# Patient Record
Sex: Female | Born: 1950 | Race: White | Hispanic: No | Marital: Married
Health system: Midwestern US, Academic
[De-identification: ages and names within clinical notes are randomized; demographics above are authoritative.]

## PROBLEM LIST (undated history)

## (undated) DIAGNOSIS — G4733 Obstructive sleep apnea (adult) (pediatric): Secondary | ICD-10-CM

## (undated) DIAGNOSIS — J302 Other seasonal allergic rhinitis: Secondary | ICD-10-CM

## (undated) DIAGNOSIS — Z923 Personal history of irradiation: Secondary | ICD-10-CM

## (undated) DIAGNOSIS — Z531 Procedure and treatment not carried out because of patient's decision for reasons of belief and group pressure: Secondary | ICD-10-CM

## (undated) DIAGNOSIS — M545 Low back pain, unspecified: Secondary | ICD-10-CM

## (undated) DIAGNOSIS — Z9221 Personal history of antineoplastic chemotherapy: Secondary | ICD-10-CM

## (undated) DIAGNOSIS — Z9989 Dependence on other enabling machines and devices: Secondary | ICD-10-CM

## (undated) DIAGNOSIS — E669 Obesity, unspecified: Secondary | ICD-10-CM

## (undated) DIAGNOSIS — I10 Essential (primary) hypertension: Secondary | ICD-10-CM

## (undated) DIAGNOSIS — G8929 Other chronic pain: Secondary | ICD-10-CM

## (undated) DIAGNOSIS — I5032 Chronic diastolic (congestive) heart failure: Secondary | ICD-10-CM

## (undated) DIAGNOSIS — I2510 Atherosclerotic heart disease of native coronary artery without angina pectoris: Secondary | ICD-10-CM

## (undated) DIAGNOSIS — E119 Type 2 diabetes mellitus without complications: Secondary | ICD-10-CM

## (undated) DIAGNOSIS — I872 Venous insufficiency (chronic) (peripheral): Secondary | ICD-10-CM

## (undated) DIAGNOSIS — IMO0001 Reserved for inherently not codable concepts without codable children: Secondary | ICD-10-CM

## (undated) DIAGNOSIS — R011 Cardiac murmur, unspecified: Secondary | ICD-10-CM

## (undated) DIAGNOSIS — M1990 Unspecified osteoarthritis, unspecified site: Secondary | ICD-10-CM

## (undated) DIAGNOSIS — H269 Unspecified cataract: Secondary | ICD-10-CM

## (undated) DIAGNOSIS — D509 Iron deficiency anemia, unspecified: Secondary | ICD-10-CM

## (undated) DIAGNOSIS — K219 Gastro-esophageal reflux disease without esophagitis: Secondary | ICD-10-CM

## (undated) DIAGNOSIS — C50919 Malignant neoplasm of unspecified site of unspecified female breast: Secondary | ICD-10-CM

## (undated) DIAGNOSIS — K635 Polyp of colon: Secondary | ICD-10-CM

## (undated) DIAGNOSIS — E785 Hyperlipidemia, unspecified: Secondary | ICD-10-CM

## (undated) HISTORY — DX: Gastro-esophageal reflux disease without esophagitis: K21.9

## (undated) HISTORY — DX: Unspecified cataract: H26.9

## (undated) HISTORY — DX: Chronic diastolic (congestive) heart failure: I50.32

## (undated) HISTORY — DX: Other seasonal allergic rhinitis: J30.2

## (undated) HISTORY — DX: Obesity, unspecified: E66.9

## (undated) HISTORY — DX: Iron deficiency anemia, unspecified: D50.9

## (undated) HISTORY — DX: Hyperlipidemia, unspecified: E78.5

## (undated) HISTORY — DX: Malignant neoplasm of unspecified site of unspecified female breast: C50.919

## (undated) HISTORY — PX: COLONOSCOPY W/ BIOPSIES AND POLYPECTOMY: SHX1376

## (undated) HISTORY — DX: Type 2 diabetes mellitus without complications: E11.9

## (undated) HISTORY — DX: Other chronic pain: G89.29

## (undated) HISTORY — DX: Obstructive sleep apnea (adult) (pediatric): G47.33

## (undated) HISTORY — DX: Venous insufficiency (chronic) (peripheral): I87.2

## (undated) HISTORY — DX: Polyp of colon: K63.5

## (undated) HISTORY — DX: Unspecified osteoarthritis, unspecified site: M19.90

---
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
## 1983-10-05 HISTORY — PX: TOTAL ABDOMINAL HYSTERECTOMY: SHX209

## 1993-10-04 DIAGNOSIS — K635 Polyp of colon: Secondary | ICD-10-CM

## 1993-10-04 HISTORY — DX: Polyp of colon: K63.5

## 1999-03-17 ENCOUNTER — Ambulatory Visit (HOSPITAL_COMMUNITY): Admission: RE | Admit: 1999-03-17 | Discharge: 1999-03-17 | Payer: Self-pay | Admitting: *Deleted

## 1999-08-25 ENCOUNTER — Ambulatory Visit (HOSPITAL_COMMUNITY): Admission: RE | Admit: 1999-08-25 | Discharge: 1999-08-25 | Payer: Self-pay | Admitting: Cardiology

## 1999-08-26 ENCOUNTER — Encounter: Payer: Self-pay | Admitting: Cardiology

## 2002-06-07 ENCOUNTER — Encounter: Admission: RE | Admit: 2002-06-07 | Discharge: 2002-07-17 | Payer: Self-pay | Admitting: Cardiology

## 2002-09-28 ENCOUNTER — Encounter: Admission: RE | Admit: 2002-09-28 | Discharge: 2002-09-28 | Payer: Self-pay | Admitting: Internal Medicine

## 2002-09-28 ENCOUNTER — Encounter: Payer: Self-pay | Admitting: Internal Medicine

## 2002-10-14 ENCOUNTER — Emergency Department (HOSPITAL_COMMUNITY): Admission: EM | Admit: 2002-10-14 | Discharge: 2002-10-14 | Payer: Self-pay | Admitting: Emergency Medicine

## 2003-06-29 ENCOUNTER — Emergency Department (HOSPITAL_COMMUNITY): Admission: EM | Admit: 2003-06-29 | Discharge: 2003-06-29 | Payer: Self-pay

## 2003-09-10 ENCOUNTER — Ambulatory Visit (HOSPITAL_COMMUNITY): Admission: RE | Admit: 2003-09-10 | Discharge: 2003-09-10 | Payer: Self-pay | Admitting: Gastroenterology

## 2004-01-22 ENCOUNTER — Encounter: Admission: RE | Admit: 2004-01-22 | Discharge: 2004-01-22 | Payer: Self-pay | Admitting: Family Medicine

## 2004-10-04 DIAGNOSIS — C50919 Malignant neoplasm of unspecified site of unspecified female breast: Secondary | ICD-10-CM

## 2004-10-04 DIAGNOSIS — E1165 Type 2 diabetes mellitus with hyperglycemia: Secondary | ICD-10-CM

## 2004-10-04 DIAGNOSIS — E11319 Type 2 diabetes mellitus with unspecified diabetic retinopathy without macular edema: Secondary | ICD-10-CM

## 2004-10-04 HISTORY — PX: BREAST LUMPECTOMY: SHX2

## 2004-10-04 HISTORY — DX: Malignant neoplasm of unspecified site of unspecified female breast: C50.919

## 2004-10-04 HISTORY — PX: BREAST LUMPECTOMY W/ NEEDLE LOCALIZATION: SHX1266

## 2005-01-29 ENCOUNTER — Encounter: Admission: RE | Admit: 2005-01-29 | Discharge: 2005-01-29 | Payer: Self-pay | Admitting: Family Medicine

## 2005-02-16 ENCOUNTER — Encounter (INDEPENDENT_AMBULATORY_CARE_PROVIDER_SITE_OTHER): Payer: Self-pay | Admitting: Radiology

## 2005-02-16 ENCOUNTER — Encounter (INDEPENDENT_AMBULATORY_CARE_PROVIDER_SITE_OTHER): Payer: Self-pay | Admitting: *Deleted

## 2005-02-16 ENCOUNTER — Encounter: Admission: RE | Admit: 2005-02-16 | Discharge: 2005-02-16 | Payer: Self-pay | Admitting: Family Medicine

## 2005-03-05 ENCOUNTER — Encounter (HOSPITAL_COMMUNITY): Admission: RE | Admit: 2005-03-05 | Discharge: 2005-03-05 | Payer: Self-pay

## 2005-03-19 ENCOUNTER — Ambulatory Visit (HOSPITAL_BASED_OUTPATIENT_CLINIC_OR_DEPARTMENT_OTHER): Admission: RE | Admit: 2005-03-19 | Discharge: 2005-03-19 | Payer: Self-pay

## 2005-03-19 ENCOUNTER — Encounter: Admission: RE | Admit: 2005-03-19 | Discharge: 2005-03-19 | Payer: Self-pay

## 2005-03-19 ENCOUNTER — Encounter (INDEPENDENT_AMBULATORY_CARE_PROVIDER_SITE_OTHER): Payer: Self-pay | Admitting: *Deleted

## 2005-03-19 ENCOUNTER — Ambulatory Visit (HOSPITAL_COMMUNITY): Admission: RE | Admit: 2005-03-19 | Discharge: 2005-03-19 | Payer: Self-pay

## 2005-03-19 ENCOUNTER — Encounter (INDEPENDENT_AMBULATORY_CARE_PROVIDER_SITE_OTHER): Payer: Self-pay

## 2005-04-12 ENCOUNTER — Ambulatory Visit: Payer: Self-pay | Admitting: Oncology

## 2005-05-12 ENCOUNTER — Ambulatory Visit: Admission: RE | Admit: 2005-05-12 | Discharge: 2005-05-12 | Payer: Self-pay

## 2005-06-01 ENCOUNTER — Ambulatory Visit: Admission: RE | Admit: 2005-06-01 | Discharge: 2005-06-22 | Payer: Self-pay | Admitting: Radiation Oncology

## 2005-06-25 ENCOUNTER — Ambulatory Visit: Admission: RE | Admit: 2005-06-25 | Discharge: 2005-07-01 | Payer: Self-pay | Admitting: Radiation Oncology

## 2005-07-06 ENCOUNTER — Ambulatory Visit: Payer: Self-pay | Admitting: Oncology

## 2005-07-08 ENCOUNTER — Ambulatory Visit: Admission: RE | Admit: 2005-07-08 | Discharge: 2005-09-08 | Payer: Self-pay | Admitting: Radiation Oncology

## 2005-09-30 ENCOUNTER — Emergency Department (HOSPITAL_COMMUNITY): Admission: EM | Admit: 2005-09-30 | Discharge: 2005-09-30 | Payer: Self-pay | Admitting: Emergency Medicine

## 2005-10-11 ENCOUNTER — Ambulatory Visit: Payer: Self-pay | Admitting: Internal Medicine

## 2005-10-25 ENCOUNTER — Ambulatory Visit: Payer: Self-pay | Admitting: Family Medicine

## 2005-11-08 ENCOUNTER — Ambulatory Visit: Payer: Self-pay | Admitting: Family Medicine

## 2005-11-08 ENCOUNTER — Ambulatory Visit: Payer: Self-pay | Admitting: *Deleted

## 2005-12-01 ENCOUNTER — Ambulatory Visit: Payer: Self-pay | Admitting: Family Medicine

## 2005-12-06 ENCOUNTER — Ambulatory Visit: Payer: Self-pay | Admitting: Family Medicine

## 2005-12-07 ENCOUNTER — Ambulatory Visit: Payer: Self-pay | Admitting: Family Medicine

## 2005-12-09 ENCOUNTER — Ambulatory Visit: Payer: Self-pay | Admitting: Family Medicine

## 2005-12-15 ENCOUNTER — Ambulatory Visit: Payer: Self-pay | Admitting: Family Medicine

## 2005-12-28 ENCOUNTER — Ambulatory Visit: Payer: Self-pay | Admitting: Family Medicine

## 2006-01-03 ENCOUNTER — Ambulatory Visit: Payer: Self-pay | Admitting: Family Medicine

## 2006-01-20 ENCOUNTER — Encounter: Admission: RE | Admit: 2006-01-20 | Discharge: 2006-01-20 | Payer: Self-pay

## 2006-01-24 ENCOUNTER — Ambulatory Visit: Payer: Self-pay | Admitting: Internal Medicine

## 2006-03-02 ENCOUNTER — Encounter: Admission: RE | Admit: 2006-03-02 | Discharge: 2006-03-30 | Payer: Self-pay | Admitting: Internal Medicine

## 2006-03-04 ENCOUNTER — Ambulatory Visit: Payer: Self-pay | Admitting: Family Medicine

## 2006-03-28 ENCOUNTER — Ambulatory Visit: Payer: Self-pay | Admitting: Family Medicine

## 2006-03-30 ENCOUNTER — Ambulatory Visit: Admission: RE | Admit: 2006-03-30 | Discharge: 2006-05-25 | Payer: Self-pay | Admitting: Radiation Oncology

## 2006-04-25 ENCOUNTER — Ambulatory Visit: Payer: Self-pay | Admitting: Family Medicine

## 2006-08-04 DIAGNOSIS — I2510 Atherosclerotic heart disease of native coronary artery without angina pectoris: Secondary | ICD-10-CM

## 2006-08-04 HISTORY — DX: Atherosclerotic heart disease of native coronary artery without angina pectoris: I25.10

## 2006-08-30 ENCOUNTER — Inpatient Hospital Stay (HOSPITAL_COMMUNITY): Admission: EM | Admit: 2006-08-30 | Discharge: 2006-09-02 | Payer: Self-pay | Admitting: Emergency Medicine

## 2006-08-31 ENCOUNTER — Ambulatory Visit: Payer: Self-pay | Admitting: Cardiovascular Disease

## 2006-08-31 ENCOUNTER — Encounter: Payer: Self-pay | Admitting: Cardiovascular Disease

## 2006-09-07 ENCOUNTER — Ambulatory Visit (HOSPITAL_COMMUNITY): Admission: RE | Admit: 2006-09-07 | Discharge: 2006-09-07 | Payer: Self-pay | Admitting: Cardiology

## 2006-09-07 ENCOUNTER — Encounter: Payer: Self-pay | Admitting: Vascular Surgery

## 2006-09-15 ENCOUNTER — Ambulatory Visit: Payer: Self-pay | Admitting: Cardiology

## 2006-09-20 ENCOUNTER — Encounter (INDEPENDENT_AMBULATORY_CARE_PROVIDER_SITE_OTHER): Payer: Self-pay | Admitting: *Deleted

## 2006-09-20 ENCOUNTER — Ambulatory Visit: Payer: Self-pay | Admitting: Internal Medicine

## 2006-09-20 ENCOUNTER — Ambulatory Visit (HOSPITAL_COMMUNITY): Admission: RE | Admit: 2006-09-20 | Discharge: 2006-09-20 | Payer: Self-pay | Admitting: Cardiology

## 2006-09-20 LAB — CONVERTED CEMR LAB
AST: 15 units/L (ref 0–37)
Albumin: 4.5 g/dL (ref 3.5–5.2)
Alkaline Phosphatase: 78 units/L (ref 39–117)
Bilirubin Urine: NEGATIVE
Glucose, Bld: 159 mg/dL — ABNORMAL HIGH (ref 70–99)
Hemoglobin, Urine: NEGATIVE
Ketones, ur: NEGATIVE mg/dL
Microalb, Ur: 0.94 mg/dL (ref 0.00–1.89)
Potassium: 3.6 meq/L (ref 3.5–5.3)
Protein, ur: NEGATIVE mg/dL
Sodium: 142 meq/L (ref 135–145)
Total Protein: 7.9 g/dL (ref 6.0–8.3)
Urobilinogen, UA: 0.2 (ref 0.0–1.0)

## 2006-10-06 ENCOUNTER — Ambulatory Visit: Payer: Self-pay | Admitting: Hospitalist

## 2006-10-26 ENCOUNTER — Telehealth (INDEPENDENT_AMBULATORY_CARE_PROVIDER_SITE_OTHER): Payer: Self-pay | Admitting: *Deleted

## 2006-10-31 ENCOUNTER — Telehealth (INDEPENDENT_AMBULATORY_CARE_PROVIDER_SITE_OTHER): Payer: Self-pay | Admitting: *Deleted

## 2006-11-01 ENCOUNTER — Ambulatory Visit: Payer: Self-pay | Admitting: Cardiology

## 2006-11-07 ENCOUNTER — Ambulatory Visit: Payer: Self-pay | Admitting: Internal Medicine

## 2006-11-11 ENCOUNTER — Ambulatory Visit: Payer: Self-pay | Admitting: Internal Medicine

## 2006-11-11 ENCOUNTER — Encounter (INDEPENDENT_AMBULATORY_CARE_PROVIDER_SITE_OTHER): Payer: Self-pay | Admitting: *Deleted

## 2006-11-11 ENCOUNTER — Ambulatory Visit (HOSPITAL_COMMUNITY): Admission: RE | Admit: 2006-11-11 | Discharge: 2006-11-11 | Payer: Self-pay | Admitting: Internal Medicine

## 2006-11-11 LAB — CONVERTED CEMR LAB: Blood Glucose, Fingerstick: 203

## 2006-11-16 ENCOUNTER — Encounter (INDEPENDENT_AMBULATORY_CARE_PROVIDER_SITE_OTHER): Payer: Self-pay | Admitting: *Deleted

## 2006-11-25 ENCOUNTER — Encounter: Admission: RE | Admit: 2006-11-25 | Discharge: 2006-11-25 | Payer: Self-pay | Admitting: *Deleted

## 2006-12-06 ENCOUNTER — Telehealth (INDEPENDENT_AMBULATORY_CARE_PROVIDER_SITE_OTHER): Payer: Self-pay | Admitting: *Deleted

## 2006-12-20 ENCOUNTER — Telehealth: Payer: Self-pay | Admitting: *Deleted

## 2006-12-22 ENCOUNTER — Encounter (INDEPENDENT_AMBULATORY_CARE_PROVIDER_SITE_OTHER): Payer: Self-pay | Admitting: Internal Medicine

## 2006-12-22 ENCOUNTER — Ambulatory Visit: Payer: Self-pay | Admitting: Internal Medicine

## 2006-12-22 LAB — CONVERTED CEMR LAB
INR: 7.1
TSH: 2.783 microintl units/mL (ref 0.350–5.50)

## 2007-01-13 ENCOUNTER — Ambulatory Visit: Payer: Self-pay | Admitting: Internal Medicine

## 2007-01-13 ENCOUNTER — Encounter (INDEPENDENT_AMBULATORY_CARE_PROVIDER_SITE_OTHER): Payer: Self-pay | Admitting: *Deleted

## 2007-01-13 LAB — CONVERTED CEMR LAB
AST: 21 units/L (ref 0–37)
Alkaline Phosphatase: 68 units/L (ref 39–117)
BUN: 18 mg/dL (ref 6–23)
Calcium: 9.7 mg/dL (ref 8.4–10.5)
Creatinine, Ser: 1.02 mg/dL (ref 0.40–1.20)

## 2007-01-20 ENCOUNTER — Encounter (INDEPENDENT_AMBULATORY_CARE_PROVIDER_SITE_OTHER): Payer: Self-pay | Admitting: *Deleted

## 2007-01-20 ENCOUNTER — Ambulatory Visit: Payer: Self-pay | Admitting: Internal Medicine

## 2007-01-20 LAB — CONVERTED CEMR LAB
BUN: 13 mg/dL (ref 6–23)
CO2: 25 meq/L (ref 19–32)
Chloride: 107 meq/L (ref 96–112)
Creatinine, Ser: 0.77 mg/dL (ref 0.40–1.20)
HDL: 57 mg/dL (ref >39)
LDL Cholesterol: 119 mg/dL — ABNORMAL HIGH (ref 0–99)
Potassium: 4.2 meq/L (ref 3.5–5.3)
VLDL: 15 mg/dL (ref 0–40)

## 2007-01-24 ENCOUNTER — Telehealth (INDEPENDENT_AMBULATORY_CARE_PROVIDER_SITE_OTHER): Payer: Self-pay | Admitting: *Deleted

## 2007-01-27 ENCOUNTER — Ambulatory Visit: Payer: Self-pay | Admitting: Cardiology

## 2007-01-27 ENCOUNTER — Encounter: Payer: Self-pay | Admitting: Cardiology

## 2007-01-27 ENCOUNTER — Ambulatory Visit (HOSPITAL_COMMUNITY): Admission: RE | Admit: 2007-01-27 | Discharge: 2007-01-27 | Payer: Self-pay | Admitting: Internal Medicine

## 2007-02-03 ENCOUNTER — Encounter: Payer: Self-pay | Admitting: Vascular Surgery

## 2007-02-03 ENCOUNTER — Encounter (INDEPENDENT_AMBULATORY_CARE_PROVIDER_SITE_OTHER): Payer: Self-pay | Admitting: Pulmonary Disease

## 2007-02-03 ENCOUNTER — Ambulatory Visit: Payer: Self-pay | Admitting: Vascular Surgery

## 2007-02-03 ENCOUNTER — Ambulatory Visit (HOSPITAL_COMMUNITY): Admission: RE | Admit: 2007-02-03 | Discharge: 2007-02-03 | Payer: Self-pay | Admitting: *Deleted

## 2007-02-03 ENCOUNTER — Ambulatory Visit: Payer: Self-pay | Admitting: *Deleted

## 2007-02-03 LAB — CONVERTED CEMR LAB
BUN: 16 mg/dL (ref 6–23)
Basophils Absolute: 0 10*3/uL (ref 0.0–0.1)
Basophils Relative: 0 % (ref 0–1)
Chloride: 109 meq/L (ref 96–112)
Eosinophils Absolute: 0.2 10*3/uL (ref 0.0–0.7)
Hgb A1c MFr Bld: 7.1 %
MCHC: 31.9 g/dL (ref 30.0–36.0)
Monocytes Relative: 8 % (ref 3–11)
Neutro Abs: 1.9 10*3/uL (ref 1.7–7.7)
Neutrophils Relative %: 38 % — ABNORMAL LOW (ref 43–77)
Platelets: 244 10*3/uL (ref 150–400)
Potassium: 4.1 meq/L (ref 3.5–5.3)
RBC: 4.09 M/uL (ref 3.87–5.11)
Sodium: 142 meq/L (ref 135–145)

## 2007-02-08 ENCOUNTER — Encounter: Admission: RE | Admit: 2007-02-08 | Discharge: 2007-02-08 | Payer: Self-pay | Admitting: Internal Medicine

## 2007-02-10 ENCOUNTER — Telehealth (INDEPENDENT_AMBULATORY_CARE_PROVIDER_SITE_OTHER): Payer: Self-pay | Admitting: *Deleted

## 2007-02-16 ENCOUNTER — Encounter (INDEPENDENT_AMBULATORY_CARE_PROVIDER_SITE_OTHER): Payer: Self-pay | Admitting: *Deleted

## 2007-02-16 ENCOUNTER — Ambulatory Visit (HOSPITAL_BASED_OUTPATIENT_CLINIC_OR_DEPARTMENT_OTHER): Admission: RE | Admit: 2007-02-16 | Discharge: 2007-02-16 | Payer: Self-pay | Admitting: *Deleted

## 2007-02-16 DIAGNOSIS — G4733 Obstructive sleep apnea (adult) (pediatric): Secondary | ICD-10-CM

## 2007-02-16 HISTORY — DX: Obstructive sleep apnea (adult) (pediatric): G47.33

## 2007-02-19 ENCOUNTER — Ambulatory Visit: Payer: Self-pay | Admitting: Internal Medicine

## 2007-02-24 ENCOUNTER — Encounter (INDEPENDENT_AMBULATORY_CARE_PROVIDER_SITE_OTHER): Payer: Self-pay | Admitting: Pulmonary Disease

## 2007-02-24 ENCOUNTER — Ambulatory Visit: Payer: Self-pay | Admitting: Internal Medicine

## 2007-02-24 LAB — CONVERTED CEMR LAB
AST: 19 units/L (ref 0–37)
Albumin: 4.2 g/dL (ref 3.5–5.2)
Alkaline Phosphatase: 66 units/L (ref 39–117)
BUN: 13 mg/dL (ref 6–23)
Basophils Relative: 0 % (ref 0–1)
Calcium: 9.2 mg/dL (ref 8.4–10.5)
Creatinine, Ser: 0.8 mg/dL (ref 0.40–1.20)
Eosinophils Absolute: 0.1 10*3/uL (ref 0.0–0.7)
Glucose, Bld: 78 mg/dL (ref 70–99)
HCT: 35.8 % — ABNORMAL LOW (ref 36.0–46.0)
Hemoglobin: 11.4 g/dL — ABNORMAL LOW (ref 12.0–15.0)
Lymphs Abs: 2.5 10*3/uL (ref 0.7–3.3)
MCHC: 31.8 g/dL (ref 30.0–36.0)
MCV: 86.3 fL (ref 78.0–100.0)
Monocytes Absolute: 0.5 10*3/uL (ref 0.2–0.7)
Monocytes Relative: 12 % — ABNORMAL HIGH (ref 3–11)
Potassium: 3.8 meq/L (ref 3.5–5.3)
RBC: 4.15 M/uL (ref 3.87–5.11)
WBC: 4.6 10*3/uL (ref 4.0–10.5)

## 2007-03-10 ENCOUNTER — Ambulatory Visit: Payer: Self-pay | Admitting: Internal Medicine

## 2007-03-15 ENCOUNTER — Ambulatory Visit: Payer: Self-pay | Admitting: Internal Medicine

## 2007-05-25 ENCOUNTER — Encounter (INDEPENDENT_AMBULATORY_CARE_PROVIDER_SITE_OTHER): Payer: Self-pay | Admitting: *Deleted

## 2007-05-29 ENCOUNTER — Telehealth: Payer: Self-pay | Admitting: *Deleted

## 2007-06-15 ENCOUNTER — Ambulatory Visit: Payer: Self-pay | Admitting: Internal Medicine

## 2007-06-15 ENCOUNTER — Encounter (INDEPENDENT_AMBULATORY_CARE_PROVIDER_SITE_OTHER): Payer: Self-pay | Admitting: *Deleted

## 2007-06-15 LAB — CONVERTED CEMR LAB: Blood Glucose, Fingerstick: 129

## 2007-06-22 ENCOUNTER — Ambulatory Visit: Payer: Self-pay | Admitting: Internal Medicine

## 2007-06-22 ENCOUNTER — Encounter (INDEPENDENT_AMBULATORY_CARE_PROVIDER_SITE_OTHER): Payer: Self-pay | Admitting: *Deleted

## 2007-06-27 LAB — CONVERTED CEMR LAB
CO2: 26 meq/L (ref 19–32)
Calcium: 9 mg/dL (ref 8.4–10.5)
Cholesterol: 135 mg/dL (ref 0–200)
Glucose, Bld: 112 mg/dL — ABNORMAL HIGH (ref 70–99)
HDL: 56 mg/dL (ref >39)
Potassium: 3.9 meq/L (ref 3.5–5.3)
Sodium: 142 meq/L (ref 135–145)
Total CHOL/HDL Ratio: 2.4
VLDL: 11 mg/dL (ref 0–40)

## 2007-07-13 ENCOUNTER — Telehealth: Payer: Self-pay | Admitting: *Deleted

## 2007-07-20 ENCOUNTER — Ambulatory Visit: Payer: Self-pay | Admitting: Hospitalist

## 2007-08-03 ENCOUNTER — Ambulatory Visit: Payer: Self-pay | Admitting: Internal Medicine

## 2007-08-03 ENCOUNTER — Encounter (INDEPENDENT_AMBULATORY_CARE_PROVIDER_SITE_OTHER): Payer: Self-pay | Admitting: *Deleted

## 2007-08-03 LAB — CONVERTED CEMR LAB
AST: 17 units/L (ref 0–37)
Alkaline Phosphatase: 64 units/L (ref 39–117)
BUN: 18 mg/dL (ref 6–23)
Glucose, Bld: 141 mg/dL — ABNORMAL HIGH (ref 70–99)
Potassium: 3.9 meq/L (ref 3.5–5.3)
Total Bilirubin: 0.2 mg/dL — ABNORMAL LOW (ref 0.3–1.2)

## 2007-08-14 ENCOUNTER — Encounter (INDEPENDENT_AMBULATORY_CARE_PROVIDER_SITE_OTHER): Payer: Self-pay | Admitting: *Deleted

## 2007-08-17 ENCOUNTER — Encounter (INDEPENDENT_AMBULATORY_CARE_PROVIDER_SITE_OTHER): Payer: Self-pay | Admitting: *Deleted

## 2007-08-17 ENCOUNTER — Ambulatory Visit: Payer: Self-pay | Admitting: Internal Medicine

## 2007-08-17 ENCOUNTER — Telehealth: Payer: Self-pay | Admitting: *Deleted

## 2007-08-17 LAB — CONVERTED CEMR LAB: Blood Glucose, Fingerstick: 152

## 2007-08-24 ENCOUNTER — Ambulatory Visit: Payer: Self-pay | Admitting: Vascular Surgery

## 2007-08-24 ENCOUNTER — Ambulatory Visit (HOSPITAL_COMMUNITY): Admission: RE | Admit: 2007-08-24 | Discharge: 2007-08-24 | Payer: Self-pay | Admitting: *Deleted

## 2007-08-24 ENCOUNTER — Encounter: Payer: Self-pay | Admitting: Internal Medicine

## 2007-09-06 ENCOUNTER — Telehealth: Payer: Self-pay | Admitting: *Deleted

## 2007-09-07 ENCOUNTER — Encounter (INDEPENDENT_AMBULATORY_CARE_PROVIDER_SITE_OTHER): Payer: Self-pay | Admitting: *Deleted

## 2007-09-07 ENCOUNTER — Inpatient Hospital Stay (HOSPITAL_COMMUNITY): Admission: AD | Admit: 2007-09-07 | Discharge: 2007-09-08 | Payer: Self-pay | Admitting: *Deleted

## 2007-09-07 ENCOUNTER — Ambulatory Visit: Payer: Self-pay | Admitting: *Deleted

## 2007-09-07 ENCOUNTER — Ambulatory Visit: Payer: Self-pay | Admitting: Internal Medicine

## 2007-09-11 ENCOUNTER — Telehealth (INDEPENDENT_AMBULATORY_CARE_PROVIDER_SITE_OTHER): Payer: Self-pay | Admitting: *Deleted

## 2007-09-14 ENCOUNTER — Encounter (INDEPENDENT_AMBULATORY_CARE_PROVIDER_SITE_OTHER): Payer: Self-pay | Admitting: Internal Medicine

## 2007-09-14 ENCOUNTER — Ambulatory Visit: Payer: Self-pay | Admitting: Infectious Diseases

## 2007-09-14 LAB — CONVERTED CEMR LAB
CO2: 26 meq/L (ref 19–32)
Chloride: 106 meq/L (ref 96–112)
Hgb A1c MFr Bld: 8 %
Potassium: 3.9 meq/L (ref 3.5–5.3)
Sodium: 144 meq/L (ref 135–145)

## 2007-09-21 ENCOUNTER — Ambulatory Visit: Payer: Self-pay | Admitting: Cardiology

## 2007-11-02 ENCOUNTER — Encounter (INDEPENDENT_AMBULATORY_CARE_PROVIDER_SITE_OTHER): Payer: Self-pay | Admitting: Internal Medicine

## 2007-11-02 ENCOUNTER — Ambulatory Visit: Payer: Self-pay | Admitting: Internal Medicine

## 2007-11-02 LAB — CONVERTED CEMR LAB
BUN: 16 mg/dL (ref 6–23)
CO2: 29 meq/L (ref 19–32)
Chloride: 100 meq/L (ref 96–112)
Glucose, Bld: 108 mg/dL — ABNORMAL HIGH (ref 70–99)
Potassium: 3.5 meq/L (ref 3.5–5.3)
Sodium: 141 meq/L (ref 135–145)

## 2007-11-10 ENCOUNTER — Telehealth: Payer: Self-pay | Admitting: *Deleted

## 2007-11-16 ENCOUNTER — Ambulatory Visit: Payer: Self-pay | Admitting: Internal Medicine

## 2007-11-16 ENCOUNTER — Encounter (INDEPENDENT_AMBULATORY_CARE_PROVIDER_SITE_OTHER): Payer: Self-pay | Admitting: *Deleted

## 2007-11-16 LAB — CONVERTED CEMR LAB
BUN: 16 mg/dL (ref 6–23)
CO2: 27 meq/L (ref 19–32)
Calcium: 9.7 mg/dL (ref 8.4–10.5)
Creatinine, Ser: 0.95 mg/dL (ref 0.40–1.20)
Glucose, Bld: 114 mg/dL — ABNORMAL HIGH (ref 70–99)
Sodium: 145 meq/L (ref 135–145)

## 2007-12-12 ENCOUNTER — Ambulatory Visit: Payer: Self-pay | Admitting: Hospitalist

## 2007-12-12 ENCOUNTER — Encounter (INDEPENDENT_AMBULATORY_CARE_PROVIDER_SITE_OTHER): Payer: Self-pay | Admitting: *Deleted

## 2007-12-12 ENCOUNTER — Telehealth (INDEPENDENT_AMBULATORY_CARE_PROVIDER_SITE_OTHER): Payer: Self-pay | Admitting: *Deleted

## 2007-12-12 LAB — CONVERTED CEMR LAB
AST: 29 units/L (ref 0–37)
BUN: 15 mg/dL (ref 6–23)
Blood in Urine, dipstick: NEGATIVE
Calcium: 10 mg/dL (ref 8.4–10.5)
Chloride: 98 meq/L (ref 96–112)
Creatinine, Ser: 1.01 mg/dL (ref 0.40–1.20)
Ketones, urine, test strip: NEGATIVE
Protein, U semiquant: NEGATIVE
Total Bilirubin: 0.6 mg/dL (ref 0.3–1.2)
Urobilinogen, UA: 0.2
WBC Urine, dipstick: NEGATIVE

## 2007-12-14 ENCOUNTER — Ambulatory Visit: Payer: Self-pay | Admitting: *Deleted

## 2007-12-18 ENCOUNTER — Ambulatory Visit: Payer: Self-pay | Admitting: Internal Medicine

## 2007-12-18 ENCOUNTER — Encounter (INDEPENDENT_AMBULATORY_CARE_PROVIDER_SITE_OTHER): Payer: Self-pay | Admitting: *Deleted

## 2007-12-18 LAB — CONVERTED CEMR LAB
Blood Glucose, Fingerstick: 172
Calcium: 9.7 mg/dL (ref 8.4–10.5)
Creatinine, Ser: 0.83 mg/dL (ref 0.40–1.20)
Hgb A1c MFr Bld: 9 %

## 2007-12-22 ENCOUNTER — Encounter (INDEPENDENT_AMBULATORY_CARE_PROVIDER_SITE_OTHER): Payer: Self-pay | Admitting: *Deleted

## 2007-12-28 ENCOUNTER — Ambulatory Visit: Payer: Self-pay | Admitting: *Deleted

## 2008-01-25 ENCOUNTER — Encounter: Payer: Self-pay | Admitting: Internal Medicine

## 2008-02-16 ENCOUNTER — Telehealth: Payer: Self-pay | Admitting: Infectious Disease

## 2008-02-19 ENCOUNTER — Encounter: Payer: Self-pay | Admitting: Internal Medicine

## 2008-02-19 ENCOUNTER — Ambulatory Visit: Payer: Self-pay | Admitting: *Deleted

## 2008-02-19 LAB — CONVERTED CEMR LAB
Blood Glucose, Fingerstick: 267
Streptococcus, Group A Screen (Direct): NEGATIVE

## 2008-02-23 ENCOUNTER — Telehealth: Payer: Self-pay | Admitting: *Deleted

## 2008-03-05 ENCOUNTER — Ambulatory Visit (HOSPITAL_COMMUNITY): Admission: RE | Admit: 2008-03-05 | Discharge: 2008-03-05 | Payer: Self-pay | Admitting: *Deleted

## 2008-03-05 ENCOUNTER — Ambulatory Visit: Payer: Self-pay | Admitting: *Deleted

## 2008-03-05 ENCOUNTER — Encounter (INDEPENDENT_AMBULATORY_CARE_PROVIDER_SITE_OTHER): Payer: Self-pay | Admitting: *Deleted

## 2008-03-12 ENCOUNTER — Ambulatory Visit: Payer: Self-pay | Admitting: Cardiovascular Disease

## 2008-03-13 ENCOUNTER — Telehealth (INDEPENDENT_AMBULATORY_CARE_PROVIDER_SITE_OTHER): Payer: Self-pay | Admitting: *Deleted

## 2008-03-13 ENCOUNTER — Observation Stay (HOSPITAL_COMMUNITY): Admission: EM | Admit: 2008-03-13 | Discharge: 2008-03-13 | Payer: Self-pay | Admitting: Emergency Medicine

## 2008-03-14 ENCOUNTER — Telehealth (INDEPENDENT_AMBULATORY_CARE_PROVIDER_SITE_OTHER): Payer: Self-pay | Admitting: *Deleted

## 2008-03-18 ENCOUNTER — Ambulatory Visit: Payer: Self-pay

## 2008-03-18 ENCOUNTER — Encounter: Payer: Self-pay | Admitting: Internal Medicine

## 2008-04-11 ENCOUNTER — Telehealth: Payer: Self-pay | Admitting: *Deleted

## 2008-04-12 ENCOUNTER — Telehealth (INDEPENDENT_AMBULATORY_CARE_PROVIDER_SITE_OTHER): Payer: Self-pay | Admitting: *Deleted

## 2008-04-18 ENCOUNTER — Ambulatory Visit: Payer: Self-pay | Admitting: Infectious Diseases

## 2008-04-18 ENCOUNTER — Encounter: Payer: Self-pay | Admitting: Internal Medicine

## 2008-04-18 LAB — CONVERTED CEMR LAB
ALT: 16 units/L (ref 0–35)
AST: 14 units/L (ref 0–37)
Blood Glucose, Fingerstick: 157
Chloride: 107 meq/L (ref 96–112)
Creatinine, Ser: 0.96 mg/dL (ref 0.40–1.20)
Sodium: 143 meq/L (ref 135–145)
Total Bilirubin: 0.3 mg/dL (ref 0.3–1.2)
Total Protein: 7.7 g/dL (ref 6.0–8.3)

## 2008-05-03 ENCOUNTER — Ambulatory Visit: Payer: Self-pay | Admitting: Internal Medicine

## 2008-05-03 ENCOUNTER — Encounter: Payer: Self-pay | Admitting: Internal Medicine

## 2008-06-04 ENCOUNTER — Telehealth: Payer: Self-pay | Admitting: Internal Medicine

## 2008-07-15 ENCOUNTER — Telehealth: Payer: Self-pay | Admitting: *Deleted

## 2008-08-01 ENCOUNTER — Telehealth: Payer: Self-pay | Admitting: *Deleted

## 2008-08-09 ENCOUNTER — Encounter: Payer: Self-pay | Admitting: Internal Medicine

## 2008-08-09 ENCOUNTER — Ambulatory Visit: Payer: Self-pay | Admitting: Internal Medicine

## 2008-08-09 LAB — CONVERTED CEMR LAB
Blood Glucose, Fingerstick: 115
Hgb A1c MFr Bld: 8.2 %

## 2008-08-15 ENCOUNTER — Encounter: Payer: Self-pay | Admitting: Internal Medicine

## 2008-08-15 ENCOUNTER — Ambulatory Visit: Payer: Self-pay | Admitting: *Deleted

## 2008-08-15 ENCOUNTER — Ambulatory Visit (HOSPITAL_COMMUNITY): Admission: RE | Admit: 2008-08-15 | Discharge: 2008-08-15 | Payer: Self-pay | Admitting: Infectious Diseases

## 2008-08-15 LAB — CONVERTED CEMR LAB
CO2: 23 meq/L (ref 19–32)
Chloride: 108 meq/L (ref 96–112)
Potassium: 3.9 meq/L (ref 3.5–5.3)
Vitamin B-12: 1067 pg/mL — ABNORMAL HIGH (ref 211–911)

## 2008-08-20 ENCOUNTER — Telehealth: Payer: Self-pay | Admitting: *Deleted

## 2008-09-03 ENCOUNTER — Telehealth (INDEPENDENT_AMBULATORY_CARE_PROVIDER_SITE_OTHER): Payer: Self-pay | Admitting: *Deleted

## 2008-09-12 ENCOUNTER — Telehealth: Payer: Self-pay | Admitting: *Deleted

## 2008-09-18 ENCOUNTER — Telehealth (INDEPENDENT_AMBULATORY_CARE_PROVIDER_SITE_OTHER): Payer: Self-pay | Admitting: *Deleted

## 2008-09-30 ENCOUNTER — Telehealth (INDEPENDENT_AMBULATORY_CARE_PROVIDER_SITE_OTHER): Payer: Self-pay | Admitting: *Deleted

## 2008-10-28 ENCOUNTER — Encounter: Payer: Self-pay | Admitting: Internal Medicine

## 2008-11-01 ENCOUNTER — Encounter: Payer: Self-pay | Admitting: Internal Medicine

## 2008-11-01 ENCOUNTER — Ambulatory Visit: Payer: Self-pay | Admitting: Internal Medicine

## 2008-11-01 LAB — CONVERTED CEMR LAB
ALT: 20 units/L (ref 0–35)
Alkaline Phosphatase: 77 units/L (ref 39–117)
Bilirubin Urine: NEGATIVE
Blood in Urine, dipstick: NEGATIVE
CO2: 24 meq/L (ref 19–32)
Cholesterol: 123 mg/dL (ref 0–200)
Creatinine, Ser: 0.72 mg/dL (ref 0.40–1.20)
Creatinine, Urine: 126.2 mg/dL
Glucose, Urine, Semiquant: NEGATIVE
Ketones, urine, test strip: NEGATIVE
Microalb Creat Ratio: 6.8 mg/g (ref 0.0–30.0)
Microalb, Ur: 0.86 mg/dL (ref 0.00–1.89)
Total Bilirubin: 0.2 mg/dL — ABNORMAL LOW (ref 0.3–1.2)
Total CHOL/HDL Ratio: 2.1
Urobilinogen, UA: 0.2
VLDL: 18 mg/dL (ref 0–40)
WBC Urine, dipstick: NEGATIVE

## 2008-11-08 ENCOUNTER — Telehealth (INDEPENDENT_AMBULATORY_CARE_PROVIDER_SITE_OTHER): Payer: Self-pay | Admitting: *Deleted

## 2008-11-18 ENCOUNTER — Telehealth: Payer: Self-pay | Admitting: Internal Medicine

## 2008-11-19 ENCOUNTER — Encounter: Payer: Self-pay | Admitting: Internal Medicine

## 2008-12-10 ENCOUNTER — Encounter: Payer: Self-pay | Admitting: Internal Medicine

## 2008-12-10 ENCOUNTER — Ambulatory Visit: Payer: Self-pay | Admitting: Internal Medicine

## 2008-12-17 ENCOUNTER — Telehealth: Payer: Self-pay | Admitting: Internal Medicine

## 2008-12-24 ENCOUNTER — Ambulatory Visit: Payer: Self-pay | Admitting: Internal Medicine

## 2008-12-25 ENCOUNTER — Telehealth (INDEPENDENT_AMBULATORY_CARE_PROVIDER_SITE_OTHER): Payer: Self-pay | Admitting: *Deleted

## 2008-12-26 ENCOUNTER — Encounter: Payer: Self-pay | Admitting: Internal Medicine

## 2009-01-02 ENCOUNTER — Telehealth: Payer: Self-pay | Admitting: *Deleted

## 2009-01-07 ENCOUNTER — Encounter: Payer: Self-pay | Admitting: Internal Medicine

## 2009-01-07 ENCOUNTER — Ambulatory Visit: Payer: Self-pay | Admitting: Internal Medicine

## 2009-01-31 ENCOUNTER — Encounter: Payer: Self-pay | Admitting: Internal Medicine

## 2009-02-13 ENCOUNTER — Telehealth: Payer: Self-pay | Admitting: *Deleted

## 2009-02-26 ENCOUNTER — Encounter: Payer: Self-pay | Admitting: Internal Medicine

## 2009-02-26 LAB — HM DIABETES EYE EXAM

## 2009-03-11 ENCOUNTER — Telehealth: Payer: Self-pay | Admitting: Internal Medicine

## 2009-03-25 ENCOUNTER — Ambulatory Visit (HOSPITAL_COMMUNITY): Admission: RE | Admit: 2009-03-25 | Discharge: 2009-03-25 | Payer: Self-pay | Admitting: Internal Medicine

## 2009-03-25 ENCOUNTER — Encounter: Payer: Self-pay | Admitting: Internal Medicine

## 2009-03-25 ENCOUNTER — Ambulatory Visit: Payer: Self-pay | Admitting: Internal Medicine

## 2009-03-25 LAB — CONVERTED CEMR LAB
BUN: 17 mg/dL (ref 6–23)
Basophils Absolute: 0 10*3/uL (ref 0.0–0.1)
Basophils Relative: 0 % (ref 0–1)
CO2: 24 meq/L (ref 19–32)
Chloride: 109 meq/L (ref 96–112)
Creatinine, Ser: 0.93 mg/dL (ref 0.40–1.20)
Eosinophils Absolute: 0.1 10*3/uL (ref 0.0–0.7)
Eosinophils Relative: 2 % (ref 0–5)
GFR calc non Af Amer: >60 mL/min (ref >60)
HCT: 35.7 % — ABNORMAL LOW (ref 36.0–46.0)
Hgb A1c MFr Bld: 8.4 %
MCV: 84.1 fL (ref 78.0–100.0)
Platelets: 268 10*3/uL (ref 150–400)
RDW: 17.3 % — ABNORMAL HIGH (ref 11.5–15.5)

## 2009-04-01 ENCOUNTER — Encounter: Admission: RE | Admit: 2009-04-01 | Discharge: 2009-04-01 | Payer: Self-pay | Admitting: Internal Medicine

## 2009-04-01 ENCOUNTER — Ambulatory Visit: Payer: Self-pay | Admitting: Internal Medicine

## 2009-04-01 LAB — FECAL OCCULT BLOOD, GUAIAC: Fecal Occult Blood: POSITIVE

## 2009-04-02 ENCOUNTER — Telehealth: Payer: Self-pay

## 2009-04-08 ENCOUNTER — Encounter: Payer: Self-pay | Admitting: Internal Medicine

## 2009-04-10 ENCOUNTER — Ambulatory Visit: Payer: Self-pay | Admitting: Internal Medicine

## 2009-04-10 ENCOUNTER — Encounter: Payer: Self-pay | Admitting: Internal Medicine

## 2009-04-10 LAB — CONVERTED CEMR LAB
Microalb Creat Ratio: 21.6 mg/g (ref 0.0–30.0)
Sed Rate: 36 mm/hr — ABNORMAL HIGH (ref 0–22)

## 2009-04-15 ENCOUNTER — Encounter: Payer: Self-pay | Admitting: Internal Medicine

## 2009-04-15 ENCOUNTER — Ambulatory Visit (HOSPITAL_COMMUNITY): Admission: RE | Admit: 2009-04-15 | Discharge: 2009-04-15 | Payer: Self-pay | Admitting: Internal Medicine

## 2009-04-22 ENCOUNTER — Telehealth (INDEPENDENT_AMBULATORY_CARE_PROVIDER_SITE_OTHER): Payer: Self-pay | Admitting: *Deleted

## 2009-04-29 ENCOUNTER — Ambulatory Visit: Payer: Self-pay | Admitting: Internal Medicine

## 2009-04-29 LAB — CONVERTED CEMR LAB: Blood Glucose, Fingerstick: 199

## 2009-05-06 ENCOUNTER — Telehealth: Payer: Self-pay | Admitting: Internal Medicine

## 2009-07-15 ENCOUNTER — Telehealth: Payer: Self-pay | Admitting: Internal Medicine

## 2009-07-21 ENCOUNTER — Ambulatory Visit: Payer: Self-pay | Admitting: Internal Medicine

## 2009-07-21 ENCOUNTER — Encounter: Payer: Self-pay | Admitting: Internal Medicine

## 2009-07-21 LAB — CONVERTED CEMR LAB
Blood Glucose, Fingerstick: 299
Hgb A1c MFr Bld: 9 %

## 2009-07-22 ENCOUNTER — Ambulatory Visit: Payer: Self-pay | Admitting: Internal Medicine

## 2009-07-22 ENCOUNTER — Encounter: Payer: Self-pay | Admitting: Internal Medicine

## 2009-07-22 LAB — CONVERTED CEMR LAB
AST: 18 units/L (ref 0–37)
Albumin: 4.2 g/dL (ref 3.5–5.2)
Alkaline Phosphatase: 69 units/L (ref 39–117)
BUN: 22 mg/dL (ref 6–23)
Calcium: 10 mg/dL (ref 8.4–10.5)
Cholesterol: 133 mg/dL (ref 0–200)
Creatinine, Ser: 1.03 mg/dL (ref 0.40–1.20)
HDL: 53 mg/dL (ref >39)
Indirect Bilirubin: 0.2 mg/dL (ref 0.0–0.9)
Total Protein: 7.8 g/dL (ref 6.0–8.3)
Triglycerides: 78 mg/dL (ref <150)

## 2009-09-02 ENCOUNTER — Telehealth: Payer: Self-pay | Admitting: *Deleted

## 2009-09-05 ENCOUNTER — Telehealth: Payer: Self-pay | Admitting: Internal Medicine

## 2009-09-08 ENCOUNTER — Emergency Department (HOSPITAL_COMMUNITY): Admission: EM | Admit: 2009-09-08 | Discharge: 2009-09-09 | Payer: Self-pay | Admitting: Emergency Medicine

## 2009-09-12 ENCOUNTER — Ambulatory Visit: Payer: Self-pay | Admitting: Infectious Diseases

## 2009-09-12 ENCOUNTER — Encounter: Payer: Self-pay | Admitting: Internal Medicine

## 2009-09-22 ENCOUNTER — Telehealth: Payer: Self-pay | Admitting: Internal Medicine

## 2009-09-24 ENCOUNTER — Telehealth: Payer: Self-pay | Admitting: *Deleted

## 2009-09-25 ENCOUNTER — Telehealth (INDEPENDENT_AMBULATORY_CARE_PROVIDER_SITE_OTHER): Payer: Self-pay | Admitting: *Deleted

## 2009-09-30 ENCOUNTER — Telehealth: Payer: Self-pay | Admitting: *Deleted

## 2009-10-01 ENCOUNTER — Telehealth: Payer: Self-pay | Admitting: *Deleted

## 2009-10-07 ENCOUNTER — Telehealth: Payer: Self-pay | Admitting: *Deleted

## 2009-10-08 ENCOUNTER — Telehealth: Payer: Self-pay | Admitting: *Deleted

## 2009-10-24 ENCOUNTER — Telehealth: Payer: Self-pay | Admitting: Internal Medicine

## 2009-10-28 ENCOUNTER — Ambulatory Visit: Payer: Self-pay | Admitting: Internal Medicine

## 2009-10-31 ENCOUNTER — Telehealth: Payer: Self-pay | Admitting: Internal Medicine

## 2009-11-03 ENCOUNTER — Telehealth: Payer: Self-pay | Admitting: Internal Medicine

## 2009-11-05 ENCOUNTER — Telehealth: Payer: Self-pay | Admitting: *Deleted

## 2009-11-25 ENCOUNTER — Encounter: Payer: Self-pay | Admitting: Internal Medicine

## 2009-12-04 ENCOUNTER — Ambulatory Visit: Payer: Self-pay | Admitting: Internal Medicine

## 2009-12-04 LAB — CONVERTED CEMR LAB
CO2: 22 meq/L (ref 19–32)
Calcium: 9.3 mg/dL (ref 8.4–10.5)
Chloride: 105 meq/L (ref 96–112)
Creatinine, Ser: 0.8 mg/dL (ref 0.40–1.20)
Glucose, Bld: 157 mg/dL — ABNORMAL HIGH (ref 70–99)

## 2009-12-12 ENCOUNTER — Telehealth: Payer: Self-pay | Admitting: *Deleted

## 2009-12-12 ENCOUNTER — Encounter: Payer: Self-pay | Admitting: Internal Medicine

## 2009-12-12 ENCOUNTER — Encounter (INDEPENDENT_AMBULATORY_CARE_PROVIDER_SITE_OTHER): Payer: Self-pay | Admitting: Internal Medicine

## 2009-12-12 ENCOUNTER — Encounter (INDEPENDENT_AMBULATORY_CARE_PROVIDER_SITE_OTHER): Payer: Self-pay | Admitting: *Deleted

## 2009-12-12 ENCOUNTER — Ambulatory Visit: Payer: Self-pay | Admitting: Internal Medicine

## 2009-12-18 ENCOUNTER — Ambulatory Visit: Payer: Self-pay | Admitting: Internal Medicine

## 2009-12-18 ENCOUNTER — Encounter: Payer: Self-pay | Admitting: Internal Medicine

## 2010-01-15 ENCOUNTER — Telehealth: Payer: Self-pay | Admitting: Internal Medicine

## 2010-01-30 ENCOUNTER — Ambulatory Visit: Payer: Self-pay | Admitting: Internal Medicine

## 2010-02-17 ENCOUNTER — Ambulatory Visit: Payer: Self-pay | Admitting: Infectious Disease

## 2010-04-29 ENCOUNTER — Ambulatory Visit: Payer: Self-pay | Admitting: Internal Medicine

## 2010-04-29 LAB — CONVERTED CEMR LAB: Blood Glucose, AC Bkfst: 114 mg/dL

## 2010-05-07 ENCOUNTER — Telehealth: Payer: Self-pay | Admitting: Internal Medicine

## 2010-05-14 ENCOUNTER — Encounter: Payer: Self-pay | Admitting: Internal Medicine

## 2010-06-23 ENCOUNTER — Telehealth (INDEPENDENT_AMBULATORY_CARE_PROVIDER_SITE_OTHER): Payer: Self-pay | Admitting: *Deleted

## 2010-06-23 ENCOUNTER — Ambulatory Visit: Payer: Self-pay | Admitting: Internal Medicine

## 2010-06-23 LAB — CONVERTED CEMR LAB
Blood in Urine, dipstick: NEGATIVE
Glucose, Urine, Semiquant: NEGATIVE
Protein, U semiquant: NEGATIVE
Specific Gravity, Urine: >=1.03
WBC Urine, dipstick: NEGATIVE
pH: 5

## 2010-06-24 ENCOUNTER — Encounter: Payer: Self-pay | Admitting: Internal Medicine

## 2010-06-25 ENCOUNTER — Encounter: Payer: Self-pay | Admitting: Internal Medicine

## 2010-07-01 ENCOUNTER — Ambulatory Visit (HOSPITAL_COMMUNITY): Admission: RE | Admit: 2010-07-01 | Discharge: 2010-07-01 | Payer: Self-pay | Admitting: Internal Medicine

## 2010-07-02 ENCOUNTER — Ambulatory Visit: Payer: Self-pay | Admitting: Internal Medicine

## 2010-07-02 LAB — CONVERTED CEMR LAB: Blood Glucose, Fingerstick: 184

## 2010-07-03 ENCOUNTER — Ambulatory Visit: Payer: Self-pay | Admitting: Internal Medicine

## 2010-07-03 ENCOUNTER — Encounter: Payer: Self-pay | Admitting: Internal Medicine

## 2010-07-03 ENCOUNTER — Emergency Department (HOSPITAL_COMMUNITY): Admission: EM | Admit: 2010-07-03 | Discharge: 2010-07-03 | Payer: Self-pay | Admitting: Emergency Medicine

## 2010-07-08 ENCOUNTER — Ambulatory Visit: Payer: Self-pay | Admitting: Internal Medicine

## 2010-07-08 LAB — CONVERTED CEMR LAB
ALT: 12 units/L (ref 0–35)
AST: 16 units/L (ref 0–37)
Albumin: 4.1 g/dL (ref 3.5–5.2)
Basophils Absolute: 0 10*3/uL (ref 0.0–0.1)
CO2: 26 meq/L (ref 19–32)
Calcium: 9.3 mg/dL (ref 8.4–10.5)
Chloride: 105 meq/L (ref 96–112)
Creatinine, Ser: 0.81 mg/dL (ref 0.40–1.20)
Eosinophils Relative: 2 % (ref 0–5)
Ketones, ur: NEGATIVE mg/dL
Leukocytes, UA: NEGATIVE
Lymphocytes Relative: 49 % — ABNORMAL HIGH (ref 12–46)
Lymphs Abs: 4.3 10*3/uL — ABNORMAL HIGH (ref 0.7–4.0)
Neutro Abs: 3.6 10*3/uL (ref 1.7–7.7)
Neutrophils Relative %: 41 % — ABNORMAL LOW (ref 43–77)
Nitrite: NEGATIVE
Platelets: 281 10*3/uL (ref 150–400)
Potassium: 4 meq/L (ref 3.5–5.3)
RDW: 18.2 % — ABNORMAL HIGH (ref 11.5–15.5)
Specific Gravity, Urine: 1.026 (ref 1.005–1.0)
TSH: 7.747 microintl units/mL — ABNORMAL HIGH (ref 0.350–4.5)
Total Protein: 7.5 g/dL (ref 6.0–8.3)
Urobilinogen, UA: 0.2 (ref 0.0–1.0)
WBC: 8.7 10*3/uL (ref 4.0–10.5)
pH: 5 (ref 5.0–8.0)

## 2010-07-27 ENCOUNTER — Telehealth: Payer: Self-pay | Admitting: Internal Medicine

## 2010-07-29 ENCOUNTER — Telehealth: Payer: Self-pay | Admitting: *Deleted

## 2010-07-29 ENCOUNTER — Ambulatory Visit: Payer: Self-pay | Admitting: Internal Medicine

## 2010-07-29 LAB — CONVERTED CEMR LAB
Glucose, Urine, Semiquant: 250
Nitrite: NEGATIVE
Protein, U semiquant: NEGATIVE
Specific Gravity, Urine: >=1.03

## 2010-08-05 ENCOUNTER — Ambulatory Visit: Payer: Self-pay | Admitting: Internal Medicine

## 2010-08-05 LAB — CONVERTED CEMR LAB
BUN: 14 mg/dL (ref 6–23)
CO2: 26 meq/L (ref 19–32)
Calcium: 9 mg/dL (ref 8.4–10.5)
Glucose, Bld: 124 mg/dL — ABNORMAL HIGH (ref 70–99)
Potassium: 3.9 meq/L (ref 3.5–5.3)
Sodium: 142 meq/L (ref 135–145)

## 2010-08-06 ENCOUNTER — Encounter
Admission: RE | Admit: 2010-08-06 | Discharge: 2010-09-22 | Payer: Self-pay | Source: Home / Self Care | Attending: Internal Medicine | Admitting: Internal Medicine

## 2010-08-07 ENCOUNTER — Ambulatory Visit (HOSPITAL_COMMUNITY): Admission: RE | Admit: 2010-08-07 | Discharge: 2010-08-07 | Payer: Self-pay | Admitting: Internal Medicine

## 2010-08-10 ENCOUNTER — Encounter: Payer: Self-pay | Admitting: Internal Medicine

## 2010-08-13 ENCOUNTER — Encounter: Payer: Self-pay | Admitting: Internal Medicine

## 2010-08-18 ENCOUNTER — Encounter: Admission: RE | Admit: 2010-08-18 | Discharge: 2010-08-18 | Payer: Self-pay | Admitting: Internal Medicine

## 2010-08-18 LAB — HM MAMMOGRAPHY

## 2010-08-20 ENCOUNTER — Telehealth: Payer: Self-pay | Admitting: *Deleted

## 2010-08-21 ENCOUNTER — Telehealth (INDEPENDENT_AMBULATORY_CARE_PROVIDER_SITE_OTHER): Payer: Self-pay | Admitting: *Deleted

## 2010-08-26 ENCOUNTER — Ambulatory Visit: Payer: Self-pay | Admitting: Internal Medicine

## 2010-08-26 ENCOUNTER — Telehealth (INDEPENDENT_AMBULATORY_CARE_PROVIDER_SITE_OTHER): Payer: Self-pay | Admitting: *Deleted

## 2010-08-26 LAB — HM DIABETES FOOT EXAM

## 2010-09-11 ENCOUNTER — Encounter (INDEPENDENT_AMBULATORY_CARE_PROVIDER_SITE_OTHER): Payer: Self-pay | Admitting: *Deleted

## 2010-10-04 DIAGNOSIS — I872 Venous insufficiency (chronic) (peripheral): Secondary | ICD-10-CM

## 2010-10-04 HISTORY — DX: Venous insufficiency (chronic) (peripheral): I87.2

## 2010-10-24 ENCOUNTER — Encounter: Payer: Self-pay | Admitting: Internal Medicine

## 2010-10-25 ENCOUNTER — Encounter: Payer: Self-pay | Admitting: Internal Medicine

## 2010-10-26 ENCOUNTER — Encounter: Payer: Self-pay | Admitting: Internal Medicine

## 2010-10-27 ENCOUNTER — Telehealth: Payer: Self-pay | Admitting: Internal Medicine

## 2010-10-28 ENCOUNTER — Encounter: Payer: Self-pay | Admitting: Internal Medicine

## 2010-10-28 ENCOUNTER — Emergency Department (HOSPITAL_COMMUNITY)
Admission: EM | Admit: 2010-10-28 | Discharge: 2010-10-29 | Disposition: A | Discharge status: Other Acute Inpatient Hospital | Payer: Self-pay | Source: Home / Self Care | Admitting: Emergency Medicine

## 2010-10-28 ENCOUNTER — Telehealth: Payer: Self-pay | Admitting: Internal Medicine

## 2010-10-28 LAB — CBC
HCT: 32.2 % — ABNORMAL LOW (ref 36.0–46.0)
MCV: 82.6 fL (ref 78.0–100.0)
RBC: 3.9 MIL/uL (ref 3.87–5.11)
RDW: 18.3 % — ABNORMAL HIGH (ref 11.5–15.5)
WBC: 8 10*3/uL (ref 4.0–10.5)

## 2010-10-28 LAB — DIFFERENTIAL
Basophils Absolute: 0 10*3/uL (ref 0.0–0.1)
Eosinophils Relative: 2 % (ref 0–5)
Lymphocytes Relative: 50 % — ABNORMAL HIGH (ref 12–46)
Lymphs Abs: 4 10*3/uL (ref 0.7–4.0)
Neutro Abs: 3.1 10*3/uL (ref 1.7–7.7)
Neutrophils Relative %: 39 % — ABNORMAL LOW (ref 43–77)

## 2010-10-28 LAB — POCT CARDIAC MARKERS
CKMB, poc: 1.3 ng/mL (ref 1.0–8.0)
Troponin i, poc: 0.1 ng/mL — ABNORMAL HIGH (ref 0.00–0.09)

## 2010-10-28 LAB — POCT I-STAT, CHEM 8
BUN: 16 mg/dL (ref 6–23)
Calcium, Ion: 1.15 mmol/L (ref 1.12–1.32)
Chloride: 107 mEq/L (ref 96–112)
Glucose, Bld: 87 mg/dL (ref 70–99)
HCT: 34 % — ABNORMAL LOW (ref 36.0–46.0)
Potassium: 3.4 mEq/L — ABNORMAL LOW (ref 3.5–5.1)

## 2010-10-28 LAB — CK TOTAL AND CKMB (NOT AT ARMC): Total CK: 187 U/L — ABNORMAL HIGH (ref 7–177)

## 2010-10-28 LAB — BRAIN NATRIURETIC PEPTIDE: Pro B Natriuretic peptide (BNP): 174 pg/mL — ABNORMAL HIGH (ref 0.0–100.0)

## 2010-10-29 ENCOUNTER — Observation Stay (HOSPITAL_COMMUNITY)
Admission: EM | Admit: 2010-10-29 | Discharge: 2010-10-31 | Payer: Self-pay | Attending: Internal Medicine | Admitting: Internal Medicine

## 2010-10-29 DIAGNOSIS — I10 Essential (primary) hypertension: Secondary | ICD-10-CM

## 2010-10-29 LAB — HEMOGLOBIN A1C
Hgb A1c MFr Bld: 7.2 % — ABNORMAL HIGH (ref <5.7)
Mean Plasma Glucose: 160 mg/dL — ABNORMAL HIGH (ref <117)

## 2010-10-29 LAB — URINALYSIS, MICROSCOPIC ONLY
Hgb urine dipstick: NEGATIVE
Leukocytes, UA: NEGATIVE
Nitrite: NEGATIVE
Protein, ur: NEGATIVE mg/dL
Urobilinogen, UA: 0.2 mg/dL (ref 0.0–1.0)

## 2010-10-29 LAB — LIPID PANEL
Cholesterol: 207 mg/dL — ABNORMAL HIGH (ref 0–200)
HDL: 58 mg/dL (ref >39)
Total CHOL/HDL Ratio: 3.6 RATIO
Triglycerides: 70 mg/dL (ref <150)

## 2010-10-29 LAB — HEPATIC FUNCTION PANEL
ALT: 15 U/L (ref 0–35)
Alkaline Phosphatase: 60 U/L (ref 39–117)
Bilirubin, Direct: <0.1 mg/dL (ref 0.0–0.3)

## 2010-10-29 LAB — GLUCOSE, CAPILLARY
Glucose-Capillary: 190 mg/dL — ABNORMAL HIGH (ref 70–99)
Glucose-Capillary: 166 mg/dL — ABNORMAL HIGH (ref 70–99)

## 2010-10-29 LAB — MRSA PCR SCREENING: MRSA by PCR: NEGATIVE

## 2010-10-29 LAB — TSH: TSH: 2.48 u[IU]/mL (ref 0.350–4.500)

## 2010-10-29 LAB — BASIC METABOLIC PANEL
BUN: 12 mg/dL (ref 6–23)
Calcium: 8.8 mg/dL (ref 8.4–10.5)
Creatinine, Ser: 0.77 mg/dL (ref 0.4–1.2)
GFR calc Af Amer: >60 mL/min (ref >60)
GFR calc non Af Amer: >60 mL/min (ref >60)

## 2010-10-29 LAB — CARDIAC PANEL(CRET KIN+CKTOT+MB+TROPI)
Relative Index: 1.2 (ref 0.0–2.5)
Troponin I: 0.12 ng/mL — ABNORMAL HIGH (ref 0.00–0.06)

## 2010-10-29 LAB — FOLATE: Folate: 15 ng/mL

## 2010-10-29 LAB — FERRITIN: Ferritin: 12 ng/mL (ref 10–291)

## 2010-10-30 ENCOUNTER — Encounter: Payer: Self-pay | Admitting: Internal Medicine

## 2010-10-30 LAB — BASIC METABOLIC PANEL
Calcium: 9.5 mg/dL (ref 8.4–10.5)
GFR calc Af Amer: >60 mL/min (ref >60)
GFR calc non Af Amer: >60 mL/min (ref >60)
Glucose, Bld: 126 mg/dL — ABNORMAL HIGH (ref 70–99)
Sodium: 140 mEq/L (ref 135–145)

## 2010-10-30 LAB — CBC
HCT: 32 % — ABNORMAL LOW (ref 36.0–46.0)
MCHC: 30.9 g/dL (ref 30.0–36.0)
Platelets: 224 10*3/uL (ref 150–400)
RDW: 18.6 % — ABNORMAL HIGH (ref 11.5–15.5)

## 2010-10-30 LAB — GLUCOSE, CAPILLARY

## 2010-11-01 LAB — CONVERTED CEMR LAB
BUN: 15 mg/dL (ref 6–23)
Calcium: 9.2 mg/dL (ref 8.4–10.5)
Creatinine, Ser: 0.86 mg/dL (ref 0.40–1.20)
Glucose, Bld: 131 mg/dL — ABNORMAL HIGH (ref 70–99)

## 2010-11-05 NOTE — Assessment & Plan Note (Signed)
Summary: EST-3 WEEK RECHECK FOR BP PER MAGICK/CH   Vital Signs:  Patient profile:   60 year old female Height:      61 inches Weight:      215.2 pounds BMI:     40.81 Temp:     98.0 degrees F oral Pulse rate:   79 / minute BP sitting:   187 / 98  (right arm)  Vitals Entered By: Filomena Jungling NT II (Feb 17, 2010 3:59 PM) CC: blood pressure check Is Patient Diabetic? No Pain Assessment Patient in pain? no      Nutritional Status BMI of > 30 = obese  Does patient need assistance? Functional Status Self care Ambulation Normal   Primary Care Provider:  Blondell Reveal MD  CC:  blood pressure check.  History of Present Illness: 60 yo female with PMH outlined below presents to [facility] for regular follow up appointment on her BP. Patients blood pressure last month was in 160's and she was in pain hence no changes were made to her medications. She is here to follow up on her BP.  She reports that she was seen in [place] and was admitted to the hospital for high blood pressures (230's/110's). She reports that she had an MRI brain but I do not see the report in the echart. She denies any CP/SOB/N/V/D/AP. She does complain of occasional headaches.  She denies any other problems.  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 Take 1 Tablet By Mouth Two Times A Day 2)  Lantus 100 Unit/ml Soln (Insulin Glargine) .... Inject 70 Units Once Daily. 3)  Bd Insulin Syringe Microfine 28g X 1/2" 0.5 Ml  Misc (Insulin Syringe-Needle U-100) .... As Directed 4)  Freestyle Test  Strp (Glucose Blood) .... Use To Check Blood Sugar Twice Daily 5)  Lancets 30g  Misc (Lancets) .... Use To Check Blood Sugar Twice A Day 6)  Toprol Xl 50 Mg Xr24h-Tab (Metoprolol Succinate) .... Take 1 Tablet By Mouth Two Times A Day 7)  Hydrochlorothiazide 25 Mg Tabs (Hydrochlorothiazide) .... Take 1 Tablet By Mouth Once A Day 8)  Kay Ciel 20 Meq Pack (Potassium Chloride) .... Take 1 Tablet By Mouth Once A Day 9)  Lipitor 40 Mg Tabs (Atorvastatin Calcium) .... Take 1 Tablet By Mouth Once A Day 10)  Aspirin 81 Mg Tbec (Aspirin) .... Take 1 Tablet By Mouth Once A Day 11)  Avapro  300 Mg  Tabs (Irbesartan) .... Take 1 Tablet By Mouth Once A Day 12)  Neurontin 300 Mg Caps (Gabapentin) .... Take 1 Tablet Day 1, Then 1 Tablet Two Times A Day At Day 2, Then Start To Take 1 Tablet By Mouth Three Times A Day After Day 3. 13)  Clonidine Hcl 0.1 Mg Tabs (Clonidine Hcl) .... Take 1 Tablet By Mouth Two Times A Day 14)  Centrum  Tabs (Multiple Vitamins-Minerals) .... Take 1 Tablet By Mouth Once A Day 15)  Ibuprofen 800 Mg Tabs (Ibuprofen) .... Take 1 Tablet Every 6 Hours As Needed For Pain  Current Medications (verified): 1)  Metformin Hcl 1000 Mg Tabs (Metformin Hcl) .... Take 1 Tablet By Mouth Two Times A Day 2)  Lantus 100 Unit/ml Soln (Insulin Glargine) .... Inject 70 Units Once Daily. 3)  Bd Insulin  Syringe Microfine 28g X 1/2" 0.5 Ml  Misc (Insulin Syringe-Needle U-100) .... As Directed 4)  Freestyle Test  Strp (Glucose Blood) .... Use To Check Blood Sugar Twice Daily 5)  Lancets 30g  Misc (Lancets) .... Use To Check Blood Sugar Twice A Day 6)  Toprol Xl 50 Mg Xr24h-Tab (Metoprolol Succinate) .... Take 1 Tablet By Mouth Two Times A Day 7)  Hydrochlorothiazide 25 Mg Tabs (Hydrochlorothiazide) .... Take 1 Tablet By Mouth Once A Day 8)  Kay Ciel 20 Meq Pack (Potassium Chloride) .... Take 1 Tablet By Mouth Once A Day 9)  Lipitor 40 Mg Tabs (Atorvastatin Calcium) .... Take 1 Tablet By Mouth Once A Day 10)  Aspirin 81 Mg Tbec (Aspirin) .... Take 1 Tablet By Mouth Once A Day 11)  Avapro 300 Mg  Tabs (Irbesartan) .... Take 1 Tablet By Mouth Once A Day 12)  Neurontin 300 Mg Caps (Gabapentin) .... Take 1 Tablet Day 1, Then 1 Tablet Two Times A Day At Day 2, Then Start To Take 1 Tablet By Mouth Three Times A Day After Day 3. 13)  Clonidine Hcl 0.1 Mg Tabs (Clonidine Hcl) .... Take 1 Tablet By Mouth Two Times A Day 14)  Centrum  Tabs (Multiple Vitamins-Minerals) .... Take 1 Tablet By Mouth Once A Day 15)  Ibuprofen 800 Mg Tabs (Ibuprofen) .... Take 1 Tablet Every 6 Hours As Needed For Pain  Allergies (verified): 1)  ! Benadryl 2)  ! 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 EF 55% by ECHO 11/07 Diabetes mellitus, type II Hypertension GERD Hyperlipidemia Obesity H/o colon polyp seen on Colonoscopy by Dr. Madilyn Fireman 1995(per pt) Repeat Colonoscopy by Dr. ZOXW(96/04)                                   Isolated diverticulum in mid transverse colon.  Small non bleeding internal hemorrhoids                                   Repeat Colonoscopy every 5 years. Moderate OSA on sleep study(02/16/07) HTN crisis admitted to [facility] 3/11 (see record)  Past Surgical History: Reviewed history from 04/18/2008 and no changes required. S/P surgery and chemotherapy for lt breast cancer. colon polypectomy -2004. hysterectomy 1985.  Family History: Reviewed history from 04/18/2008 and no changes required. Family History Diabetes 1st degree relative: father passed of its complications at 43 Family History Lung cancer: mother died at 34 Brother died of a PE Sister died of breast CA  Social History: Reviewed history from 01/07/2009 and no changes required. 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  alert and overweight-appearing.   Head:  normocephalic and atraumatic.   Lungs:  normal respiratory effort, no intercostal retractions, no accessory muscle use, and normal breath sounds.   Heart:  normal rate, regular rhythm, and no murmur.   Abdomen:  soft and non-tender.   Msk:  normal ROM and no joint tenderness.   Extremities:  trace left pedal edema and trace right pedal edema.   Neurologic:  alert & oriented X3, strength normal in all extremities, and gait normal.     Impression & Recommendations:  Problem # 1:  HYPERTENSION  (ICD-401.9) Uncontrolled. Not sure why she was took off on norvasc. Given that BP was well controlled while she was on CCB combination. Repeat manual BP is 168/64. Will start her on norvasc 10 mg. and increase her toprol xl to 100mg . Will follow up in a month.  The following medications were removed from the medication list:    Hydrochlorothiazide 25 Mg Tabs (Hydrochlorothiazide) .Marland Kitchen... Take 1 tablet by mouth once a day    Avapro 300 Mg Tabs (Irbesartan) .Marland Kitchen... Take 1 tablet by mouth once a day Her updated medication list for this problem includes:    Toprol Xl 50 Mg Xr24h-tab (Metoprolol succinate) .Marland Kitchen... Take 2 tablets by mouth two times a day    Clonidine Hcl 0.1 Mg Tabs (Clonidine hcl) .Marland Kitchen... Take 1 tablet by mouth two times a day    Amlodipine Besylate 10 Mg Tabs (Amlodipine besylate) .Marland Kitchen... Take 1 tablet by mouth once a day    Lisinopril-hydrochlorothiazide 20-25 Mg Tabs (Lisinopril-hydrochlorothiazide) .Marland Kitchen... Take 1 tablet by mouth once a day  Problem # 2:  DIABETES MELLITUS, TYPE II (ICD-250.00) Fasting CBG still elevated. Will increase lantus to 72 units.  The following medications were removed from the medication list:    Avapro 300 Mg Tabs (Irbesartan) .Marland Kitchen... Take 1 tablet by mouth once a day Her updated medication list for this problem includes:    Metformin Hcl 1000 Mg Tabs (Metformin hcl) .Marland Kitchen... Take 1 tablet by mouth two times a day    Lantus 100 Unit/ml Soln (Insulin glargine) ..... Inject 72 units once daily.    Aspirin 81 Mg Tbec (Aspirin) .Marland Kitchen... Take 1 tablet by mouth once a day    Lisinopril-hydrochlorothiazide 20-25 Mg Tabs (Lisinopril-hydrochlorothiazide) .Marland Kitchen... Take 1 tablet by mouth once a day  Complete Medication List: 1)  Metformin Hcl 1000 Mg Tabs (Metformin hcl) .... Take 1 tablet by mouth two times a day 2)  Lantus 100 Unit/ml  Soln (Insulin glargine) .... Inject 72 units once daily. 3)  Bd Insulin Syringe Microfine 28g X 1/2" 0.5 Ml Misc (Insulin syringe-needle u-100) ....  As directed 4)  Freestyle Test Strp (Glucose blood) .... Use to check blood sugar twice daily 5)  Lancets 30g Misc (Lancets) .... Use to check blood sugar twice a day 6)  Toprol Xl 50 Mg Xr24h-tab (Metoprolol succinate) .... Take 2 tablets by mouth two times a day 7)  Kay Ciel 20 Meq Pack (Potassium chloride) .... Take 1 tablet by mouth once a day 8)  Lipitor 40 Mg Tabs (Atorvastatin calcium) .... Take 1 tablet by mouth once a day 9)  Aspirin 81 Mg Tbec (Aspirin) .... Take 1 tablet by mouth once a day 10)  Neurontin 300 Mg Caps (Gabapentin) .... Take 1 tablet day 1, then 1 tablet two times a day at day 2, then start to take 1 tablet by mouth three times a day after day 3. 11)  Clonidine Hcl 0.1 Mg Tabs (Clonidine hcl) .... Take 1 tablet by mouth two times a day 12)  Centrum Tabs (Multiple vitamins-minerals) .... Take 1 tablet by mouth once a day 13)  Ibuprofen 800 Mg Tabs (Ibuprofen) .... Take 1 tablet every 6 hours as needed for pain 14)  Amlodipine Besylate 10 Mg Tabs (Amlodipine besylate) .... Take 1 tablet by mouth once a day 15)  Lisinopril-hydrochlorothiazide 20-25 Mg Tabs (Lisinopril-hydrochlorothiazide) .... Take 1 tablet by mouth once a day  Patient Instructions: 1)  Please schedule a follow-up appointment in 1 month. 2)  Take all the medications as advised below. Prescriptions: LISINOPRIL-HYDROCHLOROTHIAZIDE 20-25 MG TABS (LISINOPRIL-HYDROCHLOROTHIAZIDE) Take 1 tablet by mouth once a day  #30 x 11   Entered and Authorized by:   Blondell Reveal MD   Signed by:   Blondell Reveal MD on 02/17/2010   Method used:   Electronically to        [facility] [phone]* (retail)       [address]       [place], [place]  [address]       Ph: [phone]       Fax: [phone]   RxID:   [id number] AMLODIPINE BESYLATE 10 MG TABS (AMLODIPINE BESYLATE) Take 1 tablet by mouth once a day  #30 x 11   Entered and Authorized by:   Blondell Reveal MD   Signed by:   Blondell Reveal MD on 02/17/2010   Method used:   Electronically to        [facility] [phone]* (retail)       [address]       [place], [place]  [address]       Ph: [phone]       Fax: [phone]   RxID:   [id number]

## 2010-11-05 NOTE — Progress Notes (Addendum)
Summary: refill page 3/ hla  Phone Note Refill Request Message from:  Patient on October 27, 2010 8:51 AM  Refills Requested: Medication #1:  CENTRUM  TABS Take 1 tablet by mouth once a day   Dosage confirmed as above?Dosage Confirmed  Medication #2:  IBUPROFEN 800 MG TABS take 1 tablet every 6 hours as needed for pain   Dosage confirmed as above?Dosage Confirmed  Medication #3:  AMLODIPINE BESYLATE 10 MG TABS Take 1 tablet by mouth once a day   Dosage confirmed as above?Dosage Confirmed  Medication #4:  HYDROCHLOROTHIAZIDE 25 MG TABS Take 1 tablet by mouth once a day   Dosage confirmed as above?Dosage Confirmed page 3 changing all to [facility]  Initial call taken by: Marin Roberts RN,  October 27, 2010 8:52 AM  Follow-up for Phone Call       Follow-up by: Blondell Reveal MD,  October 28, 2010 12:32 PM    Prescriptions: CENTRUM  TABS (MULTIPLE VITAMINS-MINERALS) Take 1 tablet by mouth once a day  #30 x 3   Entered and Authorized by:   Blondell Reveal MD   Signed by:   Blondell Reveal MD on 10/28/2010   Method used:   Faxed to ...       [facility] Department (retail)       [address] [place], [place]  [address]       Ph: [phone]       Fax: [phone]   RxID:   [id number] IBUPROFEN 800 MG TABS (IBUPROFEN) take 1 tablet every 6 hours as needed for pain  #60 x 3   Entered and Authorized by:   Blondell Reveal MD   Signed by:   Blondell Reveal MD on 10/28/2010   Method used:   Faxed to ...       [facility] Department (retail)       [address] [place], [place]  [address]       Ph: [phone]       Fax: [phone]   RxID:   [id number] AMLODIPINE BESYLATE 10 MG TABS (AMLODIPINE BESYLATE) Take 1 tablet by mouth once a day  #30 x 3   Entered and Authorized by:   Blondell Reveal MD   Signed by:   Blondell Reveal MD on 10/28/2010   Method used:   Faxed to ...       [facility] Department (retail)       [address]  [place], [place]  [address]       Ph: [phone]       Fax: [phone]   RxID:   [id number] HYDROCHLOROTHIAZIDE 25 MG TABS (HYDROCHLOROTHIAZIDE) Take 1 tablet by mouth once a day  #31 x 3   Entered and Authorized by:   Blondell Reveal MD   Signed by:   Blondell Reveal MD on 10/28/2010   Method used:   Faxed to ...       [facility] Department (retail)       [address] [place], [place]  [address]       Ph: [phone]       Fax: [phone]   RxID:   [id number]

## 2010-11-05 NOTE — Progress Notes (Addendum)
Summary: changing pharm page 4/ hla  Phone Note Refill Request Message from:  Patient on October 27, 2010 8:53 AM  Refills Requested: Medication #1:  CERON-DM 12.02-04-14 MG/5ML SYRP 1-2 teaspoons every 6-8 hours for cough.   Dosage confirmed as above?Dosage Confirmed  Medication #2:  LORATADINE 10 MG TABS Take 1 tablet by mouth two times a day as needed for allergies   Dosage confirmed as above?Dosage Confirmed  Medication #3:  CYCLOBENZAPRINE HCL 5 MG TABS three times a day.   Dosage confirmed as above?Dosage Confirmed Initial call taken by: Marin Roberts RN,  October 27, 2010 8:53 AM  Follow-up for Phone Call       Follow-up by: Blondell Reveal MD,  October 28, 2010 12:35 PM    Prescriptions: CYCLOBENZAPRINE HCL 5 MG TABS (CYCLOBENZAPRINE HCL) three times a day  #90 x 0   Entered and Authorized by:   Blondell Reveal MD   Signed by:   Blondell Reveal MD on 10/28/2010   Method used:   Faxed to ...       [facility] Department (retail)       [address] [place], [place]  [address]       Ph: [phone]       Fax: [phone]   RxID:   [id number] LORATADINE 10 MG TABS (LORATADINE) Take 1 tablet by mouth two times a day as needed for allergies  #60 x 3   Entered and Authorized by:   Blondell Reveal MD   Signed by:   Blondell Reveal MD on 10/28/2010   Method used:   Faxed to ...       [facility] Department (retail)       [address] [place], [place]  [address]       Ph: [phone]       Fax: [phone]   RxID:   [id number] CERON-DM 12.02-04-14 MG/5ML SYRP (PHENYLEPHRINE-CHLORPHEN-DM) 1-2 teaspoons every 6-8 hours for cough.  #90 day supp x 3   Entered and Authorized by:   Blondell Reveal MD   Signed by:   Blondell Reveal MD on 10/28/2010   Method used:   Faxed to ...       [facility] Department (retail)       [address] [place], [place]  [address]       Ph: [phone]       Fax: [phone]   RxID:    [id number]

## 2010-11-05 NOTE — Progress Notes (Addendum)
Summary: refills page 2/ hla  Phone Note Refill Request Message from:  Patient on October 27, 2010 8:50 AM  Refills Requested: Medication #1:  LIPITOR 40 MG TABS Take 1 tablet by mouth once a day   Dosage confirmed as above?Dosage Confirmed  Medication #2:  ASPIRIN 81 MG TBEC Take 1 tablet by mouth once a day   Dosage confirmed as above?Dosage Confirmed  Medication #3:  NEURONTIN 300 MG CAPS Take 1 tablet by mouth three times a day after day 3.   Dosage confirmed as above?Dosage Confirmed  Medication #4:  CLONIDINE HCL 0.2 MG TABS Take 1 tablet by mouth two times a day   Dosage confirmed as above?Dosage Confirmed page 2 changing all to [facility]  Initial call taken by: Marin Roberts RN,  October 27, 2010 8:51 AM  Follow-up for Phone Call       Follow-up by: Blondell Reveal MD,  October 28, 2010 12:31 PM    Prescriptions: CLONIDINE HCL 0.2 MG TABS (CLONIDINE HCL) Take 1 tablet by mouth two times a day  #62 x 3   Entered and Authorized by:   Blondell Reveal MD   Signed by:   Blondell Reveal MD on 10/28/2010   Method used:   Faxed to ...       [facility] Department (retail)       [address] [place], [place]  [address]       Ph: [phone]       Fax: [phone]   RxID:   [id number] NEURONTIN 300 MG CAPS (GABAPENTIN) Take 1 tablet by mouth three times a day after day 3.  #90 x 3   Entered and Authorized by:   Blondell Reveal MD   Signed by:   Blondell Reveal MD on 10/28/2010   Method used:   Faxed to ...       [facility] Department (retail)       [address] [place], [place]  [address]       Ph: [phone]       Fax: [phone]   RxID:   [id number] ASPIRIN 81 MG TBEC (ASPIRIN) Take 1 tablet by mouth once a day  #93 x 3   Entered and Authorized by:   Blondell Reveal MD   Signed by:   Blondell Reveal MD on 10/28/2010   Method used:   Faxed to ...       [facility] Department (retail)       [address] [place], [place]  [address]       Ph: [phone]       Fax: [phone]   RxID:   [id number] LIPITOR 40 MG TABS (ATORVASTATIN CALCIUM) Take 1 tablet by mouth once a day  #93 x 3   Entered and Authorized by:   Blondell Reveal MD   Signed by:   Blondell Reveal MD on 10/28/2010   Method used:   Faxed to ...       [facility] Department (retail)       [address] [place], [place]  [address]       Ph: [phone]       Fax: [phone]   RxID:   [id number]

## 2010-11-05 NOTE — Assessment & Plan Note (Signed)
Summary: sob/hypertension/gg   Vital Signs:  Patient profile:   60 year old female Height:      61 inches (154.94 cm) Weight:      214.8 pounds (97.64 kg) BMI:     40.73 O2 Sat:      100 % on Room air Temp:     97.4 degrees F (36.33 degrees C) oral Pulse rate:   76 / minute BP sitting:   120 / 66  (right arm)  Vitals Entered By: Stanton Kidney Ditzler RN (August 26, 2010 1:31 PM)  O2 Flow:  Room air Is Patient Diabetic? Yes Did you bring your meter with you today? Yes Pain Assessment Patient in pain? yes     Location: back Intensity: 5 Type: aches Onset of pain  long time Nutritional Status BMI of > 30 = obese Nutritional Status Detail appetite good CBG Result 149  Have you ever been in a relationship where you felt threatened, hurt or afraid?denies   Does patient need assistance? Functional Status Self care Ambulation Normal Comments Past year - off and on problems feeling tired, SOB and fuild in lungs. BP reck right arm 2:35PM 132/72 pulse 74.   Diabetic Foot Exam Pulse Check          Right Foot          Left Foot Posterior Tibial:        normal            normal Dorsalis Pedis:        normal            normal  High Risk Feet? Yes   10-g (5.07) Semmes-Weinstein Monofilament Test Performed by: Stanton Kidney Ditzler RN          Right Foot          Left Foot Visual Inspection     normal         normal   Primary Care Provider:  Blondell Reveal MD   History of Present Illness: 60 y/o woman comes to the clinic for dizziness for past few months.  She reports that her BP is fluctuating a lot now a days, sometimes it is as high as systolic's in 160-170's and sometimes it's low in 120's. She is concerned that these fluctuations in her BP are causing her to feel dizzy. She describes her dizziness as sensation of room spinning around her, can occur anytime  but is not sure for the exact duration as to how long do they last.  She has not noticed if they get worse with changes in  position of her head. Denies hearing loss, tinnitus, N/V, dysphagia, speech disturbances.  Depression History:      The patient denies a depressed mood most of the day and a diminished interest in her usual daily activities.         Preventive Screening-Counseling & Management  Alcohol-Tobacco     Alcohol drinks/day: <1     Smoking Status: never     Passive Smoke Exposure: yes  Caffeine-Diet-Exercise     Does Patient Exercise: yes     Type of exercise: walk     Times/week: 5  Current Medications (verified): 1)  Metformin Hcl 1000 Mg Tabs (Metformin Hcl) .... Take 1 Tablet By Mouth Two Times A Day 2)  Lantus 100 Unit/ml Soln (Insulin Glargine) .... Inject 72 Units Once Daily. 3)  Bd Insulin Syringe Microfine 28g X 1/2" 0.5 Ml  Misc (Insulin Syringe-Needle U-100) .... As Directed 4)  Freestyle Test  Strp (Glucose Blood) .... Use To Check Blood Sugar Twice Daily 5)  Lancets 30g  Misc (Lancets) .... Use To Check Blood Sugar Twice A Day 6)  Metoprolol Succinate 100 Mg Xr24h-Tab (Metoprolol Succinate) .... Take 1 Tablet By Mouth Once A Day 7)  Kay Ciel 20 Meq Pack (Potassium Chloride) .... Take 1 Tablet By Mouth Once A Day 8)  Lipitor 40 Mg Tabs (Atorvastatin Calcium) .... Take 1 Tablet By Mouth Once A Day 9)  Aspirin 81 Mg Tbec (Aspirin) .... Take 1 Tablet By Mouth Once A Day 10)  Neurontin 300 Mg Caps (Gabapentin) .... Take 1 Tablet By Mouth Three Times A Day After Day 3. 11)  Clonidine Hcl 0.2 Mg Tabs (Clonidine Hcl) .... Take 1 Tablet By Mouth Two Times A Day 12)  Centrum  Tabs (Multiple Vitamins-Minerals) .... Take 1 Tablet By Mouth Once A Day 13)  Ibuprofen 800 Mg Tabs (Ibuprofen) .... Take 1 Tablet Every 6 Hours As Needed For Pain 14)  Amlodipine Besylate 10 Mg Tabs (Amlodipine Besylate) .... Take 1 Tablet By Mouth Once A Day 15)  Hydrochlorothiazide 25 Mg Tabs (Hydrochlorothiazide) .... Take 1 Tablet By Mouth Once A Day 16)  Ceron-Dm 12.02-04-14 Mg/49ml Syrp  (Phenylephrine-Chlorphen-Dm) .Marland Kitchen.. 1-2 Teaspoons Every 6-8 Hours For Cough. 17)  Loratadine 10 Mg Tabs (Loratadine) .... Take 1 Tablet By Mouth Two Times A Day As Needed For Allergies 18)  Cyclobenzaprine Hcl 5 Mg Tabs (Cyclobenzaprine Hcl) .... Three Times A Day 19)  Ciprofloxacin Hcl 250 Mg Tabs (Ciprofloxacin Hcl) .... Take 1 Tablet By Mouth Two Times A Day For 3 Days  Allergies: 1)  ! Benadryl 2)  ! * Flu Vaccination  Review of Systems      See HPI  The patient denies anorexia, fever, chest pain, syncope, dyspnea on exertion, prolonged cough, and headaches.    Physical Exam  General:  alert, morbidly obese,well-developed, and well-nourished.   Head:  normocephalic and atraumatic.   Eyes:  vision grossly intact, pupils equal, pupils round, and pupils reactive to light.   Mouth:  pharynx pink and moist.   Neck:  supple and full ROM.   Lungs:  normal respiratory effort, no intercostal retractions, no accessory muscle use, normal breath sounds, no dullness, no fremitus, and no crackles.   Heart:  normal rate, regular rhythm, no murmur, no gallop, no rub, no JVD, and no HJR.   Abdomen:  soft, non-tender, normal bowel sounds, no distention, no masses, no guarding, no rigidity, and no rebound tenderness.   Msk:  normal ROM, no joint tenderness, no joint swelling, no joint warmth, and no redness over joints.   Pulses:  2+pulses b/l. Neurologic:  alert & oriented X3, cranial nerves II-XII intact, strength normal in all extremities, sensation intact to light touch, sensation intact to pinprick, gait normal, and DTRs symmetrical and normal.   Dix hallpike test is negative.  Diabetes Management Exam:    Foot Exam (with socks and/or shoes not present):       Sensory-Monofilament:          Left foot: normal          Right foot: normal   Impression & Recommendations:  Problem # 1:  DIZZINESS, CHRONIC (ICD-780.4) Assessment Comment Only She complains of dizziness which is ongoing forpast  few months. She describes it as a sensation of room spinning around her, can not tell the exact duration for how long does it last. She has not noticed if it  gets worse with changes in head position. Denies hearing loss/ tinnitus/ ear pain/ dysphagia/ dysarthria. On exam, dix hallspike is negative.The etiology for her dizziness is unclear. Differentials include BPPV vs Menier's disease vs vestibular neuritis. Will not treat with any medication at this time. She was adviced to call the clinic if her symptoms persist or get worse. Her updated medication list for this problem includes:    Loratadine 10 Mg Tabs (Loratadine) .Marland Kitchen... Take 1 tablet by mouth two times a day as needed for allergies  Problem # 2:  HYPERTENSION (ICD-401.9) Assessment: Improved Her BP today is 120/66 . Well controlled. Continue current regimen for now. Her updated medication list for this problem includes:    Metoprolol Succinate 100 Mg Xr24h-tab (Metoprolol succinate) .Marland Kitchen... Take 1 tablet by mouth once a day    Clonidine Hcl 0.2 Mg Tabs (Clonidine hcl) .Marland Kitchen... Take 1 tablet by mouth two times a day    Amlodipine Besylate 10 Mg Tabs (Amlodipine besylate) .Marland Kitchen... Take 1 tablet by mouth once a day    Hydrochlorothiazide 25 Mg Tabs (Hydrochlorothiazide) .Marland Kitchen... Take 1 tablet by mouth once a day  Problem # 3:  DIABETES MELLITUS, TYPE II (ICD-250.00) Well controlled. Her last AIC was 7.1 in September 2011. Continue current regimen. Her updated medication list for this problem includes:    Metformin Hcl 1000 Mg Tabs (Metformin hcl) .Marland Kitchen... Take 1 tablet by mouth two times a day    Lantus 100 Unit/ml Soln (Insulin glargine) ..... Inject 72 units once daily.    Aspirin 81 Mg Tbec (Aspirin) .Marland Kitchen... Take 1 tablet by mouth once a day  Orders: Capillary Blood Glucose/CBG (40981)  Complete Medication List: 1)  Metformin Hcl 1000 Mg Tabs (Metformin hcl) .... Take 1 tablet by mouth two times a day 2)  Lantus 100 Unit/ml Soln (Insulin glargine)  .... Inject 72 units once daily. 3)  Bd Insulin Syringe Microfine 28g X 1/2" 0.5 Ml Misc (Insulin syringe-needle u-100) .... As directed 4)  Freestyle Test Strp (Glucose blood) .... Use to check blood sugar twice daily 5)  Lancets 30g Misc (Lancets) .... Use to check blood sugar twice a day 6)  Metoprolol Succinate 100 Mg Xr24h-tab (Metoprolol succinate) .... Take 1 tablet by mouth once a day 7)  Kay Ciel 20 Meq Pack (Potassium chloride) .... Take 1 tablet by mouth once a day 8)  Lipitor 40 Mg Tabs (Atorvastatin calcium) .... Take 1 tablet by mouth once a day 9)  Aspirin 81 Mg Tbec (Aspirin) .... Take 1 tablet by mouth once a day 10)  Neurontin 300 Mg Caps (Gabapentin) .... Take 1 tablet by mouth three times a day after day 3. 11)  Clonidine Hcl 0.2 Mg Tabs (Clonidine hcl) .... Take 1 tablet by mouth two times a day 12)  Centrum Tabs (Multiple vitamins-minerals) .... Take 1 tablet by mouth once a day 13)  Ibuprofen 800 Mg Tabs (Ibuprofen) .... Take 1 tablet every 6 hours as needed for pain 14)  Amlodipine Besylate 10 Mg Tabs (Amlodipine besylate) .... Take 1 tablet by mouth once a day 15)  Hydrochlorothiazide 25 Mg Tabs (Hydrochlorothiazide) .... Take 1 tablet by mouth once a day 16)  Ceron-dm 12.02-04-14 Mg/12ml Syrp (Phenylephrine-chlorphen-dm) .Marland Kitchen.. 1-2 teaspoons every 6-8 hours for cough. 17)  Loratadine 10 Mg Tabs (Loratadine) .... Take 1 tablet by mouth two times a day as needed for allergies 18)  Cyclobenzaprine Hcl 5 Mg Tabs (Cyclobenzaprine hcl) .... Three times a day  Patient Instructions: 1)  Please  schedule a follow up appointment with your primary care physician in 2 months. 2)  Please call the clinic if your dizziness get worse. 3)  You need to lose weight. Consider a lower calorie diet and regular exercise.    Orders Added: 1)  Capillary Blood Glucose/CBG [82948]     Prevention & Chronic Care Immunizations   Influenza vaccine: Not documented   Influenza vaccine deferral:  Not indicated  (01/30/2010)    Tetanus booster: 04/10/2009: Td    Pneumococcal vaccine: Not documented   Pneumococcal vaccine deferral: Refused  (03/25/2009)  Colorectal Screening   Hemoccult: Not documented   Hemoccult action/deferral: Not indicated  (01/30/2010)    Colonoscopy: Not documented   Colonoscopy action/deferral: Not indicated  (01/30/2010)   Colonoscopy due: 02/11/2010  Other Screening   Pap smear: Not documented   Pap smear action/deferral: Not indicated S/P hysterectomy  (03/25/2009)    Mammogram: BI-RADS CATEGORY 3:  Probably benign finding(s) - short interval^MM DIGITAL DIAGNOSTIC BILAT  (08/18/2010)   Mammogram due: 01/2009   Smoking status: never  (08/26/2010)  Diabetes Mellitus   HgbA1C: 7.1  (07/02/2010)   HgbA1C action/deferral: Ordered  (04/29/2010)   Hemoglobin A1C due: 10/13/2009    Eye exam: No diabetic retinopathy. OU   Visual acuity OD : 20/50     Visual acuity OS :  20/25    Intraocular pressure OD:  18    Intraocular pressure OS:   18     (02/26/2009)   Eye exam due: 02/26/2010    Foot exam: yes  (08/26/2010)   Foot exam action/deferral: Do today   High risk foot: Yes  (08/26/2010)   Foot care education: Done  (04/29/2010)   Foot exam due: 03/25/2010    Urine microalbumin/creatinine ratio: 21.6  (04/10/2009)   Urine microalbumin action/deferral: Refused   Urine microalbumin/cr due: 11/01/2009  Lipids   Total Cholesterol: 133  (07/22/2009)   LDL: 64  (07/22/2009)   LDL Direct: Not documented   HDL: 53  (07/22/2009)   Triglycerides: 78  (07/22/2009)   Lipid panel due: 03/25/2010    SGOT (AST): 16  (06/24/2010)   SGPT (ALT): 12  (06/24/2010)   Alkaline phosphatase: 67  (06/24/2010)   Total bilirubin: 0.2  (06/24/2010)  Hypertension   Last Blood Pressure: 120 / 66  (08/26/2010)   Serum creatinine: 0.84  (08/05/2010)   BMP action: Ordered   Serum potassium 3.9  (08/05/2010)  Self-Management Support :   Personal Goals (by the  next clinic visit) :     Personal A1C goal: 7  (07/21/2009)     Personal blood pressure goal: 140/90  (07/21/2009)     Personal LDL goal: 70  (07/21/2009)    Patient will work on the following items until the next clinic visit to reach self-care goals:     Medications and monitoring: take my medicines every day, check my blood sugar, check my blood pressure, bring all of my medications to every visit, weigh myself weekly, examine my feet every day  (08/26/2010)     Eating: drink diet soda or water instead of juice or soda, eat more vegetables, use fresh or frozen vegetables, eat baked foods instead of fried foods, eat fruit for snacks and desserts, limit or avoid alcohol  (08/26/2010)     Activity: take a 30 minute walk every day, park at the far end of the parking lot  (08/26/2010)     Other: not able to walk like does when cooler weather - coughing  makes walking hard at times  (04/29/2010)    Diabetes self-management support: Written self-care plan, Education handout, Resources for patients handout  (08/26/2010)   Diabetes care plan printed   Diabetes education handout printed   Last diabetes self-management training by diabetes educator: 12/18/2009   Last medical nutrition therapy: 12/24/2008    Hypertension self-management support: Written self-care plan, Education handout, Resources for patients handout  (08/26/2010)   Hypertension self-care plan printed.   Hypertension education handout printed    Lipid self-management support: Written self-care plan, Education handout, Resources for patients handout  (08/26/2010)   Lipid self-care plan printed.   Lipid education handout printed      Resource handout printed.   Nursing Instructions: Diabetic foot exam today   Last LDL:                                                 64 (07/22/2009 9:17:00 PM)        Diabetic Foot Exam Foot Inspection Is there a history of a foot ulcer?              No Is there a foot ulcer now?               No Can the patient see the bottom of their feet?          Yes Are the shoes appropriate in style and fit?          Yes Is there swelling or an abnormal foot shape?          No Are the toenails long?                No Are the toenails thick?                No Are the toenails ingrown?              No Is there heavy callous build-up?              No Is there a claw toe deformity?                          No Is there elevated skin temperature?            No Is there limited ankle dorsiflexion?            No Is there foot or ankle muscle weakness?            No Do you have pain in calf while walking?           No      Pulse Check          Right Foot          Left Foot Posterior Tibial:        normal            normal Dorsalis Pedis:        normal            normal  High Risk Feet? Yes   10-g (5.07) Semmes-Weinstein Monofilament Test Performed by: Stanton Kidney Ditzler RN          Right Foot          Left Foot Visual Inspection     normal  normal Test Control      normal         normal Site 1         normal         normal Site 2         normal         normal Site 3         normal         normal Site 4         normal         normal Site 5         normal         normal Site 6         normal         normal Site 7         normal         normal Site 8         normal         normal Site 9         normal         normal Site 10         normal         normal  Impression      normal         normal

## 2010-11-05 NOTE — Miscellaneous (Signed)
Summary: Hospital Admission  INTERNAL MEDICINE ADMISSION HISTORY AND PHYSICAL  PCP: Dr. Comer Locket  CC: SOB  HPI:  Patient is a 60 year old female who is an erratic, difficult historian with PMH non-compliance, breast cancer, CAD, systolic and diastolic heart failure, CPAP, type II DM, and HTN who presents with a chief complaint of shortness of breath for the last few days.  During this time she endorses wheezes, fatigue, and a dry cough.  Associated with the cough is pleuritic bilateral lower lateral chest pain. Also some burning substernal pain.  Denies fever, chills, rhinorrhea, sore throat.  Endorses nausea without vomiting that has precluded her being able to eat for the past 2 days. Endorses headache since yesterday over the top center of her head, pressure-like and some right eye blurry vision.  Denies any change in hearing.  States she has some dizziness, but this is chronic.  Chronic constipation, last BM yesterday and normal.  Chronic hemoccult positive, dark stools.  Chronic leg swelling, better with elevation.    No new weakness or numbness or tingling.    Patient states she has been out of her metformin and norvasc for 3 days and her CPAP has been broken for a week.  She also states that she thought she was told to stop taking her clonidine in November, although in fact she should have continued it.  She was also taking her gabapentin as needed instead of scheduled.  In ED:  Labetolol 20 mg IVP, then 2 mg/min ggt, Morphine 4 mg IVP x 2, ASA 324 mg by mouth x 1, Zofran 4 mg IVPx1  ALLERGIES: ! BENADRYL ->tachycardia ! * FLU VACCINATION->rash percocet ARB - chronic dry cough  PAST MEDICAL HISTORY: Non-compliance Breast cancer, hx of, left 2006 (invasive papillary CA, ductal CA in situ). Status post partial mastectomy and lymph node disection and radiation x 34 cycles (at [facility]). Refused neoadjuvant chemotherapy. Last Mammogram (08/2010) with findings of  Scattered  fibroglandular densities.  No suspicious findings on the right.  On the left, just anterior to the scar, in addition to previously existing scattered dystrophic calcifications, there are new punctate calcifications. These appear in a linear distribution.  Multiple magnification views were performed of these and suggest that they are vascular or potentially fat necrosis. IMPRESSION: Probably benign, likely vascular calcifications. BI-RADS CATEGORY 3:  Probably benign finding(s) - short interval follow-up suggested in 6 months. CAD Cardiac cath 11/07 showed no ischemia, non-obstructing lesion. CHF (combined: systolic/diastolic) EF 40-45% by ECHO 04/08. patient of  Dr. Antoine Poche (Cardiology). Diabetes mellitus, type II-> last HgbA1C 7.1 (06/2010) Diabetic peripheral neuropathy->hx of noncompliance with Gabapentin Hypertension: Hx of HTN crisis: Last admitted to [facility] 3/11 (see record) GERD->diet controlled Hyperlipidemia ->no recent FLP (from 10/10: T. Chol 133, TG 78, HDL 53, LDL 64) Obesity -> BMI of 16.1 H/o colon polyp seen on Colonoscopy by Dr. Madilyn Fireman 1995(per pt) Repeat Colonoscopy by Dr. WRUE(45/40)                                   Isolated diverticulum in mid transverse colon.                                    Small non bleeding internal hemorrhoids  Repeat Colonoscopy every 5 years. Moderate OSA per [facility] study(02/16/07) ->not on C-PAP  Hx of hypothyroidism. No recent TSH. Last THS 3.9 (10/10) Anemia:  Baseline hgb in 2010  ~11.5, last 10.8 in Sept 2011; hemoccult positive but pt refuses GI w/u 2/2 finances  MEDICATIONS: METFORMIN HCL 1000 MG TABS (METFORMIN HCL) take 1 tablet by mouth two times a day - Patient was out of this for 3 days LANTUS 100 UNIT/ML SOLN (INSULIN GLARGINE) Inject 72 units once daily. METOPROLOL SUCCINATE 100 MG XR24H-TAB (METOPROLOL SUCCINATE) Take 1 tablet by mouth once a day KAY CIEL 20 MEQ PACK (POTASSIUM CHLORIDE) Take 1  tablet by mouth once a day LIPITOR 40 MG TABS (ATORVASTATIN CALCIUM) Take 1 tablet by mouth once a day ASPIRIN 81 MG TBEC (ASPIRIN) Take 1 tablet by mouth once a day NEURONTIN 300 MG CAPS (GABAPENTIN) Take 1 tablet by mouth three times a day after day 3. - patient was taking this PRN CLONIDINE HCL 0.2 MG TABS (CLONIDINE HCL) Take 1 tablet by mouth two times a day - Patient had self-d/c'd this CENTRUM  TABS (MULTIPLE VITAMINS-MINERALS) Take 1 tablet by mouth once a day IBUPROFEN 800 MG TABS (IBUPROFEN) take 1 tablet every 6 hours as needed for pain AMLODIPINE BESYLATE 10 MG TABS (AMLODIPINE BESYLATE) Take 1 tablet by mouth once a day - Patient was out of this for 3 days HYDROCHLOROTHIAZIDE 25 MG TABS (HYDROCHLOROTHIAZIDE) Take 1 tablet by mouth once a day CERON-DM 12.02-04-14 MG/5ML SYRP (PHENYLEPHRINE-CHLORPHEN-DM) 1-2 teaspoons every 6-8 hours for cough. LORATADINE 10 MG TABS (LORATADINE) Take 1 tablet by mouth two times a day as needed for allergies CYCLOBENZAPRINE HCL 5 MG TABS (CYCLOBENZAPRINE HCL) three times a day  SOCIAL HISTORY: Short-term disability. Divorced, lives alone. Denies tobacco, alcohol, drug use.  FAMILY HISTORY: Diabetes: father passed of its complications at 15 Lung cancer: mother died at 91 PE - Brother died of PE Breast cancer - sister died of breast cancer  ROS:per HPI   VITALS: T:220/123 -> 198/207  P:98>75  BP: 220/120 R: 20 O2SAT: 100% ON:2 L/min of O2 via [place] When on SDU:  P69 BP 164/94 RR25  100%onRA PHYSICAL EXAM: General:  obese, cooperative   Head:  normocephalic and atraumatic.   Eyes:  vision grossly intact, pupils equal, pupils round, pupils reactive to light, no injection and anicteric.   Mouth:  pharynx pink and moist, no erythema, and no exudates.   Neck:  supple, full ROM, bilateral tender submandibular lymphadenopathy   Lungs:  CTAB with normal work of breathing without wheeze or crackles CV: RRR, no Murmur. left breast is chronically moderately  tender.  Mild tenderness bilateral lower anterior chest wall; trace, non-tender pitting edema to bilateral mid-calves Abdomen: obese +bs, nt/nd MSK: no joint erythema, effusion or increased warmth to touch bilaterally. Neurologic:  alert & oriented X3, cranial nerves II-XII intact, strength normal in all extremities, sensation intact to light touch   Skin:  turgor normal and no rashes.   Psych:  Oriented X3, memory intact for recent and remote, flat affect, perseveration and depressed mood, patient saying "I just don't know how much I can take of this" and appearing to have poor judgement regarding her health decisions and rationale. No SI/HI  LABS:  POC: CKMB, POC                           1.3  1.0-8.0          ng/mL  Troponin I, POC                          0.10       h      0.00-0.09        ng/mL  Myoglobin, POC                           75.5              12-200           ng/mL   Troponin I                               0.19       h      0.00-0.06        ng/mL  Creatine Kinase, Total                   187        h      7-177            U/L  CK, MB                                   2.0               0.3-4.0          ng/mL  Relative Index                           1.1               0.0-2.5  Beta Natriuretic Peptide                 174.0      h      0.0-100.0        pg/mL  D-Dimer, Fibrin Derivatives              0.51       h      0.00-0.48      TCO2                                     24                0-100            mmol/L  Ionized Calcium                          1.15              1.12-1.32        mmol/L  Hemoglobin (HGB)                         11.6       l      12.0-15.0        g/dL  Hematocrit (HCT)  34.0       l      36.0-46.0        %  Sodium (NA)                              142               135-145          mEq/L  Potassium (K)                            3.4        l      3.5-5.1          mEq/L  Chloride                                 107                96-112           mEq/L  Glucose                                  87                70-99            mg/dL  BUN                                      16                6-23             mg/dL  Creatinine                               0.8               0.4-1.2          mg/dL    WBC                                      8.0               4.0-10.5         K/uL  RBC                                      3.90              3.87-5.11        MIL/uL  Hemoglobin (HGB)                         10.4       l      12.0-15.0        g/dL  Hematocrit (HCT)                         32.2       l  36.0-46.0        %  MCV                                      82.6              78.0-100.0       fL  MCH -                                    26.7              26.0-34.0        pg  MCHC                                     32.3              30.0-36.0        g/dL  RDW                                      18.3       h      11.5-15.5        %  Platelet Count (PLT)                     232               150-400          K/uL  Neutrophils, %                           39         l      43-77            %  Lymphocytes, %                           50         h      12-46            %  Monocytes, %                             9                 3-12             %  Eosinophils, %                           2                 0-5              %  Basophils, %                             0                 0-1              %  Neutrophils, Absolute                    3.1               1.7-7.7          K/uL  Lymphocytes, Absolute                    4.0               0.7-4.0          K/uL  Monocytes, Absolute                      0.7               0.1-1.0          K/uL  Eosinophils, Absolute                    0.2               0.0-0.7          K/uL  Basophils, Absolute                      0.0               0.0-0.1          K/uL  IMAGING:   CTA chest: IMPRESSION:   No evidence of pulmonary embolus. Extensive coronary artery  calcifications.  Borderline heart size.  Areas    of atelectasis in the lower lobes bilaterally.  CHEST - 2 VIEW:   Findings: Mild cardiomegaly stable.  Both lungs are clear.  No   evidence of pleural effusion.  No mass or adenopathy identified.  EKG: no acute findings.  ASSESSMENT AND PLAN: (1)Patient presents with an acute onset of dyspnea and BP of 220/123.  Patient with recent non-compliance with medications and also broken home CPAP machine.  List of differential diagnoses include:  a) HTN urgency: no signs of hypertensive encephalopathy, renal crises or other end-organ disease per physical exam and labs. Will check urine microalbumine (no recent testing since 2010 per chart review). b)CHF ->No acute pulmonary edema per exam and CXR with a BNP at her baseline; pt appears compensated. c) Aortic dissection ->no mediastinal widening on CXR;  d) Angina penctoris/MI->Mildly elevated troponin of 0.19 in a setting of CAD and CHF. ECG wtihout acute findings. This is likely due to a demnad ischemia secondary to hypertensive urgency.  However, will cycle CE x 2 more times to observe a trend ,in order to rule out an acute coronary insufficiency. Pt treated in the ED with Labetolol for HTN.  Will continue home lipitor, aspirin, and antihypertensives (except will change amlodipine to verapamil given patient's problems with LE swelling) e) WD of anti-HTN therapy. Abrupt discontinuation of a short-acting sympathetc blocker (clinidine) can lead to severe HTN and coronary ischemia. e) Acute increase in sympathetic activity due to autonomic dysfunction given a Dx of DM. check orthostatics. f) Pheochromocytoma: unlikely.  However, if patient able to become compliant and she continues to have these crises, then may consider to check cathecholamines g) PE: CTA negative - metoprolol XR by mouth daily - clonidine 0.2mg  by mouth two times a day - verapamil 120mg  by mouth daily - HCTZ 25mg  by mouth daily  ()  Anemia:  Baseline hgb in 2010  ~11.5, last 10.8 in Sept 2011, today 10.4.  Patient h/o colonic polyps, hemorrhoids. Has seen blood in her stool but has refused GI referral in past 2/2 finances.  Concern for malignancy given h/o breast cancer.   -  FOBT x3 - peripheral smear - consider anemia panel  () DIABETES MELLITUS, TYPE II :  Well controlled. Her last AIC was 7.1% in September 2011. Check HgbA1cContinue current regimen of metformin, lantus (half of home dose given poor by mouth yesterday and today), and aspirin.  Also with peripheral neuropathy - will continue home gabapentin.    () Hx of hypothyroidism per chart review. No recent TSH results. Will check TSH ->particularly in a setting of current HTN urgency.  () Hx of OSA. Currently not on C-PAP because home machine broken.  Will continue CPAP per RT.  () HLP: last FLP in 2010, will check FLP  () GERD: pt not on anything as outpatient. Will give protonix.  () symptoms of depression:  Patient appeared to have flat affect, perseveration about her health problems with poor judgement. Also has many health complaints with poor coping. Suspect that patient may have depression; this may very well be contributing greatly to her primary problem and non-compliance issues. - fluoxetine 20mg  by mouth daily - check TSH  ()VTE PROPH: Heparin Subcutaneously q 8 hours  ATTENDING PHYSICIAN: I discussed the case with the resident(s) as noted ad reviewed the resident's notes. I agree with the finding and plan - please refer to the attending physician note for more details.  Signature__________________________________________  Printed Name_______________________________________

## 2010-11-05 NOTE — Miscellaneous (Signed)
Summary: [facility]  [facility]   Imported By: Margie Billet 08/21/2010 14:21:45  _____________________________________________________________________  External Attachment:    Type:   Image     Comment:   External Document

## 2010-11-05 NOTE — Assessment & Plan Note (Signed)
Summary: ACUTE-BAD COLD-(BOGGALA)/CFB   Vital Signs:  Patient profile:   60 year old female Height:      61 inches (154.94 cm) Weight:      213.6 pounds (97.09 kg) BMI:     40.51 O2 Sat:      100 % on Room air Temp:     97.3 degrees F Pulse rate:   66 / minute BP sitting:   160 / 80  (right arm) Cuff size:   large  Vitals Entered By: Dorie Rank RN (April 29, 2010 9:02 AM)  O2 Flow:  Room air CC: cough for 3 months, continued "popping sensation" in right thigh -cough is dry and hacky - sneezing at times - chest starting to get sore- taking Zyrtec in case cough coming from allergies - no fevers or exudate noted Is Patient Diabetic? Yes Did you bring your meter with you today? Yes Pain Assessment Patient in pain? yes     Location: chest sore from cough Intensity: 4 Type: sore,burning Onset of pain  3 months - thigh Popping pain chronic but "getting worse" Nutritional Status BMI of > 30 = obese  Have you ever been in a relationship where you felt threatened, hurt or afraid?No   Does patient need assistance? Ambulation Normal Comments cough syrup helps some to sleep at night- states feels like sand in throat   Diabetic Foot Exam Foot Inspection Is there a history of a foot ulcer?              No Is there a foot ulcer now?              No Can the patient see the bottom of their feet?          Yes Are the shoes appropriate in style and fit?          Yes Is there swelling or an abnormal foot shape?          No Are the toenails long?                No Are the toenails thick?                No Are the toenails ingrown?              No Is there heavy callous build-up?              No Is there pain in the calf muscle (Intermittent claudication) when walking?    NoIs there a claw toe deformity?              No Is there elevated skin temperature?            No Is there limited ankle dorsiflexion?            No Is there foot or ankle muscle weakness?            No  Diabetic  Foot Care Education Patient educated on appropriate care of diabetic feet.  Comments: wearing sandals that are supportive and protective as possible without socks and closed toe shoes - soaks own feet   states her heels hurt when she walks at times -    10-g (5.07) Semmes-Weinstein Monofilament Test Performed by: Dorie Rank RN          Right Foot          Left Foot Visual Inspection     normal  normal Site 1         normal         normal Site 2         normal         normal Site 3         normal         normal Site 4         normal         normal Site 5         normal         normal Site 6         normal         normal Site 9         normal         normal  Impression      normal         normal  Legend:  Site 1 = Plantar aspect of first toe (center of pad) Site 2 = Plantar aspect of third toe (center of pad) Site 3 = Plantar aspect of fifth toe (center of pad) Site 4 = Plantar aspect of first metatarsal head Site 5 = Plantar aspect of third metatarsal head Site 6 = Plantar aspect of fifth metatarsal head Site 7 = Plantar aspect of medial midfoot Site 8 = Plantar aspect of lateral midfoot Site 9 = Plantar aspect of heel Site 10 = dorsal aspect of foot between the base of the first and second toes   Result is Abnormal if patient was unable to perceive the monofilament at site indicated.    Primary Care Provider:  Blondell Reveal MD  CC:  cough for 3 months and continued "popping sensation" in right thigh -cough is dry and hacky - sneezing at times - chest starting to get sore- taking Zyrtec in case cough coming from allergies - no fevers or exudate noted.  History of Present Illness: Patient is a 60 yo women with PMH as described in EMR most notable for recently for increased BP is here today for BP check, leg cramps, heel pain and acute cough.  Cough: Started 3 months ago, dry hacking cough with no sputum production, no agg. or relieving factors, cough is present  throughout the day, progressively getting worse, no fever, chills, nausea, vomiting. she was started on Lisinopril HCTZ combo in May. She is taking zyrtec and tussinex is helping abit. She says that she also does steam inhalations and saline gargles occasionally and that helps infrequently.  Leg cramps: Patient is having cramps in her thigh, atleast once a day, started 2-3 months ago and getting worse progressively, she describes it as contraction in her thigh muscle which is relieved by streching.  Heel pains: She said she has had heel spurs in the past and used to see an orthopedecian but stopped following as it improved. No sugical intervention has been done for it. It seems the pain has come back.  DM: hba1c is 7.5 and foot exam was done today.  BP: is improved since last time but still above goal, she is taking all her meds regularly. She says that her BP fluctuates all the time.   She denies any new sicknesses or hospitalizations, no chest pain episodes, no fevers, no chills, no abdominal or urinary concerns. No recent changes in appetite, weight.   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 Take 1 Tablet By Mouth Two Times A Day 2)  Lantus 100 Unit/ml Soln (Insulin Glargine) .... Inject 72 Units Once Daily. 3)  Bd Insulin Syringe Microfine 28g X 1/2" 0.5 Ml  Misc (Insulin Syringe-Needle U-100) .... As Directed 4)  Freestyle Test  Strp (Glucose Blood) .... Use To Check Blood Sugar Twice Daily 5)  Lancets 30g  Misc (Lancets) .... Use To Check Blood Sugar Twice A Day 6)  Toprol Xl 50 Mg Xr24h-Tab (Metoprolol Succinate) .... Take 2 Tablets By Mouth Two Times A Day 7)  Kay Ciel 20 Meq Pack (Potassium Chloride) .... Take 1 Tablet By Mouth Once A Day 8)  Lipitor 40 Mg Tabs (Atorvastatin Calcium) .... Take 1 Tablet By Mouth Once A Day 9)  Aspirin 81 Mg Tbec (Aspirin) .... Take 1 Tablet By Mouth Once A Day 10)  Neurontin 300 Mg Caps (Gabapentin) .... Take 1 Tablet Day 1, Then 1 Tablet Two Times A Day At Day 2, Then Start To Take 1 Tablet By Mouth Three Times A Day After Day 3. 11)  Clonidine Hcl 0.1 Mg Tabs (Clonidine Hcl) .... Take 1 Tablet By Mouth Two Times A Day 12)  Centrum  Tabs (Multiple Vitamins-Minerals) .... Take 1 Tablet By Mouth Once A Day 13)  Ibuprofen 800 Mg Tabs (Ibuprofen) .... Take 1 Tablet Every 6 Hours As Needed For Pain 14)  Amlodipine Besylate 10 Mg Tabs (Amlodipine Besylate) .... Take 1 Tablet By Mouth Once A Day 15)  Lisinopril-Hydrochlorothiazide 20-25 Mg Tabs (Lisinopril-Hydrochlorothiazide) .... Take 1 Tablet By Mouth Once A Day  Current Medications (verified): 1)  Metformin Hcl 1000 Mg Tabs (Metformin Hcl) .... Take 1 Tablet By Mouth Two Times A Day 2)  Lantus 100 Unit/ml Soln (Insulin Glargine) .... Inject 72 Units Once Daily. 3)  Bd Insulin Syringe Microfine 28g X 1/2" 0.5 Ml  Misc (Insulin  Syringe-Needle U-100) .... As Directed 4)  Freestyle Test  Strp (Glucose Blood) .... Use To Check Blood Sugar Twice Daily 5)  Lancets 30g  Misc (Lancets) .... Use To Check Blood Sugar Twice A Day 6)  Metoprolol Succinate 100 Mg Xr24h-Tab (Metoprolol Succinate) .... Take 1 Tablet By Mouth Once A Day 7)  Kay Ciel 20 Meq Pack (Potassium Chloride) .... Take 1 Tablet By Mouth Once A Day 8)  Lipitor 40 Mg Tabs (Atorvastatin Calcium) .... Take 1 Tablet By Mouth Once A Day 9)  Aspirin 81 Mg Tbec (Aspirin) .... Take 1 Tablet  By Mouth Once A Day 10)  Neurontin 300 Mg Caps (Gabapentin) .... Take 1 Tablet Day 1, Then 1 Tablet Two Times A Day At Day 2, Then Start To Take 1 Tablet By Mouth Three Times A Day After Day 3. 11)  Clonidine Hcl 0.2 Mg Tabs (Clonidine Hcl) .... Take 1 Tablet By Mouth Two Times A Day 12)  Centrum  Tabs (Multiple Vitamins-Minerals) .... Take 1 Tablet By Mouth Once A Day 13)  Ibuprofen 800 Mg Tabs (Ibuprofen) .... Take 1 Tablet Every 6 Hours As Needed For Pain 14)  Amlodipine Besylate 10 Mg Tabs (Amlodipine Besylate) .... Take 1 Tablet By Mouth Once A Day 15)  Hydrochlorothiazide 25 Mg Tabs (Hydrochlorothiazide) .... Take 1 Tablet By Mouth Once A Day 16)  Ceron-Dm 12.02-04-14 Mg/9ml Syrp (Phenylephrine-Chlorphen-Dm) .Marland Kitchen.. 1-2 Teaspoons Every 6-8 Hours For Cough. 17)  Loratadine 10 Mg Tabs (Loratadine) .... Take 1 Tablet By Mouth Two Times A Day As Needed For Allergies  Allergies (verified): 1)  ! Benadryl 2)  ! * Flu Vaccination  Past History:  Past Medical History: Last updated: 12/18/2009 Breast cancer, hx of, left 2006 (invasive papillary CA, ductal CA in situ) Cardiac cath 11/07 showed no ischemia, no significant disease. EF 55% by ECHO 11/07 Diabetes mellitus, type II Hypertension GERD Hyperlipidemia Obesity H/o colon polyp seen on Colonoscopy by Dr. Madilyn Fireman 1995(per pt) Repeat Colonoscopy by Dr. ZOXW(96/04)                                   Isolated diverticulum in mid  transverse colon.                                    Small non bleeding internal hemorrhoids                                   Repeat Colonoscopy every 5 years. Moderate OSA on sleep study(02/16/07) HTN crisis admitted to [facility] 3/11 (see record)  Past Surgical History: Last updated: 04/18/2008 S/P surgery and chemotherapy for lt breast cancer. colon polypectomy -2004. hysterectomy 1985.  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 Family History Diabetes 1st degree relative: father passed of its complications at 27 Family History Lung cancer: mother died at 40 Brother died of a PE Sister died of breast CA  Social History: Reviewed history from 01/07/2009 and no changes required. 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   Diabetes Management Exam:    Foot Exam (with socks and/or shoes not present):        Sensory-Monofilament:          Left foot: normal          Right foot: normal   Impression & Recommendations:  Problem # 1:  PAIN IN JOINT, ANKLE AND FOOT (ICD-719.47) Assessment Deteriorated Patient is having pain in her ankles and heel. She had been diagnosed with bone spurs and was seeing an orthopedcian untill 3 years ago for the same. She does not remeber the name of the orthopedician and we do not have any records for the same. I will refer her to sports medicine clinic for evaluation and managment of her bone spurs.  Orders: Sports Medicine (Sports Med)  Problem # 2:  DIABETES MELLITUS, TYPE II (ICD-250.00) Assessment: Unchanged HBa1c is 7.5 today and slightly above goal. Her meter was not working properly and we were not able to download any readings at this time. This is something which we can work upon the next visit. [place]reday on ACEi's. Eye exam uptodate. Foot exam done today.  Patient also complains of characteristic glove and stockin neuropathy and I will start her on Gabapentin for that.  Her updated medication list for this problem includes:    Metformin Hcl 1000 Mg Tabs (Metformin hcl) .Marland Kitchen... Take 1 tablet by mouth two times a day    Lantus 100 Unit/ml Soln (Insulin glargine) ..... Inject 72 units once daily.    Aspirin 81 Mg Tbec (Aspirin) .Marland Kitchen... Take 1 tablet by mouth once a day  Labs Reviewed: Creat: 0.80 (12/04/2009)     Last Eye Exam: No diabetic retinopathy. OU   Visual acuity OD : 20/50     Visual acuity OS :  20/25    Intraocular pressure OD:  18    Intraocular pressure OS:   18    (02/26/2009) Reviewed HgBA1c results: 7.5 (04/29/2010)  7.4 (01/30/2010)  Problem # 3:  HYPERLIPIDEMIA (ICD-272.4) Assessment: Unchanged At goal cont current meds. Her updated medication list for this problem includes:    Lipitor 40 Mg Tabs (Atorvastatin calcium) .Marland Kitchen... Take 1 tablet by mouth once a day  Labs Reviewed: SGOT: 18 (07/22/2009)   SGPT: 17 (07/22/2009)    HDL:53 (07/22/2009), 58 (11/01/2008)  LDL:64 (07/22/2009), 47 (11/01/2008)  Chol:133 (07/22/2009), 123 (11/01/2008)  Trig:78 (07/22/2009), 89 (11/01/2008)  Problem # 4:  HYPERTENSION (ICD-401.9) Assessment: Deteriorated Her BP is still high today and even after rechecking it was 160/84. I will increase her Clonidine to 0.2 two times a day. I will also increase her dose of Metoprolol to 100. Follow up in 4 weeks for recheck. I discontinued Lisinopril and HCTZ combo and started her on just HCTZ as she is complaing of cough ever since she was started on this combo. I will increase her Clonidine to 0.2 two times a day and also increase her dose of toprol to 100. Will follow up in 3-4 weeks for recheck of her BP.  Her updated medication list for this problem includes:    Metoprolol Succinate 100 Mg Xr24h-tab (Metoprolol succinate) .Marland Kitchen... Take 1 tablet by mouth once a day    Clonidine Hcl 0.2 Mg Tabs (Clonidine hcl) .Marland Kitchen... Take 1 tablet by mouth two times a day    Amlodipine Besylate 10 Mg Tabs (Amlodipine besylate) .Marland Kitchen... Take 1  tablet by mouth once a day    Hydrochlorothiazide 25 Mg Tabs (Hydrochlorothiazide) .Marland Kitchen... Take 1 tablet by mouth once a day  BP today: 160/80 Prior BP: 187/98 (02/17/2010)  Labs Reviewed: K+: 4.1 (12/04/2009) Creat: : 0.80 (12/04/2009)   Chol: 133 (07/22/2009)   HDL: 53 (07/22/2009)   LDL: 64 (07/22/2009)   TG: 78 (07/22/2009)  Problem # 5:  COUGH (ICD-786.2) Assessment: New I made a change in her HTN meds by d/c lisinopril. I discussed conservative measures like warm saline gargles and steam inhalation.  Patient was adamant that she she does everything that we discussed already and want to start with a medication for it instead. I will start her on loratidine for now and wills ee her back in 3-4 weeks if this problem persists. I will also start her on mucinex dm for now.  Complete Medication List: 1)  Metformin Hcl 1000 Mg Tabs (Metformin hcl) .... Take 1 tablet by  mouth two times a day 2)  Lantus 100 Unit/ml Soln (Insulin glargine) .... Inject 72 units once daily. 3)  Bd Insulin Syringe Microfine 28g X 1/2" 0.5 Ml Misc (Insulin syringe-needle u-100) .... As directed 4)  Freestyle Test Strp (Glucose blood) .... Use to check blood sugar twice daily 5)  Lancets 30g Misc (Lancets) .... Use to check blood sugar twice a day 6)  Metoprolol Succinate 100 Mg Xr24h-tab (Metoprolol succinate) .... Take 1 tablet by mouth once a day 7)  Kay Ciel 20 Meq Pack (Potassium chloride) .... Take 1 tablet by mouth once a day 8)  Lipitor 40 Mg Tabs (Atorvastatin calcium) .... Take 1 tablet by mouth once a day 9)  Aspirin 81 Mg Tbec (Aspirin) .... Take 1 tablet by mouth once a day 10)  Neurontin 300 Mg Caps (Gabapentin) .... Take 1 tablet day 1, then 1 tablet two times a day at day 2, then start to take 1 tablet by mouth three times a day after day 3. 11)  Clonidine Hcl 0.2 Mg Tabs (Clonidine hcl) .... Take 1 tablet by mouth two times a day 12)  Centrum Tabs (Multiple vitamins-minerals) .... Take 1 tablet by mouth once a day 13)  Ibuprofen 800 Mg Tabs (Ibuprofen) .... Take 1 tablet every 6 hours as needed for pain 14)  Amlodipine Besylate 10 Mg Tabs (Amlodipine besylate) .... Take 1 tablet by mouth once a day 15)  Hydrochlorothiazide 25 Mg Tabs (Hydrochlorothiazide) .... Take 1 tablet by mouth once a day 16)  Ceron-dm 12.02-04-14 Mg/52ml Syrp (Phenylephrine-chlorphen-dm) .Marland Kitchen.. 1-2 teaspoons every 6-8 hours for cough. 17)  Loratadine 10 Mg Tabs (Loratadine) .... Take 1 tablet by mouth two times a day as needed for allergies  Other Orders: T-Hgb A1C (in-house) ([id number]) T- Capillary Blood Glucose ([id number])  Patient Instructions: 1)  Please schedule a follow-up appointment in 1 month with your PCP. 2)  Please use staem inhalation and saline gargles 2-3 times day for relief of your allergy symptoms. 3)  Try to do streching exercises and keep yourself hydrated for relief from lef  cramps. 4)  Low salt intake. 5)  It is important that you exercise regularly at least 20 minutes 5 times a week. If you develop chest pain, have severe difficulty breathing, or feel very tired , stop exercising immediately and seek medical attention. 6)  You need to lose weight. Consider a lower calorie diet and regular exercise.  7)  Take an Aspirin every day. Prescriptions: NEURONTIN 300 MG CAPS (GABAPENTIN) Take 1 tablet day 1,  then 1 tablet two times a day at day 2, then start to Take 1 tablet by mouth three times a day after day 3.  #100 x 2   Entered and Authorized by:   Lars Mage MD   Signed by:   Lars Mage MD on 04/29/2010   Method used:   Print then Give to Patient   RxID:   [id number] CERON-DM 12.02-04-14 MG/5ML SYRP (PHENYLEPHRINE-CHLORPHEN-DM) 1-2 teaspoons every 6-8 hours for cough.  #90 day supp x 1   Entered and Authorized by:   Lars Mage MD   Signed by:   Lars Mage MD on 04/29/2010   Method used:   Print then Give to Patient   RxID:   [id number] LORATADINE 10 MG TABS (LORATADINE) Take 1 tablet by mouth two times a day as needed for allergies  #60 x 1   Entered and Authorized by:   Lars Mage MD   Signed by:   Lars Mage MD on 04/29/2010   Method used:   Print then Give to Patient   RxID:   [id number] HYDROCHLOROTHIAZIDE 25 MG TABS (HYDROCHLOROTHIAZIDE) Take 1 tablet by mouth once a day  #31 x 11   Entered and Authorized by:   Lars Mage MD   Signed by:   Lars Mage MD on 04/29/2010   Method used:   Electronically to        [id number] (retail)       [address]       [place], [place]  [address]       Ph: [phone]       Fax: [phone]   RxID:   [id number] CLONIDINE HCL 0.2 MG TABS (CLONIDINE HCL) Take 1 tablet by mouth two times a day  #62 x 11   Entered and Authorized by:   Lars Mage MD   Signed by:   Lars Mage MD on 04/29/2010   Method used:   Electronically to        [id number] (retail)       [address]       [place], [place]  [address]       Ph: [phone]       Fax: [phone]   RxID:   [id number] METOPROLOL SUCCINATE 100 MG XR24H-TAB (METOPROLOL SUCCINATE) Take 1 tablet by mouth once a day  #31 x 11   Entered and Authorized by:   Lars Mage MD   Signed by:   Lars Mage MD on 04/29/2010   Method used:   Electronically to        [id number] (retail)       [address]       [place], [place]  [address]       Ph: [phone]       Fax: [phone]   RxID:   [id number]    Prevention & Chronic Care Immunizations   Influenza vaccine: Not documented   Influenza vaccine deferral: Not indicated  (01/30/2010)    Tetanus booster: 04/10/2009: Td    Pneumococcal vaccine: Not documented   Pneumococcal vaccine deferral: Refused  (03/25/2009)  Colorectal Screening   Hemoccult: Not documented   Hemoccult action/deferral: Not indicated  (01/30/2010)    Colonoscopy: Not documented   Colonoscopy action/deferral: Not indicated  (01/30/2010)   Colonoscopy due: 02/11/2010  Other Screening   Pap smear: Not documented   Pap smear action/deferral: Not indicated S/P hysterectomy  (03/25/2009)    Mammogram: BI-RADS CATEGORY 2:  Benign finding(s).^MM DIGITAL DIAGNOSTIC BILAT  (04/01/2009)   Mammogram due: 01/2009   Smoking status: never  (04/29/2010)  Diabetes Mellitus   HgbA1C: 7.5  (04/29/2010)   HgbA1C action/deferral: Ordered  (04/29/2010)   Hemoglobin A1C due: 10/13/2009    Eye exam: No diabetic retinopathy. OU   Visual acuity OD : 20/50     Visual acuity OS :  20/25    Intraocular pressure OD:  18    Intraocular pressure OS:   18     (02/26/2009)   Eye exam due: 02/26/2010    Foot exam: yes  (04/29/2010)   Foot exam action/deferral: Do today   High risk foot: No  (11/01/2008)   Foot care education: Done  (04/29/2010)   Foot exam due: 03/25/2010    Urine  microalbumin/creatinine ratio: 21.6  (04/10/2009)   Urine microalbumin action/deferral: Refused   Urine microalbumin/cr due: 11/01/2009  Lipids   Total Cholesterol: 133  (07/22/2009)   LDL: 64  (07/22/2009)   LDL Direct: Not documented   HDL: 53  (07/22/2009)   Triglycerides: 78  (07/22/2009)   Lipid panel due: 03/25/2010    SGOT (AST): 18  (07/22/2009)   SGPT (ALT): 17  (07/22/2009)   Alkaline phosphatase: 69  (07/22/2009)   Total bilirubin: 0.3  (07/22/2009)  Hypertension   Last Blood Pressure: 160 / 80  (04/29/2010)   Serum creatinine: 0.80  (12/04/2009)   BMP action: Ordered   Serum potassium 4.1  (12/04/2009)  Self-Management Support :   Personal Goals (by the next clinic visit) :     Personal A1C goal: 7  (07/21/2009)     Personal blood pressure goal: 140/90  (07/21/2009)     Personal LDL goal: 70  (07/21/2009)    Patient will work on the following items until the next clinic visit to reach self-care goals:     Medications and monitoring: take my medicines every day, check my blood sugar, check my blood pressure, bring all of my medications to every visit, examine my feet every day  (04/29/2010)     Eating: drink diet soda or water instead of juice or soda, eat more vegetables, use fresh or frozen vegetables, eat foods that are low in salt, eat baked foods instead of fried foods, eat fruit for snacks and desserts  (04/29/2010)     Activity: take a 30 minute walk every day  (04/29/2010)     Other: not able to walk like does when cooler weather - coughing makes walking hard at times  (04/29/2010)    Diabetes self-management support: Pre-printed educational material, Resources for patients handout, Written self-care plan  (04/29/2010)   Diabetes care plan printed   Last diabetes self-management training by diabetes educator: 12/18/2009   Last medical nutrition therapy: 12/24/2008    Hypertension self-management support: Pre-printed educational material, Resources for  patients handout, Written self-care plan  (04/29/2010)   Hypertension self-care plan printed.    Lipid self-management support: Pre-printed educational material, Resources for patients handout, Written self-care plan  (04/29/2010)   Lipid self-care plan printed.      Resource handout printed.   Nursing Instructions: HgbA1C today (see order) CBG today (see order) Diabetic foot exam today    Laboratory Results   Blood Tests   Date/Time Received: April 29, 2010 9:24 AM Date/Time Reported: Alric Quan  April 29, 2010 9:24 AM   HGBA1C: 7.5%   (Normal Range: Non-Diabetic - 3-6%   Control Diabetic - 6-8%) CBG Fasting:: 114mg /dL

## 2010-11-05 NOTE — Progress Notes (Signed)
Summary: refill/ hla  Phone Note Refill Request Message from:  Fax from Pharmacy on January 15, 2010 3:03 PM  Refills Requested: Medication #1:  TOPROL XL 50 MG XR24H-TAB Take 1 tablet by mouth two times a day   Last Refilled: 1/7 Initial call taken by: Marin Roberts RN,  January 15, 2010 3:03 PM  Follow-up for Phone Call       Follow-up by: Blondell Reveal MD,  January 16, 2010 7:06 AM    Prescriptions: TOPROL XL 50 MG XR24H-TAB (METOPROLOL SUCCINATE) Take 1 tablet by mouth two times a day  #60 x 6   Entered and Authorized by:   Blondell Reveal MD   Signed by:   Blondell Reveal MD on 01/16/2010   Method used:   Faxed to ...       [facility] (retail)       [address] [place], [place]  [address]       Ph: [phone]       Fax: [phone]   RxID:   [id number]

## 2010-11-05 NOTE — Progress Notes (Signed)
Summary: phone/gg  Phone Note Call from Patient   Summary of Call: Pt admitted to [facility] for hypertention.  She was d/c yesterday and needs f/u. Will see today Initial call taken by: Merrie Roof RN,  December 12, 2009 11:07 AM

## 2010-11-05 NOTE — Assessment & Plan Note (Signed)
Summary: ACUTE-ER/FU-WEAK(BOGGALA)/CFB   Vital Signs:  Patient profile:   60 year old female Height:      61 inches Weight:      215.0 pounds BMI:     40.77 Temp:     98.3 degrees F Pulse rate:   72 / minute BP sitting:   128 / 87  (right arm)  Vitals Entered By: Filomena Jungling NT II (July 08, 2010 2:09 PM) CC: ER FOLLOW-UP /  Is Patient Diabetic? Yes Did you bring your meter with you today? Yes Nutritional Status BMI of > 30 = obese  Have you ever been in a relationship where you felt threatened, hurt or afraid?No   Does patient need assistance? Functional Status Self care Ambulation Normal   Primary Care Angelyna Henderson:  Blondell Reveal MD  CC:  ER FOLLOW-UP / .  History of Present Illness: 60 y/o woman with DM , HTN, HLD, OSA coems to the clinic complaining of high BP, pain in both legs  high BP- she says her BP runs as high as 230-240 usually at home. she takes her meds regulalrly. 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 never seen by orthopedic doctir.   Current Medications (verified): 1)  Metformin Hcl 1000 Mg Tabs (Metformin Hcl) .... Take 1 Tablet By Mouth Two Times A Day 2)  Lantus 100 Unit/ml Soln (Insulin Glargine) .... Inject 72 Units Once Daily. 3)  Bd Insulin Syringe Microfine 28g X 1/2" 0.5 Ml  Misc (Insulin Syringe-Needle U-100) .... As Directed 4)  Freestyle Test  Strp (Glucose Blood) .... Use To Check Blood Sugar Twice Daily 5)  Lancets 30g  Misc (Lancets) .... Use To Check Blood Sugar Twice A Day 6)  Metoprolol Succinate 100 Mg Xr24h-Tab (Metoprolol Succinate) .... Take 1 Tablet By Mouth Once A Day 7)  Kay Ciel 20 Meq Pack (Potassium Chloride) .... Take 1 Tablet By Mouth Once A Day 8)  Lipitor 40 Mg Tabs (Atorvastatin Calcium)  .... Take 1 Tablet By Mouth Once A Day 9)  Aspirin 81 Mg Tbec (Aspirin) .... Take 1 Tablet By Mouth Once A Day 10)  Neurontin 300 Mg Caps (Gabapentin) .... Take 1 Tablet By Mouth Three Times A Day After Day 3. 11)  Clonidine Hcl 0.2 Mg Tabs (Clonidine Hcl) .... Take 1 Tablet By Mouth Two Times A Day 12)  Centrum  Tabs (Multiple Vitamins-Minerals) .... Take 1 Tablet By Mouth Once A Day 13)  Ibuprofen 800 Mg Tabs (Ibuprofen) .... Take 1 Tablet Every 6 Hours As Needed For Pain 14)  Amlodipine Besylate 10 Mg Tabs (Amlodipine Besylate) .... Take 1 Tablet By Mouth Once A Day 15)  Hydrochlorothiazide 25 Mg Tabs (Hydrochlorothiazide) .... Take 1 Tablet By Mouth Once A Day 16)  Ceron-Dm 12.02-04-14 Mg/38ml Syrp (Phenylephrine-Chlorphen-Dm) .Marland Kitchen.. 1-2 Teaspoons Every 6-8 Hours For Cough. 17)  Loratadine 10 Mg Tabs (Loratadine) .... Take 1 Tablet By Mouth Two Times A Day As Needed For Allergies  Allergies (verified): 1)  ! Benadryl 2)  ! * Flu Vaccination   Impression & Recommendations:  Problem # 1:  ANEMIA (ICD-285.9) continues to see blood in stool, refuses GI referral due to financial prob. had polyps removed in the past, has hemorrhoids too but concern for  malignancy given h/o breast cancer. . monitor CBC periodically. Fe tablets   Hgb: 10.8 (06/24/2010)   Hct: 35.1 (06/24/2010)   Platelets: 281 (06/24/2010) RBC: 4.16 (06/24/2010)   RDW: 18.2 (06/24/2010)   WBC: 8.7 (06/24/2010) MCV: 84.4 (06/24/2010)   MCHC: 30.8 (06/24/2010) B12: 1067 (08/15/2008)   TSH: 3.055 (07/03/2010)  Problem # 2:  LOW BACK PAIN SYNDROME (ICD-724.2) orthopedic referral  Her updated medication list for this problem includes:    Aspirin 81 Mg Tbec (Aspirin) .Marland Kitchen... Take 1 tablet by mouth once a day    Ibuprofen 800 Mg Tabs (Ibuprofen) .Marland Kitchen... Take 1 tablet every 6 hours as needed for pain  Orders: Orthopedic Referral (Ortho)  Problem # 3:  HYPERTENSION (ICD-401.9) well controlled  Her updated medication list for this  problem includes:    Metoprolol Succinate 100 Mg Xr24h-tab (Metoprolol succinate) .Marland Kitchen... Take 1 tablet by mouth once a day    Clonidine Hcl 0.2 Mg Tabs (Clonidine hcl) .Marland Kitchen... Take 1 tablet by mouth two times a day    Amlodipine Besylate 10 Mg Tabs (Amlodipine besylate) .Marland Kitchen... Take 1 tablet by mouth once a day    Hydrochlorothiazide 25 Mg Tabs (Hydrochlorothiazide) .Marland Kitchen... Take 1 tablet by mouth once a day  BP today: 128/87 Prior BP: 187/84 (07/02/2010)  Labs Reviewed: K+: 4.0 (06/24/2010) Creat: : 0.81 (06/24/2010)   Chol: 133 (07/22/2009)   HDL: 53 (07/22/2009)   LDL: 64 (07/22/2009)   TG: 78 (07/22/2009)  Problem # 4:  DIABETIC PERIPHERAL NEUROPATHY (ICD-250.60) patient was not taking neuronitn at prescribed dose. instruction give,   Her updated medication list for this problem includes:    Metformin Hcl 1000 Mg Tabs (Metformin hcl) .Marland Kitchen... Take 1 tablet by mouth two times a day    Lantus 100 Unit/ml Soln (Insulin glargine) ..... Inject 72 units once daily.    Aspirin 81 Mg Tbec (Aspirin) .Marland Kitchen... Take 1 tablet by mouth once a day  Complete Medication List: 1)  Metformin Hcl 1000 Mg Tabs (Metformin hcl) .... Take 1 tablet by mouth two times a day 2)  Lantus 100 Unit/ml Soln (Insulin glargine) .... Inject 72 units once daily. 3)  Bd Insulin Syringe Microfine 28g X 1/2" 0.5 Ml Misc (Insulin syringe-needle u-100) .... As directed 4)  Freestyle Test Strp (Glucose blood) .... Use to check blood sugar twice daily 5)  Lancets 30g Misc (Lancets) .... Use to check blood sugar twice a day 6)  Metoprolol Succinate 100 Mg Xr24h-tab (Metoprolol succinate) .... Take 1 tablet by mouth once a day 7)  Kay Ciel 20 Meq Pack (Potassium chloride) .... Take 1 tablet by mouth once a day 8)  Lipitor 40 Mg Tabs (Atorvastatin calcium) .... Take 1 tablet by mouth once a day 9)  Aspirin 81 Mg Tbec (Aspirin) .... Take 1 tablet by mouth once a day 10)  Neurontin 300 Mg Caps (Gabapentin) .... Take 1 tablet by mouth three  times a day after day 3. 11)  Clonidine Hcl 0.2 Mg Tabs (Clonidine hcl) .... Take 1 tablet by mouth two times a day 12)  Centrum Tabs (Multiple vitamins-minerals) .... Take 1 tablet by mouth once a day 13)  Ibuprofen 800 Mg Tabs (Ibuprofen) .... Take 1 tablet every 6 hours as needed for pain 14)  Amlodipine Besylate 10 Mg Tabs (Amlodipine besylate) .... Take 1 tablet by mouth once a day 15)  Hydrochlorothiazide 25 Mg Tabs (Hydrochlorothiazide) .... Take 1 tablet by mouth once a day 16)  Ceron-dm 12.02-04-14 Mg/54ml Syrp (Phenylephrine-chlorphen-dm) .Marland Kitchen.. 1-2  teaspoons every 6-8 hours for cough. 17)  Loratadine 10 Mg Tabs (Loratadine) .... Take 1 tablet by mouth two times a day as needed for allergies  Patient Instructions: 1)  Please schedule a follow-up appointment in 2 months. 2)  Schedule a colonoscopy/sigmoidoscopy to help detect colon cancer.   Prevention & Chronic Care Immunizations   Influenza vaccine: Not documented   Influenza vaccine deferral: Not indicated  (01/30/2010)    Tetanus booster: 04/10/2009: Td    Pneumococcal vaccine: Not documented   Pneumococcal vaccine deferral: Refused  (03/25/2009)  Colorectal Screening   Hemoccult: Not documented   Hemoccult action/deferral: Not indicated  (01/30/2010)    Colonoscopy: Not documented   Colonoscopy action/deferral: Not indicated  (01/30/2010)   Colonoscopy due: 02/11/2010  Other Screening   Pap smear: Not documented   Pap smear action/deferral: Not indicated S/P hysterectomy  (03/25/2009)    Mammogram: BI-RADS CATEGORY 2:  Benign finding(s).^MM DIGITAL DIAGNOSTIC BILAT  (04/01/2009)   Mammogram due: 01/2009   Smoking status: never  (07/02/2010)  Diabetes Mellitus   HgbA1C: 7.1  (07/02/2010)   HgbA1C action/deferral: Ordered  (04/29/2010)   Hemoglobin A1C due: 10/13/2009    Eye exam: No diabetic retinopathy. OU   Visual acuity OD : 20/50     Visual acuity OS :  20/25    Intraocular pressure OD:  18      Intraocular pressure OS:   18     (02/26/2009)   Eye exam due: 02/26/2010    Foot exam: yes  (04/29/2010)   Foot exam action/deferral: Do today   High risk foot: No  (11/01/2008)   Foot care education: Done  (04/29/2010)   Foot exam due: 03/25/2010    Urine microalbumin/creatinine ratio: 21.6  (04/10/2009)   Urine microalbumin action/deferral: Refused   Urine microalbumin/cr due: 11/01/2009  Lipids   Total Cholesterol: 133  (07/22/2009)   LDL: 64  (07/22/2009)   LDL Direct: Not documented   HDL: 53  (07/22/2009)   Triglycerides: 78  (07/22/2009)   Lipid panel due: 03/25/2010    SGOT (AST): 16  (06/24/2010)   SGPT (ALT): 12  (06/24/2010)   Alkaline phosphatase: 67  (06/24/2010)   Total bilirubin: 0.2  (06/24/2010)  Hypertension   Last Blood Pressure: 128 / 87  (07/08/2010)   Serum creatinine: 0.81  (06/24/2010)   BMP action: Ordered   Serum potassium 4.0  (06/24/2010)  Self-Management Support :   Personal Goals (by the next clinic visit) :     Personal A1C goal: 7  (07/21/2009)     Personal blood pressure goal: 140/90  (07/21/2009)     Personal LDL goal: 70  (07/21/2009)    Patient will work on the following items until the next clinic visit to reach self-care goals:     Medications and monitoring: take my medicines every day, check my blood sugar  (07/08/2010)     Eating: eat more vegetables, use fresh or frozen vegetables, eat foods that are low in salt, eat baked foods instead of fried foods, eat fruit for snacks and desserts  (07/08/2010)     Activity: take a 30 minute walk every day  (07/08/2010)     Other: not able to walk like does when cooler weather - coughing makes walking hard at times  (04/29/2010)    Diabetes self-management support: Education handout, Resources for patients handout  (07/08/2010)   Diabetes education handout printed   Last diabetes self-management training by diabetes educator: 12/18/2009   Last medical nutrition  therapy: 12/24/2008     Hypertension self-management support: Education handout, Resources for patients handout  (07/08/2010)   Hypertension education handout printed    Lipid self-management support: Education handout, Resources for patients handout  (07/08/2010)     Lipid education handout printed      Resource handout printed.

## 2010-11-05 NOTE — Progress Notes (Signed)
Summary: PREVENTIVE COLONOSCOPY  Phone Note Outgoing Call   Summary of Call: Patient was to be sch/in November for a colonoscopy and patient has put this on hold because of other medical issues per Nurse Mamie's notes in the EMR. Initial call taken by: Shon Hough,  August 21, 2010 3:45 PM

## 2010-11-05 NOTE — Letter (Signed)
Summary: Meter[employer]  MeterDownLoad   Imported By: Florinda Marker 12/24/2009 15:30:24  _____________________________________________________________________  External Attachment:    Type:   Image     Comment:   External Document

## 2010-11-05 NOTE — Progress Notes (Signed)
Summary: walk-in/gp  Phone Note Call from Patient   Summary of Call: Pt. here at the clinic requesting antibiotic. States she has a chronic cough; started in August.  States it's worse at night, productive - clear; makes her ribs/chest hurt. Also she has "post nasal drip". Last seen by Dr. Comer Locket July 27.   Informed pt. she needs to be seen for antibiotic. I will talk to the Attending. Initial call taken by: Chinita Pester RN,  June 23, 2010 2:52 PM  Follow-up for Phone Call        I talked to Dr. Coralee Pesa; pt. will be seen by Dr. Anselm Jungling at 4:00PM. Follow-up by: Chinita Pester RN,  June 23, 2010 3:07 PM

## 2010-11-05 NOTE — Assessment & Plan Note (Signed)
Summary: ACUTE/HAVING PROBLEMS W/HANDS/CH   Vital Signs:  Patient profile:   60 year old female Height:      61 inches (154.94 cm) Weight:      216.7 pounds (98.50 kg) BMI:     41.09 Temp:     97.5 degrees F (36.39 degrees C) oral Pulse rate:   69 / minute BP sitting:   166 / 83  (right arm)  Vitals Entered By: Stanton Kidney Ditzler RN (January 30, 2010 11:46 AM) Is Patient Diabetic? Yes Did you bring your meter with you today? No Pain Assessment Patient in pain? yes     Location: left ring finger and back Intensity: 7 Type: tingles Onset of pain  since being in hospital 12/2009 Nutritional Status BMI of > 30 = obese Nutritional Status Detail appetite good  Have you ever been in a relationship where you felt threatened, hurt or afraid?denies   Does patient need assistance? Functional Status Self care Ambulation Normal Comments CBG at home 70 - 144. Back pain getting worse and left hand ring finger feels like frostbite.   Primary Care Provider:  Blondell Reveal MD   History of Present Illness: 60 yo female with PMH outlined below presents to [facility] for regular follow up appointment. She has no concerns at the time. No recent sicknesses or hospitalizaitons. No episodes of chest pain, SOB, no fever, no chills or palpitations. No specific abdominal or urinary concerns. No recent changes in appetite, weight, sleep patterns, mood.    Depression History:      The patient denies a depressed mood most of the day and a diminished interest in her usual daily activities.  The patient denies significant weight loss, significant weight gain, insomnia, hypersomnia, psychomotor agitation, psychomotor retardation, fatigue (loss of energy), feelings of worthlessness (guilt), impaired concentration (indecisiveness), and recurrent thoughts of death or suicide.        The patient denies that she feels like life is not worth living, denies that she wishes that she were dead, and denies that she has thought  about ending her life.         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9) 23)  Chest Pain  (ICD-786.50) 24)  Preventive Health Care  (ICD-V70.0) 25)  Screening Mammogram Nec  (ICD-V76.12) 26)  Symptom, Cough  (ICD-786.2) 27)  Low Back  Pain Syndrome  (ICD-724.2) 28)  Dyspnea On Exertion  (ICD-786.09) 29)  Dizziness, Chronic  (ICD-780.4) 30)  Accidental Fall Nos  (ICD-E888.9) 31)  Family History Diabetes 1st Degree Relative  (ICD-V18.0) 32)  Allergic Rhinitis  (ICD-477.9) 33)  Wrist Injury  (ICD-959.3) 34)  Obesity  (ICD-278.00)  Medications Prior to Update: 1)  Metformin Hcl 1000 Mg Tabs (Metformin Hcl) .... Take 1 Tablet By Mouth Two Times A Day 2)  Lantus 100 Unit/ml Soln (Insulin Glargine) .... Inject 70 Units Once Daily. 3)  Bd Insulin Syringe Microfine 28g X 1/2" 0.5 Ml  Misc (Insulin  Syringe-Needle U-100) .... As Directed 4)  Freestyle Test  Strp (Glucose Blood) .... Use To Check Blood Sugar Twice Daily 5)  Lancets 30g  Misc (Lancets) .... Use To Check Blood Sugar Twice A Day 6)  Toprol Xl 50 Mg Xr24h-Tab (Metoprolol Succinate) .... Take 1 Tablet By Mouth Two Times A Day 7)  Hydrochlorothiazide 25 Mg Tabs (Hydrochlorothiazide) .... Take 1 Tablet By Mouth Once A Day 8)  Kay Ciel 20 Meq Pack (Potassium Chloride) .... Take 1 Tablet By Mouth Once A Day 9)  Lipitor 40 Mg Tabs (Atorvastatin Calcium) .... Take 1 Tablet By Mouth Once A Day 10)  Aspirin 81 Mg Tbec (Aspirin) .... Take 1 Tablet By Mouth Once A Day 11)  Avapro 300 Mg  Tabs (Irbesartan) .... Take 1 Tablet By Mouth Once A Day 12)  Neurontin 300 Mg Caps (Gabapentin) .... Take 1 Tablet Day 1, Then 1 Tablet Two Times A Day At Day 2, Then Start To Take 1 Tablet By Mouth Three Times A Day After Day 3. 13)  Tussionex Pennkinetic Er 8-10 Mg/49ml Lqcr (Chlorpheniramine-Hydrocodone) .... 5 Ml Twice Daily As Needed For Cough. 14)  Clonidine Hcl 0.1 Mg Tabs (Clonidine Hcl) .... Take 1 Tablet By Mouth Two Times A Day 15)  Centrum  Tabs (Multiple Vitamins-Minerals) .... Take 1 Tablet By Mouth Once A Day  Current Medications (verified): 1)  Metformin Hcl 1000 Mg Tabs (Metformin Hcl) .... Take 1 Tablet By Mouth Two Times A Day 2)  Lantus 100 Unit/ml Soln (Insulin Glargine) .... Inject 70 Units Once Daily. 3)  Bd Insulin Syringe Microfine 28g X 1/2" 0.5 Ml  Misc (Insulin Syringe-Needle U-100) .... As Directed 4)  Freestyle Test  Strp (Glucose Blood) .... Use To Check Blood Sugar Twice Daily 5)  Lancets 30g  Misc (Lancets) .... Use To Check Blood Sugar Twice A Day 6)  Toprol Xl 50 Mg Xr24h-Tab (Metoprolol Succinate) .... Take 1 Tablet By Mouth Two Times A Day 7)  Hydrochlorothiazide 25 Mg Tabs (Hydrochlorothiazide) .... Take 1 Tablet By Mouth Once A Day 8)  Kay Ciel 20 Meq Pack (Potassium Chloride) .... Take 1 Tablet By Mouth Once A  Day 9)  Lipitor 40 Mg Tabs (Atorvastatin Calcium) .... Take 1 Tablet By Mouth Once A Day 10)  Aspirin 81 Mg Tbec (Aspirin) .... Take 1 Tablet By Mouth Once A Day 11)  Avapro 300 Mg  Tabs (Irbesartan) .... Take 1 Tablet By Mouth Once A Day 12)  Neurontin 300 Mg Caps (Gabapentin) .... Take 1 Tablet Day 1, Then 1 Tablet Two Times A Day At Day 2, Then Start To Take 1 Tablet By Mouth Three Times A Day After Day 3. 13)  Clonidine Hcl 0.1 Mg Tabs (Clonidine Hcl) .... Take 1 Tablet By Mouth Two Times A Day 14)  Centrum  Tabs (Multiple Vitamins-Minerals) .... Take 1 Tablet By Mouth Once A Day  Allergies (verified): 1)  ! Benadryl 2)  ! * Flu Vaccination  Past History:  Past Medical History: Last updated: 12/18/2009 Breast cancer, hx of, left 2006 (invasive papillary CA, ductal CA in situ) Cardiac cath 11/07 showed no ischemia, no significant disease. EF 55% by ECHO 11/07 Diabetes mellitus, type II Hypertension GERD Hyperlipidemia Obesity H/o colon polyp seen on Colonoscopy by Dr. Madilyn Fireman 1995(per pt) Repeat Colonoscopy by Dr. WJXB(14/78)                                   Isolated diverticulum in mid transverse colon.                                    Small non bleeding internal hemorrhoids                                   Repeat Colonoscopy every 5 years. Moderate OSA on sleep study(02/16/07) HTN crisis admitted to [facility] 3/11 (see record)  Past Surgical History: Last updated: 04/18/2008 S/P surgery and chemotherapy for lt breast cancer. colon polypectomy -2004. hysterectomy 1985.  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 Family History Diabetes 1st degree relative: father passed of its complications at 47 Family History Lung cancer: mother died at 77 Brother died of a PE Sister died of breast CA  Social History: Reviewed history from 01/07/2009 and no changes required. 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 Lungs are clear to auscultation, no crackles or wheezes. Heart:  Normal rate and regular rhythm. S1 and S2 normal without gallop, murmur, click, rub or other extra sounds.   Detailed Back/Spine Exam  Lumbosacral Exam:  Inspection-deformity:    Normal Palpation-spinal tenderness:     paraspinal tenderness bilatrel Range of Motion:    Forward Flexion:   10 degrees    Hyperextension:   10 degrees    Right Lateral Bend:   10 degrees    Left Lateral Bend:   10 degrees Squatting:  abnormal    can not perfor due to weight issue Lying Straight Leg Raise:    Right:  negative    Left:  negative Sitting Straight Leg Raise:    Right:  negative    Left:  negative Reverse Straight Leg Raise:    Right:  negative    Left:  negative Contralateral Straight Leg Raise:    Right:  negative    Left:  negative Sciatic Notch:    There is no sciatic notch tenderness. Toe Walking:    Right:  normal  Left:  normal Heel Walking:    Right:  normal    Left:  normal   Impression & Recommendations:  Problem # 1:  DIABETES MELLITUS, TYPE II (ICD-250.00) Will  check A1C today and will readjust the regimen as indicated.  Her updated medication list for this problem includes:    Metformin Hcl 1000 Mg Tabs (Metformin hcl) .Marland Kitchen... Take 1 tablet by mouth two times a day    Lantus 100 Unit/ml Soln (Insulin glargine) ..... Inject 70 units once daily.   Orders: T-Hgb A1C (in-house) (16109UE) T- Capillary Blood Glucose (45[address])  Problem # 2:  HYPERTENSION (ICD-401.9) Above the goal and I suspect this is secondary to her pain wnd she tells me that her BP is usually high when she is in pain. Will give perscription to refill  ibuprofen. Pt checks her Bp regularly and I have encouraged her to cont the same. Will see her in 2 weeks and will reassess at that time and readjust the regimen as indicated.  Her updated medication list for this problem includes:    Toprol Xl 50 Mg Xr24h-tab (Metoprolol succinate) .Marland Kitchen... Take 1 tablet by mouth two times a day    Hydrochlorothiazide 25 Mg Tabs (Hydrochlorothiazide) .Marland Kitchen... Take 1 tablet by mouth once a day    Avapro 300 Mg Tabs (Irbesartan) .Marland Kitchen... Take 1 tablet by mouth once a day    Clonidine Hcl 0.1 Mg Tabs (Clonidine hcl) .Marland Kitchen... Take 1 tablet by mouth two times a day Labs Reviewed: K+: 4.1 (12/04/2009) Creat: : 0.80 (12/04/2009)   Chol: 133 (07/22/2009)   HDL: 53 (07/22/2009)   LDL: 64 (07/22/2009)   TG: 78 (07/22/2009)  BP today: 166/83 Prior BP: 137/69 (12/18/2009)  Problem # 3:  HYPERLIPIDEMIA (ICD-272.4) Good control on current regimen, will continue the same and will check FLP on her next visit at which point the regimen can be readjusted if indicated.  Her updated medication list for this problem includes:    Lipitor 40 Mg Tabs (Atorvastatin calcium) .Marland Kitchen... 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  Her updated medication list for this problem includes:    Toprol Xl 50 Mg Xr24h-tab (Metoprolol succinate) .Marland Kitchen... Take 1 tablet by mouth two times a day    Hydrochlorothiazide 25 Mg Tabs (Hydrochlorothiazide) .Marland Kitchen... Take 1 tablet by mouth once a day    Aspirin 81 Mg Tbec (Aspirin) .Marland Kitchen... Take 1 tablet by mouth once a day    Avapro 300 Mg Tabs (Irbesartan) .Marland Kitchen... Take 1 tablet by mouth once a day  Problem # 5:  UNSPECIFIED HYPOTHYROIDISM (ICD-244.9) Recommend periodic assessment, no need to check today since pt asymptomatic.  Labs Reviewed: TSH: 3.944 (07/22/2009)    HgBA1c: 9.3 (10/28/2009) Chol: 133 (07/22/2009)   HDL: 53 (07/22/2009)   LDL: 64 (07/22/2009)   TG: 78 (07/22/2009)  Problem # 6:  LOW BACK PAIN SYNDROME (ICD-724.2) Will give her Ibuprofen and will reassess on next visit. I have made a referral to PT/OT. Her updated medication list for this problem includes:    Aspirin 81 Mg Tbec (Aspirin) .Marland Kitchen... Take 1 tablet by mouth once a day    Ibuprofen 800 Mg Tabs (Ibuprofen) .Marland Kitchen... Take 1 tablet  every 6 hours as needed for pain  Orders: Occupational Therapy (OT) Physical Therapy Referral (PT)  Complete Medication List: 1)  Metformin Hcl 1000 Mg Tabs (Metformin hcl) .... Take 1 tablet by mouth two times a day 2)  Lantus 100 Unit/ml Soln (Insulin glargine) .... Inject 70 units once daily. 3)  Bd Insulin Syringe Microfine 28g X 1/2" 0.5 Ml Misc (Insulin syringe-needle u-100) .... As directed 4)  Freestyle Test Strp (Glucose blood) .... Use to check blood sugar twice daily 5)  Lancets 30g Misc (Lancets) .... Use to check blood sugar twice a day 6)  Toprol Xl 50 Mg Xr24h-tab (Metoprolol succinate) .... Take 1 tablet by mouth two times a day 7)  Hydrochlorothiazide 25 Mg Tabs (Hydrochlorothiazide) .... Take 1 tablet by mouth once a day 8)   Kay Ciel 20 Meq Pack (Potassium chloride) .... Take 1 tablet by mouth once a day 9)  Lipitor 40 Mg Tabs (Atorvastatin calcium) .... Take 1 tablet by mouth once a day 10)  Aspirin 81 Mg Tbec (Aspirin) .... Take 1 tablet by mouth once a day 11)  Avapro 300 Mg Tabs (Irbesartan) .... Take 1 tablet by mouth once a day 12)  Neurontin 300 Mg Caps (Gabapentin) .... Take 1 tablet day 1, then 1 tablet two times a day at day 2, then start to take 1 tablet by mouth three times a day after day 3. 13)  Clonidine Hcl 0.1 Mg Tabs (Clonidine hcl) .... Take 1 tablet by mouth two times a day 14)  Centrum Tabs (Multiple vitamins-minerals) .... Take 1 tablet by mouth once a day 15)  Ibuprofen 800 Mg Tabs (Ibuprofen) .... Take 1 tablet every 6 hours as needed for pain  Patient Instructions: 1)  Please schedule a follow-up appointment in 3 months. 2)  Please check your blood pressure regularly, if it is >170 please call clinic at [phone] 3)  Please check your sugar levels regularly and remember to bring the meter with you to the next clinic appointment, if the sugars are > 350 or < 60 please call us at [phone] 4)  It is important that you exercise regularly at least 20 minutes 5 times a week. If you develop chest pain, have severe difficulty breathing, or feel very tired , stop exercising immediately and seek medical attention. Prescriptions: IBUPROFEN 800 MG TABS (IBUPROFEN) take 1 tablet every 6 hours as needed for pain  #60 x 0   Entered and Authorized by:   Mliss Sax MD   Signed by:   Mliss Sax MD on 01/30/2010   Method used:   Electronically to        [facility] [phone]* (retail)       [address]       [place], [place]  [address]       Ph: [phone]       Fax: [phone]   RxID:   [id number] TOPROL XL 50 MG XR24H-TAB (METOPROLOL SUCCINATE) Take 1 tablet by mouth two times a day  #60 x 6   Entered and Authorized by:   Mliss Sax MD   Signed by:   Mliss Sax MD on 01/30/2010   Method used:   Electronically to        [facility] [phone]* (retail)       [address]       [place], [place]  [address]  Ph: [phone]       Fax: [phone]   RxID:   [id number] LANCETS 30G  MISC (LANCETS) use to check blood sugar twice a day  #1 box x 11   Entered and Authorized by:   Mliss Sax MD   Signed by:   Mliss Sax MD on 01/30/2010   Method used:   Electronically to        [facility] [phone]* (retail)       [address]       [place], [place]  [address]       Ph: [phone]       Fax: [phone]   RxID:   [id number] FREESTYLE TEST  STRP (GLUCOSE BLOOD) use to check blood sugar twice daily  #4 x 5   Entered and Authorized by:   Mliss Sax MD   Signed by:   Mliss Sax MD on 01/30/2010   Method used:   Electronically to        [facility] [phone]* (retail)       [address]       [place], [place]  [address]       Ph: [phone]       Fax: [phone]   RxID:   [id number] LANTUS 100 UNIT/ML SOLN (INSULIN GLARGINE) Inject 70 units once daily.  #1 x 5   Entered and Authorized by:   Mliss Sax MD   Signed by:   Mliss Sax MD on 01/30/2010   Method used:   Electronically to        [facility] [phone]* (retail)       [address]       [place], [place]  [address]       Ph: [phone]       Fax: [phone]   RxID:   [id number] CENTRUM  TABS (MULTIPLE VITAMINS-MINERALS) Take 1 tablet by mouth once a day  #30 x 3   Entered and Authorized by:   Mliss Sax MD   Signed by:   Mliss Sax MD on 01/30/2010   Method used:   Electronically to        [facility] [phone]* (retail)       [address]       [place], [place]  [address]       Ph: [phone]       Fax: [phone]   RxID:   [id number] CLONIDINE HCL 0.1 MG TABS (CLONIDINE HCL) Take 1  tablet by mouth two times a day  #62 x 1   Entered and Authorized by:   Mliss Sax MD   Signed by:   Mliss Sax MD on 01/30/2010   Method used:   Electronically to        [facility] [phone]* (retail)       [address]       [place], [place]  [address]       Ph: [phone]       Fax: [phone]   RxID:   [id number] NEURONTIN 300 MG CAPS (GABAPENTIN) Take 1 tablet day 1, then 1 tablet two times a day at day 2, then start to Take 1 tablet by mouth three times a day after day 3.  #  100 x 2   Entered and Authorized by:   Mliss Sax MD   Signed by:   Mliss Sax MD on 01/30/2010   Method used:   Electronically to        [facility] [phone]* (retail)       [address]       [place], [place]  [address]       Ph: [phone]       Fax: [phone]   RxID:   [id number] AVAPRO 300 MG  TABS (IRBESARTAN) Take 1 tablet by mouth once a day  #93 x 4   Entered and Authorized by:   Mliss Sax MD   Signed by:   Mliss Sax MD on 01/30/2010   Method used:   Electronically to        [facility] [phone]* (retail)       [address]       [place], [place]  [address]       Ph: [phone]       Fax: [phone]   RxID:   [id number] ASPIRIN 81 MG TBEC (ASPIRIN) Take 1 tablet by mouth once a day  #93 x 3   Entered and Authorized by:   Mliss Sax MD   Signed by:   Mliss Sax MD on 01/30/2010   Method used:   Electronically to        [facility] [phone]* (retail)       [address]       [place], [place]  [address]       Ph: [phone]       Fax: [phone]   RxID:   [id number] LIPITOR 40 MG TABS (ATORVASTATIN CALCIUM) Take 1 tablet by mouth once a day  #93 x 4   Entered and Authorized by:   Mliss Sax MD   Signed by:   Mliss Sax MD on 01/30/2010   Method used:   Electronically to        [facility] [phone]* (retail)       [address]       [place], [place]  [address]       Ph: [phone]       Fax: [phone]   RxID:   [id number] KAY CIEL 20 MEQ PACK (POTASSIUM CHLORIDE) Take 1 tablet by mouth once a day  #93 x 3   Entered and Authorized by:   Mliss Sax MD   Signed by:   Mliss Sax MD on 01/30/2010   Method used:   Electronically to        [facility] [phone]* (retail)       [address]       [place], [place]  [address]       Ph: [phone]       Fax: [phone]   RxID:   [id number] HYDROCHLOROTHIAZIDE 25 MG TABS (HYDROCHLOROTHIAZIDE) Take 1 tablet by mouth once a day  #93 x 4   Entered and Authorized by:   Mliss Sax MD   Signed by:   Mliss Sax MD on 01/30/2010   Method used:   Electronically to        [facility] [phone]* (retail)  [address]       [place], [place]  [address]       Ph: [phone]       Fax: [phone]   RxID:   [id number] BD INSULIN SYRINGE MICROFINE 28G X 1/2" 0.5 ML  MISC (INSULIN SYRINGE-NEEDLE U-100) as directed  #200 x 6   Entered and Authorized by:   Mliss Sax MD   Signed by:   Mliss Sax MD on 01/30/2010   Method used:   Electronically to        [facility] [phone]* (retail)       [address]       [place], [place]  [address]       Ph: [phone]       Fax: [phone]   RxID:   [id number] METFORMIN HCL 1000 MG TABS (METFORMIN HCL) take 1 tablet by mouth two times a day  #200 x 2   Entered and Authorized by:   Mliss Sax MD   Signed by:   Mliss Sax MD on 01/30/2010   Method used:   Electronically to        [facility] [phone]* (retail)       [address]       [place], [place]  [address]       Ph: [phone]       Fax: [phone]   RxID:   [id number]   Prevention & Chronic Care Immunizations   Influenza vaccine: Not documented   Influenza vaccine deferral: Not  indicated  (01/30/2010)    Tetanus booster: 04/10/2009: Td    Pneumococcal vaccine: Not documented   Pneumococcal vaccine deferral: Refused  (03/25/2009)  Colorectal Screening   Hemoccult: Not documented   Hemoccult action/deferral: Not indicated  (01/30/2010)    Colonoscopy: Not documented   Colonoscopy action/deferral: Not indicated  (01/30/2010)   Colonoscopy due: 02/11/2010  Other Screening   Pap smear: Not documented   Pap smear action/deferral: Not indicated S/P hysterectomy  (03/25/2009)    Mammogram: BI-RADS CATEGORY 2:  Benign finding(s).^MM DIGITAL DIAGNOSTIC BILAT  (04/01/2009)   Mammogram due: 01/2009   Smoking status: never  (01/30/2010)  Diabetes Mellitus   HgbA1C: 9.3  (10/28/2009)   Hemoglobin A1C due: 10/13/2009    Eye exam: No diabetic retinopathy. OU   Visual acuity OD : 20/50     Visual acuity OS :  20/25    Intraocular pressure OD:  18    Intraocular pressure OS:   18     (02/26/2009)   Eye exam due: 02/26/2010    Foot exam: yes  (04/10/2009)   High risk foot: No  (11/01/2008)   Foot care education: Not documented   Foot exam due: 03/25/2010    Urine microalbumin/creatinine ratio: 21.6  (04/10/2009)   Urine microalbumin action/deferral: Refused   Urine microalbumin/cr due: 11/01/2009    Diabetes flowsheet reviewed?: Yes   Progress toward A1C goal: Unchanged  Lipids   Total Cholesterol: 133  (07/22/2009)   LDL: 64  (07/22/2009)   LDL Direct: Not documented   HDL: 53  (07/22/2009)   Triglycerides: 78  (07/22/2009)   Lipid panel due: 03/25/2010    SGOT (AST): 18  (07/22/2009)   SGPT (ALT): 17  (07/22/2009)   Alkaline phosphatase: 69  (07/22/2009)   Total bilirubin: 0.3  (07/22/2009)    Lipid flowsheet  reviewed?: Yes   Progress toward LDL goal: At goal  Hypertension   Last Blood Pressure: 166 / 83  (01/30/2010)   Serum creatinine: 0.80  (12/04/2009)   BMP action: Ordered   Serum potassium 4.1  (12/04/2009)    Hypertension  flowsheet reviewed?: Yes   Progress toward BP goal: Deteriorated  Self-Management Support :   Personal Goals (by the next clinic visit) :     Personal A1C goal: 7  (07/21/2009)     Personal blood pressure goal: 140/90  (07/21/2009)     Personal LDL goal: 70  (07/21/2009)    Patient will work on the following items until the next clinic visit to reach self-care goals:     Medications and monitoring: take my medicines every day, check my blood sugar, check my blood pressure, bring all of my medications to every visit, weigh myself weekly, examine my feet every day  (01/30/2010)     Eating: drink diet soda or water instead of juice or soda, eat more vegetables, use fresh or frozen vegetables, eat foods that are low in salt, eat baked foods instead of fried foods, eat fruit for snacks and desserts, limit or avoid alcohol  (01/30/2010)     Activity: take a 30 minute walk every day  (01/30/2010)    Diabetes self-management support: Written self-care plan, Education handout  (01/30/2010)   Diabetes care plan printed   Diabetes education handout printed   Last diabetes self-management training by diabetes educator: 12/18/2009   Last medical nutrition therapy: 12/24/2008    Hypertension self-management support: Written self-care plan, Education handout  (01/30/2010)   Hypertension self-care plan printed.   Hypertension education handout printed    Lipid self-management support: Written self-care plan, Education handout  (01/30/2010)   Lipid self-care plan printed.   Lipid education handout printed   Appended Document: Lab Order    Lab Visit  Laboratory Results   Blood Tests   Date/Time Received: January 30, 2010 12:31 PM Date/Time Reported: Alric Quan  January 30, 2010 12:31 PM   HGBA1C: 7.4%   (Normal Range: Non-Diabetic - 3-6%   Control Diabetic - 6-8%) CBG Random:: 96mg /dL    Orders Today:

## 2010-11-05 NOTE — Progress Notes (Signed)
Summary: mammogram/ hla  Phone Note Call from Patient   Summary of Call: pt called to ask if her mammogram findings were on the chart and that she would need to repeat in 6 mon, confirmed and encouraged her to speak to md at next appt, she is agreeable Initial call taken by: Marin Roberts RN,  August 20, 2010 2:04 PM

## 2010-11-05 NOTE — Progress Notes (Signed)
  Phone Note Outgoing Call   Call placed by: Theotis Barrio NT II,  October 08, 2009 2:07 PM Call placed to: Patient Details for Reason: NEEDING INSURANCE INFO FOR GI REFERRAL Summary of Call: CALLED THE PATIENT AND LEFT A VOICE MESSAGE FOR THE PATIENT TO CALL ME BACK WITH THE INSURANCE INFO/ NEEDING TO MAKE GI REFERRAL.Jachin Coury NT II  October 08, 2009 2:08 PM

## 2010-11-05 NOTE — Letter (Signed)
Summary: Generic Letter  [facility]  [address]   [place], [place] [address]   Phone: [phone]  Fax: [phone]    12/12/2009  RE: [name] DOB: 1951/01/24  TO WHOM IT MAY CONCERN  I was asked to give the estimated number of flare up on her medical condition. Based on the hospital records she has been in the hospital for her problems on the following dates in the past few months: 12/04/2009: Clinic visit 10/28/2009: ER visit 09/12/2009: Clinic visit 09/08/2009: ER visit 07/22/2009: Clinic visit 07/21/2009: Clinic visit 04/29/2009: Clinic visit 04/10/2009: Clinic visit 04/01/2009: Clinic visit 04/24/2009: Clinic visit  Please feel free to contact this clinic if further information is desired.     Sincerely,   Zara Council MD

## 2010-11-05 NOTE — Progress Notes (Signed)
Summary: Refill/gh  Phone Note Refill Request Message from:  Fax from Pharmacy on November 03, 2009 12:33 PM  Refills Requested: Medication #1:  LANTUS 100 UNIT/ML SOLN Inject 28 units subcutaneously once daily at bedtime.   Last Refilled: 09/16/2009  Method Requested: Fax to Local Pharmacy Initial call taken by: Angelina Ok RN,  November 03, 2009 12:34 PM  Follow-up for Phone Call       Follow-up by: Blondell Reveal MD,  November 12, 2009 1:52 PM    Prescriptions: LANTUS 100 UNIT/ML SOLN (INSULIN GLARGINE) Inject 28 units subcutaneously once daily at bedtime.  #2 x 6   Entered and Authorized by:   Blondell Reveal MD   Signed by:   Blondell Reveal MD on 11/12/2009   Method used:   Electronically to        [facility] [phone]* (retail)       [address]       [place], [place]  [address]       Ph: [phone]       Fax: [phone]   RxID:   [id number]

## 2010-11-05 NOTE — Progress Notes (Signed)
  Phone Note Outgoing Call   Call placed by: Theotis Barrio NT II,  November 05, 2009 2:37 PM Call placed to: Patient Details for Reason: GI REFERRAL Summary of Call: CALLED PATIENT LEFT VOICE MESSAGE FOR PATIENT TO CALL [facility] , NEED FOR PATIENT TO LET ME KNOW IF SHE NOW HAS INSURANCE OR NOT / SPOKE WITH Madeline HILL, PATIENT STILL HAS NOT COME IN TO UP DATE HER CARD.  CARD EXPIRED 12/29/010. UNABLE TO REFERR THIS PATIENT UNTIL THIS IS TAKEN CARE OF. Deny Chevez NT II  November 05, 2009 2:42 PM

## 2010-11-05 NOTE — Progress Notes (Signed)
Summary: phone/gg  Phone Note Call from Patient   Summary of Call: Pt walked into clinic today and wants to be seen for back pain.  She wants short term disability.  Please see last note per dr Comer Locket. Will see today at 1330 for the backpain. Initial call taken by: Merrie Roof RN,  July 29, 2010 11:13 AM

## 2010-11-05 NOTE — Progress Notes (Signed)
  Phone Note Call from Patient   Details for Reason: FMLA FORM Summary of Call: Patient came in on yesterday with her FMLA forms from her job.  The patient stated that she has to have her FMLA forms updated every 6 months.  She had her job to [employer] them to her home and they have given her until 05/13/2010 to have the forms completed.  The last time she had them completed by Dr. Polly Cobia back in Feb.  She states that you are aware of her condition and hope that you can get this completed as soon as possible.  Patient also states she will be back on Tuesday to pick them up.  Told patient that we will get the papers placed in your box.  Patient was also recently in the office and was seen by Dr. Eben Burow and he was aware that she would be bringin the forms for you. Initial call taken by: Shon Hough,  May 07, 2010 8:42 AM  Follow-up for Phone Call        FMLA papers finished/signed and placed in [facility] (Behind Karen's desk)  in Internal Medicine department in first floor. Please scan then to EMR before handing them to the patient. Follow-up by: Blondell Reveal MD,  May 14, 2010 2:59 AM

## 2010-11-05 NOTE — Letter (Signed)
Summary: New Patient letter  [facility]  [address] [place], [place] [address]   Phone: [phone]  Fax: [phone]       12/12/2009 MRN: [id number]  [name] [address] [place], [place]  [address]  Dear Ms. Kazanjian,  Welcome to the Gastroenterology Division at [facility].    You are scheduled to see Dr. Jarold Motto on 01/13/2010 at 9:00AM on the 3rd floor at [facility], [address]  We ask that you try to arrive at our office 15 minutes prior to your appointment time to allow for check-in.  We would like you to complete the enclosed self-administered evaluation form prior to your visit and bring it with you on the day of your appointment.  We will review it with you.  Also, please bring a complete list of all your medications or, if you prefer, bring the medication bottles and we will list them.  Please bring your insurance card so that we may make a copy of it.  If your insurance requires a referral to see a specialist, please bring your referral form from your primary care physician.  Co-payments are due at the time of your visit and may be paid by cash, check or credit card.     Your office visit will consist of a consult with your physician (includes a physical exam), any laboratory testing he/she may order, scheduling of any necessary diagnostic testing (e.g. x-ray, ultrasound, CT-scan), and scheduling of a procedure (e.g. Endoscopy, Colonoscopy) if required.  Please allow enough time on your schedule to allow for any/all of these possibilities.    If you cannot keep your appointment, please call [phone] to cancel or reschedule prior to your appointment date.  This allows [place] the opportunity to schedule an appointment for another patient in need of care.  If you do not cancel or reschedule by 5 p.m. the business day prior to your appointment date, you will be charged a $50.00 late cancellation/no-show fee.    Thank you for  choosing  Gastroenterology for your medical needs.  We appreciate the opportunity to care for you.  Please visit [place] at our website  to learn more about our practice.                     Sincerely,                                                             The Gastroenterology Division

## 2010-11-05 NOTE — Assessment & Plan Note (Signed)
Summary: HFU/gg   Vital Signs:  Patient profile:   60 year old female Height:      61 inches (154.94 cm) Weight:      217.1 pounds (98.68 kg) BMI:     41.17 Temp:     98.6 degrees F oral Pulse rate:   81 / minute BP sitting:   121 / 69  (right arm)  Vitals Entered By: Chinita Pester RN (December 12, 2009 2:59 PM) CC: Hospital f/u at [facility]  for HTN. Is Patient Diabetic? Yes Did you bring your meter with you today? Yes Pain Assessment Patient in pain? yes     Location: lower back Intensity: 5 Type: aching Onset of pain  Chronic Nutritional Status BMI of > 30 = obese CBG Result 172  Have you ever been in a relationship where you felt threatened, hurt or afraid?No   Does patient need assistance? Functional Status Self care Ambulation Normal   Primary Care Provider:  Blondell Reveal MD  CC:  Hospital f/u at [facility]  for HTN.Marland Kitchen  History of Present Illness: 60 yo women wih PMH as described in EMR is here today for a hospial follow up for her admission 2 days ago in [facility]. She was admitted for high blod pressure and light headedness. The BP on admission was high as 192/100. MRI was done which was not s/o any stroke ans she was discharged after 24 hour stabilization. She says that her BP is always labile despite being on 4 BP medicines. Her BP today is 121/69 and last few visits her BP has been anywhere between 150's to 180's systolic. She also complains that her weight has been labile, it was 210 last night and 220 this morning. Looking back at emr her weight has been consistent around 215 pounds in atleast last 2 years. She also complains about her Diabetes being impossibe to control despite doing everything said and prescribed by the doctor. She is asking for FMLA form to be filled. Patient's daughter is with her today.  Depression History:      The patient denies a depressed mood most of the day and a diminished interest in her usual daily  activities.         Preventive Screening-Counseling & Management  Alcohol-Tobacco     Alcohol drinks/day: <1     Smoking Status: never     Passive Smoke Exposure: yes  Caffeine-Diet-Exercise     Does Patient Exercise: yes     Type of exercise: walk     Times/week: 5  Problems Prior to Update: 1)  Pain in Joint, Lower Leg  (ICD-719.46) 2)  Diabetic Peripheral Neuropathy  (ICD-250.60) 3)  Breast Cancer, Hx of  (ICD-V10.3) 4)  Follow-up, Chemotherapy  (ICD-V67.2) 5)  Enlargement of Lymph Nodes  (ICD-785.6) 6)  Blood in Stool, Occult  (ICD-792.1) 7)  Hypertension  (ICD-401.9) 8)  Diabetes Mellitus, Type II  (ICD-250.00) 9)  Hyperlipidemia  (ICD-272.4) 10)  Coronary Artery Disease  (ICD-414.00) 11)  Morbid Obesity  (ICD-278.01) 12)  Unspecified Hypothyroidism  (ICD-244.9) 13)  Low Back Pain Syndrome  (ICD-724.2) 14)  Heel Pain, Right  (ICD-729.5) 15)  Sleep Apnea, Obstructive, Moderate  (ICD-327.23) 16)  Hip Pain, Right  (ICD-719.45) 17)  Shoulder Pain, Left  (ICD-719.41) 18)  Uri  (ICD-465.9) 19)  Dysuria  (ICD-788.1) 20)  Unspecified Sinusitis  (ICD-473.9) 21)  Chest Pain  (ICD-786.50) 22)  Preventive Health Care  (ICD-V70.0) 23)  Screening Mammogram Nec  (  ICD-V76.12) 24)  Symptom, Cough  (ICD-786.2) 25)  Failure, 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 Take 1 Tablet By Mouth Once A Day 2)  Aspirin 81 Mg Tbec (Aspirin) .... Take 1 Tablet By Mouth Once A Day 3)  Zyrtec 10 Mg Tabs (Cetirizine Hcl) .... Take 1 Tablet By Mouth Once A Day As Needed 4)  Lipitor 40  Mg Tabs (Atorvastatin Calcium) .... Take 1 Tablet By Mouth Once A Day 5)  Centrum  Tabs (Multiple Vitamins-Minerals) .... Take 1 Tablet By Mouth Once A Day 6)  Kay Ciel 20 Meq Pack (Potassium Chloride) .... Take 1 Tablet By Mouth Once A Day 7)  Metformin Hcl 1000 Mg Tabs (Metformin Hcl) .... Take 1 Tablet By Mouth Two Times A Day 8)  Hydrochlorothiazide 25 Mg Tabs (Hydrochlorothiazide) .... Take 1 Tablet By Mouth Once A Day 9)  Toprol Xl 100 Mg  Tb24 (Metoprolol Succinate) .... Take 1 Tablet By Mouth Once A Day 10)  Promethazine Hcl 12.5 Mg  Tabs (Promethazine Hcl) .... Take 1 Tablet By Mouth Every 8 Hours As Needed For Nausea 11)  Bd Insulin Syringe Microfine 28g X 1/2" 0.5 Ml  Misc (Insulin Syringe-Needle U-100) .... As Directed 12)  Alprazolam 0.5 Mg  Tabs (Alprazolam) .... Take 1 Tab By Mouth At Bedtime 13)  Norvasc 10 Mg Tabs (Amlodipine Besylate) .... Take 1 Tablet By Mouth Once A Day 14)  Tylenol Extra Strength 500 Mg  Tabs (Acetaminophen) .... Take 2 Tablet Three Times Daily As Needed For Pain. 15)  Lantus 100 Unit/ml Soln (Insulin Glargine) .... Inject 36 Units Subcutaneously Once Daily At Bedtime. 16)  Neurontin 300 Mg Caps (Gabapentin) .... Take 1 Tablet Day 1, Then 1 Tablet Two Times A Day At Day 2, Then Start To Take 1 Tablet By Mouth Three Times A Day After Day 3. 17)  Nasonex 50 Mcg/act Susp (Mometasone Furoate) .... 2 Sprays Per Nostril Daily 18)  Tussionex Pennkinetic Er 8-10 Mg/83ml Lqcr (Chlorpheniramine-Hydrocodone) .... 5 Ml Twice Daily As Needed For Cough.  Current Medications (verified): 1)  Avapro 300 Mg  Tabs (Irbesartan) .... Take 1 Tablet By Mouth Once A Day 2)  Aspirin 81 Mg Tbec (Aspirin) .... Take 1 Tablet By Mouth Once A Day 3)  Zyrtec 10 Mg Tabs (Cetirizine Hcl) .... Take 1 Tablet By Mouth Once A Day As Needed 4)  Lipitor 40 Mg Tabs (Atorvastatin Calcium) .... Take 1 Tablet By Mouth Once A Day 5)  Centrum  Tabs (Multiple Vitamins-Minerals) .... Take 1 Tablet By  Mouth Once A Day 6)  Kay Ciel 20 Meq Pack (Potassium Chloride) .... Take 1 Tablet By Mouth Once A Day 7)  Metformin Hcl 1000 Mg Tabs (Metformin Hcl) .... Take 1 Tablet By Mouth Two Times A Day 8)  Hydrochlorothiazide 25 Mg Tabs (Hydrochlorothiazide) .... Take 1 Tablet By Mouth Once A Day 9)  Toprol Xl 100 Mg  Tb24 (Metoprolol Succinate) .... Take 1 Tablet By Mouth Once A Day 10)  Promethazine Hcl 12.5 Mg  Tabs (Promethazine Hcl) .... Take 1 Tablet By Mouth Every  8 Hours As Needed For Nausea 11)  Bd Insulin Syringe Microfine 28g X 1/2" 0.5 Ml  Misc (Insulin Syringe-Needle U-100) .... As Directed 12)  Alprazolam 0.5 Mg  Tabs (Alprazolam) .... Take 1 Tab By Mouth At Bedtime 13)  Tylenol Extra Strength 500 Mg  Tabs (Acetaminophen) .... Take 2 Tablet Three Times Daily As Needed For Pain. 14)  Lantus 100 Unit/ml Soln (Insulin Glargine) .... Inject 36 Units Subcutaneously Once Daily At Bedtime. 15)  Neurontin 300 Mg Caps (Gabapentin) .... Take 1 Tablet Day 1, Then 1 Tablet Two Times A Day At Day 2, Then Start To Take 1 Tablet By Mouth Three Times A Day After Day 3. 16)  Nasonex 50 Mcg/act Susp (Mometasone Furoate) .... 2 Sprays Per Nostril Daily 17)  Tussionex Pennkinetic Er 8-10 Mg/17ml Lqcr (Chlorpheniramine-Hydrocodone) .... 5 Ml Twice Daily As Needed For Cough. 18)  Clonidine Hcl 0.1 Mg Tabs (Clonidine Hcl) .... Take 1 Tablet By Mouth Two Times A Day  Allergies (verified): 1)  ! Benadryl 2)  ! * Flu Vaccination  Past History:  Past Medical History: Last updated: 09/14/2007 Breast cancer, hx of, left 2006 (invasive papillary CA, ductal CA in situ) Cardiac cath 11/07 showed no ischemia, no significant disease. EF 55% by ECHO 11/07 Diabetes mellitus, type II Hypertension GERD Hyperlipidemia Obesity H/o colon polyp seen on Colonoscopy by Dr. Madilyn Fireman 1995(per pt) Repeat Colonoscopy by Dr. WJXB(14/78)                                   Isolated diverticulum in mid transverse colon.                                     Small non bleeding internal hemorrhoids                                   Repeat Colonoscopy every 5 years. Moderate OSA on sleep study(02/16/07)  Past Surgical History: Last updated: 04/18/2008 S/P surgery and chemotherapy for lt breast cancer. colon polypectomy -2004. hysterectomy 1985.  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 Never Smoked Alcohol use-no Drug use-no  Risk Factors: Alcohol Use: <1 (12/12/2009) Exercise: yes (12/12/2009)  Risk Factors: Smoking Status: never (12/12/2009) Passive Smoke Exposure: yes (12/12/2009)  Review of Systems      See HPI  Physical Exam  Additional Exam:  Gen: AOx3, in no acute distress, moridly obese, well groomed, daughter with her Eyes: PERRL, EOMI ENT:MMM, No erythema noted in posterior pharynx Neck: No JVD, No LAP Chest: CTAB with  good respiratory effort CVS: regular rhythmic rate, NO M/R/G, S1 S2 normal Abdo: soft,ND, BS+x4, Non tender and No hepatosplenomegaly EXT: No odema noted Neuro: Non focal, gait is normal Skin: no rashes noted.    Impression & Recommendations:  Problem # 1:  HYPERTENSION (ICD-401.9) Assessment Unchanged  Her BP s weel controlled today and she is insisting that the regimen that was started at [place] is better then the one she was on so I started her Clonidine  0.1 mg two times a day and stop norvasc. I will review her next week. K and Crt is WNL. The following medications were removed from the medication list:    Norvasc 10 Mg Tabs (Amlodipine besylate) .Marland Kitchen... Take 1 tablet by mouth once a day Her updated medication list for this problem includes:    Avapro 300 Mg Tabs (Irbesartan)  .Marland Kitchen... Take 1 tablet by mouth once a day    Hydrochlorothiazide 25 Mg Tabs (Hydrochlorothiazide) .Marland Kitchen... Take 1 tablet by mouth once a day    Toprol Xl 100 Mg Tb24 (Metoprolol succinate) .Marland Kitchen... Take 1 tablet by mouth once a day    Clonidine Hcl 0.1 Mg Tabs (Clonidine hcl) .Marland Kitchen... Take 1 tablet by mouth two times a day  BP today: 121/69 Prior BP: 185/92 (12/04/2009)  Labs Reviewed: K+: 4.1 (12/04/2009) Creat: : 0.80 (12/04/2009)   Chol: 133 (07/22/2009)   HDL: 53 (07/22/2009)   LDL: 64 (07/22/2009)   TG: 78 (07/22/2009)  Problem # 2:  DIABETES MELLITUS, TYPE II (ICD-250.00) Assessment: Unchanged Her most recent Hba1c is 9.6 from the hospital admission notes. We told her that the most important thing to focus at chronic problems like diabetes and HTN and taking 1 day at a time. We agreed upon seeing Jamison Neighbor for disbetes management. Her lantus is nat 36 units and we already have a plna to increse it acc to dr [employer] plan. Her updated medication list for this problem includes:    Avapro 300 Mg Tabs (Irbesartan) .Marland Kitchen... Take 1 tablet by mouth once a day    Aspirin 81 Mg Tbec (Aspirin) .Marland Kitchen... Take 1 tablet by mouth once a day    Metformin Hcl 1000 Mg Tabs (Metformin hcl) .Marland Kitchen... Take 1 tablet by mouth two times a day    Lantus 100 Unit/ml Soln (Insulin glargine) ..... Inject 36 units subcutaneously once daily at bedtime.  Orders: Capillary Blood Glucose/CBG (98119) Diabetic Clinic Referral (Diabetic)  Labs Reviewed: Creat: 0.80 (12/04/2009)     Last Eye Exam: No diabetic retinopathy. OU   Visual acuity OD : 20/50     Visual acuity OS :  20/25    Intraocular pressure OD:  18    Intraocular pressure OS:   18    (02/26/2009) Reviewed HgBA1c results: 9.3 (10/28/2009)  9.0 (07/21/2009)  Problem # 3:  HYPERLIPIDEMIA (ICD-272.4) Assessment: Unchanged Well controlled at this point. Her updated medication list for this problem includes:    Lipitor 40 Mg Tabs (Atorvastatin calcium) .Marland Kitchen... Take 1  tablet by mouth once a day  Labs Reviewed: SGOT: 18 (07/22/2009)   SGPT: 17 (07/22/2009)   HDL:53 (07/22/2009), 58 (11/01/2008)  LDL:64 (07/22/2009), 47 (11/01/2008)  Chol:133 (07/22/2009), 123 (11/01/2008)  Trig:78 (07/22/2009), 89 (11/01/2008)  Problem # 4:  MORBID OBESITY (ICD-278.01) She is clearly very anxious today and despitein her own words she has been trying to lose weight for last 2 years with water aerobics and everything else but nothing is working for her. i would discuss this some other time cause its really difficult to make her understand about any plan. She is convinced that she is very special and her weight fluctuates everyday from day to night. We will discuss this concern some other time. Ht: 61 (12/12/2009)   Wt: 217.1 (12/12/2009)   BMI: 41.17 (12/12/2009)  Problem # 5:  PREVENTIVE HEALTH CARE (ICD-V70.0) Uptodate at this time.  Complete Medication List: 1)  Avapro 300 Mg Tabs (Irbesartan) .... Take 1 tablet by mouth once a day  2)  Aspirin 81 Mg Tbec (Aspirin) .... Take 1 tablet by mouth once a day 3)  Zyrtec 10 Mg Tabs (Cetirizine hcl) .... Take 1 tablet by mouth once a day as needed 4)  Lipitor 40 Mg Tabs (Atorvastatin calcium) .... Take 1 tablet by mouth once a day 5)  Centrum Tabs (Multiple vitamins-minerals) .... Take 1 tablet by mouth once a day 6)  Kay Ciel 20 Meq Pack (Potassium chloride) .... Take 1 tablet by mouth once a day 7)  Metformin Hcl 1000 Mg Tabs (Metformin hcl) .... Take 1 tablet by mouth two times a day 8)  Hydrochlorothiazide 25 Mg Tabs (Hydrochlorothiazide) .... Take 1 tablet by mouth once a day 9)  Toprol Xl 100 Mg Tb24 (Metoprolol succinate) .... Take 1 tablet by mouth once a day 10)  Promethazine Hcl 12.5 Mg Tabs (Promethazine hcl) .... Take 1 tablet by mouth every 8 hours as needed for nausea 11)  Bd Insulin Syringe Microfine 28g X 1/2" 0.5 Ml Misc (Insulin syringe-needle u-100) .... As directed 12)  Alprazolam 0.5 Mg Tabs (Alprazolam) ....  Take 1 tab by mouth at bedtime 13)  Tylenol Extra Strength 500 Mg Tabs (Acetaminophen) .... Take 2 tablet three times daily as needed for pain. 14)  Lantus 100 Unit/ml Soln (Insulin glargine) .... Inject 36 units subcutaneously once daily at bedtime. 15)  Neurontin 300 Mg Caps (Gabapentin) .... Take 1 tablet day 1, then 1 tablet two times a day at day 2, then start to take 1 tablet by mouth three times a day after day 3. 16)  Nasonex 50 Mcg/act Susp (Mometasone furoate) .... 2 sprays per nostril daily 17)  Tussionex Pennkinetic Er 8-10 Mg/9ml Lqcr (Chlorpheniramine-hydrocodone) .... 5 ml twice daily as needed for cough. 18)  Clonidine Hcl 0.1 Mg Tabs (Clonidine hcl) .... Take 1 tablet by mouth two times a day  Patient Instructions: 1)  Please schedule a follow-up appointment in 1 weeks. 2)  It is important that you exercise regularly at least 20 minutes 5 times a week. If you develop chest pain, have severe difficulty breathing, or feel very tired , stop exercising immediately and seek medical attention. 3)  You need to lose weight. Consider a lower calorie diet and regular exercise.  4)  Check your blood sugars regularly.  5)  Check your blood pressure regularly and keep a log of your blood pressure. Prescriptions: CLONIDINE HCL 0.1 MG TABS (CLONIDINE HCL) Take 1 tablet by mouth two times a day  #62 x 1   Entered and Authorized by:   Lars Mage MD   Signed by:   Lars Mage MD on 12/12/2009   Method used:   Print then Give to Patient   RxID:   [id number]   Prevention & Chronic Care Immunizations   Influenza vaccine: Not documented   Influenza vaccine deferral: Deferred  (12/12/2009)    Tetanus booster: 04/10/2009: Td    Pneumococcal vaccine: Not documented   Pneumococcal vaccine deferral: Refused  (03/25/2009)  Colorectal Screening   Hemoccult: Not documented   Hemoccult action/deferral: Deferred  (12/12/2009)    Colonoscopy: Not documented   Colonoscopy action/deferral:  Deferred  (12/12/2009)  Other Screening   Pap smear: Not documented   Pap smear action/deferral: Not indicated S/P hysterectomy  (03/25/2009)    Mammogram: BI-RADS CATEGORY 2:  Benign finding(s).^MM DIGITAL DIAGNOSTIC BILAT  (04/01/2009)   Mammogram due: 01/2009   Smoking status: never  (12/12/2009)  Diabetes Mellitus   HgbA1C: 9.3  (10/28/2009)  Hemoglobin A1C due: 10/13/2009    Eye exam: No diabetic retinopathy. OU   Visual acuity OD : 20/50     Visual acuity OS :  20/25    Intraocular pressure OD:  18    Intraocular pressure OS:   18     (02/26/2009)   Eye exam due: 02/26/2010    Foot exam: yes  (04/10/2009)   High risk foot: No  (11/01/2008)   Foot care education: Not documented   Foot exam due: 03/25/2010    Urine microalbumin/creatinine ratio: 21.6  (04/10/2009)   Urine microalbumin/cr due: 11/01/2009    Diabetes flowsheet reviewed?: Yes   Progress toward A1C goal: Deteriorated  Lipids   Total Cholesterol: 133  (07/22/2009)   LDL: 64  (07/22/2009)   LDL Direct: Not documented   HDL: 53  (07/22/2009)   Triglycerides: 78  (07/22/2009)   Lipid panel due: 03/25/2010    SGOT (AST): 18  (07/22/2009)   SGPT (ALT): 17  (07/22/2009)   Alkaline phosphatase: 69  (07/22/2009)   Total bilirubin: 0.3  (07/22/2009)    Lipid flowsheet reviewed?: Yes   Progress toward LDL goal: At goal  Hypertension   Last Blood Pressure: 121 / 69  (12/12/2009)   Serum creatinine: 0.80  (12/04/2009)   BMP action: Ordered   Serum potassium 4.1  (12/04/2009)    Hypertension flowsheet reviewed?: Yes   Progress toward BP goal: At goal  Self-Management Support :   Personal Goals (by the next clinic visit) :     Personal A1C goal: 7  (07/21/2009)     Personal blood pressure goal: 140/90  (07/21/2009)     Personal LDL goal: 70  (07/21/2009)    Patient will work on the following items until the next clinic visit to reach self-care goals:     Medications and monitoring: take my  medicines every day, check my blood sugar, check my blood pressure, bring all of my medications to every visit  (12/12/2009)     Eating: eat more vegetables, use fresh or frozen vegetables, eat foods that are low in salt, eat baked foods instead of fried foods  (12/12/2009)     Activity: take a 30 minute walk every day  (12/04/2009)    Diabetes self-management support: Copy of home glucose meter record, Education handout, Written self-care plan  (12/12/2009)   Diabetes care plan printed   Diabetes education handout printed   Last diabetes self-management training by diabetes educator: 12/24/2008   Referred for diabetes self-mgmt training.   Last medical nutrition therapy: 12/24/2008    Hypertension self-management support: Copy of home glucose meter record, Education handout, Written self-care plan  (12/12/2009)   Hypertension self-care plan printed.   Hypertension education handout printed    Lipid self-management support: Copy of home glucose meter record, Education handout, Written self-care plan  (12/12/2009)   Lipid self-care plan printed.   Lipid education handout printed

## 2010-11-05 NOTE — Assessment & Plan Note (Signed)
Summary: back pain/gg   Vital Signs:  Patient profile:   60 year old female Height:      61 inches Weight:      216.3 pounds BMI:     41.02 Temp:     97.0 degrees F oral BP sitting:   140 / 78  (right arm)  Vitals Entered By: Filomena Jungling NT II (July 29, 2010 1:49 PM) CC: back pain Is Patient Diabetic? Yes Did you bring your meter with you today? No Pain Assessment Patient in pain? yes     Location: back Intensity: 10 Type: aching Onset of pain  Constant Nutritional Status BMI of > 30 = obese  Have you ever been in a relationship where you felt threatened, hurt or afraid?No   Does patient need assistance? Functional Status Self care Ambulation Normal   Primary Care Provider:  Blondell Reveal MD  CC:  back pain.  History of Present Illness: [name] - she was asked to take ibuprofen and tylenol for pain, increase neuronitn three time a day and was referred to orthopedic doctor - she  went to see Dr. Montez Morita ( orthopedic doctor) who as per the patient, said cannot do anything for the back - her pain is not controlled with this regime Dr.Carten  gave her oxycodone tablets which are not helping very much - she says she has difficulty passing urine this week. she feels like she wants to go void but when she goes she is not able to void completely.  - she has tried ibuprofen, alleve, tylenol, naproxen, - have not tried PT and would like to give it a try   Preventive Screening-Counseling & Management  Alcohol-Tobacco     Alcohol drinks/day: <1     Smoking Status: never     Passive Smoke Exposure: yes  Caffeine-Diet-Exercise     Does Patient Exercise: yes     Type of exercise: walk     Times/week: 5  Allergies: 1)  ! Benadryl 2)  ! * Flu Vaccination   Impression & Recommendations:  Problem # 1:  LOW BACK PAIN SYNDROME (ICD-724.2)  she has chronic back pain with DJD seen on xray now she has  developed some urinary retention since 1 week and tingling and numbness will get an MRI to r/o stenosis/nerve impingement will try PT, and flexeril along with conitnuing neurontin three times a day  she has been to ortho and was told they could not do anything more- will get records will give trmadol oince she finsihes oxycodone given by Dr. Montez Morita  She has difficulty working becaue of the back pain and she qualifies for short term disability. The plan in this short term is to work up the cause of her back pain and if it is degenerative joint disease or something untreatable, get her on a stable pain medicine regimen before she gets back to work.   Her updated medication list for this problem includes:    Aspirin 81 Mg Tbec (Aspirin) .Marland Kitchen... Take 1 tablet by mouth once a day    Ibuprofen 800 Mg Tabs (Ibuprofen) .Marland Kitchen... Take 1 tablet every 6 hours as needed for pain    Cyclobenzaprine Hcl 5 Mg Tabs (Cyclobenzaprine hcl) .Marland Kitchen... Three times a day  Orders: MRI with & without Contrast (MRI w&w/o Contrast) Physical Therapy Referral (PT)  Problem # 2:  DYSURIA (ICD-788.1) dipstick negative but s/s suggestive of uti- will treat  anyway  Her updated medication list for this problem includes:    Ciprofloxacin Hcl 250 Mg Tabs (Ciprofloxacin hcl) .Marland Kitchen... Take 1 tablet by mouth two times a day for 3 days  Orders: T-Urinalysis Dipstick only ([address]UE)  Complete Medication List: 1)  Metformin Hcl 1000 Mg Tabs (Metformin hcl) .... Take 1 tablet by mouth two times a day 2)  Lantus 100 Unit/ml Soln (Insulin glargine) .... Inject 72 units once daily. 3)  Bd Insulin Syringe Microfine 28g X 1/2" 0.5 Ml Misc (Insulin syringe-needle u-100) .... As directed 4)  Freestyle Test Strp (Glucose blood) .... Use to check blood sugar twice daily 5)  Lancets 30g Misc (Lancets) .... Use to check blood sugar twice a day 6)  Metoprolol Succinate 100 Mg Xr24h-tab (Metoprolol succinate) .... Take 1 tablet by mouth once a day 7)   Kay Ciel 20 Meq Pack (Potassium chloride) .... Take 1 tablet by mouth once a day 8)  Lipitor 40 Mg Tabs (Atorvastatin calcium) .... Take 1 tablet by mouth once a day 9)  Aspirin 81 Mg Tbec (Aspirin) .... Take 1 tablet by mouth once a day 10)  Neurontin 300 Mg Caps (Gabapentin) .... Take 1 tablet by mouth three times a day after day 3. 11)  Clonidine Hcl 0.2 Mg Tabs (Clonidine hcl) .... Take 1 tablet by mouth two times a day 12)  Centrum Tabs (Multiple vitamins-minerals) .... Take 1 tablet by mouth once a day 13)  Ibuprofen 800 Mg Tabs (Ibuprofen) .... Take 1 tablet every 6 hours as needed for pain 14)  Amlodipine Besylate 10 Mg Tabs (Amlodipine besylate) .... Take 1 tablet by mouth once a day 15)  Hydrochlorothiazide 25 Mg Tabs (Hydrochlorothiazide) .... Take 1 tablet by mouth once a day 16)  Ceron-dm 12.02-04-14 Mg/60ml Syrp (Phenylephrine-chlorphen-dm) .Marland Kitchen.. 1-2 teaspoons every 6-8 hours for cough. 17)  Loratadine 10 Mg Tabs (Loratadine) .... Take 1 tablet by mouth two times a day as needed for allergies 18)  Cyclobenzaprine Hcl 5 Mg Tabs (Cyclobenzaprine hcl) .... Three times a day 19)  Ciprofloxacin Hcl 250 Mg Tabs (Ciprofloxacin hcl) .... Take 1 tablet by mouth two times a day for 3 days  Patient Instructions: 1)  Please schedule a follow-up appointment in 1 month. Prescriptions: CIPROFLOXACIN HCL 250 MG TABS (CIPROFLOXACIN HCL) Take 1 tablet by mouth two times a day for 3 days  #6 x 0   Entered and Authorized by:   Bethel Born MD   Signed by:   Bethel Born MD on 07/29/2010   Method used:   Electronically to        [facility] [phone]* (retail)       [address]       [place], [place]  [address]       Ph: [phone]       Fax: [phone]   RxID:   [id number] CYCLOBENZAPRINE HCL 5 MG TABS (CYCLOBENZAPRINE HCL) three times a day  #90 x 0   Entered and Authorized by:   Bethel Born MD   Signed by:   Bethel Born MD on 07/29/2010   Method used:    Electronically to        [facility] [phone]* (retail)       [address]       [place], [place]  [address]       Ph: [phone]  Fax: [phone]   RxID:   [id number]    Orders Added: 1)  T-Urinalysis Dipstick only [81003QW] 2)  MRI with & without Contrast [MRI w&w/o Contrast] 3)  Physical Therapy Referral [PT] 4)  Est. Patient Level IV [30865]    Prevention & Chronic Care Immunizations   Influenza vaccine: Not documented   Influenza vaccine deferral: Not indicated  (01/30/2010)    Tetanus booster: 04/10/2009: Td    Pneumococcal vaccine: Not documented   Pneumococcal vaccine deferral: Refused  (03/25/2009)  Colorectal Screening   Hemoccult: Not documented   Hemoccult action/deferral: Not indicated  (01/30/2010)    Colonoscopy: Not documented   Colonoscopy action/deferral: Not indicated  (01/30/2010)   Colonoscopy due: 02/11/2010  Other Screening   Pap smear: Not documented   Pap smear action/deferral: Not indicated S/P hysterectomy  (03/25/2009)    Mammogram: BI-RADS CATEGORY 2:  Benign finding(s).^MM DIGITAL DIAGNOSTIC BILAT  (04/01/2009)   Mammogram due: 01/2009   Smoking status: never  (07/29/2010)  Diabetes Mellitus   HgbA1C: 7.1  (07/02/2010)   HgbA1C action/deferral: Ordered  (04/29/2010)   Hemoglobin A1C due: 10/13/2009    Eye exam: No diabetic retinopathy. OU   Visual acuity OD : 20/50     Visual acuity OS :  20/25    Intraocular pressure OD:  18    Intraocular pressure OS:   18     (02/26/2009)   Eye exam due: 02/26/2010    Foot exam: yes  (04/29/2010)   Foot exam action/deferral: Do today   High risk foot: No  (11/01/2008)   Foot care education: Done  (04/29/2010)   Foot exam due: 03/25/2010    Urine microalbumin/creatinine ratio: 21.6  (04/10/2009)   Urine microalbumin action/deferral: Refused   Urine microalbumin/cr due: 11/01/2009  Lipids   Total Cholesterol: 133  (07/22/2009)   LDL: 64   (07/22/2009)   LDL Direct: Not documented   HDL: 53  (07/22/2009)   Triglycerides: 78  (07/22/2009)   Lipid panel due: 03/25/2010    SGOT (AST): 16  (06/24/2010)   SGPT (ALT): 12  (06/24/2010)   Alkaline phosphatase: Holly  (06/24/2010)   Total bilirubin: 0.2  (06/24/2010)  Hypertension   Last Blood Pressure: 140 / 78  (07/29/2010)   Serum creatinine: 0.81  (06/24/2010)   BMP action: Ordered   Serum potassium 4.0  (06/24/2010)  Self-Management Support :   Personal Goals (by the next clinic visit) :     Personal A1C goal: 7  (07/21/2009)     Personal blood pressure goal: 140/90  (07/21/2009)     Personal LDL goal: 70  (07/21/2009)    Diabetes self-management support: Education handout, Resources for patients handout  (07/08/2010)   Last diabetes self-management training by diabetes educator: 12/18/2009   Last medical nutrition therapy: 12/24/2008    Hypertension self-management support: Education handout, Resources for patients handout  (07/08/2010)    Lipid self-management support: Education handout, Resources for patients handout  (07/08/2010)   Laboratory Results   Urine Tests  Date/Time Received: 07-29-10  Routine Urinalysis   Color: yellow Appearance: Clear Glucose: 250   (Normal Range: Negative) Bilirubin: negative   (Normal Range: Negative) Ketone: negative   (Normal Range: Negative) Spec. Gravity: >=1.030   (Normal Range: 1.003-1.035) Blood: negative   (Normal Range: Negative) pH: 5.0   (Normal Range: 5.0-8.0) Protein: negative   (Normal Range: Negative) Urobilinogen: 0.2   (Normal Range: 0-1) Nitrite: negative   (Normal Range: Negative) Leukocyte Esterace: negative   (Normal Range: Negative)  Appended Document: Orders Update    Clinical Lists Changes  Orders: Added new Test order of T-Culture, Urine [id number]) - Signed      Process Orders Check Orders Results:     [facility]: ABN not required for this insurance Order  queued for requisitioning for [facility]: July 29, 2010 4:16 PM Tests Sent for requisitioning (July 29, 2010 5:09 PM):     07/29/2010: [facility] -- T-Culture, Urine [[id number]] (signed)     Appended Document: Orders Update    Clinical Lists Changes  Orders: Added new Test order of T-Basic Metabolic Panel [id number]) - Signed      Process Orders Check Orders Results:     [facility]: ABN not required for this insurance Order queued for requisitioning for [facility]: August 05, 2010 9:11 AM Tests Sent for requisitioning (August 05, 2010 8:55 PM):     08/05/2010: [facility] -- T-Basic Metabolic Panel [id number] (signed)

## 2010-11-05 NOTE — Letter (Signed)
Summary: TWO WEEK GLUCOSE 06-25-2010-07-08-2010  TWO WEEK GLUCOSE 06-25-2010-07-08-2010   Imported By: Margie Billet 07/16/2010 14:24:58  _____________________________________________________________________  External Attachment:    Type:   Image     Comment:   External Document

## 2010-11-05 NOTE — Progress Notes (Signed)
Summary: call for appt/ hla  Phone Note Call from Patient   Summary of Call: pt calls and states she thinks she has pneumonia and wants to be seen, she states she coughs and hurts, she is referred to urg care and refuses, she is ak again to go to urg care she states no she will go to ED. Initial call taken by: Marin Roberts RN,  October 28, 2010 2:47 PM

## 2010-11-05 NOTE — Progress Notes (Signed)
  Phone Note Outgoing Call   Call placed by: Theotis Barrio NT II,  October 07, 2009 3:14 PM Call placed to: Patient Details for Reason: GI REFERRAL Summary of Call: CALLED PATIENT, NO ANSWER / PER Adena HILL CARD EXPIRED 12-29-010. UNABLE TO MAKE REFRRAL.  Marland Kitchen PATIENT WAS TO CALL ME IN THE PAST , SAYS SHE WAS GETTING HER OWN INSURANCE.  HAVE NOT HEARD ANYTHING FROM HER WITH THIS INFOMATION. LELA STURDIVANT NTII

## 2010-11-05 NOTE — Progress Notes (Signed)
Summary: phone/gg  Phone Note From Other Clinic   Summary of Call: Call from Lupita Leash a physical therapist from out pt rehab. stating pt came in for PT today and stated she was full of fluid.  SOB with ambulation.   BP 136/77 sats 98% pulse 77.  Pt feels tired and SOB.   She has appointment with Dr Comer Locket on Monday.  Will see today. Initial call taken by: Merrie Roof RN,  August 26, 2010 10:38 AM  Follow-up for Phone Call        OK Follow-up by: Ulyess Mort MD,  August 26, 2010 10:54 AM

## 2010-11-05 NOTE — Progress Notes (Addendum)
Summary: refill page 1 pt changing pharmacy/ hla  Phone Note Refill Request Message from:  Patient on October 27, 2010 8:49 AM  Refills Requested: Medication #1:  METFORMIN HCL 1000 MG TABS take 1 tablet by mouth two times a day   Dosage confirmed as above?Dosage Confirmed  Medication #2:  LANTUS 100 UNIT/ML SOLN Inject 72 units once daily.   Dosage confirmed as above?Dosage Confirmed  Medication #3:  METOPROLOL SUCCINATE 100 MG XR24H-TAB Take 1 tablet by mouth once a day   Dosage confirmed as above?Dosage Confirmed  Medication #4:  KAY CIEL 20 MEQ PACK Take 1 tablet by mouth once a day   Dosage confirmed as above?Dosage Confirmed page 1 needs new scripts changing to [facility]  Initial call taken by: Marin Roberts RN,  October 27, 2010 8:50 AM  Follow-up for Phone Call       Follow-up by: Blondell Reveal MD,  October 28, 2010 12:30 PM    Prescriptions: KAY CIEL 20 MEQ PACK (POTASSIUM CHLORIDE) Take 1 tablet by mouth once a day  #93 x 3   Entered and Authorized by:   Blondell Reveal MD   Signed by:   Blondell Reveal MD on 10/28/2010   Method used:   Faxed to ...       [facility] (retail)       [address] [place], [place]  [address]       Ph: [phone]       Fax: [phone]   RxID:   [id number] METOPROLOL SUCCINATE 100 MG XR24H-TAB (METOPROLOL SUCCINATE) Take 1 tablet by mouth once a day  #31 x 3   Entered and Authorized by:   Blondell Reveal MD   Signed by:   Blondell Reveal MD on 10/28/2010   Method used:   Faxed to ...       [facility] Department (retail)       [address] [place], [place]  [address]       Ph: [phone]       Fax: [phone]   RxID:   [id number] LANTUS 100 UNIT/ML SOLN (INSULIN GLARGINE) Inject 72 units once daily.  #1 month supp x 3   Entered and Authorized by:   Blondell Reveal MD   Signed by:   Blondell Reveal MD on 10/28/2010   Method used:   Faxed to ...       [facility] Department (retail)       [address] [place], [place]  [address]       Ph: [phone]       Fax: [phone]   RxID:   [id number] METFORMIN HCL 1000 MG TABS (METFORMIN HCL) take 1 tablet by mouth two times a day  #200 x 3   Entered and Authorized by:   Blondell Reveal MD   Signed by:   Blondell Reveal MD on 10/28/2010   Method used:   Faxed to ...       [facility] Department (retail)       [address] [place], [place]  [address]       Ph: [phone]       Fax: [phone]   RxID:   [id number]

## 2010-11-05 NOTE — Progress Notes (Signed)
Summary: change insulin back to lantus and refill/ hla  Phone Note Refill Request Message from:  Pharmacy on October 24, 2009 11:47 AM  Refills Requested: Medication #1:  METFORMIN HCL 1000 MG TABS take 1 tablet by mouth two times a day  Medication #2:  lantus pt now has insurance, would like a 90 day supply of metformin and lantus...wants her insulin changed back to lantus now that she has insurance  Initial call taken by: Marin Roberts RN,  October 24, 2009 11:49 AM  Follow-up for Phone Call        will change her back to lantus 28 units and needs to be adjusted from there on. Follow-up by: Blondell Reveal MD,  October 27, 2009 11:58 AM    New/Updated Medications: LANTUS 100 UNIT/ML SOLN (INSULIN GLARGINE) Inject 28 units subcutaneously once daily at bedtime. Prescriptions: METFORMIN HCL 1000 MG TABS (METFORMIN HCL) take 1 tablet by mouth two times a day  #62 x 6   Entered and Authorized by:   Blondell Reveal MD   Signed by:   Blondell Reveal MD on 10/27/2009   Method used:   Electronically to        [facility] [phone]* (retail)       [address]       [place], [place]  [address]       Ph: [phone]       Fax: [phone]   RxID:   [id number] LANTUS 100 UNIT/ML SOLN (INSULIN GLARGINE) Inject 28 units subcutaneously once daily at bedtime.  #1 x 6   Entered and Authorized by:   Blondell Reveal MD   Signed by:   Blondell Reveal MD on 10/27/2009   Method used:   Electronically to        [facility] [phone]* (retail)       [address]       [place], [place]  [address]       Ph: [phone]       Fax: [phone]   RxID:   [id number]

## 2010-11-05 NOTE — Letter (Signed)
Summary: New Patient letter  [facility]  [address] [place], [place] [address]   Phone: [phone]  Fax: [phone]       09/11/2010 MRN: [id number]  [facility] [address] [place], [place]  [address]  Dear [name],  Welcome to the Gastroenterology Division at [facility].    You are scheduled to see Dr.  Christella Hartigan on 10-23-10 at 11:00a.m. on the 3rd floor at [facility], [address]  We ask that you try to arrive at our office 15 minutes prior to your appointment time to allow for check-in.  We would like you to complete the enclosed self-administered evaluation form prior to your visit and bring it with you on the day of your appointment.  We will review it with you.  Also, please bring a complete list of all your medications or, if you prefer, bring the medication bottles and we will list them.  Please bring your insurance card so that we may make a copy of it.  If your insurance requires a referral to see a specialist, please bring your referral form from your primary care physician.  Co-payments are due at the time of your visit and may be paid by cash, check or credit card.     Your office visit will consist of a consult with your physician (includes a physical exam), any laboratory testing he/she may order, scheduling of any necessary diagnostic testing (e.g. x-ray, ultrasound, CT-scan), and scheduling of a procedure (e.g. Endoscopy, Colonoscopy) if required.  Please allow enough time on your schedule to allow for any/all of these possibilities.    If you cannot keep your appointment, please call [phone] to cancel or reschedule prior to your appointment date.  This allows [place] the opportunity to schedule an appointment for another patient in need of care.  If you do not cancel or reschedule by 5 p.m. the business day prior to your appointment date, you will be charged a $50.00 late cancellation/no-show fee.    Thank you for  choosing  Gastroenterology for your medical needs.  We appreciate the opportunity to care for you.  Please visit [place] at our website  to learn more about our practice.                     Sincerely,                                                             The Gastroenterology Division

## 2010-11-05 NOTE — Assessment & Plan Note (Signed)
Summary: 2WK F/U/BOGGALA/VS   Vital Signs:  Patient profile:   60 year old female Height:      61 inches (154.94 cm) Weight:      210.0 pounds (95.45 kg) BMI:     39.82 Temp:     97.7 degrees F (36.50 degrees C) oral Pulse rate:   65 / minute BP sitting:   137 / 69  (right arm) Cuff size:   large  Vitals Entered By: Theotis Barrio NT II (December 18, 2009 8:45 AM) CC: FOLLOEW UP VISIT /  WAS IN HOSPITAL LAST WEEK ([place]) Is Patient Diabetic? Yes Did you bring your meter with you today? Yes Nutritional Status BMI of > 30 = obese  Have you ever been in a relationship where you felt threatened, hurt or afraid?No   Does patient need assistance? Functional Status Self care Ambulation Normal Comments FOLLOW UP APPT /  PATIENT WAS IN THE HOSPITAL IN [place])   Primary Care Provider:  Blondell Reveal MD  CC:  FOLLOEW UP VISIT /  WAS IN HOSPITAL LAST WEEK ([place]).  History of Present Illness: [name] is a 60 year old Female with PMH/problems as outlined in the EMR, who presents to the [facility] for a short term follow up on her blood pressure and DM. Had a stressful one week, because of HTN crisis, went to [facility], they did an MRI to rule out stroke and made changes to her meds (pls see attached record). Now feeling fine. No new complaints today.   Preventive Screening-Counseling & Management  Alcohol-Tobacco     Alcohol drinks/day: <1     Smoking Status: never     Passive Smoke Exposure: yes  Caffeine-Diet-Exercise     Does Patient Exercise: yes     Type of exercise: walk     Times/week: 5  Current Medications (verified): 1)  Avapro 300 Mg  Tabs (Irbesartan) .... Take 1 Tablet By Mouth Once A Day 2)  Aspirin 81 Mg Tbec (Aspirin) .... Take 1 Tablet By Mouth Once A Day 3)  Lipitor 40 Mg Tabs (Atorvastatin Calcium) .... Take 1 Tablet By Mouth Once A Day 4)  Centrum  Tabs (Multiple Vitamins-Minerals) .... Take 1 Tablet By Mouth Once A Day 5)  Kay Ciel 20 Meq Pack  (Potassium Chloride) .... Take 1 Tablet By Mouth Once A Day 6)  Metformin Hcl 1000 Mg Tabs (Metformin Hcl) .... Take 1 Tablet By Mouth Two Times A Day 7)  Hydrochlorothiazide 25 Mg Tabs (Hydrochlorothiazide) .... Take 1 Tablet By Mouth Once A Day 8)  Toprol Xl 50 Mg Xr24h-Tab (Metoprolol Succinate) .... Take 1 Tablet By Mouth Two Times A Day 9)  Bd Insulin Syringe Microfine 28g X 1/2" 0.5 Ml  Misc (Insulin Syringe-Needle U-100) .... As Directed 10)  Lantus 100 Unit/ml Soln (Insulin Glargine) .... Inject 70 Units Once Daily. 11)  Neurontin 300 Mg Caps (Gabapentin) .... Take 1 Tablet Day 1, Then 1 Tablet Two Times A Day At Day 2, Then Start To Take 1 Tablet By Mouth Three Times A Day After Day 3. 12)  Tussionex Pennkinetic Er 8-10 Mg/40ml Lqcr (Chlorpheniramine-Hydrocodone) .... 5 Ml Twice Daily As Needed For Cough. 13)  Clonidine Hcl 0.1 Mg Tabs (Clonidine Hcl) .... Take 1 Tablet By Mouth Two Times A Day  Allergies (verified): 1)  ! Benadryl 2)  ! * Flu Vaccination  Past History:  Past Surgical History: Last updated: 04/18/2008 S/P surgery and chemotherapy for lt breast cancer. colon  polypectomy -2004. hysterectomy 1985.  Family History: Last updated: 04/18/2008 Family History Diabetes 1st degree relative: father passed of its complications at 24 Family History Lung cancer: mother died at 67 Brother died of a PE Sister died of breast CA  Social History: Last updated: 01/07/2009 Occupation: works Curator part time Single, lives by herself Never Smoked Alcohol use-no Drug use-no  Risk Factors: Alcohol Use: <1 (12/18/2009) Exercise: yes (12/18/2009)  Risk Factors: Smoking Status: never (12/18/2009) Passive Smoke Exposure: yes (12/18/2009)  Past Medical History: Breast cancer, hx of, left 2006 (invasive papillary CA, ductal CA in situ) Cardiac cath 11/07 showed no ischemia, no significant disease. EF 55% by ECHO 11/07 Diabetes mellitus, type  II Hypertension GERD Hyperlipidemia Obesity H/o colon polyp seen on Colonoscopy by Dr. Madilyn Fireman 1995(per pt) Repeat Colonoscopy by Dr. AOZH(08/65)                                   Isolated diverticulum in mid transverse colon.                                    Small non bleeding internal hemorrhoids                                   Repeat Colonoscopy every 5 years. Moderate OSA on sleep study(02/16/07) HTN crisis admitted to [facility] 3/11 (see record)  Review of Systems       as per HPI.   Physical Exam  General:  alert and overweight-appearing.  alert and overweight-appearing.   Lungs:  normal respiratory effort and normal breath sounds.  normal respiratory effort and normal breath sounds.   Heart:  normal rate and regular rhythm.  normal rate and regular rhythm.   Abdomen:  soft and non-tender.  soft and non-tender.   Pulses:  normal peripheral pulses  Extremities:  no cyanosis, clubbing or edema  Neurologic:  non focal Psych:  normally interactive.  normally interactive.     Impression & Recommendations:  Problem # 1:  DIABETES MELLITUS, TYPE II (ICD-250.00) Data reviewed: A1c: 9.3 (10/28/2009 8:41:45 AM)  MICROALB/CR: 21.6 (04/10/2009 9:18:00 PM) BP: 172 (12/22/2006 9:53:59 AM) /  97 (12/22/2006 9:53:59 AM) LDL: 64 (07/22/2009 9:17:00 PM) EYE: No diabetic retinopathy. OU   (02/26/2009 10:08:59 AM) FOOT: yes (04/10/2009 1:21:38 PM) Patient to be reviewed by Ms. Riley today. Of note, Lantus increased to 70 in [facility].  Her updated medication list for this problem includes:    Avapro 300 Mg Tabs (Irbesartan) .Marland Kitchen... Take 1 tablet by mouth once a day    Aspirin 81 Mg Tbec (Aspirin) .Marland Kitchen... Take 1 tablet by mouth once a day    Metformin Hcl 1000 Mg Tabs (Metformin hcl) .Marland Kitchen... Take 1 tablet by mouth two times a day    Lantus 100 Unit/ml Soln (Insulin glargine) ..... Inject 70 units once daily.  Problem # 2:  HYPERTENSION (ICD-401.9) Well-controlled. Continue the current  regimen.   Her updated medication list for this problem includes:    Avapro 300 Mg Tabs (Irbesartan) .Marland Kitchen... Take 1 tablet by mouth once a day    Hydrochlorothiazide 25 Mg Tabs (Hydrochlorothiazide) .Marland Kitchen... Take 1 tablet by mouth once a day    Toprol Xl 50 Mg Xr24h-tab (Metoprolol succinate) .Marland Kitchen... Take 1 tablet  by mouth two times a day    Clonidine Hcl 0.1 Mg Tabs (Clonidine hcl) .Marland Kitchen... Take 1 tablet by mouth two times a day  Her updated medication list for this problem includes:    Avapro 300 Mg Tabs (Irbesartan) .Marland Kitchen... Take 1 tablet by mouth once a day    Hydrochlorothiazide 25 Mg Tabs (Hydrochlorothiazide) .Marland Kitchen... Take 1 tablet by mouth once a day    Toprol Xl 100 Mg Tb24 (Metoprolol succinate) .Marland Kitchen... Take 1 tablet by mouth once a day    Clonidine Hcl 0.1 Mg Tabs (Clonidine hcl) .Marland Kitchen... Take 1 tablet by mouth two times a day  Problem # 3:  HYPERLIPIDEMIA (ICD-272.4) Data reviewed: Chol: 133 (07/22/2009 9:17:00 PM)HDL:  53 (07/22/2009 9:17:00 PM)LDL:  64 (07/22/2009 9:17:00 PM)Tri:   AST:  18 (07/22/2009 9:17:00 PM) ALT:  17 (07/22/2009 9:17:00 PM)T. Bili:  0.3 (07/22/2009 9:17:00 PM) AP:  69 (07/22/2009 9:17:00 PM)  Continue the current regimen.  Her updated medication list for this problem includes:    Lipitor 40 Mg Tabs (Atorvastatin calcium) .Marland Kitchen... Take 1 tablet by mouth once a day  Complete Medication List: 1)  Avapro 300 Mg Tabs (Irbesartan) .... Take 1 tablet by mouth once a day 2)  Aspirin 81 Mg Tbec (Aspirin) .... Take 1 tablet by mouth once a day 3)  Lipitor 40 Mg Tabs (Atorvastatin calcium) .... Take 1 tablet by mouth once a day 4)  Centrum Tabs (Multiple vitamins-minerals) .... Take 1 tablet by mouth once a day 5)  Kay Ciel 20 Meq Pack (Potassium chloride) .... Take 1 tablet by mouth once a day 6)  Metformin Hcl 1000 Mg Tabs (Metformin hcl) .... Take 1 tablet by mouth two times a day 7)  Hydrochlorothiazide 25 Mg Tabs (Hydrochlorothiazide) .... Take 1 tablet by mouth once a day 8)   Toprol Xl 50 Mg Xr24h-tab (Metoprolol succinate) .... Take 1 tablet by mouth two times a day 9)  Bd Insulin Syringe Microfine 28g X 1/2" 0.5 Ml Misc (Insulin syringe-needle u-100) .... As directed 10)  Lantus 100 Unit/ml Soln (Insulin glargine) .... Inject 70 units once daily. 11)  Neurontin 300 Mg Caps (Gabapentin) .... Take 1 tablet day 1, then 1 tablet two times a day at day 2, then start to take 1 tablet by mouth three times a day after day 3. 12)  Tussionex Pennkinetic Er 8-10 Mg/24ml Lqcr (Chlorpheniramine-hydrocodone) .... 5 ml twice daily as needed for cough. 13)  Clonidine Hcl 0.1 Mg Tabs (Clonidine hcl) .... Take 1 tablet by mouth two times a day  Patient Instructions: 1)  Please schedule a follow-up appointment in 3 months. 2)  Please keep up the good work with your blood pressure and diabetes.   Prevention & Chronic Care Immunizations   Influenza vaccine: Not documented   Influenza vaccine deferral: Deferred  (12/12/2009)    Tetanus booster: 04/10/2009: Td    Pneumococcal vaccine: Not documented   Pneumococcal vaccine deferral: Refused  (03/25/2009)  Colorectal Screening   Hemoccult: Not documented   Hemoccult action/deferral: Deferred  (12/12/2009)    Colonoscopy: Not documented   Colonoscopy action/deferral: Deferred  (12/12/2009)  Other Screening   Pap smear: Not documented   Pap smear action/deferral: Not indicated S/P hysterectomy  (03/25/2009)    Mammogram: BI-RADS CATEGORY 2:  Benign finding(s).^MM DIGITAL DIAGNOSTIC BILAT  (04/01/2009)   Mammogram due: 01/2009   Smoking status: never  (12/18/2009)  Diabetes Mellitus   HgbA1C: 9.3  (10/28/2009)   Hemoglobin A1C due: 10/13/2009  Eye exam: No diabetic retinopathy. OU   Visual acuity OD : 20/50     Visual acuity OS :  20/25    Intraocular pressure OD:  18    Intraocular pressure OS:   18     (02/26/2009)   Eye exam due: 02/26/2010    Foot exam: yes  (04/10/2009)   High risk foot: No  (11/01/2008)    Foot care education: Not documented   Foot exam due: 03/25/2010    Urine microalbumin/creatinine ratio: 21.6  (04/10/2009)   Urine microalbumin/cr due: 11/01/2009    Diabetes flowsheet reviewed?: Yes   Progress toward A1C goal: Unchanged  Lipids   Total Cholesterol: 133  (07/22/2009)   LDL: 64  (07/22/2009)   LDL Direct: Not documented   HDL: 53  (07/22/2009)   Triglycerides: 78  (07/22/2009)   Lipid panel due: 03/25/2010    SGOT (AST): 18  (07/22/2009)   SGPT (ALT): 17  (07/22/2009)   Alkaline phosphatase: 69  (07/22/2009)   Total bilirubin: 0.3  (07/22/2009)    Lipid flowsheet reviewed?: Yes   Progress toward LDL goal: At goal  Hypertension   Last Blood Pressure: 137 / 69  (12/18/2009)   Serum creatinine: 0.80  (12/04/2009)   BMP action: Ordered   Serum potassium 4.1  (12/04/2009)    Hypertension flowsheet reviewed?: Yes   Progress toward BP goal: Improved  Self-Management Support :   Personal Goals (by the next clinic visit) :     Personal A1C goal: 7  (07/21/2009)     Personal blood pressure goal: 140/90  (07/21/2009)     Personal LDL goal: 70  (07/21/2009)    Patient will work on the following items until the next clinic visit to reach self-care goals:     Medications and monitoring: take my medicines every day, check my blood pressure  (12/18/2009)     Eating: drink diet soda or water instead of juice or soda, eat more vegetables, use fresh or frozen vegetables, eat foods that are low in salt, eat baked foods instead of fried foods, eat fruit for snacks and desserts, limit or avoid alcohol  (12/18/2009)     Activity: take a 30 minute walk every day  (12/18/2009)    Diabetes self-management support: Resources for patients handout  (12/18/2009)   Last diabetes self-management training by diabetes educator: 12/24/2008   Last medical nutrition therapy: 12/24/2008    Hypertension self-management support: Resources for patients handout  (12/18/2009)    Lipid  self-management support: Resources for patients handout  (12/18/2009)        Resource handout printed.

## 2010-11-05 NOTE — Assessment & Plan Note (Signed)
Summary: [name]  Is Patient Diabetic? Yes Did you bring your meter with you today? Yes   Allergies: 1)  ! Benadryl 2)  ! * Flu Vaccination   Complete Medication List: 1)  Avapro 300 Mg Tabs (Irbesartan) .... Take 1 tablet by mouth once a day 2)  Aspirin 81 Mg Tbec (Aspirin) .... Take 1 tablet by mouth once a day 3)  Lipitor 40 Mg Tabs (Atorvastatin calcium) .... Take 1 tablet by mouth once a day 4)  Centrum Tabs (Multiple vitamins-minerals) .... Take 1 tablet by mouth once a day 5)  Kay Ciel 20 Meq Pack (Potassium chloride) .... Take 1 tablet by mouth once a day 6)  Metformin Hcl 1000 Mg Tabs (Metformin hcl) .... Take 1 tablet by mouth two times a day 7)  Hydrochlorothiazide 25 Mg Tabs (Hydrochlorothiazide) .... Take 1 tablet by mouth once a day 8)  Toprol Xl 50 Mg Xr24h-tab (Metoprolol succinate) .... Take 1 tablet by mouth two times a day 9)  Bd Insulin Syringe Microfine 28g X 1/2" 0.5 Ml Misc (Insulin syringe-needle u-100) .... As directed 10)  Lantus 100 Unit/ml Soln (Insulin glargine) .... Inject 70 units once daily. 11)  Neurontin 300 Mg Caps (Gabapentin) .... Take 1 tablet day 1, then 1 tablet two times a day at day 2, then start to take 1 tablet by mouth three times a day after day 3. 12)  Tussionex Pennkinetic Er 8-10 Mg/35ml Lqcr (Chlorpheniramine-hydrocodone) .... 5 ml twice daily as needed for cough. 13)  Clonidine Hcl 0.1 Mg Tabs (Clonidine hcl) .... Take 1 tablet by mouth two times a day 14)  Freestyle Test Strp (Glucose blood) .... Use to check blood sugar twice daily 15)  Lancets 30g Misc (Lancets) .... 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 Yes Anti-platelet Meds? Yes Comments: thought CBG yesterday of 134 in am was "low" so self adjusted 70 units lantus to 50 units last night. Reports aware of symptoms of hypoglycemia and has had none. Reveiwed with her to be sure and prper treatemtne and insulin adjustment guidelines. Discussed self adjusting by 1-2 units at a time and that we considered 80-130 fasting to be target. encouraged her to choose a target that she is comfortable with- she asid it used to be an A1C of 6 but now ashe'd be happy wiht a "7". Encouraged am sugars < 130 and not sugar to be > 180 to acheive this. She agreed to stay with 70 units of lantus. also discussed BCBS benefits for self monitoring for when she runs out of strips from health department. Discussed emter options and suggested she call for a prescription if she changes meters. Meter download showed improved blood sugar over past week on 70 units lantus with 83% in target over past month  Long Acting  Insulin Type:Lantus  Bedtime Dose: 70    Monitoring Self  monitoring blood glucose 2 times a day Name of Meter  freestyle freedom  Recent Episodes of: Requiring Help from another person  Hyperglycemia : Yes Hypoglycemia: No Severe Hypoglycemia : No   Carrys Food for Low Blood sugar Yes Can you tell if your blood sugar is low? Yes   Salt: says she leaves the salt box alone  Nutrition assessment Weight change: up and down - concerned for fluid What beverages do you drink?  soda and juice too sweet now- drinks water mostly Biggest challenge to eating healthy: Eating too much Diabetes  Disease Process  Discussed today  Medications State insulin adjustment guidelines: Needs review/assistance    Nutritional Management Identify what foods most often affect blood glucose: Needs review/assistance    State changes planned for home meals/snacks: Needs review/assistance    Monitoring State purpose and frequency of monitoring BG-ketones-HgbA1C  : Needs review/assistance   Perform glucose monitoring/ketone testing and record results correctly: Demonstrates competencyState target blood glucose and HgbA1C goals: Needs review/assistance    Complications State the causes-signs and symptoms and prevention of Hyperglycemia: Needs review/assistanceExplain proper treatment of hyperglycemia: Needs review/assistanceState the causes- signs and symptoms and prevention of hypoglycemia: Needs review/assistance   Explain proper treatment of hypoglycemia: Needs review/assistance    Exercise  Psychosocial Adjustment Identify two methods to cope with these feelings: Coping SkillsDiabetes Management Education Done: 12/18/2009    BEHAVIORAL GOALS INITIAL Monitoring blood glucose levels daily: call BCBS or read book for benefits        Diabetes Self Management Support: daughter and clinic staff Follow-up: advised she find out BCBS benefits before scheduling a follow-up

## 2010-11-05 NOTE — Assessment & Plan Note (Signed)
Summary: PAINFUL FEET AND HAND/FMLA PAPERWORK/BOGGALA/DS   Vital Signs:  Patient profile:   60 year old female Height:      61 inches (154.94 cm) Weight:      218.2 pounds (99.18 kg) BMI:     41.38 Temp:     97.1 degrees F (36.17 degrees C) oral Pulse rate:   78 / minute BP sitting:   185 / 92  (right arm) Cuff size:   regular  Vitals Entered By: Theotis Barrio NT II (December 04, 2009 8:34 AM) CC: PAINFUL FEET AND HAND / FMLA PAPER WORK  Is Patient Diabetic? Yes Did you bring your meter with you today? Yes Pain Assessment Patient in pain? yes     Location: FEET/BACK/HAND Intensity: 10 / 10 /  10 Type: BURNS/STING Onset of pain  ABOUT 2 WEEKS AGO  /  CHRONIC BACK PAIN Nutritional Status BMI of > 30 = obese  Have you ever been in a relationship where you felt threatened, hurt or afraid?No   Does patient need assistance? Functional Status Self care Ambulation Normal Comments CHRONIC BACK PAIN /  BACK AND FEET PAIN   Primary Care Provider:  Blondell Reveal MD  CC:  PAINFUL FEET AND HAND / FMLA PAPER WORK .  History of Present Illness: [name] is a 60 year old Female with PMH/problems as outlined in the EMR, who presents to the [facility] to get her FMLA form filled up.  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   Ms. Bagsby continues to be bothered by joint pains. Her CBG are mostly in 150 and her blood pressure is always high. But she says she is taking her meds as prescribed. She continues to be bothered by occasional cough, mostly seasonal. She does not have any new complaints today.   Depression History:      The patient denies a depressed mood most of the day and a diminished interest in her usual daily activities.         Preventive Screening-Counseling & Management  Alcohol-Tobacco     Alcohol drinks/day: <1     Smoking Status: never     Passive Smoke Exposure:  yes  Caffeine-Diet-Exercise     Does Patient Exercise: yes     Type of exercise: walk     Times/week: 5  Current Medications (verified): 1)  Avapro 300 Mg  Tabs (Irbesartan) .... Take 1 Tablet By Mouth Once A Day 2)  Aspirin 81 Mg Tbec (Aspirin) .... Take 1 Tablet By Mouth Once A Day 3)  Zyrtec 10 Mg Tabs (Cetirizine Hcl) .... Take 1 Tablet By Mouth Once A Day As Needed 4)  Lipitor 40 Mg Tabs (Atorvastatin Calcium) .... Take 1 Tablet By Mouth Once A Day 5)  Centrum  Tabs (Multiple Vitamins-Minerals) .... Take 1 Tablet By Mouth Once A Day 6)  Kay Ciel 20 Meq Pack (Potassium Chloride) .... Take 1 Tablet By Mouth Once A Day 7)  Metformin Hcl 1000 Mg Tabs (Metformin Hcl) .... Take 1 Tablet By Mouth Two Times A Day 8)  Hydrochlorothiazide 25 Mg Tabs (Hydrochlorothiazide) .... Take 1 Tablet By Mouth Once A Day 9)  Toprol Xl 100 Mg  Tb24 (Metoprolol Succinate) .... Take 1 Tablet By Mouth Once A Day 10)  Promethazine Hcl 12.5 Mg  Tabs (Promethazine Hcl) .... Take 1 Tablet By Mouth Every 8 Hours As Needed  For Nausea 11)  Bd Insulin Syringe Microfine 28g X 1/2" 0.5 Ml  Misc (Insulin Syringe-Needle U-100) .... As Directed 12)  Alprazolam 0.5 Mg  Tabs (Alprazolam) .... Take 1 Tab By Mouth At Bedtime 13)  Norvasc 10 Mg Tabs (Amlodipine Besylate) .... Take 1 Tablet By Mouth Once A Day 14)  Tylenol Extra Strength 500 Mg  Tabs (Acetaminophen) .... Take 2 Tablet Three Times Daily As Needed For Pain. 15)  Lantus 100 Unit/ml Soln (Insulin Glargine) .... Inject 36 Units Subcutaneously Once Daily At Bedtime. 16)  Neurontin 300 Mg Caps (Gabapentin) .... Take 1 Tablet Day 1, Then 1 Tablet Two Times A Day At Day 2, Then Start To Take 1 Tablet By Mouth Three Times A Day After Day 3. 17)  Nasonex 50 Mcg/act Susp (Mometasone Furoate) .... 2 Sprays Per Nostril Daily  Allergies (verified): 1)  ! Benadryl 2)  ! * Flu Vaccination  Past History:  Past Medical History: Last updated: 09/14/2007 Breast cancer, hx of,  left 2006 (invasive papillary CA, ductal CA in situ) Cardiac cath 11/07 showed no ischemia, no significant disease. EF 55% by ECHO 11/07 Diabetes mellitus, type II Hypertension GERD Hyperlipidemia Obesity H/o colon polyp seen on Colonoscopy by Dr. Madilyn Fireman 1995(per pt) Repeat Colonoscopy by Dr. ZOXW(96/04)                                   Isolated diverticulum in mid transverse colon.                                    Small non bleeding internal hemorrhoids                                   Repeat Colonoscopy every 5 years. Moderate OSA on sleep study(02/16/07)  Past Surgical History: Last updated: 04/18/2008 S/P surgery and chemotherapy for lt breast cancer. colon polypectomy -2004. hysterectomy 1985.  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   Neck:  supple.  no jvd.  Lungs:  normal respiratory effort and normal breath sounds.   Heart:  normal rate and regular rhythm.   Abdomen:  soft and non-tender.   Msk:  mild tenderness over back, right ankle and left wrist.  Pulses:  normal peripheral pulses  Extremities:  mild pitting edema bilataral.  Neurologic:  non focal.    Impression & Recommendations:  Problem # 1:  HYPERTENSION (ICD-401.9) Repeat BP 160/90, still high. Patient is in pain due to joint problems. I would like to do a short term review on this problem. She needs to  be reviewed when she is pain free.   Her updated medication list for this problem includes:    Avapro 300 Mg Tabs (Irbesartan) .Marland Kitchen... Take  1 tablet by mouth once a day    Hydrochlorothiazide 25 Mg Tabs (Hydrochlorothiazide) .Marland Kitchen... Take 1 tablet by mouth once a day    Toprol Xl 100 Mg Tb24 (Metoprolol succinate) .Marland Kitchen... Take 1 tablet by mouth once a day    Norvasc 10 Mg Tabs (Amlodipine besylate) .Marland Kitchen... Take 1 tablet by mouth once a day  Orders: T-Basic Metabolic Panel [phone])  Problem # 2:  DIABETES MELLITUS, TYPE II (ICD-250.00) Data reviewed: A1c: 9.3 (10/28/2009 8:41:45 AM)  MICROALB/CR: 21.6 (04/10/2009 9:18:00 PM) LDL: 64 (07/22/2009 9:17:00 PM) EYE: No diabetic retinopathy. OU   Visual acuity OD : 20/50     Visual acuity OS :  20/25    Intraocular pressure OD:  18    Intraocular pressure OS:   18    (02/26/2009 10:08:59 AM) FOOT: yes (04/10/2009 1:21:38 PM) WEIGHT: 41.38 (12/04/2009 8:16:48 AM)  I reviewed CBG readings, mostly in 150 range, will advise her to go up on lantus by 1 - 2 units up to 40 a day, until fasting is less than 130 and post meal less than 180. No other changes today.  Her updated medication list for this problem includes:    Avapro 300 Mg Tabs (Irbesartan) .Marland Kitchen... Take 1 tablet by mouth once a day    Aspirin 81 Mg Tbec (Aspirin) .Marland Kitchen... Take 1 tablet by mouth once a day    Metformin Hcl 1000 Mg Tabs (Metformin hcl) .Marland Kitchen... Take 1 tablet by mouth two times a day    Lantus 100 Unit/ml Soln (Insulin glargine) ..... Inject 36 units subcutaneously once daily at bedtime.  Problem # 3:  HYPERLIPIDEMIA (ICD-272.4) Chol: 133 (07/22/2009 9:17:00 PM)HDL:  53 (07/22/2009 9:17:00 PM)LDL:  64 (07/22/2009 9:17:00 PM)Tri:   AST:  18 (07/22/2009 9:17:00 PM) ALT:  17 (07/22/2009 9:17:00 PM)T. Bili:  0.3 (07/22/2009 9:17:00 PM) AP:  69 (07/22/2009 9:17:00 PM)  Data reviewed. Continue current regimen.   Her updated medication list for this problem includes:    Lipitor 40 Mg  Tabs (Atorvastatin calcium) .Marland Kitchen... Take 1 tablet by mouth once a day  Problem # 4:  PAIN IN JOINT, LOWER LEG (ICD-719.46) Continue with pain meds for now. Will consider sports med referral if continuous pain. Will review next time for possible joint injections with sports medicine.  Her updated medication list for this problem includes:    Aspirin 81 Mg Tbec (Aspirin) .Marland Kitchen... Take 1 tablet by mouth once a day    Tylenol Extra Strength 500 Mg Tabs (Acetaminophen) .Marland Kitchen... Take 2 tablet three times daily as needed for pain.  Problem # 5:  SYMPTOM, COUGH (ICD-786.2) This is likely allergic, seasonal. Will treat her symptomatically for now.   Complete Medication List: 1)  Avapro 300 Mg Tabs (Irbesartan) .... Take 1 tablet by mouth once a day 2)  Aspirin 81 Mg Tbec (Aspirin) .... Take 1 tablet by mouth once a day 3)  Zyrtec 10 Mg Tabs (Cetirizine hcl) .... Take 1 tablet by mouth once a day as needed 4)  Lipitor 40 Mg Tabs (Atorvastatin calcium) .... Take 1 tablet by mouth once a day 5)  Centrum Tabs (Multiple vitamins-minerals) .... Take 1 tablet by mouth once a day 6)  Kay Ciel 20 Meq Pack (Potassium chloride) .... Take 1 tablet by mouth once a day 7)  Metformin Hcl 1000 Mg Tabs (Metformin hcl) .... Take 1 tablet by mouth two times a day 8)  Hydrochlorothiazide 25 Mg Tabs (Hydrochlorothiazide) .... Take 1 tablet by mouth once a day  9)  Toprol Xl 100 Mg Tb24 (Metoprolol succinate) .... Take 1 tablet by mouth once a day 10)  Promethazine Hcl 12.5 Mg Tabs (Promethazine hcl) .... Take 1 tablet by mouth every 8 hours as needed for nausea 11)  Bd Insulin Syringe Microfine 28g X 1/2" 0.5 Ml Misc (Insulin syringe-needle u-100) .... As directed 12)  Alprazolam 0.5 Mg Tabs (Alprazolam) .... Take 1 tab by mouth at bedtime 13)  Norvasc 10 Mg Tabs (Amlodipine besylate) .... Take 1 tablet by mouth once a day 14)  Tylenol Extra Strength 500 Mg Tabs (Acetaminophen) .... Take 2 tablet three times daily as needed for  pain. 15)  Lantus 100 Unit/ml Soln (Insulin glargine) .... Inject 36 units subcutaneously once daily at bedtime. 16)  Neurontin 300 Mg Caps (Gabapentin) .... Take 1 tablet day 1, then 1 tablet two times a day at day 2, then start to take 1 tablet by mouth three times a day after day 3. 17)  Nasonex 50 Mcg/act Susp (Mometasone furoate) .... 2 sprays per nostril daily 18)  Tussionex Pennkinetic Er 8-10 Mg/24ml Lqcr (Chlorpheniramine-hydrocodone) .... 5 ml twice daily as needed for cough.  Patient Instructions: 1)  Please schedule a follow-up appointment in 2 weeks. 2)  Aim to have your blood sugar level as follows: 3)  fasting: less than 130 4)  after meal less than 180 Prescriptions: TUSSIONEX PENNKINETIC ER 8-10 MG/5ML LQCR (CHLORPHENIRAMINE-HYDROCODONE) 5 mL twice daily as needed for cough.  #1 x 1   Entered and Authorized by:   Zara Council MD   Signed by:   Zara Council MD on 12/04/2009   Method used:   Reprint   RxID:   [id number] TUSSIONEX PENNKINETIC ER 8-10 MG/5ML LQCR (CHLORPHENIRAMINE-HYDROCODONE) 5 mL twice daily as needed for cough.  #1 x 1   Entered and Authorized by:   Zara Council MD   Signed by:   Zara Council MD on 12/04/2009   Method used:   Print then Give to Patient   RxID:   [id number]   Prevention & Chronic Care Immunizations   Influenza vaccine: Not documented   Influenza vaccine deferral: Refused  (10/28/2009)    Tetanus booster: 04/10/2009: Td    Pneumococcal vaccine: Not documented   Pneumococcal vaccine deferral: Refused  (03/25/2009)  Colorectal Screening   Hemoccult: Not documented   Hemoccult action/deferral: Ordered  (03/25/2009)    Colonoscopy: Not documented   Colonoscopy action/deferral: GI referral  (09/12/2009)  Other Screening   Pap smear: Not documented   Pap smear action/deferral: Not indicated S/P hysterectomy  (03/25/2009)    Mammogram: BI-RADS CATEGORY 2:  Benign finding(s).^MM DIGITAL DIAGNOSTIC BILAT   (04/01/2009)   Mammogram due: 01/2009   Smoking status: never  (12/04/2009)  Diabetes Mellitus   HgbA1C: 9.3  (10/28/2009)   Hemoglobin A1C due: 10/13/2009    Eye exam: No diabetic retinopathy. OU   Visual acuity OD : 20/50     Visual acuity OS :  20/25    Intraocular pressure OD:  18    Intraocular pressure OS:   18     (02/26/2009)   Eye exam due: 02/26/2010    Foot exam: yes  (04/10/2009)   High risk foot: No  (11/01/2008)   Foot care education: Not documented   Foot exam due: 03/25/2010    Urine microalbumin/creatinine ratio: 21.6  (04/10/2009)   Urine microalbumin/cr due: 11/01/2009    Diabetes flowsheet reviewed?: Yes   Progress toward A1C goal: Unchanged  Lipids   Total  Cholesterol: 133  (07/22/2009)   LDL: 64  (07/22/2009)   LDL Direct: Not documented   HDL: 53  (07/22/2009)   Triglycerides: 78  (07/22/2009)   Lipid panel due: 03/25/2010    SGOT (AST): 18  (07/22/2009)   SGPT (ALT): 17  (07/22/2009)   Alkaline phosphatase: 69  (07/22/2009)   Total bilirubin: 0.3  (07/22/2009)    Lipid flowsheet reviewed?: Yes   Progress toward LDL goal: At goal  Hypertension   Last Blood Pressure: 185 / 92  (12/04/2009)   Serum creatinine: 1.03  (07/22/2009)   BMP action: Ordered   Serum potassium 4.1  (07/22/2009)    Hypertension flowsheet reviewed?: Yes   Progress toward BP goal: Unchanged  Self-Management Support :   Personal Goals (by the next clinic visit) :     Personal A1C goal: 7  (07/21/2009)     Personal blood pressure goal: 140/90  (07/21/2009)     Personal LDL goal: 70  (07/21/2009)    Patient will work on the following items until the next clinic visit to reach self-care goals:     Medications and monitoring: take my medicines every day, check my blood sugar, bring all of my medications to every visit, examine my feet every day  (12/04/2009)     Eating: drink diet soda or water instead of juice or soda, eat more vegetables, use fresh or frozen  vegetables, eat foods that are low in salt, eat baked foods instead of fried foods, eat fruit for snacks and desserts, limit or avoid alcohol  (12/04/2009)     Activity: take a 30 minute walk every day  (12/04/2009)    Diabetes self-management support: Resources for patients handout  (12/04/2009)   Last diabetes self-management training by diabetes educator: 12/24/2008   Last medical nutrition therapy: 12/24/2008    Hypertension self-management support: Resources for patients handout  (12/04/2009)    Lipid self-management support: Resources for patients handout  (12/04/2009)        Resource handout printed.  Process Orders Check Orders Results:     [facility]: ABN not required for this insurance Tests Sent for requisitioning (December 04, 2009 9:42 AM):     12/04/2009: [facility] -- T-Basic Metabolic Panel [phone] (signed)    Appended Document: PAINFUL FEET AND HAND/FMLA PAPERWORK/BOGGALA/DS dose of tussinex was changed from #1 to 120 cc per Dr Polly Cobia.  Pharmacy informed.

## 2010-11-05 NOTE — Letter (Signed)
Summary: [employer]  MeterDownLoad   Imported By: Florinda Marker 12/16/2009 09:46:37  _____________________________________________________________________  External Attachment:    Type:   Image     Comment:   External Document

## 2010-11-05 NOTE — Letter (Signed)
Summary: [facility] Improvement:FLMA  [facility] Improvement:FLMA   Imported By: Florinda Marker 12/05/2009 16:08:18  _____________________________________________________________________  External Attachment:    Type:   Image     Comment:   External Document

## 2010-11-05 NOTE — Letter (Signed)
Summary: TWO WEEK GLUCOSE   TWO WEEK GLUCOSE   Imported By: Margie Billet 08/31/2010 11:04:29  _____________________________________________________________________  External Attachment:    Type:   Image     Comment:   External Document

## 2010-11-05 NOTE — Progress Notes (Signed)
Summary: short term disability  Phone Note Call from Patient Call back at [phone]   Caller: Patient Call For: Blondell Reveal MD Reason for Call: Talk to Doctor Details for Reason: short term disability Summary of Call: Mrs. asher is having a hard time at work with her back pain and her feet,also she wants to be sent to an Allergist to see about herself because she had mold in the apartment that she lived in which has benn reported.The job has asked her to go on short term disability so that can get the medical care she needs and and they can continue to pay her.The job does not have sick days so everytime she is out she does not get paid.A letter from her doctor would help with this process.The Letter needs to go to [employer] fax number is [phone]. Initial call taken by: Filomena Jungling NT II,  July 27, 2010 12:23 PM  Follow-up for Phone Call        If her low back pain, feet pain are bothering her, she needs to be evaluated in the clinic before considering short term disability or a letter to the insurance company. Please make an appointment in 2- 4weeks for evaluation re: need for short term disability. Follow-up by: Blondell Reveal MD,  July 27, 2010 3:45 PM

## 2010-11-05 NOTE — Miscellaneous (Signed)
Summary: ED Visit Note  ED VISIT NOTE  07/03/2010  Reason for visit: "Feeling Drunk"  Brief History: Patient states that she she felt some chest tightness yesterday afternoon and evening. She noticed some SOB associated with the tightness. However, these symptoms resolved after she took 2 metoprolol pills that she is prescribed for her malignant hypertension/ This morning, upon awakening, she no longer had any chest tightness or SOB. However, she states that she felt "drunk" and was lightheaded. After getting some blood work done, she was still feeling lightheaded, so went to the ED. At the current time, Ms. Kesling denies any chest tightness or SOB, but still feels a bit dizzy.  PE: Gen: NAD, eating dinner [facility]: RRR, no m/g/r Lungs: Clear to auscultation Abdomen: soft, NT, ND, BS + Extr: No edema noted on exam Pulses: 2+ DP bilaterally Neuro: A&O X3  Labs:    Sodium (NA)                              139               135-145          mEq/L  Potassium (K)                            4.0               3.5-5.1          mEq/L  Chloride                                 108               96-112           mEq/L  CO2                                      24                19-32            mEq/L  Glucose                                  142        h      70-99            mg/dL  BUN                                      16                6-23             mg/dL  Creatinine                               0.78              0.4-1.2          mg/dL  GFR, Est Non African American            >  60               >60              mL/min  GFR, Est African American                >60               >60              mL/min    Oversized comment, see footnote  1  Bilirubin, Total                         0.4               0.3-1.2          mg/dL  Alkaline Phosphatase                     66                39-117           U/L  SGOT (AST)                               21                0-37             U/L  SGPT (ALT)                                15                0-35             U/L  Total  Protein                           7.6               6.0-8.3          g/dL  Albumin-Blood                            3.7               3.5-5.2          g/dL  Calcium                                  9.1               8.4-10.5         mg/dL   CKMB, POC                                <1.0       l      1.0-8.0          ng/mL  Troponin I, POC                          <0.05             0.00-0.09  ng/mL  Myoglobin, POC                           64.3              12-200           ng/mL  WBC                                      6.5               4.0-10.5         K/uL  RBC                                      3.96              3.87-5.11        MIL/uL  Hemoglobin (HGB)                         10.5       l      12.0-15.0        g/dL  Hematocrit (HCT)                         33.5       l      36.0-46.0        %  MCV                                      84.6              78.0-100.0       fL  MCH -                                    26.5              26.0-34.0        pg  MCHC                                     31.3              30.0-36.0        g/dL  RDW                                      17.5       h      11.5-15.5        %  Platelet Count (PLT)                     243               150-400          K/uL  Neutrophils, %  44                43-77            %  Lymphocytes, %                           46                12-46            %  Monocytes, %                             7                 3-12             %  Eosinophils, %                           3                 0-5              %  Basophils, %                             0                 0-1              %  Neutrophils, Absolute                    2.9               1.7-7.7          K/uL  Lymphocytes, Absolute                    3.0               0.7-4.0          K/uL  Monocytes, Absolute                      0.5               0.1-1.0           K/uL  Eosinophils, Absolute                    0.2               0.0-0.7          K/uL  Basophils, Absolute                      0.0               0.0-0.1          K/uL  D-Dimer, Fibrin Derivatives              0.61       h      0.00-0.48        ug/mL-FEU  Assessment: 1. Episode of atypical chest pain- Differential of ACS vs Anxiety vs MSK. Ms. Horton had a cardiac cath in 2007 which showed a 50% occlusion. She had a follow-up visit with Dr. Jens Som in 2009 for a Myoview stress test,  which was read as normal with mild ischemia. Patient's chest pain was completely resolved since this morning. During her ED stay, she had cardiac enzymes x2 which were stable and WNL and a CTA chest which was negative for PE. Patient also had an EKG done which did not show any acute ST changes. Will have patient scheduled for outpatient visit next week to review recent blood work and imaging studies. Of note, patient recently had a CT of her chest 9/28 which was performed for further work-up of hemoptysis in patient with history of breast cancer that has not yet been read.   2. Muscle cramps - Patient states they are quite bothersome. Intermittent, radiate upwards.   Follow-up needed: Outpatient [facility]. Appointment scheduled.     Complete Medication List: 1)  Metformin Hcl 1000 Mg Tabs (Metformin hcl) .... Take 1 tablet by mouth two times a day 2)  Lantus 100 Unit/ml Soln (Insulin glargine) .... Inject 72 units once daily. 3)  Bd Insulin Syringe Microfine 28g X 1/2" 0.5 Ml Misc (Insulin syringe-needle u-100) .... As directed 4)  Freestyle Test Strp (Glucose blood) .... Use to check blood sugar twice daily 5)  Lancets 30g Misc (Lancets) .... Use to check blood sugar twice a day 6)  Metoprolol Succinate 100 Mg Xr24h-tab (Metoprolol succinate) .... Take 1 tablet by mouth once a day 7)  Kay Ciel 20 Meq Pack (Potassium chloride) .... Take 1 tablet by mouth once a day 8)  Lipitor 40 Mg Tabs  (Atorvastatin calcium) .... Take 1 tablet by mouth once a day 9)  Aspirin 81 Mg Tbec (Aspirin) .... Take 1 tablet by mouth once a day 10)  Neurontin 300 Mg Caps (Gabapentin) .... Take 1 tablet day 1, then 1 tablet two times a day at day 2, then start to take 1 tablet by mouth three times a day after day 3. 11)  Clonidine Hcl 0.2 Mg Tabs (Clonidine hcl) .... Take 1 tablet by mouth two times a day 12)  Centrum Tabs (Multiple vitamins-minerals) .... Take 1 tablet by mouth once a day 13)  Ibuprofen 800 Mg Tabs (Ibuprofen) .... Take 1 tablet every 6 hours as needed for pain 14)  Amlodipine Besylate 10 Mg Tabs (Amlodipine besylate) .... Take 1 tablet by mouth once a day 15)  Hydrochlorothiazide 25 Mg Tabs (Hydrochlorothiazide) .... Take 1 tablet by mouth once a day 16)  Ceron-dm 12.02-04-14 Mg/54ml Syrp (Phenylephrine-chlorphen-dm) .Marland Kitchen.. 1-2 teaspoons every 6-8 hours for cough. 17)  Loratadine 10 Mg Tabs (Loratadine) .... Take 1 tablet by mouth two times a day as needed for allergies   Patient Instructions: 1)  Please contact clinic on Monday 10/2 for appointment. 2)  Please contact [facility] at [phone] for appointment. 3)  Please take all medications as prescribed. 4)  Please go to emergency department if symptoms return/worsen.

## 2010-11-05 NOTE — Progress Notes (Signed)
Summary: prior authorization/gp  Phone Note From Pharmacy   Caller: [facility]  [address] [phone]* Call For: Dr. Threasa Beards  Summary of Call: Prior authorization is needed for Beconase, which is on the non-preferred list.  Unless you want to change it to Fluticasone, Flunisolide which is on the preferred list or to Nasonex per pharmacy..  Thanks Initial call taken by: Chinita Pester RN,  October 31, 2009 12:01 PM  Follow-up for Phone Call        Will change to nasonex. Have sent prescription electronically to her pharmacy. Follow-up by: Jackson Latino MD,  November 01, 2009 7:08 PM    New/Updated Medications: NASONEX 50 MCG/ACT SUSP (MOMETASONE FUROATE) 2 sprays per nostril daily Prescriptions: NASONEX 50 MCG/ACT SUSP (MOMETASONE FUROATE) 2 sprays per nostril daily  #1 x 3   Entered and Authorized by:   Jackson Latino MD   Signed by:   Jackson Latino MD on 11/01/2009   Method used:   Electronically to        [facility] [phone]* (retail)       [address]       [place], [place]  [address]       Ph: [phone]       Fax: [phone]   RxID:   [id number]

## 2010-11-05 NOTE — Letter (Signed)
Summary: FMLA/   FMLA/   Imported By: Margie Billet 06/03/2010 15:11:54  _____________________________________________________________________  External Attachment:    Type:   Image     Comment:   External Document

## 2010-11-05 NOTE — Assessment & Plan Note (Signed)
Summary: FU VISIT/DS   Vital Signs:  Patient profile:   60 year old female Height:      61 inches Weight:      214.9 pounds BMI:     40.75 Temp:     98.3 degrees F oral Pulse rate:   77 / minute BP sitting:   187 / 84  (right arm)  Vitals Entered By: Filomena Jungling NT II (July 02, 2010 4:30 PM) CC: CHECKUP Is Patient Diabetic? Yes Did you bring your meter with you today? No Pain Assessment Patient in pain? yes     Location: HIPS Intensity: 8 Type: aching Onset of pain  Chronic Nutritional Status BMI of > 30 = obese CBG Result 184  Have you ever been in a relationship where you felt threatened, hurt or afraid?No   Does patient need assistance? Functional Status Self care Ambulation Normal   Primary Care Provider:  Blondell Reveal MD  CC:  CHECKUP.  History of Present Illness: 60 yr old lady with PMH as mentioned in the EMR comes to the for follow up of her previous visit..  1. Patient had symptoms of dry cough, hemoptysis and given her history of breast cancer, CT W/CM  to rule out any metastatic lesions in lungs. CT scan was reviewed and was negative for pathology.   2. Patient also underwent blood work which revealed an abnormally elevated TSH. Patient currently denies any symptoms of hypothyroidism.   3. Blood work also revealed anemia. Further history reveals that patient states that she has had hemorrhoids for quite some time and she does occasionally notice some blood in the stool. She states that her last colonoscopy was about 7 yrs ago, when they found 2 polyps and was removed and found to be not malignant.   Denies any other complaints.    Preventive Screening-Counseling & Management  Alcohol-Tobacco     Alcohol drinks/day: <1     Smoking Status: never     Passive Smoke Exposure: yes  Caffeine-Diet-Exercise     Does Patient Exercise: yes     Type of exercise: walk     Times/week: 5  Problems Prior to Update: 1)  Cough  (ICD-786.2) 2)  Pain in  Joint, Ankle and Foot  (ICD-719.47) 3)  Low Back Pain Syndrome  (ICD-724.2) 4)  Diabetes Mellitus, Type II  (ICD-250.00) 5)  Diabetic Peripheral Neuropathy  (ICD-250.60) 6)  Hypertension  (ICD-401.9) 7)  Hyperlipidemia  (ICD-272.4) 8)  Coronary Artery Disease  (ICD-414.00) 9)  Failure, 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 Take 1 Tablet By Mouth Two Times A Day 2)  Lantus 100 Unit/ml Soln (Insulin Glargine) .... Inject 72 Units Once Daily. 3)  Bd Insulin Syringe Microfine 28g X 1/2" 0.5 Ml  Misc (Insulin Syringe-Needle U-100) .... As Directed 4)  Freestyle Test  Strp (Glucose Blood) .... Use To Check Blood Sugar Twice Daily 5)  Lancets 30g  Misc (Lancets) .... Use To Check Blood Sugar Twice A Day 6)  Metoprolol Succinate 100  Mg Xr24h-Tab (Metoprolol Succinate) .... Take 1 Tablet By Mouth Once A Day 7)  Kay Ciel 20 Meq Pack (Potassium Chloride) .... Take 1 Tablet By Mouth Once A Day 8)  Lipitor 40 Mg Tabs (Atorvastatin Calcium) .... Take 1 Tablet By Mouth Once A Day 9)  Aspirin 81 Mg Tbec (Aspirin) .... Take 1 Tablet By Mouth Once A Day 10)  Neurontin 300 Mg Caps (Gabapentin) .... Take 1 Tablet Day 1, Then 1 Tablet Two Times A Day At Day 2, Then Start To Take 1 Tablet By Mouth Three Times A Day After Day 3. 11)  Clonidine Hcl 0.2 Mg Tabs (Clonidine Hcl) .... Take 1 Tablet By Mouth Two Times A Day 12)  Centrum  Tabs (Multiple Vitamins-Minerals) .... Take 1 Tablet By Mouth Once A Day 13)  Ibuprofen 800  Mg Tabs (Ibuprofen) .... Take 1 Tablet Every 6 Hours As Needed For Pain 14)  Amlodipine Besylate 10 Mg Tabs (Amlodipine Besylate) .... Take 1 Tablet By Mouth Once A Day 15)  Hydrochlorothiazide 25 Mg Tabs (Hydrochlorothiazide) .... Take 1 Tablet By Mouth Once A Day 16)  Ceron-Dm 12.02-04-14 Mg/110ml Syrp (Phenylephrine-Chlorphen-Dm) .Marland Kitchen.. 1-2 Teaspoons Every 6-8 Hours For Cough. 17)  Loratadine 10 Mg Tabs (Loratadine) .... Take 1 Tablet By Mouth Two Times A Day As Needed For Allergies 18)  Zithromax Z-Pak 250 Mg  Tabs (Azithromycin) .... As Directed  Current Medications (verified): 1)  Metformin Hcl 1000 Mg Tabs (Metformin Hcl) .... Take 1 Tablet By Mouth Two Times A Day 2)  Lantus 100 Unit/ml Soln (Insulin Glargine) .... Inject 72 Units Once Daily. 3)  Bd Insulin Syringe Microfine 28g X 1/2" 0.5 Ml  Misc (Insulin Syringe-Needle U-100) .... As Directed 4)  Freestyle Test  Strp (Glucose Blood) .... Use To Check Blood Sugar Twice Daily 5)  Lancets 30g  Misc (Lancets) .... Use To Check Blood Sugar Twice A Day 6)  Metoprolol Succinate 100 Mg Xr24h-Tab (Metoprolol Succinate) .... Take 1 Tablet By Mouth Once A Day 7)  Kay Ciel 20 Meq Pack (Potassium Chloride) .... Take 1 Tablet By Mouth Once A Day 8)  Lipitor 40 Mg Tabs (Atorvastatin Calcium) .... Take 1 Tablet By Mouth Once A Day 9)  Aspirin 81 Mg Tbec (Aspirin) .... Take 1 Tablet By Mouth Once A Day 10)  Neurontin 300 Mg Caps (Gabapentin) .... Take 1 Tablet Day 1, Then 1 Tablet Two Times A Day At Day 2, Then Start To Take 1 Tablet By Mouth Three Times A Day After Day 3. 11)  Clonidine Hcl 0.2 Mg Tabs (Clonidine Hcl) .... Take 1 Tablet By Mouth Two Times A Day 12)  Centrum  Tabs (Multiple Vitamins-Minerals) .... Take 1 Tablet By Mouth Once A Day 13)  Ibuprofen 800 Mg Tabs (Ibuprofen) .... Take 1 Tablet Every 6 Hours As Needed For Pain 14)  Amlodipine Besylate 10 Mg Tabs (Amlodipine Besylate) .... Take 1 Tablet By Mouth Once A Day 15)  Hydrochlorothiazide  25 Mg Tabs (Hydrochlorothiazide) .... Take 1 Tablet By Mouth Once A Day 16)  Ceron-Dm 12.02-04-14 Mg/55ml Syrp (Phenylephrine-Chlorphen-Dm) .Marland Kitchen.. 1-2 Teaspoons Every 6-8 Hours For Cough. 17)  Loratadine 10 Mg Tabs (Loratadine) .... Take 1 Tablet By Mouth Two Times A Day As Needed For Allergies  Allergies: 1)  ! Benadryl 2)  ! * Flu Vaccination  Family History: Reviewed history from 04/18/2008 and no changes required. Family History Diabetes 1st degree relative: father passed of its complications at 3 Family History Lung cancer: mother died at  37 Brother died of a PE Sister died of breast CA  Social History: Reviewed history from 01/07/2009 and no changes required. 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   Head:  normocephalic and atraumatic.   Lungs:  normal respiratory effort, no intercostal retractions, no accessory muscle use, and normal breath sounds.   Heart:  normal rate, regular rhythm, no murmur, and no gallop.   Abdomen:  soft, non-tender, normal bowel sounds, and no distention.   Pulses:  R radial normal.   Neurologic:  alert & oriented X3, cranial nerves II-XII intact, and strength normal in all extremities.     Impression & Recommendations:  Problem # 1:  THYROID STIMULATING HORMONE, ABNORMAL (ICD-246.9) Abnormally elevated TSH. Patient currently denies any symptoms of hypothyroidism. Will repeat TSH and free T4. Recommended patient to come to the clinic tomorrow for blood draws.  Orders: T-T4, Free [phone])  Problem # 2:  HYPERTENSION (ICD-401.9) Repeat manual BP is 146/84. No changes made during this visit. Continue current management.  Her updated medication list for this problem includes:    Metoprolol Succinate 100 Mg Xr24h-tab (Metoprolol succinate) .Marland Kitchen... Take 1 tablet by mouth  once a day    Clonidine Hcl 0.2 Mg Tabs (Clonidine hcl) .Marland Kitchen... Take 1 tablet by mouth two times a day    Amlodipine Besylate 10 Mg Tabs (Amlodipine besylate) .Marland Kitchen... Take 1 tablet by mouth once a day    Hydrochlorothiazide 25 Mg Tabs (Hydrochlorothiazide) .Marland Kitchen... Take 1 tablet by mouth once a day  BP today: 187/84 Prior BP: 170/92 (06/23/2010)  Labs Reviewed: K+: 4.1 (12/04/2009) Creat: : 0.80 (12/04/2009)   Chol: 133 (07/22/2009)   HDL: 53 (07/22/2009)   LDL: 64 (07/22/2009)   TG: 78 (07/22/2009)  Problem # 3:  ANEMIA (ICD-285.9)  Patient reports that she has problems with Hemorrhoids and she does notice blood occasionally in her stools. Patient states that she had colonoscopy about 7 years ago, when they found 2 polyps and were removed, found to be non-malignanct. Will schedule for a colonoscopy as an outpatient.  Orders: Gastroenterology Referral (GI)  Complete Medication List: 1)  Metformin Hcl 1000 Mg Tabs (Metformin hcl) .... Take 1 tablet by mouth two times a day 2)  Lantus 100 Unit/ml Soln (Insulin glargine) .... Inject 72 units once daily. 3)  Bd Insulin Syringe Microfine 28g X 1/2" 0.5 Ml Misc (Insulin syringe-needle u-100) .... As directed 4)  Freestyle Test Strp (Glucose blood) .... Use to check blood sugar twice daily 5)  Lancets 30g Misc (Lancets) .... Use to check blood sugar twice a day 6)  Metoprolol Succinate 100 Mg Xr24h-tab (Metoprolol succinate) .... Take 1 tablet by mouth once a day 7)  Kay Ciel 20 Meq Pack (Potassium chloride) .... Take 1 tablet by mouth once a day 8)  Lipitor 40 Mg Tabs (Atorvastatin calcium) .... Take 1 tablet by mouth once a day 9)  Aspirin 81 Mg Tbec (Aspirin) .... Take 1 tablet by mouth once a day 10)  Neurontin 300 Mg Caps (Gabapentin) .... Take 1 tablet day 1, then 1 tablet two times a day at day 2, then start to take 1 tablet by mouth three times a day after day 3. 11)  Clonidine Hcl 0.2 Mg Tabs (Clonidine hcl) .... Take 1 tablet by mouth two  times a day 12)  Centrum Tabs (Multiple vitamins-minerals) .... Take 1 tablet by mouth once a day 13)  Ibuprofen 800 Mg Tabs (Ibuprofen) .... Take 1 tablet every 6 hours as needed for pain 14)  Amlodipine Besylate 10 Mg Tabs (Amlodipine besylate) .... Take 1 tablet by mouth once a day 15)  Hydrochlorothiazide 25 Mg Tabs (Hydrochlorothiazide) .... Take 1 tablet by mouth once a day 16)  Ceron-dm 12.02-04-14 Mg/81ml Syrp (Phenylephrine-chlorphen-dm) .Marland Kitchen.. 1-2 teaspoons every 6-8 hours for cough. 17)  Loratadine 10 Mg Tabs (Loratadine) .... Take 1 tablet by mouth two times a day as needed for allergies  Other Orders: T- Capillary Blood Glucose (16109) T-Hgb A1C (in-house) ([id number]) T-TSH ([id number])  Patient Instructions: 1)  Please schedule a follow-up appointment in 3 months. 2)  Take all the medications as advised below.  Laboratory Results   Blood Tests   Date/Time Received: July 02, 2010 4:52 PM Date/Time Reported: Alric Quan  July 02, 2010 4:52 PM   HGBA1C: 7.1%   (Normal Range: Non-Diabetic - 3-6%   Control Diabetic - 6-8%) CBG Random:: 184mg /dL      Prevention & Chronic Care Immunizations   Influenza vaccine: Not documented   Influenza vaccine deferral: Not indicated  (01/30/2010)    Tetanus booster: 04/10/2009: Td    Pneumococcal vaccine: Not documented   Pneumococcal vaccine deferral: Refused  (03/25/2009)  Colorectal Screening   Hemoccult: Not documented   Hemoccult action/deferral: Not indicated  (01/30/2010)    Colonoscopy: Not documented   Colonoscopy action/deferral: Not indicated  (01/30/2010)   Colonoscopy due: 02/11/2010  Other Screening   Pap smear: Not documented   Pap smear action/deferral: Not indicated S/P hysterectomy  (03/25/2009)    Mammogram: BI-RADS CATEGORY 2:  Benign finding(s).^MM DIGITAL DIAGNOSTIC BILAT  (04/01/2009)   Mammogram due: 01/2009   Smoking status: never  (07/02/2010)  Diabetes Mellitus   HgbA1C: 7.1   (07/02/2010)   HgbA1C action/deferral: Ordered  (04/29/2010)   Hemoglobin A1C due: 10/13/2009    Eye exam: No diabetic retinopathy. OU   Visual acuity OD : 20/50     Visual acuity OS :  20/25    Intraocular pressure OD:  18    Intraocular pressure OS:   18     (02/26/2009)   Eye exam due: 02/26/2010    Foot exam: yes  (04/29/2010)   Foot exam action/deferral: Do today   High risk foot: No  (11/01/2008)   Foot care education: Done  (04/29/2010)   Foot exam due: 03/25/2010    Urine microalbumin/creatinine ratio: 21.6  (04/10/2009)   Urine microalbumin action/deferral: Refused   Urine microalbumin/cr due: 11/01/2009  Lipids   Total Cholesterol: 133  (07/22/2009)   LDL: 64  (07/22/2009)   LDL Direct: Not documented   HDL: 53  (07/22/2009)   Triglycerides: 78  (07/22/2009)   Lipid panel due: 03/25/2010    SGOT (AST): 18  (07/22/2009)   SGPT (ALT): 17  (07/22/2009)   Alkaline phosphatase: 69  (07/22/2009)   Total bilirubin: 0.3  (07/22/2009)  Hypertension   Last Blood Pressure: 187 / 84  (07/02/2010)   Serum creatinine: 0.80  (12/04/2009)   BMP action: Ordered   Serum potassium 4.1  (12/04/2009)  Self-Management Support :   Personal Goals (by the next clinic visit) :     Personal A1C goal: 7  (07/21/2009)     Personal blood pressure goal: 140/90  (07/21/2009)     Personal LDL goal: 70  (07/21/2009)    Patient will work on the following  items until the next clinic visit to reach self-care goals:     Medications and monitoring: take my medicines every day, check my blood sugar  (06/23/2010)     Eating: eat foods that are low in salt, eat baked foods instead of fried foods  (06/23/2010)     Activity: take a 30 minute walk every day  (07/02/2010)     Other: not able to walk like does when cooler weather - coughing makes walking hard at times  (04/29/2010)    Diabetes self-management support: Education handout  (07/02/2010)   Diabetes education handout printed   Last  diabetes self-management training by diabetes educator: 12/18/2009   Last medical nutrition therapy: 12/24/2008    Hypertension self-management support: Education handout  (07/02/2010)   Hypertension education handout printed    Lipid self-management support: Education handout  (07/02/2010)     Lipid education handout printed

## 2010-11-05 NOTE — Assessment & Plan Note (Signed)
Summary: ACUTE-DIABETIC-SEVERE PAIN IN BOTH FEET BURNING/CFB(BOGGALA)   Vital Signs:  Patient profile:   60 year old female Height:      61 inches Weight:      219.6 pounds BMI:     41.64 Temp:     97.5 degrees F oral Pulse rate:   79 / minute BP sitting:   150 / 85  (right arm)  Vitals Entered By: Filomena Jungling NT II (October 28, 2009 8:43 AM) CC: NEED REFILLS  Is Patient Diabetic? Yes Did you bring your meter with you today? Yes Pain Assessment Patient in pain? no      Nutritional Status BMI of > 30 = obese  Have you ever been in a relationship where you felt threatened, hurt or afraid?No   Does patient need assistance? Functional Status Self care Ambulation Normal   Primary Care Provider:  Blondell Reveal MD  CC:  NEED REFILLS .  History of Present Illness: Patient is a 60 yo AAF with PMH of DM, HTN, HLD and obesity who came here for both foot pain and meds refil. She has constant burning pain in both foot for 3 months, nothing makes it better, including tylenol and tramadol. No injury or infection. Her CBG has been poorly controlled and usually higher than 200. Try to lose weight and exercise, but not quite. Denies ETOH or drug abuse. Want to refill all her meds and send to [facility] pharmacy.   Preventive Screening-Counseling & Management  Alcohol-Tobacco     Alcohol drinks/day: <1     Smoking Status: never     Passive Smoke Exposure: yes  Caffeine-Diet-Exercise     Does Patient Exercise: yes     Type of exercise: walk     Times/week: 5  Problems Prior to Update: 1)  Breast Cancer, Hx of  (ICD-V10.3) 2)  Follow-up, Chemotherapy  (ICD-V67.2) 3)  Enlargement of Lymph Nodes  (ICD-785.6) 4)  Blood in Stool, Occult  (ICD-792.1) 5)  Hypertension  (ICD-401.9) 6)  Diabetes Mellitus, Type II  (ICD-250.00) 7)  Hyperlipidemia  (ICD-272.4) 8)  Coronary Artery Disease  (ICD-414.00) 9)  Morbid Obesity  (ICD-278.01) 10)  Unspecified Hypothyroidism  (ICD-244.9) 11)  Low Back  Pain Syndrome  (ICD-724.2) 12)  Heel Pain, Right  (ICD-729.5) 13)  Sleep Apnea, Obstructive, Moderate  (ICD-327.23) 14)  Hip Pain, Right  (ICD-719.45) 15)  Shoulder Pain, Left  (ICD-719.41) 16)  Uri  (ICD-465.9) 17)  Dysuria  (ICD-788.1) 18)  Unspecified Sinusitis  (ICD-473.9) 19)  Chest Pain  (ICD-786.50) 20)  Preventive Health Care  (ICD-V70.0) 21)  Screening Mammogram Nec  (ICD-V76.12) 22)  Symptom, Cough  (ICD-786.2) 23)  Failure, 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 Take 1 Tablet By Mouth Once A Day 2)  Aspirin 81 Mg Tbec (Aspirin) .... Take 1 Tablet By Mouth Once A Day 3)  Zyrtec 10 Mg Tabs (Cetirizine Hcl) .... Take 1 Tablet By Mouth Once A Day As Needed 4)  Lipitor 40 Mg Tabs (Atorvastatin Calcium) .... Take 1 Tablet By Mouth Once A Day 5)  Centrum  Tabs (  Multiple Vitamins-Minerals) .... Take 1 Tablet By Mouth Once A Day 6)  Kay Ciel 20 Meq Pack (Potassium Chloride) .... Take 1 Tablet By Mouth Once A Day 7)  Metformin Hcl 1000 Mg Tabs (Metformin Hcl) .... Take 1 Tablet By Mouth Two Times A Day 8)  Hydrochlorothiazide 25 Mg Tabs (Hydrochlorothiazide) .... Take 1 Tablet By Mouth Once A Day 9)  Toprol Xl 100 Mg  Tb24 (Metoprolol Succinate) .... Take 1 Tablet By Mouth Once A Day 10)  Promethazine Hcl 12.5 Mg  Tabs (Promethazine Hcl) .... Take 1 Tablet By Mouth Every 8 Hours As Needed For Nausea 11)  Bd Insulin Syringe Microfine 28g X 1/2" 0.5 Ml  Misc (Insulin Syringe-Needle U-100) .... As Directed 12)  Alprazolam 0.5 Mg  Tabs (Alprazolam) .... Take 1 Tab By Mouth At  Bedtime 13)  Norvasc 10 Mg Tabs (Amlodipine Besylate) .... Take 1 Tablet By Mouth Once A Day 14)  Tylenol Extra Strength 500 Mg  Tabs (Acetaminophen) .... Take 2 Tablet Three Times Daily As Needed For Pain. 15)  Beconase Aq 42 Mcg/spray Susp (Beclomethasone Diprop Monohyd) .... One To Two Nasal Sprays in Each Nostril Twice A Day 16)  Tramadol Hcl 50 Mg Tabs (Tramadol Hcl) .... Take One Tablet Every Six Hours As Needed 17)  Lantus 100 Unit/ml Soln (Insulin Glargine) .... Inject 28 Units Subcutaneously Once Daily At Bedtime.  Current Medications (verified): 1)  Avapro 300 Mg  Tabs (Irbesartan) .... Take 1 Tablet By Mouth Once A Day 2)  Aspirin 81 Mg Tbec (Aspirin) .... Take 1 Tablet By Mouth Once A Day 3)  Zyrtec 10 Mg Tabs (Cetirizine Hcl) .... Take 1 Tablet By Mouth Once A Day As Needed 4)  Lipitor 40 Mg Tabs (Atorvastatin Calcium) .... Take 1 Tablet By Mouth Once A Day 5)  Centrum  Tabs (Multiple Vitamins-Minerals) .... Take 1 Tablet By Mouth Once A Day 6)  Kay Ciel 20 Meq Pack (Potassium Chloride) .... Take 1 Tablet By Mouth Once A Day 7)  Metformin Hcl 1000 Mg Tabs (Metformin Hcl) .... Take 1 Tablet By Mouth Two Times A Day 8)  Hydrochlorothiazide 25 Mg Tabs (Hydrochlorothiazide) .... Take 1 Tablet By Mouth Once A Day 9)  Toprol Xl 100 Mg  Tb24 (Metoprolol Succinate) .... Take 1 Tablet By Mouth Once A Day 10)  Promethazine Hcl 12.5 Mg  Tabs (Promethazine Hcl) .... Take 1 Tablet By Mouth Every 8 Hours As Needed For Nausea 11)  Bd Insulin Syringe Microfine 28g X 1/2" 0.5 Ml  Misc (Insulin Syringe-Needle U-100) .... As Directed 12)  Alprazolam 0.5 Mg  Tabs (Alprazolam) .... Take 1 Tab By Mouth At Bedtime 13)  Norvasc 10 Mg Tabs (Amlodipine Besylate) .... Take 1 Tablet By Mouth Once A Day 14)  Tylenol Extra Strength 500 Mg  Tabs (Acetaminophen) .... Take 2 Tablet Three Times Daily As Needed For Pain. 15)  Beconase Aq 42 Mcg/spray Susp (Beclomethasone Diprop Monohyd) .... One To Two Nasal Sprays  in Each Nostril Twice A Day 16)  Tramadol Hcl 50 Mg Tabs (Tramadol Hcl) .... Take One Tablet Every Six Hours As Needed 17)  Lantus 100 Unit/ml Soln (Insulin Glargine) .... Inject 28 Units Subcutaneously Once Daily At Bedtime.  Allergies (verified): 1)  ! Benadryl 2)  ! * Flu Vaccination  Past History:  Past Medical History: Last updated: 09/14/2007 Breast cancer, hx of, left 2006 (invasive papillary CA, ductal CA in situ) Cardiac cath 11/07 showed no ischemia, no significant disease. EF 55% by  ECHO 11/07 Diabetes mellitus, type II Hypertension GERD Hyperlipidemia Obesity H/o colon polyp seen on Colonoscopy by Dr. Madilyn Fireman 1995(per pt) Repeat Colonoscopy by Dr. ZOXW(96/04)                                   Isolated diverticulum in mid transverse colon.                                    Small non bleeding internal hemorrhoids                                   Repeat Colonoscopy every 5 years. Moderate OSA on sleep study(02/16/07)  Past Surgical History: Last updated: 04/18/2008 S/P surgery and chemotherapy for lt breast cancer. colon polypectomy -2004. hysterectomy 1985.  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 Family History Diabetes 1st degree relative: father passed of its complications at 34 Family History Lung cancer: mother died at 64 Brother died of a PE Sister died of breast CA  Social  History: Reviewed history from 01/07/2009 and no changes required. 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  The patient denies fever, vision loss, peripheral edema, prolonged cough, headaches, abdominal pain, melena, incontinence, and abnormal bleeding.    Physical Exam  General:  alert, well-developed, well-nourished, well-hydrated, and overweight-appearing.   Head:  normocephalic.   Eyes:  vision grossly intact.   Ears:  no external deformities.   Nose:  no nasal discharge.   Mouth:  pharynx pink and moist.   Neck:  supple.   Chest Wall:  no deformities.   Lungs:  normal respiratory effort, normal breath sounds, no dullness, no crackles, and no wheezes.   Heart:  normal rate, regular rhythm, no murmur, no gallop, and no JVD.   Abdomen:  soft, non-tender, normal bowel sounds, no distention, and no masses.   Msk:  normal ROM, no joint tenderness, no joint swelling, no joint warmth, and no redness over joints.   Pulses:  2+ Extremities:  No edema. Neurologic:  alert & oriented X3, cranial nerves II-XII intact, strength normal in all extremities, sensation intact to light touch, and gait normal.     Impression & Recommendations:  Problem # 1:  DIABETES MELLITUS, TYPE II (ICD-250.00) Assessment Unchanged Still poorly controlled and her CBG usually above 200 and has not started lantus. now she has insurance and will start lantus and recheck in 3 months. Encouraged weight loss, exercise and healthy diet. Her updated medication list for this problem includes:    Avapro 300 Mg Tabs (Irbesartan) .Marland Kitchen... Take 1 tablet by mouth once a day    Aspirin 81 Mg Tbec (Aspirin) .Marland KitchenMarland KitchenMarland KitchenMarland Kitchen  Take 1 tablet by mouth once a day    Metformin Hcl 1000 Mg Tabs (Metformin hcl) .Marland Kitchen... Take 1 tablet by mouth two times a day    Lantus 100 Unit/ml Soln (Insulin glargine) ..... Inject  28 units subcutaneously once daily at bedtime.  Orders: T- Capillary Blood Glucose (82948) T-Hgb A1C (in-house) (82956OZ)  Labs Reviewed: Creat: 1.03 (07/22/2009)     Last Eye Exam: No diabetic retinopathy. OU   Visual acuity OD : 20/50     Visual acuity OS :  20/25    Intraocular pressure OD:  18    Intraocular pressure OS:   18    (02/26/2009) Reviewed HgBA1c results: 9.3 (10/28/2009)  9.0 (07/21/2009)  Problem # 2:  HYPERTENSION (ICD-401.9) Assessment: Improved Her BP improved since adding HCTZ and advised weight loss, exercise and restriction of salt.  Her updated medication list for this problem includes:    Avapro 300 Mg Tabs (Irbesartan) .Marland Kitchen... Take 1 tablet by mouth once a day    Hydrochlorothiazide 25 Mg Tabs (Hydrochlorothiazide) .Marland Kitchen... Take 1 tablet by mouth once a day    Toprol Xl 100 Mg Tb24 (Metoprolol succinate) .Marland Kitchen... Take 1 tablet by mouth once a day    Norvasc 10 Mg Tabs (Amlodipine besylate) .Marland Kitchen... Take 1 tablet by mouth once a day  BP today: 150/85 Prior BP: 178/91 (09/12/2009)  Labs Reviewed: K+: 4.1 (07/22/2009) Creat: : 1.03 (07/22/2009)   Chol: 133 (07/22/2009)   HDL: 53 (07/22/2009)   LDL: 64 (07/22/2009)   TG: 78 (07/22/2009)  Problem # 3:  DIABETIC PERIPHERAL NEUROPATHY (ICD-250.60) Assessment: New  Her foot pain is likely dibetic neuropathy and will start neurontin and follow up response.  Her updated medication list for this problem includes:    Avapro 300 Mg Tabs (Irbesartan) .Marland Kitchen... Take 1 tablet by mouth once a day    Aspirin 81 Mg Tbec (Aspirin) .Marland Kitchen... Take 1 tablet by mouth once a day    Metformin Hcl 1000 Mg Tabs (Metformin hcl) .Marland Kitchen... Take 1 tablet by mouth two times a day    Lantus 100 Unit/ml Soln (Insulin glargine) ..... Inject 28 units subcutaneously once daily at bedtime.  Labs Reviewed: Creat: 1.03 (07/22/2009)     Last Eye Exam: No diabetic retinopathy. OU   Visual acuity OD : 20/50     Visual acuity OS :  20/25    Intraocular  pressure OD:  18    Intraocular pressure OS:   18    (02/26/2009) Reviewed HgBA1c results: 9.3 (10/28/2009)  9.0 (07/21/2009)  Problem # 4:  MORBID OBESITY (ICD-278.01) Assessment: Unchanged Advised exercise and weight loss as wellas healthy diet, she would like to try.  Ht: 61 (10/28/2009)   Wt: 219.6 (10/28/2009)   BMI: 41.64 (10/28/2009)  Complete Medication List: 1)  Avapro 300 Mg Tabs (Irbesartan) .... Take 1 tablet by mouth once a day 2)  Aspirin 81 Mg Tbec (Aspirin) .... Take 1 tablet by mouth once a day 3)  Zyrtec 10 Mg Tabs (Cetirizine hcl) .... Take 1 tablet by mouth once a day as needed 4)  Lipitor 40 Mg Tabs (Atorvastatin calcium) .... Take 1 tablet by mouth once a day 5)  Centrum Tabs (Multiple vitamins-minerals) .... Take 1 tablet by mouth once a day 6)  Kay Ciel 20 Meq Pack (Potassium chloride) .... Take 1 tablet by mouth once a day 7)  Metformin Hcl 1000 Mg Tabs (Metformin hcl) .... Take 1 tablet by mouth two times a day 8)  Hydrochlorothiazide  25 Mg Tabs (Hydrochlorothiazide) .... Take 1 tablet by mouth once a day 9)  Toprol Xl 100 Mg Tb24 (Metoprolol succinate) .... Take 1 tablet by mouth once a day 10)  Promethazine Hcl 12.5 Mg Tabs (Promethazine hcl) .... Take 1 tablet by mouth every 8 hours as needed for nausea 11)  Bd Insulin Syringe Microfine 28g X 1/2" 0.5 Ml Misc (Insulin syringe-needle u-100) .... As directed 12)  Alprazolam 0.5 Mg Tabs (Alprazolam) .... Take 1 tab by mouth at bedtime 13)  Norvasc 10 Mg Tabs (Amlodipine besylate) .... Take 1 tablet by mouth once a day 14)  Tylenol Extra Strength 500 Mg Tabs (Acetaminophen) .... Take 2 tablet three times daily as needed for pain. 15)  Beconase Aq 42 Mcg/spray Susp (Beclomethasone diprop monohyd) .... One to two nasal sprays in each nostril twice a day 16)  Lantus 100 Unit/ml Soln (Insulin glargine) .... Inject 28 units subcutaneously once daily at bedtime. 17)  Neurontin 300 Mg Caps (Gabapentin) .... Take 1 tablet  day 1, then 1 tablet two times a day at day 2, then start to take 1 tablet by mouth three times a day after day 3.  Patient Instructions: 1)  Please schedule a follow-up appointment in 3 months. 2)  It is important that you exercise regularly at least 20 minutes 5 times a week. If you develop chest pain, have severe difficulty breathing, or feel very tired , stop exercising immediately and seek medical attention. 3)  You need to lose weight. Consider a lower calorie diet and regular exercise.  4)  Check your blood sugars regularly. If your readings are usually above : or below 70 you should contact our office. 5)  Check your feet each night for sore areas, calluses or signs of infection. Prescriptions: NEURONTIN 300 MG CAPS (GABAPENTIN) Take 1 tablet day 1, then 1 tablet two times a day at day 2, then start to Take 1 tablet by mouth three times a day after day 3.  #100 x 2   Entered and Authorized by:   Jackson Latino MD   Signed by:   Jackson Latino MD on 10/28/2009   Method used:   Electronically to        [facility] [phone]* (retail)       [address]       [place], [place]  [address]       Ph: [phone]       Fax: [phone]   RxID:   [id number] LANTUS 100 UNIT/ML SOLN (INSULIN GLARGINE) Inject 28 units subcutaneously once daily at bedtime.  #4 x 3   Entered and Authorized by:   Jackson Latino MD   Signed by:   Jackson Latino MD on 10/28/2009   Method used:   Electronically to        [facility] [phone]* (retail)       [address]       [place], [place]  [address]       Ph: [phone]       Fax: [phone]   RxID:   [id number] BECONASE AQ 42 MCG/SPRAY SUSP (BECLOMETHASONE DIPROP MONOHYD) one to two nasal sprays in each nostril twice a day  #1 x 6   Entered and Authorized by:   Jackson Latino MD   Signed by:   Jackson Latino MD on 10/28/2009   Method used:   Electronically to  [facility]   [address] [phone]* (retail)       [address]       [place], [place]  [address]       Ph: [phone]       Fax: [phone]   RxID:   [id number] NORVASC 10 MG TABS (AMLODIPINE BESYLATE) Take 1 tablet by mouth once a day  #93 x 3   Entered and Authorized by:   Jackson Latino MD   Signed by:   Jackson Latino MD on 10/28/2009   Method used:   Electronically to        [facility] [phone]* (retail)       [address]       [place], [place]  [address]       Ph: [phone]       Fax: [phone]   RxID:   [id number] BD INSULIN SYRINGE MICROFINE 28G X 1/2" 0.5 ML  MISC (INSULIN SYRINGE-NEEDLE U-100) as directed  #200 x 6   Entered and Authorized by:   Jackson Latino MD   Signed by:   Jackson Latino MD on 10/28/2009   Method used:   Electronically to        [facility] [phone]* (retail)       [address]       [place], [place]  [address]       Ph: [phone]       Fax: [phone]   RxID:   [id number] PROMETHAZINE HCL 12.5 MG  TABS (PROMETHAZINE HCL) Take 1 tablet by mouth every 8 hours as needed for nausea  #60 x 0   Entered and Authorized by:   Jackson Latino MD   Signed by:   Jackson Latino MD on 10/28/2009   Method used:   Electronically to        [facility] [phone]* (retail)       [address]       [place], [place]  [address]       Ph: [phone]       Fax: [phone]   RxID:   [id number] TOPROL XL 100 MG  TB24 (METOPROLOL SUCCINATE) Take 1 tablet by mouth once a day  #93 x 3   Entered and Authorized by:   Jackson Latino MD   Signed by:   Jackson Latino MD on 10/28/2009   Method used:   Electronically to        [facility] [phone]* (retail)       [address]       [place], [place]  [address]       Ph: [phone]       Fax: [phone]   RxID:   [id number] HYDROCHLOROTHIAZIDE 25 MG TABS  (HYDROCHLOROTHIAZIDE) Take 1 tablet by mouth once a day  #93 x 4   Entered and Authorized by:   Jackson Latino MD   Signed by:   Jackson Latino MD on 10/28/2009   Method used:   Electronically to        [facility] [phone]* (retail)       [address]       [place], [place]  [address]  Ph: [phone]       Fax: [phone]   RxID:   [id number] METFORMIN HCL 1000 MG TABS (METFORMIN HCL) take 1 tablet by mouth two times a day  #200 x 2   Entered and Authorized by:   Jackson Latino MD   Signed by:   Jackson Latino MD on 10/28/2009   Method used:   Electronically to        [facility] [phone]* (retail)       [address]       [place], [place]  [address]       Ph: [phone]       Fax: [phone]   RxID:   [id number] KAY CIEL 20 MEQ PACK (POTASSIUM CHLORIDE) Take 1 tablet by mouth once a day  #93 x 3   Entered and Authorized by:   Jackson Latino MD   Signed by:   Jackson Latino MD on 10/28/2009   Method used:   Electronically to        [facility] [phone]* (retail)       [address]       [place], [place]  [address]       Ph: [phone]       Fax: [phone]   RxID:   [id number] LIPITOR 40 MG TABS (ATORVASTATIN CALCIUM) Take 1 tablet by mouth once a day  #93 x 4   Entered and Authorized by:   Jackson Latino MD   Signed by:   Jackson Latino MD on 10/28/2009   Method used:   Electronically to        [facility] [phone]* (retail)       [address]       [place], [place]  [address]       Ph: [phone]       Fax: [phone]   RxID:   [id number] ASPIRIN 81 MG TBEC (ASPIRIN) Take 1 tablet by mouth once a day  #93 x 3   Entered and Authorized by:   Jackson Latino MD   Signed by:   Jackson Latino MD on 10/28/2009   Method used:   Electronically to        [facility] [phone]* (retail)       [address]        [place], [place]  [address]       Ph: [phone]       Fax: [phone]   RxID:   [id number] AVAPRO 300 MG  TABS (IRBESARTAN) Take 1 tablet by mouth once a day  #93 x 4   Entered and Authorized by:   Jackson Latino MD   Signed by:   Jackson Latino MD on 10/28/2009   Method used:   Electronically to        [facility] [phone]* (retail)       [address]       [place], [place]  [address]       Ph: [phone]       Fax: [phone]   RxID:   [id number]     Prevention & Chronic Care Immunizations   Influenza vaccine: Not documented   Influenza vaccine deferral: Refused  (10/28/2009)    Tetanus booster: 04/10/2009: Td  Pneumococcal vaccine: Not documented   Pneumococcal vaccine deferral: Refused  (03/25/2009)  Colorectal Screening   Hemoccult: Not documented   Hemoccult action/deferral: Ordered  (03/25/2009)    Colonoscopy: Not documented   Colonoscopy action/deferral: GI referral  (09/12/2009)  Other Screening   Pap smear: Not documented   Pap smear action/deferral: Not indicated S/P hysterectomy  (03/25/2009)    Mammogram: BI-RADS CATEGORY 2:  Benign finding(s).^MM DIGITAL DIAGNOSTIC BILAT  (04/01/2009)   Mammogram due: 01/2009   Smoking status: never  (10/28/2009)  Diabetes Mellitus   HgbA1C: 9.3  (10/28/2009)   Hemoglobin A1C due: 10/13/2009    Eye exam: No diabetic retinopathy. OU   Visual acuity OD : 20/50     Visual acuity OS :  20/25    Intraocular pressure OD:  18    Intraocular pressure OS:   18     (02/26/2009)   Eye exam due: 02/26/2010    Foot exam: yes  (04/10/2009)   High risk foot: No  (11/01/2008)   Foot care education: Not documented   Foot exam due: 03/25/2010    Urine microalbumin/creatinine ratio: 21.6  (04/10/2009)   Urine microalbumin/cr due: 11/01/2009    Diabetes flowsheet reviewed?: Yes   Progress toward A1C goal: Unchanged  Lipids   Total Cholesterol: 133   (07/22/2009)   LDL: 64  (07/22/2009)   LDL Direct: Not documented   HDL: 53  (07/22/2009)   Triglycerides: 78  (07/22/2009)   Lipid panel due: 03/25/2010    SGOT (AST): 18  (07/22/2009)   SGPT (ALT): 17  (07/22/2009)   Alkaline phosphatase: 69  (07/22/2009)   Total bilirubin: 0.3  (07/22/2009)    Lipid flowsheet reviewed?: Yes   Progress toward LDL goal: At goal  Hypertension   Last Blood Pressure: 150 / 85  (10/28/2009)   Serum creatinine: 1.03  (07/22/2009)   BMP action: Ordered   Serum potassium 4.1  (07/22/2009)    Hypertension flowsheet reviewed?: Yes   Progress toward BP goal: Improved  Self-Management Support :   Personal Goals (by the next clinic visit) :     Personal A1C goal: 7  (07/21/2009)     Personal blood pressure goal: 140/90  (07/21/2009)     Personal LDL goal: 70  (07/21/2009)    Patient will work on the following items until the next clinic visit to reach self-care goals:     Medications and monitoring: take my medicines every day, check my blood sugar, bring all of my medications to every visit, examine my feet every day  (10/28/2009)     Eating: drink diet soda or water instead of juice or soda, eat more vegetables, use fresh or frozen vegetables, eat foods that are low in salt, eat baked foods instead of fried foods, eat fruit for snacks and desserts, limit or avoid alcohol  (10/28/2009)     Activity: take a 30 minute walk every day  (10/28/2009)    Diabetes self-management support: Copy of home glucose meter record, Written self-care plan  (10/28/2009)   Diabetes care plan printed   Last diabetes self-management training by diabetes educator: 12/24/2008   Last medical nutrition therapy: 12/24/2008    Hypertension self-management support: Written self-care plan  (10/28/2009)   Hypertension self-care plan printed.    Lipid self-management support: Written self-care plan  (10/28/2009)   Lipid self-care plan printed.  Laboratory Results   Blood  Tests   Date/Time Received: October 28, 2009 9:04 AM Date/Time Reported: Alric Quan  October 28, 2009 9:04 AM   HGBA1C: 9.3%   (Normal Range: Non-Diabetic - 3-6%   Control Diabetic - 6-8%) CBG Fasting:: 192mg /dL

## 2010-11-05 NOTE — Assessment & Plan Note (Signed)
Summary: [name]   Vital Signs:  Patient profile:   60 year old female Height:      61 inches (154.94 cm) Weight:      213.4 pounds (97 kg) BMI:     40.47 Temp:     97.1 degrees F (36.17 degrees C) oral Pulse rate:   83 / minute BP sitting:   170 / 92  (right arm) Cuff size:   large  Vitals Entered By: Cynda Familia Duncan Dull) (June 23, 2010 3:46 PM) CC: Pt states she has been coughing since last visit 04/29/10-worse at night, thinks she has bronchitis, foul smelling urine Nutritional Status BMI of > 30 = obese  Have you ever been in a relationship where you felt threatened, hurt or afraid?No   Does patient need assistance? Functional Status Self care Ambulation Normal   Primary Care Mieshia Pepitone:  Blondell Reveal MD  CC:  Pt states she has been coughing since last visit 04/29/10-worse at night, thinks she has bronchitis, and foul smelling urine.  History of Present Illness: 31 allergy has turn into bronchitis, dry hacking cough , sometimes clear slimy thick yellow or green mucous that chokes her.  cough worse at night and hurts when she coughs or takes a deep breath.  Denies any blood in sputum or fever.  This has been going on since end of July, moved out of her apartment however it had mildew and mold and she is allergic to it. She is wheezing, feel like something in the back of her throat, loose her voice.   +nausea and vomiting daily without blood.  Had watery diarrhea 4x on Sunday.   SHe had chest pain last night and SOB.  She goes to the window to get fresh air.   Been sweating at night, uses fan on at night as well as during the day She is on Lasix 40mg  two times a day for her heart failure.   She checks her BP at home: 240/118     Allergies: 1)  ! Benadryl 2)  ! * Flu Vaccination  Review of Systems       +nausea , vomiting, abdominal pain  Physical Exam  General:  alert, well-developed, and well-nourished.   Head:  Thyroid  glands- boggy Ears:  R ear normal and L ear normal.  normal TM, no erythema or discharge, no tenderness Nose:  no nasal discharge or erythema Mouth:  pharynx pink and moist Lungs:  normal respiratory effort, no intercostal retractions, no accessory muscle use, normal breath sounds, no dullness, no fremitus, no crackles, and no wheezes.  normal respiratory effort, no intercostal retractions, no accessory muscle use, normal breath sounds, no dullness, no fremitus, no crackles, and no wheezes.   Heart:  normal rate, regular rhythm, no murmur, no gallop, no rub, and no JVD.   Abdomen:  soft, normal bowel sounds, no distention, no masses, no guarding, and no rigidity.   Pulses:  +2DP Extremities:  +2 nonpitting edema Neurologic:  alert & oriented X3 and cranial nerves II-XII intact.     Impression & Recommendations:  Problem # 1:  COUGH (ICD-786.2) Chronic cough x 2 months.  Could be bacterial infection given her long course of cough, with green phlegm, but it could also be other underlying problems such as mets given that she had radiation for her left breast cancer and now sweating at night.  She also has some hoarseness which could be due to Chlamydial infection.  She is  at risk for atypical organisms so we will cover with macrolide antibiotics.  Will do work-up for chronic cough.  Patient had a chest Xray in 03/2008 showed no active disease.    WIll get CT chest with contrast and start Z-pack, check CBC, CMET, TSH.   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  She is having bilateral lower extremities edema and is currently taking Lasix 40mg  two times a day. Will continue current regimen right now until CT chest results.  Her updated medication list for this problem includes:    Metoprolol  Succinate 100 Mg Xr24h-tab (Metoprolol succinate) .Marland Kitchen... Take 1 tablet by mouth once a day    Aspirin 81 Mg Tbec (Aspirin) .Marland Kitchen... Take 1 tablet by mouth once a day    Hydrochlorothiazide 25 Mg Tabs (Hydrochlorothiazide) .Marland Kitchen... Take 1 tablet by mouth once a day  Problem # 3:  HYPERTENSION (ICD-401.9) Patient reports extremely elevated BP whenever she coughs, she is currently on 4 different HTN meds at home and still elevated.  I will not adjust her BP today and take care of her cough first because she has an appointment to see Dr. Brooke Pace on 07/02/10.  He can readjust her BP meds at next visit.  However, I advised her to come to clinic or ER if she has any visual disturbance, neurological symptoms, or decrease in urine output with elevated BP (patient reports of checking her BP at home).   Her updated medication list for this problem includes:    Metoprolol Succinate 100 Mg Xr24h-tab (Metoprolol succinate) .Marland Kitchen... Take 1 tablet by mouth once a day    Clonidine Hcl 0.2 Mg Tabs (Clonidine hcl) .Marland Kitchen... Take 1 tablet by mouth two times a day    Amlodipine Besylate 10 Mg Tabs (Amlodipine besylate) .Marland Kitchen... Take 1 tablet by mouth once a day    Hydrochlorothiazide 25 Mg Tabs (Hydrochlorothiazide) .Marland Kitchen... Take 1 tablet by mouth once a day  Orders: T-TSH ([id number]) T-Urinalysis ([id number])  Complete Medication List: 1)  Metformin Hcl 1000 Mg Tabs (Metformin hcl) .... Take 1 tablet by mouth two times a day 2)  Lantus 100 Unit/ml Soln (Insulin glargine) .... Inject 72 units once daily. 3)  Bd Insulin Syringe Microfine 28g X 1/2" 0.5 Ml Misc (Insulin syringe-needle u-100) .... As directed 4)  Freestyle Test Strp (Glucose blood) .... Use to check blood sugar twice daily 5)  Lancets 30g Misc (Lancets) .... Use to check blood sugar twice a day 6)  Metoprolol Succinate 100 Mg Xr24h-tab (Metoprolol succinate) .... Take 1 tablet by mouth once a day 7)  Kay Ciel 20 Meq Pack (Potassium chloride) .... Take 1 tablet by  mouth once a day 8)  Lipitor 40 Mg Tabs (Atorvastatin calcium) .... Take 1 tablet by mouth once a day 9)  Aspirin 81 Mg Tbec (Aspirin) .... Take 1 tablet by mouth once a day 10)  Neurontin 300 Mg Caps (Gabapentin) .... Take 1 tablet day 1, then 1 tablet two times a day at day 2, then start to take 1 tablet by mouth three times a day after day 3. 11)  Clonidine Hcl 0.2 Mg Tabs (Clonidine hcl) .... Take 1 tablet by mouth two times a day 12)  Centrum Tabs (Multiple vitamins-minerals) .... Take 1 tablet by mouth once a day 13)  Ibuprofen 800 Mg  Tabs (Ibuprofen) .... Take 1 tablet every 6 hours as needed for pain 14)  Amlodipine Besylate 10 Mg Tabs (Amlodipine besylate) .... Take 1 tablet by mouth once a day 15)  Hydrochlorothiazide 25 Mg Tabs (Hydrochlorothiazide) .... Take 1 tablet by mouth once a day 16)  Ceron-dm 12.02-04-14 Mg/86ml Syrp (Phenylephrine-chlorphen-dm) .Marland Kitchen.. 1-2 teaspoons every 6-8 hours for cough. 17)  Loratadine 10 Mg Tabs (Loratadine) .... Take 1 tablet by mouth two times a day as needed for allergies 18)  Zithromax Z-pak 250 Mg Tabs (Azithromycin) .... As directed  Other Orders: T-Urinalysis Dipstick only ([id number])  Patient Instructions: 1)  Get Chest CT scan 2)  Bloodwork today 3)  Take Z-pack antibiotics as directed 4)  Follow up with Dr. Brooke Pace in 2 weeks 5)  Take Zyrtec or allergy medication over the counter Prescriptions: ZITHROMAX Z-PAK 250 MG  TABS (AZITHROMYCIN) As directed  #1 x 0   Entered and Authorized by:   Rosana Berger MD   Signed by:   Rosana Berger MD on 06/23/2010   Method used:   Electronically to        [facility] [id number]* (retail)       [address]       [place], [place]  [address]       Ph: [phone]       Fax: [phone]   RxID:   [id number]  Process Orders Check Orders Results:     [facility]: ABN not required for this insurance Tests Sent for requisitioning (June 23, 2010 5:24 PM):      06/23/2010: [facility] -- T-CMP with Estimated GFR [[id number]] (signed)     06/23/2010: [facility] -- T-CBC w/Diff [[id number]] (signed)     06/23/2010: [facility] -- T-TSH [phone] (signed)     06/23/2010: [facility] -- T-Urinalysis [[id number]] (signed)     Prevention & Chronic Care Immunizations   Influenza vaccine: Not documented   Influenza vaccine deferral: Not indicated  (01/30/2010)    Tetanus booster: 04/10/2009: Td    Pneumococcal vaccine: Not documented   Pneumococcal vaccine deferral: Refused  (03/25/2009)  Colorectal Screening   Hemoccult: Not documented   Hemoccult action/deferral: Not indicated  (01/30/2010)    Colonoscopy: Not documented   Colonoscopy action/deferral: Not indicated  (01/30/2010)   Colonoscopy due: 02/11/2010  Other Screening   Pap smear: Not documented   Pap smear action/deferral: Not indicated S/P hysterectomy  (03/25/2009)    Mammogram: BI-RADS CATEGORY 2:  Benign finding(s).^MM DIGITAL DIAGNOSTIC BILAT  (04/01/2009)   Mammogram due: 01/2009   Smoking status: never  (04/29/2010)  Diabetes Mellitus   HgbA1C: 7.5  (04/29/2010)   HgbA1C action/deferral: Ordered  (04/29/2010)   Hemoglobin A1C due: 10/13/2009    Eye exam: No diabetic retinopathy. OU   Visual acuity OD : 20/50     Visual acuity OS :  20/25    Intraocular pressure OD:  18    Intraocular pressure OS:   18     (02/26/2009)   Eye exam due: 02/26/2010    Foot exam: yes  (04/29/2010)   Foot exam action/deferral: Do today   High risk foot: No  (11/01/2008)   Foot care education: Done  (04/29/2010)   Foot exam due: 03/25/2010    Urine microalbumin/creatinine ratio: 21.6  (04/10/2009)   Urine microalbumin action/deferral: Refused   Urine microalbumin/cr due: 11/01/2009  Lipids   Total Cholesterol: 133  (07/22/2009)  LDL: 64  (07/22/2009)   LDL Direct: Not documented   HDL: 53   (07/22/2009)   Triglycerides: 78  (07/22/2009)   Lipid panel due: 03/25/2010    SGOT (AST): 18  (07/22/2009)   SGPT (ALT): 17  (07/22/2009)   Alkaline phosphatase: 69  (07/22/2009)   Total bilirubin: 0.3  (07/22/2009)  Hypertension   Last Blood Pressure: 170 / 92  (06/23/2010)   Serum creatinine: 0.80  (12/04/2009)   BMP action: Ordered   Serum potassium 4.1  (12/04/2009)  Self-Management Support :   Personal Goals (by the next clinic visit) :     Personal A1C goal: 7  (07/21/2009)     Personal blood pressure goal: 140/90  (07/21/2009)     Personal LDL goal: 70  (07/21/2009)    Patient will work on the following items until the next clinic visit to reach self-care goals:     Medications and monitoring: take my medicines every day, check my blood sugar  (06/23/2010)     Eating: eat foods that are low in salt, eat baked foods instead of fried foods  (06/23/2010)     Activity: take a 30 minute walk every day  (04/29/2010)     Other: not able to walk like does when cooler weather - coughing makes walking hard at times  (04/29/2010)    Diabetes self-management support: Written self-care plan  (06/23/2010)   Diabetes care plan printed   Last diabetes self-management training by diabetes educator: 12/18/2009   Last medical nutrition therapy: 12/24/2008    Hypertension self-management support: Written self-care plan  (06/23/2010)   Hypertension self-care plan printed.    Lipid self-management support: Written self-care plan  (06/23/2010)   Lipid self-care plan printed.     Laboratory Results   Urine Tests  Date/Time Received: .Cynda Familia [facility])  June 23, 2010 4:16 PM  Date/Time Reported: .Cynda Familia [facility])  June 23, 2010 4:17 PM   Routine Urinalysis   Color: yellow Appearance: Hazy Glucose: negative   (Normal Range: Negative) Bilirubin: small   (Normal Range: Negative) Ketone: trace (5)   (Normal Range: Negative) Spec. Gravity: >=1.030   (Normal  Range: 1.003-1.035) Blood: negative   (Normal Range: Negative) pH: 5.0   (Normal Range: 5.0-8.0) Protein: negative   (Normal Range: Negative) Urobilinogen: 0.2   (Normal Range: 0-1) Nitrite: negative   (Normal Range: Negative) Leukocyte Esterace: negative   (Normal Range: Negative)

## 2010-11-09 ENCOUNTER — Other Ambulatory Visit: Payer: Self-pay | Admitting: *Deleted

## 2010-11-09 NOTE — Telephone Encounter (Deleted)
Refill approved - nurse to complete. 

## 2010-11-09 NOTE — Telephone Encounter (Signed)
Health dept would like to change to simvastatin or pravastatin instead of lipitor Also do you want Kcl tablets or the pack that was ordered?

## 2010-11-09 NOTE — Telephone Encounter (Signed)
Patient has a follow-up appointment in 2 days; I will defer decision on the change in statin medication, and the dosage form of the potassium chloride, to the provider who sees her on that visit.

## 2010-11-11 ENCOUNTER — Encounter: Payer: Self-pay | Admitting: Internal Medicine

## 2010-11-11 ENCOUNTER — Ambulatory Visit (INDEPENDENT_AMBULATORY_CARE_PROVIDER_SITE_OTHER): Payer: Self-pay | Admitting: Internal Medicine

## 2010-11-11 ENCOUNTER — Other Ambulatory Visit: Payer: Self-pay | Admitting: Internal Medicine

## 2010-11-11 DIAGNOSIS — D509 Iron deficiency anemia, unspecified: Secondary | ICD-10-CM

## 2010-11-11 DIAGNOSIS — I10 Essential (primary) hypertension: Secondary | ICD-10-CM

## 2010-11-11 DIAGNOSIS — D649 Anemia, unspecified: Secondary | ICD-10-CM

## 2010-11-11 DIAGNOSIS — E039 Hypothyroidism, unspecified: Secondary | ICD-10-CM

## 2010-11-11 DIAGNOSIS — K219 Gastro-esophageal reflux disease without esophagitis: Secondary | ICD-10-CM

## 2010-11-11 DIAGNOSIS — E785 Hyperlipidemia, unspecified: Secondary | ICD-10-CM

## 2010-11-11 DIAGNOSIS — I5040 Unspecified combined systolic (congestive) and diastolic (congestive) heart failure: Secondary | ICD-10-CM

## 2010-11-11 DIAGNOSIS — E119 Type 2 diabetes mellitus without complications: Secondary | ICD-10-CM

## 2010-11-11 LAB — GLUCOSE, POCT (MANUAL RESULT ENTRY): POC Glucose: 107

## 2010-11-11 LAB — GLUCOSE, CAPILLARY: Glucose-Capillary: 107 mg/dL — ABNORMAL HIGH (ref 70–99)

## 2010-11-11 MED ORDER — HYDROCHLOROTHIAZIDE 25 MG PO TABS: 25.0000 mg | ORAL_TABLET | Freq: Every day | ORAL | Status: DC

## 2010-11-11 MED ORDER — LOSARTAN POTASSIUM 50 MG PO TABS: 50.0000 mg | ORAL_TABLET | Freq: Every day | ORAL | Status: DC

## 2010-11-11 MED ORDER — CLONIDINE HCL 0.2 MG PO TABS: 0.2000 mg | ORAL_TABLET | Freq: Two times a day (BID) | ORAL | Status: DC

## 2010-11-11 MED ORDER — GLUCOSE BLOOD VI STRP: ORAL_STRIP | Status: DC

## 2010-11-11 MED ORDER — AMLODIPINE BESYLATE 10 MG PO TABS: 10.0000 mg | ORAL_TABLET | Freq: Every day | ORAL | Status: DC

## 2010-11-11 MED ORDER — METOPROLOL SUCCINATE ER 100 MG PO TB24: 100.0000 mg | ORAL_TABLET | Freq: Every day | ORAL | Status: DC

## 2010-11-11 MED ORDER — POTASSIUM CHLORIDE CRYS ER 20 MEQ PO TBCR: 20.0000 meq | EXTENDED_RELEASE_TABLET | Freq: Every day | ORAL | Status: DC

## 2010-11-11 MED ORDER — METFORMIN HCL 1000 MG PO TABS: 1000.0000 mg | ORAL_TABLET | Freq: Two times a day (BID) | ORAL | Status: DC

## 2010-11-11 MED ORDER — INSULIN GLARGINE 100 UNIT/ML ~~LOC~~ SOLN: 72.0000 [IU] | Freq: Every day | SUBCUTANEOUS | Status: DC

## 2010-11-11 MED ORDER — GABAPENTIN 300 MG PO CAPS: 300.0000 mg | ORAL_CAPSULE | Freq: Three times a day (TID) | ORAL | Status: DC

## 2010-11-11 NOTE — Assessment & Plan Note (Addendum)
Previously scheduled with [place] for colonoscopy, Jan 2012 had to cancel that because of cost issues.  Has Iron Def anemia-while in hospital Ferritin 12, Hgb 10.4, MCV 84  Will arrange colonoscopy-unfortunately this not done while she was in in the hospital. Pt advised to stop ASA (no known CAD dx)

## 2010-11-11 NOTE — Assessment & Plan Note (Signed)
Was just hospitalized for Chest Pain-BNP low-elevated, no overt CHF,   Primary goal BP control

## 2010-11-11 NOTE — Assessment & Plan Note (Signed)
Last exercised in November. Limited by back pain, foot pain, and deconditioning. Previously did water aerobics-really did like this but cost a barrier Try to do her best with diet and activity, given her situational stressors-dtr who lives with her severely depressed and was recently hosp at behavioral health

## 2010-11-11 NOTE — Assessment & Plan Note (Signed)
Stay off Ibuprofen

## 2010-11-11 NOTE — Progress Notes (Signed)
  Subjective:    Patient ID: [name], female    DOB: 1951/02/15, 60 y.o.   MRN: [id number]  HPI    Review of Systems     Objective:   Physical Exam  Constitutional: She appears well-developed and well-nourished.  HENT:  Head: Normocephalic and atraumatic.  Eyes: Conjunctivae and EOM are normal. Pupils are equal, round, and reactive to light.  Cardiovascular: Normal rate and regular rhythm.   Pulmonary/Chest: Effort normal and breath sounds normal.  Abdominal: Soft. Bowel sounds are normal.  Musculoskeletal: She exhibits no edema.  Skin: Skin is warm and dry.          Assessment & Plan:

## 2010-11-11 NOTE — Patient Instructions (Signed)
Stop Asprin, Ibuprofen, take only tylenol for pain  Follow up for colon evaluation to see where anemia is coming from  Add Cozaar to blood Pressure medications  Change lipitor (atorvastatin) to Pravastatin.

## 2010-11-11 NOTE — Assessment & Plan Note (Signed)
Ace intolerant from cough   Add cozaar 50 mg a day

## 2010-11-11 NOTE — Assessment & Plan Note (Signed)
Most recent TSH while hospitalized normal  Recheck once a year

## 2010-11-11 NOTE — Discharge Summary (Signed)
Summary: Hospital Discharge Update    Hospital Discharge Update:  Date of Admission: 10/28/2010 Date of Discharge: 10/31/2010  Brief Summary:  Pt is a 60 y/o woman witn acute dyspnea and  BP 220/123 no end organ damage, HTN urgency was 2/2 non-compliance.  Restarted home meds and BP came down to 130s/60s. Mildly orthostatic on 2nd hospital day therefore we held amlodpine and Neurotin. she was having some NV, we gave her phenergan, on d/c she was able to tolerate diet advance well, and is d/c'd home in good condition.   at op if bp and pain elevated restart amlodpine and Neurotin if needed. may need GES as outpatient.  Lab or other results pending at discharge:  none  Labs needed at follow-up: CBC with differential, Basic metabolic panel  Other follow-up issues:  follow, DM, HTN and other routine care.  Medication list changes:  Changed medication from LANTUS 100 UNIT/ML SOLN (INSULIN GLARGINE) Inject 72 units once daily. to LANTUS 100 UNIT/ML SOLN (INSULIN GLARGINE) Inject 36 units once daily at bedtime - Signed Removed medication of AMLODIPINE BESYLATE 10 MG TABS (AMLODIPINE BESYLATE) Take 1 tablet by mouth once a day Removed medication of NEURONTIN 300 MG CAPS (GABAPENTIN) Take 1 tablet by mouth three times a day after day 3. Rx of METFORMIN HCL 1000 MG TABS (METFORMIN HCL) take 1 tablet by mouth two times a day;  #200 x 0;  Signed;  Entered by: Almyra Deforest MD;  Authorized by: Almyra Deforest MD;  Method used: Print then Give to Patient Rx of BD INSULIN SYRINGE MICROFINE 28G X 1/2" 0.5 ML  MISC (INSULIN SYRINGE-NEEDLE U-100) as directed;  #200 x 0;  Signed;  Entered by: Almyra Deforest MD;  Authorized by: Almyra Deforest MD;  Method used: Print then Give to Patient Rx of FREESTYLE TEST  STRP (GLUCOSE BLOOD) use to check blood sugar twice daily;  #4 x 0;  Signed;  Entered by: Almyra Deforest MD;  Authorized by: Almyra Deforest MD;  Method used: Print then Give to Patient Rx of LANTUS 100  UNIT/ML SOLN (INSULIN GLARGINE) Inject 36 units once daily at bedtime;  #2 vial x 0;  Signed;  Entered by: Almyra Deforest MD;  Authorized by: Almyra Deforest MD;  Method used: Print then Give to Patient Rx of LANCETS 30G  MISC (LANCETS) use to check blood sugar twice a day;  #1 box x 0;  Signed;  Entered by: Almyra Deforest MD;  Authorized by: Almyra Deforest MD;  Method used: Print then Give to Patient Rx of METOPROLOL SUCCINATE 100 MG XR24H-TAB (METOPROLOL SUCCINATE) Take 1 tablet by mouth once a day;  #31 x 0;  Signed;  Entered by: Almyra Deforest MD;  Authorized by: Almyra Deforest MD;  Method used: Print then Give to Patient Rx of KAY CIEL 20 MEQ PACK (POTASSIUM CHLORIDE) Take 1 tablet by mouth once a day;  #93 x 0;  Signed;  Entered by: Almyra Deforest MD;  Authorized by: Almyra Deforest MD;  Method used: Print then Give to Patient Rx of LIPITOR 40 MG TABS (ATORVASTATIN CALCIUM) Take 1 tablet by mouth once a day;  #93 x 0;  Signed;  Entered by: Almyra Deforest MD;  Authorized by: Almyra Deforest MD;  Method used: Print then Give to Patient Rx of ASPIRIN 81 MG TBEC (ASPIRIN) Take 1 tablet by mouth once a day;  #93 x 0;  Signed;  Entered by: Almyra Deforest MD;  Authorized by: Almyra Deforest MD;  Method used: Print then Give  to Patient Rx of CLONIDINE HCL 0.2 MG TABS (CLONIDINE HCL) Take 1 tablet by mouth two times a day;  #62 x 0;  Signed;  Entered by: Almyra Deforest MD;  Authorized by: Almyra Deforest MD;  Method used: Print then Give to Patient Rx of CENTRUM  TABS (MULTIPLE VITAMINS-MINERALS) Take 1 tablet by mouth once a day;  #30 x 0;  Signed;  Entered by: Almyra Deforest MD;  Authorized by: Almyra Deforest MD;  Method used: Print then Give to Patient Rx of IBUPROFEN 800 MG TABS (IBUPROFEN) take 1 tablet every 6 hours as needed for pain;  #60 x 0;  Signed;  Entered by: Almyra Deforest MD;  Authorized by: Almyra Deforest MD;  Method used: Print then Give to Patient Rx of HYDROCHLOROTHIAZIDE 25 MG TABS  (HYDROCHLOROTHIAZIDE) Take 1 tablet by mouth once a day;  #31 x 0;  Signed;  Entered by: Almyra Deforest MD;  Authorized by: Almyra Deforest MD;  Method used: Print then Give to Patient Rx of CERON-DM 12.02-04-14 MG/5ML SYRP (PHENYLEPHRINE-CHLORPHEN-DM) 1-2 teaspoons every 6-8 hours for cough.;  #90 day supp x 0;  Signed;  Entered by: Almyra Deforest MD;  Authorized by: Almyra Deforest MD;  Method used: Print then Give to Patient Rx of LORATADINE 10 MG TABS (LORATADINE) Take 1 tablet by mouth two times a day as needed for allergies;  #60 x 0;  Signed;  Entered by: Almyra Deforest MD;  Authorized by: Almyra Deforest MD;  Method used: Print then Give to Patient Rx of CYCLOBENZAPRINE HCL 5 MG TABS (CYCLOBENZAPRINE HCL) three times a day;  #90 x 0;  Signed;  Entered by: Almyra Deforest MD;  Authorized by: Almyra Deforest MD;  Method used: Print then Give to Patient Rx of METFORMIN HCL 1000 MG TABS (METFORMIN HCL) take 1 tablet by mouth two times a day;  #6 x 0;  Signed;  Entered by: Almyra Deforest MD;  Authorized by: Almyra Deforest MD;  Method used: Print then Give to Patient Rx of LANTUS 100 UNIT/ML SOLN (INSULIN GLARGINE) Inject 36 units once daily at bedtime;  #1 vial x 0;  Signed;  Entered by: Almyra Deforest MD;  Authorized by: Almyra Deforest MD;  Method used: Print then Give to Patient Rx of BD INSULIN SYRINGE MICROFINE 28G X 1/2" 0.5 ML  MISC (INSULIN SYRINGE-NEEDLE U-100) as directed;  #4 x 0;  Signed;  Entered by: Almyra Deforest MD;  Authorized by: Almyra Deforest MD;  Method used: Print then Give to Patient Rx of FREESTYLE TEST  STRP (GLUCOSE BLOOD) use to check blood sugar twice daily;  #6 x 0;  Signed;  Entered by: Almyra Deforest MD;  Authorized by: Almyra Deforest MD;  Method used: Print then Give to Patient Rx of LANCETS 30G  MISC (LANCETS) use to check blood sugar twice a day;  #6 x 0;  Signed;  Entered by: Almyra Deforest MD;  Authorized by: Almyra Deforest MD;  Method used: Print then Give to Patient Rx of  METOPROLOL SUCCINATE 100 MG XR24H-TAB (METOPROLOL SUCCINATE) Take 1 tablet by mouth once a day;  #3 x 0;  Signed;  Entered by: Almyra Deforest MD;  Authorized by: Almyra Deforest MD;  Method used: Print then Give to Patient Rx of KAY CIEL 20 MEQ PACK (POTASSIUM CHLORIDE) Take 1 tablet by mouth once a day;  #3 x 0;  Signed;  Entered by: Almyra Deforest MD;  Authorized by: Almyra Deforest MD;  Method used: Print then Give to Patient Rx of  LIPITOR 40 MG TABS (ATORVASTATIN CALCIUM) Take 1 tablet by mouth once a day;  #3 x 0;  Signed;  Entered by: Almyra Deforest MD;  Authorized by: Almyra Deforest MD;  Method used: Print then Give to Patient Rx of ASPIRIN 81 MG TBEC (ASPIRIN) Take 1 tablet by mouth once a day;  #3 x 0;  Signed;  Entered by: Almyra Deforest MD;  Authorized by: Almyra Deforest MD;  Method used: Print then Give to Patient Rx of CLONIDINE HCL 0.2 MG TABS (CLONIDINE HCL) Take 1 tablet by mouth two times a day;  #6 x 0;  Signed;  Entered by: Almyra Deforest MD;  Authorized by: Almyra Deforest MD;  Method used: Print then Give to Patient Rx of CENTRUM  TABS (MULTIPLE VITAMINS-MINERALS) Take 1 tablet by mouth once a day;  #3 x 0;  Signed;  Entered by: Almyra Deforest MD;  Authorized by: Almyra Deforest MD;  Method used: Print then Give to Patient Rx of IBUPROFEN 800 MG TABS (IBUPROFEN) take 1 tablet every 6 hours as needed for pain;  #12 x 0;  Signed;  Entered by: Almyra Deforest MD;  Authorized by: Almyra Deforest MD;  Method used: Print then Give to Patient Rx of HYDROCHLOROTHIAZIDE 25 MG TABS (HYDROCHLOROTHIAZIDE) Take 1 tablet by mouth once a day;  #3 x 0;  Signed;  Entered by: Almyra Deforest MD;  Authorized by: Almyra Deforest MD;  Method used: Print then Give to Patient Rx of CERON-DM 12.02-04-14 MG/5ML SYRP (PHENYLEPHRINE-CHLORPHEN-DM) 1-2 teaspoons every 6-8 hours for cough.;  #3 day supp x 0;  Signed;  Entered by: Almyra Deforest MD;  Authorized by: Almyra Deforest MD;  Method used: Print then Give to Patient Rx  of LORATADINE 10 MG TABS (LORATADINE) Take 1 tablet by mouth two times a day as needed for allergies;  #6 x 0;  Signed;  Entered by: Almyra Deforest MD;  Authorized by: Almyra Deforest MD;  Method used: Print then Give to Patient Rx of CYCLOBENZAPRINE HCL 5 MG TABS (CYCLOBENZAPRINE HCL) three times a day;  #9 x 0;  Signed;  Entered by: Almyra Deforest MD;  Authorized by: Almyra Deforest MD;  Method used: Print then Give to Patient  Discharge medications:  METFORMIN HCL 1000 MG TABS (METFORMIN HCL) take 1 tablet by mouth two times a day LANTUS 100 UNIT/ML SOLN (INSULIN GLARGINE) Inject 36 units once daily at bedtime     BD INSULIN SYRINGE MICROFINE 28G X 1/2" 0.5 ML  MISC (INSULIN SYRINGE-NEEDLE U-100) as directed     FREESTYLE TEST  STRP (GLUCOSE BLOOD) use to check blood sugar twice daily     LANCETS 30G  MISC (LANCETS) use to check blood sugar twice a day METOPROLOL SUCCINATE 100 MG XR24H-TAB (METOPROLOL SUCCINATE) Take 1 tablet by mouth once a day     KAY CIEL 20 MEQ PACK (POTASSIUM CHLORIDE) Take 1 tablet by mouth once a day LIPITOR 40 MG TABS (ATORVASTATIN CALCIUM) Take 1 tablet by mouth once a day ASPIRIN 81 MG TBEC (ASPIRIN) Take 1 tablet by mouth once a day CLONIDINE HCL 0.2 MG TABS (CLONIDINE HCL) Take 1 tablet by mouth two times a day CENTRUM  TABS (MULTIPLE VITAMINS-MINERALS) Take 1 tablet by mouth once a day IBUPROFEN 800 MG TABS (IBUPROFEN) take 1 tablet every 6 hours as needed for pain HYDROCHLOROTHIAZIDE 25 MG TABS (HYDROCHLOROTHIAZIDE) Take 1 tablet by mouth once a day CERON-DM 12.02-04-14 MG/5ML SYRP (PHENYLEPHRINE-CHLORPHEN-DM) 1-2 teaspoons every 6-8 hours for cough. LORATADINE 10 MG TABS (LORATADINE)  Take 1 tablet by mouth two times a day as needed for allergies CYCLOBENZAPRINE HCL 5 MG TABS (CYCLOBENZAPRINE HCL) three times a day  Other patient instructions:  You have a follow up with Dr. Comer Locket on 11/11/10 at 8:15 am [facility].  Please take your medication as  prescribed below.  If you have any problem, Please call the clinic.   In case of an emergency  dial [phone] or go to the emergency department.    Note: Hospital Discharge Medications & Other Instructions handout was printed, one copy for patient and a second copy to be placed in hospital chart.  Prescriptions: CYCLOBENZAPRINE HCL 5 MG TABS (CYCLOBENZAPRINE HCL) three times a day  #9 x 0   Entered and Authorized by:   Almyra Deforest MD   Signed by:   Almyra Deforest MD on 10/31/2010   Method used:   Print then Give to Patient   RxID:   [id number] LORATADINE 10 MG TABS (LORATADINE) Take 1 tablet by mouth two times a day as needed for allergies  #6 x 0   Entered and Authorized by:   Almyra Deforest MD   Signed by:   Almyra Deforest MD on 10/31/2010   Method used:   Print then Give to Patient   RxID:   [id number] CERON-DM 12.02-04-14 MG/5ML SYRP (PHENYLEPHRINE-CHLORPHEN-DM) 1-2 teaspoons every 6-8 hours for cough.  #3 day supp x 0   Entered and Authorized by:   Almyra Deforest MD   Signed by:   Almyra Deforest MD on 10/31/2010   Method used:   Print then Give to Patient   RxID:   [id number] HYDROCHLOROTHIAZIDE 25 MG TABS (HYDROCHLOROTHIAZIDE) Take 1 tablet by mouth once a day  #3 x 0   Entered and Authorized by:   Almyra Deforest MD   Signed by:   Almyra Deforest MD on 10/31/2010   Method used:   Print then Give to Patient   RxID:   [id number] IBUPROFEN 800 MG TABS (IBUPROFEN) take 1 tablet every 6 hours as needed for pain  #12 x 0   Entered and Authorized by:   Almyra Deforest MD   Signed by:   Almyra Deforest MD on 10/31/2010   Method used:   Print then Give to Patient   RxID:   [id number] CENTRUM  TABS (MULTIPLE VITAMINS-MINERALS) Take 1 tablet by mouth once a day  #3 x 0   Entered and Authorized by:   Almyra Deforest MD   Signed by:   Almyra Deforest MD on 10/31/2010   Method used:   Print then Give to Patient   RxID:   [id number] CLONIDINE HCL 0.2 MG TABS  (CLONIDINE HCL) Take 1 tablet by mouth two times a day  #6 x 0   Entered and Authorized by:   Almyra Deforest MD   Signed by:   Almyra Deforest MD on 10/31/2010   Method used:   Print then Give to Patient   RxID:   [id number] ASPIRIN 81 MG TBEC (ASPIRIN) Take 1 tablet by mouth once a day  #3 x 0   Entered and Authorized by:   Almyra Deforest MD   Signed by:   Almyra Deforest MD on 10/31/2010   Method used:   Print then Give to Patient   RxID:   [id number] LIPITOR 40 MG TABS (ATORVASTATIN CALCIUM) Take 1 tablet by mouth once a day  #3 x 0   Entered and Authorized by:   Almyra Deforest MD   Signed  by:   Almyra Deforest MD on 10/31/2010   Method used:   Print then Give to Patient   RxID:   [id number] KAY CIEL 20 MEQ PACK (POTASSIUM CHLORIDE) Take 1 tablet by mouth once a day  #3 x 0   Entered and Authorized by:   Almyra Deforest MD   Signed by:   Almyra Deforest MD on 10/31/2010   Method used:   Print then Give to Patient   RxID:   [id number] METOPROLOL SUCCINATE 100 MG XR24H-TAB (METOPROLOL SUCCINATE) Take 1 tablet by mouth once a day  #3 x 0   Entered and Authorized by:   Almyra Deforest MD   Signed by:   Almyra Deforest MD on 10/31/2010   Method used:   Print then Give to Patient   RxID:   [id number] LANCETS 30G  MISC (LANCETS) use to check blood sugar twice a day  #6 x 0   Entered and Authorized by:   Almyra Deforest MD   Signed by:   Almyra Deforest MD on 10/31/2010   Method used:   Print then Give to Patient   RxID:   [id number] FREESTYLE TEST  STRP (GLUCOSE BLOOD) use to check blood sugar twice daily  #6 x 0   Entered and Authorized by:   Almyra Deforest MD   Signed by:   Almyra Deforest MD on 10/31/2010   Method used:   Print then Give to Patient   RxID:   [id number] BD INSULIN SYRINGE MICROFINE 28G X 1/2" 0.5 ML  MISC (INSULIN SYRINGE-NEEDLE U-100) as directed  #4 x 0   Entered and Authorized by:   Almyra Deforest MD   Signed by:   Almyra Deforest  MD on 10/31/2010   Method used:   Print then Give to Patient   RxID:   [id number] LANTUS 100 UNIT/ML SOLN (INSULIN GLARGINE) Inject 36 units once daily at bedtime  #1 vial x 0   Entered and Authorized by:   Almyra Deforest MD   Signed by:   Almyra Deforest MD on 10/31/2010   Method used:   Print then Give to Patient   RxID:   [id number] METFORMIN HCL 1000 MG TABS (METFORMIN HCL) take 1 tablet by mouth two times a day  #6 x 0   Entered and Authorized by:   Almyra Deforest MD   Signed by:   Almyra Deforest MD on 10/31/2010   Method used:   Print then Give to Patient   RxID:   [id number] CYCLOBENZAPRINE HCL 5 MG TABS (CYCLOBENZAPRINE HCL) three times a day  #90 x 0   Entered and Authorized by:   Almyra Deforest MD   Signed by:   Almyra Deforest MD on 10/31/2010   Method used:   Print then Give to Patient   RxID:   [id number] LORATADINE 10 MG TABS (LORATADINE) Take 1 tablet by mouth two times a day as needed for allergies  #60 x 0   Entered and Authorized by:   Almyra Deforest MD   Signed by:   Almyra Deforest MD on 10/31/2010   Method used:   Print then Give to Patient   RxID:   [id number] CERON-DM 12.02-04-14 MG/5ML SYRP (PHENYLEPHRINE-CHLORPHEN-DM) 1-2 teaspoons every 6-8 hours for cough.  #90 day supp x 0   Entered and Authorized by:   Almyra Deforest MD   Signed by:   Almyra Deforest MD on 10/31/2010   Method used:   Print then Give to Patient   RxID:  [id number] HYDROCHLOROTHIAZIDE 25 MG TABS (HYDROCHLOROTHIAZIDE) Take 1 tablet by mouth once a day  #31 x 0   Entered and Authorized by:   Almyra Deforest MD   Signed by:   Almyra Deforest MD on 10/31/2010   Method used:   Print then Give to Patient   RxID:   [id number] IBUPROFEN 800 MG TABS (IBUPROFEN) take 1 tablet every 6 hours as needed for pain  #60 x 0   Entered and Authorized by:   Almyra Deforest MD   Signed by:   Almyra Deforest MD on 10/31/2010   Method used:   Print then Give to Patient   RxID:    [id number] CENTRUM  TABS (MULTIPLE VITAMINS-MINERALS) Take 1 tablet by mouth once a day  #30 x 0   Entered and Authorized by:   Almyra Deforest MD   Signed by:   Almyra Deforest MD on 10/31/2010   Method used:   Print then Give to Patient   RxID:   [id number] CLONIDINE HCL 0.2 MG TABS (CLONIDINE HCL) Take 1 tablet by mouth two times a day  #62 x 0   Entered and Authorized by:   Almyra Deforest MD   Signed by:   Almyra Deforest MD on 10/31/2010   Method used:   Print then Give to Patient   RxID:   [id number] ASPIRIN 81 MG TBEC (ASPIRIN) Take 1 tablet by mouth once a day  #93 x 0   Entered and Authorized by:   Almyra Deforest MD   Signed by:   Almyra Deforest MD on 10/31/2010   Method used:   Print then Give to Patient   RxID:   [id number] LIPITOR 40 MG TABS (ATORVASTATIN CALCIUM) Take 1 tablet by mouth once a day  #93 x 0   Entered and Authorized by:   Almyra Deforest MD   Signed by:   Almyra Deforest MD on 10/31/2010   Method used:   Print then Give to Patient   RxID:   [id number] KAY CIEL 20 MEQ PACK (POTASSIUM CHLORIDE) Take 1 tablet by mouth once a day  #93 x 0   Entered and Authorized by:   Almyra Deforest MD   Signed by:   Almyra Deforest MD on 10/31/2010   Method used:   Print then Give to Patient   RxID:   [id number] METOPROLOL SUCCINATE 100 MG XR24H-TAB (METOPROLOL SUCCINATE) Take 1 tablet by mouth once a day  #31 x 0   Entered and Authorized by:   Almyra Deforest MD   Signed by:   Almyra Deforest MD on 10/31/2010   Method used:   Print then Give to Patient   RxID:   [id number] LANCETS 30G  MISC (LANCETS) use to check blood sugar twice a day  #1 box x 0   Entered and Authorized by:   Almyra Deforest MD   Signed by:   Almyra Deforest MD on 10/31/2010   Method used:   Print then Give to Patient   RxID:   [id number] LANTUS 100 UNIT/ML SOLN (INSULIN GLARGINE) Inject 36 units once daily at bedtime  #2 vial x 0   Entered and Authorized by:    Almyra Deforest MD   Signed by:   Almyra Deforest MD on 10/31/2010   Method used:   Print then Give to Patient   RxID:   [id number] FREESTYLE TEST  STRP (GLUCOSE BLOOD) use to check blood sugar twice daily  #4 x 0   Entered and Authorized by:  Almyra Deforest MD   Signed by:   Almyra Deforest MD on 10/31/2010   Method used:   Print then Give to Patient   RxID:   [id number] BD INSULIN SYRINGE MICROFINE 28G X 1/2" 0.5 ML  MISC (INSULIN SYRINGE-NEEDLE U-100) as directed  #200 x 0   Entered and Authorized by:   Almyra Deforest MD   Signed by:   Almyra Deforest MD on 10/31/2010   Method used:   Print then Give to Patient   RxID:   [id number] METFORMIN HCL 1000 MG TABS (METFORMIN HCL) take 1 tablet by mouth two times a day  #200 x 0   Entered and Authorized by:   Almyra Deforest MD   Signed by:   Almyra Deforest MD on 10/31/2010   Method used:   Print then Give to Patient   RxID:   [id number]

## 2010-11-11 NOTE — Assessment & Plan Note (Addendum)
Has meter with her. Reports indicate moderate control; with range 95 to 200. Her readings are a bit thin to make too  Much of an interpretation, await A1c readings Foot exam ok No change in insulin doses

## 2010-11-11 NOTE — Assessment & Plan Note (Signed)
Change Atorvastatin to Pravastatin 40 mg

## 2010-11-12 ENCOUNTER — Encounter: Payer: Self-pay | Admitting: *Deleted

## 2010-11-12 ENCOUNTER — Encounter (INDEPENDENT_AMBULATORY_CARE_PROVIDER_SITE_OTHER): Payer: Self-pay | Admitting: *Deleted

## 2010-11-12 ENCOUNTER — Telehealth: Payer: Self-pay | Admitting: *Deleted

## 2010-11-12 NOTE — Telephone Encounter (Signed)
Patient had missed her 10/23/10 appt. With Dr. Gerilyn Pilgrim at [facility].  Dr. Reche Dixon requested pt. To be re-scheduled.  Appt. Made; but unable to get in contact w/pt.  I will mail appt. To pt. Which is March 16,2012 @ 3:00PM w/ Dr. Christella Hartigan.

## 2010-11-16 NOTE — Telephone Encounter (Signed)
Note patient has iron deficiency anemia of unexplained origin, so it is important that she get this done. Please put a reminder in the schedule to call her back in 2 weeks-phone may be back on by then  DT

## 2010-11-19 NOTE — Letter (Signed)
Summary: New Patient letter  [facility]  [address] [place], [place] [address]   Phone: [phone]  Fax: [phone]       11/12/2010 MRN: [id number]  [facility] [address] [place], [place]  [address]  [place]  Dear [name],  Welcome to the Gastroenterology Division at [facility].    You are scheduled to see Dr.  Christella Hartigan on 12-18-10 at 3:00P.M. on the 3rd floor at [facility], [address].  We ask that you try to arrive at our office 15 minutes prior to your appointment time to allow for check-in.  We would like you to complete the enclosed self-administered evaluation form prior to your visit and bring it with you on the day of your appointment.  We will review it with you.  Also, please bring a complete list of all your medications or, if you prefer, bring the medication bottles and we will list them.  Please bring your insurance card so that we may make a copy of it.  If your insurance requires a referral to see a specialist, please bring your referral form from your primary care physician.  Co-payments are due at the time of your visit and may be paid by cash, check or credit card.     Your office visit will consist of a consult with your physician (includes a physical exam), any laboratory testing he/she may order, scheduling of any necessary diagnostic testing (e.g. x-ray, ultrasound, CT-scan), and scheduling of a procedure (e.g. Endoscopy, Colonoscopy) if required.  Please allow enough time on your schedule to allow for any/all of these possibilities.    If you cannot keep your appointment, please call [phone] to cancel or reschedule prior to your appointment date.  This allows [place] the opportunity to schedule an appointment for another patient in need of care.  If you do not cancel or reschedule by 5 p.m. the business day prior to your appointment date, you will be charged a $50.00 late cancellation/no-show fee.    Thank you for  choosing [facility] for your medical needs.  We appreciate the opportunity to care for you.  Please visit [place] at our website  to learn more about our practice.                     Sincerely,                                                             The Gastroenterology Division

## 2010-11-27 ENCOUNTER — Telehealth: Payer: Self-pay | Admitting: *Deleted

## 2010-11-27 NOTE — Telephone Encounter (Signed)
Pt here said that the pain medication that she is on is not working.  York Spaniel that this is probably why her blood pressure is elevated.  Would like something different.

## 2010-11-27 NOTE — Telephone Encounter (Signed)
[facility] does not carry this medication.  Pt went to an outside pharmacy cost is $60.00.  [facility] does carry Diltiazem  if you would like to change the medication to this.

## 2010-11-27 NOTE — Telephone Encounter (Signed)
I have never seen the patient as I was just reassigned as PCP.   What medicine [facility] not [facility]?  What pain med is she taking that is not working?  Dr Reche Dixon saw pt earlier this month, would you be willing to check with him to see if he can help.  Thanks

## 2010-12-03 ENCOUNTER — Ambulatory Visit (INDEPENDENT_AMBULATORY_CARE_PROVIDER_SITE_OTHER): Payer: Self-pay | Admitting: Internal Medicine

## 2010-12-03 ENCOUNTER — Other Ambulatory Visit: Payer: Self-pay | Admitting: *Deleted

## 2010-12-03 VITALS — BP 133/74 | HR 65 | Temp 97.0°F | Wt 218.9 lb

## 2010-12-03 DIAGNOSIS — M7060 Trochanteric bursitis, unspecified hip: Secondary | ICD-10-CM

## 2010-12-03 DIAGNOSIS — M76899 Other specified enthesopathies of unspecified lower limb, excluding foot: Secondary | ICD-10-CM

## 2010-12-03 DIAGNOSIS — I10 Essential (primary) hypertension: Secondary | ICD-10-CM

## 2010-12-03 NOTE — Patient Instructions (Signed)
APPLY ICE PACK TODAY WHEN YOU GET HOME AND TONIGHT, FOR ABOUT 5-10 MINUTES, OVER THE SPOT WHERE YOU GOT THE INJECTION  THE PAIN WILL COME BACK AFTER THE NUMBING MEDICINE WEARS OFF THEN SHOULD EASE UP OVER THE NEXT TWO-THREE DAYS  YOU WILL NEED TO GET INTO A WEIGHT WATCHER'S PROGRAM TO GET YOUR WEIGHT DOWN TO KEEP THE SYMPTOMS FROM COMING BACK

## 2010-12-03 NOTE — Telephone Encounter (Signed)
Pt D/C'd 1/29 and was not on ACE or ARB. Was asked to F/U in [facility] to adjust meds. Was seen Dr Reche Dixon for unrelated issue and BP 133/74. No ARB needed now. If does need for BP control or DM nephropathy, would prefer ACE as no indication of allergy in [facility].

## 2010-12-03 NOTE — Telephone Encounter (Signed)
Cozaar is no longer available at [facility].  But can provide Avapro, Diovan, Micardis, or Benicar as an alternative at no cost. Thanks

## 2010-12-03 NOTE — Progress Notes (Signed)
  Subjective:    Patient ID: [name], female    DOB: [birth year], 60 y.o.   MRN: [id number]  HPI Was here scheduled as a BP check--that checked well, as above.  New issue-leg pain-diretly over GTB on right with radiation down right lateral thigh. Very painful to walk, and she wakes at night if she sleeps on that side    Review of Systems  No joint pains elsewhere or recent injuries     Objective:   Physical Exam    Walks with some difficulty. Very deconditioned-lots of huffing, puffing and pulling to get her up on the exam table. Pain directly over GTB on right with good I/E rotation on passive ROM Right knee is nomal     Assessment & Plan:   Greater Trochanteric Bursitis, Right  PROCEDURE: RIGHT GTB INJECTION   The right hip prepped with betadine x 3 Mix of 3 cc 1% lidocaine without epinephrine and 80 mg kenalog (2 cc) injected into the region of the right GTB No complications, and her pain abated promptly after the injection

## 2010-12-04 ENCOUNTER — Other Ambulatory Visit: Payer: Self-pay | Admitting: Internal Medicine

## 2010-12-04 NOTE — Telephone Encounter (Signed)
Called answer to dianne at [facility]

## 2010-12-15 ENCOUNTER — Ambulatory Visit: Payer: Self-pay | Admitting: Dietician

## 2010-12-15 ENCOUNTER — Other Ambulatory Visit: Payer: Self-pay | Admitting: *Deleted

## 2010-12-15 DIAGNOSIS — I10 Essential (primary) hypertension: Secondary | ICD-10-CM

## 2010-12-15 LAB — GLUCOSE, CAPILLARY: Glucose-Capillary: 149 mg/dL — ABNORMAL HIGH (ref 70–99)

## 2010-12-15 NOTE — Telephone Encounter (Signed)
Pharmacy no longer carries Cozaar.  Would you consider Avapro, Diovan, Micardis or Benicar as an alternative.  Can be given to pt at no charge.

## 2010-12-15 NOTE — Telephone Encounter (Signed)
I already addressed this 12/03/10. See that note. Thanks

## 2010-12-17 LAB — COMPREHENSIVE METABOLIC PANEL
AST: 21 U/L (ref 0–37)
Albumin: 3.7 g/dL (ref 3.5–5.2)
Alkaline Phosphatase: 66 U/L (ref 39–117)
BUN: 16 mg/dL (ref 6–23)
CO2: 24 mEq/L (ref 19–32)
Chloride: 108 mEq/L (ref 96–112)
GFR calc Af Amer: >60 mL/min (ref >60)
GFR calc non Af Amer: >60 mL/min (ref >60)
Potassium: 4 mEq/L (ref 3.5–5.1)
Total Bilirubin: 0.4 mg/dL (ref 0.3–1.2)

## 2010-12-17 LAB — POCT I-STAT, CHEM 8
Creatinine, Ser: 0.8 mg/dL (ref 0.4–1.2)
Glucose, Bld: 145 mg/dL — ABNORMAL HIGH (ref 70–99)
HCT: 35 % — ABNORMAL LOW (ref 36.0–46.0)
Hemoglobin: 11.9 g/dL — ABNORMAL LOW (ref 12.0–15.0)
Sodium: 142 mEq/L (ref 135–145)
TCO2: 25 mmol/L (ref 0–100)

## 2010-12-17 LAB — POCT CARDIAC MARKERS
CKMB, poc: <1 ng/mL — ABNORMAL LOW (ref 1.0–8.0)
CKMB, poc: <1 ng/mL — ABNORMAL LOW (ref 1.0–8.0)
Myoglobin, poc: 64.3 ng/mL (ref 12–200)
Myoglobin, poc: 69.9 ng/mL (ref 12–200)
Troponin i, poc: <0.05 ng/mL (ref 0.00–0.09)
Troponin i, poc: <0.05 ng/mL (ref 0.00–0.09)

## 2010-12-17 LAB — CBC
Hemoglobin: 10.5 g/dL — ABNORMAL LOW (ref 12.0–15.0)
MCV: 84.6 fL (ref 78.0–100.0)
Platelets: 243 10*3/uL (ref 150–400)
RBC: 3.96 MIL/uL (ref 3.87–5.11)
WBC: 6.5 10*3/uL (ref 4.0–10.5)

## 2010-12-17 LAB — DIFFERENTIAL
Basophils Absolute: 0 10*3/uL (ref 0.0–0.1)
Basophils Relative: 0 % (ref 0–1)
Eosinophils Relative: 3 % (ref 0–5)
Monocytes Absolute: 0.5 10*3/uL (ref 0.1–1.0)

## 2010-12-18 ENCOUNTER — Ambulatory Visit (INDEPENDENT_AMBULATORY_CARE_PROVIDER_SITE_OTHER): Payer: Self-pay | Admitting: Gastroenterology

## 2010-12-18 ENCOUNTER — Ambulatory Visit: Payer: Self-pay | Admitting: Gastroenterology

## 2010-12-18 ENCOUNTER — Encounter: Payer: Self-pay | Admitting: Gastroenterology

## 2010-12-18 DIAGNOSIS — D649 Anemia, unspecified: Secondary | ICD-10-CM

## 2010-12-18 DIAGNOSIS — R195 Other fecal abnormalities: Secondary | ICD-10-CM

## 2010-12-19 ENCOUNTER — Encounter: Payer: Self-pay | Admitting: Ophthalmology

## 2010-12-19 LAB — GLUCOSE, CAPILLARY: Glucose-Capillary: 114 mg/dL — ABNORMAL HIGH (ref 70–99)

## 2010-12-20 LAB — GLUCOSE, CAPILLARY: Glucose-Capillary: 192 mg/dL — ABNORMAL HIGH (ref 70–99)

## 2010-12-22 LAB — GLUCOSE, CAPILLARY: Glucose-Capillary: 96 mg/dL (ref 70–99)

## 2010-12-22 NOTE — Assessment & Plan Note (Signed)
History of Present Illness Visit Type: Initial Consult Primary GI MD: Rob Bunting MD Primary Provider: Donia Guiles, MD Requesting Provider: Donia Guiles, MD Chief Complaint: Anemia and rectal bleeding  History of Present Illness:      pleasant 60 year old woman who has seen to other GI MDs in town (Dr. Sanford Bing and Dr. Loreta Ave).   she underwent colonoscopy in 1994 and also around 2000. She believes she has had polyps removed from her colon at least at one point.  She had tarry stools even before iron was started.  She occasionally see red blood in her stool.  has had FOB positive stool in the past 1 to 2 years.  recommended to have colonoscopy but declined referral.  overall weigth is stable over past year or so.  feels bloated.   recent blood tests show that she is slightly anemic, slightly microcytic.           Current Medications (verified): 1)  Metformin Hcl 1000 Mg Tabs (Metformin Hcl) .... Take 1 Tablet By Mouth Two Times A Day 2)  Lantus 100 Unit/ml Soln (Insulin Glargine) .... Inject 72 Units Once Daily At Bedtime 3)  Bd Insulin Syringe Microfine 28g X 1/2" 0.5 Ml  Misc (Insulin Syringe-Needle U-100) .... As Directed 4)  Freestyle Test  Strp (Glucose Blood) .... Use To Check Blood Sugar Twice Daily 5)  Lancets 30g  Misc (Lancets) .... Use To Check Blood Sugar Twice A Day 6)  Metoprolol Succinate 100 Mg Xr24h-Tab (Metoprolol Succinate) .... Take 1 Tablet By Mouth Once A Day 7)  Kay Ciel 20 Meq Pack (Potassium Chloride) .... Take 1 Tablet By Mouth Once A Day 8)  Lipitor 40 Mg Tabs (Atorvastatin Calcium) .... Take 1 Tablet By Mouth Once A Day 9)  Aspirin 81 Mg Tbec (Aspirin) .... Take 1 Tablet By Mouth Once A Day 10)  Clonidine Hcl 0.2 Mg Tabs (Clonidine Hcl) .... Take 1 Tablet By Mouth Two Times A Day 11)  Centrum  Tabs (Multiple Vitamins-Minerals) .... Take 1 Tablet By Mouth Once A Day 12)  Ibuprofen 800 Mg Tabs (Ibuprofen) .... Take 1 Tablet Every 6 Hours As Needed For  Pain 13)  Hydrochlorothiazide 25 Mg Tabs (Hydrochlorothiazide) .... Take 1 Tablet By Mouth Once A Day 14)  Ceron-Dm 12.02-04-14 Mg/19ml Syrp (Phenylephrine-Chlorphen-Dm) .Marland Kitchen.. 1-2 Teaspoons Every 6-8 Hours For Cough. 15)  Loratadine 10 Mg Tabs (Loratadine) .... Take 1 Tablet By Mouth Two Times A Day As Needed For Allergies 16)  Cyclobenzaprine Hcl 5 Mg Tabs (Cyclobenzaprine Hcl) .... Three Times A Day 17)  Vitamin B-12 1000 Mcg Tabs (Cyanocobalamin) .... Take One By Mouth Once Daily 18)  Black Cohosh 200 Mg Caps (Black Cohosh) .... Take One By Mouth Once Daily 19)  Ferrous Sulfate 325 (65 Fe) Mg Tabs (Ferrous Sulfate) .... Take One By Mouth Two Times A Day  Allergies: 1)  ! Benadryl 2)  ! * Flu Vaccination 3)  ! Percocet (Oxycodone-Acetaminophen)  Past History:  Past Medical History: Breast cancer, hx of, left 2006 (invasive papillary CA, ductal CA in situ) Cardiac cath 11/07 showed no ischemia, no significant disease. EF 55% by ECHO 11/07 Diabetes mellitus, type II Hypertension GERD Hyperlipidemia Obesity H/o colon polyp seen on Colonoscopy by Dr. Madilyn Fireman 1995(per pt) Repeat Colonoscopy by Dr. Loreta Ave (12/Isolated diverticulum in mid transverse colon.Small non bleeding internal hemorrhoi Repeat Colonoscopy every 5 years. Moderate OSA on sleep study(02/16/07) HTN crisis admitted to [facility] 3/11 (see record)  Past Surgical History: S/P surgery and  chemotherapy for lt breast cancer. colon polypectomy -2004. hysterectomy 1985.   Family History: Family History Diabetes 1st degree relative: father passed of its complications at 45 Family History Lung cancer: mother died at 29 Brother died of a PE Sister died of breast CA    Social History: Occupation: works Curator part time Single, lives by herself Never Smoked Alcohol use-no Drug use-no    Review of Systems       Pertinent positive and negative review of systems were noted in the above HPI and GI specific review of  systems.  All other review of systems was otherwise negative.   Vital Signs:  Patient profile:   60 year old female Height:      61 inches Weight:      214.0 pounds BMI:     40.58 Pulse rate:   80 / minute Pulse rhythm:   regular BP sitting:   126 / 72  (left arm) Cuff size:   large  Vitals Entered By: Harlow Mares CMA Duncan Dull) (December 18, 2010 3:05 PM)  Physical Exam  Additional Exam:  Constitutional:  Obese, otherwise generally well appearing Psychiatric: alert and oriented times 3 Eyes: extraocular movements intact Mouth: oropharynx moist, no lesions Neck: supple, no lymphadenopathy Cardiovascular: heart regular rate and rythm Lungs: CTA bilaterally Abdomen: soft, non-tender, non-distended, no obvious ascites, no peritoneal signs, normal bowel sounds Extremities: no lower extremity edema bilaterally Skin: no lesions on visible extremities    Impression & Recommendations:  Problem # 1:   Hemoccult-positive anemia  she has seen tarry colored stools and is also overdue for polyp surveillance colonoscopy. We will schedule her for colonoscopy and upper endoscopy at her soonest convenience.  Patient Instructions: 1)  You will be scheduled to have a colonoscopy. 2)  You will be scheduled to have an upper endoscopy. 3)  The medication list was reviewed and reconciled.  All changed / newly prescribed medications were explained.  A complete medication list was provided to the patient / caregiver.  Appended Document:  Pt will call next week to schedule the schedule is not out for May

## 2010-12-25 ENCOUNTER — Encounter (INDEPENDENT_AMBULATORY_CARE_PROVIDER_SITE_OTHER): Payer: Self-pay | Admitting: *Deleted

## 2010-12-31 ENCOUNTER — Telehealth: Payer: Self-pay | Admitting: *Deleted

## 2010-12-31 ENCOUNTER — Emergency Department (HOSPITAL_COMMUNITY)
Admission: EM | Admit: 2010-12-31 | Discharge: 2010-12-31 | Discharge status: Against Medical Advice | Payer: Self-pay | Attending: Emergency Medicine | Admitting: Emergency Medicine

## 2010-12-31 ENCOUNTER — Emergency Department (HOSPITAL_COMMUNITY): Payer: Self-pay

## 2010-12-31 DIAGNOSIS — R0602 Shortness of breath: Secondary | ICD-10-CM | POA: Insufficient documentation

## 2010-12-31 DIAGNOSIS — E785 Hyperlipidemia, unspecified: Secondary | ICD-10-CM | POA: Insufficient documentation

## 2010-12-31 DIAGNOSIS — Z853 Personal history of malignant neoplasm of breast: Secondary | ICD-10-CM | POA: Insufficient documentation

## 2010-12-31 DIAGNOSIS — I10 Essential (primary) hypertension: Secondary | ICD-10-CM | POA: Insufficient documentation

## 2010-12-31 DIAGNOSIS — R0989 Other specified symptoms and signs involving the circulatory and respiratory systems: Secondary | ICD-10-CM | POA: Insufficient documentation

## 2010-12-31 DIAGNOSIS — I509 Heart failure, unspecified: Secondary | ICD-10-CM | POA: Insufficient documentation

## 2010-12-31 DIAGNOSIS — K219 Gastro-esophageal reflux disease without esophagitis: Secondary | ICD-10-CM | POA: Insufficient documentation

## 2010-12-31 DIAGNOSIS — R0609 Other forms of dyspnea: Secondary | ICD-10-CM | POA: Insufficient documentation

## 2010-12-31 DIAGNOSIS — Z79899 Other long term (current) drug therapy: Secondary | ICD-10-CM | POA: Insufficient documentation

## 2010-12-31 DIAGNOSIS — R071 Chest pain on breathing: Secondary | ICD-10-CM | POA: Insufficient documentation

## 2010-12-31 DIAGNOSIS — E119 Type 2 diabetes mellitus without complications: Secondary | ICD-10-CM | POA: Insufficient documentation

## 2010-12-31 LAB — COMPREHENSIVE METABOLIC PANEL
Albumin: 3.6 g/dL (ref 3.5–5.2)
Alkaline Phosphatase: 70 U/L (ref 39–117)
BUN: 16 mg/dL (ref 6–23)
CO2: 30 mEq/L (ref 19–32)
Chloride: 100 mEq/L (ref 96–112)
Potassium: 4 mEq/L (ref 3.5–5.1)
Total Bilirubin: 0.6 mg/dL (ref 0.3–1.2)

## 2010-12-31 LAB — URINE MICROSCOPIC-ADD ON

## 2010-12-31 LAB — CBC
MCH: 27.5 pg (ref 26.0–34.0)
MCHC: 32.6 g/dL (ref 30.0–36.0)
MCV: 84.4 fL (ref 78.0–100.0)
Platelets: 299 10*3/uL (ref 150–400)
RBC: 4.43 MIL/uL (ref 3.87–5.11)
RDW: 17.6 % — ABNORMAL HIGH (ref 11.5–15.5)

## 2010-12-31 LAB — URINALYSIS, ROUTINE W REFLEX MICROSCOPIC
Glucose, UA: NEGATIVE mg/dL
Hgb urine dipstick: NEGATIVE
Ketones, ur: NEGATIVE mg/dL
Protein, ur: NEGATIVE mg/dL

## 2010-12-31 LAB — POCT CARDIAC MARKERS
CKMB, poc: 1.2 ng/mL (ref 1.0–8.0)
Myoglobin, poc: 164 ng/mL (ref 12–200)
Troponin i, poc: <0.05 ng/mL (ref 0.00–0.09)

## 2010-12-31 LAB — DIFFERENTIAL
Eosinophils Absolute: 0.1 10*3/uL (ref 0.0–0.7)
Eosinophils Relative: 1 % (ref 0–5)
Lymphs Abs: 3.3 10*3/uL (ref 0.7–4.0)
Monocytes Absolute: 0.7 10*3/uL (ref 0.1–1.0)
Monocytes Relative: 7 % (ref 3–12)

## 2010-12-31 LAB — GLUCOSE, CAPILLARY

## 2010-12-31 NOTE — Telephone Encounter (Signed)
As previously started, pt calls c/o chest pain like she had before when it was her heart, she states she was admitted in feb for this and has been having chest pain all night, has not tried anything to alleviate, nothing seems to aggravate. She states she is afraid, she is directed to the ED asap and ask to call for an ED f/u

## 2010-12-31 NOTE — Letter (Signed)
Summary: Pre Visit Letter Revised  [facility]  [address] [place], [place] [address]   Phone: [phone]  Fax: [phone]        12/25/2010 MRN: [id number] [facility] [address] [place], [place]  [address]  [place]             Procedure Date:  02-02-11           Direct Colon---Dr. Christella Hartigan   Welcome to the Gastroenterology Division at [facility]    You are scheduled to see a nurse for your pre-procedure visit on 01-19-11 at 1:00p.m. on the 3rd floor at [facility], [address]  We ask that you try to arrive at our office 15 minutes prior to your appointment time to allow for check-in.  Please take a minute to review the attached form.  If you answer "Yes" to one or more of the questions on the first page, we ask that you call the person listed at your earliest opportunity.  If you answer "No" to all of the questions, please complete the rest of the form and bring it to your appointment.    Your nurse visit will consist of discussing your medical and surgical history, your immediate family medical history, and your medications.   If you are unable to list all of your medications on the form, please bring the medication bottles to your appointment and we will list them.  We will need to be aware of both prescribed and over the counter drugs.  We will need to know exact dosage information as well.    Please be prepared to read and sign documents such as consent forms, a financial agreement, and acknowledgement forms.  If necessary, and with your consent, a friend or relative is welcome to sit-in on the nurse visit with you.  Please bring your insurance card so that we may make a copy of it.  If your insurance requires a referral to see a specialist, please bring your referral form from your primary care physician.  No co-pay is required for this nurse visit.     If you cannot keep your appointment, please call [phone] to cancel or  reschedule prior to your appointment date.  This allows [place] the opportunity to schedule an appointment for another patient in need of care.    Thank you for choosing [facility] for your medical needs.  We appreciate the opportunity to care for you.  Please visit [place] at our website  to learn more about our practice.  Sincerely, The Gastroenterology Division

## 2010-12-31 NOTE — Telephone Encounter (Signed)
Pt calls c/o of chest

## 2011-01-02 LAB — URINE CULTURE: Culture  Setup Time: 201203291707

## 2011-01-05 LAB — POCT I-STAT, CHEM 8
BUN: 20 mg/dL (ref 6–23)
Calcium, Ion: 1.21 mmol/L (ref 1.12–1.32)
Chloride: 105 mEq/L (ref 96–112)
Creatinine, Ser: 0.7 mg/dL (ref 0.4–1.2)
Glucose, Bld: 178 mg/dL — ABNORMAL HIGH (ref 70–99)
HCT: 37 % (ref 36.0–46.0)
Potassium: 3.8 mEq/L (ref 3.5–5.1)

## 2011-01-05 NOTE — Discharge Summary (Signed)
[name], [name]               ACCOUNT NO.:  [id number]  MEDICAL RECORD NO.:  [id number]          PATIENT TYPE:  INP  LOCATION:  2505                         FACILITY:  [facility]  PHYSICIAN:  Tilford Pillar, MD     DATE OF BIRTH:  12-May-1951  DATE OF ADMISSION:  10/29/2010 DATE OF DISCHARGE:  10/31/2010                              DISCHARGE SUMMARY   DISCHARGE DIAGNOSES: 1. Hypertensive urgency. 2. History of congestive heart failure. 3. Anemia. 4. Type 2 diabetes with A1c 7.1 in September 2011. 5. Hypothyroidism. 6. History of obstructive sleep apnea. 7. History of breast cancer. 8. History of hypertension. 9. History of noncardiac chest pain.  DISCHARGE MEDICATIONS: 1. Metformin 1000 mg 1 tablet by mouth two times a day. 2. Lantus 36 units once daily subcutaneous at bedtime. 3. Insulin syringes, strips and lancets. 4. Metoprolol 100 mg XR one tablet by mouth once a day. 5. KCl 20 mEq pack one tablet by mouth once a day. 6. Lipitor 40 mg tablets, 1 tablet by mouth once a day. 7. Aspirin 81 mg tablets, 1 tablet by mouth once a day. 8. Clonidine 0.2 mg tablets, 1 tablet by mouth two times a day. 9. Centrum tablets, 1 tablet by mouth once a day. 10.Ibuprofen 800 mg tablets, take 1 tablet every 6 hours as needed for     pain. 11.Hydrochlorothiazide 25 mg tablets, take 1 tablet by mouth once a     day. 12.Ceron DM 12.02/04/14 mg per 5 mL syrup, take 1-2 teaspoons every 6-8     hours for coughing. 13.Loratadine 10 mg tablets, take 1 tablet by mouth as needed for     allergy. 14.Cyclobenzaprine 5 mg tablets, take 1 tablet by mouth 3 times a day     as needed for back spasms.  DISPOSITION AND FOLLOWUP:  At discharge, the patient's blood pressure was stable and she was tolerating p.o. intake and had no other complaints or pains.  The patient will follow up at the Outpatient Clinic at [facility] on November 11, 2010, at 8:15 a.m. with Dr. Comer Locket.  There, Dr. Comer Locket  can evaluate the patient's diabetes, blood pressure, and cholesterol status and make any adjustments that he sees fit.  PROCEDURES PERFORMED:  None.  CONSULTATIONS:  None.  BRIEF ADMITTING HISTORY AND PHYSICAL:  The patient is a 60 year old female with a past medical history of breast cancer, coronary artery disease, systolic and diastolic heart failure, type 2 diabetes, hypertension, who presented to [facility] with a chief complaint of shortness of breath lasting several days prior to admission.  During those times, she endorsed wheezing, leg swelling, and dry cough.  With the cough, some lower left chest pain was associated; however, the patient denied any fever or chills.  No mucus from her cough was produced.  She denied any exertional chest pain.  In the emergency department, the patient's blood pressure was found to be severely elevated.  She was given IV labetalol and she was started on a drip.  She was also given morphine x2 and aspirin and antiemetic Zofran.  PHYSICAL EXAMINATION:  VITALS:  Blood pressure  220/123 with drop to 198/207, pulse 98-75, respiratory rate 20, O2 saturation 100% on 2 L. GENERAL:  Alert, well developed, and cooperative to exam. HEAD:  Normocephalic, atraumatic EYES:  Vision grossly intact.  Pupils round, equal, and reactive to light. MOUTH:  Pharynx pink and moist.  No erythema.  No exudate. NECK:  Supple.  Full range of motion.  No thyromegaly.  No JVD.  No bruits. LUNGS:  Normal respiratory effort.  No accessory muscle use.  Normal breath sounds.  No crackles.  No wheezing. CARDIOVASCULAR:  Regular rate and rhythm.  No murmurs, rubs, or gallops. Moderate tenderness over the left lower chest wall, likely secondary to coughing or muscle strain. ABDOMEN:  Nontender, positive bowel sounds.  No hepatomegaly.  No splenomegaly detected. MUSCULOSKELETAL:  Extremities strength 5/5 and equal bilaterally and no joint erythema, effusion, or  warmth.  No edema. NEUROLOGIC:  Alert and oriented x3.  Cranial nerves II through XII intact in all extremities.  Sensation intact to light touch and gait was normal.  HOSPITAL COURSE BY PROBLEM: 1. Hypertensive urgency.  There were no signs of end-organ damage from     the patient's elevated blood pressure.  No signs of renal crisis or     any other findings.  The patient's blood pressure elevation was due     to noncompliance.  The patient's home blood pressure medications     were restarted, and the patient's blood pressure rapidly dropped by     the first hospitalization day.  Several hours after receiving her     medication, her blood pressure dropped to 124/51.  Other causes for     her hypertensive urgency were evaluated.  The patient did not have     any signs of CHF.  She had no signs of fluid overload per x-ray and     per physical exam and with a BMP which was at her baseline.  There     was no sign of aortic dissection.  No signs of angina or acute     coronary syndrome.  Other diagnoses such as pheochromocytoma were     also considered, but they were unlikely given presentation and     given the rapid response to the patient's blood pressure     medication.  The patient's blood pressure was extremely controlled     on clonidine and metoprolol only.  The patient's amlodipine was     held, this may be restarted as an outpatient if the patient's blood     pressure begins to increase again.  At discharge, the patient's     blood pressure was 112/47 with a pulse of 65.  The patient     received a social work consult to help her with her medication for     the first 3 days.  As the patient is not able to pay for her own     medications at the outpatient clinic, we would urge to work with     this patient and with our social worker down the clinic to help     this patient attain her medications to avoid further complications     in the future. 2. Anemia.  Her hemoglobin was  stable.  There was no sign of bleeding.     FOBT was negative.  She did have a history of blood in her stool;     however, she has refused a GI referral secondary to financial     problems.  We will work with this as an outpatient. 3. Diabetes type 2.  Hemoglobin A1c of 7.2.  Not on any medications at     home.  She was supposed to be on Lantus, metformin, and aspirin;     however, at discharged we only provided the patient with metformin     b.i.d. and Lantus 36 units which was half of her dose before.  As     an outpatient, one may consider adding an ACE for this patient for     renal protection, adjust her diabetic regimen as fit. 4. History of hypothyroidism.  This was reported per chart.  A TSH was     obtained and was within normal limits.  The patient was not     restarted on any medication for that.  DISCHARGE VITALS AND LABORATORY DATA:  Blood pressure 112/47, pulse rate was 65, respiratory 18, O2 saturation 95% on room air.  Sodium was 140, potassium was 3.7, chloride 103, bicarb 28, glucose 126, BUN 10, creatinine is 0.9, calcium 9.5.  White blood cell count 6.5, hemoglobin 9.9, hematocrit 32, platelet count is 224,000.     Darnelle Maffucci, MD   ______________________________ Tilford Pillar, MD   PT/MEDQ  D:  11/01/2010  T:  11/01/2010  Job:  [id number]  cc:   Outpatient Clinic at [facility]  Electronically Signed by Darnelle Maffucci  on 11/20/2010 03:18:43 PM Electronically Signed by Tilford Pillar  on 11/29/2010 09:09:24 PM

## 2011-01-06 ENCOUNTER — Other Ambulatory Visit: Payer: Self-pay | Admitting: Internal Medicine

## 2011-01-06 ENCOUNTER — Other Ambulatory Visit: Payer: Self-pay | Admitting: *Deleted

## 2011-01-06 DIAGNOSIS — E119 Type 2 diabetes mellitus without complications: Secondary | ICD-10-CM

## 2011-01-06 MED ORDER — INSULIN NPH ISOPHANE & REGULAR (70-30) 100 UNIT/ML ~~LOC~~ SUSP: SUBCUTANEOUS | Status: DC

## 2011-01-06 NOTE — Telephone Encounter (Signed)
Talked with pt and she has new insulin.  She will check cbg frequently for next week. She will make return appointment for next week. She will call if any problems.

## 2011-01-06 NOTE — Telephone Encounter (Signed)
Talked to pharmacist at [facility] and they do have relion humulin 70/30  ( also have humulin N and R )

## 2011-01-06 NOTE — Telephone Encounter (Signed)
I can do, but this is not going to be exact.  Is on 72 units of Lantus-  Will need to change to 70/30 35 units q am, before breakfast 30 units sq before supper, and follow up here with CBG reports (she should check tid) in one week--please schedule her a visit

## 2011-01-06 NOTE — Telephone Encounter (Signed)
Pt called and asked to change her lantus insulin to "Humulin" so she can get at [facility]  She can't afford the lantus and [facility] told her this was affordable. Will you make this change?

## 2011-01-07 LAB — GLUCOSE, CAPILLARY: Glucose-Capillary: 299 mg/dL — ABNORMAL HIGH (ref 70–99)

## 2011-01-10 LAB — GLUCOSE, CAPILLARY: Glucose-Capillary: 199 mg/dL — ABNORMAL HIGH (ref 70–99)

## 2011-01-11 LAB — GLUCOSE, CAPILLARY: Glucose-Capillary: 165 mg/dL — ABNORMAL HIGH (ref 70–99)

## 2011-01-14 ENCOUNTER — Other Ambulatory Visit: Payer: Self-pay | Admitting: *Deleted

## 2011-01-14 ENCOUNTER — Ambulatory Visit: Payer: Self-pay | Admitting: Internal Medicine

## 2011-01-14 DIAGNOSIS — I10 Essential (primary) hypertension: Secondary | ICD-10-CM

## 2011-01-14 NOTE — Telephone Encounter (Signed)
Pharmacy can no longer obtain Cozaar.  Will you consider Avapro, Diovan, Micardis or Benicar as an alternative.  All can be provided at no charge.

## 2011-01-15 MED ORDER — LOSARTAN POTASSIUM 50 MG PO TABS: 50.0000 mg | ORAL_TABLET | Freq: Every day | ORAL | Status: DC

## 2011-01-15 NOTE — Telephone Encounter (Signed)
OK to call in Avapro 150 mg #30 one po a day (1 po qam) 6 rf

## 2011-01-19 ENCOUNTER — Ambulatory Visit (AMBULATORY_SURGERY_CENTER): Payer: Self-pay | Admitting: *Deleted

## 2011-01-19 VITALS — Ht 61.0 in | Wt 217.0 lb

## 2011-01-19 DIAGNOSIS — Z86010 Personal history of colonic polyps: Secondary | ICD-10-CM

## 2011-01-19 DIAGNOSIS — K921 Melena: Secondary | ICD-10-CM

## 2011-01-19 MED ORDER — PEG-KCL-NACL-NASULF-NA ASC-C 100 G PO SOLR: ORAL | Status: DC

## 2011-01-27 ENCOUNTER — Ambulatory Visit (INDEPENDENT_AMBULATORY_CARE_PROVIDER_SITE_OTHER): Payer: Self-pay | Admitting: Ophthalmology

## 2011-01-27 DIAGNOSIS — I209 Angina pectoris, unspecified: Secondary | ICD-10-CM

## 2011-01-27 DIAGNOSIS — R609 Edema, unspecified: Secondary | ICD-10-CM

## 2011-01-27 DIAGNOSIS — R55 Syncope and collapse: Secondary | ICD-10-CM

## 2011-01-27 DIAGNOSIS — E119 Type 2 diabetes mellitus without complications: Secondary | ICD-10-CM

## 2011-01-27 DIAGNOSIS — I10 Essential (primary) hypertension: Secondary | ICD-10-CM

## 2011-01-27 LAB — POCT GLYCOSYLATED HEMOGLOBIN (HGB A1C): Hemoglobin A1C: 7.3

## 2011-01-27 LAB — GLUCOSE, CAPILLARY: Glucose-Capillary: 84 mg/dL (ref 70–99)

## 2011-01-27 MED ORDER — INSULIN GLARGINE 100 UNIT/ML ~~LOC~~ SOLN: 56.0000 [IU] | Freq: Every day | SUBCUTANEOUS | Status: DC

## 2011-01-27 MED ORDER — INSULIN NPH ISOPHANE & REGULAR (70-30) 100 UNIT/ML ~~LOC~~ SUSP: SUBCUTANEOUS | Status: DC

## 2011-01-27 NOTE — Assessment & Plan Note (Signed)
I did recommend to the patient, that the cause of the frequency of her spells, that she should be hospitalized for further workup, and a stabilization of her insulin regimen. The patient refused this and stated that she would go home today. I also informed patient that she should not drive any equipment until we determine the cause of her symptoms however she did drive here today and refused to allow clinic staff to call her a taxi or arrange other transportation. With regards to the differential diagnosis, this is most likely related to her blood sugar, I do not think it is likely that this is related to an arrhythmia, TIAs, or seizure. At this point time, I will decrease the patient's insulin to try to reduce the risk of further syncopal spells. I do think it is quite unusual that the patient is able to be awakened from her spells as this is not really consistent with syncope due to hypoglycemia or other serious etiologies. If the patient continues to have symptoms even with the adjustment in her insulin, she will require hospitalization for further workup.

## 2011-01-27 NOTE — Patient Instructions (Signed)
You should not drive a car or an other machinery until we determine the cause of your symptoms.  I am referring you to a cardiologist for your angina.  I am decreasing your dose of insulin as this it the most likely cause of your spells.  Please go to the ED or call the clinic if you have symptoms again.

## 2011-01-27 NOTE — Assessment & Plan Note (Addendum)
This is previously been under good control, and the patient is currently taking 7030 insulin. She is not taking Lantus because it was too expensive, and does all her records state that the patient should be taking 35 units in the a.m., and 30 units at night, the patient has been taking 42 units in the a.m. and 30 units at night. Although it will result in worsening of her diabetes control, given her syncope, I am reducing the patient's insulin dose to 35 units in the a.m., and 25 units at night. I will check an A1c today. The patient will need followup in the next month to followup on her CBGs.  Lab Results  Component Value Date   HGBA1C  Value: 7.2 (NOTE)                                                                       According to the ADA Clinical Practice Recommendations for 2011, when HbA1c is used as a screening test:   >=6.5%   Diagnostic of Diabetes Mellitus           (if abnormal result  is confirmed)  5.7-6.4%   Increased risk of developing Diabetes Mellitus  References:Diagnosis and Classification of Diabetes Mellitus,Diabetes Care,2011,34(Suppl 1):S62-S69 and Standards of Medical Care in         Diabetes - 2011,Diabetes Care,2011,34  (Suppl 1):S11-S61.* 10/29/2010   HGBA1C 7.1 07/02/2010   HGBA1C 7.5 04/29/2010   Lab Results  Component Value Date   MICROALBUR 1.16 10/29/2010   LDLCALC  Value: 135        Total Cholesterol/HDL:CHD Risk Coronary Heart Disease Risk Table                     Men   Women  1/2 Average Risk   3.4   3.3  Average Risk       5.0   4.4  2 X Average Risk   9.6   7.1  3 X Average Risk  23.4   11.0        Use the calculated Patient Ratio above and the CHD Risk Table to determine the patient's CHD Risk.        ATP III CLASSIFICATION (LDL):  <100     mg/dL   Optimal  829-562  mg/dL   Near or Above                    Optimal  130-159  mg/dL   Borderline  130-865  mg/dL   High  >784     mg/dL   Very High* 6/96/2952   CREATININE 0.89 12/31/2010

## 2011-01-27 NOTE — Progress Notes (Signed)
Subjective:    Patient ID: [name], female    DOB: 1951/05/04, 60 y.o.   MRN: [id number]  HPI  This is a 60 year old female with a past medical history significant for diabetes, hypertension and morbid obesity, who presents because of syncopal symptoms. The patient was last seen in our clinic on March 1 by Dr. Reche Dixon who performed a Kenalog injection for trochanteric bursitis. The patient also went to the ED on March 29 for chest pain, with care enzymes were negative, as was chest x-ray. The patient chose to leave AMA. The patient states that since her last visit, she has had multiple episodes of passing out, the first of which occurred on the day that she left clinic. The patient states that she was in the drive through at [facility], when she passed out. The patient was awakened by staff, given a drink, and her symptoms improved. The patient has had several of these episodes since that time and states that she's been hypoglycemic during each of them, though her blood sugar has only been as low as 60. The patient states that she had a CBG of 20 but this was not recorded by her meter. The patient also states that she is always able to be awakened during these spells. The patient continues to drive at this time. The patient's last episode occurred on Saturday, and she was awakened in the morning by her daughter. The patient's symptoms are associated with visual changes, but no shaking, biting her tongue, or post ictal state. The patient denies having any lasting neurologic deficits or any unilateral weakness or numbness during the spells.  The patient also complains of daily angina that occurs with any mild exertion, and has a history of nonobstructive coronary artery disease, but has not been seen by cardiologist recently.  Review of Systems  Constitutional: Negative for fever and chills.  Respiratory: Negative for cough and shortness of breath.   Cardiovascular: Positive for chest pain. Negative  for palpitations.  Gastrointestinal: Negative for vomiting, diarrhea and constipation.       Objective:   Physical Exam  Constitutional: She appears well-developed and well-nourished.  HENT:  Head: Normocephalic and atraumatic.  Eyes: Pupils are equal, round, and reactive to light.  Cardiovascular: Normal rate, regular rhythm and intact distal pulses.  Exam reveals no gallop and no friction rub.   No murmur heard. Pulmonary/Chest: Effort normal and breath sounds normal. She has no wheezes. She has no rales.  Abdominal: Soft. Bowel sounds are normal. She exhibits no distension. There is no tenderness.  Musculoskeletal: Normal range of motion.  Neurological: She is alert. No cranial nerve deficit.  Skin: No rash noted.        Current Outpatient Prescriptions on File Prior to Visit  Medication Sig Dispense Refill  . amLODipine (NORVASC) 10 MG tablet Take 1 tablet (10 mg total) by mouth daily.  30 tablet  30  . aspirin 81 MG EC tablet Take 81 mg by mouth daily.        Marland Kitchen atorvastatin (LIPITOR) 10 MG tablet Take 40 mg by mouth daily.        . chlorpheniramine-phenylephrine-dextromethorphan (RONDEC DM) 12.02-04-14 MG/5ML SYRP Take by mouth. Take 1-2 teaspoons every 6-8 hours as needed for cough       . cloNIDine (CATAPRES) 0.2 MG tablet Take 1 tablet (0.2 mg total) by mouth 2 (two) times daily.  60 tablet  11  . cyclobenzaprine (FLEXERIL) 5 MG tablet Take 5 mg by mouth 3 (  three) times daily as needed.        . docusate sodium (COLACE) 100 MG capsule Take 100 mg by mouth daily.        Marland Kitchen gabapentin (NEURONTIN) 300 MG capsule Take 1 capsule (300 mg total) by mouth 3 (three) times daily.  90 capsule  11  . glucose blood (BLOOD GLUCOSE TEST STRIPS) test strip Freestyle test strips- use as instructed to check blood sugar  100 each  12  . hydrochlorothiazide 25 MG tablet Take 1 tablet (25 mg total) by mouth daily.  90 tablet  12  . ibuprofen (ADVIL,MOTRIN) 800 MG tablet Take 800 mg by mouth every 6  (six) hours as needed. pain       . insulin glargine (LANTUS) 100 UNIT/ML injection Inject 72 Units into the skin at bedtime.  10 mL  12  . insulin NPH-insulin regular (NOVOLIN 70/30) (70-30) 100 UNIT/ML injection 35 units SQ in morning before breakfast 30 units SQ in evening before supper  30 mL  3  . Insulin Syringe-Needle U-100 (INSULIN SYRINGE .5CC/28G) 28G X 1/2" 0.5 ML MISC as directed.        . Lancets 30G MISC Use to check blood sugar twice a day       . loratadine (CLARITIN) 10 MG tablet Take 10 mg by mouth 2 (two) times daily as needed. For allergies       . losartan (COZAAR) 50 MG tablet Take 1 tablet (50 mg total) by mouth daily.  30 tablet  11  . metFORMIN (GLUCOPHAGE) 1000 MG tablet Take 1 tablet (1,000 mg total) by mouth 2 (two) times daily with meals.  60 tablet  11  . metoprolol (TOPROL-XL) 100 MG 24 hr tablet Take 1 tablet (100 mg total) by mouth daily.  30 tablet  11  . Multiple Vitamins-Minerals (CENTRUM PO) Take 1 tablet by mouth daily.        . peg 3350 powder (MOVIPREP) 100 G SOLR MOVI PREP take as directed  1 kit  0  . polyethylene glycol (MIRALAX / GLYCOLAX) packet Take 34 g by mouth daily.        . potassium chloride SA (K-DUR,KLOR-CON) 20 MEQ tablet Take 1 tablet (20 mEq total) by mouth daily.  30 tablet  11    Past Medical History  Diagnosis Date  . Chest pain 11/07    Caridac cath showed no ishecmia, no significant disease.  EF 55% by echo .  . Diabetes mellitus type II   . Hypertension   . GERD (gastroesophageal reflux disease)   . Hyperlipidemia   . Obesity     Morbid  . Colonic polyp 1995    Per pt, colonoscopy in 1995, Repeat colonoscopy in 12/04 by Dr. Loreta Ave showed isolated diverticulum in mid transverese colon, small non bleeding internal hemorrhoids, repeat colonoscopy Q5 years.   . OSA (obstructive sleep apnea) 5.15.08    Moderate per sleep study  . Hypertensive crisis 3/11    Admitted to [facility]  . Bilateral leg edema   . Chronic low back pain  08/07/10    Per MRI: Spondylosis most notable at L4-5 where advanced facet,  Moderate to advanced facet degenerative disease L5-S1.  Marland Kitchen Allergy     seasonal  . CHF (congestive heart failure)     since radiation therapy  . Breast cancer 2006    Left breast, invasive papillary CA, Ductal CA in Situ.   . Constipation      Assessment & Plan:

## 2011-01-27 NOTE — Assessment & Plan Note (Signed)
The patient states that she has angina daily, only with exertion such as walking up stairs. Her angina radiates to the left arm, and is improved by rest. The patient has had a previous catheter which showed nonobstructive coronary artery disease, but I will refer her to cardiology today for further workup and most likely a stress test. The patient angina sounds stable at this time, and has not changed in frequency or severity.

## 2011-01-27 NOTE — Assessment & Plan Note (Signed)
This is well controlled on the patient's current blood pressure regimen, as well as with TED hose.

## 2011-02-02 ENCOUNTER — Ambulatory Visit (AMBULATORY_SURGERY_CENTER): Payer: Self-pay | Admitting: Gastroenterology

## 2011-02-02 ENCOUNTER — Encounter: Payer: Self-pay | Admitting: Gastroenterology

## 2011-02-02 VITALS — BP 165/119 | HR 108 | Temp 97.8°F | Resp 16 | Ht 61.0 in | Wt 214.0 lb

## 2011-02-02 DIAGNOSIS — K921 Melena: Secondary | ICD-10-CM

## 2011-02-02 DIAGNOSIS — K2970 Gastritis, unspecified, without bleeding: Secondary | ICD-10-CM

## 2011-02-02 DIAGNOSIS — D126 Benign neoplasm of colon, unspecified: Secondary | ICD-10-CM

## 2011-02-02 DIAGNOSIS — R195 Other fecal abnormalities: Secondary | ICD-10-CM

## 2011-02-02 DIAGNOSIS — K625 Hemorrhage of anus and rectum: Secondary | ICD-10-CM

## 2011-02-02 DIAGNOSIS — D649 Anemia, unspecified: Secondary | ICD-10-CM

## 2011-02-02 DIAGNOSIS — Z86010 Personal history of colonic polyps: Secondary | ICD-10-CM

## 2011-02-02 LAB — GLUCOSE, CAPILLARY
Glucose-Capillary: 125 mg/dL — ABNORMAL HIGH (ref 70–99)
Glucose-Capillary: 95 mg/dL (ref 70–99)

## 2011-02-02 MED ORDER — SODIUM CHLORIDE 0.9 % IV SOLN: 500.0000 mL | INTRAVENOUS | Status: DC

## 2011-02-02 NOTE — Patient Instructions (Signed)
Please see blue and green instruction sheets.

## 2011-02-03 ENCOUNTER — Telehealth: Payer: Self-pay | Admitting: *Deleted

## 2011-02-03 NOTE — Telephone Encounter (Signed)
Phone busy.

## 2011-02-05 ENCOUNTER — Encounter: Payer: Self-pay | Admitting: Internal Medicine

## 2011-02-05 DIAGNOSIS — C50312 Malignant neoplasm of lower-inner quadrant of left female breast: Secondary | ICD-10-CM | POA: Insufficient documentation

## 2011-02-05 DIAGNOSIS — I10 Essential (primary) hypertension: Secondary | ICD-10-CM | POA: Insufficient documentation

## 2011-02-05 DIAGNOSIS — J302 Other seasonal allergic rhinitis: Secondary | ICD-10-CM | POA: Insufficient documentation

## 2011-02-05 DIAGNOSIS — K635 Polyp of colon: Secondary | ICD-10-CM | POA: Insufficient documentation

## 2011-02-05 DIAGNOSIS — D509 Iron deficiency anemia, unspecified: Secondary | ICD-10-CM | POA: Insufficient documentation

## 2011-02-05 DIAGNOSIS — E785 Hyperlipidemia, unspecified: Secondary | ICD-10-CM | POA: Insufficient documentation

## 2011-02-05 DIAGNOSIS — Z170 Estrogen receptor positive status [ER+]: Secondary | ICD-10-CM

## 2011-02-05 DIAGNOSIS — G8929 Other chronic pain: Secondary | ICD-10-CM | POA: Insufficient documentation

## 2011-02-05 DIAGNOSIS — I2510 Atherosclerotic heart disease of native coronary artery without angina pectoris: Secondary | ICD-10-CM | POA: Insufficient documentation

## 2011-02-10 ENCOUNTER — Other Ambulatory Visit: Payer: Self-pay | Admitting: *Deleted

## 2011-02-10 MED ORDER — VALSARTAN 160 MG PO TABS: 160.0000 mg | ORAL_TABLET | Freq: Every day | ORAL | Status: DC

## 2011-02-10 NOTE — Telephone Encounter (Signed)
Please ask the pt to sch appt about two weeks after start the new med for BP check. Can be with me if I have opening or with [facility] resident. Thanks

## 2011-02-10 NOTE — Telephone Encounter (Signed)
Pharmacy does not carry Cozaar 50 mg.  Would like to change to Avapro, Diovan, Micardis or Benicar as an alternative.  Can be changed at no charge to the patient.

## 2011-02-16 NOTE — Assessment & Plan Note (Signed)
[facility]                            CARDIOLOGY OFFICE NOTE   CHASITIE, PASSEY                      MRN:          [id number]  DATE:09/21/2007                            DOB:          [birth year]    PRIMARY CARE PHYSICIAN:  Yetta Barre, M.D.   REASON FOR PRESENTATION:  Evaluate patient with recent hospitalization  for chest pain.   HISTORY OF PRESENT ILLNESS:  The patient is a 60 years old.  She was  hospitalized on December 4 with chest discomfort.  This was felt to be  nonanginal and she ruled out for myocardial infarction.  She was also  managed for hypertension and diabetes not well-controlled on that  admission.  She had her dose of Avapro increased, metformin was  increased and Toprol was increased to 50 mg daily.  She did have an  echocardiogram, which demonstrated mildly increased left ventricular  wall thickness with an EF of 55%.  A BNP was 34.  She was sent here for  further evaluation.   She says she feels better.  She says she is now breathing relatively  well.  She might get chest discomfort doing such activities such as  carrying in the groceries or walking a moderate distance through the  mall.  However, this has not changed from the time of her  catheterization in 2007.  At that time, she had disease as described  below.  This has been an unchanged pattern.   PAST MEDICAL HISTORY:  Nonobstructive coronary disease (LAD luminal  irregularities, ramus intermedius superior branch 70% stenosis,  circumflex 50% stenosis, marginal 50% stenosis, catheterization November  2007), diabetes, hypertension, hyperlipidemia, Bell's palsy, obesity,  breast cancer, status post lumpectomy, lymph node resection, radiation  therapy.   MEDICATIONS:  1. Amaryl 4 mg b.i.d.  2. Avapro 300 mg daily.  3. Aspirin 81 mg daily.  4. Lipitor 40 mg daily.  5. K-Dur 20 mEq daily.  6. Metformin 1000 mg b.i.d.  7. Lasix 80 mg daily.  8. Toprol 50 mg  daily.  9. Zyrtec 10 mg daily.  10.Protonix 40 mg b.i.d.   REVIEW OF SYSTEMS:  As stated in the HPI, and otherwise negative for  other systems.   PHYSICAL EXAMINATION:  The patient is in no acute distress.  Blood  pressure 140/79, heart rate 86 and regular.  Weight 217 pounds, body  mass index 40.  HEENT:  Eyelids unremarkable.  Pupils equal, round and reactive to  light.  Fundi not visualized.  Oral mucosa unremarkable.  NECK:  No jugular venous distention at 45 degrees.  Carotid upstroke  brisk and symmetric.  No bruits, no thyromegaly.  LYMPHATICS:  No cervical, axillary or inguinal adenopathy.  LUNGS:  Clear to auscultation bilaterally.  BACK:  No costovertebral angle tenderness.  CHEST:  Unremarkable.  HEART:  PMI not displaced or sustained.  S1 and S2 within normal limits.  No S3, no S4, no clicks, no rubs, no murmurs.  ABDOMEN:  Morbidly obese, positive bowel sounds, normal in frequency and  pitch.  No bruits, no rebound, no guarding, no midline  pulsatile mass,  no hepatomegaly, no splenomegaly.  SKIN:  No rashes, no nodules.  EXTREMITIES:  Two-plus pulses, no edema.   EKG:  Sinus rhythm, rate 87, axis within normal limits.  Intervals:  Prolonged QT, no acute STT-wave changes.   ASSESSMENT AND PLAN:  1. Chest discomfort:  The patient's chest discomfort is atypical.  She      has had no objective evidence of ischemia.  Her pain is unchanged      from the time of her catheterization in November 2007, when she had      nonobstructive disease in her major vessels.  At this point, I do      not think further cardiovascular testing is suggested, especially      since her symptoms are less than they were two weeks ago.  2. Hypertension:  Her blood pressure is better controlled on the      increased regimen.  This may be managed per her primary care      physician.  3. Diabetes:  Again, per her primary care physician.  She is on a      higher regimen, understands diet  control.  4. Prolonged QT:  The patient does have a prolonged QT on this EKG.      She has had no evidence of syncope.  This was not present      previously.  I do not see any drugs that would be implicated.  This      needs to be repeated and she needs to avoid any QT-prolonging      drugs.  5. Followup:  I will see the patient back in about six months or      sooner if needed.     Rollene Rotunda, MD, [facility]  Electronically Signed    JH/MedQ  DD: 09/21/2007  DT: 09/22/2007  Job #: [id number]   cc:   Yetta Barre, M.D.

## 2011-02-16 NOTE — Discharge Summary (Signed)
[name], [name]               ACCOUNT NO.:  [id number]   MEDICAL RECORD NO.:  [id number]          PATIENT TYPE:  INP   LOCATION:  4731                         FACILITY:  [facility]   PHYSICIAN:  Manning Charity, MD     DATE OF BIRTH:  05-17-1951   DATE OF ADMISSION:  09/07/2007  DATE OF DISCHARGE:  09/08/2007                               DISCHARGE SUMMARY   DISCHARGE DIAGNOSES:  1. Chest pain.  2. Left arm pain.  3. Hypertension.  4. Diabetes.  5. Hyperlipidemia.  6. Cardiomyopathy.  7. Gastroesophageal reflux disease.  8. History of left breast cancer with partial mastectomy,      chemoradiation in 2006.   Patient is discharged on the following medications:  1. Amaryl 4 mg 1 tablet twice a day.  2. Avapro 300 mg 1 tablet daily.  3. Aspirin 81 mg 1 tablet daily.  4. Lipitor 40 mg 1 tablet daily.  5. K-Dur 20 mEq 1 tablet daily.  6. Metformin 1000 mg 1 tablet twice a day.  7. Lasix 80 mg 1 tablet daily.  8. Toprol XL 25 mg 1 tablet daily.  9. Zyrtec 10 mg daily.  10.Protonix 40 mg 1 tablet twice a day.   Patient discharged in stable condition.  She will be following up with  Dr. Clent Ridges on December 11th at 3:15 p.m. in the [facility] and with Dr. Antoine Poche on December 18th at 10:45.   PROCEDURES PERFORMED DURING THIS HOSPITALIZATION:  1. On September 08, 2007:  Portable chest x-ray, no acute findings.  2. On September 07, 2007:  Upper extremity Doppler of the left arm shows      no evidence of DVT or superficial thrombosis of left upper      extremity.  There were no complications during this      hospitalization.   ADMITTING H&P:  [name] is a 60 year old African American female with  history of nonobstructive coronary artery disease shown by cath,  November 2007, who has had 3 to 4 days of nagging left chest pain she  describes as burning, sharp, radiating around to the left back and side  and down the left arm.  She has tried aspirin, Tums, and other  antacids  with no relief.  Pain is sometimes  increased with activity, but occurs  also at rest.  Is often accompanied by shortness of breath. She endorses  nausea a day prior to admission with no emesis.  Positive for lower  extremity edema and shortness of air which she attributes to fluid  retention.  She has been dizzy and presyncopal without blacking out  several times in the past few days.  Ms. Midkiff checks her blood pressure  at home and it has been in the high 170s to 200s for the past 3 months.  She also checks her CBGs frequently and reports measurements ranging  from 140s to 280s.  She reports adhering to a diet which is low carbs  and low sodium, taking her medications as prescribed, and walking every  day.  She feels that her exercise tolerance  has been decreasing for the  past 3 months.   EXAM:  Temperature 98.2.  Blood pressure 172/94.  Pulse 81.  Respirations 16.  Patient satting 99% on room air.  GERNERAL: patient not in acute distress.  Mucous membranes moist.  NECK:  Supple with good range of motion.  No bruits.  No  lymphadenopathy.  Some tenderness in the submandibular area.  PULMONARY EXAM:  Clear to auscultation bilaterally.  CARDIOVASCULAR EXAM:  Regular rate and rhythm.  No murmurs, rubs, or  gallops.  ABDOMINAL EXAM:  Bowel sounds positive.  Abdomen soft, nontender.  It is  distended.  There is no edema of either lower extremity with 2+ pulses bilaterally.  SKIN:  Warm and dry.  Strength is 5/5 throughout and symmetric.  Cranial nerves intact.   LABS ON ADMISSION:  White blood cells 6.0, hemoglobin 11.3, hematocrit  33.7, platelets 241 with an MCV of 84.7, PT 14.7, INR 1.1, sodium 136,  potassium 3.4, chloride 104, bicarb 28, BUN 10, creatinine 1.0, glucose  218, total bili 0.7, alk phos 58, AST 18, ALT 22, total protein 6.9,  albumin 3.4, calcium 8.9.  Cardiac panel negative x3.  BNP 39.   HOSPITAL COURSE BY PROBLEM:  1. Chest pain.  Initial concern for  acute coronary syndrome.  Cardiac      enzymes were negative x3.  EKG showed question of new A-V block,      first degree, but no signs of ischemia.  Chest pain was      intermittent in nature and reduced by day of discharge to 1/10 to      3/10.  I do not feel this is acute coronary syndrome.  However,      given the patient's decreased exercise tolerance, she is being      scheduled with her cardiologist later on this month.  2. Left arm pain.  Initially worrysome with complaint of chest pain.      Given her history of breast cancer and surgery, chemoradiation on      the left side also suspicion for lymphedema.  Patient reports      subjective swelling of the left arm which was not noted on exam.      Upper extremity Doppler was negative for DVT.  Pulses 2+ in the      left extremity.  No neurological defects.  No etiology determined      for the left arm pain.  3. Hypertension.  By the 2nd day of this admission, blood pressure was      132/66 on patient's home medications.  4. Diabetes.  A1c in September of 2008 is 7.3.  This issue can be      managed in the outpatient setting.  A1c could be repeated at her      next appointment.  Patient will go home on her previous regimen of      metformin.  5. Hyperlipidemia, continue Lipitor.  6. Cardiomyopathy shown by 2D echo, August 30, 2006.  Patient has an      ejection fraction of 55%.  Left ventricular wall thickness mildly      increased.  This is not constituted cardiomyopathy, only shows      signs of chronic hypertension.  BNP at admission 34.  There is no      sign of CHF.  Patient will continue on her Lasix.  7. GERD.  Patient was on Protonix in the hospital, takes multiple over-  the-counter antacids at home, as well as Protonix.  She should      continue on her home regimen.  She should increase her home regimen      to twice a day, Protonix.   DAY OF DISCHARGE:  Temperature 97.1.  Pulse 62.  Respirations 18.  Patient  satting 97% on room air.  Blood pressure 167/85 at time of  discharge.  Patient reports minimal chest pain at time of discharge.   LABS MORNING OF DISCHARGE:  White blood cell count 5.8, hemoglobin 11.6,  hematocrit 35.3, platelets 253, sodium 139, potassium 3.6, chloride 101,  bicarb 28, BUN 12, creatinine 0.9, glucose 250.      Elby Showers, MD  Electronically Signed      Manning Charity, MD  Electronically Signed    CW/MEDQ  D:  09/13/2007  T:  09/14/2007  Job:  [id number]   cc:   Rollene Rotunda, MD, [facility]  Elby Showers, MD

## 2011-02-16 NOTE — Procedures (Signed)
[name], [name]               ACCOUNT NO.:  [id number]   MEDICAL RECORD NO.:  [id number]          PATIENT TYPE:  OUT   LOCATION:  SLEEP CENTER                 FACILITY[facility]   PHYSICIAN:  Clinton D. Maple Hudson, MD, FCCP, FACPDATE OF BIRTH:  [birth year]1950   DATE OF STUDY:  02/16/2007                            NOCTURNAL POLYSOMNOGRAM   REFERRING PHYSICIAN:   REFERRING PHYSICIAN:  Yetta Barre, M.D.   INDICATION FOR STUDY:  Hypersomnia with sleep apnea.  Epworth's score  was not submitted.  Height 5 feet 1 inch, weight 275 pounds.   HOME MEDICATIONS:  Listed and reviewed.   SLEEP ARCHITECTURE:  Total sleep time 304 minutes with sleep efficiency  79%.  Stage I was 5%, stage II 53%, stage III and IV 8%, REM 33% of  total sleep time.  Sleep latency 16 minutes, REM latency 64 minutes,  awake after sleep onset 65 minutes, arousal index 4.7, melatonin was  taken at 9:50 p.m.   RESPIRATORY DATA:  Split study protocol.  Apnea/hypopnea index (AHI,  RDI) 35.6 obstructive events per hour indicating moderate obstructive  sleep apnea/hypopnea syndrome before CPAP.  There were 55 obstructive  apneas and 18 hypopneas before CPAP.  Events were not positional.  REM  AHI 40.6.  CPAP was titrated to 16 CWP, AHI 3.6 per hour.  A medium full  face comfort gel mask was used with heated humidifier.   OXYGEN DATA:  Moderate to loud snoring with oxygen desaturation to a  nadir of 65% before CPAP.  With CPAP control saturation held 94-95% on  room air.   CARDIAC DATA:  Normal sinus rhythm.   MOVEMENT/PARASOMNIA:  Occasional limb jerk, insignificant.   IMPRESSION/RECOMMENDATIONS:  1. Moderate obstructive sleep apnea/hypopnea syndrome, AHI 35.6 per      hour with nonpositional events.  Moderate to loud snoring and      oxygen desaturation to a nadir of 65%.  2. Successful CPAP titration to 16 CWP, AHI 3.6 per hour.  A medium      full face comfort gel mask was chosen, heated humidifier.      Clinton D. Maple Hudson, MD, [facility], FACP  Diplomate, Biomedical engineer of Sleep Medicine  Electronically Signed     CDY/MEDQ  D:  02/19/2007 12:28:35  T:  02/20/2007 00:08:16  Job:  [id number]

## 2011-02-16 NOTE — H&P (Signed)
[name], [name]               ACCOUNT NO.:  [id number]   MEDICAL RECORD NO.:  [id number]           PATIENT TYPE:   LOCATION:                                 FACILITY:   PHYSICIAN:  Christell Faith, MD   DATE OF BIRTH:  [birth year]   DATE OF ADMISSION:  03/13/2008  DATE OF DISCHARGE:                              HISTORY & PHYSICAL   PRIMARY CARE PHYSICIAN:  Dr. Liliane Channel.   CHIEF COMPLAINT:  Chest pain.   HISTORY OF PRESENT ILLNESS:  This is a 60 year old African American  female with a history of nonobstructive coronary artery disease, who  developed atypical chest pain tonight after work at approximately 6 p.m.  while at rest.  The pain is similar to her prior documented chest pain.  It is described as sharp and fleeting.  She thought it was indigestion  at first, but it recurred several times throughout the evening, so she  came to be evaluated in the emergency department.  The pain did resolve  with nitroglycerin.   PAST MEDICAL HISTORY:  1. Hypertension.  2. Diabetes mellitus type 2.  3. Catheterization in November 2007, revealed 30% ostial left main,      70% branch ramus, minor irregularities in the LAD, and 50% mid      circumflex.  4. Echocardiogram in November 2007, showed LV ejection fraction of      55%.  5. Hyperlipidemia.  6. Breast cancer, status post lumpectomy and radiation therapy in      2006.  7. Bell's palsy.   SOCIAL HISTORY:  Lives in [place] with her daughter.  She works in  Social research officer, government for [employer].  Nonsmoker and nondrinker.   FAMILY HISTORY:  Mother died of lung cancer at age 28.  Father died of  diabetes.  Sister died of breast cancer in her 30s.   ALLERGIES:  BENADRYL AND PERCOCET.   MEDICINES:  1. Lasix 40 mg p.o. b.i.d.  2. Aspirin 81 mg p.o. daily.  3. Avapro 300 mg p.o. daily.  4. Toprol-XL 50 mg p.o. q.a.m. and 25 mg p.o. q.p.m.  5. Lipitor 40 mg p.o. daily.  6. KCL 20 mEq p.o. daily.  7. Amaryl 2 mg p.o. b.i.d.  8.  Metformin 1 g p.o. b.i.d.  9. Protonix 40 mg p.o. b.i.d.  10.Zyrtec daily.  11.Multivitamin daily.  12.B12 daily.  13.B6 daily.   REVIEW OF SYSTEMS:  Positive for headaches, chest pain, dyspnea on  exertion, frequent falling spells, weakness, numbness, and nausea.  Otherwise, the balance of 14 systems is reviewed and is negative.   PHYSICAL EXAMINATION:  VITAL SIGNS:  Temperature 98, pulse ranging from  69-82, respiratory rate 14, blood pressure initially 169/93, two hours  later 121/70, saturation 99% on room air.  GENERAL:  This is a pleasant obese African American female, in no acute  distress.  HEENT:  Head is normocephalic and atraumatic.  Pupils are equal, round,  and react to light.  Extraocular movements are intact.  Sclerae are  clear.  Mucous membranes are moist.  Dentition is fair.  NECK:  Supple.  No lymphadenopathy.  Neck veins are flat.  No carotid  bruits.  CARDIAC:  Normal rate.  Regular rhythm.  No murmurs or gallops.  Heart  tones are distant.  LUNGS:  Clear to auscultation bilaterally without wheezing or rales.  ABDOMEN:  Obese, soft, nontender, and nondistended.  EXTREMITIES:  No clubbing, cyanosis, or edema.  No rash.  MUSCULOSKELETAL:  No acute joint deformities or pain.  NEUROLOGIC:  5/5 strength all four extremities.  No overt deficits  noted.   DIAGNOSTIC TESTS:  Chest x-ray shows no active cardiopulmonary disease.  A 12-lead EKG shows sinus rhythm with a rate of 88 beats per minute with  nonspecific T-wave flattening.  PR intervals 204 and corrected QT  interval 463 msec.   LABORATORY DATA:  White blood cell 7.5 and hemoglobin 11.1.  Sodium 141,  potassium 3.7, BUN 18, creatinine 1, and glucose 286.  Point-of-care  troponin negative.   IMPRESSION:  A 60 year old African American female with atypical chest  pain with a history of moderate nonobstructive coronary artery disease.   PLAN:  1. Admit to observation, Dr. Antoine Poche.  Monitor on telemetry  and rule      out for myocardial infarction by cycling serial EKGs and cardiac      enzymes.  2. Diabetes control with her home medicines with the exception that we      will hold metformin while she is in the hospital.  3. Blood pressure is under better control with the addition of      nitroglycerin.  Continue her other current medicines and consider      adding Imdur for blood pressure and chest pain control.  4. She will probably need an outpatient 2-day Myoview test.  5. Breast cancer surveillance is per her primary care physician and      oncologist.  6. To rule out venous thromboembolism, we will check a D-dimer.  7. Continue her aspirin, Avapro, Toprol-XL, Lipitor, and Amaryl as      described above.  8. DVT prophylaxis with Lovenox.      Christell Faith, MD  Electronically Signed    NDL/MEDQ  D:  03/13/2008  T:  03/13/2008  Job:  [id number]

## 2011-02-16 NOTE — Discharge Summary (Signed)
[name], WEICHERT               ACCOUNT NO.:  [id number]   MEDICAL RECORD NO.:  [id number]          PATIENT TYPE:  OBV   LOCATION:  6527                         FACILITY:  [facility]   PHYSICIAN:  Bettey Mare. Lawrence, NPDATE OF BIRTH:  02-14-1951   DATE OF ADMISSION:  03/12/2008  DATE OF DISCHARGE:  03/13/2008                               DISCHARGE SUMMARY   PRIMARY CARDIOLOGIST:  Rollene Rotunda, MD, [facility].   PRIMARY CARE PHYSICIAN:  Silvano Rusk, MD   PROCEDURES PERFORMED DURING HOSPITALIZATION:  None.   FINAL DISCHARGE DIAGNOSES:  1. Noncardiac chest pain and a history of the patient with known      nonobstructive coronary artery disease.  2. Diabetes.  3. Hypertension.  4. History of breast cancer.  5. Bells palsy.   HOSPITAL COURSE:  This is a 60 year old female with a history of  nonobstructive CAD who developed chest pain after work on day of  admission around 6 p.m.  She notes pain prior documentation which she  described as sharp and fleeting.  She thought it was indigestion but it  reoccurred several times so she came to be evaluated in the emergency  room.  The patient was seen and examined by Dr. Lalla Brothers, fellow for Dr.  Rollene Rotunda and admitted to rule out myocardial infarction in the  setting of a known CAD.  She was started on Lovenox and prior  medication.  Prior to admission, the patient's cardiac enzymes were  found to be negative.  The patient had no further complaints of chest  discomfort.  The patient was seen and examined by Dr. Arvilla Meres  later that day and found to be stable.  Her troponins were found to be  negative x2.  BNP was 63.  D-dimer negative.  Hemoglobin A1c was 8.6.  It was advised to the patient to follow up with her primary care  physician for better diabetic management and also for an outpatient  stress Myoview on discharge.   DISCHARGE MEDICATIONS:  1. Lasix 40 daily.  2. Aspirin 81 mg daily.  3. Avapro 300 mg daily.  4.  Toprol-XL 75 mg daily.  5. Lipitor 40 mg at bedtime.  6. Potassium 20 mEq daily.  7. Amaryl 2 mg daily.  8. Metformin 1000 mg daily.  9. Protonix 40 mg twice a day.  10.Zyrtec 10 mg daily.  11.Vitamin B6 and B12.  12.Multivitamin.  13.Vitamin E daily.  14.Lantus insulin 12 units daily.   ALLERGIES:  To BENADRYL and PERCOCET.   FOLLOWUP APPOINTMENTS:  1. The patient is to follow up with her primary care physician for a      better control of diabetes management.  2. The patient is to follow up with an outpatient stress test for March 18, 2008 at 2:30 p.m.  3. The patient is to follow up with Dr. Rollene Rotunda on March 29, 2008 at 4:15 p.m. for evaluation of stress Myoview and further      discussion of symptoms.   TIME SPENT WITH THE PATIENT TO INCLUDE PHYSICIAN  TIME:  30 minutes.      Bettey Mare. Lyman Bishop, NP     KML/MEDQ  D:  03/13/2008  T:  03/14/2008  Job:  [id number]   cc:   Silvano Rusk, MD

## 2011-02-19 NOTE — Discharge Summary (Signed)
NAMEWILHELMENA, [name]               ACCOUNT NO.:  [id number]   MEDICAL RECORD NO.:  [id number]            PATIENT TYPE:   LOCATION:                                 FACILITY:   PHYSICIAN:  Corinna L. Lendell Caprice, MD     DATE OF BIRTH:   DATE OF ADMISSION:  08/30/2006  DATE OF DISCHARGE:  09/02/2006                               DISCHARGE SUMMARY   DISCHARGE DIAGNOSES:  1. Chest pain, myocardial infarction ruled out.  2. Gastroesophageal reflux disease.  3. Uncontrolled hypertension.  4. Type 2 diabetes.   DISCHARGE MEDICATIONS:  1. She is to increase her Avalide dose to 300 mg/25 mg a day.  2. Continue Amaryl 4 mg p.o. b.i.d.  3. Avandia as previous.  4. Zyrtec 10 mg a day.  5. Multivitamin a day.   At her follow-up appointment with Dr. Liliane Channel, I recommend starting  proton pump inhibitor and a statin, if samples are able to be obtained  or arrangement with a drug company.   CONDITION:  Stable.   CONSULTATIONS:  Cardiology:  [facility] cardiology.   PROCEDURES:  Cardiac catheterization showing moderate nonobstructive  coronary artery disease which was not felt to be related to her chest  pain.  Please see Dr. Arnaldo Natal dictation for full details.   DIET:  Should be low-salt diabetic.   ACTIVITY:  Ad lib.   CONDITION:  Stable.   FOLLOWUP:  With Dr. Liliane Channel December 18 at 9:45 a.m.  She is to have a  repeat BMET done next week to be called to Dr. Joycelyn Rua, per his  recommendations.   PERTINENT LABORATORIES:  D-dimer less than 0.22, hemoglobin A1c 8.3,  total CPK 230, MB fraction 2.1, troponin 0.1.  CBC unremarkable.  PT/PTT  unremarkable.  Complete metabolic panel significant for a potassium of  3.2 serial troponins negative.  BNP 50, cholesterol 218, LDL 148, HDL  52, triglycerides 91.  TSH 3.40.  UA negative.   SPECIAL STUDIES AND RADIOLOGY:  EKG showed normal sinus rhythm with  first-degree AV block.  Echocardiogram showed mildly dilated left  ventricle, ejection fraction  55%, trivial aortic valvular regurgitation.  Chest x-ray showed mild cardiomegaly with vascular congestion and  interstitial prominence, question early interstitial edema.  Stress  Cardiolite showed apical ischemia and apical hypokinesis with ejection  fraction 57%.   HISTORY AND HOSPITAL COURSE:  [name] is a 60 year old black female  who has a history of hypertension, diabetes, obesity who presented with  chest pain somewhat atypical in nature.  Her blood pressure was found to  be 184/97, otherwise normal vital signs.  She had a fairly unremarkable  examination. A D-dimer less than 0.22, a slightly elevated total CPK,  but otherwise negative cardiac enzymes.  EKG showed nothing concerning,  and her chest x-ray as above.   She was admitted to the hospitalist service and ruled out for a MI.  Cardiology performed a stress test which showed apical ischemia;  therefore, she underwent cardiac catheterization by Dr. Joycelyn Rua.  There  was moderate nonobstructive coronary artery disease.  He felt that this  was not  a cause of her chest pain and that the Cardiolite was a false  positive and probably breast attenuation.  He felt that her chest pain  may be due to her uncontrolled hypertension.  She was on Avalide  hydrochlorothiazide 150/12.5 mg as an outpatient, but had noticed that  her blood pressure was increasing.  Her LDL was noted to be high and the  patient would benefit from a statin.  Unfortunately, she is unable to  afford it at this time; and Dr. Rito Ehrlich has made arrangements for  follow up with Dr. Liliane Channel hopefully a statin can be started if there are  samples or with pharmaceutical company assistance.  Also she was started  on Protonix here.  She takes Pepcid, as needed as an outpatient, but it  also may be worthwhile to start her on a proton pump inhibitor, again,  the cost is prohibitive; and hopefully that can be set up as an  outpatient.  Dr. Samule Ohm recommends checking a BMET  next week; and I will  arrange this.   The patient's blood glucoses have remained about 120-180.  There is some  question as to whether she is on Amaryl alone, or whether she also takes  Avandia as dictated in the H&P.  This can be monitored as an outpatient  as her hemoglobin A1c was elevated; and she will likely need adjustment  of her medications.   Her blood pressure was elevated during her hospitalization.  She was on,  initially beta blocker, and subsequently ACE inhibitor.  I have elected  to double her Avalide/hydrochlorothiazide and reassess as an outpatient;  as she has no insurance and cannot afford her medications.      Corinna L. Lendell Caprice, MD  Electronically Signed     CLS/MEDQ  D:  09/02/2006  T:  09/03/2006  Job:  [id number]   cc:   Yetta Barre, M.D.

## 2011-02-19 NOTE — H&P (Signed)
NAMEBONETA, STANDRE               ACCOUNT NO.:  [id number]   MEDICAL RECORD NO.:  [id number]          PATIENT TYPE:  EMS   LOCATION:  MAJO                         FACILITY:  [facility]   PHYSICIAN:  Lucita Ferrara, MD         DATE OF BIRTH:  November 08, 1950   DATE OF ADMISSION:  08/30/2006  DATE OF DISCHARGE:                                HISTORY & PHYSICAL   The patient is a 60 year old female who arrived to [facility] Emergency Room  with chest pain.  Pain is located in the retrosternal area.  It radiates to  the back and left arm.  The patient says that it woke her up yesterday.  It  has been constant in nature.  It is described as pressure like, located in  the left chest, left lateral precordial area, and left axilla and radiating  down the left arm.  She denies any nausea or fever or cough.  It is not  related to p.o. intake.  She does have a history of gastroesophageal reflux  and heartburn, but it is not the same type of pain.  Chest pain is  accompanied by shortness of breath.  Pain is worse upon deep inspiration.  She says that she sleeps on one big pillow which is equivalent to 2 small  pillows.  She has no paroxysmal nocturnal dyspnea.  She does not have any  relieving factors.  The pain has been there for the last 2 days on and off.  She has never had this pain before.  Risk factors include: diabetic and  hypertensive, both uncontrolled, and she also is obese.  As far as her  diabetes goes, she does not routinely get followup medical care regarding  this issue.  She does not remember her last hemoglobin A1c.  She is on  Amaryl and, interestingly so, on Avandia.  She says she gets her medications  from the health department at [facility].  She says she gets eye exams;  the last one was one year ago.   PAST MEDICAL HISTORY:  Significant or diabetes and hypertension as above.  They are both uncontrolled.  The patient states that she had what she calls  a stroke in 2004 located in  the facial area where she had a facial droop  which has now resolved.  She states that in the emergency room, somebody  told her that she had Bell's palsy and an acute stroke.  She says that this  problem has resolved.   PAST SURGICAL HISTORY:  She has had a lumpectomy one year ago.   SOCIAL HISTORY:  She is a nonsmoker.  She does not drink and no drug abuse.   ALLERGIES:  No known drug allergies.   MEDICATIONS:  1. Amaryl 4 mg 1 tablet p.o. daily.  2. Avandia.  She does not know the dosage, but I believe she was saying      that it might be 4 mg 1 tablet p.o. daily.   PHYSICAL EXAMINATION:  GENERAL:  The patient is in no acute distress.  VITAL SIGNS: Blood pressure  184/97 when she came in.  On reevaluation, it  was 126/68.  Pulse oximetry is 96% on room air.  Pulse 81.  HEENT: Normocephalic and atraumatic.  Sclerae anicteric.  Mucous membranes  moist.  Pupils equal, round, and reactive to light and accommodation.  Extraocular muscles intact.  NECK: Supple.  No JVD,  no carotid bruits, no thyromegaly.  CARDIOVASCULAR: S1, S2.  Regular rate and rhythm.  No murmurs, rubs, clicks.  LUNGS:  Clear to auscultation bilaterally.  No rhonchi, no wheezes, no  rales.  ABDOMEN: Soft, nontender, nondistended.  Positive bowel sounds.  EXTREMITIES:  No cyanosis, no clubbing, no edema. Pulses are 2+ bilaterally  upper and lower extremities.  NEUROLOGIC: The patient is alert and oriented x3.  Cranial nerves II-XII  grossly intact. Strength 5/5 upper and lower extremities.  Sensation intact  upper and lower extremities.   LABORATORY DATA:  Sodium 141, potassium 4.1, chloride 107, BUN 16, glucose  75.  ABGs essentially normal.  First set of cardiac enzymes are negative.  D-  dimer less than 0.22.  Lipase 22.  Urinalysis essentially negative.   Chest x-ray showed mild cardiomegaly with vascular congestion and  interstitial prominence and questionable early interstitial edema.   EKG showed a  normal sinus rhythm and nonspecific ST-T wave changes.  No  acute ST elevation, no Q waves but poor R wave progression, first degree AV  block.   The patient was given some clonidine to decrease her blood pressure here.  She also was given some nitroglycerin and aspirin for pain control.   ASSESSMENT AND PLAN:  1. This is a 60 year old female with chest pain.  Given her multiple      cardiac risk factors, I am going to go ahead and admit her to the      telemetry unit.  I am going to go ahead and continue aspirin and get      standard laboratory data, fasting lipid profile.  Will continue the      cardiac enzymes x3 q 8 h.  I am going to put her on a monitor for      arrhythmias.  Tomorrow morning, I may get a cardiac stress test.  I am      going to go ahead and consult with cardiology tonight to see what they      expect to be done.  2. As far as diabetes goes, I am going to go ahead and stop the Avandia at      this time given her cardiomegaly and cardiac risk factors.  I am going      to also hold off on Amaryl and put her on sliding scale insulin.  I am      going to go ahead and start a diabetic control carbohydrate calorie      diet.  She will need diabetic teaching and weight loss teaching as      well.  3. As far as her hypertension goes,  I am going to go ahead and start her      on a beta blocker, decrease cardiac risk factors. She currently is not      on any antihypertensive medications.  I believe that she really needs      this, so I will go ahead and start her on Toprol XL 25 mg 1 tablet p.o.      daily.   I have explained all of the care plans to the patient, and the patient  understands well.      Lucita Ferrara, MD  Electronically Signed     RR/MEDQ  D:  08/30/2006  T:  08/30/2006  Job:  [id number]

## 2011-02-19 NOTE — Op Note (Signed)
NAMESHIZUKO, WOJDYLA               ACCOUNT NO.:  [id number]   MEDICAL RECORD NO.:  [id number]          PATIENT TYPE:  AMB   LOCATION:  DSC                          FACILITY:  [facility]   PHYSICIAN:  Lorre Munroe., M.D.DATE OF BIRTH:  13-Dec-1950   DATE OF PROCEDURE:  03/19/2005  DATE OF DISCHARGE:                                 OPERATIVE REPORT   PREOPERATIVE DIAGNOSIS:  Carcinoma of left breast.   POSTOPERATIVE DIAGNOSIS:  Carcinoma of left breast.   OPERATION:  Left partial mastectomy.   SURGEON:  Lebron Conners, M.D.   ANESTHESIA:  General.   CLINICAL NOTE:  The patient is a 60 year old black female who was found have  a new mass in the left breast on screening mammogram.  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  The patient  saw me and was advised to have a partial mastectomy and a sentinel lymph  node biopsy.  She agreed to have the partial mastectomy, but declined to  have the sentinel lymph node biopsy.   PROCEDURE:  After the patient was monitored and had good general anesthesia  and routine preparation and draping of the left breast, I made a  circumareolar incision at the inferior aspect of the left areola.  I  dissected the localizing wire into the incision by subcutaneous dissection.  I then excised a generous cylinder of fat and breast tissue, allowing, as  best I could, 2-3 cm of tissue in all directions from the localizing wire  and a generous margin deep to the wire.  I packed the cavity with gauze and  palpated the specimen, and oriented it with sutures for histologic  examination.  I could feel the tumor and it felt as if it was well-within  the biopsy specimen and it did not  appear that I had to come close to it at any spot.  I sent it for  confirmation that the radiographic abnormality had been removed and this was  received.  I got good hemostasis with cautery.  I closed the skin with  intracuticular 4-0 Vicryl  and Steri-Strips.  Sponge, needle and instrument  counts were correct.       WB/MEDQ  D:  03/19/2005  T:  03/20/2005  Job:  [id number]   cc:   [facility]   Sigmund Hazel, M.D.  [address]  [address] [place], [place] [address]  Fax: [phone]

## 2011-02-19 NOTE — Op Note (Signed)
NAMEFRANCESCA, [name]                           ACCOUNT NO.:  [id number]   MEDICAL RECORD NO.:  [id number]                   PATIENT TYPE:  AMB   LOCATION:  ENDO                                 FACILITY:  [facility]   PHYSICIAN:  Anselmo Rod, M.D.               DATE OF BIRTH:  08-Jan-1951   DATE OF PROCEDURE:  09/10/2003  DATE OF DISCHARGE:                                 OPERATIVE REPORT   PROCEDURE:  Screening colonoscopy.   ENDOSCOPIST:  Anselmo Rod, M.D.   INSTRUMENT USED:  Olympus video colonoscope.   INDICATIONS FOR PROCEDURE:  A 60 year old African-American female with a  history of guaiac positive stools and a personal history of polyps,  undergoing screening colonoscopy to rule out colonic polyps, masses, etc.   PREPROCEDURE PREPARATION:  Informed consent was procured from the patient.  The patient was fasted for eight hours prior to the procedure and prepped  with a bottle of magnesium citrate and a gallon of GoLYTELY the night prior  to the procedure.   PREPROCEDURE PHYSICAL EXAMINATION:  VITAL SIGNS:  Stable.  NECK:  Supple.  CHEST:  Clear to auscultation.  S1 and S2 regular.  ABDOMEN:  Soft with normal bowel sounds.   DESCRIPTION OF PROCEDURE:  The patient was placed in the left lateral  decubitus position and sedated with 80 mg of Demerol and 8 mg of Versed in  slow incremental doses.  Once the patient was adequately sedated and  maintained on low flow oxygen and continuous cardiac monitoring, the Olympus  video colonoscope was advanced from the rectum to the cecum.  The  appendiceal orifice and the ileocecal valve were clearly visualized and  photographed.  Small internal hemorrhoids were seen on retroflexion and  isolated diverticulum was seen in the mid transverse colon.  No masses or  polyps were identified.  The patient tolerated the procedure well without  complications.   IMPRESSION:  Essentially normal colonoscopy up to the cecum except for an  isolated diverticulum in the mid transverse colon.  Small nonbleeding  internal hemorrhoids seen on retroflexion.   RECOMMENDATIONS:  Repeat CRC screening is recommended in the next five years  unless the patient develops any abnormal symptoms in the interim.  Continue  a high fiber diet and liberal fluid intake.  Upper GI with small bowel  follow through to rule out a source of GI blood loss consistent with the  patient's questionable history of melena.  Outpatient follow-up within the  next four weeks or earlier if needed.                                               Anselmo Rod, M.D.    JNM/MEDQ  D:  09/10/2003  T:  09/10/2003  Job:  [id number]  cc:   Sigmund Hazel, M.D.  [address]  [address] [place], [place] [address]  Fax: [phone]

## 2011-02-19 NOTE — Consult Note (Signed)
[name], [name]               ACCOUNT NO.:  [id number]   MEDICAL RECORD NO.:  [id number]          PATIENT TYPE:  INP   LOCATION:  3711                         FACILITY:  [facility]   PHYSICIAN:  Holly Rotunda, MD, FACCDATE OF BIRTH:  [birth year]   DATE OF CONSULTATION:  DATE OF DISCHARGE:                                 CONSULTATION   PRIMARY CARDIOLOGY:  The patient is neutral about our cardiology, being  seen by Dr. Antoine [name].   PROFILE:  A 60 year old [place] American female with prior history of  hypertension and diabetes who presents with:  1. Chest pain.      a.     On August 31, 2006, adenosine Myoview, EF 57% with       anterior apical and apical lateral ischemia as well as hypokinesis       in those areas.  2. Type 2 diabetes mellitus, diagnosed in 2004.  3. Hypertension, diagnosed in 1994.  4. History of left breast cancer, diagnosed in May of 2006.      a.     June 2006, status post lumpectomy.      b.     September of 2006, status post lymph node dissection.      c.     Status post radiation therapy finishing in December of 2006.  5. History of Bell palsy.  6. History of hyperlipidemia.  7. Obesity.   HISTORY OF PRESENT ILLNESS:  This 59 year old African American female  without prior history of CAD who does have a history of hypertension and  diabetes.  She was in the usual state of health, in the afternoon,  November 26, when she began to experience 8 to 10 out of 10 throbbing,  sharp (like a lightning bolt) chest pain with radiation to the jaw and  left arm associated with nausea, diaphoresis, and shortness of breath.  Symptoms were worse with deep breathing and with exertion.  Symptoms  persisted all through the night on Monday, and she was fairly restless,  not sleeping much, as well as Tuesday.  At work on Tuesday, she did also  note some worsening dyspnea with exertion.  After work on Tuesday, the  27th, she went to the health department to see a Dr.  Lovell [name] and was  quickly referred to [facility] ED.  In the ED, ECG was without any acute  changes and cardiac markers negative.  She was admitted and chest pain  persisted for the remainder of the day, Tuesday and all day yesterday.  She underwent an adenosine Myoview yesterday, which showed normal EF  with anterior apical and apical lateral ischemia with associated  hypokinesis in those areas.  We have now been asked to consult.  She is  currently pain free.   ALLERGIES:  She has seasonal allergies.  She is also allergic to  Benadryl causing tachy palpitations.  She previously took Crestor, and  shortly after being started on this, she developed Bell's palsy, and  thus links statins to the Bell's palsy which she had developed.   CURRENT MEDICATIONS:  1. Aspirin 325 mg daily.  2. Lopressor 25 mg b.i.d.  3. Protonix 40 mg daily.  4. Lantus 5 units q.h.s. and sliding scale insulin.  5. At home, the patient takes Amaryl, Avandia, Avalide, green tea,      potassium, magnesium, zinc, selenium, and Bilberry.   FAMILY HISTORY:  Mother died of lung cancer age 69, father died of  complications of diabetes at age 60.  Also, she has a brother who died  of a pulmonary embolism and a sister who died of breast cancer.   SOCIAL HISTORY:  She lives in [place] with her daughter.  She works  for a daycare site.  She denies any tobacco, alcohol, or drug use and  tries to remain active at home and walks in the mall, 3 to 4 days a week  with her daughter.   REVIEW OF SYSTEMS:  Positive for chest pain, shortness of breath,  dyspnea, exertion, diaphoresis, diabetes, and nausea.  All other systems  reviewed are negative.   PHYSICAL EXAMINATION:  VITAL SIGNS:  Temperature 97.2, heart rate 68,  respirations 18, blood pressure 158/79.  GENERAL:  Pleasant African American female in no acute distress, awake,  alert, and oriented x3.  HEENT:  Atraumatic, normocephalic.  NECK:  Obese without bruits or  JVD.  NEURO:  Grossly intact and nonfocal.  SKIN:  Warm and dry without lesions or masses.  LUNGS:  Respirations regular, unlabored.  CARDIAC:  Regular S1, S2, no S3 or S4 murmurs.  ABDOMEN:  Round, soft, nontender, nondistended.  Bowel sounds present  x4.  EXTREMITIES:  Warm and dry, no clubbing, cyanosis, or edema.  Dorsal  pedis and tibial pulses 2+ bilaterally.  No femoral bruits are noted.   Chest x-ray shows mild cardiomegaly with vascular congestion with  interstitial prominence and early interstitial edema.  EKG shows sinus  rhythm at a rate of 61, no acute ST-T changes.   Hemoglobin 12.6, hematocrit 37.8, WBC 6.4, platelets 243.  Sodium 138,  potassium 3.1, chloride 104, CO2 of 25, BUN 16, creatinine 0.7, glucose  60.  CK 230, MB 2.1, troponin I 0.01, PTT 32, PT 13.4, INR 1.0,  hemoglobin A1c 8.3, D-dimer less than 0.22.  Total cholesterol 213,  triglycerides 91, HDL 52, LDL 148.   ASSESSMENT AND PLAN:  1. Chest pain.  The patient presents with fairly atypical features      (greater than 40-hour duration with negative cardiac enzymes and      nonacute ECG, as well as symptoms that worsen with deep breathing).      However, she does have multiple risk factors with an abnormal      functional study.  Question incidental finding of ischemia in the      setting of an unrelated atypical chest pain.  Given her risk      factors and abnormal Myoview, we will plan for cardiac      catheterization tomorrow.  The patient wishes to proceed.  2. Hypertension.  Blood pressure is elevated.  Continue beta blocker,      add ACE inhibitor.  3. Type 2 diabetes mellitus, per primary team.  She is on oral      medications at home with a hemoglobin A1c of 8.3.  Plan to add an      ACE inhibitor for renal protection.  4. Hyperlipidemia.  LDL 148.  The patient is unwilling to take a      statin, secondary to history of Bell palsy that     occurred after  Crestor was prescribed in the past.  5.  Obesity.  She will likely benefit from a nutritional consult and      diabetes education.  Question cardiac rehab if appropriate.      Nicolasa Ducking, ANP    ______________________________  Holly Rotunda, MD, [facility]    CB/MEDQ  D:  09/01/2006  T:  09/02/2006  Job:  [id number]

## 2011-02-19 NOTE — Assessment & Plan Note (Signed)
[facility]                            CARDIOLOGY OFFICE NOTE   EVIAN, DERRINGER                      MRN:          [id number]  DATE:09/15/2006                            DOB:          1951-09-28    CARDIOLOGIST:  Dr. Rollene Rotunda.   PRIMARY CARE PHYSICIAN:  She will be followed at [facility]   HISTORY OF PRESENT ILLNESS:  [name] is a 60 year old female patient  who was seen in consultation for chest pain when she was admitted to  [facility] August 30, 2006.  She underwent stress testing to  rule out for myocardial infarction and this showed apical ischemia and  she was set up for cardiac catheterization.  This showed moderate non-  obstructive disease as outlined below.  It was felt that she had a false  positive Cardiolite study and medical therapy was recommended.  The  patient follows up today for post hospitalization.  She did call the  office a few days ago with some complaints of swelling and discharge  from her cath site.  She was set up for an out patient ultrasound and  this showed no evidence of pseudoaneurysm or AV fistula.  She has been  monitoring her blood pressures and they continue to remain elevated.  Her Avalide was increased in the hospital.  She occasionally has some  atypical left sided chest pains that are sharp and last only seconds.  There are no exertional symptoms.  She does have some dyspnea on  exertion when she goes up steps, but this has been chronic without  changes.  Denies any orthopnea, denies any syncope.  She does  occasionally note her heartbeat in her stomach.   CURRENT MEDICATIONS:  1. Avalide 300/25 mg daily.  2. Amaryl 4 mg b.i.d.  3. Zyrtec 10 mg daily.  4. Multivitamin.  5. Acid controller.  6. Aspirin 81 mg daily.   ALLERGIES:  CRESTOR and BENADRYL.   PHYSICAL EXAMINATION:  She is a well-nourished, well-developed white  female in no distress.  Blood  pressure is 160/98.  Pulse 76.  Weight 217  pounds.  HEENT:  Unremarkable.  NECK:  Without JVD.  CARDIAC:  S1, S2.  Regular rate and rhythm.  LUNGS:  Are clear to auscultation bilaterally.  ABDOMEN:  Is soft and obese.  No pulsatile masses can be palpated but  there is some pain with palpation.  EXTREMITIES:  With trace edema.  Calves are soft, nontender.  Right  groin without hematomas or bruise.   Electrocardiogram reveals sinus rhythm with 1st degree AV block.  Ventricular rate of 79.  No acute changes.   IMPRESSION:  1. Moderate nonobstructive coronary disease with false positive      Myoview study.      a.     Left main 30% ostial stenosis.  Left anterior descending no       irregularities.  Ramus intermedius with a small branch with 100%       stenosis.  AV groove circumflex 50% and a marginal with 50%  proximally and a right coronary artery with minor luminal       irregularities.  2. Good left ventricular function.  3. Diabetes mellitus.  4. Hypertension.  5. Hyperlipidemia.  6. History of Bells Palsy.  7. Obesity.  8. Atypical chest pain.  9. Abdominal pain with palpation.   PLAN:  The patient presents today for post hospitalization followup.  Her blood pressure is still elevated.  I did consider starting her on  generic Coreg.  However, she does have a 1st degree AV block and I am a  little bit hesitant to do that.  We will start her on generic Norvasc 5  mg daily.  I talked to her at length about risk factor modification  including medications for lipids. She says that she developed Bells  Palsy in the past in relation to this.  I looked this up and I do not  see this indicated.  She is agreeable to try a statin again and we will  try her back on simvastatin 40 mg a day, which she can get at [facility]  for 4 dollars.  She is due to see one of the residents at [facility].  I have stressed the importance of following up on  her diabetes  mellitus.  We will check a B-MET today as her Avalide was  increased in the hospital.  We will also get her to come back in 6 to 8  weeks for lipids and liver function tests.  I will have her follow up  with Dr.  Antoine Poche or myself in 4 weeks.  We will get an abdominal  ultrasound to evaluate her abdominal pain and pulsatile sensations in  her abdomen.      Tereso Newcomer, PA-C  Electronically Signed      Rollene Rotunda, MD, [facility]  Electronically Signed   SW/MedQ  DD: 09/15/2006  DT: 09/15/2006  Job #: [id number]

## 2011-02-19 NOTE — Assessment & Plan Note (Signed)
[facility]                            CARDIOLOGY OFFICE NOTE   [name], [name]                      MRN:          [id number]  DATE:11/01/2006                            DOB:          [birth year]    REASON FOR PRESENTATION:  Evaluate patient with nonobstructive coronary  disease.   HISTORY OF PRESENT ILLNESS:  The patient returns for follow up. She had  stress perfusion study suggesting myocardial infarction and apical  ischemia. However, follow up catheterization is described below with  nonobstructive disease. She was seen back here in December by our PA and  had some groin pain. A groin ultrasound demonstrated no evidence of  pseudoaneurysm or AV fistula. She said she has been doing well since we  last saw her. She has not been having any chest discomfort, neck  discomfort or arm discomfort. She is having no new palpitations and  denies any presyncope or syncope. She is a little bit confused on her  medicines but I think we have sorted this out as described below.   PAST MEDICAL HISTORY:  1. Coronary artery disease (LAD luminal irregularities, ramus      intermediate with superior branch 70% stenosis, circumflex 50%      stenosis, marginal 50% stenosis, well preserved ejection fraction).  2. Diabetes.  3. Hypertension.  4. Hyperlipidemia.  5. Bell's palsy.  6. Obesity.  7. Breast cancer status post lumpectomy, lymph node resection and      radiation therapy.   ALLERGIES:  CRESTOR AND BENADRYL.   MEDICATIONS:  Zyrtec, multivitamin, acid controller, aspirin, Metformin  500 mg b.i.d., Lipitor (patient does not know the dose),  hydrochlorothiazide 25 mg, amlodipine 5 mg daily.   REVIEW OF SYSTEMS:  As stated in the HPI and otherwise negative for  other systems.   PHYSICAL EXAMINATION:  GENERAL: The patient is in no distress.  VITAL SIGNS: Blood pressure 147/83, heart rate 92 and regular, weight  214 pounds.  NECK: No jugular venous  distension at 45 degrees, carotid upstrokes  brisk and symmetric, no bruits, no thyromegaly.  LYMPHATICS: No adenopathy.  LUNGS: Clear to auscultation bilaterally.  BACK: No costovertebral angle tenderness.  CHEST: Mild tenderness to palpation of the left breast at the previous  surgery site.  HEART: The PMI is not displaced or sustained, S1 and S2 within normal  limits, no S3, no S4, no murmurs.  ABDOMEN: Obese, positive bowel sounds, normal in frequency and pitch, no  bruits, rebound, guarding, midline pulsatile mass, hepatomegaly,  hepatosplenomegaly.  SKIN: No rashes, no nodules.  EXTREMITIES: Two + pulses, no edema.   ASSESSMENT AND PLAN:  1. Coronary disease. The patient has nonobstructive coronary disease      and does not have any ongoing symptoms. No further cardiovascular      testing is suggested.  2. Hypertension. Her blood pressure is still slightly elevated. I      think 5 to 10 pounds of weight loss would be the next therapeutic      step to try to bring this down to a target of 120/70. I have  prescribed for her the [facility] Diet.  3. Dyslipidemia. I will let this be followed by her primary care      physicians. They have recently changed her medications.   FOLLOW UP:  I will see her back in 1 year or sooner if she has any  problems.     Rollene Rotunda, MD, [facility]  Electronically Signed    JH/MedQ  DD: 11/01/2006  DT: 11/01/2006  Job #: [id number]   cc:   Adonis Housekeeper, M.D.

## 2011-02-19 NOTE — Cardiovascular Report (Signed)
NAMEJANEICE, STEGALL               ACCOUNT NO.:  [id number]   MEDICAL RECORD NO.:  [id number]          PATIENT TYPE:  INP   LOCATION:  3711                         FACILITY:  [facility]   PHYSICIAN:  Salvadore Farber, MD  DATE OF BIRTH:  [birth year]   DATE OF PROCEDURE:  08/30/2006  DATE OF DISCHARGE:  09/02/2006                            CARDIAC CATHETERIZATION   PROCEDURE:  Left heart catheterization, left ventriculography, coronary  angiography, AngioSeal closure of the right common femoral arteriotomy  site.   INDICATIONS:  Ms. Vilar is a 60 year old lady with uncontrolled  hypertension and diabetes mellitus who presents with chest discomfort.  It had some typical features but also persisted for two days with  negative enzymes and electrocardiogram, despite near constant pain.  She  underwent adenosine Cardiolite which suggested reversible anteroapical  defect.  She was then referred for cardiac catheterization.   PROCEDURE TECHNIQUE:  Informed consent was obtained.  Under 1% lidocaine  local anesthesia, a 5-French sheath was placed in the right common  femoral artery using the modified Seldinger technique.  Diagnostic  angiography and ventriculography were performed using JL-4, JR-4, and  pigtail catheters.  The arteriotomy was then closed using a Starclose  device.  Complete hemostasis was obtained.  The patient was then  transferred to the holding room in stable condition having tolerated the  procedure well.   COMPLICATIONS:  None.   FINDINGS:  1. LV:  197/8/19.  EF 60% without regional wall motion abnormality.  2. No aortic stenosis or mitral regurgitation.  3. Left main:  Calcified plaque proximally with approximately 30%      stenosis at the ostium.  4. LAD:  Moderate sized vessel with only minor luminal irregularities.  5. Ramus intermedius:  Moderate sized vessel.  One small branch of      this has approximately 70% stenosis.  6. Circumflex:  Very large dominant  vessel giving rise to a very large      branching obtuse marginal and a small PDA.  The AV groove      circumflex has a 50% stenosis just after the origin of the large      marginal.  The marginal has a 50% stenosis proximally.  7. RCA:  Small, nondominant vessel.  There are only minor luminal      irregularities.   IMPRESSION/PLAN:  Ms. Crilly has moderate nonobstructive atherosclerotic  coronary disease with preserved left ventricular size and systolic  function.  Her adenosine Cardiolite is falsely positive, likely due to  her habitus.  Her chest pain may be due to her hypertension or another  cause.  With her diabetes and hypertension, will add an ACE inhibitor.      Salvadore Farber, MD  Electronically Signed     WED/MEDQ  D:  09/02/2006  T:  09/02/2006  Job:  [id number]   cc:   Rollene Rotunda, MD, [facility]  Dr. Modena Jansky, MD

## 2011-03-09 ENCOUNTER — Ambulatory Visit (HOSPITAL_COMMUNITY)
Admission: RE | Admit: 2011-03-09 | Discharge: 2011-03-09 | Disposition: A | Discharge status: Home / Self Care | Payer: Medicaid Other | Source: Ambulatory Visit | Attending: Internal Medicine | Admitting: Internal Medicine

## 2011-03-09 ENCOUNTER — Telehealth: Payer: Self-pay | Admitting: *Deleted

## 2011-03-09 ENCOUNTER — Encounter: Payer: Self-pay | Admitting: Internal Medicine

## 2011-03-09 ENCOUNTER — Ambulatory Visit (INDEPENDENT_AMBULATORY_CARE_PROVIDER_SITE_OTHER): Payer: Self-pay | Admitting: Internal Medicine

## 2011-03-09 DIAGNOSIS — E119 Type 2 diabetes mellitus without complications: Secondary | ICD-10-CM

## 2011-03-09 DIAGNOSIS — G4733 Obstructive sleep apnea (adult) (pediatric): Secondary | ICD-10-CM

## 2011-03-09 DIAGNOSIS — I10 Essential (primary) hypertension: Secondary | ICD-10-CM

## 2011-03-09 DIAGNOSIS — R079 Chest pain, unspecified: Secondary | ICD-10-CM | POA: Insufficient documentation

## 2011-03-09 DIAGNOSIS — D509 Iron deficiency anemia, unspecified: Secondary | ICD-10-CM

## 2011-03-09 DIAGNOSIS — R072 Precordial pain: Secondary | ICD-10-CM

## 2011-03-09 DIAGNOSIS — Z0000 Encounter for general adult medical examination without abnormal findings: Secondary | ICD-10-CM

## 2011-03-09 DIAGNOSIS — G8929 Other chronic pain: Secondary | ICD-10-CM

## 2011-03-09 DIAGNOSIS — E785 Hyperlipidemia, unspecified: Secondary | ICD-10-CM

## 2011-03-09 LAB — COMPLETE METABOLIC PANEL WITH GFR
ALT: 18 U/L (ref 0–35)
Alkaline Phosphatase: 73 U/L (ref 39–117)
BUN: 17 mg/dL (ref 6–23)
CO2: 28 mEq/L (ref 19–32)
Calcium: 9.3 mg/dL (ref 8.4–10.5)
Creat: 0.76 mg/dL (ref 0.50–1.10)
GFR, Est African American: >60 mL/min (ref >60)
GFR, Est Non African American: >60 mL/min (ref >60)
Sodium: 139 mEq/L (ref 135–145)
Total Bilirubin: 0.2 mg/dL — ABNORMAL LOW (ref 0.3–1.2)

## 2011-03-09 LAB — CBC WITH DIFFERENTIAL/PLATELET
Basophils Absolute: 0 10*3/uL (ref 0.0–0.1)
Eosinophils Absolute: 0.2 10*3/uL (ref 0.0–0.7)
Hemoglobin: 10.9 g/dL — ABNORMAL LOW (ref 12.0–15.0)
Lymphocytes Relative: 41 % (ref 12–46)
Lymphs Abs: 2.6 10*3/uL (ref 0.7–4.0)
MCV: 83.5 fL (ref 78.0–100.0)
Monocytes Relative: 7 % (ref 3–12)
Neutrophils Relative %: 49 % (ref 43–77)
Platelets: 255 10*3/uL (ref 150–400)
RBC: 4.05 MIL/uL (ref 3.87–5.11)
WBC: 6.3 10*3/uL (ref 4.0–10.5)

## 2011-03-09 LAB — CK TOTAL AND CKMB (NOT AT ARMC)
CK, MB: 2.3 ng/mL (ref 0.3–4.0)
Relative Index: 1 (ref 0.0–2.5)
Total CK: 221 U/L — ABNORMAL HIGH (ref 7–177)

## 2011-03-09 LAB — PRO B NATRIURETIC PEPTIDE: Pro B Natriuretic peptide (BNP): 68.8 pg/mL (ref 0–125)

## 2011-03-09 LAB — GLUCOSE, CAPILLARY: Glucose-Capillary: 163 mg/dL — ABNORMAL HIGH (ref 70–99)

## 2011-03-09 MED ORDER — NITROGLYCERIN 0.4 MG SL SUBL: 0.4000 mg | SUBLINGUAL_TABLET | SUBLINGUAL | Status: DC | PRN

## 2011-03-09 MED ORDER — ROSUVASTATIN CALCIUM 20 MG PO TABS: 20.0000 mg | ORAL_TABLET | Freq: Every day | ORAL | Status: DC

## 2011-03-09 MED ORDER — FUROSEMIDE 40 MG PO TABS: 40.0000 mg | ORAL_TABLET | Freq: Every day | ORAL | Status: DC

## 2011-03-09 MED ORDER — NITROGLYCERIN 0.6 MG SL SUBL: 0.6000 mg | SUBLINGUAL_TABLET | SUBLINGUAL | Status: DC | PRN

## 2011-03-09 NOTE — Assessment & Plan Note (Signed)
There was not time to sufficiently address her pain today so I will get her in next week or the week after to address this.

## 2011-03-09 NOTE — Assessment & Plan Note (Signed)
Her echocardiogram in 2010 did not show any right-sided heart damage but I will repeat that today since her symptoms have increased to see her obstructive sleep apnea has caused any right-sided heart failure.

## 2011-03-09 NOTE — Assessment & Plan Note (Signed)
She was switched from Lantus to Novolin 70/30 at her last visit because of financial difficulties. Today she tells me that she might be able to get the Lantus. We did not have time to address this today so I am going to get her back in one to 2 weeks to discuss this. She is up-to-date on her A1c, fasting lipid panel, and a foot exam. She does not have insurance for an ophthalmology exam so we are doing a retinal photograph today. I do not have time to discuss vaccinations with her although echart stated she refused Pneumovax at her hospitalization in 2012.

## 2011-03-09 NOTE — Assessment & Plan Note (Signed)
BP Readings from Last 3 Encounters:  03/09/11 121/75  02/02/11 165/119  12/18/10 126/72   Her blood pressure is excellent today. Because of this significant lower extremity edema I am going to stop her hydrochlorothiazide and try 40 mg of Lasix daily. I told her she could take her first dose today. I also instructed her that she may not want to take it tomorrow morning as she is going to be in cardiology and may not have easy access to a toilet frequently. She will come back and see me in one to 2 weeks and I can check a metabolic panel at that time and check her blood pressures doing. She is to continue to take her potassium daily.

## 2011-03-09 NOTE — Patient Instructions (Signed)
Stop the HCTZ Start Lasix today. Try the NTG under your tongue if you get chest pain. If no better after 5 min, try a second. If not better after 5 min, try a third. If no better. Call 911. See me in 1-2 weeks. See the heart doctor.

## 2011-03-09 NOTE — Telephone Encounter (Signed)
Pleas make the change. I changed the med list. Thanks

## 2011-03-09 NOTE — Assessment & Plan Note (Signed)
Her most recent hemoglobin was 12.2 in March 2012. That is low but not sufficiently low to cause her dyspnea. I will repeat her CBC today and ferritin to see if he is making any improvement in her iron deficiency anemia.

## 2011-03-09 NOTE — Progress Notes (Signed)
  Subjective:    Patient ID: [name], female    DOB: 1951/06/16, 60 y.o.   MRN: [id number]  HPI  Please see the A&P for the status of the pt's chronic medical problems.   Review of Systems  Constitutional: Positive for activity change and fatigue.  HENT: Positive for sore throat and voice change. Negative for drooling.   Eyes: Positive for visual disturbance. Negative for photophobia.  Respiratory: Positive for apnea and shortness of breath. Negative for cough and chest tightness.   Cardiovascular: Positive for chest pain and leg swelling.  Gastrointestinal: Positive for abdominal distention.  Genitourinary: Positive for decreased urine volume and difficulty urinating. Negative for dysuria.  Musculoskeletal: Positive for back pain and gait problem.  Neurological: Positive for syncope and weakness.  Psychiatric/Behavioral: Positive for sleep disturbance. The patient is not hyperactive.        Objective:   Physical Exam  Constitutional: She is oriented to person, place, and time. She appears well-developed and well-nourished. She is cooperative.       Morbidly obese. Has cane in the right hand.  HENT:  Head: Normocephalic and atraumatic.  Right Ear: External ear normal.  Left Ear: External ear normal.  Nose: Nose normal.  Eyes: Conjunctivae are normal.  Neck: No tracheal deviation present. No thyromegaly present.  Cardiovascular: Normal rate, regular rhythm and normal heart sounds.  Exam reveals no friction rub.   No murmur heard. Pulmonary/Chest: Effort normal and breath sounds normal. No stridor. No respiratory distress. She has no wheezes.       Able to speak in full sentences without respiratory distress  Abdominal: Soft. She exhibits no abdominal bruit and no pulsatile midline mass. Bowel sounds are decreased. There is no tenderness.       Obese abdomen but with no pitting edema of the abdominal pannus  Musculoskeletal: She exhibits edema and tenderness.       Edema  goes up to knees but I could not appreciate pitting edema of the thighs. +2 edema from toes to knee  Neurological: She is alert and oriented to person, place, and time.  Skin: Skin is warm and dry. No rash noted. No erythema. No pallor.  Psychiatric: She has a normal mood and affect. Her behavior is normal. Thought content normal.          Assessment & Plan:

## 2011-03-09 NOTE — Telephone Encounter (Signed)
[facility] states they do not carry 0.6mg  sl tablets, they only carry 0.4mg , may we change? If so please change med list and i will call in.  Thanks, h.

## 2011-03-09 NOTE — Assessment & Plan Note (Addendum)
She is on Lipitor and her last fasting lipid panel 4 months ago showed an LDL of 135. She needs aggressive risk factor management. I am going to stop her atorvastatin and start her on Crestor 20 mg a day. She is to call the health Department prior to picking this up as they may not carry Crestor. If they do not she is to continue her atorvastatin.

## 2011-03-09 NOTE — Telephone Encounter (Signed)
Called to pharm 

## 2011-03-09 NOTE — Assessment & Plan Note (Addendum)
Ms. Foley complains of about one year of increasing angina. She gets the pain whenever she lifts up a grocery bag containing a jug of milk and a laundry detergent, whenever she drinks too much water and the fluid accunulates in her chest, whenever she pushes herself to walk further, or when she takes a bath. She describes the pain as involving her entire left arm and her anterior left sided chest. When the pains occur she sits down and takes an aspirin crushes it up and puts her under her tongue and the pain relieves. Additionally she has had about one year of increasing dyspnea at rest and dyspnea with exertion. She gets dyspneic anytime she tries to walk even around her house or when she tries to take a bath.  She complained of this at her last visit and was set up to see cardiology and has an appointment tomorrow at 11 AM with Dr. Antoine Poche.  She has a known coronary artery disease. She is on a beta blocker, and aspirin, and ARB, and a statin. Her last echocardiogram was in 2010 and her EF was slightly reduced at 45-50% with mild LVH and her right side was normal. Her catheterization was in 2007 and medical management was recommended.  If she only had pain when lifting a heavy grocery sack I might have been less inclined to think that this is cardiac in origin. However since she gets chest pain and dyspnea while simply taking a bath I am more inclined to think that this is cardiac especially since she has known coronary artery disease. The next question is whether she needs admission or not. Her oxygen saturation is 100% and her lung fields are completely clear. Her EKG is unchanged. She does not want to be admitted and I feel that she does not need to be admitted. She will keep her appointment tomorrow with cardiology at 11 AM. I am going to get a CMP, pro BNP, cardiac enzymes, and CBC. She had a thyroid test that was normal in January of this year. I see no need to repeat that. I will order a repeat  echocardiogram to see her systolic or diastolic function has declined to see if she has developed right-sided heart failure from her obstructive sleep apnea. Will also followup on cardiology recommendations. I also gave her a prescription for nitroglycerin sublingual but she can try instead of frequent aspirin as she is anemic and I do not want her to develop any additional gastritis.

## 2011-03-10 ENCOUNTER — Encounter: Payer: Self-pay | Admitting: Cardiology

## 2011-03-10 ENCOUNTER — Ambulatory Visit (INDEPENDENT_AMBULATORY_CARE_PROVIDER_SITE_OTHER): Payer: Self-pay | Admitting: Cardiology

## 2011-03-10 ENCOUNTER — Telehealth: Payer: Self-pay | Admitting: *Deleted

## 2011-03-10 DIAGNOSIS — I2510 Atherosclerotic heart disease of native coronary artery without angina pectoris: Secondary | ICD-10-CM

## 2011-03-10 DIAGNOSIS — R55 Syncope and collapse: Secondary | ICD-10-CM

## 2011-03-10 DIAGNOSIS — I10 Essential (primary) hypertension: Secondary | ICD-10-CM

## 2011-03-10 DIAGNOSIS — R609 Edema, unspecified: Secondary | ICD-10-CM

## 2011-03-10 DIAGNOSIS — E785 Hyperlipidemia, unspecified: Secondary | ICD-10-CM

## 2011-03-10 NOTE — Assessment & Plan Note (Signed)
I do not strongly suspect a cardiac etiology but I will start with a 21 day event monitor.

## 2011-03-10 NOTE — Assessment & Plan Note (Signed)
We had a long discussion about volume management. She likes to drink a lot of water so she doesn't get dehydrated. However, she has edema and takes a diuretic. I reviewed very strictly salt and fluid restriction with her and hopefully she can comply.

## 2011-03-10 NOTE — Assessment & Plan Note (Signed)
She had nonobstructive coronary disease on catheter in 2007. She does have some worrisome features but he has been a chronic pattern for 2 years. At this point I think screening stress perfusion imaging would be indicated. She could not walk the treadmill so we'll have pharmacologic testing. She needs continued aggressive risk reduction.

## 2011-03-10 NOTE — Progress Notes (Signed)
HPI The patient has a very complicated past history which includes coronary artery disease and nonobstructive heart disease. She has also had or syncope. I reviewed a hospitalization earlier this year. This was for hypertensive urgency. Her last catheterization was 2007. Most recently she has had recurrent syncope. Her primary provider is suggested that this might be related to hypoglycemia though it hasn't seemed to improve with recent downward adjustment in her diabetes regimen. She is referred for evaluation of this chest discomfort.  Her syncope happened about 3 times per month. It happens randomly. She has not had any injury. She doesn't necessarily feel tachycardia palpitations related to this though she does at times feel her heart tracing. She describes some orthostatic symptoms but not situations with the syncope could be blamed on orthostasis. She does have chest discomfort as described below but her syncope is unrelated to this.  The patient describes chest discomfort. She reports that this has actually been a stable pattern over 2 years. It is different than her previous angina that she described in 2007 though she uses the term angina when recording period she says it happens with activity such as carrying groceries or walking. It is severe lasting several minutes. She will have some arm discomfort. It goes away after she takes sublingual and aspirin. She is awaiting a prescription for sublingual nitroglycerin. She doesn't describe associated nausea vomiting or diaphoresis. She does get dyspnea with exertion. She has had apparent diastolic dysfunction in the past.  Of note I did review an echocardiogram done yesterday. She had well-preserved ejection fraction. There were no significant abnormalities. There was some suggestion of diastolic dysfunction.  Allergies  Allergen Reactions  . Diphenhydramine Hcl     REACTION: tachycardia  . Lisinopril Other (See Comments)    Cough secondary to ACE   . Oxycodone-Acetaminophen     REACTION: heart races    Current Outpatient Prescriptions  Medication Sig Dispense Refill  . amLODipine (NORVASC) 10 MG tablet Take 1 tablet (10 mg total) by mouth daily.  30 tablet  30  . aspirin 325 MG tablet Take 325 mg by mouth as needed.        Marland Kitchen aspirin 81 MG EC tablet Take 81 mg by mouth daily.        Marland Kitchen atorvastatin (LIPITOR) 40 MG tablet Take 40 mg by mouth daily.        . chlorpheniramine-phenylephrine-dextromethorphan (RONDEC DM) 12.02-04-14 MG/5ML SYRP Take by mouth. Take 1-2 teaspoons every 6-8 hours as needed for cough       . cloNIDine (CATAPRES) 0.2 MG tablet Take 1 tablet (0.2 mg total) by mouth 2 (two) times daily.  60 tablet  11  . COD LIVER OIL PO Take 1 capsule by mouth daily.        . cyclobenzaprine (FLEXERIL) 5 MG tablet Take 5 mg by mouth 3 (three) times daily as needed.        . furosemide (LASIX) 40 MG tablet Take 1 tablet (40 mg total) by mouth daily.  30 tablet  2  . gabapentin (NEURONTIN) 300 MG capsule Take 300 mg by mouth 2 (two) times daily.        Marland Kitchen glucose blood (BLOOD GLUCOSE TEST STRIPS) test strip Freestyle test strips- use as instructed to check blood sugar  100 each  12  . hydrochlorothiazide 25 MG tablet Take 25 mg by mouth daily.        Marland Kitchen ibuprofen (ADVIL,MOTRIN) 800 MG tablet Take 800 mg by mouth every  6 (six) hours as needed. pain       . insulin NPH-insulin regular (NOVOLIN 70/30) (70-30) 100 UNIT/ML injection 35 units SQ in morning before breakfast 25 units SQ in evening before supper  10 mL  11  . Insulin Syringe-Needle U-100 (INSULIN SYRINGE .5CC/28G) 28G X 1/2" 0.5 ML MISC as directed.        . irbesartan (AVAPRO) 150 MG tablet Take 150 mg by mouth daily.        . Lancets 30G MISC Use to check blood sugar twice a day       . loratadine (CLARITIN) 10 MG tablet Take 10 mg by mouth 2 (two) times daily as needed. For allergies       . metFORMIN (GLUCOPHAGE) 1000 MG tablet Take 1 tablet (1,000 mg total) by mouth 2 (two)  times daily with meals.  60 tablet  11  . metoprolol (TOPROL-XL) 100 MG 24 hr tablet Take 1 tablet (100 mg total) by mouth daily.  30 tablet  11  . Multiple Vitamins-Minerals (CENTRUM PO) Take 1 tablet by mouth daily.        . nitroGLYCERIN (NITROSTAT) 0.4 MG SL tablet Place 1 tablet (0.4 mg total) under the tongue every 5 (five) minutes as needed for chest pain.  30 tablet  2  . polyethylene glycol (MIRALAX / GLYCOLAX) packet Take 34 g by mouth daily.        . potassium chloride SA (K-DUR,KLOR-CON) 20 MEQ tablet Take 1 tablet (20 mEq total) by mouth daily.  30 tablet  11  . DISCONTD: gabapentin (NEURONTIN) 300 MG capsule Take 1 capsule (300 mg total) by mouth 3 (three) times daily.  90 capsule  11  . DISCONTD: insulin NPH-insulin regular (NOVOLIN 70/30) (70-30) 100 UNIT/ML injection 35 units SQ in morning before breakfast 30 units SQ in evening before supper  30 mL  3  . DISCONTD: rosuvastatin (CRESTOR) 20 MG tablet Take 1 tablet (20 mg total) by mouth at bedtime.  30 tablet  11   Current Facility-Administered Medications  Medication Dose Route Frequency Provider Last Rate Last Dose  . DISCONTD: 0.9 %  sodium chloride infusion  500 mL Intravenous Continuous Rob Bunting, MD        Past Medical History  Diagnosis Date  . Chest pain 11/07    Dr Jens Som cath : LAD luminal irregularities, ramus intermediate with superior branch 70% stenosis, circumflex 50% stenosis, marginal 50% stenosis, well preserved ejection fraction.  . Diabetes mellitus type II     Insulin dependent. Has worked with Lupita Leash R previously.  Marland Kitchen Hypertension     Requires 5 drug therapy and still difficult to control.  Marland Kitchen GERD (gastroesophageal reflux disease)   . Hyperlipidemia     On daily statin  . Obesity     Morbid. HAs worked with Lupita Leash T previously.  . Colonic polyp 1995    Colonoscopies 1994, 2000, and 02/02/2011. Last had 9 polyps - path pending  . OSA (obstructive sleep apnea) 02/16/2007    Moderate AHI 35.6, O2  sat decreased to 65%, CPAP titration to 16 with AHI of 3.6.  Marland Kitchen Chronic pain     MR 08/07/10 :  Spondylosis L4-5 with B foraminal narrowing and L4 nerve root enchroachment B.  . Seasonal allergies     seasonal  . 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 S/p masctemoy and chemo.  . Iron deficiency anemia     Ferritin  was 12 in 2012. on FeSo4. Has had colon and EGD 02/02/11 that showed mild gastritis and colon polyps.    Past Surgical History  Procedure Date  . Mastectomy 2006    For breast CA  . Colonoscopy w/ polypectomy 1994, 2000, 2012  . Total abdominal hysterectomy 1985  . Carotid dopplers 08/24/2007    Normal. Done 2/2 dizziness.  . Cardiac catheterization 2007    Non obstructing CAD - [place]    Family History  Problem Relation Age of Onset  . Depression Daughter 37  . Osteoarthritis Maternal Grandmother   . Lung cancer Mother   . Diabetes Father   . Breast cancer Sister   . Pulmonary embolism Brother     History   Social History  . Marital Status: Divorced    Spouse Name: N/A    Number of Children: N/A  . Years of Education: N/A   Occupational History  . Not on file.   Social History Main Topics  . Smoking status: Never Smoker   . Smokeless tobacco: Not on file  . Alcohol Use: No  . Drug Use: No  . Sexually Active: Not on file   Other Topics Concern  . Not on file   Social History Narrative   Works at [employer] par time. Single, lives by self.    ZOX:WRUE positive for wheezing, cough, reflux, or edema. Otherwise as stated in the history of present illness negative for all other systems.  PHYSICAL EXAM BP 135/69  Pulse 80  Resp 18  Ht 5\' 1"  (1.549 m)  Wt 222 lb (100.699 kg)  BMI 41.95 kg/m2 GENERAL:  Well appearing HEENT:  Pupils equal round and reactive, fundi not visualized, oral mucosa unremarkable NECK:  No jugular venous distention, waveform within normal limits, carotid upstroke brisk  and symmetric, no bruits, no thyromegaly LYMPHATICS:  No cervical, inguinal adenopathy LUNGS:  Clear to auscultation bilaterally BACK:  No CVA tenderness CHEST:  Unremarkable HEART:  PMI not displaced or sustained,S1 and S2 within normal limits, no S3, no S4, no clicks, no rubs, no murmurs ABD:  Flat, positive bowel sounds normal in frequency in pitch, no bruits, no rebound, no guarding, no midline pulsatile mass, no hepatomegaly, no splenomegaly, obese EXT:  2 plus pulses throughout, diffuse upper and lower mild extremity edema, no cyanosis no clubbing SKIN:  No rashes no nodules NEURO:  Cranial nerves II through XII grossly intact, motor grossly intact throughout [facility]:  Cognitively intact, oriented to person place and time   EKG: Sinus rhythm, rate 81, axis within normal limits, intervals within normal limits, no acute ST-T wave changes.  (12/31/10)  ASSESSMENT AND PLAN

## 2011-03-10 NOTE — Telephone Encounter (Signed)
Thank you. I think Myriam Jacobson had already called in though.

## 2011-03-10 NOTE — Telephone Encounter (Signed)
Pt stated she did not receive rx for Lasix and NTG tabs yesterday at her appt.  Rxs. Called in to [facility].

## 2011-03-10 NOTE — Assessment & Plan Note (Signed)
The blood pressure is at target. No change in medications is indicated. We will continue with therapeutic lifestyle changes (TLC).  

## 2011-03-10 NOTE — Patient Instructions (Signed)
You are being scheduled for a Lexiscan stress test and a 21 day event monitor. You will be called with your results. Please continue your current medications. Please restrict your fluid intake to 1,500 cc of fluid a day Please limit your Sodium intake to no more than 2 grams a day. (Handout provided)

## 2011-03-12 ENCOUNTER — Other Ambulatory Visit: Payer: Self-pay | Admitting: *Deleted

## 2011-03-15 ENCOUNTER — Encounter: Payer: Self-pay | Admitting: Internal Medicine

## 2011-03-16 ENCOUNTER — Other Ambulatory Visit (HOSPITAL_COMMUNITY): Payer: Self-pay | Admitting: Radiology

## 2011-03-17 ENCOUNTER — Encounter: Payer: Self-pay | Admitting: Internal Medicine

## 2011-03-17 ENCOUNTER — Ambulatory Visit (INDEPENDENT_AMBULATORY_CARE_PROVIDER_SITE_OTHER): Payer: Self-pay | Admitting: Internal Medicine

## 2011-03-17 DIAGNOSIS — Z0000 Encounter for general adult medical examination without abnormal findings: Secondary | ICD-10-CM

## 2011-03-17 DIAGNOSIS — I10 Essential (primary) hypertension: Secondary | ICD-10-CM

## 2011-03-17 DIAGNOSIS — R609 Edema, unspecified: Secondary | ICD-10-CM

## 2011-03-17 DIAGNOSIS — E119 Type 2 diabetes mellitus without complications: Secondary | ICD-10-CM

## 2011-03-17 DIAGNOSIS — Z23 Encounter for immunization: Secondary | ICD-10-CM

## 2011-03-17 LAB — GLUCOSE, CAPILLARY: Glucose-Capillary: 190 mg/dL — ABNORMAL HIGH (ref 70–99)

## 2011-03-17 MED ORDER — POTASSIUM CHLORIDE CRYS ER 20 MEQ PO TBCR: 20.0000 meq | EXTENDED_RELEASE_TABLET | Freq: Every day | ORAL | Status: DC

## 2011-03-17 NOTE — Patient Instructions (Addendum)
Schedule a follow up appointment with Dr. Rogelia Boga at her next available spot in the next 2-4 weeks. Please review information about the pneumonia vaccine. Continue to take all your medications as directed. I will contact you if any of her lab work is abnormal. If you develop any chest pain, shortness of breath,  worsening edema, or other concerning symptom, please call the clinic at [phone] or go immediately to the emergency room.  Pneumonia Vaccine IMPORTANT: HOW TO USE THIS INFORMATION:  This is a summary and does NOT have all possible information about this product. This information does not assure that this product is safe, effective, or appropriate for you. This information is not individual medical advice and does not substitute for the advice of your health care professional. Always ask your health care professional for complete information about this product and your specific health needs.    PNEUMOCOCCAL VACCINE - INJECTION (NEU-mo-KOK-al)    COMMON BRAND NAME(S): Pneumovax 23, Pnu-Imune 23    USES:  This vaccine helps protect against serious infection (e.g., meningitis, bacteria in the blood) due to certain bacteria (Streptococcus pneumoniae). This vaccine is important for preventing infection in individuals at risk (e.g., those with heart disease, lung disease, liver disease, diabetes, alcoholism, spleen problems, sickle cell anemia, or HIV, or those living in a nursing home).    HOW TO USE:  Read the Vaccine Information Statement available from your health care provider before receiving the vaccine. If you have any questions, consult your health care provider. This vaccine is injected into a muscle or under the skin by a health care professional. When this vaccine is injected into a muscle, it is given in the upper arm or thigh. You may need to have another dose of vaccine if you are still at high risk for infection. This is especially true if you are younger than 60 years old when you  receive the first dose of this vaccine. A second dose should not be given until 5 years after your first dose. Ask your doctor for more details. If you are receiving this vaccination before spleen surgery or before receiving cancer chemotherapy or other drugs that decrease your immune system function, it should be given at least 2 weeks before these procedures to be effective. Talk to your doctor or pharmacist for more information.    SIDE EFFECTS:  Injection site reactions (e.g., pain, redness, swelling, hard lump), muscle/joint aches, or fever may occur. Ask your doctor whether you should take a fever/pain reducer (e.g., acetaminophen) to help treat these symptoms. Nausea and vomiting may also occur. If any of these effects persist or worsen, tell your doctor or pharmacist promptly. Remember that your doctor has prescribed this medication because he or she has judged that the benefit to you is greater than the risk of side effects. Many people using this medication do not have serious side effects. Tell your doctor immediately if any of these unlikely but serious side effects occur: unusual weakness, tingling/numbness of the hands/feet, easy bleeding/bruising, swollen glands. A very serious allergic reaction to this drug is rare. However, seek immediate medical attention if you notice any symptoms of a serious allergic reaction, including: rash, itching/swelling (especially of the face/tongue/throat), severe dizziness, trouble breathing. This is not a complete list of possible side effects. If you notice any other effects not listed, contact your doctor or pharmacist. Contact your doctor for medical advice about side effects. The following numbers do not provide medical advice, but in the [place], you may  report side effects to the Vaccine Adverse Event Reporting System (VAERS) at [phone]. In [place], you may report side effects to [facility] [place] at [phone].    PRECAUTIONS:  Before receiving this  vaccine, tell your doctor or pharmacist if you are allergic to it; or if you have any other allergies. This product may contain inactive ingredients (such as the preservative phenol, latex or dry natural rubber in the vial stopper), which can cause allergic reactions or other problems. Talk to your pharmacist for more details. Before using this medication, tell your doctor or pharmacist your medical history, especially of: your vaccination/immunization history, recent illness/fever. This vaccine is not recommended for use in children younger than 2 years. During pregnancy, this medication should be used only when clearly needed. Discuss the risks and benefits with your doctor. It is not known whether this drug passes into breast milk. Consult your doctor before breast-feeding.    DRUG INTERACTIONS:  Your doctor or pharmacist may already be aware of any possible drug interactions and may be monitoring you for them. Do not start, stop, or change the dosage of any medicine before checking with your doctor or pharmacist first. Before using this medication, tell your doctor or pharmacist of all prescription and nonprescription/herbal products you may use, especially of: "blood thinners" (e.g., warfarin or heparins), cancer chemotherapy drugs, corticosteroids (e.g., dexamethasone, prednisone), drugs that weaken your immune system (e.g., cyclosporine, efalizumab, tacrolimus). This vaccine should not be given at the same time as the shingles (zoster) vaccine because the shingles vaccine may not work as well. Ask your doctor when it is best to receive these vaccinations. This document does not contain all possible interactions. Therefore, before using this product, tell your doctor or pharmacist of all the products you use. Keep a list of all your medications with you, and share the list with your doctor and pharmacist.    OVERDOSE:  If overdose is suspected, contact your local poison control center or emergency room  immediately. [place] residents can call the [place] National Poison Hotline at (425) 788-0922. [place] residents can call a Technical sales engineer center.    NOTES:  It is important to understand the risks and benefits of vaccinations. Discuss this with your doctor. Talk to your doctor or pharmacist about the need for other vaccines to prevent possibly severe illness (e.g., flu shots). Make sure all of your doctors know you have received this vaccine. Make sure a note is placed in your medical record of having received this vaccine.    MISSED DOSE:  Not applicable.    STORAGE:  Not applicable. This vaccine is given in a doctor's office or clinic and will not be stored at home.    Information last revised August 2010 Copyright(c) 2010 [employer].

## 2011-03-17 NOTE — Progress Notes (Signed)
  Subjective:    Patient ID: [name], female    DOB: 02-Jul-1951, 60 y.o.   MRN: [id number]  HPI  is [name] is a 60 year old female who presents today for followup of her lower extremity edema.   Pt feels her lower extremity edema is improved from her last visit.  She states she is breathing much better as well.  She reports home weights of 224.5lb; this is unchanged from her weight obtained at her last OV on 03/09/11.  She has a stress test scheduled on the 18th.  She denies chest pain, worsening SOB/DOE.  She has not used any of her NTG; she was unable to afford this medication.  She is currently using 35uof insulin 70/30 in the am and 25u at night.  REview of her glucometer reading reveal CBGs ranging from 120-220 with an average of 160.  She has had no hypoglycemic events.  She is currently working with Quay Burow, a disability advocate, to re-apply for social security disability; she was previously denied.  Her application has been resubmitted.  She has no other concerns or complaints Review of Systems  Constitutional: Negative for fever, chills, diaphoresis, activity change, appetite change, fatigue and unexpected weight change.  HENT: Negative for congestion and neck pain.   Respiratory: Negative for cough, chest tightness, wheezing and stridor.   Cardiovascular: Negative for chest pain and palpitations.  Gastrointestinal: Negative for abdominal pain and blood in stool.  Genitourinary: Negative for dysuria.  Musculoskeletal: Negative for back pain, joint swelling and gait problem.  Neurological: Negative for dizziness, syncope, numbness and headaches.   [phone] (daughter, Foye Spurling), [phone] (cousin Estella)     Objective:   Physical Exam VItal signs reviewed and stable. GEN: No apparent distress.  Alert and oriented x 3.  Pleasant, conversant, and cooperative to exam. HEENT: head is autraumatic and normocephalic.  Neck is supple without palpable masses or lymphadenopathy.   No JVD or carotid bruits.  Vision intact.  EOMI.  PERRLA.  Sclerae anicteric.  Conjunctivae without pallor or injection. Mucous membranes are moist.  Oropharynx is without erythema, exudates, or other abnormal lesions.  Dentition is poor with numerous teeth missing. RESP:  Lungs are clear to ascultation bilaterally with good air movement.  No wheezes, ronchi, or rubs. CARDIOVASCULAR: regular rate, normal rhythm.  Clear S1, S2, no gallops, or rubs. ABDOMEN: soft, non-tender, non-distended.  Bowels sounds present in all quadrants and normoactive.  No palpable masses. EXT: warm and dry.  Peripheral pulses equal, intact, with +1  posterior tibial and dorsalis pedis pulses.  No clubbing or cyanosis.  +1 pitting edema in bilateral lower extremities. SKIN: warm and dry with normal turgor.  No rashes or abnormal lesions observed. NEURO: CN II-XII grossly intact.   No focal deficit.        Assessment & Plan:

## 2011-03-18 LAB — BASIC METABOLIC PANEL
Chloride: 100 mEq/L (ref 96–112)
Potassium: 3.4 mEq/L — ABNORMAL LOW (ref 3.5–5.3)
Sodium: 138 mEq/L (ref 135–145)

## 2011-03-18 NOTE — Assessment & Plan Note (Signed)
Reviewed the risks and benefits of pneumococcal vaccination. Patient agreed to vaccination; Pneumovax administered today.

## 2011-03-18 NOTE — Assessment & Plan Note (Signed)
Per patient her lower extremity edema is significantly improved although she has +2 pitting edema on exam. She does not have JVD, crackles on respiratory exam, or other findings on exam or by history to suggest acute failure.  She is tolerating Lasix well. We'll plan to check a basic metabolic panel today and continue her Lasix at 40 mg by mouth daily. She is advised to return to clinic or to go to the emergency room should she develop any shortness of breath, dyspnea on exertion, chest pain, syncope or other concerning complaint. We reviewed the appropriate use of nitroglycerin; patient states she will be able to fill this medication in the next few days. I encouraged her to do so and to be sure she follows up with her cardiac stress test.

## 2011-03-18 NOTE — Assessment & Plan Note (Addendum)
Patient appears to be doing well with her insulin 70/30 and metformin. As noted in the history of present illness she is not having any hypoglycemic events or significant hyperglycemia.  At this time there are no indications to change her insulin dosing.  Given her diabetes she needs recommended criteria for pneumococcal vaccination. We discussed the risks and benefits of the pneumococcal vaccine and patient agreed to vaccination; we will administer this today.

## 2011-03-22 ENCOUNTER — Ambulatory Visit (HOSPITAL_COMMUNITY): Payer: Self-pay | Attending: Cardiology | Admitting: Radiology

## 2011-03-22 ENCOUNTER — Encounter (INDEPENDENT_AMBULATORY_CARE_PROVIDER_SITE_OTHER): Payer: Self-pay

## 2011-03-22 VITALS — Ht 61.0 in | Wt 223.0 lb

## 2011-03-22 DIAGNOSIS — R0602 Shortness of breath: Secondary | ICD-10-CM

## 2011-03-22 DIAGNOSIS — R0989 Other specified symptoms and signs involving the circulatory and respiratory systems: Secondary | ICD-10-CM

## 2011-03-22 DIAGNOSIS — R55 Syncope and collapse: Secondary | ICD-10-CM

## 2011-03-22 DIAGNOSIS — E119 Type 2 diabetes mellitus without complications: Secondary | ICD-10-CM

## 2011-03-22 DIAGNOSIS — I2510 Atherosclerotic heart disease of native coronary artery without angina pectoris: Secondary | ICD-10-CM | POA: Insufficient documentation

## 2011-03-22 DIAGNOSIS — R0789 Other chest pain: Secondary | ICD-10-CM

## 2011-03-22 MED ORDER — TECHNETIUM TC 99M TETROFOSMIN IV KIT
33.0000 | PACK | Freq: Once | INTRAVENOUS | Status: AC | PRN
  Administered 2011-03-22: 33 via INTRAVENOUS

## 2011-03-22 MED ORDER — TECHNETIUM TC 99M TETROFOSMIN IV KIT
11.0000 | PACK | Freq: Once | INTRAVENOUS | Status: AC | PRN
  Administered 2011-03-22: 11 via INTRAVENOUS

## 2011-03-22 MED ORDER — REGADENOSON 0.4 MG/5ML IV SOLN
0.4000 mg | Freq: Once | INTRAVENOUS | Status: AC
  Administered 2011-03-22: 0.4 mg via INTRAVENOUS

## 2011-03-22 NOTE — Progress Notes (Addendum)
[facility] SITE 3 NUCLEAR MED [address] [place] [address] [phone]  Cardiology Nuclear Med Study  [name] is a 60 y.o. female [id number] 1950-10-20   Nuclear Med Background Indication for Stress Test:  Evaluation for Ischemia History: 10/2010 CT/MRI, 03/09/11 Echo: EF 55-60%, '07 Heart Catheterization: N/O CAD EF 60%, 06/09 Myocardial Perfusion Study: mild distal anterior ischemia low risk EF 56% and Eextensive coronary calcifications seen on CT Cardiac Risk Factors: Hypertension, IDDM Type 2 and Lipids  Symptoms:  Chest Pain, Dizziness, DOE, Fatigue with Exertion, Light-Headedness and Syncope   Nuclear Pre-Procedure Caffeine/Decaff Intake:  None NPO After: 2 small bites of oatmeal @ 6am due to low BS 84 @home . No Metformin this am. BS of 96 @ 0830.   Lungs:  clear IV 0.9% NS with Angio Cath:  22g  IV Site: R Wrist  IV Started by:  Cathlyn Parsons, RN  Chest Size (in):  44 Cup Size: B  Height: 5\' 1"  (1.549 m)  Weight:  223 lb (101.152 kg)  BMI:  Body mass index is 42.14 kg/(m^2). Tech Comments:  Metoprolol held x  24 hrs    Nuclear Med Study 1 or 2 day study: 1 day  Stress Test Type:  Eugenie Birks  Reading MD: Marca Ancona, MD  Order Authorizing Provider:  J.Hochrein  Resting Radionuclide: Technetium 23m Tetrofosmin  Resting Radionuclide Dose: 11.0 mCi   Stress Radionuclide:  Technetium 80m Tetrofosmin  Stress Radionuclide Dose: 33.0 mCi           Stress Protocol Rest HR: 62 Stress HR: 98  Rest BP: 110/61 Stress BP: 119/56  Exercise Time (min): n/a METS: n/a   Predicted Max HR: 161 bpm % Max HR: 60.87 bpm Rate Pressure Product: 21308   Dose of Adenosine (mg):  n/a Dose of Lexiscan: 0.4 mg  Dose of Atropine (mg): n/a Dose of Dobutamine: n/a mcg/kg/min (at max HR)  Stress Test Technologist: Frederick Peers, EMT-P  Nuclear Technologist:  Domenic Polite, CNMT     Rest Procedure:  Myocardial perfusion imaging was performed at rest  45 minutes following the intravenous administration of Technetium 27m Tetrofosmin. Rest ECG: NSR  Stress Procedure:  The patient received IV Lexiscan 0.4 mg over 15-seconds.  Technetium 41m Tetrofosmin injected at 30-seconds.  There were no significant changes with Lexiscan.  Quantitative spect images were obtained after a 45 minute delay. Stress ECG: No significant change from baseline ECG  QPS Raw Data Images:  Normal; no motion artifact; normal heart/lung ratio. Stress Images:  Small apical perfusion defect.  Rest Images:  Small apical perfusion defect.  Subtraction (SDS):  Small fixed apical perfusion defect.  Transient Ischemic Dilatation (Normal <1.22):  1.08 Lung/Heart Ratio (Normal <0.45):  0.30  Quantitative Gated Spect Images QGS EDV:  86 ml QGS ESV:  30 ml QGS cine images:  Normal Wall Motion QGS EF: 65%  Impression Exercise Capacity:  Lexiscan with no exercise. BP Response:  Normal blood pressure response. Clinical Symptoms:  Chest tightness.  ECG Impression:  No significant ST segment change suggestive of ischemia. Comparison with Prior Nuclear Study: No significant change from previous study  Overall Impression:  Small fixed apical perfusion defect with normal wall motion.  Suspect this is artifact (apical thinning versus breast attenuation).  No change from prior study.  No evidence for ischemia or infarction.   [name] Shirlee Latch   No evidence of ischemia.  No further testing indicated. Rollene Rotunda

## 2011-03-23 NOTE — Progress Notes (Signed)
Copy routed to Dr. Blount Lions.Scarlette Ar

## 2011-03-23 NOTE — Progress Notes (Signed)
Pt does not work at phone number listed as work number.  Other phone number is not accepting calls at this time.

## 2011-03-24 ENCOUNTER — Other Ambulatory Visit: Payer: Self-pay | Admitting: *Deleted

## 2011-03-24 DIAGNOSIS — I10 Essential (primary) hypertension: Secondary | ICD-10-CM

## 2011-03-24 MED ORDER — AMLODIPINE BESYLATE 10 MG PO TABS: 10.0000 mg | ORAL_TABLET | Freq: Every day | ORAL | Status: DC

## 2011-03-24 MED ORDER — GABAPENTIN 300 MG PO CAPS: 300.0000 mg | ORAL_CAPSULE | Freq: Two times a day (BID) | ORAL | Status: DC

## 2011-03-24 NOTE — Progress Notes (Signed)
"  Person at this number is not accepting phone calls right now"

## 2011-03-24 NOTE — Telephone Encounter (Signed)
Faxed to the [facility]. 

## 2011-04-01 ENCOUNTER — Encounter: Payer: Self-pay | Admitting: *Deleted

## 2011-04-01 NOTE — Progress Notes (Signed)
Unable to reach pt by telephone again today.  Letter mailed of results to pts home address

## 2011-04-28 ENCOUNTER — Other Ambulatory Visit: Payer: Self-pay | Admitting: Dietician

## 2011-04-28 ENCOUNTER — Ambulatory Visit (INDEPENDENT_AMBULATORY_CARE_PROVIDER_SITE_OTHER): Payer: Self-pay | Admitting: Dietician

## 2011-04-28 ENCOUNTER — Ambulatory Visit (INDEPENDENT_AMBULATORY_CARE_PROVIDER_SITE_OTHER): Payer: Self-pay | Admitting: Internal Medicine

## 2011-04-28 ENCOUNTER — Encounter: Payer: Self-pay | Admitting: Internal Medicine

## 2011-04-28 VITALS — BP 157/64 | HR 81 | Temp 98.6°F | Resp 20 | Ht 61.0 in | Wt 222.8 lb

## 2011-04-28 DIAGNOSIS — I872 Venous insufficiency (chronic) (peripheral): Secondary | ICD-10-CM | POA: Insufficient documentation

## 2011-04-28 DIAGNOSIS — E119 Type 2 diabetes mellitus without complications: Secondary | ICD-10-CM

## 2011-04-28 DIAGNOSIS — R300 Dysuria: Secondary | ICD-10-CM

## 2011-04-28 LAB — GLUCOSE, CAPILLARY: Glucose-Capillary: 111 mg/dL — ABNORMAL HIGH (ref 70–99)

## 2011-04-28 MED ORDER — WAVESENSE PRESTO W/DEVICE KIT: 1.0000 | PACK | Freq: Three times a day (TID) | Status: DC

## 2011-04-28 MED ORDER — GABAPENTIN 300 MG PO CAPS: 300.0000 mg | ORAL_CAPSULE | Freq: Three times a day (TID) | ORAL | Status: DC

## 2011-04-28 MED ORDER — GLUCOSE BLOOD VI STRP: ORAL_STRIP | Status: DC

## 2011-04-28 MED ORDER — CIPROFLOXACIN HCL 250 MG PO TABS: 250.0000 mg | ORAL_TABLET | Freq: Two times a day (BID) | ORAL | Status: DC

## 2011-04-28 NOTE — Progress Notes (Signed)
  Subjective:    Patient ID: [name], female    DOB: 31-Aug-1951, 60 y.o.   MRN: [id number]  HPI  Please see the A&P for the status of the pt's chronic medical problems.   Review of Systems  Constitutional: Positive for activity change, fatigue and unexpected weight change. Negative for appetite change.  HENT: Positive for facial swelling.   Eyes: Positive for visual disturbance.  Respiratory: Positive for chest tightness and shortness of breath.   Cardiovascular: Positive for chest pain and leg swelling.  Gastrointestinal: Positive for abdominal distention.  Genitourinary: Positive for difficulty urinating.  Musculoskeletal: Positive for back pain and gait problem.  Skin: Positive for color change.  Neurological: Positive for dizziness.       Objective:   Physical Exam  Constitutional: She is oriented to person, place, and time. She appears well-developed and well-nourished. No distress.  HENT:  Head: Normocephalic and atraumatic.  Right Ear: External ear normal.  Left Ear: External ear normal.  Nose: Nose normal.  Eyes: Conjunctivae are normal.  Cardiovascular: Normal rate and regular rhythm.   Pulmonary/Chest: Effort normal and breath sounds normal.  Abdominal: Soft.       No pitting edema of abd wall  Musculoskeletal: Normal range of motion.       +2 pitting edema of the lower extremeties B up to knees  Neurological: She is alert and oriented to person, place, and time.  Skin: Skin is warm and dry. No rash noted. She is not diaphoretic. No erythema. No pallor.  Psychiatric: She has a normal mood and affect. Her behavior is normal. Judgment and thought content normal.          Assessment & Plan:

## 2011-04-28 NOTE — Telephone Encounter (Signed)
CDE will call in Meter and strips to [facility]

## 2011-04-28 NOTE — Telephone Encounter (Signed)
Could not be sent electronically. Would you pls phone in? Thanks

## 2011-04-28 NOTE — Patient Instructions (Signed)
Check expiration date on test strips and Please discard strips that are out of date for Freestyle meter.  Can get meter and strips at health department- [facility]- we have called a prescription there for you.   Please take insulin 30 minutes before meals UNLESS your blood sugar is less than 100, If blood sugar is less than 100 do not wait, eat  after taking insulin.  You will not be as likely to have low blood sugars when CARBS are evenly spread out over day. Look forward to seeing you and your daughter in CARB Class!  Lupita Leash [phone]

## 2011-04-28 NOTE — Patient Instructions (Signed)
Please see me in 1-3 months.  Stay as active as you can. Elevate your feet as often as you can. Use stockings as often as you can - put them on first thing in the AM.

## 2011-04-28 NOTE — Progress Notes (Signed)
Diabetes Self-Management Training (DSMT)  Follow-Up 4 Visit  04/28/2011 Ms. [name], identified by name and date of birth, is a 60 y.o. female with Type 2 Diabetes. Year of diabetes diagnosis: 2006 Other persons present: no  ASSESSMENT Patient concerns are hypoglycemia.  There were no vitals taken for this visit. There is no height or weight on file to calculate BMI. Lab Results  Component Value Date   LDLCALC  Value: 135        Total Cholesterol/HDL:CHD Risk Coronary Heart Disease Risk Table                     Men   Women  1/2 Average Risk   3.4   3.3  Average Risk       5.0   4.4  2 X Average Risk   9.6   7.1  3 X Average Risk  23.4   11.0        Use the calculated Patient Ratio above and the CHD Risk Table to determine the patient's CHD Risk.        ATP III CLASSIFICATION (LDL):  <100     mg/dL   Optimal  409-811  mg/dL   Near or Above                    Optimal  130-159  mg/dL   Borderline  914-782  mg/dL   High  >956     mg/dL   Very High* 11/17/863   Lab Results  Component Value Date   HGBA1C 7.4 04/28/2011   Medication Nutrition Monitor: old freestyle meter with strips with expiration date 2011, advised patient to get new meter and strips at health department  Labs reviewed.  DIABETES BUNDLE: A1C in past 6 months? Yes.  Less than 7%? No LDL in past year? Yes.  Less than 100 mg/dL? No Microalbumin ratio in past year? No. Patient taking ACE or ARB? Yes. Blood pressure less than 130/80? No.   Foot exam in last year? Yes. Eye exam in past year? No- doctor ordered referral today. Tobacco use? No. Pneumovax? Yes Flu vaccine? No Asprin? Yes  Family history of diabetes: Yes  Support systems: daughter and grandson with whom she resides  Special needs: Simplified materials  Prior DM Education: Yes   Medications See Medications list.  Is interested in learning more. taking insulin ~ 1 hour prior to eating- suggested she not wait to eat if blood sugar less than 100  mg/dl  Patients belief/attitude about diabetes: Diabetes can be controlled.  Self foot exams daily: Yes  Diabetes Complications: Neuropathy Heart disease   Exercise Plan Doing ADLs  and sedentary acitivites reported today.   Self-Monitoring Frequency of testing: 2-3 times/day Would not rely on home blood sugar values with current meter and strips being out of date. A1c rising with most recent 7.4% indicating most likely a post prandial problem.  Hyperglycemia: No Hypoglycemia: Yes Rarely- ~ 1x/month blacks out  Meal Planning Some knowledge and Interested in improving- she a daughter to sign up for carb class next month   Assessment comments: discussed glucagon injections with patient and physician and prevention and proper treatment for low blood sugars    INDIVIDUAL DIABETES EDUCATION PLAN:  Medication Acute Complications _______________________________________________________________________  Intervention TOPICS COVERED TODAY:  Medication- discussed timing of medication and food to decrease chance of low blood sugar.   PATIENTS GOALS/PLAN (copy and paste in patient instructions so patient receives a copy): 1.  Learning Objective:       Know proper treatment of hypoglycemia 2.  Behavioral Objective:         Medications: To improve blood glucose levels, I will take my medication as prescribed Half of the time 50% Reducing Risk: To decrease the risk for complications, I will treat hypoglycemia with 15 grams of carbs if blood glucose less than 70 mg/dl  Half of the time 16%  Personalized Follow-Up Plan for Ongoing Self Management Support:  Doctor's Office, family and CDE visits ______________________________________________________________________   Outcomes Expected outcomes: Demonstrated interest in learning.Expect positive changes in lifestyle.  Self-care Barriers: Lack of material resources  Education material provided: class information  Patient to contact team  via Phone if problems or questions.  Time in: 1530     Time out: 1630  Future DSMT - 4-6 wks   RILEY,DONNA

## 2011-04-29 LAB — MICROALBUMIN / CREATININE URINE RATIO: Creatinine, Urine: 315.6 mg/dL

## 2011-04-29 MED ORDER — CIPROFLOXACIN HCL 500 MG PO TABS: 500.0000 mg | ORAL_TABLET | Freq: Two times a day (BID) | ORAL | Status: AC

## 2011-04-29 NOTE — Progress Notes (Signed)
Cipro 500 mg , ! PO BID , #6, 0 refills called into [place] [facility] and pt notified. Trixie Dredge, 04/29/2011, 8: 49A

## 2011-04-29 NOTE — Telephone Encounter (Signed)
Rxs called to [facility] pharmacy.

## 2011-04-30 ENCOUNTER — Encounter: Payer: Self-pay | Admitting: Internal Medicine

## 2011-04-30 DIAGNOSIS — R300 Dysuria: Secondary | ICD-10-CM | POA: Insufficient documentation

## 2011-04-30 NOTE — Assessment & Plan Note (Signed)
She mentioned this after my visit was completed. She had already given her urine sample which was not a clean catch since I was only getting a urine microalbumin. Therefore I was unable to check a UA. She states that she has dysuria with burning sensation and discomfort. She has no other symptoms including pelvic pain or vaginal discharge. She had a urinary tract infection earlier this year. I wanted to treat empirically with Cipro 250 by mouth twice a day. However the health department only had 500 mg Cipro and they could not be divided in half so I gave her 500 mg by mouth twice a day for 3 days.

## 2011-04-30 NOTE — Assessment & Plan Note (Addendum)
Ms. Culmer complained of increasing weight about 10 pounds over the past 3 weeks, trouble talking due to dyspnea, struggling to breathe, and increased fluid in her legs. She also complains of chest pain when walking. There is a hill near her house and she can go down the hill without any difficulty but walking up the hill she becomes dyspneic and lightheaded and gets chest pain. Her weight gain was substantiated by her clinic record. She had a 10 pound weight gain from early May to mid June.   She states she is on a 3 g sodium diet and is compliant. She also states that she has stockings and uses them only occasionally because they are difficult to get on and are uncomfortable.  She has been worked up rather extensively both by Dr. Arvilla Market and by Dr. Antoine Poche. She has had an echocardiogram that showed an ejection fraction of 55-60% and a normal right ventricle. Her BNP was only 69. Her creatinine, microalbumin, TSH, and LFTs are normal ruling out liver failure, kidney failure, and severe hypothyroidism as a cause of her sxs. Her diagnosis chronic venous insufficiency and I explained that although this is not life-threatening it is certainly unknown name. I explained that we cannot cure it that we can only slightly control the symptoms with diuresis and stockings.  Currently she has no skin breakdown of her lower extremities. She had to decrease her Lasix from 80-40 because she got cramps on the 40 mg. I encouraged her to continue the Lasix, elevate her legs whenever possible, continue the low-salt sodium diet, and to use her stockings whenever possible.  As for her chest pain she had a pharmacological stress test. I cannot find the original copy of the results that I did see a letter from cardiology that was mailed to Mrs. Men stating that it was completely normal.  She was also set up for a 30 day event monitor because she reported some syncopal episodes. However she did not get that done because she did  not have insurance and would have to pay one third of the cost upfront which she does not have.

## 2011-05-05 ENCOUNTER — Other Ambulatory Visit: Payer: Self-pay | Admitting: *Deleted

## 2011-05-05 MED ORDER — CYCLOBENZAPRINE HCL 5 MG PO TABS: 5.0000 mg | ORAL_TABLET | Freq: Three times a day (TID) | ORAL | Status: DC | PRN

## 2011-05-05 NOTE — Telephone Encounter (Signed)
Pt c/o muscle spasms and wants to restart the flexeril, has taken in past.  Also she has edema in feet and legs.  ( this is not new  ) She is wondering is she can cut her norvasc tab in half to see if it will help with the fluid retention.

## 2011-05-05 NOTE — Telephone Encounter (Signed)
Has been on flexeril as recently as 10/2010. Will refill.  I doubt the norvasc is causing the edema but if she wants to try for 1 week she can. If she stays off longer than one week, must have appt with [facility] res to check BP and adjust meds.

## 2011-05-05 NOTE — Telephone Encounter (Signed)
Med called into [facility]. Pt informed and also told about norvasc.  Pt will keep [place] informed.

## 2011-05-18 ENCOUNTER — Ambulatory Visit: Payer: Self-pay | Admitting: Dietician

## 2011-05-25 ENCOUNTER — Ambulatory Visit: Payer: Self-pay | Admitting: Dietician

## 2011-06-09 ENCOUNTER — Encounter: Payer: Self-pay | Admitting: Internal Medicine

## 2011-06-09 ENCOUNTER — Ambulatory Visit (INDEPENDENT_AMBULATORY_CARE_PROVIDER_SITE_OTHER): Payer: Self-pay | Admitting: Internal Medicine

## 2011-06-09 DIAGNOSIS — G5603 Carpal tunnel syndrome, bilateral upper limbs: Secondary | ICD-10-CM

## 2011-06-09 DIAGNOSIS — G609 Hereditary and idiopathic neuropathy, unspecified: Secondary | ICD-10-CM

## 2011-06-09 DIAGNOSIS — I872 Venous insufficiency (chronic) (peripheral): Secondary | ICD-10-CM

## 2011-06-09 DIAGNOSIS — G5600 Carpal tunnel syndrome, unspecified upper limb: Secondary | ICD-10-CM

## 2011-06-09 DIAGNOSIS — I10 Essential (primary) hypertension: Secondary | ICD-10-CM

## 2011-06-09 DIAGNOSIS — G629 Polyneuropathy, unspecified: Secondary | ICD-10-CM

## 2011-06-09 DIAGNOSIS — R609 Edema, unspecified: Secondary | ICD-10-CM

## 2011-06-09 DIAGNOSIS — E119 Type 2 diabetes mellitus without complications: Secondary | ICD-10-CM

## 2011-06-09 LAB — GLUCOSE, CAPILLARY: Glucose-Capillary: 114 mg/dL — ABNORMAL HIGH (ref 70–99)

## 2011-06-09 MED ORDER — GABAPENTIN 300 MG PO CAPS: ORAL_CAPSULE | ORAL | Status: DC

## 2011-06-09 MED ORDER — FUROSEMIDE 40 MG PO TABS: 60.0000 mg | ORAL_TABLET | Freq: Every day | ORAL | Status: DC

## 2011-06-09 NOTE — Progress Notes (Signed)
Subjective:   Patient ID: [name] female   DOB: 10-May-1951 60 y.o.   MRN: [id number]  HPI: [name] is a 60 y.o. woman who presents to clinic today complaining of pain in both of her feet for the last 2-3 weeks.  She states that for the last 2-3 weeks when she walks or sits that she gets swelling in her legs.  They are better in the morning and she has been wearing TED hose but if she is on them for a long time they swell.  She also states that she has pain in both feet that is a burning pain.  "Feels like someone is rubbing my feet with small sharp rocks."  She sleeps at night with her feet uncovered and nothing seems to make the pain better.  The pain is mostly on the bottom of her feet when she walks and also on the top.  When her feet swell the pain gets more intense.  She denies any open wounds, pain in her calf, one leg swelling more then the other, or trauma.   She also states that she is having trouble with numbness and tingling in both hands.  She states that it happens at random intervals and does sometimes wake her up at night.  She states that the numbness and tingling are down both hands and the distal wrists.  She denies any weakness in her hands or wrists or pain in her neck.   Past Medical History  Diagnosis Date  . Chest pain 11/07    Dr Jens Som cath : LAD luminal irregularities, ramus intermediate with superior branch 70% stenosis, circumflex 50% stenosis, marginal 50% stenosis, well preserved ejection fraction.  . Diabetes mellitus type II     Insulin dependent. Has worked with Lupita Leash R previously.  Marland Kitchen Hypertension     Requires 5 drug therapy and still difficult to control.  Marland Kitchen GERD (gastroesophageal reflux disease)   . Hyperlipidemia     On daily statin  . Obesity     Morbid. HAs worked with Lupita Leash T previously.  . Colonic polyp 1995    Colonoscopies 1994, 2000, and 02/02/2011. Last had 9 polyps - path pending  . OSA (obstructive sleep apnea) 02/16/2007   Moderate AHI 35.6, O2 sat decreased to 65%, CPAP titration to 16 with AHI of 3.6.  Marland Kitchen Chronic pain     MR 08/07/10 :  Spondylosis L4-5 with B foraminal narrowing and L4 nerve root enchroachment B.  . Seasonal allergies     seasonal  . 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 S/p masctemoy and chemo.  . Iron deficiency anemia     Ferritin was 12 in 2012. on FeSo4. Has had colon and EGD 02/02/11 that showed mild gastritis and colon polyps.  . Chronic venous insufficiency 2012    Has had full W/U incl ECHO, LFT's, Cr, Umicro, TSH, BNP. Symptomatic treatment.   Current Outpatient Prescriptions  Medication Sig Dispense Refill  . amLODipine (NORVASC) 10 MG tablet Take 1 tablet (10 mg total) by mouth daily.  90 tablet  3  . aspirin 81 MG EC tablet Take 81 mg by mouth daily.        Marland Kitchen atorvastatin (LIPITOR) 40 MG tablet Take 40 mg by mouth daily.        Tyrone Schimke, Vaccinium myrtillus, (BILBERRY PO) Take 1,000 mg by mouth daily.        . Blood Glucose  Monitoring Suppl (WAVESENSE PRESTO) W/DEVICE KIT 1 each by Does not apply route 4 (four) times daily -  before meals and at bedtime.  100 each  11  . chlorpheniramine-phenylephrine-dextromethorphan (RONDEC DM) 12.02-04-14 MG/5ML SYRP Take by mouth. Take 1-2 teaspoons every 6-8 hours as needed for cough       . Cholecalciferol (VITAMIN D3) 400 UNITS CAPS Take 400 Int'l Units by mouth at bedtime.        . cloNIDine (CATAPRES) 0.2 MG tablet Take 1 tablet (0.2 mg total) by mouth 2 (two) times daily.  60 tablet  11  . COD LIVER OIL PO Take 1 capsule by mouth daily.        . cyclobenzaprine (FLEXERIL) 5 MG tablet Take 1 tablet (5 mg total) by mouth every 8 (eight) hours as needed for muscle spasms.  60 tablet  1  . furosemide (LASIX) 40 MG tablet Take 1 tablet (40 mg total) by mouth daily.  30 tablet  2  . gabapentin (NEURONTIN) 300 MG capsule Take 1 capsule (300 mg total) by mouth 3 (three) times daily.  90 capsule  3    . glucose blood test strip wavesense presto- use as instructed to check blood sugar 3x daily before meals & bedtime  100 each  11  . ibuprofen (ADVIL,MOTRIN) 800 MG tablet Take 800 mg by mouth every 6 (six) hours as needed. pain       . insulin NPH-insulin regular (NOVOLIN 70/30) (70-30) 100 UNIT/ML injection 35 units SQ in morning before breakfast 25 units SQ in evening before supper  10 mL  11  . Insulin Syringe-Needle U-100 (INSULIN SYRINGE .5CC/28G) 28G X 1/2" 0.5 ML MISC as directed.        . irbesartan (AVAPRO) 150 MG tablet Take 150 mg by mouth daily.        . Lancets 30G MISC Use to check blood sugar twice a day       . loratadine (CLARITIN) 10 MG tablet Take 10 mg by mouth 2 (two) times daily as needed. For allergies       . metFORMIN (GLUCOPHAGE) 1000 MG tablet Take 1 tablet (1,000 mg total) by mouth 2 (two) times daily with meals.  60 tablet  11  . metoprolol (TOPROL-XL) 100 MG 24 hr tablet Take 1 tablet (100 mg total) by mouth daily.  30 tablet  11  . nitroGLYCERIN (NITROSTAT) 0.4 MG SL tablet Place 1 tablet (0.4 mg total) under the tongue every 5 (five) minutes as needed for chest pain.  30 tablet  2  . polyethylene glycol (MIRALAX / GLYCOLAX) packet Take 34 g by mouth daily.        . potassium chloride SA (K-DUR,KLOR-CON) 20 MEQ tablet Take 1 tablet (20 mEq total) by mouth daily.  30 tablet  3  . vitamin B-12 (CYANOCOBALAMIN) 500 MCG tablet Take 500 mcg by mouth daily.        Marland Kitchen DISCONTD: gabapentin (NEURONTIN) 300 MG capsule Take 1 capsule (300 mg total) by mouth 3 (three) times daily.  90 capsule  11  . DISCONTD: gabapentin (NEURONTIN) 300 MG capsule Take 1 capsule (300 mg total) by mouth 2 (two) times daily.  90 capsule  3  . DISCONTD: insulin NPH-insulin regular (NOVOLIN 70/30) (70-30) 100 UNIT/ML injection 35 units SQ in morning before breakfast 30 units SQ in evening before supper  30 mL  3   Family History  Problem Relation Age of Onset  . Depression Daughter 44  .  Osteoarthritis Maternal Grandmother   . Lung cancer Mother   . Diabetes Father   . Breast cancer Sister   . Pulmonary embolism Brother    History   Social History  . Marital Status: Divorced    Spouse Name: N/A    Number of Children: N/A  . Years of Education: N/A   Social History Main Topics  . Smoking status: Never Smoker   . Smokeless tobacco: None  . Alcohol Use: No  . Drug Use: No  . Sexually Active: None   Other Topics Concern  . None   Social History Narrative   Works at [employer] par time. Single, lives by self.   Review of Systems: Negative except for as noted in the HPI.   Objective:  Physical Exam: Filed Vitals:   06/09/11 1548  BP: 119/72  Pulse: 75  Temp: 98 F (36.7 C)  TempSrc: Oral  Height: 5\' 1"  (1.549 m)  Weight: 222 lb 1.6 oz (100.744 kg)  SpO2: 98%   Constitutional: Vital signs reviewed.  Patient is an obese, well-developed, and well-nourished woman in no acute distress and cooperative with exam. Alert and oriented x3.  Head: Normocephalic and atraumatic Ear: TM normal bilaterally Mouth: no erythema or exudates, MMM Eyes: PERRL, EOMI, conjunctivae normal, No scleral icterus.  Neck: Supple, Trachea midline normal ROM, No JVD, mass, thyromegaly, or carotid bruit present.  Cardiovascular: RRR, S1 normal, S2 normal, no MRG, pulses symmetric and intact bilaterally Pulmonary/Chest: CTAB, no wheezes, rales, or rhonchi Abdominal: Soft. Non-tender, non-distended, bowel sounds are normal, no masses, organomegaly, or guarding present.  GU: no CVA tenderness Musculoskeletal: There is 1+ pitting edema to the knees bilaterally.  There is no joint deformities, erythema, or stiffness, ROM full and pain free in the hips, knees, or ankles.  There is mild burning to light touch over the top and bottom of the feet bilaterally.  ROM in the elbow, wrist, and fingers is normal.  Median nerve compression test is positive in the left hand only.  Tinel's and  Phalens test are negative bilaterally. There is a mild decreased sensation to light touch over the thumb and thenar eminence on the right.   Hematology: no cervical, inginal, or axillary adenopathy.  Neurological: A&O x3, Strength is normal and symmetric bilaterally, cranial nerve II-XII are grossly intact, no focal motor deficit,  Skin: Warm, dry and intact. No rash, cyanosis, or clubbing.  Psychiatric: Normal mood and affect. speech and behavior is normal. Judgment and thought content normal. Cognition and memory are normal.   Assessment & Plan:

## 2011-06-09 NOTE — Patient Instructions (Signed)
Increase the Lasix to 60 mg (1 1/2 tablets daily) for the fluid  Use the compression stockings as soon as you wake up before you sit up to keep the swelling out of your legs.  Increase the Gabapentin.  Start by taking 300 mg tablets 2 tablets in the morning 1 at noon, and 1 at bedtime for 3 days then 2 tabs in the morning, 2 at noon, and then 1 at bedtime for 3 days then 2 tablets 3 times daily.  Use the wrist splints to help protect your wrists from going numb.    Keep trying to stay as active as possible.  Follow up in 2 weeks to recheck your blood work after the increased dose of Lasix.

## 2011-06-11 ENCOUNTER — Other Ambulatory Visit: Payer: Self-pay | Admitting: Internal Medicine

## 2011-06-11 DIAGNOSIS — R609 Edema, unspecified: Secondary | ICD-10-CM

## 2011-06-11 DIAGNOSIS — E1140 Type 2 diabetes mellitus with diabetic neuropathy, unspecified: Secondary | ICD-10-CM | POA: Insufficient documentation

## 2011-06-11 DIAGNOSIS — I10 Essential (primary) hypertension: Secondary | ICD-10-CM

## 2011-06-11 MED ORDER — POTASSIUM CHLORIDE CRYS ER 20 MEQ PO TBCR: 40.0000 meq | EXTENDED_RELEASE_TABLET | Freq: Every day | ORAL | Status: DC

## 2011-06-11 NOTE — Assessment & Plan Note (Signed)
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  We discussed possible treatments including braces, injections, and surgery.  We will start with the wrist braces and see how she does.  If she continues to progress she will likely need referral for EMG testing and evaluation by a hand specialist.

## 2011-06-11 NOTE — Assessment & Plan Note (Signed)
Her symptoms are consistent with chronic venous insufficiency and neuropathy associated with that and her diabetes.  We will increase her Lasix to 60 mg daily and continue to use her TED hose to try to manage this symptomatically.  We will have her follow up to recheck her labs following the increase of the lasix.

## 2011-06-11 NOTE — Assessment & Plan Note (Signed)
Lab Results  Component Value Date   HGBA1C 7.4 04/28/2011   HGBA1C 7.3 01/27/2011   HGBA1C  Value: 7.2 (NOTE)                                                                       According to the ADA Clinical Practice Recommendations for 2011, when HbA1c is used as a screening test:   >=6.5%   Diagnostic of Diabetes Mellitus           (if abnormal result  is confirmed)  5.7-6.4%   Increased risk of developing Diabetes Mellitus  References:Diagnosis and Classification of Diabetes Mellitus,Diabetes Care,2011,34(Suppl 1):S62-S69 and Standards of Medical Care in         Diabetes - 2011,Diabetes Care,2011,34  (Suppl 1):S11-S61.* 10/29/2010   Lab Results  Component Value Date   MICROALBUR 2.89* 04/28/2011   LDLCALC  Value: 135        Total Cholesterol/HDL:CHD Risk Coronary Heart Disease Risk Table                     Men   Women  1/2 Average Risk   3.4   3.3  Average Risk       5.0   4.4  2 X Average Risk   9.6   7.1  3 X Average Risk  23.4   11.0        Use the calculated Patient Ratio above and the CHD Risk Table to determine the patient's CHD Risk.        ATP III CLASSIFICATION (LDL):  <100     mg/dL   Optimal  161-096  mg/dL   Near or Above                    Optimal  130-159  mg/dL   Borderline  045-409  mg/dL   High  >811     mg/dL   Very High* 06/17/7828   CREATININE 0.95 03/17/2011   Her last A1C is well controlled but she is above her goal for cholesterol.  I will not make adjustments to her insulin at this time and defer her management of her hyperlipidemia to Dr. Rogelia Boga.

## 2011-06-11 NOTE — Assessment & Plan Note (Signed)
BP Readings from Last 3 Encounters:  06/09/11 119/72  04/28/11 157/64  03/17/11 107/67   Blood pressure is good today.  She is on an extensive drug regimen and is still difficult to control.  She is having some side effects to the beta blocker including fatigue and decreased exercise tolerance as well as some ankle swelling that could be going with the CCB.  She states she has tried self titrating her norvasc and her beta blocker but notices her blood pressure rises when she does.  I encouraged her to continue her medications as prescribed and we will work to deal with the side effects as best we can.

## 2011-06-11 NOTE — Assessment & Plan Note (Addendum)
Her symptoms are consistent with a peripheral neuropathy that is likely secondary to her diabetes as well as her chronic venous insufficiency.  She is already managed on gabapentin and we will work on increasing her dose to 600 mg three times daily over the next week.

## 2011-06-16 NOTE — Progress Notes (Signed)
Called to [employer], called pt twice, left message

## 2011-06-25 ENCOUNTER — Ambulatory Visit (INDEPENDENT_AMBULATORY_CARE_PROVIDER_SITE_OTHER): Payer: Self-pay | Admitting: Internal Medicine

## 2011-06-25 ENCOUNTER — Encounter: Payer: Self-pay | Admitting: Internal Medicine

## 2011-06-25 DIAGNOSIS — G4700 Insomnia, unspecified: Secondary | ICD-10-CM

## 2011-06-25 DIAGNOSIS — I10 Essential (primary) hypertension: Secondary | ICD-10-CM

## 2011-06-25 DIAGNOSIS — R300 Dysuria: Secondary | ICD-10-CM

## 2011-06-25 DIAGNOSIS — I872 Venous insufficiency (chronic) (peripheral): Secondary | ICD-10-CM

## 2011-06-25 DIAGNOSIS — E119 Type 2 diabetes mellitus without complications: Secondary | ICD-10-CM

## 2011-06-25 LAB — URINALYSIS, ROUTINE W REFLEX MICROSCOPIC
Bilirubin Urine: NEGATIVE
Glucose, UA: NEGATIVE mg/dL
Hgb urine dipstick: NEGATIVE
Leukocytes, UA: NEGATIVE
Specific Gravity, Urine: 1.012 (ref 1.005–1.030)
pH: 6 (ref 5.0–8.0)

## 2011-06-25 MED ORDER — ALPRAZOLAM 0.5 MG PO TABS: 0.5000 mg | ORAL_TABLET | Freq: Every evening | ORAL | Status: DC | PRN

## 2011-06-25 MED ORDER — SPIRONOLACTONE 25 MG PO TABS: 25.0000 mg | ORAL_TABLET | Freq: Every day | ORAL | Status: DC

## 2011-06-25 NOTE — Patient Instructions (Addendum)
Start Spironolactone 25 mg tablets.  Take 1 tablet daily for the swelling.  Start Xanax 0.5 mg at bedtime as needed to help you sleep.  Increase your water intake and I will call you if anything grows from the urine culture.    Follow up in 2 weeks for a check of your labs with the start of the new fluid pill.

## 2011-06-25 NOTE — Progress Notes (Signed)
Subjective:   Patient ID: [name] female   DOB: September 23, 1951 60 y.o.   MRN: [id number]  HPI: [name] is a 60 y.o. woman who presents to clinic today for follow up from her last appointment.  She states that she got leg cramps with the increased lasix so she went back to her previous dose of 40 mg daily.  Her weight has been up some which she attributes to fluid weight.  The swelling is mildly better because she has been wearing her TED hose daily.  She denies SOB, orthopnea, DOE, or abdominal distension.    She states that she has been having some dysuria that starts when she begins urination and then as she finishes.  She feels like she can't empty her bladder completely and only is able to go a little bit at a time.  She denies any fevers, chills, abdominal pain, back pain, or urgency in her urination.    She also states that she is still having problems sleeping.  She states that she has a hard time falling asleep and that it sometimes takes several hours for her to sleep.  She states she has used a medication to help her in the past and that she would like to try it again.  She denies any hypersexuality, overbuying, being awake for several days without being tired as well as panic attacks.    Past Medical History  Diagnosis Date  . Chest pain 11/07    Dr Jens Som cath : LAD luminal irregularities, ramus intermediate with superior branch 70% stenosis, circumflex 50% stenosis, marginal 50% stenosis, well preserved ejection fraction.  . Diabetes mellitus type II     Insulin dependent. Has worked with Lupita Leash R previously.  Marland Kitchen Hypertension     Requires 5 drug therapy and still difficult to control.  Marland Kitchen GERD (gastroesophageal reflux disease)   . Hyperlipidemia     On daily statin  . Obesity     Morbid. HAs worked with Lupita Leash T previously.  . Colonic polyp 1995    Colonoscopies 1994, 2000, and 02/02/2011. Last had 9 polyps - path pending  . OSA (obstructive sleep apnea) 02/16/2007    Moderate AHI 35.6, O2 sat decreased to 65%, CPAP titration to 16 with AHI of 3.6.  Marland Kitchen Chronic pain     MR 08/07/10 :  Spondylosis L4-5 with B foraminal narrowing and L4 nerve root enchroachment B.  . Seasonal allergies     seasonal  . 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 S/p masctemoy and chemo.  . Iron deficiency anemia     Ferritin was 12 in 2012. on FeSo4. Has had colon and EGD 02/02/11 that showed mild gastritis and colon polyps.  . Chronic venous insufficiency 2012    Has had full W/U incl ECHO, LFT's, Cr, Umicro, TSH, BNP. Symptomatic treatment.   Current Outpatient Prescriptions  Medication Sig Dispense Refill  . amLODipine (NORVASC) 10 MG tablet Take 1 tablet (10 mg total) by mouth daily.  90 tablet  3  . aspirin 81 MG EC tablet Take 81 mg by mouth daily.        Marland Kitchen atorvastatin (LIPITOR) 40 MG tablet Take 40 mg by mouth daily.        Tyrone Schimke, Vaccinium myrtillus, (BILBERRY PO) Take 1,000 mg by mouth daily.        . Blood Glucose Monitoring Suppl (WAVESENSE PRESTO) W/DEVICE KIT 1 each by Does not  apply route 4 (four) times daily -  before meals and at bedtime.  100 each  11  . chlorpheniramine-phenylephrine-dextromethorphan (RONDEC DM) 12.02-04-14 MG/5ML SYRP Take by mouth. Take 1-2 teaspoons every 6-8 hours as needed for cough       . Cholecalciferol (VITAMIN D3) 400 UNITS CAPS Take 400 Int'l Units by mouth at bedtime.        . cloNIDine (CATAPRES) 0.2 MG tablet Take 1 tablet (0.2 mg total) by mouth 2 (two) times daily.  60 tablet  11  . COD LIVER OIL PO Take 1 capsule by mouth daily.        . cyclobenzaprine (FLEXERIL) 5 MG tablet Take 1 tablet (5 mg total) by mouth every 8 (eight) hours as needed for muscle spasms.  60 tablet  1  . furosemide (LASIX) 40 MG tablet Take 1.5 tablets (60 mg total) by mouth daily.  45 tablet  2  . gabapentin (NEURONTIN) 300 MG capsule Take 2 tablets in the morning then 1 at noon and 1 at bed for 3 days then  2 tabs in the morning and 2 at noon and 1 at bed for 3 days then 2 tablets three times daily.  180 capsule  3  . glucose blood test strip wavesense presto- use as instructed to check blood sugar 3x daily before meals & bedtime  100 each  11  . ibuprofen (ADVIL,MOTRIN) 800 MG tablet Take 800 mg by mouth every 6 (six) hours as needed. pain       . insulin NPH-insulin regular (NOVOLIN 70/30) (70-30) 100 UNIT/ML injection 35 units SQ in morning before breakfast 25 units SQ in evening before supper  10 mL  11  . Insulin Syringe-Needle U-100 (INSULIN SYRINGE .5CC/28G) 28G X 1/2" 0.5 ML MISC as directed.        . irbesartan (AVAPRO) 150 MG tablet Take 150 mg by mouth daily.        . Lancets 30G MISC Use to check blood sugar twice a day       . loratadine (CLARITIN) 10 MG tablet Take 10 mg by mouth 2 (two) times daily as needed. For allergies       . metFORMIN (GLUCOPHAGE) 1000 MG tablet Take 1 tablet (1,000 mg total) by mouth 2 (two) times daily with meals.  60 tablet  11  . metoprolol (TOPROL-XL) 100 MG 24 hr tablet Take 1 tablet (100 mg total) by mouth daily.  30 tablet  11  . nitroGLYCERIN (NITROSTAT) 0.4 MG SL tablet Place 1 tablet (0.4 mg total) under the tongue every 5 (five) minutes as needed for chest pain.  30 tablet  2  . polyethylene glycol (MIRALAX / GLYCOLAX) packet Take 34 g by mouth daily.        . potassium chloride SA (K-DUR,KLOR-CON) 20 MEQ tablet Take 2 tablets (40 mEq total) by mouth daily.  60 tablet  3  . vitamin B-12 (CYANOCOBALAMIN) 500 MCG tablet Take 500 mcg by mouth daily.        Marland Kitchen DISCONTD: gabapentin (NEURONTIN) 300 MG capsule Take 1 capsule (300 mg total) by mouth 3 (three) times daily.  90 capsule  11  . DISCONTD: gabapentin (NEURONTIN) 300 MG capsule Take 1 capsule (300 mg total) by mouth 2 (two) times daily.  90 capsule  3  . DISCONTD: insulin NPH-insulin regular (NOVOLIN 70/30) (70-30) 100 UNIT/ML injection 35 units SQ in morning before breakfast 30 units SQ in evening  before supper  30 mL  3  Family History  Problem Relation Age of Onset  . Depression Daughter 61  . Osteoarthritis Maternal Grandmother   . Lung cancer Mother   . Diabetes Father   . Breast cancer Sister   . Pulmonary embolism Brother    History   Social History  . Marital Status: Divorced    Spouse Name: N/A    Number of Children: N/A  . Years of Education: N/A   Social History Main Topics  . Smoking status: Never Smoker   . Smokeless tobacco: None  . Alcohol Use: No  . Drug Use: No  . Sexually Active: None   Other Topics Concern  . None   Social History Narrative   Works at [employer] par time. Single, lives by self.   Review of Systems: Constitutional: Denies fever, chills, diaphoresis, appetite change and fatigue.  HEENT: Denies photophobia, eye pain, redness, hearing loss, ear pain, congestion, sore throat, rhinorrhea, sneezing, mouth sores, trouble swallowing, neck pain, neck stiffness and tinnitus.   Respiratory: Denies SOB, DOE, cough, chest tightness,  and wheezing.   Cardiovascular: Denies chest pain, palpitations and leg swelling.  Gastrointestinal: Denies nausea, vomiting, abdominal pain, diarrhea, constipation, blood in stool and abdominal distention.  Genitourinary: Positive for frequency, dysuria Denies urgency, hematuria, flank pain and difficulty urinating.  Musculoskeletal: Denies myalgias, back pain, joint swelling, arthralgias and gait problem.  Skin: Denies pallor, rash and wound.  Neurological: Denies dizziness, seizures, syncope, weakness, light-headedness, numbness and headaches.  Hematological: Denies adenopathy. Easy bruising, personal or family bleeding history  Psychiatric/Behavioral: Positive for nervousness sleep disturbance Denies suicidal ideation, mood changes, confusion, and agitation  Objective:  Physical Exam: Filed Vitals:   06/25/11 0945  BP: 158/106  Pulse: 62  Temp: 97 F (36.1 C)  TempSrc: Oral  Height: 5\' 1"   (1.549 m)  Weight: 224 lb (101.606 kg)   Constitutional: Vital signs reviewed.  Patient is an anxious, well-developed, and well-nourished woman in no acute distress and cooperative with exam. Alert and oriented x3.  Head: Normocephalic and atraumatic Ear: TM normal bilaterally Mouth: no erythema or exudates, MMM Eyes: PERRL, EOMI, conjunctivae normal, No scleral icterus.  Neck: Supple, Trachea midline normal ROM, No JVD, mass, thyromegaly, or carotid bruit present.  Cardiovascular: RRR, S1 normal, S2 normal, no MRG, pulses symmetric and intact bilaterally Pulmonary/Chest: CTAB, no wheezes, rales, or rhonchi Abdominal: Soft. Non-tender, non-distended, bowel sounds are normal, no masses, organomegaly, or guarding present.  GU: no CVA tenderness Musculoskeletal: 1+ edema to the bilateral lower legs with TED hose in place.  No joint deformities, erythema, or stiffness, ROM full and no nontender Hematology: no cervical, inginal, or axillary adenopathy.  Neurological: A&O x3, Strength is normal and symmetric bilaterally, cranial nerve II-XII are grossly intact, no focal motor deficit, sensory intact to light touch bilaterally.  Skin: Warm, dry and intact. No rash, cyanosis, or clubbing.  Psychiatric: Normal mood and affect. speech and behavior is normal. Judgment and thought content normal. Cognition and memory are normal.   Assessment & Plan:

## 2011-06-27 LAB — URINE CULTURE
Colony Count: NO GROWTH
Organism ID, Bacteria: NO GROWTH

## 2011-06-29 NOTE — Assessment & Plan Note (Addendum)
Lab Results  Component Value Date   HGBA1C 7.4 04/28/2011   HGBA1C 7.3 01/27/2011   HGBA1C  Value: 7.2  10/29/2010   Lab Results  Component Value Date   MICROALBUR 2.89* 04/28/2011   LDLCALC  Value: 135       10/29/2010   CREATININE 0.95 03/17/2011   Her A1c is well controlled at this point.  We will continue her medications at this point.  She is due for a follow up FLP to recheck her LDL after starting lipitor.

## 2011-06-29 NOTE — Assessment & Plan Note (Signed)
She was unable to tolerate the increased Lasix so we will add Spironolactone 25 mg daily and recheck her blood pressure and potassium in 2 weeks to see how she is doing.

## 2011-06-29 NOTE — Assessment & Plan Note (Signed)
Office Visit on 06/25/2011  Component Date Value Range Status  . Glucose-Capillary (mg/dL) 16/07/9603 540* 98-11 Final  . Color, Urine  06/25/2011 YELLOW  YELLOW Final  . Appearance  06/25/2011 CLEAR  CLEAR Final  . Specific Gravity, Urine  06/25/2011 1.012  1.005-1.030 Final  . pH  06/25/2011 6.0  5.0-8.0 Final  . Glucose, UA (mg/dL) 91/47/8295 NEG  NEG Final  . Bilirubin Urine  06/25/2011 NEG  NEG Final  . Ketones, ur (mg/dL) 62/13/0865 NEG  NEG Final  . Hgb urine dipstick  06/25/2011 NEG  NEG Final  . Protein, ur (mg/dL) 78/46/9629 NEG  NEG Final  . Urobilinogen, UA (mg/dL) 52/84/1324 0.2  4.0-1.0 Final  . Nitrite  06/25/2011 NEG  NEG Final  . Leukocytes, UA  06/25/2011 NEG  NEG Final  . Colony Count  06/25/2011 NO GROWTH   Final  . Organism ID, Bacteria  06/25/2011 NO GROWTH   Final   Her urinalysis is negative and clear and there was no growth in the culture so we will not treat her dysuria.  We will follow up and see if she continues to have the symptoms.  She is on a restricted fluid diet due to her CHF so lack of oral fluids and urine concentration may be the cause of her dysuria.

## 2011-06-29 NOTE — Assessment & Plan Note (Signed)
Lab Results  Component Value Date   NA 138 03/17/2011   K 3.4* 03/17/2011   CL 100 03/17/2011   CO2 28 03/17/2011   BUN 21 03/17/2011   CREATININE 0.95 03/17/2011   CREATININE 0.89 12/31/2010    BP Readings from Last 3 Encounters:  06/25/11 158/106  06/09/11 119/72  04/28/11 157/64    Assessment: Hypertension control:  moderately elevated  Progress toward goals:  deteriorated Barriers to meeting goals:  nonadherence to medications, adverse effects of medications and lack of understanding of disease management  Plan: Hypertension treatment:  continue current medications with the addition of spironolactone and increase diuresis we will hold off adjusting her medications further at this time.  if she continues to be elevated we will increase her medication at her follow up.

## 2011-06-29 NOTE — Assessment & Plan Note (Signed)
Her anxiety is likely the cause of her sleep disturbance.  I reviewed her previous records and the only medication she has been on for sleep would have been Xanax.  We will start 0.5 mg at bedtime to help her sleep and see her back.

## 2011-07-01 LAB — CBC
HCT: 33.6 — ABNORMAL LOW
Platelets: 259
WBC: 7.5

## 2011-07-01 LAB — POCT I-STAT, CHEM 8
BUN: 18
Creatinine, Ser: 1
Glucose, Bld: 286 — ABNORMAL HIGH
Hemoglobin: 12.2
Potassium: 3.7
TCO2: 25

## 2011-07-01 LAB — HEMOGLOBIN A1C: Mean Plasma Glucose: 229

## 2011-07-01 LAB — DIFFERENTIAL
Eosinophils Absolute: 0.2
Eosinophils Relative: 2
Lymphocytes Relative: 44
Lymphs Abs: 3.3
Monocytes Relative: 6

## 2011-07-01 LAB — CARDIAC PANEL(CRET KIN+CKTOT+MB+TROPI): CK, MB: 1.4

## 2011-07-01 LAB — POCT CARDIAC MARKERS
CKMB, poc: 1.8
Myoglobin, poc: 54.7

## 2011-07-01 LAB — LIPID PANEL
Cholesterol: 172
HDL: 48
LDL Cholesterol: 105 — ABNORMAL HIGH
Total CHOL/HDL Ratio: 3.6
VLDL: 19

## 2011-07-01 LAB — CK TOTAL AND CKMB (NOT AT ARMC)
CK, MB: 1.5
Relative Index: 1
Total CK: 149

## 2011-07-01 LAB — B-NATRIURETIC PEPTIDE (CONVERTED LAB): Pro B Natriuretic peptide (BNP): 63

## 2011-07-12 LAB — BASIC METABOLIC PANEL
BUN: 12
Chloride: 101
Creatinine, Ser: 0.96
GFR calc non Af Amer: >60
Glucose, Bld: 250 — ABNORMAL HIGH
Potassium: 3.6

## 2011-07-12 LAB — CBC
Hemoglobin: 11.6 — ABNORMAL LOW
MCHC: 32.7
MCV: 84.7
Platelets: 241
RBC: 3.98
WBC: 6

## 2011-07-12 LAB — COMPREHENSIVE METABOLIC PANEL
ALT: 22
AST: 18
Albumin: 3.4 — ABNORMAL LOW
CO2: 28
Chloride: 104
Creatinine, Ser: 1.07
GFR calc Af Amer: >60
GFR calc non Af Amer: 53 — ABNORMAL LOW
Potassium: 3.4 — ABNORMAL LOW
Sodium: 136
Total Bilirubin: 0.7

## 2011-07-12 LAB — CARDIAC PANEL(CRET KIN+CKTOT+MB+TROPI)
CK, MB: 1.2
CK, MB: 1.4
CK, MB: 1.4
Relative Index: 0.8
Relative Index: 0.9
Relative Index: 1
Relative Index: 1
Total CK: 143
Total CK: 151
Troponin I: 0.01
Troponin I: 0.01
Troponin I: 0.02

## 2011-07-12 LAB — B-NATRIURETIC PEPTIDE (CONVERTED LAB): Pro B Natriuretic peptide (BNP): 39

## 2011-07-13 ENCOUNTER — Ambulatory Visit (INDEPENDENT_AMBULATORY_CARE_PROVIDER_SITE_OTHER): Payer: Self-pay | Admitting: Internal Medicine

## 2011-07-13 ENCOUNTER — Encounter: Payer: Self-pay | Admitting: Internal Medicine

## 2011-07-13 DIAGNOSIS — D509 Iron deficiency anemia, unspecified: Secondary | ICD-10-CM

## 2011-07-13 DIAGNOSIS — I2510 Atherosclerotic heart disease of native coronary artery without angina pectoris: Secondary | ICD-10-CM

## 2011-07-13 DIAGNOSIS — R350 Frequency of micturition: Secondary | ICD-10-CM

## 2011-07-13 DIAGNOSIS — K635 Polyp of colon: Secondary | ICD-10-CM

## 2011-07-13 DIAGNOSIS — I10 Essential (primary) hypertension: Secondary | ICD-10-CM

## 2011-07-13 DIAGNOSIS — C50919 Malignant neoplasm of unspecified site of unspecified female breast: Secondary | ICD-10-CM

## 2011-07-13 DIAGNOSIS — Z0000 Encounter for general adult medical examination without abnormal findings: Secondary | ICD-10-CM

## 2011-07-13 DIAGNOSIS — D126 Benign neoplasm of colon, unspecified: Secondary | ICD-10-CM

## 2011-07-13 DIAGNOSIS — E119 Type 2 diabetes mellitus without complications: Secondary | ICD-10-CM

## 2011-07-13 DIAGNOSIS — E785 Hyperlipidemia, unspecified: Secondary | ICD-10-CM

## 2011-07-13 LAB — GLUCOSE, CAPILLARY
Glucose-Capillary: 220 mg/dL — ABNORMAL HIGH (ref 70–99)
Glucose-Capillary: 178 mg/dL — ABNORMAL HIGH (ref 70–99)

## 2011-07-13 LAB — FERRITIN: Ferritin: 29 ng/mL (ref 10–291)

## 2011-07-13 LAB — POCT GLYCOSYLATED HEMOGLOBIN (HGB A1C): Hemoglobin A1C: 7.4

## 2011-07-13 LAB — LIPID PANEL: Cholesterol: 224 mg/dL — ABNORMAL HIGH (ref 0–200)

## 2011-07-13 NOTE — Patient Instructions (Addendum)
I will mail you your test results The cold virus will run its course We will schedule you a mammogram

## 2011-07-14 ENCOUNTER — Encounter: Payer: Self-pay | Admitting: Internal Medicine

## 2011-07-14 DIAGNOSIS — R350 Frequency of micturition: Secondary | ICD-10-CM | POA: Insufficient documentation

## 2011-07-14 LAB — BASIC METABOLIC PANEL WITH GFR
Calcium: 9.4 mg/dL (ref 8.4–10.5)
Chloride: 105 mEq/L (ref 96–112)
Creat: 1.02 mg/dL (ref 0.50–1.10)
GFR, Est African American: >60 mL/min (ref >60)
GFR, Est Non African American: 55 mL/min — ABNORMAL LOW (ref >60)
Sodium: 138 mEq/L (ref 135–145)

## 2011-07-14 MED ORDER — ATORVASTATIN CALCIUM 80 MG PO TABS: 80.0000 mg | ORAL_TABLET | Freq: Every day | ORAL | Status: DC

## 2011-07-14 NOTE — Progress Notes (Signed)
  Subjective:    Patient ID: [name], female    DOB: [birth year], 60 y.o.   MRN: [id number]  HPI  Please see the A&P for the status of the pt's chronic medical problems.   Review of Systems  Constitutional: Positive for chills. Negative for fever.       Subjective feeling of hot and cold  HENT: Positive for congestion, sore throat, rhinorrhea, sneezing and postnasal drip.        Head pressure  Respiratory: Positive for cough.   Cardiovascular:       Chest pressure / pleuritic pain  Gastrointestinal: Negative for nausea, vomiting and diarrhea.  Musculoskeletal: Positive for myalgias.  Skin: Negative for wound.  Neurological: Positive for headaches.  Psychiatric/Behavioral: Positive for sleep disturbance.       Objective:   Physical Exam  Constitutional: She is oriented to person, place, and time. She appears well-developed and well-nourished. No distress.  HENT:  Head: Normocephalic and atraumatic.  Right Ear: External ear normal.  Left Ear: External ear normal.  Nose: Nose normal.  Eyes: Conjunctivae and EOM are normal. Pupils are equal, round, and reactive to light. Right eye exhibits no discharge. Left eye exhibits no discharge.  Neck: Neck supple. No tracheal deviation present. No thyromegaly present.  Cardiovascular: Normal rate and regular rhythm.   Murmur heard. Pulmonary/Chest: Effort normal and breath sounds normal. No respiratory distress. She has no wheezes. She has no rales. She exhibits no tenderness.  Musculoskeletal: Normal range of motion. She exhibits no edema.  Lymphadenopathy:    She has no cervical adenopathy.  Neurological: She is alert and oriented to person, place, and time.  Skin: Skin is warm and dry. She is not diaphoretic.  Psychiatric: She has a normal mood and affect. Her behavior is normal. Judgment and thought content normal.          Assessment & Plan:

## 2011-07-14 NOTE — Assessment & Plan Note (Signed)
BP Readings from Last 3 Encounters:  07/13/11 132/77  06/25/11 158/106  06/09/11 119/72   Her BP is well controlled today. Continue 5 drug therapy - norvasc, clonidine, lasix, avapro, toprol. Will check BMP today as last BMP was 4 months ago and she is interested in what her K level is.

## 2011-07-14 NOTE — Assessment & Plan Note (Addendum)
She has had the following symptoms for "quite some time". I tried to get her to specify the time frame better but she is unable to. She cannot empty her bladder fully, she has urinary frequency, nocturia, and dribbling. She has no dysuria.  She states that 30 years ago she went on a diet that was high with water intake and ended up "shrinking" her bladder but was able to loose 100 lbs. A urologist had to go in and stretch her bladder and her symptoms currently feels the same as she felt 30 years ago. She does have some urinary urgency but it does not seem like that is her primary issue. I am referring her to urology and hopefully even though she only has the [facility] card they will be able to reevaluate her and assist in her symptoms.

## 2011-07-14 NOTE — Assessment & Plan Note (Addendum)
She checks her sugar three to four times daily and does not miss any insulin doses. She is alos on metformin is her Creatinine is well below the cut off level. Her A1C is well controlled.   Lab Results  Component Value Date   HGBA1C 7.4 07/13/2011   She is UTD on all DM measures except influenza which she refuses.

## 2011-07-14 NOTE — Assessment & Plan Note (Signed)
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

## 2011-07-14 NOTE — Assessment & Plan Note (Signed)
She stated that when she had her colonoscopy, the MD commented that the polyps were bleeding. Her ferritin was 13 and she is taking iron daily. I repeated her ferritin and it is better at 29. She will need to cont to take her iron.  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

## 2011-07-14 NOTE — Assessment & Plan Note (Addendum)
Her last cholesterol panel was 8 months ago and was not controlled. Her LDL at this visit is 147 although she was not fasting when this was obtained. I am going to send off a direct LDL to see if that changes her value. Somehow I think it still going to be elevated as her LDL was not controlled 8 months ago. If it remains elevated I will need to increase her Lipitor to 80 mg. I need about a 50% reduction in her LDL and if the elevated Lipitor doses not achieve this I will need to change to Crestor. She has no insurance and gets her medications from the [facility].

## 2011-07-14 NOTE — Assessment & Plan Note (Signed)
We reviewed her MMG and her last one in Nov showed some liekly benign calcifications and rec 6 month F/U but pt was never contacted to return. Will order today.

## 2011-07-14 NOTE — Assessment & Plan Note (Signed)
She is asymptomatic. She is on an aspirin and a beta blocker and a statin. She no longer sees cardiology.

## 2011-07-15 LAB — LDL CHOLESTEROL, DIRECT: Direct LDL: 153 mg/dL — ABNORMAL HIGH

## 2011-07-23 ENCOUNTER — Other Ambulatory Visit: Payer: Self-pay | Admitting: Internal Medicine

## 2011-07-23 ENCOUNTER — Ambulatory Visit
Admission: RE | Admit: 2011-07-23 | Discharge: 2011-07-23 | Disposition: A | Discharge status: Home / Self Care | Payer: Self-pay | Source: Ambulatory Visit | Attending: Internal Medicine | Admitting: Internal Medicine

## 2011-07-23 DIAGNOSIS — C50919 Malignant neoplasm of unspecified site of unspecified female breast: Secondary | ICD-10-CM

## 2011-08-18 ENCOUNTER — Telehealth: Payer: Self-pay | Admitting: *Deleted

## 2011-08-18 ENCOUNTER — Encounter: Payer: Self-pay | Admitting: Internal Medicine

## 2011-08-18 NOTE — Telephone Encounter (Signed)
Have letter. Will bring down tomorrow

## 2011-08-18 NOTE — Telephone Encounter (Signed)
Call from pt's Medicaid worker; stated pt had a hearing this morning (pt applying for Medicaid and Disability). Pt told her Dr. Rogelia Boga had written a letter towards her getting Medicaid. I talked w/Dr.Butcher; she does not know anything about request for a letter but will write one. Mrs. Julien Girt stated if Dr. Rogelia Boga can write one about pt's condition/limitations to Medicaid. Fax # [phone](directly to Mrs. Garner Nash)  Thanks

## 2011-08-19 NOTE — Telephone Encounter (Signed)
Letter faxed to [facility] worker, Mrs. Garner Nash [phone].

## 2011-09-07 ENCOUNTER — Other Ambulatory Visit: Payer: Self-pay | Admitting: *Deleted

## 2011-09-07 DIAGNOSIS — I10 Essential (primary) hypertension: Secondary | ICD-10-CM

## 2011-09-07 MED ORDER — IRBESARTAN 150 MG PO TABS: 150.0000 mg | ORAL_TABLET | Freq: Every day | ORAL | Status: DC

## 2011-09-07 NOTE — Telephone Encounter (Signed)
Rx called in 

## 2011-09-10 ENCOUNTER — Other Ambulatory Visit: Payer: Self-pay | Admitting: Internal Medicine

## 2011-09-10 ENCOUNTER — Encounter: Payer: Self-pay | Admitting: Internal Medicine

## 2011-09-10 ENCOUNTER — Ambulatory Visit (INDEPENDENT_AMBULATORY_CARE_PROVIDER_SITE_OTHER): Payer: Self-pay | Admitting: Internal Medicine

## 2011-09-10 VITALS — BP 132/69 | HR 85 | Temp 98.1°F | Ht 61.0 in | Wt 225.0 lb

## 2011-09-10 DIAGNOSIS — G8929 Other chronic pain: Secondary | ICD-10-CM

## 2011-09-10 DIAGNOSIS — I10 Essential (primary) hypertension: Secondary | ICD-10-CM

## 2011-09-10 LAB — GLUCOSE, CAPILLARY: Glucose-Capillary: 120 mg/dL — ABNORMAL HIGH (ref 70–99)

## 2011-09-10 MED ORDER — ZOLPIDEM TARTRATE 5 MG PO TABS: 5.0000 mg | ORAL_TABLET | Freq: Every evening | ORAL | Status: DC | PRN

## 2011-09-10 MED ORDER — LIDOCAINE 5 % EX PTCH: 1.0000 | MEDICATED_PATCH | CUTANEOUS | Status: DC

## 2011-09-10 NOTE — Patient Instructions (Signed)
Make followup appointment with Dr. Rogelia Boga as soon as possible. Apply lidocaine patch on her back and on her arm and keep it for 12 hours as needed for pain. Also take Ambien 5 mg one tablet at night before bedtime to help you sleep. Take all your medications regularly. If you don't feel better give [place] a call and make an early appointment.

## 2011-09-14 ENCOUNTER — Telehealth: Payer: Self-pay | Admitting: *Deleted

## 2011-09-14 NOTE — Assessment & Plan Note (Signed)
Lab Results  Component Value Date   NA 138 07/13/2011   K 3.8 07/13/2011   CL 105 07/13/2011   CO2 25 07/13/2011   BUN 15 07/13/2011   CREATININE 1.02 07/13/2011   CREATININE 0.89 12/31/2010    BP Readings from Last 3 Encounters:  09/10/11 132/69  07/13/11 132/77  06/25/11 158/106    Assessment: Hypertension control:  controlled  Progress toward goals:  unchanged Barriers to meeting goals:  no barriers identified  Plan: Hypertension treatment:  continue current medications

## 2011-09-14 NOTE — Assessment & Plan Note (Signed)
Patient complaining of left arm and thigh pain getting worse past 3-4 weeks- as described in history of present illness. The diagnosis doesn't seem to be clear at this point of time. She is already on Neurontin and ibuprofen for pain. So this likely seems as acute worsening of neuropathy pain. I won't prescribe any narcotics- which she asked for, but instead as per the flow chart of neuropathy pain management- I offered her to try lidocaine patches. She was agreeable to the plan- and says that she will make an appointment with Dr. Rogelia Boga for further followup of her pain as needed.

## 2011-09-14 NOTE — Telephone Encounter (Signed)
If she just got a 90 day supply then she will be OK until mid March. I will get a refill request then right and can sub what they have available at that time. I see no reason to substitute now as what I choose now may not be avaialbe in March

## 2011-09-14 NOTE — Telephone Encounter (Signed)
Fax from the [facility] will no longer be available  to the pt.  Pt has a 90 day supply now and an authorization for the final supply remains.  Pt can obtain Benicar, Diovan or Micardis that will be available in the coming weeks.  Angelina Ok, RN 09/14/2011 10:48 AM.

## 2011-09-14 NOTE — Progress Notes (Signed)
  Subjective:    Patient ID: [name], female    DOB: 1951/01/28, 60 y.o.   MRN: [id number]  HPI [name] is a pleasant 60 year old woman with past history of CAD, HTN, hyperlipidemia, DM 2 who comes to the clinic with chief complaint of left arm and left leg pain for past 3-4 weeks. She was seen by Dr. Rogelia Boga on 07/13/2011 but did not have the symptoms then.  She does not remember any trauma to her arms or leg. She says the pain is sharp shooting and dull at times- mostly in left arm- not radiating up in the chest  and left thigh. She does complain of associated low back pain- which is chronic per patient and not changed much. Nothing makes the pain better or worse. It doesn't get worse with activity. The pain is described as intolerable- which gets better with some narcotic pain medication- which she takes from someone else- and not prescribed from our clinic at present.  Otherwise she denies any fever, chills, nausea vomiting, abdominal pain, chest pain, shortness of breath.   Review of Systems    as per history of present illness, all other systems reviewed and negative. Objective:   Physical Exam  Vitals: Reviewed General: NAD HEENT: PERRL, EOMI, no scleral icterus Cardiac: S1, S2, RRR, no rubs, murmurs or gallops Pulm: clear to auscultation bilaterally, moving normal volumes of air Abd: soft, nontender, nondistended, BS present Ext: No tenderness to palpation to left arm or thigh. Full range of motion of elbow and knee. Neuro: alert and oriented X3, cranial nerves II-XII grossly intact, strength and sensation to light touch equal in bilateral upper and lower extremities       Assessment & Plan:

## 2011-10-09 ENCOUNTER — Encounter (HOSPITAL_COMMUNITY): Payer: Self-pay | Admitting: Emergency Medicine

## 2011-10-09 ENCOUNTER — Emergency Department (HOSPITAL_COMMUNITY): Payer: Medicaid Other

## 2011-10-09 ENCOUNTER — Inpatient Hospital Stay (HOSPITAL_COMMUNITY)
Admission: EM | Admit: 2011-10-09 | Discharge: 2011-10-10 | DRG: 313 | Disposition: A | Discharge status: Home / Self Care | Payer: Medicaid Other | Attending: Cardiology | Admitting: Cardiology

## 2011-10-09 ENCOUNTER — Other Ambulatory Visit: Payer: Self-pay

## 2011-10-09 DIAGNOSIS — Z6841 Body Mass Index (BMI) 40.0 and over, adult: Secondary | ICD-10-CM

## 2011-10-09 DIAGNOSIS — Z853 Personal history of malignant neoplasm of breast: Secondary | ICD-10-CM

## 2011-10-09 DIAGNOSIS — I2510 Atherosclerotic heart disease of native coronary artery without angina pectoris: Secondary | ICD-10-CM

## 2011-10-09 DIAGNOSIS — R0789 Other chest pain: Principal | ICD-10-CM | POA: Diagnosis present

## 2011-10-09 DIAGNOSIS — Z860100 Personal history of colon polyps, unspecified: Secondary | ICD-10-CM

## 2011-10-09 DIAGNOSIS — R609 Edema, unspecified: Secondary | ICD-10-CM

## 2011-10-09 DIAGNOSIS — M25519 Pain in unspecified shoulder: Secondary | ICD-10-CM | POA: Diagnosis present

## 2011-10-09 DIAGNOSIS — K219 Gastro-esophageal reflux disease without esophagitis: Secondary | ICD-10-CM | POA: Diagnosis present

## 2011-10-09 DIAGNOSIS — R079 Chest pain, unspecified: Secondary | ICD-10-CM

## 2011-10-09 DIAGNOSIS — G8929 Other chronic pain: Secondary | ICD-10-CM | POA: Diagnosis present

## 2011-10-09 DIAGNOSIS — G4733 Obstructive sleep apnea (adult) (pediatric): Secondary | ICD-10-CM | POA: Diagnosis present

## 2011-10-09 DIAGNOSIS — R55 Syncope and collapse: Secondary | ICD-10-CM | POA: Diagnosis present

## 2011-10-09 DIAGNOSIS — I872 Venous insufficiency (chronic) (peripheral): Secondary | ICD-10-CM | POA: Diagnosis present

## 2011-10-09 DIAGNOSIS — Z794 Long term (current) use of insulin: Secondary | ICD-10-CM

## 2011-10-09 DIAGNOSIS — E119 Type 2 diabetes mellitus without complications: Secondary | ICD-10-CM

## 2011-10-09 DIAGNOSIS — E785 Hyperlipidemia, unspecified: Secondary | ICD-10-CM

## 2011-10-09 DIAGNOSIS — I10 Essential (primary) hypertension: Secondary | ICD-10-CM | POA: Diagnosis present

## 2011-10-09 LAB — DIFFERENTIAL
Lymphocytes Relative: 37 % (ref 12–46)
Lymphs Abs: 2.6 10*3/uL (ref 0.7–4.0)
Monocytes Absolute: 0.5 10*3/uL (ref 0.1–1.0)
Monocytes Relative: 7 % (ref 3–12)
Neutro Abs: 3.7 10*3/uL (ref 1.7–7.7)
Neutrophils Relative %: 53 % (ref 43–77)

## 2011-10-09 LAB — BASIC METABOLIC PANEL
BUN: 14 mg/dL (ref 6–23)
CO2: 25 mEq/L (ref 19–32)
Chloride: 103 mEq/L (ref 96–112)
Creatinine, Ser: 0.79 mg/dL (ref 0.50–1.10)
GFR calc Af Amer: >90 mL/min (ref >90)
Glucose, Bld: 170 mg/dL — ABNORMAL HIGH (ref 70–99)
Potassium: 3.9 mEq/L (ref 3.5–5.1)

## 2011-10-09 LAB — CBC
HCT: 35.2 % — ABNORMAL LOW (ref 36.0–46.0)
Hemoglobin: 11.4 g/dL — ABNORMAL LOW (ref 12.0–15.0)
RBC: 4.06 MIL/uL (ref 3.87–5.11)

## 2011-10-09 LAB — GLUCOSE, CAPILLARY
Glucose-Capillary: 114 mg/dL — ABNORMAL HIGH (ref 70–99)
Glucose-Capillary: 130 mg/dL — ABNORMAL HIGH (ref 70–99)

## 2011-10-09 LAB — POCT I-STAT TROPONIN I: Troponin i, poc: 0 ng/mL (ref 0.00–0.08)

## 2011-10-09 LAB — HEMOGLOBIN A1C: Hgb A1c MFr Bld: 8.6 % — ABNORMAL HIGH (ref <5.7)

## 2011-10-09 LAB — PRO B NATRIURETIC PEPTIDE: Pro B Natriuretic peptide (BNP): 76 pg/mL (ref 0–125)

## 2011-10-09 LAB — PROTIME-INR: Prothrombin Time: 13.2 seconds (ref 11.6–15.2)

## 2011-10-09 MED ORDER — NITROGLYCERIN 2 % TD OINT
1.0000 [in_us] | TOPICAL_OINTMENT | Freq: Once | TRANSDERMAL | Status: AC
  Administered 2011-10-09: 1 [in_us] via TOPICAL
  Filled 2011-10-09: qty 1

## 2011-10-09 MED ORDER — NITROGLYCERIN 2 % TD OINT
1.0000 [in_us] | TOPICAL_OINTMENT | Freq: Four times a day (QID) | TRANSDERMAL | Status: DC
  Administered 2011-10-09 – 2011-10-10 (×3): 1 [in_us] via TOPICAL
  Filled 2011-10-09: qty 30

## 2011-10-09 MED ORDER — HEPARIN SOD (PORCINE) IN D5W 100 UNIT/ML IV SOLN
INTRAVENOUS | Status: AC
  Administered 2011-10-09: 25000 [IU] via INTRAVENOUS
  Filled 2011-10-09: qty 250

## 2011-10-09 MED ORDER — ASPIRIN 81 MG PO CHEW
CHEWABLE_TABLET | ORAL | Status: AC
  Filled 2011-10-09: qty 1

## 2011-10-09 MED ORDER — SODIUM CHLORIDE 0.9 % IJ SOLN: 3.0000 mL | Freq: Two times a day (BID) | INTRAMUSCULAR | Status: DC

## 2011-10-09 MED ORDER — INSULIN ASPART 100 UNIT/ML ~~LOC~~ SOLN
0.0000 [IU] | Freq: Three times a day (TID) | SUBCUTANEOUS | Status: DC
  Administered 2011-10-09: 2 [IU] via SUBCUTANEOUS
  Administered 2011-10-10: 3 [IU] via SUBCUTANEOUS
  Filled 2011-10-09: qty 3
  Filled 2011-10-09: qty 1

## 2011-10-09 MED ORDER — INSULIN ASPART 100 UNIT/ML ~~LOC~~ SOLN
0.0000 [IU] | Freq: Every day | SUBCUTANEOUS | Status: DC
  Administered 2011-10-09: 2 [IU] via SUBCUTANEOUS

## 2011-10-09 MED ORDER — NITROGLYCERIN 0.4 MG SL SUBL: 0.4000 mg | SUBLINGUAL_TABLET | SUBLINGUAL | Status: DC | PRN

## 2011-10-09 MED ORDER — TAMSULOSIN HCL 0.4 MG PO CAPS
0.4000 mg | ORAL_CAPSULE | Freq: Every day | ORAL | Status: DC
  Administered 2011-10-09: 0.4 mg via ORAL
  Filled 2011-10-09 (×2): qty 1

## 2011-10-09 MED ORDER — OLMESARTAN 10 MG HALF TABLET
10.0000 mg | ORAL_TABLET | Freq: Every day | ORAL | Status: DC
  Administered 2011-10-09: 10 mg via ORAL
  Filled 2011-10-09 (×2): qty 1

## 2011-10-09 MED ORDER — CYCLOBENZAPRINE HCL 10 MG PO TABS: 5.0000 mg | ORAL_TABLET | Freq: Three times a day (TID) | ORAL | Status: DC | PRN

## 2011-10-09 MED ORDER — POTASSIUM CHLORIDE CRYS ER 20 MEQ PO TBCR
40.0000 meq | EXTENDED_RELEASE_TABLET | Freq: Every day | ORAL | Status: DC
  Administered 2011-10-09: 40 meq via ORAL
  Filled 2011-10-09 (×2): qty 2

## 2011-10-09 MED ORDER — ASPIRIN 81 MG PO CHEW
81.0000 mg | CHEWABLE_TABLET | Freq: Once | ORAL | Status: AC
  Administered 2011-10-09: 81 mg via ORAL
  Filled 2011-10-09: qty 1

## 2011-10-09 MED ORDER — AMLODIPINE BESYLATE 10 MG PO TABS
10.0000 mg | ORAL_TABLET | Freq: Every day | ORAL | Status: DC
  Administered 2011-10-09: 10 mg via ORAL
  Filled 2011-10-09 (×2): qty 1

## 2011-10-09 MED ORDER — MORPHINE SULFATE 4 MG/ML IJ SOLN
4.0000 mg | Freq: Once | INTRAMUSCULAR | Status: AC
  Administered 2011-10-09: 4 mg via INTRAVENOUS
  Filled 2011-10-09: qty 1

## 2011-10-09 MED ORDER — ASPIRIN EC 81 MG PO TBEC
81.0000 mg | DELAYED_RELEASE_TABLET | Freq: Every day | ORAL | Status: DC
  Filled 2011-10-09: qty 1

## 2011-10-09 MED ORDER — ACETAMINOPHEN 325 MG PO TABS
650.0000 mg | ORAL_TABLET | ORAL | Status: DC | PRN
  Administered 2011-10-09: 650 mg via ORAL
  Filled 2011-10-09: qty 2

## 2011-10-09 MED ORDER — SIMVASTATIN 20 MG PO TABS
20.0000 mg | ORAL_TABLET | Freq: Every day | ORAL | Status: DC
  Filled 2011-10-09 (×2): qty 1

## 2011-10-09 MED ORDER — METOPROLOL SUCCINATE ER 100 MG PO TB24
100.0000 mg | ORAL_TABLET | Freq: Every day | ORAL | Status: DC
  Administered 2011-10-09: 100 mg via ORAL
  Filled 2011-10-09 (×2): qty 1

## 2011-10-09 MED ORDER — SODIUM CHLORIDE 0.9 % IV SOLN
250.0000 mL | INTRAVENOUS | Status: DC | PRN
  Administered 2011-10-09: 250 mL via INTRAVENOUS

## 2011-10-09 MED ORDER — SODIUM CHLORIDE 0.9 % IJ SOLN: 3.0000 mL | INTRAMUSCULAR | Status: DC | PRN

## 2011-10-09 MED ORDER — GABAPENTIN 300 MG PO CAPS
300.0000 mg | ORAL_CAPSULE | Freq: Two times a day (BID) | ORAL | Status: DC
  Administered 2011-10-09: 300 mg via ORAL
  Filled 2011-10-09 (×3): qty 1

## 2011-10-09 MED ORDER — ONDANSETRON HCL 4 MG/2ML IJ SOLN: 4.0000 mg | Freq: Four times a day (QID) | INTRAMUSCULAR | Status: DC | PRN

## 2011-10-09 MED ORDER — ONDANSETRON HCL 4 MG/2ML IJ SOLN
4.0000 mg | Freq: Once | INTRAMUSCULAR | Status: AC
  Administered 2011-10-09: 4 mg via INTRAVENOUS
  Filled 2011-10-09: qty 2

## 2011-10-09 MED ORDER — GABAPENTIN 300 MG PO CAPS
300.0000 mg | ORAL_CAPSULE | Freq: Two times a day (BID) | ORAL | Status: DC
  Filled 2011-10-09: qty 1

## 2011-10-09 MED ORDER — HEPARIN SOD (PORCINE) IN D5W 100 UNIT/ML IV SOLN
1150.0000 [IU]/h | INTRAVENOUS | Status: DC
  Administered 2011-10-09: 25000 [IU] via INTRAVENOUS
  Administered 2011-10-10: 1150 [IU]/h via INTRAVENOUS
  Filled 2011-10-09 (×2): qty 250

## 2011-10-09 MED ORDER — INSULIN NPH (HUMAN) (ISOPHANE) 100 UNIT/ML ~~LOC~~ SUSP
35.0000 [IU] | Freq: Every day | SUBCUTANEOUS | Status: DC
  Administered 2011-10-10: 35 [IU] via SUBCUTANEOUS
  Filled 2011-10-09: qty 10

## 2011-10-09 MED ORDER — INSULIN NPH (HUMAN) (ISOPHANE) 100 UNIT/ML ~~LOC~~ SUSP
25.0000 [IU] | Freq: Every day | SUBCUTANEOUS | Status: DC
  Administered 2011-10-09: 25 [IU] via SUBCUTANEOUS

## 2011-10-09 MED ORDER — ASPIRIN 81 MG PO CHEW
324.0000 mg | CHEWABLE_TABLET | ORAL | Status: AC
  Administered 2011-10-09: 324 mg via ORAL
  Filled 2011-10-09: qty 3

## 2011-10-09 MED ORDER — CLONIDINE HCL 0.2 MG PO TABS
0.2000 mg | ORAL_TABLET | Freq: Two times a day (BID) | ORAL | Status: DC
  Administered 2011-10-09: 0.2 mg via ORAL
  Filled 2011-10-09 (×3): qty 1

## 2011-10-09 MED ORDER — HEPARIN (PORCINE) IN NACL 100-0.45 UNIT/ML-% IJ SOLN
1000.0000 [IU]/h | Freq: Once | INTRAMUSCULAR | Status: AC
  Administered 2011-10-09: 1000 [IU]/h via INTRAVENOUS

## 2011-10-09 MED ORDER — ASPIRIN 300 MG RE SUPP
300.0000 mg | RECTAL | Status: AC
  Filled 2011-10-09: qty 1

## 2011-10-09 NOTE — Progress Notes (Signed)
ANTICOAGULATION CONSULT NOTE - Initial Consult  Pharmacy Consult for Heparin Indication: chest pain/ACS  Assessment: 61 yo female started on heparin IV bolus 5000 units (~70 units/kg) and infusion rate 1000 units/hr (~14 units/kg/hr) by Dr. Myrtis Ser for chest pain.  Patient reports having heparin in the past without any issues.  No bleeding reported currently. Will continue with current rate and check 6-hour heparin level, which may be on the higher end of the range, although patient reports that her weight fluctuates due to fluid retention.  Goal of Therapy:  Heparin level 0.3-0.7 units/ml   Plan:  1. Continue heparin IV infusion at 1000 units/hr. 2. Check 6-hour heparin level 3. Daily CBC/Heparin Level  Ival Bible 10/09/2011,2:16 PM  Allergies  Allergen Reactions  . Diphenhydramine Hcl     REACTION: tachycardia  . Lisinopril Other (See Comments)    Cough secondary to ACE  . Oxycodone-Acetaminophen     REACTION: heart races    Patient Measurements:   Patient-reported dry weight: 214 lb (97.3kg) Patient-reported height: 5'1" Heparin Dosing Weight: 71kg  Vital Signs: Temp: 97.9 F (36.6 C) (01/05 0653) Temp src: Oral (01/05 0653) BP: 120/63 mmHg (01/05 1407) Pulse Rate: 80  (01/05 1407)  Labs:  Basename 10/09/11 0652  HGB 11.4*  HCT 35.2*  PLT 238  APTT --  LABPROT --  INR --  HEPARINUNFRC --  CREATININE 0.79  CKTOTAL --  CKMB --  TROPONINI --   The CrCl is unknown because both a height and weight (above a minimum accepted value) are required for this calculation.  Medical History: Past Medical History  Diagnosis Date  . Chest pain 11/07    Dr Jens Som cath : LAD luminal irregularities, ramus intermediate with superior branch 70% stenosis, circumflex 50% stenosis, marginal 50% stenosis, well preserved ejection fraction.  . Diabetes mellitus type II     Insulin dependent. Has worked with Lupita Leash R previously.  Marland Kitchen Hypertension     Requires 5 drug therapy  and still difficult to control.  Marland Kitchen GERD (gastroesophageal reflux disease)   . Hyperlipidemia     On daily statin  . Obesity     Morbid. HAs worked with Lupita Leash T previously.  . Colonic polyp 1995    Colonoscopies 1994, 2000, and 02/02/2011. Last had 9 polyps - Tubular adenoma and tubulovillous was the pathology results without high grade dysplasia  . OSA (obstructive sleep apnea) 02/16/2007    Moderate AHI 35.6, O2 sat decreased to 65%, CPAP titration to 16 with AHI of 3.6.  Marland Kitchen Chronic pain     MR 08/07/10 :  Spondylosis L4-5 with B foraminal narrowing and L4 nerve root enchroachment B.  . Seasonal allergies     seasonal  . 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 S/p lumpectomy and radiation.  . Iron deficiency anemia     Ferritin was 12 in 2012. on FeSo4. Has had colon and EGD 02/02/11 that showed mild gastritis and colon polyps.  . Chronic venous insufficiency 2012    Has had full W/U incl ECHO, LFT's, Cr, Umicro, TSH, BNP. Symptomatic treatment.    Medications:   (Not in a hospital admission) Scheduled:    . aspirin  81 mg Oral Once  . heparin      . heparin  1,000 Units/hr Intravenous Once  .  morphine injection  4 mg Intravenous Once  . nitroGLYCERIN  1 inch Topical Once  . ondansetron (ZOFRAN) IV  4  mg Intravenous Once   Infusions:

## 2011-10-09 NOTE — Progress Notes (Signed)
ANTICOAGULATION CONSULT NOTE - Follow Up Consult  Pharmacy Consult for Heparin Indication: chest pain/ACS  Assessment: 61 y.o F on heparin for ACS sx with a slightly SUBtherapeutic heparin level this evening. Pt received bolus of 5000 units and started at rate of 1000 units/hr around 1330 today per Dr. Myrtis Ser for chest pain. No s/sx of bleeding noted.   Goal of Therapy:  Heparin level 0.3-0.7 units/ml   Plan:  1. Will increase heparin drip rate to 1150 units/hr (11.5 ml/hr) 2. Will continue to monitor for any signs/symptoms of bleeding and will follow up with heparin level in the a.m  Georgina Pillion, PharmD, BCPS 10/09/2011 9:07 PM      Allergies  Allergen Reactions  . Diphenhydramine Hcl     REACTION: tachycardia  . Lisinopril Other (See Comments)    Cough secondary to ACE  . Oxycodone-Acetaminophen     REACTION: heart races    Patient Measurements: Height: 5\' 1"  (154.9 cm) Weight: 224 lb 3.3 oz (101.7 kg) IBW/kg (Calculated) : 47.8  Heparin Dosing Weight: 72.3 kg   Vital Signs: Temp: 98.1 F (36.7 C) (01/05 2029) Temp src: Oral (01/05 2029) BP: 140/77 mmHg (01/05 2029) Pulse Rate: 78  (01/05 2029)  Labs:  Basename 10/09/11 2015 10/09/11 0652  HGB -- 11.4*  HCT -- 35.2*  PLT -- 238  APTT 65* --  LABPROT 13.2 --  INR 0.98 --  HEPARINUNFRC 0.29* --  CREATININE -- 0.79  CKTOTAL -- --  CKMB -- --  TROPONINI -- --   Estimated Creatinine Clearance: 81.9 ml/min (by C-G formula based on Cr of 0.79).   Medications:  Prescriptions prior to admission  Medication Sig Dispense Refill  . amLODipine (NORVASC) 10 MG tablet Take 1 tablet (10 mg total) by mouth daily.  90 tablet  3  . atorvastatin (LIPITOR) 80 MG tablet Take 1 tablet (80 mg total) by mouth daily.  30 tablet  11  . Bilberry, Vaccinium myrtillus, (BILBERRY PO) Take 1,000 mg by mouth daily.        . Blood Glucose Monitoring Suppl (WAVESENSE PRESTO) W/DEVICE KIT 1 each by Does not apply route 4 (four)  times daily -  before meals and at bedtime.  100 each  11  . Cholecalciferol (VITAMIN D3) 400 UNITS CAPS Take 400 Int'l Units by mouth at bedtime.        . cloNIDine (CATAPRES) 0.2 MG tablet Take 1 tablet (0.2 mg total) by mouth 2 (two) times daily.  60 tablet  11  . cyclobenzaprine (FLEXERIL) 5 MG tablet Take 1 tablet (5 mg total) by mouth every 8 (eight) hours as needed for muscle spasms.  60 tablet  1  . gabapentin (NEURONTIN) 300 MG capsule Take 300 mg by mouth 2 (two) times daily.        Marland Kitchen glucose blood test strip wavesense presto- use as instructed to check blood sugar 3x daily before meals & bedtime  100 each  11  . ibuprofen (ADVIL,MOTRIN) 800 MG tablet Take 800 mg by mouth every 6 (six) hours as needed. pain       . Insulin Syringe-Needle U-100 (INSULIN SYRINGE .5CC/28G) 28G X 1/2" 0.5 ML MISC as directed.        . irbesartan (AVAPRO) 150 MG tablet Take 1 tablet (150 mg total) by mouth daily.  90 tablet  3  . Lancets 30G MISC Use to check blood sugar twice a day       . loratadine (CLARITIN) 10 MG tablet Take  10 mg by mouth 2 (two) times daily as needed. For allergies       . metoprolol (TOPROL-XL) 100 MG 24 hr tablet Take 1 tablet (100 mg total) by mouth daily.  30 tablet  11  . nitroGLYCERIN (NITROSTAT) 0.4 MG SL tablet Place 1 tablet (0.4 mg total) under the tongue every 5 (five) minutes as needed for chest pain.  30 tablet  2  . potassium chloride SA (K-DUR,KLOR-CON) 20 MEQ tablet Take 2 tablets (40 mEq total) by mouth daily.  60 tablet  3  . Tamsulosin HCl (FLOMAX) 0.4 MG CAPS Take 0.4 mg by mouth daily.        . vitamin B-12 (CYANOCOBALAMIN) 500 MCG tablet Take 500 mcg by mouth daily.         Scheduled:    . amLODipine  10 mg Oral Daily  . aspirin  324 mg Oral NOW   Or  . aspirin  300 mg Rectal NOW  . aspirin  81 mg Oral Once  . aspirin EC  81 mg Oral Daily  . cloNIDine  0.2 mg Oral BID  . gabapentin  300 mg Oral BID  . gabapentin  300 mg Oral BID  . heparin  1,000 Units/hr  Intravenous Once  . insulin aspart  0-15 Units Subcutaneous TID WC  . insulin aspart  0-5 Units Subcutaneous QHS  . insulin NPH  25 Units Subcutaneous QHS  . insulin NPH  35 Units Subcutaneous QAC breakfast  . metoprolol  100 mg Oral Daily  .  morphine injection  4 mg Intravenous Once  . nitroGLYCERIN  1 inch Topical Once  . nitroGLYCERIN  1 inch Topical Q6H  . olmesartan  10 mg Oral Daily  . ondansetron (ZOFRAN) IV  4 mg Intravenous Once  . potassium chloride SA  40 mEq Oral Daily  . simvastatin  20 mg Oral Daily  . sodium chloride  3 mL Intravenous Q12H  . Tamsulosin HCl  0.4 mg Oral Daily

## 2011-10-09 NOTE — ED Notes (Signed)
Pt assisted to bedside commode.  Stand-by assist.

## 2011-10-09 NOTE — H&P (Signed)
History and Physical  Patient ID: [name] MRN: [id number], SOB: 1951/07/09 61 y.o. Date of Encounter: 10/09/2011, 2:12 PM  Primary Physician: Blanch Media, MD Primary Cardiologist: Rollene Rotunda, MD  Reason for Admission: 61 year old female, history of nonobstructive CAD by previous catheterization in 2007, normal LVF, and multiple cardiac risk factors, including IDDM, presents to the ED complaining of recurrent left shoulder and arm pain.  She states that her symptoms developed yesterday while walking at the mall, with quite significant, but nevertheless elected to not seek medical attention at that time. Her discomfort has been intermittent since then, and currently associated with significant left arm pain. She has had an x-ray of the left shoulder, revealing no evidence of fracture. Prior to arrival, patient took one NTG at home, with no significant effect. Initial set of cardiac markers is negative, and EKG indicates no significant changes.  Patient presents with no antecedent history of exertional chest pain. She was seen by our group on one previous occasion, in June of 2012, by Dr. Antoine Poche, who recommended further evaluation with a Lexiscan stress test and a 21 day of monitor. The patient was unable to have the cardiac monitor placed, citing financial constraints, but indicated today that she did have the pharmacologic nuclear imaging study done, although no records are currently available for review.    Past Medical History  Diagnosis Date  . Chest pain 11/07    Dr Jens Som cath : LAD luminal irregularities, ramus intermediate with superior branch 70% stenosis, circumflex 50% stenosis, marginal 50% stenosis, well preserved ejection fraction.  . Diabetes mellitus type II     Insulin dependent. Has worked with Lupita Leash R previously.  Marland Kitchen Hypertension     Requires 5 drug therapy and still difficult to control.  Marland Kitchen GERD (gastroesophageal reflux disease)   . Hyperlipidemia    On daily statin  . Obesity     Morbid. HAs worked with Lupita Leash T previously.  . Colonic polyp 1995    Colonoscopies 1994, 2000, and 02/02/2011. Last had 9 polyps - Tubular adenoma and tubulovillous was the pathology results without high grade dysplasia  . OSA (obstructive sleep apnea) 02/16/2007    Moderate AHI 35.6, O2 sat decreased to 65%, CPAP titration to 16 with AHI of 3.6.  Marland Kitchen Chronic pain     MR 08/07/10 :  Spondylosis L4-5 with B foraminal narrowing and L4 nerve root enchroachment B.  . Seasonal allergies     seasonal  . 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 S/p lumpectomy and radiation.  . Iron deficiency anemia     Ferritin was 12 in 2012. on FeSo4. Has had colon and EGD 02/02/11 that showed mild gastritis and colon polyps.  . Chronic venous insufficiency 2012    Has had full W/U incl ECHO, LFT's, Cr, Umicro, TSH, BNP. Symptomatic treatment.     Surgical History:  Past Surgical History  Procedure Date  . Mastectomy 2006    For breast CA  . Colonoscopy w/ polypectomy 1994, 2000, 2012  . Total abdominal hysterectomy 1985  . Carotid dopplers 08/24/2007    Normal. Done 2/2 dizziness.  . Cardiac catheterization 2007    Non obstructing CAD - [facility]     Home Meds: Prior to Admission medications   Medication Sig Start Date End Date Taking? Authorizing Provider  amLODipine (NORVASC) 10 MG tablet Take 1 tablet (10 mg total) by mouth daily. 03/24/11  Yes Blanch Media  aspirin 81  MG EC tablet Take 81 mg by mouth daily.     Yes Historical Provider, MD  atorvastatin (LIPITOR) 80 MG tablet Take 1 tablet (80 mg total) by mouth daily. 07/14/11  Yes Elizabeth Butcher  Bilberry, Vaccinium myrtillus, (BILBERRY PO) Take 1,000 mg by mouth daily.     Yes Historical Provider, MD  Blood Glucose Monitoring Suppl (WAVESENSE PRESTO) W/DEVICE KIT 1 each by Does not apply route 4 (four) times daily -  before meals and at bedtime. 04/28/11  Yes Blanch Media  Cholecalciferol (VITAMIN D3) 400 UNITS CAPS Take 400 Int'l Units by mouth at bedtime.     Yes Historical Provider, MD  cloNIDine (CATAPRES) 0.2 MG tablet Take 1 tablet (0.2 mg total) by mouth 2 (two) times daily. 11/11/10 11/12/11 Yes Donia Guiles, MD  cyclobenzaprine (FLEXERIL) 5 MG tablet Take 1 tablet (5 mg total) by mouth every 8 (eight) hours as needed for muscle spasms. 05/05/11 05/04/12 Yes Blanch Media  gabapentin (NEURONTIN) 300 MG capsule Take 300 mg by mouth 2 (two) times daily.   06/09/11  Yes Christopher Pribula  glucose blood test strip wavesense presto- use as instructed to check blood sugar 3x daily before meals & bedtime 04/28/11  Yes Blanch Media  ibuprofen (ADVIL,MOTRIN) 800 MG tablet Take 800 mg by mouth every 6 (six) hours as needed. pain    Yes Historical Provider, MD  insulin NPH-insulin regular (NOVOLIN 70/30) (70-30) 100 UNIT/ML injection 35 units SQ in morning before breakfast 25 units SQ in evening before supper 01/27/11  Yes Bradley Bowen  Insulin Syringe-Needle U-100 (INSULIN SYRINGE .5CC/28G) 28G X 1/2" 0.5 ML MISC as directed.     Yes Historical Provider, MD  irbesartan (AVAPRO) 150 MG tablet Take 1 tablet (150 mg total) by mouth daily. 09/07/11  Yes Blanch Media  Lancets 30G MISC Use to check blood sugar twice a day    Yes Historical Provider, MD  loratadine (CLARITIN) 10 MG tablet Take 10 mg by mouth 2 (two) times daily as needed. For allergies    Yes Historical Provider, MD  metFORMIN (GLUCOPHAGE) 1000 MG tablet Take 1 tablet (1,000 mg total) by mouth 2 (two) times daily with meals. 11/11/10 11/12/11 Yes Donia Guiles, MD  metoprolol (TOPROL-XL) 100 MG 24 hr tablet Take 1 tablet (100 mg total) by mouth daily. 11/11/10 11/12/11 Yes Donia Guiles, MD  nitroGLYCERIN (NITROSTAT) 0.4 MG SL tablet Place 1 tablet (0.4 mg total) under the tongue every 5 (five) minutes as needed for chest pain. 03/09/11 03/08/12 Yes Elizabeth Butcher  potassium chloride SA (K-DUR,KLOR-CON) 20  MEQ tablet Take 2 tablets (40 mEq total) by mouth daily. 06/11/11 06/11/12 Yes Christopher Pribula  Tamsulosin HCl (FLOMAX) 0.4 MG CAPS Take 0.4 mg by mouth daily.     Yes Historical Provider, MD  vitamin B-12 (CYANOCOBALAMIN) 500 MCG tablet Take 500 mcg by mouth daily.     Yes Historical Provider, MD    Allergies:  Allergies  Allergen Reactions  . Diphenhydramine Hcl     REACTION: tachycardia  . Lisinopril Other (See Comments)    Cough secondary to ACE  . Oxycodone-Acetaminophen     REACTION: heart races    History   Social History  . Marital Status: Divorced    Spouse Name: N/A    Number of Children: N/A  . Years of Education: N/A   Occupational History  . Not on file.   Social History Main Topics  . Smoking status: Never Smoker   . Smokeless tobacco: Not on  file  . Alcohol Use: No  . Drug Use: No  . Sexually Active: Not on file   Other Topics Concern  . Not on file   Social History Narrative   Works at [employer] par time. Single, lives by self.     Family History  Problem Relation Age of Onset  . Depression Daughter 59  . Osteoarthritis Maternal Grandmother   . Lung cancer Mother   . Diabetes Father   . Breast cancer Sister   . Pulmonary embolism Brother     Review of Systems: General: negative for chills, fever, night sweats or weight changes.  Cardiovascular: negative for chest pain, dyspnea on exertion, edema, orthopnea, palpitations, paroxysmal nocturnal dyspnea or shortness of breath Dermatological: negative for rash Respiratory: negative for cough or wheezing Urologic: negative for hematuria Abdominal: negative for nausea, vomiting, diarrhea, bright red blood per rectum, melena, or hematemesis Neurologic: negative for visual changes, syncope, or dizziness All other systems reviewed and are otherwise negative except as noted above.  Labs:   Lab Results  Component Value Date   WBC 7.0 10/09/2011   HGB 11.4* 10/09/2011   HCT 35.2* 10/09/2011    MCV 86.7 10/09/2011   PLT 238 10/09/2011    Lab 10/09/11 0652  NA 137  K 3.9  CL 103  CO2 25  BUN 14  CREATININE 0.79  CALCIUM 9.7  PROT --  BILITOT --  ALKPHOS --  ALT --  AST --  GLUCOSE 170*   No results found for this basename: CKTOTAL:4,CKMB:4,TROPONINI:4 in the last 72 hours Lab Results  Component Value Date   CHOL 224* 07/13/2011   HDL 57 07/13/2011   LDLCALC 147* 07/13/2011   TRIG 98 07/13/2011   Lab Results  Component Value Date   DDIMER  Value: 0.51        AT THE INHOUSE ESTABLISHED CUTOFF VALUE OF 0.48 ug/mL FEU, THIS ASSAY HAS BEEN DOCUMENTED IN THE LITERATURE TO HAVE A SENSITIVITY AND NEGATIVE PREDICTIVE VALUE OF AT LEAST 98 TO 99%.  THE TEST RESULT SHOULD BE CORRELATED WITH AN ASSESSMENT OF THE CLINICAL PROBABILITY OF DVT / VTE.* 10/28/2010    Radiology/Studies:  Dg Chest 2 View  10/09/2011  *RADIOLOGY REPORT*  Clinical Data: Shortness of breath and chest pressure/pain  CHEST - 2 VIEW  Comparison: December 31, 2010  Findings: The cardiac silhouette, mediastinum, pulmonary vasculature are within normal limits.  Both lungs are clear. There is no acute bony abnormality.  IMPRESSION: There is no evidence of acute cardiac or pulmonary process.  Original Report Authenticated By: Brandon Melnick, M.D.   Dg Shoulder Left  10/09/2011  *RADIOLOGY REPORT*  Clinical Data: Injury/pain  LEFT SHOULDER - 2+ VIEW  Comparison: None.  Findings: Degenerative changes of the left shoulder.  No fracture or dislocation is seen.  Visualized left lung is clear.  IMPRESSION: No fracture or dislocation is seen.  Degenerative changes of the left shoulder.  Original Report Authenticated By: Charline Bills, M.D.     EKG: Normal sinus rhythm with first-degree AV block at 73 bpm; normal axis; nonspecific ST changes  Physical Exam: Blood pressure 120/63, pulse 80, temperature 97.9 F (36.6 C), temperature source Oral, resp. rate 15, last menstrual period 07/12/1984, SpO2 100.00%. General: 61 year old  female, morbidly obese, lying supine, in no acute distress. Head: Normocephalic, atraumatic, sclera non-icteric, no xanthomas, nares are without discharge.  Neck: Negative for carotid bruits. JVD not elevated. Lungs: Clear bilaterally to auscultation without wheezes, rales, or rhonchi. Breathing is  unlabored. Heart: RRR with S1 S2. No murmurs, rubs, or gallops appreciated. Abdomen: Protuberant, intact bowel sounds. Msk:  Strength and tone appear normal for age. Extremities: No significant peripheral edema.   Neuro: Alert and oriented X 3. Moves all extremities spontaneously. Psych:  Responds to questions appropriately with a normal affect.     ASSESSMENT AND PLAN:  1  Atypical chest pain  A  noncritical CAD with 70% ramus intermedius stenosis, by cardiac catheterization, 2007  B  normal LVF 2  Left shoulder discomfort  A  no evidence of fracture by x-ray 3  IDDM 4  HTN 5  Hyperlipidemia 6  Morbid obesity 7  Syncope, recurrent   PLAN:  Patient will be admitted for overnight observation and continued close monitoring. Symptoms are atypical for active ischemia, with persistent symptoms for approximately 24 hours. Initial cardiac markers are negative, and no obvious ischemic changes noted on EKG. Will continue cycling cardiac markers, with further recommendations to follow, pending her clinical course.  Signed, Gene Serpe PA-C 10/09/2011, 2:12 PM  Patient seen and examined. I agree with the assessment and plan as detailed above. See also my additional thoughts below.   The patient does have known mild coronary disease. Her symptoms today do not sound classic for cardiac disease. However, there seemed to be a response to nitroglycerin. Therefore she will be admitted to watch her enzymes and to assess her further.  Willa Rough, MD, [facility] 10/09/2011 3:43 PM

## 2011-10-09 NOTE — ED Notes (Signed)
Pt c/o headache. Katheryn,pa notified.

## 2011-10-09 NOTE — ED Notes (Signed)
PT. REPORTS LEFT ARM PAIN ONSET YESTERDAY AFTERNOON RADIATING TO LEFT SHOULDER ,  SOB WHEN LYING DOWN , CHEST PRESSURE YESTERDAY .  TOOK 1 NTG SL PTA AND BUFFERIN PTA WITH NO RELIEF.

## 2011-10-09 NOTE — ED Provider Notes (Signed)
History     CSN: [id number]  Arrival date & time 10/09/11  0865   First MD Initiated Contact with Patient 10/09/11 [phone]      Chief Complaint  Patient presents with  . Arm Pain    (Consider location/radiation/quality/duration/timing/severity/associated sxs/prior treatment) Patient is a 61 y.o. female presenting with chest pain. The history is provided by the patient.  Chest Pain Primary symptoms include shortness of breath. Pertinent negatives for primary symptoms include no fever, no fatigue, no cough, no palpitations, no abdominal pain, no nausea, no vomiting and no dizziness.  Pertinent negatives for associated symptoms include no weakness.   Pt presents with L arm pain and chest pain which began yesterday afternoon. She states that she was out shopping when she began to experience a sensation of intermittent chest pressure. She denies associated dyspnea, diaphoresis, nausea, vomiting. She awoke this morning from sleep around 0200 with left shoulder/upper arm pain which is sharp and throbbing in nature. Radiates to the L jaw. States "it feels like my arm is broken in 3 places." She has had similar sx with the arm for approx the last 3 months intermittently. She was seen by her PCP; this was felt to be likely neuropathic in nature. The episode this morning was not grossly different from previous. Took 1 NTG this AM along with 3 81mg  ASA with no relief.  Additionally states she has hx CHF; feels short of breath with lying flat. Has some chronic peripheral swelling but "feels like I have some fluid on me."  She has hx of CHF and CAD; cardiac cath performed by Dr. Jens Som in 2007 showed: FINDINGS:  1. LV: 197/8/19. EF 60% without regional wall motion abnormality.  2. No aortic stenosis or mitral regurgitation.  3. Left main: Calcified plaque proximally with approximately 30%  stenosis at the ostium.  4. LAD: Moderate sized vessel with only minor luminal irregularities.  5. Ramus  intermedius: Moderate sized vessel. One small branch of  this has approximately 70% stenosis.  6. Circumflex: Very large dominant vessel giving rise to a very large  branching obtuse marginal and a small PDA. The AV groove  circumflex has a 50% stenosis just after the origin of the large  marginal. The marginal has a 50% stenosis proximally.  7. RCA: Small, nondominant vessel. There are only minor luminal  Irregularities.  Additionally has hx of hyperlipidemia; total chol 200+ in 07/2011 and LDL 140.  Past Medical History  Diagnosis Date  . Chest pain 11/07    Dr Jens Som cath : LAD luminal irregularities, ramus intermediate with superior branch 70% stenosis, circumflex 50% stenosis, marginal 50% stenosis, well preserved ejection fraction.  . Diabetes mellitus type II     Insulin dependent. Has worked with Lupita Leash R previously.  Marland Kitchen Hypertension     Requires 5 drug therapy and still difficult to control.  Marland Kitchen GERD (gastroesophageal reflux disease)   . Hyperlipidemia     On daily statin  . Obesity     Morbid. HAs worked with Lupita Leash T previously.  . Colonic polyp 1995    Colonoscopies 1994, 2000, and 02/02/2011. Last had 9 polyps - Tubular adenoma and tubulovillous was the pathology results without high grade dysplasia  . OSA (obstructive sleep apnea) 02/16/2007    Moderate AHI 35.6, O2 sat decreased to 65%, CPAP titration to 16 with AHI of 3.6.  Marland Kitchen Chronic pain     MR 08/07/10 :  Spondylosis L4-5 with B foraminal narrowing and L4 nerve root enchroachment  B.  . Seasonal allergies     seasonal  . 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 S/p lumpectomy and radiation.  . Iron deficiency anemia     Ferritin was 12 in 2012. on FeSo4. Has had colon and EGD 02/02/11 that showed mild gastritis and colon polyps.  . Chronic venous insufficiency 2012    Has had full W/U incl ECHO, LFT's, Cr, Umicro, TSH, BNP. Symptomatic treatment.    Past Surgical History    Procedure Date  . Mastectomy 2006    For breast CA  . Colonoscopy w/ polypectomy 1994, 2000, 2012  . Total abdominal hysterectomy 1985  . Carotid dopplers 08/24/2007    Normal. Done 2/2 dizziness.  . Cardiac catheterization 2007    Non obstructing CAD - [place]    Family History  Problem Relation Age of Onset  . Depression Daughter 9  . Osteoarthritis Maternal Grandmother   . Lung cancer Mother   . Diabetes Father   . Breast cancer Sister   . Pulmonary embolism Brother     History  Substance Use Topics  . Smoking status: Never Smoker   . Smokeless tobacco: Not on file  . Alcohol Use: No    OB History    Grav Para Term Preterm Abortions TAB SAB Ect Mult Living                  Review of Systems  Constitutional: Negative for fever, chills, activity change, appetite change and fatigue.  HENT: Positive for neck pain.   Eyes: Negative.   Respiratory: Positive for shortness of breath. Negative for cough.   Cardiovascular: Positive for chest pain. Negative for palpitations and leg swelling.  Gastrointestinal: Negative for nausea, vomiting and abdominal pain.  Musculoskeletal: Positive for myalgias. Negative for back pain.  Skin: Negative for pallor and rash.  Neurological: Negative for dizziness, syncope, weakness and light-headedness.    Allergies  Diphenhydramine hcl; Lisinopril; and Oxycodone-acetaminophen  Home Medications   Current Outpatient Rx  Name Route Sig Dispense Refill  . AMLODIPINE BESYLATE 10 MG PO TABS Oral Take 1 tablet (10 mg total) by mouth daily. 90 tablet 3  . ASPIRIN 81 MG PO TBEC Oral Take 81 mg by mouth daily.      . ATORVASTATIN CALCIUM 80 MG PO TABS Oral Take 1 tablet (80 mg total) by mouth daily. 30 tablet 11  . BILBERRY PO Oral Take 1,000 mg by mouth daily.      Katheren Puller PRESTO W/DEVICE KIT Does not apply 1 each by Does not apply route 4 (four) times daily -  before meals and at bedtime. 100 each 11  . VITAMIN D3 400 UNITS PO CAPS  Oral Take 400 Int'l Units by mouth at bedtime.      Marland Kitchen CLONIDINE HCL 0.2 MG PO TABS Oral Take 1 tablet (0.2 mg total) by mouth 2 (two) times daily. 60 tablet 11  . CYCLOBENZAPRINE HCL 5 MG PO TABS Oral Take 1 tablet (5 mg total) by mouth every 8 (eight) hours as needed for muscle spasms. 60 tablet 1  . GABAPENTIN 300 MG PO CAPS Oral Take 300 mg by mouth 2 (two) times daily.      Marland Kitchen GLUCOSE BLOOD VI STRP  wavesense presto- use as instructed to check blood sugar 3x daily before meals & bedtime 100 each 11  . IBUPROFEN 800 MG PO TABS Oral Take 800 mg by mouth every 6 (six) hours  as needed. pain     . INSULIN ISOPHANE & REGULAR (70-30) 100 UNIT/ML [place] SUSP  35 units SQ in morning before breakfast 25 units SQ in evening before supper 10 mL 11  . INSULIN SYRINGE 28G X 1/2" 0.5 ML MISC  as directed.      . IRBESARTAN 150 MG PO TABS Oral Take 1 tablet (150 mg total) by mouth daily. 90 tablet 3  . LANCETS 30G MISC  Use to check blood sugar twice a day     . LORATADINE 10 MG PO TABS Oral Take 10 mg by mouth 2 (two) times daily as needed. For allergies     . METFORMIN HCL 1000 MG PO TABS Oral Take 1 tablet (1,000 mg total) by mouth 2 (two) times daily with meals. 60 tablet 11  . METOPROLOL SUCCINATE ER 100 MG PO TB24 Oral Take 1 tablet (100 mg total) by mouth daily. 30 tablet 11  . NITROGLYCERIN 0.4 MG SL SUBL Sublingual Place 1 tablet (0.4 mg total) under the tongue every 5 (five) minutes as needed for chest pain. 30 tablet 2  . POTASSIUM CHLORIDE CRYS ER 20 MEQ PO TBCR Oral Take 2 tablets (40 mEq total) by mouth daily. 60 tablet 3  . TAMSULOSIN HCL 0.4 MG PO CAPS Oral Take 0.4 mg by mouth daily.      Marland Kitchen VITAMIN B-12 500 MCG PO TABS Oral Take 500 mcg by mouth daily.        BP 116/53  Pulse 76  Temp(Src) 97.9 F (36.6 C) (Oral)  Resp 18  SpO2 99%  LMP 07/12/1984  Physical Exam  Nursing note and vitals reviewed. Constitutional: She is oriented to person, place, and time. She appears well-developed and  well-nourished. No distress.  HENT:  Head: Normocephalic and atraumatic.  Eyes: Conjunctivae and EOM are normal.  Neck: Normal range of motion. Neck supple.       No carotid bruits appreciated  Cardiovascular: Normal rate, regular rhythm, normal heart sounds and intact distal pulses.   Pulmonary/Chest: Effort normal and breath sounds normal. No respiratory distress. She has no wheezes. She has no rales. She exhibits no tenderness.       No crackles  Abdominal: Soft. Bowel sounds are normal. There is no tenderness. There is no rebound and no guarding.  Musculoskeletal: Normal range of motion.       L arm/shoulder nontender to palpation. 5/5 strength in all planes at shoulder, elbow. Ext neurovasc intact with sensory intact to lt touch in radial, median, ulnar distributions. Grip strength equal with left.  Neurological: She is alert and oriented to person, place, and time.  Skin: Skin is warm and dry. No rash noted. She is not diaphoretic.  Psychiatric: She has a normal mood and affect.    ED Course  Procedures (including critical care time)   Date: 10/09/2011  Rate: 73  Rhythm: normal sinus rhythm  QRS Axis: normal  Intervals: normal  ST/T Wave abnormalities: normal  Conduction Disutrbances:first-degree A-V block   Narrative Interpretation:   Old EKG Reviewed: as compared with 03/09/11, no 1st deg AVB seen on old; otherwise unchanged  Labs Reviewed  CBC - Abnormal; Notable for the following:    Hemoglobin 11.4 (*)    HCT 35.2 (*)    RDW 16.1 (*)    All other components within normal limits  BASIC METABOLIC PANEL - Abnormal; Notable for the following:    Glucose, Bld 170 (*)    GFR calc non Af Denyse Dago  89 (*)    All other components within normal limits  GLUCOSE, CAPILLARY - Abnormal; Notable for the following:    Glucose-Capillary 114 (*)    All other components within normal limits  DIFFERENTIAL  POCT I-STAT TROPONIN I  PRO B NATRIURETIC PEPTIDE  POCT I-STAT TROPONIN I    I-STAT TROPONIN I  I-STAT TROPONIN I  HEPARIN LEVEL (UNFRACTIONATED)   Dg Chest 2 View  10/09/2011  *RADIOLOGY REPORT*  Clinical Data: Shortness of breath and chest pressure/pain  CHEST - 2 VIEW  Comparison: December 31, 2010  Findings: The cardiac silhouette, mediastinum, pulmonary vasculature are within normal limits.  Both lungs are clear. There is no acute bony abnormality.  IMPRESSION: There is no evidence of acute cardiac or pulmonary process.  Original Report Authenticated By: Brandon Melnick, M.D.     1. Hypertension   2. Hyperlipidemia   3. HTN (hypertension)   4. Edema   5. Diabetes mellitus type II   6. Diabetes mellitus   7. HYPERTENSION   8. DIABETES MELLITUS, TYPE II       MDM  12:45 PM Pt's pain was relieved with nitropaste in the ED. Although the pt's troponin is negative, this sounds somewhat suspicious for possible cardiac etiology. Review of the old chart indicates that in 2007 she had negative enzymes with chest pain but had positive stress test. Discussed with Dr. Myrtis Ser with [facility]. He will consult and make recs.  Pt to be admitted to cardiology service for further chest pain eval.        Grant Fontana, PA 10/09/11 1858

## 2011-10-10 ENCOUNTER — Other Ambulatory Visit: Payer: Self-pay

## 2011-10-10 DIAGNOSIS — R079 Chest pain, unspecified: Secondary | ICD-10-CM

## 2011-10-10 LAB — CARDIAC PANEL(CRET KIN+CKTOT+MB+TROPI)
CK, MB: 1.9 ng/mL (ref 0.3–4.0)
CK, MB: 2.3 ng/mL (ref 0.3–4.0)
Relative Index: 1 (ref 0.0–2.5)
Relative Index: 1.1 (ref 0.0–2.5)
Troponin I: <0.3 ng/mL (ref <0.30)
Troponin I: <0.3 ng/mL (ref <0.30)

## 2011-10-10 LAB — LIPID PANEL
Cholesterol: 147 mg/dL (ref 0–200)
LDL Cholesterol: 79 mg/dL (ref 0–99)
VLDL: 20 mg/dL (ref 0–40)

## 2011-10-10 LAB — CBC
HCT: 33.2 % — ABNORMAL LOW (ref 36.0–46.0)
MCHC: 31.9 g/dL (ref 30.0–36.0)
MCV: 86.9 fL (ref 78.0–100.0)
Platelets: 213 10*3/uL (ref 150–400)
RDW: 16.2 % — ABNORMAL HIGH (ref 11.5–15.5)
WBC: 7 10*3/uL (ref 4.0–10.5)

## 2011-10-10 LAB — HEPARIN LEVEL (UNFRACTIONATED): Heparin Unfractionated: 0.36 IU/mL (ref 0.30–0.70)

## 2011-10-10 MED ORDER — ISOSORBIDE MONONITRATE ER 30 MG PO TB24: 30.0000 mg | ORAL_TABLET | Freq: Every day | ORAL | Status: DC

## 2011-10-10 MED ORDER — METFORMIN HCL 1000 MG PO TABS: 1000.0000 mg | ORAL_TABLET | Freq: Two times a day (BID) | ORAL | Status: DC

## 2011-10-10 MED ORDER — NITROGLYCERIN 0.4 MG SL SUBL: 0.4000 mg | SUBLINGUAL_TABLET | SUBLINGUAL | Status: DC | PRN

## 2011-10-10 MED ORDER — INSULIN NPH ISOPHANE & REGULAR (70-30) 100 UNIT/ML ~~LOC~~ SUSP: SUBCUTANEOUS | Status: DC

## 2011-10-10 MED ORDER — ASPIRIN 81 MG PO TBEC: 81.0000 mg | DELAYED_RELEASE_TABLET | Freq: Every day | ORAL | Status: DC

## 2011-10-10 NOTE — Progress Notes (Signed)
SUBJECTIVE: pt w/o further c/o left shoulder/arm pain. She continues to report no anterior CP. Serial cardiac markers were negative.   Filed Vitals:   10/09/11 1530 10/09/11 1728 10/09/11 2029 10/10/11 0404  BP: 120/70 143/68 140/77 122/72  Pulse: 74 80 78 69  Temp:  98.4 F (36.9 C) 98.1 F (36.7 C) 97.6 F (36.4 C)  TempSrc:   Oral Oral  Resp:  18 18 18   Height:  5\' 1"  (1.549 m)    Weight:  224 lb 3.3 oz (101.7 kg)    SpO2: 99% 97% 100% 97%   No intake or output data in the 24 hours ending 10/10/11 0818  LABS: Basic Metabolic Panel:  Basename 10/09/11 0652  NA 137  K 3.9  CL 103  CO2 25  GLUCOSE 170*  BUN 14  CREATININE 0.79  CALCIUM 9.7  MG --  PHOS --   Liver Function Tests: No results found for this basename: AST:2,ALT:2,ALKPHOS:2,BILITOT:2,PROT:2,ALBUMIN:2 in the last 72 hours No results found for this basename: LIPASE:2,AMYLASE:2 in the last 72 hours CBC:  Basename 10/10/11 0505 10/09/11 0652  WBC 7.0 7.0  NEUTROABS -- 3.7  HGB 10.6* 11.4*  HCT 33.2* 35.2*  MCV 86.9 86.7  PLT 213 238   Cardiac Enzymes:  Basename 10/10/11 0530 10/09/11 2339  CKTOTAL 186* 214*  CKMB 1.9 2.3  CKMBINDEX -- --  TROPONINI <0.30 <0.30   BNP: No components found with this basename: POCBNP:3 D-Dimer: No results found for this basename: DDIMER:2 in the last 72 hours Hemoglobin A1C:  Basename 10/09/11 1654  HGBA1C 8.6*   Fasting Lipid Panel:  Basename 10/10/11 0530  CHOL 147  HDL 48  LDLCALC 79  TRIG 100  CHOLHDL 3.1  LDLDIRECT --   Thyroid Function Tests:  Basename 10/09/11 2015  TSH 4.738*  T4TOTAL --  T3FREE --  THYROIDAB --   Anemia Panel: No results found for this basename: VITAMINB12,FOLATE,FERRITIN,TIBC,IRON,RETICCTPCT in the last 72 hours  RADIOLOGY: Dg Chest 2 View  10/09/2011  *RADIOLOGY REPORT*  Clinical Data: Shortness of breath and chest pressure/pain  CHEST - 2 VIEW  Comparison: December 31, 2010  Findings: The cardiac silhouette,  mediastinum, pulmonary vasculature are within normal limits.  Both lungs are clear. There is no acute bony abnormality.  IMPRESSION: There is no evidence of acute cardiac or pulmonary process.  Original Report Authenticated By: Brandon Melnick, M.D.   Dg Shoulder Left  10/09/2011  *RADIOLOGY REPORT*  Clinical Data: Injury/pain  LEFT SHOULDER - 2+ VIEW  Comparison: None.  Findings: Degenerative changes of the left shoulder.  No fracture or dislocation is seen.  Visualized left lung is clear.  IMPRESSION: No fracture or dislocation is seen.  Degenerative changes of the left shoulder.  Original Report Authenticated By: Charline Bills, M.D.    PHYSICAL EXAM: GENERAL: 61 year old female, obese, lying supine; NAD HEENT: NCAT, PERRLA, EOMI; sclera clear; no xanthelasma NECK: palpable bilateral carotid pulses, no bruits; no JVD; no TM LUNGS: CTA bilaterally CARDIAC: RRR (S1, S2); no significant murmurs; no rubs or gallops ABDOMEN: soft, non-tender; intact BS EXTREMETIES: intact distal pulses; no significant peripheral edema SKIN: warm/dry; no obvious rash/lesions MUSCULOSKELETAL: no joint deformity NEURO: no focal deficit; NL affect  ASSESSMENT AND PLAN: 1 Left shoulder/arm discomfort  A no evidence of fracture by x-ray  2 CAD   A  negative cardiac markers  B h/o noncritical CAD with 70% ramus intermedius stenosis; NL LVF, 2007  3 IDDM  4 HTN  5 Hyperlipidemia  6 Morbid  obesity  7 h/o Syncope, recurrent   PLAN: No further cardiac workup recommended. Plan is to discharge and to reestablish her with Dr. Rollene Rotunda, here in our [place] office. Will add Imdur, and otherwise continue home medications. We'll defer to him for further recommendations, regarding possible stress testing for risk stratification. Patient will also would need continued close followup with her primary team, here at [facility].  Gene Serpe   Attending note:  Patient seen and examined. Reviewed history and findings with Mr.  Shara Blazing She has ruled out for myocardial infarction, chest x-ray and shoulder x-ray nonacute. She ambulated in the hall this morning without shoulder or neck discomfort.   On examination blood pressure 122/72, heart rate 69, no acute distress, lungs clear, cor with regular rate and rhythm, no rub or gallop.  Plan is to discharge home, arrange close followup with Dr. Antoine Poche. Also adding Imdur to her as antianginal. She does have history of a ramus stenosis that has been managed medically. May need a followup Myoview for reassessment.  Jonelle Sidle, M.D., F.A.C.C.

## 2011-10-10 NOTE — Discharge Summary (Signed)
Physician Discharge Summary  Patient ID: [name] MRN: [id number] DOB/AGE: [birth year] 61 y.o.  Admit date: 10/09/2011 Discharge date: 10/10/2011  Primary Cardiologist: Rollene Rotunda, MD  Primary Discharge Diagnosis: Atypical chest pain  Secondary Discharge Diagnoses: Chest pain  11/07     Dr Jens Som cath : LAD luminal irregularities, ramus intermediate with superior branch 70% stenosis, circumflex 50% stenosis, marginal 50% stenosis, well preserved ejection fraction.   .  Diabetes mellitus type II      Insulin dependent. Has worked with Lupita Leash R previously.   Marland Kitchen  Hypertension      Requires 5 drug therapy and still difficult to control.   Marland Kitchen  GERD (gastroesophageal reflux disease)    .  Hyperlipidemia      On daily statin   .  Obesity      Morbid. HAs worked with Lupita Leash T previously.   .  Colonic polyp  1995     Colonoscopies 1994, 2000, and 02/02/2011. Last had 9 polyps - Tubular adenoma and tubulovillous was the pathology results without high grade dysplasia   .  OSA (obstructive sleep apnea)  02/16/2007     Moderate AHI 35.6, O2 sat decreased to 65%, CPAP titration to 16 with AHI of 3.6.   Marland Kitchen  Chronic pain      MR 08/07/10 : Spondylosis L4-5 with B foraminal narrowing and L4 nerve root enchroachment B.   .  Seasonal allergies      seasonal   .  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 S/p lumpectomy and radiation.   .  Iron deficiency anemia      Ferritin was 12 in 2012. on FeSo4. Has had colon and EGD 02/02/11 that showed mild gastritis and colon polyps.   .  Chronic venous insufficiency  2012     Has had full W/U incl ECHO, LFT's, Cr, Umicro, TSH, BNP. Symptomatic treatment.     Reason for Admission: 61 year old female, history of nonobstructive CAD by previous catheterization in 2007, normal LVF, and multiple cardiac risk factors, including IDDM, presented to the ED complaining of recurrent left shoulder and arm pain.  Procedures:  none  Hospital Course: Patient was admitted for overnight observation, and ruled out for MI with negative cardiac markers. She was maintained on IV heparin and NTP in the interim, and reported no further left shoulder/arm discomfort, the following morning. An x-ray ithe ED of left shoulder was negative for fracture.  Plan is to add Imdur 30 mg daily, to her previous home medication regimen, and defer to Rollene Rotunda, MD at followup, regarding possible stress testing for stratification.  Discharge Vitals: Blood pressure 122/72, pulse 69, temperature 97.6 F (36.4 C), temperature source Oral, resp. rate 18, height 5\' 1"  (1.549 m), weight 224 lb 3.3 oz (101.7 kg), last menstrual period 07/12/1984, SpO2 97.00%.  Labs:   Lab Results  Component Value Date   WBC 7.0 10/10/2011   HGB 10.6* 10/10/2011   HCT 33.2* 10/10/2011   MCV 86.9 10/10/2011   PLT 213 10/10/2011    Lab 10/09/11 0652  NA 137  K 3.9  CL 103  CO2 25  BUN 14  CREATININE 0.79  CALCIUM 9.7  PROT --  BILITOT --  ALKPHOS --  ALT --  AST --  GLUCOSE 170*   Lab Results  Component Value Date   CKTOTAL 186* 10/10/2011   CKMB 1.9 10/10/2011   TROPONINI <0.30 10/10/2011  Lab Results  Component Value Date   CHOL 147 10/10/2011   CHOL 224* 07/13/2011   CHOL  Value: 207        ATP III CLASSIFICATION:  <200     mg/dL   Desirable  811-914  mg/dL   Borderline High  >=782    mg/dL   High       * 9/56/2130   Lab Results  Component Value Date   HDL 48 10/10/2011   HDL 57 07/13/2011   HDL 58 8/65/7846   Lab Results  Component Value Date   LDLCALC 79 10/10/2011   LDLCALC 147* 07/13/2011   LDLCALC  Value: 135        Total Cholesterol/HDL:CHD Risk Coronary Heart Disease Risk Table                     Men   Women  1/2 Average Risk   3.4   3.3  Average Risk       5.0   4.4  2 X Average Risk   9.6   7.1  3 X Average Risk  23.4   11.0        Use the calculated Patient Ratio above and the CHD Risk Table to determine the patient's CHD Risk.        ATP  III CLASSIFICATION (LDL):  <100     mg/dL   Optimal  962-952  mg/dL   Near or Above                    Optimal  130-159  mg/dL   Borderline  841-324  mg/dL   High  >401     mg/dL   Very High* 0/27/2536   Lab Results  Component Value Date   TRIG 100 10/10/2011   TRIG 98 07/13/2011   TRIG 70 10/29/2010   Lab Results  Component Value Date   CHOLHDL 3.1 10/10/2011   CHOLHDL 3.9 07/13/2011   CHOLHDL 3.6 10/29/2010   Lab Results  Component Value Date   LDLDIRECT 153* 07/13/2011     DISPOSITION: Stable condition  FOLLOW UP PLANS AND APPOINTMENTS: Discharge Orders    Future Orders Please Complete By Expires   Diet - low sodium heart healthy      Increase activity slowly        Follow-up Information    Follow up with [facility] .         DISCHARGE MEDICATIONS: Current Discharge Medication List    START taking these medications   Details  isosorbide mononitrate (IMDUR) 30 MG 24 hr tablet Take 1 tablet (30 mg total) by mouth daily. Qty: 30 tablet, Refills: 6      CONTINUE these medications which have CHANGED   Details  nitroGLYCERIN (NITROSTAT) 0.4 MG SL tablet Place 1 tablet (0.4 mg total) under the tongue every 5 (five) minutes as needed for chest pain. Qty: 25 tablet, Refills: 1      CONTINUE these medications which have NOT CHANGED   Details  amLODipine (NORVASC) 10 MG tablet Take 1 tablet (10 mg total) by mouth daily. Qty: 90 tablet, Refills: 3   Associated Diagnoses: Diabetes mellitus; Unspecified essential hypertension    atorvastatin (LIPITOR) 80 MG tablet Take 1 tablet (80 mg total) by mouth daily. Qty: 30 tablet, Refills: 11   Associated Diagnoses: Hyperlipidemia    Bil[place], Vaccinium myrtillus, (BILBERRY PO) Take 1,000 mg by mouth daily.     Associated Diagnoses: Diabetes mellitus type II  Blood Glucose Monitoring Suppl (WAVESENSE PRESTO) W/DEVICE KIT 1 each by Does not apply route 4 (four) times daily -  before meals and at bedtime. Qty: 100 each,  Refills: 11    Cholecalciferol (VITAMIN D3) 400 UNITS CAPS Take 400 Int'l Units by mouth at bedtime.     Associated Diagnoses: Diabetes mellitus type II    cloNIDine (CATAPRES) 0.2 MG tablet Take 1 tablet (0.2 mg total) by mouth 2 (two) times daily. Qty: 60 tablet, Refills: 11   Associated Diagnoses: Diabetes mellitus; Unspecified essential hypertension    cyclobenzaprine (FLEXERIL) 5 MG tablet Take 1 tablet (5 mg total) by mouth every 8 (eight) hours as needed for muscle spasms. Qty: 60 tablet, Refills: 1    gabapentin (NEURONTIN) 300 MG capsule Take 300 mg by mouth 2 (two) times daily.      glucose blood test strip wavesense presto- use as instructed to check blood sugar 3x daily before meals & bedtime Qty: 100 each, Refills: 11    ibuprofen (ADVIL,MOTRIN) 800 MG tablet Take 800 mg by mouth every 6 (six) hours as needed. pain     Insulin Syringe-Needle U-100 (INSULIN SYRINGE .5CC/28G) 28G X 1/2" 0.5 ML MISC as directed.      irbesartan (AVAPRO) 150 MG tablet Take 1 tablet (150 mg total) by mouth daily. Qty: 90 tablet, Refills: 3   Associated Diagnoses: Hypertension    Lancets 30G MISC Use to check blood sugar twice a day     loratadine (CLARITIN) 10 MG tablet Take 10 mg by mouth 2 (two) times daily as needed. For allergies     metoprolol (TOPROL-XL) 100 MG 24 hr tablet Take 1 tablet (100 mg total) by mouth daily. Qty: 30 tablet, Refills: 11    potassium chloride SA (K-DUR,KLOR-CON) 20 MEQ tablet Take 2 tablets (40 mEq total) by mouth daily. Qty: 60 tablet, Refills: 3   Associated Diagnoses: HTN (hypertension); Edema    Tamsulosin HCl (FLOMAX) 0.4 MG CAPS Take 0.4 mg by mouth daily.      vitamin B-12 (CYANOCOBALAMIN) 500 MCG tablet Take 500 mcg by mouth daily.     Associated Diagnoses: Diabetes mellitus type II      STOP taking these medications     aspirin 81 MG EC tablet      insulin NPH-insulin regular (NOVOLIN 70/30) (70-30) 100 UNIT/ML injection      metFORMIN  (GLUCOPHAGE) 1000 MG tablet         BRING ALL MEDICATIONS WITH YOU TO FOLLOW UP APPOINTMENTS  Time spent with patient to include physician time: Greater than 30 minutes, including physician time.  Signed: Gene Bertil Brickey 10/10/2011, 8:40 AM Co-Sign MD

## 2011-10-10 NOTE — Progress Notes (Signed)
Pt given discharge instruction, medication administration, follow up appointments and when to call the doctor.  Pt verbalizes understanding. Thomas Hoff

## 2011-10-10 NOTE — ED Provider Notes (Signed)
Medical screening examination/treatment/procedure(s) were performed by non-physician practitioner and as supervising physician I was immediately available for consultation/collaboration.   Gwyneth Sprout, MD 10/10/11 [phone]

## 2011-10-10 NOTE — Discharge Summary (Signed)
See rounding note. 

## 2011-10-18 ENCOUNTER — Ambulatory Visit (INDEPENDENT_AMBULATORY_CARE_PROVIDER_SITE_OTHER): Payer: Medicaid Other | Admitting: Internal Medicine

## 2011-10-18 ENCOUNTER — Encounter: Payer: Self-pay | Admitting: Internal Medicine

## 2011-10-18 DIAGNOSIS — Z0000 Encounter for general adult medical examination without abnormal findings: Secondary | ICD-10-CM

## 2011-10-18 DIAGNOSIS — R079 Chest pain, unspecified: Secondary | ICD-10-CM

## 2011-10-18 DIAGNOSIS — G4733 Obstructive sleep apnea (adult) (pediatric): Secondary | ICD-10-CM

## 2011-10-18 DIAGNOSIS — G8929 Other chronic pain: Secondary | ICD-10-CM

## 2011-10-18 DIAGNOSIS — R609 Edema, unspecified: Secondary | ICD-10-CM

## 2011-10-18 DIAGNOSIS — E119 Type 2 diabetes mellitus without complications: Secondary | ICD-10-CM

## 2011-10-18 DIAGNOSIS — I10 Essential (primary) hypertension: Secondary | ICD-10-CM

## 2011-10-18 DIAGNOSIS — E785 Hyperlipidemia, unspecified: Secondary | ICD-10-CM

## 2011-10-18 MED ORDER — ATORVASTATIN CALCIUM 80 MG PO TABS
80.0000 mg | ORAL_TABLET | Freq: Every day | ORAL | Status: DC
Start: 2011-10-18 — End: 2012-05-16

## 2011-10-18 MED ORDER — TAMSULOSIN HCL 0.4 MG PO CAPS: 0.4000 mg | ORAL_CAPSULE | Freq: Every day | ORAL | Status: DC

## 2011-10-18 MED ORDER — METOPROLOL SUCCINATE ER 100 MG PO TB24
100.0000 mg | ORAL_TABLET | Freq: Every day | ORAL | Status: DC
Start: 2011-10-18 — End: 2012-05-16

## 2011-10-18 MED ORDER — CYCLOBENZAPRINE HCL 5 MG PO TABS: 5.0000 mg | ORAL_TABLET | Freq: Three times a day (TID) | ORAL | Status: DC | PRN

## 2011-10-18 MED ORDER — CLONIDINE HCL 0.2 MG PO TABS: 0.2000 mg | ORAL_TABLET | Freq: Two times a day (BID) | ORAL | Status: DC

## 2011-10-18 MED ORDER — GABAPENTIN 300 MG PO CAPS: 300.0000 mg | ORAL_CAPSULE | Freq: Two times a day (BID) | ORAL | Status: DC

## 2011-10-18 MED ORDER — AMLODIPINE BESYLATE 10 MG PO TABS: 10.0000 mg | ORAL_TABLET | Freq: Every day | ORAL | Status: DC

## 2011-10-18 MED ORDER — POTASSIUM CHLORIDE CRYS ER 20 MEQ PO TBCR: 40.0000 meq | EXTENDED_RELEASE_TABLET | Freq: Every day | ORAL | Status: DC

## 2011-10-18 MED ORDER — IRBESARTAN 150 MG PO TABS: 150.0000 mg | ORAL_TABLET | Freq: Every day | ORAL | Status: DC

## 2011-10-18 NOTE — Progress Notes (Signed)
I agree with Dr. Wildman-Tobriner's assessment and plan. 

## 2011-10-18 NOTE — Assessment & Plan Note (Signed)
Lipid panel in the hospital last week was excellent, with LDL of 79. We'll continue current anti-lipid therapy.

## 2011-10-18 NOTE — Assessment & Plan Note (Signed)
Patient was admitted for chest pain rule out on 10/09/11, which turned out to be negative. Given her many risk factors, [facility] would like to followup with her. Unfortunately they have not contacted the patient, so she says she will call them. She may need a stress test for risk stratification, but that'll be at the discretion of Dr. Antoine Poche.

## 2011-10-18 NOTE — Assessment & Plan Note (Addendum)
A1c in the hospital was 8.3, up from 7.4 3 months ago. The patient states she takes her insulin regularly without missing doses, and also takes her metformin as prescribed. She was quite surprised that had gone up. She was frustrated with this result. This was discussed at the very end of the visit, and we do not have time to thoroughly evaluate this issue. I recommended that the patient set up a meeting with Lupita Leash in the next week or 2 to evaluate her diabetes. She was amenable to this plan.

## 2011-10-18 NOTE — Progress Notes (Signed)
Subjective:     Patient ID: [name], female   DOB: 06-01-1951, 61 y.o.   MRN: [id number]  HPI Patient is a very pleasant 61-year-old woman with history of CAD, hypertension, hyperlipidemia, OSA, chronic pain, diabetes who was recently in the hospital for chest pain workup, which turned out to be negative. She is here for followup. The patient reports no further episodes of chest pain, and the excruciating arm pain that was one of the things that brought her in has also eased off. She says she is doing her shoulder exercises, walking around more, and trying to stay moving. She is supposed to followup with [facility], but they have not contacted her. She is going to followup with them.  Review of Systems Mild lateral left arm pain.    Objective:   Physical Exam Physical Exam GEN: NAD.  Alert and oriented x 3.  Pleasant, conversant, and cooperative to exam. RESP:  CTAB, no w/r/r CARDIOVASCULAR: RRR, S1, S2, 2/6 HSM @ [facility] EXT: warm and dry. No edema in b/l LE L shoulder: good ROM, no TTP, no crepitus.  Mildly reduced grip strength in L vs R.  Good strength proximally.  No pain with rotator cuff maneuvers.     Assessment:         Plan:

## 2011-10-18 NOTE — Assessment & Plan Note (Signed)
Patient recently switched pharmacies to [facility], so I transferred all of her prescriptions to [facility].

## 2011-10-18 NOTE — Assessment & Plan Note (Addendum)
The patient complained of excruciating left lateral arm pain that had been one of the instigating factors that brought her to the hospital last week for evaluation (along with jaw pain). Her cardiac workup was negative. A left shoulder plain film was obtained that showed only mild degenerative disease. On exam the patient has good range of motion, good strength, but mildly decreased grip strength. She says she's been doing shoulder exercises with good success. I encouraged her to continue these exercises. I'm not exactly sure if her decreased grip strength is secondary to a radiculopathy from cervical disease or if her lateral shoulder pain would fit that scenario. On the other hand, lateral shoulder pain is a common manifestation of rotator cuff pathology, which is another possibility. However, her rotator cuff testing on physical exam was unremarkable. For now, encouraged patient to continue being as mobile as possible, and we will reevaluate at her next visit.

## 2011-10-18 NOTE — Assessment & Plan Note (Signed)
Patient complained of some dry mouth with CPAP use. The patient uses a full mask, which should help keep her mouth closed to force nasal breathing. She does use humidified air. Beyond that, I am not sure why she is having such dry mouth. I suspect she is in fact breathing through her mouth despite the mask usage. I recommended she to call the supplier, advance, for advice. She was amenable to this plan.

## 2011-10-21 ENCOUNTER — Encounter: Payer: Self-pay | Admitting: Cardiology

## 2011-10-21 ENCOUNTER — Ambulatory Visit (INDEPENDENT_AMBULATORY_CARE_PROVIDER_SITE_OTHER): Payer: Medicaid Other | Admitting: Cardiology

## 2011-10-21 VITALS — BP 99/60 | HR 70 | Ht 61.0 in | Wt 223.0 lb

## 2011-10-21 DIAGNOSIS — E669 Obesity, unspecified: Secondary | ICD-10-CM

## 2011-10-21 DIAGNOSIS — R079 Chest pain, unspecified: Secondary | ICD-10-CM

## 2011-10-21 DIAGNOSIS — I2510 Atherosclerotic heart disease of native coronary artery without angina pectoris: Secondary | ICD-10-CM

## 2011-10-21 DIAGNOSIS — I10 Essential (primary) hypertension: Secondary | ICD-10-CM

## 2011-10-21 DIAGNOSIS — E785 Hyperlipidemia, unspecified: Secondary | ICD-10-CM

## 2011-10-21 NOTE — Assessment & Plan Note (Signed)
The blood pressure is at target. No change in medications is indicated. We will continue with therapeutic lifestyle changes (TLC).  

## 2011-10-21 NOTE — Patient Instructions (Signed)
The current medical regimen is effective;  continue present plan and medications.  Your physician has requested that you have a lexiscan myoview. For further information please visit [email or web address]. Please follow instruction sheet, as given.   

## 2011-10-21 NOTE — Assessment & Plan Note (Signed)
The patient understands the need to lose weight with diet and exercise. We have discussed specific strategies for this.  

## 2011-10-21 NOTE — Assessment & Plan Note (Signed)
Given her symptoms and her previous nonobstructive coronary disease stress perfusion imaging is indicated. She wants to try to walk upon not convinced she will be able to. She can can be converted to pharmacologic imaging if she's not able to complete the treadmill.

## 2011-10-21 NOTE — Assessment & Plan Note (Signed)
I reviewed her cholesterol with an LDL calculated of 79. HDL was 48. Total was 147. She will continue the meds as listed.

## 2011-10-21 NOTE — Progress Notes (Signed)
HPI The patient was admitted recently for evaluation of chest discomfort. She ruled out for myocardial infarction. No further testing was thought to be indicated at that time but she was referred back here to followup and consider stress perfusion imaging given her symptoms and previous nonobstructive coronary disease. She says that since discharge and progressively over weeks she's been getting more dyspnea walking up stairs than she used to. She has to climb stairs to her apartment every day. She feels like she's getting fluid in her lungs or some fullness in her upper chest. She's more fatigued and dyspneic when she gets to the top of the stairs. She's not describing any PND or orthopnea. She's not describing new complications, presyncope or syncope. She does have some chronic and perhaps slightly worse lower extremity edema. She says she watches her salt and fluid. Her weights have been stable. I did note that her BNP in the hospital was below 100. Her hemoglobin A1c was 8.6.  Allergies  Allergen Reactions  . Diphenhydramine Hcl     REACTION: tachycardia  . Lisinopril Other (See Comments)    Cough secondary to ACE  . Oxycodone-Acetaminophen     REACTION: heart races    Current Outpatient Prescriptions  Medication Sig Dispense Refill  . amLODipine (NORVASC) 10 MG tablet Take 1 tablet (10 mg total) by mouth daily.  90 tablet  3  . aspirin EC 81 MG EC tablet Take 1 tablet (81 mg total) by mouth daily.      Marland Kitchen atorvastatin (LIPITOR) 80 MG tablet Take 1 tablet (80 mg total) by mouth daily.  30 tablet  11  . Bilberry, Vaccinium myrtillus, (BILBERRY PO) Take 1,000 mg by mouth daily.        . Blood Glucose Monitoring Suppl (WAVESENSE PRESTO) W/DEVICE KIT 1 each by Does not apply route 4 (four) times daily -  before meals and at bedtime.  100 each  11  . Cholecalciferol (VITAMIN D3) 400 UNITS CAPS Take 400 Int'l Units by mouth at bedtime.        . cloNIDine (CATAPRES) 0.2 MG tablet Take 1 tablet  (0.2 mg total) by mouth 2 (two) times daily.  60 tablet  11  . co-enzyme Q-10 50 MG capsule Take 50 mg by mouth daily.      . cyclobenzaprine (FLEXERIL) 5 MG tablet Take 1 tablet (5 mg total) by mouth every 8 (eight) hours as needed for muscle spasms.  60 tablet  1  . gabapentin (NEURONTIN) 300 MG capsule Take 1 capsule (300 mg total) by mouth 2 (two) times daily.  60 capsule  3  . glucose blood test strip wavesense presto- use as instructed to check blood sugar 3x daily before meals & bedtime  100 each  11  . ibuprofen (ADVIL,MOTRIN) 800 MG tablet Take 800 mg by mouth every 6 (six) hours as needed. pain       . insulin NPH-insulin regular (NOVOLIN 70/30) (70-30) 100 UNIT/ML injection 35 units SQ in morning before breakfast 25 units SQ in evening before supper  10 mL  11  . Insulin Syringe-Needle U-100 (INSULIN SYRINGE .5CC/28G) 28G X 1/2" 0.5 ML MISC as directed.        . irbesartan (AVAPRO) 150 MG tablet Take 1 tablet (150 mg total) by mouth daily.  90 tablet  3  . isosorbide mononitrate (IMDUR) 30 MG 24 hr tablet Take 1 tablet (30 mg total) by mouth daily.  30 tablet  6  . Lancets 30G  MISC Use to check blood sugar twice a day       . loratadine (CLARITIN) 10 MG tablet Take 10 mg by mouth 2 (two) times daily as needed. For allergies       . metFORMIN (GLUCOPHAGE) 1000 MG tablet Take 1 tablet (1,000 mg total) by mouth 2 (two) times daily with a meal.  60 tablet  11  . metoprolol succinate (TOPROL-XL) 100 MG 24 hr tablet Take 1 tablet (100 mg total) by mouth daily.  30 tablet  11  . nitroGLYCERIN (NITROSTAT) 0.4 MG SL tablet Place 1 tablet (0.4 mg total) under the tongue every 5 (five) minutes as needed for chest pain.  25 tablet  1  . potassium chloride SA (K-DUR,KLOR-CON) 20 MEQ tablet Take 2 tablets (40 mEq total) by mouth daily.  60 tablet  3  . Tamsulosin HCl (FLOMAX) 0.4 MG CAPS Take 1 capsule (0.4 mg total) by mouth daily.  30 capsule  1  . vitamin B-12 (CYANOCOBALAMIN) 500 MCG tablet Take  500 mcg by mouth daily.        Marland Kitchen DISCONTD: gabapentin (NEURONTIN) 300 MG capsule Take 1 capsule (300 mg total) by mouth 3 (three) times daily.  90 capsule  11  . DISCONTD: gabapentin (NEURONTIN) 300 MG capsule Take 1 capsule (300 mg total) by mouth 2 (two) times daily.  90 capsule  3    Past Medical History  Diagnosis Date  . Chest pain 11/07    Dr Jens Som cath : LAD luminal irregularities, ramus intermediate with superior branch 70% stenosis, circumflex 50% stenosis, marginal 50% stenosis, well preserved ejection fraction.  . Diabetes mellitus type II     Insulin dependent. Has worked with Lupita Leash R previously.  Marland Kitchen Hypertension     Requires 5 drug therapy and still difficult to control.  Marland Kitchen GERD (gastroesophageal reflux disease)   . Hyperlipidemia     On daily statin  . Obesity     Morbid. HAs worked with Lupita Leash T previously.  . Colonic polyp 1995    Colonoscopies 1994, 2000, and 02/02/2011. Last had 9 polyps - Tubular adenoma and tubulovillous was the pathology results without high grade dysplasia  . OSA (obstructive sleep apnea) 02/16/2007    Moderate AHI 35.6, O2 sat decreased to 65%, CPAP titration to 16 with AHI of 3.6.  Marland Kitchen Chronic pain     MR 08/07/10 :  Spondylosis L4-5 with B foraminal narrowing and L4 nerve root enchroachment B.  . Seasonal allergies     seasonal  . 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 S/p lumpectomy and radiation.  . Iron deficiency anemia     Ferritin was 12 in 2012. on FeSo4. Has had colon and EGD 02/02/11 that showed mild gastritis and colon polyps.  . Chronic venous insufficiency 2012    Has had full W/U incl ECHO, LFT's, Cr, Umicro, TSH, BNP. Symptomatic treatment.    Past Surgical History  Procedure Date  . Mastectomy 2006    For breast CA  . Colonoscopy w/ polypectomy 1994, 2000, 2012  . Total abdominal hysterectomy 1985  . Carotid dopplers 08/24/2007    Normal. Done 2/2 dizziness.  . Cardiac  catheterization 2007    Non obstructing CAD - [place]    Family History  Problem Relation Age of Onset  . Depression Daughter 67  . Osteoarthritis Maternal Grandmother   . Lung cancer Mother   . Diabetes Father   . Breast  cancer Sister   . Pulmonary embolism Brother     History   Social History  . Marital Status: Divorced    Spouse Name: N/A    Number of Children: N/A  . Years of Education: N/A   Occupational History  . Not on file.   Social History Main Topics  . Smoking status: Never Smoker   . Smokeless tobacco: Not on file  . Alcohol Use: No  . Drug Use: No  . Sexually Active: Not on file   Other Topics Concern  . Not on file   Social History Narrative   Works at [employer] par time. Single, lives by self.    ROS: As stated in the HPI and negative for all other systems.   PHYSICAL EXAM BP 99/60  Pulse 70  Ht 5\' 1"  (1.549 m)  Wt 223 lb (101.152 kg)  BMI 42.14 kg/m2  LMP 07/12/1984 GENERAL:  Well appearing HEENT:  Pupils equal round and reactive, fundi not visualized, oral mucosa unremarkable NECK:  No jugular venous distention, waveform within normal limits, carotid upstroke brisk and symmetric, no bruits, no thyromegaly LYMPHATICS:  No cervical, inguinal adenopathy LUNGS:  Clear to auscultation bilaterally BACK:  No CVA tenderness CHEST:  Unremarkable HEART:  PMI not displaced or sustained,S1 and S2 within normal limits, no S3, no S4, no clicks, no rubs, no murmurs ABD:  Flat, positive bowel sounds normal in frequency in pitch, no bruits, no rebound, no guarding, no midline pulsatile mass, no hepatomegaly, no splenomegaly, obese EXT:  2 plus pulses throughout, diffuse upper and lower mild extremity edema, no cyanosis no clubbing SKIN:  No rashes no nodules NEURO:  Cranial nerves II through XII grossly intact, motor grossly intact throughout PSYCH:  Cognitively intact, oriented to person place and time  ASSESSMENT AND PLAN

## 2011-10-28 ENCOUNTER — Encounter (HOSPITAL_COMMUNITY): Payer: Medicaid Other | Admitting: Radiology

## 2011-10-28 ENCOUNTER — Ambulatory Visit (HOSPITAL_COMMUNITY): Payer: Medicaid Other | Attending: Cardiology | Admitting: Radiology

## 2011-10-28 VITALS — BP 142/72 | Ht 61.0 in | Wt 219.0 lb

## 2011-10-28 DIAGNOSIS — E785 Hyperlipidemia, unspecified: Secondary | ICD-10-CM

## 2011-10-28 DIAGNOSIS — R5383 Other fatigue: Secondary | ICD-10-CM | POA: Insufficient documentation

## 2011-10-28 DIAGNOSIS — R0989 Other specified symptoms and signs involving the circulatory and respiratory systems: Secondary | ICD-10-CM | POA: Insufficient documentation

## 2011-10-28 DIAGNOSIS — R42 Dizziness and giddiness: Secondary | ICD-10-CM | POA: Insufficient documentation

## 2011-10-28 DIAGNOSIS — R0602 Shortness of breath: Secondary | ICD-10-CM

## 2011-10-28 DIAGNOSIS — I2510 Atherosclerotic heart disease of native coronary artery without angina pectoris: Secondary | ICD-10-CM | POA: Insufficient documentation

## 2011-10-28 DIAGNOSIS — E119 Type 2 diabetes mellitus without complications: Secondary | ICD-10-CM | POA: Insufficient documentation

## 2011-10-28 DIAGNOSIS — I10 Essential (primary) hypertension: Secondary | ICD-10-CM | POA: Insufficient documentation

## 2011-10-28 DIAGNOSIS — R079 Chest pain, unspecified: Secondary | ICD-10-CM | POA: Insufficient documentation

## 2011-10-28 DIAGNOSIS — R5381 Other malaise: Secondary | ICD-10-CM | POA: Insufficient documentation

## 2011-10-28 DIAGNOSIS — R0609 Other forms of dyspnea: Secondary | ICD-10-CM | POA: Insufficient documentation

## 2011-10-28 DIAGNOSIS — Z794 Long term (current) use of insulin: Secondary | ICD-10-CM | POA: Insufficient documentation

## 2011-10-28 MED ORDER — TECHNETIUM TC 99M TETROFOSMIN IV KIT
33.0000 | PACK | Freq: Once | INTRAVENOUS | Status: AC | PRN
  Administered 2011-10-28: 33 via INTRAVENOUS

## 2011-10-28 MED ORDER — REGADENOSON 0.4 MG/5ML IV SOLN
0.4000 mg | Freq: Once | INTRAVENOUS | Status: AC
  Administered 2011-10-28: 0.4 mg via INTRAVENOUS

## 2011-10-28 MED ORDER — TECHNETIUM TC 99M TETROFOSMIN IV KIT
11.0000 | PACK | Freq: Once | INTRAVENOUS | Status: AC | PRN
  Administered 2011-10-28: 11 via INTRAVENOUS

## 2011-10-28 NOTE — Progress Notes (Signed)
[facility] SITE 3 NUCLEAR MED [address] [place] [address] [phone]  Cardiology Nuclear Med Study  [name] is a 61 y.o. female [id number] 1951-07-31   Nuclear Med Background Indication for Stress Test:  Evaluation for Ischemia and 10/09/11 Post Hospital: Chest Pain, (-) enzymes, no EKG changes History: 2012 CT/MR-Extensive calcificationsI,2012 Echo-EF-55-60%,2007 Heart Catheterization EF-60% N/O CAD and 2012 Myocardial Perfusion Study- EF-65% Apical thinning Cardiac Risk Factors: Hypertension, IDDM Type 2 and Lipids  Symptoms:  Chest Pain, DOE, Fatigue, Fatigue with Exertion, Light-Headedness and SOB   Nuclear Pre-Procedure Caffeine/Decaff Intake:  None NPO After: 9:00pm   Lungs:  Clear IV 0.9% NS with Angio Cath:  20g  IV Site: R Antecubital  IV Started by:  Stanton Kidney, EMT-P  Chest Size (in):  44 Cup Size: B  Height: 5\' 1"  (1.549 m)  Weight:  219 lb (99.338 kg)  BMI:  Body mass index is 41.38 kg/(m^2). Tech Comments:  CBG= 145 @ 9pm last night, per patient.    Nuclear Med Study 1 or 2 day study: 1 day  Stress Test Type:  Treadmill/Lexiscan  Reading MD: Willa Rough, MD  Order Authorizing Provider:  Rollene Rotunda, MD  Resting Radionuclide: Technetium 13m Tetrofosmin  Resting Radionuclide Dose: 11.0 mCi   Stress Radionuclide:  Technetium 70m Tetrofosmin  Stress Radionuclide Dose: 33.0 mCi           Stress Protocol Rest HR: 76 Stress HR: 127  Rest BP: 142/72 Stress BP: 161/72  Exercise Time (min): 3:47 METS: 4.6   Predicted Max HR: 160 bpm % Max HR: 79.38 bpm Rate Pressure Product: 21308   Dose of Adenosine (mg):  n/a Dose of Lexiscan: 0.4 mg  Dose of Atropine (mg): n/a Dose of Dobutamine: n/a mcg/kg/min (at max HR)  Stress Test Technologist: Bonnita Levan, RN  Nuclear Technologist:  Domenic Polite, CNMT     Rest Procedure:  Myocardial perfusion imaging was performed at rest 45 minutes following the intravenous administration of  Technetium 45m Tetrofosmin. Rest ECG: NSR  Stress Procedure:The patient attempted to walk the treadmill utilizing the Bruce Protocol for 3:47, but was unable to reach target heart rate due to extreme fatigue and dyspnea.  The patient then received IV Lexiscan 0.4 mg over 15-seconds with concurrent low level exercise and then Technetium 38m Tetrofosmin was injected at 30-seconds while the patient continued walking one more minute. The patient c/o CP(3/10) which was relieved with one NTG SL. There were no significant changes with Lexiscan.  Quantitative spect images were obtained after a 45-minute delay. Stress ECG: No significant change from baseline ECG  QPS Raw Data Images:  Patient motion noted; appropriate software correction applied. Stress Images:  Visually the images give the impression that there may be some decreased activity in the lateral wall with stress. This is not seen on the quantitative analysis. Rest Images:  Normal homogeneous uptake in all areas of the myocardium. Subtraction (SDS):  Visually there is question of some reversibility in the lateral wall but this is not supported by the quantitative analysis. Transient Ischemic Dilatation (Normal <1.22):  1.09 Lung/Heart Ratio (Normal <0.45):  0.28  Quantitative Gated Spect Images QGS EDV:  70 ml QGS ESV:  27 ml QGS cine images:  Normal Wall Motion QGS EF: 61%  Impression Exercise Capacity:  The patient started to walk on the treadmill but had dyspnea. Heart rate could not be achieved. The study was changed to Lexa scan with low level exercise. BP Response:  Normal blood pressure response. Clinical Symptoms:  Shortness of breath when walking ECG Impression:  No significant ST segment change suggestive of ischemia. Comparison with Prior Nuclear Study: See Below. Overall Impression:  The tomographic images suggest some decreased activity in the lateral wall with stress. This is not present at rest. This raises the question of  some ischemia in the lateral wall. However this is not supported by the quantitative analysis. Otherwise the images seem similar to the prior study of June, 2012. The variability in lateral wall uptake with stress was not present on the prior study. This raises the question of mild ischemia. However I cannot be sure of this is real.  Willa Rough, MD

## 2011-11-02 ENCOUNTER — Telehealth: Payer: Self-pay | Admitting: *Deleted

## 2011-11-02 NOTE — Telephone Encounter (Addendum)
Call from pt requesting a prescription for Cough Syrup.  Coughing up thick yellow phlegm.  Has occassional fevers at times.  Has been coughing so much that her chest hurts.Has been coughing for about 2 weeks worse this week.  Has this with her allergies.  Pt states that she had Sinusitis recently with a postnasal drip.   Would like to get the prescription sent the [facility] on [address].  Angelina Ok, RN 11/02/2011 9:13 AM.

## 2011-11-02 NOTE — Telephone Encounter (Signed)
Medicaid will not pay for a prescription cough med with codeine. I would rec any OTC med like Robitussin, etc.

## 2011-11-03 NOTE — Telephone Encounter (Signed)
Call to pt said that she has tried the Robitussin with no relief.  Went to pharmacy got the Alka-Seltzer Plus with no relief. Said that she just feels bad.  Spoke with Dr. Rogelia Boga pt will need to come in for an appointment as soon as possible.

## 2011-11-04 NOTE — Telephone Encounter (Signed)
Call to pt said that she does not feel any better.  Pt was given to scheduler to schedule an appointment to come in for assessment of her symptoms.  Angelina Ok, RN 11/04/2011 2:03 PM.

## 2011-11-05 ENCOUNTER — Ambulatory Visit (INDEPENDENT_AMBULATORY_CARE_PROVIDER_SITE_OTHER): Payer: Medicaid Other | Admitting: Internal Medicine

## 2011-11-05 ENCOUNTER — Encounter: Payer: Self-pay | Admitting: Internal Medicine

## 2011-11-05 VITALS — BP 126/73 | HR 72 | Temp 97.5°F | Ht 61.0 in | Wt 221.0 lb

## 2011-11-05 DIAGNOSIS — E119 Type 2 diabetes mellitus without complications: Secondary | ICD-10-CM

## 2011-11-05 DIAGNOSIS — J329 Chronic sinusitis, unspecified: Secondary | ICD-10-CM

## 2011-11-05 MED ORDER — AMOXICILLIN-POT CLAVULANATE 875-125 MG PO TABS: 1.0000 | ORAL_TABLET | Freq: Two times a day (BID) | ORAL | Status: AC

## 2011-11-05 MED ORDER — PHENYLEPHRINE-CHLORPHEN-DM 12.5-4-15 MG/5ML PO SYRP: 1.2500 mL | ORAL_SOLUTION | Freq: Four times a day (QID) | ORAL | Status: DC | PRN

## 2011-11-05 NOTE — Patient Instructions (Signed)
1. Please take all medications as prescribed.  2. If you have worsening of your symptoms or new symptoms arise, please call the clinic ([phone]), or go to the ER immediately if symptoms are severe. 

## 2011-11-06 DIAGNOSIS — E119 Type 2 diabetes mellitus without complications: Secondary | ICD-10-CM | POA: Insufficient documentation

## 2011-11-06 NOTE — Progress Notes (Signed)
Subjective:   Patient ID: [name] female   DOB: Sep 06, 1951 61 y.o.   MRN: [id number]  HPI: [name] is a 61 y.o. lady with history of CAD, hypertension, hyperlipidemia, OSA, chronic pain, diabetes who presents for an acute visit due to nasal congestion and sore throat. Patient reports having sneezing, sore throat, post nasal drip, scratching-like pain in her throat for about 2 weeks. It is associated with cough. She coughs up a little thick and yellow colored sputum without blood in it. She thinks that her sputum is coming from her nasal cavity rather than from lower respiratory tract. She tried Tussionex without help at home. Some times she feels cold and chills, but did not measure temperature. She also has mild chest pain which is aggravated by coughing, not but exertion.  Regarding her DM-II, she is taking her 70/30 insulin regularly (35 U in the morning and 25 in the evening). She brings in her meter record which showed 100% CBG in targeting range, though she only measured CBG a few times.  Denies SOB, palpitation, abdominal pain,diarrhea, constipation, dysuria, urgency, frequency, hematuria, joint pain or leg swelling.  Past Medical History  Diagnosis Date  . Chest pain 11/07    Dr Jens Som cath : LAD luminal irregularities, ramus intermediate with superior branch 70% stenosis, circumflex 50% stenosis, marginal 50% stenosis, well preserved ejection fraction.  . Diabetes mellitus type II     Insulin dependent. Has worked with Lupita Leash R previously.  Marland Kitchen Hypertension     Requires 5 drug therapy and still difficult to control.  Marland Kitchen GERD (gastroesophageal reflux disease)   . Hyperlipidemia     On daily statin  . Obesity     Morbid. HAs worked with Lupita Leash T previously.  . Colonic polyp 1995    Colonoscopies 1994, 2000, and 02/02/2011. Last had 9 polyps - Tubular adenoma and tubulovillous was the pathology results without high grade dysplasia  . OSA (obstructive sleep apnea)  02/16/2007    Moderate AHI 35.6, O2 sat decreased to 65%, CPAP titration to 16 with AHI of 3.6.  Marland Kitchen Chronic pain     MR 08/07/10 :  Spondylosis L4-5 with B foraminal narrowing and L4 nerve root enchroachment B.  . Seasonal allergies     seasonal  . 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 S/p lumpectomy and radiation.  . Iron deficiency anemia     Ferritin was 12 in 2012. on FeSo4. Has had colon and EGD 02/02/11 that showed mild gastritis and colon polyps.  . Chronic venous insufficiency 2012    Has had full W/U incl ECHO, LFT's, Cr, Umicro, TSH, BNP. Symptomatic treatment.   Current Outpatient Prescriptions  Medication Sig Dispense Refill  . amLODipine (NORVASC) 10 MG tablet Take 1 tablet (10 mg total) by mouth daily.  90 tablet  3  . aspirin EC 81 MG EC tablet Take 1 tablet (81 mg total) by mouth daily.      Marland Kitchen atorvastatin (LIPITOR) 80 MG tablet Take 1 tablet (80 mg total) by mouth daily.  30 tablet  11  . Bilberry, Vaccinium myrtillus, (BILBERRY PO) Take 1,000 mg by mouth daily.        . Blood Glucose Monitoring Suppl (WAVESENSE PRESTO) W/DEVICE KIT 1 each by Does not apply route 4 (four) times daily -  before meals and at bedtime.  100 each  11  . Cholecalciferol (VITAMIN D3) 400 UNITS CAPS Take 400 Int'l  Units by mouth at bedtime.        . cloNIDine (CATAPRES) 0.2 MG tablet Take 1 tablet (0.2 mg total) by mouth 2 (two) times daily.  60 tablet  11  . co-enzyme Q-10 50 MG capsule Take 50 mg by mouth daily.      . cyclobenzaprine (FLEXERIL) 5 MG tablet Take 1 tablet (5 mg total) by mouth every 8 (eight) hours as needed for muscle spasms.  60 tablet  1  . gabapentin (NEURONTIN) 300 MG capsule Take 1 capsule (300 mg total) by mouth 2 (two) times daily.  60 capsule  3  . glucose blood test strip wavesense presto- use as instructed to check blood sugar 3x daily before meals & bedtime  100 each  11  . ibuprofen (ADVIL,MOTRIN) 800 MG tablet Take 800 mg by  mouth every 6 (six) hours as needed. pain       . insulin NPH-insulin regular (NOVOLIN 70/30) (70-30) 100 UNIT/ML injection 35 units SQ in morning before breakfast 25 units SQ in evening before supper  10 mL  11  . Insulin Syringe-Needle U-100 (INSULIN SYRINGE .5CC/28G) 28G X 1/2" 0.5 ML MISC as directed.        . irbesartan (AVAPRO) 150 MG tablet Take 1 tablet (150 mg total) by mouth daily.  90 tablet  3  . isosorbide mononitrate (IMDUR) 30 MG 24 hr tablet Take 1 tablet (30 mg total) by mouth daily.  30 tablet  6  . Lancets 30G MISC Use to check blood sugar twice a day       . metFORMIN (GLUCOPHAGE) 1000 MG tablet Take 1 tablet (1,000 mg total) by mouth 2 (two) times daily with a meal.  60 tablet  11  . metoprolol succinate (TOPROL-XL) 100 MG 24 hr tablet Take 1 tablet (100 mg total) by mouth daily.  30 tablet  11  . nitroGLYCERIN (NITROSTAT) 0.4 MG SL tablet Place 1 tablet (0.4 mg total) under the tongue every 5 (five) minutes as needed for chest pain.  25 tablet  1  . potassium chloride SA (K-DUR,KLOR-CON) 20 MEQ tablet Take 2 tablets (40 mEq total) by mouth daily.  60 tablet  3  . Tamsulosin HCl (FLOMAX) 0.4 MG CAPS Take 1 capsule (0.4 mg total) by mouth daily.  30 capsule  1  . vitamin B-12 (CYANOCOBALAMIN) 500 MCG tablet Take 500 mcg by mouth daily.        Marland Kitchen amoxicillin-clavulanate (AUGMENTIN) 875-125 MG per tablet Take 1 tablet by mouth 2 (two) times daily.  14 tablet  0  . chlorpheniramine-phenylephrine-dextromethorphan (RONDEC DM) 12.02-04-14 MG/5ML SYRP Take 1.25 mLs by mouth every 6 (six) hours as needed. Take 1-2 teaspoons every 6-8 hours as needed for cough  280 mL  1  . loratadine (CLARITIN) 10 MG tablet Take 10 mg by mouth 2 (two) times daily as needed. For allergies       . DISCONTD: gabapentin (NEURONTIN) 300 MG capsule Take 1 capsule (300 mg total) by mouth 3 (three) times daily.  90 capsule  11  . DISCONTD: gabapentin (NEURONTIN) 300 MG capsule Take 1 capsule (300 mg total) by mouth  2 (two) times daily.  90 capsule  3   Family History  Problem Relation Age of Onset  . Depression Daughter 69  . Osteoarthritis Maternal Grandmother   . Lung cancer Mother   . Diabetes Father   . Breast cancer Sister   . Pulmonary embolism Brother    History   Social  History  . Marital Status: Divorced    Spouse Name: N/A    Number of Children: N/A  . Years of Education: N/A   Social History Main Topics  . Smoking status: Never Smoker   . Smokeless tobacco: None  . Alcohol Use: No  . Drug Use: No  . Sexually Active: None   Other Topics Concern  . None   Social History Narrative   Works at [employer] par time. Single, lives by self.   Review of Systems:   General: has chills. Skin: no rash HEENT: no blurry vision, hearing changes or sore throat Pulm: no dyspnea, has coughing.  CV: has chest  Wall pain. No palpitations and shortness of breath Abd: no nausea/vomiting, abdominal pain, diarrhea/constipation GU: no dysuria, hematuria, polyuria Ext: no arthralgias, myalgias Neuro: no weakness, numbness, or tingling  Objective:  Physical Exam: Filed Vitals:   11/05/11 1340  BP: 126/73  Pulse: 72  Temp: 97.5 F (36.4 C)  TempSrc: Oral  Height: 5\' 1"  (1.549 m)  Weight: 221 lb (100.245 kg)  SpO2: 100%    General: resting in bed, not in acute distress HEENT: PERRL, EOMI, no scleral icterus. Ear has no discharge. Tender over left maxillary sinus area.  Cardiac: S1/S2, RRR, No murmurs, gallops or rubs Pulm: Good air movement bilaterally, Clear to auscultation bilaterally, No rales, wheezing, rhonchi or rubs. Abd: Soft,  nondistended, nontender, no rebound pain, no organomegaly, BS present Ext: No rashes or edema, 2+DP/PT pulse bilaterally Neuro: alert and oriented X3, cranial nerves II-XII grossly intact, muscle strength 5/5 in all extremeties,  sensation to light touch intact.    Assessment & Plan:   # Sinusitis: patient's symptoms are most likely  caused by sinusitis. Since it has been going on for about 2 weeks. She has tenderness over left maxillary sinus area. She also has thick yellow colored sputum. It is likely caused by bacterial infection. Will treat her with Augmentin for 7 days and also Rondec-DM for her cough.   #. DM-II: patient's A1C 8.6. Her CBG is 100% in targeting range. Will continue the current regimen and follow up. Patient is advised to measure her CBG more frequently and regularly from now, so we can adjust her insulin dose next time.  #. HLD: her LDL was 79 on 10/10/11. Will continue her current regimen.   #. HTN: her bp is well controlled. Her Bp is 126/73 in this visit. Will continue her current regimen.   Lorretta Harp

## 2011-11-10 ENCOUNTER — Other Ambulatory Visit: Payer: Self-pay | Admitting: Internal Medicine

## 2011-11-10 DIAGNOSIS — J329 Chronic sinusitis, unspecified: Secondary | ICD-10-CM

## 2011-11-10 DIAGNOSIS — R05 Cough: Secondary | ICD-10-CM

## 2011-11-10 MED ORDER — PSEUDOEPH-BROMPHEN-DM 30-2-10 MG/5ML PO SYRP: 2.5000 mL | ORAL_SOLUTION | Freq: Four times a day (QID) | ORAL | Status: AC | PRN

## 2011-11-15 ENCOUNTER — Encounter: Payer: Self-pay | Admitting: Internal Medicine

## 2011-11-19 ENCOUNTER — Ambulatory Visit (INDEPENDENT_AMBULATORY_CARE_PROVIDER_SITE_OTHER): Payer: Medicaid Other | Admitting: Cardiology

## 2011-11-19 ENCOUNTER — Encounter: Payer: Self-pay | Admitting: Cardiology

## 2011-11-19 DIAGNOSIS — R079 Chest pain, unspecified: Secondary | ICD-10-CM

## 2011-11-19 DIAGNOSIS — E669 Obesity, unspecified: Secondary | ICD-10-CM

## 2011-11-19 DIAGNOSIS — I10 Essential (primary) hypertension: Secondary | ICD-10-CM

## 2011-11-19 NOTE — Assessment & Plan Note (Signed)
Her blood pressure is upper limits of normal. I have advised weight loss and salt restriction with increased activity. She will remain on the other meds as listed.

## 2011-11-19 NOTE — Assessment & Plan Note (Signed)
This is clearly her most significant problem. She understands the need for calorie restriction.

## 2011-11-19 NOTE — Patient Instructions (Signed)
The current medical regimen is effective;  continue present plan and medications.  Follow up as needed 

## 2011-11-19 NOTE — Assessment & Plan Note (Signed)
I reviewed with her the stress profusion results. This is a low risk scan. She has some stable exertional chest pain. At this point she needs risk reduction but no further cardiovascular testing.

## 2011-11-19 NOTE — Progress Notes (Signed)
HPI The patient was admitted recently for evaluation of chest discomfort. She ruled out for myocardial infarction. Since I last saw her she did have a stress perfusion study. There was some vague suggestion of lateral wall ischemia though it was felt that this was likely artifact. I brought her back to discuss this.  Since last saw her she will occasionally get chest pain walking up an incline. She does occasionally take a nitroglycerin for this. She is doing and the pain goes away. She denies any resting discomfort. This has been a very stable pattern of discomfort. She does have some palpitations but no presyncope or syncope. She has no new shortness of breath, PND or orthopnea. He does have occasional weight gain and swelling when she eats out. She thinks she gets extra salt and  Allergies  Allergen Reactions  . Diphenhydramine Hcl     REACTION: tachycardia  . Lisinopril Other (See Comments)    Cough secondary to ACE  . Oxycodone-Acetaminophen     REACTION: heart races    Current Outpatient Prescriptions  Medication Sig Dispense Refill  . amLODipine (NORVASC) 10 MG tablet Take 1 tablet (10 mg total) by mouth daily.  90 tablet  3  . aspirin EC 81 MG EC tablet Take 1 tablet (81 mg total) by mouth daily.      Marland Kitchen atorvastatin (LIPITOR) 80 MG tablet Take 1 tablet (80 mg total) by mouth daily.  30 tablet  11  . Bilberry, Vaccinium myrtillus, (BILBERRY PO) Take 1,000 mg by mouth daily.        Marland Kitchen BLACK COHOSH PO Take by mouth daily.      . Blood Glucose Monitoring Suppl (WAVESENSE PRESTO) W/DEVICE KIT 1 each by Does not apply route 4 (four) times daily -  before meals and at bedtime.  100 each  11  . brompheniramine-pseudoephedrine-DM 30-2-10 MG/5ML syrup Take 2.5 mLs by mouth 4 (four) times daily as needed.  120 mL  2  . calcium & magnesium carbonates (MYLANTA) 311-232 MG per tablet Take 1 tablet by mouth daily.      . Cholecalciferol (VITAMIN D3) 400 UNITS CAPS Take 400 Int'l Units by mouth  at bedtime.        . cloNIDine (CATAPRES) 0.2 MG tablet Take 1 tablet (0.2 mg total) by mouth 2 (two) times daily.  60 tablet  11  . co-enzyme Q-10 50 MG capsule Take 50 mg by mouth daily.      . cyclobenzaprine (FLEXERIL) 5 MG tablet Take 1 tablet (5 mg total) by mouth every 8 (eight) hours as needed for muscle spasms.  60 tablet  1  . gabapentin (NEURONTIN) 300 MG capsule Take 1 capsule (300 mg total) by mouth 2 (two) times daily.  60 capsule  3  . glucose blood test strip wavesense presto- use as instructed to check blood sugar 3x daily before meals & bedtime  100 each  11  . ibuprofen (ADVIL,MOTRIN) 800 MG tablet Take 800 mg by mouth every 6 (six) hours as needed. pain       . insulin NPH-insulin regular (NOVOLIN 70/30) (70-30) 100 UNIT/ML injection 35 units SQ in morning before breakfast 25 units SQ in evening before supper  10 mL  11  . Insulin Syringe-Needle U-100 (INSULIN SYRINGE .5CC/28G) 28G X 1/2" 0.5 ML MISC as directed.        . irbesartan (AVAPRO) 150 MG tablet Take 1 tablet (150 mg total) by mouth daily.  90 tablet  3  .  IRON PO Take 27 mg by mouth daily.      . isosorbide mononitrate (IMDUR) 30 MG 24 hr tablet Take 1 tablet (30 mg total) by mouth daily.  30 tablet  6  . Lancets 30G MISC Use to check blood sugar twice a day       . loratadine (CLARITIN) 10 MG tablet Take 10 mg by mouth 2 (two) times daily as needed. For allergies       . metFORMIN (GLUCOPHAGE) 1000 MG tablet Take 1 tablet (1,000 mg total) by mouth 2 (two) times daily with a meal.  60 tablet  11  . metoprolol succinate (TOPROL-XL) 100 MG 24 hr tablet Take 1 tablet (100 mg total) by mouth daily.  30 tablet  11  . nitroGLYCERIN (NITROSTAT) 0.4 MG SL tablet Place 1 tablet (0.4 mg total) under the tongue every 5 (five) minutes as needed for chest pain.  25 tablet  1  . potassium chloride SA (K-DUR,KLOR-CON) 20 MEQ tablet Take 2 tablets (40 mEq total) by mouth daily.  60 tablet  3  . Red Yeast Rice 600 MG TABS Take by  mouth daily.      . Thiamine HCl (VITAMIN B-1) 250 MG tablet Take 250 mg by mouth daily.      . vitamin B-12 (CYANOCOBALAMIN) 500 MCG tablet Take 500 mcg by mouth daily.        Marland Kitchen DISCONTD: gabapentin (NEURONTIN) 300 MG capsule Take 1 capsule (300 mg total) by mouth 2 (two) times daily.  90 capsule  3    Past Medical History  Diagnosis Date  . Chest pain 11/07    Dr Jens Som cath : LAD luminal irregularities, ramus intermediate with superior branch 70% stenosis, circumflex 50% stenosis, marginal 50% stenosis, well preserved ejection fraction.  . Diabetes mellitus type II     Insulin dependent. Has worked with Lupita Leash R previously.  Marland Kitchen Hypertension     Requires 5 drug therapy and still difficult to control.  Marland Kitchen GERD (gastroesophageal reflux disease)   . Hyperlipidemia     On daily statin  . Obesity     Morbid. HAs worked with Lupita Leash T previously.  . Colonic polyp 1995    Colonoscopies 1994, 2000, and 02/02/2011. Last had 9 polyps - Tubular adenoma and tubulovillous was the pathology results without high grade dysplasia  . OSA (obstructive sleep apnea) 02/16/2007    Moderate AHI 35.6, O2 sat decreased to 65%, CPAP titration to 16 with AHI of 3.6.  Marland Kitchen Chronic pain     MR 08/07/10 :  Spondylosis L4-5 with B foraminal narrowing and L4 nerve root enchroachment B.  . Seasonal allergies     seasonal  . 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 S/p lumpectomy and radiation.  . Iron deficiency anemia     Ferritin was 12 in 2012. on FeSo4. Has had colon and EGD 02/02/11 that showed mild gastritis and colon polyps.  . Chronic venous insufficiency 2012    Has had full W/U incl ECHO, LFT's, Cr, Umicro, TSH, BNP. Symptomatic treatment.    Past Surgical History  Procedure Date  . Mastectomy 2006    For breast CA  . Colonoscopy w/ polypectomy 1994, 2000, 2012  . Total abdominal hysterectomy 1985  . Carotid dopplers 08/24/2007    Normal. Done 2/2 dizziness.  .  Cardiac catheterization 2007    Non obstructing CAD - [place]    ROS: As stated in the  HPI and negative for all other systems.   PHYSICAL EXAM BP 140/80  Pulse 72  Ht 5\' 1"  (1.549 m)  Wt 224 lb (101.606 kg)  BMI 42.32 kg/m2  LMP 07/12/1984 GENERAL:  Well appearing NECK:  No jugular venous distention, waveform within normal limits, carotid upstroke brisk and symmetric, no bruits, no thyromegaly LYMPHATICS:  No cervical, inguinal adenopathy LUNGS:  Clear to auscultation bilaterally CHEST:  Unremarkable HEART:  PMI not displaced or sustained,S1 and S2 within normal limits, no S3, no S4, no clicks, no rubs, no murmurs ABD:  Flat, positive bowel sounds normal in frequency in pitch, no bruits, no rebound, no guarding, no midline pulsatile mass, no hepatomegaly, no splenomegaly, obese EXT:  2 plus pulses throughout, diffuse upper and lower mild extremity edema, no cyanosis no clubbing  ASSESSMENT AND PLAN

## 2011-11-23 ENCOUNTER — Other Ambulatory Visit: Payer: Self-pay | Admitting: Internal Medicine

## 2011-11-29 ENCOUNTER — Encounter: Payer: Self-pay | Admitting: Internal Medicine

## 2011-11-30 ENCOUNTER — Encounter: Payer: Self-pay | Admitting: Internal Medicine

## 2011-11-30 ENCOUNTER — Ambulatory Visit: Payer: Medicaid Other | Admitting: Dietician

## 2011-11-30 ENCOUNTER — Ambulatory Visit: Payer: Medicaid Other | Admitting: Internal Medicine

## 2011-12-07 ENCOUNTER — Encounter: Payer: Self-pay | Admitting: Internal Medicine

## 2011-12-07 ENCOUNTER — Ambulatory Visit (INDEPENDENT_AMBULATORY_CARE_PROVIDER_SITE_OTHER): Payer: Medicaid Other | Admitting: Internal Medicine

## 2011-12-07 VITALS — BP 119/70 | HR 69 | Temp 97.7°F | Ht 61.0 in | Wt 223.1 lb

## 2011-12-07 DIAGNOSIS — E119 Type 2 diabetes mellitus without complications: Secondary | ICD-10-CM

## 2011-12-07 DIAGNOSIS — E669 Obesity, unspecified: Secondary | ICD-10-CM

## 2011-12-07 DIAGNOSIS — I2510 Atherosclerotic heart disease of native coronary artery without angina pectoris: Secondary | ICD-10-CM

## 2011-12-07 DIAGNOSIS — G4700 Insomnia, unspecified: Secondary | ICD-10-CM

## 2011-12-07 DIAGNOSIS — Z0000 Encounter for general adult medical examination without abnormal findings: Secondary | ICD-10-CM

## 2011-12-07 DIAGNOSIS — E785 Hyperlipidemia, unspecified: Secondary | ICD-10-CM

## 2011-12-07 DIAGNOSIS — I872 Venous insufficiency (chronic) (peripheral): Secondary | ICD-10-CM

## 2011-12-07 DIAGNOSIS — G609 Hereditary and idiopathic neuropathy, unspecified: Secondary | ICD-10-CM

## 2011-12-07 DIAGNOSIS — I10 Essential (primary) hypertension: Secondary | ICD-10-CM

## 2011-12-07 DIAGNOSIS — G629 Polyneuropathy, unspecified: Secondary | ICD-10-CM

## 2011-12-07 DIAGNOSIS — R0789 Other chest pain: Secondary | ICD-10-CM

## 2011-12-07 LAB — GLUCOSE, CAPILLARY

## 2011-12-07 MED ORDER — LORAZEPAM 1 MG PO TABS: 1.0000 mg | ORAL_TABLET | Freq: Every evening | ORAL | Status: DC | PRN

## 2011-12-07 MED ORDER — OMEPRAZOLE 40 MG PO CPDR: 40.0000 mg | DELAYED_RELEASE_CAPSULE | Freq: Every day | ORAL | Status: DC

## 2011-12-07 NOTE — Progress Notes (Signed)
Lorazepam 1mg  rx called to [facility] on [address].

## 2011-12-07 NOTE — Assessment & Plan Note (Signed)
Continues to have pitting edema +1 B. Symptomatic treatment.

## 2011-12-07 NOTE — Assessment & Plan Note (Signed)
She tells me she is exercising daily with walking although it is more challenging with the cold weather to do this. Weight really isn't budging though. She is really motivated to change her lifestyle.

## 2011-12-07 NOTE — Assessment & Plan Note (Signed)
LDL is great! Cont Atorvastatin.

## 2011-12-07 NOTE — Assessment & Plan Note (Signed)
On gabapentin. Not causing severe pain.

## 2011-12-07 NOTE — Assessment & Plan Note (Signed)
Great control. Cont meds.

## 2011-12-07 NOTE — Progress Notes (Signed)
  Subjective:    Patient ID: [name], female    DOB: 1951-03-15, 61 y.o.   MRN: [id number]  HPI  Please see the A&P for the status of the pt's chronic medical problems.   Review of Systems  Constitutional: Negative for activity change, appetite change and unexpected weight change.  HENT: Positive for congestion.   Respiratory: Positive for shortness of breath.   Cardiovascular: Positive for chest pain and leg swelling.  Gastrointestinal: Negative for vomiting.  Neurological: Negative for dizziness.  Psychiatric/Behavioral: Positive for sleep disturbance. The patient is not nervous/anxious.        Objective:   Physical Exam  Constitutional: She is oriented to person, place, and time. She appears well-developed and well-nourished. No distress.  HENT:  Head: Normocephalic and atraumatic.  Right Ear: External ear normal.  Left Ear: External ear normal.  Nose: Nose normal.  Eyes: Conjunctivae are normal.  Musculoskeletal: Normal range of motion. She exhibits edema. She exhibits no tenderness.  Neurological: She is alert and oriented to person, place, and time.  Skin: Skin is warm and dry. She is not diaphoretic. No erythema.  Psychiatric: She has a normal mood and affect. Her behavior is normal. Judgment and thought content normal.          Assessment & Plan:

## 2011-12-07 NOTE — Patient Instructions (Signed)
Try the Ativan for sleep. Be careful getting out of bed as it can cause you to be unsteady. Try the omeprazole one pill every day to see if it decreases your chest pain. Try for 4-6 weeks.   Take 38 units insulin in the AM and 28 units in the PM.  If you are still high, please call me in one week.

## 2011-12-07 NOTE — Assessment & Plan Note (Addendum)
Lab Results  Component Value Date   HGBA1C 8.6* 10/09/2011  She is quite upset that her DM is not better controlled. She is walking everyday. She is eating well but lists a lot of starches (potatoes). She has worked with Lupita Leash P previously. She is on max metformin and on 70/30. She likes her insulin - used to be on Lantus but had lows. She has no lows on 70/30. All CBG's seem to be high. I tried to explain natural course of DM and may not be her but just natural course of DM. Eats regularly and takes insulin at right time. Will increase 70/30 from 35 AM and 25 PM to 38 and 28. She will call me in one week if CBG's still high. She may get better control with basal and prandial insulin but since she likes her 70/30 will try it first.   She refuses flu vaccine. Had pneumovax. Had dilated eye exam. To see retinal specialist. On ACE. I filled out form for DM shoes - meet neuropathy and foot deformity (high arches). Had the insoles, got old, now without insoles she can feel the difference.

## 2011-12-07 NOTE — Assessment & Plan Note (Addendum)
Refuses flu. Had PNA. Refuses shingles vaccine bc already had it when was 6 and she did describe unilateral band rash on ABD wall. Grandmother put blue ink on it. Took to doctor who said got her there in time bc if encircled ABD it would have killed her.  She C/O insomnia today. Goes to bed 9 PM and tosses and turns. Gets OOB and drinks tea or hot milk. Falls asleep 4 or 5 AM. Wakes up 6-8 AM. Is tired. No naps. Tried something in past for two weeks and got her sleep straightened out. Not interested in Ambien. Will try Ativan 1 mg QHS PRN. Can increase to 2 mg if needed.

## 2011-12-07 NOTE — Assessment & Plan Note (Signed)
She has non cardiac CP. She sees Dr Rexene Edison regularly and just saw him in Feb. She is on great CAD meds. Still gets "angina" substernal chest pressure, R shoulder & arm pain, sweaty & clammy feeling. NTG only partially helps. Has to stop walking and rest but also occurs while sitting and at night. Has tried PRN H2B and malox. She has not tried a daily preventative PPI and I have convinced her to at least try for 4-6 weeks. She denies sxs of GERD but PPI is first line tx for non cardiac CP. If no better in 4-6 weeks she can D/C the PPI.

## 2011-12-22 ENCOUNTER — Encounter (INDEPENDENT_AMBULATORY_CARE_PROVIDER_SITE_OTHER): Payer: Medicaid Other | Admitting: Ophthalmology

## 2011-12-22 DIAGNOSIS — H35039 Hypertensive retinopathy, unspecified eye: Secondary | ICD-10-CM

## 2011-12-22 DIAGNOSIS — E1165 Type 2 diabetes mellitus with hyperglycemia: Secondary | ICD-10-CM

## 2011-12-22 DIAGNOSIS — E11319 Type 2 diabetes mellitus with unspecified diabetic retinopathy without macular edema: Secondary | ICD-10-CM

## 2011-12-22 DIAGNOSIS — H2510 Age-related nuclear cataract, unspecified eye: Secondary | ICD-10-CM

## 2011-12-22 DIAGNOSIS — H43819 Vitreous degeneration, unspecified eye: Secondary | ICD-10-CM

## 2011-12-22 DIAGNOSIS — I10 Essential (primary) hypertension: Secondary | ICD-10-CM

## 2011-12-28 ENCOUNTER — Encounter: Payer: Self-pay | Admitting: Gastroenterology

## 2012-01-11 ENCOUNTER — Ambulatory Visit (INDEPENDENT_AMBULATORY_CARE_PROVIDER_SITE_OTHER): Payer: Medicaid Other | Admitting: Internal Medicine

## 2012-01-11 ENCOUNTER — Encounter: Payer: Self-pay | Admitting: Internal Medicine

## 2012-01-11 VITALS — BP 113/65 | HR 71 | Temp 97.0°F | Wt 221.6 lb

## 2012-01-11 DIAGNOSIS — I10 Essential (primary) hypertension: Secondary | ICD-10-CM

## 2012-01-11 DIAGNOSIS — Z0000 Encounter for general adult medical examination without abnormal findings: Secondary | ICD-10-CM

## 2012-01-11 DIAGNOSIS — Z79899 Other long term (current) drug therapy: Secondary | ICD-10-CM

## 2012-01-11 DIAGNOSIS — C50919 Malignant neoplasm of unspecified site of unspecified female breast: Secondary | ICD-10-CM

## 2012-01-11 DIAGNOSIS — E785 Hyperlipidemia, unspecified: Secondary | ICD-10-CM

## 2012-01-11 DIAGNOSIS — J302 Other seasonal allergic rhinitis: Secondary | ICD-10-CM

## 2012-01-11 DIAGNOSIS — E669 Obesity, unspecified: Secondary | ICD-10-CM

## 2012-01-11 DIAGNOSIS — K635 Polyp of colon: Secondary | ICD-10-CM

## 2012-01-11 DIAGNOSIS — I2510 Atherosclerotic heart disease of native coronary artery without angina pectoris: Secondary | ICD-10-CM

## 2012-01-11 DIAGNOSIS — J309 Allergic rhinitis, unspecified: Secondary | ICD-10-CM

## 2012-01-11 DIAGNOSIS — I872 Venous insufficiency (chronic) (peripheral): Secondary | ICD-10-CM

## 2012-01-11 DIAGNOSIS — D126 Benign neoplasm of colon, unspecified: Secondary | ICD-10-CM

## 2012-01-11 DIAGNOSIS — E119 Type 2 diabetes mellitus without complications: Secondary | ICD-10-CM

## 2012-01-11 DIAGNOSIS — G8929 Other chronic pain: Secondary | ICD-10-CM

## 2012-01-11 LAB — POCT GLYCOSYLATED HEMOGLOBIN (HGB A1C): Hemoglobin A1C: 7.7

## 2012-01-11 LAB — GLUCOSE, CAPILLARY: Glucose-Capillary: 153 mg/dL — ABNORMAL HIGH (ref 70–99)

## 2012-01-11 MED ORDER — INSULIN NPH ISOPHANE & REGULAR (70-30) 100 UNIT/ML ~~LOC~~ SUSP: SUBCUTANEOUS | Status: DC

## 2012-01-11 MED ORDER — METFORMIN HCL 1000 MG PO TABS: 1000.0000 mg | ORAL_TABLET | Freq: Two times a day (BID) | ORAL | Status: DC

## 2012-01-11 NOTE — Assessment & Plan Note (Signed)
BP Readings from Last 3 Encounters:  01/11/12 113/65  12/07/11 119/70  11/19/11 140/80   She has been out of Avapro for one month. She has increased her BB to compensate. BP and HR OK. I will work on avapro pre-auth. She cannot tolerate a ACEI 2/2 cough.

## 2012-01-11 NOTE — Assessment & Plan Note (Signed)
I reviewed her ECHO - mild LV dysfxn - could cause bit but most likely 2/2 CVI.

## 2012-01-11 NOTE — Assessment & Plan Note (Signed)
She got a notice that her colon was due. I encouraged her to get her heart straightened out first then procede with the colon.

## 2012-01-11 NOTE — Assessment & Plan Note (Signed)
An eye MD had given her patanol but it hurts her eyes. I told her she could stop it.

## 2012-01-11 NOTE — Assessment & Plan Note (Signed)
I entered varicella refusal as I had documented that visit.

## 2012-01-11 NOTE — Patient Instructions (Signed)
See Dr Antoine Poche on [address] I gave you prescriptions for metformin and insulin  If your chest pain does not get better with thee NTG, please call 911 Your A1C has come down one full point. Go back to normal dose of metoprolol I will work with medicaid to get you avapro

## 2012-01-11 NOTE — Assessment & Plan Note (Addendum)
This occupied most of our time. She has daily "angina" attacks. These come on at rest, with exertion, during sleep. They are substernal pressure, go to L shoulder & arm, assoc with sweaty & clammy feeling, weakness, shaking, smothering feeling, nausea. She has been on a PPI since our last visit with no relief. NTG only partially helps. It is hard to pin her down on how long they last and whether NTG does anything. It sound like NTG does help but only partially bc the pain comes back. She does get a HA with the NTG. She requested SQ NTG patch but I did not Rx as it has not been established that these are anginal in nature. Dr Rexene Edison felt that they were not anginal at at his last visit. And she is already on a BB, CCB, and imdur all with antianginal properties. I have set her up with Dr [facility] office for an additional visit to ensure that these are not cardiac in origin, before I start other W/U.   Since she had XRT to her L breast, I am repeating her ECHO since it was last done in 2010. XRT seems to increase cardiac morbidity and mortality.   Dr Rexene Edison encouraged RF mgmt last visit - her DM is under better control, her BP is controlled, she does not smoke, and her cholesterol is controlled. Her weight however is not controlled and her BMI is 42.

## 2012-01-11 NOTE — Assessment & Plan Note (Signed)
She increased her insulin last visit and has noticed that her CBG's have decreased, There are only a few readings but the AM decreased from 168 to 102. Post breakfast decreased from 161 to 73. Post lunch remained elevated at 187. Her A1C decreased from 8.6 to 7.7. She has been out of her metformin for one month bc of refill hassles so that A1C is higher than it would have been had she been on her metformin. I gave her a refill on metformin and insulin. Cont doses as is. UTD on all DM HM.

## 2012-01-11 NOTE — Assessment & Plan Note (Signed)
BMI is 42. She is afraid to walk bc of the CP and causing more damage.

## 2012-01-11 NOTE — Assessment & Plan Note (Signed)
Well controlled on her statin.

## 2012-01-11 NOTE — Progress Notes (Signed)
  Subjective:    Patient ID: [name], female    DOB: 21-May-1951, 61 y.o.   MRN: [id number]  HPI  Please see the A&P for the status of the pt's chronic medical problems.   Review of Systems  Constitutional: Positive for fatigue. Negative for activity change, appetite change and unexpected weight change.  Respiratory: Positive for chest tightness and shortness of breath.   Cardiovascular: Positive for chest pain and leg swelling.  Musculoskeletal: Positive for myalgias and arthralgias.       L shoulder and upper arm hurts when CP occurs  Neurological: Positive for dizziness, weakness, light-headedness and headaches.  Psychiatric/Behavioral: Positive for sleep disturbance. The patient is not nervous/anxious.        Objective:   Physical Exam  Constitutional: She is oriented to person, place, and time. She appears well-developed and well-nourished. No distress.  HENT:  Head: Normocephalic and atraumatic.  Right Ear: External ear normal.  Left Ear: External ear normal.  Nose: Nose normal.  Eyes: Conjunctivae are normal.  Neck: Neck supple. No tracheal deviation present. No thyromegaly present.  Cardiovascular: Normal rate, regular rhythm and normal heart sounds.   No murmur heard. Pulmonary/Chest: Effort normal and breath sounds normal.  Musculoskeletal: Normal range of motion. She exhibits edema and tenderness.  Lymphadenopathy:    She has no cervical adenopathy.  Neurological: She is alert and oriented to person, place, and time.  Skin: Skin is warm and dry. She is not diaphoretic.       Acanthosis nigricans neck  Psychiatric: She has a normal mood and affect. Her behavior is normal. Judgment and thought content normal.          Assessment & Plan:

## 2012-01-11 NOTE — Assessment & Plan Note (Signed)
UTD on MMG.

## 2012-01-11 NOTE — Assessment & Plan Note (Signed)
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

## 2012-01-12 ENCOUNTER — Ambulatory Visit (HOSPITAL_COMMUNITY)
Admission: RE | Admit: 2012-01-12 | Discharge: 2012-01-12 | Disposition: A | Discharge status: Home / Self Care | Payer: Medicaid Other | Source: Ambulatory Visit | Attending: Internal Medicine | Admitting: Internal Medicine

## 2012-01-12 DIAGNOSIS — Z853 Personal history of malignant neoplasm of breast: Secondary | ICD-10-CM | POA: Insufficient documentation

## 2012-01-12 DIAGNOSIS — R079 Chest pain, unspecified: Secondary | ICD-10-CM

## 2012-01-12 DIAGNOSIS — I10 Essential (primary) hypertension: Secondary | ICD-10-CM | POA: Insufficient documentation

## 2012-01-12 DIAGNOSIS — I2510 Atherosclerotic heart disease of native coronary artery without angina pectoris: Secondary | ICD-10-CM

## 2012-01-12 DIAGNOSIS — I872 Venous insufficiency (chronic) (peripheral): Secondary | ICD-10-CM

## 2012-01-12 DIAGNOSIS — D649 Anemia, unspecified: Secondary | ICD-10-CM | POA: Insufficient documentation

## 2012-01-12 DIAGNOSIS — E785 Hyperlipidemia, unspecified: Secondary | ICD-10-CM | POA: Insufficient documentation

## 2012-01-12 DIAGNOSIS — E119 Type 2 diabetes mellitus without complications: Secondary | ICD-10-CM

## 2012-01-12 NOTE — Progress Notes (Signed)
*  PRELIMINARY RESULTS* Echocardiogram 2D Echocardiogram has been performed.  [name] 01/12/2012, 11:56 AM

## 2012-01-13 ENCOUNTER — Ambulatory Visit (INDEPENDENT_AMBULATORY_CARE_PROVIDER_SITE_OTHER): Payer: Medicaid Other | Admitting: Nurse Practitioner

## 2012-01-13 ENCOUNTER — Encounter: Payer: Self-pay | Admitting: Nurse Practitioner

## 2012-01-13 VITALS — BP 118/88 | HR 70 | Ht 61.0 in | Wt 220.0 lb

## 2012-01-13 DIAGNOSIS — E785 Hyperlipidemia, unspecified: Secondary | ICD-10-CM

## 2012-01-13 DIAGNOSIS — I209 Angina pectoris, unspecified: Secondary | ICD-10-CM

## 2012-01-13 DIAGNOSIS — I509 Heart failure, unspecified: Secondary | ICD-10-CM

## 2012-01-13 DIAGNOSIS — I5032 Chronic diastolic (congestive) heart failure: Secondary | ICD-10-CM

## 2012-01-13 DIAGNOSIS — I10 Essential (primary) hypertension: Secondary | ICD-10-CM

## 2012-01-13 DIAGNOSIS — I2510 Atherosclerotic heart disease of native coronary artery without angina pectoris: Secondary | ICD-10-CM

## 2012-01-13 MED ORDER — NITROGLYCERIN 0.4 MG SL SUBL: 0.4000 mg | SUBLINGUAL_TABLET | SUBLINGUAL | Status: DC | PRN

## 2012-01-13 MED ORDER — NITROGLYCERIN 0.4 MG/HR TD PT24: 1.0000 | MEDICATED_PATCH | Freq: Every day | TRANSDERMAL | Status: DC

## 2012-01-13 NOTE — Patient Instructions (Signed)
Your physician recommends that you schedule a follow-up appointment in: 1 month with Dr Antoine Poche Your physician has recommended you make the following change in your medication: STOP Isosorbide and START Nitro patch 0.4 mg change patch daily

## 2012-01-13 NOTE — Progress Notes (Signed)
Patient Name: [name] Date of Encounter: 01/13/2012  Primary Care Provider:  Blanch Media, MD, MD Primary Cardiologist:  Shela Commons. Hochrein, MD  Patient Profile  61 year old female who with history of chest pain and nonobstructive CAD who presents with ongoing chest pain.  Problem List   Past Medical History  Diagnosis Date  . Chest pain 11/07    cath 11/07 : LAD luminal irregularities, ramus intermediate with superior branch 70% stenosis, circumflex 50% stenosis, marginal 50% stenosis, well preserved ejection fraction. Stress test Jan 2013 : suggestion of lateral wall ischemia, likely artifact  . Diabetes mellitus type II     Insulin dependent. Has worked with Lupita Leash R previously.  Marland Kitchen Hypertension     Requires 5 drug therapy and still difficult to control.  Marland Kitchen GERD (gastroesophageal reflux disease)   . Hyperlipidemia     On daily statin  . Obesity     Morbid. HAs worked with Lupita Leash T previously.  . Colonic polyp 1995    Colonoscopies 1994, 2000, and 02/02/2011. Last had 9 polyps - Tubular adenoma and tubulovillous was the pathology results without high grade dysplasia  . OSA (obstructive sleep apnea) 02/16/2007    Moderate AHI 35.6, O2 sat decreased to 65%, CPAP titration to 16 with AHI of 3.6.  Marland Kitchen Chronic pain     MR 08/07/10 :  Spondylosis L4-5 with B foraminal narrowing and L4 nerve root enchroachment B.  . Seasonal allergies     seasonal  . 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 S/p lumpectomy and radiation.  . Iron deficiency anemia     Ferritin was 12 in 2012. on FeSo4. Has had colon and EGD 02/02/11 that showed mild gastritis and colon polyps.  . Chronic venous insufficiency 2012    Has had full W/U incl ECHO, LFT's, Cr, Umicro, TSH, BNP. Symptomatic treatment.  . Diastolic CHF, chronic     NL EF by echo 01/2012, Gr I dd   Past Surgical History  Procedure Date  . Mastectomy 2006    For breast CA  . Colonoscopy w/  polypectomy 1994, 2000, 2012  . Total abdominal hysterectomy 1985  . Carotid dopplers 08/24/2007    Normal. Done 2/2 dizziness.  . Cardiac catheterization 2007    Non obstructing CAD - [facility]    Allergies  Allergies  Allergen Reactions  . Diphenhydramine Hcl     REACTION: tachycardia  . Lisinopril Other (See Comments)    Cough secondary to ACE  . Oxycodone-Acetaminophen     REACTION: heart races    HPI  67-year-old female with above problem list.  She has been experiencing intermittent chest pressure with associated dyspnea and sometimes left arm pain ever since 2007.  This occurs several times per week.  She had a catheterization 2007 which showed nonobstructive CAD and more recently underwent Myoview testing which showed no significant evidence of ischemia.  She is on Imdur therapy along with beta blocker and p.r.n. Nitroglycerin.  She continues to have chest pain multiple days of the week occurring with rest, exertion and also nocturnally.  Symptoms are often accompanied by dyspnea, diaphoresis, weakness, and occasionally left shoulder pain.  She will often take one to 2 nitroglycerin tablet under tongue and symptoms eased off over about a 30 minute period.  She is convinced that this is cardiac angina.  She feels that the Imdur may not be working and is also causing headaches and has requested a switch  to nitroglycerin patch.  Home Medications  Prior to Admission medications   Medication Sig Start Date End Date Taking? Authorizing Provider  amLODipine (NORVASC) 10 MG tablet Take 1 tablet (10 mg total) by mouth daily. 10/18/11  Yes Daryel Gerald, MD  aspirin EC 81 MG EC tablet Take 1 tablet (81 mg total) by mouth daily. 10/10/11 10/09/12 Yes Rande Brunt, PA  atorvastatin (LIPITOR) 80 MG tablet Take 1 tablet (80 mg total) by mouth daily. 10/18/11  Yes Daryel Gerald, MD  Bilberry, Vaccinium myrtillus, (BILBERRY PO) Take 1,000 mg by mouth daily.     Yes  Historical Provider, MD  BLACK COHOSH PO Take by mouth daily.   Yes Historical Provider, MD  Blood Glucose Monitoring Suppl (WAVESENSE PRESTO) W/DEVICE KIT 1 each by Does not apply route 4 (four) times daily -  before meals and at bedtime. 04/28/11  Yes Burns Spain, MD  calcium & magnesium carbonates (MYLANTA) 161-096 MG per tablet Take 1 tablet by mouth daily.   Yes Historical Provider, MD  Cholecalciferol (VITAMIN D3) 400 UNITS CAPS Take 400 Int'l Units by mouth at bedtime.     Yes Historical Provider, MD  cloNIDine (CATAPRES) 0.2 MG tablet Take 1 tablet (0.2 mg total) by mouth 2 (two) times daily. 10/18/11 10/18/12 Yes Daryel Gerald, MD  co-enzyme Q-10 50 MG capsule Take 50 mg by mouth daily.   Yes Historical Provider, MD  cyclobenzaprine (FLEXERIL) 5 MG tablet Take 1 tablet (5 mg total) by mouth every 8 (eight) hours as needed for muscle spasms. 10/18/11 10/17/12 Yes Daryel Gerald, MD  gabapentin (NEURONTIN) 300 MG capsule Take 1 capsule (300 mg total) by mouth 2 (two) times daily. 10/18/11  Yes Daryel Gerald, MD  glucose blood test strip wavesense presto- use as instructed to check blood sugar 3x daily before meals & bedtime 04/28/11  Yes Burns Spain, MD  ibuprofen (ADVIL,MOTRIN) 800 MG tablet Take 800 mg by mouth every 6 (six) hours as needed. pain    Yes Historical Provider, MD  insulin NPH-insulin regular (NOVOLIN 70/30) (70-30) 100 UNIT/ML injection 38 units SQ in morning before breakfast 28 units SQ in evening before supper 01/11/12  Yes Burns Spain, MD  Insulin Syringe-Needle U-100 (INSULIN SYRINGE .5CC/28G) 28G X 1/2" 0.5 ML MISC as directed.     Yes Historical Provider, MD  IRON PO Take 27 mg by mouth daily.   Yes Historical Provider, MD  Lancets 30G MISC Use to check blood sugar twice a day    Yes Historical Provider, MD  loratadine (CLARITIN) 10 MG tablet Take 10 mg by mouth 2 (two) times daily as needed. For allergies    Yes  Historical Provider, MD  metFORMIN (GLUCOPHAGE) 1000 MG tablet Take 1 tablet (1,000 mg total) by mouth 2 (two) times daily with a meal. 01/11/12 01/11/13 Yes Burns Spain, MD  metoprolol succinate (TOPROL-XL) 100 MG 24 hr tablet Take 1 tablet (100 mg total) by mouth daily. 10/18/11 10/18/12 Yes Daryel Gerald, MD  nitroGLYCERIN (NITROSTAT) 0.4 MG SL tablet Place 1 tablet (0.4 mg total) under the tongue every 5 (five) minutes as needed for chest pain. 01/13/12 01/12/13 Yes Ok Anis, NP  potassium chloride SA (K-DUR,KLOR-CON) 20 MEQ tablet Take 2 tablets (40 mEq total) by mouth daily. 10/18/11 10/18/12 Yes Daryel Gerald, MD  Red Yeast Rice 600 MG TABS Take by mouth daily.   Yes Historical Provider, MD  Tamsulosin HCl (FLOMAX) 0.4 MG  CAPS Take 1 capsule (0.4 mg total) by mouth at bedtime. 11/23/11  Yes Burns Spain, MD  Thiamine HCl (VITAMIN B-1) 250 MG tablet Take 250 mg by mouth daily.   Yes Historical Provider, MD  vitamin B-12 (CYANOCOBALAMIN) 500 MCG tablet Take 500 mcg by mouth daily.     Yes Historical Provider, MD  LORazepam (ATIVAN) 1 MG tablet Take 1 tablet (1 mg total) by mouth at bedtime as needed for anxiety. 12/07/11 12/17/11  Burns Spain, MD  nitroGLYCERIN (NITRO-DUR) 0.4 mg/hr Place 1 patch (0.4 mg total) onto the skin daily. 01/13/12 01/12/13  Ok Anis, NP   Review of Systems  Chest pain as outlined in the HPI.  No chest pain, sob, n, v, dizziness, syncope, edema, early satiety, dysuria, dark stools, blood in stools, diarrhea, rash/skin changes, fevers, chills, wt loss/gain.  Otherwise all systems reviewed and negative.  Physical Exam  Blood pressure 118/88, pulse 70, height 5\' 1"  (1.549 m), weight 220 lb (99.791 kg), last menstrual period 07/12/1984.  General: Pleasant, NAD Psych: Normal affect. Neuro: Alert and oriented X 3. Moves all extremities spontaneously. HEENT: Normal  Neck: Obese, supple without bruits or  JVD. Lungs:  Resp regular and unlabored, CTA. Heart: RRR no s3, s4, or murmurs. Abdomen: Soft, non-tender, non-distended, BS + x 4.  Extremities: No clubbing, cyanosis.  Trace bilat LEE. DP/PT/Radials 1+ and equal bilaterally.  Accessory Clinical Findings  ECG - regular sinus rhythm, first-degree AV block, nonspecific T wave abnormality.  No acute changes.  Assessment & Plan  1.  Chest pain at rest/nonobstructive CAD:  The patient continues to have intermittent chest discomfort.  Symptoms sound fairly concerning though they've been present since her last catheterization in 2007.  She recently had a low risk Myoview.  In discussing it with her, she would prefer to switch her Imdur to nitroglycerin patch to see if this helps any rather than pursue repeat diagnostic catheterization.  I gone ahead and prescribed nitroglycerin patch at 0.4 mg daily.  Will have her followup with Dr. Antoine Poche in about a month and at that time she continues to have the symptoms we will need to reconsider diagnostic catheterization.  2.  Hypertension:  Stable.  Continue multiple current medications.  3.  Obesity:  Likely contributing to patient's dyspnea as she is fairly deconditioned.  4.  Diabetes mellitus:  Managed by primary care.  5.  Disposition:  Followup in one month.    Nicolasa Ducking, NP 01/13/2012, 4:59 PM

## 2012-01-14 ENCOUNTER — Encounter: Payer: Self-pay | Admitting: Gastroenterology

## 2012-01-26 ENCOUNTER — Telehealth: Payer: Self-pay | Admitting: *Deleted

## 2012-01-26 NOTE — Telephone Encounter (Signed)
Thank you! She really needed it.

## 2012-01-26 NOTE — Telephone Encounter (Signed)
Call to [facility] for Prior Authorization for Irbesartan 150 mg tablets.  Approved 01/26/2012 thru 01/25/2013.  Angelina Ok, RN 01/26/2012 10:56 AM.

## 2012-02-14 ENCOUNTER — Encounter: Payer: Self-pay | Admitting: Cardiology

## 2012-02-14 ENCOUNTER — Ambulatory Visit (INDEPENDENT_AMBULATORY_CARE_PROVIDER_SITE_OTHER): Payer: Medicaid Other | Admitting: Cardiology

## 2012-02-14 VITALS — BP 100/60 | HR 72 | Ht 61.0 in | Wt 223.0 lb

## 2012-02-14 DIAGNOSIS — I10 Essential (primary) hypertension: Secondary | ICD-10-CM

## 2012-02-14 DIAGNOSIS — I5032 Chronic diastolic (congestive) heart failure: Secondary | ICD-10-CM

## 2012-02-14 DIAGNOSIS — I509 Heart failure, unspecified: Secondary | ICD-10-CM

## 2012-02-14 DIAGNOSIS — I2510 Atherosclerotic heart disease of native coronary artery without angina pectoris: Secondary | ICD-10-CM

## 2012-02-14 NOTE — Assessment & Plan Note (Addendum)
She seems to be euvolemic.  At this point, no change in therapy is indicated.  We have reviewed salt and fluid restrictions.  No further cardiovascular testing is indicated.   

## 2012-02-14 NOTE — Progress Notes (Signed)
H The patient was admitted recently for evaluation of chest discomfort.  At the last visit she was switched from Imdur to NTG patch.  She thinks that she's had less discomfort with this. She's only taking nitroglycerin rarely now. She's able to do her activities including walking when her back allows. With this she is not bringing on any new symptoms. She will have occasional chest pain with activities but this is not reproducible. She has no new shortness of breath, PND or orthopnea. She has continued mild lower extremity edema which is unchanged.   Allergies  Allergen Reactions  . Diphenhydramine Hcl     REACTION: tachycardia  . Lisinopril Other (See Comments)    Cough secondary to ACE  . Oxycodone-Acetaminophen     REACTION: heart races    Current Outpatient Prescriptions  Medication Sig Dispense Refill  . amLODipine (NORVASC) 10 MG tablet Take 1 tablet (10 mg total) by mouth daily.  90 tablet  3  . aspirin EC 81 MG EC tablet Take 1 tablet (81 mg total) by mouth daily.      Marland Kitchen atorvastatin (LIPITOR) 80 MG tablet Take 1 tablet (80 mg total) by mouth daily.  30 tablet  11  . Bilberry, Vaccinium myrtillus, (BILBERRY PO) Take 1,000 mg by mouth daily.        Marland Kitchen BLACK COHOSH PO Take by mouth daily.      . Blood Glucose Monitoring Suppl (WAVESENSE PRESTO) W/DEVICE KIT 1 each by Does not apply route 4 (four) times daily -  before meals and at bedtime.  100 each  11  . calcium & magnesium carbonates (MYLANTA) 311-232 MG per tablet Take 1 tablet by mouth daily.      . Cholecalciferol (VITAMIN D3) 400 UNITS CAPS Take 400 Int'l Units by mouth at bedtime.        . cloNIDine (CATAPRES) 0.2 MG tablet Take 1 tablet (0.2 mg total) by mouth 2 (two) times daily.  60 tablet  11  . co-enzyme Q-10 50 MG capsule Take 50 mg by mouth daily.      . cyclobenzaprine (FLEXERIL) 5 MG tablet Take 1 tablet (5 mg total) by mouth every 8 (eight) hours as needed for muscle spasms.  60 tablet  1  . gabapentin  (NEURONTIN) 300 MG capsule Take 1 capsule (300 mg total) by mouth 2 (two) times daily.  60 capsule  3  . glucose blood test strip wavesense presto- use as instructed to check blood sugar 3x daily before meals & bedtime  100 each  11  . ibuprofen (ADVIL,MOTRIN) 800 MG tablet Take 800 mg by mouth every 6 (six) hours as needed. pain       . insulin NPH-insulin regular (NOVOLIN 70/30) (70-30) 100 UNIT/ML injection 38 units SQ in morning before breakfast 28 units SQ in evening before supper  10 mL  11  . Insulin Syringe-Needle U-100 (INSULIN SYRINGE .5CC/28G) 28G X 1/2" 0.5 ML MISC as directed.        . IRON PO Take 27 mg by mouth daily.      . Lancets 30G MISC Use to check blood sugar twice a day       . loratadine (CLARITIN) 10 MG tablet Take 10 mg by mouth 2 (two) times daily as needed. For allergies       . metFORMIN (GLUCOPHAGE) 1000 MG tablet Take 1 tablet (1,000 mg total) by mouth 2 (two) times daily with a meal.  60 tablet  11  .  metoprolol succinate (TOPROL-XL) 100 MG 24 hr tablet Take 1 tablet (100 mg total) by mouth daily.  30 tablet  11  . nitroGLYCERIN (NITRO-DUR) 0.4 mg/hr Place 1 patch (0.4 mg total) onto the skin daily.  30 patch  2  . nitroGLYCERIN (NITROSTAT) 0.4 MG SL tablet Place 1 tablet (0.4 mg total) under the tongue every 5 (five) minutes as needed for chest pain.  25 tablet  3  . Olopatadine HCl (PATADAY) 0.2 % SOLN Apply 1 drop to eye daily.      . potassium chloride SA (K-DUR,KLOR-CON) 20 MEQ tablet Take 2 tablets (40 mEq total) by mouth daily.  60 tablet  3  . Red Yeast Rice 600 MG TABS Take by mouth daily.      . Tamsulosin HCl (FLOMAX) 0.4 MG CAPS Take 1 capsule (0.4 mg total) by mouth at bedtime.  30 capsule  11  . Thiamine HCl (VITAMIN B-1) 250 MG tablet Take 250 mg by mouth daily.      . vitamin B-12 (CYANOCOBALAMIN) 500 MCG tablet Take 500 mcg by mouth daily.        Marland Kitchen LORazepam (ATIVAN) 1 MG tablet Take 1 tablet (1 mg total) by mouth at bedtime as needed for anxiety.   30 tablet  3  . DISCONTD: gabapentin (NEURONTIN) 300 MG capsule Take 1 capsule (300 mg total) by mouth 2 (two) times daily.  90 capsule  3    Past Medical History  Diagnosis Date  . Chest pain 11/07    cath 11/07 : LAD luminal irregularities, ramus intermediate with superior branch 70% stenosis, circumflex 50% stenosis, marginal 50% stenosis, well preserved ejection fraction. Stress test Jan 2013 : suggestion of lateral wall ischemia, likely artifact  . Diabetes mellitus type II     Insulin dependent. Has worked with Lupita Leash R previously.  Marland Kitchen Hypertension     Requires 5 drug therapy and still difficult to control.  Marland Kitchen GERD (gastroesophageal reflux disease)   . Hyperlipidemia     On daily statin  . Obesity     Morbid. HAs worked with Lupita Leash T previously.  . Colonic polyp 1995    Colonoscopies 1994, 2000, and 02/02/2011. Last had 9 polyps - Tubular adenoma and tubulovillous was the pathology results without high grade dysplasia  . OSA (obstructive sleep apnea) 02/16/2007    Moderate AHI 35.6, O2 sat decreased to 65%, CPAP titration to 16 with AHI of 3.6.  Marland Kitchen Chronic pain     MR 08/07/10 :  Spondylosis L4-5 with B foraminal narrowing and L4 nerve root enchroachment B.  . Seasonal allergies     seasonal  . 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 S/p lumpectomy and radiation.  . Iron deficiency anemia     Ferritin was 12 in 2012. on FeSo4. Has had colon and EGD 02/02/11 that showed mild gastritis and colon polyps.  . Chronic venous insufficiency 2012    Has had full W/U incl ECHO, LFT's, Cr, Umicro, TSH, BNP. Symptomatic treatment.  . Diastolic CHF, chronic     NL EF by echo 01/2012, Gr I dd    Past Surgical History  Procedure Date  . Mastectomy 2006    For breast CA  . Colonoscopy w/ polypectomy 1994, 2000, 2012  . Total abdominal hysterectomy 1985  . Carotid dopplers 08/24/2007    Normal. Done 2/2 dizziness.  . Cardiac catheterization 2007     Non obstructing CAD - [facility]  ROS: As stated in the HPI and negative for all other systems.   PHYSICAL EXAM BP 100/60  Pulse 72  Ht 5\' 1"  (1.549 m)  Wt 223 lb (101.152 kg)  BMI 42.14 kg/m2  LMP 07/12/1984 GENERAL:  Well appearing HEENT:  Pupils equal round and reactive, fundi not visualized, oral mucosa unremarkable NECK:  No jugular venous distention, waveform within normal limits, carotid upstroke brisk and symmetric, no bruits, no thyromegaly LYMPHATICS:  No cervical, inguinal adenopathy LUNGS:  Clear to auscultation bilaterally BACK:  No CVA tenderness HEART:  PMI not displaced or sustained,S1 and S2 within normal limits, no S3, no S4, no clicks, no rubs, no murmurs ABD:  Flat, positive bowel sounds normal in frequency in pitch, no bruits, no rebound, no guarding, no midline pulsatile mass, no hepatomegaly, no splenomegaly EXT:  2 plus pulses throughout, no edema, no cyanosis no clubbing  ASSESSMENT AND PLAN

## 2012-02-14 NOTE — Assessment & Plan Note (Signed)
The blood pressure is at target. No change in medications is indicated. We will continue with therapeutic lifestyle changes (TLC).  

## 2012-02-14 NOTE — Patient Instructions (Addendum)
The current medical regimen is effective;  continue present plan and medications.  Follow up in 6 months with Dr Hochrein.  You will receive a letter in the mail 2 months before you are due.  Please call us when you receive this letter to schedule your follow up appointment.  

## 2012-02-14 NOTE — Assessment & Plan Note (Signed)
The patient has no new sypmtoms.  No further cardiovascular testing is indicated.  We will continue with aggressive risk reduction and meds as listed.  She is happy with the outcome with the switch to nitroglycerin patch.

## 2012-02-15 ENCOUNTER — Ambulatory Visit (AMBULATORY_SURGERY_CENTER): Payer: Medicaid Other | Admitting: *Deleted

## 2012-02-15 VITALS — Ht 61.0 in | Wt 220.0 lb

## 2012-02-15 DIAGNOSIS — Z86010 Personal history of colonic polyps: Secondary | ICD-10-CM

## 2012-02-15 DIAGNOSIS — Z1211 Encounter for screening for malignant neoplasm of colon: Secondary | ICD-10-CM

## 2012-02-15 MED ORDER — PEG-KCL-NACL-NASULF-NA ASC-C 100 G PO SOLR: ORAL | Status: DC

## 2012-02-29 ENCOUNTER — Encounter: Payer: Self-pay | Admitting: Gastroenterology

## 2012-02-29 ENCOUNTER — Ambulatory Visit (AMBULATORY_SURGERY_CENTER): Payer: Medicaid Other | Admitting: Gastroenterology

## 2012-02-29 VITALS — BP 154/77 | HR 82 | Temp 97.8°F | Resp 15 | Ht 61.0 in | Wt 220.0 lb

## 2012-02-29 DIAGNOSIS — D126 Benign neoplasm of colon, unspecified: Secondary | ICD-10-CM

## 2012-02-29 DIAGNOSIS — K620 Anal polyp: Secondary | ICD-10-CM

## 2012-02-29 DIAGNOSIS — Z860100 Personal history of colon polyps, unspecified: Secondary | ICD-10-CM

## 2012-02-29 DIAGNOSIS — K621 Rectal polyp: Secondary | ICD-10-CM

## 2012-02-29 LAB — GLUCOSE, CAPILLARY: Glucose-Capillary: 101 mg/dL — ABNORMAL HIGH (ref 70–99)

## 2012-02-29 MED ORDER — SODIUM CHLORIDE 0.9 % IV SOLN: 500.0000 mL | INTRAVENOUS | Status: DC

## 2012-02-29 NOTE — Op Note (Signed)
[facility] [address] [place], [place]  [address]  COLONOSCOPY PROCEDURE REPORT  PATIENT:  [name], [name]  MR#:  [id number] BIRTHDATE:  08/23/1951, 60 yrs. old  GENDER:  female ENDOSCOPIST:  Rachael Fee, MD REF. BY:  Blanch Media, M.D. PROCEDURE DATE:  02/29/2012 PROCEDURE:  Colonoscopy with biopsy and snare polypectomy ASA CLASS:  Class II INDICATIONS:  multiple adenomas removed one year ago (8-9 polyps)  MEDICATIONS:   Fentanyl 75 mcg IV, These medications were titrated to patient response per physician's verbal order, Versed 8 mg IV  DESCRIPTION OF PROCEDURE:   After the risks benefits and alternatives of the procedure were thoroughly explained, informed consent was obtained.  Digital rectal exam was performed and revealed no rectal masses.   The LB [id number] endoscope was introduced through the anus and advanced to the cecum, which was identified by both the appendix and ileocecal valve, without limitations.  The quality of the prep was good..  The instrument was then slowly withdrawn as the colon was fully examined. <<PROCEDUREIMAGES>> FINDINGS:   Two small sessile polyps were found. One was 2mm across, located in descending colon, removed with forceps, sent to pathology (jar 1). One was 4mm across, located in rectum, removed with cold snare, sent to pathology (jar 1) (see image4 and image7).  This was otherwise a normal examination of the colon (see image1, image2, and image8).   Retroflexed views in the rectum revealed no abnormalities. COMPLICATIONS:  None  ENDOSCOPIC IMPRESSION: 1) Two small polyps, both were removed and both were sent to pathology 2) Otherwise normal examination  RECOMMENDATIONS: 1) Given your personal history of adenomatous (pre-cancerous) polyps, you will need a repeat colonoscopy in 5 years even if the polyps removed today are NOT precancerous.  Await pathology results for final  recommendations.  ______________________________ Rachael Fee, MD  n. eSIGNED:   Rachael Fee at 02/29/2012 10:22 AM  [name], [phone]

## 2012-02-29 NOTE — Patient Instructions (Signed)
Discharge instructions given with verbal understanding. Handout on polyps given. Resume previous medications. YOU HAD AN ENDOSCOPIC PROCEDURE TODAY AT THE [facility]: Refer to the procedure report that was given to you for any specific questions about what was found during the examination.  If the procedure report does not answer your questions, please call your gastroenterologist to clarify.  If you requested that your care partner not be given the details of your procedure findings, then the procedure report has been included in a sealed envelope for you to review at your convenience later.  YOU SHOULD EXPECT: Some feelings of bloating in the abdomen. Passage of more gas than usual.  Walking can help get rid of the air that was put into your GI tract during the procedure and reduce the bloating. If you had a lower endoscopy (such as a colonoscopy or flexible sigmoidoscopy) you may notice spotting of blood in your stool or on the toilet paper. If you underwent a bowel prep for your procedure, then you may not have a normal bowel movement for a few days.  DIET: Your first meal following the procedure should be a light meal and then it is ok to progress to your normal diet.  A half-sandwich or bowl of soup is an example of a good first meal.  Heavy or fried foods are harder to digest and may make you feel nauseous or bloated.  Likewise meals heavy in dairy and vegetables can cause extra gas to form and this can also increase the bloating.  Drink plenty of fluids but you should avoid alcoholic beverages for 24 hours.  ACTIVITY: Your care partner should take you home directly after the procedure.  You should plan to take it easy, moving slowly for the rest of the day.  You can resume normal activity the day after the procedure however you should NOT DRIVE or use heavy machinery for 24 hours (because of the sedation medicines used during the test).    SYMPTOMS TO REPORT IMMEDIATELY: A  gastroenterologist can be reached at any hour.  During normal business hours, 8:30 AM to 5:00 PM Monday through Friday, call [phone].  After hours and on weekends, please call the GI answering service at [phone] who will take a message and have the physician on call contact you.   Following lower endoscopy (colonoscopy or flexible sigmoidoscopy):  Excessive amounts of blood in the stool  Significant tenderness or worsening of abdominal pains  Swelling of the abdomen that is new, acute  Fever of 100F or higher  FOLLOW UP: If any biopsies were taken you will be contacted by phone or by letter within the next 1-3 weeks.  Call your gastroenterologist if you have not heard about the biopsies in 3 weeks.  Our staff will call the home number listed on your records the next business day following your procedure to check on you and address any questions or concerns that you may have at that time regarding the information given to you following your procedure. This is a courtesy call and so if there is no answer at the home number and we have not heard from you through the emergency physician on call, we will assume that you have returned to your regular daily activities without incident.  SIGNATURES/CONFIDENTIALITY: You and/or your care partner have signed paperwork which will be entered into your electronic medical record.  These signatures attest to the fact that that the information above on your After Visit Summary has   been reviewed and is understood.  Full responsibility of the confidentiality of this discharge information lies with you and/or your care-partner. 

## 2012-02-29 NOTE — Progress Notes (Signed)
Patient did not experience any of the following events: a burn prior to discharge; a fall within the facility; wrong site/side/patient/procedure/implant event; or a hospital transfer or hospital admission upon discharge from the facility. (G8907) Patient did not have preoperative order for IV antibiotic SSI prophylaxis. (G8918)  

## 2012-02-29 NOTE — Progress Notes (Signed)
Pressure applied to the abdomen to reach the cecum 

## 2012-03-01 ENCOUNTER — Telehealth: Payer: Self-pay | Admitting: *Deleted

## 2012-03-01 LAB — GLUCOSE, CAPILLARY
Glucose-Capillary: 77 mg/dL (ref 70–99)
Glucose-Capillary: 76 mg/dL (ref 70–99)

## 2012-03-01 NOTE — Telephone Encounter (Signed)
No answer, message left for the patient. 

## 2012-03-03 ENCOUNTER — Encounter: Payer: Self-pay | Admitting: Gastroenterology

## 2012-03-03 ENCOUNTER — Encounter: Payer: Self-pay | Admitting: Internal Medicine

## 2012-03-09 ENCOUNTER — Ambulatory Visit (INDEPENDENT_AMBULATORY_CARE_PROVIDER_SITE_OTHER): Payer: Medicaid Other | Admitting: Internal Medicine

## 2012-03-09 ENCOUNTER — Encounter: Payer: Self-pay | Admitting: Internal Medicine

## 2012-03-09 VITALS — BP 135/77 | HR 68 | Temp 97.4°F | Ht 61.0 in | Wt 220.4 lb

## 2012-03-09 DIAGNOSIS — M25559 Pain in unspecified hip: Secondary | ICD-10-CM

## 2012-03-09 DIAGNOSIS — M7060 Trochanteric bursitis, unspecified hip: Secondary | ICD-10-CM

## 2012-03-09 DIAGNOSIS — G5600 Carpal tunnel syndrome, unspecified upper limb: Secondary | ICD-10-CM | POA: Insufficient documentation

## 2012-03-09 DIAGNOSIS — M25551 Pain in right hip: Secondary | ICD-10-CM

## 2012-03-09 DIAGNOSIS — M76899 Other specified enthesopathies of unspecified lower limb, excluding foot: Secondary | ICD-10-CM

## 2012-03-09 DIAGNOSIS — I2510 Atherosclerotic heart disease of native coronary artery without angina pectoris: Secondary | ICD-10-CM

## 2012-03-09 DIAGNOSIS — I872 Venous insufficiency (chronic) (peripheral): Secondary | ICD-10-CM

## 2012-03-09 DIAGNOSIS — E119 Type 2 diabetes mellitus without complications: Secondary | ICD-10-CM

## 2012-03-09 LAB — GLUCOSE, CAPILLARY: Glucose-Capillary: 136 mg/dL — ABNORMAL HIGH (ref 70–99)

## 2012-03-09 MED ORDER — INSULIN NPH ISOPHANE & REGULAR (70-30) 100 UNIT/ML ~~LOC~~ SUSP: SUBCUTANEOUS | Status: DC

## 2012-03-09 NOTE — Assessment & Plan Note (Signed)
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 She says that she had nerve studies many years ago, but she is unsure of the exact result. She has been wearing a brace on the restless progressively increasing induration for about the past year. She currently wears it at night. She did get a corticosteroid injection in 1999 and the left wrist. She reports numbness of the entire left hand as well as pain moving proximally into the right forearm. It is difficult to tell what component of this pain is straight the carpal tunnel versus diabetic neuropathy, which she also suffers from. She has such long-standing CTS that it is hard to say what benefits she might get from surgical intervention. I recommended that since she is going to sports medicine anyway for her hip, that they look at her wrist and potentially give her injection in the wrist as well. She should also discuss long-term management with them, given that she has now progressed to wearing a brace for a significant portion of the day and seems to be failing this conservative management. - sport medicine tomorrow

## 2012-03-09 NOTE — Assessment & Plan Note (Signed)
Patient presents with worsening right hip pain with radiation down the lateral thigh. Has a history of steroid injection for possible trochanteric bursitis and March 2012. Hip X-rays from 2010 were unremarkable. Based on exam today, my differential includes trochanteric bursitis versus lateral femoral cutaneous nerve syndrome. She does have significant point tenderness over the trochanter, and did get relief from prior injection, which would favor this diagnosis. However, she does electrical type have pain over the lateral and anterior thigh, in a distribution consistent with LFCN syndrome. She is obese, and has diabetes, both of which are risk factors for this. She is not, however, have subjective numbness over those areas, which goes against that diagnosis. I think she would benefit from a steroid injection, but unfortunately we do not have that K. bili today in clinic. I've referred her to sports medicine for an appointment tomorrow. - Sports medicine tomorrow

## 2012-03-09 NOTE — Progress Notes (Signed)
Subjective:     Patient ID: [name], female   DOB: 08-04-1951, 61 y.o.   MRN: [id number]  HPI Patient is a very pleasant 61-year-old woman with history of diabetes, hypertension, hyperlipidemia, coronary artery disease, chronic pain, who presents with right hip pain.  Patient says she has chronic lower back, right hip, and leg pain, but that her right hip has been acutely worsening over the past few weeks. The pain is over the outside of the right hip and radiates down the lateral leg. It is sharp, burning, and "like lightening". She has tried rolling on it with her roller, ice,  heat, and ibuprofen, only with minimal relief. She did receive an injection here in the internal medicine clinic in March of 2012 for possible trochanteric bursitis, which brought her some relief.  Review of Systems Chronic pain in back, hands, and feet.    Objective:   Physical Exam Gen: NAD, obese, cooperative R Leg: TTP over R trochanteric bursa with radiation into leg.  Minimal ROM given obesity and pain.  No subjective numbness of lateral or anterior thigh.    Assessment:         Plan:

## 2012-03-10 ENCOUNTER — Ambulatory Visit (INDEPENDENT_AMBULATORY_CARE_PROVIDER_SITE_OTHER): Payer: Medicaid Other | Admitting: Family Medicine

## 2012-03-10 ENCOUNTER — Encounter: Payer: Self-pay | Admitting: Family Medicine

## 2012-03-10 VITALS — BP 123/74 | HR 74 | Ht 61.0 in | Wt 220.0 lb

## 2012-03-10 DIAGNOSIS — G5600 Carpal tunnel syndrome, unspecified upper limb: Secondary | ICD-10-CM

## 2012-03-10 DIAGNOSIS — M25559 Pain in unspecified hip: Secondary | ICD-10-CM

## 2012-03-10 DIAGNOSIS — M25551 Pain in right hip: Secondary | ICD-10-CM

## 2012-03-10 NOTE — Progress Notes (Signed)
  Subjective:    Patient ID: [name], female    DOB: [birth year], 61 y.o.   MRN: [id number]  HPI  #1. Right hip pain. This has been getting worse over the last year. Had this about 3 years ago with similar symptoms and had an injection that helped her for greater than a year. Pain is worse with trying to stand, after walking for 1-2 minutes. Also bothers her at night when she tries to lie on that side. Radiates down to right below the right knee on the lateral portion of her leg. No leg weakness. No numbness in her leg or foot. Pain is sharp 4-8/10. Really limiting her activities.  #2. History of left carpal tunnel syndrome. Say she had some nerve conduction studies done about 20 years ago. Has continued to have pain and numbness since then. Wears cockup splints during the day and occasionally at night. Initially was experiencing numbness only in her thumb and first 2 fingers but now she's noticing numbness across all fingers. She is right-hand dominant.  PERTINENT  PMH / PSH: No prior history of surgery on her hip or left wrist. Diabetes.  Review of Systems Denies unusual weight change recently. Denies fever, sweats, chills. Please see history of present illness above for additional partner review of systems.    Objective:   Physical Exam  Vital signs reviewed. GENERAL: Well developed, well nourished, no acute distress. Obese. HIPS: Tender to palpation over the right greater trochanter area and this reproduces her pain. She has full range of motion in internal and external rotation of both hips. Her hip flexors are 4/5 on the right I think this is secondary to pain and 5 out of 5 on the left. She is able to get up and down from the exam table but with some pain and difficulty. WRISTS: Left. Positive Phalen, negative Tinel. There is no thenar atrophy. Grip strength is normal. Soft touch sensation is intact.  ULTRASOUND:  Left carpal tunnel is evaluated. The median nerve is compressed  and flattened. The total area squared is 0.16 cm square. The flexor tendons are intact and without observable abnormality.   INJECTION: Patient was given informed consent, signed copy in the chart. Appropriate time out was taken. Area prepped and draped in usual sterile fashion. One cc of methylprednisolone 40 mg/ml plus  4 cc of 1% lidocaine without epinephrine was injected into the right greater trochanteric bursa using a(n) perpendicular approach. The patient tolerated the procedure well. A spinal needle was used for the injection. There were no complications. Post procedure instructions were given.     Assessment & Plan:  #1. Greater trochanteric bursitis on the right. We did a corticosteroid injection today. She would benefit from restarting her home exercise program for hip flexibility and strengthening. She has previously been in physical therapy for that and says she remembers the exercises #2. Likely carpal tunnel syndrome in the left hand. Given her diabetes I don't want to do 2 corticosteroid injections today so I will see her back in 3-4 weeks to followup her hip and further evaluate her left wrist.  #3. At last visit she also showed me a an apparent ganglion cyst on her right wrist. We'll look at that next office visit as well

## 2012-03-31 ENCOUNTER — Ambulatory Visit (HOSPITAL_COMMUNITY)
Admission: RE | Admit: 2012-03-31 | Discharge: 2012-03-31 | Disposition: A | Discharge status: Home / Self Care | Payer: Medicaid Other | Source: Ambulatory Visit | Attending: Family Medicine | Admitting: Family Medicine

## 2012-03-31 ENCOUNTER — Ambulatory Visit (INDEPENDENT_AMBULATORY_CARE_PROVIDER_SITE_OTHER): Payer: Medicaid Other | Admitting: Family Medicine

## 2012-03-31 VITALS — BP 121/73

## 2012-03-31 DIAGNOSIS — M76899 Other specified enthesopathies of unspecified lower limb, excluding foot: Secondary | ICD-10-CM | POA: Insufficient documentation

## 2012-03-31 DIAGNOSIS — M25559 Pain in unspecified hip: Secondary | ICD-10-CM

## 2012-03-31 DIAGNOSIS — M6740 Ganglion, unspecified site: Secondary | ICD-10-CM

## 2012-03-31 DIAGNOSIS — C50919 Malignant neoplasm of unspecified site of unspecified female breast: Secondary | ICD-10-CM

## 2012-03-31 DIAGNOSIS — M25551 Pain in right hip: Secondary | ICD-10-CM

## 2012-03-31 DIAGNOSIS — M67439 Ganglion, unspecified wrist: Secondary | ICD-10-CM

## 2012-03-31 MED ORDER — IBUPROFEN 800 MG PO TABS: 800.0000 mg | ORAL_TABLET | Freq: Three times a day (TID) | ORAL | Status: DC | PRN

## 2012-03-31 NOTE — Progress Notes (Signed)
  Subjective:    Patient ID: [name], female    DOB: 1951-01-05, 61 y.o.   MRN: [id number]  HPI  #1. Followup hip pain. I had given her a steroid injection in the greater trochanteric bursa at last office visit and it did not seem to help at all. She is very puzzled as MI. The symptoms seem very consistent with what she had in the past which did resolve with a corticosteroid injection. She is using a lot of ibuprofen on a daily basis now to even do her walking necessary for her ADLs. #2. Has a cyst on her wrist of the right hand. It sometimes gets larger and is smaller. Does not hurt but it bothered her some. She wants to know what it is, what her options are for resolution.  PERTINENT  PMH / PSH: Hx breast cancer Review of Systems    denies unusual weight change, fever, sweats, chills. She's had no numbness or tingling in her fingers. She's had no falls. Objective:   Physical Exam  Vital signs reviewed. GENERAL: Well developed, well nourished, no acute distress. Obese. HIPS: Right hip internal and external rotation is a little bit painful. She is limited about 5 on external rotation of the hip compared with the left side. Full flexion and extension at the hip and lower stream he strength is 5 out of 5. WRIST right: Small mobile 1 cm in diameter mass consistent with ganglion cysts. There are no skin changes over this area. Distally she is neurovascularly intact. She has normal grip strength. Her wrist movement flexion extension ulnar deviation radial deviation are all normal. ; Ultrasound: the mass shows echoes consistent with a septated cyst. He is quite near one of her veins.      Assessment & Plan:

## 2012-03-31 NOTE — Patient Instructions (Addendum)
I have ordered hip x rays  Please get these done at your convenience I have also given you a prescription for ibuprofen. I would recommend you add tylenol (2 ES tylenol three times a day) I will lsee  You back at your conveneience

## 2012-03-31 NOTE — Assessment & Plan Note (Signed)
What I thought was greater trochanteric bursitis has not resolved with corticosteroid injection. I reviewed her films from 2009 where she had some early degenerative changes. We'll get dedicated hip film of the right side today. I will put her on scheduled ibuprofen 800 mg Q8. We discussed not using any additional over-the-counter NSAIDs and that this was the max dose. She can use,. I will see her back next week to followup her films. I suspect this is mostly arthritis. She is a breast cancer survivor so I think getting films is imperative.

## 2012-04-04 ENCOUNTER — Encounter: Payer: Self-pay | Admitting: Family Medicine

## 2012-04-04 ENCOUNTER — Telehealth: Payer: Self-pay | Admitting: Family Medicine

## 2012-04-04 NOTE — Telephone Encounter (Signed)
[name] / [name] I sent her a letter about her hip but with the holiday I doubt she gets it befor next week. I want to send her to ortho--he hip films show right sided arthritis. I want irtho to evaluate for options---including surgery. Does not mean she HAS to have surgery, but their opinion would be helpful. See if she is OK with this--I would send to [facility] ortho---either Dr Luiz Blare or  Turner Daniels. THANKS! Denny Levy

## 2012-04-10 NOTE — Telephone Encounter (Signed)
[name] / [name] This came back to me also--have you done this? Id so, document and close THANKS! Denny Levy

## 2012-04-11 NOTE — Telephone Encounter (Signed)
Called pt last week and left message for her to contact us with which ortho doc she would like to see.  No response as of today.

## 2012-04-12 ENCOUNTER — Encounter: Payer: Self-pay | Admitting: *Deleted

## 2012-04-12 NOTE — Patient Instructions (Signed)
APPT WITH DR ROWAN THURS 7.18.13 AT 1145AM ARRIVE AT 1130AM FOR REGISTRATION [address] PT INFORMED

## 2012-04-13 ENCOUNTER — Ambulatory Visit (INDEPENDENT_AMBULATORY_CARE_PROVIDER_SITE_OTHER): Payer: Medicaid Other | Admitting: Dietician

## 2012-04-13 VITALS — Ht 61.0 in | Wt 220.6 lb

## 2012-04-13 DIAGNOSIS — E119 Type 2 diabetes mellitus without complications: Secondary | ICD-10-CM

## 2012-04-13 DIAGNOSIS — Z79899 Other long term (current) drug therapy: Secondary | ICD-10-CM

## 2012-04-13 LAB — GLUCOSE, CAPILLARY: Glucose-Capillary: 114 mg/dL — ABNORMAL HIGH (ref 70–99)

## 2012-04-13 LAB — POCT GLYCOSYLATED HEMOGLOBIN (HGB A1C): Hemoglobin A1C: 7.2

## 2012-04-13 MED ORDER — ACCU-CHEK NANO SMARTVIEW W/DEVICE KIT: 1.0000 | PACK | Freq: Three times a day (TID) | Status: DC

## 2012-04-13 MED ORDER — ACCU-CHEK FASTCLIX LANCETS MISC: 1.0000 | Freq: Three times a day (TID) | Status: DC

## 2012-04-13 MED ORDER — GLUCOSE BLOOD VI STRP: ORAL_STRIP | Status: DC

## 2012-04-13 NOTE — Patient Instructions (Addendum)
Please write down all the foods and beverages you eat and drink in a day before our next visit and bring this with you.  Please do not skip lunch- eat a starch, a protein and vegetable.  Check your blood a sugar between 2 and 3 am before our next visit and bring meter with you.   Always carry some candy or glucose tablets with you at all times in case you have symptoms of low blood sugar- dizzy, sweaty, cold, clammy, disoriented, speech off, cannot walk straight. TREAT to prevent it from getting worse.   Check blood sugar more anytime your insulin dose or food intake or activity changes.   Please make a follow up in 3-6 weeks, bring meter and food journal.

## 2012-04-13 NOTE — Progress Notes (Signed)
Cut back on water because she is up all night going to the bathroom, but still gets upeats fruit and vegetables Blood pressure 123/73 last night around 8 PM,  Diabetes Self-Management Training (DSMT)  Initial Visit  04/13/2012 [name], identified by name and date of birth, is a 61 y.o. female with Type 2 Diabetes. Year of diabetes diagnosis: She presented a bit wobbly and out of sorts for her visit, days she woke up feeling dizzy and confused. id not take insulin or check cbg prior to visit  ASSESSMENT Patient concerns are Nutrition/meal planning, Glycemic control and Weight control.  Height 5\' 1"  (1.549 m), weight 220 lb 9.6 oz (100.064 kg), last menstrual period 07/12/1984. Body mass index is 41.68 kg/(m^2). Lab Results  Component Value Date   LDLCALC 79 10/10/2011   Lab Results  Component Value Date   HGBA1C 7.2 04/13/2012   Labs and health maintenance. Patients belief/attitude about diabetes: Diabetes can be controlled. Self foot exams daily: Yes Diabetes Complications: Neuropathy Support systems: daughter and grandhcildren Special needs: written information to supplement discussion Prior DM Education: Yes   Medications See Medications list.  Needs skills/knowledge review   Exercise Plan Doing ADLs     not very active because hip pain  Self-Monitoring  Monitor:recently purchased [employer] meter- was told insurance did not cover, gave patient a Accu chek nano meter today and result was 106. She forgot her meter and logbook today Frequency of testing: 1-2 times/day Breakfast: 253 Supper:  Bedtime: 143  Hyperglycemia: Yes Weekly Hypoglycemia: Yes Weekly   Meal Planning Some knowledge and Interested in improving Eating 4 fruit servings each day- small orange, small apple, handful Cut out sweets, cut back on starches BMs normal  Blood sugar dropping in evening 3-4 PM Insulin 8-10 am Plain cheerios with almond milk 30 minutes later 10:30 water Noon-3 PM cup  of okra, stir fried in olive oil and lemon pepper- often skips, just eats fruit- stomach feels full, cannot get food down, fried corn 5-530 insulin 630- stir fried fish and rice, lemonade with more water 630 pm crackers and cheese Water Fruit throughout the day as described above. Food and meat not taking good, only fruit tastes good.    2 glasses water  Assessment comments: patient is concerned about weight loss and blood sugar control. Suspect she is having nocturnal hypoglycemia and she reports afternoon hypoglycemia. This is likely caused by skipping lunch and fluctuating intake    INDIVIDUAL DIABETES EDUCATION PLAN:  Nutrition management Medication Monitoring Acute complication: _______________________________________________________________________  Intervention TOPICS COVERED TODAY:  Nutrition management  Role of diet in the treatment of diabetes and the relationship between the three main macronutritents and blood glucose control. Meal timing in regards to the patients' current diabetes medication. Monitoring  Taught/evaluated SMBG with accu chek nano meter. Purpose and frequency of SMBG. Identified appropriate SMBG and A1C goals. Acute complication  Taught treatment of hypoglycemia - the 15 rule.  PATIENTS GOALS/PLAN (copy and paste in patient instructions so patient receives a copy): 1.  Learning Objective:       State importance of matching insulin to food intake and activity 2.  Behavioral Objective:         Monitoring: To identify blood glucose trends, I will test blood glucose before bed, 3 a.m. and fasting and bring meter and food record to next visit Never 0%  Personalized Follow-Up Plan for Ongoing Self Management Support:  Doctor's Office, friends, family, internet communities and CDE visits ______________________________________________________________________  Outcomes Expected outcomes: Demonstrated interest in learning.Expect positive changes in  lifestyle. Self-care Barriers: Lack of transportation, Lack of material resources Education material provided: yes- "calories in = Calories out" Patient to contact team via Phone if problems or questions. Time in: 1200     Time out: 1300 Future DSMT - 4-6 wks   Colm Lyford, [name]

## 2012-04-14 ENCOUNTER — Ambulatory Visit: Payer: Medicaid Other | Admitting: Family Medicine

## 2012-05-16 ENCOUNTER — Encounter (HOSPITAL_COMMUNITY): Payer: Self-pay | Admitting: Emergency Medicine

## 2012-05-16 ENCOUNTER — Emergency Department (HOSPITAL_COMMUNITY): Payer: Medicaid Other

## 2012-05-16 ENCOUNTER — Emergency Department (HOSPITAL_COMMUNITY)
Admission: EM | Admit: 2012-05-16 | Discharge: 2012-05-16 | Disposition: A | Discharge status: Home / Self Care | Payer: Medicaid Other | Attending: Emergency Medicine | Admitting: Emergency Medicine

## 2012-05-16 DIAGNOSIS — Z79899 Other long term (current) drug therapy: Secondary | ICD-10-CM | POA: Insufficient documentation

## 2012-05-16 DIAGNOSIS — R0789 Other chest pain: Secondary | ICD-10-CM | POA: Insufficient documentation

## 2012-05-16 DIAGNOSIS — R141 Gas pain: Secondary | ICD-10-CM | POA: Insufficient documentation

## 2012-05-16 DIAGNOSIS — Z794 Long term (current) use of insulin: Secondary | ICD-10-CM | POA: Insufficient documentation

## 2012-05-16 DIAGNOSIS — R142 Eructation: Secondary | ICD-10-CM | POA: Insufficient documentation

## 2012-05-16 DIAGNOSIS — I10 Essential (primary) hypertension: Secondary | ICD-10-CM | POA: Insufficient documentation

## 2012-05-16 DIAGNOSIS — E119 Type 2 diabetes mellitus without complications: Secondary | ICD-10-CM | POA: Insufficient documentation

## 2012-05-16 DIAGNOSIS — R609 Edema, unspecified: Secondary | ICD-10-CM | POA: Insufficient documentation

## 2012-05-16 DIAGNOSIS — R079 Chest pain, unspecified: Secondary | ICD-10-CM | POA: Insufficient documentation

## 2012-05-16 DIAGNOSIS — M7989 Other specified soft tissue disorders: Secondary | ICD-10-CM | POA: Insufficient documentation

## 2012-05-16 LAB — CBC
HCT: 41 % (ref 36.0–46.0)
Hemoglobin: 13.4 g/dL (ref 12.0–15.0)
WBC: 7.2 10*3/uL (ref 4.0–10.5)

## 2012-05-16 LAB — POCT I-STAT TROPONIN I: Troponin i, poc: 0 ng/mL (ref 0.00–0.08)

## 2012-05-16 LAB — BASIC METABOLIC PANEL
BUN: 12 mg/dL (ref 6–23)
Chloride: 103 mEq/L (ref 96–112)
GFR calc Af Amer: >90 mL/min (ref >90)
GFR calc non Af Amer: 89 mL/min — ABNORMAL LOW (ref >90)
Glucose, Bld: 90 mg/dL (ref 70–99)
Potassium: 4 mEq/L (ref 3.5–5.1)
Sodium: 141 mEq/L (ref 135–145)

## 2012-05-16 MED ORDER — MORPHINE SULFATE 4 MG/ML IJ SOLN
4.0000 mg | Freq: Once | INTRAMUSCULAR | Status: AC
  Administered 2012-05-16: 4 mg via INTRAVENOUS
  Filled 2012-05-16: qty 1

## 2012-05-16 MED ORDER — ONDANSETRON HCL 4 MG/2ML IJ SOLN
4.0000 mg | Freq: Once | INTRAMUSCULAR | Status: AC
  Administered 2012-05-16: 4 mg via INTRAVENOUS
  Filled 2012-05-16: qty 2

## 2012-05-16 MED ORDER — GI COCKTAIL ~~LOC~~
30.0000 mL | Freq: Once | ORAL | Status: AC
  Administered 2012-05-16: 30 mL via ORAL
  Filled 2012-05-16: qty 30

## 2012-05-16 MED ORDER — KETOROLAC TROMETHAMINE 30 MG/ML IJ SOLN
30.0000 mg | Freq: Once | INTRAMUSCULAR | Status: AC
  Administered 2012-05-16: 30 mg via INTRAVENOUS
  Filled 2012-05-16: qty 1

## 2012-05-16 MED ORDER — ASPIRIN EC 81 MG PO TBEC
162.0000 mg | DELAYED_RELEASE_TABLET | Freq: Once | ORAL | Status: AC
  Administered 2012-05-16: 162 mg via ORAL
  Filled 2012-05-16 (×2): qty 2

## 2012-05-16 MED ORDER — SODIUM CHLORIDE 0.9 % IV SOLN
Freq: Once | INTRAVENOUS | Status: AC
  Administered 2012-05-16: 1000 mL via INTRAVENOUS

## 2012-05-16 NOTE — ED Notes (Signed)
Pt c/o left sided CP x 2 days that was worse last night; pt sts took 8 SL nitro during the night; pt denies SOB or nausea with pain

## 2012-05-16 NOTE — ED Notes (Signed)
MD at bedside. 

## 2012-05-16 NOTE — ED Provider Notes (Signed)
History     CSN: [id number]  Arrival date & time 05/16/12  1120   First MD Initiated Contact with Patient 05/16/12 1400      Chief Complaint  Patient presents with  . Chest Pain    (Consider location/radiation/quality/duration/timing/severity/associated sxs/prior treatment) HPI This is a 61 year old woman, with a past medical history of hypertension, diabetes, congestive heart failure, who presents with chest pain for the last 24 hours. The patient was well until yesterday morning when she started experiencing chest pains with difficulty in breathing, and general body weakness. The pain is mainly on the left side of her chest and it is recurrent with a severity of 10 /10 when is worst and 6/10 when it comes down. She took about 10 tablets of nitroglycerin with recurrent chest pain, but she did not get any improvement. She describes the pain as sharp and nonradiating. She has no palpitations or diaphoresis. Her chest pain does not change with activity. However, she complains of nausea; no vomiting. She denies a history of a cough, cold or fever. The patient has had similar episodes of chest pain with the last one being in February 2013 and they are all usually troponin and EKG are negative.   She is being followed up by [facility] (Dr. Mauri Brooklyn). Cardia cath 11/07 : LAD luminal irregularities, ramus intermediate with superior branch 70% stenosis, circumflex 50% stenosis, marginal 50% stenosis, well preserved ejection fraction. A stress Myoview in 10/2011 raised the question of some ischemia in the lateral wall with exercise. She was recently prescribed a nitroglycerin patch. She had a cardiac in 2007 ( no records on file) and she was told that she has some blockades but not amenable to stents. She was advised to continue with medical therapy.  She does not smoke cigarettes or use alcohol. The father had hypertension and diabetes, well as her brother has congestive heart failure and a  history of a heart attack.   Past Medical History  Diagnosis Date  . Chest pain 11/07    cath 11/07 : LAD luminal irregularities, ramus intermediate with superior branch 70% stenosis, circumflex 50% stenosis, marginal 50% stenosis, well preserved ejection fraction. Stress test Jan 2013 : suggestion of lateral wall ischemia, likely artifact  . Hypertension     Requires 5 drug therapy and still difficult to control.  Marland Kitchen GERD (gastroesophageal reflux disease)   . Hyperlipidemia     On daily statin  . Obesity     Morbid. HAs worked with Lupita Leash T previously.  . Colonic polyp 1995    Colonoscopies 1994, 2000, and 02/02/2011. Last had 9 polyps - Tubular adenoma and tubulovillous was the pathology results without high grade dysplasia  . OSA (obstructive sleep apnea) 02/16/2007    Moderate AHI 35.6, O2 sat decreased to 65%, CPAP titration to 16 with AHI of 3.6.  Marland Kitchen Chronic pain     MR 08/07/10 :  Spondylosis L4-5 with B foraminal narrowing and L4 nerve root enchroachment B.  . Seasonal allergies     seasonal  . Iron deficiency anemia     Ferritin was 12 in 2012. on FeSo4. Has had colon and EGD 02/02/11 that showed mild gastritis and colon polyps.  . Chronic venous insufficiency 2012    Has had full W/U incl ECHO, LFT's, Cr, Umicro, TSH, BNP. Symptomatic treatment.  . Diastolic CHF, chronic     NL EF by echo 01/2012, Gr I dd  . Arthritis     knees  . 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 S/p lumpectomy and radiation.  . Diabetes mellitus type II     Insulin dependent. Has worked with Lupita Leash R previously.    Past Surgical History  Procedure Date  . Colonoscopy w/ polypectomy 1994, 2000, 2012  . Total abdominal hysterectomy 1985  . Carotid dopplers 08/24/2007    Normal. Done 2/2 dizziness.  . Cardiac catheterization 2007    Non obstructing CAD - [place]  . Breast lumpectomy w/ needle localization 2006    34 Chemo RX    Family History  Problem  Relation Age of Onset  . Depression Daughter 4  . Osteoarthritis Maternal Grandmother   . Lung cancer Mother   . Diabetes Father   . Breast cancer Sister   . Pulmonary embolism Brother   . Colon cancer Neg Hx     History  Substance Use Topics  . Smoking status: Never Smoker   . Smokeless tobacco: Never Used  . Alcohol Use: No    OB History    Grav Para Term Preterm Abortions TAB SAB Ect Mult Living                  Review of Systems  Constitutional: Negative.   HENT: Negative.   Eyes: Negative.   Respiratory: Positive for chest tightness. Negative for apnea, wheezing and stridor.   Cardiovascular: Positive for leg swelling.  Genitourinary: Negative.   Musculoskeletal: Negative.   Skin: Negative.   Neurological: Negative for tremors, syncope, speech difficulty and light-headedness.  Hematological: Negative.   Psychiatric/Behavioral: Negative.     Allergies  Diphenhydramine hcl; Lisinopril; and Oxycodone-acetaminophen  Home Medications   Current Outpatient Rx  Name Route Sig Dispense Refill  . ACCU-CHEK FASTCLIX LANCETS MISC Does not apply 1 each by Does not apply route 3 (three) times daily before meals. Dx code- 250.00    . AMLODIPINE BESYLATE 10 MG PO TABS Oral Take 1 tablet (10 mg total) by mouth daily. 90 tablet 3  . ASPIRIN 81 MG PO TBEC Oral Take 1 tablet (81 mg total) by mouth daily.    . ATORVASTATIN CALCIUM 80 MG PO TABS Oral Take 1 tablet (80 mg total) by mouth daily. 30 tablet 11  . BILBERRY PO Oral Take 1,000 mg by mouth daily.      Marland Kitchen BLACK COHOSH PO Oral Take by mouth daily.    Marland Kitchen ACCU-CHEK NANO SMARTVIEW W/DEVICE KIT Does not apply 1 each by Does not apply route 3 (three) times daily with meals. 1 kit 0  . CALCIUM & MAGNESIUM CARBONATES 311-232 MG PO TABS Oral Take 1 tablet by mouth daily.    Marland Kitchen VITAMIN D3 400 UNITS PO CAPS Oral Take 400 Int'l Units by mouth at bedtime.      Marland Kitchen CLONIDINE HCL 0.2 MG PO TABS Oral Take 1 tablet (0.2 mg total) by mouth 2  (two) times daily. 60 tablet 11  . CO-ENZYME Q-10 50 MG PO CAPS Oral Take 50 mg by mouth daily.    . CYCLOBENZAPRINE HCL 5 MG PO TABS Oral Take 1 tablet (5 mg total) by mouth every 8 (eight) hours as needed for muscle spasms. 60 tablet 1  . GABAPENTIN 300 MG PO CAPS Oral Take 1 capsule (300 mg total) by mouth 2 (two) times daily. 60 capsule 3  . GLUCOSE BLOOD VI STRP  Check blood sugar 3 times each day before meals. Dx code 250.00 100 each 12  .  IBUPROFEN 800 MG PO TABS Oral Take 1 tablet (800 mg total) by mouth every 8 (eight) hours as needed for pain. 90 tablet 1  . INSULIN ISOPHANE & REGULAR (70-30) 100 UNIT/ML [place] SUSP  38 units SQ in morning before breakfast, 28 units SQ in evening before supper 30 mL 11  . INSULIN SYRINGE 28G X 1/2" 0.5 ML MISC  as directed.      . IRBESARTAN 150 MG PO TABS Oral Take 75 mg by mouth daily.    . IRON PO Oral Take 27 mg by mouth daily.    Marland Kitchen LORATADINE 10 MG PO TABS Oral Take 10 mg by mouth 2 (two) times daily as needed. For allergies     . LORAZEPAM 1 MG PO TABS Oral Take 1 tablet (1 mg total) by mouth at bedtime as needed for anxiety. 30 tablet 3  . METFORMIN HCL 1000 MG PO TABS Oral Take 1 tablet (1,000 mg total) by mouth 2 (two) times daily with a meal. 60 tablet 11  . METOPROLOL SUCCINATE ER 100 MG PO TB24 Oral Take 1 tablet (100 mg total) by mouth daily. 30 tablet 11  . NITROGLYCERIN 0.4 MG/HR TD PT24 Transdermal Place 1 patch (0.4 mg total) onto the skin daily. 30 patch 2  . NITROGLYCERIN 0.4 MG SL SUBL Sublingual Place 1 tablet (0.4 mg total) under the tongue every 5 (five) minutes as needed for chest pain. 25 tablet 3  . OLOPATADINE HCL 0.2 % OP SOLN Ophthalmic Apply 1 drop to eye daily.    Marland Kitchen POTASSIUM CHLORIDE CRYS ER 20 MEQ PO TBCR Oral Take 2 tablets (40 mEq total) by mouth daily. 60 tablet 3  . RED YEAST RICE 600 MG PO TABS Oral Take by mouth daily.    Marland Kitchen TAMSULOSIN HCL 0.4 MG PO CAPS Oral Take 0.4 mg by mouth at bedtime as needed.  30 capsule 11     Rx by urology  . VITAMIN B-1 250 MG PO TABS Oral Take 250 mg by mouth daily.    Marland Kitchen VITAMIN B-12 500 MCG PO TABS Oral Take 500 mcg by mouth daily.        BP 172/106  Pulse 80  Temp 98.3 F (36.8 C) (Oral)  Resp 18  SpO2 99%  LMP 07/12/1984  Physical Exam  Constitutional: She is oriented to person, place, and time. She appears well-developed and well-nourished. She appears distressed.       Obese  HENT:  Head: Normocephalic and atraumatic.  Eyes: Conjunctivae and EOM are normal. Pupils are equal, round, and reactive to light.  Neck: Normal range of motion. Neck supple.  Cardiovascular: Normal rate, regular rhythm and normal heart sounds.  Exam reveals no gallop and no friction rub.   No murmur heard. Pulmonary/Chest: Effort normal and breath sounds normal. She has no wheezes. She has no rales. She exhibits no tenderness.  Abdominal: Soft. She exhibits distension. She exhibits no mass. There is no tenderness. There is no rebound and no guarding.  Musculoskeletal: She exhibits edema. She exhibits no tenderness.  Neurological: She is alert and oriented to person, place, and time. No cranial nerve deficit. Coordination normal.  Skin: Skin is warm and dry. She is not diaphoretic.    ED Course  Procedures (including critical care time)  Labs Reviewed  CBC - Abnormal; Notable for the following:    RDW 16.9 (*)     All other components within normal limits  BASIC METABOLIC PANEL - Abnormal; Notable for the  following:    GFR calc non Af Amer 89 (*)     All other components within normal limits  POCT I-STAT TROPONIN I   Dg Chest 2 View  05/16/2012  *RADIOLOGY REPORT*  Clinical Data: Left-sided chest pain.  CHEST - 2 VIEW  Comparison: Chest x-ray 10/09/2011.  Findings: Linear opacity in the left mid lung likely represents subsegmental atelectasis.  No consolidative airspace disease.  No pleural effusions.  No definite suspicious appearing pulmonary nodules or masses.  No pneumothorax.   Pulmonary vasculature and the cardiomediastinal silhouette are within normal limits.  IMPRESSION: 1.  Minimal subsegmental atelectasis in the left mid lung.  No other evidence to suggest acute cardiopulmonary disease.  Original Report Authenticated By: Florencia Reasons, M.D.      Date: 05/16/2012  Rate:75 regular  Rhythm: normal sinus rhythm  QRS Axis: normal  Intervals: normal  ST/T Wave abnormalities: normal  Conduction Disutrbances:none  Narrative Interpretation:   Old EKG Reviewed: unchanged    MDM  This is a 61 year old woman with past medical history significant for diabetes, hypertension, and congestive heart failure, who presents with left chest pain for 2 days. The patient has been to the ED with multiple episodes of similar pains and currently using a nitroglycerin patch and medical therapy with artovastatin, Aspirin,and Irbestartan. She has has been positive for by cardiology and a recent Myoview stress test showed some lateral wall ischemic changes on exercise. EKG, troponins, and chest x-ray at this point, are all negative. This left chest pain is most likely due to chronic unstable angina. She'll be treated with IV morphine, and aspirin. Consult with cardiology will be initiated.

## 2012-05-16 NOTE — ED Provider Notes (Signed)
  Physical Exam  BP 141/77  Pulse 68  Temp 98.3 F (36.8 C) (Oral)  Resp 10  SpO2 98%  LMP 07/12/1984  Physical Exam  Cardiovascular: Normal rate and regular rhythm.   Pulmonary/Chest: Breath sounds normal.       Palpation of right, anterior chest wall did cause  the patient to wince, but she denied worsening of the pain with palpation.  Abdominal: Soft. There is no tenderness.    ED Course  Procedures  6:00 PM - Pt reassessed.  Patient still complaining of right sided pain.  It seems pain may be reproducible with examination, but patient denies worsening with palpation.  She says she was on omeprazole once, but I see no record of this.  GI cocktail ordered.  7:26 PM - Pt reassessed.  Pain is still present but not as strong as when she presented.  She says the GI cocktail made her throat numb but did not address the pain. Hemodynamically stable.  Troponin negative x3.   MDM 1.   Chest pain:  Cardiac ischemia is ruled out.  Troponin has been negative thrice and EKG was negative for acute changes.  The patient has a history of inducible ischemia on myoview, but the pain she is having now is non-exertional.  Unstable angina is a possibility but less likely given the findings here.  She says she has had reflux in the past and has been on omeprazole before, but there is no record of this now.  GI cocktail here did not address the pain per the patient.  CXR is negative.  I think the most likely etiologies right now are costochondritis or GERD.  I have messaged the IM clinic to call the patient tomorrow and schedule follow up.  Lollie Sails, MD 05/16/12 2211

## 2012-05-16 NOTE — ED Notes (Signed)
Pt ambulated with a  Steady gait; VSS; A&Ox3; no signs of distress; respirations even and unlabored; skin warm and dry; no questions at this time.  

## 2012-05-16 NOTE — ED Provider Notes (Signed)
Patient seen and evaluated with resident. Pertinent history and physical examination performed. Agree with initial assessment, evaluation and treatment initiated by resident. Face-to-face examination and review of evaluation findings were performed.  History includes-She has had ongoing chest pain, for 5 days and is using nitroglycerin frequently for it. She has had frequent episodes like this for 10 years. She sees cardiology, actively and is treated without interventions recently.   Physical Includes- she is alert, cooperative, anxious. She is not in respiratory distress and does not appear to be in pain  EKG-   Date: 05/16/2012  Rate: 74  Rhythm: normal sinus rhythm  QRS Axis: normal  Intervals: normal  ST/T Wave abnormalities: normal  Conduction Disutrbances:none  Narrative Interpretation:   Old EKG Reviewed: changes noted- No TWA today   Disposition- Home with usual medication    Flint Melter, MD 05/16/12 [phone]

## 2012-05-17 ENCOUNTER — Ambulatory Visit (INDEPENDENT_AMBULATORY_CARE_PROVIDER_SITE_OTHER): Payer: Medicaid Other | Admitting: Internal Medicine

## 2012-05-17 ENCOUNTER — Encounter: Payer: Self-pay | Admitting: Internal Medicine

## 2012-05-17 VITALS — BP 120/70 | HR 68 | Temp 97.2°F | Ht 61.0 in | Wt 216.0 lb

## 2012-05-17 DIAGNOSIS — R079 Chest pain, unspecified: Secondary | ICD-10-CM

## 2012-05-17 DIAGNOSIS — E119 Type 2 diabetes mellitus without complications: Secondary | ICD-10-CM

## 2012-05-17 DIAGNOSIS — I10 Essential (primary) hypertension: Secondary | ICD-10-CM

## 2012-05-17 LAB — GLUCOSE, CAPILLARY: Glucose-Capillary: 161 mg/dL — ABNORMAL HIGH (ref 70–99)

## 2012-05-17 NOTE — Progress Notes (Signed)
Subjective:   Patient ID: [name] female   DOB: July 09, 1951 61 y.o.   MRN: [id number]  HPI: [name] is a 61 y.o. woman with PMH of CAD with Apical ischemia on Myoview and nonobstructive  Diseases from cath in 2007, HTN, DM type II, HLD, OSA with CPAP, and obesity, who presents to the clinic for ED follow up.  Patient presented to the ED with chest pain. The pain is mainly on the left side of her chest and it is recurrent with a severity of 10 /10 when is worst and 6/10 when it comes down. She took about 8 tablets of nitroglycerin with recurrent chest pain, but she did not get any improvement. She describes the pain as sharp and nonradiating. She has no palpitations or diaphoresis. Her chest pain does not change with activity. However, she complains of nausea; no vomiting. She denies a history of a cough, cold or fever.  She has had negative cardiac workup with 3 negative troponin and unremarkable EKG and physical exam in ED.   She reports that she has had chronic similar chest pain for past few years, which was evaluated by her Cardiologist Dr. Antoine Poche in the past.  She is noted to have multiple admissions for chest pain evaluation almost yearly with negative workups.  Her last hospitalization is noted in Jan 2013 with negative workups.  She is very anxious during the interview and frustrated over her chronic chest pain. She reports that she would have chest pain when she is emotional and anxious.   Past Medical History  Diagnosis Date  . Chest pain 11/07    cath 11/07 : LAD luminal irregularities, ramus intermediate with superior branch 70% stenosis, circumflex 50% stenosis, marginal 50% stenosis, well preserved ejection fraction. Stress test Jan 2013 : suggestion of lateral wall ischemia, likely artifact  . Hypertension     Requires 5 drug therapy and still difficult to control.  Marland Kitchen GERD (gastroesophageal reflux disease)   . Hyperlipidemia     On daily statin  . Obesity       Morbid. HAs worked with Lupita Leash T previously.  . Colonic polyp 1995    Colonoscopies 1994, 2000, and 02/02/2011. Last had 9 polyps - Tubular adenoma and tubulovillous was the pathology results without high grade dysplasia  . OSA (obstructive sleep apnea) 02/16/2007    Moderate AHI 35.6, O2 sat decreased to 65%, CPAP titration to 16 with AHI of 3.6.  Marland Kitchen Chronic pain     MR 08/07/10 :  Spondylosis L4-5 with B foraminal narrowing and L4 nerve root enchroachment B.  . Seasonal allergies     seasonal  . Iron deficiency anemia     Ferritin was 12 in 2012. on FeSo4. Has had colon and EGD 02/02/11 that showed mild gastritis and colon polyps.  . Chronic venous insufficiency 2012    Has had full W/U incl ECHO, LFT's, Cr, Umicro, TSH, BNP. Symptomatic treatment.  . Diastolic CHF, chronic     NL EF by echo 01/2012, Gr I dd  . Arthritis     knees  . 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 S/p lumpectomy and radiation.  . Diabetes mellitus type II     Insulin dependent. Has worked with Lupita Leash R previously.   Current Outpatient Prescriptions  Medication Sig Dispense Refill  . amLODipine (NORVASC) 10 MG tablet Take 10 mg by mouth daily.      Marland Kitchen aspirin  EC 81 MG tablet Take 81 mg by mouth daily.      Marland Kitchen atorvastatin (LIPITOR) 80 MG tablet Take 80 mg by mouth daily.      Tyrone Schimke, Vaccinium myrtillus, (BILBERRY PO) Take 1,000 mg by mouth daily.        Marland Kitchen BLACK COHOSH PO Take 1 capsule by mouth daily.       . Cholecalciferol (VITAMIN D3) 400 UNITS CAPS Take 400 Int'l Units by mouth at bedtime.        . cloNIDine (CATAPRES) 0.2 MG tablet Take 0.2 mg by mouth 2 (two) times daily.      Marland Kitchen co-enzyme Q-10 50 MG capsule Take 50 mg by mouth daily.      Marland Kitchen ibuprofen (ADVIL,MOTRIN) 800 MG tablet Take 800 mg by mouth every 8 (eight) hours as needed. For pain      . insulin NPH-insulin regular (NOVOLIN 70/30) (70-30) 100 UNIT/ML injection Inject 25-38 Units into the skin 2  (two) times daily with a meal. 38 units SQ in morning before breakfast, 25 units SQ in evening before supper      . irbesartan (AVAPRO) 150 MG tablet Take 75 mg by mouth daily.      . IRON PO Take 27 mg by mouth daily.      Marland Kitchen loratadine (CLARITIN) 10 MG tablet Take 10 mg by mouth 2 (two) times daily as needed. For allergies       . LORazepam (ATIVAN) 1 MG tablet Take 1 mg by mouth at bedtime as needed. For anxiety or sleep      . metFORMIN (GLUCOPHAGE) 1000 MG tablet Take 1,000 mg by mouth 2 (two) times daily with a meal.      . metoprolol succinate (TOPROL-XL) 100 MG 24 hr tablet Take 100 mg by mouth daily. Take with or immediately following a meal.      . nitroGLYCERIN (NITRO-DUR) 0.4 mg/hr Place 1 patch (0.4 mg total) onto the skin daily.  30 patch  2  . nitroGLYCERIN (NITROSTAT) 0.4 MG SL tablet Place 1 tablet (0.4 mg total) under the tongue every 5 (five) minutes as needed for chest pain.  25 tablet  3  . Olopatadine HCl (PATADAY) 0.2 % SOLN Place 1 drop into both eyes daily as needed. For allergies      . polyethylene glycol powder (GLYCOLAX/MIRALAX) powder Take 17 g by mouth daily as needed. For constipation      . potassium chloride SA (K-DUR,KLOR-CON) 20 MEQ tablet Take 20 mEq by mouth 2 (two) times daily.      . Red Yeast Rice 600 MG TABS Take by mouth daily.      . Tamsulosin HCl (FLOMAX) 0.4 MG CAPS Take 0.4 mg by mouth at bedtime.   30 capsule  11  . Thiamine HCl (VITAMIN B-1) 250 MG tablet Take 250 mg by mouth daily.      . vitamin B-12 (CYANOCOBALAMIN) 500 MCG tablet Take 500 mcg by mouth daily.        Marland Kitchen gabapentin (NEURONTIN) 300 MG capsule Take 300 mg by mouth 2 (two) times daily.       No current facility-administered medications for this visit.   Facility-Administered Medications Ordered in Other Visits  Medication Dose Route Frequency Provider Last Rate Last Dose  . 0.9 %  sodium chloride infusion   Intravenous Once Dow Adolph, MD 15 mL/hr at 05/16/12 1707 1,000 mL at  05/16/12 1707  . aspirin EC tablet 162 mg  162 mg Oral  Once Dow Adolph, MD   162 mg at 05/16/12 1703  . gi cocktail (Maalox,Lidocaine,Donnatal)  30 mL Oral Once Lollie Sails, MD   30 mL at 05/16/12 1819  . ketorolac (TORADOL) 30 MG/ML injection 30 mg  30 mg Intravenous Once Derwood Kaplan, MD   30 mg at 05/16/12 1724  . morphine 4 MG/ML injection 4 mg  4 mg Intravenous Once Dow Adolph, MD   4 mg at 05/16/12 1706  . ondansetron (ZOFRAN) injection 4 mg  4 mg Intravenous Once Dow Adolph, MD   4 mg at 05/16/12 1706   Family History  Problem Relation Age of Onset  . Depression Daughter 35  . Osteoarthritis Maternal Grandmother   . Lung cancer Mother   . Diabetes Father   . Breast cancer Sister   . Pulmonary embolism Brother   . Colon cancer Neg Hx    History   Social History  . Marital Status: Divorced    Spouse Name: N/A    Number of Children: N/A  . Years of Education: N/A   Social History Main Topics  . Smoking status: Never Smoker   . Smokeless tobacco: Never Used  . Alcohol Use: No  . Drug Use: No  . Sexually Active: None   Other Topics Concern  . None   Social History Narrative   Works at [employer] par time. Single, lives by self.   Review of Systems: Review of Systems:  Constitutional:  Denies fever, chills, diaphoresis, appetite change and fatigue.   HEENT:  Denies congestion, sore throat, rhinorrhea, sneezing, mouth sores, trouble swallowing, neck pain   Respiratory:  Denies SOB, DOE, cough, and wheezing.   Cardiovascular:  Denies palpitations and leg swelling. Positive for chest pain  Gastrointestinal:  Denies nausea, vomiting, abdominal pain, diarrhea, constipation, blood in stool and abdominal distention.   Genitourinary:  Denies dysuria, urgency, frequency, hematuria, flank pain and difficulty urinating.   Musculoskeletal:  Denies myalgias, back pain, joint swelling, arthralgias and gait problem.   Skin:  Denies pallor, rash and  wound.   Neurological:  Denies dizziness, seizures, syncope, weakness, light-headedness, numbness and headaches.    .   Objective:  Physical Exam: Filed Vitals:   05/17/12 1346  BP: 120/70  Pulse: 68  Temp: 97.2 F (36.2 C)  TempSrc: Oral  Height: 5\' 1"  (1.549 m)  Weight: 216 lb (97.977 kg)  SpO2: 99%   General: morbid obesity  Head: normocephalic and atraumatic.  Eyes: vision grossly intact, pupils equal, pupils round, pupils reactive to light, no injection and anicteric.  Mouth: pharynx pink and moist, no erythema, and no exudates.  Neck: supple, full ROM, no thyromegaly, no JVD, and no carotid bruits.  Lungs: normal respiratory effort, no accessory muscle use, normal breath sounds, no crackles, and no wheezes. Heart: normal rate, regular rhythm, no murmur, no gallop, and no rub.  Abdomen: soft, non-tender, normal bowel sounds, no distention, no guarding, no rebound tenderness, no hepatomegaly, and no splenomegaly.  Msk: no joint swelling, no joint warmth, and no redness over joints.  Pulses: 2+ DP/PT pulses bilaterally Extremities: No cyanosis, clubbing, edema Neurologic: alert & oriented X3, cranial nerves II-XII intact, strength normal in all extremities, sensation intact to light touch, and gait normal.  Skin: turgor normal and no rashes.  Psych: Oriented X3, memory intact for recent and remote, normally interactive, good eye contact, not anxious appearing, and not depressed appearing.   Assessment & Plan:

## 2012-05-17 NOTE — Assessment & Plan Note (Addendum)
Patient presents to the clinic for chest pain follow up. History of multiple admissions for similar pain in the past. She has been followed by Dr. Antoine Poche for past few years. Last office visit is in May 2013.  Nonobstructive CAD in cath 2007. Patient does have multiple risk factors for CAD including HTN, DM, HLD and mild family history with her brother diagnosed with MI at age of 75. I think that she should be reevaluated by her cardiologist Dr. Antoine Poche for any further testing including ? Cath.   Patient is asymptomatic during the office visit today.  - will instruct her to see Dr. Antoine Poche in one week. - continue current regimen.

## 2012-05-17 NOTE — Patient Instructions (Addendum)
1. Continue your current medications 2. Follow up in one month 3. See Dr. Antoine Poche in one week.

## 2012-05-18 NOTE — Assessment & Plan Note (Signed)
Well controlled. Last A1C 7.2 in July,2013.  - continue current regimen

## 2012-05-18 NOTE — ED Provider Notes (Signed)
I saw and evaluated the patient, reviewed the resident's note and I agree with the findings and plan.  Derwood Kaplan, MD 05/18/12 1705

## 2012-05-18 NOTE — Assessment & Plan Note (Signed)
Well controlled BP  - continue current regimen

## 2012-06-06 ENCOUNTER — Encounter: Payer: Self-pay | Admitting: Internal Medicine

## 2012-06-06 ENCOUNTER — Ambulatory Visit (INDEPENDENT_AMBULATORY_CARE_PROVIDER_SITE_OTHER): Payer: Medicaid Other | Admitting: Internal Medicine

## 2012-06-06 VITALS — BP 120/63 | HR 74 | Temp 97.7°F | Ht 61.0 in | Wt 233.6 lb

## 2012-06-06 DIAGNOSIS — G8929 Other chronic pain: Secondary | ICD-10-CM

## 2012-06-06 DIAGNOSIS — E669 Obesity, unspecified: Secondary | ICD-10-CM

## 2012-06-06 DIAGNOSIS — I2510 Atherosclerotic heart disease of native coronary artery without angina pectoris: Secondary | ICD-10-CM

## 2012-06-06 DIAGNOSIS — E785 Hyperlipidemia, unspecified: Secondary | ICD-10-CM

## 2012-06-06 DIAGNOSIS — I10 Essential (primary) hypertension: Secondary | ICD-10-CM

## 2012-06-06 DIAGNOSIS — E119 Type 2 diabetes mellitus without complications: Secondary | ICD-10-CM

## 2012-06-06 LAB — GLUCOSE, CAPILLARY: Glucose-Capillary: 144 mg/dL — ABNORMAL HIGH (ref 70–99)

## 2012-06-06 NOTE — Progress Notes (Signed)
  Subjective:    Patient ID: [name], female    DOB: May 30, 1951, 61 y.o.   MRN: [id number]  HPI  Please see the A&P for the status of the pt's chronic medical problems.  Review of Systems  Constitutional: Negative for activity change, appetite change and unexpected weight change.  HENT: Negative for congestion, sneezing and sinus pressure.   Eyes: Negative for itching.  Respiratory: Positive for chest tightness.   Cardiovascular: Positive for chest pain and leg swelling.  Gastrointestinal: Positive for abdominal pain.  Musculoskeletal: Positive for back pain, arthralgias and gait problem.       Objective:   Physical Exam  Constitutional: She is oriented to person, place, and time. Vital signs are normal. She appears well-developed and well-nourished. She does not have a sickly appearance. She does not appear ill. No distress.  HENT:  Head: Normocephalic and atraumatic.  Right Ear: External ear normal.  Left Ear: External ear normal.  Nose: Nose normal.  Eyes: Conjunctivae are normal.  Neck: Normal range of motion. Neck supple. No tracheal deviation present. No thyromegaly present.  Cardiovascular: Normal rate, regular rhythm and normal heart sounds.   Pulmonary/Chest: Effort normal.  Musculoskeletal: Normal range of motion. She exhibits edema and tenderness.  Lymphadenopathy:    She has no cervical adenopathy.  Neurological: She is alert and oriented to person, place, and time.  Skin: Skin is warm and dry. No rash noted. No erythema. No pallor.  Psychiatric: She has a normal mood and affect. Her behavior is normal. Judgment and thought content normal.          Assessment & Plan:

## 2012-06-06 NOTE — Patient Instructions (Signed)
I filled out the form to get diabetic inserts. Please see me in November.

## 2012-06-07 LAB — MICROALBUMIN / CREATININE URINE RATIO
Creatinine, Urine: 148.9 mg/dL
Microalb Creat Ratio: 5.6 mg/g (ref 0.0–30.0)

## 2012-06-07 NOTE — Assessment & Plan Note (Signed)
BP Readings from Last 3 Encounters:  06/06/12 120/63  05/17/12 120/70  05/16/12 157/67   Her BP is excellent today. Cont meds

## 2012-06-07 NOTE — Assessment & Plan Note (Addendum)
Her last 3 A1C have trended down from 8.6. Her last was 7.2 in July 2013. Her AM CBG's are between 121 and 169. Her dinner are btw 121 and 145. Her bedtime are between 142 and 170. These values are over 2 weeks. She is on 38 units in AM and 28 units in PM. I did not increase her insulin today as her A1C is on a downward trend. Will check A1C and log next visit and if still not at goal, will escalate insulin, likely her PM dose to get her AM sugars better.   UTD on all outcome measures.  I filled out form for her to get diabetic inserts. She has had these before and they helped but wore out. She has diabetic neuropathy - tingling in her feet but has good monofilament sensation.

## 2012-06-07 NOTE — Assessment & Plan Note (Addendum)
[name] was seen by sports med who felt that her R leg pain was c/w trochanteric bursitis and performed a steroid injection that unfortunately didn't help her pain. X Rays were then performed that showed mild degenerative changes of the R hip and she was sent to ortho. I do not have the records from Ortho but pt said more Xrays were done and she was told the the problem was her lower back and that ortho would do a steroid injection (pt believes was told injection into hip) but that ortho could not guarantee that it would help. Pt decided not to undergo further tx.  She uses Motrin and IBU. The IBU upsets her stomach which is why she alternates the meds. She does take these with food. She also uses topical tx - mustard rubs and menthol rubs. All of these ease her pain a bit but she is still in sig pain. Able to cont to cook and clean. Has to skip church occasionally. Has seen PT in 2011 and still doing the exercises but they hurt more. A heating pad eases the pain a bit and walking makes it worse.   She requested I fill out the forms for a back brace from [employer] - a company that [employer] and medicare pts. I explained that I was not comfortable filling out this form as these companies are only out to make money off the pts and taxpayers. I asked that she see PT once for an eval for back brace and if they feel that she would benefit, then I would make certain that she received a brace.  I will get ortho records so that I can then assist her in her treatment decisions.

## 2012-06-07 NOTE — Assessment & Plan Note (Signed)
Asked if she could take an OTC supplement (Chromium maybe) for weight loss and wanted my approval. I explained that these supplements are not regulated by the FDA and therefore I have no idea what exactly is in the pill and what it might do or interact with. I offered to refer her to [facility] as they also have a nutritionist but she declined.

## 2012-06-07 NOTE — Assessment & Plan Note (Signed)
She was changed to NTG patch but still cont to have freq angina that is distressing to her. Has seen Cards who doesn't think this is ischemic related. I tried her on a PPI for at least 6 weeks without improvement. TCA's have helped with non-cardiac CP per MKSAP 15 but she is on such a long list of meds, I would hate to add even more meds. I think behavior modification might be useful here but will need to proceed lightly so as not to disregard her sxs. I had wanted to get [facility] involved as they have mental health resources for medicaid pts but she wasn't too interested in that idea and I asked her to think about it and to let me know at her next appt.

## 2012-06-07 NOTE — Assessment & Plan Note (Signed)
LDL 79 in Jan 2013. Well controlled on statin.

## 2012-06-12 ENCOUNTER — Encounter: Payer: Self-pay | Admitting: *Deleted

## 2012-06-26 ENCOUNTER — Ambulatory Visit: Payer: Medicaid Other | Attending: Internal Medicine | Admitting: Physical Therapy

## 2012-06-26 DIAGNOSIS — M2560 Stiffness of unspecified joint, not elsewhere classified: Secondary | ICD-10-CM | POA: Insufficient documentation

## 2012-06-26 DIAGNOSIS — IMO0001 Reserved for inherently not codable concepts without codable children: Secondary | ICD-10-CM | POA: Insufficient documentation

## 2012-06-26 DIAGNOSIS — R293 Abnormal posture: Secondary | ICD-10-CM | POA: Insufficient documentation

## 2012-06-26 DIAGNOSIS — M2550 Pain in unspecified joint: Secondary | ICD-10-CM | POA: Insufficient documentation

## 2012-06-26 DIAGNOSIS — M6281 Muscle weakness (generalized): Secondary | ICD-10-CM | POA: Insufficient documentation

## 2012-07-04 ENCOUNTER — Encounter: Payer: Medicaid Other | Admitting: Physical Therapy

## 2012-07-07 ENCOUNTER — Other Ambulatory Visit: Payer: Self-pay | Admitting: Internal Medicine

## 2012-07-11 ENCOUNTER — Ambulatory Visit: Payer: Medicaid Other | Attending: Internal Medicine | Admitting: Physical Therapy

## 2012-07-11 DIAGNOSIS — IMO0001 Reserved for inherently not codable concepts without codable children: Secondary | ICD-10-CM | POA: Insufficient documentation

## 2012-07-11 DIAGNOSIS — R293 Abnormal posture: Secondary | ICD-10-CM | POA: Insufficient documentation

## 2012-07-11 DIAGNOSIS — M2560 Stiffness of unspecified joint, not elsewhere classified: Secondary | ICD-10-CM | POA: Insufficient documentation

## 2012-07-11 DIAGNOSIS — M2550 Pain in unspecified joint: Secondary | ICD-10-CM | POA: Insufficient documentation

## 2012-07-11 DIAGNOSIS — M6281 Muscle weakness (generalized): Secondary | ICD-10-CM | POA: Insufficient documentation

## 2012-07-18 ENCOUNTER — Ambulatory Visit: Payer: Medicaid Other | Admitting: Physical Therapy

## 2012-07-26 ENCOUNTER — Encounter: Payer: Medicaid Other | Admitting: Rehabilitation

## 2012-07-27 ENCOUNTER — Ambulatory Visit: Payer: Medicaid Other | Admitting: Physical Therapy

## 2012-08-01 ENCOUNTER — Ambulatory Visit: Payer: Medicaid Other | Admitting: Internal Medicine

## 2012-08-05 ENCOUNTER — Other Ambulatory Visit: Payer: Self-pay | Admitting: Nurse Practitioner

## 2012-08-08 ENCOUNTER — Ambulatory Visit (INDEPENDENT_AMBULATORY_CARE_PROVIDER_SITE_OTHER): Payer: Medicaid Other | Admitting: Internal Medicine

## 2012-08-08 ENCOUNTER — Encounter: Payer: Self-pay | Admitting: Internal Medicine

## 2012-08-08 VITALS — BP 123/68 | HR 99 | Temp 97.2°F | Wt 220.3 lb

## 2012-08-08 DIAGNOSIS — Z79899 Other long term (current) drug therapy: Secondary | ICD-10-CM

## 2012-08-08 DIAGNOSIS — G629 Polyneuropathy, unspecified: Secondary | ICD-10-CM

## 2012-08-08 DIAGNOSIS — E1142 Type 2 diabetes mellitus with diabetic polyneuropathy: Secondary | ICD-10-CM

## 2012-08-08 DIAGNOSIS — G5600 Carpal tunnel syndrome, unspecified upper limb: Secondary | ICD-10-CM

## 2012-08-08 DIAGNOSIS — G4733 Obstructive sleep apnea (adult) (pediatric): Secondary | ICD-10-CM

## 2012-08-08 DIAGNOSIS — E1149 Type 2 diabetes mellitus with other diabetic neurological complication: Secondary | ICD-10-CM

## 2012-08-08 DIAGNOSIS — I10 Essential (primary) hypertension: Secondary | ICD-10-CM

## 2012-08-08 DIAGNOSIS — I872 Venous insufficiency (chronic) (peripheral): Secondary | ICD-10-CM

## 2012-08-08 DIAGNOSIS — I2510 Atherosclerotic heart disease of native coronary artery without angina pectoris: Secondary | ICD-10-CM

## 2012-08-08 DIAGNOSIS — E785 Hyperlipidemia, unspecified: Secondary | ICD-10-CM

## 2012-08-08 DIAGNOSIS — E1140 Type 2 diabetes mellitus with diabetic neuropathy, unspecified: Secondary | ICD-10-CM

## 2012-08-08 DIAGNOSIS — G8929 Other chronic pain: Secondary | ICD-10-CM

## 2012-08-08 DIAGNOSIS — Z0000 Encounter for general adult medical examination without abnormal findings: Secondary | ICD-10-CM

## 2012-08-08 DIAGNOSIS — E669 Obesity, unspecified: Secondary | ICD-10-CM

## 2012-08-08 LAB — GLUCOSE, CAPILLARY: Glucose-Capillary: 132 mg/dL — ABNORMAL HIGH (ref 70–99)

## 2012-08-08 MED ORDER — TRAMADOL HCL 50 MG PO TABS: 50.0000 mg | ORAL_TABLET | Freq: Four times a day (QID) | ORAL | Status: DC | PRN

## 2012-08-08 MED ORDER — POTASSIUM CHLORIDE CRYS ER 20 MEQ PO TBCR: 20.0000 meq | EXTENDED_RELEASE_TABLET | Freq: Two times a day (BID) | ORAL | Status: DC

## 2012-08-08 NOTE — Patient Instructions (Signed)
Take Ibuprofen 800 mg twice a day. Stop the Motrin Try the tramadol up to twice a day. Let me know if you have any problems with it. Please see me in 3 months.

## 2012-08-08 NOTE — Assessment & Plan Note (Signed)
Off diuretic. Still with LE edema B and symmetric.

## 2012-08-08 NOTE — Assessment & Plan Note (Signed)
Weight has been stable for last 5 years. So even though her BMI is 44, I think a stable weight is something to be proud of and told her this. She does want to decrease her weight which I applaud but will be challenging in her with her pain issues.

## 2012-08-08 NOTE — Assessment & Plan Note (Signed)
Cont to refuse influ and zostavax. UTD on all other HM. Pt states got letter that it is time to do MMG.

## 2012-08-08 NOTE — Assessment & Plan Note (Signed)
Cont to see Dr Jens Som. Gets pain from L post thoracic around and under breast when she moves and it grabs her. This is likely MS pain. I reviewed her lateral CXR and I could see no compression fx. She states she got screened for osteoporosis several yrs ago at a health fair and OK.   For CAD - on ASA, BB, statin, ARB

## 2012-08-08 NOTE — Assessment & Plan Note (Signed)
Uses wrist braces B each night. Gabapentin doesn't help the CTS.

## 2012-08-08 NOTE — Assessment & Plan Note (Addendum)
She was seen by PT for her back and leg pain. They agreed that a back brace would not be beneficial. They did recommend a TENS unit and I will fax Rx over for a TENS unit.  Her L leg goes out on her. It "draws up" on her and lasts 5-8 min and occurs several times a day. Her daughter or grandson has to help straighten it out. She gets a lot of pain putting weight on it when she stands up. Uses cane even on good day as rec by PT. Was told that her R leg was shorter - PT gave her heel inserts that she uses to even the leg length but didn't use them today.   Has seen at least 2 ortho and sports med and has always been told that there is no surgical intervention. Sports Med has tried steroid injections. I do not have ortho notes.   She is taking IBU 800 BID and motrin QID. It upsets her stomach even though takes with food. Also causes diarrhea. Tylenol doesn't help. Steroid injections have not provided help. She is trying to stay active and likes to walk although it is challenging .She has realistic expectations - knows cannot take away pain but can decrease it a bit so that she can do her ADL's, stuff around the house, get out to stores, and have a quality of life. Therefore, will ask that she cont the IBU 800 BID, stop the motrin, and start tramadol. Gave 60 per month and will have to titrate quantity based on need. Will also Rx TENS unit.

## 2012-08-08 NOTE — Assessment & Plan Note (Signed)
BP is great! On norvasc, clonidine, metoprolol, avapro

## 2012-08-08 NOTE — Assessment & Plan Note (Signed)
On gabapentin.  

## 2012-08-08 NOTE — Assessment & Plan Note (Signed)
Continues to use CPAP. °

## 2012-08-08 NOTE — Assessment & Plan Note (Addendum)
A1C is 7.3 today. She checks her CBG once a day either in AM or QHS. AM fastings are 126, 119, 130, 169 (pt did not have meter - readings from her recall). On max metformin and 70/30 38 units in AM and 28 units in PM. Occasional lows for her in 70's and she feels shaky and eats something sweet. I have filled out the form for her DM shoe inserts and she has been molded but apparently, medicaid will not pay for them??  I think an A1C < 8 is OK for her. She has all other RF (except weight) well controlled and I think the risks of tight control outweigh the benefits.

## 2012-08-08 NOTE — Progress Notes (Signed)
  Subjective:    Patient ID: [name], female    DOB: [birth year], 61 y.o.   MRN: [id number]  HPI  Please see the A&P for the status of the pt's chronic medical problems.   Review of Systems  Constitutional: Negative for activity change, appetite change and unexpected weight change.  HENT: Positive for dental problem. Negative for rhinorrhea and sneezing.   Eyes: Negative for discharge and itching.  Respiratory: Positive for shortness of breath and wheezing.   Cardiovascular: Positive for chest pain and leg swelling.  Gastrointestinal: Positive for diarrhea. Negative for abdominal pain.  Genitourinary: Negative for frequency and difficulty urinating.  Musculoskeletal: Positive for myalgias, back pain, arthralgias and gait problem.  Skin: Negative for color change and wound.  Neurological: Positive for weakness.  Psychiatric/Behavioral: Negative for sleep disturbance.       Objective:   Physical Exam  Constitutional: She is oriented to person, place, and time. She appears well-developed and well-nourished. She appears distressed.  HENT:  Head: Normocephalic and atraumatic.  Right Ear: External ear normal.  Left Ear: External ear normal.  Nose: Nose normal.  Mouth/Throat: Oropharynx is clear and moist.  Eyes: Conjunctivae normal and EOM are normal. Right eye exhibits no discharge. Left eye exhibits no discharge. No scleral icterus.  Cardiovascular: Normal rate, regular rhythm and normal heart sounds.  Exam reveals no gallop and no friction rub.   No murmur heard. Pulmonary/Chest: Effort normal and breath sounds normal.  Abdominal: Bowel sounds are normal.  Musculoskeletal: She exhibits edema. She exhibits no tenderness.  Neurological: She is alert and oriented to person, place, and time.  Skin: Skin is warm and dry. No rash noted. She is not diaphoretic. No erythema. No pallor.  Psychiatric: She has a normal mood and affect. Her behavior is normal. Judgment and thought  content normal.          Assessment & Plan:

## 2012-08-08 NOTE — Assessment & Plan Note (Signed)
LDL great. Needs rechecked in Jan 2014.

## 2012-10-09 ENCOUNTER — Other Ambulatory Visit: Payer: Self-pay | Admitting: Obstetrics and Gynecology

## 2012-10-09 ENCOUNTER — Other Ambulatory Visit: Payer: Self-pay | Admitting: Internal Medicine

## 2012-10-09 DIAGNOSIS — Z853 Personal history of malignant neoplasm of breast: Secondary | ICD-10-CM

## 2012-10-09 DIAGNOSIS — Z9889 Other specified postprocedural states: Secondary | ICD-10-CM

## 2012-10-10 ENCOUNTER — Encounter: Payer: Medicaid Other | Admitting: Internal Medicine

## 2012-10-12 ENCOUNTER — Encounter: Payer: Self-pay | Admitting: Internal Medicine

## 2012-10-12 ENCOUNTER — Ambulatory Visit (INDEPENDENT_AMBULATORY_CARE_PROVIDER_SITE_OTHER): Payer: Medicaid Other | Admitting: Internal Medicine

## 2012-10-12 ENCOUNTER — Other Ambulatory Visit: Payer: Self-pay | Admitting: Internal Medicine

## 2012-10-12 ENCOUNTER — Ambulatory Visit
Admission: RE | Admit: 2012-10-12 | Discharge: 2012-10-12 | Disposition: A | Discharge status: Home / Self Care | Payer: Medicaid Other | Source: Ambulatory Visit | Attending: Internal Medicine | Admitting: Internal Medicine

## 2012-10-12 VITALS — BP 123/76 | HR 72 | Temp 98.1°F | Ht 61.0 in | Wt 218.0 lb

## 2012-10-12 DIAGNOSIS — E669 Obesity, unspecified: Secondary | ICD-10-CM

## 2012-10-12 DIAGNOSIS — G8929 Other chronic pain: Secondary | ICD-10-CM

## 2012-10-12 DIAGNOSIS — Z9889 Other specified postprocedural states: Secondary | ICD-10-CM

## 2012-10-12 DIAGNOSIS — I10 Essential (primary) hypertension: Secondary | ICD-10-CM

## 2012-10-12 DIAGNOSIS — E785 Hyperlipidemia, unspecified: Secondary | ICD-10-CM

## 2012-10-12 DIAGNOSIS — E1149 Type 2 diabetes mellitus with other diabetic neurological complication: Secondary | ICD-10-CM

## 2012-10-12 DIAGNOSIS — Z0000 Encounter for general adult medical examination without abnormal findings: Secondary | ICD-10-CM

## 2012-10-12 DIAGNOSIS — E1140 Type 2 diabetes mellitus with diabetic neuropathy, unspecified: Secondary | ICD-10-CM

## 2012-10-12 DIAGNOSIS — I2510 Atherosclerotic heart disease of native coronary artery without angina pectoris: Secondary | ICD-10-CM

## 2012-10-12 DIAGNOSIS — E1142 Type 2 diabetes mellitus with diabetic polyneuropathy: Secondary | ICD-10-CM

## 2012-10-12 DIAGNOSIS — Z853 Personal history of malignant neoplasm of breast: Secondary | ICD-10-CM

## 2012-10-12 DIAGNOSIS — G629 Polyneuropathy, unspecified: Secondary | ICD-10-CM

## 2012-10-12 LAB — LIPID PANEL
Cholesterol: 218 mg/dL — ABNORMAL HIGH (ref 0–200)
HDL: 54 mg/dL (ref >39)
Triglycerides: 127 mg/dL (ref <150)
VLDL: 25 mg/dL (ref 0–40)

## 2012-10-12 MED ORDER — PROMETHAZINE HCL 25 MG PO TABS: 25.0000 mg | ORAL_TABLET | Freq: Four times a day (QID) | ORAL | Status: DC | PRN

## 2012-10-13 ENCOUNTER — Other Ambulatory Visit: Payer: Self-pay | Admitting: Dietician

## 2012-10-13 ENCOUNTER — Encounter: Payer: Self-pay | Admitting: Internal Medicine

## 2012-10-13 DIAGNOSIS — E1140 Type 2 diabetes mellitus with diabetic neuropathy, unspecified: Secondary | ICD-10-CM

## 2012-10-13 MED ORDER — GLUCOSE BLOOD VI STRP: ORAL_STRIP | Status: DC

## 2012-10-13 MED ORDER — ACCU-CHEK FASTCLIX LANCETS MISC: 1.0000 | Freq: Three times a day (TID) | Status: DC

## 2012-10-13 NOTE — Assessment & Plan Note (Signed)
Weight remains stable over past 5 yrs but she is frustrated that she cannot lose weight. She does want to work with Tobey Bride so I will make that referral

## 2012-10-13 NOTE — Assessment & Plan Note (Signed)
Always refuses flu. Refused zostavax Need to discuss PAP next visit.

## 2012-10-13 NOTE — Assessment & Plan Note (Signed)
Lab Results  Component Value Date   HGBA1C 7.3 08/08/2012   HGBA1C 7.2 04/13/2012   HGBA1C 7.7 01/11/2012     Assessment:  Diabetes control: good control (HgbA1C at goal)  Progress toward A1C goal:  at goal  Comments: Has not been able to check CBG as her meter's testing supplies are not covered by medicaid. Would like to test TID so that she can keep her DM under great control.  Plan:  Medications:  continue current medications  Home glucose monitoring:   Frequency: once a day   Timing: before breakfast  Instruction/counseling given: discussed the need for weight loss  Educational resources provided: other (see comments) (None needed)  Self management tools provided: other (see comments) (Need to get glucse testing supplies that medicaid will cover)  Other plans: Will get [employer] assistance to get testing supplies covered by medicaid. Also appt with Lupita Leash in 1-2 months.

## 2012-10-13 NOTE — Progress Notes (Signed)
  Subjective:    Patient ID: [name], female    DOB: [birth year], 62 y.o.   MRN: [id number]  HPI  Please see the A&P for the status of the pt's chronic medical problems.  I had to cancel the appt on Tues the 7th. Pt was late to appt on the 9thth. She got vitals and labs on the 9th and I conducted a phone visit today on the 10th.  Review of Systems  Constitutional: Positive for fever and diaphoresis. Negative for unexpected weight change.  Gastrointestinal: Positive for nausea and vomiting.  Musculoskeletal: Positive for back pain and gait problem.       Objective:   Physical Exam  Nursing note and vitals reviewed.         Assessment & Plan:

## 2012-10-13 NOTE — Assessment & Plan Note (Signed)
Still on gabapentin

## 2012-10-13 NOTE — Patient Instructions (Signed)
This was a telephone visit so AVS not able to be provided to pt.

## 2012-10-13 NOTE — Assessment & Plan Note (Signed)
BP Readings from Last 3 Encounters:  10/12/12 123/76  08/08/12 123/68  06/06/12 120/63    Lab Results  Component Value Date   NA 141 05/16/2012   K 4.0 05/16/2012   CREATININE 0.77 05/16/2012    Assessment:  Blood pressure control: controlled  Progress toward BP goal:  at goal  Comments: BP great. On four drug tx - Norvasc, clonidine, metoprolol, and avapro.   Plan:  Medications:  continue current medications  Educational resources provided:    Self management tools provided: other (see comments) (None needed)  Other plans: Cont to follow BP

## 2012-10-13 NOTE — Telephone Encounter (Signed)
Thanks

## 2012-10-13 NOTE — Assessment & Plan Note (Signed)
She states she needs to set up appt with [facility].  On ASA, BB, statin - need to get better control of LDL.

## 2012-10-13 NOTE — Telephone Encounter (Signed)
Patient needs testing supplies. Does not appear that Previous rxs were not sent. (confirmed with [facility] they did not have them)

## 2012-10-13 NOTE — Assessment & Plan Note (Signed)
The TENS unit helps. She places it on her back. There was an issue with Medicaid thought that I need to get straitened out. She is still using the IBU but it upsets her stomach even if she takes it with food. The Tramadol also helps - usually takes 2 at once and only once daily. The pain is in back and goes down B legs. We reviewed her MRI findings from 2011 - seems consistent. She has seen Sports med & dx with trochanteric bursitis - steroid injection didn't help - and OA - sent to ortho but no records.   Will work out TENS unit Try again to get Ortho records Cont IBU and Tramadol

## 2012-10-13 NOTE — Assessment & Plan Note (Signed)
LDL increased from 79 to 139. Taking Lipitor 80. Missed last week 2/2 N / V. We discussed options - changing to Crestor vs rechecking in 2-4 months. Pt decided to cont Lipitor as is and recheck next visit.

## 2012-11-03 ENCOUNTER — Telehealth: Payer: Self-pay | Admitting: *Deleted
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
## 2012-11-03 NOTE — Telephone Encounter (Signed)
Pt called with c/o pain. Pt states she was having pain at night so used a heating pad (low setting) on rt hip.  She noted 2 blisters when she got up on 1/24. Then tuesday  the blisters burst so she used neosporin to area. The area now looks like a sore, bright red about the size of 25 cent piece. She noted small amount of pus and feels the area is not healing. Pt offered appointment for evaluation but she does not want to come in.  She has a scheduled appointment on 2/18. She wants advise on what else to do. Pt # [id number]

## 2012-11-03 NOTE — Telephone Encounter (Signed)
Pt can't come in today, scheduled for Monday.  Pt will go to [facility] if worse over weekend.

## 2012-11-06 ENCOUNTER — Encounter: Payer: Self-pay | Admitting: Internal Medicine

## 2012-11-06 ENCOUNTER — Ambulatory Visit (INDEPENDENT_AMBULATORY_CARE_PROVIDER_SITE_OTHER): Payer: Medicaid Other | Admitting: Internal Medicine

## 2012-11-06 VITALS — BP 135/71 | HR 70 | Temp 97.4°F | Ht 61.0 in | Wt 215.4 lb

## 2012-11-06 DIAGNOSIS — E1140 Type 2 diabetes mellitus with diabetic neuropathy, unspecified: Secondary | ICD-10-CM

## 2012-11-06 DIAGNOSIS — Z79899 Other long term (current) drug therapy: Secondary | ICD-10-CM

## 2012-11-06 DIAGNOSIS — E119 Type 2 diabetes mellitus without complications: Secondary | ICD-10-CM

## 2012-11-06 DIAGNOSIS — X19XXXA Contact with other heat and hot substances, initial encounter: Secondary | ICD-10-CM

## 2012-11-06 DIAGNOSIS — T300 Burn of unspecified body region, unspecified degree: Secondary | ICD-10-CM

## 2012-11-06 MED ORDER — BACITRACIN ZINC 500 UNIT/GM EX OINT: TOPICAL_OINTMENT | CUTANEOUS | Status: AC

## 2012-11-06 NOTE — Patient Instructions (Addendum)
-  Your Hemoglobin A1C today if 6.4% indicating that your diabetes is well controlled. Well done! -Reduce your intake of saturated fats to decrease your LDL, your "bad" cholesterol.  -You may continue using Aloe Vera for your wound or your may use Bacitracin (similar to neosporin) -If your wound becomes open, with odor, pus, blood, more pain, swelling, or if you develop a fever please call us or go to the ED if the clinic is not open.
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
## 2012-11-07 ENCOUNTER — Other Ambulatory Visit: Payer: Self-pay | Admitting: Internal Medicine

## 2012-11-07 DIAGNOSIS — T300 Burn of unspecified body region, unspecified degree: Secondary | ICD-10-CM | POA: Insufficient documentation

## 2012-11-07 NOTE — Assessment & Plan Note (Signed)
Lab Results  Component Value Date   HGBA1C 6.4 11/06/2012   HGBA1C 8.6* 10/09/2011   POCGLU 107 11/11/2010   CREATININE 0.77 05/16/2012   CREATININE 1.02 07/13/2011   MICROALBUR 0.84 06/06/2012   MICRALBCREAT 5.6 06/06/2012   CHOL 218* 10/12/2012   HDL 54 10/12/2012   TRIG 127 10/12/2012    Last eye exam and foot exam:    Component Value Date/Time   HMDIABEYEEXA no diabetic retinopathy; Visual acuity OD: 20/50;Visual acuity OS: 20/25; intraocular pressure OD:18;intraocular pressure OS: 18 02/26/2009   HMDIABFOOTEX done 08/26/2010    Assessment: Diabetes control: controlled Progress toward goals: at goal Barriers to meeting goals: no barriers identified  Plan: Diabetes treatment: continue current medications Refer to: none Instruction/counseling given: discussed diet
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
## 2012-11-21 ENCOUNTER — Ambulatory Visit: Payer: Medicaid Other | Admitting: Internal Medicine

## 2012-11-21 ENCOUNTER — Ambulatory Visit: Payer: Medicaid Other | Admitting: Dietician

## 2012-11-21 ENCOUNTER — Telehealth: Payer: Self-pay | Admitting: *Deleted

## 2012-11-21 NOTE — Telephone Encounter (Signed)
I called her last PM and she stated she wanted to keep the appt even though she had just been seen earlier this month. Thanks

## 2012-11-21 NOTE — Telephone Encounter (Signed)
Pt calls at [phone] and states she has decided to follow dr butchers advice and cancel her appt. She states she will call for problems

## 2012-12-04 ENCOUNTER — Other Ambulatory Visit: Payer: Self-pay | Admitting: Internal Medicine

## 2012-12-05 ENCOUNTER — Other Ambulatory Visit: Payer: Self-pay | Admitting: *Deleted

## 2012-12-05 MED ORDER — METOPROLOL SUCCINATE ER 100 MG PO TB24: 100.0000 mg | ORAL_TABLET | Freq: Every day | ORAL | Status: DC

## 2012-12-12 ENCOUNTER — Ambulatory Visit (INDEPENDENT_AMBULATORY_CARE_PROVIDER_SITE_OTHER): Payer: Medicaid Other | Admitting: Cardiology

## 2012-12-12 ENCOUNTER — Encounter: Payer: Self-pay | Admitting: Cardiology

## 2012-12-12 VITALS — BP 122/66 | HR 68 | Ht 61.0 in | Wt 216.2 lb

## 2012-12-12 DIAGNOSIS — I10 Essential (primary) hypertension: Secondary | ICD-10-CM | POA: Diagnosis not present

## 2012-12-12 DIAGNOSIS — I2510 Atherosclerotic heart disease of native coronary artery without angina pectoris: Secondary | ICD-10-CM | POA: Diagnosis not present

## 2012-12-12 NOTE — Progress Notes (Signed)
The patient was admitted recently for evaluation of CAD. Since I last saw her she did have one emergency room visit for chest pain. I reviewed this. Was thought to be nonanginal. He has otherwise done well. She does get chest pain periodically and takes nitroglycerin as needed. However, this has been a stable pattern. She is limited in activities but does do some walking. With this she denies any chest pressure, neck or arm discomfort. Her discomfort is not reproducible with activity. She's not describing palpitations, presyncope or syncope. She's had no weight gain or edema. Unfortunately she's had no weight loss.   Allergies  Allergen Reactions  . Lisinopril Other (See Comments)    Chronic cough secondary to ACE  . Oxycodone-Acetaminophen Hives and Itching    flushing    Current Outpatient Prescriptions  Medication Sig Dispense Refill  . ACCU-CHEK FASTCLIX LANCETS MISC 1 each by Does not apply route 3 (three) times daily before meals. Dx code- 250.00  100 each  11  . amLODipine (NORVASC) 10 MG tablet TAKE ONE TABLET BY MOUTH EVERY DAY  90 tablet  3  . aspirin EC 81 MG tablet Take 81 mg by mouth daily.      Marland Kitchen atorvastatin (LIPITOR) 80 MG tablet Take 80 mg by mouth daily.      . bacitracin ointment Apply to affected area daily  30 g  0  . Bilberry, Vaccinium myrtillus, (BILBERRY PO) Take 1,000 mg by mouth daily.        Marland Kitchen BLACK COHOSH PO Take 1 capsule by mouth daily.       . Cholecalciferol (VITAMIN D3) 400 UNITS CAPS Take 400 Int'l Units by mouth at bedtime.       . cloNIDine (CATAPRES) 0.2 MG tablet TAKE ONE TABLET BY MOUTH TWICE DAILY  60 tablet  10  . co-enzyme Q-10 50 MG capsule Take 50 mg by mouth daily.      Marland Kitchen gabapentin (NEURONTIN) 300 MG capsule TAKE ONE CAPSULE BY MOUTH TWICE DAILY  60 capsule  2  . glucose blood (ACCU-CHEK SMARTVIEW) test strip Check blood sugar up to 3 times daily  Dx code 250.00  100 each  11  . ibuprofen (ADVIL,MOTRIN) 800 MG tablet Take 800 mg by mouth  every 8 (eight) hours as needed. For pain      . insulin NPH-insulin regular (NOVOLIN 70/30) (70-30) 100 UNIT/ML injection Inject 25-38 Units into the skin 2 (two) times daily with a meal. 38 units SQ in morning before breakfast, 28 units SQ in evening before supper      . irbesartan (AVAPRO) 150 MG tablet Take 75 mg by mouth daily.      . irbesartan (AVAPRO) 150 MG tablet TAKE ONE TABLET BY MOUTH EVERY DAY  90 tablet  2  . IRON PO Take 27 mg by mouth daily.      Marland Kitchen loratadine (CLARITIN) 10 MG tablet Take 10 mg by mouth 2 (two) times daily as needed. For allergies       . LORazepam (ATIVAN) 1 MG tablet Take 1 mg by mouth at bedtime as needed. For anxiety or sleep      . metFORMIN (GLUCOPHAGE) 1000 MG tablet Take 1,000 mg by mouth 2 (two) times daily with a meal.      . metoprolol succinate (TOPROL-XL) 100 MG 24 hr tablet Take 1 tablet (100 mg total) by mouth daily. Take with or immediately following a meal.  90 tablet  3  . MINITRAN 0.4 MG/HR  APPLY ONE PATCH TOPICALLY EVERY DAY  30 each  1  . nitroGLYCERIN (NITROSTAT) 0.4 MG SL tablet Place 1 tablet (0.4 mg total) under the tongue every 5 (five) minutes as needed for chest pain.  25 tablet  3  . Olopatadine HCl (PATADAY) 0.2 % SOLN Place 1 drop into both eyes daily as needed. For allergies      . polyethylene glycol powder (GLYCOLAX/MIRALAX) powder Take 17 g by mouth daily as needed. For constipation      . potassium chloride SA (K-DUR,KLOR-CON) 20 MEQ tablet Take 1 tablet (20 mEq total) by mouth 2 (two) times daily.  30 tablet  11  . promethazine (PHENERGAN) 25 MG tablet Take 1 tablet (25 mg total) by mouth every 6 (six) hours as needed for nausea.  30 tablet  0  . Red Yeast Rice 600 MG TABS Take by mouth daily.      . Thiamine HCl (VITAMIN B-1) 250 MG tablet Take 250 mg by mouth daily.      . traMADol (ULTRAM) 50 MG tablet Take 1 tablet (50 mg total) by mouth every 6 (six) hours as needed for pain.  60 tablet  2   No current facility-administered  medications for this visit.    Past Medical History  Diagnosis Date  . Chest pain 11/07    cath 11/07 : LAD luminal irregularities, ramus intermediate with superior branch 70% stenosis, circumflex 50% stenosis, marginal 50% stenosis, well preserved ejection fraction. Stress test Jan 2013 : suggestion of lateral wall ischemia, likely artifact  . Hypertension     Requires 5 drug therapy and still difficult to control.  Marland Kitchen GERD (gastroesophageal reflux disease)   . Hyperlipidemia     On daily statin  . Obesity     Morbid. HAs worked with Lupita Leash T previously.  . Colonic polyp 1995    Colonoscopies 1994, 2000, and 02/02/2011. Last had 9 polyps - Tubular adenoma and tubulovillous was the pathology results without high grade dysplasia  . OSA (obstructive sleep apnea) 02/16/2007    Moderate AHI 35.6, O2 sat decreased to 65%, CPAP titration to 16 with AHI of 3.6.  Marland Kitchen Chronic pain     MR 08/07/10 :  Spondylosis L4-5 with B foraminal narrowing and L4 nerve root enchroachment B.  . Seasonal allergies     seasonal  . Iron deficiency anemia     Ferritin was 12 in 2012. on FeSo4. Has had colon and EGD 02/02/11 that showed mild gastritis and colon polyps.  . Chronic venous insufficiency 2012    Has had full W/U incl ECHO, LFT's, Cr, Umicro, TSH, BNP. Symptomatic treatment.  . Diastolic CHF, chronic     NL EF by echo 01/2012, Gr I dd  . Arthritis     knees  . 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 S/p lumpectomy and radiation.  . Diabetes mellitus type II     Insulin dependent. Has worked with Lupita Leash R previously.    Past Surgical History  Procedure Laterality Date  . Colonoscopy w/ polypectomy  1994, 2000, 2012  . Total abdominal hysterectomy  1995    2/2 uterine cancer  . Carotid dopplers  08/24/2007    Normal. Done 2/2 dizziness.  . Cardiac catheterization  2007    Non obstructing CAD - [facility]  . Breast lumpectomy w/ needle localization  2006     34 Chemo RX    ROS: As stated in the  HPI and negative for all other systems.   PHYSICAL EXAM BP 122/66  Pulse 68  Ht 5\' 1"  (1.549 m)  Wt 216 lb 3.2 oz (98.068 kg)  BMI 40.87 kg/m2  LMP 07/12/1984 GENERAL:  Well appearing HEENT:  Pupils equal round and reactive, fundi not visualized, oral mucosa unremarkable NECK:  No jugular venous distention, waveform within normal limits, carotid upstroke brisk and symmetric, no bruits, no thyromegaly LYMPHATICS:  No cervical, inguinal adenopathy LUNGS:  Clear to auscultation bilaterally BACK:  No CVA tenderness HEART:  PMI not displaced or sustained,S1 and S2 within normal limits, no S3, no S4, no clicks, no rubs, no murmurs ABD:  Flat, positive bowel sounds normal in frequency in pitch, no bruits, no rebound, no guarding, no midline pulsatile mass, no hepatomegaly, no splenomegaly EXT:  2 plus pulses throughout, no edema, no cyanosis no clubbing  EKG:  Sinus rhythm, rate 68, left axis deviation, no acute ST-T wave changes. 12/12/2012  ASSESSMENT AND PLAN   CAD:  She has had no new symptoms since a stress test last January. No further cardiovascular testing is suggested.  HTN:  Her blood pressure is well controlled. She will continue on the meds as listed.  HYPERLIPIDEMIA:  I reviewed her last lipids in her LDL was 139. However, she's on target dose statin. Her lipids have been controlled with or with diet and this regimen of medications. I will not make any change.  DIABETES:  Her hemoglobin A1c was 6.4. She will remain on the meds as listed.  OBESITY:  The patient understands the need to lose weight with diet and exercise.

## 2012-12-12 NOTE — Patient Instructions (Addendum)
The current medical regimen is effective;  continue present plan and medications.  Follow up in 1 year with Dr Hochrein.  You will receive a letter in the mail 2 months before you are due.  Please call us when you receive this letter to schedule your follow up appointment.  

## 2012-12-23 ENCOUNTER — Other Ambulatory Visit: Payer: Self-pay | Admitting: Cardiology

## 2012-12-26 NOTE — Telephone Encounter (Signed)
..   Requested Prescriptions   Pending Prescriptions Disp Refills  . NITRO-DUR 0.4 MG/HR [Pharmacy Med Name: NITRO-DUR 0.4MG /HR  DIS] 30 patch 3    Sig: APPLY ONE PATCH TOPICALLY EVERYDAY

## 2013-01-01 ENCOUNTER — Encounter: Payer: Self-pay | Admitting: Internal Medicine

## 2013-01-04 ENCOUNTER — Other Ambulatory Visit: Payer: Self-pay | Admitting: Internal Medicine

## 2013-01-06 ENCOUNTER — Encounter: Payer: Self-pay | Admitting: Internal Medicine

## 2013-01-06 DIAGNOSIS — R269 Unspecified abnormalities of gait and mobility: Secondary | ICD-10-CM | POA: Insufficient documentation

## 2013-01-08 ENCOUNTER — Ambulatory Visit (INDEPENDENT_AMBULATORY_CARE_PROVIDER_SITE_OTHER): Payer: Medicaid Other | Admitting: Internal Medicine

## 2013-01-08 ENCOUNTER — Encounter: Payer: Self-pay | Admitting: Dietician

## 2013-01-08 ENCOUNTER — Ambulatory Visit (INDEPENDENT_AMBULATORY_CARE_PROVIDER_SITE_OTHER): Payer: Medicaid Other | Admitting: Dietician

## 2013-01-08 VITALS — BP 127/63 | HR 66 | Temp 97.2°F | Ht 61.0 in | Wt 216.6 lb

## 2013-01-08 DIAGNOSIS — E1142 Type 2 diabetes mellitus with diabetic polyneuropathy: Secondary | ICD-10-CM

## 2013-01-08 DIAGNOSIS — E1140 Type 2 diabetes mellitus with diabetic neuropathy, unspecified: Secondary | ICD-10-CM

## 2013-01-08 DIAGNOSIS — E1149 Type 2 diabetes mellitus with other diabetic neurological complication: Secondary | ICD-10-CM

## 2013-01-08 DIAGNOSIS — G8929 Other chronic pain: Secondary | ICD-10-CM

## 2013-01-08 LAB — GLUCOSE, CAPILLARY: Glucose-Capillary: 101 mg/dL — ABNORMAL HIGH (ref 70–99)

## 2013-01-08 MED ORDER — INSULIN NPH ISOPHANE & REGULAR (70-30) 100 UNIT/ML ~~LOC~~ SUSP: SUBCUTANEOUS | Status: DC

## 2013-01-08 NOTE — Progress Notes (Signed)
Patient: [name]   MRN: [id number]  DOB: Apr 13, 1951  PCP: Burns Spain, MD   Subjective:    HPI: [name] is a 62 y.o. female with a PMHx as outlined below, who presented to clinic today for the following:  1) Right thigh pain - Patient describes a 1 year history of gradually worsening, constant, aching, sharp and shooting pain that is occurs from her right low back and radiates down lateral aspect of leg to foot.  She feels the pain especially over her right thigh, although it is occurring bilaterally She has already physical therapy last year, was recommended the TENS unit, which she has been using regularly. Was previously effective, but less so at this time. Currently, the pain is rated a 5/10 in severity. At its worst, the pain is rated a 10/10 in severity. Aggravated by painful to walk, sit, lie down, alleviated by nothing. States that she's previously been evaluated at [facility] orthopedics for consideration of steroid injection to her low back, however she previously declined. Now, she does prefer to proceed with this procedure given significant of her pain. Denies fevers, chills, perineal numbness, urinary or stool incontinence.   2) DM2, controlled -  Lab Results  Component Value Date   HGBA1C 6.4 11/06/2012   Patient checking blood sugars 1-2 times daily, before breakfast and if feeling poorly. Reports fasting blood sugars of 97-110s mg/dL. Currently taking Novolin 70/30 with typically 28 units BID (although prescribed 38 in AM and 28 in PM). She has been using it somewhat as a sliding scale dependent on how her blood sugars are running. 0 hypoglycemic episodes (CBG < 70 mg/dL) since last visit. Although she does have subjective hypoglycemic episodes of BG 80-90s mg/dL. Denies assisted hypoglycemia or recently hospitalizations for either hyper or hypoglycemia. denies polyuria, polydipsia, nausea, vomiting, diarrhea.  does not request refills today.    Review of  Systems: Per HPI.   Current Outpatient Medications: Medication Sig  . ACCU-CHEK FASTCLIX LANCETS MISC 1 each by Does not apply route 3 (three) times daily before meals. Dx code- 250.00  . amLODipine (NORVASC) 10 MG tablet TAKE ONE TABLET BY MOUTH EVERY DAY  . aspirin EC 81 MG tablet Take 81 mg by mouth daily.  Marland Kitchen atorvastatin (LIPITOR) 80 MG tablet Take 80 mg by mouth daily.  . bacitracin ointment Apply to affected area daily  . Bilberry, Vaccinium myrtillus, (BILBERRY PO) Take 1,000 mg by mouth daily.    Marland Kitchen BLACK COHOSH PO Take 1 capsule by mouth daily.   . Cholecalciferol (VITAMIN D3) 400 UNITS CAPS Take 400 Int'l Units by mouth at bedtime.   . cloNIDine (CATAPRES) 0.2 MG tablet TAKE ONE TABLET BY MOUTH TWICE DAILY  . co-enzyme Q-10 50 MG capsule Take 50 mg by mouth daily.  Marland Kitchen gabapentin (NEURONTIN) 300 MG capsule TAKE ONE CAPSULE BY MOUTH TWICE DAILY  . glucose blood (ACCU-CHEK SMARTVIEW) test strip Check blood sugar up to 3 times daily  Dx code 250.00  . ibuprofen (ADVIL,MOTRIN) 800 MG tablet Take 800 mg by mouth every 8 (eight) hours as needed. For pain  . insulin NPH-insulin regular (NOVOLIN 70/30) (70-30) 100 UNIT/ML injection Inject 25-38 Units into the skin 2 (two) times daily with a meal. 38 units SQ in morning before breakfast, 28 units SQ in evening before supper  . irbesartan (AVAPRO) 150 MG tablet TAKE ONE TABLET BY MOUTH EVERY DAY  . IRON PO Take 27 mg by mouth daily.  Marland Kitchen  loratadine (CLARITIN) 10 MG tablet Take 10 mg by mouth 2 (two) times daily as needed. For allergies   . LORazepam (ATIVAN) 1 MG tablet Take 1 mg by mouth at bedtime as needed. For anxiety or sleep  . metFORMIN (GLUCOPHAGE) 1000 MG tablet Take 1,000 mg by mouth 2 (two) times daily with a meal.  . metoprolol succinate (TOPROL-XL) 100 MG 24 hr tablet Take 1 tablet (100 mg total) by mouth daily. Take with or immediately following a meal.  . NITRO-DUR 0.4 MG/HR APPLY ONE PATCH TOPICALLY EVERYDAY  . nitroGLYCERIN  (NITROSTAT) 0.4 MG SL tablet Place 1 tablet (0.4 mg total) under the tongue every 5 (five) minutes as needed for chest pain.  Marland Kitchen Olopatadine HCl (PATADAY) 0.2 % SOLN Place 1 drop into both eyes daily as needed. For allergies  . polyethylene glycol powder (GLYCOLAX/MIRALAX) powder Take 17 g by mouth daily as needed. For constipation  . potassium chloride SA (K-DUR,KLOR-CON) 20 MEQ tablet Take 1 tablet (20 mEq total) by mouth 2 (two) times daily.  . promethazine (PHENERGAN) 25 MG tablet Take 1 tablet (25 mg total) by mouth every 6 (six) hours as needed for nausea.  . Red Yeast Rice 600 MG TABS Take by mouth daily.  . Thiamine HCl (VITAMIN B-1) 250 MG tablet Take 250 mg by mouth daily.  . traMADol (ULTRAM) 50 MG tablet Take 1 tablet (50 mg total) by mouth every 6 (six) hours as needed for pain.    Allergies  Allergen Reactions  . Lisinopril Other (See Comments)    Chronic cough secondary to ACE  . Oxycodone-Acetaminophen Hives and Itching    flushing    Past Medical History  Diagnosis Date  . Chest pain 11/07    cath 11/07 : LAD luminal irregularities, ramus intermediate with superior branch 70% stenosis, circumflex 50% stenosis, marginal 50% stenosis, well preserved ejection fraction. Stress test Jan 2013 : suggestion of lateral wall ischemia, likely artifact  . Hypertension     Requires 5 drug therapy and still difficult to control.  Marland Kitchen GERD (gastroesophageal reflux disease)   . Hyperlipidemia     On daily statin  . Obesity     Morbid. HAs worked with Lupita Leash T previously.  . Colonic polyp 1995    Colonoscopies 1994, 2000, and 02/02/2011. Last had 9 polyps - Tubular adenoma and tubulovillous was the pathology results without high grade dysplasia  . OSA (obstructive sleep apnea) 02/16/2007    Moderate AHI 35.6, O2 sat decreased to 65%, CPAP titration to 16 with AHI of 3.6.  Marland Kitchen Chronic pain     MR 08/07/10 :  Spondylosis L4-5 with B foraminal narrowing and L4 nerve root enchroachment B.  .  Seasonal allergies     seasonal  . Iron deficiency anemia     Ferritin was 12 in 2012. on FeSo4. Has had colon and EGD 02/02/11 that showed mild gastritis and colon polyps.  . Chronic venous insufficiency 2012    Has had full W/U incl ECHO, LFT's, Cr, Umicro, TSH, BNP. Symptomatic treatment.  . Diastolic CHF, chronic     NL EF by echo 01/2012, Gr I dd  . Arthritis     knees  . 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 S/p lumpectomy and radiation.  . Diabetes mellitus type II     Insulin dependent. Has worked with Lupita Leash R previously.    Past Surgical History  Procedure Laterality Date  . Colonoscopy  w/ polypectomy  1994, 2000, 2012  . Total abdominal hysterectomy  1995    2/2 uterine cancer  . Carotid dopplers  08/24/2007    Normal. Done 2/2 dizziness.  . Cardiac catheterization  2007    Non obstructing CAD - [facility]  . Breast lumpectomy w/ needle localization  2006    34 Chemo RX     Objective:    Physical Exam: Filed Vitals:   01/08/13 1316  BP: 127/63  Pulse: 66  Temp: 97.2 F (36.2 C)     General: Vital signs reviewed and noted. Well-developed, well-nourished, in no acute distress; alert, appropriate and cooperative throughout examination.  Head: Normocephalic, atraumatic.  Lungs:  Normal respiratory effort. Clear to auscultation BL without crackles or wheezes.  Heart: RRR. S1 and S2 normal without gallop, rubs. No murmur.  Abdomen:  BS normoactive. Soft, Nondistended, non-tender.  No masses or organomegaly.  Extremities: Trace pretibial edema. Tenderness to palpation over lumbar paraspinal musculature.  (+) peripheral neuropathy bilaterally. (+) straight leg raise.    Assessment/ Plan:   The patient's case and plan of care was discussed with attending physician, Dr. Margarito Liner.

## 2013-01-08 NOTE — Patient Instructions (Signed)
Good job always carrying sugar with you and bringing your meter to your visit today!   Please talk to your doctor about feeling low when your blood sugar is in the 90s, your goal of weight loss.  Please make a follow up in  About 3 months.

## 2013-01-08 NOTE — Progress Notes (Signed)
Diabetes Self-Management Education  Visit Number: Follow-up 1  01/08/2013 [name], identified by name and date of birth, is a 62 y.o. female with  Type 2 diabetes.  Other people present during visit:    her daughter  ASSESSMENT  Patient Concerns:   weight loss   Lab Results: LDL Cholesterol  Date Value Range Status  10/12/2012 139* 0 - 99 mg/dL Final    Hemoglobin Z3Y  Date Value Range Status  11/06/2012 6.4   Final  10/09/2011 8.6* <5.7 % Final     (NOTE)                                                                               Family History  Problem Relation Age of Onset  . Depression Daughter 55  . Osteoarthritis Maternal Grandmother   . Lung cancer Mother   . Diabetes Father   . Breast cancer Sister   . Pulmonary embolism Brother   . Colon cancer Neg Hx   . Heart attack Brother 52   History  Substance Use Topics  . Smoking status: Never Smoker   . Smokeless tobacco: Never Used  . Alcohol Use: No    Support Systems:    daughter, doctor visits and CDE Special Needs:    says she does not want to keep food records Prior DM Education:  yes Daily Foot Exams:  yes Patient Belief / Attitude about Diabetes:   can be controlled  Assessment comments: Does not seem to comprehend how los blood sugars can lead to weight gain or not being able to lose weight. Frustrated about lack of progress with weight loss.      Diet Recall: eating mostly on time, but does forget lunch sometimes. Eating small portions, less meat, and if she does eat meat it is baked or broiled, eating more fruits, vegetables, legumes.   Individualized Plan for Diabetes Self-Management Training:  Patient individualized diabetes plan discussed last visit includes:  Nutrition, medications, monitoring, how to handle highs and lows, and will add today Dealing daily with diabetes  Education Topics Reviewed with Patient Today:  Topic Points Discussed  Disease State    Nutrition Management     Physical Activity and Exercise    Medications    Monitoring   Reporting lows when meter reading is within target  Acute Complications  How to prevent and treat low blood sugar and what causes them   Chronic Complications    Psychosocial Adjustment    Goal Setting  Bets to set small reachable goals  Preconception Care (if applicable)      PATIENTS GOALS   Learning Objective(s):     Goal The patient agrees to:  Nutrition    Physical Activity    Medications    Monitoring    Problem Solving  (talk to doctor about lows and desire for weight loss )  Reducing Risk    Health Coping     Patient Self-Evaluation of Goals (Subsequent Visits)  Goal The patient rates self as meeting goals (% of time)  Nutrition  Is eating 6 small meals smaller portion  Physical Activity    Medications    Monitoring Not Applicable- she did  not check a 3 am blood sugar, but does check occassionlly before bed. Does not want to keep a food record, neither of these goals are desirable to patient  Problem Solving    Reducing Risk    Health Coping       PERSONALIZED PLAN / SUPPORT  Self-Management Support:  Doctor's office;Family;CDE visits ______________________________________________________________________  Outcomes  Expected Outcomes:  Demonstrated interest in learning. Expect positive outcomes Self-Care Barriers:  Low literacy;Lack of material resources Education material provided: yes If problems or questions, patient to contact team via:  Phone Time in: 1130     Time out: 1220 Future DSME appointment: - 3-4 months   Plyler, [name] 01/08/2013 1:30 PM

## 2013-01-08 NOTE — Progress Notes (Signed)
I discussed patient with resident Dr. Saralyn Pilar at the time of the visit, and I reviewed the history, findings, diagnosis, and treatment plan as outlined in the resident's note. I agree with the assessment and plans as outlined in her note.

## 2013-01-08 NOTE — Assessment & Plan Note (Addendum)
Pertinent Data:  MRI L Spine (08/07/2010) - 1. Spondylosis most notable at L4-5 where advanced facet  degenerative disease and disc bulging cause bilateral foraminal narrowing with encroachment on both exiting L4 roots. Central canal is mildly narrowed at this level. 2. Moderate to advanced facet degenerative disease L5-S1.   History of diabetic peripheral neuropathy and chronic venous stasis contributing towards chronic pain.  Has followed with PT and finding TENS unit to be somewhat effective.  Previously offered steroid injections to lumbar spine, but refused, now willing to proceed.  Assessment:  Patient describes significant pain of bilateral legs, most in the right thigh. Seems to stem from her low back radicular pain. She was previously evaluated at [facility] orthopedics with consideration for steroid injections to her low back. She previously refused, however now is agreeable to proceed.  Plan:      Will refer to [facility] orthopedics for continued evaluation and treatment with consideration for surgical versus injection therapy as indicated.

## 2013-01-08 NOTE — Assessment & Plan Note (Signed)
Pertinent Labs: Lab Results  Component Value Date   HGBA1C 6.4 11/06/2012   HGBA1C 8.6* 10/09/2011   POCGLU 107 11/11/2010   CREATININE 0.77 05/16/2012   CREATININE 1.02 07/13/2011   MICROALBUR 0.84 06/06/2012   MICRALBCREAT 5.6 06/06/2012   CHOL 218* 10/12/2012   HDL 54 10/12/2012   TRIG 127 10/12/2012    Assessment: Disease Control:   controlled   Progress toward goals:   at goal   Barriers to meeting goals: No specific barriers identified    The patient is adjusting her 70/30 insulin on a daily basis dependent upon how her blood sugars are running. This therefore, makes it difficult to interpret her glucometer readings. She states that typically she is taking 28 units twice a day. She is supposed to take 38 units in the morning and 28 units at night. However, she is adjusting her insulin by herself dependent upon how her blood sugars are running.  She feels symptomatic with blood sugars in the 90s, and which she will correct.  The patient was advised that adjusting her 70/30 insulin on a daily basis (somewhat as a sliding scale) will make it very difficult to have consistent blood sugar control. Additionally, will make it very difficult to provide recommendations as to how to adjust her insulin in response to glucometer readings. Lastly, will put at increased risk for hyper and hypoglycemic events.  Therefore, it is advised that the patient start with scheduled 70/30 insulin dosing, start increased BG monitoring, and allow [place] to adjust as appropriate. She is agreeable to this plan.  Plan: Glucometer log was reviewed today, as pt did have glucometer available for review.   Decrease insulin to 28 units BID.  reminded to bring blood glucose meter & log to each visit  Educational resources provided:    Self management tools provided:    Home glucose monitoring recommendation:    3 times daily before meals

## 2013-01-08 NOTE — Patient Instructions (Signed)
General Instructions:  Please follow-up at the clinic in 2 weeks, at which time we will reevaluate your diabetes - OR, please follow-up in the clinic sooner if needed.  There have been changes in your medications:  CHANGE your insulin dosage - Start taking 28 units in the morning and 28 units in the evenings.   Please see the orthopedic doctor for your back with consideration for steroid injections.  If you have been started on new medication(s), and you develop symptoms concerning for allergic reaction, including, but not limited to, throat closing, tongue swelling, rash, please stop the medication immediately and call the clinic at [phone], and go to the ER.  If you are diabetic, please bring your meter to your next visit.  If symptoms worsen, or new symptoms arise, please call the clinic or go to the ER.  PLEASE BRING ALL OF YOUR MEDICATIONS  IN A BAG TO YOUR NEXT APPOINTMENT   Treatment Goals:  Goals (1 Years of Data) as of 01/08/13         As of Today 12/12/12 11/06/12 10/12/12 08/08/12     Blood Pressure    . Blood Pressure < 140/90  127/63 122/66 135/71 123/76 123/68     Result Component    . HEMOGLOBIN A1C < 7.0    6.4  7.3    . LDL CALC < 100     139       Progress Toward Treatment Goals:  Treatment Goal 11/06/2012  Hemoglobin A1C improved  Blood pressure at goal    Self Care Goals & Plans:  Self Care Goal 11/06/2012  Manage my medications take my medicines as prescribed; bring my medications to every visit; refill my medications on time; follow the sick day instructions if I am sick  Monitor my health keep track of my blood glucose; bring my glucose meter and log to each visit; keep track of my blood pressure; keep track of my weight; check my feet daily  Eat healthy foods eat fruit for snacks and desserts; eat more vegetables; eat foods that are low in salt; eat baked foods instead of fried foods; eat smaller portions; drink diet soda or water instead of juice or soda   Be physically active take a walk every day; find an activity I enjoy    Home Blood Glucose Monitoring 11/06/2012  Check my blood sugar 2 times a day  When to check my blood sugar -     Care Management & Community Referrals:  Referral 11/06/2012  Referrals made for care management support none needed  Referrals made to community resources -

## 2013-01-09 ENCOUNTER — Encounter: Payer: Self-pay | Admitting: Gastroenterology

## 2013-01-22 ENCOUNTER — Ambulatory Visit (INDEPENDENT_AMBULATORY_CARE_PROVIDER_SITE_OTHER): Payer: Medicaid Other | Admitting: Internal Medicine

## 2013-01-22 ENCOUNTER — Encounter: Payer: Self-pay | Admitting: Internal Medicine

## 2013-01-22 VITALS — BP 126/76 | HR 75 | Temp 97.3°F | Ht 61.0 in | Wt 217.8 lb

## 2013-01-22 DIAGNOSIS — J309 Allergic rhinitis, unspecified: Secondary | ICD-10-CM

## 2013-01-22 DIAGNOSIS — E1142 Type 2 diabetes mellitus with diabetic polyneuropathy: Secondary | ICD-10-CM

## 2013-01-22 DIAGNOSIS — J302 Other seasonal allergic rhinitis: Secondary | ICD-10-CM

## 2013-01-22 DIAGNOSIS — I10 Essential (primary) hypertension: Secondary | ICD-10-CM

## 2013-01-22 DIAGNOSIS — E1149 Type 2 diabetes mellitus with other diabetic neurological complication: Secondary | ICD-10-CM

## 2013-01-22 DIAGNOSIS — E1140 Type 2 diabetes mellitus with diabetic neuropathy, unspecified: Secondary | ICD-10-CM

## 2013-01-22 MED ORDER — LORATADINE 10 MG PO TABS: 10.0000 mg | ORAL_TABLET | Freq: Every day | ORAL | Status: DC

## 2013-01-22 MED ORDER — GLUCOSE 4 G PO CHEW: 16.0000 g | CHEWABLE_TABLET | ORAL | Status: DC | PRN

## 2013-01-22 MED ORDER — METFORMIN HCL 1000 MG PO TABS: 500.0000 mg | ORAL_TABLET | Freq: Two times a day (BID) | ORAL | Status: DC

## 2013-01-22 NOTE — Assessment & Plan Note (Signed)
Pertinent Data: BP Readings from Last 3 Encounters:  01/22/13 126/76  01/08/13 127/63  12/12/12 122/66    Basic Metabolic Panel:    Component Value Date/Time   NA 141 05/16/2012 1218   K 4.0 05/16/2012 1218   CL 103 05/16/2012 1218   CO2 27 05/16/2012 1218   BUN 12 05/16/2012 1218   CREATININE 0.77 05/16/2012 1218   CREATININE 1.02 07/13/2011 1055   GLUCOSE 90 05/16/2012 1218   CALCIUM 10.3 05/16/2012 1218    Assessment: Disease Control: mildly elevated  Progress toward goals: unchanged  Barriers to meeting goals: no barriers identified    Having pain which is likely contributing towards her levator blood pressure..    Patient is compliant most of the time with prescribed medications.   Plan:  continue current medications  Educational resources provided: handout  Self management tools provided:

## 2013-01-22 NOTE — Progress Notes (Signed)
Patient: [name]   MRN: [id number]  DOB: 1951/01/05  PCP: Burns Spain, MD   Subjective:    CC: Follow-up, Diabetes and Knee Pain   HPI: [name] is a 62 y.o. female with a PMHx as outlined below, who presented to clinic today for the following:  1) DM2, controlled -  Lab Results  Component Value Date   HGBA1C 6.4 11/06/2012    Patient checking blood sugars 1-2 times daily, before breakfast and if feeling poorly. Reports fasting blood sugars of 97-110s mg/dL. Currently taking Metformin 1000mg  BID, Novolin 70/30 with 28 units BID - during our last visit together, she was taking her insulin as a sliding scale somewhat - therefore, it was changed to fixed dosage. She has been using it somewhat as a sliding scale dependent on how her blood sugars are running. 1 hypoglycemic episodes (CBG < 70 mg/dL) since last visit to 65 mg/dL on 4/09. Although she does have subjective hypoglycemic episodes of BG 80-90s mg/dL - gets shakiness, blurriness of vision. She also describes decreased and altered taste, particularly to meats. Denies assisted hypoglycemia or recently hospitalizations for either hyper or hypoglycemia. denies polyuria, polydipsia, nausea, vomiting, diarrhea.  does not request refills today.  2) Allergic rhinitis - the patient indicates seasonal history of allergic rhinitis. She is currently not on any oral antihistamines, however has had recent increased nasal congestion, postnasal drip, facial fullness, mild sore throat. She has had temperature up to 100 Fahrenheit without any distances greater than 100.4 Fahrenheit. She denies eye pain or discharge, ear pain or discharge.   Review of Systems: Per HPI.   Current Outpatient Medications: Medication Sig  . ACCU-CHEK FASTCLIX LANCETS MISC 1 each by Does not apply route 3 (three) times daily before meals. Dx code- 250.00  . amLODipine (NORVASC) 10 MG tablet TAKE ONE TABLET BY MOUTH EVERY DAY  . aspirin EC 81 MG  tablet Take 81 mg by mouth daily.  Marland Kitchen atorvastatin (LIPITOR) 80 MG tablet Take 80 mg by mouth daily.  . bacitracin ointment Apply to affected area daily  . Bilberry, Vaccinium myrtillus, (BILBERRY PO) Take 1,000 mg by mouth daily.    Marland Kitchen BLACK COHOSH PO Take 1 capsule by mouth daily.   . Cholecalciferol (VITAMIN D3) 400 UNITS CAPS Take 400 Int'l Units by mouth at bedtime.   . cloNIDine (CATAPRES) 0.2 MG tablet TAKE ONE TABLET BY MOUTH TWICE DAILY  . co-enzyme Q-10 50 MG capsule Take 50 mg by mouth daily.  Marland Kitchen gabapentin (NEURONTIN) 300 MG capsule TAKE ONE CAPSULE BY MOUTH TWICE DAILY  . glucose blood (ACCU-CHEK SMARTVIEW) test strip Check blood sugar up to 3 times daily  Dx code 250.00  . ibuprofen (ADVIL,MOTRIN) 800 MG tablet Take 800 mg by mouth every 8 (eight) hours as needed. For pain  . insulin NPH-regular (NOVOLIN 70/30) (70-30) 100 UNIT/ML injection 28 units SQ in morning before breakfast, 28 units SQ in evening before supper  . irbesartan (AVAPRO) 150 MG tablet TAKE ONE TABLET BY MOUTH EVERY DAY  . IRON PO Take 27 mg by mouth daily.  Marland Kitchen LORazepam (ATIVAN) 1 MG tablet Take 1 mg by mouth at bedtime as needed. For anxiety or sleep  . metFORMIN (GLUCOPHAGE) 1000 MG tablet Take 1,000 mg by mouth 2 (two) times daily with a meal.  . metoprolol succinate (TOPROL-XL) 100 MG 24 hr tablet Take 1 tablet (100 mg total) by mouth daily. Take with or immediately following a meal.  .  NITRO-DUR 0.4 MG/HR APPLY ONE PATCH TOPICALLY EVERYDAY  . Olopatadine HCl (PATADAY) 0.2 % SOLN Place 1 drop into both eyes daily as needed. For allergies  . polyethylene glycol powder (GLYCOLAX/MIRALAX) powder Take 17 g by mouth daily as needed. For constipation  . potassium chloride SA (K-DUR,KLOR-CON) 20 MEQ tablet Take 1 tablet (20 mEq total) by mouth 2 (two) times daily.  . promethazine (PHENERGAN) 25 MG tablet Take 1 tablet (25 mg total) by mouth every 6 (six) hours as needed for nausea.  . Red Yeast Rice 600 MG TABS Take by  mouth daily.  . Thiamine HCl (VITAMIN B-1) 250 MG tablet Take 250 mg by mouth daily.  . traMADol (ULTRAM) 50 MG tablet Take 1 tablet (50 mg total) by mouth every 6 (six) hours as needed for pain.  . nitroGLYCERIN (NITROSTAT) 0.4 MG SL tablet Place 1 tablet (0.4 mg total) under the tongue every 5 (five) minutes as needed for chest pain.    Allergies  Allergen Reactions  . Lisinopril Other (See Comments)    Chronic cough secondary to ACE  . Oxycodone-Acetaminophen Hives and Itching    flushing    Past Medical History  Diagnosis Date  . Chest pain 11/07    cath 11/07 : LAD luminal irregularities, ramus intermediate with superior branch 70% stenosis, circumflex 50% stenosis, marginal 50% stenosis, well preserved ejection fraction. Stress test Jan 2013 : suggestion of lateral wall ischemia, likely artifact  . Hypertension     Requires 5 drug therapy and still difficult to control.  Marland Kitchen GERD (gastroesophageal reflux disease)   . Hyperlipidemia     On daily statin  . Obesity     Morbid. HAs worked with Lupita Leash T previously.  . Colonic polyp 1995    Colonoscopies 1994, 2000, and 02/02/2011. Last had 9 polyps - Tubular adenoma and tubulovillous was the pathology results without high grade dysplasia  . OSA (obstructive sleep apnea) 02/16/2007    Moderate AHI 35.6, O2 sat decreased to 65%, CPAP titration to 16 with AHI of 3.6.  Marland Kitchen Chronic pain     MR 08/07/10 :  Spondylosis L4-5 with B foraminal narrowing and L4 nerve root enchroachment B.  . Seasonal allergies     seasonal  . Iron deficiency anemia     Ferritin was 12 in 2012. on FeSo4. Has had colon and EGD 02/02/11 that showed mild gastritis and colon polyps.  . Chronic venous insufficiency 2012    Has had full W/U incl ECHO, LFT's, Cr, Umicro, TSH, BNP. Symptomatic treatment.  . Diastolic CHF, chronic     NL EF by echo 01/2012, Gr I dd  . Arthritis     knees  . 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 S/p lumpectomy and radiation.  . Diabetes mellitus type II     Insulin dependent. Has worked with Lupita Leash R previously.    Objective:    Physical Exam: Filed Vitals:   01/22/13 1011  BP: 163/95  Pulse: 76  Temp: 97.3 F (36.3 C)    General: Vital signs reviewed and noted. Well-developed, well-nourished, in no acute distress; alert, appropriate and cooperative throughout examination.  Head: Normocephalic, atraumatic.  Eyes: conjunctivae/corneas clear. PERRL.  Ears: TM nonerythematous, not bulging, good light reflex bilaterally.  Nose: Mucous membranes moist, inflammed, erythematous.  Throat: Oropharynx nonerythematous, no exudate appreciated.   Neck: No deformities, masses, or tenderness noted.  Lungs:  Normal respiratory effort. Clear to auscultation  BL without crackles or wheezes.  Heart: RRR. S1 and S2 normal without gallop, rubs. No murmur.  Abdomen:  BS normoactive. Soft, Nondistended, non-tender.  No masses or organomegaly.  Extremities: No pretibial edema.    Assessment/ Plan:   The patient's case and plan of care was discussed with attending physician, Dr. Ulyess Mort.

## 2013-01-22 NOTE — Patient Instructions (Addendum)
General Instructions:  Please follow-up at the clinic in 1 month, at which time we will reevaluate your diabetes - OR, please follow-up in the clinic sooner if needed.  There have been changes in your medications:  DECREASE your Metformin to 500 mg (1/2 tablet) twice a day.  Try Nasonex if the flonase is not working  Start the claritin for your allergies   If you are diabetic, please bring your meter to your next visit.  If symptoms worsen, or new symptoms arise, please call the clinic or go to the ER.  PLEASE BRING ALL OF YOUR MEDICATIONS  IN A BAG TO YOUR NEXT APPOINTMENT   Treatment Goals:  Goals (1 Years of Data) as of 01/22/13         As of Today 01/08/13 12/12/12 11/06/12 10/12/12     Blood Pressure    . Blood Pressure < 140/90  163/95 127/63 122/66 135/71 123/76     Lifestyle    . Prevent Falls           Result Component    . HEMOGLOBIN A1C < 7.0     6.4     . LDL CALC < 100      139      Progress Toward Treatment Goals:  Treatment Goal 11/06/2012  Hemoglobin A1C improved  Blood pressure at goal    Self Care Goals & Plans:  Self Care Goal 11/06/2012  Manage my medications take my medicines as prescribed; bring my medications to every visit; refill my medications on time; follow the sick day instructions if I am sick  Monitor my health keep track of my blood glucose; bring my glucose meter and log to each visit; keep track of my blood pressure; keep track of my weight; check my feet daily  Eat healthy foods eat fruit for snacks and desserts; eat more vegetables; eat foods that are low in salt; eat baked foods instead of fried foods; eat smaller portions; drink diet soda or water instead of juice or soda  Be physically active take a walk every day; find an activity I enjoy    Home Blood Glucose Monitoring 11/06/2012  Check my blood sugar 2 times a day  When to check my blood sugar -     Care Management & Community Referrals:  Referral 11/06/2012  Referrals made for  care management support none needed  Referrals made to community resources -      Hypoglycemia (Low Blood Sugar) Hypoglycemia is when the glucose (sugar) in your blood is too low. Hypoglycemia can happen for many reasons. It can happen to people with or without diabetes. Hypoglycemia can develop quickly and can be a medical emergency.    CAUSES   Having hypoglycemia does not mean that you will develop diabetes. Different causes include: Missed or delayed meals or not enough carbohydrates eaten.  Medication overdose. This could be by accident or deliberate. If by accident, your medication may need to be adjusted or changed.  Exercise or increased activity without adjustments in carbohydrates or medications.  A nerve disorder that affects body functions like your heart rate, blood pressure and digestion (autonomic neuropathy).  A condition where the stomach muscles do not function properly (gastroparesis). Therefore, medications may not absorb properly.  The inability to recognize the signs of hypoglycemia (hypoglycemic unawareness).  Absorption of insulin - may be altered.  Alcohol consumption.  Pregnancy/menstrual cycles/postpartum. This may be due to hormones.  Certain kinds of tumors. This is very rare.  SYMPTOMS   Sweating.  Hunger.  Dizziness.  Blurred vision.  Drowsiness.  Weakness.  Headache.  Rapid heart beat.  Shakiness.  Nervousness.   DIAGNOSIS   Diagnosis is made by monitoring blood glucose in one or all of the following ways: Fingerstick blood glucose monitoring.  Laboratory results.   TREATMENT   If you think your blood glucose is low: Check your blood glucose, if possible. If it is less than 70 mg/dl, take one of the following:  3-4 glucose tablets.   cup juice (prefer clear like apple).   cup "regular" soda pop.  1 cup milk.  -1 tube of glucose gel.  5-6 hard candies.  Do not over treat because your blood glucose (sugar) will only go too high.  Wait  15 minutes and recheck your blood glucose. If it is still less than 70 mg/dl (or below your target range), repeat treatment.  Eat a snack if it is more than one hour until your next meal.  Sometimes, your blood glucose may go so low that you are unable to treat yourself. You may need someone to help you. You may even pass out or be unable to swallow. This may require you to get an injection of glucagon, which raises the blood glucose.  HOME CARE INSTRUCTIONS Check blood glucose as recommended by your caregiver.  Take medication as prescribed by your caregiver.  Follow your meal plan. Do not skip meals. Eat on time.  If you are going to drink alcohol, drink it only with meals.  Check your blood glucose before driving.  Check your blood glucose before and after exercise. If you exercise longer or different than usual, be sure to check blood glucose more frequently.  Always carry treatment with you. Glucose tablets are the easiest to carry.  Always wear medical alert jewelry or carry some form of identification that states that you have diabetes. This will alert people that you have diabetes. If you have hypoglycemia, they will have a better idea on what to do.   SEEK MEDICAL CARE IF:   You are having problems keeping your blood sugar at target range.  You are having frequent episodes of hypoglycemia.  You feel you might be having side effects from your medicines.  You have symptoms of an illness that is not improving after 3-4 days.  You notice a change in vision or a new problem with your vision.   SEEK IMMEDIATE MEDICAL CARE IF:   You are a family member or friend of a person whose blood glucose goes below 70 mg/dl and is accompanied by:  Confusion.  A change in mental status.  The inability to swallow.  Passing out.  Document Released: 09/20/2005 Document Revised: 09/09/2011 Document Reviewed: 05/15/2009 [facility] Patient Information 2012 [place], [place].

## 2013-01-22 NOTE — Assessment & Plan Note (Signed)
Assessment: Patient has history of seasonal allergies. No indication on exam or history of acute sinusitis - regardless as majority are viral, would plan to treat the same way initially. She is not currently on an antihistamine, therefore, will restart. I also think she could benefit from use of nasal steroid. She states she is using Flonase at home (no record on file), but it is not helping. I offered RX for nasonex, she refused.  Plan:      Start Loratadine once daily.  Continue nasal steroid - consider change to Nasonex.

## 2013-01-22 NOTE — Assessment & Plan Note (Signed)
Pertinent Labs: Lab Results  Component Value Date   HGBA1C 6.4 11/06/2012   HGBA1C 8.6* 10/09/2011   POCGLU 107 11/11/2010   CREATININE 0.77 05/16/2012   CREATININE 1.02 07/13/2011   MICROALBUR 0.84 06/06/2012   MICRALBCREAT 5.6 06/06/2012   CHOL 218* 10/12/2012   HDL 54 10/12/2012   TRIG 127 10/12/2012    Assessment: Disease Control: good control (HgbA1C at goal)  Progress toward goals: at goal  Barriers to meeting goals: no barriers identified    01/10/13 Office visit - During her last visit together in early April 2014, the patient indicated that she is typically using her 70/30 insulin, as a sliding scale. I was concerned about this, particularly given increased risk of hypo-glycemia. I therefore, advised her to start taking 28 units twice a day consistently.  01/22/2013 Office visit - The patient is now taking 70/30 insulin, 28 units twice a day without making self adjustments. Glucometer log indicates that she has had one early morning hypoglycemic event with a level of 65. Otherwise, preprandial to breakfast she is ranging between 92-142. Preprandial to dinner she is ranging 126-187. She is describing altered taste sensation, which I think could possibly be related to Metformin usage, therefore, will decrease dose of this medication.  Plan: Glucometer log was reviewed today, as pt did have glucometer available for review.   Continue 70/30 insulin at 20 units twice a day.  Decrease metformin to 500 mg twice a day (from prior 1000 mg twice a day)  Advise about hypoglycemia, checking blood sugars immediately if symptoms arise, and how to treat hypoglycemia.  Refills of glucose tablets were given.  Handout regarding hypoglycemia was given.  During next visit, if she is still having occasional episodes of early morning hypoglycemia, may consider to decrease evening time insulin.  During next visit, if she is still having Preprandial to dinner hyperglycemia, may consider to escalate a.m.  Insulin.   reminded to bring blood glucose meter & log to each visit  Educational resources provided:    Self management tools provided: copy of home glucose meter download;instructions for home glucose monitoring  Home glucose monitoring recommendation: 2 times a day before breakfast;at bedtime

## 2013-01-25 NOTE — Progress Notes (Signed)
agree with notes and plans. 

## 2013-02-02 ENCOUNTER — Other Ambulatory Visit: Payer: Self-pay | Admitting: Internal Medicine

## 2013-02-02 DIAGNOSIS — E1140 Type 2 diabetes mellitus with diabetic neuropathy, unspecified: Secondary | ICD-10-CM

## 2013-02-05 MED ORDER — METFORMIN HCL 500 MG PO TABS: 500.0000 mg | ORAL_TABLET | Freq: Two times a day (BID) | ORAL | Status: DC

## 2013-02-05 NOTE — Telephone Encounter (Signed)
Pls let her know that these are now 500 mg pills and she can go back to 1 full pill BID per Dr Sue Lush instructions. Had been 1000 mg one half pill BID.

## 2013-03-05 ENCOUNTER — Other Ambulatory Visit: Payer: Self-pay | Admitting: Internal Medicine

## 2013-03-05 ENCOUNTER — Encounter: Payer: Self-pay | Admitting: Internal Medicine

## 2013-03-06 ENCOUNTER — Ambulatory Visit (INDEPENDENT_AMBULATORY_CARE_PROVIDER_SITE_OTHER): Payer: Medicare Other | Admitting: Internal Medicine

## 2013-03-06 VITALS — BP 126/66 | HR 62 | Temp 97.7°F | Ht 61.0 in | Wt 213.2 lb

## 2013-03-06 DIAGNOSIS — E1149 Type 2 diabetes mellitus with other diabetic neurological complication: Secondary | ICD-10-CM | POA: Diagnosis not present

## 2013-03-06 DIAGNOSIS — E1140 Type 2 diabetes mellitus with diabetic neuropathy, unspecified: Secondary | ICD-10-CM

## 2013-03-06 DIAGNOSIS — I2510 Atherosclerotic heart disease of native coronary artery without angina pectoris: Secondary | ICD-10-CM

## 2013-03-06 DIAGNOSIS — I872 Venous insufficiency (chronic) (peripheral): Secondary | ICD-10-CM | POA: Diagnosis not present

## 2013-03-06 DIAGNOSIS — R269 Unspecified abnormalities of gait and mobility: Secondary | ICD-10-CM

## 2013-03-06 DIAGNOSIS — E669 Obesity, unspecified: Secondary | ICD-10-CM

## 2013-03-06 DIAGNOSIS — M6740 Ganglion, unspecified site: Secondary | ICD-10-CM | POA: Diagnosis not present

## 2013-03-06 DIAGNOSIS — I10 Essential (primary) hypertension: Secondary | ICD-10-CM | POA: Diagnosis not present

## 2013-03-06 DIAGNOSIS — G894 Chronic pain syndrome: Secondary | ICD-10-CM | POA: Diagnosis not present

## 2013-03-06 DIAGNOSIS — K219 Gastro-esophageal reflux disease without esophagitis: Secondary | ICD-10-CM

## 2013-03-06 DIAGNOSIS — E1142 Type 2 diabetes mellitus with diabetic polyneuropathy: Secondary | ICD-10-CM

## 2013-03-06 DIAGNOSIS — G8929 Other chronic pain: Secondary | ICD-10-CM

## 2013-03-06 DIAGNOSIS — E785 Hyperlipidemia, unspecified: Secondary | ICD-10-CM

## 2013-03-06 LAB — POCT GLYCOSYLATED HEMOGLOBIN (HGB A1C): Hemoglobin A1C: 6.5

## 2013-03-06 LAB — GLUCOSE, CAPILLARY: Glucose-Capillary: 125 mg/dL — ABNORMAL HIGH (ref 70–99)

## 2013-03-06 LAB — SEDIMENTATION RATE: Sed Rate: 38 mm/hr — ABNORMAL HIGH (ref 0–22)

## 2013-03-06 MED ORDER — OMEPRAZOLE 40 MG PO CPDR: 40.0000 mg | DELAYED_RELEASE_CAPSULE | Freq: Every day | ORAL | Status: DC

## 2013-03-06 NOTE — Patient Instructions (Addendum)
General Instructions: 1. I have referred you to neuro rehab to help with your walking 2. Continue your exercises 3. I will let you know of your blood test results 4. See me in 3 months. 5. Let me know if your sugar drops too low!! 6. You are making great efforts with your weight!!   Treatment Goals:  Goals (1 Years of Data) as of 03/06/13         As of Today 01/22/13 01/22/13 01/08/13 12/12/12     Blood Pressure    . Blood Pressure < 140/90  126/66 126/76 163/95 127/63 122/66     Lifestyle    . Prevent Falls           Result Component    . HEMOGLOBIN A1C < 7.0  6.5        . LDL CALC < 100            Progress Toward Treatment Goals:  Treatment Goal 03/06/2013  Hemoglobin A1C at goal  Blood pressure at goal  Prevent falls unchanged    Self Care Goals & Plans:  Self Care Goal 03/06/2013  Manage my medications take my medicines as prescribed  Monitor my health keep track of my blood glucose  Eat healthy foods eat more vegetables  Be physically active find an activity I enjoy  Prevent falls have my vision checked    Home Blood Glucose Monitoring 03/06/2013  Check my blood sugar 2 times a day  When to check my blood sugar before breakfast; before dinner     Care Management & Community Referrals:  Referral 03/06/2013  Referrals made for care management support none needed  Referrals made to community resources -

## 2013-03-06 NOTE — Assessment & Plan Note (Signed)
We did not have time to address today due to all the pain issues.

## 2013-03-06 NOTE — Assessment & Plan Note (Addendum)
[name] has lots of pain today.   1. R wrist ganglion cyst - had been wrapping it and had gone down but now up and painful. [name] called sports med an they do treat these so referred to them.  2. R > L leg drawing up and painful like rubber bands snapping.   3. Back pain - had seen ortho (no notes) and she states was told all of her leg pain and back pain from the back and had sch steroid injection but backed out when heard she wouldn't ne put to sleep for procedure.  Will try to get ortho notes. Uses Tramadol 2 AM and 2 PM. Sometimes an additional 2 during night. Bc she c/o stiffness lasting almost all day and was in hands (in addition to other areas) and she had never had rheum W/U, I ordered a sed rate, ANA, and RF to start with. She uses a TENS unit and has done PT that she thinks didn;t help too much.

## 2013-03-06 NOTE — Assessment & Plan Note (Signed)
Had been asked to take 70/30 20 units BID 2/2 relative hypoglycemia but still taking 28. Metformin had been decreased to 500 BID 2/2 bad taste in mouth. Her "lows" are in 41's and 71's but she feels bad when they occur. I had discussed decreasing insulin but she wanted to stay on 28 BID and see if she can get her body to tolerate the 80's. So will leave insulin and metformin as is.   Lab Results  Component Value Date   HGBA1C 6.5 03/06/2013   HGBA1C 6.4 11/06/2012   HGBA1C 7.3 08/08/2012     Assessment: Diabetes control: good control (HgbA1C at goal) Progress toward A1C goal:  at goal Comments: See above  Plan: Medications:  continue current medications Home glucose monitoring: Frequency: 2 times a day Timing: before breakfast;before dinner Instruction/counseling given: reminded to get eye exam Educational resources provided:   Self management tools provided: copy of home glucose meter download Other plans: See above

## 2013-03-06 NOTE — Assessment & Plan Note (Signed)
Weight is down a few pounds but she is concerned about the slowness of this

## 2013-03-06 NOTE — Assessment & Plan Note (Signed)
I referred her to neuro rehab. She has had falls. Feels that she leans to the L when walks and requires cane and her daughter. Does try to walk QD. I filled out paperwork for [facility].   Fall Screening 10/12/2012 11/06/2012 01/22/2013 03/06/2013  Falls in the past year? Yes No Yes Yes  Number of falls in past year 2 or more - - 2 or more  Risk Factor Category  High Fall Risk - - High Fall Risk      Assessment: Progress toward fall prevention goal:  unchanged Comments: See above  Plan: Fall prevention plans: CDC handout Educational resources provided: brochure Self management tools provided: home fall prevention checklist

## 2013-03-06 NOTE — Assessment & Plan Note (Signed)
She does have pitting edema lower legs at +1. 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 I explained this dx and that there is little to treat it. She has support hose and knows to keep legs elevated.

## 2013-03-06 NOTE — Assessment & Plan Note (Signed)
BP is well controlled today. Cont current meds.  BP Readings from Last 3 Encounters:  03/06/13 126/66  01/22/13 126/76  01/08/13 127/63    Lab Results  Component Value Date   NA 141 05/16/2012   K 4.0 05/16/2012   CREATININE 0.77 05/16/2012    Assessment: Blood pressure control: controlled Progress toward BP goal:  at goal Comments: see above  Plan: Medications:  continue current medications Educational resources provided:   Self management tools provided:   Other plans: See above

## 2013-03-06 NOTE — Assessment & Plan Note (Signed)
Asymptomatic. On ASA, BB, NTG patch, statin.

## 2013-03-06 NOTE — Progress Notes (Signed)
  Subjective:    Patient ID: [name], female    DOB: 03-13-1951, 62 y.o.   MRN: [id number]  HPI  Please see the A&P for the status of the pt's chronic medical problems.   Review of Systems  Constitutional: Negative for unexpected weight change.  HENT: Negative for congestion and postnasal drip.   Eyes: Positive for visual disturbance. Negative for pain and itching.  Respiratory: Negative for cough and shortness of breath.   Cardiovascular: Positive for leg swelling. Negative for chest pain.  Gastrointestinal: Positive for constipation. Negative for abdominal pain and diarrhea.  Endocrine: Negative for heat intolerance.  Genitourinary: Negative for vaginal bleeding.  Musculoskeletal: Positive for myalgias and arthralgias.  Skin: Positive for color change.  Neurological: Positive for weakness.       Falls  Psychiatric/Behavioral: Negative for sleep disturbance.       Objective:   Physical Exam  Constitutional: She is oriented to person, place, and time. She appears well-developed and well-nourished. She is cooperative. No distress.  HENT:  Head: Normocephalic and atraumatic.  Right Ear: External ear normal.  Left Ear: External ear normal.  Nose: Nose normal.  Eyes: Conjunctivae and EOM are normal.  Neck: Normal range of motion.  Cardiovascular: Normal rate, regular rhythm and normal heart sounds.   No carotid bruits B  Pulmonary/Chest: Effort normal and breath sounds normal.  Abdominal: Bowel sounds are normal.  Musculoskeletal: Normal range of motion. She exhibits edema and tenderness.  Neurological: She is alert and oriented to person, place, and time.  Skin: Skin is warm and dry. She is not diaphoretic.  Hyperpigmented B LE just proximal to ankles  Psychiatric: She has a normal mood and affect. Her behavior is normal. Thought content normal.          Assessment & Plan:

## 2013-03-07 LAB — ANA: Anti Nuclear Antibody(ANA): NEGATIVE

## 2013-03-07 LAB — RHEUMATOID FACTOR: Rhuematoid fact SerPl-aCnc: <10 IU/mL (ref <=14)

## 2013-03-08 ENCOUNTER — Other Ambulatory Visit: Payer: Self-pay | Admitting: *Deleted

## 2013-03-08 MED ORDER — IRBESARTAN 150 MG PO TABS: 150.0000 mg | ORAL_TABLET | Freq: Every day | ORAL | Status: DC

## 2013-03-14 ENCOUNTER — Ambulatory Visit: Payer: Medicaid Other | Admitting: Family Medicine

## 2013-03-19 ENCOUNTER — Telehealth: Payer: Self-pay | Admitting: Dietician

## 2013-03-21 ENCOUNTER — Ambulatory Visit (INDEPENDENT_AMBULATORY_CARE_PROVIDER_SITE_OTHER): Payer: Medicare Other | Admitting: Sports Medicine

## 2013-03-21 VITALS — BP 143/88 | Ht 61.0 in | Wt 210.0 lb

## 2013-03-21 DIAGNOSIS — M6740 Ganglion, unspecified site: Secondary | ICD-10-CM

## 2013-03-21 DIAGNOSIS — M67431 Ganglion, right wrist: Secondary | ICD-10-CM

## 2013-03-21 NOTE — Progress Notes (Signed)
  Subjective:    Patient ID: [name], female    DOB: [birth year], 62 y.o.   MRN: [id number]  HPI chief complaint: Right wrist cyst  Patient is a very pleasant right-hand-dominant 62 year old female that comes in today complaining of a cyst along the volar aspect of her wrist. Cyst has been present for a couple years but has recently increased in size and become more painful. She discussed treatment options with her primary care physician who referred her to our office. Patient is interested in having this surgically excised. No associated numbness or tingling. No trauma.  Past medical history and current medications are reviewed    Review of Systems     Objective:   Physical Exam Well-developed, well-nourished. No acute distress. Awake alert and oriented x3  Right wrist: There is a moderate size ganglion cyst along the radial aspect of the volar wrist. It is tender to palpation. There is no erythema. It is fluctuant and there are no signs of infection. There is limited wrist flexion. Neurovascularly intact distally.       Assessment & Plan:  1. Right wrist ganglion cyst  I discussed aspiration and injection but the patient would like to have this surgically excised. Given the length of her symptoms and size of the cyst I think this is a reasonable option. I recommended referral to Dr. Mina Marble, Merlyn Lot, or Sypher to have this done. She has Medicaid so we will need to get a referral from her primary care physician. Once the referral is in place I would defer definitive treatment to one of those hand specialists. I think she will do well postoperatively. Followup with me when necessary.

## 2013-03-27 ENCOUNTER — Ambulatory Visit: Payer: Medicare Other | Attending: Internal Medicine | Admitting: Rehabilitative and Restorative Service Providers"

## 2013-03-27 DIAGNOSIS — R269 Unspecified abnormalities of gait and mobility: Secondary | ICD-10-CM | POA: Diagnosis not present

## 2013-03-27 DIAGNOSIS — IMO0001 Reserved for inherently not codable concepts without codable children: Secondary | ICD-10-CM | POA: Insufficient documentation

## 2013-03-27 DIAGNOSIS — M6281 Muscle weakness (generalized): Secondary | ICD-10-CM | POA: Insufficient documentation

## 2013-03-27 NOTE — Telephone Encounter (Signed)
Unable to reach patient by phone for nutrition/diabetes follow up.

## 2013-03-28 ENCOUNTER — Other Ambulatory Visit: Payer: Self-pay | Admitting: Internal Medicine

## 2013-03-28 DIAGNOSIS — M6740 Ganglion, unspecified site: Secondary | ICD-10-CM

## 2013-03-30 ENCOUNTER — Other Ambulatory Visit: Payer: Self-pay | Admitting: Internal Medicine

## 2013-03-30 DIAGNOSIS — H35379 Puckering of macula, unspecified eye: Secondary | ICD-10-CM | POA: Diagnosis not present

## 2013-03-30 DIAGNOSIS — H40059 Ocular hypertension, unspecified eye: Secondary | ICD-10-CM | POA: Diagnosis not present

## 2013-03-30 DIAGNOSIS — H40009 Preglaucoma, unspecified, unspecified eye: Secondary | ICD-10-CM | POA: Diagnosis not present

## 2013-03-30 DIAGNOSIS — I10 Essential (primary) hypertension: Secondary | ICD-10-CM

## 2013-03-30 DIAGNOSIS — H35349 Macular cyst, hole, or pseudohole, unspecified eye: Secondary | ICD-10-CM | POA: Diagnosis not present

## 2013-03-30 MED ORDER — LOSARTAN POTASSIUM 50 MG PO TABS: 50.0000 mg | ORAL_TABLET | Freq: Every day | ORAL | Status: DC

## 2013-03-30 NOTE — Progress Notes (Signed)
Called pt to inform her of med substitute. No answer. Left message for her to check with her pharmacy. Would you pls call her Monday? Thanks

## 2013-04-03 MED ORDER — LOSARTAN POTASSIUM 50 MG PO TABS: 50.0000 mg | ORAL_TABLET | Freq: Every day | ORAL | Status: DC

## 2013-04-03 NOTE — Addendum Note (Signed)
Addended by: Blanch [place] A on: 04/03/2013 02:18 PM   Modules accepted: Orders

## 2013-04-03 NOTE — Progress Notes (Signed)
Called again and wasn't there.   The 1st Rx had printed so I resent it this time electronically.

## 2013-04-04 ENCOUNTER — Encounter: Payer: Self-pay | Admitting: Internal Medicine

## 2013-04-10 ENCOUNTER — Other Ambulatory Visit: Payer: Self-pay | Admitting: Internal Medicine

## 2013-04-10 DIAGNOSIS — E1140 Type 2 diabetes mellitus with diabetic neuropathy, unspecified: Secondary | ICD-10-CM

## 2013-04-11 MED ORDER — INSULIN NPH ISOPHANE & REGULAR (70-30) 100 UNIT/ML ~~LOC~~ SUSP: SUBCUTANEOUS | Status: DC

## 2013-04-12 ENCOUNTER — Other Ambulatory Visit: Payer: Self-pay

## 2013-04-12 ENCOUNTER — Encounter (INDEPENDENT_AMBULATORY_CARE_PROVIDER_SITE_OTHER): Payer: Medicaid Other | Admitting: Ophthalmology

## 2013-04-12 DIAGNOSIS — E1139 Type 2 diabetes mellitus with other diabetic ophthalmic complication: Secondary | ICD-10-CM

## 2013-04-12 DIAGNOSIS — H35379 Puckering of macula, unspecified eye: Secondary | ICD-10-CM

## 2013-04-12 DIAGNOSIS — I10 Essential (primary) hypertension: Secondary | ICD-10-CM

## 2013-04-12 DIAGNOSIS — H35039 Hypertensive retinopathy, unspecified eye: Secondary | ICD-10-CM

## 2013-04-12 DIAGNOSIS — H3530 Unspecified macular degeneration: Secondary | ICD-10-CM

## 2013-04-12 DIAGNOSIS — E11319 Type 2 diabetes mellitus with unspecified diabetic retinopathy without macular edema: Secondary | ICD-10-CM

## 2013-05-08 ENCOUNTER — Ambulatory Visit (INDEPENDENT_AMBULATORY_CARE_PROVIDER_SITE_OTHER): Payer: Medicare Other | Admitting: Internal Medicine

## 2013-05-08 ENCOUNTER — Encounter: Payer: Self-pay | Admitting: Internal Medicine

## 2013-05-08 VITALS — BP 117/68 | HR 70 | Temp 97.0°F | Ht 61.0 in | Wt 215.2 lb

## 2013-05-08 DIAGNOSIS — R5383 Other fatigue: Secondary | ICD-10-CM | POA: Diagnosis not present

## 2013-05-08 DIAGNOSIS — J302 Other seasonal allergic rhinitis: Secondary | ICD-10-CM

## 2013-05-08 DIAGNOSIS — I872 Venous insufficiency (chronic) (peripheral): Secondary | ICD-10-CM | POA: Diagnosis not present

## 2013-05-08 DIAGNOSIS — I2510 Atherosclerotic heart disease of native coronary artery without angina pectoris: Secondary | ICD-10-CM

## 2013-05-08 DIAGNOSIS — G4733 Obstructive sleep apnea (adult) (pediatric): Secondary | ICD-10-CM | POA: Diagnosis not present

## 2013-05-08 DIAGNOSIS — E1142 Type 2 diabetes mellitus with diabetic polyneuropathy: Secondary | ICD-10-CM | POA: Diagnosis not present

## 2013-05-08 DIAGNOSIS — I10 Essential (primary) hypertension: Secondary | ICD-10-CM

## 2013-05-08 DIAGNOSIS — R5381 Other malaise: Secondary | ICD-10-CM | POA: Diagnosis not present

## 2013-05-08 DIAGNOSIS — J309 Allergic rhinitis, unspecified: Secondary | ICD-10-CM | POA: Diagnosis not present

## 2013-05-08 DIAGNOSIS — E1140 Type 2 diabetes mellitus with diabetic neuropathy, unspecified: Secondary | ICD-10-CM

## 2013-05-08 DIAGNOSIS — E669 Obesity, unspecified: Secondary | ICD-10-CM

## 2013-05-08 DIAGNOSIS — E785 Hyperlipidemia, unspecified: Secondary | ICD-10-CM

## 2013-05-08 DIAGNOSIS — G8929 Other chronic pain: Secondary | ICD-10-CM | POA: Diagnosis not present

## 2013-05-08 DIAGNOSIS — E1149 Type 2 diabetes mellitus with other diabetic neurological complication: Secondary | ICD-10-CM | POA: Diagnosis not present

## 2013-05-08 LAB — CBC
Hemoglobin: 11.7 g/dL — ABNORMAL LOW (ref 12.0–15.0)
MCH: 28.2 pg (ref 26.0–34.0)
MCHC: 33.3 g/dL (ref 30.0–36.0)
MCV: 84.6 fL (ref 78.0–100.0)
Platelets: 253 10*3/uL (ref 150–400)
RDW: 16.5 % — ABNORMAL HIGH (ref 11.5–15.5)

## 2013-05-08 LAB — GLUCOSE, CAPILLARY: Glucose-Capillary: 130 mg/dL — ABNORMAL HIGH (ref 70–99)

## 2013-05-08 MED ORDER — AMLODIPINE BESYLATE 10 MG PO TABS: ORAL_TABLET | ORAL | Status: DC

## 2013-05-08 MED ORDER — PROMETHAZINE HCL 25 MG PO TABS: 25.0000 mg | ORAL_TABLET | Freq: Four times a day (QID) | ORAL | Status: DC | PRN

## 2013-05-08 MED ORDER — CLONIDINE HCL 0.2 MG PO TABS: ORAL_TABLET | ORAL | Status: DC

## 2013-05-08 MED ORDER — METFORMIN HCL 500 MG PO TABS: 500.0000 mg | ORAL_TABLET | Freq: Two times a day (BID) | ORAL | Status: DC

## 2013-05-08 MED ORDER — METOPROLOL SUCCINATE ER 100 MG PO TB24: 100.0000 mg | ORAL_TABLET | Freq: Every day | ORAL | Status: DC

## 2013-05-08 MED ORDER — NITRO-DUR 0.4 MG/HR TD PT24: MEDICATED_PATCH | TRANSDERMAL | Status: DC

## 2013-05-08 MED ORDER — TRAMADOL HCL 50 MG PO TABS: 50.0000 mg | ORAL_TABLET | Freq: Four times a day (QID) | ORAL | Status: DC | PRN

## 2013-05-08 MED ORDER — LORATADINE 10 MG PO TABS: ORAL_TABLET | ORAL | Status: DC

## 2013-05-08 MED ORDER — POTASSIUM CHLORIDE CRYS ER 20 MEQ PO TBCR: 20.0000 meq | EXTENDED_RELEASE_TABLET | Freq: Two times a day (BID) | ORAL | Status: DC

## 2013-05-08 NOTE — Assessment & Plan Note (Signed)
Had not wanted to increase statin last visit despite increased LDL so will check today.

## 2013-05-08 NOTE — Assessment & Plan Note (Signed)
She complains of a charley horse around her heart - kind of a squeezing. Not exertional, can wake her up at night. Lasts only 1 or 2 sec. This is the same exact pain she has had for years. She has had cards W/U and PPI trial with out relief. The pain is so brief that no need to take NTG. She is concerned bc her brother recently in H in [place] and almost died and EF is 15%. Cont to follow.

## 2013-05-08 NOTE — Assessment & Plan Note (Signed)
Slow, steady weight loss but up 5 lbs June to Aug

## 2013-05-08 NOTE — Progress Notes (Signed)
  Subjective:    Patient ID: [name], female    DOB: 07-Jun-1951, 62 y.o.   MRN: [id number]  Diabetes Hypoglycemia symptoms include confusion, dizziness and headaches. Associated symptoms include chest pain, fatigue and weakness.  Please see the A&P for the status of the pt's chronic medical problems.   Review of Systems  Constitutional: Positive for fatigue. Negative for activity change, appetite change and unexpected weight change.  HENT: Negative for rhinorrhea and sneezing.   Eyes: Positive for itching.  Respiratory: Positive for shortness of breath and wheezing.   Cardiovascular: Positive for chest pain and leg swelling.  Gastrointestinal: Positive for constipation. Negative for abdominal pain and diarrhea.  Genitourinary: Negative for dysuria and difficulty urinating.  Musculoskeletal: Positive for back pain, arthralgias and gait problem.  Neurological: Positive for dizziness, weakness, light-headedness and headaches.  Psychiatric/Behavioral: Positive for confusion. Negative for behavioral problems.       Objective:   Physical Exam  Constitutional: She is oriented to person, place, and time. She appears well-developed and well-nourished. No distress.  HENT:  Head: Normocephalic and atraumatic.  Right Ear: External ear normal.  Left Ear: External ear normal.  Nose: Nose normal.  Eyes: Conjunctivae and EOM are normal. Right eye exhibits no discharge. Left eye exhibits no discharge. No scleral icterus.  Cardiovascular: Normal rate, regular rhythm and normal heart sounds.   + L sided chest wall tenderness  Pulmonary/Chest: No respiratory distress.  Abdominal: Soft. Bowel sounds are normal. There is no tenderness.  Musculoskeletal: She exhibits edema and tenderness.  Neurological: She is alert and oriented to person, place, and time.  Skin: Skin is warm and dry. She is not diaphoretic. No erythema.  Psychiatric: She has a normal mood and affect. Her behavior is normal.  Judgment and thought content normal.          Assessment & Plan:

## 2013-05-08 NOTE — Assessment & Plan Note (Signed)
For past 3 months, falling asleep in front of TV and is then difficult to awaken and confused when first wakes up. Stops driving bc fell asleep driving. Does use CPAP. No sig weight gain to suggest the settings need altered. Start with basic labs and if OK, consider repeat sleep study since doing right things like exercising.

## 2013-05-08 NOTE — Patient Instructions (Signed)
See me in 2-4 months I will send you your test results

## 2013-05-08 NOTE — Assessment & Plan Note (Signed)
Has on her stockings today. Still has LE edema B.

## 2013-05-08 NOTE — Assessment & Plan Note (Signed)
F/U on her pain today  1. R wrist ganglion cyst - had been wrapping it and had gone down but now up and painful. [name] called sports med an they do treat these so referred to them. Saw sports med and they referred to hand surgery. Has appt next week. Stopped crochet and wearing a brace now and it is now less hard and a but smaller.  2. R > L leg drawing up and painful like rubber bands snapping. Still occuring. Check labs today. No longer on Lasix and on K so she is confused that it is still occuring  3. Back pain - had seen ortho (no notes) and she states was told all of her leg pain and back pain from the back and had sch steroid injection but backed out when heard she wouldn't ne put to sleep for procedure. Cont to refuse steroid injection. She is doing water aerobics and is losing a bit of weight. On gaba and ultram PRN- typical day uses 3, bad day uses 4.

## 2013-05-08 NOTE — Assessment & Plan Note (Signed)
Well controlled on norvasc 10, clonidine 0.2, cozaar 50, toprol 100. Cont current meds BP Readings from Last 3 Encounters:  05/08/13 117/68  03/21/13 143/88  03/06/13 126/66    Lab Results  Component Value Date   NA 141 05/16/2012   K 4.0 05/16/2012   CREATININE 0.77 05/16/2012    Assessment: Blood pressure control: controlled Progress toward BP goal:  at goal Comments: see above  Plan: Medications:  continue current medications Educational resources provided:   Self management tools provided:   Other plans: see above

## 2013-05-08 NOTE — Assessment & Plan Note (Signed)
Doing great. Checks CBG BID. 85% at goal and 15% high. Pt reports few lows but not recorded. UTD on all HM for DM.  Lab Results  Component Value Date   HGBA1C 6.5 03/06/2013   HGBA1C 6.4 11/06/2012   HGBA1C 7.3 08/08/2012     Assessment: Diabetes control: good control (HgbA1C at goal) Progress toward A1C goal:  at goal Comments: See above  Plan: Medications:  continue current medications metformin 500 BID and Novolin 70/30 28 BID Home glucose monitoring: Frequency: 2 times a day Timing: before breakfast;before dinner Instruction/counseling given: no instruction/counseling  - except weight loss Educational resources provided:   Self management tools provided:   Other plans: See above

## 2013-05-09 LAB — BASIC METABOLIC PANEL WITH GFR
CO2: 25 mEq/L (ref 19–32)
Chloride: 104 mEq/L (ref 96–112)
GFR, Est Non African American: 73 mL/min
Glucose, Bld: 130 mg/dL — ABNORMAL HIGH (ref 70–99)
Potassium: 4.2 mEq/L (ref 3.5–5.3)
Sodium: 138 mEq/L (ref 135–145)

## 2013-05-09 LAB — LIPID PANEL
Cholesterol: 176 mg/dL (ref 0–200)
HDL: 56 mg/dL (ref >39)
LDL Cholesterol: 103 mg/dL — ABNORMAL HIGH (ref 0–99)
Total CHOL/HDL Ratio: 3.1 Ratio
Triglycerides: 84 mg/dL (ref <150)
VLDL: 17 mg/dL (ref 0–40)

## 2013-05-09 LAB — TSH: TSH: 2.421 u[IU]/mL (ref 0.350–4.500)

## 2013-05-22 DIAGNOSIS — M6740 Ganglion, unspecified site: Secondary | ICD-10-CM | POA: Diagnosis not present

## 2013-05-22 DIAGNOSIS — G5600 Carpal tunnel syndrome, unspecified upper limb: Secondary | ICD-10-CM | POA: Diagnosis not present

## 2013-05-25 ENCOUNTER — Other Ambulatory Visit: Payer: Self-pay | Admitting: Internal Medicine

## 2013-05-25 DIAGNOSIS — E1140 Type 2 diabetes mellitus with diabetic neuropathy, unspecified: Secondary | ICD-10-CM

## 2013-05-25 MED ORDER — INSULIN NPH ISOPHANE & REGULAR (70-30) 100 UNIT/ML ~~LOC~~ SUSP: SUBCUTANEOUS | Status: DC

## 2013-05-30 ENCOUNTER — Other Ambulatory Visit: Payer: Self-pay | Admitting: *Deleted

## 2013-05-30 DIAGNOSIS — E1140 Type 2 diabetes mellitus with diabetic neuropathy, unspecified: Secondary | ICD-10-CM

## 2013-05-30 NOTE — Telephone Encounter (Signed)
A yr's supply was entered 10/2012

## 2013-05-31 ENCOUNTER — Encounter: Payer: Self-pay | Admitting: Internal Medicine

## 2013-06-01 ENCOUNTER — Telehealth: Payer: Self-pay | Admitting: *Deleted

## 2013-06-01 MED ORDER — INSULIN NPH ISOPHANE & REGULAR (70-30) 100 UNIT/ML ~~LOC~~ SUSP: SUBCUTANEOUS | Status: DC

## 2013-06-01 NOTE — Telephone Encounter (Signed)
Received call from pt stating her insurance needs PA for her novolin  70/30.  Pt's insurance was contacted at [phone].  This medication is a step therapy, meaning pt must have tried and failed humulin 70/30 in the past 100 days. Request for novolin 70/30 was denied at this time (Ref code: [id number]).    Will forward information to pcp for review.Criss Alvine, Darlene Cassady8/29/201411:16 AM

## 2013-06-11 ENCOUNTER — Telehealth: Payer: Self-pay | Admitting: *Deleted

## 2013-06-11 NOTE — Telephone Encounter (Signed)
Pt left a message unable to afford  meds for diabetes. Left message on pt ID phone recording - suggest MAP  , Karin Golden for free Metformin and try calling [employer]. States CBG are still high [phone]. Stanton Kidney Lanise Mergen RN 06/11/13 1:45PM

## 2013-06-12 ENCOUNTER — Other Ambulatory Visit: Payer: Self-pay | Admitting: *Deleted

## 2013-06-12 DIAGNOSIS — E1140 Type 2 diabetes mellitus with diabetic neuropathy, unspecified: Secondary | ICD-10-CM

## 2013-06-12 MED ORDER — GLUCOSE BLOOD VI STRP: ORAL_STRIP | Status: DC

## 2013-06-12 NOTE — Telephone Encounter (Signed)
Script expired 

## 2013-06-13 ENCOUNTER — Other Ambulatory Visit: Payer: Self-pay | Admitting: Dietician

## 2013-06-13 DIAGNOSIS — E1140 Type 2 diabetes mellitus with diabetic neuropathy, unspecified: Secondary | ICD-10-CM

## 2013-06-13 MED ORDER — INSULIN NPH ISOPHANE & REGULAR (70-30) 100 UNIT/ML ~~LOC~~ SUSP: SUBCUTANEOUS | Status: DC

## 2013-06-13 NOTE — Telephone Encounter (Signed)
Please see previous chart notes from Kingsley Spittle and Stanton Kidney Ditzler about patient's insulin coverage by her insurance company. Spoke with patient and she desires to change pharmacies. She says she has been without her insulin and her blood sugars are high.   Request new prescription for humulin 70/30 be sent to [facility] on [address] ASAP.

## 2013-06-13 NOTE — Telephone Encounter (Signed)
Dr Criselda Peaches has sent in the Rx.

## 2013-08-09 ENCOUNTER — Other Ambulatory Visit: Payer: Self-pay

## 2013-08-10 ENCOUNTER — Ambulatory Visit: Payer: Medicare Other | Admitting: Dietician

## 2013-08-10 ENCOUNTER — Ambulatory Visit (INDEPENDENT_AMBULATORY_CARE_PROVIDER_SITE_OTHER): Payer: Medicare Other | Admitting: Internal Medicine

## 2013-08-10 ENCOUNTER — Encounter: Payer: Self-pay | Admitting: Internal Medicine

## 2013-08-10 VITALS — BP 147/75 | HR 73 | Temp 98.1°F | Ht 61.0 in | Wt 215.4 lb

## 2013-08-10 DIAGNOSIS — E1140 Type 2 diabetes mellitus with diabetic neuropathy, unspecified: Secondary | ICD-10-CM

## 2013-08-10 DIAGNOSIS — E1142 Type 2 diabetes mellitus with diabetic polyneuropathy: Secondary | ICD-10-CM | POA: Diagnosis not present

## 2013-08-10 DIAGNOSIS — E1149 Type 2 diabetes mellitus with other diabetic neurological complication: Secondary | ICD-10-CM

## 2013-08-10 LAB — GLUCOSE, CAPILLARY: Glucose-Capillary: 243 mg/dL — ABNORMAL HIGH (ref 70–99)

## 2013-08-10 LAB — POCT GLYCOSYLATED HEMOGLOBIN (HGB A1C): Hemoglobin A1C: 7.7

## 2013-08-10 NOTE — Progress Notes (Signed)
Subjective:     Patient ID: [name], female   DOB: 1950/12/21, 62 y.o.   MRN: [id number]  HPI The patient is a 62 YO female coming in with history of breast cancer, gait disturbance, CAD, chronic pain, DM II, hypertension who is coming in today concerned that her BP was elevated today and yesterday at home. She has noted BP check at home of 170s systolic as well as low BPs with BP of 90/60s. She was not symptomatic with these blood pressures. She is out of strips and would like to send her medications to [facility] as [facility] has not been satisfying to her. She was unable to get her insulin for about 6 weeks and has been back on treatment for about 3-4 weeks. She brings in her meter which confirms that her sugars have been running about 120-160 for the last month. While she was off insulin she states they went as high as 300. No other complaints at today's visit.   Review of Systems  Constitutional: Negative for fever, chills, activity change, appetite change, fatigue and unexpected weight change.  Respiratory: Negative for cough, chest tightness, shortness of breath and wheezing.   Cardiovascular: Negative for chest pain, palpitations and leg swelling.  Musculoskeletal: Positive for gait problem.       Uses cane to ambulate  Neurological: Negative for dizziness, tremors, seizures, syncope, facial asymmetry, speech difficulty, weakness, light-headedness, numbness and headaches.       Objective:   Physical Exam  Constitutional: She is oriented to person, place, and time. She appears well-developed and well-nourished. No distress.  HENT:  Head: Normocephalic and atraumatic.  Eyes: EOM are normal. Pupils are equal, round, and reactive to light.  Neck: Normal range of motion. Neck supple. No JVD present.  Cardiovascular: Normal rate and regular rhythm.   No murmur heard. Pulmonary/Chest: Effort normal and breath sounds normal. No respiratory distress. She has no wheezes. She has  no rales.  Abdominal: Soft. Bowel sounds are normal. She exhibits no distension. There is no tenderness. There is no rebound.  Obese  Musculoskeletal: Normal range of motion.  Neurological: She is alert and oriented to person, place, and time. No cranial nerve deficit.       Assessment/Plan:   1. Blood pressure - Fairly normal at today's visit and looking back at the last 6 months they are not elevated consistently. Given her reported low at home would not increase therapy at this time but she will follow up closely with her PCP for recheck.  2. DM II - Did check a HgA1c at today's visit given that she was overdue. It had increased to 7.7 from 6.5 although likely this is from lack of insulin for about 6 weeks and her meter indicates that she is now under better control and just advising steady supply of insulin and recheck closely.   3. Disposition - Will forward message to PCP to switch medications to [facility] pharmacy. Did send in rx for strips and needles (she had been buying these out of pocket as she had no rx for them). She will return in 3-4 months with PCP if possible. No changes to medications at today's visit.

## 2013-08-10 NOTE — Patient Instructions (Addendum)
We will make sure that you can get your strips and test your sugars to keep track of them. We will also switch all of your medicines to [facility].   You sugar levels have gone up a little but I think that since you are back on your insulin that this will help a lot and your meter for the last month is at goal!  If your breathing still bothers you in the future come back and see [place] again.   We will see you back in about 3 months with your regular doctor or as needed. Call us with problems or questions at [phone].

## 2013-08-10 NOTE — Patient Instructions (Signed)
Follow up next visit with doctor.  Check blood sugar when you have a nightmare or fast heartbeat.  Call us if you have a blood sugar under 70 or have problems getting strips, syringes   Lupita Leash [phone]

## 2013-08-10 NOTE — Progress Notes (Signed)
Diabetes Self-Management Education  Visit Number: Follow-up 2  08/10/2013 [name], identified by name and date of birth, is a 62 y.o. female with Type 2 diabetes        ASSESSMENT  Patient Concerns:     Last menstrual period 07/12/1984. Weight and BMI in office visit for today.  Lab Results: LDL Cholesterol  Date Value Range Status  05/08/2013 103* 0 - 99 mg/dL Final           Hemoglobin A1C  Date Value Range Status  08/10/2013 7.7   Final  10/09/2011 8.6* <5.7 % Final     (NOTE)                                                                              Family History  Problem Relation Age of Onset  . Depression Daughter 69  . Osteoarthritis Maternal Grandmother   . Lung cancer Mother   . Diabetes Father   . Breast cancer Sister   . Pulmonary embolism Brother   . Colon cancer Neg Hx   . Heart attack Brother 95   History  Substance Use Topics  . Smoking status: Never Smoker   . Smokeless tobacco: Never Used  . Alcohol Use: No   Assessment comments: skips meals due to feeling full, reports symptoms of hypoglycemia( nightmares and fast heartbeat) that she didn't realize could be low blood sugars. Using honey instead of artificial sweetener not realizing it raises blood sugars the same as sugar.    Diet Recall: cereal and 2% milk or nothing or just fruit, snacks on fruit,peanut butter crackers or nuts, meals at 9am, sometimes 2 Pm and  6 pm meals with l;ean chicken, [place] or fish and 2-3 vegetables.  Individualized Plan for Diabetes Self-Management Training:  Patient individualized diabetes plan includes: Nutrition, monitoring, how to handle highs and lows, and  Dealing daily with diabetes   Education Topics Reviewed with Patient Today:  Topic Points Discussed  Disease State    Nutrition Management Food label reading, portion sizes and measuring food.;Reviewed blood glucose goals for pre and post meals and how to evaluate the patients' food intake on their  blood glucose level.;Meal timing in regards to the patients' current diabetes medication.  Physical Activity and Exercise    Medications    Monitoring Purpose and frequency of SMBG.  Acute Complications Taught treatment of hypoglycemia - the 15 rule.  Chronic Complications    Psychosocial Adjustment    Goal Setting    Preconception Care (if applicable)      PATIENTS GOALS   Learning Objective(s):     Goal The patient agrees to:  Nutrition    Physical Activity    Medications    Monitoring test my blood glucose as discussed when having symptoms of low blood sugar- nightmares or fast heartbeat  Problem Solving    Reducing Risk    Health Coping     Patient Self-Evaluation of Goals (Subsequent Visits)  Goal The patient rates self as meeting goals (% of time)  Nutrition   50% on eating healthy foods in healthy portions  Physical Activity    Medications    Monitoring  100% on bringing meter  Problem Solving    Reducing Risk    Health Coping       PERSONALIZED PLAN / SUPPORT  Self-Management Support:  Doctor's office;Friends;Family;CDE visits ______________________________________________________________________  Outcomes  Expected Outcomes:  Demonstrated interest in learning. Expect positive outcomes Self-Care Barriers:  Lack of transportation;Lack of material resources Education material provided: yes on signs and symptoms of hypoglycemia If problems or questions, patient to contact team via:  Phone Time in: 1010     Time out: 1040 Future DSME appointment: - 2 months   [name], [name] 08/10/2013 11:02 AM

## 2013-08-11 MED ORDER — "INSULIN SYRINGE-NEEDLE U-100 31G X 15/64"" 0.5 ML MISC": 1.0000 | Freq: Two times a day (BID) | Status: DC

## 2013-08-13 NOTE — Progress Notes (Signed)
Case discussed with Dr. Kollar soon after the resident saw the patient.  We reviewed the resident's history and exam and pertinent patient test results.  I agree with the assessment, diagnosis, and plan of care documented in the resident's note. 

## 2013-08-14 ENCOUNTER — Telehealth: Payer: Self-pay | Admitting: Dietician

## 2013-08-14 NOTE — Telephone Encounter (Addendum)
[facility] told her the strips to check her blood sugar will cost her $200.00- 300.00. (She cannot get them at [facility] pharmacy because they do not bill Medicare B.)  Part B says they will not cover per [facility] who also gave me a [facility] [employer] [phone] part D phone number. Cost for strips is $150.76. Called [employer] with whom patient is insured, they told me that no diabetes testing supplies are covered without a PA. This is unusual unless it because we are requesting accu cek rather than their preferred brand. . Noted that [facility] insurance can be a Medicare Advantage( which would be A, B and D in one and could be a reason her Medicare B did not cover her testing supplies)  or Medicare D plan. Patient is not sure which she has with them. asked her to be sure her card is copied at her next office visit.     PA filled out, signed by dr. Rogelia Boga and faxed back to [facility] on 08-15-13.

## 2013-08-16 NOTE — Telephone Encounter (Signed)
Told by [facility] that the PA is under review until 08-18-13. They also said that the reason the strips and lancets are not covered by them is patient has  Medicare part D with them. usually it is Medicare part B that pays for testing supplies.   Called [facility]: they reran the prescription and the  rejection says they require 6 months or 5 refills, they will not accept it with more. She ran it through with 5 refills and it went through with 0$ copay. The pharmacist says Medicare requires a new Rx for testing supplies every 6 months and suggests we write our prescriptions for only 5 refills in the future.

## 2013-10-16 ENCOUNTER — Other Ambulatory Visit: Payer: Self-pay | Admitting: *Deleted

## 2013-10-16 DIAGNOSIS — I10 Essential (primary) hypertension: Secondary | ICD-10-CM

## 2013-10-16 NOTE — Telephone Encounter (Signed)
She got year supply 05/08/13

## 2013-10-16 NOTE — Telephone Encounter (Signed)
Talked with [name] at [facility] - called in to [facility] 05/2013.

## 2013-10-17 ENCOUNTER — Other Ambulatory Visit: Payer: Self-pay

## 2013-10-17 DIAGNOSIS — Z853 Personal history of malignant neoplasm of breast: Secondary | ICD-10-CM

## 2013-10-17 DIAGNOSIS — Z1231 Encounter for screening mammogram for malignant neoplasm of breast: Secondary | ICD-10-CM

## 2013-10-30 ENCOUNTER — Ambulatory Visit (INDEPENDENT_AMBULATORY_CARE_PROVIDER_SITE_OTHER): Payer: Medicare Other | Admitting: Internal Medicine

## 2013-10-30 ENCOUNTER — Encounter: Payer: Self-pay | Admitting: Internal Medicine

## 2013-10-30 VITALS — BP 124/67 | HR 68 | Temp 96.9°F | Ht 61.0 in | Wt 220.9 lb

## 2013-10-30 DIAGNOSIS — E1142 Type 2 diabetes mellitus with diabetic polyneuropathy: Secondary | ICD-10-CM | POA: Diagnosis not present

## 2013-10-30 DIAGNOSIS — E785 Hyperlipidemia, unspecified: Secondary | ICD-10-CM | POA: Diagnosis not present

## 2013-10-30 DIAGNOSIS — R269 Unspecified abnormalities of gait and mobility: Secondary | ICD-10-CM | POA: Diagnosis not present

## 2013-10-30 DIAGNOSIS — G5600 Carpal tunnel syndrome, unspecified upper limb: Secondary | ICD-10-CM | POA: Diagnosis not present

## 2013-10-30 DIAGNOSIS — I10 Essential (primary) hypertension: Secondary | ICD-10-CM | POA: Diagnosis not present

## 2013-10-30 DIAGNOSIS — E1149 Type 2 diabetes mellitus with other diabetic neurological complication: Secondary | ICD-10-CM | POA: Diagnosis not present

## 2013-10-30 DIAGNOSIS — K219 Gastro-esophageal reflux disease without esophagitis: Secondary | ICD-10-CM | POA: Diagnosis not present

## 2013-10-30 DIAGNOSIS — G894 Chronic pain syndrome: Secondary | ICD-10-CM | POA: Diagnosis not present

## 2013-10-30 DIAGNOSIS — I2510 Atherosclerotic heart disease of native coronary artery without angina pectoris: Secondary | ICD-10-CM | POA: Diagnosis not present

## 2013-10-30 DIAGNOSIS — E669 Obesity, unspecified: Secondary | ICD-10-CM | POA: Diagnosis not present

## 2013-10-30 DIAGNOSIS — M6740 Ganglion, unspecified site: Secondary | ICD-10-CM | POA: Diagnosis not present

## 2013-10-30 DIAGNOSIS — I872 Venous insufficiency (chronic) (peripheral): Secondary | ICD-10-CM | POA: Diagnosis not present

## 2013-10-30 DIAGNOSIS — G8929 Other chronic pain: Secondary | ICD-10-CM | POA: Diagnosis not present

## 2013-10-30 DIAGNOSIS — Z0000 Encounter for general adult medical examination without abnormal findings: Secondary | ICD-10-CM

## 2013-10-30 NOTE — Addendum Note (Signed)
Addended by: Larey Dresser A on: 10/30/2013 01:12 PM   Modules accepted: Orders

## 2013-10-30 NOTE — Assessment & Plan Note (Signed)
B wrist splints

## 2013-10-30 NOTE — Assessment & Plan Note (Addendum)
The occupied most of our visit today. She has had progressive B lower back pain that radiates into her B legs. It is a sharp, burning, snapping pain. Worse when getting up from chair but also hurts in bed and sitting. This pain will grab her when she is walking, causing her to fall and it is challenging to get up off the floor. No bowel / bladder incontinence. Required both hands and cane to rise from seated position.   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 She was referred to PT at that time. She insists that she saw ortho and was told that a lumbar injection would not help her back / leg pain. There are two ortho notes in [place] - one for wrist and one for trochanteric bursitis. Glenda called ortho and one new note from 7/13 sent over for intraarticular hip injection. I cannot find any documentation that her MRI findings were discussed with ortho.   I have stressed that seeing PMR / IR / pain therapy is the best option a this time. Will start with PMR referral to see if injection is indicated. Will not repeat MRI at this time as pain is same, just worse.  [place] - paper RX for B wrist splints.   Order for TENS electrodes.

## 2013-10-30 NOTE — Assessment & Plan Note (Signed)
BP is well controlled today. Cont current meds of Norvasc 10, Clonidine 0.2, Losartan 50, toprol 100.

## 2013-10-30 NOTE — Assessment & Plan Note (Signed)
Refused flu

## 2013-10-30 NOTE — Patient Instructions (Signed)
I will get all orthopedic records.  I still think a second opinion to see if a shot in back is worthwhile.  I will review the records and then make the referral.

## 2013-10-30 NOTE — Progress Notes (Signed)
   Subjective:    Patient ID: [name], female    DOB: December 24, 1950, 63 y.o.   MRN: [id number]  HPI  Please see the A&P for the status of the pt's chronic medical problems.  Review of Systems  Gastrointestinal: Positive for constipation.  Genitourinary:       No bowel / bladder incontinence  Musculoskeletal: Positive for back pain, gait problem and myalgias.  Neurological: Positive for dizziness and light-headedness.  Psychiatric/Behavioral: Positive for sleep disturbance.       Objective:   Physical Exam  Constitutional: She is oriented to person, place, and time. She appears well-developed and well-nourished. No distress.  HENT:  Head: Normocephalic and atraumatic.  Right Ear: External ear normal.  Left Ear: External ear normal.  Nose: Nose normal.  Eyes: Conjunctivae are normal. Pupils are equal, round, and reactive to light.  Neurological: She is alert and oriented to person, place, and time.  Skin: She is not diaphoretic.  Psychiatric: She has a normal mood and affect. Her behavior is normal. Judgment and thought content normal.          Assessment & Plan:

## 2013-11-05 ENCOUNTER — Encounter: Payer: Self-pay | Admitting: Physical Medicine & Rehabilitation

## 2013-11-08 ENCOUNTER — Ambulatory Visit: Payer: Medicare Other

## 2013-11-13 ENCOUNTER — Encounter: Payer: Self-pay | Admitting: Internal Medicine

## 2013-11-15 ENCOUNTER — Encounter: Payer: Self-pay | Admitting: Internal Medicine

## 2013-11-20 ENCOUNTER — Encounter: Payer: Self-pay | Admitting: Internal Medicine

## 2013-11-23 ENCOUNTER — Other Ambulatory Visit: Payer: Self-pay | Admitting: *Deleted

## 2013-11-23 DIAGNOSIS — E1140 Type 2 diabetes mellitus with diabetic neuropathy, unspecified: Secondary | ICD-10-CM

## 2013-11-23 NOTE — Telephone Encounter (Signed)
I Rx'd year's supply on 05/08/13. Should have refills.

## 2013-11-23 NOTE — Telephone Encounter (Signed)
[facility] informed.

## 2013-11-29 ENCOUNTER — Ambulatory Visit: Payer: Medicare Other | Admitting: Dietician

## 2013-11-30 ENCOUNTER — Ambulatory Visit: Payer: Medicare Other

## 2013-12-06 ENCOUNTER — Other Ambulatory Visit: Payer: Self-pay | Admitting: Physical Medicine and Rehabilitation

## 2013-12-06 DIAGNOSIS — M545 Low back pain, unspecified: Secondary | ICD-10-CM

## 2013-12-13 ENCOUNTER — Ambulatory Visit
Admission: RE | Admit: 2013-12-13 | Discharge: 2013-12-13 | Disposition: A | Discharge status: Home / Self Care | Payer: Medicare Other | Source: Ambulatory Visit

## 2013-12-13 DIAGNOSIS — Z1231 Encounter for screening mammogram for malignant neoplasm of breast: Secondary | ICD-10-CM

## 2013-12-13 DIAGNOSIS — Z853 Personal history of malignant neoplasm of breast: Secondary | ICD-10-CM

## 2013-12-14 ENCOUNTER — Ambulatory Visit: Payer: Medicare Other | Admitting: Physical Medicine & Rehabilitation

## 2013-12-17 ENCOUNTER — Encounter: Payer: Self-pay | Admitting: Cardiology

## 2013-12-17 ENCOUNTER — Ambulatory Visit (INDEPENDENT_AMBULATORY_CARE_PROVIDER_SITE_OTHER): Payer: Medicare Other | Admitting: Cardiology

## 2013-12-17 VITALS — BP 138/77 | HR 69 | Ht 61.0 in | Wt 222.4 lb

## 2013-12-17 DIAGNOSIS — I872 Venous insufficiency (chronic) (peripheral): Secondary | ICD-10-CM | POA: Diagnosis not present

## 2013-12-17 DIAGNOSIS — I10 Essential (primary) hypertension: Secondary | ICD-10-CM

## 2013-12-17 DIAGNOSIS — I2510 Atherosclerotic heart disease of native coronary artery without angina pectoris: Secondary | ICD-10-CM

## 2013-12-17 NOTE — Patient Instructions (Signed)
The current medical regimen is effective;  continue present plan and medications.  Follow up in 1 year with Dr Hochrein.  You will receive a letter in the mail 2 months before you are due.  Please call us when you receive this letter to schedule your follow up appointment.  

## 2013-12-17 NOTE — Progress Notes (Signed)
The patient presents for follow up of  CAD. She is limited in activities and walks with a cane with back and leg pain.  With her activity she denies any chest pressure, neck or arm discomfort.  She's not describing palpitations, presyncope or syncope. She's had chronic lower extremity edema right greater than left. Unfortunately she's had no weight loss.   Allergies  Allergen Reactions  . Lisinopril Other (See Comments)    Chronic cough secondary to ACE  . Oxycodone-Acetaminophen Hives and Itching    flushing    Current Outpatient Prescriptions  Medication Sig Dispense Refill  . ACCU-CHEK FASTCLIX LANCETS MISC 1 each by Does not apply route 3 (three) times daily before meals. Dx code- 250.00  100 each  11  . amLODipine (NORVASC) 10 MG tablet TAKE ONE TABLET BY MOUTH EVERY DAY  90 tablet  3  . aspirin EC 81 MG tablet Take 81 mg by mouth daily.      Marland Kitchen atorvastatin (LIPITOR) 80 MG tablet TAKE ONE TABLET BY MOUTH EVERY DAY  30 tablet  11  . Bilberry, Vaccinium myrtillus, (BILBERRY PO) Take 1,000 mg by mouth daily.        Marland Kitchen BLACK COHOSH PO Take 1 capsule by mouth daily.       . Cholecalciferol (VITAMIN D3) 400 UNITS CAPS Take 400 Int'l Units by mouth at bedtime.       . cloNIDine (CATAPRES) 0.2 MG tablet TAKE ONE TABLET BY MOUTH TWICE DAILY  180 tablet  3  . co-enzyme Q-10 50 MG capsule Take 50 mg by mouth daily.      Marland Kitchen gabapentin (NEURONTIN) 300 MG capsule TAKE ONE CAPSULE BY MOUTH TWICE DAILY  60 capsule  11  . glucose 4 GM chewable tablet Chew 4 tablets (16 g total) by mouth as needed for low blood sugar.  50 tablet  12  . glucose blood (ACCU-CHEK SMARTVIEW) test strip Check blood sugar up to 3 times daily  Dx code 250.00. Insulin dependent  100 each  11  . ibuprofen (ADVIL,MOTRIN) 800 MG tablet Take 800 mg by mouth every 8 (eight) hours as needed. For pain      . insulin NPH-regular (HUMULIN 70/30) (70-30) 100 UNIT/ML injection Inject 28 units 30 minutes before breakfast and 28 units 30  minutes before dinner.  20 mL  5  . Insulin Syringe-Needle U-100 31G X 15/64" 0.5 ML MISC 1 applicator by Does not apply route 2 (two) times daily. Dx code 250.00  100 each  12  . IRON PO Take 27 mg by mouth daily.      Marland Kitchen loratadine (CLARITIN) 10 MG tablet TAKE ONE TABLET BY MOUTH ONCE DAILY FOR ALLERGIES  90 tablet  3  . LORazepam (ATIVAN) 1 MG tablet Take 1 mg by mouth at bedtime as needed. For anxiety or sleep      . losartan (COZAAR) 50 MG tablet Take 1 tablet (50 mg total) by mouth daily.  30 tablet  11  . metFORMIN (GLUCOPHAGE) 500 MG tablet Take 1 tablet (500 mg total) by mouth 2 (two) times daily with a meal.  180 tablet  3  . metoprolol succinate (TOPROL-XL) 100 MG 24 hr tablet Take 1 tablet (100 mg total) by mouth daily. Take with or immediately following a meal.  90 tablet  3  . NITRO-DUR 0.4 MG/HR patch APPLY ONE PATCH TOPICALLY EVERYDAY  90 patch  3  . nitroGLYCERIN (NITROSTAT) 0.4 MG SL tablet Place 1 tablet (0.4 mg  total) under the tongue every 5 (five) minutes as needed for chest pain.  25 tablet  3  . Olopatadine HCl (PATADAY) 0.2 % SOLN Place 1 drop into both eyes daily as needed. For allergies      . omeprazole (PRILOSEC) 40 MG capsule Take 1 capsule (40 mg total) by mouth daily.  30 capsule  11  . polyethylene glycol powder (GLYCOLAX/MIRALAX) powder Take 17 g by mouth daily as needed. For constipation      . potassium chloride SA (K-DUR,KLOR-CON) 20 MEQ tablet Take 1 tablet (20 mEq total) by mouth 2 (two) times daily.  90 tablet  3  . promethazine (PHENERGAN) 25 MG tablet Take 1 tablet (25 mg total) by mouth every 6 (six) hours as needed for nausea.  30 tablet  2  . Red Yeast Rice 600 MG TABS Take by mouth daily.      . Thiamine HCl (VITAMIN B-1) 250 MG tablet Take 250 mg by mouth daily.      . traMADol (ULTRAM) 50 MG tablet Take 1 tablet (50 mg total) by mouth every 6 (six) hours as needed for pain.  60 tablet  2   No current facility-administered medications for this visit.      Past Medical History  Diagnosis Date  . Chest pain 11/07    cath 11/07 : LAD luminal irregularities, ramus intermediate with superior branch 70% stenosis, circumflex 50% stenosis, marginal 50% stenosis, well preserved ejection fraction. Stress test Jan 2013 : suggestion of lateral wall ischemia, likely artifact  . Hypertension     Requires 5 drug therapy and still difficult to control.  Marland Kitchen. GERD (gastroesophageal reflux disease)   . Hyperlipidemia     On daily statin  . Obesity     Morbid. HAs worked with Lupita Leashonna T previously.  . Colonic polyp 1995    Colonoscopies 1994, 2000, and 02/02/2011. Last had 9 polyps - Tubular adenoma and tubulovillous was the pathology results without high grade dysplasia  . OSA (obstructive sleep apnea) 02/16/2007    Moderate AHI 35.6, O2 sat decreased to 65%, CPAP titration to 16 with AHI of 3.6.  Marland Kitchen. Chronic pain     MR 08/07/10 :  Spondylosis L4-5 with B foraminal narrowing and L4 nerve root enchroachment B.  . Seasonal allergies     seasonal  . Iron deficiency anemia     Ferritin was 12 in 2012. on FeSo4. Has had colon and EGD 02/02/11 that showed mild gastritis and colon polyps.  . Chronic venous insufficiency 2012    Has had full W/U incl ECHO, LFT's, Cr, Umicro, TSH, BNP. Symptomatic treatment.  . Diastolic CHF, chronic     NL EF by echo 01/2012, Gr I dd  . Arthritis     knees  . 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 S/p lumpectomy and radiation.  . Diabetes mellitus type II     Insulin dependent. Has worked with Lupita Leashonna R previously.    Past Surgical History  Procedure Laterality Date  . Colonoscopy w/ polypectomy  1994, 2000, 2012  . Total abdominal hysterectomy  1995    2/2 uterine cancer  . Carotid dopplers  08/24/2007    Normal. Done 2/2 dizziness.  . Cardiac catheterization  2007    Non obstructing CAD - [facility]  . Breast lumpectomy w/ needle localization  2006    34 Chemo RX    ROS: As  stated in the HPI and  negative for all other systems.   PHYSICAL EXAM BP 138/77  Pulse 69  Ht 5\' 1"  (1.549 m)  Wt 222 lb 6.4 oz (100.88 kg)  BMI 42.04 kg/m2  LMP 07/12/1984 GENERAL:  Well appearing HEENT:  Pupils equal round and reactive, fundi not visualized, oral mucosa unremarkable NECK:  No jugular venous distention, waveform within normal limits, carotid upstroke brisk and symmetric, no bruits, no thyromegaly LYMPHATICS:  No cervical, inguinal adenopathy LUNGS:  Clear to auscultation bilaterally BACK:  No CVA tenderness HEART:  PMI not displaced or sustained,S1 and S2 within normal limits, no S3, no S4, no clicks, no rubs, no murmurs ABD:  Flat, positive bowel sounds normal in frequency in pitch, no bruits, no rebound, no guarding, no midline pulsatile mass, no hepatomegaly, no splenomegaly, obese EXT:  2 plus pulses throughout, mild right greater than left leg edema, no cyanosis no clubbing  EKG:  Sinus rhythm, rate 69, left axis deviation, no acute ST-T wave changes. First degree AV bloock  12/17/2013  ASSESSMENT AND PLAN   CAD:  She has had no new symptoms since a stress test last January 2013. No further cardiovascular testing is suggested.  HTN:  Her blood pressure is well controlled. She will continue on the meds as listed.  HYPERLIPIDEMIA:  I reviewed her last lipids in her LDL was 103.  This is improved from previous.  She will continue on the meds as listed.   DIABETES:  Her hemoglobin A1c was 7.7 .  She is following with Dr. Lynnae January.   OBESITY:  The patient understands the need to lose weight with diet and exercise.   We talked about this.  She already reports good eating habits.  I might suggest a food diary to help her count calories.

## 2013-12-18 ENCOUNTER — Ambulatory Visit: Payer: Medicare Other | Admitting: Internal Medicine

## 2013-12-19 ENCOUNTER — Ambulatory Visit
Admission: RE | Admit: 2013-12-19 | Discharge: 2013-12-19 | Disposition: A | Discharge status: Home / Self Care | Payer: Medicare Other | Source: Ambulatory Visit | Attending: Physical Medicine and Rehabilitation | Admitting: Physical Medicine and Rehabilitation

## 2013-12-19 DIAGNOSIS — M545 Low back pain, unspecified: Secondary | ICD-10-CM

## 2013-12-19 DIAGNOSIS — M4310 Spondylolisthesis, site unspecified: Secondary | ICD-10-CM | POA: Diagnosis not present

## 2013-12-19 DIAGNOSIS — M47817 Spondylosis without myelopathy or radiculopathy, lumbosacral region: Secondary | ICD-10-CM | POA: Diagnosis not present

## 2013-12-19 DIAGNOSIS — M5126 Other intervertebral disc displacement, lumbar region: Secondary | ICD-10-CM | POA: Diagnosis not present

## 2013-12-26 DIAGNOSIS — G894 Chronic pain syndrome: Secondary | ICD-10-CM | POA: Diagnosis not present

## 2013-12-26 DIAGNOSIS — M47817 Spondylosis without myelopathy or radiculopathy, lumbosacral region: Secondary | ICD-10-CM | POA: Diagnosis not present

## 2013-12-26 DIAGNOSIS — IMO0002 Reserved for concepts with insufficient information to code with codable children: Secondary | ICD-10-CM | POA: Diagnosis not present

## 2013-12-26 DIAGNOSIS — M545 Low back pain, unspecified: Secondary | ICD-10-CM | POA: Diagnosis not present

## 2013-12-26 DIAGNOSIS — M48062 Spinal stenosis, lumbar region with neurogenic claudication: Secondary | ICD-10-CM | POA: Diagnosis not present

## 2013-12-27 DIAGNOSIS — M48062 Spinal stenosis, lumbar region with neurogenic claudication: Secondary | ICD-10-CM | POA: Diagnosis not present

## 2013-12-27 DIAGNOSIS — M545 Low back pain, unspecified: Secondary | ICD-10-CM | POA: Diagnosis not present

## 2013-12-27 DIAGNOSIS — IMO0002 Reserved for concepts with insufficient information to code with codable children: Secondary | ICD-10-CM | POA: Diagnosis not present

## 2013-12-27 DIAGNOSIS — G894 Chronic pain syndrome: Secondary | ICD-10-CM | POA: Diagnosis not present

## 2013-12-31 ENCOUNTER — Encounter: Payer: Self-pay | Admitting: Internal Medicine

## 2014-01-02 ENCOUNTER — Other Ambulatory Visit: Payer: Self-pay | Admitting: Internal Medicine

## 2014-01-03 ENCOUNTER — Encounter: Payer: Self-pay | Admitting: Internal Medicine

## 2014-01-03 ENCOUNTER — Ambulatory Visit (INDEPENDENT_AMBULATORY_CARE_PROVIDER_SITE_OTHER): Payer: Medicare Other | Admitting: Internal Medicine

## 2014-01-03 ENCOUNTER — Telehealth: Payer: Self-pay | Admitting: *Deleted

## 2014-01-03 VITALS — BP 142/76 | HR 90 | Temp 97.3°F | Ht 61.0 in | Wt 222.8 lb

## 2014-01-03 DIAGNOSIS — E1149 Type 2 diabetes mellitus with other diabetic neurological complication: Secondary | ICD-10-CM | POA: Diagnosis not present

## 2014-01-03 DIAGNOSIS — E1142 Type 2 diabetes mellitus with diabetic polyneuropathy: Secondary | ICD-10-CM | POA: Diagnosis not present

## 2014-01-03 DIAGNOSIS — I2510 Atherosclerotic heart disease of native coronary artery without angina pectoris: Secondary | ICD-10-CM

## 2014-01-03 DIAGNOSIS — G8929 Other chronic pain: Secondary | ICD-10-CM | POA: Diagnosis not present

## 2014-01-03 DIAGNOSIS — E1140 Type 2 diabetes mellitus with diabetic neuropathy, unspecified: Secondary | ICD-10-CM

## 2014-01-03 DIAGNOSIS — I10 Essential (primary) hypertension: Secondary | ICD-10-CM

## 2014-01-03 DIAGNOSIS — J329 Chronic sinusitis, unspecified: Secondary | ICD-10-CM | POA: Diagnosis not present

## 2014-01-03 DIAGNOSIS — R5381 Other malaise: Secondary | ICD-10-CM | POA: Diagnosis not present

## 2014-01-03 DIAGNOSIS — R5383 Other fatigue: Secondary | ICD-10-CM | POA: Diagnosis not present

## 2014-01-03 DIAGNOSIS — E785 Hyperlipidemia, unspecified: Secondary | ICD-10-CM | POA: Diagnosis not present

## 2014-01-03 DIAGNOSIS — I872 Venous insufficiency (chronic) (peripheral): Secondary | ICD-10-CM | POA: Diagnosis not present

## 2014-01-03 DIAGNOSIS — E669 Obesity, unspecified: Secondary | ICD-10-CM | POA: Diagnosis not present

## 2014-01-03 DIAGNOSIS — G4733 Obstructive sleep apnea (adult) (pediatric): Secondary | ICD-10-CM | POA: Diagnosis not present

## 2014-01-03 DIAGNOSIS — J309 Allergic rhinitis, unspecified: Secondary | ICD-10-CM | POA: Diagnosis not present

## 2014-01-03 LAB — POCT GLYCOSYLATED HEMOGLOBIN (HGB A1C): HEMOGLOBIN A1C: 7.6

## 2014-01-03 LAB — GLUCOSE, CAPILLARY: Glucose-Capillary: 101 mg/dL — ABNORMAL HIGH (ref 70–99)

## 2014-01-03 MED ORDER — AMOXICILLIN-POT CLAVULANATE 875-125 MG PO TABS: 1.0000 | ORAL_TABLET | Freq: Two times a day (BID) | ORAL | Status: DC

## 2014-01-03 NOTE — Progress Notes (Signed)
Subjective:    Patient ID: [name], female    DOB: Jan 21, 1951, 63 y.o.   MRN: [id number]  HPI Comments: 63 y.o Past Medical History Chest pain, OSA, CAD, Hypertension (BP 155/86), GERD (gastroesophageal reflux disease), Hyperlipidemia,  Obesity, Colonic polyp,  OSA (obstructive sleep apnea),  Chronic pain, Iron deficiency anemia, Chronic venous insufficiency, diastolic CHF (chronic), arthritis, history of breast cancer s/p lumpectomy and radiation, Diabetes mellitus type II (HA1C 7.6 today with cbg 101), h/o bells palsy.   1) She presents today for elevated BP 155/86> 145/87, 150/86, 142/76. Her blood pressure has been running high at home (220/100 manually) and she is on Norvasc 10, Clonindine 0.2 mg bid, Cozaar 50 mg, Toprol XL 100 mg and taking the medication.  She is checking her BP manually and automatically.    2)She c/o of sinus pressure throughout her face.  She has been having h/a's since last week, blurry vision and dizziness (yesterday and today) or "feeling drunk headed/room spinning/drunk staggering", nasal congestion.  Her h/a were waking her up at night and at the top of her head.  H/a's were not the worse h/a of her life but she did feel like the top of her head was going to "blow off".  She denies radiation of pain.  Max h/a pain was 8/10 currently h/a pain is 4/10 "frontal and nagging".  She tried Ibuprofen for h/a relief (2 pills) with min. Relief.  Orthostatics negative today.   She denies taking Afrin/sudafed.  She has only been taking Zyrtec. This week her headache is somewhat better and she is still dizzy   3) She has lower extremity swelling and has noticed weight gain.  Weight is up from 215 to 222 today.    4) She states she started putting her Nitro patch on again for angina which is helping                            Review of Systems  Constitutional:       Hot/cold chills  HENT: Positive for nosebleeds, rhinorrhea and sinus pressure.        Yellow/green  nasal drainage Nosebleed last week resolved   Cardiovascular: Positive for leg swelling.       Objective:   Physical Exam  Nursing note and vitals reviewed. Constitutional: She is oriented to person, place, and time. She appears well-developed and well-nourished. She is cooperative. No distress.  HENT:  Head: Normocephalic and atraumatic.  Nose: Right sinus exhibits maxillary sinus tenderness and frontal sinus tenderness. Left sinus exhibits maxillary sinus tenderness and frontal sinus tenderness.  Mouth/Throat: Oropharynx is clear and moist. No oropharyngeal exudate.  +ethmoid ttp  Eyes: Conjunctivae are normal. Pupils are equal, round, and reactive to light. Right eye exhibits no discharge. Left eye exhibits no discharge. No scleral icterus.  Cardiovascular: Normal rate, regular rhythm, S1 normal, S2 normal and normal heart sounds.   No murmur heard. 2+ lower extremity edema   Pulmonary/Chest: Effort normal and breath sounds normal. No respiratory distress. She has no wheezes.  Abdominal: Soft. Bowel sounds are normal. There is no tenderness.  Neurological: She is alert and oriented to person, place, and time. She has normal strength. Gait normal.  CN 2-12 intact   Skin: Skin is warm, dry and intact. No rash noted. She is not diaphoretic.  Psychiatric: She has a normal mood and affect. Her speech is normal and behavior is normal. Judgment and thought  content normal. Cognition and memory are normal.          Assessment & Plan:  F/u in 1 week

## 2014-01-03 NOTE — Assessment & Plan Note (Signed)
Sinus ttp throughout likely contributing to h/a, dizziness (orthostatics negative), sinus pain likely causing elevated BP Will try Augmentin bid x 7 days  F/u in 1 week, sooner if needed

## 2014-01-03 NOTE — Assessment & Plan Note (Signed)
BP Readings from Last 3 Encounters:  01/03/14 142/76  12/17/13 138/77  10/30/13 124/67    Lab Results  Component Value Date   NA 138 05/08/2013   K 4.2 05/08/2013   CREATININE 0.86 05/08/2013    Assessment: Blood pressure control: mildly elevated Progress toward BP goal:  deteriorated Comments: BP likely elevated due to pain from sinusitis   Plan: Medications:  continue current medications Educational resources provided: brochure Self management tools provided: other (see comments) Other plans: f/u in 1 week. Continue to take same BP medications and log BP.  Bring in BP cuff at next visit

## 2014-01-03 NOTE — Telephone Encounter (Signed)
Pt calls and states her BP has been recently elevated, today it is 220/90 "i feel wonderful but i am lite-headed and dizzy". Pt is given 1445 appt w/ dr Aundra Dubin

## 2014-01-03 NOTE — Telephone Encounter (Signed)
Agree. Thanks

## 2014-01-03 NOTE — Assessment & Plan Note (Signed)
Lab Results  Component Value Date   HGBA1C 7.6 01/03/2014   HGBA1C 7.7 08/10/2013   HGBA1C 6.5 03/06/2013     Assessment: Diabetes control: fair control Progress toward A1C goal:   (stable) Comments: none  Plan: Medications:  continue current medications Home glucose monitoring: Frequency: 3 times a day Timing: before meals Instruction/counseling given: provided printed educational material Educational resources provided: brochure Self management tools provided: other (see comments) Other plans: f/u with PCP

## 2014-01-03 NOTE — Patient Instructions (Addendum)
General Instructions: Please take Augmentin every 12 hours for 7 days  Follow up in 1 week  Call or go to the ED if not feeling better.  Read info below   Treatment Goals:  Goals (1 Years of Data) as of 01/03/14         As of Today As of Today As of Today As of Today 12/17/13     Blood Pressure    . Blood Pressure < 140/90  142/76 150/86 145/87 155/86 138/77     Lifestyle    . Prevent Falls           Result Component    . HEMOGLOBIN A1C < 7.0  7.6        . LDL CALC < 100            Progress Toward Treatment Goals:  Treatment Goal 01/03/2014  Hemoglobin A1C (No Data)  Blood pressure deteriorated  Prevent falls -    Self Care Goals & Plans:  Self Care Goal 01/03/2014  Manage my medications take my medicines as prescribed; bring my medications to every visit; refill my medications on time  Monitor my health keep track of my blood glucose; keep track of my blood pressure; bring my glucose meter and log to each visit; bring my blood pressure log to each visit; keep track of my weight; check my feet daily  Eat healthy foods drink diet soda or water instead of juice or soda; eat more vegetables; eat foods that are low in salt; eat baked foods instead of fried foods; eat fruit for snacks and desserts  Be physically active find an activity I enjoy  Prevent falls -  Meeting treatment goals maintain the current self-care plan    Home Blood Glucose Monitoring 01/03/2014  Check my blood sugar 3 times a day  When to check my blood sugar before meals     Care Management & Community Referrals:  Referral 01/03/2014  Referrals made for care management support none needed  Referrals made to community resources none      Sinusitis Sinusitis is redness, soreness, and swelling (inflammation) of the paranasal sinuses. Paranasal sinuses are air pockets within the bones of your face (beneath the eyes, the middle of the forehead, or above the eyes). In healthy paranasal sinuses, mucus is able to  drain out, and air is able to circulate through them by way of your nose. However, when your paranasal sinuses are inflamed, mucus and air can become trapped. This can allow bacteria and other germs to grow and cause infection. Sinusitis can develop quickly and last only a short time (acute) or continue over a long period (chronic). Sinusitis that lasts for more than 12 weeks is considered chronic.  CAUSES  Causes of sinusitis include:  Allergies.  Structural abnormalities, such as displacement of the cartilage that separates your nostrils (deviated septum), which can decrease the air flow through your nose and sinuses and affect sinus drainage.  Functional abnormalities, such as when the small hairs (cilia) that line your sinuses and help remove mucus do not work properly or are not present. SYMPTOMS  Symptoms of acute and chronic sinusitis are the same. The primary symptoms are pain and pressure around the affected sinuses. Other symptoms include:  Upper toothache.  Earache.  Headache.  Bad breath.  Decreased sense of smell and taste.  A cough, which worsens when you are lying flat.  Fatigue.  Fever.  Thick drainage from your nose, which often is green  and may contain pus (purulent).  Swelling and warmth over the affected sinuses. DIAGNOSIS  Your caregiver will perform a physical exam. During the exam, your caregiver may:  Look in your nose for signs of abnormal growths in your nostrils (nasal polyps).  Tap over the affected sinus to check for signs of infection.  View the inside of your sinuses (endoscopy) with a special imaging device with a light attached (endoscope), which is inserted into your sinuses. If your caregiver suspects that you have chronic sinusitis, one or more of the following tests may be recommended:  Allergy tests.  Nasal culture A sample of mucus is taken from your nose and sent to a lab and screened for bacteria.  Nasal cytology A sample of mucus  is taken from your nose and examined by your caregiver to determine if your sinusitis is related to an allergy. TREATMENT  Most cases of acute sinusitis are related to a viral infection and will resolve on their own within 10 days. Sometimes medicines are prescribed to help relieve symptoms (pain medicine, decongestants, nasal steroid sprays, or saline sprays).  However, for sinusitis related to a bacterial infection, your caregiver will prescribe antibiotic medicines. These are medicines that will help kill the bacteria causing the infection.  Rarely, sinusitis is caused by a fungal infection. In theses cases, your caregiver will prescribe antifungal medicine. For some cases of chronic sinusitis, surgery is needed. Generally, these are cases in which sinusitis recurs more than 3 times per year, despite other treatments. HOME CARE INSTRUCTIONS   Drink plenty of water. Water helps thin the mucus so your sinuses can drain more easily.  Use a humidifier.  Inhale steam 3 to 4 times a day (for example, sit in the bathroom with the shower running).  Apply a warm, moist washcloth to your face 3 to 4 times a day, or as directed by your caregiver.  Use saline nasal sprays to help moisten and clean your sinuses.  Take over-the-counter or prescription medicines for pain, discomfort, or fever only as directed by your caregiver. SEEK IMMEDIATE MEDICAL CARE IF:  You have increasing pain or severe headaches.  You have nausea, vomiting, or drowsiness.  You have swelling around your face.  You have vision problems.  You have a stiff neck.  You have difficulty breathing. MAKE SURE YOU:   Understand these instructions.  Will watch your condition.  Will get help right away if you are not doing well or get worse. Document Released: 09/20/2005 Document Revised: 12/13/2011 Document Reviewed: 10/05/2011 [facility] Patient Information 2014 [place], [place].  Exercise to Lose Weight Exercise and a  healthy diet may help you lose weight. Your doctor may suggest specific exercises. EXERCISE IDEAS AND TIPS  Choose low-cost things you enjoy doing, such as walking, bicycling, or exercising to workout videos.  Take stairs instead of the elevator.  Walk during your lunch break.  Park your car further away from work or school.  Go to a gym or an exercise class.  Start with 5 to 10 minutes of exercise each day. Build up to 30 minutes of exercise 4 to 6 days a week.  Wear shoes with good support and comfortable clothes.  Stretch before and after working out.  Work out until you breathe harder and your heart beats faster.  Drink extra water when you exercise.  Do not do so much that you hurt yourself, feel dizzy, or get very short of breath. Exercises that burn about 150 calories:  Running 1  miles in 15 minutes.  Playing volleyball for 45 to 60 minutes.  Washing and waxing a car for 45 to 60 minutes.  Playing touch football for 45 minutes.  Walking 1  miles in 35 minutes.  Pushing a stroller 1  miles in 30 minutes.  Playing basketball for 30 minutes.  Raking leaves for 30 minutes.  Bicycling 5 miles in 30 minutes.  Walking 2 miles in 30 minutes.  Dancing for 30 minutes.  Shoveling snow for 15 minutes.  Swimming laps for 20 minutes.  Walking up stairs for 15 minutes.  Bicycling 4 miles in 15 minutes.  Gardening for 30 to 45 minutes.  Jumping rope for 15 minutes.  Washing windows or floors for 45 to 60 minutes. Document Released: 10/23/2010 Document Revised: 12/13/2011 Document Reviewed: 10/23/2010 [facility] Patient Information 2014 [place], [place].  Hypertension As your heart beats, it forces blood through your arteries. This force is your blood pressure. If the pressure is too high, it is called hypertension (HTN) or high blood pressure. HTN is dangerous because you may have it and not know it. High blood pressure may mean that your heart has to work  harder to pump blood. Your arteries may be narrow or stiff. The extra work puts you at risk for heart disease, stroke, and other problems.  Blood pressure consists of two numbers, a higher number over a lower, 110/72, for example. It is stated as "110 over 72." The ideal is below 120 for the top number (systolic) and under 80 for the bottom (diastolic). Write down your blood pressure today. You should pay close attention to your blood pressure if you have certain conditions such as:  Heart failure.  Prior heart attack.  Diabetes  Chronic kidney disease.  Prior stroke.  Multiple risk factors for heart disease. To see if you have HTN, your blood pressure should be measured while you are seated with your arm held at the level of the heart. It should be measured at least twice. A one-time elevated blood pressure reading (especially in the Emergency Department) does not mean that you need treatment. There may be conditions in which the blood pressure is different between your right and left arms. It is important to see your caregiver soon for a recheck. Most people have essential hypertension which means that there is not a specific cause. This type of high blood pressure may be lowered by changing lifestyle factors such as:  Stress.  Smoking.  Lack of exercise.  Excessive weight.  Drug/tobacco/alcohol use.  Eating less salt. Most people do not have symptoms from high blood pressure until it has caused damage to the body. Effective treatment can often prevent, delay or reduce that damage. TREATMENT  When a cause has been identified, treatment for high blood pressure is directed at the cause. There are a large number of medications to treat HTN. These fall into several categories, and your caregiver will help you select the medicines that are best for you. Medications may have side effects. You should review side effects with your caregiver. If your blood pressure stays high after you have  made lifestyle changes or started on medicines,   Your medication(s) may need to be changed.  Other problems may need to be addressed.  Be certain you understand your prescriptions, and know how and when to take your medicine.  Be sure to follow up with your caregiver within the time frame advised (usually within two weeks) to have your blood pressure rechecked and to  review your medications.  If you are taking more than one medicine to lower your blood pressure, make sure you know how and at what times they should be taken. Taking two medicines at the same time can result in blood pressure that is too low. SEEK IMMEDIATE MEDICAL CARE IF:  You develop a severe headache, blurred or changing vision, or confusion.  You have unusual weakness or numbness, or a faint feeling.  You have severe chest or abdominal pain, vomiting, or breathing problems. MAKE SURE YOU:   Understand these instructions.  Will watch your condition.  Will get help right away if you are not doing well or get worse. Document Released: 09/20/2005 Document Revised: 12/13/2011 Document Reviewed: 05/10/2008 [facility] Patient Information 2014 [place].  Type 2 Diabetes Mellitus, Adult Type 2 diabetes mellitus is a long-term (chronic) disease. In type 2 diabetes:  The pancreas does not make enough of a hormone called insulin.  The cells in the body do not respond as well to the insulin that is made.  Both of the above can happen. Normally, insulin moves sugars from food into tissue cells. This gives you energy. If you have type 2 diabetes, sugars cannot be moved into tissue cells. This causes high blood sugar (hyperglycemia).  HOME CARE  Have your hemoglobin A1c level checked twice a year. The level shows if your diabetes is under control or out of control.  Perform daily blood sugar testing as told by your doctor.  Check your ketone levels by testing your pee (urine) when you are sick and as told.  Take  your diabetes or insulin medicine as told by your doctor.  Never run out of insulin.  Adjust how much insulin you give yourself based on how many carbs (carbohydrates) you eat. Carbs are in many foods, such as fruits, vegetables, whole grains, and dairy products.  Have a healthy snack between every healthy meal. Have 3 meals and 3 snacks a day.  Lose weight if you are overweight.  Carry a medical alert card or wear your medical alert jewelry.  Carry a 15 gram carb snack with you at all times. Examples include:  Glucose pills, 3 or 4.  Glucose gel, 15 gram tube.  Raisins, 2 tablespoons (24 grams).  Jelly beans, 6.  Animal crackers, 8.  Sugar pop, 4 ounces (120 milliliters).  Gummy treats, 9.  Notice low blood sugar (hypoglycemia) symptoms, such as:  Shaking (tremors).  Decreased ability to think clearly.  Sweating.  Increased heart rate.  Headache.  Dry mouth.  Hunger.  Crabbiness (irritability).  Being worried or tense (anxiety).  Restless sleep.  A change in speech or coordination.  Confusion.  Treat low blood sugar right away. If you are alert and can swallow, follow the 15:15 rule:  Take 15 20 grams of a rapid-acting glucose or carb. This includes glucose gel, glucose pills, or 4 ounces (120 milliliters) of fruit juice, regular pop, or low-fat milk.  Check your blood sugar level after taking the glucose.  Take 15 20 grams of more glucose if the repeat blood sugar level is still 70 mg/dL (milligrams/deciliter) or below.  Eat a meal or snack within 1 hour of the blood sugar levels going back to normal.  Notice early symptoms of high blood sugar, such as:  Being really thirsty or drinking a lot (polydipsia).  Peeing (urinating) a lot (polyuria).  Do at least 150 minutes of physical activity a week or as told.  Split the 150 minutes of  activity up during the week. Do not do 150 minutes of activity in one day.  Perform exercises, such as weight  lifting, at least 2 times a week or as told.  Adjust your insulin or food intake as needed if you start a new exercise or sport.  Follow your sick day plan when you are not able to eat or drink as usual.  Avoid tobacco use.  Women who are not pregnant should drink no more than 1 drink a day. Men should drink no more than 2 drinks a day.  Only drink alcohol with food.  Ask your doctor if alcohol is safe for you.  Tell your doctor if you drink alcohol several times during the week.  See your doctor regularly.  Schedule an eye exam soon after you are diagnosed with diabetes. Schedule exams once every year.  Check your skin and feet every day. Check for cuts, bruises, redness, nail problems, bleeding, blisters, or sores. A doctor should do a foot exam once a year.  Brush your teeth and gums twice a day. Floss once a day. Visit your dentist regularly.  Share your diabetes plan with your workplace or school.  Stay up-to-date with shots that fight against diseases (immunizations).  Learn how to manage stress.  Get diabetes education and support as needed.  Ask your doctor for special help if:  You need help to maintain or improve how you to do things on your own.  You need help to maintain or improve the quality of your life.  You have foot or hand problems.  You have trouble cleaning yourself, dressing, eating, or doing physical activity. GET HELP RIGHT AWAY IF:  You have trouble breathing.  You have moderate to large ketone levels.  You are unable to eat food or drink fluids for more than 6 hours.  You feel sick to your stomach (nauseous) or throw up (vomit) for more than 6 hours.  Your blood sugar level is over 240 mg/dL.  There is a change in mental status.  You get another serious illness.  You have watery poop (diarrhea) for more than 6 hours.  You have been sick or have had a fever for 2 or more days and are not getting better.  You have pain when you are  physically active. MAKE SURE YOU:  Understand these instructions.  Will watch your condition.  Will get help right away if you are not doing well or get worse. Document Released: 06/29/2008 Document Revised: 07/11/2013 Document Reviewed: 01/19/2013 [facility] Patient Information 2014 [place], [place].  Amoxicillin; Clavulanic Acid tablets What is this medicine? AMOXICILLIN; CLAVULANIC ACID (a mox i SIL in; KLAV yoo lan ic AS id) is a penicillin antibiotic. It is used to treat certain kinds of bacterial infections. It will not work for colds, flu, or other viral infections. This medicine may be used for other purposes; ask your health care provider or pharmacist if you have questions. COMMON BRAND NAME(S): Augmentin What should I tell my health care provider before I take this medicine? They need to know if you have any of these conditions: -bowel disease, like colitis -kidney disease -liver disease -mononucleosis -an unusual or allergic reaction to amoxicillin, penicillin, cephalosporin, other antibiotics, clavulanic acid, other medicines, foods, dyes, or preservatives -pregnant or trying to get pregnant -breast-feeding How should I use this medicine? Take this medicine by mouth with a full glass of water. Follow the directions on the prescription label. Take at the start of a meal. Do  not crush or chew. If the tablet has a score line, you may cut it in half at the score line for easier swallowing. Take your medicine at regular intervals. Do not take your medicine more often than directed. Take all of your medicine as directed even if you think you are better. Do not skip doses or stop your medicine early. Talk to your pediatrician regarding the use of this medicine in children. Special care may be needed. Overdosage: If you think you have taken too much of this medicine contact a poison control center or emergency room at once. NOTE: This medicine is only for you. Do not share this  medicine with others. What if I miss a dose? If you miss a dose, take it as soon as you can. If it is almost time for your next dose, take only that dose. Do not take double or extra doses. What may interact with this medicine? -allopurinol -anticoagulants -birth control pills -methotrexate -probenecid This list may not describe all possible interactions. Give your health care provider a list of all the medicines, herbs, non-prescription drugs, or dietary supplements you use. Also tell them if you smoke, drink alcohol, or use illegal drugs. Some items may interact with your medicine. What should I watch for while using this medicine? Tell your doctor or health care professional if your symptoms do not improve. Do not treat diarrhea with over the counter products. Contact your doctor if you have diarrhea that lasts more than 2 days or if it is severe and watery. If you have diabetes, you may get a false-positive result for sugar in your urine. Check with your doctor or health care professional. Birth control pills may not work properly while you are taking this medicine. Talk to your doctor about using an extra method of birth control. What side effects may I notice from receiving this medicine? Side effects that you should report to your doctor or health care professional as soon as possible: -allergic reactions like skin rash, itching or hives, swelling of the face, lips, or tongue -breathing problems -dark urine -fever or chills, sore throat -redness, blistering, peeling or loosening of the skin, including inside the mouth -seizures -trouble passing urine or change in the amount of urine -unusual bleeding, bruising -unusually weak or tired -white patches or sores in the mouth or throat Side effects that usually do not require medical attention (report to your doctor or health care professional if they continue or are bothersome): -diarrhea -dizziness -headache -nausea,  vomiting -stomach upset -vaginal or anal irritation This list may not describe all possible side effects. Call your doctor for medical advice about side effects. You may report side effects to FDA at 1-800-FDA-1088. Where should I keep my medicine? Keep out of the reach of children. Store at room temperature below 25 degrees C (77 degrees F). Keep container tightly closed. Throw away any unused medicine after the expiration date. NOTE: This sheet is a summary. It may not cover all possible information. If you have questions about this medicine, talk to your doctor, pharmacist, or health care provider.  2014, [employer]. (2007-12-14 12:04:30)

## 2014-01-04 ENCOUNTER — Other Ambulatory Visit: Payer: Self-pay | Admitting: Internal Medicine

## 2014-01-04 NOTE — Progress Notes (Signed)
Case discussed with Dr. McLean at time of visit.  We reviewed the resident's history and exam and pertinent patient test results.  I agree with the assessment, diagnosis, and plan of care documented in the resident's note.  

## 2014-01-07 ENCOUNTER — Other Ambulatory Visit: Payer: Self-pay | Admitting: Internal Medicine

## 2014-01-07 NOTE — Telephone Encounter (Signed)
Has enough norvasc until 05/2014

## 2014-01-08 ENCOUNTER — Other Ambulatory Visit: Payer: Self-pay | Admitting: *Deleted

## 2014-01-08 DIAGNOSIS — E1140 Type 2 diabetes mellitus with diabetic neuropathy, unspecified: Secondary | ICD-10-CM

## 2014-01-08 MED ORDER — INSULIN NPH ISOPHANE & REGULAR (70-30) 100 UNIT/ML ~~LOC~~ SUSP: SUBCUTANEOUS | Status: DC

## 2014-01-08 MED ORDER — GLUCOSE BLOOD VI STRP: ORAL_STRIP | Status: DC

## 2014-01-08 NOTE — Telephone Encounter (Signed)
Insurance will only cover for 6 months.

## 2014-01-09 ENCOUNTER — Other Ambulatory Visit: Payer: Self-pay | Admitting: *Deleted

## 2014-01-09 NOTE — Telephone Encounter (Signed)
The other you sent needed diag code and insulin dependent, cannot call and change must send new one, thanks h.

## 2014-01-10 NOTE — Telephone Encounter (Signed)
Year supply written 01/07/14

## 2014-01-18 ENCOUNTER — Other Ambulatory Visit: Payer: Self-pay | Admitting: *Deleted

## 2014-01-21 NOTE — Telephone Encounter (Signed)
Year supply sent 01/07/14.

## 2014-01-29 ENCOUNTER — Ambulatory Visit (INDEPENDENT_AMBULATORY_CARE_PROVIDER_SITE_OTHER): Payer: Medicare Other | Admitting: Internal Medicine

## 2014-01-29 VITALS — BP 125/72 | HR 74 | Temp 98.2°F | Ht 61.0 in | Wt 224.6 lb

## 2014-01-29 DIAGNOSIS — I872 Venous insufficiency (chronic) (peripheral): Secondary | ICD-10-CM | POA: Diagnosis not present

## 2014-01-29 DIAGNOSIS — D509 Iron deficiency anemia, unspecified: Secondary | ICD-10-CM | POA: Diagnosis not present

## 2014-01-29 DIAGNOSIS — G4733 Obstructive sleep apnea (adult) (pediatric): Secondary | ICD-10-CM | POA: Diagnosis not present

## 2014-01-29 DIAGNOSIS — E1142 Type 2 diabetes mellitus with diabetic polyneuropathy: Secondary | ICD-10-CM

## 2014-01-29 DIAGNOSIS — G5600 Carpal tunnel syndrome, unspecified upper limb: Secondary | ICD-10-CM

## 2014-01-29 DIAGNOSIS — E1149 Type 2 diabetes mellitus with other diabetic neurological complication: Secondary | ICD-10-CM

## 2014-01-29 DIAGNOSIS — E1140 Type 2 diabetes mellitus with diabetic neuropathy, unspecified: Secondary | ICD-10-CM

## 2014-01-29 DIAGNOSIS — E785 Hyperlipidemia, unspecified: Secondary | ICD-10-CM

## 2014-01-29 DIAGNOSIS — I10 Essential (primary) hypertension: Secondary | ICD-10-CM | POA: Diagnosis not present

## 2014-01-29 DIAGNOSIS — G8929 Other chronic pain: Secondary | ICD-10-CM

## 2014-01-29 DIAGNOSIS — J309 Allergic rhinitis, unspecified: Secondary | ICD-10-CM | POA: Diagnosis not present

## 2014-01-29 DIAGNOSIS — R5381 Other malaise: Secondary | ICD-10-CM | POA: Diagnosis not present

## 2014-01-29 DIAGNOSIS — I2510 Atherosclerotic heart disease of native coronary artery without angina pectoris: Secondary | ICD-10-CM

## 2014-01-29 DIAGNOSIS — Z0000 Encounter for general adult medical examination without abnormal findings: Secondary | ICD-10-CM | POA: Diagnosis not present

## 2014-01-29 DIAGNOSIS — J302 Other seasonal allergic rhinitis: Secondary | ICD-10-CM

## 2014-01-29 DIAGNOSIS — E669 Obesity, unspecified: Secondary | ICD-10-CM | POA: Diagnosis not present

## 2014-01-29 MED ORDER — MOMETASONE FUROATE 50 MCG/ACT NA SUSP: 2.0000 | Freq: Every day | NASAL | Status: DC

## 2014-01-29 MED ORDER — SENNA 8.6 MG PO TABS: 2.0000 | ORAL_TABLET | Freq: Every day | ORAL | Status: DC

## 2014-01-29 NOTE — Assessment & Plan Note (Signed)
Asymptomatic. Just saw cards. On ASA, BB, statin.

## 2014-01-29 NOTE — Assessment & Plan Note (Signed)
We reviewed her iron levels. Cont [id number]

## 2014-01-29 NOTE — Assessment & Plan Note (Signed)
Wt Readings from Last 3 Encounters:  01/29/14 224 lb 9.6 oz (101.878 kg)  01/03/14 222 lb 12.8 oz (101.061 kg)  12/17/13 222 lb 6.4 oz (100.88 kg)   She is frustrated bc she cannot lose wt. States eating healthy with lots of Veg and is walking with her cane.  1. Refer to [name] - health coach and nutrition 2. Sleep study - untreated OSA can contribute 3. Cont healthy diet and exercise

## 2014-01-29 NOTE — Assessment & Plan Note (Addendum)
She forgot her meter today. Denies lows. Some highs. 1000 bid metformin and 28 units BID 70/30. We discussed new and conflicting K8M goals. Wants to get it down a bit. I am overall OK with 7.6 and is trending down so no med change. Wants Rx for DM shoes. 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 She got DM shoes previously bc the prior form only required peripheral neuropathy.   Lab Results  Component Value Date   HGBA1C 7.6 01/03/2014   HGBA1C 7.7 08/10/2013   HGBA1C 6.5 03/06/2013     Assessment: Diabetes control: good control (HgbA1C at goal) Progress toward A1C goal:  at goal Comments: See above  Plan: Medications:  continue current medications Home glucose monitoring: Frequency: 3 times a day Timing: before meals Instruction/counseling given: discussed the need for weight loss Educational resources provided:   Self management tools provided:   Other plans: See above

## 2014-01-29 NOTE — Progress Notes (Signed)
   Subjective:    Patient ID: [name], female    DOB: December 22, 1950, 63 y.o.   MRN: [id number]  Please see the A&P for the status of the pt's chronic medical problems.   Constipation Associated symptoms include back pain.  Diabetes Hypoglycemia symptoms include headaches. Associated symptoms include fatigue. Pertinent negatives for diabetes include no chest pain.      Review of Systems  Constitutional: Positive for fatigue. Negative for unexpected weight change.  HENT: Positive for congestion and rhinorrhea.   Eyes: Positive for itching.  Respiratory: Positive for cough.   Cardiovascular: Positive for leg swelling. Negative for chest pain.  Gastrointestinal: Positive for constipation and abdominal distention.  Musculoskeletal: Positive for back pain and gait problem.  Neurological: Positive for headaches.  Psychiatric/Behavioral: Positive for sleep disturbance.       Objective:   Physical Exam  Constitutional: She is oriented to person, place, and time. She appears well-developed and well-nourished. No distress.  HENT:  Head: Normocephalic and atraumatic.  Right Ear: External ear normal.  Left Ear: External ear normal.  Nose: Nose normal.  Neurological: She is alert and oriented to person, place, and time.  Skin: Skin is warm and dry. She is not diaphoretic.  Psychiatric: She has a normal mood and affect. Her behavior is normal. Judgment and thought content normal.          Assessment & Plan:

## 2014-01-29 NOTE — Assessment & Plan Note (Signed)
Cont gabapentin

## 2014-01-29 NOTE — Assessment & Plan Note (Signed)
Only uses CPAP 2-3 times per week. States too drying even with humidity. Can be caused and cause obesity. Sleep study.

## 2014-01-29 NOTE — Assessment & Plan Note (Addendum)
Foot exam done Cont to refused Zostavax despite education.   Constipation and bloating - has had colon recently. Using miralax and metamucil and colace (unknown dose but two pills). Hard stools, infreq, straining. On meds that can cause - FESO4, clonidine - but cannot stop. Plan : 1. Start colace QD - wasn't taking it QD 2. Stop metamucil - may be constipating. 3. Add Senna if no better.

## 2014-01-29 NOTE — Assessment & Plan Note (Signed)
LDL at 103 on max lipitor. Check nest visit.

## 2014-01-29 NOTE — Patient Instructions (Signed)
General Instructions:  1. Referral to [facility] coach 2. Sent in script for nose spray allergies 3. For constipation  Take colace every day  Stop metamucil  Try senna if above doesn't help  Treatment Goals:  Goals (1 Years of Data) as of 01/29/14         As of Today 01/03/14 01/03/14 01/03/14 01/03/14     Blood Pressure    . Blood Pressure < 140/90  125/72 142/76 150/86 145/87 155/86     Lifestyle    . Prevent Falls           Result Component    . HEMOGLOBIN A1C < 7.0   7.6       . LDL CALC < 100            Progress Toward Treatment Goals:  Treatment Goal 01/29/2014  Hemoglobin A1C at goal  Blood pressure at goal  Prevent falls -    Self Care Goals & Plans:  Self Care Goal 01/29/2014  Manage my medications take my medicines as prescribed; bring my medications to every visit; refill my medications on time  Monitor my health keep track of my blood glucose; bring my glucose meter and log to each visit; check my feet daily  Eat healthy foods drink diet soda or water instead of juice or soda; eat foods that are low in salt; eat baked foods instead of fried foods; eat fruit for snacks and desserts  Be physically active find an activity I enjoy  Prevent falls -  Meeting treatment goals -    Home Blood Glucose Monitoring 01/29/2014  Check my blood sugar 3 times a day  When to check my blood sugar before meals     Care Management & Community Referrals:  Referral 01/29/2014  Referrals made for care management support none needed  Referrals made to community resources -

## 2014-01-29 NOTE — Assessment & Plan Note (Signed)
BP great today. Brings log from home - 114 - 164 / 56 - 89. Cont current meds of Toprol XL, Cozaar 50, clonidine 0.2 BID, and Norvasc 10.   BP Readings from Last 3 Encounters:  01/29/14 125/72  01/03/14 142/76  12/17/13 138/77    Lab Results  Component Value Date   NA 138 05/08/2013   K 4.2 05/08/2013   CREATININE 0.86 05/08/2013    Assessment: Blood pressure control: controlled Progress toward BP goal:  at goal Comments: See above   Plan: Medications:  continue current medications Educational resources provided: handout Self management tools provided:   Other plans: See above

## 2014-01-29 NOTE — Assessment & Plan Note (Signed)
Had steroid injection and got great relief for 3 weeks then pain returned. She doesn't think she will return. Uses tramadol PRN along with sch gabapentin.  Carpel Tunnel - Medicaid / medicare doesn't pay for splints so she didn't get. Has one old one and is using it at night. Hands still "go dead". Stopped crochet to help pain.

## 2014-01-29 NOTE — Assessment & Plan Note (Signed)
Not well controlled on her PO claritin. Add steroid nasal spray - instructed weill take 2 weeks for relief. To start two weeks or so from allergy season in future if possible to time right.

## 2014-02-07 ENCOUNTER — Ambulatory Visit (INDEPENDENT_AMBULATORY_CARE_PROVIDER_SITE_OTHER): Payer: Medicare Other | Admitting: Dietician

## 2014-02-07 ENCOUNTER — Encounter: Payer: Self-pay | Admitting: Dietician

## 2014-02-07 VITALS — Wt 225.1 lb

## 2014-02-07 DIAGNOSIS — E669 Obesity, unspecified: Secondary | ICD-10-CM

## 2014-02-07 NOTE — Patient Instructions (Signed)
Your goal for the next two weeks to lower your calories is to add half and avocado to your smoothie and less or no coconut cream.  Watch out for the seed and nuts- they are high calorie- about 1/4 cup total of nuts and seeds each day  Please make an appointment for 2 weeks.   Please call me right away if you have a low blood sugar (less than 80) .  [name]  [phone]

## 2014-02-07 NOTE — Progress Notes (Signed)
Health Coaching  Start time: 950 AM  End time: 1035 AM  Patient Identified Concern:  Patient is frustrated that she has changed her diet and is walking and doing leg exercises daily and weight is not decreasing Stage of Change Patient Is In:  Patient is in the preparation/action stage of change regarding weight loss. Patient Reported Barriers:  She does not mention any barriers, however, realized by our discussion today that maybe she is consuming too large of portions of foods that are "healthy" such as nuts, seeds, avocados, coconut cream Patient Reported Perceived Benefits:  She would like to feel less "bloated", be able to walk up a hill without as much struggle and help her daughter lose weight.  Patient Reports Self-Efficacy:   Not assessed today. Patient likely is moderately self confident due to lack of previous success Behavior Change Supports:  Daughter, her doctors and friends are supportive of her changes.  Goals:  Limit high calorie food portions - half an avocado in her smoothies, 1/4 cup nuts a day,  Patient Education:  Discussed balance of calories in vs calories out needed for desired weight loss  Barriers to weight loss: Lack of knowledge about how to go about weight loss,  patient is on to medications that cause weight gain- gabapentin and insulin. She reports blood sugars between 69 and 190 before meals recently( did not bring meter today) Balancing decreased calories to encourage weight losss with insulin and blood sugars can be challenging.   Coordination of care: Discuss with physician what patient should do if/when she experiences low blood sugar- decreasing her insulin dose would be preferred to assist with patient goal of weight loss.

## 2014-02-08 ENCOUNTER — Ambulatory Visit (HOSPITAL_BASED_OUTPATIENT_CLINIC_OR_DEPARTMENT_OTHER): Payer: Medicare Other | Attending: Internal Medicine

## 2014-02-08 VITALS — Ht 61.0 in | Wt 225.0 lb

## 2014-02-08 DIAGNOSIS — G4733 Obstructive sleep apnea (adult) (pediatric): Secondary | ICD-10-CM

## 2014-02-08 DIAGNOSIS — G4730 Sleep apnea, unspecified: Principal | ICD-10-CM

## 2014-02-08 DIAGNOSIS — G4710 Hypersomnia, unspecified: Secondary | ICD-10-CM | POA: Insufficient documentation

## 2014-02-09 DIAGNOSIS — G4730 Sleep apnea, unspecified: Secondary | ICD-10-CM

## 2014-02-09 DIAGNOSIS — G4733 Obstructive sleep apnea (adult) (pediatric): Secondary | ICD-10-CM | POA: Diagnosis not present

## 2014-02-09 DIAGNOSIS — G4710 Hypersomnia, unspecified: Secondary | ICD-10-CM | POA: Diagnosis not present

## 2014-02-09 NOTE — Sleep Study (Signed)
   NAME: [name] DATE OF BIRTH:  11/17/1950 MEDICAL RECORD NUMBER [id number]  LOCATION: [facility]  PHYSICIAN: Torian Quintero D Carolyna Yerian  DATE OF STUDY: 02/08/2014  SLEEP STUDY TYPE: Nocturnal Polysomnogram               REFERRING PHYSICIAN: Bartholomew Crews, MD  INDICATION FOR STUDY: Hypersomnia with sleep apnea  EPWORTH SLEEPINESS SCORE:   5/24 HEIGHT: 5\' 1"  (154.9 cm)  WEIGHT: 225 lb (102.059 kg)    Body mass index is 42.54 kg/(m^2).  NECK SIZE: 15 in.  MEDICATIONS: Charted for review  SLEEP ARCHITECTURE: Total sleep time 310 minutes with sleep efficiency 79.6%. Stage I was 5%, stage II 94.4%, stage III absent, REM 0.6% of total sleep time. Sleep latency 32.5 minutes, REM latency 271 minutes, awake after sleep onset 48 minutes, arousal index 5.8. Bedtime medication: Clonidine  RESPIRATORY DATA: Apnea hypopnea index (AHI) 0.4 per hour. A total of 2 events scored, including one obstructive apnea and 1 hypopneas. REM AHI 0. There were not enough events to qualify for split protocol CPAP titration.  OXYGEN DATA: Moderately loud snoring with oxygen desaturation to a nadir of 90% and mean oxygen saturation through the study of 93.1% on room air.  CARDIAC DATA: Sinus rhythm with PACs and PVCs  MOVEMENT/PARASOMNIA: A total of 19 limb jerks counted of which only 3 were associated with arousals or awakenings for periodic limb movement with arousal index of 0.6 per hour. No bathroom trips  IMPRESSION/ RECOMMENDATION:   1) Rare respiratory events with sleep disturbance, within normal limits. AHI 0.4 per hour (the normal range for adults as an AHI from 0-5 events per hour). Moderately loud snoring with oxygen desaturation to a nadir of 90% and mean oxygen saturation through the study of 93.1% on room air.  2) A previous polysomnogram on 02/16/2007 had recorded AHI 35.6 per hour and titrated CPAP to 16. She weighed 50 pounds more for that study, at 275 pounds.  Signed  Baird Lyons M.D. [place], [employer] [facility] of Sleep Medicine  ELECTRONICALLY SIGNED ON:  02/09/2014, 2:34 PM [place] PH: [phone]   FX: [phone] [place]

## 2014-02-11 ENCOUNTER — Encounter: Payer: Self-pay | Admitting: Internal Medicine

## 2014-02-11 DIAGNOSIS — G4733 Obstructive sleep apnea (adult) (pediatric): Secondary | ICD-10-CM

## 2014-02-14 ENCOUNTER — Telehealth: Payer: Self-pay | Admitting: Dietician

## 2014-02-14 NOTE — Telephone Encounter (Signed)
Called patient to discuss interest in starting a GLP-1 Mimetic to assist her with her weight loss per Dr. Zenovia Jarred request. discussed pros and cons of new medication.  Patient wants to stay with what she is on, has started walking more and hopes this will help her only two problems- weight and constipation.  Blood sugars have been 92-165, asked he to call us for insulin decrease if walking lowers her blood sugar below 80 mg/dl.

## 2014-02-21 ENCOUNTER — Ambulatory Visit (INDEPENDENT_AMBULATORY_CARE_PROVIDER_SITE_OTHER): Payer: Medicare Other | Admitting: Dietician

## 2014-02-21 ENCOUNTER — Encounter: Payer: Self-pay | Admitting: Dietician

## 2014-02-21 VITALS — Wt 224.4 lb

## 2014-02-21 DIAGNOSIS — E1140 Type 2 diabetes mellitus with diabetic neuropathy, unspecified: Secondary | ICD-10-CM

## 2014-02-21 DIAGNOSIS — E1149 Type 2 diabetes mellitus with other diabetic neurological complication: Secondary | ICD-10-CM

## 2014-02-21 NOTE — Patient Instructions (Signed)
Great Job cutting back on nuts, avacados and fluids! You lost weight an controlled your blood sugars very week!!!.    To help you continue to decrease your weight: 1- Cut back on fluid 2- Mychart dr./ Lynnae January about diabetes medicines- insulin and metformin 3- Your plan is to walk twice a day on flat surfaces like the mall, the circle at your complex to see if that helps your angina.  Please make a follow up in 3-4 weeks.

## 2014-02-21 NOTE — Progress Notes (Signed)
Health Coaching  Start time: 945 AM  End time: 1026 AM Patient complains of swelling today. ( 2-3+pitting edema on shins noted today) . She cut back on nuts, avocados and also smoothies to decrease her fluid intake and is now eating the fruit and veggies without the fluid. Her weight is decreased ~ 1/2 pound over the last 3 weeks. Meter download shows well controlled blood sugar between 90 and 200s.  Patient Identified Concern:  Patient continue to desire weight loss. Goal is ultimately 210# but 220# in the short term.  Stage of Change Patient Is In:  Patient is in the preparation/action stage of change regarding weight loss. Patient Reported Barriers:  She says that her angina is preventing her from walking as much as she'd like. Hills tend to bother her the most.  Patient Reported Perceived Benefits:  She would like to feel less "bloated", be able to walk up a hill without as much struggle and help her daughter lose weight.  Patient Reports Self-Efficacy:   Improving. Patient  is moderately self confident due to lack of previous success Behavior Change Supports:  Daughter, her doctors and friends are supportive of her changes.  Goals:  Walk on flat surfaces only to prevent angina, patient to discuss  a trial of decreased insulin (and maybe maximizing metformin) to see if that helps her weight,  Patient Education:  Diabetes Medications actions.   Barriers to weight loss: Lack of knowledge about weight loss,  patient is on to medications that cause weight gain- insulin. Balancing decreased calories and  medication to encourage weight losss with insulin and blood sugars can be challenging.   Coordination of care: Give copy of meter download to physician.

## 2014-03-10 ENCOUNTER — Encounter (HOSPITAL_BASED_OUTPATIENT_CLINIC_OR_DEPARTMENT_OTHER): Payer: Medicare Other

## 2014-03-11 ENCOUNTER — Other Ambulatory Visit: Payer: Self-pay | Admitting: *Deleted

## 2014-03-11 NOTE — Telephone Encounter (Signed)
Fax from pharmacy - requesting dx code - done.

## 2014-03-12 MED ORDER — ACCU-CHEK FASTCLIX LANCETS MISC: Status: DC

## 2014-03-14 ENCOUNTER — Encounter: Payer: Medicare Other | Admitting: Dietician

## 2014-03-18 ENCOUNTER — Ambulatory Visit (INDEPENDENT_AMBULATORY_CARE_PROVIDER_SITE_OTHER): Payer: Medicare Other | Admitting: Dietician

## 2014-03-18 VITALS — Wt 224.8 lb

## 2014-03-18 DIAGNOSIS — E669 Obesity, unspecified: Secondary | ICD-10-CM

## 2014-03-18 NOTE — Patient Instructions (Addendum)
Your weight is stable and that is great!  Your protein intake is great! Keep it up, it may take 4-6 weeks to see results.  Your fluid is just a bump in the road, nothing you cannot get around!!!  You said you were going to get in touch with Dr. Lynnae January about the fluid build up.  Please make a follow up in 3 weeks.

## 2014-03-18 NOTE — Progress Notes (Signed)
Health Coaching  Start time: 8:35 AM  End time: 9:20AM Patient complains of swelling today.  She continues to eat fruits and vegetables.  Drinks 2 large protein shakes (grape protein powder 1/2 scoop).  Total daily protein intake ~94g/day.Daily fluid intake is roughly 8 cups.  Meter download shows well controlled blood sugar. Weight today was 224.8#. Patient Identified Concern:  Patient continue to desire weight loss. Goal is ultimately 210# but 220# in the short term.  Stage of Change Patient Is In:  Patient is in the preparation/action stage of change regarding weight loss. Patient Reported Barriers:  She states that her exercise has decreased because of pain and swelling Patient Reported Perceived Benefits:  She would like to feel less "bloated" and decrease swelling.  Patient Reports Self-Efficacy:   Improving. Patient  is moderately self confident due to lack of previous success Behavior Change Supports:  Daughter, her doctors and friends are supportive of her changes.  Goals: Discuss swelling issue with doctor and continue with current protein intake Patient Education:  Protein content of foods. Barriers to weight loss: Lack of knowledge about weight loss,  patient is on to medications that cause weight gain- insulin. Balancing decreased calories and  medication to encourage weight loss with insulin and blood sugars can be challenging.  Swelling in feet and legs are preventing exercise. Coordination of care: contact doctor about metformin dose. Discrepancy.

## 2014-03-25 ENCOUNTER — Other Ambulatory Visit: Payer: Self-pay | Admitting: *Deleted

## 2014-03-25 MED ORDER — ATORVASTATIN CALCIUM 80 MG PO TABS: ORAL_TABLET | ORAL | Status: DC

## 2014-03-26 ENCOUNTER — Ambulatory Visit (INDEPENDENT_AMBULATORY_CARE_PROVIDER_SITE_OTHER): Payer: Medicare Other | Admitting: Internal Medicine

## 2014-03-26 ENCOUNTER — Encounter: Payer: Self-pay | Admitting: Internal Medicine

## 2014-03-26 VITALS — BP 136/68 | HR 64 | Temp 97.3°F | Ht 61.0 in | Wt 222.9 lb

## 2014-03-26 DIAGNOSIS — E1149 Type 2 diabetes mellitus with other diabetic neurological complication: Secondary | ICD-10-CM

## 2014-03-26 DIAGNOSIS — E669 Obesity, unspecified: Secondary | ICD-10-CM

## 2014-03-26 DIAGNOSIS — E1142 Type 2 diabetes mellitus with diabetic polyneuropathy: Secondary | ICD-10-CM

## 2014-03-26 DIAGNOSIS — Z794 Long term (current) use of insulin: Secondary | ICD-10-CM

## 2014-03-26 DIAGNOSIS — E785 Hyperlipidemia, unspecified: Secondary | ICD-10-CM

## 2014-03-26 DIAGNOSIS — I2510 Atherosclerotic heart disease of native coronary artery without angina pectoris: Secondary | ICD-10-CM

## 2014-03-26 DIAGNOSIS — I10 Essential (primary) hypertension: Secondary | ICD-10-CM | POA: Diagnosis not present

## 2014-03-26 DIAGNOSIS — I872 Venous insufficiency (chronic) (peripheral): Secondary | ICD-10-CM | POA: Diagnosis not present

## 2014-03-26 DIAGNOSIS — R5383 Other fatigue: Secondary | ICD-10-CM | POA: Diagnosis not present

## 2014-03-26 DIAGNOSIS — J309 Allergic rhinitis, unspecified: Secondary | ICD-10-CM | POA: Diagnosis not present

## 2014-03-26 DIAGNOSIS — I25119 Atherosclerotic heart disease of native coronary artery with unspecified angina pectoris: Secondary | ICD-10-CM

## 2014-03-26 DIAGNOSIS — G4733 Obstructive sleep apnea (adult) (pediatric): Secondary | ICD-10-CM | POA: Diagnosis not present

## 2014-03-26 DIAGNOSIS — Z0000 Encounter for general adult medical examination without abnormal findings: Secondary | ICD-10-CM

## 2014-03-26 DIAGNOSIS — G8929 Other chronic pain: Secondary | ICD-10-CM | POA: Diagnosis not present

## 2014-03-26 DIAGNOSIS — E1140 Type 2 diabetes mellitus with diabetic neuropathy, unspecified: Secondary | ICD-10-CM

## 2014-03-26 DIAGNOSIS — J302 Other seasonal allergic rhinitis: Secondary | ICD-10-CM

## 2014-03-26 DIAGNOSIS — R5381 Other malaise: Secondary | ICD-10-CM | POA: Diagnosis not present

## 2014-03-26 LAB — LIPID PANEL
Cholesterol: 205 mg/dL — ABNORMAL HIGH (ref 0–200)
HDL: 52 mg/dL (ref >39)
LDL Cholesterol: 137 mg/dL — ABNORMAL HIGH (ref 0–99)
TRIGLYCERIDES: 80 mg/dL (ref <150)
Total CHOL/HDL Ratio: 3.9 Ratio
VLDL: 16 mg/dL (ref 0–40)

## 2014-03-26 LAB — GLUCOSE, CAPILLARY: Glucose-Capillary: 122 mg/dL — ABNORMAL HIGH (ref 70–99)

## 2014-03-26 LAB — POCT GLYCOSYLATED HEMOGLOBIN (HGB A1C): Hemoglobin A1C: 7.3

## 2014-03-26 MED ORDER — FUROSEMIDE 20 MG PO TABS: 20.0000 mg | ORAL_TABLET | Freq: Every day | ORAL | Status: DC | PRN

## 2014-03-26 NOTE — Assessment & Plan Note (Signed)
BP is great. Discussed that lasix might decrease her BP but is PRN now. If needs it QD, would sch it BID for BP control. Norvasc might need to be D/C'd in future but for now since so good BP control will leave as is. If swelling no better, would stop it and follow.   BP Readings from Last 3 Encounters:  03/26/14 136/68  01/29/14 125/72  01/03/14 142/76

## 2014-03-26 NOTE — Assessment & Plan Note (Signed)
Pt states she did not sleep at all during the most recent sleep study bc the glue they used made her itch. Her sleep study results reports sleep time of 310 minutes. The sleep study did not show OSA. Pt cont to use her CPAP.

## 2014-03-26 NOTE — Assessment & Plan Note (Signed)
Taking Lipitor 80 QD. Tolerating it well. LDL 103 most recently so will recheck today.

## 2014-03-26 NOTE — Assessment & Plan Note (Signed)
Wt Readings from Last 3 Encounters:  03/26/14 222 lb 14.4 oz (101.107 kg)  03/18/14 224 lb 12.8 oz (101.969 kg)  02/21/14 224 lb 6.4 oz (101.787 kg)    She is understandably frustrated bc her weight remains stuck at 222 - 224 range. Working with [name].

## 2014-03-26 NOTE — Patient Instructions (Signed)
1. Take the lasix once daily as needed when the swelling is really bad. If it bothers your heart stop it. If it helps let me know. If you take it more than 2 or 3 times weekly, let me know as I will need to check your labs. 2. See me in 3 months 3. I will send your labs to you.

## 2014-03-26 NOTE — Assessment & Plan Note (Signed)
This is a chronic issue and has been W/U extensively - see overview and past notes. Has tried lasix in past but caused muscle spasms around heart. Trial of lasix 20 (lower dose than before) PRN once daily. Elevate legs. Tried to get Rx stocking before but medicaid will not cover. If she requires the lasix more than 3 times weekly, she is to call me so we can get BMP.

## 2014-03-26 NOTE — Assessment & Plan Note (Signed)
A1C cont to trend down slowly 7.7 - 7.6 - 7.3 today. Her regimen is metformin 1000 BID and 70/30 28 units BID. No lows. No meter today. UTD on all items.

## 2014-03-26 NOTE — Assessment & Plan Note (Signed)
On ASA, BB, statin, nitro patch. No CP. When on diuretic in past, it caused muscle spasm around heart. Advised if it causes that this time, she would need to stop the lasix.

## 2014-03-26 NOTE — Progress Notes (Signed)
   Subjective:    Patient ID: [name], female    DOB: [birth year], 63 y.o.   MRN: [id number]  Diabetes Pertinent negatives for diabetes include no chest pain.    Please see the A&P for the status of the pt's chronic medical problems.  Review of Systems  Constitutional: Negative for activity change, appetite change and unexpected weight change.  HENT: Negative for rhinorrhea and sinus pressure.   Eyes: Negative for itching.  Cardiovascular: Positive for leg swelling. Negative for chest pain.  Gastrointestinal: Negative for constipation.  Musculoskeletal: Positive for myalgias.       Objective:   Physical Exam  Constitutional: She is oriented to person, place, and time. She appears well-developed and well-nourished. No distress.  HENT:  Head: Normocephalic and atraumatic.  Right Ear: External ear normal.  Left Ear: External ear normal.  Nose: Nose normal.  Eyes: Conjunctivae and EOM are normal.  Musculoskeletal: Normal range of motion. She exhibits edema and tenderness.  +1 edema B R>L to knees  Neurological: She is alert and oriented to person, place, and time.  Skin: Skin is warm and dry. No rash noted. She is not diaphoretic. No erythema. No pallor.  Psychiatric: She has a normal mood and affect. Her behavior is normal. Judgment and thought content normal.          Assessment & Plan:

## 2014-03-26 NOTE — Assessment & Plan Note (Signed)
The nasal steroid spray helped her sxs a lot.

## 2014-03-27 ENCOUNTER — Encounter: Payer: Self-pay | Admitting: Internal Medicine

## 2014-04-08 ENCOUNTER — Encounter: Payer: Self-pay | Admitting: Dietician

## 2014-04-08 ENCOUNTER — Encounter: Payer: Self-pay | Admitting: Internal Medicine

## 2014-04-08 ENCOUNTER — Ambulatory Visit (INDEPENDENT_AMBULATORY_CARE_PROVIDER_SITE_OTHER): Payer: Medicare Other | Admitting: Dietician

## 2014-04-08 ENCOUNTER — Other Ambulatory Visit: Payer: Self-pay | Admitting: Internal Medicine

## 2014-04-08 VITALS — Wt 223.3 lb

## 2014-04-08 DIAGNOSIS — E1142 Type 2 diabetes mellitus with diabetic polyneuropathy: Secondary | ICD-10-CM

## 2014-04-08 DIAGNOSIS — E669 Obesity, unspecified: Secondary | ICD-10-CM

## 2014-04-08 DIAGNOSIS — E1140 Type 2 diabetes mellitus with diabetic neuropathy, unspecified: Secondary | ICD-10-CM

## 2014-04-08 DIAGNOSIS — E1149 Type 2 diabetes mellitus with other diabetic neurological complication: Secondary | ICD-10-CM

## 2014-04-08 NOTE — Progress Notes (Signed)
Health Coaching  Start time: 8:35 AM  End time: 9:00AM Patient reports swelling better after taking lasix twie last week.  She has cut down on fruits and vegetables.  Drinks 2 large protein shakes (grape protein powder 1/2 scoop each for 130 calories and 16 g protein from powder).    Meter download shows well controlled blood sugar with average 148mg /dl. Weight today was 223.3#. [name] is not abel to walk due to back pain. Does chair exercise daily Patient Identified Concern:  Patient continues to desire weight loss. Goal is ultimately 210# but 220# in the short term.  Stage of Change Patient Is In:  Patient is in the preparation/action stage of change regarding weight loss. Patient Reported Barriers:  She states that her exercise has decreased because of pain and swelling Patient Reported Perceived Benefits:  She would like to feel less "bloated" and decrease swelling.  Patient Reports Self-Efficacy:   Improving. Patient  is moderately self confident due to lack of previous success Behavior Change Supports:  Daughter, her doctors and friends are supportive of her changes.  Goals: Record food intake with calorie content, consider requesting temporary trial of decreased insulin. Patient Education:  Caloric content of foods. Barriers to weight loss: Lack of knowledge about caloric content of foods, suspect lack of awareness to calories consumed.  patient is on to medications that cause weight gain. Balancing decreased calories and  medication to encourage weight loss with insulin and blood sugars can be challenging.  Back pain preventing adequate acitiviy. Coordination of care: Meter download to Dr. Zenovia Jarred box.

## 2014-04-08 NOTE — Patient Instructions (Signed)
Please make an appointment for 2 weeks.   Your goal: keep track of calories like it was 18 dollars for each day( 1800 calories) for a few days.  Great job controlling your weight and blood sugars!

## 2014-04-12 ENCOUNTER — Telehealth: Payer: Self-pay | Admitting: Dietician

## 2014-04-12 NOTE — Telephone Encounter (Signed)
Called patient to discuss lowering of insulin: encouraged her to use diet and activity to lower her blood sugars if they increase. She reports her fasting have been 150 and 165 and last night before dinner it was 138. Her sciatica is bothering her so she is not abel to walk-tried yesterday but was in too much pain. Encouraged stretching and bicyles while laying on the ground until pain decreased.  She agreed to have stretches and easy exercises in chair or floor mailed to her and to call if blood sugars >200 mg/dl.   She intends to make an appointment with Dr. Zigmund Daniel this month.

## 2014-04-22 ENCOUNTER — Encounter: Payer: Self-pay | Admitting: Dietician

## 2014-04-22 ENCOUNTER — Ambulatory Visit: Payer: Medicare Other | Admitting: Dietician

## 2014-04-22 VITALS — Wt 222.2 lb

## 2014-04-22 DIAGNOSIS — E669 Obesity, unspecified: Secondary | ICD-10-CM

## 2014-04-22 NOTE — Patient Instructions (Signed)
You have lost 1.2 pounds in 2 weeks  You said when you get to feeling depressed you will do yoga or walk.  See you  In two weeks!  Remember FEELINGS are not FACTS.  feelings come an go, facts stay.Marland KitchenMarland KitchenMarland Kitchen

## 2014-04-22 NOTE — Progress Notes (Signed)
Health Coaching  Start time: 9:30AM  End time: 10:15AM Patient reports swelling better, able to be a little more active.  Drinks 2 8 0z meal replacements/day (grape protein powder 1/2 scoop each for 130 calories and 16 g protein from powder).    Meter download shows well controlled blood sugar with average 151mg /dl, fasting blood sugars 130-127-103-163-114-134-118-126-170 since insulin decrease. Weight today was 222.2#.  Patient Identified Concern:  Patient unhappy with slow weight loss. Goal is ultimately 210# but 220# in the short term.  Stage of Change Patient Is In:  Patient is in the preparation/action stage of change regarding weight loss. Patient Reported Barriers:  She states that her exercise has decreased because of pain and she is back to taking pain pills to walk twice a week. Patient Reported Perceived Benefits:  She would like to feel less "bloated" and decrease swelling.  Patient Reports Self-Efficacy: Less confident today because of slowness of weight loss. Behavior Change Supports:  Daughter, who is here today. Her doctors and friends are supportive of her changes.  Goals: Record food intake with calorie content. Patient Education:  Feelings can be barriers. Barriers to weight loss: Lack of knowledge about caloric content of foods, suspect lack of awareness to calories consumed.  patient is on to medications that cause weight gain. Balancing decreased calories and  medication to encourage weight loss with insulin and blood sugars can be challenging.  Back pain preventing adequate acitiviy. Coordination of care: Meter download to Dr. Zenovia Jarred box.

## 2014-04-23 ENCOUNTER — Encounter: Payer: Self-pay | Admitting: Dietician

## 2014-04-25 ENCOUNTER — Ambulatory Visit (INDEPENDENT_AMBULATORY_CARE_PROVIDER_SITE_OTHER): Payer: Medicare Other | Admitting: Ophthalmology

## 2014-04-25 DIAGNOSIS — E1139 Type 2 diabetes mellitus with other diabetic ophthalmic complication: Secondary | ICD-10-CM | POA: Diagnosis not present

## 2014-04-25 DIAGNOSIS — E11319 Type 2 diabetes mellitus with unspecified diabetic retinopathy without macular edema: Secondary | ICD-10-CM

## 2014-04-25 DIAGNOSIS — I10 Essential (primary) hypertension: Secondary | ICD-10-CM | POA: Diagnosis not present

## 2014-04-25 DIAGNOSIS — H35039 Hypertensive retinopathy, unspecified eye: Secondary | ICD-10-CM | POA: Diagnosis not present

## 2014-04-25 DIAGNOSIS — E1165 Type 2 diabetes mellitus with hyperglycemia: Secondary | ICD-10-CM

## 2014-04-25 DIAGNOSIS — H2510 Age-related nuclear cataract, unspecified eye: Secondary | ICD-10-CM

## 2014-04-25 DIAGNOSIS — H43819 Vitreous degeneration, unspecified eye: Secondary | ICD-10-CM

## 2014-04-25 LAB — HM DIABETES EYE EXAM

## 2014-04-29 ENCOUNTER — Other Ambulatory Visit: Payer: Self-pay | Admitting: Internal Medicine

## 2014-04-29 NOTE — Telephone Encounter (Signed)
Has refills at pharmacy

## 2014-05-06 ENCOUNTER — Other Ambulatory Visit: Payer: Self-pay | Admitting: Internal Medicine

## 2014-05-06 ENCOUNTER — Encounter: Payer: Self-pay | Admitting: Dietician

## 2014-05-06 ENCOUNTER — Ambulatory Visit: Payer: Medicare Other | Admitting: Dietician

## 2014-05-06 VITALS — Wt 220.2 lb

## 2014-05-06 DIAGNOSIS — E669 Obesity, unspecified: Secondary | ICD-10-CM

## 2014-05-06 MED ORDER — ACCU-CHEK FASTCLIX LANCETS MISC: Status: DC

## 2014-05-06 NOTE — Patient Instructions (Signed)
Please make a follow up in 2 weeks.

## 2014-05-06 NOTE — Progress Notes (Signed)
Health Coaching  Session 6/12 Start time: 9:20AM  End time: 9:50AM Patient has lost another 2# and improved blood sugar: average dropped from 151 to 146mg /dl. She has been wlkaing 30 mibnutes 5 days a week and dling streches and leg lifts.  Meter download shows well controlled blood sugar with 88% in target. Weight today is 220.2#.  Patient Identified Concern:  Patient unhappy with slow weight loss. Goal is ultimately 210#.Marland Kitchen  Stage of Change Patient Is In:  Patient is in the preparation/action stage of change regarding weight loss. Patient Reported Barriers:  Feelings that she is not decreasing her weight enough.  Patient Reported Perceived Benefits:  Today acknowledges walking better, edema is better, has more energy and balance.  Patient Reports Self-Efficacy: Less confident today because of slowness of weight loss. Behavior Change Supports:  Daughter, her doctors and friends. Dr. Lynnae January spoke with patient today to encourage her to continue to eat healthy and walk/do exercises.  Goals: talk back to negative thinking. Patient Education:  Talk back to negative thinking.  Barriers to weight loss: Lack of confidence,  unrealistic expectations,  medications that cause weight gain. Balancing decreased calories and  medication to encourage weight loss with insulin and blood sugars can be challenging. Coordination of care: Meter download to Dr. Zenovia Jarred box.

## 2014-05-16 ENCOUNTER — Encounter: Payer: Self-pay | Admitting: *Deleted

## 2014-05-20 ENCOUNTER — Encounter: Payer: Self-pay | Admitting: Dietician

## 2014-05-20 ENCOUNTER — Ambulatory Visit: Payer: Medicare Other | Admitting: Dietician

## 2014-05-20 VITALS — Wt 222.4 lb

## 2014-05-20 DIAGNOSIS — E669 Obesity, unspecified: Secondary | ICD-10-CM

## 2014-05-20 NOTE — Patient Instructions (Addendum)
  To help with weight loss and blood sugar, you have 3 choices to help lower them without increasing your weight:  1-Increase your Activity/Exercise- the back pain is getting in the ways of this- what can you do     about the back pain? 2- Lower your Food intake- you think this is low enough 3- Change medicine-     A. Increase insulin back to what it was may cause weight gain    B. Consider different medication that will lower your blood sugars without causing weight

## 2014-05-20 NOTE — Progress Notes (Signed)
Health Coaching  Session 7/12 Start time: 9:20 AM  End time: 9:50 AM Patient gained 2# and did not bring her meter today. She reports blood sugars are higher/ Has not been walking because of back pain which she reports severity 4/5 today. She has gotten injections for her back pain./sciatica/pinched nerve and they worked to relieve her back pain for 2 months after each injection. However, she did not want to become dependent on the injections.  Patient Identified Concern: Lack of progress with weight loss. Back pain. Eats two meals snacks on fruit and two protein shakes/day. Does not see nay room for change in her diet. Her goal weight is ultimately 210#.  Stage of Change Patient Is In:  Patient is mostly in the preparation stage of change regarding weight loss. Patient Reported Barriers: Back pain per patient and  Feelings that she is not decreasing her weight enough.  Patient Reported Perceived Benefits:  More energy, balance blood sugars, health Patient Reports Self-Efficacy: Less confident today because of slowness of weight loss. Behavior Change Supports:  Daughter, her doctors and friends.   Goals: talk back to negative thinking. Patient Education:  Talk back to negative thinking.  Barriers to weight loss: Back pain/deconditioning. Lack of confidence,  unrealistic expectations,  medications that cause weight gain. Balancing decreased calories and  medication to encourage weight loss with insulin and blood sugars can be challenging. Coordination of care: Assisted patient with thinking through her options to achieve her goals despite the barrier of back pain.

## 2014-06-06 ENCOUNTER — Ambulatory Visit (INDEPENDENT_AMBULATORY_CARE_PROVIDER_SITE_OTHER): Payer: Medicare Other | Admitting: Internal Medicine

## 2014-06-06 ENCOUNTER — Encounter: Payer: Medicare Other | Admitting: Dietician

## 2014-06-06 ENCOUNTER — Encounter: Payer: Self-pay | Admitting: Internal Medicine

## 2014-06-06 VITALS — BP 130/72 | HR 62 | Temp 97.4°F | Wt 219.4 lb

## 2014-06-06 DIAGNOSIS — E1149 Type 2 diabetes mellitus with other diabetic neurological complication: Secondary | ICD-10-CM | POA: Diagnosis not present

## 2014-06-06 DIAGNOSIS — Z0000 Encounter for general adult medical examination without abnormal findings: Secondary | ICD-10-CM | POA: Diagnosis not present

## 2014-06-06 DIAGNOSIS — E1142 Type 2 diabetes mellitus with diabetic polyneuropathy: Secondary | ICD-10-CM

## 2014-06-06 DIAGNOSIS — E669 Obesity, unspecified: Secondary | ICD-10-CM

## 2014-06-06 DIAGNOSIS — E1140 Type 2 diabetes mellitus with diabetic neuropathy, unspecified: Secondary | ICD-10-CM

## 2014-06-06 NOTE — Patient Instructions (Signed)
General Instructions:  1. Use warm compresses on the abscess to encourage draining. If it doesn't get better, please let me know 2. See me in 3-4 months 3. Cont to see [name] to work on [employer] loss  Treatment Goals:  Goals (1 Years of Data) as of 06/06/14         As of Today 03/26/14 01/29/14 01/03/14 01/03/14     Blood Pressure    . Blood Pressure < 140/90  130/72 136/68 125/72 142/76 150/86     Lifestyle    . Prevent Falls           Result Component    . HEMOGLOBIN A1C < 7.0   7.3  7.6     . LDL CALC < 100   137         Progress Toward Treatment Goals:  Treatment Goal 06/06/2014  Hemoglobin A1C improved  Blood pressure at goal  Prevent falls -    Self Care Goals & Plans:  Self Care Goal 03/26/2014  Manage my medications take my medicines as prescribed; bring my medications to every visit; refill my medications on time  Monitor my health keep track of my blood glucose; bring my glucose meter and log to each visit; keep track of my blood pressure; check my feet daily  Eat healthy foods drink diet soda or water instead of juice or soda; eat more vegetables; eat foods that are low in salt; eat baked foods instead of fried foods  Be physically active find an activity I enjoy  Prevent falls -  Meeting treatment goals -    Home Blood Glucose Monitoring 06/06/2014  Check my blood sugar 2 times a day  When to check my blood sugar before breakfast; before dinner     Care Management & Community Referrals:  Referral 06/06/2014  Referrals made for care management support none needed  Referrals made to community resources -

## 2014-06-06 NOTE — Progress Notes (Signed)
   Subjective:    Patient ID: [name], female    DOB: [birth year], 63 y.o.   MRN: [id number]  Diabetes Pertinent negatives for diabetes include no chest pain.    Please see the A&P for the status of the pt's chronic medical problems.  Review of Systems  Constitutional: Positive for fever.  HENT: Positive for dental problem.   Cardiovascular: Negative for chest pain.  Musculoskeletal: Positive for arthralgias, back pain and gait problem.  Skin: Positive for wound.       Objective:   Physical Exam  Constitutional: She is oriented to person, place, and time. She appears well-developed and well-nourished. No distress.  HENT:  Head: Normocephalic and atraumatic.  Right Ear: External ear normal.  Left Ear: External ear normal.  Nose: Nose normal.  Eyes: Conjunctivae and EOM are normal.  Cardiovascular: Normal rate, regular rhythm and normal heart sounds.   Pulmonary/Chest: Effort normal.  Musculoskeletal: Normal range of motion. She exhibits edema.  Neurological: She is alert and oriented to person, place, and time.  Skin: Skin is warm and dry. She is not diaphoretic.  R lateral thoracic chest wall, under skin fold. About 4 cm abscess with erythema and pain. There are 3 pinpoint openings but paucity of drainage.   Psychiatric: She has a normal mood and affect. Her behavior is normal. Judgment and thought content normal.          Assessment & Plan:

## 2014-06-06 NOTE — Assessment & Plan Note (Signed)
Not too successful in wt loss but doing a great job working with [employer].

## 2014-06-06 NOTE — Assessment & Plan Note (Signed)
She has an abscess on R lateral thoracic wall. She states she gets these every time she gets sinus issues. She is on Amoxil 500 TID for a dental issue and it was Rx'd by her dentist. She thinks it opened up this AM and drained but not spontaneous drainage today. I attempted I&D but pt not able to tolerated and it was stopped. No pus was able to be expressed as I was not able to incise where the full abscess.   PROCEDURE NOTE Informed consent obtained. Ulis Rias did time out. Area prepped with betadine. Borders of abscess ID'd. Incision started with intention to incise entire length of abscess but pt stopped procedure. Hemostasis obtained easily.

## 2014-06-06 NOTE — Assessment & Plan Note (Signed)
BP Readings from Last 3 Encounters:  06/06/14 130/72  03/26/14 136/68  01/29/14 125/72   BP is great. Cont med - toprol 100, cozaar 50, lasix 20, clonidine 0.2, norvasc 10.

## 2014-06-06 NOTE — Assessment & Plan Note (Addendum)
She is working with [name] for weight loss and has lost 3 lbs. She takes 28 units in the AM and 15 units in the PM before dinner. She denies lows but has many elevated CBG's. Her A1C is decreasing so will leave doses as is but ave CGB has increased. [name] will call her to F/U. She just saw her optho and we will request her notes. Not interested in flu shot today.

## 2014-06-13 ENCOUNTER — Ambulatory Visit (INDEPENDENT_AMBULATORY_CARE_PROVIDER_SITE_OTHER): Payer: Medicare Other | Admitting: Dietician

## 2014-06-13 ENCOUNTER — Encounter: Payer: Self-pay | Admitting: Dietician

## 2014-06-13 VITALS — Wt 217.9 lb

## 2014-06-13 DIAGNOSIS — E1140 Type 2 diabetes mellitus with diabetic neuropathy, unspecified: Secondary | ICD-10-CM

## 2014-06-13 DIAGNOSIS — E1159 Type 2 diabetes mellitus with other circulatory complications: Secondary | ICD-10-CM | POA: Diagnosis not present

## 2014-06-13 DIAGNOSIS — Z7189 Other specified counseling: Secondary | ICD-10-CM | POA: Diagnosis not present

## 2014-06-13 DIAGNOSIS — E1142 Type 2 diabetes mellitus with diabetic polyneuropathy: Secondary | ICD-10-CM | POA: Diagnosis not present

## 2014-06-13 NOTE — Progress Notes (Signed)
Health Coaching  Session 8/12 Start time: 9:40 AM  End time: 10:15 AM Patient lost 4.5# in past month. Blood sugars are much improved. Back pain better and now walking some.  Patient Identified Concern: Worried about blood sugars going back up. Eats two meals snacks on fruit and two protein shakes/day. Does not perceive room for change in her diet. Her goal weight is ultimately 210#.  Stage of Change Patient Is In:  Patient is oscillating between the preparation and action stages of change regarding weight loss. Patient Reported Barriers:  Feelings that she is not decreasing her weight enough.  Patient Reported Perceived Benefits:  More energy, balance blood sugars, health Patient Reports Self-Efficacy:More confident today because of  weight loss. Behavior Change Supports:  Daughter, her doctors and friends.   Goals: talk back to negative thinking, walk as much as possible Patient Education:  Talk back to negative thinking, food recall and blood sugar download reveiw.  Barriers to weight loss: Back pain/deconditioning. Lack of confidence,  unrealistic expectations,  medications that cause weight gain.  Coordination of care: Meter download in physician box.

## 2014-06-13 NOTE — Patient Instructions (Signed)
Keep TALKING BACK to NEGATIVE Thinking!!!   Your sugars and weight are doing much better!!!  Keep walking as much as you can and eating healthier choices- try back on saturated fats- see list.

## 2014-07-01 ENCOUNTER — Other Ambulatory Visit: Payer: Self-pay | Admitting: Internal Medicine

## 2014-07-01 ENCOUNTER — Ambulatory Visit (INDEPENDENT_AMBULATORY_CARE_PROVIDER_SITE_OTHER): Payer: Medicare Other | Admitting: Dietician

## 2014-07-01 ENCOUNTER — Encounter: Payer: Self-pay | Admitting: Dietician

## 2014-07-01 VITALS — Wt 216.2 lb

## 2014-07-01 DIAGNOSIS — Z7189 Other specified counseling: Secondary | ICD-10-CM | POA: Diagnosis not present

## 2014-07-01 DIAGNOSIS — E1140 Type 2 diabetes mellitus with diabetic neuropathy, unspecified: Secondary | ICD-10-CM

## 2014-07-01 DIAGNOSIS — E1149 Type 2 diabetes mellitus with other diabetic neurological complication: Secondary | ICD-10-CM

## 2014-07-01 DIAGNOSIS — E1142 Type 2 diabetes mellitus with diabetic polyneuropathy: Secondary | ICD-10-CM | POA: Diagnosis not present

## 2014-07-01 NOTE — Progress Notes (Signed)
Health Coaching  Session 9/16 Start time: 9:40 AM  End time: 9:59AM AM Patient lost 1.6# in past weeks. (6.2# from her goal)  Blood sugars are  Improved-average 149, fasting average is 143. Back pain better and now walking some, but riding a stationary bike 2-3 times a day for 5-30 minutes each time.  Patient Identified Concern: Worried about blood sugars spikes. Eats two meals, snacks on fruit and two protein shakes/day.  goal weight is 210#.  Stage of Change Patient Is In:  Patient is in action stages of change regarding weight loss. Patient Reported Barriers:  None reported today. Pain medicine from abscesss in mouth may be helping along with non weight bearing activity.  Patient Reported Perceived Benefits:  More energy, balance blood sugars, feels good. Dress size decreased so she fits into more clothes. Patient Reports Self-Efficacy:More confident today because of  weight loss. Rates this 5/5. Behavior Change Supports:  Daughter, her doctors, church and friends.   Goals: Less problems with negative thinking, ride bike as much as possible Patient Education:  Weight trends and  blood sugar download reveiw.  Barriers to weight loss: Back pain improved, more confidence and realistic expectations, understands that low blood sugars are a sign she no longer needs as much insulin.  Coordination of care: Meter download in physician box.

## 2014-07-01 NOTE — Patient Instructions (Signed)
Please make a follow up appointment for 2 weeks

## 2014-07-10 ENCOUNTER — Other Ambulatory Visit: Payer: Self-pay | Admitting: Internal Medicine

## 2014-07-10 NOTE — Telephone Encounter (Signed)
Pls sch routine appt with me Nov, Dec, or Jan

## 2014-07-10 NOTE — Telephone Encounter (Signed)
Message sent to front desk pool about an appt as outline with Dr Lynnae January.

## 2014-07-15 ENCOUNTER — Encounter: Payer: Self-pay | Admitting: Dietician

## 2014-07-15 ENCOUNTER — Ambulatory Visit (INDEPENDENT_AMBULATORY_CARE_PROVIDER_SITE_OTHER): Payer: Medicare Other | Admitting: Dietician

## 2014-07-15 VITALS — Wt 217.2 lb

## 2014-07-15 DIAGNOSIS — Z7189 Other specified counseling: Secondary | ICD-10-CM

## 2014-07-15 DIAGNOSIS — E1140 Type 2 diabetes mellitus with diabetic neuropathy, unspecified: Secondary | ICD-10-CM | POA: Diagnosis not present

## 2014-07-15 NOTE — Progress Notes (Signed)
Health Coaching  Session 10/16 Start time: 9:25AM  End time: 9:45AM AM Patient's weight ~ the same.  Blood sugars reportedly good- no meter today. In range of 117-153 for most part, with most ~ 130. She reports her  fasting blood sugar today at home was 153, last night 117. Back pain better, on daily pain meds. Riding a stationary bike 3 times a day for 5  minutes each time.  Patient Identified Concern:  Constipation and fluid-She feels her protein and sodium intakes are adequate.  goal weight is 210#.  Stage of Change Patient Is In:  Patient is in action stages of change regarding weight loss. Patient Reported Barriers:  Constipation and fluid- taking lasix per instructions and contemplating stool softeners more regularly.  Patient Reported Perceived Benefits: Feels good. Dress size decreased. Patient Reports Self-Efficacy:More confident, dress size changing Behavior Change Supports:  Daughter, her doctors, church and friends.   Goals: Follow up in 2 weeks, continue diet and exercise, try stool softeners. Patient Education:  Stool softeners, needs to complete antibiotic course prescribed.   Marland Kitchen

## 2014-07-15 NOTE — Patient Instructions (Signed)
Please stop by front desk to make an appointment on 07-29-14 at 9:30  You said you are going to try your stool softner on a daily basis to relieve your constipation- especially when on pain meds  Your weight was 217.2 #  Great job excerising

## 2014-07-25 ENCOUNTER — Other Ambulatory Visit: Payer: Self-pay | Admitting: Internal Medicine

## 2014-07-29 ENCOUNTER — Ambulatory Visit (INDEPENDENT_AMBULATORY_CARE_PROVIDER_SITE_OTHER): Payer: Medicare Other | Admitting: Dietician

## 2014-07-29 ENCOUNTER — Encounter: Payer: Self-pay | Admitting: Dietician

## 2014-07-29 VITALS — Wt 213.5 lb

## 2014-07-29 DIAGNOSIS — Z7189 Other specified counseling: Secondary | ICD-10-CM | POA: Diagnosis not present

## 2014-07-29 DIAGNOSIS — E119 Type 2 diabetes mellitus without complications: Secondary | ICD-10-CM | POA: Diagnosis not present

## 2014-07-29 NOTE — Progress Notes (Signed)
Health Coaching  Session 11/16 Start time: 9:15 AM  End time: 9:30  AM Weight: decreased 3.7# in 2 weeks  .  Blood sugars: average 153. Range of 104-225.  average throughout the day 149-168- very stable Back pain better, on daily pain meds. Riding a stationary bike 3 times a day for 5  minutes each time.  Patient Identified Concern:  Constipation okay- she feels "bloated" says she takes the Senekot and colace as needed and the Miralax every few days. Fluid-She feels her protein and sodium intakes are adequate.  goal weight is 210#.  Stage of Change Patient Is In:  Patient is in action stages of change regarding weight loss. Patient Reported Barriers:  Constipation - contemplating stool softeners more regularly.  Patient Reported Perceived Benefits: Feels good. Dress size decreased. Patient Reports Self-Efficacy:More confident, dress size changing Behavior Change Supports:  Daughter, her doctors, church and friends.   Goals: Follow up in 2 weeks, continue diet and exercise, retry stool softeners. Patient Education:  Stool softeners, pharmacist talked to patient today about how to take them .   Marland Kitchen

## 2014-07-29 NOTE — Patient Instructions (Addendum)
Your weight was 213.5# today- way to go!!!  Your blood sugars are also excellent!!!    A good reminder that FEELINGS are NOT FACTS!   For your constipation-    1- senekot 2 tablets daily   2- colace 2 times daily   3- Miralax as needed   Please stop at the front desk to make an appointment for 2 weeks

## 2014-08-02 ENCOUNTER — Other Ambulatory Visit: Payer: Self-pay | Admitting: *Deleted

## 2014-08-02 ENCOUNTER — Telehealth: Payer: Self-pay | Admitting: *Deleted

## 2014-08-02 MED ORDER — IBUPROFEN 800 MG PO TABS: 800.0000 mg | ORAL_TABLET | Freq: Three times a day (TID) | ORAL | Status: DC | PRN

## 2014-08-12 ENCOUNTER — Encounter: Payer: Self-pay | Admitting: Dietician

## 2014-08-12 ENCOUNTER — Encounter: Payer: Self-pay | Admitting: *Deleted

## 2014-08-12 ENCOUNTER — Ambulatory Visit (INDEPENDENT_AMBULATORY_CARE_PROVIDER_SITE_OTHER): Payer: Medicare Other | Admitting: Dietician

## 2014-08-12 VITALS — Wt 214.0 lb

## 2014-08-12 DIAGNOSIS — E669 Obesity, unspecified: Secondary | ICD-10-CM

## 2014-08-12 DIAGNOSIS — E1140 Type 2 diabetes mellitus with diabetic neuropathy, unspecified: Secondary | ICD-10-CM | POA: Diagnosis not present

## 2014-08-12 NOTE — Progress Notes (Signed)
Health Coaching  Session 12/16 Start time: 9:15 AM  End time: 9:30  AM Weight: about the same.  Blood sugars: 75% in target, average 165-higher than last visit, , range 131-219 Back pain worse, swelling worse. Thinking about going back to pain/back doctor. Not riding a stationary bike sa much but does try to walk as much as possible.   Patient Identified Concern:  Constipation okay- she feels "bloated" says she takes the Senekot and colace as needed and the Miralax every few days. Fluid-She feels her sodium intake may have been too high this weekend due to eating out.  goal weight is 210#.  Stage of Change Patient Is In:  Patient is in action stage of change regarding weight loss. Patient Reported Barriers:  Constipation - contemplating stool softeners more regularly.  Patient Reported Perceived Benefits: improved health, blood sugars.   Patient Reports Self-Efficacy:More confident, dress size changing Behavior Change Supports:  Daughter, her doctors, church and friends.   Goals: Follow up in 4 weeks per patient request, continue diet and exercise, retry stool softeners. Patient Education: None today .

## 2014-08-22 NOTE — Telephone Encounter (Signed)
closed

## 2014-08-23 ENCOUNTER — Other Ambulatory Visit: Payer: Self-pay | Admitting: Internal Medicine

## 2014-09-09 ENCOUNTER — Ambulatory Visit (INDEPENDENT_AMBULATORY_CARE_PROVIDER_SITE_OTHER): Payer: Medicare Other | Admitting: Dietician

## 2014-09-09 ENCOUNTER — Encounter: Payer: Self-pay | Admitting: Dietician

## 2014-09-09 ENCOUNTER — Other Ambulatory Visit: Payer: Self-pay | Admitting: *Deleted

## 2014-09-09 VITALS — Wt 214.4 lb

## 2014-09-09 DIAGNOSIS — E669 Obesity, unspecified: Secondary | ICD-10-CM

## 2014-09-09 DIAGNOSIS — E1140 Type 2 diabetes mellitus with diabetic neuropathy, unspecified: Secondary | ICD-10-CM

## 2014-09-09 MED ORDER — GLUCOSE BLOOD VI STRP: ORAL_STRIP | Status: DC

## 2014-09-09 NOTE — Progress Notes (Signed)
Health Coaching  Session 13/16 Start time: 9:20 AM  End time: 9:40  AM Weight: no change. Blood sugars: 72% in target, average 167-2mg /dl higher than last visit, , range 104-255 Back pain still a main concern. Going to pain/back doctor today. Not much activity- feels best sitting.   Patient Identified Concern:  Constipation much improved.  goal weight is 210#.  Stage of Change Patient Is In:  Patient is in action stage of change regarding weight loss. Patient Reported Barriers: back pain- she verbalized that decreasing her weight may help her back pain.  Patient Reported Perceived Benefits: improved health, blood sugars.   Patient Reports Self-Efficacy:More confident. Behavior Change Supports:  Daughter, her doctors, church and friends. She is interested in group weight loss visits.   Goals: Follow up in January for group visit, agrees to record food intake for 2-3 days a week. Patient Education: Importance of self monitoring intake/food and beverages for weight loss. Marland Kitchen

## 2014-09-17 ENCOUNTER — Other Ambulatory Visit: Payer: Self-pay | Admitting: Internal Medicine

## 2014-10-17 ENCOUNTER — Ambulatory Visit (INDEPENDENT_AMBULATORY_CARE_PROVIDER_SITE_OTHER): Payer: Medicare Other | Admitting: Internal Medicine

## 2014-10-17 VITALS — BP 149/75 | HR 70 | Temp 98.2°F | Wt 212.7 lb

## 2014-10-17 DIAGNOSIS — I10 Essential (primary) hypertension: Secondary | ICD-10-CM | POA: Diagnosis not present

## 2014-10-17 DIAGNOSIS — I2510 Atherosclerotic heart disease of native coronary artery without angina pectoris: Secondary | ICD-10-CM | POA: Diagnosis not present

## 2014-10-17 DIAGNOSIS — Z0000 Encounter for general adult medical examination without abnormal findings: Secondary | ICD-10-CM

## 2014-10-17 DIAGNOSIS — Z7982 Long term (current) use of aspirin: Secondary | ICD-10-CM | POA: Diagnosis not present

## 2014-10-17 DIAGNOSIS — E119 Type 2 diabetes mellitus without complications: Secondary | ICD-10-CM

## 2014-10-17 DIAGNOSIS — D509 Iron deficiency anemia, unspecified: Secondary | ICD-10-CM

## 2014-10-17 DIAGNOSIS — Z794 Long term (current) use of insulin: Secondary | ICD-10-CM | POA: Diagnosis not present

## 2014-10-17 DIAGNOSIS — Z23 Encounter for immunization: Secondary | ICD-10-CM | POA: Diagnosis not present

## 2014-10-17 DIAGNOSIS — E669 Obesity, unspecified: Secondary | ICD-10-CM

## 2014-10-17 DIAGNOSIS — E785 Hyperlipidemia, unspecified: Secondary | ICD-10-CM

## 2014-10-17 DIAGNOSIS — E1140 Type 2 diabetes mellitus with diabetic neuropathy, unspecified: Secondary | ICD-10-CM

## 2014-10-17 LAB — BASIC METABOLIC PANEL WITH GFR
BUN: 15 mg/dL (ref 6–23)
CALCIUM: 9.5 mg/dL (ref 8.4–10.5)
CHLORIDE: 107 meq/L (ref 96–112)
CO2: 24 meq/L (ref 19–32)
Creat: 0.8 mg/dL (ref 0.50–1.10)
GFR, EST NON AFRICAN AMERICAN: 79 mL/min
GFR, Est African American: >89 mL/min
GLUCOSE: 95 mg/dL (ref 70–99)
POTASSIUM: 4.2 meq/L (ref 3.5–5.3)
SODIUM: 140 meq/L (ref 135–145)

## 2014-10-17 LAB — CBC
HCT: 37.4 % (ref 36.0–46.0)
Hemoglobin: 12.1 g/dL (ref 12.0–15.0)
MCH: 28.2 pg (ref 26.0–34.0)
MCHC: 32.4 g/dL (ref 30.0–36.0)
MCV: 87.2 fL (ref 78.0–100.0)
MPV: 10.6 fL (ref 8.6–12.4)
Platelets: 297 10*3/uL (ref 150–400)
RBC: 4.29 MIL/uL (ref 3.87–5.11)
RDW: 16.2 % — ABNORMAL HIGH (ref 11.5–15.5)
WBC: 6.3 10*3/uL (ref 4.0–10.5)

## 2014-10-17 LAB — LIPID PANEL
CHOLESTEROL: 216 mg/dL — AB (ref 0–200)
HDL: 58 mg/dL (ref >39)
LDL CALC: 142 mg/dL — AB (ref 0–99)
Total CHOL/HDL Ratio: 3.7 Ratio
Triglycerides: 80 mg/dL (ref <150)
VLDL: 16 mg/dL (ref 0–40)

## 2014-10-17 LAB — FERRITIN: Ferritin: 52 ng/mL (ref 10–291)

## 2014-10-17 LAB — GLUCOSE, CAPILLARY: GLUCOSE-CAPILLARY: 123 mg/dL — AB (ref 70–99)

## 2014-10-17 LAB — POCT GLYCOSYLATED HEMOGLOBIN (HGB A1C): Hemoglobin A1C: 7.7

## 2014-10-17 NOTE — Assessment & Plan Note (Signed)
No CP. On BB, ASA, statin.

## 2014-10-17 NOTE — Assessment & Plan Note (Signed)
BP Readings from Last 3 Encounters:  10/17/14 149/75  06/06/14 130/72  03/26/14 136/68   Slightly high today but I forgot to recheck it. Recheck in 3 months.

## 2014-10-17 NOTE — Assessment & Plan Note (Signed)
A1C trend 7.7 - 7.6 - 7.3 - 7.7 today CBG trend not too bad except 170's post dinner and early AM. Otherwise 120 - 150. She realized that her dinner fruit and veg smoothie is likely spiking her sugar. I offered three options. 1. Increase metformin back up 2. Increase predinner insulin 3. Stop smoothie   She elected #3. She will check back in 3 months to see success.   Cont metformin 500 BID and 70/30 28 AM and 15 PM. No lows.  She cont to loose wt with Donna's help!

## 2014-10-17 NOTE — Assessment & Plan Note (Signed)
On lipitor 80. Denies missed doses. Takes in AM. Last LDL was 137 in June. Repeating today. On high dose statin already.

## 2014-10-17 NOTE — Patient Instructions (Signed)
1. See me in 3 months. 2. Stop the fruit smoothies to try to decrease your post dinner sugar and early AM sugar 3. I will mail you your test results

## 2014-10-17 NOTE — Assessment & Plan Note (Signed)
prevnar today.

## 2014-10-17 NOTE — Assessment & Plan Note (Signed)
Losing wt!! She is biking on stationary bike 2 or 3 times daily while watching TV. Also will stand up and run in place. Also has trampoline to jump on. Not hurting her knees. In summer she walks. Goal is 180.  Wt Readings from Last 3 Encounters:  10/17/14 212 lb 11.2 oz (96.48 kg)  09/09/14 214 lb 6.4 oz (97.251 kg)  08/12/14 214 lb (97.07 kg)

## 2014-10-17 NOTE — Assessment & Plan Note (Signed)
Has not had CBC nor ferritin for some time so checking today. Unable to tolerate oral iron 2/2 constipation.

## 2014-10-17 NOTE — Progress Notes (Signed)
   Subjective:    Patient ID: [name], female    DOB: 20-Jan-1951, 64 y.o.   MRN: [id number]  HPI  Please see the A&P for the status of the pt's chronic medical problems.  Review of Systems  Constitutional: Negative for unexpected weight change.  HENT: Negative for rhinorrhea.   Eyes: Negative for itching.  Respiratory: Negative for shortness of breath.   Cardiovascular: Negative for chest pain.  Gastrointestinal: Negative for constipation.  Musculoskeletal: Negative for joint swelling, arthralgias and gait problem.  Skin: Positive for wound.       Objective:   Physical Exam  Constitutional: She is oriented to person, place, and time. She appears well-developed and well-nourished. No distress.  HENT:  Head: Normocephalic and atraumatic.  Right Ear: External ear normal.  Left Ear: External ear normal.  Nose: Nose normal.  Eyes: Conjunctivae and EOM are normal. Right eye exhibits no discharge. Left eye exhibits no discharge.  Cardiovascular: Normal rate, regular rhythm and normal heart sounds.   No murmur heard. Pulmonary/Chest: Effort normal and breath sounds normal.  Musculoskeletal: She exhibits edema.  Neurological: She is alert and oriented to person, place, and time.  Skin: Skin is warm and dry. She is not diaphoretic.  Right lateral thorax there is a hyperpigmented area with limited area of subQ scar tissue. No abscess.   Psychiatric: She has a normal mood and affect. Her behavior is normal. Judgment and thought content normal.          Assessment & Plan:

## 2014-10-18 ENCOUNTER — Encounter: Payer: Self-pay | Admitting: Internal Medicine

## 2014-10-31 ENCOUNTER — Other Ambulatory Visit: Payer: Self-pay | Admitting: Dietician

## 2014-10-31 ENCOUNTER — Ambulatory Visit (INDEPENDENT_AMBULATORY_CARE_PROVIDER_SITE_OTHER): Payer: Medicare Other | Admitting: Dietician

## 2014-10-31 DIAGNOSIS — E1140 Type 2 diabetes mellitus with diabetic neuropathy, unspecified: Secondary | ICD-10-CM

## 2014-10-31 DIAGNOSIS — Z7189 Other specified counseling: Secondary | ICD-10-CM | POA: Diagnosis present

## 2014-10-31 NOTE — Progress Notes (Signed)
Health Coaching  Session 14/16 Start time: 10:25 AM  End time: 10:59  AM Weight: increased- patient reports has more edema, has stopped lasix due to cramping. Blood sugars: 91% in target, average 136- a dramatic improvement, still highest in PM Back pain still a main concern.   She has cut back on smoothies to decrease her blood sugars.  Patient Identified Concern:  Constipation improved.  goal weight is 200#. Thinking about monitoring fluid intake to help with fluid.  Stage of Change Patient Is In:  Patient is in action stage of change regarding weight loss. Patient Reported Barriers: back pain- Patient Reported Perceived Benefits: improved health, blood sugars.   Patient Reports Self-Efficacy:More confident- rated this  5/5 today Behavior Change Supports:  Daughter, her doctors, church and friends. She is interested in group weight loss visits.   Goals: Follow up 2 weeks until group visit available, Keep exercising as much as possible, read labels for sodium to help reduce fluid.  Patient Education: Importance of self monitoring intake/food and beverages for weight loss. Marland Kitchen

## 2014-10-31 NOTE — Patient Instructions (Addendum)
Watch my sodium intake by paying more attention to the food label. ( Check out the sausage patty label, consider something different at [facility].... Whopper Junior has 520 mg sodium )  Goal sodium for each meal is 500 mg.   Keep exercising every day as much as you can.   Keep recording food intake.

## 2014-10-31 NOTE — Progress Notes (Signed)
Requesting a referral  for DSMT and MNT for 2016

## 2014-11-14 ENCOUNTER — Encounter: Payer: Self-pay | Admitting: Dietician

## 2014-11-14 ENCOUNTER — Ambulatory Visit (INDEPENDENT_AMBULATORY_CARE_PROVIDER_SITE_OTHER): Payer: Medicare Other | Admitting: Dietician

## 2014-11-14 ENCOUNTER — Other Ambulatory Visit: Payer: Self-pay | Admitting: Internal Medicine

## 2014-11-14 ENCOUNTER — Ambulatory Visit: Payer: Medicare Other | Admitting: Dietician

## 2014-11-14 VITALS — Wt 215.0 lb

## 2014-11-14 DIAGNOSIS — E669 Obesity, unspecified: Secondary | ICD-10-CM | POA: Diagnosis not present

## 2014-11-14 DIAGNOSIS — E1140 Type 2 diabetes mellitus with diabetic neuropathy, unspecified: Secondary | ICD-10-CM | POA: Diagnosis not present

## 2014-11-14 DIAGNOSIS — Z794 Long term (current) use of insulin: Secondary | ICD-10-CM

## 2014-11-14 MED ORDER — INSULIN ISOPHANE & REGULAR (HUMAN 70-30)100 UNIT/ML KWIKPEN: PEN_INJECTOR | SUBCUTANEOUS | Status: DC

## 2014-11-14 NOTE — Patient Instructions (Addendum)
I will call you about your next visit- maybe coming early to make it a group.   Keep exercising every day as much as you can.   Keep recording food intake.   Can call the pharmacy line on the back of your [employer] card to ask them if they cover Humulin 70/30 Claiborne Rigg

## 2014-11-14 NOTE — Progress Notes (Signed)
Health Coaching Notes 15/16 Appt start time: 1030 end time:  0932. Weight 215# increased- patient has increased her insulin in PM to 25 units as she says this was what she was instructed to do. This is likely assisting with weight regain.  Stopped eating sausage biscuits and allowing her family to pick up her meals at fast food. Is trying to cook what she eats. Edema, constipation, blood sugars improved. She thinks her decreased activity is preventing weight loss and hopes when weather is nicer she can be more active and continue her weight loss.  Patient Identified Concern: : Weight management and Blood sugar control.  Stage of Change Patient Is In:  : Ready, Change in progress Patient Reported Barriers: pain in right hip leg that prevents her from being more active, balancing priorities between controlling blood sugar and weight loss.  Patient Reported Perceived Benefits:  Feel better about herself, better health, less stress on joints Patient Reports Self-Efficacy:  Improving Behavior Change Supports:  Daughter, CDE, doctor Goals:  Weight < 200# eventually 160s, a1C < 7% Patient Education:  insulin types, action and their effect on weight. Patient desires to use humulin 70/30 kwik pen. Patient requests CDE to request insulin pens for her.   Monitoring/Evaluation:  Dietary intake, exercise, meter, and body weight in 2 week(s).

## 2014-11-15 ENCOUNTER — Telehealth: Payer: Self-pay | Admitting: Dietician

## 2014-11-15 NOTE — Telephone Encounter (Signed)
Asked her to reduce her pm insulin dose to 18 units. Discussed also reducing snack intake if blood sugar not at goal Before bed and overall food intake generally. Asked patient to check blood sugar before bed. She repeated back her new Pm dose of insulin as 18 units.  She also says that human rejected the insulin pen.

## 2014-11-29 ENCOUNTER — Ambulatory Visit (INDEPENDENT_AMBULATORY_CARE_PROVIDER_SITE_OTHER): Payer: Medicare Other | Admitting: Dietician

## 2014-11-29 ENCOUNTER — Encounter: Payer: Self-pay | Admitting: Dietician

## 2014-11-29 VITALS — Ht 61.0 in | Wt 214.5 lb

## 2014-11-29 DIAGNOSIS — E1140 Type 2 diabetes mellitus with diabetic neuropathy, unspecified: Secondary | ICD-10-CM | POA: Diagnosis not present

## 2014-11-29 NOTE — Progress Notes (Signed)
Health Coaching Notes 16/16 Appt start time: 1000 end time:  1045. Weight 214.5# increased- patient has decreased her insulin in PM to 18 units, made some adjustment in her food to correct her high blood sugars, got the insulin pens. She had hoped for a group session today for support, but the two other participants did not show. She is thinking about doing yoga to see if it might help her leg/hip pain. Needs pen needle rx.  Meter download: average is 135, overall they are much improved, 2 bedtime readings are 129 and 133, fastings mostly < 140, consistent with 18 units at hs being appropriate.  Stopped eating sausage biscuits and allowing her family to pick up her meals at fast food. Is trying to cook what she eats. Edema, constipation, blood sugars improved. She thinks her decreased activity is preventing weight loss and hopes when weather is nicer she can be more active and continue her weight loss.  Patient Identified Concern: : Weight management and Blood sugar control.  Stage of Change Patient Is In:  : Ready, Change in progress Patient Reported Barriers: Has not been exercising for past 2 weeks. pain in right hip/leg that prevents her from being more active, balancing priorities between controlling blood sugar and weight loss.  Patient Reported Perceived Benefits:  Feel better about herself, better health, less stress on joints Patient Reports Self-Efficacy:  Improving Behavior Change Supports:  Daughter, CDE, doctor Goals:  Weight < 200# eventually 160s, a1C < 7% Patient Education: .Was instructed on use of pens today and provided with written instruction sheet. Also provided with healthy recipes, magnet of low carb snack she can eat at bedtime. Free Yoga opportunities in her area   Monitoring/Evaluation:  Dietary intake, exercise, meter, and body weight in 2 week(s).

## 2014-11-29 NOTE — Patient Instructions (Signed)
You are doing great cutting back your food intake to control your blood sugars, fluid and weight!  Your one goal for the next 2 weeks is to call one of the recreation centers to find out what they offer.   Yoga straps-

## 2014-12-03 ENCOUNTER — Other Ambulatory Visit: Payer: Self-pay | Admitting: *Deleted

## 2014-12-03 MED ORDER — ACCU-CHEK FASTCLIX LANCETS MISC: Status: DC

## 2014-12-03 NOTE — Telephone Encounter (Signed)
Need ICD - 10 code

## 2014-12-11 ENCOUNTER — Other Ambulatory Visit: Payer: Self-pay | Admitting: *Deleted

## 2014-12-11 ENCOUNTER — Other Ambulatory Visit: Payer: Self-pay | Admitting: Internal Medicine

## 2014-12-11 DIAGNOSIS — E1140 Type 2 diabetes mellitus with diabetic neuropathy, unspecified: Secondary | ICD-10-CM

## 2014-12-12 MED ORDER — "INSULIN SYRINGE-NEEDLE U-100 31G X 15/64"" 0.5 ML MISC": 1.0000 | Freq: Two times a day (BID) | Status: DC

## 2014-12-16 ENCOUNTER — Ambulatory Visit (INDEPENDENT_AMBULATORY_CARE_PROVIDER_SITE_OTHER): Payer: Medicare Other | Admitting: Cardiology

## 2014-12-16 ENCOUNTER — Encounter: Payer: Self-pay | Admitting: Cardiology

## 2014-12-16 ENCOUNTER — Other Ambulatory Visit: Payer: Self-pay | Admitting: *Deleted

## 2014-12-16 VITALS — BP 140/76 | HR 60 | Ht 61.0 in | Wt 213.0 lb

## 2014-12-16 DIAGNOSIS — I2510 Atherosclerotic heart disease of native coronary artery without angina pectoris: Secondary | ICD-10-CM

## 2014-12-16 DIAGNOSIS — R0789 Other chest pain: Secondary | ICD-10-CM | POA: Diagnosis not present

## 2014-12-16 MED ORDER — NITROGLYCERIN 0.4 MG SL SUBL: 0.4000 mg | SUBLINGUAL_TABLET | SUBLINGUAL | Status: DC | PRN

## 2014-12-16 NOTE — Patient Instructions (Signed)
Your physician recommends that you schedule a follow-up appointment in: one year with Dr. Percival Spanish  We have reordered your NTG SL

## 2014-12-16 NOTE — Progress Notes (Signed)
The patient presents for follow up of  CAD. She does some activities. She's somewhat limited by sciatica and back pain. She does get chest discomfort when she does some walking or stationary bicycle. This is sporadic. She thinks it's a stable pattern however. It goes away quickly when she stops it she is doing. She does not get any resting symptoms. She does not have any new shortness of breath, PND or orthopnea. She has no new palpitations, presyncope or syncope. She's had some chronic lower extremity edema.  Allergies  Allergen Reactions  . Lisinopril Other (See Comments)    Chronic cough secondary to ACE  . Oxycodone-Acetaminophen Hives and Itching    flushing    Current Outpatient Prescriptions  Medication Sig Dispense Refill  . ACCU-CHEK FASTCLIX LANCETS MISC USE ONE LANCET THREE TIMES DAILY BEFORE MEALS. DX CODE: E11.40 102 each 11  . amLODipine (NORVASC) 10 MG tablet TAKE ONE (1) TABLET BY MOUTH EVERY DAY 90 tablet 3  . aspirin EC 81 MG tablet Take 81 mg by mouth daily.    Marland Kitchen atorvastatin (LIPITOR) 80 MG tablet TAKE ONE TABLET BY MOUTH EVERY DAY 30 tablet 11  . Bilberry, Vaccinium myrtillus, (BILBERRY PO) Take 1,000 mg by mouth daily.      . Calcium-Magnesium-Vitamin D (CALCIUM MAGNESIUM PO) Take 1 tablet by mouth daily.    . cimetidine (TAGAMET) 200 MG tablet Take 200 mg by mouth 2 (two) times daily.    . cloNIDine (CATAPRES) 0.2 MG tablet Take 1 tablet (0.2 mg total) by mouth daily. 180 tablet 3  . docusate sodium (COLACE) 100 MG capsule Take 100 mg by mouth 2 (two) times daily.    . furosemide (LASIX) 20 MG tablet Take 1 tablet (20 mg total) by mouth daily as needed. 30 tablet 2  . gabapentin (NEURONTIN) 300 MG capsule TAKE ONE (1) CAPSULE BY MOUTH 2 TIMES DAILY 60 capsule 11  . glucose 4 GM chewable tablet Chew 4 tablets (16 g total) by mouth as needed for low blood sugar. 50 tablet 12  . glucose blood (ACCU-CHEK SMARTVIEW) test strip Check blood sugar up to 3 times daily  Dx  code 250.00. Insulin dependent 100 each 11  . ibuprofen (ADVIL,MOTRIN) 800 MG tablet Take 1 tablet (800 mg total) by mouth every 8 (eight) hours as needed. For pain 30 tablet 1  . Insulin Isophane & Regular Human (HUMULIN 70/30 MIX) (70-30) 100 UNIT/ML PEN Inject 28 units before morning meal and 15 units before evening meal. 15 mL 11  . Insulin Syringe-Needle U-100 31G X 15/64" 0.5 ML MISC 1 applicator by Does not apply route 2 (two) times daily. Dx code E11.40 100 each 12  . IRON PO Take 27 mg by mouth daily.    Marland Kitchen loratadine (CLARITIN) 10 MG tablet TAKE ONE TABLET BY MOUTH ONCE DAILY FOR ALLERGIES 90 tablet 3  . losartan (COZAAR) 50 MG tablet TAKE ONE (1) TABLET BY MOUTH EVERY DAY 30 tablet 11  . metFORMIN (GLUCOPHAGE) 500 MG tablet TAKE ONE (1) TABLET BY MOUTH TWO (2) TIMES DAILY WITH A MEAL 180 tablet 3  . metoprolol succinate (TOPROL-XL) 100 MG 24 hr tablet Take 1 tablet (100 mg total) by mouth daily. 90 tablet 3  . mometasone (NASONEX) 50 MCG/ACT nasal spray Place 2 sprays into the nose daily. 17 g 11  . Multiple Vitamins-Minerals (WOMENS ONE DAILY) TABS Take 1 tablet by mouth daily.    . nitroGLYCERIN (NITRODUR - DOSED IN MG/24 HR) 0.4 mg/hr  patch Place 1 patch (0.4 mg total) onto the skin daily. Leave on for 12 hours then remove for 12 hours before placing new patch. 90 patch 3  . Nutritional Supplements (GRAPESEED EXTRACT PO) Take 650 mg by mouth daily.    . Olopatadine HCl (PATADAY) 0.2 % SOLN Place 1 drop into both eyes daily as needed. For allergies    . polyethylene glycol powder (GLYCOLAX/MIRALAX) powder Take 17 g by mouth daily as needed. For constipation    . Potassium 99 MG TABS Take 99 mg by mouth daily.    Marland Kitchen senna (SENOKOT) 8.6 MG TABS tablet Take 2 tablets (17.2 mg total) by mouth daily. 60 each 2  . Turmeric, Curcuma Longa, POWD 1 scoop by Does not apply route daily.    . nitroGLYCERIN (NITROSTAT) 0.4 MG SL tablet Place 1 tablet (0.4 mg total) under the tongue every 5 (five)  minutes as needed for chest pain. 25 tablet 3   No current facility-administered medications for this visit.    Past Medical History  Diagnosis Date  . Chest pain 11/07    cath 11/07 : LAD luminal irregularities, ramus intermediate with superior branch 70% stenosis, circumflex 50% stenosis, marginal 50% stenosis, well preserved ejection fraction. Stress test Jan 2013 : suggestion of lateral wall ischemia, likely artifact  . Hypertension     Requires 5 drug therapy and still difficult to control.  Marland Kitchen GERD (gastroesophageal reflux disease)   . Hyperlipidemia     On daily statin  . Obesity     Morbid. HAs worked with [name] T previously.  . Colonic polyp 1995    Colonoscopies 1994, 2000, and 02/02/2011. Last had 9 polyps - Tubular adenoma and tubulovillous was the pathology results without high grade dysplasia  . Chronic pain     MR 08/07/10 :  Spondylosis L4-5 with B foraminal narrowing and L4 nerve root enchroachment B.  . Seasonal allergies     seasonal  . Iron deficiency anemia     Ferritin was 12 in 2012. on FeSo4. Has had colon and EGD 02/02/11 that showed mild gastritis and colon polyps.  . Chronic venous insufficiency 2012    Has had full W/U incl ECHO, LFT's, Cr, Umicro, TSH, BNP. Symptomatic treatment.  . Diastolic CHF, chronic     NL EF by echo 01/2012, Gr I dd  . Arthritis     knees  . 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 S/p lumpectomy and radiation.  . Diabetes mellitus type II     Insulin dependent. Has worked with [name] R previously.  . OSA (obstructive sleep apnea) 02/16/2007    02/2007 : Moderate AHI 35.6, O2 sat decreased to 65%, CPAP titration to 16 with AHI of 3.6. 02/2014 : Rare respiratory events with sleep disturbance, within normal limits. AHI 0.4 per hour        Past Surgical History  Procedure Laterality Date  . Colonoscopy w/ polypectomy  1994, 2000, 2012  . Total abdominal hysterectomy  1995    2/2 uterine  cancer  . Carotid dopplers  08/24/2007    Normal. Done 2/2 dizziness.  . Cardiac catheterization  2007    Non obstructing CAD - [place]  . Breast lumpectomy w/ needle localization  2006    34 Chemo RX    ROS: Back pain and sciatic pain.  Otherwise as stated in the HPI and negative for all other systems.   PHYSICAL EXAM BP 140/76  mmHg  Pulse 60  Ht 5\' 1"  (1.549 m)  Wt 213 lb (96.616 kg)  BMI 40.27 kg/m2  LMP 07/12/1984 GENERAL:  Well appearing HEENT:  Pupils equal round and reactive, fundi not visualized, oral mucosa unremarkable NECK:  No jugular venous distention, waveform within normal limits, carotid upstroke brisk and symmetric, no bruits, no thyromegaly LYMPHATICS:  No cervical, inguinal adenopathy LUNGS:  Clear to auscultation bilaterally BACK:  No CVA tenderness HEART:  PMI not displaced or sustained,S1 and S2 within normal limits, no S3, no S4, no clicks, no rubs, no murmurs ABD:  Flat, positive bowel sounds normal in frequency in pitch, no bruits, no rebound, no guarding, no midline pulsatile mass, no hepatomegaly, no splenomegaly, obese EXT:  2 plus pulses throughout, mild leg edema, no cyanosis no clubbing  EKG:  Sinus rhythm, rate 60, no acute ST-T wave changes.   12/16/2014  ASSESSMENT AND PLAN   CAD:  She has had no new symptoms since a stress test last January 2013. She does have some exertional discomfort but this has been a stable pattern.  She would prefer more conservative testing as the symptoms are not increased. She will however left me know if they worsen. She's to continue with aggressive risk reduction.  HTN:  Her blood pressure is elevated only when she is in pain.Marland Kitchen She will continue on the meds as listed.  HYPERLIPIDEMIA:  I reviewed her last lipids in her LDL was 142.  However, she just had her meds adjusted. I will defer follow-up to Dr. Lynnae January.   DIABETES:  Her hemoglobin A1c was 7.7 in Jan.  She is following with Dr. Lynnae January.   OBESITY:   The  patient understands the need to lose weight with diet and exercise. We have discussed specific strategies for this.

## 2014-12-18 ENCOUNTER — Telehealth: Payer: Self-pay | Admitting: Dietician

## 2014-12-18 NOTE — Telephone Encounter (Signed)
Call to patient to confirm appointment for 12/19/14 at 9:30 lmtcb

## 2014-12-19 ENCOUNTER — Ambulatory Visit: Payer: Medicare Other | Admitting: Dietician

## 2014-12-23 ENCOUNTER — Ambulatory Visit (INDEPENDENT_AMBULATORY_CARE_PROVIDER_SITE_OTHER): Payer: Medicare Other | Admitting: Dietician

## 2014-12-23 ENCOUNTER — Encounter: Payer: Self-pay | Admitting: Dietician

## 2014-12-23 VITALS — Wt 214.4 lb

## 2014-12-23 DIAGNOSIS — E669 Obesity, unspecified: Secondary | ICD-10-CM | POA: Diagnosis present

## 2014-12-23 NOTE — Patient Instructions (Signed)
I hope your pain gets better soon!   You are doing great at keeping your weight and blood sugars in check despite pain and fluid.   See in two weeks- if you bring your food records, I am happy to go over them.

## 2014-12-23 NOTE — Progress Notes (Signed)
Health Coaching Notes Visit 17 and weight loss 10# since 02/21/14.  Appt start time: 9:30 end time:  1000. Weight 214.4# stable- pain level is a "6" today despite pain medicine this am. She says the pain is form her cholesterol medicine. She also says her fluid retention is increased today after eating out at fast food several times and indulging in high calorie and high salt foods.  Meter download: average is 143, a few spikes from when she ate out, fasting average ~ 141, taking 28 units in AM and 18  Before dinner in PM. .  Patient Identified Concern: : Weight management and Blood sugar control.  Stage of Change Patient Is In:  Pre-contemplative in regards to  decreased sodium/fast foods, Change in progress in other areas Patient Reported Barriers: Has not been exercising for past 2 weeks. pain in right hip/leg that prevents her from being more active, balancing priorities between controlling blood sugar and weight loss.  Patient Reported Perceived Benefits:  Feel better about herself, better health, less stress on joints Patient Reports Self-Efficacy:  Improving Behavior Change Supports:  Daughter, CDE, doctor Goals:  Weight < 200# eventually 160s, a1C < 7% Patient Education: .Was instructed on lower sodium choices when eating out. Used reflective listening to assist in self manging pain and sweeling  Monitoring/Evaluation:  Dietary intake, exercise, meter, and reevaluation of goals, body weight in 2 week(s).

## 2015-01-01 ENCOUNTER — Telehealth: Payer: Self-pay | Admitting: Dietician

## 2015-01-01 NOTE — Telephone Encounter (Signed)
Call to patient to confirm appointment for 01/02/15 at 9:30 lmtcb

## 2015-01-02 ENCOUNTER — Ambulatory Visit (INDEPENDENT_AMBULATORY_CARE_PROVIDER_SITE_OTHER): Payer: Medicare Other | Admitting: Dietician

## 2015-01-02 ENCOUNTER — Encounter: Payer: Self-pay | Admitting: Dietician

## 2015-01-02 ENCOUNTER — Other Ambulatory Visit: Payer: Self-pay | Admitting: *Deleted

## 2015-01-02 VITALS — Wt 211.8 lb

## 2015-01-02 DIAGNOSIS — E1140 Type 2 diabetes mellitus with diabetic neuropathy, unspecified: Secondary | ICD-10-CM | POA: Diagnosis present

## 2015-01-02 NOTE — Telephone Encounter (Signed)
Pt changing to mail order pharmacy.  90 days please  Also pt wants a refill on Tramadol 50 mg.  Last refill in 2014.  She uses alternately with IBU

## 2015-01-02 NOTE — Progress Notes (Signed)
Health Coaching Notes Visit 18; weight loss 13# since 02/21/14.  Appt start time: 10:00 end time:  1030. Weight 211.3# today- pain level is a "6" today. Complains of leg cramping and is drinking beet juice for it's potassium. She says her fluid retention is increased today and she attributes this to her medicine. Her food logs was reveiwed and shows eating out at fast food, regular soda and indulging in high salt foods. Bikes on stationary 3 x a day for few minutes each time- Meter download: averages: 90 day:138,      30 day:137,      14 day 134       7 day 129  Diabetes medicine: insulin 70/30 taking 28 units in AM and 18  Before dinner in PM. .  Patient Identified Concern: : Weight management and Blood sugar control.  Stage of Change Patient Is In:  Pre- to Contemplative in regards to  decreased sodium/fast foods, Change in progress in other areas Patient Reported Barriers: Pain in right hip/leg that prevents her from being more active, balancing priorities between controlling blood sugar and weight loss.  Patient Reported Perceived Benefits:  Feel better about herself, better health, less stress on joints Patient Reports Self-Efficacy:  Improving Behavior Change Supports:  Daughter, CDE, doctor, was signed up for group visit today, but transportation was late. Try again next visit Goals:  Weight < 200# eventually 160s, a1C < 7% Patient Education: .Was asked to track her sodium intakes for at least one day. Used reflective listening to assist in self manging pain and swelling  Monitoring/Evaluation:  Dietary intake, exercise, meter, and reevaluation of goals, body weight in 2 week(s).

## 2015-01-02 NOTE — Patient Instructions (Addendum)
Your weight is decreasing and your blood a sugars are decreasing!  Please bring one day of food record with you.   Add the sodium up for one day.  The recommendation is for 1500- 2300 mg or less a day.   Keep exercising and eating lot's of vegetables.   Tell transportation your appointment is at 9 AM.

## 2015-01-03 ENCOUNTER — Other Ambulatory Visit: Payer: Self-pay

## 2015-01-03 MED ORDER — NITROGLYCERIN 0.4 MG/HR TD PT24: 0.4000 mg | MEDICATED_PATCH | Freq: Every day | TRANSDERMAL | Status: DC

## 2015-01-03 MED ORDER — NITROGLYCERIN 0.4 MG SL SUBL: 0.4000 mg | SUBLINGUAL_TABLET | SUBLINGUAL | Status: DC | PRN

## 2015-01-06 ENCOUNTER — Other Ambulatory Visit: Payer: Self-pay | Admitting: *Deleted

## 2015-01-06 ENCOUNTER — Other Ambulatory Visit: Payer: Self-pay | Admitting: Internal Medicine

## 2015-01-06 DIAGNOSIS — G8929 Other chronic pain: Secondary | ICD-10-CM

## 2015-01-06 MED ORDER — ATORVASTATIN CALCIUM 80 MG PO TABS: ORAL_TABLET | ORAL | Status: DC

## 2015-01-06 MED ORDER — IBUPROFEN 800 MG PO TABS: 800.0000 mg | ORAL_TABLET | Freq: Three times a day (TID) | ORAL | Status: DC | PRN

## 2015-01-06 MED ORDER — LOSARTAN POTASSIUM 50 MG PO TABS: ORAL_TABLET | ORAL | Status: DC

## 2015-01-06 MED ORDER — CLONIDINE HCL 0.2 MG PO TABS: 0.2000 mg | ORAL_TABLET | Freq: Every day | ORAL | Status: DC

## 2015-01-06 MED ORDER — AMLODIPINE BESYLATE 10 MG PO TABS: ORAL_TABLET | ORAL | Status: DC

## 2015-01-06 MED ORDER — NITROGLYCERIN 0.4 MG SL SUBL: 0.4000 mg | SUBLINGUAL_TABLET | SUBLINGUAL | Status: DC | PRN

## 2015-01-06 MED ORDER — METOPROLOL SUCCINATE ER 100 MG PO TB24: 100.0000 mg | ORAL_TABLET | Freq: Every day | ORAL | Status: DC

## 2015-01-06 MED ORDER — METFORMIN HCL 500 MG PO TABS: ORAL_TABLET | ORAL | Status: DC

## 2015-01-06 MED ORDER — TRAMADOL HCL 50 MG PO TABS: 50.0000 mg | ORAL_TABLET | Freq: Four times a day (QID) | ORAL | Status: DC | PRN

## 2015-01-06 MED ORDER — GABAPENTIN 300 MG PO CAPS: ORAL_CAPSULE | ORAL | Status: DC

## 2015-01-16 ENCOUNTER — Ambulatory Visit (INDEPENDENT_AMBULATORY_CARE_PROVIDER_SITE_OTHER): Payer: Medicare Other | Admitting: Internal Medicine

## 2015-01-16 ENCOUNTER — Encounter: Payer: Self-pay | Admitting: Internal Medicine

## 2015-01-16 ENCOUNTER — Ambulatory Visit (INDEPENDENT_AMBULATORY_CARE_PROVIDER_SITE_OTHER): Payer: Medicare Other | Admitting: Dietician

## 2015-01-16 VITALS — Wt 211.3 lb

## 2015-01-16 VITALS — BP 133/58 | HR 73 | Temp 98.0°F | Wt 213.9 lb

## 2015-01-16 DIAGNOSIS — Z7982 Long term (current) use of aspirin: Secondary | ICD-10-CM | POA: Diagnosis not present

## 2015-01-16 DIAGNOSIS — I872 Venous insufficiency (chronic) (peripheral): Secondary | ICD-10-CM

## 2015-01-16 DIAGNOSIS — M5430 Sciatica, unspecified side: Secondary | ICD-10-CM

## 2015-01-16 DIAGNOSIS — I25119 Atherosclerotic heart disease of native coronary artery with unspecified angina pectoris: Secondary | ICD-10-CM

## 2015-01-16 DIAGNOSIS — Z713 Dietary counseling and surveillance: Secondary | ICD-10-CM | POA: Diagnosis not present

## 2015-01-16 DIAGNOSIS — I2510 Atherosclerotic heart disease of native coronary artery without angina pectoris: Secondary | ICD-10-CM | POA: Diagnosis not present

## 2015-01-16 DIAGNOSIS — Z794 Long term (current) use of insulin: Secondary | ICD-10-CM

## 2015-01-16 DIAGNOSIS — E1140 Type 2 diabetes mellitus with diabetic neuropathy, unspecified: Secondary | ICD-10-CM

## 2015-01-16 DIAGNOSIS — D509 Iron deficiency anemia, unspecified: Secondary | ICD-10-CM | POA: Diagnosis not present

## 2015-01-16 DIAGNOSIS — E669 Obesity, unspecified: Secondary | ICD-10-CM

## 2015-01-16 DIAGNOSIS — E119 Type 2 diabetes mellitus without complications: Secondary | ICD-10-CM

## 2015-01-16 DIAGNOSIS — Z79899 Other long term (current) drug therapy: Secondary | ICD-10-CM

## 2015-01-16 DIAGNOSIS — I10 Essential (primary) hypertension: Secondary | ICD-10-CM

## 2015-01-16 DIAGNOSIS — E785 Hyperlipidemia, unspecified: Secondary | ICD-10-CM

## 2015-01-16 DIAGNOSIS — E1165 Type 2 diabetes mellitus with hyperglycemia: Secondary | ICD-10-CM

## 2015-01-16 DIAGNOSIS — G8929 Other chronic pain: Secondary | ICD-10-CM

## 2015-01-16 LAB — POCT GLYCOSYLATED HEMOGLOBIN (HGB A1C): Hemoglobin A1C: 7.1

## 2015-01-16 LAB — GLUCOSE, CAPILLARY: Glucose-Capillary: 155 mg/dL — ABNORMAL HIGH (ref 70–99)

## 2015-01-16 NOTE — Patient Instructions (Signed)
1. Try warm compresses for 20 min 4 times a day for the spot on your side 2. Call 911 if have to take 2 or 3 nitroglycerin 3. Great job on your diabetes

## 2015-01-17 ENCOUNTER — Encounter: Payer: Self-pay | Admitting: Dietician

## 2015-01-17 MED ORDER — CLONIDINE HCL 0.2 MG PO TABS: 0.2000 mg | ORAL_TABLET | Freq: Two times a day (BID) | ORAL | Status: DC

## 2015-01-17 NOTE — Assessment & Plan Note (Signed)
BP Readings from Last 3 Encounters:  01/16/15 133/58  12/16/14 140/76  10/17/14 149/75   She has good control on her Norvasc 10, clonidine 0.2 BID (discovered med wrong in EPIC and corrected), lasix 20 QD, Losartan 50 QD, toprol 100, and her NTG patch. Cont current meds.

## 2015-01-17 NOTE — Assessment & Plan Note (Signed)
On BB, ASA, statin, and NTG (SL and patch). Just saw Dr Stanford Breed. Gets L upper arm and shoulder stabbing pain when walks that goes away when she rests. Also wakes up at night smothered and has to sit up and it goes away. Takes NTG while walking or at night with these sxs and sxs go away in 1-2 min. Has taken 2 but never 3. Dr C knows (per his note and per pt) and for now, RF mgmt and sxs tx. Per pt, if cont, would need further W/U. Pt to come to ED if requires 2 or 3 NTG SL.

## 2015-01-17 NOTE — Progress Notes (Signed)
Health Coaching Notes Visit 19; weight loss 11# since 02/21/14.  Appt start time: 9:30 end time:  1000. Weight 211.3# today. First group session for [name] today. Complains of leg swelling. Reports she did keep track of sodium and it is okay when she eat sin and higher when she eats out. Adah Perl on stationary 3 x a day for few minutes each time- Meter download: averages: 90 day:136, A1C improved to 7.1%  Diabetes medicine: insulin 70/30 taking 28 units in AM and 18  Before dinner in PM. .  Patient Identified Concern: : Weight management and Blood sugar control.  Stage of Change Patient Is In:  Contemplative to ready in regards to  decreased sodium/fast foods, Change in progress in other areas Patient Reported Barriers: Pain in right hip/leg that prevents her from being more active, balancing priorities between controlling blood sugar and weight loss.  Patient Reported Perceived Benefits:  Feel better about herself, better health, less stress on joints Patient Reports Self-Efficacy:  Improving Behavior Change Supports:  Daughter, CDE, doctor, group visit today Goals:  Weight < 200# eventually 160s, a1C < 7% Patient Education: Keep trying, ideas for lower sodium snacks  Monitoring/Evaluation:  Dietary intake, exercise, meter, and reevaluation of goals, body weight in 2 week(s).

## 2015-01-17 NOTE — Assessment & Plan Note (Signed)
Ferritin is now up to 52. HgB 12.1 Unable to tolerate oral iron 2/2 GI SE. We discussed IV iron and she would like to cont diet and follow.

## 2015-01-17 NOTE — Assessment & Plan Note (Signed)
Cont to have LE edema. No skin breakdown.

## 2015-01-17 NOTE — Assessment & Plan Note (Signed)
She is on atorvastatin 80 and LDL is increasing - 103 - 137 - 142 - despite increasing doses of statin. [name] is now convinced that the statin is the sole reason for her pain and myalgias. I doubt this as I feel her pain is multifactorial/ We agreed that she would stop her statin for 3 weeks to see if her pain resolves. She is to call us in 3 weeks to tell us about her pain. She understands need for statin and risks of stopping it for 3 weeks.

## 2015-01-17 NOTE — Assessment & Plan Note (Signed)
Wt Readings from Last 3 Encounters:  01/16/15 213 lb 14.4 oz (97.024 kg)  01/02/15 211 lb 12.8 oz (96.072 kg)  12/23/14 214 lb 6.4 oz (97.251 kg)   Weight overall is down from max 225 to 213 by working with [name].

## 2015-01-17 NOTE — Progress Notes (Signed)
   Subjective:    Patient ID: [name], female    DOB: Apr 18, 1951, 64 y.o.   MRN: [id number]  HPI  [name] is here for her 3 month F/U for her chronic medical issues inc DM. Please see the A&P for the status of the pt's chronic medical problems.   Review of Systems  Constitutional: Negative for unexpected weight change.  Respiratory: Positive for shortness of breath.   Cardiovascular: Positive for chest pain, palpitations and leg swelling.       Wakes up feeling smothered  Musculoskeletal: Positive for myalgias.  Skin: Positive for wound.  Psychiatric/Behavioral: Positive for sleep disturbance.       Objective:   Physical Exam  Constitutional: She is oriented to person, place, and time. She appears well-developed and well-nourished. No distress.  HENT:  Head: Normocephalic and atraumatic.  Right Ear: External ear normal.  Left Ear: External ear normal.  Nose: Nose normal.  Eyes: Conjunctivae and EOM are normal. Right eye exhibits no discharge. Left eye exhibits no discharge. No scleral icterus.  Musculoskeletal: She exhibits edema.  +1 edema LE B. Has cane otherwise walks independently.  Neurological: She is alert and oriented to person, place, and time.  Skin: Skin is warm and dry. She is not diaphoretic.  Under scar R lateral thoracic there is scar tissue. No abscess. No erythema / warmth. No drainage.   Psychiatric: She has a normal mood and affect. Her behavior is normal. Judgment and thought content normal.          Assessment & Plan:

## 2015-01-17 NOTE — Assessment & Plan Note (Signed)
Encouraged her to return to her pain clinic for steroid shot consideration of her sciatica.

## 2015-01-17 NOTE — Assessment & Plan Note (Signed)
Lab Results  Component Value Date   HGBA1C 7.1 01/16/2015     A1C down from 7.7 to 7.1. Pt is disappointed bc not 6. I explained this is a good result and she should be proud. Using metformin 500 BID and 70/30 28 in AM and 18 in PM. Working with [name] and having good results along with slow weight loss (did not have weight today though). UTD on all parameters.

## 2015-01-23 ENCOUNTER — Other Ambulatory Visit: Payer: Self-pay | Admitting: *Deleted

## 2015-01-23 DIAGNOSIS — I10 Essential (primary) hypertension: Secondary | ICD-10-CM

## 2015-01-23 DIAGNOSIS — E1140 Type 2 diabetes mellitus with diabetic neuropathy, unspecified: Secondary | ICD-10-CM

## 2015-01-23 NOTE — Telephone Encounter (Signed)
Pt insurance changing pharm, needs 90 days

## 2015-01-24 NOTE — Telephone Encounter (Signed)
All of these meds were sent earlier this month to [facility] order for 90 day supply.

## 2015-01-30 ENCOUNTER — Encounter: Payer: Self-pay | Admitting: Dietician

## 2015-01-30 ENCOUNTER — Ambulatory Visit (INDEPENDENT_AMBULATORY_CARE_PROVIDER_SITE_OTHER): Payer: Medicare Other | Admitting: Dietician

## 2015-01-30 VITALS — Wt 213.0 lb

## 2015-01-30 DIAGNOSIS — Z713 Dietary counseling and surveillance: Secondary | ICD-10-CM | POA: Diagnosis not present

## 2015-01-30 DIAGNOSIS — E119 Type 2 diabetes mellitus without complications: Secondary | ICD-10-CM | POA: Diagnosis not present

## 2015-01-30 DIAGNOSIS — E1140 Type 2 diabetes mellitus with diabetic neuropathy, unspecified: Secondary | ICD-10-CM

## 2015-01-30 DIAGNOSIS — Z6841 Body Mass Index (BMI) 40.0 and over, adult: Secondary | ICD-10-CM

## 2015-01-30 DIAGNOSIS — Z794 Long term (current) use of insulin: Secondary | ICD-10-CM | POA: Diagnosis not present

## 2015-01-30 NOTE — Progress Notes (Signed)
Medical Nutrition Therapy:  Appt start time: 0930 end time:  1000.  Assessment:  Primary concerns today: Weight management and Blood sugar control.  Weight 213# today.; weight loss 12# since 02/2014. She states she is fine with her very slow weight loss. Complains of leg swelling.Resigned to swelling because was told her medicine is causing it. Taking lasix ~ 1 x/week. Bikes on stationary 3 x a day for few minutes each time- Meter download: averages: 30 day:133, no lows, range 83-272, 92% of CBGs<180, highest readings between 5 Pm and 9 PM Diabetes medicine: insulin 70/30 taking 28 units in AM and 18  Before dinner in PM.She asked why metformin dose was decreased .  Patient Identified Concern: : Weight management and Blood sugar control.  Stage Patient Is In:  Contemplative to ready in regards to  decreased sodium/fast foods, Change in progress in other areas Patient Reported Barriers: Pain in right hip/leg "draws" that prevents her from being more active. .  Patient Reported Perceived Benefits:  Feel better about herself, better health, less stress on joints Patient Reports Self-Efficacy:  Improving Behavior Change Supports: Family, CDE, doctor, likes group visits  24-hr recall: Protein shakes, eggs,  fruit, vegetables, lean meats  Recent physical activity includes walks daily and rides bike.  Progress Towards Goal(s):  Some progress.   Nutritional Diagnosis:  [place]-3.3 Overweight/obesity As related to long term imbalacne between energy intake and output.  As evidenced by her BMI of 40.    Intervention:  Nutrition Education: Meal planning for diabetes, using low sodium soy sauce to lower sodium intake  Teaching Method Utilized: Visual, Auditory Handouts given during visit include: AVS Demonstrated degree of understanding via:  Teach Back  Goals:  Weight < 200# eventually 160s, a1C < 7%  Monitoring/Evaluation:  Dietary intake, exercise, meter, and reevaluation of goals, body weight in 2  week(s).

## 2015-01-30 NOTE — Patient Instructions (Addendum)
Your plan for the next 2 weeks to see if you can lower your weight again:  Decrease your evening insulin to 16 units  I will ask Dr. Lynnae January about your metformin dose.   (Good catch!)

## 2015-02-13 ENCOUNTER — Ambulatory Visit: Payer: Medicare Other | Admitting: Dietician

## 2015-03-10 ENCOUNTER — Encounter: Payer: Self-pay | Admitting: Internal Medicine

## 2015-03-10 ENCOUNTER — Ambulatory Visit (INDEPENDENT_AMBULATORY_CARE_PROVIDER_SITE_OTHER): Payer: Medicare Other | Admitting: Internal Medicine

## 2015-03-10 VITALS — BP 133/69 | HR 59 | Temp 98.3°F | Ht 61.0 in | Wt 210.3 lb

## 2015-03-10 DIAGNOSIS — Z7982 Long term (current) use of aspirin: Secondary | ICD-10-CM | POA: Diagnosis not present

## 2015-03-10 DIAGNOSIS — R300 Dysuria: Secondary | ICD-10-CM

## 2015-03-10 DIAGNOSIS — W19XXXA Unspecified fall, initial encounter: Secondary | ICD-10-CM

## 2015-03-10 DIAGNOSIS — Z9181 History of falling: Secondary | ICD-10-CM | POA: Diagnosis not present

## 2015-03-10 LAB — POCT URINALYSIS DIPSTICK
BILIRUBIN UA: NEGATIVE
Glucose, UA: NEGATIVE
Ketones, UA: NEGATIVE
Nitrite, UA: NEGATIVE
PH UA: 5
PROTEIN UA: NEGATIVE
RBC UA: NEGATIVE
Spec Grav, UA: <=1.005
UROBILINOGEN UA: 0.2

## 2015-03-10 NOTE — Assessment & Plan Note (Signed)
Patient had a fall while walking without her cane 3 weeks ago. She thinks she may have blacked out at some point during the fall. However, she denies chest pain, palpitations, headache, shaking, blurred vision, diaphoresis or any residual symptoms. This has not happened again. Her finger stick the morning of the fall was 112 (about 3 hours prior). Her last echo was in 01/2012 and showed grade 1 diastolic dysfunction and an EF of 55-60%. It is most likely that her fall was mechanical, though neurologic, orthostatic or cardiac causes cannot be eliminated this far from the event.  - Reassurance - Use cane when walking - Come to ED if this happens again - Will investigate for UTI, infection could have contributed to an unsteadiness - RTC for appointment with Dr. Lynnae January (PCP) within the month

## 2015-03-10 NOTE — Patient Instructions (Addendum)
Please continue to drink plenty of water.   If your urine test comes back showing an infection, I will call you and prescribe antibiotics for you.  Use your cane when you are walking.   If you have another episode of blacking out, please call 911!   General Instructions:   Please bring your medicines with you each time you come to clinic.  Medicines may include prescription medications, over-the-counter medications, herbal remedies, eye drops, vitamins, or other pills.   Progress Toward Treatment Goals:  Treatment Goal 06/06/2014  Hemoglobin A1C improved  Blood pressure at goal  Prevent falls -    Self Care Goals & Plans:  Self Care Goal 06/06/2014  Manage my medications take my medicines as prescribed  Monitor my health check my feet daily; keep track of my blood glucose; bring my glucose meter and log to each visit  Eat healthy foods eat foods that are low in salt; eat baked foods instead of fried foods; drink diet soda or water instead of juice or soda  Be physically active find an activity I enjoy  Prevent falls -  Meeting treatment goals -    Home Blood Glucose Monitoring 06/06/2014  Check my blood sugar 2 times a day  When to check my blood sugar before breakfast; before dinner     Care Management & Community Referrals:  Referral 06/06/2014  Referrals made for care management support none needed  Referrals made to community resources -

## 2015-03-10 NOTE — Assessment & Plan Note (Addendum)
Patient has been experiencing burning with urination and occasional shooting pain in her lower abdomen since her recent fall (3 weeks ago). She has not noticed any hematuria, chills, fever, nausea or vomiting. No other risks for urinary tract infection.  - Urine dip  Addendum: urine negative  Called patient to inform of these results, but no voicemail set up

## 2015-03-10 NOTE — Progress Notes (Signed)
[name] Subjective:   Patient ID: [name] female   DOB: April 01, 1951 64 y.o.   MRN: [id number]  HPI: [name] is a 64 y.o. female with a history of hypertension, hyperlipidemia, DM2, diastolic CHF, breast cancer (s/p lumpectomy and radiation) and OSA who presents for evaluation of some recent unsteadiness.  About 3 weeks ago, she got out her daughter's car without her cane, took several steps, then fell forward, hitting her stomach on the pavement.  She thinks she may have blacked out during the fall. Since that time, she has experienced burning when urinating and some shooting pain in her lower abdomen. She also has had some nausea, felt dizzy, and consistently been cold. She denies any chest pain, headache, diaphoresis, shaking or other new symptoms during the fall or since the fall.   She has had similar "blackouts" in the past when she was hypoglycemic, but she does not believe her sugar was low during this episode.    Past Medical History  Diagnosis Date  . Chest pain 11/07    cath 11/07 : LAD luminal irregularities, ramus intermediate with superior branch 70% stenosis, circumflex 50% stenosis, marginal 50% stenosis, well preserved ejection fraction. Stress test Jan 2013 : suggestion of lateral wall ischemia, likely artifact  . Hypertension     Requires 5 drug therapy and still difficult to control.  Marland Kitchen GERD (gastroesophageal reflux disease)   . Hyperlipidemia     On daily statin  . Obesity     Morbid. HAs worked with [name] T previously.  . Colonic polyp 1995    Colonoscopies 1994, 2000, and 02/02/2011. Last had 9 polyps - Tubular adenoma and tubulovillous was the pathology results without high grade dysplasia  . Chronic pain     MR 08/07/10 :  Spondylosis L4-5 with B foraminal narrowing and L4 nerve root enchroachment B.  . Seasonal allergies     seasonal  . Iron deficiency anemia     Ferritin was 12 in 2012. on FeSo4. Has had colon and EGD  02/02/11 that showed mild gastritis and colon polyps.  . Chronic venous insufficiency 2012    Has had full W/U incl ECHO, LFT's, Cr, Umicro, TSH, BNP. Symptomatic treatment.  . Diastolic CHF, chronic     NL EF by echo 01/2012, Gr I dd  . Arthritis     knees  . 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 S/p lumpectomy and radiation.  . Diabetes mellitus type II     Insulin dependent. Has worked with [name] R previously.  . OSA (obstructive sleep apnea) 02/16/2007    02/2007 : Moderate AHI 35.6, O2 sat decreased to 65%, CPAP titration to 16 with AHI of 3.6. 02/2014 : Rare respiratory events with sleep disturbance, within normal limits. AHI 0.4 per hour       Current Outpatient Prescriptions  Medication Sig Dispense Refill  . ACCU-CHEK FASTCLIX LANCETS MISC USE ONE LANCET THREE TIMES DAILY BEFORE MEALS. DX CODE: E11.40 102 each 11  . amLODipine (NORVASC) 10 MG tablet TAKE ONE (1) TABLET BY MOUTH EVERY DAY 90 tablet 3  . aspirin EC 81 MG tablet Take 81 mg by mouth daily.    Marland Kitchen atorvastatin (LIPITOR) 80 MG tablet TAKE ONE TABLET BY MOUTH EVERY DAY 90 tablet 3  . Bilberry, Vaccinium myrtillus, (BILBERRY PO) Take 1,000 mg by mouth daily.      . Calcium-Magnesium-Vitamin D (CALCIUM MAGNESIUM PO)  Take 1 tablet by mouth daily.    . cimetidine (TAGAMET) 200 MG tablet Take 200 mg by mouth 2 (two) times daily.    . cloNIDine (CATAPRES) 0.2 MG tablet Take 1 tablet (0.2 mg total) by mouth 2 (two) times daily. 180 tablet 3  . docusate sodium (COLACE) 100 MG capsule Take 100 mg by mouth 2 (two) times daily.    . furosemide (LASIX) 20 MG tablet Take 1 tablet (20 mg total) by mouth daily as needed. 30 tablet 2  . gabapentin (NEURONTIN) 300 MG capsule TAKE ONE (1) CAPSULE BY MOUTH 2 TIMES DAILY 180 capsule 3  . glucose 4 GM chewable tablet Chew 4 tablets (16 g total) by mouth as needed for low blood sugar. 50 tablet 12  . glucose blood (ACCU-CHEK SMARTVIEW) test strip Check  blood sugar up to 3 times daily  Dx code 250.00. Insulin dependent 100 each 11  . ibuprofen (ADVIL,MOTRIN) 800 MG tablet Take 1 tablet (800 mg total) by mouth every 8 (eight) hours as needed. For pain 90 tablet 0  . Insulin Isophane & Regular Human (HUMULIN 70/30 MIX) (70-30) 100 UNIT/ML PEN Inject 28 units before morning meal and 15 units before evening meal. 15 mL 11  . Insulin Syringe-Needle U-100 31G X 15/64" 0.5 ML MISC 1 applicator by Does not apply route 2 (two) times daily. Dx code E11.40 100 each 12  . IRON PO Take 27 mg by mouth daily.    Marland Kitchen loratadine (CLARITIN) 10 MG tablet TAKE ONE TABLET BY MOUTH ONCE DAILY FOR ALLERGIES 90 tablet 3  . losartan (COZAAR) 50 MG tablet TAKE ONE (1) TABLET BY MOUTH EVERY DAY 90 tablet 3  . metFORMIN (GLUCOPHAGE) 500 MG tablet TAKE ONE (1) TABLET BY MOUTH TWO (2) TIMES DAILY WITH A MEAL 180 tablet 3  . metoprolol succinate (TOPROL-XL) 100 MG 24 hr tablet Take 1 tablet (100 mg total) by mouth daily. 90 tablet 3  . mometasone (NASONEX) 50 MCG/ACT nasal spray Place 2 sprays into the nose daily. 17 g 11  . Multiple Vitamins-Minerals (WOMENS ONE DAILY) TABS Take 1 tablet by mouth daily.    . nitroGLYCERIN (NITRODUR - DOSED IN MG/24 HR) 0.4 mg/hr patch Place 1 patch (0.4 mg total) onto the skin daily. Leave on for 12 hours then remove for 12 hours before placing new patch. 90 patch 3  . nitroGLYCERIN (NITROSTAT) 0.4 MG SL tablet Place 1 tablet (0.4 mg total) under the tongue every 5 (five) minutes as needed for chest pain. 25 tablet 3  . Nutritional Supplements (GRAPESEED EXTRACT PO) Take 650 mg by mouth daily.    . Olopatadine HCl (PATADAY) 0.2 % SOLN Place 1 drop into both eyes daily as needed. For allergies    . polyethylene glycol powder (GLYCOLAX/MIRALAX) powder Take 17 g by mouth daily as needed. For constipation    . Potassium 99 MG TABS Take 99 mg by mouth daily.    Marland Kitchen senna (SENOKOT) 8.6 MG TABS tablet Take 2 tablets (17.2 mg total) by mouth daily. 60 each  2  . traMADol (ULTRAM) 50 MG tablet Take 1 tablet (50 mg total) by mouth every 6 (six) hours as needed. 60 tablet 2  . Turmeric, Curcuma Longa, POWD 1 scoop by Does not apply route daily.     No current facility-administered medications for this visit.   Family History  Problem Relation Age of Onset  . Depression Daughter 16  . Osteoarthritis Maternal Grandmother   . Lung cancer  Mother   . Diabetes Father   . Breast cancer Sister   . Pulmonary embolism Brother   . Colon cancer Neg Hx   . Heart attack Brother 8   History   Social History  . Marital Status: Divorced    Spouse Name: N/A  . Number of Children: N/A  . Years of Education: 79   Social History Main Topics  . Smoking status: Never Smoker   . Smokeless tobacco: Never Used  . Alcohol Use: No  . Drug Use: No  . Sexual Activity: Not on file   Other Topics Concern  . None   Social History Narrative   Worked at [employer] part-time from [phone], 2009-2011. No longer working 2015.Associates degree in early childhood education. Single, lives by self.   Review of Systems: General: no recent illness Skin: no rashes or lesions HEENT: occasional headaches, no blurred vision Cardiac: no chest pain or palpitations Respiratory: no shortness of breath GI: no abdominal pain or changes in BMs, no nausea or vomiting Urinary: + dysuria, no hematuria Msk: pain in lower abdomen following fall (at point of contact) Psychiatric: no depressed mood  Objective:  Physical Exam: Filed Vitals:   03/10/15 1428  BP: 133/69  Pulse: 59  Temp: 98.3 F (36.8 C)  TempSrc: Oral  Height: 5\' 1"  (1.549 m)  Weight: 210 lb 4.8 oz (95.391 kg)  SpO2: 100%   Appearance: in NAD, in room holding cane HEENT: AT/[place], PERRL, EOMi Heart: RRR, normal S1S2 Lungs: CTAB, no wheezes, normal work of breathing Abdomen: BS+, soft, nontender Musculoskeletal: normal range of motion, no edema Neurologic: A&Ox3, grossly intact Skin: no rashes  or lesions  Assessment & Plan:  Case discussed with Dr. Lynnae January  Dysuria Patient has been experiencing burning with urination and occasional shooting pain in her lower abdomen since her recent fall (3 weeks ago). She has not noticed any hematuria, chills, fever, nausea or vomiting. No other risks for urinary tract infection.  - Urine dip   Fall Patient had a fall while walking without her cane 3 weeks ago. She thinks she may have blacked out at some point during the fall. However, she denies chest pain, palpitations, headache, shaking, blurred vision, diaphoresis or any residual symptoms. This has not happened again. Her finger stick the morning of the fall was 112 (about 3 hours prior). Her last echo was in 01/2012 and showed grade 1 diastolic dysfunction and an EF of 55-60%. It is most likely that her fall was mechanical, though neurologic, orthostatic or cardiac causes cannot be eliminated this far from the event.  - Reassurance - Use cane when walking - Come to ED if this happens again - Will investigate for UTI, infection could have contributed to an unsteadiness - RTC for appointment with Dr. Lynnae January (PCP) within the month      Medications Ordered No orders of the defined types were placed in this encounter.   Other Orders Orders Placed This Encounter  Procedures  . POCT Urinalysis Dipstick (81002)

## 2015-03-11 NOTE — Progress Notes (Signed)
Internal Medicine Clinic Attending  Case discussed with Dr. Mallory at the time of the visit.  We reviewed the resident's history and exam and pertinent patient test results.  I agree with the assessment, diagnosis, and plan of care documented in the resident's note. 

## 2015-03-17 DIAGNOSIS — H1045 Other chronic allergic conjunctivitis: Secondary | ICD-10-CM | POA: Diagnosis not present

## 2015-03-17 DIAGNOSIS — H40053 Ocular hypertension, bilateral: Secondary | ICD-10-CM | POA: Diagnosis not present

## 2015-03-17 DIAGNOSIS — H11153 Pinguecula, bilateral: Secondary | ICD-10-CM | POA: Diagnosis not present

## 2015-03-17 DIAGNOSIS — H3589 Other specified retinal disorders: Secondary | ICD-10-CM | POA: Diagnosis not present

## 2015-03-17 DIAGNOSIS — H40013 Open angle with borderline findings, low risk, bilateral: Secondary | ICD-10-CM | POA: Diagnosis not present

## 2015-03-17 DIAGNOSIS — H18413 Arcus senilis, bilateral: Secondary | ICD-10-CM | POA: Diagnosis not present

## 2015-03-17 DIAGNOSIS — H25013 Cortical age-related cataract, bilateral: Secondary | ICD-10-CM | POA: Diagnosis not present

## 2015-03-17 DIAGNOSIS — H2513 Age-related nuclear cataract, bilateral: Secondary | ICD-10-CM | POA: Diagnosis not present

## 2015-03-17 DIAGNOSIS — H35372 Puckering of macula, left eye: Secondary | ICD-10-CM | POA: Diagnosis not present

## 2015-03-17 LAB — HM DIABETES EYE EXAM

## 2015-03-20 ENCOUNTER — Encounter: Payer: Self-pay | Admitting: *Deleted

## 2015-03-26 ENCOUNTER — Encounter: Payer: Self-pay | Admitting: Internal Medicine

## 2015-03-26 DIAGNOSIS — E11319 Type 2 diabetes mellitus with unspecified diabetic retinopathy without macular edema: Secondary | ICD-10-CM | POA: Insufficient documentation

## 2015-03-27 ENCOUNTER — Ambulatory Visit (INDEPENDENT_AMBULATORY_CARE_PROVIDER_SITE_OTHER): Payer: Medicare Other | Admitting: Internal Medicine

## 2015-03-27 VITALS — BP 124/67 | HR 67 | Temp 97.9°F | Ht 61.0 in | Wt 211.3 lb

## 2015-03-27 DIAGNOSIS — E1140 Type 2 diabetes mellitus with diabetic neuropathy, unspecified: Secondary | ICD-10-CM | POA: Diagnosis present

## 2015-03-27 DIAGNOSIS — I10 Essential (primary) hypertension: Secondary | ICD-10-CM

## 2015-03-27 DIAGNOSIS — I872 Venous insufficiency (chronic) (peripheral): Secondary | ICD-10-CM

## 2015-03-27 DIAGNOSIS — Z794 Long term (current) use of insulin: Secondary | ICD-10-CM | POA: Diagnosis not present

## 2015-03-27 DIAGNOSIS — E669 Obesity, unspecified: Secondary | ICD-10-CM

## 2015-03-27 DIAGNOSIS — Z79899 Other long term (current) drug therapy: Secondary | ICD-10-CM | POA: Diagnosis not present

## 2015-03-27 DIAGNOSIS — E785 Hyperlipidemia, unspecified: Secondary | ICD-10-CM | POA: Diagnosis not present

## 2015-03-27 DIAGNOSIS — Z6839 Body mass index (BMI) 39.0-39.9, adult: Secondary | ICD-10-CM

## 2015-03-27 DIAGNOSIS — M722 Plantar fascial fibromatosis: Secondary | ICD-10-CM

## 2015-03-27 MED ORDER — FUROSEMIDE 20 MG PO TABS: 20.0000 mg | ORAL_TABLET | Freq: Every day | ORAL | Status: DC | PRN

## 2015-03-27 NOTE — Assessment & Plan Note (Signed)
On metformin 500 BID and 70/30 28 units in AM and 15 in PM. CBG per meter download great - only 8% above goal. No low sxs. Cont current meds.  Lab Results  Component Value Date   HGBA1C 7.1 01/16/2015

## 2015-03-27 NOTE — Assessment & Plan Note (Signed)
Very slow but steady downtrend in weight while working with [name]. 225 to 211 over a bit over 1 yr.  Wt Readings from Last 3 Encounters:  03/27/15 211 lb 4.8 oz (95.845 kg)  03/10/15 210 lb 4.8 oz (95.391 kg)  01/30/15 213 lb (96.616 kg)

## 2015-03-27 NOTE — Assessment & Plan Note (Signed)
She thought her arthralgias and myalgias were 2/2 to her statin and we agreed that she would stop it. It took 4 weeks for the sxs to resolve. I thought this was out of time course to be attributable to a statin but after review, it can take some time for sxs to resolve. She is agreeable to a rechallenge - her risk is >20% for CHD event in 10 yrs therefore, she really needs a high potency statin and I really want reassurance that it is the statin prior to adding an intolerance to the whole class.  ASSESSMENT : uncontrolled hyperlipidemia, secondary prevention, > 20% 10 yr risk  PLAN: Rechallenge with statin CK next appt Stop statin if sxs recur.

## 2015-03-27 NOTE — Progress Notes (Signed)
   Subjective:    Patient ID: [name], female    DOB: 08-18-1951, 64 y.o.   MRN: [id number]  HPI  [name] is here for DM F/U. Please see the A&P for the status of the pt's chronic medical problems.   Review of Systems  Constitutional: Negative for unexpected weight change.  HENT: Negative for rhinorrhea and sneezing.   Eyes: Positive for visual disturbance. Negative for itching.  Respiratory: Positive for shortness of breath.   Cardiovascular: Positive for chest pain and leg swelling.  Gastrointestinal: Negative for abdominal pain.  Musculoskeletal: Negative for arthralgias.       L foot pain  Neurological: Positive for dizziness and light-headedness.  Psychiatric/Behavioral: Negative for sleep disturbance.       Objective:   Physical Exam  Constitutional: She is oriented to person, place, and time. She appears well-developed and well-nourished. No distress.  HENT:  Head: Normocephalic and atraumatic.  Right Ear: External ear normal.  Left Ear: External ear normal.  Nose: Nose normal.  Eyes: Conjunctivae and EOM are normal.  Cardiovascular: Normal rate, regular rhythm and normal heart sounds.   Pulmonary/Chest: Effort normal and breath sounds normal.  Musculoskeletal: Normal range of motion. She exhibits edema.  Tenderness of L plantar fascia. No skin breakdown.  Neurological: She is oriented to person, place, and time.  Skin: Skin is warm. She is not diaphoretic.  Psychiatric: She has a normal mood and affect. Her behavior is normal. Judgment and thought content normal.          Assessment & Plan:

## 2015-03-27 NOTE — Assessment & Plan Note (Addendum)
BP Readings from Last 3 Encounters:  03/27/15 124/67  03/10/15 133/69  01/16/15 133/58    BP is at goal on norvasc 10, clonidine 0.2 BID, lasix 20 PRN edema, losartan 50, toprol 100, NTG patch. She was orthostatic today, SBP dropping from 145 to 124 sitting to standing.   She describes sxs which start with room spinning to loosing balance to blackout spots in her vision. She once lost total vision at bank - see Dr Sandi Raveling note. The whole spectrum of sxs last few seconds, less than 10 seconds, and usually then back to nl. Occassionally lasts couple minutes and sits or lays down. On repeated questioning, this only occurs when sitting from laying or standing up from sitting. She is orthostatic. This could be 2/2 meds or from autonomic insuff. Pt states Dr Percival Spanish planned to take her off her norvasc if her LE edema cont although I cannot find this in his note.   ASSESSMENT : well controlled HTN with symptomatic and freq orthostatic hypotension  PLAN 1. She is to check CBG during nexet few events 2. Stop norvasc - as vasodilator could be cause. 3. Return in 2 - 3 weeks for reassessment.

## 2015-03-27 NOTE — Assessment & Plan Note (Signed)
lasix 20 PRN. Med refilled. May need to use caution it orthostatic hypotension continues.

## 2015-03-27 NOTE — Patient Instructions (Signed)
1. I am sending you to sports medicine for your left heel pain. Try the exercises for the pain. Try ibuprofen (take with food). Also ice it for 20 min 4 times a day

## 2015-03-31 ENCOUNTER — Other Ambulatory Visit: Payer: Self-pay | Admitting: *Deleted

## 2015-03-31 DIAGNOSIS — E1140 Type 2 diabetes mellitus with diabetic neuropathy, unspecified: Secondary | ICD-10-CM

## 2015-03-31 MED ORDER — GLUCOSE BLOOD VI STRP: ORAL_STRIP | Status: DC

## 2015-04-01 ENCOUNTER — Ambulatory Visit (INDEPENDENT_AMBULATORY_CARE_PROVIDER_SITE_OTHER): Payer: Medicare Other | Admitting: Dietician

## 2015-04-01 VITALS — Wt 212.5 lb

## 2015-04-01 DIAGNOSIS — Z713 Dietary counseling and surveillance: Secondary | ICD-10-CM

## 2015-04-01 DIAGNOSIS — E119 Type 2 diabetes mellitus without complications: Secondary | ICD-10-CM

## 2015-04-01 DIAGNOSIS — Z794 Long term (current) use of insulin: Secondary | ICD-10-CM | POA: Diagnosis not present

## 2015-04-01 DIAGNOSIS — Z6841 Body Mass Index (BMI) 40.0 and over, adult: Secondary | ICD-10-CM

## 2015-04-01 DIAGNOSIS — E1140 Type 2 diabetes mellitus with diabetic neuropathy, unspecified: Secondary | ICD-10-CM

## 2015-04-01 NOTE — Patient Instructions (Signed)
Measure your fruits to see how much you are eating in one day.   3 servings of fruit a day is enough, four servings a day is the limit.  Try to get more blood sugar readings to see if this is why you have these blackout spells: before lunch, before and after you exercise and before bedtime.    See you in two weeks!

## 2015-04-01 NOTE — Progress Notes (Signed)
Medical Nutrition Therapy:  Appt start time: 1010 end time:  7035.  Assessment:  Primary concerns today: Weight management and Blood sugar control.  Weight 212.5# today.; weight loss 12.5# since 02/2014. She states she is fine with her very slow weight loss.  Activity:  Bikes on stationary 3 x a day for few minutes each time-and walks 30-35 minutes almost every day Meter download: averages: 30 day:140, no lows on meter, but reports blackout spells are common ( >2x/week) for which she eats and then feels better, range 93-214, 92% of CBGs<180, highest readings between 5 Pm and 9 PM, note that fastig blood sugars hjigher in past 2 weeks than earlier in the month and weight increased as well- suspect she is defensively eating.  Diabetes medicine: insulin 70/30 taking 28 units in AM and 16  Before dinner in PM, told that her PM dose is 15 units today. Metformin 500 mg twice daily- decreased in 2014 and patient does not know why, she reports not problems with tolerance and creatinine with normal.  Patient Identified Concern: : Weight management and Blood sugar control.  Stage Patient Is In:  Contemplative to ready in regards to  decreased sodium/fast foods, Change in progress in other areas Patient Reported Barriers: Pain in right hip/leg "draws" that prevents her from being more active. Does not quantify foods- measure or keep detailed food logs despite this being suggested.  Patient Reported Perceived Benefits:  Feel better about herself, better health, less stress on joints Patient Reports Self-Efficacy:  Improving Behavior Change Supports: Family, CDE, doctor, likes group visits  24-hr recall: [employer], in cream sauce, coffee,with cream and sugar, then another cup of coffee, after meal CBG in office was 172, fasting was 159 today.  Progress Towards Goal(s):  Some progress.   Nutritional Diagnosis:  [place]-3.3 Overweight/obesity As related to long term imbalance between energy intake and output is very  slowing improving As evidenced by her BMI of 40.    Intervention:  Nutrition Education: Meal planning for diabetes, portion size awareness, increased self monitoring Coordination of care- Blood sugar very well controlled, weight increased, suspect undetected hypoglycemia and defensive eating:  consider increasing metformin back to 1000 mg twice daily and lowering insulin substantially to prevent hypoglycemia ( ~ 20-30%) or could consider switching back to a basal insulin ( taken off in 2012 due to expense) with januvia to decrease hypoglycemia risk.  Teaching Method Utilized: Visual, Auditory Handouts given during visit include: AVS Demonstrated degree of understanding via:  Teach Back  Goals:  Weight < 200# eventually 160s, a1C < 7%  Monitoring/Evaluation:  Dietary intake, exercise, meter, and reevaluation of goals, body weight in 2 week(s).

## 2015-04-04 ENCOUNTER — Other Ambulatory Visit: Payer: Self-pay | Admitting: Internal Medicine

## 2015-04-04 ENCOUNTER — Telehealth: Payer: Self-pay | Admitting: Dietician

## 2015-04-04 MED ORDER — METFORMIN HCL 1000 MG PO TABS: ORAL_TABLET | ORAL | Status: DC

## 2015-04-08 NOTE — Telephone Encounter (Signed)
Informed patient about new dose of metformin and to carry meter and glucose tablets with her for a few days after she starts it as it can lower her blood sugar more. She verbalized understanding.  She has been checking her blood sugar more because she had dizzy spells this weekend. When she checked her blood sugar, it was high.  Reiterated per patient request that her clonidine was twice a day. Patient will be here to see Dr. Lynnae January on Thursday

## 2015-04-10 ENCOUNTER — Encounter: Payer: Self-pay | Admitting: Internal Medicine

## 2015-04-10 ENCOUNTER — Ambulatory Visit (INDEPENDENT_AMBULATORY_CARE_PROVIDER_SITE_OTHER): Payer: Medicare Other | Admitting: Internal Medicine

## 2015-04-10 ENCOUNTER — Other Ambulatory Visit: Payer: Self-pay

## 2015-04-10 VITALS — BP 145/66 | HR 74 | Temp 98.8°F | Ht 61.0 in | Wt 211.0 lb

## 2015-04-10 DIAGNOSIS — Z794 Long term (current) use of insulin: Secondary | ICD-10-CM | POA: Diagnosis not present

## 2015-04-10 DIAGNOSIS — Z7982 Long term (current) use of aspirin: Secondary | ICD-10-CM | POA: Diagnosis not present

## 2015-04-10 DIAGNOSIS — I872 Venous insufficiency (chronic) (peripheral): Secondary | ICD-10-CM

## 2015-04-10 DIAGNOSIS — I10 Essential (primary) hypertension: Secondary | ICD-10-CM | POA: Diagnosis not present

## 2015-04-10 DIAGNOSIS — E1140 Type 2 diabetes mellitus with diabetic neuropathy, unspecified: Secondary | ICD-10-CM

## 2015-04-10 DIAGNOSIS — I2510 Atherosclerotic heart disease of native coronary artery without angina pectoris: Secondary | ICD-10-CM

## 2015-04-10 DIAGNOSIS — E785 Hyperlipidemia, unspecified: Secondary | ICD-10-CM

## 2015-04-10 DIAGNOSIS — Z79899 Other long term (current) drug therapy: Secondary | ICD-10-CM | POA: Diagnosis not present

## 2015-04-10 DIAGNOSIS — Z1231 Encounter for screening mammogram for malignant neoplasm of breast: Secondary | ICD-10-CM

## 2015-04-10 DIAGNOSIS — I25119 Atherosclerotic heart disease of native coronary artery with unspecified angina pectoris: Secondary | ICD-10-CM

## 2015-04-10 LAB — POCT GLYCOSYLATED HEMOGLOBIN (HGB A1C): Hemoglobin A1C: 7.4

## 2015-04-10 LAB — GLUCOSE, CAPILLARY: GLUCOSE-CAPILLARY: 135 mg/dL — AB (ref 65–99)

## 2015-04-10 NOTE — Assessment & Plan Note (Signed)
Has known CAD. On BB, statin, ASA, NTG patch. Cont to have intermittent chest pressure and needles and LU arm pain. Occurs any time - resting, walking, sitting. Takes NTG 3-4 times week. Encouraged to make cards appts.

## 2015-04-10 NOTE — Progress Notes (Signed)
   Subjective:    Patient ID: [name], female    DOB: 06-23-1951, 64 y.o.   MRN: [id number]  HPI  [name] is here for HTN F/U. Please see the A&P for the status of the pt's chronic medical problems.  Review of Systems  Constitutional: Negative for unexpected weight change.  Respiratory: Positive for shortness of breath.   Cardiovascular: Positive for chest pain and leg swelling.  Musculoskeletal: Positive for gait problem. Negative for myalgias and arthralgias.  Neurological: Positive for weakness.       Objective:   Physical Exam  Constitutional: She is oriented to person, place, and time. She appears well-developed and well-nourished. No distress.  HENT:  Head: Normocephalic and atraumatic.  Right Ear: External ear normal.  Left Ear: External ear normal.  Nose: Nose normal.  Eyes: Conjunctivae and EOM are normal.  Cardiovascular: Normal rate, regular rhythm and normal heart sounds.   Pulmonary/Chest: Effort normal.  Musculoskeletal: Normal range of motion. She exhibits edema.  Strength Knee extension 5/5 B, hip flexion 4+ R and 5/5 on L  Neurological: She is alert and oriented to person, place, and time.  Skin: Skin is warm and dry. She is not diaphoretic.  Psychiatric: She has a normal mood and affect. Her behavior is normal. Judgment and thought content normal.          Assessment & Plan:

## 2015-04-10 NOTE — Assessment & Plan Note (Signed)
Restarted the lipitor and no pain since restarting it. If she develops the pain, she is to call me and we will stop the statin.

## 2015-04-10 NOTE — Patient Instructions (Signed)
1. See me in 3 months 2. Pls make an appointment to see your heart doctor 3. Come to the ER if your chest pain doesn't go away with nitroglycerin pills 4. Please restart the amlodipine / norvasc

## 2015-04-10 NOTE — Assessment & Plan Note (Signed)
LE edema did not improve off the norvasc. Restart norvasc at 10 mg.

## 2015-04-11 NOTE — Assessment & Plan Note (Signed)
She remains on 70/30 - 28 units in the AM and 15 in the PM. She has so far tolerated the increase in her metformin to 1000 BID. She denies lows and checked her CBG during several of the dizzy spells and her CBG was not low.

## 2015-04-11 NOTE — Assessment & Plan Note (Signed)
At the last app, she was orthostatic and c/o dizzy spells. We stopped her norvasc. Since then, her BP has increased - both in office and at home. Additionally, her LE edema did not improve nor did the freq and severity of the dizzy spells. Therefore, she is adding back her norvasc.

## 2015-04-14 ENCOUNTER — Ambulatory Visit (INDEPENDENT_AMBULATORY_CARE_PROVIDER_SITE_OTHER): Payer: Medicare Other | Admitting: Family Medicine

## 2015-04-14 ENCOUNTER — Encounter: Payer: Self-pay | Admitting: Family Medicine

## 2015-04-14 VITALS — BP 137/78 | Ht 61.0 in | Wt 208.0 lb

## 2015-04-14 DIAGNOSIS — M25579 Pain in unspecified ankle and joints of unspecified foot: Secondary | ICD-10-CM | POA: Diagnosis not present

## 2015-04-14 DIAGNOSIS — I25119 Atherosclerotic heart disease of native coronary artery with unspecified angina pectoris: Secondary | ICD-10-CM

## 2015-04-14 NOTE — Assessment & Plan Note (Signed)
Left plantar fasciitis. We'll start her on scaphoid pad and sports insoles. Continue stretching and icing which we discussed with patient. Right ankle pain. Think this has more to do with a recent sprain and her antalgic gait. I put her on some exercises for the ankle and also placed supporting scaphoid pad and issue. See her back in one month.

## 2015-04-14 NOTE — Progress Notes (Signed)
Patient ID: [name], female   DOB: [birth year], 64 y.o.   MRN: [id number]  CHASSIE PENNIX - 64 y.o. female MRN [id number]  Date of birth: 01-May-1951    SUBJECTIVE:     Bilateral foot and ankle pain. Left foot pain is mostly on the plantar portion of her heel. There is a discrete area where it is tender. She will have pain with walking and at the end of the day as she has done or standing and walking. Has pain first step of the morning.  Right ankle pain is constant, worse with standing or walking. Radiates from the lateral malleolus around the front of the foot. She also notices some swelling.  No specific injury but she did have a fall a few weeks ago tripping over a curb. Does not think she injured her ankle at the time. ROS:     No unusual weight change, fever, sweats, chills. She has noted some lower extremity swelling which is typical for her but the right ankle seems to be swelling a little bit more than usual. She's had no other unusual joint swelling or joint pains.  PERTINENT  PMH / PSH FH / / SH:  Past Medical, Surgical, Social, and Family History Reviewed & Updated in the EMR.  Pertinent findings include:  Diabetes mellitus Obesity Obstructive sleep apnea Coronary artery disease Diabetic retinopathy Diabetic neuropathy Chronic pain syndrome   OBJECTIVE: BP 137/78 mmHg  Ht 5\' 1"  (1.549 m)  Wt 208 lb (94.348 kg)  BMI 39.32 kg/m2  LMP 07/12/1984  Physical Exam:  Vital signs are reviewed. GEN.: Well-developed overweight female no acute distress ANKLES: Bilaterally she has full range of motion in anterior drawer is symmetrical with a good intact in point. Left ankle inversion and eversion is painless. She has mild tenderness to palpation over the origin the plantar fascia. Right ankle inversion and eversion causes some reproduction of her lateral ankle pain. By fashion area is nontender to palpation. FEET: Bilateral pes planus. Some medial foot collapse right greater than  left during ambulation. GAIT: Antalgic. Walks with a cane. Wide-based short stride length. SKIN: Thickened callus bilateral plantar surfaces of the feet.  ASSESSMENT & PLAN:  See problem based charting & AVS for pt instructions.

## 2015-04-15 ENCOUNTER — Ambulatory Visit
Admission: RE | Admit: 2015-04-15 | Discharge: 2015-04-15 | Disposition: A | Discharge status: Home / Self Care | Payer: Medicare Other | Source: Ambulatory Visit

## 2015-04-15 DIAGNOSIS — Z1231 Encounter for screening mammogram for malignant neoplasm of breast: Secondary | ICD-10-CM

## 2015-04-17 ENCOUNTER — Other Ambulatory Visit: Payer: Self-pay | Admitting: Internal Medicine

## 2015-04-17 ENCOUNTER — Ambulatory Visit (INDEPENDENT_AMBULATORY_CARE_PROVIDER_SITE_OTHER): Payer: Medicare Other | Admitting: Dietician

## 2015-04-17 ENCOUNTER — Encounter: Payer: Self-pay | Admitting: Dietician

## 2015-04-17 VITALS — Wt 209.3 lb

## 2015-04-17 DIAGNOSIS — E119 Type 2 diabetes mellitus without complications: Secondary | ICD-10-CM

## 2015-04-17 DIAGNOSIS — Z713 Dietary counseling and surveillance: Secondary | ICD-10-CM

## 2015-04-17 DIAGNOSIS — Z6839 Body mass index (BMI) 39.0-39.9, adult: Secondary | ICD-10-CM

## 2015-04-17 DIAGNOSIS — E1140 Type 2 diabetes mellitus with diabetic neuropathy, unspecified: Secondary | ICD-10-CM

## 2015-04-17 MED ORDER — INSULIN ISOPHANE & REGULAR (HUMAN 70-30)100 UNIT/ML KWIKPEN: PEN_INJECTOR | SUBCUTANEOUS | Status: DC

## 2015-04-17 NOTE — Patient Instructions (Signed)
Your weight is 209 today- better!!!  Please decrease your morning insulin to 26 units and continue the 15 units in the evening.   Consider trying higher fiber whole grains- cookies, pasta- smaller portions.

## 2015-04-17 NOTE — Progress Notes (Signed)
Medical Nutrition Therapy:  Appt start time: 0930 end time:  1005.  Assessment:  Primary concerns today: Weight management and Blood sugar control.  Weight 209.3# today.; weight loss    15.7 # since 02/2014. She desires slow weight loss. Weighs daily at home to self monitor.  Activity:  Bikes on stationary 3 x a day for few minutes each time-and walks 30-35 minutes almost every day Meter download: averages: 30 day:147, 2 lows since increase in metformin, she reports dizzy both times, range 69-231 for past 30 days, 69-155 for past week.  87% of CBGs<180 x 30 days, highest readings now fasting.  Diabetes medicine: insulin 70/30 taking 28 units in AM and 15 before dinner in PM. Metformin 1000 mg twice daily- she reports no problems with tolerance.  Patient Identified Concern: : Weight management and Blood sugar control.  Stage Patient Is In:  Contemplative to ready in regards to  decreased sodium/fast foods, higher fiber and more nutrient dense choices. Patient Reported Barriers: Pain from fall in ankles.  Patient Reported Perceived Benefits:  Feel better about herself, better health, less stress on joints Patient Reports Self-Efficacy:  Improving Behavior Change Supports: Family, CDE, doctor, likes group visits  Food Intake: has cut back on fruit, past and rice and trying not to use sugar in her tea, does not drink smoothies or prtoein shakes . Does not quantify foods- measure or keep detailed food log 24-hr recall:  730 am coffee with cream and sugar, then another cup of coffee, 10 am- crackers and cheese 12 Pm- [place] and cheese sandwich on whole grain bread, water 3 Pm- fruit and popcorn, chips or nuts  530 Pm- vegetables and chicken or tuna salad,   8 PM- peanut butter cookies x2, tea  Progress Towards Goal(s):  Some progress.   Nutritional Diagnosis:  [name]-3.3 Overweight/obesity As related to long term imbalance between energy intake and output is very slowing improving As evidenced by her  BMI of 39.    Intervention:  Nutrition Education: Meal planning for diabetes, portion size awareness, increased self monitoring Coordination of care- Low blood sugar x2 since increasing metformin, morning insulin decreased by 2 units.  Teaching Method Utilized: Visual, Auditory Handouts given during visit include: AVS Demonstrated degree of understanding via:  Teach Back  Goals:  Weight < 200# eventually 160s, a1C < 7%  Monitoring/Evaluation:  Dietary intake, exercise, meter, and reevaluation of goals, body weight in 2 week(s).

## 2015-05-01 ENCOUNTER — Encounter: Payer: Self-pay | Admitting: Dietician

## 2015-05-01 ENCOUNTER — Ambulatory Visit (INDEPENDENT_AMBULATORY_CARE_PROVIDER_SITE_OTHER): Payer: Medicare Other | Admitting: Dietician

## 2015-05-01 VITALS — Wt 213.8 lb

## 2015-05-01 DIAGNOSIS — E1140 Type 2 diabetes mellitus with diabetic neuropathy, unspecified: Secondary | ICD-10-CM | POA: Diagnosis not present

## 2015-05-01 DIAGNOSIS — Z794 Long term (current) use of insulin: Secondary | ICD-10-CM

## 2015-05-01 NOTE — Patient Instructions (Signed)
Keep walking. Your blood sugars are good!  Limit your sodium  Your meal plan:  Meals 400 calories Snacks 100 calories

## 2015-05-01 NOTE — Progress Notes (Signed)
Medical Nutrition Therapy:  Appt start time: 0930 end time:  1030.  Assessment:  Primary concerns today: Weight management and Blood sugar control.  Patient attended a weight loss Medical Nutrition Therapy group for diabetes. Eduction included how to incorporate more no meat foods into the meal plan, importance of daily activity and group support. Weight 213.3# today.; weight loss  13.7 # since 02/2014. She desires slow weight loss. Weighs daily at home to self monitor.  Activity:  Bikes on stationary for few minutes each day and walks 30-35 minutes almost every day Meter download: averages: 30 day:152 (147 last month), 1 low since last visit, range 69-234.  81.5% of CBGs<180 x 30 days, highest readings now bedtime and fasting.  Diabetes medicine: insulin 70/30 taking 26 units in AM and 15 before dinner in PM. Metformin 1000 mg twice daily- she reports no problems with tolerance.  Patient Identified Concern: : Weight management and Blood sugar control.  Stage Patient Is In:  Contemplative to ready . Patient Reported Barriers: Pain from fall in ankles.  Patient Reported Perceived Benefits:  Feel better about herself, better health, less stress on joints Patient Reports Self-Efficacy:  Improving Behavior Change Supports: Family, CDE, doctor, likes group visits   Progress Towards Goal(s):  Some progress.   Nutritional Diagnosis:  [place]-3.3 Overweight/obesity As related to long term imbalance between energy intake and output is very slowing improving As evidenced by her BMI of 40.    Intervention:  Nutrition Education: Meal planning for diabetes, portion size awareness, increased self monitoring Coordination of care- Suggest insulin change to 24 units QAM and 17 units QPM..   Handouts given during visit include: AVS Demonstrated degree of understanding via:  Teach Back  Goals:  Weight < 200# eventually 160s, a1C < 7%  Monitoring/Evaluation:  Dietary intake, exercise, meter, and reevaluation of  goals, body weight in 4 week(s).

## 2015-05-05 ENCOUNTER — Telehealth: Payer: Self-pay | Admitting: Dietician

## 2015-05-05 DIAGNOSIS — E1140 Type 2 diabetes mellitus with diabetic neuropathy, unspecified: Secondary | ICD-10-CM

## 2015-05-05 MED ORDER — INSULIN ISOPHANE & REGULAR (HUMAN 70-30)100 UNIT/ML KWIKPEN: PEN_INJECTOR | SUBCUTANEOUS | Status: DC

## 2015-05-05 NOTE — Telephone Encounter (Signed)
Discussed insulin  Change with Dr. Lynnae January to 24 units of Humulin 70/30 before breakfast and 17 units before dinner.  Called patient and she is agreeable to this change

## 2015-05-05 NOTE — Addendum Note (Signed)
Addended by: Resa Miner on: 05/05/2015 01:59 PM   Modules accepted: Orders

## 2015-05-26 ENCOUNTER — Telehealth: Payer: Self-pay

## 2015-05-26 NOTE — Telephone Encounter (Deleted)
Patient desires surgical management with myomectomy.  The risks of surgery were discussed in detail with the patient at her last visit including but not limited to: bleeding which may require transfusion or reoperation; infection which may require prolonged hospitalization or re-hospitalization and antibiotic therapy; injury to bowel, bladder, ureters and major vessels or other surrounding organs; need for additional procedures including laparotomy; thromboembolic phenomenon, incisional problems and other postoperative or anesthesia complications.  Patient was told that the likelihood that her condition and symptoms will be treated effectively with this surgical management was very high; the postoperative expectations were also discussed in detail. The patient also understands the alternative treatment options which were discussed in full. All questions were answered.  She was told that she will be contacted by our surgical scheduler regarding the time and date of her surgery; routine preoperative instructions of having nothing to eat or drink after midnight on the day prior to surgery and also coming to the hospital 1 1/2 hours prior to her time of surgery were also emphasized.  She was told she may be called for a preoperative appointment about a week prior to surgery and will be given further preoperative instructions at that visit. Printed patient education handouts about the procedure were given to the patient to review at home.  Neylan Koroma L. Harraway-Smith, M.D., Cherlynn June

## 2015-05-26 NOTE — Telephone Encounter (Deleted)
Pt called and stated that Dr. Ihor Dow told her to call when she has decided on what surgery she wants; can you please have her call me.

## 2015-05-29 ENCOUNTER — Ambulatory Visit: Payer: Medicare Other | Admitting: Dietician

## 2015-05-29 NOTE — Telephone Encounter (Signed)
Error

## 2015-05-30 NOTE — Telephone Encounter (Signed)
error 

## 2015-05-30 NOTE — Telephone Encounter (Deleted)
Error to existing note

## 2015-06-12 ENCOUNTER — Encounter: Payer: Self-pay | Admitting: Dietician

## 2015-06-12 ENCOUNTER — Ambulatory Visit (INDEPENDENT_AMBULATORY_CARE_PROVIDER_SITE_OTHER): Payer: Medicare Other | Admitting: Dietician

## 2015-06-12 VITALS — Wt 212.9 lb

## 2015-06-12 DIAGNOSIS — Z713 Dietary counseling and surveillance: Secondary | ICD-10-CM

## 2015-06-12 DIAGNOSIS — E119 Type 2 diabetes mellitus without complications: Secondary | ICD-10-CM

## 2015-06-12 DIAGNOSIS — E669 Obesity, unspecified: Secondary | ICD-10-CM

## 2015-06-12 DIAGNOSIS — Z794 Long term (current) use of insulin: Secondary | ICD-10-CM | POA: Diagnosis not present

## 2015-06-12 DIAGNOSIS — E1140 Type 2 diabetes mellitus with diabetic neuropathy, unspecified: Secondary | ICD-10-CM

## 2015-06-12 NOTE — Patient Instructions (Signed)
See you in two weeks!  Keep exercising and eating healthy!  Your weight is staying the same and your blood sugars are excellent!!!  Stay on 25 units with breakfast and 15 units with dinner.

## 2015-06-12 NOTE — Progress Notes (Signed)
Medical Nutrition Therapy:  Appt start time: 0935 end time:  1020. Last visit 05/01/15 Assessment:  Primary concerns today: Weight management and Blood sugar control.  Patient wanted to know if she could decrease the metformin since her blood sugars are doing so well. We discussed that this is a very good medicine for insulin resistance. Eats fried fish, french fires once in a while Weight 212.9# today; weight stable at plateau after loss. She is content with her current weight today. Weighs daily at home to self monitor.  Activity:  Bikes on stationary bike, does leg exercises and push ups daily for 30 minutes Meter download: averages: 30 day:150 (152 last month), no lows, range 92-237.  89.5% of CBGs<180 x 30 days, highest readings at bedtime .  Diabetes medicine: insulin 70/30 taking 25 units in AM and 15 before dinner in PM. Metformin 1000 mg twice daily- she reports no problems with tolerance.  Patient Identified Concern: : Weight management and Blood sugar control.  Stage Patient Is In:  Contemplative to ready . Patient Reported Barriers: Pain from fall in ankles.  Patient Reported Perceived Benefits:  Feel better about herself, better health, less stress on joints Patient Reports Self-Efficacy:  Improving Behavior Change Supports: Family, CDE, doctor, likes group visits  24-hr recall suggests intake of 1300-1400 kcal:  B ( AM)- coffee or tea and vegetable smoothie or cold cereal with 2% milk   L ( PM)- peach and chef salad  Snk ( PM)-greek yogurt  D ( PM)- vegetables, fish 2 rolls  Snk ( PM)-sometimes yogurt   Typical day? Yes.    Progress Towards Goal(s):  No progress.   Nutritional Diagnosis:  [place]-3.3 Overweight/obesity As related to long term imbalance between energy intake and output is stable As evidenced by her BMI of 40.    Intervention:  Nutrition Education: blood sugar control is excellent, weight stable. Reassess goal of < 200# at next visit. Meal planning for healthy  choices when eating out done today.   Handouts given during visit include: AVS Demonstrated degree of understanding via:  Teach Back  Goals:  Weight < 200# eventually 160s, a1C < 7%  Monitoring/Evaluation:  Dietary intake, exercise, meter, and reevaluation of goals, body weight in 2 week(s).

## 2015-06-26 ENCOUNTER — Ambulatory Visit: Payer: Medicare Other | Admitting: Dietician

## 2015-06-27 ENCOUNTER — Encounter: Payer: Self-pay | Admitting: Internal Medicine

## 2015-06-27 ENCOUNTER — Ambulatory Visit (INDEPENDENT_AMBULATORY_CARE_PROVIDER_SITE_OTHER): Payer: Medicare Other | Admitting: Internal Medicine

## 2015-06-27 VITALS — BP 135/51 | HR 82 | Temp 98.3°F | Ht 61.0 in | Wt 215.6 lb

## 2015-06-27 DIAGNOSIS — R222 Localized swelling, mass and lump, trunk: Secondary | ICD-10-CM | POA: Diagnosis present

## 2015-06-27 DIAGNOSIS — Z7982 Long term (current) use of aspirin: Secondary | ICD-10-CM

## 2015-06-27 MED ORDER — HYDROCORTISONE 2.5 % EX LOTN: TOPICAL_LOTION | CUTANEOUS | Status: DC

## 2015-06-27 NOTE — Progress Notes (Signed)
Patient ID: [name], female   DOB: [birth year], 64 y.o.   MRN: [id number]   Subjective:   Patient ID: [name] female   DOB: 1951/01/07 64 y.o.   MRN: [id number]  HPI: [name] is a 63 y.o. with PMH listed below. Presented today with complainst of cyst on right side of her chest- that hurts and burns. She says when she has a sinus infection it flares up. Had the cysts for the first time last year. But the cysts comes and goes, depending on when she has a sinus infection. Pain is wrose when she takes a deep breath ansd when she lys on it. She sayas sometimes it swells to theu size of a grapefruit or an egg sometimes.  [facility] and has been [facility] by other providers. She says it has been lanced in the past here but nothing came out.   The cysts appear anywhere on her body- eye, privates They become warm when she has them but she denies fever. No family member with such cysts .  Past Medical History  Diagnosis Date  . Chest pain 11/07    cath 11/07 : LAD luminal irregularities, ramus intermediate with superior branch 70% stenosis, circumflex 50% stenosis, marginal 50% stenosis, well preserved ejection fraction. Stress test Jan 2013 : suggestion of lateral wall ischemia, likely artifact  . Hypertension     Requires 5 drug therapy and still difficult to control.  Marland Kitchen GERD (gastroesophageal reflux disease)   . Hyperlipidemia     On daily statin  . Obesity     Morbid. HAs worked with [name] T previously.  . Colonic polyp 1995    Colonoscopies 1994, 2000, and 02/02/2011. Last had 9 polyps - Tubular adenoma and tubulovillous was the pathology results without high grade dysplasia  . Chronic pain     MR 08/07/10 :  Spondylosis L4-5 with B foraminal narrowing and L4 nerve root enchroachment B.  . Seasonal allergies     seasonal  . Iron deficiency anemia     Ferritin was 12 in 2012. on FeSo4. Has had colon and EGD 02/02/11 that showed mild gastritis and  colon polyps.  . Chronic venous insufficiency 2012    Has had full W/U incl ECHO, LFT's, Cr, Umicro, TSH, BNP. Symptomatic treatment.  . Diastolic CHF, chronic     NL EF by echo 01/2012, Gr I dd  . Arthritis     knees  . 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 S/p lumpectomy and radiation.  . Diabetes mellitus type II     Insulin dependent. Has worked with [name] R previously.  . OSA (obstructive sleep apnea) 02/16/2007    02/2007 : Moderate AHI 35.6, O2 sat decreased to 65%, CPAP titration to 16 with AHI of 3.6. 02/2014 : Rare respiratory events with sleep disturbance, within normal limits. AHI 0.4 per hour       Current Outpatient Prescriptions  Medication Sig Dispense Refill  . ACCU-CHEK FASTCLIX LANCETS MISC USE ONE LANCET THREE TIMES DAILY BEFORE MEALS. DX CODE: E11.40 102 each 11  . amLODipine (NORVASC) 10 MG tablet TAKE ONE (1) TABLET BY MOUTH EVERY DAY 90 tablet 3  . aspirin EC 81 MG tablet Take 81 mg by mouth daily.    Marland Kitchen atorvastatin (LIPITOR) 80 MG tablet TAKE ONE TABLET BY MOUTH EVERY DAY 90 tablet 3  . Bilberry, Vaccinium myrtillus, (BILBERRY PO)  Take 1,000 mg by mouth daily.      . Calcium-Magnesium-Vitamin D (CALCIUM MAGNESIUM PO) Take 1 tablet by mouth daily.    . cimetidine (TAGAMET) 200 MG tablet Take 200 mg by mouth 2 (two) times daily.    . cloNIDine (CATAPRES) 0.2 MG tablet Take 1 tablet (0.2 mg total) by mouth 2 (two) times daily. 180 tablet 3  . docusate sodium (COLACE) 100 MG capsule Take 100 mg by mouth 2 (two) times daily.    . furosemide (LASIX) 20 MG tablet Take 1 tablet (20 mg total) by mouth daily as needed. 90 tablet 2  . gabapentin (NEURONTIN) 300 MG capsule TAKE ONE (1) CAPSULE BY MOUTH 2 TIMES DAILY 180 capsule 3  . glucose 4 GM chewable tablet Chew 4 tablets (16 g total) by mouth as needed for low blood sugar. 50 tablet 12  . glucose blood (ACCU-CHEK SMARTVIEW) test strip Check blood sugar up to 3 times daily  Dx  code E11.40. Insulin dependent 100 each 11  . ibuprofen (ADVIL,MOTRIN) 800 MG tablet Take 1 tablet (800 mg total) by mouth every 8 (eight) hours as needed. For pain 90 tablet 0  . Insulin Isophane & Regular Human (HUMULIN 70/30 MIX) (70-30) 100 UNIT/ML PEN Inject 24 units before morning meal and 17 units before evening meal. 15 mL 11  . Insulin Syringe-Needle U-100 31G X 15/64" 0.5 ML MISC 1 applicator by Does not apply route 2 (two) times daily. Dx code E11.40 100 each 12  . IRON PO Take 27 mg by mouth daily.    Marland Kitchen loratadine (CLARITIN) 10 MG tablet TAKE ONE TABLET BY MOUTH ONCE DAILY FOR ALLERGIES 90 tablet 3  . losartan (COZAAR) 50 MG tablet TAKE ONE (1) TABLET BY MOUTH EVERY DAY 90 tablet 3  . metFORMIN (GLUCOPHAGE) 1000 MG tablet TAKE ONE (1) TABLET BY MOUTH TWO (2) TIMES DAILY WITH A MEAL 180 tablet 3  . metoprolol succinate (TOPROL-XL) 100 MG 24 hr tablet Take 1 tablet (100 mg total) by mouth daily. 90 tablet 3  . mometasone (NASONEX) 50 MCG/ACT nasal spray Place 2 sprays into the nose daily. 17 g 11  . Multiple Vitamins-Minerals (WOMENS ONE DAILY) TABS Take 1 tablet by mouth daily.    . nitroGLYCERIN (NITRODUR - DOSED IN MG/24 HR) 0.4 mg/hr patch Place 1 patch (0.4 mg total) onto the skin daily. Leave on for 12 hours then remove for 12 hours before placing new patch. 90 patch 3  . nitroGLYCERIN (NITROSTAT) 0.4 MG SL tablet Place 1 tablet (0.4 mg total) under the tongue every 5 (five) minutes as needed for chest pain. 25 tablet 3  . Nutritional Supplements (GRAPESEED EXTRACT PO) Take 650 mg by mouth daily.    . Olopatadine HCl (PATADAY) 0.2 % SOLN Place 1 drop into both eyes daily as needed. For allergies    . polyethylene glycol powder (GLYCOLAX/MIRALAX) powder Take 17 g by mouth daily as needed. For constipation    . Potassium 99 MG TABS Take 99 mg by mouth daily.    Marland Kitchen senna (SENOKOT) 8.6 MG TABS tablet Take 2 tablets (17.2 mg total) by mouth daily. (Patient not taking: Reported on 06/12/2015)  60 each 2  . traMADol (ULTRAM) 50 MG tablet Take 1 tablet (50 mg total) by mouth every 6 (six) hours as needed. 60 tablet 2  . Turmeric, Curcuma Longa, POWD 1 scoop by Does not apply route daily.     No current facility-administered medications for this visit.  Family History  Problem Relation Age of Onset  . Depression Daughter 62  . Osteoarthritis Maternal Grandmother   . Lung cancer Mother   . Diabetes Father   . Breast cancer Sister   . Pulmonary embolism Brother   . Colon cancer Neg Hx   . Heart attack Brother 20   Social History   Social History  . Marital Status: Divorced    Spouse Name: N/A  . Number of Children: N/A  . Years of Education: 44   Social History Main Topics  . Smoking status: Never Smoker   . Smokeless tobacco: Never Used  . Alcohol Use: No  . Drug Use: No  . Sexual Activity: Not on file   Other Topics Concern  . Not on file   Social History Narrative   Worked at [employer] part-time from [phone], 2009-2011. No longer working 2015.Associates degree in early childhood education. Single, lives by self.   Review of Systems: CONSTITUTIONAL- No Fever, weightloss, . SKIN- No Rash, colour changes or itching. HEAD- No Headache or dizziness. EYES- No Vision loss, pain, redness, double or blurred vision. Mouth/throat- No Sorethroat, dentures, or bleeding gums. RESPIRATORY- No Cough or SOB. CARDIAC- No Palpitations, DOE, PND or chest pain. GI- No nausea, vomiting, diarrhoea, constipation, abd pain. URINARY- No Frequency, or dysuria. NEUROLOGIC- No Numbness, syncope, seizures . [facility]- Denies depression or anxiety.  Objective:  Physical Exam: Filed Vitals:   06/27/15 1323  BP: 135/51  Pulse: 82  Temp: 98.3 F (36.8 C)  TempSrc: Oral  Height: 5\' 1"  (1.549 m)  Weight: 215 lb 9.6 oz (97.796 kg)  SpO2: 100%   GENERAL- alert, co-operative, appears as stated age, not in any distress. HEENT- Atraumatic, normocephalic, PERRL, EOMI, oral  mucosa appears moist, good and intact dentition. No carotid bruit, no cervical LN enlargement, thyroid does not appear enlarged, neck supple. CARDIAC- 2/6 murmur heard aortic area, but ECHO - 2013 shows trivial aortic regurg, RRR, no murmurs, rubs or gallops. RESP- Moving equal volumes of air, and clear to auscultation bilaterally, no wheezes or crackles. ABDOMEN- Soft, nontender, no guarding or rebound, no palpable masses or organomegaly, bowel sounds present. BACK- Normal curvature of the spine, No tenderness along the vertebrae, no CVA tenderness. Trunk- ~2 by ~2 cm swelling on right side of chest, just lateral to the right infra- mamary fold, in line with the middle of the axilla, with firm nodule swelling underneath surface swelling. Tender. Hyperpigmented but not erythematous. NEURO- No obvious Cr N abnormality, strenght upper and lower extremities- 5/5, Sensation intact- globally, DTRs- Normal, finger to nose test normal bilat, rapid alternating movement- intact, Gait- Normal. EXTREMITIES- pulse 2+, symmetric, no pedal edema. SKIN- Warm, dry, No rash or lesion. PSYCH- Normal mood and affect, appropriate thought content and speech.  Assessment & Plan:   The patient's case and plan of care was discussed with attending physician, Dr. Lynnae January.  Please see problem based charting for assessment and plan.

## 2015-06-27 NOTE — Patient Instructions (Signed)
We have calledin a prescription for topical steroids. We will give this a try.  Also we will send you to the general surgeons to get the swelling/lump removed.  Please keep your upcoming appointment with your doctor next month.

## 2015-06-27 NOTE — Assessment & Plan Note (Addendum)
Pt with persistent but intermittent right sided swelling on right side of chest. Presently tender, hyperpigmented, not erythematous. Unsure of relationship to patients sinus infections. Lesion does not look acutely inflammed but it is causing discomfort to patient with deep breathing and when she lies on that side when she is sleeping.  Differentials- Epidermal cysts , cutaneous cyst, Sebaceous cyst. Doubt lipoma as it comes and goes.   Plan- Pt will like to try a stronger Hydrocortisone cream, told her this would not work but patient was insistent on trying this, 2.5% cream sent to pharm. - Will refer to general surgery, maybe cyst sac can be excised and sent for biopsy or ultrasound if something drainable present.

## 2015-06-28 NOTE — Progress Notes (Signed)
Internal Medicine Clinic Attending  Case discussed with Dr. Emokpae soon after the resident saw the patient.  We reviewed the resident's history and exam and pertinent patient test results.  I agree with the assessment, diagnosis, and plan of care documented in the resident's note. 

## 2015-07-10 ENCOUNTER — Ambulatory Visit (INDEPENDENT_AMBULATORY_CARE_PROVIDER_SITE_OTHER): Payer: Medicare Other

## 2015-07-10 ENCOUNTER — Encounter: Payer: Self-pay | Admitting: Internal Medicine

## 2015-07-10 ENCOUNTER — Ambulatory Visit (INDEPENDENT_AMBULATORY_CARE_PROVIDER_SITE_OTHER): Payer: Medicare Other | Admitting: Internal Medicine

## 2015-07-10 VITALS — BP 136/66 | HR 67 | Temp 98.0°F | Wt 211.2 lb

## 2015-07-10 DIAGNOSIS — I872 Venous insufficiency (chronic) (peripheral): Secondary | ICD-10-CM | POA: Diagnosis not present

## 2015-07-10 DIAGNOSIS — Z79899 Other long term (current) drug therapy: Secondary | ICD-10-CM | POA: Diagnosis not present

## 2015-07-10 DIAGNOSIS — Z1159 Encounter for screening for other viral diseases: Secondary | ICD-10-CM

## 2015-07-10 DIAGNOSIS — D509 Iron deficiency anemia, unspecified: Secondary | ICD-10-CM

## 2015-07-10 DIAGNOSIS — Z0000 Encounter for general adult medical examination without abnormal findings: Secondary | ICD-10-CM

## 2015-07-10 DIAGNOSIS — R000 Tachycardia, unspecified: Secondary | ICD-10-CM

## 2015-07-10 DIAGNOSIS — I10 Essential (primary) hypertension: Secondary | ICD-10-CM | POA: Diagnosis not present

## 2015-07-10 DIAGNOSIS — Z794 Long term (current) use of insulin: Principal | ICD-10-CM

## 2015-07-10 DIAGNOSIS — E1165 Type 2 diabetes mellitus with hyperglycemia: Secondary | ICD-10-CM | POA: Diagnosis present

## 2015-07-10 DIAGNOSIS — E1140 Type 2 diabetes mellitus with diabetic neuropathy, unspecified: Secondary | ICD-10-CM | POA: Diagnosis not present

## 2015-07-10 DIAGNOSIS — E785 Hyperlipidemia, unspecified: Secondary | ICD-10-CM | POA: Diagnosis not present

## 2015-07-10 DIAGNOSIS — E669 Obesity, unspecified: Secondary | ICD-10-CM | POA: Diagnosis not present

## 2015-07-10 LAB — GLUCOSE, CAPILLARY: Glucose-Capillary: 153 mg/dL — ABNORMAL HIGH (ref 65–99)

## 2015-07-10 LAB — POCT GLYCOSYLATED HEMOGLOBIN (HGB A1C): Hemoglobin A1C: 8

## 2015-07-10 NOTE — Assessment & Plan Note (Signed)
A1C increased from 7.4 to 8.0 today. She checks her CBG about once a day, mostly in AM fasting. Her fasting CBG increased from about 120-140 in Sept to 140-190 in Oct. When asked what caused this, she replies she is eating out with her daughters for dinner. [name] has taught her carb counting but the issue seems to be sampling bites from her daughters' meals. She preferred to work on diet manipulation rather than changing insulin. Has appt [name] next week.

## 2015-07-10 NOTE — Assessment & Plan Note (Signed)
BP Readings from Last 3 Encounters:  07/10/15 136/66  06/27/15 135/51  04/14/15 137/78    Well controlled. Tolerated adding back her norvasc. No dizziness. Cont   Norvasc 10 Clonidine 0.2 BID Lasix 20 PRN Losartan 50   C/O fast HR q 1-3 days. Can occur while sitting or active. Heart beats fast and others can "hear it." Heart "beating out of chest." Can feel pulsation in fingers. Otherwise she feels calm - no anxiety. CP occurs after the fast HR.  Last one min bc takes a NTG and it resolves. Her HR is nl today. Saw cards in March and did not mention this sxs. Has RF for A Fib. Will order a 48 hr holter. Neglected to order TSH - asking lab if can add on.

## 2015-07-10 NOTE — Assessment & Plan Note (Addendum)
We talked about this quite a bit. She cannot afford Rx stockings so uses thinner ones. I educated her on the dx and complications - skin pigmentation (mild already), cellulitis).

## 2015-07-10 NOTE — Assessment & Plan Note (Signed)
HgB was nl 12.1 in Jan. Ferritin then was 52. She cannot tolerate PO iron. Will check HgB and ferritin today.

## 2015-07-10 NOTE — Assessment & Plan Note (Signed)
Refuses flu and zoster  Microalb today  We discussed Hep C testing and agrees.

## 2015-07-10 NOTE — Progress Notes (Signed)
   Subjective:    Patient ID: [name], female    DOB: May 09, 1951, 64 y.o.   MRN: [id number]  HPI  [name] is here for DM F/U. Please see the A&P for the status of the pt's chronic medical problems.   Review of Systems  Constitutional: Positive for unexpected weight change. Negative for activity change and appetite change.  Respiratory: Positive for chest tightness and shortness of breath.   Cardiovascular: Positive for chest pain and leg swelling. Negative for palpitations.       Tachycardia  Musculoskeletal: Positive for gait problem. Negative for myalgias.  Skin:       "Cyst" R chest wall with pain  Neurological: Positive for numbness. Negative for dizziness and light-headedness.   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 She appears well-developed and well-nourished. No distress.  HENT:  Head: Normocephalic and atraumatic.  Right Ear: External ear normal.  Left Ear: External ear normal.  Nose: Nose normal.  Eyes: Conjunctivae and EOM are normal.  Cardiovascular: Normal rate, regular rhythm and normal heart sounds.   Pulmonary/Chest: Effort normal and breath sounds normal.  Musculoskeletal: She exhibits edema.  Neurological: She is alert and oriented to person, place, and time.  Skin: Skin is warm and dry. She is not diaphoretic.  2 cm keloid R upper ABD lateral wall with 2 cm scar tissue. No abscess. No infxn.  Psychiatric: She has a normal mood and affect. Her behavior is normal. Judgment and thought content normal.          Assessment & Plan:

## 2015-07-10 NOTE — Assessment & Plan Note (Signed)
Wt Readings from Last 3 Encounters:  07/10/15 211 lb 3.2 oz (95.8 kg)  06/27/15 215 lb 9.6 oz (97.796 kg)  06/12/15 212 lb 14.4 oz (96.571 kg)   Overall, weight is down. She cont to work with [name] and is making slow, but steady, progress.

## 2015-07-10 NOTE — Assessment & Plan Note (Signed)
She stopped statin on her own 2/2 myalgias. This was a re challenge. Her cardiac risk is high but will not use statins.

## 2015-07-10 NOTE — Patient Instructions (Signed)
1. Work on [employer] when you eat ou with your family 2. I am setting up a heart monitor 3. I will mail you your test results

## 2015-07-10 NOTE — Addendum Note (Signed)
Addended by: Larey Dresser A on: 07/10/2015 01:39 PM   Modules accepted: Orders

## 2015-07-11 ENCOUNTER — Encounter: Payer: Self-pay | Admitting: Internal Medicine

## 2015-07-11 LAB — BMP8+ANION GAP
ANION GAP: 17 mmol/L (ref 10.0–18.0)
BUN / CREAT RATIO: 20 (ref 11–26)
BUN: 16 mg/dL (ref 8–27)
CO2: 24 mmol/L (ref 18–29)
Calcium: 9.7 mg/dL (ref 8.7–10.3)
Chloride: 100 mmol/L (ref 97–108)
Creatinine, Ser: 0.8 mg/dL (ref 0.57–1.00)
GFR, EST AFRICAN AMERICAN: 90 mL/min/{1.73_m2} (ref >59)
GFR, EST NON AFRICAN AMERICAN: 78 mL/min/{1.73_m2} (ref >59)
Glucose: 172 mg/dL — ABNORMAL HIGH (ref 65–99)
POTASSIUM: 3.9 mmol/L (ref 3.5–5.2)
SODIUM: 141 mmol/L (ref 134–144)

## 2015-07-11 LAB — CBC
Hematocrit: 38.4 % (ref 34.0–46.6)
Hemoglobin: 12 g/dL (ref 11.1–15.9)
MCH: 28 pg (ref 26.6–33.0)
MCHC: 31.3 g/dL — AB (ref 31.5–35.7)
MCV: 90 fL (ref 79–97)
PLATELETS: 282 10*3/uL (ref 150–379)
RBC: 4.29 x10E6/uL (ref 3.77–5.28)
RDW: 15.8 % — AB (ref 12.3–15.4)
WBC: 6.1 10*3/uL (ref 3.4–10.8)

## 2015-07-11 LAB — MICROALBUMIN / CREATININE URINE RATIO
CREATININE, UR: 167.1 mg/dL
MICROALB/CREAT RATIO: 16.9 mg/g{creat} (ref 0.0–30.0)
Microalbumin, Urine: 28.2 ug/mL

## 2015-07-11 LAB — HCV COMMENT:

## 2015-07-11 LAB — FERRITIN: FERRITIN: 58 ng/mL (ref 15–150)

## 2015-07-11 LAB — TSH: TSH: 3.81 u[IU]/mL (ref 0.450–4.500)

## 2015-07-11 LAB — HEPATITIS C ANTIBODY (REFLEX): HCV Ab: <0.1 s/co ratio (ref 0.0–0.9)

## 2015-07-21 DIAGNOSIS — L729 Follicular cyst of the skin and subcutaneous tissue, unspecified: Secondary | ICD-10-CM | POA: Diagnosis not present

## 2015-08-04 ENCOUNTER — Other Ambulatory Visit: Payer: Self-pay | Admitting: Internal Medicine

## 2015-08-05 ENCOUNTER — Ambulatory Visit: Payer: Medicare Other | Admitting: Cardiology

## 2015-08-05 MED ORDER — ACCU-CHEK FASTCLIX LANCETS MISC: Status: DC

## 2015-08-05 NOTE — Telephone Encounter (Signed)
One yr's supply sent in [facility]

## 2015-08-05 NOTE — Telephone Encounter (Signed)
Thank you.  Done.

## 2015-08-05 NOTE — Addendum Note (Signed)
Addended by: Larey Dresser A on: 08/05/2015 01:40 PM   Modules accepted: Orders

## 2015-08-14 ENCOUNTER — Emergency Department (HOSPITAL_COMMUNITY): Payer: Medicare Other

## 2015-08-14 ENCOUNTER — Encounter (HOSPITAL_COMMUNITY): Payer: Self-pay | Admitting: *Deleted

## 2015-08-14 ENCOUNTER — Observation Stay (HOSPITAL_COMMUNITY)
Admission: EM | Admit: 2015-08-14 | Discharge: 2015-08-17 | Disposition: A | Discharge status: Home / Self Care | Payer: Medicare Other | Attending: Cardiology | Admitting: Cardiology

## 2015-08-14 DIAGNOSIS — Z853 Personal history of malignant neoplasm of breast: Secondary | ICD-10-CM | POA: Diagnosis not present

## 2015-08-14 DIAGNOSIS — I25110 Atherosclerotic heart disease of native coronary artery with unstable angina pectoris: Secondary | ICD-10-CM | POA: Diagnosis not present

## 2015-08-14 DIAGNOSIS — R079 Chest pain, unspecified: Secondary | ICD-10-CM | POA: Diagnosis not present

## 2015-08-14 DIAGNOSIS — E1140 Type 2 diabetes mellitus with diabetic neuropathy, unspecified: Secondary | ICD-10-CM | POA: Insufficient documentation

## 2015-08-14 DIAGNOSIS — I110 Hypertensive heart disease with heart failure: Secondary | ICD-10-CM | POA: Insufficient documentation

## 2015-08-14 DIAGNOSIS — E669 Obesity, unspecified: Secondary | ICD-10-CM

## 2015-08-14 DIAGNOSIS — I10 Essential (primary) hypertension: Secondary | ICD-10-CM | POA: Diagnosis not present

## 2015-08-14 DIAGNOSIS — I2510 Atherosclerotic heart disease of native coronary artery without angina pectoris: Secondary | ICD-10-CM | POA: Diagnosis present

## 2015-08-14 DIAGNOSIS — E785 Hyperlipidemia, unspecified: Secondary | ICD-10-CM | POA: Insufficient documentation

## 2015-08-14 DIAGNOSIS — I2584 Coronary atherosclerosis due to calcified coronary lesion: Secondary | ICD-10-CM | POA: Insufficient documentation

## 2015-08-14 DIAGNOSIS — R946 Abnormal results of thyroid function studies: Secondary | ICD-10-CM | POA: Diagnosis not present

## 2015-08-14 DIAGNOSIS — G4733 Obstructive sleep apnea (adult) (pediatric): Secondary | ICD-10-CM | POA: Diagnosis not present

## 2015-08-14 DIAGNOSIS — I4581 Long QT syndrome: Secondary | ICD-10-CM | POA: Insufficient documentation

## 2015-08-14 DIAGNOSIS — E876 Hypokalemia: Secondary | ICD-10-CM | POA: Diagnosis not present

## 2015-08-14 DIAGNOSIS — I25119 Atherosclerotic heart disease of native coronary artery with unspecified angina pectoris: Secondary | ICD-10-CM

## 2015-08-14 DIAGNOSIS — E11319 Type 2 diabetes mellitus with unspecified diabetic retinopathy without macular edema: Secondary | ICD-10-CM | POA: Diagnosis not present

## 2015-08-14 DIAGNOSIS — I5032 Chronic diastolic (congestive) heart failure: Secondary | ICD-10-CM | POA: Diagnosis not present

## 2015-08-14 DIAGNOSIS — K219 Gastro-esophageal reflux disease without esophagitis: Secondary | ICD-10-CM | POA: Diagnosis not present

## 2015-08-14 DIAGNOSIS — Z7982 Long term (current) use of aspirin: Secondary | ICD-10-CM | POA: Diagnosis not present

## 2015-08-14 DIAGNOSIS — I200 Unstable angina: Secondary | ICD-10-CM

## 2015-08-14 DIAGNOSIS — R9431 Abnormal electrocardiogram [ECG] [EKG]: Secondary | ICD-10-CM | POA: Diagnosis present

## 2015-08-14 DIAGNOSIS — I509 Heart failure, unspecified: Secondary | ICD-10-CM | POA: Diagnosis not present

## 2015-08-14 DIAGNOSIS — R002 Palpitations: Secondary | ICD-10-CM | POA: Insufficient documentation

## 2015-08-14 DIAGNOSIS — R7989 Other specified abnormal findings of blood chemistry: Secondary | ICD-10-CM | POA: Diagnosis present

## 2015-08-14 DIAGNOSIS — I440 Atrioventricular block, first degree: Secondary | ICD-10-CM | POA: Diagnosis not present

## 2015-08-14 DIAGNOSIS — Z6839 Body mass index (BMI) 39.0-39.9, adult: Secondary | ICD-10-CM | POA: Diagnosis not present

## 2015-08-14 DIAGNOSIS — Z79899 Other long term (current) drug therapy: Secondary | ICD-10-CM | POA: Insufficient documentation

## 2015-08-14 HISTORY — DX: Essential (primary) hypertension: I10

## 2015-08-14 HISTORY — DX: Atherosclerotic heart disease of native coronary artery without angina pectoris: I25.10

## 2015-08-14 LAB — CBC
HCT: 35.9 % — ABNORMAL LOW (ref 36.0–46.0)
Hemoglobin: 11.6 g/dL — ABNORMAL LOW (ref 12.0–15.0)
MCH: 28.4 pg (ref 26.0–34.0)
MCHC: 32.3 g/dL (ref 30.0–36.0)
MCV: 88 fL (ref 78.0–100.0)
Platelets: 243 10*3/uL (ref 150–400)
RBC: 4.08 MIL/uL (ref 3.87–5.11)
RDW: 15.6 % — AB (ref 11.5–15.5)
WBC: 6.4 10*3/uL (ref 4.0–10.5)

## 2015-08-14 LAB — BASIC METABOLIC PANEL
ANION GAP: 11 (ref 5–15)
BUN: 17 mg/dL (ref 6–20)
CALCIUM: 9.7 mg/dL (ref 8.9–10.3)
CO2: 25 mmol/L (ref 22–32)
CREATININE: 0.79 mg/dL (ref 0.44–1.00)
Chloride: 105 mmol/L (ref 101–111)
GFR calc non Af Amer: >60 mL/min (ref >60)
Glucose, Bld: 173 mg/dL — ABNORMAL HIGH (ref 65–99)
Potassium: 3.2 mmol/L — ABNORMAL LOW (ref 3.5–5.1)
Sodium: 141 mmol/L (ref 135–145)

## 2015-08-14 LAB — I-STAT TROPONIN, ED: TROPONIN I, POC: 0 ng/mL (ref 0.00–0.08)

## 2015-08-14 LAB — GLUCOSE, CAPILLARY: Glucose-Capillary: 140 mg/dL — ABNORMAL HIGH (ref 65–99)

## 2015-08-14 LAB — PROTIME-INR
INR: 0.93 (ref 0.00–1.49)
PROTHROMBIN TIME: 12.7 s (ref 11.6–15.2)

## 2015-08-14 LAB — APTT: APTT: 32 s (ref 24–37)

## 2015-08-14 LAB — MAGNESIUM: MAGNESIUM: 1.9 mg/dL (ref 1.7–2.4)

## 2015-08-14 MED ORDER — TRAMADOL HCL 50 MG PO TABS: 50.0000 mg | ORAL_TABLET | Freq: Four times a day (QID) | ORAL | Status: DC | PRN

## 2015-08-14 MED ORDER — POTASSIUM CHLORIDE CRYS ER 20 MEQ PO TBCR
40.0000 meq | EXTENDED_RELEASE_TABLET | Freq: Once | ORAL | Status: AC
  Administered 2015-08-14: 40 meq via ORAL
  Filled 2015-08-14: qty 2

## 2015-08-14 MED ORDER — CLONIDINE HCL 0.2 MG PO TABS
0.2000 mg | ORAL_TABLET | Freq: Two times a day (BID) | ORAL | Status: DC
  Administered 2015-08-15 – 2015-08-16 (×2): 0.2 mg via ORAL
  Filled 2015-08-14 (×4): qty 1

## 2015-08-14 MED ORDER — AMLODIPINE BESYLATE 10 MG PO TABS
10.0000 mg | ORAL_TABLET | Freq: Every day | ORAL | Status: DC
  Administered 2015-08-15 – 2015-08-16 (×2): 10 mg via ORAL
  Filled 2015-08-14 (×4): qty 1

## 2015-08-14 MED ORDER — INSULIN ASPART 100 UNIT/ML ~~LOC~~ SOLN
0.0000 [IU] | Freq: Three times a day (TID) | SUBCUTANEOUS | Status: DC
  Administered 2015-08-16 (×3): 3 [IU] via SUBCUTANEOUS
  Administered 2015-08-17: 4 [IU] via SUBCUTANEOUS

## 2015-08-14 MED ORDER — SODIUM CHLORIDE 0.9 % IJ SOLN: 3.0000 mL | INTRAMUSCULAR | Status: DC | PRN

## 2015-08-14 MED ORDER — SODIUM CHLORIDE 0.9 % IV SOLN
INTRAVENOUS | Status: DC
  Administered 2015-08-14: 22:00:00 via INTRAVENOUS

## 2015-08-14 MED ORDER — ONDANSETRON HCL 4 MG/2ML IJ SOLN
4.0000 mg | Freq: Four times a day (QID) | INTRAMUSCULAR | Status: DC | PRN
  Filled 2015-08-14: qty 2

## 2015-08-14 MED ORDER — ASPIRIN 81 MG PO CHEW
81.0000 mg | CHEWABLE_TABLET | ORAL | Status: AC
  Administered 2015-08-15: 81 mg via ORAL
  Filled 2015-08-14: qty 1

## 2015-08-14 MED ORDER — METOPROLOL SUCCINATE ER 100 MG PO TB24
100.0000 mg | ORAL_TABLET | Freq: Every day | ORAL | Status: DC
  Administered 2015-08-15 – 2015-08-17 (×3): 100 mg via ORAL
  Filled 2015-08-14 (×3): qty 1

## 2015-08-14 MED ORDER — SODIUM CHLORIDE 0.9 % IV SOLN: 250.0000 mL | INTRAVENOUS | Status: DC | PRN

## 2015-08-14 MED ORDER — NITROGLYCERIN 0.4 MG SL SUBL: 0.4000 mg | SUBLINGUAL_TABLET | SUBLINGUAL | Status: DC | PRN

## 2015-08-14 MED ORDER — LOSARTAN POTASSIUM 50 MG PO TABS
50.0000 mg | ORAL_TABLET | Freq: Every day | ORAL | Status: DC
  Administered 2015-08-15 – 2015-08-17 (×3): 50 mg via ORAL
  Filled 2015-08-14 (×3): qty 1

## 2015-08-14 MED ORDER — ASPIRIN EC 81 MG PO TBEC
81.0000 mg | DELAYED_RELEASE_TABLET | Freq: Every day | ORAL | Status: DC
  Administered 2015-08-15 – 2015-08-17 (×3): 81 mg via ORAL
  Filled 2015-08-14 (×4): qty 1

## 2015-08-14 MED ORDER — ACETAMINOPHEN 325 MG PO TABS: 650.0000 mg | ORAL_TABLET | ORAL | Status: DC | PRN

## 2015-08-14 MED ORDER — HEPARIN SODIUM (PORCINE) 5000 UNIT/ML IJ SOLN
5000.0000 [IU] | Freq: Three times a day (TID) | INTRAMUSCULAR | Status: DC
  Administered 2015-08-15 (×2): 5000 [IU] via SUBCUTANEOUS
  Filled 2015-08-14 (×2): qty 1

## 2015-08-14 MED ORDER — ASPIRIN 81 MG PO CHEW: 324.0000 mg | CHEWABLE_TABLET | Freq: Once | ORAL | Status: DC

## 2015-08-14 MED ORDER — SODIUM CHLORIDE 0.9 % IJ SOLN
3.0000 mL | Freq: Two times a day (BID) | INTRAMUSCULAR | Status: DC
  Administered 2015-08-15: 3 mL via INTRAVENOUS

## 2015-08-14 MED ORDER — SODIUM CHLORIDE 0.9 % WEIGHT BASED INFUSION
3.0000 mL/kg/h | INTRAVENOUS | Status: DC
  Administered 2015-08-15: 3 mL/kg/h via INTRAVENOUS

## 2015-08-14 MED ORDER — HEPARIN SODIUM (PORCINE) 5000 UNIT/ML IJ SOLN
4000.0000 [IU] | INTRAMUSCULAR | Status: DC
Start: 2015-08-14 — End: 2015-08-14

## 2015-08-14 MED ORDER — SODIUM CHLORIDE 0.9 % WEIGHT BASED INFUSION: 1.0000 mL/kg/h | INTRAVENOUS | Status: DC

## 2015-08-14 MED ORDER — GABAPENTIN 300 MG PO CAPS
300.0000 mg | ORAL_CAPSULE | Freq: Two times a day (BID) | ORAL | Status: DC
  Filled 2015-08-14 (×3): qty 1

## 2015-08-14 NOTE — ED Notes (Signed)
Pt arrives from home via [facility]. Pt c/o left sided chest pain that radiates down left arm. Pt had some dizziness and nausea. Pt rated pain 10/10 upon EMS arrival. EMS gave Nitro x2 PTA. Pt took 324mg  of ASA PTA. Upon arrival here pt states that she is pain free now.

## 2015-08-14 NOTE — H&P (Signed)
Cardiologist: [name] is an 64 y.o. female.   Chief Complaint: Chest Pain HPI:  The patient is a 64 year old morbidly obese female with a history of hypertension, GERD, hyperlipidemia, chronic diastolic heart failure, diabetes mellitus type 2, obstructive sleep apnea and chest pain. She left heart cath in November 2007 which revealed LAD luminal irregularities, ramus intermediate with superior branch 70% stenosis, circumflex 50% stenosis, marginal 50% stenosis, well preserved ejection fraction. Stress test Jan 2013 : suggestion of lateral wall ischemia, likely artifact  The patient presents with chest pain.  Patient reports developing left arm pain on $Remov"Sunday which radiated to her chest neck and jaw. The chest pain felt like heaviness like someone was sitting on her. There was associated shortness of breath, wheezing, nausea, diaphoresis, dizziness. She's been taking 2 nitroglycerin at a time with a little relief.  When EMS gave her one of their nitroglycerin seemed to ease the pain more from a 7 to a 5 out of 10. Her nitroglycerin apparently were expired.  She chronically has some lower extremity edema.  She occasionally rides an exercise bicycle.  Saturday was the last time she wrote it and didn't seem to be having any problems.  She has a prolonged QT interval and first degree AV block.  Filed Weights   08/14/15 1803             12/17/13  Weight: 208 lb (94.348 kg)       22"IyFHpO$ 2 lb       Medications: Medication Sig  ACCU-CHEK FASTCLIX LANCETS MISC USE ONE LANCET THREE TIMES DAILY BEFORE MEALS AND BEFORE BED. DX CODE: E11.40  amLODipine (NORVASC) 10 MG tablet TAKE ONE (1) TABLET BY MOUTH EVERY DAY  aspirin EC 81 MG tablet Take 81 mg by mouth daily.  Bilberry, Vaccinium myrtillus, (BILBERRY PO) Take 1,000 mg by mouth daily.    Calcium-Magnesium-Vitamin D (CALCIUM MAGNESIUM PO) Take 1 tablet by mouth daily.  cimetidine (TAGAMET) 200 MG tablet Take 200 mg by mouth 2 (two) times  daily.  cloNIDine (CATAPRES) 0.2 MG tablet Take 1 tablet (0.2 mg total) by mouth 2 (two) times daily.  docusate sodium (COLACE) 100 MG capsule Take 100 mg by mouth 2 (two) times daily.  furosemide (LASIX) 20 MG tablet Take 1 tablet (20 mg total) by mouth daily as needed.  gabapentin (NEURONTIN) 300 MG capsule TAKE ONE (1) CAPSULE BY MOUTH 2 TIMES DAILY  glucose 4 GM chewable tablet Chew 4 tablets (16 g total) by mouth as needed for low blood sugar.  glucose blood (ACCU-CHEK SMARTVIEW) test strip Check blood sugar up to 3 times daily  Dx code E11.40. Insulin dependent  hydrocortisone 2.5 % lotion Apply to affected area 2 times daily.  ibuprofen (ADVIL,MOTRIN) 800 MG tablet Take 1 tablet (800 mg total) by mouth every 8 (eight) hours as needed. For pain  Insulin Isophane & Regular Human (HUMULIN 70/30 MIX) (70-30) 100 UNIT/ML PEN Inject 24 units before morning meal and 17 units before evening meal.  Insulin Syringe-Needle U-100 31G X 15/64" 0.5 ML MISC 1 applicator by Does not apply route 2 (two) times daily. Dx code E11.40  IRON PO Take 27 mg by mouth daily.  loratadine (CLARITIN) 10 MG tablet TAKE ONE TABLET BY MOUTH ONCE DAILY FOR ALLERGIES  losartan (COZAAR) 50 MG tablet TAKE ONE (1) TABLET BY MOUTH EVERY DAY  metFORMIN (GLUCOPHAGE) 1000 MG tablet TAKE ONE (1) TABLET BY MOUTH TWO (2) TIMES DAILY WITH A MEAL  metoprolol  succinate (TOPROL-XL) 100 MG 24 hr tablet Take 1 tablet (100 mg total) by mouth daily.  mometasone (NASONEX) 50 MCG/ACT nasal spray Place 2 sprays into the nose daily.  Multiple Vitamins-Minerals (WOMENS ONE DAILY) TABS Take 1 tablet by mouth daily.  nitroGLYCERIN (NITRODUR - DOSED IN MG/24 HR) 0.4 mg/hr patch Place 1 patch (0.4 mg total) onto the skin daily. Leave on for 12 hours then remove for 12 hours before placing new patch.  nitroGLYCERIN (NITROSTAT) 0.4 MG SL tablet Place 1 tablet (0.4 mg total) under the tongue every 5 (five) minutes as needed for chest pain.  Nutritional  Supplements (GRAPESEED EXTRACT PO) Take 650 mg by mouth daily.  Olopatadine HCl (PATADAY) 0.2 % SOLN Place 1 drop into both eyes daily as needed. For allergies  polyethylene glycol powder (GLYCOLAX/MIRALAX) powder Take 17 g by mouth daily as needed. For constipation  Potassium 99 MG TABS Take 99 mg by mouth daily.  senna (SENOKOT) 8.6 MG TABS tablet Take 2 tablets (17.2 mg total) by mouth daily. Patient not taking: Reported on 06/12/2015  traMADol (ULTRAM) 50 MG tablet Take 1 tablet (50 mg total) by mouth every 6 (six) hours as needed.  Turmeric, Curcuma [place], POWD 1 scoop by Does not apply route daily.     Past Medical History  Diagnosis Date  . Chest pain 11/07    cath 11/07 : LAD luminal irregularities, ramus intermediate with superior branch 70% stenosis, circumflex 50% stenosis, marginal 50% stenosis, well preserved ejection fraction. Stress test Jan 2013 : suggestion of lateral wall ischemia, likely artifact  . Hypertension     Requires 5 drug therapy and still difficult to control.  Marland Kitchen GERD (gastroesophageal reflux disease)   . Hyperlipidemia     On daily statin  . Obesity     Morbid. HAs worked with [name] T previously.  . Colonic polyp 1995    Colonoscopies 1994, 2000, and 02/02/2011. Last had 9 polyps - Tubular adenoma and tubulovillous was the pathology results without high grade dysplasia  . Chronic pain     MR 08/07/10 :  Spondylosis L4-5 with B foraminal narrowing and L4 nerve root enchroachment B.  . Seasonal allergies     seasonal  . Iron deficiency anemia     Ferritin was 12 in 2012. on FeSo4. Has had colon and EGD 02/02/11 that showed mild gastritis and colon polyps.  . Chronic venous insufficiency 2012    Has had full W/U incl ECHO, LFT's, Cr, Umicro, TSH, BNP. Symptomatic treatment.  . Diastolic CHF, chronic ([place])     NL EF by echo 01/2012, Gr I dd  . Arthritis     knees  . 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 S/p  lumpectomy and radiation.  . Diabetes mellitus type II     Insulin dependent. Has worked with [name] R previously.  . OSA (obstructive sleep apnea) 02/16/2007    02/2007 : Moderate AHI 35.6, O2 sat decreased to 65%, CPAP titration to 16 with AHI of 3.6. 02/2014 : Rare respiratory events with sleep disturbance, within normal limits. AHI 0.4 per hour        Past Surgical History  Procedure Laterality Date  . Colonoscopy w/ polypectomy  1994, 2000, 2012  . Total abdominal hysterectomy  1995    2/2 uterine cancer  . Carotid dopplers  08/24/2007    Normal. Done 2/2 dizziness.  . Cardiac catheterization  2007    Non  obstructing CAD - [place]  . Breast lumpectomy w/ needle localization  2006    34 Chemo RX    Family History  Problem Relation Age of Onset  . Depression Daughter 67  . Osteoarthritis Maternal Grandmother   . Lung cancer Mother   . Diabetes Father   . Breast cancer Sister   . Pulmonary embolism Brother   . Colon cancer Neg Hx   . Heart attack Brother 55   Social History:  reports that she has never smoked. She has never used smokeless tobacco. She reports that she does not drink alcohol or use illicit drugs.  Allergies:  Allergies  Allergen Reactions  . Lisinopril Other (See Comments)    Chronic cough secondary to ACE  . Oxycodone-Acetaminophen Hives and Itching    flushing  . Atorvastatin Other (See Comments)    myalgias     (Not in a hospital admission)  Results for orders placed or performed during the hospital encounter of 08/14/15 (from the past 48 hour(s))  I-stat troponin, ED     Status: None   Collection Time: 08/14/15  6:21 PM  Result Value Ref Range   Troponin i, poc 0.00 0.00 - 0.08 ng/mL   Comment 3            Comment: Due to the release kinetics of cTnI, a negative result within the first hours of the onset of symptoms does not rule out myocardial infarction with certainty. If myocardial infarction is still suspected, repeat the test at  appropriate intervals.   Basic metabolic panel     Status: Abnormal   Collection Time: 08/14/15  6:26 PM  Result Value Ref Range   Sodium 141 135 - 145 mmol/L   Potassium 3.2 (L) 3.5 - 5.1 mmol/L   Chloride 105 101 - 111 mmol/L   CO2 25 22 - 32 mmol/L   Glucose, Bld 173 (H) 65 - 99 mg/dL   BUN 17 6 - 20 mg/dL   Creatinine, Ser 0.79 0.44 - 1.00 mg/dL   Calcium 9.7 8.9 - 10.3 mg/dL   GFR calc non Af Amer >60 >60 mL/min   GFR calc Af Amer >60 >60 mL/min    Comment: (NOTE) The eGFR has been calculated using the CKD EPI equation. This calculation has not been validated in all clinical situations. eGFR's persistently <60 mL/min signify possible Chronic Kidney Disease.    Anion gap 11 5 - 15  CBC     Status: Abnormal   Collection Time: 08/14/15  6:26 PM  Result Value Ref Range   WBC 6.4 4.0 - 10.5 K/uL   RBC 4.08 3.87 - 5.11 MIL/uL   Hemoglobin 11.6 (L) 12.0 - 15.0 g/dL   HCT 35.9 (L) 36.0 - 46.0 %   MCV 88.0 78.0 - 100.0 fL   MCH 28.4 26.0 - 34.0 pg   MCHC 32.3 30.0 - 36.0 g/dL   RDW 15.6 (H) 11.5 - 15.5 %   Platelets 243 150 - 400 K/uL  Protime-INR     Status: None   Collection Time: 08/14/15  6:59 PM  Result Value Ref Range   Prothrombin Time 12.7 11.6 - 15.2 seconds   INR 0.93 0.00 - 1.49  APTT     Status: None   Collection Time: 08/14/15  6:59 PM  Result Value Ref Range   aPTT 32 24 - 37 seconds   Dg Chest Port 1 View  08/14/2015  CLINICAL DATA:  64 year old with current history of hypertension and diabetes,  chronic diastolic CHF, presenting with a 4 day history of chest pain radiating into the left jaw associated with shortness of breath. EXAM: PORTABLE CHEST 1 VIEW COMPARISON:  05/16/2012 and earlier. FINDINGS: Suboptimal inspiration accounts for crowded bronchovascular markings, especially in the bases, and accentuates the cardiac silhouette. Taking this into account, cardiac silhouette mildly enlarged. Lungs clear. Bronchovascular markings normal. Pulmonary vascularity  normal. No visible pleural effusions. No pneumothorax. IMPRESSION: Suboptimal inspiration.  No acute cardiopulmonary disease. Electronically Signed   By: Evangeline Dakin M.D.   On: 08/14/2015 18:46    Review of Systems  Constitutional: Positive for diaphoresis. Negative for fever.  HENT: Negative for congestion and sore throat.   Respiratory: Positive for shortness of breath and wheezing. Negative for cough.   Cardiovascular: Positive for chest pain, palpitations and leg swelling. Negative for orthopnea and PND.  Gastrointestinal: Positive for nausea. Negative for vomiting, abdominal pain, blood in stool and melena.  Musculoskeletal: Positive for myalgias (Left arm pain) and neck pain.  Neurological: Positive for dizziness, weakness and headaches (from the nitroglycerin).  All other systems reviewed and are negative.   Blood pressure 171/67, pulse 73, temperature 98.4 F (36.9 C), temperature source Oral, resp. rate 15, height $RemoveBe'5\' 1"'KTlCOdpIZ$  (1.549 m), weight 208 lb (94.348 kg), last menstrual period 07/12/1984, SpO2 100 %. Physical Exam  Nursing note and vitals reviewed.   Obese, well developed, in no acute distress HEENT: Pupils are equal round react to light accommodation extraocular movements are intact.  Neck: No cervical lymphadenopathy. Cardiac: Regular rate and rhythm without murmurs rubs or gallops. Lungs:  clear to auscultation bilaterally, no wheezing, rhonchi or rales Abd: soft, nontender, positive bowel sounds all quadrants Ext: Trace lower extremity edema.  2+ radial and dorsalis pedis pulses. Skin: warm and dry Neuro:  Grossly normal   Assessment/Plan Principal Problem:   Unstable angina (HCC) Active Problems:   CAD (coronary artery disease)   Benign essential HTN   Hyperlipidemia   OSA (obstructive sleep apnea)   Obesity   Prolonged Q-T interval on ECG   Hypokalemia   First deg AVB  Patient will be admitted for observation. Her symptoms do sound like they could be  unstable angina.  She had some response to the EMS nitroglycerin.  Initial troponin was negative.  EKG shows no acute ischemic changes however, does indicate a prolonged QTc.  Check Magnesium.  Replace potassium.  Cycle troponin.   Stress vs. Left heart cath.    Tarri Fuller, [place] 08/14/2015, 7:29 PM   Attending note:  Patient seen and examined. Reviewed records and discussed the case with Mr. Lucia Gaskins. [name] is a patient of Dr. Percival Spanish, last seen in March of this year. She has a history of moderate CAD involving the ramus intermedius and circumflex diagnosed back in 2007, with follow-up Myoview in 2013 indicating possible lateral wall ischemia versus artifact. She has been managed medically including as needed use of both sublingual nitroglycerin and nitroglycerin patch at home. She was due to see Dr. Percival Spanish tomorrow for a preoperative evaluation. She has a large subcutaneous cyst on her right lateral thorax, has had intermittent infections requiring drainage, and plans to has this resected by [facility] under general anesthesia. Surgery is not yet scheduled.  She presented to the ER today stating that since Sunday she has been having worsening chest discomfort. Today she had a particularly bad episode of severe left arm discomfort radiating into the left chest and up into her neck. She was diaphoretic and short  of breath with these symptoms. She took both analgesics and nitroglycerin without much relief, although ultimately when she had fresh nitroglycerin per EMS, her symptoms did improve. She states that this pattern has been escalating since Sunday and she has been using nitroglycerin a few times a day.  On examination she appears comfortable now, blood pressure 171/67, heart rate in the 70s. She is of short stature and obese, lungs are clear with decreased breath sounds. There is a hyperpigmented area on her right lateral thorax in the region of the subcutaneous cyst, not draining currently.  Cardiac exam reveals RRR with indistinct PMI distant heart sounds.  Lab work reviewed. Potassium 3.2, magnesium 1.9, initial troponin I 0.00, hemoglobin 11.6, platelets 243, INR 0.93. Chest x-ray shows no acute process with suboptimal inspiration. ECG shows sinus rhythm with prolonged PR interval and QTc, nonspecific anterolateral ST-T wave abnormalities.  Situation discussed with [name]. Recommendation is admission to the hospital to cycle further cardiac markers and an anticipated diagnostic radial approach cardiac catheterization tomorrow for reevaluation of coronary anatomy in light of her worsening symptoms concerning for unstable angina. This will also further assist in her preoperative evaluation. If she does end up requiring a percutaneous intervention with stent placement, this will likely defer her cyst removal however due to need for dual antiplatelet therapy.  Satira Sark, M.D., F.A.C.C.

## 2015-08-14 NOTE — ED Notes (Signed)
Cardiologist at  Bedside. 

## 2015-08-14 NOTE — ED Provider Notes (Signed)
CSN: [id number]     Arrival date & time 08/14/15  1757 History   First MD Initiated Contact with Patient 08/14/15 1802     Chief Complaint  Patient presents with  . Chest Pain     (Consider location/radiation/quality/duration/timing/severity/associated sxs/prior Treatment) HPI   [name] Is a 64 year old female with a past medical history of hypertension, hyperlipidemia,DM, and known coronary artery disease. She has had 4 days of Intermittent chest pain, Left arm pain, chest pressure, left jaw pain and sob. Pain is non exertional. It has been intermittently relieved by her SL nitro and ASA. Today her pain became unbearable and states that she could not wait for her cardiology appointment tomorrow with Dr. Percival Spanish.  She denies nausea, diaphoresis.  Past Medical History  Diagnosis Date  . Chest pain 11/07    cath 11/07 : LAD luminal irregularities, ramus intermediate with superior branch 70% stenosis, circumflex 50% stenosis, marginal 50% stenosis, well preserved ejection fraction. Stress test Jan 2013 : suggestion of lateral wall ischemia, likely artifact  . Hypertension     Requires 5 drug therapy and still difficult to control.  Marland Kitchen GERD (gastroesophageal reflux disease)   . Hyperlipidemia     On daily statin  . Obesity     Morbid. HAs worked with [name] T previously.  . Colonic polyp 1995    Colonoscopies 1994, 2000, and 02/02/2011. Last had 9 polyps - Tubular adenoma and tubulovillous was the pathology results without high grade dysplasia  . Chronic pain     MR 08/07/10 :  Spondylosis L4-5 with B foraminal narrowing and L4 nerve root enchroachment B.  . Seasonal allergies     seasonal  . Iron deficiency anemia     Ferritin was 12 in 2012. on FeSo4. Has had colon and EGD 02/02/11 that showed mild gastritis and colon polyps.  . Chronic venous insufficiency 2012    Has had full W/U incl ECHO, LFT's, Cr, Umicro, TSH, BNP. Symptomatic treatment.  . Diastolic CHF, chronic ([place])      NL EF by echo 01/2012, Gr I dd  . Arthritis     knees  . 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 S/p lumpectomy and radiation.  . Diabetes mellitus type II     Insulin dependent. Has worked with [name] R previously.  . OSA (obstructive sleep apnea) 02/16/2007    02/2007 : Moderate AHI 35.6, O2 sat decreased to 65%, CPAP titration to 16 with AHI of 3.6. 02/2014 : Rare respiratory events with sleep disturbance, within normal limits. AHI 0.4 per hour       Past Surgical History  Procedure Laterality Date  . Colonoscopy w/ polypectomy  1994, 2000, 2012  . Total abdominal hysterectomy  1995    2/2 uterine cancer  . Carotid dopplers  08/24/2007    Normal. Done 2/2 dizziness.  . Cardiac catheterization  2007    Non obstructing CAD - [place]  . Breast lumpectomy w/ needle localization  2006    34 Chemo RX   Family History  Problem Relation Age of Onset  . Depression Daughter 3  . Osteoarthritis Maternal Grandmother   . Lung cancer Mother   . Diabetes Father   . Breast cancer Sister   . Pulmonary embolism Brother   . Colon cancer Neg Hx   . Heart attack Brother 31   Social History  Substance Use Topics  . Smoking status: Never Smoker   .  Smokeless tobacco: Never Used  . Alcohol Use: No   OB History    No data available     Review of Systems  Ten systems reviewed and are negative for acute change, except as noted in the HPI.    Allergies  Lisinopril; Oxycodone-acetaminophen; and Atorvastatin  Home Medications   Prior to Admission medications   Medication Sig Start Date End Date Taking? Authorizing Provider  ACCU-CHEK FASTCLIX LANCETS MISC USE ONE LANCET THREE TIMES DAILY BEFORE MEALS AND BEFORE BED. DX CODE: E11.40 08/05/15   Bartholomew Crews, MD  amLODipine (NORVASC) 10 MG tablet TAKE ONE (1) TABLET BY MOUTH EVERY DAY 01/06/15   Bartholomew Crews, MD  aspirin EC 81 MG tablet Take 81 mg by mouth daily.    Historical  Provider, MD  Bilberry, Vaccinium myrtillus, (BILBERRY PO) Take 1,000 mg by mouth daily.      Historical Provider, MD  Calcium-Magnesium-Vitamin D (CALCIUM MAGNESIUM PO) Take 1 tablet by mouth daily.    Historical Provider, MD  cimetidine (TAGAMET) 200 MG tablet Take 200 mg by mouth 2 (two) times daily.    Historical Provider, MD  cloNIDine (CATAPRES) 0.2 MG tablet Take 1 tablet (0.2 mg total) by mouth 2 (two) times daily. 01/17/15   Bartholomew Crews, MD  docusate sodium (COLACE) 100 MG capsule Take 100 mg by mouth 2 (two) times daily.    Historical Provider, MD  furosemide (LASIX) 20 MG tablet Take 1 tablet (20 mg total) by mouth daily as needed. 03/27/15   Bartholomew Crews, MD  gabapentin (NEURONTIN) 300 MG capsule TAKE ONE (1) CAPSULE BY MOUTH 2 TIMES DAILY 01/06/15   Bartholomew Crews, MD  glucose 4 GM chewable tablet Chew 4 tablets (16 g total) by mouth as needed for low blood sugar. 01/22/13   Maitri S Kalia-Reynolds, DO  glucose blood (ACCU-CHEK SMARTVIEW) test strip Check blood sugar up to 3 times daily  Dx code E11.40. Insulin dependent 03/31/15   Bartholomew Crews, MD  hydrocortisone 2.5 % lotion Apply to affected area 2 times daily. 06/27/15 06/26/16  Ejiroghene Arlyce Dice, MD  ibuprofen (ADVIL,MOTRIN) 800 MG tablet Take 1 tablet (800 mg total) by mouth every 8 (eight) hours as needed. For pain 01/06/15   Bartholomew Crews, MD  Insulin Isophane & Regular Human (HUMULIN 70/30 MIX) (70-30) 100 UNIT/ML PEN Inject 24 units before morning meal and 17 units before evening meal. 05/05/15   Bartholomew Crews, MD  Insulin Syringe-Needle U-100 31G X 15/64" 0.5 ML MISC 1 applicator by Does not apply route 2 (two) times daily. Dx code E11.40 12/12/14   Bartholomew Crews, MD  IRON PO Take 27 mg by mouth daily.    Historical Provider, MD  loratadine (CLARITIN) 10 MG tablet TAKE ONE TABLET BY MOUTH ONCE DAILY FOR ALLERGIES 05/08/13   Bartholomew Crews, MD  losartan (COZAAR) 50 MG tablet TAKE ONE (1)  TABLET BY MOUTH EVERY DAY 01/06/15   Bartholomew Crews, MD  metFORMIN (GLUCOPHAGE) 1000 MG tablet TAKE ONE (1) TABLET BY MOUTH TWO (2) TIMES DAILY WITH A MEAL 04/04/15   Bartholomew Crews, MD  metoprolol succinate (TOPROL-XL) 100 MG 24 hr tablet Take 1 tablet (100 mg total) by mouth daily. 01/06/15   Bartholomew Crews, MD  mometasone (NASONEX) 50 MCG/ACT nasal spray Place 2 sprays into the nose daily. 01/29/14 01/29/15  Bartholomew Crews, MD  Multiple Vitamins-Minerals (WOMENS ONE DAILY) TABS Take 1 tablet by mouth daily.  Historical Provider, MD  nitroGLYCERIN (NITRODUR - DOSED IN MG/24 HR) 0.4 mg/hr patch Place 1 patch (0.4 mg total) onto the skin daily. Leave on for 12 hours then remove for 12 hours before placing new patch. 01/03/15   Minus Breeding, MD  nitroGLYCERIN (NITROSTAT) 0.4 MG SL tablet Place 1 tablet (0.4 mg total) under the tongue every 5 (five) minutes as needed for chest pain. 01/06/15 12/23/16  Bartholomew Crews, MD  Nutritional Supplements (GRAPESEED EXTRACT PO) Take 650 mg by mouth daily.    Historical Provider, MD  Olopatadine HCl (PATADAY) 0.2 % SOLN Place 1 drop into both eyes daily as needed. For allergies    Historical Provider, MD  polyethylene glycol powder (GLYCOLAX/MIRALAX) powder Take 17 g by mouth daily as needed. For constipation    Historical Provider, MD  Potassium 99 MG TABS Take 99 mg by mouth daily.    Historical Provider, MD  senna (SENOKOT) 8.6 MG TABS tablet Take 2 tablets (17.2 mg total) by mouth daily. Patient not taking: Reported on 06/12/2015 01/29/14   Bartholomew Crews, MD  traMADol (ULTRAM) 50 MG tablet Take 1 tablet (50 mg total) by mouth every 6 (six) hours as needed. 01/06/15 01/06/16  Bartholomew Crews, MD  Turmeric, Lear Ng, POWD 1 scoop by Does not apply route daily.    Historical Provider, MD   LMP 07/12/1984 Physical Exam  Constitutional: She is oriented to person, place, and time. She appears well-developed and well-nourished. No distress.   Appears uncomfortable  HENT:  Head: Normocephalic and atraumatic.  Eyes: Conjunctivae are normal. No scleral icterus.  Neck: Normal range of motion.  Cardiovascular: Normal rate, regular rhythm and normal heart sounds.  Exam reveals no gallop and no friction rub.   No murmur heard. Pulmonary/Chest: Effort normal and breath sounds normal. No respiratory distress.  Abdominal: Soft. Bowel sounds are normal. She exhibits no distension and no mass. There is no tenderness. There is no guarding.  Neurological: She is alert and oriented to person, place, and time.  Skin: Skin is warm and dry. She is not diaphoretic.  Nursing note and vitals reviewed.   ED Course  Procedures (including critical care time) Labs Review Labs Reviewed - No data to display  Imaging Review No results found. I have personally reviewed and evaluated these images and lab results as part of my medical decision-making.   EKG Interpretation   Date/Time:  Thursday August 14 2015 17:58:18 EST Ventricular Rate:  73 PR Interval:  224 QRS Duration: 84 QT Interval:  543 QTC Calculation: 598 R Axis:   41 Text Interpretation:  Sinus rhythm Prolonged PR interval Borderline abnrm  T, anterolateral leads Prolonged QT interval Abnormal ekg Confirmed by  BEATON  MD, ROBERT ([id number]) on 08/14/2015 6:05:18 PM      MDM   Final diagnoses:  None    6:47 PM BP 142/63 mmHg  Pulse 64  Temp(Src) 98.4 F (36.9 C) (Oral)  Resp 15  Ht 5\' 1"  (1.549 m)  Wt 208 lb (94.348 kg)  BMI 39.32 kg/m2  SpO2 100%  LMP 07/12/1984 Patient with htn.  Abnormal EKG, no signs of active ischemia. Her troponin is negative.  I have spoken with the cardiologist on call  Who will see the patient here in the ED. Concern for UA.  Patient to be admitted by Cardiology. Plan to cycle enzymes and cath tomorrow. Pt stable in ED with no significant deterioration in condition.   Margarita Mail, PA-C 08/14/15 2030  Herbie Baltimore  Audie Pinto,  MD 08/14/15 2123

## 2015-08-15 ENCOUNTER — Encounter (HOSPITAL_COMMUNITY): Payer: Self-pay | Admitting: Cardiovascular Disease

## 2015-08-15 ENCOUNTER — Encounter: Payer: Self-pay | Admitting: Internal Medicine

## 2015-08-15 ENCOUNTER — Encounter (HOSPITAL_COMMUNITY)
Admission: EM | Disposition: A | Discharge status: Home / Self Care | Payer: Medicare Other | Source: Home / Self Care | Attending: Emergency Medicine

## 2015-08-15 ENCOUNTER — Ambulatory Visit: Payer: Medicare Other | Admitting: Cardiology

## 2015-08-15 DIAGNOSIS — I10 Essential (primary) hypertension: Secondary | ICD-10-CM | POA: Diagnosis not present

## 2015-08-15 DIAGNOSIS — I110 Hypertensive heart disease with heart failure: Secondary | ICD-10-CM | POA: Diagnosis not present

## 2015-08-15 DIAGNOSIS — I2584 Coronary atherosclerosis due to calcified coronary lesion: Secondary | ICD-10-CM | POA: Diagnosis not present

## 2015-08-15 DIAGNOSIS — K219 Gastro-esophageal reflux disease without esophagitis: Secondary | ICD-10-CM | POA: Diagnosis not present

## 2015-08-15 DIAGNOSIS — I25110 Atherosclerotic heart disease of native coronary artery with unstable angina pectoris: Secondary | ICD-10-CM | POA: Diagnosis not present

## 2015-08-15 DIAGNOSIS — R7989 Other specified abnormal findings of blood chemistry: Secondary | ICD-10-CM | POA: Insufficient documentation

## 2015-08-15 DIAGNOSIS — E785 Hyperlipidemia, unspecified: Secondary | ICD-10-CM | POA: Diagnosis not present

## 2015-08-15 DIAGNOSIS — I200 Unstable angina: Secondary | ICD-10-CM | POA: Diagnosis not present

## 2015-08-15 HISTORY — PX: CARDIAC CATHETERIZATION: SHX172

## 2015-08-15 LAB — CBC
HCT: 35 % — ABNORMAL LOW (ref 36.0–46.0)
HEMOGLOBIN: 11.4 g/dL — AB (ref 12.0–15.0)
MCH: 28.3 pg (ref 26.0–34.0)
MCHC: 32.6 g/dL (ref 30.0–36.0)
MCV: 86.8 fL (ref 78.0–100.0)
Platelets: 224 10*3/uL (ref 150–400)
RBC: 4.03 MIL/uL (ref 3.87–5.11)
RDW: 15.5 % (ref 11.5–15.5)
WBC: 5.9 10*3/uL (ref 4.0–10.5)

## 2015-08-15 LAB — BASIC METABOLIC PANEL
ANION GAP: 8 (ref 5–15)
BUN: 12 mg/dL (ref 6–20)
CHLORIDE: 107 mmol/L (ref 101–111)
CO2: 25 mmol/L (ref 22–32)
Calcium: 9.3 mg/dL (ref 8.9–10.3)
Creatinine, Ser: 0.68 mg/dL (ref 0.44–1.00)
GFR calc Af Amer: >60 mL/min (ref >60)
GLUCOSE: 163 mg/dL — AB (ref 65–99)
POTASSIUM: 3.9 mmol/L (ref 3.5–5.1)
SODIUM: 140 mmol/L (ref 135–145)

## 2015-08-15 LAB — TROPONIN I
Troponin I: <0.03 ng/mL (ref <0.031)
Troponin I: <0.03 ng/mL (ref <0.031)

## 2015-08-15 LAB — GLUCOSE, CAPILLARY
GLUCOSE-CAPILLARY: 139 mg/dL — AB (ref 65–99)
GLUCOSE-CAPILLARY: 128 mg/dL — AB (ref 65–99)
GLUCOSE-CAPILLARY: 182 mg/dL — AB (ref 65–99)
Glucose-Capillary: 107 mg/dL — ABNORMAL HIGH (ref 65–99)

## 2015-08-15 LAB — TSH: TSH: 8.577 u[IU]/mL — ABNORMAL HIGH (ref 0.350–4.500)

## 2015-08-15 LAB — CREATININE, SERUM: CREATININE: 0.65 mg/dL (ref 0.44–1.00)

## 2015-08-15 SURGERY — LEFT HEART CATH AND CORONARY ANGIOGRAPHY
Anesthesia: LOCAL

## 2015-08-15 MED ORDER — MIDAZOLAM HCL 2 MG/2ML IJ SOLN
INTRAMUSCULAR | Status: AC
  Filled 2015-08-15: qty 4

## 2015-08-15 MED ORDER — MIDAZOLAM HCL 2 MG/2ML IJ SOLN
INTRAMUSCULAR | Status: DC | PRN
  Administered 2015-08-15: 2 mg via INTRAVENOUS

## 2015-08-15 MED ORDER — ACETAMINOPHEN 325 MG PO TABS
650.0000 mg | ORAL_TABLET | ORAL | Status: DC | PRN
  Administered 2015-08-15: 650 mg via ORAL
  Filled 2015-08-15: qty 2

## 2015-08-15 MED ORDER — FENTANYL CITRATE (PF) 100 MCG/2ML IJ SOLN
INTRAMUSCULAR | Status: DC | PRN
  Administered 2015-08-15: 50 ug via INTRAVENOUS

## 2015-08-15 MED ORDER — ONDANSETRON HCL 4 MG/2ML IJ SOLN
4.0000 mg | Freq: Four times a day (QID) | INTRAMUSCULAR | Status: DC | PRN
  Administered 2015-08-15: 4 mg via INTRAVENOUS

## 2015-08-15 MED ORDER — FENTANYL CITRATE (PF) 100 MCG/2ML IJ SOLN
INTRAMUSCULAR | Status: AC
  Filled 2015-08-15: qty 4

## 2015-08-15 MED ORDER — VERAPAMIL HCL 2.5 MG/ML IV SOLN
INTRA_ARTERIAL | Status: DC | PRN
  Administered 2015-08-15: 15 mL via INTRA_ARTERIAL

## 2015-08-15 MED ORDER — HEPARIN SODIUM (PORCINE) 1000 UNIT/ML IJ SOLN
INTRAMUSCULAR | Status: DC | PRN
  Administered 2015-08-15: 5500 [IU] via INTRAVENOUS

## 2015-08-15 MED ORDER — LIDOCAINE HCL (PF) 1 % IJ SOLN
INTRAMUSCULAR | Status: AC
  Filled 2015-08-15: qty 30

## 2015-08-15 MED ORDER — NITROGLYCERIN 1 MG/10 ML FOR IR/CATH LAB
INTRA_ARTERIAL | Status: DC | PRN
  Administered 2015-08-15: 200 ug via INTRACORONARY

## 2015-08-15 MED ORDER — HEPARIN (PORCINE) IN NACL 2-0.9 UNIT/ML-% IJ SOLN
INTRAMUSCULAR | Status: AC
  Filled 2015-08-15: qty 1000

## 2015-08-15 MED ORDER — EZETIMIBE 10 MG PO TABS
10.0000 mg | ORAL_TABLET | Freq: Every day | ORAL | Status: DC
Start: 2015-08-15 — End: 2015-08-17
  Administered 2015-08-15 – 2015-08-17 (×3): 10 mg via ORAL
  Filled 2015-08-15 (×4): qty 1

## 2015-08-15 MED ORDER — SODIUM CHLORIDE 0.9 % IV SOLN: 250.0000 mL | INTRAVENOUS | Status: DC | PRN

## 2015-08-15 MED ORDER — NITROGLYCERIN 1 MG/10 ML FOR IR/CATH LAB
INTRA_ARTERIAL | Status: AC
  Filled 2015-08-15: qty 10

## 2015-08-15 MED ORDER — SODIUM CHLORIDE 0.9 % IJ SOLN: 3.0000 mL | INTRAMUSCULAR | Status: DC | PRN

## 2015-08-15 MED ORDER — SODIUM CHLORIDE 0.9 % IV SOLN: INTRAVENOUS | Status: AC

## 2015-08-15 MED ORDER — SODIUM CHLORIDE 0.9 % IJ SOLN
3.0000 mL | Freq: Two times a day (BID) | INTRAMUSCULAR | Status: DC
  Administered 2015-08-15 – 2015-08-16 (×2): 3 mL via INTRAVENOUS

## 2015-08-15 MED ORDER — VERAPAMIL HCL 2.5 MG/ML IV SOLN
INTRAVENOUS | Status: AC
  Filled 2015-08-15: qty 2

## 2015-08-15 MED ORDER — HEPARIN SODIUM (PORCINE) 1000 UNIT/ML IJ SOLN
INTRAMUSCULAR | Status: AC
  Filled 2015-08-15: qty 1

## 2015-08-15 MED ORDER — HEPARIN SODIUM (PORCINE) 5000 UNIT/ML IJ SOLN
5000.0000 [IU] | Freq: Three times a day (TID) | INTRAMUSCULAR | Status: DC
  Administered 2015-08-16 (×2): 5000 [IU] via SUBCUTANEOUS
  Filled 2015-08-15 (×2): qty 1

## 2015-08-15 MED ORDER — RANOLAZINE ER 500 MG PO TB12: 500.0000 mg | ORAL_TABLET | Freq: Two times a day (BID) | ORAL | Status: DC

## 2015-08-15 MED ORDER — ISOSORBIDE MONONITRATE ER 60 MG PO TB24
60.0000 mg | ORAL_TABLET | Freq: Every day | ORAL | Status: DC
  Administered 2015-08-15 – 2015-08-17 (×3): 60 mg via ORAL
  Filled 2015-08-15 (×3): qty 1

## 2015-08-15 SURGICAL SUPPLY — 11 items
CATH INFINITI 5FR ANG PIGTAIL (CATHETERS) ×2 IMPLANT
CATH OPTITORQUE TIG 4.0 5F (CATHETERS) ×2 IMPLANT
DEVICE RAD COMP TR BAND LRG (VASCULAR PRODUCTS) ×2 IMPLANT
GLIDESHEATH SLEND A-KIT 6F 22G (SHEATH) ×2 IMPLANT
GLIDESHEATH SLEND SS 6F .021 (SHEATH) ×1 IMPLANT
KIT HEART LEFT (KITS) ×2 IMPLANT
PACK CARDIAC CATHETERIZATION (CUSTOM PROCEDURE TRAY) ×2 IMPLANT
SYR MEDRAD MARK V 150ML (SYRINGE) ×2 IMPLANT
TRANSDUCER W/STOPCOCK (MISCELLANEOUS) ×2 IMPLANT
TUBING CIL FLEX 10 FLL-RA (TUBING) ×2 IMPLANT
WIRE SAFE-T 1.5MM-J .035X260CM (WIRE) ×2 IMPLANT

## 2015-08-15 NOTE — Progress Notes (Signed)
    Subjective:  Mild indigestion; no chest pain similar to admission   Objective:  Filed Vitals:   08/15/15 0834 08/15/15 0839 08/15/15 0844 08/15/15 0849  BP: 169/80     Pulse: 0 0 0 0  Temp:      TempSrc:      Resp: 33 0 0 0  Height:      Weight:      SpO2: 0% 0% 0% 0%    Intake/Output from previous day:  Intake/Output Summary (Last 24 hours) at 08/15/15 0945 Last data filed at 08/15/15 0540  Gross per 24 hour  Intake    240 ml  Output    700 ml  Net   -460 ml    Physical Exam: Physical exam: Well-developed obese in no acute distress.  Skin is warm and dry.  HEENT is normal.  Neck is supple. Chest is clear to auscultation with normal expansion.  Cardiovascular exam is regular rate and rhythm.  Abdominal exam nontender or distended. No masses palpated. Extremities show no edema. Radial cath site with clamp in place neuro grossly intact    Lab Results: Basic Metabolic Panel:  Recent Labs  08/14/15 1826 08/14/15 1930 08/15/15 0448  NA 141  --  140  K 3.2*  --  3.9  CL 105  --  107  CO2 25  --  25  GLUCOSE 173*  --  163*  BUN 17  --  12  CREATININE 0.79  --  0.68  CALCIUM 9.7  --  9.3  MG  --  1.9  --    CBC:  Recent Labs  08/14/15 1826  WBC 6.4  HGB 11.6*  HCT 35.9*  MCV 88.0  PLT 243   Cardiac Enzymes:  Recent Labs  08/15/15 0030 08/15/15 0448  TROPONINI <0.03 <0.03     Assessment/Plan:  1 chest pain-patient ruled out. Cardiac catheterization results noted. Plan medical therapy. Consider CABG for recurrent symptoms despite medications. Continue aspirin, metoprolol and amlodipine. Nitrates added. Will avoid ranexa and prolonged QT interval on admission. Patient intolerant to statins. Zetia added. Plan ambulate today and discharge tomorrow morning if stable. 2 hyperlipidemia-Intolerant to statins. Add Zetia. 3 hypertension-continue present blood pressure medications. 4 elevated TSH-patient will need follow-up with her primary care  following discharge for further management. Kirk Ruths 08/15/2015, 9:45 AM

## 2015-08-15 NOTE — Care Management Obs Status (Signed)
[facility] NOTIFICATION   Patient Details  Name: [name] MRN: [id number] Date of Birth: 25-Nov-1950   Medicare Observation Status Notification Given:  Yes    Bethena Roys, RN 08/15/2015, 3:37 PM

## 2015-08-15 NOTE — Interval H&P Note (Signed)
Cath Lab Visit (complete for each Cath Lab visit)  Clinical Evaluation Leading to the Procedure:   ACS: No.  Non-ACS:    Anginal Classification: CCS III  Anti-ischemic medical therapy: Maximal Therapy (2 or more classes of medications)  Non-Invasive Test Results: No non-invasive testing performed  Prior CABG: No previous CABG      History and Physical Interval Note:  08/15/2015 7:45 AM  [name]  has presented today for surgery, with the diagnosis of unstable angina  The various methods of treatment have been discussed with the patient and family. After consideration of risks, benefits and other options for treatment, the patient has consented to  Procedure(s): Left Heart Cath and Coronary Angiography (N/A) as a surgical intervention .  The patient's history has been reviewed, patient examined, no change in status, stable for surgery.  I have reviewed the patient's chart and labs.  Questions were answered to the patient's satisfaction.     Dalen Hennessee A

## 2015-08-16 ENCOUNTER — Encounter (HOSPITAL_COMMUNITY): Payer: Self-pay | Admitting: Internal Medicine

## 2015-08-16 DIAGNOSIS — I25110 Atherosclerotic heart disease of native coronary artery with unstable angina pectoris: Secondary | ICD-10-CM | POA: Diagnosis not present

## 2015-08-16 DIAGNOSIS — R002 Palpitations: Secondary | ICD-10-CM | POA: Diagnosis not present

## 2015-08-16 DIAGNOSIS — I110 Hypertensive heart disease with heart failure: Secondary | ICD-10-CM | POA: Diagnosis not present

## 2015-08-16 DIAGNOSIS — K219 Gastro-esophageal reflux disease without esophagitis: Secondary | ICD-10-CM | POA: Diagnosis not present

## 2015-08-16 DIAGNOSIS — I2584 Coronary atherosclerosis due to calcified coronary lesion: Secondary | ICD-10-CM | POA: Diagnosis not present

## 2015-08-16 DIAGNOSIS — I200 Unstable angina: Secondary | ICD-10-CM | POA: Diagnosis not present

## 2015-08-16 LAB — GLUCOSE, CAPILLARY
GLUCOSE-CAPILLARY: 135 mg/dL — AB (ref 65–99)
Glucose-Capillary: 144 mg/dL — ABNORMAL HIGH (ref 65–99)
Glucose-Capillary: 133 mg/dL — ABNORMAL HIGH (ref 65–99)
Glucose-Capillary: 197 mg/dL — ABNORMAL HIGH (ref 65–99)

## 2015-08-16 LAB — HEMOGLOBIN A1C
Hgb A1c MFr Bld: 8.6 % — ABNORMAL HIGH (ref 4.8–5.6)
Mean Plasma Glucose: 200 mg/dL

## 2015-08-16 MED ORDER — RANOLAZINE ER 500 MG PO TB12
500.0000 mg | ORAL_TABLET | Freq: Two times a day (BID) | ORAL | Status: DC
  Administered 2015-08-16 – 2015-08-17 (×2): 500 mg via ORAL
  Filled 2015-08-16 (×3): qty 1

## 2015-08-16 NOTE — Progress Notes (Signed)
Cardiology [facility]    Patient Name: [name]      SUBJECTIVE:   Cath >>  Prox RCA lesion, 70% stenosed.  Mid RCA lesion, 30% stenosed.  1st Mrg lesion, 80% stenosed.  Mid LAD lesion, 70% stenosed.  Dist LAD lesion, 70% stenosed.  Dist Cx-1 lesion, 70% stenosed.  Dist Cx-2 lesion, 60% stenosed.  There is mild left ventricular systolic dysfunction.  Low-normal LV functio   Medical therapy recommended  No interval chest pain. She describes this as sharp left shoulder pain which is associated with residual fatigue and is responsive to nitrates. Question anginal equivalent. She also describes "episodes of gallop being" of her heart beat that her family and she can actually hear her rapid racing heart. This is associated also with chest pain and fatigue.  No chest pain overnight.  Past Medical History  Diagnosis Date  . CAD (coronary artery disease) 11/07    cath 11/11 :     Prox RCA lesion, 70% stenosed.  . Essential hypertension     Requires 5 drug therapy and still difficult to control.  Marland Kitchen GERD (gastroesophageal reflux disease)   . Hyperlipidemia     On daily statin  . Obesity     Morbid. HAs worked with [name] T previously.  . Colonic polyp 1995    Colonoscopies 1994, 2000, and 02/02/2011. Last had 9 polyps - Tubular adenoma and tubulovillous was the pathology results without high grade dysplasia  . Chronic pain     MR 08/07/10 :  Spondylosis L4-5 with B foraminal narrowing and L4 nerve root enchroachment B.  . Seasonal allergies     seasonal  . Iron deficiency anemia     Ferritin was 12 in 2012. on FeSo4. Has had colon and EGD 02/02/11 that showed mild gastritis and colon polyps.  . Chronic venous insufficiency 2012    Has had full W/U incl ECHO, LFT's, Cr, Umicro, TSH, BNP. Symptomatic treatment.  . Diastolic CHF, chronic ([place])     NL EF by echo 01/2012, Gr I dd  . Arthritis     Knees  . Breast cancer ([place]) 2006    L ductal carcinoma in situ with  papillary features, high grade. S/p lumpectomy and radiation.  . Diabetes mellitus type II     Insulin dependent. Has worked with [name] R previously.  . OSA (obstructive sleep apnea) 02/16/2007    02/2007 : Moderate AHI 35.6, O2 sat decreased to 65%, CPAP titration to 16 with AHI of 3.6. 02/2014 : Rare respiratory events with sleep disturbance, within normal limits. AHI 0.4 per hour        Scheduled Meds:  Scheduled Meds: . amLODipine  10 mg Oral Daily  . aspirin EC  81 mg Oral Daily  . cloNIDine  0.2 mg Oral BID  . ezetimibe  10 mg Oral Daily  . gabapentin  300 mg Oral BID  . heparin  5,000 Units Subcutaneous 3 times per day  . insulin aspart  0-20 Units Subcutaneous TID WC  . isosorbide mononitrate  60 mg Oral Daily  . losartan  50 mg Oral Daily  . metoprolol succinate  100 mg Oral Daily  . sodium chloride  3 mL Intravenous Q12H   Continuous Infusions: . sodium chloride 10 mL/hr at 08/14/15 2153   sodium chloride, acetaminophen, nitroGLYCERIN, ondansetron (ZOFRAN) IV, sodium chloride, traMADol    PHYSICAL EXAM Filed Vitals:   08/15/15 2108 08/16/15 0500 08/16/15 0505 08/16/15 0626  BP:  94/51 154/45  Pulse:   63   Temp: 98.2 F (36.8 C)  97.9 F (36.6 C)   TempSrc: Oral  Oral   Resp:   20   Height:      Weight:  209 lb 4.8 oz (94.938 kg)    SpO2:   94%    Well developed and nourished in no acute distress HENT normal Neck supple with JVP-8-10  Clear Regular rate and rhythm, no murmurs or gallops Abd-soft with active BS No Clubbing cyanosis edema Skin-warm and dry A & Oriented  Grossly normal sensory and motor function   TELEMETRY: Reviewed telemetry pt in sinus     Intake/Output Summary (Last 24 hours) at 08/16/15 0904 Last data filed at 08/16/15 0513  Gross per 24 hour  Intake   1970 ml  Output   1200 ml  Net    770 ml    LABS: Basic Metabolic Panel:  Recent Labs Lab 08/14/15 1826 08/14/15 1930 08/15/15 0448 08/15/15 1145  NA 141  --  140  --    K 3.2*  --  3.9  --   CL 105  --  107  --   CO2 25  --  25  --   GLUCOSE 173*  --  163*  --   BUN 17  --  12  --   CREATININE 0.79  --  0.68 0.65  CALCIUM 9.7  --  9.3  --   MG  --  1.9  --   --    Cardiac Enzymes:  Recent Labs  08/15/15 0030 08/15/15 0448 08/15/15 1145  TROPONINI <0.03 <0.03 <0.03   CBC:  Recent Labs Lab 08/14/15 1826 08/15/15 1145  WBC 6.4 5.9  HGB 11.6* 11.4*  HCT 35.9* 35.0*  MCV 88.0 86.8  PLT 243 224   PROTIME:  Recent Labs  08/14/15 1859  LABPROT 12.7  INR 0.93   Liver Function Tests: Hemoglobin A1C:  Recent Labs  08/15/15 0030  HGBA1C 8.6*   Fasting Lipid Panel: No results for input(s): CHOL, HDL, LDLCALC, TRIG, CHOLHDL, LDLDIRECT in the last 72 hours. Thyroid Function Tests:  Recent Labs  08/15/15 0030  TSH 8.577*      ASSESSMENT AND PLAN:  Principal Problem:   Unstable angina (HCC) Active Problems:   CAD (coronary artery disease)   Benign essential HTN   Hyperlipidemia   OSA (obstructive sleep apnea)   Obesity   Prolonged Q-T interval on ECG   Hypokalemia  Will add back ranolazine as per Dr. Leda Gauze s suggestion. ECGs had normalized QT intervals. We'll anticipate one more day in the hospital    We have discussed the physiology of ischemia  She also has prolonged tachypalpitations "galloping". Discussed the importance of having her heart rhythm recording at that time. These episodes last 2 days so it shouldn't be a challenge.  Begin synthroid  Signed, Virl Axe MD  08/16/2015

## 2015-08-17 DIAGNOSIS — I25110 Atherosclerotic heart disease of native coronary artery with unstable angina pectoris: Secondary | ICD-10-CM | POA: Diagnosis not present

## 2015-08-17 DIAGNOSIS — I200 Unstable angina: Secondary | ICD-10-CM | POA: Diagnosis not present

## 2015-08-17 LAB — GLUCOSE, CAPILLARY: Glucose-Capillary: 154 mg/dL — ABNORMAL HIGH (ref 65–99)

## 2015-08-17 MED ORDER — EZETIMIBE 10 MG PO TABS: 10.0000 mg | ORAL_TABLET | Freq: Every day | ORAL | Status: DC

## 2015-08-17 MED ORDER — METFORMIN HCL 1000 MG PO TABS: ORAL_TABLET | ORAL | Status: DC

## 2015-08-17 MED ORDER — CLONIDINE HCL 0.1 MG PO TABS
0.1000 mg | ORAL_TABLET | Freq: Two times a day (BID) | ORAL | Status: DC
  Administered 2015-08-17: 0.1 mg via ORAL
  Filled 2015-08-17: qty 1

## 2015-08-17 MED ORDER — ISOSORBIDE MONONITRATE ER 60 MG PO TB24: 60.0000 mg | ORAL_TABLET | Freq: Every day | ORAL | Status: DC

## 2015-08-17 MED ORDER — CLONIDINE HCL 0.2 MG PO TABS: 0.1000 mg | ORAL_TABLET | Freq: Two times a day (BID) | ORAL | Status: DC

## 2015-08-17 MED ORDER — LEVOTHYROXINE SODIUM 25 MCG PO TABS: 25.0000 ug | ORAL_TABLET | Freq: Every day | ORAL | Status: DC

## 2015-08-17 NOTE — Progress Notes (Signed)
Subjective:  No shoulder or chest pain overnight. She says she had palpitations "1:47-1:55 am". She is concerned that her B/P is lower than usual. She declined her Ranexa dose because of this.   Objective:  Vital Signs in the last 24 hours: Temp:  [98.1 F (36.7 C)-98.7 F (37.1 C)] 98.1 F (36.7 C) (11/13 0502) Pulse Rate:  [58-65] 64 (11/13 0502) Resp:  [16] 16 (11/12 1500) BP: (112-135)/(47-68) 135/68 mmHg (11/13 0502) SpO2:  [93 %-100 %] 100 % (11/13 0502)  Intake/Output from previous day:  Intake/Output Summary (Last 24 hours) at 08/17/15 0804 Last data filed at 08/17/15 0500  Gross per 24 hour  Intake    240 ml  Output   1000 ml  Net   -760 ml    Physical Exam: General appearance: alert, cooperative, no distress and morbidly obese Neck: no JVD Lungs: clear to auscultation bilaterally Extremities: extremities normal, atraumatic, no cyanosis or edema Skin: Skin color, texture, turgor normal. No rashes or lesions Neurologic: Grossly normal   Rate: 64  Rhythm: normal sinus rhythm and no arrythmia  Lab Results:  Recent Labs  08/14/15 1826 08/15/15 1145  WBC 6.4 5.9  HGB 11.6* 11.4*  PLT 243 224    Recent Labs  08/14/15 1826 08/15/15 0448 08/15/15 1145  NA 141 140  --   K 3.2* 3.9  --   CL 105 107  --   CO2 25 25  --   GLUCOSE 173* 163*  --   BUN 17 12  --   CREATININE 0.79 0.68 0.65    Recent Labs  08/15/15 0448 08/15/15 1145  TROPONINI <0.03 <0.03    Recent Labs  08/14/15 1859  INR 0.93    Scheduled Meds: . amLODipine  10 mg Oral Daily  . aspirin EC  81 mg Oral Daily  . cloNIDine  0.2 mg Oral BID  . ezetimibe  10 mg Oral Daily  . gabapentin  300 mg Oral BID  . heparin  5,000 Units Subcutaneous 3 times per day  . insulin aspart  0-20 Units Subcutaneous TID WC  . isosorbide mononitrate  60 mg Oral Daily  . losartan  50 mg Oral Daily  . metoprolol succinate  100 mg Oral Daily  . ranolazine  500 mg Oral BID  . sodium chloride  3  mL Intravenous Q12H   Continuous Infusions: . 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 She left heart cath in November 2007 which revealed LAD luminal irregularities, ramus intermediate with superior branch 70% stenosis, circumflex 50% stenosis, marginal 50% stenosis, well preserved ejection fraction. She was treated medically. Stress test Jan 2013 : suggestion of lateral wall ischemia, likely artifact. She presented 08/14/15 with persistent Lt shoulder pain that responded to NTG. Her QTc on admission was 598 with K+ of 3.2. Troponin negative x 3. Cath done 08/15/15 showed moderate (70%) diffuse CAD with an EF of 50-55%. The plan is for medical therapy. Ranexa and Zetia have been added. Her prolonged QTc has resolved with correction of her K+, QTc 439 on 11/12.   Principal Problem:   Unstable angina (HCC) Active  Problems:   CAD (coronary artery disease)   Benign essential HTN   Prolonged Q-T interval on ECG   Type 2 diabetes with complication (HCC)   Hyperlipidemia   Obesity   Elevated TSH   Palpitations-no documented arrythmia   PLAN: Consider decreasing  Clonidine to 0.1 mg BID.              Synthroid suggested-? 0.5 mg daily             Should be OK for discharge today if she can ambulate in the hall.  Kerin Ransom PA-C 08/17/2015, 8:04 AM [phone]  Agree  Will start synthroid Ok to discharge post ambulation  Will not initiate ranolazine at this point as she is concerned re its impact on BP }she has been taking her clonidine 0.2 daily  Will have her split  and take 0.1 bid F/u JH/PA 3-4 weeks

## 2015-08-17 NOTE — Discharge Summary (Signed)
Patient ID: [name],  MRN: [id number], DOB/AGE: 12-20-1950 64 y.o.  Admit date: 08/14/2015 Discharge date: 08/17/2015  Primary Care Provider: Larey Dresser, MD Primary Cardiologist: Dr Percival Spanish  Discharge Diagnoses Principal Problem:   Unstable angina [facility]) Active Problems:   CAD (coronary artery disease)   Benign essential HTN   Prolonged Q-T interval on ECG   Type 2 diabetes with complication (HCC)   Hyperlipidemia   Obesity   Elevated TSH   Palpitations-no documented arrythmia    Procedures: Cardiac catheterization 08/15/15   Hospital Course:  64 year old morbidly obese AA female with a history of hypertension, GERD, hyperlipidemia, 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 She had a left heart cath in November 2007 which revealed LAD luminal irregularities, ramus intermediate with superior branch 70% stenosis, circumflex 50% stenosis, marginal 50% stenosis, and well preserved ejection fraction. She was treated medically. Stress test in Jan 2013 : showed a suggestion of lateral wall ischemia, likely artifact. She presented 08/14/15 with persistent Lt shoulder pain that responded to NTG. Her QTc on admission was 598 with K+ of 3.2. Her Troponin were negative x 3. Cath done 08/15/15 showed moderate (70%) diffuse CAD with an EF of 50-55%. The plan is for medical therapy.  Zetia was added and her Nitro dur patch changed to Imdur. The pt declined Ranexa.  Her prolonged QTc has resolved with correction of her K+, QTc 439 on 11/12. She was seen the morning of the 13 th by Dr Caryl Comes and felt to be stable for discharge. She'll f/u in the office in a few weeks. We did note that her TSH was abnormal-8.577. Low dose Synthroid was added. She should f/u with her PCP about her TSH.   Discharge Vitals:  Blood pressure 135/68, pulse 64, temperature 98.1 F (36.7 C),  temperature source Oral, resp. rate 16, height 5\' 1"  (1.549 m), weight 208 lb 11.2 oz (94.666 kg), last menstrual period 07/12/1984, SpO2 100 %.    Labs: Results for orders placed or performed during the hospital encounter of 08/14/15 (from the past 24 hour(s))  Glucose, capillary     Status: Abnormal   Collection Time: 08/16/15 11:38 AM  Result Value Ref Range   Glucose-Capillary 133 (H) 65 - 99 mg/dL  Glucose, capillary     Status: Abnormal   Collection Time: 08/16/15  4:47 PM  Result Value Ref Range   Glucose-Capillary 135 (H) 65 - 99 mg/dL  Glucose, capillary     Status: Abnormal   Collection Time: 08/16/15  9:07 PM  Result Value Ref Range   Glucose-Capillary 197 (H) 65 - 99 mg/dL  Glucose, capillary     Status: Abnormal   Collection Time: 08/17/15  7:34 AM  Result Value Ref Range   Glucose-Capillary 154 (H) 65 - 99 mg/dL    Disposition:      Follow-up Information    Follow up with Minus Breeding, MD.   Specialty:  Cardiology   Why:  office will call you   Contact information:   [place] [address] [phone]       Discharge Medications:    Medication List    STOP taking these medications        hydrocortisone 2.5 % lotion     mometasone 50 MCG/ACT nasal spray  Commonly known as:  NASONEX      TAKE these  medications        ACCU-CHEK FASTCLIX LANCETS Misc  USE ONE LANCET THREE TIMES DAILY BEFORE MEALS AND BEFORE BED. DX CODE: E11.40     amLODipine 10 MG tablet  Commonly known as:  NORVASC  TAKE ONE (1) TABLET BY MOUTH EVERY DAY     aspirin EC 81 MG tablet  Take 81 mg by mouth daily.     cloNIDine 0.2 MG tablet  Commonly known as:  CATAPRES  Take 0.5 tablets (0.1 mg total) by mouth 2 (two) times daily.     ezetimibe 10 MG tablet  Commonly known as:  ZETIA  Take 1 tablet (10 mg total) by mouth daily.     glucose blood test strip  Commonly known as:  ACCU-CHEK SMARTVIEW  Check blood sugar up to 3 times daily  Dx  code E11.40. Insulin dependent     ibuprofen 800 MG tablet  Commonly known as:  ADVIL,MOTRIN  Take 1 tablet (800 mg total) by mouth every 8 (eight) hours as needed. For pain     Insulin Isophane & Regular Human (70-30) 100 UNIT/ML PEN  Commonly known as:  HUMULIN 70/30 MIX  Inject 24 units before morning meal and 17 units before evening meal.     Insulin Syringe-Needle U-100 31G X 15/64" 0.5 ML Misc  1 applicator by Does not apply route 2 (two) times daily. Dx code E11.40     isosorbide mononitrate 60 MG 24 hr tablet  Commonly known as:  IMDUR  Take 1 tablet (60 mg total) by mouth daily.     levothyroxine 25 MCG tablet  Commonly known as:  SYNTHROID, LEVOTHROID  Take 1 tablet (25 mcg total) by mouth daily before breakfast.  Start taking on:  08/18/2015     loratadine 10 MG tablet  Commonly known as:  CLARITIN  TAKE ONE TABLET BY MOUTH ONCE DAILY FOR ALLERGIES     losartan 50 MG tablet  Commonly known as:  COZAAR  TAKE ONE (1) TABLET BY MOUTH EVERY DAY     metFORMIN 1000 MG tablet  Commonly known as:  GLUCOPHAGE  TAKE ONE (1) TABLET BY MOUTH TWO (2) TIMES DAILY WITH A MEAL  Start taking on:  08/18/2015     metoprolol succinate 100 MG 24 hr tablet  Commonly known as:  TOPROL-XL  Take 1 tablet (100 mg total) by mouth daily.     nitroGLYCERIN 0.4 MG SL tablet  Commonly known as:  NITROSTAT  Place 1 tablet (0.4 mg total) under the tongue every 5 (five) minutes as needed for chest pain.     PATADAY 0.2 % Soln  Generic drug:  Olopatadine HCl  Place 1 drop into both eyes daily as needed. For allergies     Turmeric (Curcuma Longa) Powd  1 scoop by Does not apply route daily.         Duration of Discharge Encounter: Greater than 30 minutes including physician time.  Angelena Form PA-C 08/17/2015 10:21 AM

## 2015-08-18 ENCOUNTER — Telehealth: Payer: Self-pay | Admitting: Cardiology

## 2015-08-18 MED FILL — Heparin Sodium (Porcine) 2 Unit/ML in Sodium Chloride 0.9%: INTRAMUSCULAR | Qty: 500 | Status: AC

## 2015-08-18 NOTE — Telephone Encounter (Signed)
D/C Phone call .Marland Kitchen Appt is on 09/05/15 at 10am w/ Dr. Percival Spanish , Post Cath .Marland Kitchen  Thanks

## 2015-08-18 NOTE — Telephone Encounter (Signed)
Patient contacted regarding discharge from [facility] on 08/17/15.   Patient understands to follow up with provider Dr. Percival Spanish on 12/2 at [place] at [facility] office. Patient understands discharge instructions? Yes Patient understands medications and regiment? Yes Patient understands to bring all medications to this visit? Yes  Pt requested sooner appt. No acute changes, no new problems. Informed her I would add to waitlist, she was amenable to this.

## 2015-08-20 ENCOUNTER — Telehealth: Payer: Self-pay | Admitting: Dietician

## 2015-08-20 NOTE — Telephone Encounter (Signed)
She is seeing cards in Dec. I will not have any openings until Jan. OK with HFU with cards and me in Jan

## 2015-08-20 NOTE — Telephone Encounter (Signed)
Patient informed. 

## 2015-08-20 NOTE — Telephone Encounter (Signed)
Called patient to reinitiate medical nutrition therapy visits. She scheduled for 09/04/15. She has recently been in the hospital.

## 2015-09-04 ENCOUNTER — Encounter: Payer: Self-pay | Admitting: Dietician

## 2015-09-04 ENCOUNTER — Telehealth: Payer: Self-pay | Admitting: Cardiology

## 2015-09-04 ENCOUNTER — Ambulatory Visit (INDEPENDENT_AMBULATORY_CARE_PROVIDER_SITE_OTHER): Payer: Medicare Other | Admitting: Dietician

## 2015-09-04 ENCOUNTER — Telehealth: Payer: Self-pay

## 2015-09-04 VITALS — Wt 212.8 lb

## 2015-09-04 DIAGNOSIS — E1140 Type 2 diabetes mellitus with diabetic neuropathy, unspecified: Secondary | ICD-10-CM | POA: Diagnosis not present

## 2015-09-04 DIAGNOSIS — Z794 Long term (current) use of insulin: Secondary | ICD-10-CM | POA: Diagnosis not present

## 2015-09-04 NOTE — Progress Notes (Signed)
Medical Nutrition Therapy:  Appt start time: 0930 end time:  1010. Last visit 06/2015 Assessment:  Primary concerns today: Weight management and Blood sugar control.  Patient was supposed to be in a group MNT session today, but other in groups did not attend. She reports she has stopped eating fried fish, and out at restaurants and overall she reports adherence to dietary goals. Wants better blood sugars.  Weight 212.8# today; weight stable. She is content with her current weight today. Weighs daily at home to self monitor.  Activity:  Bikes on stationary bike, walks and does leg exercises most days Meter download: averages: 30 day:145 (165 the previous month)  x 30 days, highest readings at bedtime .  Diabetes medicine: insulin 70/30 she reprots taking 18 units in AM and 24 before dinner in PM to try to improve her Pm blood sugars and metformin as directed  Patient Identified Concern: : Weight management and Blood sugar control.  Stage Patient Is In:  Contemplative to ready . Patient Reported Barriers: Pain from fall in ankles.  Patient Reported Perceived Benefits:  Feel better about herself, better health, less stress on joints Patient Reports Self-Efficacy:  Improving Behavior Change Supports: Family, CDE, doctor, likes group visits    Progress Towards Goal(s):  No progress.   Nutritional Diagnosis:  [place]-3.3 Overweight/obesity As related to long term imbalance between energy intake and output is stable As evidenced by her BMI of 40 will be deleted as patient not longer desires to work on this diagnosis.  [place] 2.2 altered nutrition related laboratory value A1C of >7% as related to higher blood sugar and recent hospitalization and stress as evidenced her increased A1C.      Intervention:  Nutrition Education: blood sugar control is improved. Support for continued self monitoring and physical activity provided.   Handouts given during visit include: AVS Demonstrated degree of understanding via:   Teach Back  Goals:   a1C < 7%  Monitoring/Evaluation:  Dietary intake, exercise, meter, and reevaluation of goals, body weight in 2 week(s).

## 2015-09-04 NOTE — Telephone Encounter (Signed)
I cannot answer that bc I did not dx her with hypothyroidism nor start the synthroid. Cardiology started inpt after TSH was 8.6. Her TSH was 3.8 in Oct and she had no signs /sxs of hypothyroidism. I did not see her during the hospital stay and therefore was unable to assess her for sxs of hypothyroidism and free T4 was not checked. Therefore, it is not clear to me whether she had hypothyroidism or subclinical hypothyroidism and just needed a repeat TSH in a few weeks. The D/C dx was elevated TSH and NOT hypothyroidism. She has not had a HFU since the D/C. I would recommend that she sch and appt in [facility] to discuss thyroid and need for medication (I would have just waited and repeated the TSH) or ask cardiology to manage the med they started until she comes for HFU. Thanks

## 2015-09-04 NOTE — Telephone Encounter (Signed)
Pharmacy called to inform of manufacturer change for pt's levothyroxine.  Change is from SANDOZ brand to Lannett.  Sent to Dr. Percival Spanish. Sees him for 2 week f/u post-cath tomorrow.

## 2015-09-04 NOTE — Telephone Encounter (Signed)
Thanks, the pharmacy will follow-up with Cards office.  Pt has appointment with them tomorrow.

## 2015-09-04 NOTE — Telephone Encounter (Signed)
Molly from [facility] calling to get OK to change name brand on pt levothyroxine from Sandoz to Lannett Is VO for this OK?

## 2015-09-05 ENCOUNTER — Encounter: Payer: Self-pay | Admitting: Cardiology

## 2015-09-05 ENCOUNTER — Ambulatory Visit (INDEPENDENT_AMBULATORY_CARE_PROVIDER_SITE_OTHER): Payer: Medicare Other | Admitting: Cardiology

## 2015-09-05 VITALS — BP 124/66 | HR 90 | Ht 61.0 in | Wt 211.2 lb

## 2015-09-05 DIAGNOSIS — I25709 Atherosclerosis of coronary artery bypass graft(s), unspecified, with unspecified angina pectoris: Secondary | ICD-10-CM | POA: Diagnosis not present

## 2015-09-05 NOTE — Progress Notes (Signed)
The patient presents for follow up of  CAD.   He was hospitalized in November with chest discomfort. She was found to have 70% proximal RCA stenosis, first marginal 80% stenosis, mid LAD 70% stenosis, distal LAD 70% stenosis, distal circumflex 70% stenosis in the circumflex 60% stenosis. The EF was 55%.  However, it was decided to manage her medically at that time.  I have reviewed these hospital records.    Since going home the patient has required 1 sublingual nitroglycerin. This happened when she got up quickly and he gets dizzy. She does get little shoulder pain when she rides her stationary bicycle but she thinks this is a stable pattern. She has had none of the severe episode prompted her hospitalization. She's not having any new shortness of breath, PND or orthopnea. He's not having any new palpitations, presyncope or syncope.    Allergies  Allergen Reactions  . Lisinopril Other (See Comments)    Chronic cough secondary to ACE  . Oxycodone-Acetaminophen Hives and Itching    flushing  . Atorvastatin Other (See Comments)    myalgias    Current Outpatient Prescriptions  Medication Sig Dispense Refill  . ACCU-CHEK FASTCLIX LANCETS MISC USE ONE LANCET THREE TIMES DAILY BEFORE MEALS AND BEFORE BED. DX CODE: E11.40 102 each 6  . amLODipine (NORVASC) 10 MG tablet TAKE ONE (1) TABLET BY MOUTH EVERY DAY 90 tablet 3  . aspirin EC 81 MG tablet Take 81 mg by mouth daily.    . cloNIDine (CATAPRES) 0.2 MG tablet Take 0.5 tablets (0.1 mg total) by mouth 2 (two) times daily. 180 tablet 3  . ezetimibe (ZETIA) 10 MG tablet Take 1 tablet (10 mg total) by mouth daily. 30 tablet 11  . glucose blood (ACCU-CHEK SMARTVIEW) test strip Check blood sugar up to 3 times daily  Dx code E11.40. Insulin dependent 100 each 11  . ibuprofen (ADVIL,MOTRIN) 800 MG tablet Take 1 tablet (800 mg total) by mouth every 8 (eight) hours as needed. For pain 90 tablet 0  . Insulin Isophane & Regular Human (HUMULIN 70/30 MIX)  (70-30) 100 UNIT/ML PEN Inject 24 units before morning meal and 17 units before evening meal. 15 mL 11  . Insulin Syringe-Needle U-100 31G X 15/64" 0.5 ML MISC 1 applicator by Does not apply route 2 (two) times daily. Dx code E11.40 100 each 12  . isosorbide mononitrate (IMDUR) 60 MG 24 hr tablet Take 1 tablet (60 mg total) by mouth daily. 30 tablet 11  . levothyroxine (SYNTHROID, LEVOTHROID) 25 MCG tablet Take 1 tablet (25 mcg total) by mouth daily before breakfast. 30 tablet 11  . loratadine (CLARITIN) 10 MG tablet TAKE ONE TABLET BY MOUTH ONCE DAILY FOR ALLERGIES 90 tablet 3  . losartan (COZAAR) 50 MG tablet TAKE ONE (1) TABLET BY MOUTH EVERY DAY 90 tablet 3  . metFORMIN (GLUCOPHAGE) 1000 MG tablet TAKE ONE (1) TABLET BY MOUTH TWO (2) TIMES DAILY WITH A MEAL 180 tablet 3  . metoprolol succinate (TOPROL-XL) 100 MG 24 hr tablet Take 1 tablet (100 mg total) by mouth daily. 90 tablet 3  . nitroGLYCERIN (NITRODUR - DOSED IN MG/24 HR) 0.4 mg/hr patch Place 0.4 mg onto the skin daily.    . nitroGLYCERIN (NITROSTAT) 0.4 MG SL tablet Place 1 tablet (0.4 mg total) under the tongue every 5 (five) minutes as needed for chest pain. 25 tablet 3  . Olopatadine HCl (PATADAY) 0.2 % SOLN Place 1 drop into both eyes daily as needed. For  allergies    . Turmeric, Curcuma Longa, POWD 1 scoop by Does not apply route daily.     No current facility-administered medications for this visit.    Past Medical History  Diagnosis Date  . CAD (coronary artery disease) 11/07    cath 11/11 :  3V CADsee report   . Essential hypertension     Requires 5 drug therapy and still difficult to control.  Marland Kitchen GERD (gastroesophageal reflux disease)   . Hyperlipidemia     On daily statin  . Obesity     Morbid. HAs worked with [name] T previously.  . Colonic polyp 1995    Colonoscopies 1994, 2000, and 02/02/2011. Last had 9 polyps - Tubular adenoma and tubulovillous was the pathology results without high grade dysplasia  . Chronic pain      MR 08/07/10 :  Spondylosis L4-5 with B foraminal narrowing and L4 nerve root enchroachment B.  . Seasonal allergies     seasonal  . Iron deficiency anemia     Ferritin was 12 in 2012. on FeSo4. Has had colon and EGD 02/02/11 that showed mild gastritis and colon polyps.  . Chronic venous insufficiency 2012    Has had full W/U incl ECHO, LFT's, Cr, Umicro, TSH, BNP. Symptomatic treatment.  . Diastolic CHF, chronic ([place])     NL EF by echo 01/2012, Gr I dd  . Arthritis     Knees  . 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 S/p lumpectomy and radiation.  . Diabetes mellitus type II     Insulin dependent. Has worked with [name] R previously.  . OSA (obstructive sleep apnea) 02/16/2007    02/2007 : Moderate AHI 35.6, O2 sat decreased to 65%, CPAP titration to 16 with AHI of 3.6. 02/2014 : Rare respiratory events with sleep disturbance, within normal limits. AHI 0.4 per hour        Past Surgical History  Procedure Laterality Date  . Colonoscopy w/ polypectomy  1994, 2000, 2012  . Total abdominal hysterectomy  1995    2/2 uterine cancer  . Breast lumpectomy w/ needle localization  2006    34 Chemo RX  . Cardiac catheterization N/A 08/15/2015    Procedure: Left Heart Cath and Coronary Angiography;  Surgeon: Troy Sine, MD;  Location: [facility];  Service: Cardiovascular;  Laterality: N/A;    ROS: Back pain and sciatic pain.  Otherwise as stated in the HPI and negative for all other systems.   PHYSICAL EXAM BP 124/66 mmHg  Pulse 90  Ht 5\' 1"  (1.549 m)  Wt 211 lb 3 oz (95.794 kg)  BMI 39.92 kg/m2  LMP 07/12/1984 GENERAL:  Well appearing HEENT:  Pupils equal round and reactive, fundi not visualized, oral mucosa unremarkable NECK:  No jugular venous distention, waveform within normal limits, carotid upstroke brisk and symmetric, no bruits, no thyromegaly LYMPHATICS:  No cervical, inguinal adenopathy LUNGS:  Clear to auscultation  bilaterally BACK:  No CVA tenderness HEART:  PMI not displaced or sustained,S1 and S2 within normal limits, no S3, no S4, no clicks, no rubs, no murmurs ABD:  Flat, positive bowel sounds normal in frequency in pitch, no bruits, no rebound, no guarding, no midline pulsatile mass, no hepatomegaly, no splenomegaly, obese EXT:  2 plus pulses throughout, mild leg edema, no cyanosis no clubbing, right wrist cath site OK.   EKG:   ASSESSMENT AND PLAN   CAD:  I will  continue to manager her medically.  She needs aggressive risk reduction.   HTN:  The blood pressure is at target. No change in medications is indicated. We will continue with therapeutic lifestyle changes (TLC).  HYPERLIPIDEMIA:   She has been intolerant of statins. She was just placed on Zetia and she wants to try this first although I don't suspect her LDL will reach target. Her LDL was 140s calculated. She might be a candidate however for a PCSK9 inhibitor. I will defer follow-up of her lipid level to her primary provider.  DIABETES:  Her hemoglobin A1c was 8.6 in Nov.  She is following with Dr. Lynnae January.   OBESITY:   The patient understands the need to lose weight with diet and exercise. We have discussed specific strategies for this.  PREOP:  The patient be at acceptable risk for the planned procedure to have a skin lesion removed.

## 2015-09-05 NOTE — Patient Instructions (Addendum)
Your physician recommends that you schedule a follow-up appointment in: 4 Months  If you need a refill on your cardiac medications before your next appointment, please call your pharmacy.  Merry Christmas and Happy New Year!!

## 2015-09-24 ENCOUNTER — Telehealth: Payer: Self-pay | Admitting: Dietician

## 2015-09-24 NOTE — Telephone Encounter (Signed)
Confirmed her appointment for tomorrow.

## 2015-09-25 ENCOUNTER — Other Ambulatory Visit: Payer: Self-pay | Admitting: Internal Medicine

## 2015-09-25 ENCOUNTER — Ambulatory Visit (INDEPENDENT_AMBULATORY_CARE_PROVIDER_SITE_OTHER): Payer: Medicare Other | Admitting: Dietician

## 2015-09-25 VITALS — Wt 212.2 lb

## 2015-09-25 DIAGNOSIS — Z794 Long term (current) use of insulin: Secondary | ICD-10-CM | POA: Diagnosis not present

## 2015-09-25 DIAGNOSIS — E119 Type 2 diabetes mellitus without complications: Secondary | ICD-10-CM | POA: Diagnosis not present

## 2015-09-25 DIAGNOSIS — Z713 Dietary counseling and surveillance: Secondary | ICD-10-CM

## 2015-09-25 DIAGNOSIS — E118 Type 2 diabetes mellitus with unspecified complications: Secondary | ICD-10-CM

## 2015-09-25 LAB — GLUCOSE, CAPILLARY: GLUCOSE-CAPILLARY: 125 mg/dL — AB (ref 65–99)

## 2015-09-25 NOTE — Patient Instructions (Signed)
Keep up the great work!    Your weight is stable, your blood sugar was 125 after earing today!! That is great, too!  You can call our pharmacist anytime- her name is Dr. Maudie Mercury

## 2015-09-25 NOTE — Progress Notes (Signed)
Medical Nutrition Therapy:  Appt start time: 0930 end time:  1000. Last visit 06/2015 Assessment:  Primary concerns today: Weight management and Blood sugar control.  Patient was supposed to be in a group MNT session today, but other in group members  did not attend. She did not bring her meter, but says her blood sugar have all been in the 100s  Except for two when she ate sweets. Her family eats sweets in front of her making it difficult to resist.  overall she reports adherence to dietary goals. Wants better blood sugars.  Weight 212.2# today; weight stable and she is content with her current weight. 208# yesterday am fasting at home.  Activity:  Bikes on stationary bike, walks and does leg exercises most days Meter download: meter not brought in, she reports 150 today fasting, it was 125 ~ 2-3 hours after eating breakfast of 1/4 dirty rice from [employer], she says most blood sugar at home <300, two lows in 70s with symptoms when she went a long time without eating Diabetes medicine: insulin 70/30 she reports taking 18 units in AM and 24 before dinner in PM to try to improve her Pm blood sugars and metformin as directed , Zetia , she reports thyroid medicine is making her have a difficult time swallowing Patient Identified Concern:  Weight management and Blood sugar control.  Stage Patient Is In:  Contemplative to ready . Patient Reported Perceived Benefits:  Feel better about herself, better health, less stress on joints Patient Reports Self-Efficacy:  Improving Behavior Change Supports: Family, CDE, doctor, likes group visits    Progress Towards Goal(s):  Some progress.   Nutritional Diagnosis:   [place] 2.2 altered nutrition related laboratory values of A1C of >7% and LDL of >100 as related to higher blood sugar and not being able to tolerate a statin  and stress as evidenced her increased A1C and LDL of 142.      Intervention:  Nutrition Education: blood sugar control, benecol for lipids,  alternative diabetes/insulin regimens to encourage weight loss and decrease risk of hypoglycemia.  Support for continued self monitoring and physical activity provided.   Handouts given during visit include: AVS, [place], have ordered coupons for her Demonstrated degree of understanding via:  Teach Back  Goals:   a1C < 7%  Monitoring/Evaluation:  Dietary intake, exercise, meter, and reevaluation of goals, body weight in 4 week(s).

## 2015-10-17 ENCOUNTER — Other Ambulatory Visit: Payer: Self-pay | Admitting: General Surgery

## 2015-10-17 DIAGNOSIS — L723 Sebaceous cyst: Secondary | ICD-10-CM | POA: Diagnosis not present

## 2015-10-17 DIAGNOSIS — L720 Epidermal cyst: Secondary | ICD-10-CM | POA: Diagnosis not present

## 2015-10-17 DIAGNOSIS — D171 Benign lipomatous neoplasm of skin and subcutaneous tissue of trunk: Secondary | ICD-10-CM | POA: Diagnosis not present

## 2015-10-22 ENCOUNTER — Other Ambulatory Visit: Payer: Self-pay

## 2015-10-22 MED ORDER — GLUCOSE BLOOD VI STRP: ORAL_STRIP | Status: DC

## 2015-10-22 NOTE — Telephone Encounter (Signed)
rec'd fax stating insurance requiring new rx q6 months

## 2015-10-24 ENCOUNTER — Telehealth: Payer: Self-pay | Admitting: Internal Medicine

## 2015-10-24 ENCOUNTER — Other Ambulatory Visit: Payer: Self-pay | Admitting: Internal Medicine

## 2015-10-24 NOTE — Telephone Encounter (Signed)
[facility] SAID MEDICAID WILL NOT COVER INSULIN THE QUICK PEN 70/30 ANYMORE, WHAT CAN SHE DO TO GET THIS MEDICATION.

## 2015-10-29 ENCOUNTER — Encounter: Payer: Self-pay | Admitting: Internal Medicine

## 2015-10-30 ENCOUNTER — Ambulatory Visit (INDEPENDENT_AMBULATORY_CARE_PROVIDER_SITE_OTHER): Payer: Medicare Other | Admitting: Dietician

## 2015-10-30 ENCOUNTER — Encounter: Payer: Self-pay | Admitting: Internal Medicine

## 2015-10-30 ENCOUNTER — Ambulatory Visit (INDEPENDENT_AMBULATORY_CARE_PROVIDER_SITE_OTHER): Payer: Medicare Other | Admitting: Internal Medicine

## 2015-10-30 ENCOUNTER — Encounter: Payer: Self-pay | Admitting: Dietician

## 2015-10-30 VITALS — BP 119/50 | HR 57 | Temp 98.2°F | Wt 213.2 lb

## 2015-10-30 DIAGNOSIS — Z794 Long term (current) use of insulin: Secondary | ICD-10-CM | POA: Diagnosis not present

## 2015-10-30 DIAGNOSIS — Z713 Dietary counseling and surveillance: Secondary | ICD-10-CM | POA: Diagnosis not present

## 2015-10-30 DIAGNOSIS — R7989 Other specified abnormal findings of blood chemistry: Secondary | ICD-10-CM

## 2015-10-30 DIAGNOSIS — E119 Type 2 diabetes mellitus without complications: Secondary | ICD-10-CM

## 2015-10-30 DIAGNOSIS — I10 Essential (primary) hypertension: Secondary | ICD-10-CM | POA: Diagnosis not present

## 2015-10-30 DIAGNOSIS — I2510 Atherosclerotic heart disease of native coronary artery without angina pectoris: Secondary | ICD-10-CM | POA: Diagnosis not present

## 2015-10-30 DIAGNOSIS — E785 Hyperlipidemia, unspecified: Secondary | ICD-10-CM | POA: Diagnosis not present

## 2015-10-30 DIAGNOSIS — I872 Venous insufficiency (chronic) (peripheral): Secondary | ICD-10-CM

## 2015-10-30 DIAGNOSIS — Z0000 Encounter for general adult medical examination without abnormal findings: Secondary | ICD-10-CM

## 2015-10-30 DIAGNOSIS — E1140 Type 2 diabetes mellitus with diabetic neuropathy, unspecified: Secondary | ICD-10-CM

## 2015-10-30 DIAGNOSIS — Z7982 Long term (current) use of aspirin: Secondary | ICD-10-CM

## 2015-10-30 DIAGNOSIS — Z6841 Body Mass Index (BMI) 40.0 and over, adult: Secondary | ICD-10-CM

## 2015-10-30 DIAGNOSIS — R946 Abnormal results of thyroid function studies: Secondary | ICD-10-CM

## 2015-10-30 DIAGNOSIS — I2584 Coronary atherosclerosis due to calcified coronary lesion: Secondary | ICD-10-CM

## 2015-10-30 NOTE — Progress Notes (Signed)
Medical Nutrition Therapy:  Appt start time: 1000 end time:  1030. Last visit 09/25/2015 Assessment:  Primary concerns today: Weight management and Blood sugar control.  Patient attended group MNT session today.. Wants better blood sugars.  Weight 213.2# today; weight stable and she is content with her current weight.  Activity:  Bikes on stationary bike, walks and does leg exercises most days Meter download: Her meter showed average of 154.  Diabetes medicine: Not discussed today Patient Identified Concern:  Weight management and Blood sugar control.  Stage Patient Is In:  Contemplative to ready . Patient Reported Perceived Benefits:  Feel better about herself, better health, less stress on joints Patient Reports Self-Efficacy:  Improving Behavior Change Supports: Family, CDE, doctor, likes group visits    Progress Towards Goal(s):  Some progress.   Nutritional Diagnosis:   [place] 2.2 altered nutrition related laboratory values of A1C of >7% and LDL of >100 as related to higher blood sugar and not being able to tolerate a statin  and stress as evidenced her increased A1C and LDL of 142.      Intervention:  Nutrition Education: blood sugar control, weight management. Provided support for food monitoring and physical activity provided.  Coordination of care- consider medicine selection based on affect on weight .  Handouts given during visit include: AVS Demonstrated degree of understanding via:  Teach Back  Goals:   a1C < 7%  Monitoring/Evaluation:  Dietary intake, exercise, meter, and reevaluation of goals, body weight in 3 week(s).

## 2015-10-30 NOTE — Assessment & Plan Note (Signed)
She states her weight varies based on her fluid and her baseline is 208. She "balloons" up with fluid in legs and ankles. Today is moderate day per pt. Takes lasix 20 BID PRN based on fluid. Taking first dose about 7:30 AM and last dose at 5 PM and has sig nocturia but states that occurs even if doesn't take second dose of lasix. Also takes 8-9 glasses of water a day.  PLAN : Move up second dose of lasix to at least 6 hrs prior to bed Decrease water intake in afternoon and evening.

## 2015-10-30 NOTE — Patient Instructions (Signed)
Thank you for coming to the group Nutrition group meeting today.   This is to confirm that you signed up for the next group meeting on:   November 20, 2015 at 10:30 AM  Please feel free to bring food labels, questions and concerns  

## 2015-10-30 NOTE — Addendum Note (Signed)
Addended by: Larey Dresser A on: 10/30/2015 03:28 PM   Modules accepted: Orders

## 2015-10-30 NOTE — Assessment & Plan Note (Signed)
She had an elevated TSH while hospitalized for unstable angina in Nov 2016. Sxs were not recorded but was started on synthroid. TSH in Oct 2016 had been nl. She has had a few mildly elevated TSH in past (all < 10) but always when repeated, inc T4, would be nl.   As for sxs - no weight gain, no DOE, no constipation, no depression, myalgias, no weakness. She does endorse dry skin, edema. She did state she had fatigue prior to the Nov hospitalization (effort to walk across the room) but seemed to indicate was CP related that resolved after D/C.   I am not convinced she has hypothyroidism or subclinical hypothyroidism and therefore had refused to fill her synthroid bc 1. Her TSH was checked while ill and hospitalized 2. TSH just month prior while stable nl 3. Mildly elevate TSH in past always nl on repeat 4. No signs of hypothyroidism and her sxs are vague and likely multifactorial.  Also the literature on the contribution to CAD and the benefit to CAD with treatment is controversial.  When taking the synthroid she got "closing up of her throat and face tightening" and stopped it in Jan. Angioedema is a reported SE but freq not listed.    PLAN Stay off synthroid TSH in March Will cont to follow TSH at least yrly

## 2015-10-30 NOTE — Assessment & Plan Note (Signed)
Will need lab appt March early for TSH, lipid panel.

## 2015-10-30 NOTE — Assessment & Plan Note (Signed)
Bc intolerant of statin, zetia added Nov 2016. She wants to wait until March to get the FLP.

## 2015-10-30 NOTE — Assessment & Plan Note (Signed)
BP Readings from Last 3 Encounters:  10/30/15 119/50  09/05/15 124/66  08/17/15 135/68   Good today on norvasc 10, clonidine 0.2 BID, lasix 20 BID PRN, losartan 50, imdur 60 QD.  PLAN :  Cont current meds

## 2015-10-30 NOTE — Progress Notes (Signed)
   Subjective:    Patient ID: [name], female    DOB: 1951/02/24, 65 y.o.   MRN: [id number]  HPI  [name] is here for Dm F/U. Please see the A&P for the status of the pt's chronic medical problems.  ROS : per ROS section and in problem oriented charting. All other systems are negative. PMHx, Soc hx, and / or Fam hx : hospital stay in Nov for CP and cath.  Still eating out with daughters. Can't afford LE stockings.  Review of Systems  Constitutional: Negative for fatigue and unexpected weight change.  Cardiovascular: Positive for leg swelling. Negative for chest pain.  Gastrointestinal: Negative for constipation.       + bloating  Genitourinary: Positive for frequency.       +nocturia  Skin:       Endorses dry skin  Psychiatric/Behavioral:       No memory issues       Objective:   Physical Exam  Constitutional: She appears well-developed and well-nourished. No distress.  HENT:  Head: Normocephalic and atraumatic.  Right Ear: External ear normal.  Left Ear: External ear normal.  Nose: Nose normal.  Eyes: Conjunctivae and EOM are normal.  Cardiovascular: Normal rate, regular rhythm and normal heart sounds.   Pulmonary/Chest: Effort normal and breath sounds normal. She has no rales.  Musculoskeletal: Normal range of motion. She exhibits edema. She exhibits no tenderness.  Neurological: She is alert.  Skin: Skin is warm and dry. She is not diaphoretic.  Psychiatric: She has a normal mood and affect. Her behavior is normal. Judgment and thought content normal.          Assessment & Plan:

## 2015-10-30 NOTE — Assessment & Plan Note (Signed)
Insurance will no longer pay for Humulin 70/30. Will discuss substitution with [name] - maybe Novolin 70/30. Taking 18 units in Am and 25 in PM.  Too early for A1 C - last had increased from 7.4 to 8.0 bc eating out with daughters but now states is still doing that but eating less.  Last microalb nl  PLAN : Find insulin insurance will cover To meet with [name] today A1C in March, early

## 2015-10-30 NOTE — Assessment & Plan Note (Signed)
Weight was 224 in 2015, now 212. So overall down. Cont to work with [name]

## 2015-10-30 NOTE — Assessment & Plan Note (Signed)
No CP. Taking all meds. Mgmt Dr Stanford Breed. Can't tolerate the NTG patch bc gives her skin bumps.  PLAN : Cont ASA, BB, zetia, isosorbide. NTG SL PRN

## 2015-10-31 ENCOUNTER — Other Ambulatory Visit: Payer: Self-pay | Admitting: Dietician

## 2015-10-31 MED ORDER — INSULIN ASPART PROT & ASPART (70-30 MIX) 100 UNIT/ML PEN: PEN_INJECTOR | SUBCUTANEOUS | Status: DC

## 2015-10-31 NOTE — Telephone Encounter (Signed)
CDE Randon Goldsmith to find out what insulin that is comparable to Humulin 70/30 is covered by Ms. Svehla's Medicare Part D plan that comes in a pen. ? Novolog Mix 70/30 flexpen?  Novolin 70/30 is covered for ~3$, but does not come in a pen. [facility] says that the Novolog Mix 70/30 Flexpen is covered. This is comparable to Humulin 70/30 except she should take it 15 minutes before meals. I can call and educate her about that.

## 2015-10-31 NOTE — Addendum Note (Signed)
Addended by: Larey Dresser A on: 10/31/2015 10:42 AM   Modules accepted: Orders

## 2015-10-31 NOTE — Telephone Encounter (Addendum)
CDE called patient and educated her on taking her new insulin 15 minutes before her morning and evening meals. She verbalized understanding using teachback says that she had increased her PM dose to 28 units when her blood sugars were higher, but will go back to 18 units before breakfast and 25 units before her evening meals and call if she has any hypoglycemia or hyperglycemia.

## 2015-11-07 ENCOUNTER — Other Ambulatory Visit: Payer: Self-pay | Admitting: Internal Medicine

## 2015-11-11 ENCOUNTER — Other Ambulatory Visit: Payer: Self-pay | Admitting: Internal Medicine

## 2015-11-20 ENCOUNTER — Ambulatory Visit (INDEPENDENT_AMBULATORY_CARE_PROVIDER_SITE_OTHER): Payer: Medicare Other | Admitting: Dietician

## 2015-11-20 VITALS — Wt 215.2 lb

## 2015-11-20 DIAGNOSIS — E1165 Type 2 diabetes mellitus with hyperglycemia: Secondary | ICD-10-CM | POA: Diagnosis not present

## 2015-11-20 DIAGNOSIS — E1140 Type 2 diabetes mellitus with diabetic neuropathy, unspecified: Secondary | ICD-10-CM

## 2015-11-20 DIAGNOSIS — Z713 Dietary counseling and surveillance: Secondary | ICD-10-CM | POA: Diagnosis not present

## 2015-11-20 DIAGNOSIS — Z6841 Body Mass Index (BMI) 40.0 and over, adult: Secondary | ICD-10-CM

## 2015-11-20 DIAGNOSIS — Z794 Long term (current) use of insulin: Principal | ICD-10-CM

## 2015-11-20 NOTE — Patient Instructions (Signed)
Good to see you today!!  Your presence was great support to the others.   Your blood sugars were a bit higher recently. We can talk about that at your next visit or feel free to call me before that if you want help troubleshooting why it might be higher.  See you March 16th. , .  Salmaan Patchin [phone]

## 2015-11-20 NOTE — Progress Notes (Signed)
Medical Nutrition Therapy:  Appt start time: 1030 end time:  1130. Last visit 10/30/2015  Assessment: Primary concerns today: healthy meal plan, glycemic control and weight loss  Patient attended Group Medical Nutrition Therapy visit today. Education included importance of calorie control in weight loss, improtacne of decreasing sodium intake, importance of support and sharing of recipes and feelings about weight loss and diabetes.weight today- 273.3#, little change, BMI- 45.5 She verbalized inadequate sleep and stopping breathing in her sleep, but does not use her CPAP. She also verbalized her frustration with weight loss and wanting to "give up".   SELF MONITORING-weighs 3 times a week at home, does not know her A1C  MEDICINE:  not discssed today DIETARY INTAKE: says she is tracking but thinking about starting to turn it in for feedback Weight- 215.2# increased 2 # since last visit. says she is retaining fluid and does not know why. Is taking her lasix ACTIVITY: is interested in group activity, but has barriers of transportation and lack of material resources   Progress Towards Goal(s):  No progress.   Nutritional Diagnosis:   [place] 2.2 altered nutrition related laboratory values of A1C of >7% and LDL of >100 as related to higher blood sugar and not being able to tolerate a statin  and stress as evidenced her increased A1C and LDL of 142.    Intervention: Group Nutrition counseling and education about maintaining activity, keeping food records to limit calories, importance of good sleep, shared recipes and feeling about diabetes. Support of group.   Handouts given during visit include: habits of successful weight loss, weight food and activity trackers Barriers to learning/adherence to lifestyle change: competing values, lack of support  Demonstrated degree of understanding via: Teach Back   Monitoring/Evaluation: Dietary intake, exercise, meter and body weight in 4 week(s)  per patient request

## 2015-12-18 ENCOUNTER — Ambulatory Visit (INDEPENDENT_AMBULATORY_CARE_PROVIDER_SITE_OTHER): Payer: Medicare Other | Admitting: Dietician

## 2015-12-18 VITALS — Wt 214.4 lb

## 2015-12-18 DIAGNOSIS — E119 Type 2 diabetes mellitus without complications: Secondary | ICD-10-CM

## 2015-12-18 DIAGNOSIS — Z794 Long term (current) use of insulin: Secondary | ICD-10-CM

## 2015-12-18 DIAGNOSIS — Z713 Dietary counseling and surveillance: Secondary | ICD-10-CM

## 2015-12-18 DIAGNOSIS — E1140 Type 2 diabetes mellitus with diabetic neuropathy, unspecified: Secondary | ICD-10-CM

## 2015-12-19 ENCOUNTER — Encounter: Payer: Self-pay | Admitting: Dietician

## 2015-12-19 NOTE — Progress Notes (Signed)
Medical Nutrition Therapy:  Appt start time: 1030 end time:  1130. Last visit 11/20/2015  Assessment: Primary concerns today: healthy meal plan, glycemic control and weight loss  Patient attended Group Medical Nutrition Therapy visit today. Education included how to keep a food record, how to read a label, estimating portion sizes using your hand. .   SELF MONITORING-weighs 3 times a week at home, average blood sugar 166 with 70% in range MEDICINE:  not discssed today DIETARY INTAKE: says she is tracking but thinking about starting to turn it in for feedback Weight- 214.4#  ACTIVITY: is interested in group activity, but has barriers of transportation and lack of material resources   Progress Towards Goal(s):  No progress.   Nutritional Diagnosis:   [place] 2.2 altered nutrition related laboratory values of A1C of >7% as related to higher blood sugar  and stress is stable as evidenced her increased A1C .    Intervention: Group Nutrition counseling and education about maintaining activity, keeping food records to limit calories, importance of good sleep, shared recipes and feeling about diabetes. Support of group.   Handouts given during visit include: weight food and activity trackers Barriers to learning/adherence to lifestyle change: competing values, lack of support  Demonstrated degree of understanding via: Teach Back   Monitoring/Evaluation: Dietary intake, exercise, meter and body weight in 4 week(s) per patient request

## 2015-12-19 NOTE — Patient Instructions (Signed)
See you April 20th at 10:30 AM for group MNT

## 2016-01-07 ENCOUNTER — Other Ambulatory Visit: Payer: Self-pay | Admitting: Internal Medicine

## 2016-01-07 NOTE — Telephone Encounter (Signed)
Needs appt [name] Dm F/U. May or June

## 2016-01-22 ENCOUNTER — Ambulatory Visit: Payer: Medicare Other | Admitting: Dietician

## 2016-02-06 ENCOUNTER — Other Ambulatory Visit: Payer: Self-pay | Admitting: *Deleted

## 2016-02-19 ENCOUNTER — Ambulatory Visit: Payer: Medicare Other | Admitting: Dietician

## 2016-02-26 ENCOUNTER — Encounter: Payer: Self-pay | Admitting: Internal Medicine

## 2016-02-26 ENCOUNTER — Ambulatory Visit (INDEPENDENT_AMBULATORY_CARE_PROVIDER_SITE_OTHER): Payer: Medicare Other | Admitting: Internal Medicine

## 2016-02-26 VITALS — BP 134/62 | HR 73 | Temp 97.7°F | Ht 61.0 in | Wt 213.6 lb

## 2016-02-26 DIAGNOSIS — I10 Essential (primary) hypertension: Secondary | ICD-10-CM | POA: Diagnosis not present

## 2016-02-26 DIAGNOSIS — Z794 Long term (current) use of insulin: Secondary | ICD-10-CM | POA: Diagnosis not present

## 2016-02-26 DIAGNOSIS — R946 Abnormal results of thyroid function studies: Secondary | ICD-10-CM | POA: Diagnosis not present

## 2016-02-26 DIAGNOSIS — L910 Hypertrophic scar: Secondary | ICD-10-CM

## 2016-02-26 DIAGNOSIS — E785 Hyperlipidemia, unspecified: Secondary | ICD-10-CM | POA: Diagnosis not present

## 2016-02-26 DIAGNOSIS — Z7982 Long term (current) use of aspirin: Secondary | ICD-10-CM | POA: Diagnosis not present

## 2016-02-26 DIAGNOSIS — R7989 Other specified abnormal findings of blood chemistry: Secondary | ICD-10-CM

## 2016-02-26 DIAGNOSIS — E1140 Type 2 diabetes mellitus with diabetic neuropathy, unspecified: Secondary | ICD-10-CM | POA: Diagnosis not present

## 2016-02-26 LAB — POCT GLYCOSYLATED HEMOGLOBIN (HGB A1C): HEMOGLOBIN A1C: 7.9

## 2016-02-26 LAB — GLUCOSE, CAPILLARY: Glucose-Capillary: 96 mg/dL (ref 65–99)

## 2016-02-26 MED ORDER — TRIAMCINOLONE ACETONIDE 0.1 % EX OINT: 1.0000 "application " | TOPICAL_OINTMENT | Freq: Two times a day (BID) | CUTANEOUS | Status: DC

## 2016-02-26 MED ORDER — INSULIN ASPART PROT & ASPART (70-30 MIX) 100 UNIT/ML PEN: PEN_INJECTOR | SUBCUTANEOUS | Status: DC

## 2016-02-26 NOTE — Patient Instructions (Signed)
1. You have a follow up appointment as follows:  Dr. Lynnae January 03/18/16 @ 9:45 AM  2. Please take all medications as previously prescribed with the following changes:  Increase evening dose of insulin to 27 units  Start using Kenalog 0.1% cream twice daily on scar.   3. If you have worsening of your symptoms or new symptoms arise, please call the clinic [phone]), or go to the ER immediately if symptoms are severe.  You have done a great job in taking all your medications. Please continue to do this.

## 2016-02-26 NOTE — Progress Notes (Signed)
Subjective:   Patient ID: [name] female   DOB: 1951-06-15 65 y.o.   MRN: [id number]  HPI: [name] is a 64 y.o. female w/ PMHx of CAD, HTN, HLD, GERD, chronic venous insufficiency, iron deficiency anemia, chronic dCHF, DM type II, previous h/o Breast CA s/p treatment, and OSA, presents to the clinic today for an acute visit for pain on her right side. The area that is causing her discomfort is on her right side where she previously had a small surgical incision made which has now resulted in a hypertrophic scar. She says this area gives her a dull pain and some other sharp sensations that shoot from the lesion. She has tried various creams as well as a hydrocortisone, but nothing has helped her discomfort.   She also reports some mildly elevated blood sugars recently, however, her repeat HbA1c is improved to 7.9 from 8.6 in 08/2015. She continues to take Metformin 1000 mg bid but states that she thinks this is causing myalgias. She also takes Novolog 70/30, 18 units in the AM, 25 units in the PM. She says she had one low blood sugar during the day somewhat recently where she states she had a CBG of 90 and was symptomatic. No other significant lows.   Past Medical History  Diagnosis Date  . CAD (coronary artery disease) 11/07    cath 11/11 :  3V CADsee report   . Essential hypertension     Requires 5 drug therapy and still difficult to control.  Marland Kitchen GERD (gastroesophageal reflux disease)   . Hyperlipidemia     On daily statin  . Obesity     Morbid. HAs worked with [name] T previously.  . Colonic polyp 1995    Colonoscopies 1994, 2000, and 02/02/2011. Last had 9 polyps - Tubular adenoma and tubulovillous was the pathology results without high grade dysplasia  . Chronic pain     MR 08/07/10 :  Spondylosis L4-5 with B foraminal narrowing and L4 nerve root enchroachment B.  . Seasonal allergies     seasonal  . Iron deficiency anemia     Ferritin was 12 in 2012. on FeSo4. Has  had colon and EGD 02/02/11 that showed mild gastritis and colon polyps.  . Chronic venous insufficiency 2012    Has had full W/U incl ECHO, LFT's, Cr, Umicro, TSH, BNP. Symptomatic treatment.  . Diastolic CHF, chronic ([place])     NL EF by echo 01/2012, Gr I dd  . Arthritis     Knees  . 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 S/p lumpectomy and radiation.  . Diabetes mellitus type II     Insulin dependent. Has worked with [name] R previously.  . OSA (obstructive sleep apnea) 02/16/2007    02/2007 : Moderate AHI 35.6, O2 sat decreased to 65%, CPAP titration to 16 with AHI of 3.6. 02/2014 : Rare respiratory events with sleep disturbance, within normal limits. AHI 0.4 per hour       Current Outpatient Prescriptions  Medication Sig Dispense Refill  . ACCU-CHEK FASTCLIX LANCETS MISC USE ONE LANCET THREE TIMES DAILY BEFORE MEALS AND BEFORE BED. DX CODE: E11.40 102 each 6  . amLODipine (NORVASC) 10 MG tablet TAKE ONE (1) TABLET BY MOUTH EVERY DAY 90 tablet 3  . aspirin EC 81 MG tablet Take 81 mg by mouth daily.    . cloNIDine (CATAPRES) 0.2 MG tablet Take 1 tablet (0.2  mg total) by mouth 2 (two) times daily. 180 tablet 3  . ezetimibe (ZETIA) 10 MG tablet Take 1 tablet (10 mg total) by mouth daily. 30 tablet 11  . furosemide (LASIX) 20 MG tablet Take 20 mg by mouth 2 (two) times daily as needed.    Marland Kitchen glucose blood (ACCU-CHEK SMARTVIEW) test strip Check blood sugar up to 3 times daily  Dx code E11.40. Insulin dependent 100 each 11  . ibuprofen (ADVIL,MOTRIN) 800 MG tablet TAKE ONE TABLET BY MOUTH EVERY EIGHT HOURS AS NEEDED FOR PAIN 30 tablet 1  . insulin aspart protamine - aspart (NOVOLOG 70/30 MIX) (70-30) 100 UNIT/ML FlexPen Take 18 units in AM and 25 units in PM. Take 15 min prior to meal. 45 mL 3  . Insulin Isophane & Regular Human (HUMULIN 70/30 MIX) (70-30) 100 UNIT/ML PEN Inject 24 units before morning meal and 17 units before evening meal. 15 mL 11    . isosorbide mononitrate (IMDUR) 60 MG 24 hr tablet Take 1 tablet (60 mg total) by mouth daily. 30 tablet 11  . loratadine (CLARITIN) 10 MG tablet TAKE ONE TABLET BY MOUTH ONCE DAILY FOR ALLERGIES 90 tablet 3  . losartan (COZAAR) 50 MG tablet TAKE ONE (1) TABLET BY MOUTH EVERY DAY 90 tablet 3  . metFORMIN (GLUCOPHAGE) 1000 MG tablet TAKE ONE (1) TABLET BY MOUTH TWO (2) TIMES DAILY WITH A MEAL 180 tablet 3  . metoprolol succinate (TOPROL-XL) 100 MG 24 hr tablet TAKE ONE (1) TABLET BY MOUTH EVERY DAY 90 tablet 3  . nitroGLYCERIN (NITRODUR - DOSED IN MG/24 HR) 0.4 mg/hr patch APPLY ONE (1) PATCH ON SKIN EVERY DAY. LEAVE ON FOR 12 HOURS AND OFF FOR 12 HOURS (Patient not taking: Reported on 10/30/2015) 90 patch 3  . nitroGLYCERIN (NITROSTAT) 0.4 MG SL tablet Place 1 tablet (0.4 mg total) under the tongue every 5 (five) minutes as needed for chest pain. 25 tablet 3  . Olopatadine HCl (PATADAY) 0.2 % SOLN Place 1 drop into both eyes daily as needed. For allergies    . Turmeric, Curcuma [place]a, POWD 1 scoop by Does not apply route daily.    Marland Kitchen UNIFINE PENTIPS 31G X 5 MM MISC USE FOR INJECTIONS TWICE DAILY 100 each 3   No current facility-administered medications for this visit.    Review of Systems: General: Denies fever, chills, diaphoresis, appetite change and fatigue.  Respiratory: Denies SOB, DOE, cough, and wheezing.   Cardiovascular: Denies chest pain and palpitations.  Gastrointestinal: Denies nausea, vomiting, abdominal pain, and diarrhea.  Genitourinary: Denies dysuria, increased frequency, and flank pain. Endocrine: Denies hot or cold intolerance, polyuria, and polydipsia. Musculoskeletal: Positive for right side pain. Denies myalgias, back pain, joint swelling, arthralgias and gait problem.  Skin: Denies pallor, rash and wounds.  Neurological: Denies dizziness, seizures, syncope, weakness, lightheadedness, numbness and headaches.  Psychiatric/Behavioral: Denies mood changes, and sleep  disturbances.  Objective:   Physical Exam: Filed Vitals:   02/26/16 1039  BP: 134/62  Pulse: 73  Temp: 97.7 F (36.5 C)  TempSrc: Oral  Height: 5\' 1"  (1.549 m)  Weight: 213 lb 9.6 oz (96.888 kg)  SpO2: 100%    General: Obese AA female, alert, cooperative, NAD. HEENT: PERRL, EOMI. Moist mucus membranes Neck: Full range of motion without pain, supple, no lymphadenopathy or carotid bruits Lungs: Clear to ascultation bilaterally, normal work of respiration, no wheezes, rales, rhonchi Heart: RRR, no murmurs, gallops, or rubs Abdomen: Soft, non-tender, non-distended, BS +. Right flank/axilla with hypertrophic scar (  3.5 cm x 1 cm), non-tender, no surrounding erythema, induration, drainage, or fluctuance.  Extremities: No cyanosis, clubbing, or edema Neurologic: Alert & oriented X3, cranial nerves II-XII intact, strength grossly intact, sensation intact to light touch   Assessment & Plan:   Please see problem based assessment and plan.

## 2016-02-27 ENCOUNTER — Other Ambulatory Visit: Payer: Self-pay | Admitting: Internal Medicine

## 2016-02-27 LAB — LIPID PANEL
CHOLESTEROL TOTAL: 211 mg/dL — AB (ref 100–199)
Chol/HDL Ratio: 3.2 ratio units (ref 0.0–4.4)
HDL: 66 mg/dL (ref >39)
LDL Calculated: 125 mg/dL — ABNORMAL HIGH (ref 0–99)
TRIGLYCERIDES: 99 mg/dL (ref 0–149)
VLDL Cholesterol Cal: 20 mg/dL (ref 5–40)

## 2016-02-27 LAB — TSH: TSH: 3.01 u[IU]/mL (ref 0.450–4.500)

## 2016-02-27 MED ORDER — FUROSEMIDE 20 MG PO TABS: 20.0000 mg | ORAL_TABLET | Freq: Two times a day (BID) | ORAL | Status: DC | PRN

## 2016-02-27 NOTE — Assessment & Plan Note (Signed)
Repeat TSH normal

## 2016-02-27 NOTE — Assessment & Plan Note (Signed)
Checked lipid panel today -Continue Zetia

## 2016-02-27 NOTE — Assessment & Plan Note (Signed)
Patient says this area (right flank/axilla) has been causing her discomfort for some time. Says it causes her a dull pain with some additional sharp shooting pains as well at times. On exam, scar is prominent, non-tender, not indurated or erythematous, no fluctuance or drainage. She says she has tried some hydrocortisone cream but it hasn't seemed to help. Other recommendations suggest intralesional 5-FU, silicon gel pads, tight fitting clothes. -Will try higher potency triamcinolone 0.1% bid for now -Discussed other potential options in the future

## 2016-02-27 NOTE — Assessment & Plan Note (Signed)
BP Readings from Last 3 Encounters:  02/26/16 134/62  10/30/15 119/50  09/05/15 124/66    Lab Results  Component Value Date   NA 140 08/15/2015   K 3.9 08/15/2015   CREATININE 0.65 08/15/2015    Assessment: Blood pressure control:  Well controlled Progress toward BP goal:   At goal Comments: Patient taking Toprol-XL 100 mg daily, Losartan 50 mg daily, Imdur 60 mg daily, Clonidine 0.2 mg bid, and Norvasc 10 mg daily  Plan: Medications:  continue current medications Other plans: RTC in 1-2 weeks to follow up with PCP.

## 2016-02-27 NOTE — Assessment & Plan Note (Signed)
Lab Results  Component Value Date   HGBA1C 7.9 02/26/2016   HGBA1C 8.6* 08/15/2015   HGBA1C 8.0 07/10/2015     Assessment: Diabetes control:  Improved Progress toward A1C goal:   Not at goal Comments: Taking Metformin 1000 mg bid + Novolog 70/30, 18 units in the AM, 25 units in the PM. Patient says she is sure that her Metformin has been causing her myalgias but can't quite explain why she feels that this is causing this. Not having muscle pains currently. Has been taking this for some time without this being an issue previously. 1-5% of patients on Metformin can experience myalgias, therefore this is quite possible, but unlikely. Discussed the significant benefits of Metformin, potentially outweigh the risks of possibly causing muscle pain. She will discuss this further with her PCP.   Plan: Medications:  continue current medications; increase evening 70/30 dose to 27 units. Home glucose monitoring: Frequency:   Timing: before meals Instruction/counseling given: reminded to bring blood glucose meter & log to each visit, reminded to bring medications to each visit, discussed foot care, discussed the need for weight loss and discussed diet Educational resources provided: brochure Self management tools provided: copy of home glucose meter download Other plans: RTC in 1-2 weeks to follow up with PCP

## 2016-02-27 NOTE — Progress Notes (Signed)
Internal Medicine Clinic Attending  Case discussed with Dr. Jones at the time of the visit.  We reviewed the resident's history and exam and pertinent patient test results.  I agree with the assessment, diagnosis, and plan of care documented in the resident's note.  

## 2016-03-18 ENCOUNTER — Ambulatory Visit (INDEPENDENT_AMBULATORY_CARE_PROVIDER_SITE_OTHER): Payer: Medicare Other | Admitting: Dietician

## 2016-03-18 ENCOUNTER — Encounter: Payer: Self-pay | Admitting: Dietician

## 2016-03-18 ENCOUNTER — Ambulatory Visit (INDEPENDENT_AMBULATORY_CARE_PROVIDER_SITE_OTHER): Payer: Medicare Other | Admitting: Internal Medicine

## 2016-03-18 ENCOUNTER — Encounter: Payer: Self-pay | Admitting: Internal Medicine

## 2016-03-18 VITALS — BP 135/55 | HR 71 | Temp 97.9°F | Wt 215.2 lb

## 2016-03-18 DIAGNOSIS — E1140 Type 2 diabetes mellitus with diabetic neuropathy, unspecified: Secondary | ICD-10-CM

## 2016-03-18 DIAGNOSIS — I10 Essential (primary) hypertension: Secondary | ICD-10-CM | POA: Diagnosis not present

## 2016-03-18 DIAGNOSIS — Z794 Long term (current) use of insulin: Secondary | ICD-10-CM

## 2016-03-18 DIAGNOSIS — Z713 Dietary counseling and surveillance: Secondary | ICD-10-CM

## 2016-03-18 DIAGNOSIS — L910 Hypertrophic scar: Secondary | ICD-10-CM

## 2016-03-18 DIAGNOSIS — E119 Type 2 diabetes mellitus without complications: Secondary | ICD-10-CM

## 2016-03-18 MED ORDER — INSULIN ASPART PROT & ASPART (70-30 MIX) 100 UNIT/ML PEN: PEN_INJECTOR | SUBCUTANEOUS | Status: DC

## 2016-03-18 MED ORDER — OCCLUSIVE SILICONE SHEETS SHEE: MEDICATED_PATCH | Status: DC

## 2016-03-18 NOTE — Assessment & Plan Note (Signed)
On metformin 1000 BID. 70/30 29 in AM and 25 in PM. The doses reported to me never match much recent instructions. CBG's 130 after lunch and just before dinner. Consistently high thereafter and into AM.   PLAN : increase PM dose from 25 to 27. If still high, she will then increase further to 29. Leave AM dose at 29.

## 2016-03-18 NOTE — Assessment & Plan Note (Signed)
On norvac 10, toprol 100, losartan 50, imdur 60, clonidine 0.2, lasix 20 PRN. BP her good. States drops at home to 90/50's and she feels bad. This is not related to lasix use. Occurs both in day and at night.   PLAN : will taper the clonidine to OFF. Instructions given. Clonidine has a lot of SE and with reported hypotension, is the one to stop. If BP high after stopping, can increase ARB to max.

## 2016-03-18 NOTE — Assessment & Plan Note (Signed)
She had a cyst / abscess on R alteral inf rib chest wall which I tried to I&D but too painful and had to stop. She saw gen surgery and she states they excised a cyst. Now it is itchy, feels like something is poking her, is tender, and painful.  Did not improve with topical steroid cream. Will try silicone sheet. If no better, send to derm for intralesional tx.

## 2016-03-18 NOTE — Progress Notes (Signed)
Medical Nutrition Therapy:  Appt start time: 1030 end time:  1130. Last visit 12/18/2015  Assessment: Primary concerns today: healthy meal plan, glycemic control and weight loss  Patient attended Group Medical Nutrition Therapy visit today. Education included how to stay focused during trying times done by our Education officer, museum. . Group shared successes, challenges, goals and solutions  SELF MONITORING-weighs at home, her new goal is to not stress over her weight MEDICINE: she had a doctor appointment and this was addressed at that visit DIETARY INTAKE: says she is tracking but thinking about starting to turn it in for feedback Weight- 215.2# - little change overall- she is retaining fluid today ACTIVITY: is interested in group activity, but has barriers of transportation and lack of material resources   Progress Towards Goal(s):  No progress.   Nutritional Diagnosis:   [place] 2.2 altered nutrition related laboratory values of A1C of >7% as related to higher blood sugar and stress is improved as evidenced her A1C decreased to 7.9% .    Intervention: Group Nutrition counseling and education about staying focused during trying times Barriers to learning/adherence to lifestyle change: competing values, lack of support  Demonstrated degree of understanding via: Teach Back   Monitoring/Evaluation: Dietary intake, exercise, meter and body weight in 4 week(s) per patient request

## 2016-03-18 NOTE — Patient Instructions (Signed)
Thank you for coming to the group meeting today.  This note is to confirm that you have been signed up to attend next month's group meeting on July 20th at 10:30 AM.  As always, please do not hesitate to call for any questions or concerns!  Have a good July 4th!  [name]  [phone]

## 2016-03-18 NOTE — Progress Notes (Signed)
   Subjective:    Patient ID: [name], female    DOB: 27-Mar-1951, 65 y.o.   MRN: [id number]  HPI  [name] is here for painful scar. Please see the A&P for the status of the pt's chronic medical problems.  ROS : per ROS section and in problem oriented charting. All other systems are negative.   PMHx, Soc hx, and / or Fam hx : Brother died 01-20-23 2/2 heart dz and kidney failure  Review of Systems  Constitutional:       Meat tastes off. Doesn't feel well when BP lolw  Skin: Positive for wound.       Itching and discolouration  Psychiatric/Behavioral: Positive for sleep disturbance.       Stressed over brother's death       Objective:   Physical Exam  Constitutional: She appears well-developed and well-nourished.  HENT:  Head: Normocephalic and atraumatic.  Right Ear: External ear normal.  Left Ear: External ear normal.  Nose: Nose normal.  Eyes: Conjunctivae and EOM are normal.  Cardiovascular: Normal rate, regular rhythm and normal heart sounds.   Pulmonary/Chest: Effort normal and breath sounds normal.  Musculoskeletal: Normal range of motion. She exhibits edema.  +1 B  Neurological: She is alert.  Skin: Skin is warm and dry. She is not diaphoretic.  Hypertrophic scar R inf rib cage laterally  Psychiatric: She has a normal mood and affect. Her behavior is normal. Judgment and thought content normal.          Assessment & Plan:

## 2016-03-18 NOTE — Patient Instructions (Signed)
LOW BLOOD PRESSURE Take one half clonidine pill twice a day for 3 days then stop  HIGH SUGAR Increase evening insulin dose to 27 units. If sugar at night and in morning still high, increase to 29 units.  FOR SCAR Try the silicone sheets

## 2016-03-29 ENCOUNTER — Other Ambulatory Visit: Payer: Self-pay | Admitting: *Deleted

## 2016-03-29 MED ORDER — ACCU-CHEK FASTCLIX LANCETS MISC: Status: DC

## 2016-03-29 NOTE — Telephone Encounter (Signed)
RX expired. Thanks!

## 2016-03-30 DIAGNOSIS — Z7984 Long term (current) use of oral hypoglycemic drugs: Secondary | ICD-10-CM | POA: Diagnosis not present

## 2016-03-30 DIAGNOSIS — H40023 Open angle with borderline findings, high risk, bilateral: Secondary | ICD-10-CM | POA: Diagnosis not present

## 2016-03-30 DIAGNOSIS — H40053 Ocular hypertension, bilateral: Secondary | ICD-10-CM | POA: Diagnosis not present

## 2016-03-30 DIAGNOSIS — H353121 Nonexudative age-related macular degeneration, left eye, early dry stage: Secondary | ICD-10-CM | POA: Diagnosis not present

## 2016-03-30 DIAGNOSIS — Z794 Long term (current) use of insulin: Secondary | ICD-10-CM | POA: Diagnosis not present

## 2016-03-30 DIAGNOSIS — E109 Type 1 diabetes mellitus without complications: Secondary | ICD-10-CM | POA: Diagnosis not present

## 2016-03-30 DIAGNOSIS — H35033 Hypertensive retinopathy, bilateral: Secondary | ICD-10-CM | POA: Diagnosis not present

## 2016-03-30 DIAGNOSIS — I10 Essential (primary) hypertension: Secondary | ICD-10-CM | POA: Diagnosis not present

## 2016-03-30 LAB — HM DIABETES EYE EXAM

## 2016-03-31 ENCOUNTER — Telehealth: Payer: Self-pay | Admitting: Dietician

## 2016-03-31 NOTE — Telephone Encounter (Signed)
Called to say thank you for book that we mailed. She saw her eye doctor recently.

## 2016-04-01 ENCOUNTER — Encounter: Payer: Self-pay | Admitting: *Deleted

## 2016-04-22 ENCOUNTER — Encounter: Payer: Self-pay | Admitting: Dietician

## 2016-04-22 ENCOUNTER — Ambulatory Visit (INDEPENDENT_AMBULATORY_CARE_PROVIDER_SITE_OTHER): Payer: Medicare Other | Admitting: Dietician

## 2016-04-22 DIAGNOSIS — Z713 Dietary counseling and surveillance: Secondary | ICD-10-CM

## 2016-04-22 DIAGNOSIS — Z794 Long term (current) use of insulin: Secondary | ICD-10-CM | POA: Diagnosis not present

## 2016-04-22 DIAGNOSIS — E1140 Type 2 diabetes mellitus with diabetic neuropathy, unspecified: Secondary | ICD-10-CM | POA: Diagnosis not present

## 2016-04-22 NOTE — Progress Notes (Signed)
Medical Nutrition Therapy:  Appt start time: 1030 end time:  1130. Last visit 03/18/2016  Assessment: Primary concerns today: healthy meal plan, glycemic control and weight loss  Patient attended Group Medical Nutrition Therapy visit today. Education included successes, challenges, participation in a healthy cooking demonstration.   goal is to not stress over her weight- she rated herself as doing well with this, weighs one time a week now  SELF MONITORING-per meter download-107-224 for past week- only brought one of her two meter. Her highest values are between 11Am to 1 PM DIETARY INTAKE: says she is tracking but thinking about starting to turn it in for feedback Weight- 214.# -reported by patient ACTIVITY: is interested in group activity, but has barriers of transportation and lack of material resources   Progress Towards Goal(s):  No progress.   Nutritional Diagnosis:    2.2 altered nutrition related laboratory values of A1C of >7% as related to higher blood sugar and stress is improved as evidenced her A1C decreased to 7.9%  And blood sugars 76.4 in target in past 3 months.    Intervention: Group Nutrition counseling and education about healthy options for easy and healthy coking/food options. Barriers to learning/adherence to lifestyle change: competing values, lack of support  Demonstrated degree of understanding via: Teach Back   Monitoring/Evaluation: Dietary intake, exercise, meter and body weight in 8 week(s)

## 2016-05-03 ENCOUNTER — Other Ambulatory Visit: Payer: Self-pay

## 2016-05-03 MED ORDER — GLUCOSE BLOOD VI STRP: ORAL_STRIP | 11 refills | Status: DC

## 2016-05-03 MED ORDER — ACCU-CHEK FASTCLIX LANCETS MISC: 6 refills | Status: DC

## 2016-05-03 NOTE — Telephone Encounter (Signed)
Requesting test strips and lancets to be filled @ [facility] on [address].

## 2016-05-03 NOTE — Telephone Encounter (Signed)
Needs Sept appt with me CC HTN F/U

## 2016-05-21 ENCOUNTER — Other Ambulatory Visit: Payer: Self-pay

## 2016-05-21 MED ORDER — ACCU-CHEK FASTCLIX LANCETS MISC: 6 refills | Status: DC

## 2016-05-21 MED ORDER — GLUCOSE BLOOD VI STRP: ORAL_STRIP | 11 refills | Status: DC

## 2016-05-21 NOTE — Telephone Encounter (Signed)
Pt is at the pharmacy right now, stating test strips and lancets is not at the drug store. Please call pt back.

## 2016-05-21 NOTE — Telephone Encounter (Signed)
Verified with pharmacy the RX was received. Notified patient.

## 2016-05-21 NOTE — Telephone Encounter (Signed)
Patient states that pharmacy never received anything from our office. Spoke with pharmacy who stated that they did not get any refills of testing supplies. Our system shows that the prescriptions where e-scribed 05/03/2016 and confirmed by pharmacy/ Will not accept phone in. Will attempt to resend.

## 2016-06-03 DIAGNOSIS — H35033 Hypertensive retinopathy, bilateral: Secondary | ICD-10-CM | POA: Diagnosis not present

## 2016-06-03 DIAGNOSIS — H11153 Pinguecula, bilateral: Secondary | ICD-10-CM | POA: Diagnosis not present

## 2016-06-03 DIAGNOSIS — H04123 Dry eye syndrome of bilateral lacrimal glands: Secondary | ICD-10-CM | POA: Diagnosis not present

## 2016-06-03 DIAGNOSIS — H40013 Open angle with borderline findings, low risk, bilateral: Secondary | ICD-10-CM | POA: Diagnosis not present

## 2016-06-03 DIAGNOSIS — H11423 Conjunctival edema, bilateral: Secondary | ICD-10-CM | POA: Diagnosis not present

## 2016-06-03 DIAGNOSIS — H40053 Ocular hypertension, bilateral: Secondary | ICD-10-CM | POA: Diagnosis not present

## 2016-06-03 DIAGNOSIS — H2513 Age-related nuclear cataract, bilateral: Secondary | ICD-10-CM | POA: Diagnosis not present

## 2016-06-03 DIAGNOSIS — H353121 Nonexudative age-related macular degeneration, left eye, early dry stage: Secondary | ICD-10-CM | POA: Diagnosis not present

## 2016-06-03 DIAGNOSIS — H18413 Arcus senilis, bilateral: Secondary | ICD-10-CM | POA: Diagnosis not present

## 2016-06-03 DIAGNOSIS — E119 Type 2 diabetes mellitus without complications: Secondary | ICD-10-CM | POA: Diagnosis not present

## 2016-06-03 DIAGNOSIS — H25013 Cortical age-related cataract, bilateral: Secondary | ICD-10-CM | POA: Diagnosis not present

## 2016-06-03 DIAGNOSIS — H35372 Puckering of macula, left eye: Secondary | ICD-10-CM | POA: Diagnosis not present

## 2016-06-10 ENCOUNTER — Other Ambulatory Visit: Payer: Self-pay | Admitting: Internal Medicine

## 2016-06-10 DIAGNOSIS — Z1231 Encounter for screening mammogram for malignant neoplasm of breast: Secondary | ICD-10-CM

## 2016-06-11 ENCOUNTER — Ambulatory Visit
Admission: RE | Admit: 2016-06-11 | Discharge: 2016-06-11 | Disposition: A | Discharge status: Home / Self Care | Payer: Medicare Other | Source: Ambulatory Visit | Attending: Internal Medicine | Admitting: Internal Medicine

## 2016-06-11 DIAGNOSIS — Z1231 Encounter for screening mammogram for malignant neoplasm of breast: Secondary | ICD-10-CM | POA: Diagnosis not present

## 2016-06-24 ENCOUNTER — Ambulatory Visit (INDEPENDENT_AMBULATORY_CARE_PROVIDER_SITE_OTHER): Payer: Medicare Other | Admitting: Internal Medicine

## 2016-06-24 ENCOUNTER — Ambulatory Visit: Payer: Medicare Other | Admitting: Dietician

## 2016-06-24 ENCOUNTER — Ambulatory Visit (HOSPITAL_COMMUNITY)
Admission: RE | Admit: 2016-06-24 | Discharge: 2016-06-24 | Disposition: A | Discharge status: Home / Self Care | Payer: Medicare Other | Source: Ambulatory Visit | Attending: Internal Medicine | Admitting: Internal Medicine

## 2016-06-24 VITALS — BP 132/66 | HR 69 | Temp 98.3°F | Ht 61.0 in | Wt 215.3 lb

## 2016-06-24 DIAGNOSIS — M25571 Pain in right ankle and joints of right foot: Secondary | ICD-10-CM | POA: Diagnosis not present

## 2016-06-24 DIAGNOSIS — Z79899 Other long term (current) drug therapy: Secondary | ICD-10-CM

## 2016-06-24 DIAGNOSIS — M7989 Other specified soft tissue disorders: Secondary | ICD-10-CM | POA: Diagnosis not present

## 2016-06-24 DIAGNOSIS — Z794 Long term (current) use of insulin: Secondary | ICD-10-CM

## 2016-06-24 DIAGNOSIS — I10 Essential (primary) hypertension: Secondary | ICD-10-CM | POA: Diagnosis not present

## 2016-06-24 DIAGNOSIS — G4733 Obstructive sleep apnea (adult) (pediatric): Secondary | ICD-10-CM | POA: Diagnosis not present

## 2016-06-24 DIAGNOSIS — E1165 Type 2 diabetes mellitus with hyperglycemia: Secondary | ICD-10-CM

## 2016-06-24 DIAGNOSIS — E1140 Type 2 diabetes mellitus with diabetic neuropathy, unspecified: Secondary | ICD-10-CM | POA: Diagnosis not present

## 2016-06-24 DIAGNOSIS — Z0000 Encounter for general adult medical examination without abnormal findings: Secondary | ICD-10-CM

## 2016-06-24 DIAGNOSIS — S99911A Unspecified injury of right ankle, initial encounter: Secondary | ICD-10-CM | POA: Diagnosis not present

## 2016-06-24 DIAGNOSIS — Z23 Encounter for immunization: Secondary | ICD-10-CM

## 2016-06-24 LAB — POCT GLYCOSYLATED HEMOGLOBIN (HGB A1C): Hemoglobin A1C: 8

## 2016-06-24 LAB — GLUCOSE, CAPILLARY: Glucose-Capillary: 94 mg/dL (ref 65–99)

## 2016-06-24 NOTE — Progress Notes (Signed)
   Subjective:    Patient ID: [name], female    DOB: 1951-05-14, 65 y.o.   MRN: [id number]  HPI  [name] is here for diabetes follow-up. Please see the A&P for the status of the pt's chronic medical problems.  ROS : per ROS section and in problem oriented charting. All other systems are negative.  PMHx, Soc hx, and / or Fam hx : her adult daughter has schizophrenia and bipolar. This daughter has 1 son who is 1 years old and is being raised by an aunt. The daughter was asked to leave her apartment due to her behavior. The daughter is now living with my patient. ACT is working with the daughter to get her her own apartment. The daughter is still delusional but my patient states that she is safe in her home.  Review of Systems  Constitutional: Positive for fatigue. Negative for unexpected weight change.       + Diaphoresis  Cardiovascular: Positive for leg swelling.  Gastrointestinal: Positive for nausea.  Musculoskeletal: Positive for arthralgias, gait problem and joint swelling.  Skin: Positive for wound. Negative for color change.  Neurological: Positive for dizziness, weakness, light-headedness and headaches.  Psychiatric/Behavioral: Positive for sleep disturbance. The patient is not nervous/anxious.        Objective:   Physical Exam  Constitutional: She is oriented to person, place, and time. She appears well-developed and well-nourished. No distress.  HENT:  Head: Atraumatic.  Right Ear: External ear normal.  Left Ear: External ear normal.  Nose: Nose normal.  Eyes: Conjunctivae and EOM are normal. Right eye exhibits no discharge. Left eye exhibits no discharge. No scleral icterus.  Cardiovascular: Normal rate, regular rhythm and normal heart sounds.   Musculoskeletal: Normal range of motion. She exhibits edema and tenderness. She exhibits no deformity.  L ankle : medial swelling and point tenderness over medial malleolus. + pain with internal rotation.  Otherwise, FROM  Neurological: She is alert and oriented to person, place, and time.  Skin: Skin is warm and dry. No rash noted. She is not diaphoretic. No erythema. No pallor.  Psychiatric: She has a normal mood and affect. Her behavior is normal. Judgment and thought content normal.          Assessment & Plan:

## 2016-06-24 NOTE — Progress Notes (Signed)
Patient was not seen by diabetes educator today and did not attend group nutrition session.

## 2016-06-24 NOTE — Assessment & Plan Note (Signed)
She refuses the flu vaccine. She is willing to get the pneumococcal 23 vaccine today. I verified that this was. She got the first 79 in June 2012. She got the 53 in January 2016.  Today she complains of right ankle pain. About 2 weeks ago she was exiting a [employer] moving [place] [employer] and got a dizzy spell and ended up on the ground. She hurt her right ankle in the process. It was swollen and she was unable to put weight on it. She had to drag along the ground as she walked. It has gotten better over the 2 weeks and she is able to ambulate with a cane. However she still has pain on the medial malleolus and still some swelling. Due to the point tenderness I decided to get a plain film which was negative for fracture but did show soft tissue swelling.  Plan: Her pain and edema will resolve with time. She has a nonsteroidal back and the use as needed.

## 2016-06-24 NOTE — Assessment & Plan Note (Signed)
BP Readings from Last 3 Encounters:  06/24/16 132/66  03/18/16 (!) 135/55  02/26/16 134/62   Her regimen including Norvasc 10, Toprol 100, losartan 50, Lasix 20 when necessary, and imdur 60. Blood pressure is acceptable today but would prefer to be at the lower. However she continues to state that her blood pressure drops to the 90/50 at home but is not as bad as when she was on clonidine. She feels better off clonidine.  PLAN:  Cont current meds

## 2016-06-24 NOTE — Patient Instructions (Signed)
1. Work on your diet to help lower your sugar 2. I will let you know your xray now 3. I will look into another sleep study 4. Let me know if you want a referral to dermatology for the scar

## 2016-06-24 NOTE — Assessment & Plan Note (Signed)
Her A1c today is 8.0 up from 7.9. She is on 70/30 20 units in the morning and 25 units before dinner. Her units that she takes never match up with what she is prescribed. She brought her meter today and her fasting are in the 150s. Her post lunch goes up to 206. Her dinners are low 170s to 180s. Before bed she is in the 230s. She states that when she woke up today she was in the 170s and ate a good breakfast and by the time she got to the clinic she was in the 90s. She states that this rapid drop from the 170s to 90s causes her hypoglycemic symptoms which she did experience today in the clinic. She has been working with [name] to improve her diet and lose weight although her success has been limited. I would like to increase her insulin to get her sugar under better control however this is going to increase her sugar drop after insulin administration and will worsen her hypoglycemic symptoms.  Years ago she had been on basal bolus that was switched to 70/30 due to financial issues. I would prefer to get her off 70/30 and on to basal bolus. I spoke to [name] who thinks that she has maximized her dietary changes and recommended a basal along with a GLP-1 or SGLT2. I do not know just how much weight gain she would get from a newer class of meds but I might be willing to try. Will need to look into cost and insurance coverage.  PLAN:  Cont current meds F/U 3 months

## 2016-06-24 NOTE — Assessment & Plan Note (Signed)
Her 2008 sleep study was diagnostic of moderate sleep apnea. Her 2015 sleep study showed no sleep apnea. Today she complains of snoring, not sleeping well, waking up un rested, and headache. She is trying over-the-counter medications like Benadryl without significant relief. She is willing to undergo another sleep study if her insurance company will cover that cost.  PLAN: Order a split night sleep study

## 2016-06-28 ENCOUNTER — Telehealth: Payer: Self-pay | Admitting: Internal Medicine

## 2016-06-28 NOTE — Telephone Encounter (Signed)
Pt with cold chills, low grade fever, diarrhea . Please call

## 2016-06-28 NOTE — Telephone Encounter (Signed)
Pt calls and states she had pneumococcal vaccine 9/21 then on 9/23 she awoke w/ temp of 100.2, diarrhea, cough, congestion, aches and general malaise, this continues today, she is keeping liquids down and resting- she wonders if this is a reaction to the vaccination or has she gotten a flu type "bug"?

## 2016-06-28 NOTE — Telephone Encounter (Signed)
Yes, those can be side effects of the vaccine but could have also gotten flu type bug - no way to really know. I support cont liquids and rest. Call if gets worse.

## 2016-06-29 ENCOUNTER — Encounter: Payer: Self-pay | Admitting: Internal Medicine

## 2016-06-29 NOTE — Telephone Encounter (Signed)
Spoke with pt, she is taking fluids and resting, will call her tomorrow and check how she is feeling

## 2016-06-30 NOTE — Telephone Encounter (Signed)
Spoke w/ pt today, she is better except for a "nasty hard cough", she is encouraged to use otc meds for cough and will check with her Friday am on status, she states the diarrhea is better but still low grade fevers 99.5

## 2016-07-01 NOTE — Telephone Encounter (Signed)
Phone has been disconnected 

## 2016-07-02 ENCOUNTER — Telehealth: Payer: Self-pay | Admitting: *Deleted

## 2016-07-02 NOTE — Telephone Encounter (Signed)
Have tried to call both ph#'s, 1 has been disconnected or is out of service, lm on the other for a rtc, last visit note does not discuss cough, will need new appt

## 2016-07-02 NOTE — Telephone Encounter (Signed)
Patient want something called in for her cough. What she has is not working.

## 2016-07-08 NOTE — Telephone Encounter (Signed)
Called once again, someone answered stating they were not pt but was the call about some cough medicine, she was informed it was and pt need an appt, she stated ok, i'll have her call you

## 2016-07-16 ENCOUNTER — Telehealth: Payer: Self-pay | Admitting: Dietician

## 2016-07-16 NOTE — Telephone Encounter (Signed)
Patient called to request appointment for group diabetes meeting this month for her daughter and herself. Appointment mailed to patient.

## 2016-07-22 ENCOUNTER — Ambulatory Visit (INDEPENDENT_AMBULATORY_CARE_PROVIDER_SITE_OTHER): Payer: Medicare Other | Admitting: Dietician

## 2016-07-22 ENCOUNTER — Encounter: Payer: Self-pay | Admitting: Dietician

## 2016-07-22 DIAGNOSIS — Z794 Long term (current) use of insulin: Secondary | ICD-10-CM

## 2016-07-22 DIAGNOSIS — Z713 Dietary counseling and surveillance: Secondary | ICD-10-CM | POA: Diagnosis not present

## 2016-07-22 DIAGNOSIS — E1140 Type 2 diabetes mellitus with diabetic neuropathy, unspecified: Secondary | ICD-10-CM

## 2016-07-22 DIAGNOSIS — E1165 Type 2 diabetes mellitus with hyperglycemia: Secondary | ICD-10-CM

## 2016-07-22 NOTE — Progress Notes (Signed)
Medical Nutrition Therapy:  Appt start time: [id number] end time:  1115. Last visit 04/2016  Assessment: Primary concerns today: healthy meal plan, glycemic control and weight loss  Patient attended Group Medical Nutrition Therapy visit today. Education included successes, challenges, participation in a healthy cooking demonstration.   goal is to Follow up of previous goal to not stress over her weight- she rated herself as doing well with this,   SELF MONITORING-per meter download-107-224 for past month. Her highest values are between 11Am to 1 PM and 8-10 PM, average 174 Labs: most recent A1C is 8% DIETARY INTAKE: Not assessed Weight- 219.# increased 5# since July ACTIVITY: is interested in group activity, but has barriers of transportation and lack of material resources   Progress Towards Goal(s):  No progress.   Nutritional Diagnosis:   [place] 2.2 altered nutrition related laboratory values of A1C of >7% as related to higher blood sugar remains about the same as evidenced her A1C of 8%  Which is above goal..    Intervention: Group Nutrition counseling and education about healthy options for easy and healthy coking/food options. Barriers to learning/adherence to lifestyle change: competing values, lack of support  Demonstrated degree of understanding via: Teach Back   Monitoring/Evaluation: Dietary intake, exercise, meter and body weight in 2 weeks by phone and 4 weeks for group week(s)

## 2016-07-22 NOTE — Patient Instructions (Signed)
Keep up the hard work of eating healhty and moving more.  Reminder of your Goals:  1. walk 15 minutes twice a day  2. eat only healthy snacks  3. Eat my meals slowly and eat only one serving (of staches? ) (do non starchy veggies like green beans count?)   Happy Halloween!   See you on November 16th!

## 2016-08-06 ENCOUNTER — Ambulatory Visit (INDEPENDENT_AMBULATORY_CARE_PROVIDER_SITE_OTHER): Payer: Medicare Other | Admitting: Internal Medicine

## 2016-08-06 DIAGNOSIS — M7989 Other specified soft tissue disorders: Secondary | ICD-10-CM

## 2016-08-06 DIAGNOSIS — Z7982 Long term (current) use of aspirin: Secondary | ICD-10-CM

## 2016-08-06 DIAGNOSIS — Z794 Long term (current) use of insulin: Secondary | ICD-10-CM | POA: Diagnosis not present

## 2016-08-06 DIAGNOSIS — M7731 Calcaneal spur, right foot: Secondary | ICD-10-CM | POA: Diagnosis not present

## 2016-08-06 DIAGNOSIS — E1140 Type 2 diabetes mellitus with diabetic neuropathy, unspecified: Secondary | ICD-10-CM

## 2016-08-06 DIAGNOSIS — M25571 Pain in right ankle and joints of right foot: Secondary | ICD-10-CM

## 2016-08-06 NOTE — Progress Notes (Signed)
Internal Medicine Clinic Attending  I saw and evaluated the patient.  I personally confirmed the key portions of the history and exam documented by Dr. Blum and I reviewed pertinent patient test results.  The assessment, diagnosis, and plan were formulated together and I agree with the documentation in the resident's note. 

## 2016-08-06 NOTE — Patient Instructions (Signed)
It was pleasure to meet you today [name]  For your right ankle-try wrapping this, continue taking the ibuprofen and keeping it elevated as much as possible.  If this pain becomes worse or you develop any new symptoms please call us. Please schedule an appointment to see [place] again in 3 months.

## 2016-08-06 NOTE — Progress Notes (Signed)
   CC: diabetic foot exam, right ankle pain   HPI: [name] is a 65 y.o. with past medical history as outlined below who presents to clinic for follow up of ankle pain and for diabetic foot exam. In August of this year she fell on her right ankle. He had an appointment in the clinic soon after x-rays were negative for acute fracture but showed heel spurs. Since that time she's continued to have constant burning pain in her right foot which radiates from the front of her ankle and around to the medial side of her foot. She has tried soaking it, icey freeze, elevation and ibuprofen these help with the pain but do not relieve it completely. She denies any numbness or tingling in her feet and her pain is not worse at any one point in the day.   Please see problem list for status of the pt's chronic medical problems.  Past Medical History:  Diagnosis Date  . Arthritis    Knees  . 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 S/p lumpectomy and radiation.  Marland Kitchen CAD (coronary artery disease) 11/07   cath 11/11 :  3V CADsee report   . Chronic pain    MR 08/07/10 :  Spondylosis L4-5 with B foraminal narrowing and L4 nerve root enchroachment B.  . Chronic venous insufficiency 2012   Has had full W/U incl ECHO, LFT's, Cr, Umicro, TSH, BNP. Symptomatic treatment.  . Colonic polyp 1995   Colonoscopies 1994, 2000, and 02/02/2011. Last had 9 polyps - Tubular adenoma and tubulovillous was the pathology results without high grade dysplasia  . Diabetes mellitus type II    Insulin dependent. Has worked with [name] R previously.  . Diastolic CHF, chronic ([place])    NL EF by echo 01/2012, Gr I dd  . Essential hypertension    Requires 5 drug therapy and still difficult to control.  Marland Kitchen GERD (gastroesophageal reflux disease)   . Hyperlipidemia    On daily statin  . Iron deficiency anemia    Ferritin was 12 in 2012. on FeSo4. Has had colon and EGD 02/02/11 that  showed mild gastritis and colon polyps.  . Obesity    Morbid. HAs worked with [name] T previously.  . OSA (obstructive sleep apnea) 02/16/2007   02/2007 : Moderate AHI 35.6, O2 sat decreased to 65%, CPAP titration to 16 with AHI of 3.6. 02/2014 : Rare respiratory events with sleep disturbance, within normal limits. AHI 0.4 per hour      . Seasonal allergies    seasonal    Review of Systems:  Review of Systems  Musculoskeletal: Negative for joint pain and myalgias.  Neurological: Negative for dizziness, tingling, weakness and headaches.    Physical Exam:  Vitals:   08/06/16 1039  BP: (!) 131/54  Pulse: 70  Temp: 97.8 F (36.6 C)  TempSrc: Oral  SpO2: 100%  Weight: 218 lb (98.9 kg)  Height: 5\' 1"  (1.549 m)   Physical Exam  Constitutional:  Antalgic gait   Cardiovascular: Normal rate and regular rhythm.   No murmur heard. Musculoskeletal: She exhibits edema and tenderness.  bilateral ankle swelling with 1+ pitting edema over both lower legs and tenderness to palpation over her right anterior and medial angle  No other swollen or tender joints     Assessment & Plan:   See Encounters Tab for problem based charting.   Patient seen with Dr. Dareen Piano

## 2016-08-06 NOTE — Assessment & Plan Note (Addendum)
At times she continues to have low blood sugar checks, the lowest of which was 97 since her last office visit. She has not had episodes of lightheadedness or weakness. Her highest blood sugar is 193 in the morning, she thinks this is because she eats too late in the evening. Given her history of symptomatic hypoglycemia in the past I am hesitant to change her insulin dosing  HbA1c 06/24/16 8.0  LDL 02/2016 125 with goal <100. She has been on a statin in the past but had myalgia.  BP 131/54 is better than goal  Urine Microalbumin/creatinine ratio 07/2015 16.9 Foot exam today - no ulcers, normal monofilament testing today she denies any numbness or tingling in her feet  Opthalmology exam 03/2016 - mild bilateral hypertensive retinopathy   Continue 70/30 20 units in the morning, 25 units before dinner. And meetings with [name].  Recheck A1c in December Completed form for diabetic shoes

## 2016-08-06 NOTE — Assessment & Plan Note (Addendum)
In August of this year she fell on her right ankle. He had an appointment in the clinic soon after x-rays were negative for acute fracture but showed heel spurs. Since that time she's continued to have constant burning pain in her right foot which radiates from the front of her ankle and around to the medial side of her foot. She has tried soaking it, bengay, elevation and ibuprofen these help with the pain but do not relieve it completely. On exam she has bilateral ankle and leg swelling with tenderness to palpation. Some of this pain may be related to the chronic leg swelling that she has. She may have a tendon injury, which can be evaluated with MRI imaging if her pain does not improve. She denies any numbness or tingling in her feet and her pain is not worse at any one point in the day.   Encouraged to try wrapping the right ankle, continue taking ibuprofen and resting the ankle whenever possible  Scheduled for follow up in 3 months and will call if the pain worsens or she develops new symptoms

## 2016-08-18 ENCOUNTER — Telehealth: Payer: Self-pay | Admitting: Dietician

## 2016-08-18 NOTE — Telephone Encounter (Signed)
She called to say she will not be able to make her visit tomorrow

## 2016-08-18 NOTE — Telephone Encounter (Signed)
Left voicemail to remind patient of her appointment tomorrow

## 2016-08-19 ENCOUNTER — Ambulatory Visit: Payer: Medicare Other | Admitting: Dietician

## 2016-09-04 ENCOUNTER — Other Ambulatory Visit: Payer: Self-pay | Admitting: Cardiology

## 2016-09-06 ENCOUNTER — Other Ambulatory Visit: Payer: Self-pay | Admitting: Cardiology

## 2016-09-06 NOTE — Telephone Encounter (Signed)
Rx(s) sent to pharmacy electronically.  

## 2016-09-07 ENCOUNTER — Ambulatory Visit (INDEPENDENT_AMBULATORY_CARE_PROVIDER_SITE_OTHER): Payer: Medicare Other | Admitting: Internal Medicine

## 2016-09-07 ENCOUNTER — Encounter (INDEPENDENT_AMBULATORY_CARE_PROVIDER_SITE_OTHER): Payer: Self-pay

## 2016-09-07 VITALS — BP 102/57 | HR 82 | Temp 98.0°F | Ht 61.0 in | Wt 217.5 lb

## 2016-09-07 DIAGNOSIS — Z7982 Long term (current) use of aspirin: Secondary | ICD-10-CM | POA: Diagnosis not present

## 2016-09-07 DIAGNOSIS — L910 Hypertrophic scar: Secondary | ICD-10-CM

## 2016-09-07 NOTE — Assessment & Plan Note (Signed)
Patient had an epidermal inclusion cyst at her right lateral chest wall beneath her breast which was excised by general surgery in January 2017. She says it has healed fairly well since that time, but she has had intermittent pain, swelling, and itching at the area which has not improved with topical steroid cream or silicone sheet.  Since last week she has had increased swelling at the surgical site with sharp, burning pains, pruritus, and warmth to touch. She feels like there is something inside "poking her." Pain limits her ability to lay on her right side. She has not noticed any drainage or discharge from the area. She has used warm compresses, soaps, and ice packs with reduction in her swelling. She is concerned about underlying infection. She denies any fevers, chills, or open wound at the site.  Surgical Pathology showed that she had an epidermal inclusion cyst, which may be recurring at her surgical site and causing her symptoms. There is no evidence of infection or abscess at the site on exam. Will refer patient to Dermatology for possible intralesional therapy as topical steroids and silicone sheets have not provided relief.

## 2016-09-07 NOTE — Progress Notes (Signed)
Internal Medicine Clinic Attending  I saw and evaluated the patient.  I personally confirmed the key portions of the history and exam documented by Dr. Zada Finders and I reviewed pertinent patient test results.  The assessment, diagnosis, and plan were formulated together and I agree with the documentation in the resident's note.  Right chest wall pain associated with inclusion cyst excision several months ago. The scar looks excellent, well healed, a little hypertrophic but no persistent wound. Pathology confirms this was an inclusion cyst, which have high recurrence rates. I think this sensation of fullness is recurrent inclusion cysts in the area. I don't feel any today on exam. Could confirm in the future with ultrasound or MRI.

## 2016-09-07 NOTE — Patient Instructions (Addendum)
It was a pleasure to meet you [name].  I will refer you to a Dermatologist for further evaluation and management of your scar lesion and pain.  Please continue the warm compresses for swelling.  Please let us know if there is worsening swelling, rash, or you begin to have fevers or chills.

## 2016-09-07 NOTE — Progress Notes (Signed)
CC: Right surgical scar swelling and pain  HPI:  [name] is a 65 y.o. female with PMH as listed below who presents for management of a hypertrophic scar.  Patient had an epidermal inclusion cyst at her right lateral chest wall beneath her breast which was excised by general surgery in January 2017. She says it has healed fairly well since that time, but she has had intermittent pain, swelling, and itching at the area which has not improved with topical steroid cream or silicone sheet.  Since last week she has had increased swelling at the surgical site with sharp, burning pains, pruritus, and warmth to touch. She feels like there is something inside "poking her." Pain limits her ability to lay on her right side. She has not noticed any drainage or discharge from the area. She has used warm compresses, soaps, and ice packs with reduction in her swelling. She is concerned about underlying infection. She denies any fevers, chills, or open wound at the site.   Past Medical History:  Diagnosis Date  . Arthritis    Knees  . 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 S/p lumpectomy and radiation.  Marland Kitchen CAD (coronary artery disease) 11/07   cath 11/11 :  3V CADsee report   . Chronic pain    MR 08/07/10 :  Spondylosis L4-5 with B foraminal narrowing and L4 nerve root enchroachment B.  . Chronic venous insufficiency 2012   Has had full W/U incl ECHO, LFT's, Cr, Umicro, TSH, BNP. Symptomatic treatment.  . Colonic polyp 1995   Colonoscopies 1994, 2000, and 02/02/2011. Last had 9 polyps - Tubular adenoma and tubulovillous was the pathology results without high grade dysplasia  . Diabetes mellitus type II    Insulin dependent. Has worked with [name] previously.  . Diastolic CHF, chronic ([place])    NL EF by echo 01/2012, Gr I dd  . Essential hypertension    Requires 5 drug therapy and still difficult to control.  Marland Kitchen GERD (gastroesophageal reflux  disease)   . Hyperlipidemia    On daily statin  . Iron deficiency anemia    Ferritin was 12 in 2012. on FeSo4. Has had colon and EGD 02/02/11 that showed mild gastritis and colon polyps.  . Obesity    Morbid. HAs worked with [name] previously.  . OSA (obstructive sleep apnea) 02/16/2007   02/2007 : Moderate AHI 35.6, O2 sat decreased to 65%, CPAP titration to 16 with AHI of 3.6. 02/2014 : Rare respiratory events with sleep disturbance, within normal limits. AHI 0.4 per hour      . Seasonal allergies    seasonal    Review of Systems:   Review of Systems  Constitutional: Negative for chills, diaphoresis and fever.  Skin:       Right lateral chest wall surgical scar with pain, swelling, pruritis. No open sore or discharge.  Neurological: Negative for loss of consciousness.     Physical Exam:  Vitals:   09/07/16 0838  BP: (!) 120/59  Pulse: 74  Temp: 98 F (36.7 C)  TempSrc: Oral  SpO2: 99%  Weight: 217 lb 8 oz (98.7 kg)  Height: 5\' 1"  (1.549 m)   Physical Exam  Constitutional: She appears well-developed and well-nourished. No distress.  Skin: She is not diaphoretic.  Hypertrophic scar at right lateral chest wall, firm to touch with mild tenderness, no surrounding erythema, fluctuance, or increased warmth to touch. No skin  break or active discharge.    Assessment & Plan:   See Encounters Tab for problem based charting.  Patient discussed with Dr. Evette Doffing

## 2016-09-08 ENCOUNTER — Other Ambulatory Visit: Payer: Self-pay | Admitting: *Deleted

## 2016-09-08 MED ORDER — METFORMIN HCL 1000 MG PO TABS: ORAL_TABLET | ORAL | 3 refills | Status: DC

## 2016-09-08 NOTE — Telephone Encounter (Signed)
Feb / MArch appt with me DM F/U. thanks

## 2016-09-08 NOTE — Telephone Encounter (Signed)
charsetta pt needs appt 

## 2016-09-14 ENCOUNTER — Other Ambulatory Visit: Payer: Self-pay | Admitting: Internal Medicine

## 2016-09-23 ENCOUNTER — Ambulatory Visit (INDEPENDENT_AMBULATORY_CARE_PROVIDER_SITE_OTHER): Payer: Medicare Other | Admitting: Dietician

## 2016-09-23 DIAGNOSIS — Z713 Dietary counseling and surveillance: Secondary | ICD-10-CM | POA: Diagnosis not present

## 2016-09-23 DIAGNOSIS — Z794 Long term (current) use of insulin: Secondary | ICD-10-CM | POA: Diagnosis not present

## 2016-09-23 DIAGNOSIS — E118 Type 2 diabetes mellitus with unspecified complications: Secondary | ICD-10-CM

## 2016-09-23 DIAGNOSIS — E1140 Type 2 diabetes mellitus with diabetic neuropathy, unspecified: Secondary | ICD-10-CM

## 2016-09-23 NOTE — Progress Notes (Signed)
Medical Nutrition Therapy:  Appt start time: [id number] end time:  1115. Last visit 07/2016  Assessment: Primary concerns today: healthy meal plan, glycemic control and weight loss  Patient attended Group Medical Nutrition Therapy visit today. Education included successes, challenges, goals,  participation in a healthy cooking demonstration about preparing foods with healthy fats. She is encouraged that she has found that her back pain is improved by some stretches.  Her daughter is now on Victoza and she is thinking it might be good for her also.   Her goal is to: drink less water and walk for 30 minutes Follow up of previous goal to not stress over her weight- she rated herself as doing well with this,   SELF MONITORING-per meter download-[id number] for past month. her pattern is rising over the course of the day 136/142 in the am to 161/171 midday to 194/217 in the PM.  She reports a blood sugar in the 30s that required the help of others and about 3 others that caught her by surprise that are not on her meter- the one documented is happened between 5 and 630 PM. . average now 161- decreased from 174 Labs: most recent A1C is 8% DIETARY INTAKE: Not assessed in detail Weight- 220.# increased  ACTIVITY: is interested in group activity, but has barriers of transportation and lack of material resources, she has been walking some and stretching on her own.   Progress Towards Goal(s):  No progress.   Nutritional Diagnosis:   [place] 2.2 altered nutrition related laboratory values of A1C of >7% as related to higher blood sugar remains about the same as evidenced her A1C of 8%  Which is above goal..    Intervention: Group Nutrition counseling and education about healthy options for easy and healthy coking/food options. Coordination of care: consider basal insulin and victoza for safer regimen that may aid in weight loss and improve blood sugar control.   Barriers to learning/adherence to lifestyle change:  competing values, lack of support  Demonstrated degree of understanding via: Teach Back   Monitoring/Evaluation: Dietary intake, exercise, meter and body weight in 4 weeks by phone and 8 weeks for group(s)  Koston Hennes, [name], RD 09/23/2016 1:12 PM.

## 2016-09-29 ENCOUNTER — Other Ambulatory Visit: Payer: Self-pay | Admitting: Cardiology

## 2016-10-14 DIAGNOSIS — L910 Hypertrophic scar: Secondary | ICD-10-CM | POA: Diagnosis not present

## 2016-10-28 ENCOUNTER — Other Ambulatory Visit: Payer: Self-pay | Admitting: Cardiovascular Disease

## 2016-10-28 ENCOUNTER — Other Ambulatory Visit: Payer: Self-pay | Admitting: Internal Medicine

## 2016-10-28 NOTE — Telephone Encounter (Signed)
Rx request sent to pharmacy.  

## 2016-10-28 NOTE — Telephone Encounter (Signed)
Would you please verify with her that she is still using this because I had taken it off of her medicine list at her last appointment. thanks

## 2016-11-04 ENCOUNTER — Ambulatory Visit (INDEPENDENT_AMBULATORY_CARE_PROVIDER_SITE_OTHER): Payer: Medicare Other | Admitting: Internal Medicine

## 2016-11-04 VITALS — BP 127/52 | HR 60 | Temp 98.2°F | Wt 221.1 lb

## 2016-11-04 DIAGNOSIS — I2510 Atherosclerotic heart disease of native coronary artery without angina pectoris: Secondary | ICD-10-CM | POA: Diagnosis not present

## 2016-11-04 DIAGNOSIS — M25571 Pain in right ankle and joints of right foot: Secondary | ICD-10-CM | POA: Diagnosis not present

## 2016-11-04 DIAGNOSIS — Z6841 Body Mass Index (BMI) 40.0 and over, adult: Secondary | ICD-10-CM | POA: Diagnosis not present

## 2016-11-04 DIAGNOSIS — E1140 Type 2 diabetes mellitus with diabetic neuropathy, unspecified: Secondary | ICD-10-CM

## 2016-11-04 DIAGNOSIS — Z794 Long term (current) use of insulin: Secondary | ICD-10-CM | POA: Diagnosis not present

## 2016-11-04 DIAGNOSIS — I10 Essential (primary) hypertension: Secondary | ICD-10-CM

## 2016-11-04 DIAGNOSIS — I872 Venous insufficiency (chronic) (peripheral): Secondary | ICD-10-CM | POA: Diagnosis not present

## 2016-11-04 DIAGNOSIS — G894 Chronic pain syndrome: Secondary | ICD-10-CM

## 2016-11-04 DIAGNOSIS — Z79899 Other long term (current) drug therapy: Secondary | ICD-10-CM

## 2016-11-04 DIAGNOSIS — E7849 Other hyperlipidemia: Secondary | ICD-10-CM

## 2016-11-04 DIAGNOSIS — Z7982 Long term (current) use of aspirin: Secondary | ICD-10-CM

## 2016-11-04 DIAGNOSIS — E11649 Type 2 diabetes mellitus with hypoglycemia without coma: Secondary | ICD-10-CM | POA: Diagnosis not present

## 2016-11-04 DIAGNOSIS — D508 Other iron deficiency anemias: Secondary | ICD-10-CM | POA: Diagnosis not present

## 2016-11-04 DIAGNOSIS — G8929 Other chronic pain: Secondary | ICD-10-CM

## 2016-11-04 DIAGNOSIS — D509 Iron deficiency anemia, unspecified: Secondary | ICD-10-CM

## 2016-11-04 DIAGNOSIS — G4733 Obstructive sleep apnea (adult) (pediatric): Secondary | ICD-10-CM

## 2016-11-04 DIAGNOSIS — E785 Hyperlipidemia, unspecified: Secondary | ICD-10-CM | POA: Diagnosis not present

## 2016-11-04 DIAGNOSIS — I25118 Atherosclerotic heart disease of native coronary artery with other forms of angina pectoris: Secondary | ICD-10-CM

## 2016-11-04 LAB — GLUCOSE, CAPILLARY: GLUCOSE-CAPILLARY: 92 mg/dL (ref 65–99)

## 2016-11-04 LAB — POCT GLYCOSYLATED HEMOGLOBIN (HGB A1C): Hemoglobin A1C: 7.3

## 2016-11-04 MED ORDER — INSULIN GLARGINE 100 UNIT/ML SOLOSTAR PEN: 25.0000 [IU] | PEN_INJECTOR | Freq: Every day | SUBCUTANEOUS | 3 refills | Status: DC

## 2016-11-04 MED ORDER — LIRAGLUTIDE 18 MG/3ML ~~LOC~~ SOPN: 0.6000 mg | PEN_INJECTOR | Freq: Every day | SUBCUTANEOUS | 5 refills | Status: DC

## 2016-11-04 MED ORDER — INSULIN DETEMIR 100 UNIT/ML FLEXPEN: 25.0000 [IU] | PEN_INJECTOR | Freq: Every day | SUBCUTANEOUS | 3 refills | Status: DC

## 2016-11-04 MED ORDER — LOSARTAN POTASSIUM 50 MG PO TABS: ORAL_TABLET | ORAL | 3 refills | Status: DC

## 2016-11-04 NOTE — Assessment & Plan Note (Addendum)
This problem is chronic but unstable. Her A1c trend is 7.9 - 8 - 7.3. She has her meter today and she has 1 sugar in the early morning that is 91 and she states she had hypoglycemic symptoms at that time. Her morning sugars are between 130 and 140. Her sugars increase after lunch to 200 in the afternoon and remains elevated for the rest of the evening and the 170s and 180s. She states that in December she had a sugar that was in the 20s or 30s and was quite symptomatic. She required assistance training and resolving that hypoglycemic episode. Her insulin regimen is 70/30 25 units in the morning and 29 night. She is also on metformin 1000 twice a day.  Assessment  : Poorly controlled diabetes significant hypoglycemia. Due to her comorbidities her A1c is not at goal. Her hypoglycemia is likely due to the 70/30 insulin not being tailored to her oral intake.   PLAN : Stop 70/30 Levemir 25 units once a day Victosa 0.6 once a day Coordinated with outpatient pharmacist for teaching Coordinated with [name], diabetes educator, to follow-up with so in one week Likely will need titration up of both medications Continue metformin at this time   Message from [name] 4/26 : Her blood sugars are ~ 150-180 with average past 30 days 170, lowest 111 one time all others > 130.  Could consider decrease in levemir if needed to prevent lows.  Increased liraglutide to 1.8 mg QD and decreased levemir to 23.

## 2016-11-04 NOTE — Assessment & Plan Note (Signed)
This problem is chronic and stable. She continues to complain of bilateral lower extremity edema. It is unchanged today. She does have fullness around the ankles right greater than left and some pitting edema pretibially. She is on Lasix as needed.  PLAN:  Cont current meds

## 2016-11-04 NOTE — Assessment & Plan Note (Signed)
This problem is chronic and stable. She is on amlodipine 10, Toprol 100, losartan 50, Lasix 20 when necessary, and imdur 60. She states that sometimes she gets dizzy and lightheaded and if she is at home she checks her sugar and it is normal. She then checks her blood pressure and her diastolic is "low". Her prior blood pressure measured in December was low at 102/52. However she is also having documented hypoglycemia. At this point I would like to and see if the spells resolve. I had already previously stopped her clonidine and response to the symptoms.  PLAN:  Cont current meds   BP Readings from Last 3 Encounters:  11/04/16 (!) 127/52  09/07/16 (!) 102/57  08/06/16 (!) 131/54

## 2016-11-04 NOTE — Progress Notes (Signed)
   Subjective:    Patient ID: [name], female    DOB: [birth year], 66 y.o.   MRN: [id number]  HPI  [name] is here for DM F/U. Please see the A&P for the status of the pt's chronic medical problems.  ROS : per ROS section and in problem oriented charting. All other systems are negative.  PMHx, Soc hx, and / or Fam hx : daughter still living with her and came to appt today. Surgery removed inclusion cyst which then became keloid and derm did steroid injection which helped.   Review of Systems  Constitutional: Negative for unexpected weight change.  HENT: Positive for sneezing. Negative for rhinorrhea.   Eyes: Positive for itching and visual disturbance.  Cardiovascular: Positive for chest pain and leg swelling.       Wakes up at night smothering   Musculoskeletal: Positive for arthralgias and gait problem.  Neurological: Positive for dizziness and light-headedness.       Objective:   Physical Exam  Constitutional: She appears well-developed and well-nourished. No distress.  HENT:  Head: Normocephalic and atraumatic.  Right Ear: External ear normal.  Left Ear: External ear normal.  Nose: Nose normal.  Eyes: Conjunctivae and EOM are normal.  Cardiovascular: Normal rate, regular rhythm and normal heart sounds.   Musculoskeletal: She exhibits edema.  R > L, trace pre-tibial edema, ankles non pitting edema  Skin: Skin is warm and dry. She is not diaphoretic.  Psychiatric: She has a normal mood and affect. Her behavior is normal. Judgment normal.          Assessment & Plan:

## 2016-11-04 NOTE — Assessment & Plan Note (Signed)
This problem is chronic and stable. She is on a beta blocker, imdur, and aspirin. She is statin intolerant. She sees cardiology. She continues to use her nitroglycerin patch and also uses nitroglycerin sublingually 2-3 times per week. Only occasionally does she require a second dose of sublingual nitroglycerin.  PLAN:  Cont current meds Follow-up with cardiology

## 2016-11-04 NOTE — Assessment & Plan Note (Signed)
This problem is chronic and stable. She is statin intolerant and has been on since November 2016. Her LDL after starting Zetia was 125. She has no side effects to zetia.  PLAN:  Cont current meds Check lipid panel today

## 2016-11-04 NOTE — Telephone Encounter (Addendum)
Discussed during 11/04/2016 office visit w/pcp.[name]2/1/20184:52 PM

## 2016-11-04 NOTE — Patient Instructions (Addendum)
1. Stop 70/30 insulin once you have the 2 new diabetes meds 2. Start lantus 25 units once in evening 3. Take Victosa 0.6 once daily 4. [name] will call you one week

## 2016-11-04 NOTE — Assessment & Plan Note (Signed)
This problem is chronic and stable. We reviewed her hemoglobin and ferritin results. Hemoglobin was 11.4 and ferritin in the 40s. She does not take iron supplementation.  PLAN : CBC and ferritin today

## 2016-11-04 NOTE — Assessment & Plan Note (Signed)
This problem is chronic stable. She has worked extensively with our nutritionist. She has not been accessed for in losing significant weight. We discussed that insulin will possibly increase weight and therefore we will change her insulin and add liraglutide.  PLAN : continue to follow weight  Wt Readings from Last 3 Encounters:  11/04/16 221 lb 1.6 oz (100.3 kg)  09/23/16 220 lb (99.8 kg)  09/07/16 217 lb 8 oz (98.7 kg)

## 2016-11-04 NOTE — Assessment & Plan Note (Signed)
This problem is chronic and improved. Her right ankle over better although she still occasionally has shooting pain from the lateral malleolus to the medial malleolus. She has used wraps, soaks, and elevation.  PLAN : Follow

## 2016-11-04 NOTE — Assessment & Plan Note (Signed)
This problem is chronic stable. Her 2008 sleep study was diagnostic for moderate sleep apnea. Her 2015 sleep study did not show sleep apnea. She states this was because she was unable to sleep during the 2015 sleep study she does snore but does not have morning headaches. Her body habitus is consistent for high risk of obstructive sleep apnea. She is agreeable to repeating the sleep study.  Plan : Split night sleep study

## 2016-11-05 ENCOUNTER — Encounter: Payer: Self-pay | Admitting: Internal Medicine

## 2016-11-05 LAB — FERRITIN: Ferritin: 73 ng/mL (ref 15–150)

## 2016-11-05 LAB — BMP8+ANION GAP
ANION GAP: 14 mmol/L (ref 10.0–18.0)
BUN/Creatinine Ratio: 14 (ref 12–28)
BUN: 12 mg/dL (ref 8–27)
CO2: 24 mmol/L (ref 18–29)
Calcium: 9.4 mg/dL (ref 8.7–10.3)
Chloride: 102 mmol/L (ref 96–106)
Creatinine, Ser: 0.87 mg/dL (ref 0.57–1.00)
GFR calc Af Amer: 81 mL/min/{1.73_m2} (ref >59)
GFR, EST NON AFRICAN AMERICAN: 70 mL/min/{1.73_m2} (ref >59)
Glucose: 67 mg/dL (ref 65–99)
Potassium: 3.7 mmol/L (ref 3.5–5.2)
SODIUM: 140 mmol/L (ref 134–144)

## 2016-11-05 LAB — CBC
Hematocrit: 36.1 % (ref 34.0–46.6)
Hemoglobin: 11.5 g/dL (ref 11.1–15.9)
MCH: 28.4 pg (ref 26.6–33.0)
MCHC: 31.9 g/dL (ref 31.5–35.7)
MCV: 89 fL (ref 79–97)
Platelets: 256 10*3/uL (ref 150–379)
RBC: 4.05 x10E6/uL (ref 3.77–5.28)
RDW: 15.6 % — ABNORMAL HIGH (ref 12.3–15.4)
WBC: 6.6 10*3/uL (ref 3.4–10.8)

## 2016-11-05 LAB — LIPID PANEL
CHOLESTEROL TOTAL: 239 mg/dL — AB (ref 100–199)
Chol/HDL Ratio: 4 ratio units (ref 0.0–4.4)
HDL: 60 mg/dL (ref >39)
LDL Calculated: 161 mg/dL — ABNORMAL HIGH (ref 0–99)
Triglycerides: 90 mg/dL (ref 0–149)
VLDL CHOLESTEROL CAL: 18 mg/dL (ref 5–40)

## 2016-11-15 ENCOUNTER — Telehealth: Payer: Self-pay | Admitting: Dietician

## 2016-11-15 MED ORDER — LIRAGLUTIDE 18 MG/3ML ~~LOC~~ SOPN: 1.2000 mg | PEN_INJECTOR | Freq: Every day | SUBCUTANEOUS | 5 refills | Status: DC

## 2016-11-15 NOTE — Telephone Encounter (Signed)
Thank you. I changed it on med list to 1.2

## 2016-11-15 NOTE — Telephone Encounter (Signed)
Called patient about her new diabetes medicines. She was out so did not have specific readings but says she is  "Doing good, except when she eats bread, rice or pasta" She insisted her Victoza dose is "0.1" and   levemir 25 units daily. Says he blood sugars have been  120-128-118 except one time when she ate a few slices of bread and it went to the high 200s.   She is willing to increase the Victoza to 1.2 mg/day per Dr. Zenovia Jarred orders.

## 2016-11-16 NOTE — Telephone Encounter (Signed)
Called patient and let her know to increase the Victoza.   She asked why her Levemir directions reads take at 10 Pm? The pharmacist told her she could take it in the am with her Victoza so she takes both in the morning.I told her that it should be fine to take the levemir in the morning.    She and her daughter will not be attending diabetes group meeting this month, but plan to attend next month.

## 2016-11-16 NOTE — Telephone Encounter (Signed)
OK with me to take in AM

## 2016-11-29 ENCOUNTER — Other Ambulatory Visit: Payer: Self-pay | Admitting: Internal Medicine

## 2016-11-29 ENCOUNTER — Other Ambulatory Visit: Payer: Self-pay | Admitting: Cardiology

## 2016-11-30 NOTE — Telephone Encounter (Signed)
We stopped the 70/30 earlier this month

## 2016-12-03 ENCOUNTER — Other Ambulatory Visit: Payer: Self-pay | Admitting: *Deleted

## 2016-12-03 ENCOUNTER — Other Ambulatory Visit: Payer: Self-pay | Admitting: Internal Medicine

## 2016-12-03 MED ORDER — GLUCOSE BLOOD VI STRP: ORAL_STRIP | 6 refills | Status: DC

## 2016-12-03 NOTE — Addendum Note (Signed)
Addended by: Larey Dresser A on: 12/03/2016 01:52 PM   Modules accepted: Orders

## 2016-12-03 NOTE — Telephone Encounter (Signed)
We stopped 70/30 and started levemir

## 2016-12-03 NOTE — Telephone Encounter (Signed)
Pharmacy states need new rx per Medicare every 6 months.

## 2016-12-16 ENCOUNTER — Ambulatory Visit (INDEPENDENT_AMBULATORY_CARE_PROVIDER_SITE_OTHER): Payer: Medicare Other | Admitting: Dietician

## 2016-12-16 DIAGNOSIS — Z6841 Body Mass Index (BMI) 40.0 and over, adult: Secondary | ICD-10-CM

## 2016-12-16 DIAGNOSIS — Z713 Dietary counseling and surveillance: Secondary | ICD-10-CM

## 2016-12-16 DIAGNOSIS — E1140 Type 2 diabetes mellitus with diabetic neuropathy, unspecified: Secondary | ICD-10-CM | POA: Diagnosis not present

## 2016-12-16 DIAGNOSIS — Z794 Long term (current) use of insulin: Secondary | ICD-10-CM

## 2016-12-16 NOTE — Progress Notes (Signed)
Medical Nutrition Therapy:  Appt start time: 7412 end time:  1115. Last visit 09/23/17  Assessment: Primary concerns today: healthy meal plan, glycemic control and tog et off some medicine Patient attended Group Medical Nutrition Therapy visit today. Education included successes, challenges, goals,  participation in a [place] discussion about diabetes myths vs facts.    Her goal is to: move more, drink more water and get off of some meds Follow up of previous goal  Not done today.   SELF MONITORING-did not bring meter today Labs: most recent A1C is 7.3% DIETARY INTAKE: Not assessed in detail Weight- weighs at home, last weigh increased to 221# with BMI~ 42  Progress Towards Goal(s):  No progress.   Nutritional Diagnosis:   [place] 2.2 altered nutrition related laboratory values of A1C of >7% as related to higher blood sugar remains about the same as evidenced her A1C of 7.3%.  Intervention: Group Nutrition counseling and education about healthy options for easy and healthy coking/food options.    Barriers to learning/adherence to lifestyle change: competing values, lack of support  Demonstrated degree of understanding via: Teach Back   Monitoring/Evaluation: Dietary intake, exercise, meter and body weight in 4 weeks for group(s)  Fantasha Daniele, [name], RD 12/16/2016 5:42 PM.

## 2016-12-24 ENCOUNTER — Telehealth: Payer: Self-pay | Admitting: Dietician

## 2016-12-24 NOTE — Telephone Encounter (Signed)
Called Ms. Towe to follow up on new diabetes regimen and provide support in between visits. She says for the most part her blood sugars are well controlled, except when she eats something sweet at night, her blood sugar will be 200 in the morning. She said she has had a low blood sugar in the 70s and didn't feel bad with it.  She doesn't like that  Victoza makes her feel full, and she cannot eat as much as she feels she needs. If she does eat more, she feels bad. I encouraged her to eat small amounts when she is hungry and to have snacks as needed. (She doesn't like snacks). Encouraged her to give Victoza a chance explaining that it is has beneficial side effects and her new regimen is less likely to cause low blood sugars.    She intends to attend group diabetes meeting next month. She asked about group for her oldest daughter who also has diabetes and is not a patient at our practice. Will mail her a support group flyer for [facility] and encouraged her to have her daughter request a referral to them as well.Marland Kitchen

## 2016-12-24 NOTE — Telephone Encounter (Signed)
Thanks!  Front desk - pls sch DM F/U appt with me MAy

## 2016-12-27 ENCOUNTER — Other Ambulatory Visit: Payer: Self-pay | Admitting: Cardiology

## 2016-12-27 ENCOUNTER — Other Ambulatory Visit: Payer: Self-pay | Admitting: Internal Medicine

## 2016-12-27 ENCOUNTER — Encounter (HOSPITAL_BASED_OUTPATIENT_CLINIC_OR_DEPARTMENT_OTHER): Payer: Medicare Other

## 2016-12-27 NOTE — Telephone Encounter (Signed)
Rx(s) sent to pharmacy electronically.  

## 2016-12-28 ENCOUNTER — Telehealth: Payer: Self-pay | Admitting: Cardiology

## 2016-12-28 NOTE — Telephone Encounter (Signed)
Reviewed, cannot locate documentation that patient needs brand only. Patient has been prescribed generic in past. Informed pharmacy [facility] to fill for generic. Asked them to notify pt we have been trying to reach her to schedule overdue appt.

## 2016-12-28 NOTE — Telephone Encounter (Signed)
SUSAN at [facility]  [phone]   Can they use ZETIA generic for this pt.

## 2016-12-29 ENCOUNTER — Other Ambulatory Visit: Payer: Self-pay | Admitting: Internal Medicine

## 2016-12-29 NOTE — Telephone Encounter (Signed)
Called pt, dr hocherin fills the imdur, gave her his ph number and last time she saw him, encouraged her to make appt with his office, she agreed

## 2016-12-29 NOTE — Telephone Encounter (Signed)
Pt has questions about refill please call isosorbide mononitrate (IMDUR) 60 MG 24 hr tablet

## 2016-12-29 NOTE — Progress Notes (Signed)
The patient presents for follow up of  CAD.   He was hospitalized in November 2016 with chest discomfort. She was found to have 70% proximal RCA stenosis, first marginal 80% stenosis, mid LAD 70% stenosis, distal LAD 70% stenosis, distal circumflex 70% stenosis in the circumflex 60% stenosis. The EF was 55%.  However, it was decided to manage her medically at that time. She returns for follow up. She does get chest pain with moderate exertion and occasionally has to take a nitroglycerin. However, this is only when she is doing her exercises or walking long distances and has been very stable pattern since the time of her catheterization. She's not describing any resting chest discomfort, neck or arm discomfort.  She is not having any palpitations, presyncope or syncope.  She walks with a cane.    Allergies  Allergen Reactions  . Lisinopril Other (See Comments)    Chronic cough secondary to ACE  . Oxycodone-Acetaminophen Hives and Itching    flushing  . Atorvastatin Other (See Comments)    myalgias    Current Outpatient Prescriptions  Medication Sig Dispense Refill  . ACCU-CHEK FASTCLIX LANCETS MISC USE ONE LANCET THREE TIMES DAILY BEFORE MEALS AND BEFORE BED. DX CODE: E11.40 102 each 6  . amLODipine (NORVASC) 10 MG tablet TAKE ONE (1) TABLET BY MOUTH EVERY DAY 90 tablet 3  . aspirin EC 81 MG tablet Take 81 mg by mouth daily.    . furosemide (LASIX) 20 MG tablet Take 1 tablet (20 mg total) by mouth 2 (two) times daily as needed. 60 tablet 4  . glucose blood (ACCU-CHEK SMARTVIEW) test strip Check blood sugar up to 3 times daily  Dx code E11.40. Insulin dependent 100 each 6  . ibuprofen (ADVIL,MOTRIN) 800 MG tablet TAKE ONE TABLET BY MOUTH EVERY EIGHT HOURS AS NEEDED FOR PAIN 30 tablet 1  . Insulin Detemir (LEVEMIR FLEXPEN) 100 UNIT/ML Pen Inject 25 Units into the skin daily at 10 pm. 15 mL 3  . isosorbide mononitrate (IMDUR) 60 MG 24 hr tablet Take 1 tablet (60 mg total) by mouth daily.  NEEDS APPOINTMENT FOR FUTURE REFILLS 15 tablet 0  . liraglutide 18 MG/3ML SOPN Inject 0.2 mLs (1.2 mg total) into the skin daily. 15 mL 5  . loratadine (CLARITIN) 10 MG tablet TAKE ONE TABLET BY MOUTH ONCE DAILY FOR ALLERGIES 90 tablet 3  . losartan (COZAAR) 50 MG tablet TAKE ONE (1) TABLET BY MOUTH EVERY DAY 90 tablet 3  . metFORMIN (GLUCOPHAGE) 1000 MG tablet TAKE ONE (1) TABLET BY MOUTH TWO (2) TIMES DAILY WITH A MEAL 180 tablet 3  . metoprolol succinate (TOPROL-XL) 100 MG 24 hr tablet TAKE ONE (1) TABLET BY MOUTH EVERY DAY 90 tablet 3  . nitroGLYCERIN (NITRODUR - DOSED IN MG/24 HR) 0.4 mg/hr patch APPLY ONE PATCH ON SKIN EVERY DAY. LEAVE ON FOR 12 HOURS AND OFF FOR 12 HOURS. 100 patch 3  . Occlusive Silicone Sheets SHEE Cut to scar size. Apply to scar and adhere with tape. Leave on for at least 12 hrs. Use for at least 4 weeks or until scar better 1 each 1  . Olopatadine HCl (PATADAY) 0.2 % SOLN Place 1 drop into both eyes daily as needed. For allergies    . triamcinolone ointment (KENALOG) 0.1 % Apply 1 application topically 2 (two) times daily. Apply to keloid 2 times daily 30 g 0  . Turmeric, Curcuma Longa, POWD 1 scoop by Does not apply route daily.    Marland Kitchen  UNIFINE PENTIPS 31G X 5 MM MISC USE FOR INJECTIONS TWICE DAILY 100 each 11  . ZETIA 10 MG tablet Take 1 tablet (10 mg total) by mouth daily. Please call to schedule appointment for further refills, thanks!  Attempt #4 15 tablet 0  . nitroGLYCERIN (NITROSTAT) 0.4 MG SL tablet Place 1 tablet (0.4 mg total) under the tongue every 5 (five) minutes as needed for chest pain. 25 tablet 3   No current facility-administered medications for this visit.     Past Medical History:  Diagnosis Date  . Arthritis    Knees  . 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 S/p lumpectomy and radiation.  Marland Kitchen CAD (coronary artery disease) 11/07   cath 11/11 :  3V CADsee report   . Chronic pain    MR 08/07/10 :   Spondylosis L4-5 with B foraminal narrowing and L4 nerve root enchroachment B.  . Chronic venous insufficiency 2012   Has had full W/U incl ECHO, LFT's, Cr, Umicro, TSH, BNP. Symptomatic treatment.  . Colonic polyp 1995   Colonoscopies 1994, 2000, and 02/02/2011. Last had 9 polyps - Tubular adenoma and tubulovillous was the pathology results without high grade dysplasia  . Diabetes mellitus type II    Insulin dependent. Has worked with [name] R previously.  . Diastolic CHF, chronic ([place])    NL EF by echo 01/2012, Gr I dd  . Essential hypertension    Requires 5 drug therapy and still difficult to control.  Marland Kitchen GERD (gastroesophageal reflux disease)   . Hyperlipidemia    On daily statin  . [place] deficiency anemia    Ferritin was 12 in 2012. on FeSo4. Has had colon and EGD 02/02/11 that showed mild gastritis and colon polyps.  . Obesity    Morbid. HAs worked with [name] T previously.  . OSA (obstructive sleep apnea) 02/16/2007   02/2007 : Moderate AHI 35.6, O2 sat decreased to 65%, CPAP titration to 16 with AHI of 3.6. 02/2014 : Rare respiratory events with sleep disturbance, within normal limits. AHI 0.4 per hour      . Seasonal allergies    seasonal    Past Surgical History:  Procedure Laterality Date  . BREAST LUMPECTOMY W/ NEEDLE LOCALIZATION  2006   34 Chemo RX  . CARDIAC CATHETERIZATION N/A 08/15/2015   Procedure: Left Heart Cath and Coronary Angiography;  Surgeon: Troy Sine, MD;  Location: [facility];  Service: Cardiovascular;  Laterality: N/A;  . COLONOSCOPY [place], 2000, 2012  . TOTAL ABDOMINAL HYSTERECTOMY  1995   2/2 uterine cancer    ROS: Back pain and sciatic pain.  Otherwise as stated in the HPI and negative for all other systems.   PHYSICAL EXAM BP 127/72   Pulse 70   Ht 5\' 1"  (1.549 m)   Wt 213 lb 9.6 oz (96.9 kg)   LMP 07/12/1984   BMI 40.36 kg/m  GENERAL:  Well appearing HEENT:  Pupils equal round and reactive, fundi not visualized, oral  mucosa unremarkable NECK:  No jugular venous distention, waveform within normal limits, carotid upstroke brisk and symmetric, no bruits, no thyromegaly LYMPHATICS:  No cervical, inguinal adenopathy LUNGS:  Clear to auscultation bilaterally BACK:  No CVA tenderness HEART:  PMI not displaced or sustained,S1 and S2 within normal limits, no S3, no S4, no clicks, no rubs, no murmurs ABD:  Flat, positive bowel sounds normal in frequency in pitch, no bruits, no  rebound, no guarding, no midline pulsatile mass, no hepatomegaly, no splenomegaly, obese EXT:  2 plus pulses throughout, mild leg edema, no cyanosis no clubbing.    EKG:   Sinus rhythm, rate 70, axis within normal limits, intervals within normal limits, no acute ST-T wave  Lab Results  Component Value Date   CHOL 239 (H) 11/04/2016   TRIG 90 11/04/2016   HDL 60 11/04/2016   LDLCALC 161 (H) 11/04/2016   LDLDIRECT 153 (H) 07/13/2011   Lab Results  Component Value Date   HGBA1C 7.3 11/04/2016     ASSESSMENT AND PLAN   CAD:  I will continue to manager her medically.  She needs aggressive risk reduction.  She had unstable angina pattern that has been unchanged since that time she was cath. She will continue on the meds as listed.  HTN:  The blood pressure is  at target. No change in medications is indicated. I advised therapeutic lifestyle changes (TLC).  HYPERLIPIDEMIA:   She has been intolerant of statins.  She is not at all at target but she refused a statin or PCSK9  DIABETES:  Her hemoglobin A1c was 7.3 which is improved from  8.6 in Nov.  She is following with Dr. Lynnae January.   OBESITY:   The patient understands the need to lose weight with diet and exercise. We have discussed specific strategies for this again today.

## 2016-12-30 ENCOUNTER — Encounter: Payer: Self-pay | Admitting: Gastroenterology

## 2016-12-30 ENCOUNTER — Ambulatory Visit (INDEPENDENT_AMBULATORY_CARE_PROVIDER_SITE_OTHER): Payer: Medicare Other | Admitting: Cardiology

## 2016-12-30 ENCOUNTER — Encounter: Payer: Self-pay | Admitting: Cardiology

## 2016-12-30 VITALS — BP 127/72 | HR 70 | Ht 61.0 in | Wt 213.6 lb

## 2016-12-30 DIAGNOSIS — I209 Angina pectoris, unspecified: Secondary | ICD-10-CM | POA: Diagnosis not present

## 2016-12-30 DIAGNOSIS — E118 Type 2 diabetes mellitus with unspecified complications: Secondary | ICD-10-CM

## 2016-12-30 DIAGNOSIS — Z794 Long term (current) use of insulin: Secondary | ICD-10-CM | POA: Diagnosis not present

## 2016-12-30 DIAGNOSIS — I25118 Atherosclerotic heart disease of native coronary artery with other forms of angina pectoris: Secondary | ICD-10-CM

## 2016-12-30 DIAGNOSIS — E785 Hyperlipidemia, unspecified: Secondary | ICD-10-CM | POA: Diagnosis not present

## 2016-12-30 NOTE — Patient Instructions (Signed)

## 2017-01-05 ENCOUNTER — Other Ambulatory Visit: Payer: Self-pay | Admitting: Internal Medicine

## 2017-01-05 ENCOUNTER — Other Ambulatory Visit: Payer: Self-pay | Admitting: Cardiology

## 2017-01-06 ENCOUNTER — Other Ambulatory Visit: Payer: Self-pay | Admitting: *Deleted

## 2017-01-06 MED ORDER — ACCU-CHEK FASTCLIX LANCETS MISC: 6 refills | Status: DC

## 2017-01-20 ENCOUNTER — Ambulatory Visit (INDEPENDENT_AMBULATORY_CARE_PROVIDER_SITE_OTHER): Payer: Medicare Other | Admitting: Pharmacist

## 2017-01-20 ENCOUNTER — Encounter: Payer: Self-pay | Admitting: Pharmacist

## 2017-01-20 VITALS — Wt 216.4 lb

## 2017-01-20 DIAGNOSIS — R739 Hyperglycemia, unspecified: Secondary | ICD-10-CM | POA: Diagnosis not present

## 2017-01-20 DIAGNOSIS — Z719 Counseling, unspecified: Secondary | ICD-10-CM

## 2017-01-20 DIAGNOSIS — Z713 Dietary counseling and surveillance: Secondary | ICD-10-CM | POA: Diagnosis present

## 2017-01-20 NOTE — Patient Instructions (Signed)
Patient educated about medication as defined in this encounter and verbalized understanding by repeating back instructions provided.   

## 2017-01-20 NOTE — Progress Notes (Addendum)
Medical Nutrition Therapy:  Appt start time: 6712 end time:  1115. Last visit 11/04/16  Assessment: Primary concerns today: healthy meal plan, glycemic control and to get off some medicine Patient attended Group Medical Nutrition Therapy visit today. Education included successes, challenges, goals,  participation in a [employer] discussion about monitoring blood glucose and symptoms of high and low blood glucose.    Her goal is to: Eat only healthy snacks, watch no more than 3 hours of TV a day, come off of the metformin Follow up of previous goal: Patient was able to walk more until recently due to pain in her legs.    SELF MONITORING-High was 264, low 111, average 170 Labs: most recent A1C is 7.3% (11/04/16) DIETARY INTAKE: Patient has been better about eating baked instead of fried food Weight- Today: 216.4lbs  Progress Towards Goal(s): Progress made.              Nutritional Diagnosis:    2.2 altered nutrition related laboratory values of A1C of >7% as related to higher blood sugar remains about the same as evidenced her A1C of 7.3%.  Intervention: Group Nutrition counseling and education about healthy options for easy and healthy coking/food options.    Barriers to learning/adherence to lifestyle change: pain in legs that keep her from moving a lot and being independent  Demonstrated degree of understanding via: Teach Back   Monitoring/Evaluation: Dietary intake, exercise, meter and body weight in 4 weeks for group(s)   Florinda Marker PharmD Candidate  Patient was seen in clinic with Florinda Marker, PharmD candidate. I agree with the assessment and plan of care documented.

## 2017-01-21 NOTE — Progress Notes (Signed)
reviewed

## 2017-01-31 ENCOUNTER — Other Ambulatory Visit: Payer: Self-pay | Admitting: Internal Medicine

## 2017-01-31 MED ORDER — LIRAGLUTIDE 18 MG/3ML ~~LOC~~ SOPN: 1.8000 mg | PEN_INJECTOR | Freq: Every day | SUBCUTANEOUS | 5 refills | Status: DC

## 2017-01-31 MED ORDER — INSULIN DETEMIR 100 UNIT/ML FLEXPEN: 23.0000 [IU] | PEN_INJECTOR | Freq: Every day | SUBCUTANEOUS | 3 refills | Status: DC

## 2017-02-02 ENCOUNTER — Telehealth: Payer: Self-pay | Admitting: Dietician

## 2017-02-04 NOTE — Telephone Encounter (Signed)
Called patient per Dr. Lynnae January and left her a message to increase the victoza to 1.8mg  and decrease levemir 23 units.

## 2017-02-07 ENCOUNTER — Other Ambulatory Visit: Payer: Self-pay | Admitting: Cardiology

## 2017-02-09 ENCOUNTER — Encounter: Payer: Self-pay | Admitting: Gastroenterology

## 2017-02-10 ENCOUNTER — Ambulatory Visit (HOSPITAL_BASED_OUTPATIENT_CLINIC_OR_DEPARTMENT_OTHER): Payer: Medicare Other | Attending: Internal Medicine | Admitting: Internal Medicine

## 2017-02-10 ENCOUNTER — Other Ambulatory Visit: Payer: Self-pay | Admitting: Cardiology

## 2017-02-10 VITALS — Ht 61.0 in | Wt 215.0 lb

## 2017-02-10 DIAGNOSIS — G4733 Obstructive sleep apnea (adult) (pediatric): Secondary | ICD-10-CM

## 2017-02-10 NOTE — Telephone Encounter (Signed)
REFILL 

## 2017-02-16 ENCOUNTER — Other Ambulatory Visit: Payer: Self-pay | Admitting: Internal Medicine

## 2017-02-16 DIAGNOSIS — G4733 Obstructive sleep apnea (adult) (pediatric): Secondary | ICD-10-CM

## 2017-02-16 NOTE — Procedures (Signed)
Patient Name: [name], [name] Date: 02/10/2017 Gender: Female D.O.B: 1951/01/16 Age (years): 1 Referring Provider: Bartholomew Crews Height (inches): 77 Interpreting Physician: Baird Lyons MD, ABSM Weight (lbs): 215 RPSGT: Carolin Coy BMI: 41 MRN: [id number] Neck Size: 15.50 CLINICAL INFORMATION Sleep Study Type: Split Night CPAP  Indication for sleep study: Diabetes, Fatigue, Hypertension, Obesity, Snoring  Epworth Sleepiness Score: 17  SLEEP STUDY TECHNIQUE As per the AASM Manual for the Scoring of Sleep and Associated Events v2.3 (April 2016) with a hypopnea requiring 4% desaturations.  The channels recorded and monitored were frontal, central and occipital EEG, electrooculogram (EOG), submentalis EMG (chin), nasal and oral airflow, thoracic and abdominal wall motion, anterior tibialis EMG, snore microphone, electrocardiogram, and pulse oximetry. Continuous positive airway pressure (CPAP) was initiated when the patient met split night criteria and was titrated according to treat sleep-disordered breathing.  MEDICATIONS Medications self-administered by patient taken the night of the study : none reported  RESPIRATORY PARAMETERS Diagnostic  Total AHI (/hr): 12.9 RDI (/hr): 26.2 OA Index (/hr): 1.7 CA Index (/hr): 0.0 REM AHI (/hr): 61.0 NREM AHI (/hr): 2.9 Supine AHI (/hr): N/A Non-supine AHI (/hr): 12.91 Min O2 Sat (%): 77.00 Mean O2 (%): 94.44 Time below 88% (min): 4.8   Titration  Optimal Pressure (cm): 15 AHI at Optimal Pressure (/hr): 0.0 Min O2 at Optimal Pressure (%): 93.0 Supine % at Optimal (%): 100 Sleep % at Optimal (%): 100   SLEEP ARCHITECTURE The recording time for the entire night was 400.5 minutes.  During a baseline period of 222.9 minutes, the patient slept for 172.0 minutes in REM and nonREM, yielding a sleep efficiency of 77.2%. Sleep onset after lights out was 19.3 minutes with a REM latency of 68.5 minutes. The patient spent 12.21% of  the night in stage N1 sleep, 70.64% in stage N2 sleep, 0.00% in stage N3 and 17.15% in REM.  During the titration period of 170.1 minutes, the patient slept for 142.2 minutes in REM and nonREM, yielding a sleep efficiency of 83.6%. Sleep onset after CPAP initiation was 23.4 minutes with a REM latency of 74.0 minutes. The patient spent 8.44% of the night in stage N1 sleep, 71.52% in stage N2 sleep, 0.00% in stage N3 and 20.04% in REM.  CARDIAC DATA The 2 lead EKG demonstrated sinus rhythm. The mean heart rate was 74.93 beats per minute. Other EKG findings include: None.  LEG MOVEMENT DATA The total Periodic Limb Movements of Sleep (PLMS) were 0. The PLMS index was 0.00 .  IMPRESSIONS - Mild obstructive sleep apnea occurred during the diagnostic portion of the study (AHI = 12.9 /hour). An optimal PAP pressure was selected for this patient ( 15 cm of water) - No significant central sleep apnea occurred during the diagnostic portion of the study (CAI = 0.0/hour). - Severe oxygen desaturation was noted during the diagnostic portion of the study (Min O2 = 77.00%). - The patient snored with Moderate snoring volume during the diagnostic portion of the study. - No cardiac abnormalities were noted during this study. - Clinically significant periodic limb movements did not occur during sleep.  DIAGNOSIS - Obstructive Sleep Apnea (327.23 [G47.33 ICD-10])  RECOMMENDATIONS - Trial of CPAP therapy on 15 cm H2O with a Medium size Resmed Full Face Mask AirFit F10 for Her mask and heated humidification. - Avoid alcohol, sedatives and other CNS depressants that may worsen sleep apnea and disrupt normal sleep architecture. - Sleep hygiene should be reviewed to assess factors that may improve sleep quality. -  Weight management and regular exercise should be initiated or continued.  [Electronically signed] 02/16/2017 02:24 PM  Baird Lyons MD, [place], American Board of Sleep Medicine   NPI:  [id number]  [place], [place] [facility]  ELECTRONICALLY SIGNED ON:  02/16/2017, 2:25 PM [place] PH: [phone]   FX: [phone] [place]

## 2017-02-17 ENCOUNTER — Ambulatory Visit: Payer: Medicare Other | Admitting: Dietician

## 2017-02-23 ENCOUNTER — Encounter: Payer: Self-pay | Admitting: Podiatry

## 2017-02-23 ENCOUNTER — Ambulatory Visit (INDEPENDENT_AMBULATORY_CARE_PROVIDER_SITE_OTHER): Payer: Medicare Other

## 2017-02-23 ENCOUNTER — Ambulatory Visit (INDEPENDENT_AMBULATORY_CARE_PROVIDER_SITE_OTHER): Payer: Medicare Other | Admitting: Podiatry

## 2017-02-23 ENCOUNTER — Encounter (HOSPITAL_BASED_OUTPATIENT_CLINIC_OR_DEPARTMENT_OTHER): Payer: Medicare Other

## 2017-02-23 VITALS — BP 140/74 | HR 69 | Resp 16

## 2017-02-23 DIAGNOSIS — M25571 Pain in right ankle and joints of right foot: Secondary | ICD-10-CM

## 2017-02-23 DIAGNOSIS — G8929 Other chronic pain: Secondary | ICD-10-CM | POA: Diagnosis not present

## 2017-02-23 DIAGNOSIS — I25118 Atherosclerotic heart disease of native coronary artery with other forms of angina pectoris: Secondary | ICD-10-CM | POA: Diagnosis not present

## 2017-02-23 NOTE — Progress Notes (Signed)
   Subjective:    Patient ID: [name], female    DOB: [birth year], 66 y.o.   MRN: [id number]  HPI     This patient presents today complaining of chronic right ankle pain after accidental injury occurring in August 2017 when she jumped out of a [employer]. She complains of sharp pain and a persistent swelling in the right ankle aggravated with weightbearing and relieved somewhat with rest and elevation. Patient has been using compression garments and elevating the right extremity. Patient describes visiting physician in September 2017 with x-ray examination. Patient was advised to continue local care including wrapping the foot and ankle. The symptoms did not improve and patient returned again for follow-up visit and patient was encouraged to maintain local therapy and wrapping. She complains of intense pain in the anterior right ankle with weightbearing which limits her standing and walking causes to walk with a limp. The pain in the right ankle has increased since the injury. She denies any other active joint pain at this time  Patient is diabetic and denies history of foot ulceration, claudication or amputation Patient denies smoking history Patient has a history of neuropathy which he says has resolved and she no longer takes medication Patient's daughter present to treatment room today  Review of Systems  HENT: Positive for sinus pain and sinus pressure.   Musculoskeletal: Positive for back pain and joint swelling.  All other systems reviewed and are negative.      Objective:   Physical Exam  Orientated 3  Vascular: Bilateral peripheral nonpitting edema DP pulses 2/4 bilaterally PT pulses 2/4 bilaterally Capillary reflex immediate bilaterally  Neurological: Sensation to 10 g monofilament wire intact 5/5 bilaterally Vibratory sensation reactive bilaterally Ankle reflexes reactive bilaterally  Dermatological: No open skin lesions bilaterally Absent hair growth  bilaterally Texture and turgor within normal limits bilaterally  Musculoskeletal: Dorsi flexion and plantar flexion right ankle without crepitus with pain on range of motion No tenderness or crepitus on range of motion of left ankle Palpable tenderness anterior right ankle without any palpable lesions No palpable tenderness in the extensor tendons right ankle Manual motor testing dorsi flexion, plantar flexion, inversion, eversion 5/5 bilaterally  Review of right ankle x-rays dated 06/24/2016  Soft tissue swelling that is greatest laterally. No acute fracture or malalignment. No degenerative ankle narrowing. Heel spurs  Impression: Soft tissue swelling without fracture  X-ray examination weightbearing right ankle dated 02/23/2017  Intact bony structure without fracture and/or dislocation Ankle mortise within normal limits Increased soft tissue density noted throughout all views Calcaneal spurs noted   Radiographic impression: No acute bony abnormality noted weightbearing x-ray of the right ankle dated 02/23/2017 No significant change from baseline x-ray of 06/24/2016       Assessment & Plan:   Assessment: Diabetic with satisfactory neurovascular status Chronic right ankle pain without definitive diagnosis  Plan: Today I reviewed the results examine x-ray with patient and patient's daughter. I informed patient's daughter that I could not give her definitive diagnosis as to the cause of the right ankle pain. At this time I'm referring patient to either Dr. Amalia Hailey or Dr. Earleen Newport for further evaluation and treatment is patient and patient consulting Dr. agreed-upon  Arrange for consult/evaluation as described above

## 2017-02-23 NOTE — Patient Instructions (Signed)
Today your diabetic foot screen demonstrated adequate circulation and feeling in your feet The ankle x-ray taken today demonstrated no fractures or dislocations. Increased swelling was noted in around the ankle area. I am referring you to either Dr. Amalia Hailey or Dr. Earleen Newport for further evaluation of your ongoing right ankle pain   Diabetes and Foot Care Diabetes may cause you to have problems because of poor blood supply (circulation) to your feet and legs. This may cause the skin on your feet to become thinner, break easier, and heal more slowly. Your skin may become dry, and the skin may peel and crack. You may also have nerve damage in your legs and feet causing decreased feeling in them. You may not notice minor injuries to your feet that could lead to infections or more serious problems. Taking care of your feet is one of the most important things you can do for yourself. Follow these instructions at home:  Wear shoes at all times, even in the house. Do not go barefoot. Bare feet are easily injured.  Check your feet daily for blisters, cuts, and redness. If you cannot see the bottom of your feet, use a mirror or ask someone for help.  Wash your feet with warm water (do not use hot water) and mild soap. Then pat your feet and the areas between your toes until they are completely dry. Do not soak your feet as this can dry your skin.  Apply a moisturizing lotion or petroleum jelly (that does not contain alcohol and is unscented) to the skin on your feet and to dry, brittle toenails. Do not apply lotion between your toes.  Trim your toenails straight across. Do not dig under them or around the cuticle. File the edges of your nails with an emery board or nail file.  Do not cut corns or calluses or try to remove them with medicine.  Wear clean socks or stockings every day. Make sure they are not too tight. Do not wear knee-high stockings since they may decrease blood flow to your legs.  Wear shoes  that fit properly and have enough cushioning. To break in new shoes, wear them for just a few hours a day. This prevents you from injuring your feet. Always look in your shoes before you put them on to be sure there are no objects inside.  Do not cross your legs. This may decrease the blood flow to your feet.  If you find a minor scrape, cut, or break in the skin on your feet, keep it and the skin around it clean and dry. These areas may be cleansed with mild soap and water. Do not cleanse the area with peroxide, alcohol, or iodine.  When you remove an adhesive bandage, be sure not to damage the skin around it.  If you have a wound, look at it several times a day to make sure it is healing.  Do not use heating pads or hot water bottles. They may burn your skin. If you have lost feeling in your feet or legs, you may not know it is happening until it is too late.  Make sure your health care provider performs a complete foot exam at least annually or more often if you have foot problems. Report any cuts, sores, or bruises to your health care provider immediately. Contact a health care provider if:  You have an injury that is not healing.  You have cuts or breaks in the skin.  You have an ingrown  nail.  You notice redness on your legs or feet.  You feel burning or tingling in your legs or feet.  You have pain or cramps in your legs and feet.  Your legs or feet are numb.  Your feet always feel cold. Get help right away if:  There is increasing redness, swelling, or pain in or around a wound.  There is a red line that goes up your leg.  Pus is coming from a wound.  You develop a fever or as directed by your health care provider.  You notice a bad smell coming from an ulcer or wound. This information is not intended to replace advice given to you by your health care provider. Make sure you discuss any questions you have with your health care provider. Document Released: 09/17/2000  Document Revised: 02/26/2016 Document Reviewed: 02/27/2013 Elsevier Interactive Patient Education  2017 [employer].

## 2017-02-24 ENCOUNTER — Ambulatory Visit: Payer: Medicare Other | Admitting: Internal Medicine

## 2017-02-25 ENCOUNTER — Ambulatory Visit (AMBULATORY_SURGERY_CENTER): Payer: Self-pay | Admitting: *Deleted

## 2017-02-25 VITALS — Ht 61.0 in | Wt 213.0 lb

## 2017-02-25 DIAGNOSIS — Z86010 Personal history of colonic polyps: Secondary | ICD-10-CM

## 2017-02-25 MED ORDER — NA SULFATE-K SULFATE-MG SULF 17.5-3.13-1.6 GM/177ML PO SOLN: 1.0000 | Freq: Once | ORAL | 0 refills | Status: AC

## 2017-02-25 NOTE — Progress Notes (Signed)
No egg or soy allergy known to patient  No issues with past sedation with any surgeries  or procedures, no intubation problems  No diet pills per patient No home 02 use per patient  No blood thinners per patient  Pt denies issues with constipation  No A fib or A flutter  EMMI video sent to pt's e mail  

## 2017-03-01 ENCOUNTER — Ambulatory Visit (INDEPENDENT_AMBULATORY_CARE_PROVIDER_SITE_OTHER): Payer: Medicare Other | Admitting: Internal Medicine

## 2017-03-01 VITALS — BP 125/57 | HR 80 | Temp 98.0°F | Ht 61.0 in | Wt 215.5 lb

## 2017-03-01 DIAGNOSIS — E1140 Type 2 diabetes mellitus with diabetic neuropathy, unspecified: Secondary | ICD-10-CM | POA: Diagnosis not present

## 2017-03-01 DIAGNOSIS — Z794 Long term (current) use of insulin: Secondary | ICD-10-CM

## 2017-03-01 LAB — POCT GLYCOSYLATED HEMOGLOBIN (HGB A1C): Hemoglobin A1C: 8.6

## 2017-03-01 LAB — GLUCOSE, CAPILLARY: Glucose-Capillary: 124 mg/dL — ABNORMAL HIGH (ref 65–99)

## 2017-03-01 MED ORDER — INSULIN DETEMIR 100 UNIT/ML FLEXPEN: 30.0000 [IU] | PEN_INJECTOR | Freq: Every day | SUBCUTANEOUS | 3 refills | Status: DC

## 2017-03-01 NOTE — Assessment & Plan Note (Addendum)
Patient is here today for diabetes follow-up. She was last seen on 01/31/2017 at which point her 70/30 insulin was discontinued due to episodes of hypoglycemia. She was changed to Levemir 25 units QHS and Victoza 0.6 once a day. Her metformin 1000 mg BID was continued. She was instructed to follow up in 1 week unfortunately was unable to make the appointment due to transportation issues. She has her glucometer with her today, and CBGs have been elevated in the 200s with a range of 377 - 101. She is complaining of nausea and upset stomach with her new regimen and attributes this to the Victoza. She also reports multiple episode a day where she "blacks out" and temporarily loses vision for a few seconds. She denies LOC during these episodes, but reports she feels "woozy" like she might pass out. The episodes last for just a few seconds at a time before she returns to baseline. She denies that the episodes are worse with positional changes and reports good PO intake despite her nausea. Orthostatic vitals today in clinic were negative. Overall, her DM remains poorly controlled on her new regimen and she is experiencing side effects of nausea and upset stomach as well as these "blackout" episodes on victoza. We will discontinue victoza for now and increase her Levemir to 30 units QHS. Patient is agreeable to this plan. Instructed patient to follow up in 2 weeks with her glucometer.  -- A1c today -- Stop Victoza  -- Increase levemir to 30 units QHS -- Continue metformin 1000 mg BID -- Follow up 2 weeks with glucometer   ADDENDUM: A1c has increased 7.3 -> 8.6. Continue above regimen. Follow up 2 weeks.

## 2017-03-01 NOTE — Progress Notes (Signed)
   CC: Diabetes follow up   HPI:  [name] is a 66 y.o. female with past medical history outlined below here for diabetes follow up. For the details of today's visit, please refer to the assessment and plan.  Past Medical History:  Diagnosis Date  . Allergy   . Arthritis    Knees  . 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 S/p lumpectomy and radiation.  Marland Kitchen CAD (coronary artery disease) 11/07   cath 11/11 :  3V CADsee report   . Cataract    small both eyes  . Chronic pain    MR 08/07/10 :  Spondylosis L4-5 with B foraminal narrowing and L4 nerve root enchroachment B.  . Chronic venous insufficiency 2012   Has had full W/U incl ECHO, LFT's, Cr, Umicro, TSH, BNP. Symptomatic treatment.  . Colonic polyp 1995   Colonoscopies 1994, 2000, and 02/02/2011. Last had 9 polyps - Tubular adenoma and tubulovillous was the pathology results without high grade dysplasia  . Diabetes mellitus type II    Insulin dependent. Has worked with [name] R previously.  . Diastolic CHF, chronic ([place])    NL EF by echo 01/2012, Gr I dd  . Essential hypertension    Requires 5 drug therapy and still difficult to control.  Marland Kitchen GERD (gastroesophageal reflux disease)   . Hyperlipidemia    On daily statin  . Iron deficiency anemia    Ferritin was 12 in 2012. on FeSo4. Has had colon and EGD 02/02/11 that showed mild gastritis and colon polyps.  . Obesity    Morbid. HAs worked with [name] T previously.  . OSA (obstructive sleep apnea) 02/16/2007   02/2007 : Moderate AHI 35.6, O2 sat decreased to 65%, CPAP titration to 16 with AHI of 3.6. 02/2014 : Rare respiratory events with sleep disturbance, within normal limits. AHI 0.4 per hour      . Seasonal allergies    seasonal  . Sleep apnea    no cpap     Review of Systems:  Denies chest pain and SOB. No fevers.   Physical Exam:  Vitals:   03/01/17 1544  BP: (!) 125/57  Pulse: 80  Temp: 98 F (36.7 C)    TempSrc: Oral  SpO2: 100%  Weight: 215 lb 8 oz (97.8 kg)  Height: 5\' 1"  (1.549 m)    Constitutional: NAD, appears comfortable  Cardiovascular: RRR, no murmurs, rubs, or gallops.  Pulmonary/Chest: CTAB Abdominal: Soft, non tender, non distended. +BS.  Extremities: Warm and well perfused. Compression stockings in place, bilateral non pitting edema  Neurological: A&Ox3, CN II - XII grossly intact.  Skin: No rashes or erythema  Psychiatric: Normal mood and affect  Assessment & Plan:   See Encounters Tab for problem based charting.  Patient discussed with Dr. Evette Doffing

## 2017-03-01 NOTE — Patient Instructions (Signed)
Ms. Dobesh,  Please stop taking your Victoza. I have increased your Levemir. Please inject 30 units in the evenings prior to bed. Please continue to check your blood sugar daily. Please follow up in 2 weeks with your glucometer. I will call you with the results of your blood work today. If you have any questions or concerns, call our clinic at [phone] or after hours call [phone] and ask for the internal medicine resident on call. Thank you!  - Dr. Philipp Ovens

## 2017-03-01 NOTE — Progress Notes (Signed)
Note created in error.

## 2017-03-02 ENCOUNTER — Ambulatory Visit: Payer: Medicare Other

## 2017-03-02 NOTE — Progress Notes (Signed)
Internal Medicine Clinic Attending  Case discussed with Dr. Guilloud at the time of the visit.  We reviewed the resident's history and exam and pertinent patient test results.  I agree with the assessment, diagnosis, and plan of care documented in the resident's note.  

## 2017-03-11 ENCOUNTER — Ambulatory Visit (AMBULATORY_SURGERY_CENTER): Payer: Medicare Other | Admitting: Gastroenterology

## 2017-03-11 ENCOUNTER — Encounter: Payer: Self-pay | Admitting: Gastroenterology

## 2017-03-11 VITALS — BP 138/66 | HR 74 | Temp 96.4°F | Resp 13 | Ht 61.0 in | Wt 213.0 lb

## 2017-03-11 DIAGNOSIS — Z86010 Personal history of colonic polyps: Secondary | ICD-10-CM

## 2017-03-11 DIAGNOSIS — D124 Benign neoplasm of descending colon: Secondary | ICD-10-CM

## 2017-03-11 DIAGNOSIS — E119 Type 2 diabetes mellitus without complications: Secondary | ICD-10-CM | POA: Diagnosis not present

## 2017-03-11 DIAGNOSIS — G4733 Obstructive sleep apnea (adult) (pediatric): Secondary | ICD-10-CM | POA: Diagnosis not present

## 2017-03-11 MED ORDER — SODIUM CHLORIDE 0.9 % IV SOLN: 500.0000 mL | INTRAVENOUS | Status: DC

## 2017-03-11 NOTE — Op Note (Signed)
[facility] Patient Name: [name] Procedure Date: 03/11/2017 9:22 AM MRN: [id number] Endoscopist: Milus Banister , MD Age: 66 Referring MD:  Date of Birth: [birth year] Gender: Female Account #: [id number] Procedure:                Colonoscopy Indications:              High risk colon cancer surveillance: Personal                            history of colonic polyps: multiple adenomas                            removed 2012 (8-9 polyps) Dr. Ardis Hughs; repeat                            colonoscopy 2013 two small adenomas removed Medicines:                Monitored Anesthesia Care Procedure:                Pre-Anesthesia Assessment:                           - Prior to the procedure, a History and Physical                            was performed, and patient medications and                            allergies were reviewed. The patient's tolerance of                            previous anesthesia was also reviewed. The risks                            and benefits of the procedure and the sedation                            options and risks were discussed with the patient.                            All questions were answered, and informed consent                            was obtained. Prior Anticoagulants: The patient has                            taken no previous anticoagulant or antiplatelet                            agents. ASA Grade Assessment: III - A patient with                            severe systemic disease. After reviewing the risks  and benefits, the patient was deemed in                            satisfactory condition to undergo the procedure.                           After obtaining informed consent, the colonoscope                            was passed under direct vision. Throughout the                            procedure, the patient's blood pressure, pulse, and                            oxygen saturations were  monitored continuously. The                            Colonoscope was introduced through the anus and                            advanced to the the cecum, identified by                            appendiceal orifice and ileocecal valve. The                            colonoscopy was performed without difficulty. The                            patient tolerated the procedure well. The quality                            of the bowel preparation was excellent. The                            ileocecal valve, appendiceal orifice, and rectum                            were photographed. Scope In: 9:24:19 AM Scope Out: 9:37:02 AM Scope Withdrawal Time: 0 hours 10 minutes 38 seconds  Total Procedure Duration: 0 hours 12 minutes 43 seconds  Findings:                 Three sessile polyps were found in the descending                            colon. The polyps were 3 to 4 mm in size. These                            polyps were removed with a cold snare. Resection                            and retrieval were complete.  The exam was otherwise without abnormality on                            direct and retroflexion views. Complications:            No immediate complications. Estimated blood loss:                            None. Estimated Blood Loss:     Estimated blood loss: none. Impression:               - Three 3 to 4 mm polyps in the descending colon,                            removed with a cold snare. Resected and retrieved.                           - The examination was otherwise normal on direct                            and retroflexion views. Recommendation:           - Patient has a contact number available for                            emergencies. The signs and symptoms of potential                            delayed complications were discussed with the                            patient. Return to normal activities tomorrow.                             Written discharge instructions were provided to the                            patient.                           - Resume previous diet.                           - Continue present medications.                           You will receive a letter within 2-3 weeks with the                            pathology results and my final recommendations.                           If the polyp(s) is proven to be 'pre-cancerous' on                            pathology, you will need repeat colonoscopy in  3-5                            years. Milus Banister, MD 03/11/2017 9:42:53 AM This report has been signed electronically.

## 2017-03-11 NOTE — Patient Instructions (Signed)
YOU HAD AN ENDOSCOPIC PROCEDURE TODAY AT THE [facility]:   Refer to the procedure report that was given to you for any specific questions about what was found during the examination.  If the procedure report does not answer your questions, please call your gastroenterologist to clarify.  If you requested that your care partner not be given the details of your procedure findings, then the procedure report has been included in a sealed envelope for you to review at your convenience later.  YOU SHOULD EXPECT: Some feelings of bloating in the abdomen. Passage of more gas than usual.  Walking can help get rid of the air that was put into your GI tract during the procedure and reduce the bloating. If you had a lower endoscopy (such as a colonoscopy or flexible sigmoidoscopy) you may notice spotting of blood in your stool or on the toilet paper. If you underwent a bowel prep for your procedure, you may not have a normal bowel movement for a few days.  Please Note:  You might notice some irritation and congestion in your nose or some drainage.  This is from the oxygen used during your procedure.  There is no need for concern and it should clear up in a day or so.  SYMPTOMS TO REPORT IMMEDIATELY:   Following lower endoscopy (colonoscopy or flexible sigmoidoscopy):  Excessive amounts of blood in the stool  Significant tenderness or worsening of abdominal pains  Swelling of the abdomen that is new, acute  Fever of 100F or higher  For urgent or emergent issues, a gastroenterologist can be reached at any hour by calling [phone].   DIET:  We do recommend a small meal at first, but then you may proceed to your regular diet.  Drink plenty of fluids but you should avoid alcoholic beverages for 24 hours.  ACTIVITY:  You should plan to take it easy for the rest of today and you should NOT DRIVE or use heavy machinery until tomorrow (because of the sedation medicines used during the test).     FOLLOW UP: Our staff will call the number listed on your records the next business day following your procedure to check on you and address any questions or concerns that you may have regarding the information given to you following your procedure. If we do not reach you, we will leave a message.  However, if you are feeling well and you are not experiencing any problems, there is no need to return our call.  We will assume that you have returned to your regular daily activities without incident.  If any biopsies were taken you will be contacted by phone or by letter within the next 1-3 weeks.  Please call us at [phone] if you have not heard about the biopsies in 3 weeks.   Await for biopsy results to determine next repeat Colonoscopy screening Polyps (handout given)  SIGNATURES/CONFIDENTIALITY: You and/or your care partner have signed paperwork which will be entered into your electronic medical record.  These signatures attest to the fact that that the information above on your After Visit Summary has been reviewed and is understood.  Full responsibility of the confidentiality of this discharge information lies with you and/or your care-partner. 

## 2017-03-11 NOTE — Progress Notes (Signed)
Called to room to assist during endoscopic procedure.  Patient ID and intended procedure confirmed with present staff. Received instructions for my participation in the procedure from the performing physician.  

## 2017-03-11 NOTE — Progress Notes (Signed)
Pt's states no medical or surgical changes since previsit or office visit. 

## 2017-03-11 NOTE — Progress Notes (Signed)
Report to PACU, RN, vss, BBS= Clear.  

## 2017-03-14 ENCOUNTER — Telehealth: Payer: Self-pay

## 2017-03-14 ENCOUNTER — Encounter: Payer: Self-pay | Admitting: Podiatry

## 2017-03-14 ENCOUNTER — Ambulatory Visit (INDEPENDENT_AMBULATORY_CARE_PROVIDER_SITE_OTHER): Payer: Medicare Other | Admitting: Podiatry

## 2017-03-14 DIAGNOSIS — Z0000 Encounter for general adult medical examination without abnormal findings: Secondary | ICD-10-CM

## 2017-03-14 DIAGNOSIS — M7751 Other enthesopathy of right foot: Secondary | ICD-10-CM | POA: Diagnosis not present

## 2017-03-14 DIAGNOSIS — M25571 Pain in right ankle and joints of right foot: Secondary | ICD-10-CM

## 2017-03-14 DIAGNOSIS — M659 Synovitis and tenosynovitis, unspecified: Secondary | ICD-10-CM | POA: Diagnosis not present

## 2017-03-14 NOTE — Telephone Encounter (Signed)
  Follow up Call-  Call back number 03/11/2017  Post procedure Call Back phone  # [phone]  Some recent data might be hidden     Patient questions:  Do you have a fever, pain , or abdominal swelling? No. Pain Score  0 *  Have you tolerated food without any problems? Yes.    Have you been able to return to your normal activities? Yes.    Do you have any questions about your discharge instructions: Diet   No. Medications  No. Follow up visit  No.  Do you have questions or concerns about your Care? No.  Actions: * If pain score is 4 or above: No action needed, pain <4.

## 2017-03-15 ENCOUNTER — Encounter: Payer: Self-pay | Admitting: Gastroenterology

## 2017-03-23 NOTE — Progress Notes (Signed)
   Subjective:  Patient presents today for follow up evaluation of pain and tenderness to the right ankle. She reports associated swelling to the medial side of the right ankle. She states her symptoms are unchanged since her last evaluation one month ago. Patient relates significant pain and tenderness when walking. Patient presents for further treatment and evaluation.    Objective / Physical Exam:  General:  The patient is alert and oriented x3 in no acute distress. Dermatology:  Skin is warm, dry and supple bilateral lower extremities. Negative for open lesions or macerations. Vascular:  Palpable pedal pulses bilaterally. No edema or erythema noted. Capillary refill within normal limits. Neurological:  Epicritic and protective threshold grossly intact bilaterally.  Musculoskeletal Exam:  Pain on palpation to the anterior lateral medial aspects of the patient's right ankle. Mild edema noted.  Range of motion within normal limits to all pedal and ankle joints bilateral. Muscle strength 5/5 in all groups bilateral.   Radiographic Exam:  Normal osseous mineralization. Joint spaces preserved. No fracture/dislocation/boney destruction.    Assessment: #1 pain in right ankle #2 synovitis of right ankle-medial  Plan of Care:  #1 Patient was evaluated. X-Rays reviewed. #2 injection of 0.5 mL Celestone Soluspan injected in the patient's right ankle. #3 continue wearing compression stockings. #4 Return to clinic in 4 weeks.  Edrick Kins, DPM [facility]  Dr. Edrick Kins, [address]                                        [place], [place] [address]                Office [phone]  Fax [phone]

## 2017-03-24 ENCOUNTER — Ambulatory Visit (INDEPENDENT_AMBULATORY_CARE_PROVIDER_SITE_OTHER): Payer: Medicare Other | Admitting: Dietician

## 2017-03-24 ENCOUNTER — Ambulatory Visit (INDEPENDENT_AMBULATORY_CARE_PROVIDER_SITE_OTHER): Payer: Medicare Other | Admitting: Internal Medicine

## 2017-03-24 DIAGNOSIS — Z6841 Body Mass Index (BMI) 40.0 and over, adult: Secondary | ICD-10-CM | POA: Diagnosis not present

## 2017-03-24 DIAGNOSIS — Z7289 Other problems related to lifestyle: Secondary | ICD-10-CM

## 2017-03-24 DIAGNOSIS — Z79899 Other long term (current) drug therapy: Secondary | ICD-10-CM | POA: Diagnosis not present

## 2017-03-24 DIAGNOSIS — E1140 Type 2 diabetes mellitus with diabetic neuropathy, unspecified: Secondary | ICD-10-CM

## 2017-03-24 DIAGNOSIS — E119 Type 2 diabetes mellitus without complications: Secondary | ICD-10-CM

## 2017-03-24 DIAGNOSIS — Z713 Dietary counseling and surveillance: Secondary | ICD-10-CM | POA: Diagnosis not present

## 2017-03-24 DIAGNOSIS — D508 Other iron deficiency anemias: Secondary | ICD-10-CM

## 2017-03-24 DIAGNOSIS — Z794 Long term (current) use of insulin: Secondary | ICD-10-CM

## 2017-03-24 DIAGNOSIS — Z1159 Encounter for screening for other viral diseases: Secondary | ICD-10-CM | POA: Diagnosis not present

## 2017-03-24 DIAGNOSIS — Z114 Encounter for screening for human immunodeficiency virus [HIV]: Secondary | ICD-10-CM

## 2017-03-24 DIAGNOSIS — D509 Iron deficiency anemia, unspecified: Secondary | ICD-10-CM

## 2017-03-24 DIAGNOSIS — I10 Essential (primary) hypertension: Secondary | ICD-10-CM | POA: Diagnosis not present

## 2017-03-24 DIAGNOSIS — R6889 Other general symptoms and signs: Secondary | ICD-10-CM | POA: Diagnosis not present

## 2017-03-24 DIAGNOSIS — D649 Anemia, unspecified: Secondary | ICD-10-CM | POA: Diagnosis not present

## 2017-03-24 DIAGNOSIS — G4733 Obstructive sleep apnea (adult) (pediatric): Secondary | ICD-10-CM

## 2017-03-24 NOTE — Patient Instructions (Signed)
1. Discuss diabetes with [name] and let me know decision

## 2017-03-24 NOTE — Progress Notes (Signed)
Medical Nutrition Therapy:  Appt start time: 0454 end time:  1120. Last visit 12/2016  Assessment: Primary concerns today: healthy meal plan, glycemic control Patient attended Group Medical Nutrition Therapy visit today. Education included successes, challenges, goals,  participation in a [place] discussion about continuing the journey with diabetes.    Spent 30 minutes discussing options for other medicine per Dr. Lynnae January to replace her levemir which she does not like. She does not want any more pills or meal time insulin or to try the VGo  Her goal is UJ:[id number] to stay on target by eating healthy, increase water , exercise Follow up of previous goal  5- "did it always"  SELF Monitoring her meter download, shows fasting in target and rising gradually throughout the day.  Labs: most recent A1C is 8.6% DIETARY INTAKE: Not assessed in detail Weight- weighs at home, last weigh increased to 221# with BMI~ 42  Progress Towards Goal(s):  No progress.   Nutritional Diagnosis:    2.2 altered nutrition related laboratory values of A1C of >7% as related to higher blood sugar remains about the same as evidenced her A1C of 8.6%.  Intervention: Group Nutrition counseling and education about  Continuing the journey with diabetes using the conversation map Barriers to learning/adherence to lifestyle change: competing values, lack of support Demonstrated degree of understanding via: Teach Back   Monitoring/Evaluation: Dietary intake, exercise, meter and body weight in 4 weeks for group(s)  Abbeygail Igoe, [name], RD 03/24/2017 4:09 PM.

## 2017-03-25 ENCOUNTER — Encounter: Payer: Self-pay | Admitting: Internal Medicine

## 2017-03-25 ENCOUNTER — Other Ambulatory Visit: Payer: Self-pay | Admitting: Cardiology

## 2017-03-25 LAB — MICROALBUMIN / CREATININE URINE RATIO
Creatinine, Urine: 331.6 mg/dL
MICROALB/CREAT RATIO: 16.2 mg/g{creat} (ref 0.0–30.0)
Microalbumin, Urine: 53.7 ug/mL

## 2017-03-25 LAB — VITAMIN B12: Vitamin B-12: 1074 pg/mL (ref 232–1245)

## 2017-03-25 LAB — HIV ANTIBODY (ROUTINE TESTING W REFLEX): HIV Screen 4th Generation wRfx: NONREACTIVE

## 2017-03-25 LAB — HEPATITIS C ANTIBODY: Hep C Virus Ab: <0.1 s/co ratio (ref 0.0–0.9)

## 2017-03-25 MED ORDER — INSULIN GLARGINE 300 UNIT/ML ~~LOC~~ SOPN: 28.0000 [IU] | PEN_INJECTOR | Freq: Every day | SUBCUTANEOUS | 1 refills | Status: DC

## 2017-03-25 NOTE — Progress Notes (Signed)
   Subjective:    Patient ID: [name], female    DOB: 1951/09/20, 66 y.o.   MRN: [id number]  HPI  [name] is here for DM F/U. Please see the A&P for the status of the pt's chronic medical problems.  ROS : per ROS section and in problem oriented charting. All other systems are negative.  PMHx, Soc hx, and / or Fam hx : She continues to work with [name], her diabetes educator. She tries to work on a treadmill for exercise bicycle and is able to exercise for 20 minutes without stopping.  Review of Systems  Eyes: Positive for visual disturbance.  Cardiovascular:       Denies claudication symptoms in her calves bilaterally  Neurological: Negative for dizziness and light-headedness.       Objective:   Physical Exam  Constitutional: She appears well-developed and well-nourished. No distress.  HENT:  Head: Normocephalic and atraumatic.  Right Ear: External ear normal.  Left Ear: External ear normal.  Nose: Nose normal.  Eyes: Conjunctivae and EOM are normal. Right eye exhibits no discharge. Left eye exhibits no discharge. No scleral icterus.  Cardiovascular: Normal rate, regular rhythm and normal heart sounds.   Pulmonary/Chest: Effort normal and breath sounds normal. No respiratory distress.  Musculoskeletal: She exhibits edema. She exhibits no tenderness.  Neurological: She is alert.  Skin: Skin is warm. She is not diaphoretic.  Psychiatric: She has a normal mood and affect. Her behavior is normal. Judgment and thought content normal.          Assessment & Plan:

## 2017-03-25 NOTE — Assessment & Plan Note (Signed)
This problem is chronic and stable. Her blood pressure is at goal today. Her regimen is Norvasc 10, Toprol 100, losartan 50, indoor 60, and Lasix 20 as needed. She has no side effects to these medications.  PLAN:  Cont current meds   BP Readings from Last 3 Encounters:  03/11/17 138/66  03/01/17 (!) 125/57  02/23/17 140/74

## 2017-03-25 NOTE — Assessment & Plan Note (Signed)
This problem is chronic and stable. She continues to take oral iron and B12. Her hemoglobin was 11.5 in February of this year. Her ferritin was 73 in February of this year. I wanted to check a B12 level as her RDW was elevated. B12 was checked today and it was over 1000.  PLAN:  Cont current meds

## 2017-03-25 NOTE — Assessment & Plan Note (Signed)
This problem is chronic and uncontrolled. She is on metformin 1000 twice a day. At her last appointment, I changed her from 7030 insulin because she was getting hypoglycemic. She was started on Levemir 25 and Victoza. She then return to the clinic complaining of side effects due to the Victoza. It was stopped and her Levemir was increased to 30. She complains of hyperglycemia which is symptomatic. She requests to return to 7030 insulin despite hypoglycemia. I discussed other options and encourage that she keep her appointment with [name] our diabetes educator to make a final decision. The decision was to change to Toujo 28 units in the morning and to change her injection site weekly.   PLAN : Continue metformin 1000 twice a day Stop Levemir Toujo 28 units in the morning

## 2017-03-25 NOTE — Assessment & Plan Note (Signed)
This problem is chronic and uncontrolled. We reviewed her sleep results which showed mild sleep apnea. She has not yet gotten her CPAP machine so I will order that for her today. She did not have any trouble sleeping the machine and mask during her sleep study and thinks that she will use it at home.  PLAN : Start CPAP

## 2017-03-28 MED ORDER — BETAMETHASONE SOD PHOS & ACET 6 (3-3) MG/ML IJ SUSP: 3.0000 mg | Freq: Once | INTRAMUSCULAR | Status: DC

## 2017-03-31 ENCOUNTER — Ambulatory Visit: Payer: Medicare Other | Admitting: Internal Medicine

## 2017-03-31 ENCOUNTER — Ambulatory Visit: Payer: Medicare Other | Admitting: Dietician

## 2017-03-31 DIAGNOSIS — Z794 Long term (current) use of insulin: Principal | ICD-10-CM

## 2017-03-31 DIAGNOSIS — E1140 Type 2 diabetes mellitus with diabetic neuropathy, unspecified: Secondary | ICD-10-CM

## 2017-03-31 NOTE — Progress Notes (Signed)
I saw [name] last week on the 21st. Ulis Rias did inform me that she needed her diabetic foot form is filled out. We got distracted helping without her diabetic regimen and I did not complete the form. She returned today and Investment banker, operational, our diabetes educator, completed her foot exam. She had no other healthcare needs today.

## 2017-03-31 NOTE — Progress Notes (Signed)
Patient sees podiatrist. She trims her own nails and checks her feet daily. Foot exam done. See Diabetic Foot Exam detailed for results.  [name], [name], [name] 03/31/2017 1:41 PM.

## 2017-04-11 ENCOUNTER — Encounter: Payer: Self-pay | Admitting: Podiatry

## 2017-04-11 ENCOUNTER — Ambulatory Visit (INDEPENDENT_AMBULATORY_CARE_PROVIDER_SITE_OTHER): Payer: Medicare Other | Admitting: Podiatry

## 2017-04-11 DIAGNOSIS — M76821 Posterior tibial tendinitis, right leg: Secondary | ICD-10-CM

## 2017-04-13 NOTE — Progress Notes (Signed)
[facility] has called the patient on 3 occasions to set up her CPAP education and patient has not responded.  [facility] per Dimas Chyle sent patient a letter today to contact their office to set up.

## 2017-04-21 ENCOUNTER — Ambulatory Visit: Payer: Medicare Other | Admitting: Pharmacist

## 2017-04-29 MED ORDER — BETAMETHASONE SOD PHOS & ACET 6 (3-3) MG/ML IJ SUSP: 3.0000 mg | Freq: Once | INTRAMUSCULAR | Status: DC

## 2017-04-29 NOTE — Progress Notes (Signed)
   HPI: Patient presents today for follow-up treatment and evaluation of right ankle pain. Patient states that the injection helped for approximately 2-3 weeks. The pain slowly came back. Patient presents today for further treatment and evaluation     Physical Exam: General: The patient is alert and oriented x3 in no acute distress.  Dermatology: Skin is warm, dry and supple bilateral lower extremities. Negative for open lesions or macerations.  Vascular: Palpable pedal pulses bilaterally. No edema or erythema noted. Capillary refill within normal limits.  Neurological: Epicritic and protective threshold grossly intact bilaterally.   Musculoskeletal Exam: There is no longer is any pain to palpation to the medial aspect of the patient's right ankle joint as were the clinical findings on last visit. The pain appears to have been resolved. There is a new pain on palpation to the posterior malleolus are area of the right ankle suggestive of a posterior tibial tendinitis   Assessment: 1. Posterior tibial tendinitis right   Plan of Care:  1. Patient was evaluated. 2. Injection of 0.5 mL Celestone Soluspan injected in the right posterior tibial tendon sheath. Care was taken to avoid direct injection into the tendon 3. Today an ankle brace was dispensed to support the right posterior tibial tendon and ankle structures 4. Return to clinic in 4 weeks   Edrick Kins, DPM [facility]  Dr. Edrick Kins, DPM    [address], [place] [address]                Office [phone]  Fax [phone]

## 2017-05-06 ENCOUNTER — Telehealth: Payer: Self-pay | Admitting: Dietician

## 2017-05-06 NOTE — Telephone Encounter (Signed)
Calling to follow up on new insulin regimen

## 2017-05-09 ENCOUNTER — Other Ambulatory Visit: Payer: Self-pay | Admitting: Internal Medicine

## 2017-05-09 DIAGNOSIS — Z1231 Encounter for screening mammogram for malignant neoplasm of breast: Secondary | ICD-10-CM

## 2017-05-10 NOTE — Telephone Encounter (Signed)
Patient returns call to report blood sugars since on new insulin:  Lowest "145, 169-167, more high than low".  July 163/145/279/170/212/290/207/327/140/142/268/239/128/212/253/172/ August  192/170/146 212/189/195 191/163/200 187/207 Feels like her blood sugars are "Like a yo-yo", eating more vegetables, less fruit. suggest increase of 10%-2-3, would be happy if they are back to "normal".   Her blood pressure has been 120/80, 115/55   Agreed to increase 31 units a day toujeo until she sees Dr. Lynnae January. Agrees to target 90 - 140 before meals that she has not snacked in between.

## 2017-05-16 ENCOUNTER — Ambulatory Visit: Payer: Medicare Other | Admitting: Podiatry

## 2017-05-19 ENCOUNTER — Ambulatory Visit (INDEPENDENT_AMBULATORY_CARE_PROVIDER_SITE_OTHER): Payer: Medicare Other | Admitting: Dietician

## 2017-05-19 DIAGNOSIS — E1140 Type 2 diabetes mellitus with diabetic neuropathy, unspecified: Secondary | ICD-10-CM

## 2017-05-19 DIAGNOSIS — Z794 Long term (current) use of insulin: Secondary | ICD-10-CM | POA: Diagnosis not present

## 2017-05-19 DIAGNOSIS — Z713 Dietary counseling and surveillance: Secondary | ICD-10-CM

## 2017-05-20 ENCOUNTER — Encounter: Payer: Self-pay | Admitting: Dietician

## 2017-05-20 ENCOUNTER — Telehealth: Payer: Self-pay | Admitting: Dietician

## 2017-05-20 DIAGNOSIS — Z794 Long term (current) use of insulin: Principal | ICD-10-CM

## 2017-05-20 DIAGNOSIS — E1140 Type 2 diabetes mellitus with diabetic neuropathy, unspecified: Secondary | ICD-10-CM

## 2017-05-20 MED ORDER — INSULIN GLARGINE 300 UNIT/ML ~~LOC~~ SOPN: 35.0000 [IU] | PEN_INJECTOR | Freq: Every day | SUBCUTANEOUS | 1 refills | Status: DC

## 2017-05-20 NOTE — Telephone Encounter (Signed)
done

## 2017-05-20 NOTE — Progress Notes (Signed)
Medical Nutrition Therapy:  Appt start time: 5868 end time:  1120.  Assessment: Primary concerns today: healthy meal plan, glycemic control. [name] attended group diabetes and weight management medical nutrition therapy class today.  We had a food demo, discussed success, challenges, goals, Monitoring your blood sugars using the diabetes conversation map  Her goal :continue to stay on target by eating healthy, increase water intake, exercise as much as possible Follow up of previous goal  5- "did it always"  Blood sugar: meter download, shows fasting above target and rising gradually throughout the day.  Labs: most recent A1C is 8.6% DIETARY INTAKE: Not assessed in detail Weight- weighs at home, 213# with BMI~ 40  Progress Towards Goal(s):  No progress.   Nutritional Diagnosis:     2.2 altered nutrition related laboratory values of A1C of >7% as related to higher blood sugar remains about the same as evidenced her A1C of 8.6%.  Intervention: Group Nutrition counseling and education about high and low blood sugars, prevention, symptoms and treatment Suggest increase in Toujeo (~10%)  to 35 units Barriers to learning/adherence to lifestyle change: competing values, lack of support Demonstrated degree of understanding via: Teach Back   Monitoring/Evaluation: Dietary intake, exercise, meter and body weight in 4 weeks for group(s)  [name], [name], RD 05/20/2017 9:59 AM.

## 2017-05-20 NOTE — Telephone Encounter (Signed)
Call to patient to advise increase in Toujeo insulin to 35 units daily after discussing her blood sugars with Dr. Lynnae January. Left her messages on both phones. Daughter called back and took message. Patient to call with questions Suggest an updated prescription as she needs 4 pens a month now.

## 2017-05-20 NOTE — Telephone Encounter (Signed)
Also request referral for continued MNT and DSMT.

## 2017-05-20 NOTE — Patient Instructions (Signed)
Good job on leveling off your blood sugars! We know it takes eating healhty and physical activity to do that.  Dr. Lynnae January would like you to increase the Toujeo to 35 units each day. This should lower your fasting blood sugar which will also lower your blood sugars later in the day.  Call me with Questions.  [name]  [phone]

## 2017-06-04 ENCOUNTER — Encounter: Payer: Self-pay | Admitting: Internal Medicine

## 2017-06-09 ENCOUNTER — Encounter: Payer: Self-pay | Admitting: Internal Medicine

## 2017-06-09 ENCOUNTER — Other Ambulatory Visit: Payer: Self-pay | Admitting: Internal Medicine

## 2017-06-09 ENCOUNTER — Ambulatory Visit (INDEPENDENT_AMBULATORY_CARE_PROVIDER_SITE_OTHER): Payer: Medicare Other | Admitting: Internal Medicine

## 2017-06-09 ENCOUNTER — Ambulatory Visit (HOSPITAL_COMMUNITY)
Admission: RE | Admit: 2017-06-09 | Discharge: 2017-06-09 | Disposition: A | Discharge status: Home / Self Care | Payer: Medicare Other | Source: Ambulatory Visit | Attending: Internal Medicine | Admitting: Internal Medicine

## 2017-06-09 VITALS — BP 136/57 | HR 66 | Temp 97.8°F | Ht 61.0 in | Wt 216.0 lb

## 2017-06-09 DIAGNOSIS — Z9189 Other specified personal risk factors, not elsewhere classified: Secondary | ICD-10-CM

## 2017-06-09 DIAGNOSIS — Z794 Long term (current) use of insulin: Secondary | ICD-10-CM | POA: Diagnosis not present

## 2017-06-09 DIAGNOSIS — M7122 Synovial cyst of popliteal space [Baker], left knee: Secondary | ICD-10-CM | POA: Diagnosis not present

## 2017-06-09 DIAGNOSIS — E1140 Type 2 diabetes mellitus with diabetic neuropathy, unspecified: Secondary | ICD-10-CM | POA: Diagnosis not present

## 2017-06-09 DIAGNOSIS — M79662 Pain in left lower leg: Secondary | ICD-10-CM | POA: Diagnosis not present

## 2017-06-09 DIAGNOSIS — I10 Essential (primary) hypertension: Secondary | ICD-10-CM | POA: Diagnosis not present

## 2017-06-09 DIAGNOSIS — R0602 Shortness of breath: Secondary | ICD-10-CM

## 2017-06-09 DIAGNOSIS — I2510 Atherosclerotic heart disease of native coronary artery without angina pectoris: Secondary | ICD-10-CM | POA: Diagnosis not present

## 2017-06-09 DIAGNOSIS — G4733 Obstructive sleep apnea (adult) (pediatric): Secondary | ICD-10-CM | POA: Diagnosis not present

## 2017-06-09 LAB — GLUCOSE, CAPILLARY: GLUCOSE-CAPILLARY: 209 mg/dL — AB (ref 65–99)

## 2017-06-09 LAB — POCT GLYCOSYLATED HEMOGLOBIN (HGB A1C): Hemoglobin A1C: 9

## 2017-06-09 MED ORDER — REPAGLINIDE 2 MG PO TABS: 2.0000 mg | ORAL_TABLET | Freq: Three times a day (TID) | ORAL | 3 refills | Status: DC

## 2017-06-09 NOTE — Assessment & Plan Note (Signed)
This problem is chronic and improved. She now has her CPAP and states that she is resting better now that she is using it. It does not bother her to use it at night.  PLAN : Continue CPAP

## 2017-06-09 NOTE — Progress Notes (Signed)
   Subjective:    Patient ID: [name], female    DOB: November 13, 1950, 66 y.o.   MRN: [id number]  HPI  VALERYE KOBUS is here for L arm and leg pain and swelling. Please see the A&P for the status of the pt's chronic medical problems.  ROS : per ROS section and in problem oriented charting. All other systems are negative.  PMHx, Soc hx, and / or Fam hx : daughter came with her to appt. Had 4 hr car ride t ovisit family   Review of Systems  Constitutional: Positive for activity change and fatigue.  Respiratory: Positive for shortness of breath and wheezing.        +DOE  Cardiovascular: Positive for chest pain, palpitations and leg swelling.  Psychiatric/Behavioral:       Sleeping better with CPAP       Objective:   Physical Exam  Constitutional: She appears well-developed and well-nourished. No distress.  HENT:  Head: Normocephalic and atraumatic.  Right Ear: External ear normal.  Left Ear: External ear normal.  Nose: Nose normal.  Eyes: Conjunctivae and EOM are normal. Right eye exhibits no discharge. Left eye exhibits no discharge. No scleral icterus.  Cardiovascular: Normal rate, regular rhythm, normal heart sounds and intact distal pulses.  Exam reveals no gallop and no friction rub.   No murmur heard. Pulmonary/Chest: Effort normal and breath sounds normal. No respiratory distress. She has no wheezes.  Musculoskeletal: She exhibits no tenderness or deformity.  Trace pitting edema B LE No edema LUE Warm ext No palor  Neurological: She is alert.  Speech fluid, clear, intact, No facial asymmetry.  Skin: Skin is warm and dry. No rash noted. She is not diaphoretic. No erythema.  Psychiatric: She has a normal mood and affect. Her behavior is normal. Judgment and thought content normal.  Vitals reviewed.    Assessment & Plan:

## 2017-06-09 NOTE — Progress Notes (Signed)
*  PRELIMINARY RESULTS* Vascular Ultrasound left lower extremity venous duplex has been completed.  Preliminary findings: No evidence of deep vein thrombosis in the visualized veins of the left lower extremity. Small baker's cyst noted on the left.  Preliminary results called to Dr. Lynnae January @ [place] 06/09/2017, 2:45 PM

## 2017-06-09 NOTE — Progress Notes (Addendum)
Worked with Dr. Lynnae January to initiate anticoagulant therapy for suspected venous thromboembolism. Reviewed clinical history and options with patient. She refused rivaroxaban (Xarelto). Started apixaban (Eliquis) 10 mg BID x 7 days. Eliquis was reviewed with the patient, including name, instructions, indication, goals of therapy, potential side effects, importance of adherence, and safe use. Advised patient to hold aspirin and ibuprofen. Provided recommendations for acetaminophen and topical pain agents.  Patient verbalized understanding by repeating back information and was advised to contact me if further medication-related questions arise. Patient was also provided an information handout.

## 2017-06-09 NOTE — Assessment & Plan Note (Signed)
This problem is chronic and stable. She is on amlodipine 10, Toprol 100, losartan 50, and Lasix 20 when necessary. Her blood pressure is at goal today. She is having no side effects to these medications. We had a trial of stopping her amlodipine to see if her lower extremity edema improved but it did not and she resumed amlodipine.  PLAN:  Cont current meds    BP Readings from Last 3 Encounters:  06/09/17 (!) 136/57  03/11/17 138/66  03/01/17 (!) 125/57

## 2017-06-09 NOTE — Assessment & Plan Note (Addendum)
This problem is new. On September 1, she took a 4 hour car ride to visit family. The next day, the 2nd, she started having left arm swelling and noticed it was bigger in size then normal and bigger than the right arm. It also felt tight and she tried to use her compression stockings on the arm. There was also a pulling sensation about the elbow region. It was cold to touch and she felt that it was hard on palpation but overall puffy. Also on the 2nd, her left calf posteriorly became very painful and she was unable to walk on it. She also felt that it was swollen, cold, and hard. On September 2nd, she returned to her home another 4 hr car drive. On the 3rd, she noticed wheezing. During this time, she was taking Lasix upon improvement in the swelling. She has had significant fatigue, dyspnea, and dyspnea on exertion. She had to stop while walking across her bedroom which is quite unusual for her. She also had to stop while taking her bath due to fatigue and dyspnea which is also unusual. She has also noted palpitations chest pain. She has no personal or family history of DVT.  My pretest probability for a VTE is quite high. Her risk factors are obesity and the car ride. I do not know why she would get a left upper extremity DVT without a foreign body but a left lower extremity DVT and/or PE are still my differential and are high pretest probability. A d-dimer will not help need to rule out VTE. I am starting with a left lower extremity Doppler as if it is positive, I will be able to avoid a CT scan with its radiation and contrast. However, if the Doppler is negative, I will need to then get a CT scan of the chest. Since my pretest probability is high and I do not know when her workup will be completed, I worked with our pharmacy team to get her a DOAC use for the next day or 2 until I can complete her workup.  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  This admission the early 2000. She is up-to-date on her repeat mammogram with the last being in September 2017.  ASSESSMENT : High PTP for VTE, hemodynamically stable  PLAN : DOAC until W/U completed LLE Doppler today If +, cont DOAC If -, CTA   ADDENDUM:  Doppler negative for DVT. + intact Baker's cyst. Due to high pre-tes prob, need CTA. Has DOAC for next 7 days.

## 2017-06-09 NOTE — Assessment & Plan Note (Signed)
This problem is chronic and worsened. Her A1c trend has been 7.3 - 8.6 - 9.0. She is on Toujeo 35 and metformin 1000 twice a day. She increased her insulin from 30-35 on or about August 17. She did get a slight decrease in her average sugar but is still having significant hyperglycemia with just under 200 throughout the day and in the high 200s and 300s of the evening. However she did have a glucose in the 90s on 1 occasion in the late morning. [name], the diabetes educator, and I discussed her regimen and we decided to add Prandin 2 mg before every meal to see if that would help of the postprandial hyperglycemia. We discussed CGM but as she may need a CT scan, we cannot put it on her that we'll consider it in the near future. She denies any hypoglycemic symptoms.  PLAN : Continue Tuojeo 35 Cont met 1000 BID Prandin 2 TID with meals Consider CGM

## 2017-06-09 NOTE — Patient Instructions (Signed)
1. I will call you later today about the leg study results 2. Try the prandin 2 mg before all three meals to lower your sugar 3. See donna at the group meeting

## 2017-06-10 ENCOUNTER — Ambulatory Visit (HOSPITAL_COMMUNITY)
Admission: RE | Admit: 2017-06-10 | Discharge: 2017-06-10 | Disposition: A | Discharge status: Home / Self Care | Payer: Medicare Other | Source: Ambulatory Visit | Attending: Internal Medicine | Admitting: Internal Medicine

## 2017-06-10 DIAGNOSIS — I2510 Atherosclerotic heart disease of native coronary artery without angina pectoris: Secondary | ICD-10-CM | POA: Insufficient documentation

## 2017-06-10 DIAGNOSIS — Z9189 Other specified personal risk factors, not elsewhere classified: Secondary | ICD-10-CM

## 2017-06-10 DIAGNOSIS — R0602 Shortness of breath: Secondary | ICD-10-CM | POA: Diagnosis not present

## 2017-06-10 DIAGNOSIS — I700 Atherosclerosis of aorta: Secondary | ICD-10-CM | POA: Diagnosis not present

## 2017-06-10 DIAGNOSIS — I517 Cardiomegaly: Secondary | ICD-10-CM | POA: Insufficient documentation

## 2017-06-10 DIAGNOSIS — J984 Other disorders of lung: Secondary | ICD-10-CM | POA: Diagnosis not present

## 2017-06-10 LAB — POCT I-STAT CREATININE: CREATININE: 1 mg/dL (ref 0.44–1.00)

## 2017-06-10 MED ORDER — IOPAMIDOL (ISOVUE-370) INJECTION 76%
INTRAVENOUS | Status: AC
  Administered 2017-06-10: 100 mL
  Filled 2017-06-10: qty 100

## 2017-06-13 ENCOUNTER — Ambulatory Visit
Admission: RE | Admit: 2017-06-13 | Discharge: 2017-06-13 | Disposition: A | Discharge status: Home / Self Care | Payer: Medicare Other | Source: Ambulatory Visit | Attending: Internal Medicine | Admitting: Internal Medicine

## 2017-06-13 DIAGNOSIS — Z1231 Encounter for screening mammogram for malignant neoplasm of breast: Secondary | ICD-10-CM

## 2017-06-13 HISTORY — DX: Personal history of irradiation: Z92.3

## 2017-06-23 ENCOUNTER — Other Ambulatory Visit: Payer: Self-pay | Admitting: Internal Medicine

## 2017-06-23 ENCOUNTER — Ambulatory Visit: Payer: Medicare Other | Admitting: Dietician

## 2017-06-23 DIAGNOSIS — Z794 Long term (current) use of insulin: Principal | ICD-10-CM

## 2017-06-23 DIAGNOSIS — E1140 Type 2 diabetes mellitus with diabetic neuropathy, unspecified: Secondary | ICD-10-CM

## 2017-06-23 NOTE — Progress Notes (Signed)
Patient attended diabetes education group visit today for 60 minutes. Hyperglycemia and hypoglycemia were discussed.  Blood sugars: meter download shows 46 test in past 30 days, average is 174, range is 86-321, 63% in target range. Trend graph shows significant improvement in past 2 weeks (since starting prandin), average for past 2 weeks 154, average for past week 128 Weight- not done today Plan: patient plans to attend group diabetes meeting in 1 month. Plyler, [name], RD 06/23/2017 1:16 PM.

## 2017-06-23 NOTE — Patient Instructions (Signed)
Thank you for coming to the group diabetes meeting today.  Your blood sugar over the past week have all been in target!  Way to go!  See you next month on July 21, 2017 at 10:15 AM.   [name] [phone]

## 2017-06-27 DIAGNOSIS — H31001 Unspecified chorioretinal scars, right eye: Secondary | ICD-10-CM | POA: Diagnosis not present

## 2017-06-27 DIAGNOSIS — H35033 Hypertensive retinopathy, bilateral: Secondary | ICD-10-CM | POA: Diagnosis not present

## 2017-06-27 DIAGNOSIS — H40053 Ocular hypertension, bilateral: Secondary | ICD-10-CM | POA: Diagnosis not present

## 2017-06-27 DIAGNOSIS — H18413 Arcus senilis, bilateral: Secondary | ICD-10-CM | POA: Diagnosis not present

## 2017-06-27 DIAGNOSIS — H33302 Unspecified retinal break, left eye: Secondary | ICD-10-CM | POA: Diagnosis not present

## 2017-06-27 DIAGNOSIS — H353121 Nonexudative age-related macular degeneration, left eye, early dry stage: Secondary | ICD-10-CM | POA: Diagnosis not present

## 2017-06-27 DIAGNOSIS — H11423 Conjunctival edema, bilateral: Secondary | ICD-10-CM | POA: Diagnosis not present

## 2017-06-27 DIAGNOSIS — H25013 Cortical age-related cataract, bilateral: Secondary | ICD-10-CM | POA: Diagnosis not present

## 2017-06-27 DIAGNOSIS — H11153 Pinguecula, bilateral: Secondary | ICD-10-CM | POA: Diagnosis not present

## 2017-06-27 DIAGNOSIS — H40023 Open angle with borderline findings, high risk, bilateral: Secondary | ICD-10-CM | POA: Diagnosis not present

## 2017-06-27 DIAGNOSIS — H04123 Dry eye syndrome of bilateral lacrimal glands: Secondary | ICD-10-CM | POA: Diagnosis not present

## 2017-06-27 DIAGNOSIS — H35372 Puckering of macula, left eye: Secondary | ICD-10-CM | POA: Diagnosis not present

## 2017-07-19 DIAGNOSIS — H04123 Dry eye syndrome of bilateral lacrimal glands: Secondary | ICD-10-CM | POA: Diagnosis not present

## 2017-07-19 DIAGNOSIS — H11153 Pinguecula, bilateral: Secondary | ICD-10-CM | POA: Diagnosis not present

## 2017-07-19 DIAGNOSIS — H18413 Arcus senilis, bilateral: Secondary | ICD-10-CM | POA: Diagnosis not present

## 2017-07-19 DIAGNOSIS — H5203 Hypermetropia, bilateral: Secondary | ICD-10-CM | POA: Diagnosis not present

## 2017-07-19 DIAGNOSIS — H353131 Nonexudative age-related macular degeneration, bilateral, early dry stage: Secondary | ICD-10-CM | POA: Diagnosis not present

## 2017-07-19 DIAGNOSIS — H40033 Anatomical narrow angle, bilateral: Secondary | ICD-10-CM | POA: Diagnosis not present

## 2017-07-19 DIAGNOSIS — H52223 Regular astigmatism, bilateral: Secondary | ICD-10-CM | POA: Diagnosis not present

## 2017-07-19 DIAGNOSIS — H35372 Puckering of macula, left eye: Secondary | ICD-10-CM | POA: Diagnosis not present

## 2017-07-19 DIAGNOSIS — H25013 Cortical age-related cataract, bilateral: Secondary | ICD-10-CM | POA: Diagnosis not present

## 2017-07-19 DIAGNOSIS — H11423 Conjunctival edema, bilateral: Secondary | ICD-10-CM | POA: Diagnosis not present

## 2017-07-19 DIAGNOSIS — H40023 Open angle with borderline findings, high risk, bilateral: Secondary | ICD-10-CM | POA: Diagnosis not present

## 2017-07-19 DIAGNOSIS — H524 Presbyopia: Secondary | ICD-10-CM | POA: Diagnosis not present

## 2017-07-19 LAB — HM DIABETES EYE EXAM

## 2017-07-21 ENCOUNTER — Ambulatory Visit: Payer: Medicare Other | Admitting: Dietician

## 2017-07-22 ENCOUNTER — Telehealth: Payer: Self-pay

## 2017-07-22 NOTE — Telephone Encounter (Signed)
Faxed [facility] form @ [phone] on 07/21/2017.

## 2017-07-23 LAB — HM DIABETES EYE EXAM

## 2017-07-25 ENCOUNTER — Encounter: Payer: Self-pay | Admitting: *Deleted

## 2017-08-08 ENCOUNTER — Ambulatory Visit (INDEPENDENT_AMBULATORY_CARE_PROVIDER_SITE_OTHER): Payer: Medicare Other | Admitting: Podiatry

## 2017-08-08 DIAGNOSIS — M76821 Posterior tibial tendinitis, right leg: Secondary | ICD-10-CM

## 2017-08-08 DIAGNOSIS — M659 Synovitis and tenosynovitis, unspecified: Secondary | ICD-10-CM

## 2017-08-11 NOTE — Progress Notes (Signed)
HPI: Patient presents today for follow-up treatment and evaluation of right ankle pain. She states the pain is now returning but reports significant relief after receiving the injection four months ago. Wearing compression socks and elevating the area helps alleviate the pain as well. Patient presents today for further treatment and evaluation.   Past Medical History:  Diagnosis Date  . Allergy   . Arthritis    Knees  . 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 S/p lumpectomy and radiation.  Marland Kitchen CAD (coronary artery disease) 11/07   cath 11/11 :  3V CADsee report   . Cataract    small both eyes  . Chronic pain    MR 08/07/10 :  Spondylosis L4-5 with B foraminal narrowing and L4 nerve root enchroachment B.  . Chronic venous insufficiency 2012   Has had full W/U incl ECHO, LFT's, Cr, Umicro, TSH, BNP. Symptomatic treatment.  . Colonic polyp 1995   Colonoscopies 1994, 2000, and 02/02/2011. Last had 9 polyps - Tubular adenoma and tubulovillous was the pathology results without high grade dysplasia  . Diabetes mellitus type II    Insulin dependent. Has worked with [name] R previously.  . Diastolic CHF, chronic ([place])    NL EF by echo 01/2012, Gr I dd  . Essential hypertension    Requires 5 drug therapy and still difficult to control.  Marland Kitchen GERD (gastroesophageal reflux disease)   . Hyperlipidemia    On daily statin  . Iron deficiency anemia    Ferritin was 12 in 2012. on FeSo4. Has had colon and EGD 02/02/11 that showed mild gastritis and colon polyps.  . Obesity    Morbid. HAs worked with [name] T previously.  . OSA (obstructive sleep apnea) 02/16/2007   02/2007 : Moderate AHI 35.6, O2 sat decreased to 65%, CPAP titration to 16 with AHI of 3.6. 02/2014 : Rare respiratory events with sleep disturbance, within normal limits. AHI 0.4 per hour      . Personal history of radiation therapy   . Seasonal allergies    seasonal  . Sleep apnea    no cpap         Physical Exam: General: The patient is alert and oriented x3 in no acute distress.  Dermatology: Skin is warm, dry and supple bilateral lower extremities. Negative for open lesions or macerations.  Vascular: Palpable pedal pulses bilaterally. No edema or erythema noted. Capillary refill within normal limits.  Neurological: Epicritic and protective threshold grossly intact bilaterally.   Musculoskeletal Exam: There is no longer is any pain to palpation to the medial aspect of the patient's right ankle joint as were the clinical findings on last visit. The pain appears to have been resolved. There is a new pain on palpation to the posterior malleolus are area of the right ankle suggestive of a posterior tibial tendinitis   Assessment: 1. Posterior tibial tendinitis right 2. Synovitis right ankle   Plan of Care:  1. Patient was evaluated. 2. Injection of 0.5 mL Celestone Soluspan injected in the right posterior tibial tendon sheath. Care was taken to avoid direct injection into the tendon. 3. Injection of 0.5 mLs Celestone Soluspan injected into the right ankle joint. 4. Continue to take OTC Motrin as directed.  5. Recommended good supportive ankle brace from a sports store.  6. Return to clinic when necessary.    Edrick Kins, DPM [facility]  Dr. Edrick Kins, DPM  [address], [place] [address]                Office [phone]  Fax [phone]

## 2017-08-15 MED ORDER — BETAMETHASONE SOD PHOS & ACET 6 (3-3) MG/ML IJ SUSP: 3.0000 mg | Freq: Once | INTRAMUSCULAR | Status: DC

## 2017-08-17 ENCOUNTER — Telehealth: Payer: Self-pay | Admitting: Dietician

## 2017-08-18 ENCOUNTER — Other Ambulatory Visit: Payer: Self-pay

## 2017-08-18 ENCOUNTER — Encounter (HOSPITAL_COMMUNITY): Payer: Self-pay | Admitting: Emergency Medicine

## 2017-08-18 ENCOUNTER — Emergency Department (HOSPITAL_COMMUNITY): Payer: Medicare Other

## 2017-08-18 ENCOUNTER — Inpatient Hospital Stay (HOSPITAL_COMMUNITY)
Admission: EM | Admit: 2017-08-18 | Discharge: 2017-08-20 | DRG: 287 | Disposition: A | Discharge status: Home / Self Care | Payer: Medicare Other | Attending: Cardiology | Admitting: Cardiology

## 2017-08-18 DIAGNOSIS — E876 Hypokalemia: Secondary | ICD-10-CM | POA: Diagnosis not present

## 2017-08-18 DIAGNOSIS — Z7982 Long term (current) use of aspirin: Secondary | ICD-10-CM

## 2017-08-18 DIAGNOSIS — Z853 Personal history of malignant neoplasm of breast: Secondary | ICD-10-CM | POA: Diagnosis not present

## 2017-08-18 DIAGNOSIS — I10 Essential (primary) hypertension: Secondary | ICD-10-CM

## 2017-08-18 DIAGNOSIS — I2510 Atherosclerotic heart disease of native coronary artery without angina pectoris: Secondary | ICD-10-CM | POA: Diagnosis present

## 2017-08-18 DIAGNOSIS — Z888 Allergy status to other drugs, medicaments and biological substances status: Secondary | ICD-10-CM | POA: Diagnosis not present

## 2017-08-18 DIAGNOSIS — Z9189 Other specified personal risk factors, not elsewhere classified: Secondary | ICD-10-CM | POA: Diagnosis not present

## 2017-08-18 DIAGNOSIS — Z885 Allergy status to narcotic agent status: Secondary | ICD-10-CM

## 2017-08-18 DIAGNOSIS — I700 Atherosclerosis of aorta: Secondary | ICD-10-CM | POA: Diagnosis not present

## 2017-08-18 DIAGNOSIS — D509 Iron deficiency anemia, unspecified: Secondary | ICD-10-CM | POA: Diagnosis present

## 2017-08-18 DIAGNOSIS — Z6839 Body mass index (BMI) 39.0-39.9, adult: Secondary | ICD-10-CM

## 2017-08-18 DIAGNOSIS — I200 Unstable angina: Secondary | ICD-10-CM | POA: Diagnosis present

## 2017-08-18 DIAGNOSIS — Z8542 Personal history of malignant neoplasm of other parts of uterus: Secondary | ICD-10-CM

## 2017-08-18 DIAGNOSIS — Z923 Personal history of irradiation: Secondary | ICD-10-CM | POA: Diagnosis not present

## 2017-08-18 DIAGNOSIS — E119 Type 2 diabetes mellitus without complications: Secondary | ICD-10-CM

## 2017-08-18 DIAGNOSIS — Z789 Other specified health status: Secondary | ICD-10-CM

## 2017-08-18 DIAGNOSIS — I351 Nonrheumatic aortic (valve) insufficiency: Secondary | ICD-10-CM | POA: Diagnosis not present

## 2017-08-18 DIAGNOSIS — I272 Pulmonary hypertension, unspecified: Secondary | ICD-10-CM | POA: Diagnosis present

## 2017-08-18 DIAGNOSIS — K219 Gastro-esophageal reflux disease without esophagitis: Secondary | ICD-10-CM | POA: Diagnosis present

## 2017-08-18 DIAGNOSIS — Z794 Long term (current) use of insulin: Secondary | ICD-10-CM

## 2017-08-18 DIAGNOSIS — I872 Venous insufficiency (chronic) (peripheral): Secondary | ICD-10-CM | POA: Diagnosis present

## 2017-08-18 DIAGNOSIS — E785 Hyperlipidemia, unspecified: Secondary | ICD-10-CM | POA: Diagnosis not present

## 2017-08-18 DIAGNOSIS — Z9221 Personal history of antineoplastic chemotherapy: Secondary | ICD-10-CM

## 2017-08-18 DIAGNOSIS — I25110 Atherosclerotic heart disease of native coronary artery with unstable angina pectoris: Secondary | ICD-10-CM | POA: Diagnosis not present

## 2017-08-18 DIAGNOSIS — R079 Chest pain, unspecified: Secondary | ICD-10-CM | POA: Diagnosis not present

## 2017-08-18 DIAGNOSIS — R0602 Shortness of breath: Secondary | ICD-10-CM | POA: Diagnosis not present

## 2017-08-18 DIAGNOSIS — G4733 Obstructive sleep apnea (adult) (pediatric): Secondary | ICD-10-CM | POA: Diagnosis not present

## 2017-08-18 DIAGNOSIS — R600 Localized edema: Secondary | ICD-10-CM | POA: Diagnosis not present

## 2017-08-18 DIAGNOSIS — E1151 Type 2 diabetes mellitus with diabetic peripheral angiopathy without gangrene: Secondary | ICD-10-CM | POA: Diagnosis present

## 2017-08-18 DIAGNOSIS — I25118 Atherosclerotic heart disease of native coronary artery with other forms of angina pectoris: Secondary | ICD-10-CM | POA: Diagnosis not present

## 2017-08-18 DIAGNOSIS — I340 Nonrheumatic mitral (valve) insufficiency: Secondary | ICD-10-CM | POA: Diagnosis not present

## 2017-08-18 DIAGNOSIS — Z79899 Other long term (current) drug therapy: Secondary | ICD-10-CM

## 2017-08-18 HISTORY — DX: Obstructive sleep apnea (adult) (pediatric): G47.33

## 2017-08-18 HISTORY — DX: Other chronic pain: G89.29

## 2017-08-18 HISTORY — DX: Procedure and treatment not carried out because of patient's decision for reasons of belief and group pressure: Z53.1

## 2017-08-18 HISTORY — DX: Low back pain, unspecified: M54.50

## 2017-08-18 HISTORY — DX: Dependence on other enabling machines and devices: Z99.89

## 2017-08-18 HISTORY — DX: Reserved for inherently not codable concepts without codable children: IMO0001

## 2017-08-18 HISTORY — DX: Low back pain: M54.5

## 2017-08-18 HISTORY — DX: Cardiac murmur, unspecified: R01.1

## 2017-08-18 LAB — CBC
HCT: 37.6 % (ref 36.0–46.0)
Hemoglobin: 12.1 g/dL (ref 12.0–15.0)
MCH: 28.6 pg (ref 26.0–34.0)
MCHC: 32.2 g/dL (ref 30.0–36.0)
MCV: 88.9 fL (ref 78.0–100.0)
PLATELETS: 241 10*3/uL (ref 150–400)
RBC: 4.23 MIL/uL (ref 3.87–5.11)
RDW: 15.4 % (ref 11.5–15.5)
WBC: 8.3 10*3/uL (ref 4.0–10.5)

## 2017-08-18 LAB — I-STAT TROPONIN, ED: TROPONIN I, POC: 0 ng/mL (ref 0.00–0.08)

## 2017-08-18 LAB — BASIC METABOLIC PANEL
Anion gap: 8 (ref 5–15)
BUN: 13 mg/dL (ref 6–20)
CHLORIDE: 104 mmol/L (ref 101–111)
CO2: 27 mmol/L (ref 22–32)
CREATININE: 1.08 mg/dL — AB (ref 0.44–1.00)
Calcium: 9.6 mg/dL (ref 8.9–10.3)
GFR calc non Af Amer: 52 mL/min — ABNORMAL LOW (ref >60)
Glucose, Bld: 217 mg/dL — ABNORMAL HIGH (ref 65–99)
Potassium: 3.4 mmol/L — ABNORMAL LOW (ref 3.5–5.1)
SODIUM: 139 mmol/L (ref 135–145)

## 2017-08-18 LAB — HEPATIC FUNCTION PANEL
ALT: 22 U/L (ref 14–54)
AST: 25 U/L (ref 15–41)
Albumin: 3.7 g/dL (ref 3.5–5.0)
Alkaline Phosphatase: 81 U/L (ref 38–126)
Bilirubin, Direct: <0.1 mg/dL — ABNORMAL LOW (ref 0.1–0.5)
Total Bilirubin: 0.3 mg/dL (ref 0.3–1.2)
Total Protein: 7.2 g/dL (ref 6.5–8.1)

## 2017-08-18 LAB — GLUCOSE, CAPILLARY: Glucose-Capillary: 132 mg/dL — ABNORMAL HIGH (ref 65–99)

## 2017-08-18 LAB — BRAIN NATRIURETIC PEPTIDE: B Natriuretic Peptide: 86.9 pg/mL (ref 0.0–100.0)

## 2017-08-18 LAB — D-DIMER, QUANTITATIVE (NOT AT ARMC): D DIMER QUANT: 0.78 ug{FEU}/mL — AB (ref 0.00–0.50)

## 2017-08-18 MED ORDER — EZETIMIBE 10 MG PO TABS
10.0000 mg | ORAL_TABLET | Freq: Every day | ORAL | Status: DC
  Administered 2017-08-19 – 2017-08-20 (×2): 10 mg via ORAL
  Filled 2017-08-18 (×2): qty 1

## 2017-08-18 MED ORDER — ASPIRIN EC 81 MG PO TBEC
81.0000 mg | DELAYED_RELEASE_TABLET | Freq: Every day | ORAL | Status: DC
  Administered 2017-08-19: 81 mg via ORAL
  Filled 2017-08-18: qty 1

## 2017-08-18 MED ORDER — ISOSORBIDE MONONITRATE ER 60 MG PO TB24
60.0000 mg | ORAL_TABLET | Freq: Every day | ORAL | Status: DC
  Administered 2017-08-19: 60 mg via ORAL
  Filled 2017-08-18: qty 1

## 2017-08-18 MED ORDER — AMLODIPINE BESYLATE 10 MG PO TABS
10.0000 mg | ORAL_TABLET | Freq: Every day | ORAL | Status: DC
  Filled 2017-08-18: qty 1

## 2017-08-18 MED ORDER — NITROGLYCERIN 0.4 MG SL SUBL: 0.4000 mg | SUBLINGUAL_TABLET | SUBLINGUAL | Status: DC | PRN

## 2017-08-18 MED ORDER — IOPAMIDOL (ISOVUE-370) INJECTION 76%
INTRAVENOUS | Status: AC
  Administered 2017-08-18: 100 mL
  Filled 2017-08-18: qty 100

## 2017-08-18 MED ORDER — METOPROLOL SUCCINATE ER 100 MG PO TB24
100.0000 mg | ORAL_TABLET | Freq: Every day | ORAL | Status: DC
Start: 2017-08-19 — End: 2017-08-20
  Administered 2017-08-19 – 2017-08-20 (×2): 100 mg via ORAL
  Filled 2017-08-18 (×2): qty 1

## 2017-08-18 NOTE — ED Triage Notes (Addendum)
Per EMS. Pt c/o Cp 10/10 while ambulating. Pt has Hx of CHF but states this feels like something else. Pt also c/o of L shoulder pain. Pt has taken 1 of nitro 324  ASP and also had on the nitro patch. Pain has not subsided.  Pt ambulatory and A&Ox4. Pt from Home.

## 2017-08-18 NOTE — ED Provider Notes (Signed)
[facility] EMERGENCY DEPARTMENT Provider Note   CSN: [id number] Arrival date & time: 08/18/17  1559     History   Chief Complaint Chief Complaint  Patient presents with  . Chest Pain    HPI [name] is a 66 y.o. female.  HPI Presents with sharp left-sided chest pain that radiates to her left upper arm that is worsened with minimal exertion.  She states this is been going on for the past 2 weeks.  States the pain is relieved with nitroglycerin and rest.  She has associated shortness of breath.  She also states she wakes up in the middle the night short of breath.  She denies any fever or chills.  No recent cough.  Currently denies any chest pain. Past Medical History:  Diagnosis Date  . Allergy   . Arthritis    Knees  . 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 S/p lumpectomy and radiation.  Marland Kitchen CAD (coronary artery disease) 11/07   cath 11/11 :  3V CADsee report   . Cataract    small both eyes  . Chronic pain    MR 08/07/10 :  Spondylosis L4-5 with B foraminal narrowing and L4 nerve root enchroachment B.  . Chronic venous insufficiency 2012   Has had full W/U incl ECHO, LFT's, Cr, Umicro, TSH, BNP. Symptomatic treatment.  . Colonic polyp 1995   Colonoscopies 1994, 2000, and 02/02/2011. Last had 9 polyps - Tubular adenoma and tubulovillous was the pathology results without high grade dysplasia  . Diabetes mellitus type II    Insulin dependent. Has worked with [name] R previously.  . Diastolic CHF, chronic ([place])    NL EF by echo 01/2012, Gr I dd  . Essential hypertension    Requires 5 drug therapy and still difficult to control.  Marland Kitchen GERD (gastroesophageal reflux disease)   . Hyperlipidemia    On daily statin  . Iron deficiency anemia    Ferritin was 12 in 2012. on FeSo4. Has had colon and EGD 02/02/11 that showed mild gastritis and colon polyps.  . Obesity    Morbid. HAs worked with [name] T previously.    . OSA (obstructive sleep apnea) 02/16/2007   02/2007 : Moderate AHI 35.6, O2 sat decreased to 65%, CPAP titration to 16 with AHI of 3.6. 02/2014 : Rare respiratory events with sleep disturbance, within normal limits. AHI 0.4 per hour      . Personal history of radiation therapy   . Seasonal allergies    seasonal  . Sleep apnea    no cpap     Patient Active Problem List   Diagnosis Date Noted  . At high risk for venous thromboembolism (VTE) 06/09/2017  . Elevated TSH 08/15/2015  . Prolonged Q-T interval on ECG 08/14/2015  . Diabetic retinopathy ([place]) 03/26/2015  . Abnormality of gait 01/06/2013  . CTS (carpal tunnel syndrome) 03/09/2012  . Urinary frequency 07/14/2011  . Diabetic neuropathy ([place]) 06/11/2011  . Chronic venous insufficiency   . Routine health maintenance 03/09/2011  . CAD (coronary artery disease)   . Benign essential HTN   . Hyperlipidemia   . Colonic polyp   . Morbid obesity ([place])   . Chronic pain   . History of breast cancer   . Iron deficiency anemia   . Seasonal allergies   . OSA (obstructive sleep apnea) 02/16/2007  . DM neuropathy, type II diabetes mellitus ([place]) 10/04/2004  Past Surgical History:  Procedure Laterality Date  . BREAST LUMPECTOMY Left 2002  . BREAST LUMPECTOMY W/ NEEDLE LOCALIZATION  2006   34 Chemo RX  . CARDIAC CATHETERIZATION N/A 08/15/2015   Procedure: Left Heart Cath and Coronary Angiography;  Surgeon: Troy Sine, MD;  Location: [facility];  Service: Cardiovascular;  Laterality: N/A;  . COLONOSCOPY    . COLONOSCOPY W/ POLYPECTOMY  1994, 2000, 2012  . POLYPECTOMY    . TOTAL ABDOMINAL HYSTERECTOMY  1995   2/2 uterine cancer    OB History    No data available       Home Medications    Prior to Admission medications   Medication Sig Start Date End Date Taking? Authorizing Provider  amLODipine (NORVASC) 10 MG tablet TAKE ONE (1) TABLET BY MOUTH EVERY DAY Patient taking differently: TAKE 10 mg  TABLET BY MOUTH  EVERY DAY 12/03/16  Yes Bartholomew Crews, MD  aspirin EC 81 MG tablet Take 81 mg by mouth daily.   Yes [provider]  cetirizine (ZYRTEC) 10 MG tablet Take 10 mg as needed by mouth for allergies.   Yes [provider]  Coenzyme Q10 (CO Q 10 PO) Take by mouth.   Yes [provider]  ezetimibe (ZETIA) 10 MG tablet TAKE ONE (1) TABLET BY MOUTH EVERY DAY PLEASE CALL TO SCHEDULE AN APPOINTMENT 02/10/17  Yes Minus Breeding, MD  furosemide (LASIX) 20 MG tablet Take 1 tablet (20 mg total) by mouth 2 (two) times daily as needed. 02/27/16  Yes Bartholomew Crews, MD  ibuprofen (ADVIL,MOTRIN) 800 MG tablet TAKE 1 TABLET BY MOUTH EVERY EIGHT HOURSAS NEEDED FOR PAIN Patient taking differently: TAKE 800 mg TABLET BY MOUTH EVERY EIGHT HOURSAS NEEDED FOR PAIN 01/05/17  Yes Bartholomew Crews, MD  Insulin Glargine (TOUJEO SOLOSTAR) 300 UNIT/ML SOPN Inject 35 Units into the skin daily. 05/20/17  Yes Bartholomew Crews, MD  isosorbide mononitrate (IMDUR) 60 MG 24 hr tablet TAKE ONE (1) TABLET BY MOUTH EVERY DAY Patient taking differently: TAKE 60 mg TABLET BY MOUTH EVERY DAY 02/07/17  Yes Minus Breeding, MD  losartan (COZAAR) 50 MG tablet TAKE ONE (1) TABLET BY MOUTH EVERY DAY Patient taking differently: Take 50 mg daily by mouth. TAKE ONE (1) TABLET BY MOUTH EVERY DAY 11/04/16  Yes Bartholomew Crews, MD  metFORMIN (GLUCOPHAGE) 1000 MG tablet TAKE ONE (1) TABLET BY MOUTH TWO (2) TIMES DAILY WITH A MEAL Patient taking differently: Take 1,000 mg 2 (two) times daily by mouth. TAKE ONE (1) TABLET BY MOUTH TWO (2) TIMES DAILY WITH A MEAL 09/08/16  Yes Bartholomew Crews, MD  metoprolol succinate (TOPROL-XL) 100 MG 24 hr tablet TAKE ONE (1) TABLET BY MOUTH EVERY DAY Patient taking differently: TAKE 100 gm TABLET BY MOUTH EVERY DAY 12/27/16  Yes Bartholomew Crews, MD  Multiple Vitamins-Minerals (MULTIVITAMIN ADULTS PO) Take by mouth.   Yes [provider]  nitroGLYCERIN (NITRODUR  - DOSED IN MG/24 HR) 0.4 mg/hr patch APPLY ONE PATCH ON SKIN EVERY DAY. LEAVE ON FOR 12 HOURS AND OFF FOR 12 HOURS. 11/05/16  Yes Bartholomew Crews, MD  nitroGLYCERIN (NITROSTAT) 0.4 MG SL tablet Place 1 tablet (0.4 mg total) under the tongue every 5 (five) minutes as needed for chest pain. 01/06/15 08/18/17 Yes Bartholomew Crews, MD  Olopatadine HCl (PATADAY) 0.2 % SOLN Place 1 drop into both eyes daily as needed. For allergies   Yes [provider]  repaglinide (PRANDIN) 2  MG tablet Take 1 tablet (2 mg total) by mouth 3 (three) times daily before meals. 06/09/17  Yes Bartholomew Crews, MD  Turmeric, Lear Ng, POWD 1 scoop by Does not apply route daily.   Yes [provider]  loratadine (CLARITIN) 10 MG tablet TAKE ONE TABLET BY MOUTH ONCE DAILY FOR ALLERGIES Patient not taking: Reported on 03/11/2017 05/08/13   Bartholomew Crews, MD    Family History Family History  Problem Relation Age of Onset  . Lung cancer Mother   . Diabetes Father   . Breast cancer Sister   . Pulmonary embolism Brother   . Depression Daughter 9  . Osteoarthritis Maternal Grandmother   . Heart attack Brother 10  . Kidney failure Brother        died January 21, 2016 2/2 kidney and heart failure  . Colon cancer Neg Hx   . Colon polyps Neg Hx   . Esophageal cancer Neg Hx   . Rectal cancer Neg Hx   . Stomach cancer Neg Hx     Social History Social History   Tobacco Use  . Smoking status: Never Smoker  . Smokeless tobacco: Never Used  Substance Use Topics  . Alcohol use: No    Alcohol/week: 0.0 oz  . Drug use: No     Allergies   Lisinopril; Oxycodone-acetaminophen; Victoza [liraglutide]; and Atorvastatin   Review of Systems Review of Systems  Constitutional: Positive for fatigue. Negative for chills and fever.  Respiratory: Positive for shortness of breath. Negative for cough and wheezing.   Cardiovascular: Positive for chest pain. Negative for palpitations and leg swelling.    Gastrointestinal: Negative for abdominal pain, diarrhea, nausea and vomiting.  Genitourinary: Negative for difficulty urinating, dysuria, flank pain and frequency.  Musculoskeletal: Negative for back pain, myalgias and neck pain.  Skin: Negative for rash and wound.  Neurological: Positive for weakness. Negative for dizziness, light-headedness, numbness and headaches.       Generalized weakness  All other systems reviewed and are negative.    Physical Exam Updated Vital Signs BP (!) 157/86   Pulse 85   Temp 98 F (36.7 C) (Oral)   Resp 15   Ht 5\' 2"  (1.575 m)   Wt 98 kg (216 lb)   LMP 07/12/1984   SpO2 94%   BMI 39.51 kg/m   Physical Exam  Constitutional: She is oriented to person, place, and time. She appears well-developed and well-nourished.  Non-toxic appearance. She does not appear ill. No distress.  HENT:  Head: Normocephalic and atraumatic.  Mouth/Throat: Oropharynx is clear and moist.  Eyes: EOM are normal. Pupils are equal, round, and reactive to light.  Neck: Normal range of motion. Neck supple.  Cardiovascular: Normal rate and regular rhythm.  Pulmonary/Chest: Effort normal.  Diminished breath sounds bilateral bases.  Abdominal: Soft. Bowel sounds are normal. There is no tenderness. There is no rebound and no guarding.  Musculoskeletal: Normal range of motion. She exhibits no edema or tenderness.  Right lower extremity is slightly larger compared to left.  No calf tenderness.  Neurological: She is alert and oriented to person, place, and time.  Moves all extremities without focal deficit.  Sensation fully intact.  Skin: Skin is warm and dry. No rash noted. No erythema.  Psychiatric: She has a normal mood and affect. Her behavior is normal.  Nursing note and vitals reviewed.    ED Treatments / Results  Labs (all labs ordered are listed, but only abnormal results are displayed) Labs Reviewed  BASIC METABOLIC PANEL - Abnormal; Notable for the following  components:      Result Value   Potassium 3.4 (*)    Glucose, Bld 217 (*)    Creatinine, Ser 1.08 (*)    GFR calc non Af Amer 52 (*)    All other components within normal limits  HEPATIC FUNCTION PANEL - Abnormal; Notable for the following components:   Bilirubin, Direct <0.1 (*)    All other components within normal limits  D-DIMER, QUANTITATIVE (NOT AT [facility]) - Abnormal; Notable for the following components:   D-Dimer, Quant 0.78 (*)    All other components within normal limits  CBC  BRAIN NATRIURETIC PEPTIDE  I-STAT TROPONIN, ED    EKG  EKG Interpretation  Date/Time:  Thursday August 18 2017 16:18:24 EST Ventricular Rate:  92 PR Interval:    QRS Duration: 108 QT Interval:  382 QTC Calculation: 473 R Axis:   14 Text Interpretation:  Sinus rhythm Low voltage, extremity and precordial leads Consider anterior infarct Baseline wander in lead(s) I II aVR Confirmed by Julianne Rice [phone]) on 08/18/2017 4:29:41 PM       Radiology Dg Chest 2 View  Result Date: 08/18/2017 CLINICAL DATA:  Acute chest pain and shortness of breath for 4 days. EXAM: CHEST  2 VIEW COMPARISON:  06/10/2017 CT, 08/14/2015 radiographs and prior studies. FINDINGS: The cardiomediastinal silhouette is unremarkable. Mild peribronchial thickening is unchanged. There is no evidence of focal airspace disease, pulmonary edema, suspicious pulmonary nodule/mass, pleural effusion, or pneumothorax. No acute bony abnormalities are identified. IMPRESSION: No evidence of acute cardiopulmonary disease. Electronically Signed   By: Margarette Canada M.D.   On: 08/18/2017 16:55   Ct Angio Chest Pe W And/or Wo Contrast  Result Date: 08/18/2017 CLINICAL DATA:  Shoulder pain, chest pain EXAM: CT ANGIOGRAPHY CHEST WITH CONTRAST TECHNIQUE: Multidetector CT imaging of the chest was performed using the standard protocol during bolus administration of intravenous contrast. Multiplanar CT image reconstructions and MIPs were obtained to  evaluate the vascular anatomy. CONTRAST:  154mL ISOVUE-370 IOPAMIDOL (ISOVUE-370) INJECTION 76% COMPARISON:  08/18/2017, 06/10/2017 FINDINGS: Cardiovascular: Satisfactory opacification of the pulmonary arteries to the segmental level. No evidence of pulmonary embolism. Aortic atherosclerosis. No aneurysmal dilatation. Coronary artery calcification. Enlarged pulmonary artery trunk measuring 3.1 cm. Mild cardiomegaly. No large pericardial effusion Mediastinum/Nodes: No enlarged mediastinal, hilar, or axillary lymph nodes. Thyroid gland, trachea, and esophagus demonstrate no significant findings. Lungs/Pleura: Lungs are clear. No pleural effusion or pneumothorax. Upper Abdomen: No acute abnormality. Musculoskeletal: Presumed post lumpectomy changes of the retroareolar left breast, similar compared to prior. No acute or suspicious bone lesion. Degenerative changes Review of the MIP images confirms the above findings. IMPRESSION: 1. Negative for acute pulmonary embolus or aortic dissection 2. Enlarged pulmonary artery trunk, raises possibility for pulmonary hypertension 3. Clear lung fields Aortic Atherosclerosis (ICD10-I70.0). Electronically Signed   By: Donavan Foil M.D.   On: 08/18/2017 19:01    Procedures Procedures (including critical care time)  Medications Ordered in ED Medications  iopamidol (ISOVUE-370) 76 % injection (100 mLs  Contrast Given 08/18/17 1833)     Initial Impression / Assessment and Plan / ED Course  I have reviewed the triage vital signs and the nursing notes.  Pertinent labs & imaging results that were available during my care of the patient were reviewed by me and considered in my medical decision making (see chart for details).     Vital signs remained stable.  Patient remains chest pain-free.  D-dimer was  mildly elevated.  CT without evidence of PE.  Questionable pulmonary hypertension.  Discussed with cardiology who will see patient in the emergency department and admit  for planned catheterization tomorrow.  Final Clinical Impressions(s) / ED Diagnoses   Final diagnoses:  Unstable angina [facility])    ED Discharge Orders    None       Julianne Rice, MD 08/18/17 [phone]

## 2017-08-18 NOTE — H&P (Signed)
Cardiology Admission History and Physical:   Patient ID: [name]; MRN: [id number]; DOB: [birth year]   Admission date: 08/18/2017  Primary Care Provider: Bartholomew Crews, MD Primary Cardiologist: Dr. Percival Spanish Primary Electrophysiologist:  none  Chief Complaint: Chest pain  Patient Profile:   [name] is a 66 y.o. female with a history of severe triple-vessel coronary artery disease managed medically with a pattern of chronic stable angina   History of Present Illness:   [name] is 66 years old female with obesity, diabetes, hypertension, hyperlipidemia with chronic stable angina, medically managed aggressively since last catheterization in 2016 who now presents the emergency department with increasing anginal symptoms, complaining of 10/10 chest pain when ambulating.  For instance today she was going to her mailbox which is 300 feet away and she was having a lot of chest pain.  She has been taking nitroglycerin at home with increasing frequency.  Pain is severe.  She is currently chest pain-free.  Troponin thus far is negative.  EKG does not show any ischemic changes.  She does also have some left shoulder pain.  At times she says that some of her pain is sharp.  This is been increasing over the past 2 weeks.  Sometimes she wakes up in the middle night short of breath as well.  No recent fevers chills nausea vomiting syncope.  She has had a prior history of breast cancer in 2006.  She is also had a prior history of chronic pain, back pain.  She is being treated for obstructive sleep apnea, no CPAP.  She is Jehovah's Witness.   Past Medical History:  Diagnosis Date  . Allergy   . Arthritis    Knees  . 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 S/p lumpectomy and radiation.  Marland Kitchen CAD (coronary artery disease) 11/07   cath 11/11 :  3V CADsee report   . Cataract    small both eyes  . Chronic pain    MR 08/07/10 :   Spondylosis L4-5 with B foraminal narrowing and L4 nerve root enchroachment B.  . Chronic venous insufficiency 2012   Has had full W/U incl ECHO, LFT's, Cr, Umicro, TSH, BNP. Symptomatic treatment.  . Colonic polyp 1995   Colonoscopies 1994, 2000, and 02/02/2011. Last had 9 polyps - Tubular adenoma and tubulovillous was the pathology results without high grade dysplasia  . Diabetes mellitus type II    Insulin dependent. Has worked with [name] R previously.  . Diastolic CHF, chronic ([place])    NL EF by echo 01/2012, Gr I dd  . Essential hypertension    Requires 5 drug therapy and still difficult to control.  Marland Kitchen GERD (gastroesophageal reflux disease)   . Hyperlipidemia    On daily statin  . Iron deficiency anemia    Ferritin was 12 in 2012. on FeSo4. Has had colon and EGD 02/02/11 that showed mild gastritis and colon polyps.  . Obesity    Morbid. HAs worked with [name] T previously.  . OSA (obstructive sleep apnea) 02/16/2007   02/2007 : Moderate AHI 35.6, O2 sat decreased to 65%, CPAP titration to 16 with AHI of 3.6. 02/2014 : Rare respiratory events with sleep disturbance, within normal limits. AHI 0.4 per hour      . Personal history of radiation therapy   . Seasonal allergies    seasonal  . Sleep apnea    no cpap     Past Surgical  History:  Procedure Laterality Date  . BREAST LUMPECTOMY Left 2002  . BREAST LUMPECTOMY W/ NEEDLE LOCALIZATION  2006   34 Chemo RX  . CARDIAC CATHETERIZATION N/A 08/15/2015   Procedure: Left Heart Cath and Coronary Angiography;  Surgeon: Troy Sine, MD;  Location: [facility];  Service: Cardiovascular;  Laterality: N/A;  . COLONOSCOPY    . COLONOSCOPY W/ POLYPECTOMY  1994, 2000, 2012  . POLYPECTOMY    . TOTAL ABDOMINAL HYSTERECTOMY  1995   2/2 uterine cancer     Medications Prior to Admission: Prior to Admission medications   Medication Sig Start Date End Date Taking? Authorizing Provider  amLODipine (NORVASC) 10 MG tablet TAKE ONE (1) TABLET BY  MOUTH EVERY DAY Patient taking differently: TAKE 10 mg  TABLET BY MOUTH EVERY DAY 12/03/16  Yes Bartholomew Crews, MD  aspirin EC 81 MG tablet Take 81 mg by mouth daily.   Yes [provider]  cetirizine (ZYRTEC) 10 MG tablet Take 10 mg as needed by mouth for allergies.   Yes [provider]  Coenzyme Q10 (CO Q 10 PO) Take by mouth.   Yes [provider]  ezetimibe (ZETIA) 10 MG tablet TAKE ONE (1) TABLET BY MOUTH EVERY DAY PLEASE CALL TO SCHEDULE AN APPOINTMENT 02/10/17  Yes Minus Breeding, MD  furosemide (LASIX) 20 MG tablet Take 1 tablet (20 mg total) by mouth 2 (two) times daily as needed. 02/27/16  Yes Bartholomew Crews, MD  ibuprofen (ADVIL,MOTRIN) 800 MG tablet TAKE 1 TABLET BY MOUTH EVERY EIGHT HOURSAS NEEDED FOR PAIN Patient taking differently: TAKE 800 mg TABLET BY MOUTH EVERY EIGHT HOURSAS NEEDED FOR PAIN 01/05/17  Yes Bartholomew Crews, MD  Insulin Glargine (TOUJEO SOLOSTAR) 300 UNIT/ML SOPN Inject 35 Units into the skin daily. 05/20/17  Yes Bartholomew Crews, MD  isosorbide mononitrate (IMDUR) 60 MG 24 hr tablet TAKE ONE (1) TABLET BY MOUTH EVERY DAY Patient taking differently: TAKE 60 mg TABLET BY MOUTH EVERY DAY 02/07/17  Yes Minus Breeding, MD  losartan (COZAAR) 50 MG tablet TAKE ONE (1) TABLET BY MOUTH EVERY DAY Patient taking differently: Take 50 mg daily by mouth. TAKE ONE (1) TABLET BY MOUTH EVERY DAY 11/04/16  Yes Bartholomew Crews, MD  metFORMIN (GLUCOPHAGE) 1000 MG tablet TAKE ONE (1) TABLET BY MOUTH TWO (2) TIMES DAILY WITH A MEAL Patient taking differently: Take 1,000 mg 2 (two) times daily by mouth. TAKE ONE (1) TABLET BY MOUTH TWO (2) TIMES DAILY WITH A MEAL 09/08/16  Yes Bartholomew Crews, MD  metoprolol succinate (TOPROL-XL) 100 MG 24 hr tablet TAKE ONE (1) TABLET BY MOUTH EVERY DAY Patient taking differently: TAKE 100 gm TABLET BY MOUTH EVERY DAY 12/27/16  Yes Bartholomew Crews, MD  Multiple Vitamins-Minerals (MULTIVITAMIN ADULTS  PO) Take by mouth.   Yes [provider]  nitroGLYCERIN (NITRODUR - DOSED IN MG/24 HR) 0.4 mg/hr patch APPLY ONE PATCH ON SKIN EVERY DAY. LEAVE ON FOR 12 HOURS AND OFF FOR 12 HOURS. 11/05/16  Yes Bartholomew Crews, MD  nitroGLYCERIN (NITROSTAT) 0.4 MG SL tablet Place 1 tablet (0.4 mg total) under the tongue every 5 (five) minutes as needed for chest pain. 01/06/15 08/18/17 Yes Bartholomew Crews, MD  Olopatadine HCl (PATADAY) 0.2 % SOLN Place 1 drop into both eyes daily as needed. For allergies   Yes [provider]  repaglinide (PRANDIN) 2 MG tablet Take 1 tablet (2 mg total) by mouth 3 (three) times daily before meals.  06/09/17  Yes Bartholomew Crews, MD  Turmeric, Lear Ng, POWD 1 scoop by Does not apply route daily.   Yes [provider]  loratadine (CLARITIN) 10 MG tablet TAKE ONE TABLET BY MOUTH ONCE DAILY FOR ALLERGIES Patient not taking: Reported on 03/11/2017 05/08/13   Bartholomew Crews, MD     Allergies:    Allergies  Allergen Reactions  . Lisinopril Other (See Comments)    Chronic cough secondary to ACE  . Oxycodone-Acetaminophen Hives and Itching    flushing  . Victoza [Liraglutide]     Made her feel "out of it"  . Atorvastatin Other (See Comments)    myalgias    Social History:   Social History   Socioeconomic History  . Marital status: Divorced    Spouse name: Not on file  . Number of children: Not on file  . Years of education: 71  . Highest education level: Not on file  Social Needs  . Financial resource strain: Not on file  . Food insecurity - worry: Not on file  . Food insecurity - inability: Not on file  . Transportation needs - medical: Not on file  . Transportation needs - non-medical: Not on file  Occupational History  . Not on file  Tobacco Use  . Smoking status: Never Smoker  . Smokeless tobacco: Never Used  Substance and Sexual Activity  . Alcohol use: No    Alcohol/week: 0.0 oz  . Drug use: No  . Sexual  activity: Not on file  Other Topics Concern  . Not on file  Social History Narrative   Worked at [employer] part-time from [phone], 2009-2011. No longer working 2015.Associates degree in early childhood education. Single, lives by self.    Family History:   The patient's family history includes Breast cancer in her sister; Depression (age of onset: 60) in her daughter; Diabetes in her father; Heart attack (age of onset: 64) in her brother; Kidney failure in her brother; Lung cancer in her mother; Osteoarthritis in her maternal grandmother; Pulmonary embolism in her brother. There is no history of Colon cancer, Colon polyps, Esophageal cancer, Rectal cancer, or Stomach cancer.    ROS:  Please see the history of present illness.  Positive for occasional orthopnea, fatigue, shortness of breath with activity, chest pain with activity, weakness, all other ROS reviewed and negative.     Physical Exam/Data:   Vitals:   08/18/17 1605 08/18/17 1621 08/18/17 1800 08/18/17 1900  BP:  (!) 142/50 (!) 139/59 (!) 157/86  Pulse:  90 87 85  Resp:  (!) 22 12 15   Temp:  98 F (36.7 C)    TempSrc:  Oral    SpO2:  100% 96% 94%  Weight: 216 lb (98 kg)     Height: 5\' 2"  (1.575 m)      No intake or output data in the 24 hours ending 08/18/17 1934 Filed Weights   08/18/17 1605  Weight: 216 lb (98 kg)   Body mass index is 39.51 kg/m.  General:  Well nourished, well developed, in no acute distress, obese HEENT: normal Lymph: no adenopathy Neck: no JVD Endocrine:  No thryomegaly Vascular: No carotid bruits; FA pulses 2+ bilaterally without bruits  Cardiac:  normal S1, S2; RRR; no murmur  Lungs:  clear to auscultation bilaterally, no wheezing, rhonchi or rales  Abd: soft, nontender, no hepatomegaly  Ext: Minor lower extremity bilateral edema Musculoskeletal:  No deformities, BUE and BLE strength normal and equal Skin: warm  and dry  Neuro:  CNs 2-12 intact, no focal abnormalities  noted Psych:  Normal affect    EKG:  The ECG that was done  was personally reviewed and demonstrates sinus rhythm with no ischemic changes.  Poor R wave progression.  No significant change from prior ECG.  Relevant CV Studies:  Cardiac catheterization on 08/15/15: Left Heart Cath and Coronary Angiography  Conclusion    Prox RCA lesion, 70% stenosed.  Mid RCA lesion, 30% stenosed.  1st Mrg lesion, 80% stenosed.  Mid LAD lesion, 70% stenosed.  Dist LAD lesion, 70% stenosed.  Dist Cx-1 lesion, 70% stenosed.  Dist Cx-2 lesion, 60% stenosed.  There is mild left ventricular systolic dysfunction.   Low-normal LV dysfunction with an ejection fraction of 50-55% with a small area of the mid inferior mild hypocontractility.  Significant multivessel coronary obstructive disease with evidence for coronary calcification with 70% diffuse proximal to mid LAD stenosis followed by 70% mid-distal LAD stenosis; 75-80% diffuse proximal stenosis in a moderate size OM1 vessel which has several distal bifurcation branches, 70% mid AV groove circumflex and 60% distal circumflex stenosis; and 70% diffuse proximal RCA stenosis with 30% mid stenosis and a small caliber RCA vessel.  RECOMMENDATION: Initial increasing medical regimen will be recommended.  The patient is already being treated with amlodipine and beta blocker therapy; nitrates and Ranexa will be added.  Apparently the patient has not tolerated statins, Zetia will be added for lipid-lowering therapy.  If recurrent increasing symptoms develop despite maximizing medical therapy, with her underlying diabetes mellitus, consider CABG revascularization for long-term benefit.    Diagnostic Diagram      Echocardiogram 01/12/12:  - Left ventricle: The cavity size was normal. Wall thickness was normal. Systolic function was normal. The estimated ejection fraction was in the range of 55% to 60%. Wall motion was normal; there were no  regional wall motion abnormalities. Doppler parameters are consistent with abnormal left ventricular relaxation (grade 1 diastolic dysfunction). - Aortic valve: Trivial regurgitation. - Pericardium, extracardiac: A trivial pericardial effusion was identified.  Lower extremity venous Dopplers 06/09/17:  - No evidence of deep vein thrombosis involving the visualized veins of the left lower extremity.  Laboratory Data:  Chemistry Recent Labs  Lab 08/18/17 1610  NA 139  K 3.4*  CL 104  CO2 27  GLUCOSE 217*  BUN 13  CREATININE 1.08*  CALCIUM 9.6  GFRNONAA 52*  GFRAA >60  ANIONGAP 8    Recent Labs  Lab 08/18/17 1610  PROT 7.2  ALBUMIN 3.7  AST 25  ALT 22  ALKPHOS 81  BILITOT 0.3   Hematology Recent Labs  Lab 08/18/17 1610  WBC 8.3  RBC 4.23  HGB 12.1  HCT 37.6  MCV 88.9  MCH 28.6  MCHC 32.2  RDW 15.4  PLT 241   Cardiac EnzymesNo results for input(s): TROPONINI in the last 168 hours.  Recent Labs  Lab 08/18/17 1623  TROPIPOC 0.00    BNP Recent Labs  Lab 08/18/17 1610  BNP 86.9    DDimer  Recent Labs  Lab 08/18/17 1610  DDIMER 0.78*    Radiology/Studies:  Dg Chest 2 View  Result Date: 08/18/2017 CLINICAL DATA:  Acute chest pain and shortness of breath for 4 days. EXAM: CHEST  2 VIEW COMPARISON:  06/10/2017 CT, 08/14/2015 radiographs and prior studies. FINDINGS: The cardiomediastinal silhouette is unremarkable. Mild peribronchial thickening is unchanged. There is no evidence of focal airspace disease, pulmonary edema, suspicious pulmonary nodule/mass, pleural effusion, or pneumothorax.  No acute bony abnormalities are identified. IMPRESSION: No evidence of acute cardiopulmonary disease. Electronically Signed   By: Margarette Canada M.D.   On: 08/18/2017 16:55   Ct Angio Chest Pe W And/or Wo Contrast  Result Date: 08/18/2017 CLINICAL DATA:  Shoulder pain, chest pain EXAM: CT ANGIOGRAPHY CHEST WITH CONTRAST TECHNIQUE: Multidetector CT imaging  of the chest was performed using the standard protocol during bolus administration of intravenous contrast. Multiplanar CT image reconstructions and MIPs were obtained to evaluate the vascular anatomy. CONTRAST:  137mL ISOVUE-370 IOPAMIDOL (ISOVUE-370) INJECTION 76% COMPARISON:  08/18/2017, 06/10/2017 FINDINGS: Cardiovascular: Satisfactory opacification of the pulmonary arteries to the segmental level. No evidence of pulmonary embolism. Aortic atherosclerosis. No aneurysmal dilatation. Coronary artery calcification. Enlarged pulmonary artery trunk measuring 3.1 cm. Mild cardiomegaly. No large pericardial effusion Mediastinum/Nodes: No enlarged mediastinal, hilar, or axillary lymph nodes. Thyroid gland, trachea, and esophagus demonstrate no significant findings. Lungs/Pleura: Lungs are clear. No pleural effusion or pneumothorax. Upper Abdomen: No acute abnormality. Musculoskeletal: Presumed post lumpectomy changes of the retroareolar left breast, similar compared to prior. No acute or suspicious bone lesion. Degenerative changes Review of the MIP images confirms the above findings. IMPRESSION: 1. Negative for acute pulmonary embolus or aortic dissection 2. Enlarged pulmonary artery trunk, raises possibility for pulmonary hypertension 3. Clear lung fields Aortic Atherosclerosis (ICD10-I70.0). Electronically Signed   By: Donavan Foil M.D.   On: 08/18/2017 19:01    Assessment and Plan:   Unstable angina -Symptoms of increasing frequency and severity of chest discomfort/angina which she has been treated medically for for the past 2 years.  With this intensification of her symptoms, I think that she needs to undergo cardiac catheterization tomorrow.  Risks and benefits of this study including stroke, heart attack, death, renal impairment, bleeding have been discussed.  She is willing to proceed.  After the cardiac catheterization takes place, I would imagine that she would then undergo a surgical consultation given  her already described triple-vessel coronary artery disease.  She seems to be failing medical management with significant antianginals including Imdur and Ranexa. -Repeat echocardiogram.  There may be some evidence of pulmonary hypertension seen on CT scan with enlarged pulmonary trunk.  Certainly with underlying obesity this may be a factor.  Morbid obesity -Body mass index is 39.51 kg/m.  Patient has comorbidities of diabetes as well as hypertension, hyperlipidemia, sleep apnea continue to encourage weight loss.  Continue to follow-up.  Diabetes with hypertension - Continue to monitor.  Last hemoglobin A1c was 9.0 on 06/09/17, uncontrolled. - Have diabetes educator talk to her.  Hyperlipidemia -Continue with statin use, high intensity. -Last LDL 161 on 11/04/16.  Uncontrolled.  Note, she is Jehovah's Witness.  No blood products.  We discussed.  We discussed about the possibility of coronary artery bypass, she was fairly resistant to this idea but she does understand that it can be done without blood products.  First episode to get the coronary angiogram and then make a decision on whether or not this is her best option.  I think would be reasonable to start heparin only if her troponin increases.  I do not want to increase the risk of her bleeding given her religion.  Severity of Illness: The appropriate patient status for this patient is INPATIENT. Inpatient status is judged to be reasonable and necessary in order to provide the required intensity of service to ensure the patient's safety. The patient's presenting symptoms, physical exam findings, and initial radiographic and laboratory data in the context of  their chronic comorbidities is felt to place them at high risk for further clinical deterioration. Furthermore, it is not anticipated that the patient will be medically stable for discharge from the hospital within 2 midnights of admission. The following factors support the patient status of  inpatient.   " The patient's presenting symptoms include worsening angina, unstable angina. " The worrisome physical exam findings include obesity. " The initial radiographic and laboratory data are worrisome because of enlarged pulmonary trunk may be indicative of underlying pulmonary hypertension. " The chronic co-morbidities include morbid obesity, diabetes, sleep apnea.   * I certify that at the point of admission it is my clinical judgment that the patient will require inpatient hospital care spanning beyond 2 midnights from the point of admission due to high intensity of service, high risk for further deterioration and high frequency of surveillance required.*    For questions or updates, please contact [facility] Please consult [email or web address] for contact info under Cardiology/STEMI.    Signed, Candee Furbish, MD  08/18/2017 7:34 PM

## 2017-08-19 ENCOUNTER — Other Ambulatory Visit (HOSPITAL_COMMUNITY): Payer: Medicare Other

## 2017-08-19 ENCOUNTER — Encounter (HOSPITAL_COMMUNITY)
Admission: EM | Disposition: A | Discharge status: Home / Self Care | Payer: Self-pay | Source: Home / Self Care | Attending: Cardiology

## 2017-08-19 DIAGNOSIS — I25110 Atherosclerotic heart disease of native coronary artery with unstable angina pectoris: Secondary | ICD-10-CM

## 2017-08-19 DIAGNOSIS — E785 Hyperlipidemia, unspecified: Secondary | ICD-10-CM

## 2017-08-19 DIAGNOSIS — E119 Type 2 diabetes mellitus without complications: Secondary | ICD-10-CM

## 2017-08-19 DIAGNOSIS — I10 Essential (primary) hypertension: Secondary | ICD-10-CM

## 2017-08-19 HISTORY — PX: ULTRASOUND GUIDANCE FOR VASCULAR ACCESS: SHX6516

## 2017-08-19 HISTORY — PX: LEFT HEART CATH AND CORONARY ANGIOGRAPHY: CATH118249

## 2017-08-19 LAB — PROTIME-INR
INR: 0.99
Prothrombin Time: 13 seconds (ref 11.4–15.2)

## 2017-08-19 LAB — CBC
HCT: 36 % (ref 36.0–46.0)
HEMOGLOBIN: 11.8 g/dL — AB (ref 12.0–15.0)
MCH: 29.1 pg (ref 26.0–34.0)
MCHC: 32.8 g/dL (ref 30.0–36.0)
MCV: 88.7 fL (ref 78.0–100.0)
Platelets: 230 10*3/uL (ref 150–400)
RBC: 4.06 MIL/uL (ref 3.87–5.11)
RDW: 15.6 % — ABNORMAL HIGH (ref 11.5–15.5)
WBC: 6.7 10*3/uL (ref 4.0–10.5)

## 2017-08-19 LAB — GLUCOSE, CAPILLARY
GLUCOSE-CAPILLARY: 134 mg/dL — AB (ref 65–99)
GLUCOSE-CAPILLARY: 170 mg/dL — AB (ref 65–99)
GLUCOSE-CAPILLARY: 224 mg/dL — AB (ref 65–99)
Glucose-Capillary: 118 mg/dL — ABNORMAL HIGH (ref 65–99)

## 2017-08-19 LAB — BASIC METABOLIC PANEL
ANION GAP: 8 (ref 5–15)
BUN: 10 mg/dL (ref 6–20)
CALCIUM: 9.4 mg/dL (ref 8.9–10.3)
CO2: 27 mmol/L (ref 22–32)
Chloride: 104 mmol/L (ref 101–111)
Creatinine, Ser: 0.77 mg/dL (ref 0.44–1.00)
Glucose, Bld: 169 mg/dL — ABNORMAL HIGH (ref 65–99)
Potassium: 3.4 mmol/L — ABNORMAL LOW (ref 3.5–5.1)
Sodium: 139 mmol/L (ref 135–145)

## 2017-08-19 LAB — HEMOGLOBIN A1C
Hgb A1c MFr Bld: 8.8 % — ABNORMAL HIGH (ref 4.8–5.6)
Mean Plasma Glucose: 205.86 mg/dL

## 2017-08-19 LAB — POCT ACTIVATED CLOTTING TIME: Activated Clotting Time: 268 seconds

## 2017-08-19 SURGERY — LEFT HEART CATH AND CORONARY ANGIOGRAPHY
Anesthesia: LOCAL

## 2017-08-19 MED ORDER — ISOSORBIDE MONONITRATE ER 60 MG PO TB24
240.0000 mg | ORAL_TABLET | Freq: Every day | ORAL | Status: DC
  Administered 2017-08-20: 240 mg via ORAL
  Filled 2017-08-19: qty 4

## 2017-08-19 MED ORDER — HEPARIN (PORCINE) IN NACL 2-0.9 UNIT/ML-% IJ SOLN
INTRAMUSCULAR | Status: AC
  Filled 2017-08-19: qty 1000

## 2017-08-19 MED ORDER — SODIUM CHLORIDE 0.9% FLUSH: 3.0000 mL | INTRAVENOUS | Status: DC | PRN

## 2017-08-19 MED ORDER — LIDOCAINE HCL (PF) 1 % IJ SOLN
INTRAMUSCULAR | Status: AC
  Filled 2017-08-19: qty 30

## 2017-08-19 MED ORDER — ADENOSINE 12 MG/4ML IV SOLN
INTRAVENOUS | Status: AC
  Filled 2017-08-19: qty 16

## 2017-08-19 MED ORDER — FENTANYL CITRATE (PF) 100 MCG/2ML IJ SOLN
INTRAMUSCULAR | Status: AC
  Filled 2017-08-19: qty 2

## 2017-08-19 MED ORDER — SODIUM CHLORIDE 0.9% FLUSH
3.0000 mL | Freq: Two times a day (BID) | INTRAVENOUS | Status: DC
  Administered 2017-08-19: 3 mL via INTRAVENOUS

## 2017-08-19 MED ORDER — FENTANYL CITRATE (PF) 100 MCG/2ML IJ SOLN
INTRAMUSCULAR | Status: DC | PRN
  Administered 2017-08-19 (×2): 50 ug via INTRAVENOUS

## 2017-08-19 MED ORDER — VERAPAMIL HCL 2.5 MG/ML IV SOLN
INTRAVENOUS | Status: DC | PRN
  Administered 2017-08-19: 10 mL via INTRA_ARTERIAL

## 2017-08-19 MED ORDER — NITROGLYCERIN 1 MG/10 ML FOR IR/CATH LAB
INTRA_ARTERIAL | Status: DC | PRN
  Administered 2017-08-19: 200 ug via INTRACORONARY

## 2017-08-19 MED ORDER — ASPIRIN 81 MG PO CHEW: 81.0000 mg | CHEWABLE_TABLET | ORAL | Status: DC

## 2017-08-19 MED ORDER — HEPARIN SODIUM (PORCINE) 1000 UNIT/ML IJ SOLN
INTRAMUSCULAR | Status: AC
  Filled 2017-08-19: qty 1

## 2017-08-19 MED ORDER — MIDAZOLAM HCL 2 MG/2ML IJ SOLN
INTRAMUSCULAR | Status: AC
  Filled 2017-08-19: qty 2

## 2017-08-19 MED ORDER — ADENOSINE (DIAGNOSTIC) 140MCG/KG/MIN
INTRAVENOUS | Status: DC | PRN
  Administered 2017-08-19: 140 ug/kg/min via INTRAVENOUS

## 2017-08-19 MED ORDER — HEPARIN (PORCINE) IN NACL 2-0.9 UNIT/ML-% IJ SOLN
INTRAMUSCULAR | Status: AC | PRN
  Administered 2017-08-19: 1000 mL

## 2017-08-19 MED ORDER — SODIUM CHLORIDE 0.9 % WEIGHT BASED INFUSION: 1.0000 mL/kg/h | INTRAVENOUS | Status: DC

## 2017-08-19 MED ORDER — HEPARIN SODIUM (PORCINE) 1000 UNIT/ML IJ SOLN
INTRAMUSCULAR | Status: DC | PRN
  Administered 2017-08-19: 4000 [IU] via INTRAVENOUS
  Administered 2017-08-19: 5000 [IU] via INTRAVENOUS

## 2017-08-19 MED ORDER — SODIUM CHLORIDE 0.9 % IV SOLN: 250.0000 mL | INTRAVENOUS | Status: DC | PRN

## 2017-08-19 MED ORDER — HEPARIN SODIUM (PORCINE) 5000 UNIT/ML IJ SOLN
5000.0000 [IU] | Freq: Three times a day (TID) | INTRAMUSCULAR | Status: DC
  Administered 2017-08-19 – 2017-08-20 (×2): 5000 [IU] via SUBCUTANEOUS
  Filled 2017-08-19 (×2): qty 1

## 2017-08-19 MED ORDER — IOPAMIDOL (ISOVUE-370) INJECTION 76%
INTRAVENOUS | Status: DC | PRN
  Administered 2017-08-19: 150 mL via INTRA_ARTERIAL

## 2017-08-19 MED ORDER — INSULIN ASPART 100 UNIT/ML ~~LOC~~ SOLN
0.0000 [IU] | Freq: Three times a day (TID) | SUBCUTANEOUS | Status: DC
  Administered 2017-08-20: 2 [IU] via SUBCUTANEOUS

## 2017-08-19 MED ORDER — ASPIRIN 81 MG PO CHEW
81.0000 mg | CHEWABLE_TABLET | Freq: Every day | ORAL | Status: DC
  Administered 2017-08-20: 81 mg via ORAL
  Filled 2017-08-19: qty 1

## 2017-08-19 MED ORDER — SODIUM CHLORIDE 0.9% FLUSH
3.0000 mL | Freq: Two times a day (BID) | INTRAVENOUS | Status: DC
  Administered 2017-08-20: 3 mL via INTRAVENOUS

## 2017-08-19 MED ORDER — HEPARIN SODIUM (PORCINE) 5000 UNIT/ML IJ SOLN: 5000.0000 [IU] | Freq: Three times a day (TID) | INTRAMUSCULAR | Status: DC

## 2017-08-19 MED ORDER — ONDANSETRON HCL 4 MG/2ML IJ SOLN: 4.0000 mg | Freq: Four times a day (QID) | INTRAMUSCULAR | Status: DC | PRN

## 2017-08-19 MED ORDER — LIDOCAINE HCL (PF) 1 % IJ SOLN
INTRAMUSCULAR | Status: DC | PRN
  Administered 2017-08-19: 2 mL via INTRADERMAL

## 2017-08-19 MED ORDER — IOPAMIDOL (ISOVUE-370) INJECTION 76%
INTRAVENOUS | Status: AC
  Filled 2017-08-19: qty 50

## 2017-08-19 MED ORDER — NITROGLYCERIN 1 MG/10 ML FOR IR/CATH LAB
INTRA_ARTERIAL | Status: AC
  Filled 2017-08-19: qty 10

## 2017-08-19 MED ORDER — IOPAMIDOL (ISOVUE-370) INJECTION 76%
INTRAVENOUS | Status: AC
  Filled 2017-08-19: qty 100

## 2017-08-19 MED ORDER — SODIUM CHLORIDE 0.9 % WEIGHT BASED INFUSION: 3.0000 mL/kg/h | INTRAVENOUS | Status: DC

## 2017-08-19 MED ORDER — MIDAZOLAM HCL 2 MG/2ML IJ SOLN
INTRAMUSCULAR | Status: DC | PRN
  Administered 2017-08-19 (×2): 1 mg via INTRAVENOUS

## 2017-08-19 MED ORDER — ACETAMINOPHEN 325 MG PO TABS: 650.0000 mg | ORAL_TABLET | ORAL | Status: DC | PRN

## 2017-08-19 MED ORDER — VERAPAMIL HCL 2.5 MG/ML IV SOLN
INTRAVENOUS | Status: AC
  Filled 2017-08-19: qty 2

## 2017-08-19 MED ORDER — AMLODIPINE BESYLATE 10 MG PO TABS
10.0000 mg | ORAL_TABLET | ORAL | Status: DC
  Administered 2017-08-19: 10 mg via ORAL
  Filled 2017-08-19: qty 1

## 2017-08-19 MED ORDER — SODIUM CHLORIDE 0.9 % IV SOLN
INTRAVENOUS | Status: AC
  Administered 2017-08-19: 15:00:00 via INTRAVENOUS

## 2017-08-19 SURGICAL SUPPLY — 15 items
CATH INFINITI 5 FR JL3.5 (CATHETERS) ×1 IMPLANT
CATH INFINITI JR4 5F (CATHETERS) ×1 IMPLANT
CATH VISTA GUIDE 6FR XBLAD3.0 (CATHETERS) ×1 IMPLANT
COVER PRB 48X5XTLSCP FOLD TPE (BAG) IMPLANT
COVER PROBE 5X48 (BAG) ×3
DEVICE RAD COMP TR BAND LRG (VASCULAR PRODUCTS) ×1 IMPLANT
GLIDESHEATH SLEND A-KIT 6F 22G (SHEATH) ×1 IMPLANT
GUIDEWIRE INQWIRE 1.5J.035X260 (WIRE) IMPLANT
GUIDEWIRE PRESSURE COMET II (WIRE) ×1 IMPLANT
INQWIRE 1.5J .035X260CM (WIRE) ×3
KIT ESSENTIALS PG (KITS) ×1 IMPLANT
KIT HEART LEFT (KITS) ×3 IMPLANT
PACK CARDIAC CATHETERIZATION (CUSTOM PROCEDURE TRAY) ×3 IMPLANT
TRANSDUCER W/STOPCOCK (MISCELLANEOUS) ×3 IMPLANT
TUBING CIL FLEX 10 FLL-RA (TUBING) ×3 IMPLANT

## 2017-08-19 NOTE — Progress Notes (Signed)
Patient refused bed alarm. Will continue to monitor patient. 

## 2017-08-19 NOTE — Progress Notes (Signed)
NURSING PROGRESS NOTE  [name] [id number] Admission Data: 08/19/2017 12:02 AM Attending Provider: Jerline Pain, MD QRF:[id number], Real Cons, MD Code Status: Full Jehovah Witness.Marland KitchenMarland KitchenDoes not accept blood products   [name] is a 66 y.o. female patient admitted from ED:  -No acute distress noted.  -No complaints of shortness of breath.  -No complaints of chest pain.   Cardiac Monitoring: Box # 32 in place. Cardiac monitor yields:normal sinus rhythm.  Blood pressure (!) 163/86, pulse 73, temperature 98.2 F (36.8 C), temperature source Oral, resp. rate 16, height 5\' 1"  (1.549 m), weight 98.3 kg (216 lb 12.8 oz), last menstrual period 07/12/1984, SpO2 99 %.   IV Fluids:  IV in place, occlusive dsg intact without redness, IV cath antecubital right, condition patent and no redness none.   Allergies:  Lisinopril; Oxycodone-acetaminophen; Victoza [liraglutide]; and Atorvastatin  Past Medical History:   has a past medical history of Arthritis, Breast cancer ([place]) (2006), CAD (coronary artery disease) (11/07), Cataract, Chronic lower back pain, Chronic pain, Chronic venous insufficiency (2012), Colonic polyp (1995), Diabetes mellitus type II, Diastolic CHF, chronic ([place]), Essential hypertension, GERD (gastroesophageal reflux disease), Heart murmur, Hyperlipidemia, Iron deficiency anemia, Obesity, OSA (obstructive sleep apnea) (02/16/2007), OSA on CPAP, Personal history of radiation therapy, Refusal of blood transfusions as patient is Jehovah's Witness, and Seasonal allergies.  Past Surgical History:   has a past surgical history that includes Breast lumpectomy w/ needle localization (2006); Tonsillectomy and adenoidectomy (1964); Breast lumpectomy (Left, 2006); Total abdominal hysterectomy (1985); Colonoscopy w/ biopsies and polypectomy (1994, 2000, 2012, 26/018); and Cardiac catheterization (N/A, 08/15/2015).  Social History:   reports that  has never smoked. she has never used  smokeless tobacco. She reports that she does not drink alcohol or use drugs.  Skin: Clean, dry, and intact  Patient/Family orientated to room. Information packet given to patient/family. Admission inpatient armband information verified with patient/family to include name and date of birth and placed on patient arm. Side rails up x 2, fall assessment and education completed with patient/family. Patient/family able to verbalize understanding of risk associated with falls and verbalized understanding to call for assistance before getting out of bed. Call light within reach. Patient/family able to voice and demonstrate understanding of unit orientation instructions.    Will continue to evaluate and treat per MD orders.

## 2017-08-19 NOTE — Progress Notes (Signed)
Progress Note  Patient Name: [name] Date of Encounter: 08/19/2017  Primary Cardiologist: Dr. Percival Spanish  Subjective   Pt states she is feeling much better today, no chest pain. She is ready to go to the cath lab but states she will not have surgery.  Inpatient Medications    Scheduled Meds: . amLODipine  10 mg Oral Daily  . [START ON 08/20/2017] aspirin  81 mg Oral Pre-Cath  . aspirin EC  81 mg Oral Daily  . ezetimibe  10 mg Oral Daily  . heparin subcutaneous  5,000 Units Subcutaneous Q8H  . isosorbide mononitrate  60 mg Oral Daily  . metoprolol succinate  100 mg Oral Daily  . sodium chloride flush  3 mL Intravenous Q12H   Continuous Infusions: . sodium chloride    . [START ON 08/20/2017] sodium chloride     Followed by  . [START ON 08/20/2017] sodium chloride     PRN Meds: sodium chloride, nitroGLYCERIN, sodium chloride flush   Vital Signs    Vitals:   08/18/17 2208 08/19/17 0043 08/19/17 0500 08/19/17 0714  BP: (!) 163/86 (!) 154/79 (!) 164/84 (!) 167/87  Pulse: 73 71 91 79  Resp: 16 18 18    Temp: 98.2 F (36.8 C) 98.3 F (36.8 C) 98.4 F (36.9 C) 98.4 F (36.9 C)  TempSrc: Oral Oral Oral Oral  SpO2: 99% 100% 100% 99%  Weight: 216 lb 12.8 oz (98.3 kg)  216 lb (98 kg)   Height: 5\' 1"  (1.549 m)       Intake/Output Summary (Last 24 hours) at 08/19/2017 0913 Last data filed at 08/19/2017 0700 Gross per 24 hour  Intake -  Output 1400 ml  Net -1400 ml   Filed Weights   08/18/17 1605 08/18/17 2208 08/19/17 0500  Weight: 216 lb (98 kg) 216 lb 12.8 oz (98.3 kg) 216 lb (98 kg)     Physical Exam   General: Well developed, well nourished, female appearing in no acute distress. Head: Normocephalic, atraumatic.  Neck: Supple without bruits, JVD Lungs:  Resp regular and unlabored, CTA. Heart: RRR, S1, S2, no S3, S4, or murmur; no rub. Abdomen: Soft, non-tender, non-distended with normoactive bowel sounds. No hepatomegaly. No rebound/guarding. No  obvious abdominal masses. Extremities: No clubbing, cyanosis, trace edema, compression hose in place. Distal pedal pulses are 1+ bilaterally. Neuro: Alert and oriented X 3. Moves all extremities spontaneously. Psych: Normal affect.  Labs    Chemistry Recent Labs  Lab 08/18/17 1610 08/19/17 0334  NA 139 139  K 3.4* 3.4*  CL 104 104  CO2 27 27  GLUCOSE 217* 169*  BUN 13 10  CREATININE 1.08* 0.77  CALCIUM 9.6 9.4  PROT 7.2  --   ALBUMIN 3.7  --   AST 25  --   ALT 22  --   ALKPHOS 81  --   BILITOT 0.3  --   GFRNONAA 52* >60  GFRAA >60 >60  ANIONGAP 8 8     Hematology Recent Labs  Lab 08/18/17 1610 08/19/17 0334  WBC 8.3 6.7  RBC 4.23 4.06  HGB 12.1 11.8*  HCT 37.6 36.0  MCV 88.9 88.7  MCH 28.6 29.1  MCHC 32.2 32.8  RDW 15.4 15.6*  PLT 241 230    Cardiac EnzymesNo results for input(s): TROPONINI in the last 168 hours.  Recent Labs  Lab 08/18/17 1623  TROPIPOC 0.00     BNP Recent Labs  Lab 08/18/17 1610  BNP 86.9  DDimer  Recent Labs  Lab 08/18/17 1610  DDIMER 0.78*     Radiology    Dg Chest 2 View  Result Date: 08/18/2017 CLINICAL DATA:  Acute chest pain and shortness of breath for 4 days. EXAM: CHEST  2 VIEW COMPARISON:  06/10/2017 CT, 08/14/2015 radiographs and prior studies. FINDINGS: The cardiomediastinal silhouette is unremarkable. Mild peribronchial thickening is unchanged. There is no evidence of focal airspace disease, pulmonary edema, suspicious pulmonary nodule/mass, pleural effusion, or pneumothorax. No acute bony abnormalities are identified. IMPRESSION: No evidence of acute cardiopulmonary disease. Electronically Signed   By: Margarette Canada M.D.   On: 08/18/2017 16:55   Ct Angio Chest Pe W And/or Wo Contrast  Result Date: 08/18/2017 CLINICAL DATA:  Shoulder pain, chest pain EXAM: CT ANGIOGRAPHY CHEST WITH CONTRAST TECHNIQUE: Multidetector CT imaging of the chest was performed using the standard protocol during bolus administration  of intravenous contrast. Multiplanar CT image reconstructions and MIPs were obtained to evaluate the vascular anatomy. CONTRAST:  156mL ISOVUE-370 IOPAMIDOL (ISOVUE-370) INJECTION 76% COMPARISON:  08/18/2017, 06/10/2017 FINDINGS: Cardiovascular: Satisfactory opacification of the pulmonary arteries to the segmental level. No evidence of pulmonary embolism. Aortic atherosclerosis. No aneurysmal dilatation. Coronary artery calcification. Enlarged pulmonary artery trunk measuring 3.1 cm. Mild cardiomegaly. No large pericardial effusion Mediastinum/Nodes: No enlarged mediastinal, hilar, or axillary lymph nodes. Thyroid gland, trachea, and esophagus demonstrate no significant findings. Lungs/Pleura: Lungs are clear. No pleural effusion or pneumothorax. Upper Abdomen: No acute abnormality. Musculoskeletal: Presumed post lumpectomy changes of the retroareolar left breast, similar compared to prior. No acute or suspicious bone lesion. Degenerative changes Review of the MIP images confirms the above findings. IMPRESSION: 1. Negative for acute pulmonary embolus or aortic dissection 2. Enlarged pulmonary artery trunk, raises possibility for pulmonary hypertension 3. Clear lung fields Aortic Atherosclerosis (ICD10-I70.0). Electronically Signed   By: Donavan Foil M.D.   On: 08/18/2017 19:01     Telemetry    Sinus rhythm - Personally Reviewed  ECG    No new tracings - Personally Reviewed  Cardiac Studies   Cardiac catheterization on 08/15/15: Left Heart Cath and Coronary Angiography  Conclusion    Prox RCA lesion, 70% stenosed.  Mid RCA lesion, 30% stenosed.  1st Mrg lesion, 80% stenosed.  Mid LAD lesion, 70% stenosed.  Dist LAD lesion, 70% stenosed.  Dist Cx-1 lesion, 70% stenosed.  Dist Cx-2 lesion, 60% stenosed.  There is mild left ventricular systolic dysfunction.  Low-normal LV dysfunction with an ejection fraction of 50-55% with a small area of the mid inferior mild  hypocontractility.  Significant multivessel coronary obstructive disease with evidence for coronary calcification with 70% diffuse proximal to mid LAD stenosis followed by 70% mid-distal LAD stenosis; 75-80% diffuse proximal stenosis in a moderate size OM1 vessel which has several distal bifurcation branches, 70% mid AV groove circumflex and 60% distal circumflex stenosis; and 70% diffuse proximal RCA stenosis with 30% mid stenosis and a small caliber RCA vessel.  RECOMMENDATION: Initial increasing medical regimen will be recommended. The patient is already being treated with amlodipine and beta blocker therapy; nitrates and Ranexa will be added. Apparently the patient has not tolerated statins, Zetia will be added for lipid-lowering therapy. If recurrent increasing symptoms develop despite maximizing medical therapy, with her underlying diabetes mellitus, consider CABG revascularization for long-term benefit.    Diagnostic Diagram       Echocardiogram 01/12/12: - Left ventricle: The cavity size was normal. Wall thickness was normal. Systolic function was normal. The estimated ejection fraction was in  the range of 55% to 60%. Wall motion was normal; there were no regional wall motion abnormalities. Doppler parameters are consistent with abnormal left ventricular relaxation (grade 1 diastolic dysfunction). - Aortic valve: Trivial regurgitation. - Pericardium, extracardiac: A trivial pericardial effusion was identified.   Lower extremity venous Dopplers 06/09/17: - No evidence of deep vein thrombosis involving the visualized veins of the left lower extremity.   Patient Profile     66 y.o. female with a history of severe triple-vessel coronary artery disease managed medically with a pattern of chronic stable angina   Assessment & Plan    1. Chest pain, hx of CAD, s/p LHC 2016 with multivessel disease without intervention - pt has been medically optimized, but  was admitted with worsening angina - she is scheduled for cath lab this afternoon  - repeat echocardiogram to evaluate pulmonary hypertension seen on CT scan - Jehovah's witness - heparin drip not started, she is chest pain free and troponin x 1 negative  2. Morbid obesity - encourage weight loss  3. DM with HTN - continue to monitor - last A1c 9.0 06/09/17 - I placed consult for diabetes educator  4. HLD - continue high intensity statin - LDL on 11/04/16 161 -  Consider repatha OP   Signed, Ledora Bottcher , PA-C 9:13 AM 08/19/2017 Pager: [phone]  History and all data above reviewed.  Patient examined.  I agree with the findings as above.   She is currently pain free post cath.  Results are as recorded elsewhere.  she has diffuse disease not amenable to PCI without a high probability of recurrent pain and need for repeat interventions.  She is considering her options as Dr. Tamala Julian mentioned. The patient exam reveals COR:RRR  ,  Lungs: .clear  ,  Abd: Positive bowel sounds, no rebound no guarding, Ext No edema, right wrist band in place  .  All available labs, radiology testing, previous records reviewed. Agree with documented assessment and plan. UNSTABLE ANGINA.  Her home meds are somewhat confusing in that she is on both NTG patch and Imdur.  I think we should maximize the Imdur (I will write for the max dose 240 mg) and it also looks like we could consider increasing the beta blocker before she is discharged possibly in the AM.  She says that she had been getting along fairly well prior to the presentation yesterday.  Jeneen Rinks Jaydan Meidinger  3:31 PM  08/19/2017

## 2017-08-19 NOTE — Progress Notes (Signed)
Inpatient Diabetes Program Recommendations  AACE/ADA: New Consensus Statement on Inpatient Glycemic Control (2015)  Target Ranges:  Prepandial:   less than 140 mg/dL      Peak postprandial:   less than 180 mg/dL (1-2 hours)      Critically ill patients:  140 - 180 mg/dL  Results for PARA, COSSEY (MRN [id number]) as of 08/19/2017 09:55  Ref. Range 08/18/2017 22:11 08/19/2017 07:40  Glucose-Capillary Latest Ref Range: 65 - 99 mg/dL 132 (H) 134 (H)  Results for RUBINA, BASINSKI (MRN [id number]) as of 08/19/2017 09:55  Ref. Range 08/18/2017 16:10 08/19/2017 03:34  Glucose Latest Ref Range: 65 - 99 mg/dL 217 (H) 169 (H)   Results for EMILLIE, CHASEN (MRN [id number]) as of 08/19/2017 09:55  Ref. Range 06/09/2017 09:31  Hemoglobin A1C Unknown 9.0   Review of Glycemic Control  Diabetes history: DM2 Outpatient Diabetes medications: Toujeo 35 units daily, Prandin 2 mg TID, Metformin 1000 mg BID Current orders for Inpatient glycemic control: NONE  Inpatient Diabetes Program Recommendations: Insulin - Basal: If glucose is consistently over 180 mg/dl after Novolog correction is started, please consider ordering Lantus 10 units Q24H (based on 98 kg x 0.1 units). Correction (SSI): Please consider ordering Novolog 0-9 units Q4H. HgbA1C: Noted last A1C 9.0% on 06/09/17 indicating an average glucose of 212 mg/dl. Patient was started on Prandin 2 mg TID on 06/09/17 due to increase in A1C and elevated post prandial glucose. Please order a current A1C.  NOTE: Chart reviewed. Noted patient seen Dr. Lynnae January on 06/09/17 and Prandin 2 mg TID was added to DM medication regimen due to elevated post prandial glucose and increase in A1C. Also noted patient seen [name], RD for DM education on 06/23/17 and patient was suppose to attend outpatient group DM education classes as well. Patient seen Dr. Amalia Hailey on 08/08/17 and was given steroid injection in right ankle and right tibial tendon. Recent steroid injections likely  to impact current A1C results. Spoke with patient over phone and patient confirms outpatient DM medications as noted in chart. Patient reports that since she has started on Prandin, her glucose has improved. Patient states that she checks her glucose 3-4 times per day and it is usually 100-150 mg/dl and she rarely has any hypoglycemia. Patient states that she was on 70/30 insulin and was changed to [facility] about 3-4 months ago due to frequent hypoglycemia. Patient reports that when her glucose gets to around 70 mg/dl she has hypoglycemia symptoms. Encouraged patient to be sure to check her glucose any time she has symptoms of hypoglycemia so that she can discuss with her PCP and [name], RD. Patient states that she talked with [name] over the phone this past Wednesday and [name] signed her up for outpatient DM education group classes scheduled for December 20th. Informed patient of recommendation to use Novolog correction insulin now Q4H while NPO and to add back low dose basal insulin if CBGs consistently greater than 180 mg/dl. Encouraged patient to continue taking DM medication and checking glucose as prescribed and to keep in contact with PCP and [name], RD regardign DM control. Patient verbalized understanding of information discussed and she states she has no questions at this time related to DM.  Thanks, Barnie Alderman, RN, MSN, CDE Diabetes Coordinator Inpatient Diabetes Program [phone] (Team Pager from 8am to 5pm)

## 2017-08-19 NOTE — Progress Notes (Signed)
Day of Surgery Procedure(s) (LRB): LEFT HEART CATH AND CORONARY ANGIOGRAPHY (N/A) Ultrasound Guidance For Vascular Access Subjective: Patient examined, coronary angiogram images from today personally reviewed and discussed with patient. Very nice 66 year old morbidly obese female with hypertension, diabetes, and pulmonary hypertension admitted with symptoms of worsening angina and negative enzymes.  Cardiac catheterization demonstrates three-vessel disease with a very small RCA nongraftable and moderate to severe disease of the LAD and circumflex vessels.  With the patient's comorbidities it would not be safe to perform CABG without the ability to provide blood products.  The patient refuses to consider surgery under those conditions.   Objective: Vital signs in last 24 hours: Temp:  [98.2 F (36.8 C)-98.7 F (37.1 C)] 98.7 F (37.1 C) (11/16 2022) Pulse Rate:  [0-107] 73 (11/16 2022) Cardiac Rhythm: Heart block;Normal sinus rhythm (11/16 1901) Resp:  [0-32] 18 (11/16 2022) BP: (129-184)/(62-106) 155/67 (11/16 2022) SpO2:  [0 %-100 %] 100 % (11/16 2022) Weight:  [216 lb (98 kg)-216 lb 12.8 oz (98.3 kg)] 216 lb (98 kg) (11/16 0500)  Hemodynamic parameters for last 24 hours:  Stable  Intake/Output from previous day: 11/15 0701 - 11/16 0700 In: -  Out: 1400 [Urine:1400] Intake/Output this shift: Total I/O In: -  Out: 300 [Urine:300]  Exam Morbidly obese Breath sounds clear Heart rate regular Neuro intact  Lab Results: Recent Labs    08/18/17 1610 08/19/17 0334  WBC 8.3 6.7  HGB 12.1 11.8*  HCT 37.6 36.0  PLT 241 230   BMET:  Recent Labs    08/18/17 1610 08/19/17 0334  NA 139 139  K 3.4* 3.4*  CL 104 104  CO2 27 27  GLUCOSE 217* 169*  BUN 13 10  CREATININE 1.08* 0.77  CALCIUM 9.6 9.4    PT/INR:  Recent Labs    08/19/17 0334  LABPROT 13.0  INR 0.99   ABG    Component Value Date/Time   TCO2 24 10/28/2010 1753   CBG (last 3)  Recent Labs   08/19/17 1148 08/19/17 1612 08/19/17 2036  GLUCAP 170* 118* 224*    Assessment/Plan: S/P Procedure(s) (LRB): LEFT HEART CATH AND CORONARY ANGIOGRAPHY (N/A) Ultrasound Guidance For Vascular Access Patient not a candidate for CABG without the ability to provide blood product transfusion therapy.  Recommend medical therapy.   LOS: 1 day    Tharon Aquas Trigt III 08/19/2017

## 2017-08-19 NOTE — Progress Notes (Signed)
Cath lab called stated on way to pick up pt  Cath lab and MD aware pt ate a full breakfast tray and NPO since 1000 MD and cath lab stated it was okay

## 2017-08-20 ENCOUNTER — Inpatient Hospital Stay (HOSPITAL_COMMUNITY): Payer: Medicare Other

## 2017-08-20 ENCOUNTER — Other Ambulatory Visit: Payer: Self-pay | Admitting: Adult Health

## 2017-08-20 DIAGNOSIS — I351 Nonrheumatic aortic (valve) insufficiency: Secondary | ICD-10-CM

## 2017-08-20 DIAGNOSIS — I25118 Atherosclerotic heart disease of native coronary artery with other forms of angina pectoris: Secondary | ICD-10-CM

## 2017-08-20 DIAGNOSIS — Z9189 Other specified personal risk factors, not elsewhere classified: Secondary | ICD-10-CM

## 2017-08-20 DIAGNOSIS — G4733 Obstructive sleep apnea (adult) (pediatric): Secondary | ICD-10-CM

## 2017-08-20 DIAGNOSIS — I340 Nonrheumatic mitral (valve) insufficiency: Secondary | ICD-10-CM

## 2017-08-20 LAB — ECHOCARDIOGRAM COMPLETE
HEIGHTINCHES: 61 in
WEIGHTICAEL: 3441.6 [oz_av]

## 2017-08-20 LAB — GLUCOSE, CAPILLARY
GLUCOSE-CAPILLARY: 155 mg/dL — AB (ref 65–99)
GLUCOSE-CAPILLARY: 183 mg/dL — AB (ref 65–99)

## 2017-08-20 MED ORDER — POTASSIUM CHLORIDE CRYS ER 20 MEQ PO TBCR
40.0000 meq | EXTENDED_RELEASE_TABLET | Freq: Once | ORAL | Status: AC
  Administered 2017-08-20: 40 meq via ORAL
  Filled 2017-08-20: qty 2

## 2017-08-20 MED ORDER — LOSARTAN POTASSIUM 25 MG PO TABS
25.0000 mg | ORAL_TABLET | Freq: Every day | ORAL | Status: DC
  Administered 2017-08-20: 25 mg via ORAL
  Filled 2017-08-20: qty 1

## 2017-08-20 MED ORDER — ISOSORBIDE MONONITRATE ER 120 MG PO TB24: 240.0000 mg | ORAL_TABLET | Freq: Every day | ORAL | 6 refills | Status: DC

## 2017-08-20 MED ORDER — POTASSIUM CHLORIDE CRYS ER 20 MEQ PO TBCR: 40.0000 meq | EXTENDED_RELEASE_TABLET | Freq: Once | ORAL | Status: DC

## 2017-08-20 MED ORDER — LOSARTAN POTASSIUM 50 MG PO TABS: 50.0000 mg | ORAL_TABLET | Freq: Every day | ORAL | Status: DC

## 2017-08-20 MED ORDER — ISOSORBIDE MONONITRATE ER 120 MG PO TB24: 120.0000 mg | ORAL_TABLET | ORAL | 11 refills | Status: DC

## 2017-08-20 MED ORDER — CHLORTHALIDONE 25 MG PO TABS
25.0000 mg | ORAL_TABLET | Freq: Every day | ORAL | Status: DC
  Filled 2017-08-20: qty 1

## 2017-08-20 MED ORDER — SPIRONOLACTONE 25 MG PO TABS
25.0000 mg | ORAL_TABLET | Freq: Every day | ORAL | Status: DC
  Administered 2017-08-20: 25 mg via ORAL
  Filled 2017-08-20: qty 1

## 2017-08-20 MED ORDER — ISOSORBIDE MONONITRATE ER 120 MG PO TB24: 240.0000 mg | ORAL_TABLET | ORAL | 11 refills | Status: DC

## 2017-08-20 MED ORDER — ISOSORBIDE MONONITRATE ER 120 MG PO TB24
120.0000 mg | ORAL_TABLET | ORAL | 11 refills | Status: DC
Start: 2017-08-20 — End: 2017-08-20

## 2017-08-20 MED ORDER — SPIRONOLACTONE 25 MG PO TABS: 25.0000 mg | ORAL_TABLET | Freq: Every day | ORAL | 6 refills | Status: DC

## 2017-08-20 MED ORDER — POTASSIUM CHLORIDE CRYS ER 20 MEQ PO TBCR: 20.0000 meq | EXTENDED_RELEASE_TABLET | Freq: Every day | ORAL | Status: DC

## 2017-08-20 NOTE — Progress Notes (Signed)
Inpatient Diabetes Program Recommendations  AACE/ADA: New Consensus Statement on Inpatient Glycemic Control (2015)  Target Ranges:  Prepandial:   less than 140 mg/dL      Peak postprandial:   less than 180 mg/dL (1-2 hours)      Critically ill patients:  140 - 180 mg/dL   Results for HIMANI, CORONA (MRN [id number]) as of 08/20/2017 07:21  Ref. Range 08/19/2017 07:40 08/19/2017 11:48 08/19/2017 16:12 08/19/2017 20:36  Glucose-Capillary Latest Ref Range: 65 - 99 mg/dL 134 (H) 170 (H) 118 (H) 224 (H)    Review of Glycemic Control  Diabetes history: DM2 Outpatient Diabetes medications: Toujeo 35 units daily, Prandin 2 mg TID, Metformin 1000 mg BID Current orders for Inpatient glycemic control: Novolog 0-9 units TID with meals  Inpatient Diabetes Program Recommendations:   Correction (SSI): In reviewing chart, patient did NOT receive any insulin on 08/19/17 (was in cath lab when noon dose of Novolog ordered and CBG was 118 mg/dl at supper so patient did not require any coverage). Please consider adding Novolog 0-5 units QHS for bedtime correction scale.  Insulin - Basal: If glucose is consistently over 180 mg/dl after Novolog correction is started, please consider ordering Lantus 10 units Q24H (based on 98 kg x 0.1 units).  HgbA1C: Noted last A1C 9.0% on 06/09/17 indicating an average glucose of 212 mg/dl. Patient was started on Prandin 2 mg TID on 06/09/17 due to increase in A1C and elevated post prandial glucose. Please order a current A1C.  Thanks, Barnie Alderman, RN, MSN, CDE Diabetes Coordinator Inpatient Diabetes Program [phone] (Team Pager from 8am to 5pm)

## 2017-08-20 NOTE — Plan of Care (Signed)
  Education: Knowledge of General Education information will improve 08/20/2017 0757 - Completed/Met by Evert Kohl, RN   Education: Understanding of CV disease, CV risk reduction, and recovery process will improve 08/20/2017 0757 - Completed/Met by Evert Kohl, RN   Cardiovascular: Ability to achieve and maintain adequate cardiovascular perfusion will improve 08/20/2017 0757 - Completed/Met by Evert Kohl, RN

## 2017-08-20 NOTE — Discharge Summary (Signed)
Discharge Summary    Patient ID: [name],  MRN: [id number], DOB/AGE: 66/66/1952 66 y.o.  Admit date: 08/18/2017 Discharge date: 08/20/2017  Primary Care Provider: Larey Dresser A Primary Cardiologist: Minus Breeding, MD  Discharge Diagnoses    Principal Problem:   Unstable angina [facility]) Active Problems:   CAD (coronary artery disease)   Benign essential HTN   OSA (obstructive sleep apnea)   At high risk for venous thromboembolism (VTE)   Allergies Allergies  Allergen Reactions  . Lisinopril Other (See Comments)    Chronic cough secondary to ACE  . Oxycodone-Acetaminophen Hives and Itching    flushing  . Victoza [Liraglutide]     Made her feel "out of it"  . Atorvastatin Other (See Comments)    myalgias    Diagnostic Studies/Procedures    Cardiac Catheterization Conclusion    Severe multivessel coronary disease including a segmental proximal to mid 70-80 80% LAD that was proven hemodynamically significant by FFR of 0.64.  90-95% obstruction in the first obtuse marginal, proximal circumflex 80% stenosis, mid circumflex 90% stenosis, circumflex branches beyond the first obtuse marginal quite small and not graftable.  Small 70-80% ramus intermedius  40% mid body left main stenosis.  Catheter damping with engagement using the 6 [place] guide catheter.  Nondominant right coronary with proximal 80-90% obstruction.  Normal left ventricular function with EF estimated at 65%.  Normal left ventricular filling pressures.  RECOMMENDATIONS:   Uptitrate medical therapy with additional long-acting nitrate, and beta-blocker.  Recommend the patient consider coronary artery bypass grafting given diabetes and multifocal nature of disease.  She has some reservations and wants to know more about alternative treatment strategies but wants to learn about bypass surgery in her particular situation.  Percutaneous options exist in the first obtuse marginal which is  the likely culprit in the mid LAD.  Briefly discussed percutaneous option and incomplete nature of revascularization.    _____________   History of Present Illness     [name] is a 66 year old obese female who was admitted with unstable angina.  Patient has a history of hypertension, diabetes type II, OSA, upper lipidemia, iron deficiency anemia, intolerant to ACE inhibitors, and statin therapy.  She presented to the emergency room with symptoms of worsening chest discomfort 10/10 with ambulation and worsening dyspnea on exertion ambulating less than 300 feet.  Describing the pain is sharp.  She awakes in the middle the night short of breath.  Hospital Course     Consultants: CVTS-[facility]   [name] was admitted with unstable angina.  The patient was planned for cardiac catheterization, which was completed on 08/19/2017.  This revealed severe multivessel coronary artery disease including a segmental portion of the mid LAD of 70-80%, with FFR of 0.64; further she had a 90-95% obstruction of the first obtuse diagonal, proximal circumflex, ramus intermedius, 40% left main stenosis, non-dominant right coronary artery with proximal 80-90% obstruction.  The patient was not found to be a candidate for PCI intervention at that time.  CVTS was consulted for discussion and candidacy for CABG. due to patient's religious believes she refuses blood transfusion.  After discussion with Dr. Nils Pyle, the patient was not deemed to be a candidate for CABG.  He recommended maximizing medical treatment.  The day of discharge, the patient was without complaints of chest pain.  She was found to be hypertensive and hypokalemic.  She also complained of some lower extremity edema.  During hospitalization Dr. Percival Spanish increased her  isosorbide from 120 mg daily to 240 mg daily.  Patient was not wearing CPAP throughout hospitalization.  After evaluation, the patient was placed on spironolactone 25 mg daily, he was  given 1 dose of 40 mEq of potassium x1.  She will need a follow-up BMET on posthospitalization to evaluate potassium status and need for ongoing supplement.  The patient was also started on ARB for hypertension.  This was discussed with her during rounds by Dr. Meda Coffee.  The patient verbalized understanding.  She was found to be stable for discharge.  She will have follow-up appointment at our [facility] office for ongoing management and recommendations _____________  Discharge Vitals Blood pressure (!) 191/96, pulse 72, temperature 98.4 F (36.9 C), temperature source Oral, resp. rate 20, height 5\' 1"  (1.549 m), weight 215 lb 1.6 oz (97.6 kg), last menstrual period 07/12/1984, SpO2 99 %.  Filed Weights   08/18/17 2208 08/19/17 0500 08/20/17 0417  Weight: 216 lb 12.8 oz (98.3 kg) 216 lb (98 kg) 215 lb 1.6 oz (97.6 kg)    Labs & Radiologic Studies     CBC Recent Labs    08/18/17 1610 08/19/17 0334  WBC 8.3 6.7  HGB 12.1 11.8*  HCT 37.6 36.0  MCV 88.9 88.7  PLT 241 626   Basic Metabolic Panel Recent Labs    08/18/17 1610 08/19/17 0334  NA 139 139  K 3.4* 3.4*  CL 104 104  CO2 27 27  GLUCOSE 217* 169*  BUN 13 10  CREATININE 1.08* 0.77  CALCIUM 9.6 9.4   Liver Function Tests Recent Labs    08/18/17 1610  AST 25  ALT 22  ALKPHOS 81  BILITOT 0.3  PROT 7.2  ALBUMIN 3.7   D-Dimer Recent Labs    08/18/17 1610  DDIMER 0.78*   Hemoglobin A1C Recent Labs    08/19/17 0334  HGBA1C 8.8*     Dg Chest 2 View  Result Date: 08/18/2017 CLINICAL DATA:  Acute chest pain and shortness of breath for 4 days. EXAM: CHEST  2 VIEW COMPARISON:  06/10/2017 CT, 08/14/2015 radiographs and prior studies. FINDINGS: The cardiomediastinal silhouette is unremarkable. Mild peribronchial thickening is unchanged. There is no evidence of focal airspace disease, pulmonary edema, suspicious pulmonary nodule/mass, pleural effusion, or pneumothorax. No acute bony abnormalities are identified.  IMPRESSION: No evidence of acute cardiopulmonary disease. Electronically Signed   By: Margarette Canada M.D.   On: 08/18/2017 16:55   Ct Angio Chest Pe W And/or Wo Contrast  Result Date: 08/18/2017 CLINICAL DATA:  Shoulder pain, chest pain EXAM: CT ANGIOGRAPHY CHEST WITH CONTRAST TECHNIQUE: Multidetector CT imaging of the chest was performed using the standard protocol during bolus administration of intravenous contrast. Multiplanar CT image reconstructions and MIPs were obtained to evaluate the vascular anatomy. CONTRAST:  189mL ISOVUE-370 IOPAMIDOL (ISOVUE-370) INJECTION 76% COMPARISON:  08/18/2017, 06/10/2017 FINDINGS: Cardiovascular: Satisfactory opacification of the pulmonary arteries to the segmental level. No evidence of pulmonary embolism. Aortic atherosclerosis. No aneurysmal dilatation. Coronary artery calcification. Enlarged pulmonary artery trunk measuring 3.1 cm. Mild cardiomegaly. No large pericardial effusion Mediastinum/Nodes: No enlarged mediastinal, hilar, or axillary lymph nodes. Thyroid gland, trachea, and esophagus demonstrate no significant findings. Lungs/Pleura: Lungs are clear. No pleural effusion or pneumothorax. Upper Abdomen: No acute abnormality. Musculoskeletal: Presumed post lumpectomy changes of the retroareolar left breast, similar compared to prior. No acute or suspicious bone lesion. Degenerative changes Review of the MIP images confirms the above findings. IMPRESSION: 1. Negative for acute pulmonary embolus or aortic dissection  2. Enlarged pulmonary artery trunk, raises possibility for pulmonary hypertension 3. Clear lung fields Aortic Atherosclerosis (ICD10-I70.0). Electronically Signed   By: Donavan Foil M.D.   On: 08/18/2017 19:01    Disposition   Pt is being discharged home today in good condition.  Follow-up Plans & Appointments    Follow-up Information    Jerline Pain, MD Follow up.   Specialty:  Cardiology Why:  Our office will call you for appointment    Contact information: [address] [place] [address] [place] [address] [phone]          Discharge Instructions    Call MD for:  difficulty breathing, headache or visual disturbances   Complete by:  As directed    Call MD for:  redness, tenderness, or signs of infection (pain, swelling, redness, odor or green/yellow discharge around incision site)   Complete by:  As directed    Call MD for:  severe uncontrolled pain   Complete by:  As directed    Call MD for:  temperature >100.4   Complete by:  As directed    Diet - low sodium heart healthy   Complete by:  As directed    Increase activity slowly   Complete by:  As directed       Discharge Medications   Current Discharge Medication List    START taking these medications   Details  spironolactone (ALDACTONE) 25 MG tablet Take 1 tablet (25 mg total) daily by mouth. Qty: 30 tablet, Refills: 6      CONTINUE these medications which have CHANGED   Details  isosorbide mononitrate (IMDUR) 120 MG 24 hr tablet Take 2 tablets (240 mg total) every morning by mouth. Qty: 30 tablet, Refills: 11      CONTINUE these medications which have NOT CHANGED   Details  amLODipine (NORVASC) 10 MG tablet TAKE ONE (1) TABLET BY MOUTH EVERY DAY Qty: 90 tablet, Refills: 3    aspirin EC 81 MG tablet Take 81 mg by mouth daily.    cetirizine (ZYRTEC) 10 MG tablet Take 10 mg as needed by mouth for allergies.    Coenzyme Q10 (CO Q 10 PO) Take by mouth.    ezetimibe (ZETIA) 10 MG tablet TAKE ONE (1) TABLET BY MOUTH EVERY DAY PLEASE CALL TO SCHEDULE AN APPOINTMENT Qty: 30 tablet, Refills: 11    furosemide (LASIX) 20 MG tablet Take 1 tablet (20 mg total) by mouth 2 (two) times daily as needed. Qty: 60 tablet, Refills: 4    Insulin Glargine (TOUJEO SOLOSTAR) 300 UNIT/ML SOPN Inject 35 Units into the skin daily. Qty: 12 mL, Refills: 1   Associated Diagnoses: Type 2 diabetes mellitus with diabetic neuropathy, with long-term current use  of insulin (HCC)    losartan (COZAAR) 50 MG tablet TAKE ONE (1) TABLET BY MOUTH EVERY DAY Qty: 90 tablet, Refills: 3    metFORMIN (GLUCOPHAGE) 1000 MG tablet TAKE ONE (1) TABLET BY MOUTH TWO (2) TIMES DAILY WITH A MEAL Qty: 180 tablet, Refills: 3    metoprolol succinate (TOPROL-XL) 100 MG 24 hr tablet TAKE ONE (1) TABLET BY MOUTH EVERY DAY Qty: 90 tablet, Refills: 3    Multiple Vitamins-Minerals (MULTIVITAMIN ADULTS PO) Take by mouth.    nitroGLYCERIN (NITRODUR - DOSED IN MG/24 HR) 0.4 mg/hr patch APPLY ONE PATCH ON SKIN EVERY DAY. LEAVE ON FOR 12 HOURS AND OFF FOR 12 HOURS. Qty: 100 patch, Refills: 3    Olopatadine HCl (PATADAY) 0.2 % SOLN Place 1 drop  into both eyes daily as needed. For allergies    repaglinide (PRANDIN) 2 MG tablet Take 1 tablet (2 mg total) by mouth 3 (three) times daily before meals. Qty: 270 tablet, Refills: 3    Turmeric, Curcuma [place], POWD 1 scoop by Does not apply route daily.   Associated Diagnoses: Routine health maintenance    loratadine (CLARITIN) 10 MG tablet TAKE ONE TABLET BY MOUTH ONCE DAILY FOR ALLERGIES Qty: 90 tablet, Refills: 3   Associated Diagnoses: Seasonal allergies      STOP taking these medications     ibuprofen (ADVIL,MOTRIN) 800 MG tablet      nitroGLYCERIN (NITROSTAT) 0.4 MG SL tablet          Aspirin prescribed at discharge?  Yes High Intensity Statin Prescribed? (Lipitor 40-80mg  or Crestor 20-40mg ): Yes For EF 45% or less, Was ACEI/ARB Prescribed? NA ADP Receptor Inhibitor Prescribed? NA For EF <40%, Aldosterone Inhibitor Prescribed? NA Was EF assessed during THIS hospitalization? YES Was Cardiac Rehab II ordered? No  Outstanding Labs/Studies   BMET  Duration of Discharge Encounter   Greater than 30 minutes including physician time.  Signed, Phill Myron. West Pugh, ANP, AACC   08/20/2017, 9:37 AM

## 2017-08-20 NOTE — Progress Notes (Signed)
  Echocardiogram 2D Echocardiogram has been performed.  [name] [name] 08/20/2017, 11:35 AM

## 2017-08-20 NOTE — Progress Notes (Signed)
Patient given discharge instructions and all questions answered.  

## 2017-08-20 NOTE — Care Management Note (Signed)
Case Management Note  Patient Details  Name: [name] MRN: [id number] Date of Birth: 04-24-1951  Subjective/Objective:                 Patient with order to DC to home today. Chart reviewed. No Home Health or Equipment needs, no unacknowledged Case Management consults or medication needs identified at the time of this note. Plan for DC to home. If needs arise today prior to discharge, please call Carles Collet RN CM at [phone]. CM signing off.   Action/Plan:   Expected Discharge Date:  08/20/17               Expected Discharge Plan:  Home/Self Care  In-House Referral:     Discharge planning Services  CM Consult  Post Acute Care Choice:    Choice offered to:     DME Arranged:    DME Agency:     HH Arranged:    HH Agency:     Status of Service:  Completed, signed off  If discussed at [employer] of Stay Meetings, dates discussed:    Additional Comments:  Carles Collet, RN 08/20/2017, 9:51 AM

## 2017-08-20 NOTE — Progress Notes (Addendum)
Progress Note  Patient Name: [name] Date of Encounter: 08/20/2017  Primary Cardiologist: Minus Breeding, MD  Subjective   She feels well, no chest pain even with walking. She desires to go home.  Inpatient Medications    Scheduled Meds: . amLODipine  10 mg Oral Q24H  . aspirin  81 mg Oral Daily  . chlorthalidone  25 mg Oral Daily  . ezetimibe  10 mg Oral Daily  . heparin  5,000 Units Subcutaneous Q8H  . insulin aspart  0-9 Units Subcutaneous TID WC  . isosorbide mononitrate  240 mg Oral Daily  . losartan  50 mg Oral Daily  . metoprolol succinate  100 mg Oral Daily  . potassium chloride  40 mEq Oral Once   Followed by  . [START ON 08/21/2017] potassium chloride  20 mEq Oral Daily  . sodium chloride flush  3 mL Intravenous Q12H   Continuous Infusions: . sodium chloride     PRN Meds: sodium chloride, acetaminophen, nitroGLYCERIN, ondansetron (ZOFRAN) IV, sodium chloride flush   Vital Signs    Vitals:   08/19/17 2022 08/20/17 0047 08/20/17 0417 08/20/17 0737  BP: (!) 155/67 (!) 145/65 128/89 (!) 191/96  Pulse: 73 72 73 72  Resp: 18 20 20    Temp: 98.7 F (37.1 C) 98.2 F (36.8 C) 98.4 F (36.9 C)   TempSrc: Oral Oral Oral   SpO2: 100% 99% 100% 99%  Weight:   215 lb 1.6 oz (97.6 kg)   Height:        Intake/Output Summary (Last 24 hours) at 08/20/2017 0904 Last data filed at 08/20/2017 0039 Gross per 24 hour  Intake 655 ml  Output 1920 ml  Net -1265 ml   Filed Weights   08/18/17 2208 08/19/17 0500 08/20/17 0417  Weight: 216 lb 12.8 oz (98.3 kg) 216 lb (98 kg) 215 lb 1.6 oz (97.6 kg)    Telemetry    NSR rates in the 60's -80's. Personally Reviewed  Physical Exam   GEN: No acute distress.   Neck: No JVD Cardiac: RRR, no murmurs, rubs, or gallops.  Respiratory: Clear to auscultation bilaterally. GI: Soft, nontender, non-distended  MS: No edema; No deformity.Right wrist dressing from cath insertion site is without evidence of bleeding or  edema.  Neuro:  Nonfocal  Psych: Normal affect   Labs    Chemistry Recent Labs  Lab 08/18/17 1610 08/19/17 0334  NA 139 139  K 3.4* 3.4*  CL 104 104  CO2 27 27  GLUCOSE 217* 169*  BUN 13 10  CREATININE 1.08* 0.77  CALCIUM 9.6 9.4  PROT 7.2  --   ALBUMIN 3.7  --   AST 25  --   ALT 22  --   ALKPHOS 81  --   BILITOT 0.3  --   GFRNONAA 52* >60  GFRAA >60 >60  ANIONGAP 8 8     Hematology Recent Labs  Lab 08/18/17 1610 08/19/17 0334  WBC 8.3 6.7  RBC 4.23 4.06  HGB 12.1 11.8*  HCT 37.6 36.0  MCV 88.9 88.7  MCH 28.6 29.1  MCHC 32.2 32.8  RDW 15.4 15.6*  PLT 241 230     Recent Labs  Lab 08/18/17 1623  TROPIPOC 0.00     BNP Recent Labs  Lab 08/18/17 1610  BNP 86.9     DDimer  Recent Labs  Lab 08/18/17 1610  DDIMER 0.78*     Radiology    Dg Chest 2 View  Result Date: 08/18/2017  CLINICAL DATA:  Acute chest pain and shortness of breath for 4 days. EXAM: CHEST  2 VIEW COMPARISON:  06/10/2017 CT, 08/14/2015 radiographs and prior studies. FINDINGS: The cardiomediastinal silhouette is unremarkable. Mild peribronchial thickening is unchanged. There is no evidence of focal airspace disease, pulmonary edema, suspicious pulmonary nodule/mass, pleural effusion, or pneumothorax. No acute bony abnormalities are identified. IMPRESSION: No evidence of acute cardiopulmonary disease. Electronically Signed   By: Margarette Canada M.D.   On: 08/18/2017 16:55   Ct Angio Chest Pe W And/or Wo Contrast  Result Date: 08/18/2017 CLINICAL DATA:  Shoulder pain, chest pain EXAM: CT ANGIOGRAPHY CHEST WITH CONTRAST TECHNIQUE: Multidetector CT imaging of the chest was performed using the standard protocol during bolus administration of intravenous contrast. Multiplanar CT image reconstructions and MIPs were obtained to evaluate the vascular anatomy. CONTRAST:  163mL ISOVUE-370 IOPAMIDOL (ISOVUE-370) INJECTION 76% COMPARISON:  08/18/2017, 06/10/2017 FINDINGS: Cardiovascular: Satisfactory  opacification of the pulmonary arteries to the segmental level. No evidence of pulmonary embolism. Aortic atherosclerosis. No aneurysmal dilatation. Coronary artery calcification. Enlarged pulmonary artery trunk measuring 3.1 cm. Mild cardiomegaly. No large pericardial effusion Mediastinum/Nodes: No enlarged mediastinal, hilar, or axillary lymph nodes. Thyroid gland, trachea, and esophagus demonstrate no significant findings. Lungs/Pleura: Lungs are clear. No pleural effusion or pneumothorax. Upper Abdomen: No acute abnormality. Musculoskeletal: Presumed post lumpectomy changes of the retroareolar left breast, similar compared to prior. No acute or suspicious bone lesion. Degenerative changes Review of the MIP images confirms the above findings. IMPRESSION: 1. Negative for acute pulmonary embolus or aortic dissection 2. Enlarged pulmonary artery trunk, raises possibility for pulmonary hypertension 3. Clear lung fields Aortic Atherosclerosis (ICD10-I70.0). Electronically Signed   By: Donavan Foil M.D.   On: 08/18/2017 19:01    Cardiac Studies  Cardiac catheterization on 08/15/15: Left Heart Cath and Coronary Angiography  Conclusion    Prox RCA lesion, 70% stenosed.  Mid RCA lesion, 30% stenosed.  1st Mrg lesion, 80% stenosed.  Mid LAD lesion, 70% stenosed.  Dist LAD lesion, 70% stenosed.  Dist Cx-1 lesion, 70% stenosed.  Dist Cx-2 lesion, 60% stenosed.  There is mild left ventricular systolic dysfunction.  Low-normal LV dysfunction with an ejection fraction of 50-55% with a small area of the mid inferior mild hypocontractility.  Significant multivessel coronary obstructive disease with evidence for coronary calcification with 70% diffuse proximal to mid LAD stenosis followed by 70% mid-distal LAD stenosis; 75-80% diffuse proximal stenosis in a moderate size OM1 vessel which has several distal bifurcation branches, 70% mid AV groove circumflex and 60% distal circumflex stenosis; and 70%  diffuse proximal RCA stenosis with 30% mid stenosis and a small caliber RCA vessel.  RECOMMENDATION: Initial increasing medical regimen will be recommended. The patient is already being treated with amlodipine and beta blocker therapy; nitrates and Ranexa will be added. Apparently the patient has not tolerated statins, Zetia will be added for lipid-lowering therapy. If recurrent increasing symptoms develop despite maximizing medical therapy, with her underlying diabetes mellitus, consider CABG revascularization for long-term benefit.    Patient Profile     66 y.o. female with history of severe triple vessel disease CAD, with cardiac cath on 11./08/2017, diabetes, Hyperlipidemia, HTN, morbid obesity. Seen by CVTS for evaluation of candidacy for CABG. Patient refused blood transfusions that may be required for CABG, therefore treated medically.   Assessment & Plan    1, CAD: Abnormal cardiac cath with severe 3 vessel disease. She refuses blood transfusions due to religious beliefs. Has been seen by Dr. Nils Pyle  who has discussed this with her. She was not found to be a candidate for CABG as a result. Medical management is planned.   She remains on metoprolol, ASA, High dose Imdur 240 mg,  allergic to ACE, and intolerant to statins. She is on Zetia. May need to considered her for PCSK9 inhibitor therapy.   2. Hypertension: Currently not well controlled on amlodipine, Imdur. Will add cholorthalidone 25 mg daily for BP control. She states that her BP is labile at home and also with position changes.  Will likely not go home today.   3. Hypokalemia: Potassium is 3.4 this am. With CAD will need to keep this at 4.0. Will provide replacement.   4. LEE: Will add chlorthalidone to medication regimen for fluid retention and BP control.Likely from high dose nitrates and amlodipine   5. Diabetes: Treatment per PCP team.   Signed, Phill Myron. West Pugh, ANP, AACC 08/20/2017, 9:04 AM    The  patient was seen, examined and discussed with Jory Sims, NP and I agree with the above.   The patient feels well, no chest pain even with walking, acute issues - hypertension and hypokalemia. I will add extra 40 mEq of KCl and add losartan 25 mg po daily and spironolactone 25 mg po daily to her regimen. This will help with her chronic hypokalemia problem and LE edema.  We will arrange for a follow up in our clinic in 1 week and recheck electrolytes and Crea at that time. IF additional daily KCl needed we will prescribe at that time.  Ena Dawley, MD 08/20/2017

## 2017-08-22 ENCOUNTER — Encounter (HOSPITAL_COMMUNITY): Payer: Self-pay | Admitting: Interventional Cardiology

## 2017-08-22 ENCOUNTER — Other Ambulatory Visit: Payer: Self-pay | Admitting: Cardiology

## 2017-08-22 NOTE — Telephone Encounter (Signed)
Please call need to verify her Isosorbide precription please.

## 2017-08-23 ENCOUNTER — Telehealth: Payer: Self-pay | Admitting: Cardiology

## 2017-08-23 NOTE — Telephone Encounter (Signed)
Informed pt of echo results. Pt verbalized understanding. 

## 2017-08-23 NOTE — Telephone Encounter (Signed)
Called patient about diabetes self care. She agreed to attend December diabetes group meeting on December 20.

## 2017-08-23 NOTE — Telephone Encounter (Signed)
Follow Up:; ° ° °Returning your call. °

## 2017-08-29 ENCOUNTER — Telehealth: Payer: Self-pay | Admitting: Cardiology

## 2017-08-29 ENCOUNTER — Telehealth: Payer: Self-pay

## 2017-08-29 MED ORDER — ISOSORBIDE MONONITRATE ER 120 MG PO TB24: 240.0000 mg | ORAL_TABLET | ORAL | 11 refills | Status: DC

## 2017-08-29 NOTE — Telephone Encounter (Signed)
Patient called requesting a call back from pcp. Said she has been seeing cardiologist & procedures were done. She was told she needs open heart surgery. She's wondering if PCP is made aware of this recent heart condition, she stated " I'm worried because not everbody seem to be on the same page". She's aware of pcp appt 09/15/17.

## 2017-08-29 NOTE — Telephone Encounter (Signed)
New message    Patient calling to confirm dosage on isosorbide mononitrate (IMDUR). Patient states [facility] has two prescriptions with different doses;one script is 120mg  and another for 130mg .   Please clarify, patient does not have medication.   Pt c/o medication issue:  1. Name of Medication: isosorbide mononitrate (IMDUR)   2. How are you currently taking this medication (dosage and times per day)? n/a  3. Are you having a reaction (difficulty breathing--STAT)? n/a  4. What is your medication issue? Dosage clarification needed.

## 2017-08-29 NOTE — Telephone Encounter (Signed)
The patient called in requesting a refill be sent in for her. The pharmacy did not receive the last one. It has been refilled per her request. Isosorbide 240 mg daily.

## 2017-08-29 NOTE — Telephone Encounter (Signed)
Requesting to speak with a nurse about open heart surgery. Please call pt back.

## 2017-08-30 NOTE — Telephone Encounter (Signed)
I followed her hospital stay on computer in real time and am aware of their recs for open heart surgery. pls reassure her I know of the recs and will be happy to discuss with her at her appt. If she wants, I am happy to sch a tele call on Monday Dec 3rd - I just need to know time and tele number.

## 2017-08-31 NOTE — Telephone Encounter (Signed)
Patient will wait til PCP appt (12/13) to discuss.

## 2017-09-02 ENCOUNTER — Encounter: Payer: Self-pay | Admitting: Physician Assistant

## 2017-09-02 ENCOUNTER — Ambulatory Visit (INDEPENDENT_AMBULATORY_CARE_PROVIDER_SITE_OTHER): Payer: Medicare Other | Admitting: Physician Assistant

## 2017-09-02 VITALS — BP 151/79 | HR 73 | Ht 61.0 in | Wt 217.6 lb

## 2017-09-02 DIAGNOSIS — I5032 Chronic diastolic (congestive) heart failure: Secondary | ICD-10-CM

## 2017-09-02 DIAGNOSIS — I10 Essential (primary) hypertension: Secondary | ICD-10-CM | POA: Diagnosis not present

## 2017-09-02 DIAGNOSIS — I5030 Unspecified diastolic (congestive) heart failure: Secondary | ICD-10-CM | POA: Diagnosis not present

## 2017-09-02 DIAGNOSIS — I25118 Atherosclerotic heart disease of native coronary artery with other forms of angina pectoris: Secondary | ICD-10-CM

## 2017-09-02 NOTE — Progress Notes (Signed)
Cardiology Office Note   Date:  09/02/2017   ID:  [name], DOB 03-Feb-1951, MRN [id number]  PCP:  Bartholomew Crews, MD  Cardiologist: Dr. Percival Spanish, 08/19/2017 in hospital Rosaria Ferries, PA-C    History of Present Illness: [name] is a 66 y.o. female with a history of breast CA, D-CHF, HTN, DM, HLD, OSA on CPAP, anemia, morbid obesity  11/15-11/17/2018 for chest pain, cath with multivessel disease and consider CABG, Imdur increased to 240 mg daily, K+ supplemented and patient will need a BMET, Zetia added  [name] presents for cardiology follow up.  She has not had CP since discharge. She has been walking, has to push herself to do this. She has been doing leg lifts, pushups and sit-ups. She is willing to continue exercising, aware this will help to get the weight off.   She has been checking her BP at home. Her SBP has been 120s-140s. One reading was 118/56, she thought that was too low.  Her readings, she is reluctant to make any medication changes.  She has been checking her weight at home. Last week, it was 214, it has been 216-217 this week.  She urinates well on the spironolactone. However, she feels that she is putting on fluid.   She does not feel she is getting too many calories. She goes to DM classes. She is cooking the vast majority of her food, does not use convenience foods, does not add salt. Eats mostly vegetables and some meats. Avoids most carbs. She drinks water, 6-7 (8 oz) glasses daily and also has coffee and tea.   She wonders if she is still supposed to be taking the Imdur 60 mg tabs. She is taking Imdur 120 mg bid and wearing a Nitro patch 0.4 mg qam.  She has used one sublingual nitroglycerin since discharge.  She is aware that the surgeons here will not do surgery on her because she refuses blood products.  She is a Restaurant manager, fast food.  She is looking for a surgeon that we will do the surgery without blood products.   Past Medical  History:  Diagnosis Date  . Arthritis    "knees, lower back" (08/18/2017)  . 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 S/p lumpectomy and radiation.  Marland Kitchen CAD (coronary artery disease) 11/07   cath 11/11 :  3V CADsee report   . Cataract    small both eyes  . Chronic lower back pain   . Chronic pain    MR 08/07/10 :  Spondylosis L4-5 with B foraminal narrowing and L4 nerve root enchroachment B.  . Chronic venous insufficiency 2012   Has had full W/U incl ECHO, LFT's, Cr, Umicro, TSH, BNP. Symptomatic treatment.  . Colonic polyp 1995   Colonoscopies 1994, 2000, and 02/02/2011. Last had 9 polyps - Tubular adenoma and tubulovillous was the pathology results without high grade dysplasia  . Diabetes mellitus type II    Insulin dependent. Has worked with [name] R previously.  . Diastolic CHF, chronic ([place])    NL EF by echo 01/2012, Gr I dd  . Essential hypertension    Requires 5 drug therapy and still difficult to control.  Marland Kitchen GERD (gastroesophageal reflux disease)   . Heart murmur   . Hyperlipidemia    On daily statin  . Iron deficiency anemia    Ferritin was 12 in 2012. on FeSo4. Has had colon and EGD 02/02/11  that showed mild gastritis and colon polyps.  . Obesity    Morbid. HAs worked with [name] T previously.  . OSA (obstructive sleep apnea) 02/16/2007   02/2007 : Moderate AHI 35.6, O2 sat decreased to 65%, CPAP titration to 16 with AHI of 3.6. 02/2014 : Rare respiratory events with sleep disturbance, within normal limits. AHI 0.4 per hour      . OSA on CPAP   . Personal history of radiation therapy    "to my left breast"  . Refusal of blood transfusions as patient is Jehovah's Witness   . Seasonal allergies    "grass pollen"    Past Surgical History:  Procedure Laterality Date  . BREAST LUMPECTOMY Left 2006  . BREAST LUMPECTOMY W/ NEEDLE LOCALIZATION  2006   34 Chemo RX  . CARDIAC CATHETERIZATION N/A 08/15/2015   Procedure: Left Heart  Cath and Coronary Angiography;  Surgeon: Troy Sine, MD;  Location: [facility];  Service: Cardiovascular;  Laterality: N/A;  . COLONOSCOPY W/ BIOPSIES AND POLYPECTOMY  1994, 2000, 2012, 26/018  . LEFT HEART CATH AND CORONARY ANGIOGRAPHY N/A 08/19/2017   Procedure: LEFT HEART CATH AND CORONARY ANGIOGRAPHY;  Surgeon: Belva Crome, MD;  Location: [facility];  Service: Cardiovascular;  Laterality: N/A;  . TONSILLECTOMY AND ADENOIDECTOMY  1964  . TOTAL ABDOMINAL HYSTERECTOMY  1985   "I had endometrosis"  . ULTRASOUND GUIDANCE FOR VASCULAR ACCESS  08/19/2017   Procedure: Ultrasound Guidance For Vascular Access;  Surgeon: Belva Crome, MD;  Location: [facility];  Service: Cardiovascular;;    Current Outpatient Medications  Medication Sig Dispense Refill  . amLODipine (NORVASC) 10 MG tablet TAKE ONE (1) TABLET BY MOUTH EVERY DAY (Patient taking differently: TAKE 10 mg  TABLET BY MOUTH EVERY DAY) 90 tablet 3  . aspirin EC 81 MG tablet Take 81 mg by mouth daily.    . cetirizine (ZYRTEC) 10 MG tablet Take 10 mg as needed by mouth for allergies.    . Chromium 1000 MCG TABS Take 1,000 mcg by mouth daily.    . Coenzyme Q10 (CO Q 10 PO) Take 50 mg by mouth daily.     Marland Kitchen ezetimibe (ZETIA) 10 MG tablet TAKE ONE (1) TABLET BY MOUTH EVERY DAY PLEASE CALL TO SCHEDULE AN APPOINTMENT 30 tablet 11  . ibuprofen (ADVIL,MOTRIN) 800 MG tablet Take 800 mg by mouth every 8 (eight) hours as needed for moderate pain.    . Insulin Glargine (TOUJEO SOLOSTAR) 300 UNIT/ML SOPN Inject 35 Units into the skin daily. 12 mL 1  . isosorbide mononitrate (IMDUR) 120 MG 24 hr tablet Take 2 tablets (240 mg total) by mouth every morning. 30 tablet 11  . loratadine (CLARITIN) 10 MG tablet TAKE ONE TABLET BY MOUTH ONCE DAILY FOR ALLERGIES 90 tablet 3  . losartan (COZAAR) 50 MG tablet TAKE ONE (1) TABLET BY MOUTH EVERY DAY (Patient taking differently: Take 50 mg daily by mouth. TAKE ONE (1) TABLET BY MOUTH  EVERY DAY) 90 tablet 3  . Lutein 6 MG TABS Take 6 mg by mouth daily.    . metFORMIN (GLUCOPHAGE) 1000 MG tablet TAKE ONE (1) TABLET BY MOUTH TWO (2) TIMES DAILY WITH A MEAL (Patient taking differently: Take 1,000 mg 2 (two) times daily by mouth. TAKE ONE (1) TABLET BY MOUTH TWO (2) TIMES DAILY WITH A MEAL) 180 tablet 3  . metoprolol succinate (TOPROL-XL) 100 MG 24 hr tablet TAKE ONE (1) TABLET BY MOUTH EVERY DAY (  Patient taking differently: TAKE 100 gm TABLET BY MOUTH EVERY DAY) 90 tablet 3  . nitroGLYCERIN (NITRODUR - DOSED IN MG/24 HR) 0.4 mg/hr patch APPLY ONE PATCH ON SKIN EVERY DAY. LEAVE ON FOR 12 HOURS AND OFF FOR 12 HOURS. 100 patch 3  . nitroGLYCERIN (NITROSTAT) 0.4 MG SL tablet PLACE 1 TABLET UNDER THE TONGUE EVERY 5 MINUTES AS NEEDED FOR CHEST PAIN 25 tablet 0  . Olopatadine HCl (PATADAY) 0.2 % SOLN Place 1 drop into both eyes daily as needed. For allergies    . OVER THE COUNTER MEDICATION Take 1 tablet by mouth daily. SUPER VITAMIN B-COMPLEX    . repaglinide (PRANDIN) 2 MG tablet Take 1 tablet (2 mg total) by mouth 3 (three) times daily before meals. 270 tablet 3  . spironolactone (ALDACTONE) 25 MG tablet Take 1 tablet (25 mg total) daily by mouth. 30 tablet 6  . Turmeric Curcumin 500 MG CAPS Take 500 mg by mouth daily.     No current facility-administered medications for this visit.     Allergies:   Lisinopril; Oxycodone-acetaminophen; Victoza [liraglutide]; and Atorvastatin    Social History:  The patient  reports that  has never smoked. she has never used smokeless tobacco. She reports that she does not drink alcohol or use drugs.   Family History:  The patient's family history includes Breast cancer in her sister; Depression (age of onset: 51) in her daughter; Diabetes in her father; Heart attack (age of onset: 21) in her brother; Kidney failure in her brother; Lung cancer in her mother; Osteoarthritis in her maternal grandmother; Pulmonary embolism in her brother.    ROS:   Please see the history of present illness. All other systems are reviewed and negative.    PHYSICAL EXAM: VS:  BP (!) 151/79   Pulse 73   Ht 5\' 1"  (1.549 m)   Wt 217 lb 9.6 oz (98.7 kg)   LMP 07/12/1984   BMI 41.12 kg/m  , BMI Body mass index is 41.12 kg/m. GEN: Well nourished, well developed, female in no acute distress  HEENT: normal for age  Neck: Minimal JVD, no carotid bruit, no masses Cardiac: RRR; no murmur, no rubs, or gallops Respiratory: Decreased breath sounds bases bilaterally, normal work of breathing GI: soft, nontender, nondistended, + BS MS: no deformity or atrophy; trace edema; distal pulses are 2+ in all 4 extremities   Skin: warm and dry, no rash Neuro:  Strength and sensation are intact Psych: euthymic mood, full affect   EKG:  EKG is not ordered today.  Cardiac Catheterization 08/19/2017 Conclusion   Severe multivessel coronary disease including a segmental proximal to mid 70-80 80% LAD that was proven hemodynamically significant by FFR of 0.64.  90-95% obstruction in the first obtuse marginal, proximal circumflex 80% stenosis, mid circumflex 90% stenosis, circumflex branches beyond the first obtuse marginal quite small and not graftable.  Small 70-80% ramus intermedius  40% mid body left main stenosis. Catheter damping with engagement using the 6 [place] guide catheter.  Nondominant right coronary with proximal 80-90% obstruction.  Normal left ventricular function with EF estimated at 65%. Normal left ventricular filling pressures.  RECOMMENDATIONS:  Uptitrate medical therapy with additional long-acting nitrate, and beta-blocker.  Recommend the patient consider coronary artery bypass grafting given diabetes and multifocal nature of disease. She has some reservations and wants to know more about alternative treatment strategies but wants to learn about bypass surgery in her particular situation. Percutaneous options exist in the first obtuse  marginal  which is the likely culprit in the mid LAD. Briefly discussed percutaneous option and incomplete nature of revascularization   ECHO: 08/20/2017 - Left ventricle: The cavity size was normal. There was mild   concentric hypertrophy. Systolic function was normal. The   estimated ejection fraction was in the range of 60% to 65%. Wall   motion was normal; there were no regional wall motion   abnormalities. Doppler parameters are consistent with abnormal   left ventricular relaxation (grade 1 diastolic dysfunction).   Doppler parameters are consistent with elevated ventricular   end-diastolic filling pressure. - Aortic valve: Trileaflet; mildly thickened, mildly calcified   leaflets. There was mild regurgitation. - Mitral valve: There was mild regurgitation. - Left atrium: The atrium was normal in size. - Right ventricle: The cavity size was normal. Wall thickness was   normal. Systolic function was normal. - Right atrium: The atrium was normal in size. - Tricuspid valve: There was no regurgitation. - Pulmonary arteries: Systolic pressure could not be accurately   estimated. - Inferior vena cava: The vessel was normal in size. - Pericardium, extracardiac: There was no pericardial effusion   Recent Labs: 08/18/2017: ALT 22; B Natriuretic Peptide 86.9 08/19/2017: BUN 10; Creatinine, Ser 0.77; Hemoglobin 11.8; Platelets 230; Potassium 3.4; Sodium 139    Lipid Panel    Component Value Date/Time   CHOL 239 (H) 11/04/2016 1110   TRIG 90 11/04/2016 1110   HDL 60 11/04/2016 1110   CHOLHDL 4.0 11/04/2016 1110   CHOLHDL 3.7 10/17/2014 1106   VLDL 16 10/17/2014 1106   LDLCALC 161 (H) 11/04/2016 1110   LDLDIRECT 153 (H) 07/13/2011 1055     Wt Readings from Last 3 Encounters:  09/02/17 217 lb 9.6 oz (98.7 kg)  08/20/17 215 lb 1.6 oz (97.6 kg)  06/09/17 216 lb (98 kg)     Other studies Reviewed: Additional studies/ records that were reviewed today include: Office notes,  hospital records and testing.  ASSESSMENT AND PLAN:  1.  Chronic diastolic CHF: She has minimal volume overload by exam.  Upon extensive conversation with the patient regarding sodium intake, fluid intake and medication compliance, the only avenue for improvement in his fluid. She may be getting more than 2 L daily and is asked to limit fluid to 2 L.  She is to continue the Spironolactone and use the Lasix as needed.  Her potassium was low in the hospital and the Spironolactone is new, check a BMET  2.  Obesity: She is working with a nutritionist.  Follow-up with the nutritionist.  She is asked to keep a calorie count as well as a food log.  Her daughter is with her today and agrees to have in the house only what the patient can eat.  3.  Hypertension: She is reluctant to make any medication changes as she feels her blood pressure is good most of the time.  She is asked to continue to monitor her blood pressure.  4.  CAD: He is on good medical therapy with aspirin, a beta-blocker statin.  P.o. twice daily in addition to a 0.4 mg patch daily.  Discuss with MD if she is getting an adequate nitrate free interval.  She has only taken one nitroglycerin sublingual since discharge from the hospital.  Continue to increase activity as tolerated.  Follow-up with cardiac rehab.  Her diagnoses were that would be chronic stable angina.   Current medicines are reviewed at length with the patient today.  The patient does not have  concerns regarding medicines.  The following changes have been made:  no change  Labs/ tests ordered today include:   Orders Placed This Encounter  Procedures  . Basic Metabolic Panel (BMET)  . AMB referral to cardiac rehabilitation     Disposition:   FU with Dr. Percival Spanish  Signed, Rosaria Ferries, PA-C  09/02/2017 4:10 PM    [facility] Phone: [phone]; Fax: [phone]  This note was written with the assistance of speech recognition  software. Please excuse any transcriptional errors.

## 2017-09-02 NOTE — Patient Instructions (Addendum)
Medication Instructions:   STOP TAKING IMDUR  60 MG    If you need a refill on your cardiac medications before your next appointment, please call your pharmacy.  Labwork:  BMET TODAY    Testing/Procedures:  NONE ORDERED  TODAY    Follow-Up:  WITH DR [facility] FIRST AVAILABLE    Any Other Special Instructions Will Be Listed Below (If Applicable).  Drink at least 1 to 2 quarts of water a day    Low-Sodium Eating Plan Sodium, which is an element that makes up salt, helps you maintain a healthy balance of fluids in your body. Too much sodium can increase your blood pressure and cause fluid and waste to be held in your body. Your health care provider or dietitian may recommend following this plan if you have high blood pressure (hypertension), kidney disease, liver disease, or heart failure. Eating less sodium can help lower your blood pressure, reduce swelling, and protect your heart, liver, and kidneys. What are tips for following this plan? General guidelines  Most people on this plan should limit their sodium intake to 1,500-2,000 mg (milligrams) of sodium each day. Reading food labels  The Nutrition Facts label lists the amount of sodium in one serving of the food. If you eat more than one serving, you must multiply the listed amount of sodium by the number of servings.  Choose foods with less than 140 mg of sodium per serving.  Avoid foods with 300 mg of sodium or more per serving. Shopping  Look for lower-sodium products, often labeled as "low-sodium" or "no salt added."  Always check the sodium content even if foods are labeled as "unsalted" or "no salt added".  Buy fresh foods. ? Avoid canned foods and premade or frozen meals. ? Avoid canned, cured, or processed meats  Buy breads that have less than 80 mg of sodium per slice. Cooking  Eat more home-cooked food and less restaurant, buffet, and fast food.  Avoid adding salt when cooking. Use salt-free seasonings or  herbs instead of table salt or sea salt. Check with your health care provider or pharmacist before using salt substitutes.  Cook with plant-based oils, such as canola, sunflower, or olive oil. Meal planning  When eating at a restaurant, ask that your food be prepared with less salt or no salt, if possible.  Avoid foods that contain MSG (monosodium glutamate). MSG is sometimes added to [place] food, bouillon, and some canned foods. What foods are recommended? The items listed may not be a complete list. Talk with your dietitian about what dietary choices are best for you. Grains Low-sodium cereals, including oats, puffed wheat and rice, and shredded wheat. Low-sodium crackers. Unsalted rice. Unsalted pasta. Low-sodium bread. Whole-grain breads and whole-grain pasta. Vegetables Fresh or frozen vegetables. "No salt added" canned vegetables. "No salt added" tomato sauce and paste. Low-sodium or reduced-sodium tomato and vegetable juice. Fruits Fresh, frozen, or canned fruit. Fruit juice. Meats and other protein foods Fresh or frozen (no salt added) meat, poultry, seafood, and fish. Low-sodium canned tuna and salmon. Unsalted nuts. Dried peas, beans, and lentils without added salt. Unsalted canned beans. Eggs. Unsalted nut butters. Dairy Milk. Soy milk. Cheese that is naturally low in sodium, such as ricotta cheese, fresh mozzarella, or Swiss cheese Low-sodium or reduced-sodium cheese. Cream cheese. Yogurt. Fats and oils Unsalted butter. Unsalted margarine with no trans fat. Vegetable oils such as canola or olive oils. Seasonings and other foods Fresh and dried herbs and spices. Salt-free seasonings. Low-sodium mustard  and ketchup. Sodium-free salad dressing. Sodium-free light mayonnaise. Fresh or refrigerated horseradish. Lemon juice. Vinegar. Homemade, reduced-sodium, or low-sodium soups. Unsalted popcorn and pretzels. Low-salt or salt-free chips. What foods are not recommended? The items  listed may not be a complete list. Talk with your dietitian about what dietary choices are best for you. Grains Instant hot cereals. Bread stuffing, pancake, and biscuit mixes. Croutons. Seasoned rice or pasta mixes. Noodle soup cups. Boxed or frozen macaroni and cheese. Regular salted crackers. Self-rising flour. Vegetables Sauerkraut, pickled vegetables, and relishes. Olives. [place] fries. Onion rings. Regular canned vegetables (not low-sodium or reduced-sodium). Regular canned tomato sauce and paste (not low-sodium or reduced-sodium). Regular tomato and vegetable juice (not low-sodium or reduced-sodium). Frozen vegetables in sauces. Meats and other protein foods Meat or fish that is salted, canned, smoked, spiced, or pickled. Bacon, ham, sausage, hotdogs, corned beef, chipped beef, packaged lunch meats, salt pork, jerky, pickled herring, anchovies, regular canned tuna, sardines, salted nuts. Dairy Processed cheese and cheese spreads. Cheese curds. Blue cheese. Feta cheese. String cheese. Regular cottage cheese. Buttermilk. Canned milk. Fats and oils Salted butter. Regular margarine. Ghee. Bacon fat. Seasonings and other foods Onion salt, garlic salt, seasoned salt, table salt, and sea salt. Canned and packaged gravies. Worcestershire sauce. Tartar sauce. Barbecue sauce. Teriyaki sauce. Soy sauce, including reduced-sodium. Steak sauce. Fish sauce. Oyster sauce. Cocktail sauce. Horseradish that you find on the shelf. Regular ketchup and mustard. Meat flavorings and tenderizers. Bouillon cubes. Hot sauce and Tabasco sauce. Premade or packaged marinades. Premade or packaged taco seasonings. Relishes. Regular salad dressings. Salsa. Potato and tortilla chips. Corn chips and puffs. Salted popcorn and pretzels. Canned or dried soups. Pizza. Frozen entrees and pot pies. Summary  Eating less sodium can help lower your blood pressure, reduce swelling, and protect your heart, liver, and kidneys.  Most  people on this plan should limit their sodium intake to 1,500-2,000 mg (milligrams) of sodium each day.  Canned, boxed, and frozen foods are high in sodium. Restaurant foods, fast foods, and pizza are also very high in sodium. You also get sodium by adding salt to food.  Try to cook at home, eat more fresh fruits and vegetables, and eat less fast food, canned, processed, or prepared foods. This information is not intended to replace advice given to you by your health care provider. Make sure you discuss any questions you have with your health care provider. Document Released: 03/12/2002 Document Revised: 09/13/2016 Document Reviewed: 09/13/2016 Elsevier Interactive Patient Education  2017 [employer].

## 2017-09-03 LAB — BASIC METABOLIC PANEL
BUN/Creatinine Ratio: 14 (ref 12–28)
BUN: 12 mg/dL (ref 8–27)
CALCIUM: 10 mg/dL (ref 8.7–10.3)
CHLORIDE: 105 mmol/L (ref 96–106)
CO2: 26 mmol/L (ref 20–29)
Creatinine, Ser: 0.88 mg/dL (ref 0.57–1.00)
GFR calc Af Amer: 79 mL/min/{1.73_m2} (ref >59)
GFR calc non Af Amer: 69 mL/min/{1.73_m2} (ref >59)
Glucose: 117 mg/dL — ABNORMAL HIGH (ref 65–99)
POTASSIUM: 4.8 mmol/L (ref 3.5–5.2)
SODIUM: 144 mmol/L (ref 134–144)

## 2017-09-06 ENCOUNTER — Other Ambulatory Visit: Payer: Self-pay | Admitting: *Deleted

## 2017-09-06 DIAGNOSIS — Z79899 Other long term (current) drug therapy: Secondary | ICD-10-CM

## 2017-09-06 NOTE — Progress Notes (Unsigned)
bmet  

## 2017-09-07 DIAGNOSIS — Z79899 Other long term (current) drug therapy: Secondary | ICD-10-CM | POA: Diagnosis not present

## 2017-09-07 LAB — BASIC METABOLIC PANEL
BUN/Creatinine Ratio: 20 (ref 12–28)
BUN: 19 mg/dL (ref 8–27)
CALCIUM: 10 mg/dL (ref 8.7–10.3)
CO2: 22 mmol/L (ref 20–29)
CREATININE: 0.93 mg/dL (ref 0.57–1.00)
Chloride: 103 mmol/L (ref 96–106)
GFR, EST AFRICAN AMERICAN: 74 mL/min/{1.73_m2} (ref >59)
GFR, EST NON AFRICAN AMERICAN: 64 mL/min/{1.73_m2} (ref >59)
Glucose: 142 mg/dL — ABNORMAL HIGH (ref 65–99)
POTASSIUM: 4.5 mmol/L (ref 3.5–5.2)
Sodium: 139 mmol/L (ref 134–144)

## 2017-09-09 ENCOUNTER — Encounter: Payer: Self-pay | Admitting: Internal Medicine

## 2017-09-09 DIAGNOSIS — H35039 Hypertensive retinopathy, unspecified eye: Secondary | ICD-10-CM | POA: Insufficient documentation

## 2017-09-15 ENCOUNTER — Ambulatory Visit: Payer: Medicare Other | Admitting: Internal Medicine

## 2017-09-15 ENCOUNTER — Encounter: Payer: Self-pay | Admitting: Internal Medicine

## 2017-09-15 ENCOUNTER — Encounter: Payer: Medicare Other | Admitting: Internal Medicine

## 2017-09-15 ENCOUNTER — Ambulatory Visit (INDEPENDENT_AMBULATORY_CARE_PROVIDER_SITE_OTHER): Payer: Medicare Other | Admitting: Internal Medicine

## 2017-09-15 DIAGNOSIS — Z7982 Long term (current) use of aspirin: Secondary | ICD-10-CM | POA: Diagnosis not present

## 2017-09-15 DIAGNOSIS — Z9861 Coronary angioplasty status: Secondary | ICD-10-CM | POA: Diagnosis not present

## 2017-09-15 DIAGNOSIS — E1140 Type 2 diabetes mellitus with diabetic neuropathy, unspecified: Secondary | ICD-10-CM

## 2017-09-15 DIAGNOSIS — I10 Essential (primary) hypertension: Secondary | ICD-10-CM

## 2017-09-15 DIAGNOSIS — Z7984 Long term (current) use of oral hypoglycemic drugs: Secondary | ICD-10-CM | POA: Diagnosis not present

## 2017-09-15 DIAGNOSIS — Z79899 Other long term (current) drug therapy: Secondary | ICD-10-CM

## 2017-09-15 DIAGNOSIS — I2510 Atherosclerotic heart disease of native coronary artery without angina pectoris: Secondary | ICD-10-CM

## 2017-09-15 DIAGNOSIS — I25118 Atherosclerotic heart disease of native coronary artery with other forms of angina pectoris: Secondary | ICD-10-CM

## 2017-09-15 DIAGNOSIS — Z794 Long term (current) use of insulin: Principal | ICD-10-CM

## 2017-09-15 NOTE — Assessment & Plan Note (Addendum)
This problem is chronic and stable. She is on metformin 1000 BID, prandin 2 TID, and Tuejeo 35 units once a day.. She is checking her sugars once or twice at home. They are ranging from 140-185 and are fairly consistent throughout the day. Her average for all sugars is 158. Her most recent A1c last month was 8.8. Goal would definitely be closer to 7. She is in agreement to start a continuous glucose monitor and will make an appointment to come back and have that placed. She is also interested in getting a personal continuous glucose monitor but the professional will allow her to ensure she can tolerate it.  PLAN : CGM placement soon

## 2017-09-15 NOTE — Assessment & Plan Note (Signed)
His problem is chronic and stable. This is managed by Dr. Percival Spanish and his colleagues. She does describe very well her hospital stay and the recommendations which were made throughout that hospital stay. Basically, her catheterization showed three-vessel disease and since she has not responded ideal to medical treatment, they have recommended a CABG. 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 She has since found another cardiology surgical group who would consider her for a CABG and she requests a referral to the surgeon today. We reviewed results of her catheterization from 2007, 2016, in 2018 and why a CABG is currently being recommended. With the exception of a statin, which she is intolerant to, she is on maximal medical therapy.  PLAN : [facility]

## 2017-09-15 NOTE — Progress Notes (Signed)
   Subjective:    Patient ID: [name], female    DOB: Jan 30, 1951, 66 y.o.   MRN: [id number]  HPI  [name] is here for AWV but superceded by HFU concens. Please see the A&P for the status of the pt's chronic medical problems.  ROS : per ROS section and in problem oriented charting. All other systems are negative.  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  Cardiovascular: Negative for chest pain.  Endocrine:       No hypoglycemic sxs       Objective:   Physical Exam  Constitutional: She appears well-developed and well-nourished. No distress.  HENT:  Head: Atraumatic.  Right Ear: External ear normal.  Left Ear: External ear normal.  Nose: Nose normal.  Eyes: Conjunctivae and EOM are normal. Right eye exhibits no discharge. Left eye exhibits no discharge. No scleral icterus.  Skin: She is not diaphoretic.  Psychiatric: She has a normal mood and affect. Her behavior is normal. Judgment and thought content normal.      Assessment & Plan:

## 2017-09-15 NOTE — Assessment & Plan Note (Addendum)
This problem is chronic and stable. She is on spironolactone 25, losartan 50, metoprolol 100, and indoor 240 mg a day. She is almost at goal. Is having no side effects to these medications. She continues to work on her diet and weight and continues to exercise.  PLAN:  Cont current meds

## 2017-09-21 ENCOUNTER — Telehealth (HOSPITAL_COMMUNITY): Payer: Self-pay

## 2017-09-21 NOTE — Telephone Encounter (Signed)
Called and spoke with patient in regards to Cardiac Rehab - Patient is interested in program. Scheduled orientation on 01/22/219 at 8:00am. Patient will attend the 8:15am exc class.

## 2017-09-22 ENCOUNTER — Ambulatory Visit: Payer: Medicare Other | Admitting: Dietician

## 2017-10-10 ENCOUNTER — Telehealth: Payer: Self-pay | Admitting: Internal Medicine

## 2017-10-10 DIAGNOSIS — Z794 Long term (current) use of insulin: Principal | ICD-10-CM

## 2017-10-10 DIAGNOSIS — E1140 Type 2 diabetes mellitus with diabetic neuropathy, unspecified: Secondary | ICD-10-CM

## 2017-10-10 NOTE — Telephone Encounter (Signed)
NEEDS REFILL FOR DIABETES SUPPLIES,TEST STRIPS & LANCETS [facility] [phone]

## 2017-10-10 NOTE — Telephone Encounter (Signed)
[name], can u find out what testing supplies and senr request to PCP? Thanks

## 2017-10-11 MED ORDER — GLUCOSE BLOOD VI STRP: ORAL_STRIP | 5 refills | Status: DC

## 2017-10-11 MED ORDER — ACCU-CHEK FASTCLIX LANCETS MISC: 5 refills | Status: AC

## 2017-10-11 NOTE — Telephone Encounter (Signed)
Called Ms. Byron about her diabetes testing supplies, explained the [employer] pro CGM and she scheduled a time to have this put on on 10/20/17.   She also says she is beginning cardiac rehab on 10/25/17 ( will have the CGM on) and requested coordination of care between dietitians.    Dr. Lynnae January, can you please sign the order for 2019 MNT, DSMT and CGM in this note? Thank you!

## 2017-10-13 ENCOUNTER — Other Ambulatory Visit: Payer: Self-pay | Admitting: Internal Medicine

## 2017-10-17 ENCOUNTER — Telehealth: Payer: Self-pay | Admitting: Internal Medicine

## 2017-10-17 ENCOUNTER — Encounter: Payer: Medicare Other | Admitting: Thoracic Surgery (Cardiothoracic Vascular Surgery)

## 2017-10-17 NOTE — Telephone Encounter (Signed)
Patient said please call her, open heart surgery has been cancel. Pls call patient

## 2017-10-17 NOTE — Telephone Encounter (Signed)
Can we get office notes from the second CV surgery office? I was not aware she had been seen and had surgery scheduled. [employer]

## 2017-10-17 NOTE — Telephone Encounter (Signed)
Return call made to patient-no answer, HIPPA compliant message left on recorder.  Pt instructed to call the office if there is any additional info she needs to relay to her pcp.[name]1/14/20193:06 PM    Will make pcp aware that procedure has been canceled.[name]1/14/20193:07 PM

## 2017-10-19 ENCOUNTER — Telehealth (HOSPITAL_COMMUNITY): Payer: Self-pay | Admitting: Pharmacist

## 2017-10-19 NOTE — Telephone Encounter (Signed)
Cardiac Rehab Medication Review by a Pharmacist  Does the patient  feel that his/her medications are working for him/her?  yes  Has the patient been experiencing any side effects to the medications prescribed?  no  Does the patient measure his/her own blood pressure or blood glucose at home?  Yes, patient takes her BP daily and states that it is "doing well now" and that she checks her CBG multiple times a day and is due to take those numbers to be downloaded from her meter tomorrow. She states that some things she eats will "cause the numbers to go up."   Does the patient have any problems obtaining medications due to transportation or finances?   No, she states that she receives assistance to obtain her medications and she is doing well with acquiring all of her medications at this time.   Understanding of regimen: unable to assess  Understanding of indications: unable to assess  Potential of compliance: unable to assess     Pharmacist comments: [name] is a 67 y.o. female who I called today to reconcile medications for an upcoming cardiac rehab appointment. I tried to explain to [name] that pharmacy makes these calls to make sure we understand the medications that she is taking and assess for additional medications we could be missing. Patient states that we should know all of her medications because she fills with [place] and did not want to communicate further about the medications. I did ask her the above questions and she answered. When I asked [name] if she had any questions, she stated that I did not ask her about her vitamins that she takes. I was confused because she did not want to engage with me prior about her medications. I would maybe try to get her medication history in person when she comes to cardiac rehab, but again she did not want to engage in this conversation over the phone with me at this time.    Jalene Mullet, Pharm.D. PGY1 Pharmacy  Resident 10/19/2017 4:56 PM Main Pharmacy: [phone]

## 2017-10-20 ENCOUNTER — Ambulatory Visit: Payer: Medicare Other | Admitting: Dietician

## 2017-10-20 ENCOUNTER — Ambulatory Visit (INDEPENDENT_AMBULATORY_CARE_PROVIDER_SITE_OTHER): Payer: Medicare Other | Admitting: Dietician

## 2017-10-20 ENCOUNTER — Encounter: Payer: Self-pay | Admitting: Dietician

## 2017-10-20 DIAGNOSIS — Z794 Long term (current) use of insulin: Secondary | ICD-10-CM | POA: Diagnosis not present

## 2017-10-20 DIAGNOSIS — E1140 Type 2 diabetes mellitus with diabetic neuropathy, unspecified: Secondary | ICD-10-CM

## 2017-10-20 DIAGNOSIS — Z6839 Body mass index (BMI) 39.0-39.9, adult: Secondary | ICD-10-CM | POA: Diagnosis not present

## 2017-10-20 DIAGNOSIS — Z713 Dietary counseling and surveillance: Secondary | ICD-10-CM

## 2017-10-20 NOTE — Progress Notes (Signed)
  Medical Nutrition Therapy:  Appt start time: 2423 end time:  5361 Visit # 1st in 2019,  last visit was 06/2017  Assessment:  Primary concerns today: diabetes support and CGM placement.   Patient seen today in group diabetes meeting and in 1:1 for CGM placement and education. [employer] Pro CGM sensor placed and started. Patient was educated about wearing sensor, keeping food, activity and medication log and when to call office. Follow up was arranged with the patient.  Group diabetes meeting focused on what can I eat, learning food groups and how to put together a balanced meal using food groups. We discussed successes, ah-ha moments and challenges.  She is excited about starting cardiac rehab next week. We'll be able to see the full effects of her exercise on blood sugar with the sensor on.   Preferred Learning Style: No preference indicated    Learning Readiness: Ready/Change in progress  ANTHROPOMETRICS: weight-210.8#, BMI-39 WEIGHT HISTORY: fairly stable between 210-227# over past 10 years  MEDICATIONS: 35 units toujeo daily, prandin 2 mg daily 3 times a day before meals, metformin 1000 mg twice a day BLOOD SUGAR:average 158, 80% in target, lowest 75 and no report of hypoglycemia DIETARY INTAKE: Usual eating pattern includes 3 meals and 1-2 snacks per day.   Dining Out (times/week): a few times a week 24-hr recall not done today Beverages: water. Fruit and vegetable juice  Usual physical activity: walks as much as possible daily 30 minutes  Estimated energy needs: 1600-1800 calories/day for gradual weight loss 75-95 g protein/day  Progress Towards Goal(s):  Some progress.   Nutritional Diagnosis:    Spearman 2.2 altered nutrition related laboratory values of A1C of >7% as related to higher blood sugar has imrpoved as evidenced her A1C of 8.8%.(decreased from 9%) and her improved blood sugars.   Intervention:  Nutrition what to eat, food groups, how to make balanced  meals Coordination of care: None today  Teaching Method Utilized: Visual, Auditory, Hands on Handouts given during visit include:what can I eat Barriers to learning/adherence to lifestyle change: competing values Demonstrated degree of understanding via:  Teach Back   Monitoring/Evaluation:  Dietary intake, exercise, meter, and body weight in 4 week(s). [name], RD 10/20/2017 5:11 PM.

## 2017-10-20 NOTE — Patient Instructions (Signed)
Keep records of you food and activity on the sheet provided.   Call with any questions or if you think the sensor is falling off.  Follow up in 1 week.

## 2017-10-25 ENCOUNTER — Encounter (HOSPITAL_COMMUNITY)
Admission: RE | Admit: 2017-10-25 | Discharge: 2017-10-25 | Disposition: A | Discharge status: Home / Self Care | Payer: Medicare Other | Source: Ambulatory Visit | Attending: Cardiology | Admitting: Cardiology

## 2017-10-25 ENCOUNTER — Encounter (HOSPITAL_COMMUNITY): Payer: Self-pay

## 2017-10-25 VITALS — BP 128/70 | HR 66 | Ht 61.0 in | Wt 219.1 lb

## 2017-10-25 DIAGNOSIS — I208 Other forms of angina pectoris: Secondary | ICD-10-CM | POA: Diagnosis not present

## 2017-10-25 DIAGNOSIS — I110 Hypertensive heart disease with heart failure: Secondary | ICD-10-CM | POA: Diagnosis not present

## 2017-10-25 DIAGNOSIS — Z79899 Other long term (current) drug therapy: Secondary | ICD-10-CM | POA: Insufficient documentation

## 2017-10-25 DIAGNOSIS — I5032 Chronic diastolic (congestive) heart failure: Secondary | ICD-10-CM | POA: Diagnosis not present

## 2017-10-25 DIAGNOSIS — G4733 Obstructive sleep apnea (adult) (pediatric): Secondary | ICD-10-CM | POA: Insufficient documentation

## 2017-10-25 DIAGNOSIS — Z794 Long term (current) use of insulin: Secondary | ICD-10-CM | POA: Diagnosis not present

## 2017-10-25 DIAGNOSIS — K219 Gastro-esophageal reflux disease without esophagitis: Secondary | ICD-10-CM | POA: Diagnosis not present

## 2017-10-25 DIAGNOSIS — Z7982 Long term (current) use of aspirin: Secondary | ICD-10-CM | POA: Diagnosis not present

## 2017-10-25 DIAGNOSIS — E785 Hyperlipidemia, unspecified: Secondary | ICD-10-CM | POA: Insufficient documentation

## 2017-10-25 DIAGNOSIS — E119 Type 2 diabetes mellitus without complications: Secondary | ICD-10-CM | POA: Diagnosis not present

## 2017-10-25 NOTE — Progress Notes (Signed)
Cardiac Individual Treatment Plan  Patient Details  Name: [name] MRN: [id number] Date of Birth: 1951/01/14 Referring Provider:     CARDIAC REHAB PHASE II ORIENTATION from 10/25/2017 in [place]  Referring Provider  Minus Breeding, MD.      Initial Encounter Date:    CARDIAC REHAB PHASE II ORIENTATION from 10/25/2017 in [place]  Date  10/25/17  Referring Provider  Minus Breeding, MD.      Visit Diagnosis: Stable angina [facility])  Patient's Home Medications on Admission:  Current Outpatient Medications:  .  ACCU-CHEK FASTCLIX LANCETS MISC, Check blood sugar 3 times a day, Disp: 102 each, Rfl: 5 .  amLODipine (NORVASC) 10 MG tablet, TAKE ONE (1) TABLET BY MOUTH EVERY DAY (Patient taking differently: TAKE 10 mg  TABLET BY MOUTH EVERY DAY), Disp: 90 tablet, Rfl: 3 .  aspirin EC 81 MG tablet, Take 81 mg by mouth daily., Disp: , Rfl:  .  cetirizine (ZYRTEC) 10 MG tablet, Take 10 mg as needed by mouth for allergies., Disp: , Rfl:  .  Chromium 1000 MCG TABS, Take 1,000 mcg by mouth daily., Disp: , Rfl:  .  Coenzyme Q10 (CO Q 10 PO), Take 50 mg by mouth daily. , Disp: , Rfl:  .  ezetimibe (ZETIA) 10 MG tablet, TAKE ONE (1) TABLET BY MOUTH EVERY DAY PLEASE CALL TO SCHEDULE AN APPOINTMENT, Disp: 30 tablet, Rfl: 11 .  glucose blood (ACCU-CHEK SMARTVIEW) test strip, Check blood sugar 3 times a day, Disp: 100 each, Rfl: 5 .  Insulin Glargine (TOUJEO SOLOSTAR) 300 UNIT/ML SOPN, Inject 35 Units into the skin daily., Disp: 12 mL, Rfl: 1 .  isosorbide mononitrate (IMDUR) 120 MG 24 hr tablet, Take 2 tablets (240 mg total) by mouth every morning., Disp: 30 tablet, Rfl: 11 .  loratadine (CLARITIN) 10 MG tablet, TAKE ONE TABLET BY MOUTH ONCE DAILY FOR ALLERGIES, Disp: 90 tablet, Rfl: 3 .  losartan (COZAAR) 50 MG tablet, TAKE ONE (1) TABLET BY MOUTH EVERY DAY (Patient taking differently: Take 50 mg daily by mouth. TAKE ONE (1) TABLET  BY MOUTH EVERY DAY), Disp: 90 tablet, Rfl: 3 .  Lutein 6 MG TABS, Take 6 mg by mouth daily., Disp: , Rfl:  .  metFORMIN (GLUCOPHAGE) 1000 MG tablet, TAKE ONE (1) TABLET BY MOUTH TWO (2) TIMES DAILY WITH A MEAL, Disp: 180 tablet, Rfl: 3 .  metoprolol succinate (TOPROL-XL) 100 MG 24 hr tablet, TAKE ONE (1) TABLET BY MOUTH EVERY DAY (Patient taking differently: TAKE 100 gm TABLET BY MOUTH EVERY DAY), Disp: 90 tablet, Rfl: 3 .  nitroGLYCERIN (NITRODUR - DOSED IN MG/24 HR) 0.4 mg/hr patch, APPLY ONE PATCH ON SKIN EVERY DAY. LEAVE ON FOR 12 HOURS AND OFF FOR 12 HOURS., Disp: 100 patch, Rfl: 3 .  nitroGLYCERIN (NITROSTAT) 0.4 MG SL tablet, PLACE 1 TABLET UNDER THE TONGUE EVERY 5 MINUTES AS NEEDED FOR CHEST PAIN, Disp: 25 tablet, Rfl: 0 .  Olopatadine HCl (PATADAY) 0.2 % SOLN, Place 1 drop into both eyes daily as needed. For allergies, Disp: , Rfl:  .  OVER THE COUNTER MEDICATION, Take 1 tablet by mouth daily. SUPER VITAMIN B-COMPLEX (Vitamin B6 and B12), Disp: , Rfl:  .  repaglinide (PRANDIN) 2 MG tablet, Take 1 tablet (2 mg total) by mouth 3 (three) times daily before meals., Disp: 270 tablet, Rfl: 3 .  spironolactone (ALDACTONE) 25 MG tablet, Take 1 tablet (25 mg total) daily by mouth., Disp:  30 tablet, Rfl: 6 .  Turmeric Curcumin 500 MG CAPS, Take 500 mg by mouth daily., Disp: , Rfl:   Past Medical History: Past Medical History:  Diagnosis Date  . Arthritis    "knees, lower back" (08/18/2017)  . 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 S/p lumpectomy and radiation.  Marland Kitchen CAD (coronary artery disease) 11/07   cath 11/11 :  3V CADsee report   . Cataract    small both eyes  . Chronic lower back pain   . Chronic pain    MR 08/07/10 :  Spondylosis L4-5 with B foraminal narrowing and L4 nerve root enchroachment B.  . Chronic venous insufficiency 2012   Has had full W/U incl ECHO, LFT's, Cr, Umicro, TSH, BNP. Symptomatic treatment.  . Colonic polyp 1995    Colonoscopies 1994, 2000, and 02/02/2011. Last had 9 polyps - Tubular adenoma and tubulovillous was the pathology results without high grade dysplasia  . Diabetes mellitus type II    Insulin dependent. Has worked with [name] R previously.  . Diastolic CHF, chronic ([place])    NL EF by echo 01/2012, Gr I dd  . Essential hypertension    Requires 5 drug therapy and still difficult to control.  Marland Kitchen GERD (gastroesophageal reflux disease)   . Heart murmur   . Hyperlipidemia    On daily statin  . Iron deficiency anemia    Ferritin was 12 in 2012. on FeSo4. Has had colon and EGD 02/02/11 that showed mild gastritis and colon polyps.  . Obesity    Morbid. HAs worked with [name] T previously.  . OSA (obstructive sleep apnea) 02/16/2007   02/2007 : Moderate AHI 35.6, O2 sat decreased to 65%, CPAP titration to 16 with AHI of 3.6. 02/2014 : Rare respiratory events with sleep disturbance, within normal limits. AHI 0.4 per hour      . OSA on CPAP   . Personal history of radiation therapy    "to my left breast"  . Refusal of blood transfusions as patient is Jehovah's Witness   . Seasonal allergies    "grass pollen"    Tobacco Use: Social History   Tobacco Use  Smoking Status Never Smoker  Smokeless Tobacco Never Used    Labs: Recent Review Flowsheet Data    Labs for ITP Cardiac and Pulmonary Rehab Latest Ref Rng & Units 06/24/2016 11/04/2016 03/01/2017 06/09/2017 08/19/2017   Cholestrol 100 - 199 mg/dL - 239(H) - - -   LDLCALC 0 - 99 mg/dL - 161(H) - - -   LDLDIRECT mg/dL - - - - -   HDL >39 mg/dL - 60 - - -   Trlycerides 0 - 149 mg/dL - 90 - - -   Hemoglobin A1c 4.8 - 5.6 % 8.0 7.3 8.6 9.0 8.8(H)   TCO2 0 - 100 mmol/L - - - - -      Capillary Blood Glucose: Lab Results  Component Value Date   GLUCAP 183 (H) 08/20/2017   GLUCAP 155 (H) 08/20/2017   GLUCAP 224 (H) 08/19/2017   GLUCAP 118 (H) 08/19/2017   GLUCAP 170 (H) 08/19/2017     Exercise Target Goals: Date: 10/25/17  Exercise Program  Goal: Individual exercise prescription set using results from initial 6 min walk test and THRR while considering  patient's activity barriers and safety.   Exercise Prescription Goal: Initial exercise prescription builds to 30-45 minutes a day of aerobic activity, 2-3 days per week.  Home exercise guidelines will be given to patient during program as part of exercise prescription that the participant will acknowledge.  Activity Barriers & Risk Stratification: Activity Barriers & Cardiac Risk Stratification - 10/25/17 0839      Activity Barriers & Cardiac Risk Stratification   Activity Barriers  Arthritis;History of Falls;Assistive Device;Balance Concerns    Cardiac Risk Stratification  High       6 Minute Walk: 6 Minute Walk    Row Name 10/25/17 1037         6 Minute Walk   Phase  Initial     Distance  1067 feet     Walk Time  6 minutes     # of Rest Breaks  0     MPH  2.02     METS  2.08     RPE  8     VO2 Peak  7.27     Symptoms  No     Resting HR  66 bpm     Resting BP  128/70     Resting Oxygen Saturation   97 %     Exercise Oxygen Saturation  during 6 min walk  98 %     Max Ex. HR  101 bpm     Max Ex. BP  150/78     2 Minute Post BP  128/62        Oxygen Initial Assessment:   Oxygen Re-Evaluation:   Oxygen Discharge (Final Oxygen Re-Evaluation):   Initial Exercise Prescription: Initial Exercise Prescription - 10/25/17 1000      Date of Initial Exercise RX and Referring Provider   Date  10/25/17    Referring Provider  Minus Breeding, MD.      Bike   Level  0.6    Minutes  10    METs  2.13      NuStep   Level  3    SPM  85    Minutes  10    METs  2.2      Track   Laps  8    Minutes  10    METs  2.39      Prescription Details   Frequency (times per week)  3    Duration  Progress to 30 minutes of continuous aerobic without signs/symptoms of physical distress      Intensity   THRR 40-80% of Max Heartrate  62-123    Ratings of Perceived  Exertion  11-13    Perceived Dyspnea  0-4      Progression   Progression  Continue to progress workloads to maintain intensity without signs/symptoms of physical distress.      Resistance Training   Training Prescription  Yes    Weight  3lbs    Reps  10-15       Perform Capillary Blood Glucose checks as needed.  Exercise Prescription Changes:   Exercise Comments:   Exercise Goals and Review: Exercise Goals    Row Name 10/25/17 1040             Exercise Goals   Increase Physical Activity  Yes       Intervention  Provide advice, education, support and counseling about physical activity/exercise needs.;Develop an individualized exercise prescription for aerobic and resistive training based on initial evaluation findings, risk stratification, comorbidities and participant's personal goals.       Expected Outcomes  Short Term: Attend rehab on a regular basis to increase amount of physical activity.;Long Term: Exercising  regularly at least 3-5 days a week.;Long Term: Add in home exercise to make exercise part of routine and to increase amount of physical activity.       Increase Strength and Stamina  Yes       Intervention  Provide advice, education, support and counseling about physical activity/exercise needs.;Develop an individualized exercise prescription for aerobic and resistive training based on initial evaluation findings, risk stratification, comorbidities and participant's personal goals.       Expected Outcomes  Short Term: Increase workloads from initial exercise prescription for resistance, speed, and METs.;Short Term: Perform resistance training exercises routinely during rehab and add in resistance training at home;Long Term: Improve cardiorespiratory fitness, muscular endurance and strength as measured by increased METs and functional capacity (6MWT)       Able to understand and use rate of perceived exertion (RPE) scale  Yes       Intervention  Provide education and  explanation on how to use RPE scale       Expected Outcomes  Short Term: Able to use RPE daily in rehab to express subjective intensity level;Long Term:  Able to use RPE to guide intensity level when exercising independently       Able to understand and use Dyspnea scale  Yes       Intervention  Provide education and explanation on how to use Dyspnea scale       Expected Outcomes  Short Term: Able to use Dyspnea scale daily in rehab to express subjective sense of shortness of breath during exertion;Long Term: Able to use Dyspnea scale to guide intensity level when exercising independently       Knowledge and understanding of Target Heart Rate Range (THRR)  Yes       Intervention  Provide education and explanation of THRR including how the numbers were predicted and where they are located for reference       Expected Outcomes  Short Term: Able to state/look up THRR;Long Term: Able to use THRR to govern intensity when exercising independently;Short Term: Able to use daily as guideline for intensity in rehab       Able to check pulse independently  Yes       Intervention  Provide education and demonstration on how to check pulse in carotid and radial arteries.;Review the importance of being able to check your own pulse for safety during independent exercise       Expected Outcomes  Short Term: Able to explain why pulse checking is important during independent exercise;Long Term: Able to check pulse independently and accurately       Understanding of Exercise Prescription  Yes       Intervention  Provide education, explanation, and written materials on patient's individual exercise prescription       Expected Outcomes  Short Term: Able to explain program exercise prescription;Long Term: Able to explain home exercise prescription to exercise independently          Exercise Goals Re-Evaluation :    Discharge Exercise Prescription (Final Exercise Prescription Changes):   Nutrition:  Target Goals:  Understanding of nutrition guidelines, daily intake of sodium 1500mg , cholesterol 200mg , calories 30% from fat and 7% or less from saturated fats, daily to have 5 or more servings of fruits and vegetables.  Biometrics: Pre Biometrics - 10/25/17 0807      Pre Biometrics   Height  5\' 1"  (1.549 m)    Weight  219 lb 2.2 oz (99.4 kg)    Waist Circumference  41.5 inches    Hip Circumference  52.25 inches    Waist to Hip Ratio  0.79 %    BMI (Calculated)  41.43    Triceps Skinfold  53 mm    % Body Fat  52.8 %    Grip Strength  33 kg    Flexibility  0 in    Single Leg Stand  4.11 seconds        Nutrition Therapy Plan and Nutrition Goals:   Nutrition Assessments:   Nutrition Goals Re-Evaluation:   Nutrition Goals Re-Evaluation:   Nutrition Goals Discharge (Final Nutrition Goals Re-Evaluation):   Psychosocial: Target Goals: Acknowledge presence or absence of significant depression and/or stress, maximize coping skills, provide positive support system. Participant is able to verbalize types and ability to use techniques and skills needed for reducing stress and depression.  Initial Review & Psychosocial Screening: Initial Psych Review & Screening - 10/25/17 1125      Initial Review   Current issues with  Current Stress Concerns    Source of Stress Concerns  Chronic Illness      Family Dynamics   Good Support System?  Yes      Barriers   Psychosocial barriers to participate in program  The patient should benefit from training in stress management and relaxation.      Screening Interventions   Interventions  Encouraged to exercise    Expected Outcomes  Short Term goal: Utilizing psychosocial counselor, staff and physician to assist with identification of specific Stressors or current issues interfering with healing process. Setting desired goal for each stressor or current issue identified.;Long Term Goal: Stressors or current issues are controlled or eliminated.        Quality of Life Scores:  Scores of 19 and below usually indicate a poorer quality of life in these areas.  A difference of  2-3 points is a clinically meaningful difference.  A difference of 2-3 points in the total score of the Quality of Life Index has been associated with significant improvement in overall quality of life, self-image, physical symptoms, and general health in studies assessing change in quality of life.  PHQ-9: Recent Review Flowsheet Data    Depression screen [facility] 2/9 09/15/2017 06/09/2017 03/01/2017 11/04/2016 09/07/2016   Decreased Interest 0 0 0 0 0   Down, Depressed, Hopeless 0 0 0 0 0   PHQ - 2 Score 0 0 0 0 0     Interpretation of Total Score  Total Score Depression Severity:  1-4 = Minimal depression, 5-9 = Mild depression, 10-14 = Moderate depression, 15-19 = Moderately severe depression, 20-27 = Severe depression   Psychosocial Evaluation and Intervention:   Psychosocial Re-Evaluation:   Psychosocial Discharge (Final Psychosocial Re-Evaluation):   Vocational Rehabilitation: Provide vocational rehab assistance to qualifying candidates.   Vocational Rehab Evaluation & Intervention:   Education: Education Goals: Education classes will be provided on a weekly basis, covering required topics. Participant will state understanding/return demonstration of topics presented.  Learning Barriers/Preferences: Learning Barriers/Preferences - 10/25/17 1042      Learning Barriers/Preferences   Learning Barriers  Sight    Learning Preferences  Written Material       Education Topics: Count Your Pulse:  -Group instruction provided by verbal instruction, demonstration, patient participation and written materials to support subject.  Instructors address importance of being able to find your pulse and how to count your pulse when at home without a heart monitor.  Patients get hands on experience counting their pulse with  staff help and individually.   Heart  Attack, Angina, and Risk Factor Modification:  -Group instruction provided by verbal instruction, video, and written materials to support subject.  Instructors address signs and symptoms of angina and heart attacks.    Also discuss risk factors for heart disease and how to make changes to improve heart health risk factors.   Functional Fitness:  -Group instruction provided by verbal instruction, demonstration, patient participation, and written materials to support subject.  Instructors address safety measures for doing things around the house.  Discuss how to get up and down off the floor, how to pick things up properly, how to safely get out of a chair without assistance, and balance training.   Meditation and Mindfulness:  -Group instruction provided by verbal instruction, patient participation, and written materials to support subject.  Instructor addresses importance of mindfulness and meditation practice to help reduce stress and improve awareness.  Instructor also leads participants through a meditation exercise.    Stretching for Flexibility and Mobility:  -Group instruction provided by verbal instruction, patient participation, and written materials to support subject.  Instructors lead participants through series of stretches that are designed to increase flexibility thus improving mobility.  These stretches are additional exercise for major muscle groups that are typically performed during regular warm up and cool down.   Hands Only CPR:  -Group verbal, video, and participation provides a basic overview of AHA guidelines for community CPR. Role-play of emergencies allow participants the opportunity to practice calling for help and chest compression technique with discussion of AED use.   Hypertension: -Group verbal and written instruction that provides a basic overview of hypertension including the most recent diagnostic guidelines, risk factor reduction with self-care instructions  and medication management.    Nutrition I class: Heart Healthy Eating:  -Group instruction provided by PowerPoint slides, verbal discussion, and written materials to support subject matter. The instructor gives an explanation and review of the Therapeutic Lifestyle Changes diet recommendations, which includes a discussion on lipid goals, dietary fat, sodium, fiber, plant stanol/sterol esters, sugar, and the components of a well-balanced, healthy diet.   Nutrition II class: Lifestyle Skills:  -Group instruction provided by PowerPoint slides, verbal discussion, and written materials to support subject matter. The instructor gives an explanation and review of label reading, grocery shopping for heart health, heart healthy recipe modifications, and ways to make healthier choices when eating out.   Diabetes Question & Answer:  -Group instruction provided by PowerPoint slides, verbal discussion, and written materials to support subject matter. The instructor gives an explanation and review of diabetes co-morbidities, pre- and post-prandial blood glucose goals, pre-exercise blood glucose goals, signs, symptoms, and treatment of hypoglycemia and hyperglycemia, and foot care basics.   Diabetes Blitz:  -Group instruction provided by PowerPoint slides, verbal discussion, and written materials to support subject matter. The instructor gives an explanation and review of the physiology behind type 1 and type 2 diabetes, diabetes medications and rational behind using different medications, pre- and post-prandial blood glucose recommendations and Hemoglobin A1c goals, diabetes diet, and exercise including blood glucose guidelines for exercising safely.    Portion Distortion:  -Group instruction provided by PowerPoint slides, verbal discussion, written materials, and food models to support subject matter. The instructor gives an explanation of serving size versus portion size, changes in portions sizes over the  last 20 years, and what consists of a serving from each food group.   Stress Management:  -Group instruction provided by verbal instruction, video, and  written materials to support subject matter.  Instructors review role of stress in heart disease and how to cope with stress positively.     Exercising on Your Own:  -Group instruction provided by verbal instruction, power point, and written materials to support subject.  Instructors discuss benefits of exercise, components of exercise, frequency and intensity of exercise, and end points for exercise.  Also discuss use of nitroglycerin and activating EMS.  Review options of places to exercise outside of rehab.  Review guidelines for sex with heart disease.   Cardiac Drugs I:  -Group instruction provided by verbal instruction and written materials to support subject.  Instructor reviews cardiac drug classes: antiplatelets, anticoagulants, beta blockers, and statins.  Instructor discusses reasons, side effects, and lifestyle considerations for each drug class.   Cardiac Drugs II:  -Group instruction provided by verbal instruction and written materials to support subject.  Instructor reviews cardiac drug classes: angiotensin converting enzyme inhibitors (ACE-I), angiotensin II receptor blockers (ARBs), nitrates, and calcium channel blockers.  Instructor discusses reasons, side effects, and lifestyle considerations for each drug class.   Anatomy and Physiology of the Circulatory System:  Group verbal and written instruction and models provide basic cardiac anatomy and physiology, with the coronary electrical and arterial systems. Review of: AMI, Angina, Valve disease, Heart Failure, Peripheral Artery Disease, Cardiac Arrhythmia, Pacemakers, and the ICD.   Other Education:  -Group or individual verbal, written, or video instructions that support the educational goals of the cardiac rehab program.   Knowledge Questionnaire Score:   Core  Components/Risk Factors/Patient Goals at Admission: Personal Goals and Risk Factors at Admission - 10/25/17 1035      Core Components/Risk Factors/Patient Goals on Admission    Weight Management  Obesity;Yes    Intervention  Weight Management/Obesity: Establish reasonable short term and long term weight goals.;Obesity: Provide education and appropriate resources to help participant work on and attain dietary goals.    Admit Weight  219 lb 2.2 oz (99.4 kg)    Expected Outcomes  Weight Loss: Understanding of general recommendations for a balanced deficit meal plan, which promotes 1-2 lb weight loss per week and includes a negative energy balance of [phone] kcal/d    Diabetes  Yes    Intervention  Provide education about signs/symptoms and action to take for hypo/hyperglycemia.;Provide education about proper nutrition, including hydration, and aerobic/resistive exercise prescription along with prescribed medications to achieve blood glucose in normal ranges: Fasting glucose 65-99 mg/dL    Expected Outcomes  Short Term: Participant verbalizes understanding of the signs/symptoms and immediate care of hyper/hypoglycemia, proper foot care and importance of medication, aerobic/resistive exercise and nutrition plan for blood glucose control.;Long Term: Attainment of HbA1C < 7%.    Hypertension  Yes    Intervention  Provide education on lifestyle modifcations including regular physical activity/exercise, weight management, moderate sodium restriction and increased consumption of fresh fruit, vegetables, and low fat dairy, alcohol moderation, and smoking cessation.;Monitor prescription use compliance.    Expected Outcomes  Short Term: Continued assessment and intervention until BP is < 140/27mm HG in hypertensive participants. < 130/73mm HG in hypertensive participants with diabetes, heart failure or chronic kidney disease.;Long Term: Maintenance of blood pressure at goal levels.    Lipids  Yes    Intervention   Provide education and support for participant on nutrition & aerobic/resistive exercise along with prescribed medications to achieve LDL 70mg , HDL >40mg .    Expected Outcomes  Short Term: Participant states understanding of desired cholesterol values and is compliant  with medications prescribed. Participant is following exercise prescription and nutrition guidelines.;Long Term: Cholesterol controlled with medications as prescribed, with individualized exercise RX and with personalized nutrition plan. Value goals: LDL < 70mg , HDL > 40 mg.       Core Components/Risk Factors/Patient Goals Review:    Core Components/Risk Factors/Patient Goals at Discharge (Final Review):    ITP Comments: ITP Comments    Row Name 10/25/17 1035           ITP Comments  Medical Director- Dr. Fransico Him, MD.          Comments: Randall attended orientation from 0830 to 0920 to review rules and guidelines for program. Completed 6 minute walk test, Intitial ITP, and exercise prescription.  VSS. Telemetry-.Sinus Rhythm with a first degree heart block . This has been previously documented.  Asymptomatic. Naela used a rollator for stability.Barnet Pall, RN,BSN 10/25/2017 11:34 AM

## 2017-10-27 ENCOUNTER — Other Ambulatory Visit: Payer: Self-pay

## 2017-10-27 ENCOUNTER — Ambulatory Visit (INDEPENDENT_AMBULATORY_CARE_PROVIDER_SITE_OTHER): Payer: Medicare Other | Admitting: Dietician

## 2017-10-27 ENCOUNTER — Ambulatory Visit (INDEPENDENT_AMBULATORY_CARE_PROVIDER_SITE_OTHER): Payer: Medicare Other | Admitting: Internal Medicine

## 2017-10-27 VITALS — BP 131/62 | HR 68 | Temp 98.0°F | Ht 61.0 in | Wt 219.5 lb

## 2017-10-27 DIAGNOSIS — Z713 Dietary counseling and surveillance: Secondary | ICD-10-CM | POA: Diagnosis not present

## 2017-10-27 DIAGNOSIS — E1140 Type 2 diabetes mellitus with diabetic neuropathy, unspecified: Secondary | ICD-10-CM | POA: Diagnosis not present

## 2017-10-27 DIAGNOSIS — I10 Essential (primary) hypertension: Secondary | ICD-10-CM | POA: Diagnosis not present

## 2017-10-27 DIAGNOSIS — G4733 Obstructive sleep apnea (adult) (pediatric): Secondary | ICD-10-CM | POA: Diagnosis not present

## 2017-10-27 DIAGNOSIS — Z6841 Body Mass Index (BMI) 40.0 and over, adult: Secondary | ICD-10-CM

## 2017-10-27 DIAGNOSIS — Z794 Long term (current) use of insulin: Secondary | ICD-10-CM | POA: Diagnosis not present

## 2017-10-27 DIAGNOSIS — I2510 Atherosclerotic heart disease of native coronary artery without angina pectoris: Secondary | ICD-10-CM

## 2017-10-27 NOTE — Progress Notes (Signed)
   CC: diabetes  HPI:  [name] is a 67 y.o. with a PMH of T2DM, CAD, OSA, HTN presenting to clinic for f/u on diabetes; at last visit, she had a CGM placed.  Please see problem based Assessment and Plan for status of patients chronic conditions.  Past Medical History:  Diagnosis Date  . Arthritis    "knees, lower back" (08/18/2017)  . 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 S/p lumpectomy and radiation.  Marland Kitchen CAD (coronary artery disease) 11/07   cath 11/11 :  3V CADsee report   . Cataract    small both eyes  . Chronic lower back pain   . Chronic pain    MR 08/07/10 :  Spondylosis L4-5 with B foraminal narrowing and L4 nerve root enchroachment B.  . Chronic venous insufficiency 2012   Has had full W/U incl ECHO, LFT's, Cr, Umicro, TSH, BNP. Symptomatic treatment.  . Colonic polyp 1995   Colonoscopies 1994, 2000, and 02/02/2011. Last had 9 polyps - Tubular adenoma and tubulovillous was the pathology results without high grade dysplasia  . Diabetes mellitus type II    Insulin dependent. Has worked with [name] R previously.  . Diastolic CHF, chronic ([place])    NL EF by echo 01/2012, Gr I dd  . Essential hypertension    Requires 5 drug therapy and still difficult to control.  Marland Kitchen GERD (gastroesophageal reflux disease)   . Heart murmur   . Hyperlipidemia    On daily statin  . Iron deficiency anemia    Ferritin was 12 in 2012. on FeSo4. Has had colon and EGD 02/02/11 that showed mild gastritis and colon polyps.  . Obesity    Morbid. HAs worked with [name] T previously.  . OSA (obstructive sleep apnea) 02/16/2007   02/2007 : Moderate AHI 35.6, O2 sat decreased to 65%, CPAP titration to 16 with AHI of 3.6. 02/2014 : Rare respiratory events with sleep disturbance, within normal limits. AHI 0.4 per hour      . OSA on CPAP   . Personal history of radiation therapy    "to my left breast"  . Refusal of blood transfusions as patient is  Jehovah's Witness   . Seasonal allergies    "grass pollen"    Review of Systems:   ROS  Patient endorses feeling of weakness, fatigue, and nausea during hyperglycemic episode. Denies chest pain, shortness of breath, polyuria, polydipsia.   Physical Exam:  Vitals:   10/27/17 1021  BP: 131/62  Pulse: 68  Temp: 98 F (36.7 C)  TempSrc: Oral  SpO2: 100%  Weight: 219 lb 8 oz (99.6 kg)  Height: 5\' 1"  (1.549 m)   GENERAL- alert, co-operative, appears as stated age, not in any distress. HEENT- oral mucosa appears moist CARDIAC- RRR, no murmurs, rubs or gallops. RESP- Moving equal volumes of air, and clear to auscultation bilaterally, no wheezes or crackles. ABDOMEN- Soft, nontender, bowel sounds present.  Assessment & Plan:   See Encounters Tab for problem based charting.   Patient discussed with Dr. Verdie Drown, MD Internal Medicine PGY2

## 2017-10-27 NOTE — Patient Instructions (Signed)
Good job keeping such great records.   I hope you enjoyed seeing the results of wearing the Freestyle Continuous Glucose Monitor.   See you next week.   Call if you have questions.  [name] [phone]

## 2017-10-27 NOTE — Progress Notes (Signed)
  Medical Nutrition Therapy:  Appt start time: 6433 end time:  2951 Visit # 2   Assessment:  Primary concerns today: glycemic control, meal planning and  CGM follow up. Patient seen today with Dr. Jari Favre.  CGM sensor was downloaded with 7 more days of wear. The Toujeo is keeping her blood sugar well controlled with some meals causing spikes.  She is beginning cardiac rehab next week Monday- Wednesday - Friday. She will probably need toujeo adjustment on those days or overall to prevent hypoglycemia, and this will allow better coverage of daytime blood sugar spikes.   MEDICATIONS: 35 units toujeo daily in am rotates injections each day between abdomen, arm and thighs, prandin 2 mg daily 3 times a day before meals, metformin 1000 mg twice a day BLOOD SUGAR:average 161, 63% in target, 2 episodes of hypoglycemia without her awaremenss. Pattern shows stable in target to a bit low overnight with rise beginning after breakfast and sharp rise after lunch with a sharp fall after dinner. Meals from [place] and some from home caused spike after breakfast and midday meal with largest spike between 2-7 PM ANTHROPOMETRICS: weight-219.5#, (note that the previous 210# was reported weight done at home. BMI-41 DIETARY INTAKE: Usual eating pattern includes 3 meals and 1-2 snacks per day.   Dining Out (times/week): a few times a week Beverages: water, coffee, Fruit and vegetable juice  Usual physical activity: walks as much as possible uses gym where she lives and does back exercises at night sometimes.   Progress Towards Goal(s):  Some progress.   Nutritional Diagnosis:    [place] 2.2 altered nutrition related laboratory values of A1C of >7% as related to higher blood sugar has imrpoved as evidenced her A1C of 8.8%.(decreased from 9%) and her improved blood sugars.   Intervention:  Nutrition what to eat, food groups, how to make balanced meals Coordination of care: CGM quarterly would be helpful for this patient-  next time ~ April 2019  Teaching Method Utilized: Visual, Auditory, Hands on Handouts given during visit include:CGM dowload Barriers to learning/adherence to lifestyle change: competing values Demonstrated degree of understanding via:  Teach Back   Monitoring/Evaluation:  Dietary intake, exercise, meter, and body weight in 1 week(s). [name] Rolinda Impson, RD 10/27/2017 11:50 AM.    .

## 2017-10-27 NOTE — Patient Instructions (Signed)
Please return in 7 days with Perry for Download #2.   Continue your Toujeo at 35 units daily Continue your metformin at 1000mg  twice daily with meals. For Prandin, take 2mg  with breakfast and dinner, and 4mg  with lunch.  Keep up the great job of keeping your food diary and recognizing what helps and hurts your glucose control.  If you have multiple low sugar episodes, please give the clinic a call.

## 2017-10-28 ENCOUNTER — Encounter: Payer: Self-pay | Admitting: Internal Medicine

## 2017-10-28 NOTE — Assessment & Plan Note (Signed)
Patient has done well with keeping a food diary and able to identify what meals/foods that significantly increase her glucose. From CGM readout, she appears to be hyperglycemic consistently after lunch which she states is her biggest meal of the day mostly. She had two episodes of hypoglycemia - one overnight after not eating a full dinner and injecting toujeo into her abd, and one after lunch after eating a small meal and walking around her neighborhood. She had one episode of symptomatic hyperglycemia to 411 which caused her to feel weak, jittery; she corrected by taking a 2nd dose of toujeo with appropriate decrease of her CBG to 170s by dinner time.  [name] wore the CGM for 8 days. The average reading was 161, % time in target was 63, % time below target was 5, and % time above target was. 32. Intervention will be to increase mealtime prandin to 4mg  daily, and patient to work on dietary changes. The patient will be scheduled to see DM educator and physician for a final appointment.   Plan: --continue toujeo 36 units daily --continue prandin 2mg  with breakfast and dinner, increase to 4mg  with lunch --patient to inject toujeo in arm and thighs from now on for more equal absorption --patient to work on dietary changes and continue exercises MWF and is about to start cardiac rehab --f/u in 1 week for final CGM download

## 2017-10-31 NOTE — Progress Notes (Signed)
Medicine attending: Medical history, presenting problems, physical findings, and medications, reviewed with resident physician Dr Gorica Svalina on the day of the patient visit and I concur with her evaluation and management plan. 

## 2017-10-31 NOTE — Progress Notes (Signed)
KENZLI BARRITT 67 y.o. female DOB 07-Apr-1951 MRN [id number]       Nutrition  1. Stable angina [facility])    Past Medical History:  Diagnosis Date  . Arthritis    "knees, lower back" (08/18/2017)  . 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 S/p lumpectomy and radiation.  Marland Kitchen CAD (coronary artery disease) 11/07   cath 11/11 :  3V CADsee report   . Cataract    small both eyes  . Chronic lower back pain   . Chronic pain    MR 08/07/10 :  Spondylosis L4-5 with B foraminal narrowing and L4 nerve root enchroachment B.  . Chronic venous insufficiency 2012   Has had full W/U incl ECHO, LFT's, Cr, Umicro, TSH, BNP. Symptomatic treatment.  . Colonic polyp 1995   Colonoscopies 1994, 2000, and 02/02/2011. Last had 9 polyps - Tubular adenoma and tubulovillous was the pathology results without high grade dysplasia  . Diabetes mellitus type II    Insulin dependent. Has worked with [name] R previously.  . Diastolic CHF, chronic ([place])    NL EF by echo 01/2012, Gr I dd  . Essential hypertension    Requires 5 drug therapy and still difficult to control.  Marland Kitchen GERD (gastroesophageal reflux disease)   . Heart murmur   . Hyperlipidemia    On daily statin  . Iron deficiency anemia    Ferritin was 12 in 2012. on FeSo4. Has had colon and EGD 02/02/11 that showed mild gastritis and colon polyps.  . Obesity    Morbid. HAs worked with [name] T previously.  . OSA (obstructive sleep apnea) 02/16/2007   02/2007 : Moderate AHI 35.6, O2 sat decreased to 65%, CPAP titration to 16 with AHI of 3.6. 02/2014 : Rare respiratory events with sleep disturbance, within normal limits. AHI 0.4 per hour      . OSA on CPAP   . Personal history of radiation therapy    "to my left breast"  . Refusal of blood transfusions as patient is Jehovah's Witness   . Seasonal allergies    "grass pollen"   Meds reviewed. Toujeo, Metformin, Prandin  noted  HT: Ht Readings from Last 1 Encounters:   10/27/17 5\' 1"  (1.549 m)    WT: Wt Readings from Last 3 Encounters:  10/27/17 219 lb 8 oz (99.6 kg)  10/25/17 219 lb 2.2 oz (99.4 kg)  10/20/17 210 lb 12.8 oz (95.6 kg)     BMI 41.5   Current tobacco use? No  Labs:  Lipid Panel     Component Value Date/Time   CHOL 239 (H) 11/04/2016 1110   TRIG 90 11/04/2016 1110   HDL 60 11/04/2016 1110   CHOLHDL 4.0 11/04/2016 1110   CHOLHDL 3.7 10/17/2014 1106   VLDL 16 10/17/2014 1106   LDLCALC 161 (H) 11/04/2016 1110   LDLDIRECT 153 (H) 07/13/2011 1055    Lab Results  Component Value Date   HGBA1C 8.8 (H) 08/19/2017   CBG (last 3)  No results for input(s): GLUCAP in the last 72 hours.  Nutrition Diagnosis ? Food-and nutrition-related knowledge deficit related to lack of exposure to information as related to diagnosis of: ? CVD ? DM  ? Obesity related to excessive energy intake as evidenced by a BMI of 41.5  Nutrition Goal(s):  ? Pt to identify food quantities necessary to achieve weight loss of 6-24 lb (2.7-10.9 kg) at graduation from cardiac rehab. Goal wt of 60  lb desired.  ? Improved blood glucose control as evidenced by pt's A1c trending from 8.8 toward less than 7.0.  Plan:  Pt to attend nutrition classes ? Nutrition I ? Nutrition II ? Portion Distortion ? Diabetes Blitz ? Diabetes Q & A Will provide client-centered nutrition education as part of interdisciplinary care.   Monitor and evaluate progress toward nutrition goal with team.  Derek Mound, M.Ed, RD, LDN, CDE 10/31/2017 10:49 AM

## 2017-11-01 NOTE — Progress Notes (Signed)
The patient presents for follow up of  CAD.   He was hospitalized last in November 2018 with chest discomfort. She was found to have 70 - 80% LAD stenosis that was significant by FFR.  There was 90 - 95% OM1 stenosis, prox circ 80% a nd mid circ 90% stenosis.  There was high grade disease in a small RI and non domimnant right.  EF was NL.   proximal RCA stenosis, first marginal 80% stenosis, mid LAD 70% stenosis, distal LAD 70% stenosis, distal circumflex 70% stenosis in the circumflex 60% stenosis. The EF was 55%.  However, it was decided to manage her medically at that time.  CABG was discussed but she is unable to consider a blood transfusion so after discussion opted for medical management.  She returns for follow up of this.    Since she was last seen she is actually doing very well.  She is working with a Microbiologist and her primary care doctor on her diabetes.  She is just starting cardiac rehab.  She does walk with a cane but she is able to do some minimal activities and she is not bringing on any of the chest discomfort that she was having before.  The patient denies any new symptoms such as chest discomfort, neck or arm discomfort. There has been no new shortness of breath, PND or orthopnea. There have been no reported palpitations, presyncope or syncope.   Allergies  Allergen Reactions  . Lisinopril Other (See Comments)    Chronic cough secondary to ACE  . Oxycodone-Acetaminophen Hives and Itching    flushing  . Victoza [Liraglutide]     Made her feel "out of it"  . Atorvastatin Other (See Comments)    myalgias    Current Outpatient Medications  Medication Sig Dispense Refill  . ACCU-CHEK FASTCLIX LANCETS MISC Check blood sugar 3 times a day 102 each 5  . amLODipine (NORVASC) 10 MG tablet TAKE ONE (1) TABLET BY MOUTH EVERY DAY (Patient taking differently: TAKE 10 mg  TABLET BY MOUTH EVERY DAY) 90 tablet 3  . aspirin EC 81 MG tablet Take 81 mg by mouth daily.    . cetirizine  (ZYRTEC) 10 MG tablet Take 10 mg as needed by mouth for allergies.    . Chromium 1000 MCG TABS Take 1,000 mcg by mouth daily.    . Coenzyme Q10 (CO Q 10 PO) Take 50 mg by mouth daily.     Marland Kitchen ezetimibe (ZETIA) 10 MG tablet TAKE ONE (1) TABLET BY MOUTH EVERY DAY PLEASE CALL TO SCHEDULE AN APPOINTMENT 30 tablet 11  . glucose blood (ACCU-CHEK SMARTVIEW) test strip Check blood sugar 3 times a day 100 each 5  . Insulin Glargine (TOUJEO SOLOSTAR) 300 UNIT/ML SOPN Inject 35 Units into the skin daily. 12 mL 1  . isosorbide mononitrate (IMDUR) 120 MG 24 hr tablet Take 2 tablets (240 mg total) by mouth every morning. 30 tablet 11  . loratadine (CLARITIN) 10 MG tablet TAKE ONE TABLET BY MOUTH ONCE DAILY FOR ALLERGIES 90 tablet 3  . losartan (COZAAR) 50 MG tablet TAKE ONE (1) TABLET BY MOUTH EVERY DAY (Patient taking differently: Take 50 mg daily by mouth. TAKE ONE (1) TABLET BY MOUTH EVERY DAY) 90 tablet 3  . Lutein 6 MG TABS Take 6 mg by mouth daily.    . metFORMIN (GLUCOPHAGE) 1000 MG tablet TAKE ONE (1) TABLET BY MOUTH TWO (2) TIMES DAILY WITH A MEAL 180 tablet 3  . metoprolol  succinate (TOPROL-XL) 100 MG 24 hr tablet TAKE ONE (1) TABLET BY MOUTH EVERY DAY (Patient taking differently: TAKE 100 gm TABLET BY MOUTH EVERY DAY) 90 tablet 3  . nitroGLYCERIN (NITRODUR - DOSED IN MG/24 HR) 0.4 mg/hr patch APPLY ONE PATCH ON SKIN EVERY DAY. LEAVE ON FOR 12 HOURS AND OFF FOR 12 HOURS. 100 patch 3  . nitroGLYCERIN (NITROSTAT) 0.4 MG SL tablet PLACE 1 TABLET UNDER THE TONGUE EVERY 5 MINUTES AS NEEDED FOR CHEST PAIN 25 tablet 0  . Olopatadine HCl (PATADAY) 0.2 % SOLN Place 1 drop into both eyes daily as needed. For allergies    . OVER THE COUNTER MEDICATION Take 1 tablet by mouth daily. SUPER VITAMIN B-COMPLEX (Vitamin B6 and B12)    . repaglinide (PRANDIN) 2 MG tablet Take 1 tablet (2 mg total) by mouth 3 (three) times daily before meals. 270 tablet 3  . spironolactone (ALDACTONE) 25 MG tablet Take 1 tablet (25 mg total)  daily by mouth. 30 tablet 6  . Turmeric Curcumin 500 MG CAPS Take 500 mg by mouth daily.     No current facility-administered medications for this visit.     Past Medical History:  Diagnosis Date  . Arthritis    "knees, lower back" (08/18/2017)  . 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 S/p lumpectomy and radiation.  Marland Kitchen CAD (coronary artery disease) 11/07   cath 11/11 :  3V CADsee report   . Cataract    small both eyes  . Chronic lower back pain   . Chronic pain    MR 08/07/10 :  Spondylosis L4-5 with B foraminal narrowing and L4 nerve root enchroachment B.  . Chronic venous insufficiency 2012   Has had full W/U incl ECHO, LFT's, Cr, Umicro, TSH, BNP. Symptomatic treatment.  . Colonic polyp 1995   Colonoscopies 1994, 2000, and 02/02/2011. Last had 9 polyps - Tubular adenoma and tubulovillous was the pathology results without high grade dysplasia  . Diabetes mellitus type II    Insulin dependent. Has worked with [name] R previously.  . Diastolic CHF, chronic ([place])    NL EF by echo 01/2012, Gr I dd  . Essential hypertension    Requires 5 drug therapy and still difficult to control.  Marland Kitchen GERD (gastroesophageal reflux disease)   . Heart murmur   . Hyperlipidemia    On daily statin  . Iron deficiency anemia    Ferritin was 12 in 2012. on FeSo4. Has had colon and EGD 02/02/11 that showed mild gastritis and colon polyps.  . Obesity    Morbid. HAs worked with [name] T previously.  . OSA (obstructive sleep apnea) 02/16/2007   02/2007 : Moderate AHI 35.6, O2 sat decreased to 65%, CPAP titration to 16 with AHI of 3.6. 02/2014 : Rare respiratory events with sleep disturbance, within normal limits. AHI 0.4 per hour      . OSA on CPAP   . Personal history of radiation therapy    "to my left breast"  . Refusal of blood transfusions as patient is Jehovah's Witness   . Seasonal allergies    "grass pollen"    Past Surgical History:  Procedure  Laterality Date  . BREAST LUMPECTOMY Left 2006  . BREAST LUMPECTOMY W/ NEEDLE LOCALIZATION  2006   34 Chemo RX  . CARDIAC CATHETERIZATION N/A 08/15/2015   Procedure: Left Heart Cath and Coronary Angiography;  Surgeon: Troy Sine, MD;  Location: [place] [facility];  Service: Cardiovascular;  Laterality: N/A;  . COLONOSCOPY [place]  1994, 2000, 2012, 26/018  . LEFT HEART CATH AND CORONARY ANGIOGRAPHY N/A 08/19/2017   Procedure: LEFT HEART CATH AND CORONARY ANGIOGRAPHY;  Surgeon: Belva Crome, MD;  Location: [place] [facility];  Service: Cardiovascular;  Laterality: N/A;  . TONSILLECTOMY AND ADENOIDECTOMY  1964  . TOTAL ABDOMINAL HYSTERECTOMY  1985   "I had endometrosis"  . ULTRASOUND GUIDANCE FOR VASCULAR ACCESS  08/19/2017   Procedure: Ultrasound Guidance For Vascular Access;  Surgeon: Belva Crome, MD;  Location: [place] [facility];  Service: Cardiovascular;;    ROS:   As stated in the HPI and negative for all other systems.  PHYSICAL EXAM BP (!) 146/72   Pulse 75   Ht 5\' 1"  (1.549 m)   Wt 218 lb 6.4 oz (99.1 kg)   LMP 07/12/1984   BMI 41.27 kg/m   GENERAL:  Well appearing NECK:  No jugular venous distention, waveform within normal limits, carotid upstroke brisk and symmetric, no bruits, no thyromegaly LUNGS:  Clear to auscultation bilaterally CHEST:  Unremarkable HEART:  PMI not displaced or sustained,S1 and S2 within normal limits, no S3, no S4, no clicks, no rubs, no murmurs ABD:  Flat, positive bowel sounds normal in frequency in pitch, no bruits, no rebound, no guarding, no midline pulsatile mass, no hepatomegaly, no splenomegaly EXT:  2 plus pulses throughout, no edema, no cyanosis no clubbing    EKG:   None  Lab Results  Component Value Date   CHOL 239 (H) 11/04/2016   TRIG 90 11/04/2016   HDL 60 11/04/2016   LDLCALC 161 (H) 11/04/2016   LDLDIRECT 153 (H) 07/13/2011   Lab Results  Component Value Date   HGBA1C 8.8 (H) 08/19/2017      ASSESSMENT AND PLAN   CAD:  The patient has no new sypmtoms.  No further cardiovascular testing is indicated.  We will continue with aggressive risk reduction and meds as listed.  She seems to be doing a good job with secondary risk reduction and I will make no change to her medications at this point.  HTN:     She reports her blood pressure is actually running low but today is a little bit elevated.  She wants to back off on medications and I promised her that he we would consider this if I see her blood pressure is running low in the future.  For now she will continue the meds as listed.   HYPERLIPIDEMIA:     She is going to get lipids done tomorrow she does not want to consider PCSK9 yet and she is intolerant of statin.  She would consider a Lipid Clinic Appt in our office if she is not at target.    DIABETES:  Her hemoglobin A1c was increased to 8.8 as above.  However, she is had her meds changed and followed closely by Dr. Lynnae January.  No change in therapy.  OBESITY: We talked about weight loss and fitness.  She will continue with above secondary risk reduction.

## 2017-11-02 ENCOUNTER — Encounter (HOSPITAL_COMMUNITY): Payer: Medicare Other

## 2017-11-02 ENCOUNTER — Encounter (HOSPITAL_COMMUNITY): Payer: Self-pay

## 2017-11-02 ENCOUNTER — Encounter (HOSPITAL_COMMUNITY)
Admission: RE | Admit: 2017-11-02 | Discharge: 2017-11-02 | Disposition: A | Discharge status: Home / Self Care | Payer: Medicare Other | Source: Ambulatory Visit | Attending: Cardiology | Admitting: Cardiology

## 2017-11-02 DIAGNOSIS — Z794 Long term (current) use of insulin: Secondary | ICD-10-CM | POA: Diagnosis not present

## 2017-11-02 DIAGNOSIS — I208 Other forms of angina pectoris: Secondary | ICD-10-CM

## 2017-11-02 DIAGNOSIS — E119 Type 2 diabetes mellitus without complications: Secondary | ICD-10-CM | POA: Diagnosis not present

## 2017-11-02 DIAGNOSIS — Z79899 Other long term (current) drug therapy: Secondary | ICD-10-CM | POA: Diagnosis not present

## 2017-11-02 DIAGNOSIS — I110 Hypertensive heart disease with heart failure: Secondary | ICD-10-CM | POA: Diagnosis not present

## 2017-11-02 DIAGNOSIS — Z7982 Long term (current) use of aspirin: Secondary | ICD-10-CM | POA: Diagnosis not present

## 2017-11-02 LAB — GLUCOSE, CAPILLARY
GLUCOSE-CAPILLARY: 102 mg/dL — AB (ref 65–99)
GLUCOSE-CAPILLARY: 123 mg/dL — AB (ref 65–99)

## 2017-11-02 NOTE — Progress Notes (Signed)
Daily Session Note  Patient Details  Name: [name] MRN: [id number] Date of Birth: [birth year] Referring Provider:     CARDIAC REHAB PHASE II ORIENTATION from 10/25/2017 in [place]  Referring Provider  Minus Breeding, MD.      Encounter Date: 11/02/2017  Check In:   Capillary Blood Glucose: Results for orders placed or performed during the hospital encounter of 11/02/17 (from the past 24 hour(s))  Glucose, capillary     Status: Abnormal   Collection Time: 11/02/17  2:13 PM  Result Value Ref Range   Glucose-Capillary 102 (H) 65 - 99 mg/dL      Social History   Tobacco Use  Smoking Status Never Smoker  Smokeless Tobacco Never Used    Goals Met:  Exercise tolerated well  Goals Unmet:  Not Applicable  Comments: Pt started cardiac rehab today.  Pt tolerated light exercise without difficulty. VSS, telemetry-sinus rhythm,  asymptomatic.  Medication list reconciled. Pt denies barriers to medicaiton compliance.  PSYCHOSOCIAL ASSESSMENT:  PHQ-0. Marland Kitchen Pt exhibits positive coping skills, hopeful outlook with supportive family. Pt with health related anxiety from chronic illness, financial and transportation barriers.     Pt enjoys crocheting.  Pt goals for cardiac rehab are to increase activity level and stay healthy.    Pt oriented to exercise equipment and routine.    Understanding verbalized.   Dr. Fransico Him is Medical Director for Cardiac Rehab at [facility].

## 2017-11-02 NOTE — Progress Notes (Signed)
Daily Session Note  Patient Details  Name: [name] MRN: [id number] Date of Birth: 1951/02/28 Referring Provider:     CARDIAC REHAB PHASE II ORIENTATION from 10/25/2017 in [place]  Referring Provider  Minus Breeding, MD.      Encounter Date: 11/02/2017  Check In: Session Check In - 11/02/17 1522      Check-In   Location  [facility]    Staff Present  Dorma Russell, MS,ACSM CEP, Exercise Physiologist;Amber Fair, MS, ACSM RCEP, Exercise Physiologist;Joann Rion, RN, Marga Melnick, RN, BSN    Supervising physician immediately available to respond to emergencies  [facility] immediately available    Physician(s)  Dr.Regalado    Medication changes reported      No    Fall or balance concerns reported     No    Tobacco Cessation  No Change    Warm-up and Cool-down  Performed as group-led instruction    Resistance Training Performed  No    VAD Patient?  No      Pain Assessment   Currently in Pain?  No/denies       Capillary Blood Glucose: Results for orders placed or performed during the hospital encounter of 11/02/17 (from the past 24 hour(s))  Glucose, capillary     Status: Abnormal   Collection Time: 11/02/17  2:13 PM  Result Value Ref Range   Glucose-Capillary 102 (H) 65 - 99 mg/dL      Social History   Tobacco Use  Smoking Status Never Smoker  Smokeless Tobacco Never Used    Goals Met:  Exercise tolerated well  Goals Unmet:  Not Applicable  Comments: Pt started cardiac rehab today.  Pt tolerated light exercise without difficulty. VSS, telemetry-sinus rhythm,  asymptomatic.  Medication list reconciled. Pt denies barriers to medicaiton compliance.  PSYCHOSOCIAL ASSESSMENT:  PHQ-0. Pt exhibits positive coping skills, hopeful outlook with supportive family. No psychosocial needs identified at this time, no psychosocial interventions necessary.    Pt enjoys crocheting.   Pt oriented to exercise  equipment and routine.    Understanding verbalized.   Dr. Fransico Him is Medical Director for Cardiac Rehab at [facility].

## 2017-11-03 ENCOUNTER — Encounter: Payer: Self-pay | Admitting: Cardiology

## 2017-11-03 ENCOUNTER — Ambulatory Visit (INDEPENDENT_AMBULATORY_CARE_PROVIDER_SITE_OTHER): Payer: Medicare Other | Admitting: Cardiology

## 2017-11-03 ENCOUNTER — Encounter: Payer: Self-pay | Admitting: Internal Medicine

## 2017-11-03 ENCOUNTER — Ambulatory Visit: Payer: Medicare Other

## 2017-11-03 VITALS — BP 146/72 | HR 75 | Ht 61.0 in | Wt 218.4 lb

## 2017-11-03 DIAGNOSIS — E118 Type 2 diabetes mellitus with unspecified complications: Secondary | ICD-10-CM

## 2017-11-03 DIAGNOSIS — I208 Other forms of angina pectoris: Secondary | ICD-10-CM

## 2017-11-03 DIAGNOSIS — E785 Hyperlipidemia, unspecified: Secondary | ICD-10-CM

## 2017-11-03 DIAGNOSIS — Z794 Long term (current) use of insulin: Secondary | ICD-10-CM | POA: Diagnosis not present

## 2017-11-03 DIAGNOSIS — I25119 Atherosclerotic heart disease of native coronary artery with unspecified angina pectoris: Secondary | ICD-10-CM

## 2017-11-03 DIAGNOSIS — I209 Angina pectoris, unspecified: Secondary | ICD-10-CM

## 2017-11-03 NOTE — Patient Instructions (Signed)
Medication Instructions:  Continue current medications  If you need a refill on your cardiac medications before your next appointment, please call your pharmacy.  Labwork: Get Fasting Lipids from PCP office  Testing/Procedures: None Ordered  Follow-Up: Your physician wants you to follow-up in: 6 Months with Rosaria Ferries. You should receive a reminder letter in the mail two months in advance. If you do not receive a letter, please call our office [phone].    Thank you for choosing [facility]!!

## 2017-11-04 ENCOUNTER — Ambulatory Visit (INDEPENDENT_AMBULATORY_CARE_PROVIDER_SITE_OTHER): Payer: Medicare Other | Admitting: Dietician

## 2017-11-04 ENCOUNTER — Ambulatory Visit: Payer: Medicare Other

## 2017-11-04 ENCOUNTER — Encounter: Payer: Medicare Other | Admitting: Dietician

## 2017-11-04 ENCOUNTER — Encounter (HOSPITAL_COMMUNITY): Payer: Medicare Other

## 2017-11-04 ENCOUNTER — Encounter (HOSPITAL_COMMUNITY)
Admission: RE | Admit: 2017-11-04 | Discharge: 2017-11-04 | Disposition: A | Discharge status: Home / Self Care | Payer: Medicare Other | Source: Ambulatory Visit | Attending: Cardiology | Admitting: Cardiology

## 2017-11-04 ENCOUNTER — Encounter: Payer: Self-pay | Admitting: Dietician

## 2017-11-04 ENCOUNTER — Encounter: Payer: Self-pay | Admitting: Internal Medicine

## 2017-11-04 ENCOUNTER — Other Ambulatory Visit: Payer: Self-pay

## 2017-11-04 ENCOUNTER — Ambulatory Visit (INDEPENDENT_AMBULATORY_CARE_PROVIDER_SITE_OTHER): Payer: Medicare Other | Admitting: Internal Medicine

## 2017-11-04 VITALS — BP 117/67 | HR 58 | Temp 97.9°F | Ht 61.0 in | Wt 217.9 lb

## 2017-11-04 DIAGNOSIS — E1140 Type 2 diabetes mellitus with diabetic neuropathy, unspecified: Secondary | ICD-10-CM

## 2017-11-04 DIAGNOSIS — Z713 Dietary counseling and surveillance: Secondary | ICD-10-CM | POA: Diagnosis not present

## 2017-11-04 DIAGNOSIS — Z794 Long term (current) use of insulin: Secondary | ICD-10-CM | POA: Insufficient documentation

## 2017-11-04 DIAGNOSIS — I5032 Chronic diastolic (congestive) heart failure: Secondary | ICD-10-CM | POA: Diagnosis not present

## 2017-11-04 DIAGNOSIS — E785 Hyperlipidemia, unspecified: Secondary | ICD-10-CM | POA: Insufficient documentation

## 2017-11-04 DIAGNOSIS — K219 Gastro-esophageal reflux disease without esophagitis: Secondary | ICD-10-CM | POA: Diagnosis not present

## 2017-11-04 DIAGNOSIS — E7849 Other hyperlipidemia: Secondary | ICD-10-CM

## 2017-11-04 DIAGNOSIS — Z79899 Other long term (current) drug therapy: Secondary | ICD-10-CM | POA: Insufficient documentation

## 2017-11-04 DIAGNOSIS — G4733 Obstructive sleep apnea (adult) (pediatric): Secondary | ICD-10-CM | POA: Insufficient documentation

## 2017-11-04 DIAGNOSIS — Z7982 Long term (current) use of aspirin: Secondary | ICD-10-CM | POA: Insufficient documentation

## 2017-11-04 DIAGNOSIS — Z6841 Body Mass Index (BMI) 40.0 and over, adult: Secondary | ICD-10-CM

## 2017-11-04 DIAGNOSIS — I110 Hypertensive heart disease with heart failure: Secondary | ICD-10-CM | POA: Diagnosis not present

## 2017-11-04 DIAGNOSIS — E119 Type 2 diabetes mellitus without complications: Secondary | ICD-10-CM | POA: Diagnosis not present

## 2017-11-04 DIAGNOSIS — I208 Other forms of angina pectoris: Secondary | ICD-10-CM | POA: Diagnosis not present

## 2017-11-04 DIAGNOSIS — I10 Essential (primary) hypertension: Secondary | ICD-10-CM

## 2017-11-04 LAB — POCT GLYCOSYLATED HEMOGLOBIN (HGB A1C): Hemoglobin A1C: 7.9

## 2017-11-04 LAB — GLUCOSE, CAPILLARY
Glucose-Capillary: 163 mg/dL — ABNORMAL HIGH (ref 65–99)
Glucose-Capillary: 208 mg/dL — ABNORMAL HIGH (ref 65–99)
Glucose-Capillary: 175 mg/dL — ABNORMAL HIGH (ref 65–99)

## 2017-11-04 MED ORDER — REPAGLINIDE 2 MG PO TABS: ORAL_TABLET | ORAL | 3 refills | Status: DC

## 2017-11-04 NOTE — Progress Notes (Signed)
CC: Follow up of diabetes   HPI:  [name] is a 67 y.o. with PMH as listed below who presents for follow up of diabetes. Please see the assessment and plans for the status of the patient chronic medical problems.   Past Medical History:  Diagnosis Date  . Arthritis    "knees, lower back" (08/18/2017)  . 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 S/p lumpectomy and radiation.  Marland Kitchen CAD (coronary artery disease) 11/07   cath 11/11 :  3V CADsee report   . Cataract    small both eyes  . Chronic lower back pain   . Chronic pain    MR 08/07/10 :  Spondylosis L4-5 with B foraminal narrowing and L4 nerve root enchroachment B.  . Chronic venous insufficiency 2012   Has had full W/U incl ECHO, LFT's, Cr, Umicro, TSH, BNP. Symptomatic treatment.  . Colonic polyp 1995   Colonoscopies 1994, 2000, and 02/02/2011. Last had 9 polyps - Tubular adenoma and tubulovillous was the pathology results without high grade dysplasia  . Diabetes mellitus type II    Insulin dependent. Has worked with [name] R previously.  . Diastolic CHF, chronic ([place])    NL EF by echo 01/2012, Gr I dd  . Essential hypertension    Requires 5 drug therapy and still difficult to control.  Marland Kitchen GERD (gastroesophageal reflux disease)   . Heart murmur   . Hyperlipidemia    On daily statin  . Iron deficiency anemia    Ferritin was 12 in 2012. on FeSo4. Has had colon and EGD 02/02/11 that showed mild gastritis and colon polyps.  . Obesity    Morbid. HAs worked with [name] T previously.  . OSA (obstructive sleep apnea) 02/16/2007   02/2007 : Moderate AHI 35.6, O2 sat decreased to 65%, CPAP titration to 16 with AHI of 3.6. 02/2014 : Rare respiratory events with sleep disturbance, within normal limits. AHI 0.4 per hour      . OSA on CPAP   . Personal history of radiation therapy    "to my left breast"  . Refusal of blood transfusions as patient is Jehov[employer]'s Witness   . Seasonal  allergies    "grass pollen"   Review of Systems:  Refer to history of present illness and assessment and plans for pertinent review of systems, all others reviewed and negative  Physical Exam:  Vitals:   11/04/17 0939 11/04/17 1056  BP: (!) 155/72 117/67  Pulse: (!) 57 (!) 58  Temp: 97.9 F (36.6 C)   TempSrc: Oral   SpO2: 100%   Weight: 217 lb 14.4 oz (98.8 kg)   Height: 5\' 1"  (1.549 m)    General: well appearing, no acute distress Cardiac: RRR, no murmur appreciated  Pulm: lungs clear to auscultation bilaterally   Assessment & Plan:   Diabetes  Here today for second Libre check. Reviewed last weeks libre readings and average blood glucose is 137 with hyperglycemic readings  (>180) most predominant in the afternoon and in the evening. She did not have any hypoglycemia but became close to hypoglycemia ( CBG 70s ) with exercise.  This average blood glucose is improved from 163 average over the week prior. At the last office visit she was told to increase her prandin 2 mg TID to 2 mg 30 minutes prior to breakfast and 4 mg 30 minutes prior to lunch and dinner. There was some misunderstanding with this change  and he has stopped taking the supper Prandin and has been taking 2 tablets with breakfast and lunch. - start taking prandin 2 mg q breakfast, 4 mg q lunch, and 4 mg q dinner on Sunday, Tuesday, Friday, and Saturday ( on days when she does not go to cardiac rehab)  - take prandin 2 mg with breakfast and lunch and 4 mg q supper on days when she exercises in the afternoon ( for ex 3 pm cardiac rehab)  - continue glargine 35 u qd and metformin 1000 mg BID  - A1c today 7.9, this is improved from 8.8 when last checked 3 months ago   Hypertension  Blood pressure was initially elevated at the time of presentation but improved prior to leaving the office. She says that her blood pressure was also elevated when she saw her cardiologist yesterday. She has dizziness at times during exercise. Her  blood pressure is checked MWF at cardiac rehab and she also checks it at home, she says that most of these readings have been around 120. She wil bring her log with her to next OV. We went through her medication list today and confirmed that she is taking everything as prescribed. - continue amlodipine 10 mg daily and losartan 50 mg daily (she takes these in the evening), Imdur 120 mg twice daily, metoprolol succinate 100 mg daily, and spironolactone 25 mg daily -  bring blood pressure log and meter to next office visit    Hyperlipidemia  Patient is requesting a repeat of her lipid panel today. She has fasted for this because she was told that she needed one at her last visit with her cardiologist and was told to ask for a lipid panel at her next visit at her primary care doctors office. She is on zetia 10 mg daily. Her LDL was 161 and total cholesterol 239 on last check which was not fasting one year ago. She thinks her cardiologist is going to consider starting her on an injected lipid lowering medication if cholesterol remains elevated.  - ordered lipid panel, will forward this to her cardiologist when results come through  - continue zetia    See Encounters Tab for problem based charting.  Patient discussed with Dr. Rebeca Alert

## 2017-11-04 NOTE — Progress Notes (Signed)
  Medical Nutrition Therapy:  Appt start time: 940 end time:  1000 Visit # 3   Assessment:  Primary concerns today: glycemic control, meal planning and  CGM follow up. Patient seen today with Dr. Hetty Ely.  CGM sensor was removed and downloaded with 14 full days of recording of blood sugars.  She is being told at cardiac rehab to "go eat" to prevent hypoglycemia.  Suggested guidelines to decrease hypoglycemic medicine (toujeo  By 2 units or could also do temp decrease of prandin) on exercise days.   MEDICATIONS: 35 units toujeo, prandin 4 mg daily  Breakfast & lunch and stopped the one with her evening meal, metformin 1000 mg twice a day BLOOD SUGAR:average 137 this past week- 158 overall, more in target, 1 very small episode of hypoglycemia without her awareness and she thinks was on an exercise day.Pattern shows much improvement with much flatter line over the day. ANTHROPOMETRICS: weight-217#, BMI-41 DIETARY INTAKE: She did not bring her log today, but says she thinks most spikes are from bread or crackers, sometimes when Dining Out: a few times a week  Usual physical activity: walks as much as possible uses gym where she lives and does back exercises at night sometimes.   Progress Towards Goal(s):  Some progress.   Nutritional Diagnosis:    [place] 2.2 altered nutrition related laboratory values of A1C of >7% as related to higher blood sugar has imrpoved as evidenced her A1C of 8.8%.(decreased from 9%) and her improved blood sugars.   Intervention:  Nutrition education about trying rye or pumpernickel or sourdough, what to eat, food groups, how to make balanced meals Coordination of care: CGM quarterly would be helpful for this patient- next time ~ April 2019  Teaching Method Utilized: Visual, Auditory, Hands on Handouts given during visit include:CGM dowload Barriers to learning/adherence to lifestyle change: competing values Demonstrated degree of understanding via:  Teach Back    Monitoring/Evaluation:  Dietary intake, exercise, meter, and body weight in group Feb 21 [place], RD 11/04/2017 10:20 AM.    .

## 2017-11-04 NOTE — Patient Instructions (Addendum)
[name],   [name] are doing a great job with your blood glucose control. Please follow these adjustments to you medications:   On days when you are not exercising after lunch or going to cardiac rehab: Taking prandin 2 mg (1 tablet) with breakfast Take prandin 4 mg (2 tablets) with lunch and dinner   On days when you are going to exercise in the afternoon or go to cardiac rehab  Take prandin 2 mg ( 1 tablets )  with breakfast and lunch  Take prandin 4 mg (2 tablets ) with supper   Continue glargine 35 u qd and metformin 1000 mg BID   Continue taking metformin 1000 mg twice daily   Stop by the lab to have your A1c checked then schedule a follow up:   FOLLOW-UP INSTRUCTIONS When: 1-3 month, with Dr. Lynnae January For: recheck of your diabetes  What to bring: blood glucose meter, medication bottles

## 2017-11-04 NOTE — Patient Instructions (Signed)
RYE, PUMPERNICKEL and SOURDOUGH Bread and crackers may help with blood sugar spikes.

## 2017-11-05 LAB — LIPID PANEL
Chol/HDL Ratio: 3.2 ratio (ref 0.0–4.4)
Cholesterol, Total: 185 mg/dL (ref 100–199)
HDL: 57 mg/dL (ref >39)
LDL Calculated: 112 mg/dL — ABNORMAL HIGH (ref 0–99)
Triglycerides: 78 mg/dL (ref 0–149)
VLDL Cholesterol Cal: 16 mg/dL (ref 5–40)

## 2017-11-07 ENCOUNTER — Encounter (HOSPITAL_COMMUNITY)
Admission: RE | Admit: 2017-11-07 | Discharge: 2017-11-07 | Disposition: A | Discharge status: Home / Self Care | Payer: Medicare Other | Source: Ambulatory Visit | Attending: Cardiology | Admitting: Cardiology

## 2017-11-07 DIAGNOSIS — Z79899 Other long term (current) drug therapy: Secondary | ICD-10-CM | POA: Diagnosis not present

## 2017-11-07 DIAGNOSIS — E119 Type 2 diabetes mellitus without complications: Secondary | ICD-10-CM | POA: Diagnosis not present

## 2017-11-07 DIAGNOSIS — Z794 Long term (current) use of insulin: Secondary | ICD-10-CM | POA: Diagnosis not present

## 2017-11-07 DIAGNOSIS — Z7982 Long term (current) use of aspirin: Secondary | ICD-10-CM | POA: Diagnosis not present

## 2017-11-07 DIAGNOSIS — I208 Other forms of angina pectoris: Secondary | ICD-10-CM

## 2017-11-07 DIAGNOSIS — I110 Hypertensive heart disease with heart failure: Secondary | ICD-10-CM | POA: Diagnosis not present

## 2017-11-07 LAB — GLUCOSE, CAPILLARY: Glucose-Capillary: 164 mg/dL — ABNORMAL HIGH (ref 65–99)

## 2017-11-07 NOTE — Progress Notes (Signed)
Internal Medicine Clinic Attending  Case discussed with Dr. Hetty Ely  soon after the resident saw the patient.  We reviewed the resident's history and exam and pertinent patient test results.  I agree with the assessment, diagnosis, and plan of care documented in the resident's note.  Oda Kilts, MD

## 2017-11-09 ENCOUNTER — Encounter (HOSPITAL_COMMUNITY): Payer: Medicare Other

## 2017-11-09 ENCOUNTER — Encounter (HOSPITAL_COMMUNITY)
Admission: RE | Admit: 2017-11-09 | Discharge: 2017-11-09 | Disposition: A | Discharge status: Home / Self Care | Payer: Medicare Other | Source: Ambulatory Visit | Attending: Cardiology | Admitting: Cardiology

## 2017-11-09 DIAGNOSIS — I208 Other forms of angina pectoris: Secondary | ICD-10-CM

## 2017-11-09 DIAGNOSIS — I110 Hypertensive heart disease with heart failure: Secondary | ICD-10-CM | POA: Diagnosis not present

## 2017-11-09 DIAGNOSIS — Z7982 Long term (current) use of aspirin: Secondary | ICD-10-CM | POA: Diagnosis not present

## 2017-11-09 DIAGNOSIS — Z794 Long term (current) use of insulin: Secondary | ICD-10-CM | POA: Diagnosis not present

## 2017-11-09 DIAGNOSIS — Z79899 Other long term (current) drug therapy: Secondary | ICD-10-CM | POA: Diagnosis not present

## 2017-11-09 DIAGNOSIS — E119 Type 2 diabetes mellitus without complications: Secondary | ICD-10-CM | POA: Diagnosis not present

## 2017-11-09 LAB — GLUCOSE, CAPILLARY: Glucose-Capillary: 86 mg/dL (ref 65–99)

## 2017-11-11 ENCOUNTER — Encounter (HOSPITAL_COMMUNITY): Payer: Medicare Other

## 2017-11-14 ENCOUNTER — Encounter (HOSPITAL_COMMUNITY)
Admission: RE | Admit: 2017-11-14 | Discharge: 2017-11-14 | Disposition: A | Discharge status: Home / Self Care | Payer: Medicare Other | Source: Ambulatory Visit | Attending: Cardiology | Admitting: Cardiology

## 2017-11-14 DIAGNOSIS — E119 Type 2 diabetes mellitus without complications: Secondary | ICD-10-CM | POA: Diagnosis not present

## 2017-11-14 DIAGNOSIS — I2089 Other forms of angina pectoris: Secondary | ICD-10-CM

## 2017-11-14 DIAGNOSIS — Z79899 Other long term (current) drug therapy: Secondary | ICD-10-CM | POA: Diagnosis not present

## 2017-11-14 DIAGNOSIS — Z7982 Long term (current) use of aspirin: Secondary | ICD-10-CM | POA: Diagnosis not present

## 2017-11-14 DIAGNOSIS — I208 Other forms of angina pectoris: Secondary | ICD-10-CM | POA: Diagnosis not present

## 2017-11-14 DIAGNOSIS — I110 Hypertensive heart disease with heart failure: Secondary | ICD-10-CM | POA: Diagnosis not present

## 2017-11-14 DIAGNOSIS — Z794 Long term (current) use of insulin: Secondary | ICD-10-CM | POA: Diagnosis not present

## 2017-11-14 NOTE — Progress Notes (Signed)
[name] 67 y.o. female DOB 1951/07/20 MRN [id number]       Nutrition  Dx: Stable Angina Meds reviewed. Toujeo, Metformin, Prandin noted   Suggested guidelines to decrease hypoglycemic medicine (toujeo  By 2 units or could also do temp decrease of prandin) on exercise days.    Labs:  Lipid Panel     Component Value Date/Time   CHOL 185 11/04/2017 1041   TRIG 78 11/04/2017 1041   HDL 57 11/04/2017 1041   CHOLHDL 3.2 11/04/2017 1041   CHOLHDL 3.7 10/17/2014 1106   VLDL 16 10/17/2014 1106   LDLCALC 112 (H) 11/04/2017 1041   LDLDIRECT 153 (H) 07/13/2011 1055    Lab Results  Component Value Date   HGBA1C 7.9 11/04/2017   CBG (last 3)  No results for input(s): GLUCAP in the last 72 hours. Note Spoke with pt. Nutrition Plan and Nutrition Survey goals reviewed with pt. Pt is following a Heart Healthy diet. Pt is working closely with [name], RD, CDE to help her manage DM. Pt's last A1c is improved from admission and continues to indicate blood glucose not optimally controlled. Pt checks CBG's 3-4 times a day. Fasting CBG's this am reportedly 197 mg/dL, "which was high for me." Pt states she ate fried chicken breast and thigh, slaw, and mashed potatoes with gravy at 3:45 pm yesterday on the way home from visiting her husband in [place]. Pt feels her fried chicken lunch/dinner meal contributed to this am's hyperglycemia. Per [name]'s note, pt given guidelines to decrease toujeo by 2 units or temporarily decrease prandin on exercise days. Pt denies having adjusted hypoglycemic medications at this time. Pre-and post-exercise CBG's reviewed. Pt educated re: beta blocker/decreased hypoglycemia awareness. Pt expressed understanding of the information reviewed. Pt aware of nutrition education classes offered plans on attending nutrition classes.  Nutrition Diagnosis ? Food-and nutrition-related knowledge deficit related to lack of exposure to information as related to  diagnosis of: ? CVD ? DM  ? Obesity related to excessive energy intake as evidenced by a BMI of 41.5  Nutrition Goal(s):  ? Pt to identify food quantities necessary to achieve weight loss of 6-24 lb (2.7-10.9 kg) at graduation from cardiac rehab. Goal wt of 60 lb desired.  ? Improved blood glucose control as evidenced by pt's A1c trending from 8.8 toward less than 7.0.- working toward goal; A1c improved to 7.9  Plan:  Pt to attend nutrition classes ? Nutrition I- met 11/08/17 ? Nutrition II ? Portion Distortion ? Diabetes Blitz ? Diabetes Q & A Will provide client-centered nutrition education as part of interdisciplinary care.   Monitor and evaluate progress toward nutrition goal with team.  [name], M.Ed, RD, LDN, CDE 11/14/2017 1:54 PM

## 2017-11-16 ENCOUNTER — Encounter (HOSPITAL_COMMUNITY)
Admission: RE | Admit: 2017-11-16 | Discharge: 2017-11-16 | Disposition: A | Discharge status: Home / Self Care | Payer: Medicare Other | Source: Ambulatory Visit | Attending: Cardiology | Admitting: Cardiology

## 2017-11-16 ENCOUNTER — Encounter (HOSPITAL_COMMUNITY): Payer: Medicare Other

## 2017-11-16 DIAGNOSIS — Z794 Long term (current) use of insulin: Secondary | ICD-10-CM | POA: Diagnosis not present

## 2017-11-16 DIAGNOSIS — E119 Type 2 diabetes mellitus without complications: Secondary | ICD-10-CM | POA: Diagnosis not present

## 2017-11-16 DIAGNOSIS — Z7982 Long term (current) use of aspirin: Secondary | ICD-10-CM | POA: Diagnosis not present

## 2017-11-16 DIAGNOSIS — I208 Other forms of angina pectoris: Secondary | ICD-10-CM

## 2017-11-16 DIAGNOSIS — I110 Hypertensive heart disease with heart failure: Secondary | ICD-10-CM | POA: Diagnosis not present

## 2017-11-16 DIAGNOSIS — Z79899 Other long term (current) drug therapy: Secondary | ICD-10-CM | POA: Diagnosis not present

## 2017-11-16 LAB — GLUCOSE, CAPILLARY: Glucose-Capillary: 130 mg/dL — ABNORMAL HIGH (ref 65–99)

## 2017-11-16 NOTE — Progress Notes (Signed)
Reviewed home exercise with pt today.  Pt plans to walk and go to apartment complex fitness center for exercise.  Reviewed THR, pulse, RPE, sign and symptoms, NTG use, and when to call 911 or MD.  Also discussed weather considerations and indoor options.  Pt voiced understanding.   Kecia Swoboda [employer]

## 2017-11-18 ENCOUNTER — Encounter (HOSPITAL_COMMUNITY): Payer: Medicare Other

## 2017-11-18 ENCOUNTER — Encounter (HOSPITAL_COMMUNITY)
Admission: RE | Admit: 2017-11-18 | Discharge: 2017-11-18 | Disposition: A | Discharge status: Home / Self Care | Payer: Medicare Other | Source: Ambulatory Visit | Attending: Cardiology | Admitting: Cardiology

## 2017-11-18 DIAGNOSIS — I110 Hypertensive heart disease with heart failure: Secondary | ICD-10-CM | POA: Diagnosis not present

## 2017-11-18 DIAGNOSIS — Z794 Long term (current) use of insulin: Secondary | ICD-10-CM | POA: Diagnosis not present

## 2017-11-18 DIAGNOSIS — Z7982 Long term (current) use of aspirin: Secondary | ICD-10-CM | POA: Diagnosis not present

## 2017-11-18 DIAGNOSIS — E119 Type 2 diabetes mellitus without complications: Secondary | ICD-10-CM | POA: Diagnosis not present

## 2017-11-18 DIAGNOSIS — I208 Other forms of angina pectoris: Secondary | ICD-10-CM

## 2017-11-18 DIAGNOSIS — Z79899 Other long term (current) drug therapy: Secondary | ICD-10-CM | POA: Diagnosis not present

## 2017-11-21 ENCOUNTER — Encounter (HOSPITAL_COMMUNITY)
Admission: RE | Admit: 2017-11-21 | Discharge: 2017-11-21 | Disposition: A | Discharge status: Home / Self Care | Payer: Medicare Other | Source: Ambulatory Visit | Attending: Cardiology | Admitting: Cardiology

## 2017-11-21 DIAGNOSIS — Z7982 Long term (current) use of aspirin: Secondary | ICD-10-CM | POA: Diagnosis not present

## 2017-11-21 DIAGNOSIS — I110 Hypertensive heart disease with heart failure: Secondary | ICD-10-CM | POA: Diagnosis not present

## 2017-11-21 DIAGNOSIS — E119 Type 2 diabetes mellitus without complications: Secondary | ICD-10-CM | POA: Diagnosis not present

## 2017-11-21 DIAGNOSIS — Z794 Long term (current) use of insulin: Secondary | ICD-10-CM | POA: Diagnosis not present

## 2017-11-21 DIAGNOSIS — I208 Other forms of angina pectoris: Secondary | ICD-10-CM

## 2017-11-21 DIAGNOSIS — Z79899 Other long term (current) drug therapy: Secondary | ICD-10-CM | POA: Diagnosis not present

## 2017-11-21 LAB — GLUCOSE, CAPILLARY: GLUCOSE-CAPILLARY: 105 mg/dL — AB (ref 65–99)

## 2017-11-23 ENCOUNTER — Encounter (HOSPITAL_COMMUNITY)
Admission: RE | Admit: 2017-11-23 | Discharge: 2017-11-23 | Disposition: A | Discharge status: Home / Self Care | Payer: Medicare Other | Source: Ambulatory Visit | Attending: Cardiology | Admitting: Cardiology

## 2017-11-23 ENCOUNTER — Encounter (HOSPITAL_COMMUNITY): Payer: Medicare Other

## 2017-11-23 DIAGNOSIS — E119 Type 2 diabetes mellitus without complications: Secondary | ICD-10-CM | POA: Diagnosis not present

## 2017-11-23 DIAGNOSIS — I110 Hypertensive heart disease with heart failure: Secondary | ICD-10-CM | POA: Diagnosis not present

## 2017-11-23 DIAGNOSIS — Z794 Long term (current) use of insulin: Secondary | ICD-10-CM | POA: Diagnosis not present

## 2017-11-23 DIAGNOSIS — Z7982 Long term (current) use of aspirin: Secondary | ICD-10-CM | POA: Diagnosis not present

## 2017-11-23 DIAGNOSIS — I208 Other forms of angina pectoris: Secondary | ICD-10-CM

## 2017-11-23 DIAGNOSIS — Z79899 Other long term (current) drug therapy: Secondary | ICD-10-CM | POA: Diagnosis not present

## 2017-11-24 ENCOUNTER — Ambulatory Visit: Payer: Medicare Other | Admitting: Dietician

## 2017-11-25 ENCOUNTER — Encounter (HOSPITAL_COMMUNITY): Payer: Medicare Other

## 2017-11-25 ENCOUNTER — Other Ambulatory Visit: Payer: Self-pay | Admitting: Cardiology

## 2017-11-25 ENCOUNTER — Encounter (HOSPITAL_COMMUNITY): Payer: Self-pay

## 2017-11-25 ENCOUNTER — Encounter (HOSPITAL_COMMUNITY)
Admission: RE | Admit: 2017-11-25 | Discharge: 2017-11-25 | Disposition: A | Discharge status: Home / Self Care | Payer: Medicare Other | Source: Ambulatory Visit | Attending: Cardiology | Admitting: Cardiology

## 2017-11-25 ENCOUNTER — Other Ambulatory Visit: Payer: Self-pay | Admitting: Internal Medicine

## 2017-11-25 DIAGNOSIS — E1140 Type 2 diabetes mellitus with diabetic neuropathy, unspecified: Secondary | ICD-10-CM

## 2017-11-25 DIAGNOSIS — I2089 Other forms of angina pectoris: Secondary | ICD-10-CM

## 2017-11-25 DIAGNOSIS — Z794 Long term (current) use of insulin: Principal | ICD-10-CM

## 2017-11-25 DIAGNOSIS — I208 Other forms of angina pectoris: Secondary | ICD-10-CM

## 2017-11-25 NOTE — Telephone Encounter (Signed)
REFILL 

## 2017-11-25 NOTE — Progress Notes (Signed)
Cardiac Individual Treatment Plan  Patient Details  Name: [name] MRN: [id number] Date of Birth: 29-Nov-1950 Referring Provider:     CARDIAC REHAB PHASE II ORIENTATION from 10/25/2017 in [place]  Referring Provider  Minus Breeding, MD.      Initial Encounter Date:    CARDIAC REHAB PHASE II ORIENTATION from 10/25/2017 in [place]  Date  10/25/17  Referring Provider  Minus Breeding, MD.      Visit Diagnosis: Stable angina [facility])  Patient's Home Medications on Admission:  Current Outpatient Medications:  .  ACCU-CHEK FASTCLIX LANCETS MISC, Check blood sugar 3 times a day, Disp: 102 each, Rfl: 5 .  amLODipine (NORVASC) 10 MG tablet, TAKE ONE (1) TABLET BY MOUTH EVERY DAY, Disp: 90 tablet, Rfl: 3 .  aspirin EC 81 MG tablet, Take 81 mg by mouth daily., Disp: , Rfl:  .  cetirizine (ZYRTEC) 10 MG tablet, Take 10 mg as needed by mouth for allergies., Disp: , Rfl:  .  Chromium 1000 MCG TABS, Take 1,000 mcg by mouth daily., Disp: , Rfl:  .  Coenzyme Q10 (CO Q 10 PO), Take 50 mg by mouth daily. , Disp: , Rfl:  .  ezetimibe (ZETIA) 10 MG tablet, TAKE ONE (1) TABLET BY MOUTH EVERY DAY PLEASE CALL TO SCHEDULE AN APPOINTMENT, Disp: 30 tablet, Rfl: 11 .  glucose blood (ACCU-CHEK SMARTVIEW) test strip, Check blood sugar 3 times a day, Disp: 100 each, Rfl: 5 .  isosorbide mononitrate (IMDUR) 120 MG 24 hr tablet, Take 2 tablets (240 mg total) by mouth every morning., Disp: 30 tablet, Rfl: 11 .  loratadine (CLARITIN) 10 MG tablet, TAKE ONE TABLET BY MOUTH ONCE DAILY FOR ALLERGIES, Disp: 90 tablet, Rfl: 3 .  losartan (COZAAR) 50 MG tablet, TAKE ONE (1) TABLET BY MOUTH EVERY DAY, Disp: 90 tablet, Rfl: 3 .  Lutein 6 MG TABS, Take 6 mg by mouth daily., Disp: , Rfl:  .  metFORMIN (GLUCOPHAGE) 1000 MG tablet, TAKE ONE (1) TABLET BY MOUTH TWO (2) TIMES DAILY WITH A MEAL, Disp: 180 tablet, Rfl: 3 .  metoprolol succinate (TOPROL-XL) 100  MG 24 hr tablet, TAKE ONE (1) TABLET BY MOUTH EVERY DAY (Patient taking differently: TAKE 100 gm TABLET BY MOUTH EVERY DAY), Disp: 90 tablet, Rfl: 3 .  nitroGLYCERIN (NITRODUR - DOSED IN MG/24 HR) 0.4 mg/hr patch, APPLY ONE PATCH ON SKIN EVERY DAY. LEAVE ON FOR 12 HOURS AND OFF FOR 12 HOURS., Disp: 100 patch, Rfl: 3 .  nitroGLYCERIN (NITROSTAT) 0.4 MG SL tablet, PLACE 1 TABLET UNDER THE TONGUE EVERY 5 MINUTES AS NEEDED FOR CHEST PAIN, Disp: 25 tablet, Rfl: 7 .  Olopatadine HCl (PATADAY) 0.2 % SOLN, Place 1 drop into both eyes daily as needed. For allergies, Disp: , Rfl:  .  OVER THE COUNTER MEDICATION, Take 1 tablet by mouth daily. SUPER VITAMIN B-COMPLEX (Vitamin B6 and B12), Disp: , Rfl:  .  repaglinide (PRANDIN) 2 MG tablet, Take 1 tablet (2 mg total) by mouth daily with breakfast AND 2 tablets (4 mg total) daily with lunch AND 2 tablets (4 mg total) daily with supper., Disp: 270 tablet, Rfl: 3 .  spironolactone (ALDACTONE) 25 MG tablet, Take 1 tablet (25 mg total) daily by mouth., Disp: 30 tablet, Rfl: 6 .  TOUJEO SOLOSTAR 300 UNIT/ML SOPN, INJECT 35 UNITS INTO THE SKIN DAILY, Disp: 12 mL, Rfl: 3 .  Turmeric Curcumin 500 MG CAPS, Take 500 mg by mouth  daily., Disp: , Rfl:   Past Medical History: Past Medical History:  Diagnosis Date  . Arthritis    "knees, lower back" (08/18/2017)  . 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 S/p lumpectomy and radiation.  Marland Kitchen CAD (coronary artery disease) 11/07   cath 11/11 :  3V CADsee report   . Cataract    small both eyes  . Chronic lower back pain   . Chronic pain    MR 08/07/10 :  Spondylosis L4-5 with B foraminal narrowing and L4 nerve root enchroachment B.  . Chronic venous insufficiency 2012   Has had full W/U incl ECHO, LFT's, Cr, Umicro, TSH, BNP. Symptomatic treatment.  . Colonic polyp 1995   Colonoscopies 1994, 2000, and 02/02/2011. Last had 9 polyps - Tubular adenoma and tubulovillous was the  pathology results without high grade dysplasia  . Diabetes mellitus type II    Insulin dependent. Has worked with [name] R previously.  . Diastolic CHF, chronic ([place])    NL EF by echo 01/2012, Gr I dd  . Essential hypertension    Requires 5 drug therapy and still difficult to control.  Marland Kitchen GERD (gastroesophageal reflux disease)   . Heart murmur   . Hyperlipidemia    On daily statin  . Iron deficiency anemia    Ferritin was 12 in 2012. on FeSo4. Has had colon and EGD 02/02/11 that showed mild gastritis and colon polyps.  . Obesity    Morbid. HAs worked with [name] T previously.  . OSA (obstructive sleep apnea) 02/16/2007   02/2007 : Moderate AHI 35.6, O2 sat decreased to 65%, CPAP titration to 16 with AHI of 3.6. 02/2014 : Rare respiratory events with sleep disturbance, within normal limits. AHI 0.4 per hour      . OSA on CPAP   . Personal history of radiation therapy    "to my left breast"  . Refusal of blood transfusions as patient is Jehovah's Witness   . Seasonal allergies    "grass pollen"    Tobacco Use: Social History   Tobacco Use  Smoking Status Never Smoker  Smokeless Tobacco Never Used    Labs: Recent Review Flowsheet Data    Labs for ITP Cardiac and Pulmonary Rehab Latest Ref Rng & Units 11/04/2016 03/01/2017 06/09/2017 08/19/2017 11/04/2017   Cholestrol 100 - 199 mg/dL 239(H) - - - 185   LDLCALC 0 - 99 mg/dL 161(H) - - - 112(H)   LDLDIRECT mg/dL - - - - -   HDL >39 mg/dL 60 - - - 57   Trlycerides 0 - 149 mg/dL 90 - - - 78   Hemoglobin A1c - 7.3 8.6 9.0 8.8(H) 7.9   TCO2 0 - 100 mmol/L - - - - -      Capillary Blood Glucose: Lab Results  Component Value Date   GLUCAP 105 (H) 11/21/2017   GLUCAP 130 (H) 11/16/2017   GLUCAP 86 11/09/2017   GLUCAP 164 (H) 11/07/2017   GLUCAP 175 (H) 11/04/2017     Exercise Target Goals:    Exercise Program Goal: Individual exercise prescription set using results from initial 6 min walk test and THRR while considering  patient's  activity barriers and safety.   Exercise Prescription Goal: Initial exercise prescription builds to 30-45 minutes a day of aerobic activity, 2-3 days per week.  Home exercise guidelines will be given to patient during program as part of exercise prescription that the participant will acknowledge.  Activity Barriers & Risk Stratification: Activity Barriers & Cardiac Risk Stratification - 10/25/17 0839      Activity Barriers & Cardiac Risk Stratification   Activity Barriers  Arthritis;History of Falls;Assistive Device;Balance Concerns    Cardiac Risk Stratification  High       6 Minute Walk: 6 Minute Walk    Row Name 10/25/17 1037         6 Minute Walk   Phase  Initial     Distance  1067 feet     Walk Time  6 minutes     # of Rest Breaks  0     MPH  2.02     METS  2.08     RPE  8     VO2 Peak  7.27     Symptoms  No     Resting HR  66 bpm     Resting BP  128/70     Resting Oxygen Saturation   97 %     Exercise Oxygen Saturation  during 6 min walk  98 %     Max Ex. HR  101 bpm     Max Ex. BP  150/78     2 Minute Post BP  128/62        Oxygen Initial Assessment:   Oxygen Re-Evaluation:   Oxygen Discharge (Final Oxygen Re-Evaluation):   Initial Exercise Prescription: Initial Exercise Prescription - 10/25/17 1000      Date of Initial Exercise RX and Referring Provider   Date  10/25/17    Referring Provider  Minus Breeding, MD.      Bike   Level  0.6    Minutes  10    METs  2.13      NuStep   Level  3    SPM  85    Minutes  10    METs  2.2      Track   Laps  8    Minutes  10    METs  2.39      Prescription Details   Frequency (times per week)  3    Duration  Progress to 30 minutes of continuous aerobic without signs/symptoms of physical distress      Intensity   THRR 40-80% of Max Heartrate  62-123    Ratings of Perceived Exertion  11-13    Perceived Dyspnea  0-4      Progression   Progression  Continue to progress workloads to maintain  intensity without signs/symptoms of physical distress.      Resistance Training   Training Prescription  Yes    Weight  3lbs    Reps  10-15       Perform Capillary Blood Glucose checks as needed.  Exercise Prescription Changes: Exercise Prescription Changes    Row Name 11/02/17 1600 11/07/17 1719 11/21/17 1515         Response to Exercise   Blood Pressure (Admit)  108/62  130/60  130/58     Blood Pressure (Exercise)  122/78  118/70  124/70     Blood Pressure (Exit)  122/78  130/60  128/60     Heart Rate (Admit)  99 bpm  83 bpm  85 bpm     Heart Rate (Exercise)  135 bpm  131 bpm  113 bpm     Heart Rate (Exit)  98 bpm  91 bpm  83 bpm     Symptoms  none  none  none  Comments  pt oriented to exercise equipment today  pt oriented to exercise equipment today  -     Duration  Continue with 30 min of aerobic exercise without signs/symptoms of physical distress.  Continue with 30 min of aerobic exercise without signs/symptoms of physical distress.  Continue with 30 min of aerobic exercise without signs/symptoms of physical distress.     Intensity  THRR unchanged  THRR unchanged  THRR unchanged       Progression   Progression  Continue to progress workloads to maintain intensity without signs/symptoms of physical distress.  Continue to progress workloads to maintain intensity without signs/symptoms of physical distress.  Continue to progress workloads to maintain intensity without signs/symptoms of physical distress.     Average METs  2.4  2.1  2.3       Resistance Training   Training Prescription  No relaxation day  No  Yes     Weight  -  -  5lbs     Reps  -  -  10-15     Time  -  -  10 Minutes       NuStep   Level  3  -  4     SPM  75  -  85     Minutes  15  -  10     METs  1.9  -  2.4       Arm Ergometer   Level  1  1  -     Minutes  10  15  -     METs  2.8  2.3  -       Track   Laps  8  8  16      Minutes  10  15  20      METs  2.39  2  2.3       Home Exercise Plan    Plans to continue exercise at  -  -  Home (comment) walking     Frequency  -  -  Add 2 additional days to program exercise sessions.     Initial Home Exercises Provided  -  -  11/16/16        Exercise Comments: Exercise Comments    Row Name 11/02/17 1629 11/18/17 1207         Exercise Comments  Pt responded well to first exercise session at cardiac rehab. Pt denied any symptoms of SOB, CP and dizziness. Pt will continue to be monitor and will progress exercise as tolerated.  Reviewed HEP and activity levels. Pt is progressing well in cardiac rehab; will continue to monitor and progress exercise as tolerated         Exercise Goals and Review: Exercise Goals    Row Name 10/25/17 1040             Exercise Goals   Increase Physical Activity  Yes       Intervention  Provide advice, education, support and counseling about physical activity/exercise needs.;Develop an individualized exercise prescription for aerobic and resistive training based on initial evaluation findings, risk stratification, comorbidities and participant's personal goals.       Expected Outcomes  Short Term: Attend rehab on a regular basis to increase amount of physical activity.;Long Term: Exercising regularly at least 3-5 days a week.;Long Term: Add in home exercise to make exercise part of routine and to increase amount of physical activity.       Increase Strength and Stamina  Yes  Intervention  Provide advice, education, support and counseling about physical activity/exercise needs.;Develop an individualized exercise prescription for aerobic and resistive training based on initial evaluation findings, risk stratification, comorbidities and participant's personal goals.       Expected Outcomes  Short Term: Increase workloads from initial exercise prescription for resistance, speed, and METs.;Short Term: Perform resistance training exercises routinely during rehab and add in resistance training at home;Long  Term: Improve cardiorespiratory fitness, muscular endurance and strength as measured by increased METs and functional capacity (6MWT)       Able to understand and use rate of perceived exertion (RPE) scale  Yes       Intervention  Provide education and explanation on how to use RPE scale       Expected Outcomes  Short Term: Able to use RPE daily in rehab to express subjective intensity level;Long Term:  Able to use RPE to guide intensity level when exercising independently       Able to understand and use Dyspnea scale  Yes       Intervention  Provide education and explanation on how to use Dyspnea scale       Expected Outcomes  Short Term: Able to use Dyspnea scale daily in rehab to express subjective sense of shortness of breath during exertion;Long Term: Able to use Dyspnea scale to guide intensity level when exercising independently       Knowledge and understanding of Target Heart Rate Range (THRR)  Yes       Intervention  Provide education and explanation of THRR including how the numbers were predicted and where they are located for reference       Expected Outcomes  Short Term: Able to state/look up THRR;Long Term: Able to use THRR to govern intensity when exercising independently;Short Term: Able to use daily as guideline for intensity in rehab       Able to check pulse independently  Yes       Intervention  Provide education and demonstration on how to check pulse in carotid and radial arteries.;Review the importance of being able to check your own pulse for safety during independent exercise       Expected Outcomes  Short Term: Able to explain why pulse checking is important during independent exercise;Long Term: Able to check pulse independently and accurately       Understanding of Exercise Prescription  Yes       Intervention  Provide education, explanation, and written materials on patient's individual exercise prescription       Expected Outcomes  Short Term: Able to explain program  exercise prescription;Long Term: Able to explain home exercise prescription to exercise independently          Exercise Goals Re-Evaluation : Exercise Goals Re-Evaluation    [place] Name 11/16/17 1445 11/22/17 1519           Exercise Goal Re-Evaluation   Exercise Goals Review  Increase Physical Activity;Able to understand and use rate of perceived exertion (RPE) scale;Knowledge and understanding of Target Heart Rate Range (THRR);Understanding of Exercise Prescription;Increase Strength and Stamina;Able to check pulse independently  Increase Physical Activity;Able to understand and use rate of perceived exertion (RPE) scale;Knowledge and understanding of Target Heart Rate Range (THRR);Understanding of Exercise Prescription;Increase Strength and Stamina;Able to check pulse independently      Comments  Reviewed home exercise with pt today.  Pt plans to walk and go to apartment complex fitness center for exercise.  Reviewed THR, pulse, RPE, sign and symptoms, NTG  use, and when to call 911 or MD.  Also discussed weather considerations and indoor options.  Pt voiced understanding.  Reviewed home exercise with pt today.  Pt plans to walk and go to apartment complex fitness center for exercise.  Reviewed THR, pulse, RPE, sign and symptoms, NTG use, and when to call 911 or MD.  Also discussed weather considerations and indoor options.  Pt voiced understanding.      Expected Outcomes  Pt will be compliant with HEP and will continue to improve in cardiorespiratory fitness  Pt will be compliant with HEP and will continue to improve in cardiorespiratory fitness          Discharge Exercise Prescription (Final Exercise Prescription Changes): Exercise Prescription Changes - 11/21/17 1515      Response to Exercise   Blood Pressure (Admit)  130/58    Blood Pressure (Exercise)  124/70    Blood Pressure (Exit)  128/60    Heart Rate (Admit)  85 bpm    Heart Rate (Exercise)  113 bpm    Heart Rate (Exit)  83 bpm     Symptoms  none    Duration  Continue with 30 min of aerobic exercise without signs/symptoms of physical distress.    Intensity  THRR unchanged      Progression   Progression  Continue to progress workloads to maintain intensity without signs/symptoms of physical distress.    Average METs  2.3      Resistance Training   Training Prescription  Yes    Weight  5lbs    Reps  10-15    Time  10 Minutes      NuStep   Level  4    SPM  85    Minutes  10    METs  2.4      Track   Laps  16    Minutes  20    METs  2.3      Home Exercise Plan   Plans to continue exercise at  Home (comment) walking    Frequency  Add 2 additional days to program exercise sessions.    Initial Home Exercises Provided  11/16/16       Nutrition:  Target Goals: Understanding of nutrition guidelines, daily intake of sodium 1500mg , cholesterol 200mg , calories 30% from fat and 7% or less from saturated fats, daily to have 5 or more servings of fruits and vegetables.  Biometrics: Pre Biometrics - 10/25/17 0807      Pre Biometrics   Height  5\' 1"  (1.549 m)    Weight  219 lb 2.2 oz (99.4 kg)    Waist Circumference  41.5 inches    Hip Circumference  52.25 inches    Waist to Hip Ratio  0.79 %    BMI (Calculated)  41.43    Triceps Skinfold  53 mm    % Body Fat  52.8 %    Grip Strength  33 kg    Flexibility  0 in    Single Leg Stand  4.11 seconds        Nutrition Therapy Plan and Nutrition Goals: Nutrition Therapy & Goals - 11/14/17 1419      Nutrition Therapy   Diet  Carb Modified, Heart Healthy      Personal Nutrition Goals   Nutrition Goal  Improved glycemic control as evidenced by pt's A1c trending from 8.8 to 7 or less.     Personal Goal #2  Pt to identify food quantities necessary to  achieve weight loss of 6-24 lb (2.7-10.9 kg) at graduation from cardiac rehab. Goal wt of 60 lb desired.      Intervention Plan   Intervention  Prescribe, educate and counsel regarding individualized specific  dietary modifications aiming towards targeted core components such as weight, hypertension, lipid management, diabetes, heart failure and other comorbidities.    Expected Outcomes  Short Term Goal: Understand basic principles of dietary content, such as calories, fat, sodium, cholesterol and nutrients.;Long Term Goal: Adherence to prescribed nutrition plan.       Nutrition Assessments: Nutrition Assessments - 11/14/17 1419      MEDFICTS Scores   Pre Score  23       Nutrition Goals Re-Evaluation:   Nutrition Goals Re-Evaluation:   Nutrition Goals Discharge (Final Nutrition Goals Re-Evaluation):   Psychosocial: Target Goals: Acknowledge presence or absence of significant depression and/or stress, maximize coping skills, provide positive support system. Participant is able to verbalize types and ability to use techniques and skills needed for reducing stress and depression.  Initial Review & Psychosocial Screening: Initial Psych Review & Screening - 10/25/17 1125      Initial Review   Current issues with  Current Stress Concerns    Source of Stress Concerns  Chronic Illness      Family Dynamics   Good Support System?  Yes      Barriers   Psychosocial barriers to participate in program  The patient should benefit from training in stress management and relaxation.      Screening Interventions   Interventions  Encouraged to exercise    Expected Outcomes  Short Term goal: Utilizing psychosocial counselor, staff and physician to assist with identification of specific Stressors or current issues interfering with healing process. Setting desired goal for each stressor or current issue identified.;Long Term Goal: Stressors or current issues are controlled or eliminated.       Quality of Life Scores: Quality of Life - 11/21/17 1615      Quality of Life Scores   Health/Function Pre  26.43 %    Socioeconomic Pre  29.58 %    Psych/Spiritual Pre  30 %    Family Pre  27.5 %     GLOBAL Pre  28 % overall scores very good.  pt demonstrates positve outlook with good coping skills. pt has strong jehovahs witness faith base      Scores of 19 and below usually indicate a poorer quality of life in these areas.  A difference of  2-3 points is a clinically meaningful difference.  A difference of 2-3 points in the total score of the Quality of Life Index has been associated with significant improvement in overall quality of life, self-image, physical symptoms, and general health in studies assessing change in quality of life.  PHQ-9: Recent Review Flowsheet Data    Depression screen [facility] 2/9 11/04/2017 11/02/2017 10/27/2017 09/15/2017 06/09/2017   Decreased Interest 0 0 0 0 0   Down, Depressed, Hopeless 0 0 0 0 0   PHQ - 2 Score 0 0 0 0 0     Interpretation of Total Score  Total Score Depression Severity:  1-4 = Minimal depression, 5-9 = Mild depression, 10-14 = Moderate depression, 15-19 = Moderately severe depression, 20-27 = Severe depression   Psychosocial Evaluation and Intervention: Psychosocial Evaluation - 11/02/17 1502      Psychosocial Evaluation & Interventions   Interventions  Stress management education;Encouraged to exercise with the program and follow exercise prescription;Relaxation education  Comments  pt with transportation and financial barriers.  pt has arranged for Medicaid transport. pt daughter accompanies her to appointments for assistance.  pt has strong Jehovahs Witness faith base.      Expected Outcomes  pt will exhibit positive outlook with good coping skills.     Continue Psychosocial Services   Follow up required by staff       Psychosocial Re-Evaluation: Psychosocial Re-Evaluation    [place] Name 11/16/17 1112             Psychosocial Re-Evaluation   Current issues with  None Identified       Comments  pt managing transportation without difficulty. pt dtr accompanies her to rehab.        Expected Outcomes  pt will exhibit improved  outlook  and coping skills.        Interventions  Encouraged to attend Cardiac Rehabilitation for the exercise       Continue Psychosocial Services   Follow up required by staff          Psychosocial Discharge (Final Psychosocial Re-Evaluation): Psychosocial Re-Evaluation - 11/16/17 1112      Psychosocial Re-Evaluation   Current issues with  None Identified    Comments  pt managing transportation without difficulty. pt dtr accompanies her to rehab.     Expected Outcomes  pt will exhibit improved  outlook and coping skills.     Interventions  Encouraged to attend Cardiac Rehabilitation for the exercise    Continue Psychosocial Services   Follow up required by staff       Vocational Rehabilitation: Provide vocational rehab assistance to qualifying candidates.   Vocational Rehab Evaluation & Intervention: Vocational Rehab - 11/02/17 1628      Initial Vocational Rehab Evaluation & Intervention   Assessment shows need for Vocational Rehabilitation  No       Education: Education Goals: Education classes will be provided on a weekly basis, covering required topics. Participant will state understanding/return demonstration of topics presented.  Learning Barriers/Preferences: Learning Barriers/Preferences - 10/25/17 1042      Learning Barriers/Preferences   Learning Barriers  Sight    Learning Preferences  Written Material       Education Topics: Count Your Pulse:  -Group instruction provided by verbal instruction, demonstration, patient participation and written materials to support subject.  Instructors address importance of being able to find your pulse and how to count your pulse when at home without a heart monitor.  Patients get hands on experience counting their pulse with staff help and individually.   Heart Attack, Angina, and Risk Factor Modification:  -Group instruction provided by verbal instruction, video, and written materials to support subject.  Instructors address signs  and symptoms of angina and heart attacks.    Also discuss risk factors for heart disease and how to make changes to improve heart health risk factors.   Functional Fitness:  -Group instruction provided by verbal instruction, demonstration, patient participation, and written materials to support subject.  Instructors address safety measures for doing things around the house.  Discuss how to get up and down off the floor, how to pick things up properly, how to safely get out of a chair without assistance, and balance training.   Meditation and Mindfulness:  -Group instruction provided by verbal instruction, patient participation, and written materials to support subject.  Instructor addresses importance of mindfulness and meditation practice to help reduce stress and improve awareness.  Instructor also leads participants through a meditation exercise.  Stretching for Flexibility and Mobility:  -Group instruction provided by verbal instruction, patient participation, and written materials to support subject.  Instructors lead participants through series of stretches that are designed to increase flexibility thus improving mobility.  These stretches are additional exercise for major muscle groups that are typically performed during regular warm up and cool down.   Hands Only CPR:  -Group verbal, video, and participation provides a basic overview of AHA guidelines for community CPR. Role-play of emergencies allow participants the opportunity to practice calling for help and chest compression technique with discussion of AED use.   Hypertension: -Group verbal and written instruction that provides a basic overview of hypertension including the most recent diagnostic guidelines, risk factor reduction with self-care instructions and medication management.    Nutrition I class: Heart Healthy Eating:  -Group instruction provided by PowerPoint slides, verbal discussion, and written materials to  support subject matter. The instructor gives an explanation and review of the Therapeutic Lifestyle Changes diet recommendations, which includes a discussion on lipid goals, dietary fat, sodium, fiber, plant stanol/sterol esters, sugar, and the components of a well-balanced, healthy diet.   CARDIAC REHAB PHASE II EXERCISE from 11/14/2017 in [place]  Date  11/08/17  Educator  RD  Instruction Review Code  2- Demonstrated Understanding      Nutrition II class: Lifestyle Skills:  -Group instruction provided by PowerPoint slides, verbal discussion, and written materials to support subject matter. The instructor gives an explanation and review of label reading, grocery shopping for heart health, heart healthy recipe modifications, and ways to make healthier choices when eating out.   Diabetes Question & Answer:  -Group instruction provided by PowerPoint slides, verbal discussion, and written materials to support subject matter. The instructor gives an explanation and review of diabetes co-morbidities, pre- and post-prandial blood glucose goals, pre-exercise blood glucose goals, signs, symptoms, and treatment of hypoglycemia and hyperglycemia, and foot care basics.   Diabetes Blitz:  -Group instruction provided by PowerPoint slides, verbal discussion, and written materials to support subject matter. The instructor gives an explanation and review of the physiology behind type 1 and type 2 diabetes, diabetes medications and rational behind using different medications, pre- and post-prandial blood glucose recommendations and Hemoglobin A1c goals, diabetes diet, and exercise including blood glucose guidelines for exercising safely.    CARDIAC REHAB PHASE II EXERCISE from 11/14/2017 in [place]  Date  11/08/17 Einar Gip error,  Heart Healthy class taught not Diabetes Blitz]      Portion Distortion:  -Group instruction provided by PowerPoint  slides, verbal discussion, written materials, and food models to support subject matter. The instructor gives an explanation of serving size versus portion size, changes in portions sizes over the last 20 years, and what consists of a serving from each food group.   Stress Management:  -Group instruction provided by verbal instruction, video, and written materials to support subject matter.  Instructors review role of stress in heart disease and how to cope with stress positively.     Exercising on Your Own:  -Group instruction provided by verbal instruction, power point, and written materials to support subject.  Instructors discuss benefits of exercise, components of exercise, frequency and intensity of exercise, and end points for exercise.  Also discuss use of nitroglycerin and activating EMS.  Review options of places to exercise outside of rehab.  Review guidelines for sex with heart disease.   Cardiac Drugs I:  -Group instruction provided by  verbal instruction and written materials to support subject.  Instructor reviews cardiac drug classes: antiplatelets, anticoagulants, beta blockers, and statins.  Instructor discusses reasons, side effects, and lifestyle considerations for each drug class.   Cardiac Drugs II:  -Group instruction provided by verbal instruction and written materials to support subject.  Instructor reviews cardiac drug classes: angiotensin converting enzyme inhibitors (ACE-I), angiotensin II receptor blockers (ARBs), nitrates, and calcium channel blockers.  Instructor discusses reasons, side effects, and lifestyle considerations for each drug class.   Anatomy and Physiology of the Circulatory System:  Group verbal and written instruction and models provide basic cardiac anatomy and physiology, with the coronary electrical and arterial systems. Review of: AMI, Angina, Valve disease, Heart Failure, Peripheral Artery Disease, Cardiac Arrhythmia, Pacemakers, and the  ICD.   Other Education:  -Group or individual verbal, written, or video instructions that support the educational goals of the cardiac rehab program.   Holiday Eating Survival Tips:  -Group instruction provided by PowerPoint slides, verbal discussion, and written materials to support subject matter. The instructor gives patients tips, tricks, and techniques to help them not only survive but enjoy the holidays despite the onslaught of food that accompanies the holidays.   Knowledge Questionnaire Score: Knowledge Questionnaire Score - 11/02/17 1458      Knowledge Questionnaire Score   Pre Score  19/24  (Pended)        Core Components/Risk Factors/Patient Goals at Admission: Personal Goals and Risk Factors at Admission - 10/25/17 1035      Core Components/Risk Factors/Patient Goals on Admission    Weight Management  Obesity;Yes    Intervention  Weight Management/Obesity: Establish reasonable short term and long term weight goals.;Obesity: Provide education and appropriate resources to help participant work on and attain dietary goals.    Admit Weight  219 lb 2.2 oz (99.4 kg)    Expected Outcomes  Weight Loss: Understanding of general recommendations for a balanced deficit meal plan, which promotes 1-2 lb weight loss per week and includes a negative energy balance of [phone] kcal/d    Diabetes  Yes    Intervention  Provide education about signs/symptoms and action to take for hypo/hyperglycemia.;Provide education about proper nutrition, including hydration, and aerobic/resistive exercise prescription along with prescribed medications to achieve blood glucose in normal ranges: Fasting glucose 65-99 mg/dL    Expected Outcomes  Short Term: Participant verbalizes understanding of the signs/symptoms and immediate care of hyper/hypoglycemia, proper foot care and importance of medication, aerobic/resistive exercise and nutrition plan for blood glucose control.;Long Term: Attainment of HbA1C < 7%.     Hypertension  Yes    Intervention  Provide education on lifestyle modifcations including regular physical activity/exercise, weight management, moderate sodium restriction and increased consumption of fresh fruit, vegetables, and low fat dairy, alcohol moderation, and smoking cessation.;Monitor prescription use compliance.    Expected Outcomes  Short Term: Continued assessment and intervention until BP is < 140/68mm HG in hypertensive participants. < 130/4mm HG in hypertensive participants with diabetes, heart failure or chronic kidney disease.;Long Term: Maintenance of blood pressure at goal levels.    Lipids  Yes    Intervention  Provide education and support for participant on nutrition & aerobic/resistive exercise along with prescribed medications to achieve LDL 70mg , HDL >40mg .    Expected Outcomes  Short Term: Participant states understanding of desired cholesterol values and is compliant with medications prescribed. Participant is following exercise prescription and nutrition guidelines.;Long Term: Cholesterol controlled with medications as prescribed, with individualized exercise RX and with personalized  nutrition plan. Value goals: LDL < 70mg , HDL > 40 mg.       Core Components/Risk Factors/Patient Goals Review:  Goals and Risk Factor Review    Row Name 11/02/17 1500 11/16/17 1109 11/18/17 1208         Core Components/Risk Factors/Patient Goals Review   Personal Goals Review  Diabetes;Weight Management/Obesity;Hypertension;Lipids  Diabetes;Weight Management/Obesity;Hypertension;Lipids  -     Review  pt with multiple CAD RF demonstrates willingness to participate in CR program.   pt with multiple CAD RF demonstrates willingness to participate in CR program. pt demonstrates good blood pressure and blood sugar control at cardiac rehab.  -     Expected Outcomes  pt will participate in CR exercise, nutrition and lifestyle modification education to decrease overall RF.    pt will  participate in CR exercise, nutrition and lifestyle modification education to decrease overall RF.    -        Core Components/Risk Factors/Patient Goals at Discharge (Final Review):  Goals and Risk Factor Review - 11/18/17 1208      Core Components/Risk Factors/Patient Goals Review   Personal Goals Review  --    Review  --    Expected Outcomes  --       ITP Comments: ITP Comments    Row Name 10/25/17 1035 11/02/17 1458 11/16/17 1107 11/18/17 1208     ITP Comments  Medical Director- Dr. Fransico Him, MD.  pt started group exercise sessions today. pt tolerated light activity without difficulty.   30 day ITP review. pt demonstrates eagerness to participate in CR opportunties.   30 day ITP review. pt demonstrates eagerness to participate in CR opportunties.        Comments:

## 2017-11-28 ENCOUNTER — Encounter (HOSPITAL_COMMUNITY)
Admission: RE | Admit: 2017-11-28 | Discharge: 2017-11-28 | Disposition: A | Discharge status: Home / Self Care | Payer: Medicare Other | Source: Ambulatory Visit | Attending: Cardiology | Admitting: Cardiology

## 2017-11-28 DIAGNOSIS — I208 Other forms of angina pectoris: Secondary | ICD-10-CM | POA: Diagnosis not present

## 2017-11-28 DIAGNOSIS — I110 Hypertensive heart disease with heart failure: Secondary | ICD-10-CM | POA: Diagnosis not present

## 2017-11-28 DIAGNOSIS — Z79899 Other long term (current) drug therapy: Secondary | ICD-10-CM | POA: Diagnosis not present

## 2017-11-28 DIAGNOSIS — E119 Type 2 diabetes mellitus without complications: Secondary | ICD-10-CM | POA: Diagnosis not present

## 2017-11-28 DIAGNOSIS — Z794 Long term (current) use of insulin: Secondary | ICD-10-CM | POA: Diagnosis not present

## 2017-11-28 DIAGNOSIS — Z7982 Long term (current) use of aspirin: Secondary | ICD-10-CM | POA: Diagnosis not present

## 2017-11-29 LAB — GLUCOSE, CAPILLARY: GLUCOSE-CAPILLARY: 190 mg/dL — AB (ref 65–99)

## 2017-11-30 ENCOUNTER — Encounter (HOSPITAL_COMMUNITY): Payer: Medicare Other

## 2017-11-30 ENCOUNTER — Encounter (HOSPITAL_COMMUNITY)
Admission: RE | Admit: 2017-11-30 | Discharge: 2017-11-30 | Disposition: A | Discharge status: Home / Self Care | Payer: Medicare Other | Source: Ambulatory Visit | Attending: Cardiology | Admitting: Cardiology

## 2017-11-30 DIAGNOSIS — Z794 Long term (current) use of insulin: Secondary | ICD-10-CM | POA: Diagnosis not present

## 2017-11-30 DIAGNOSIS — I208 Other forms of angina pectoris: Secondary | ICD-10-CM

## 2017-11-30 DIAGNOSIS — I110 Hypertensive heart disease with heart failure: Secondary | ICD-10-CM | POA: Diagnosis not present

## 2017-11-30 DIAGNOSIS — E119 Type 2 diabetes mellitus without complications: Secondary | ICD-10-CM | POA: Diagnosis not present

## 2017-11-30 DIAGNOSIS — Z79899 Other long term (current) drug therapy: Secondary | ICD-10-CM | POA: Diagnosis not present

## 2017-11-30 DIAGNOSIS — Z7982 Long term (current) use of aspirin: Secondary | ICD-10-CM | POA: Diagnosis not present

## 2017-11-30 LAB — GLUCOSE, CAPILLARY: Glucose-Capillary: 259 mg/dL — ABNORMAL HIGH (ref 65–99)

## 2017-12-02 ENCOUNTER — Encounter (HOSPITAL_COMMUNITY)
Admission: RE | Admit: 2017-12-02 | Discharge: 2017-12-02 | Disposition: A | Discharge status: Home / Self Care | Payer: Medicare Other | Source: Ambulatory Visit | Attending: Cardiology | Admitting: Cardiology

## 2017-12-02 ENCOUNTER — Encounter (HOSPITAL_COMMUNITY): Payer: Medicare Other

## 2017-12-02 DIAGNOSIS — I208 Other forms of angina pectoris: Secondary | ICD-10-CM | POA: Diagnosis not present

## 2017-12-02 DIAGNOSIS — Z79899 Other long term (current) drug therapy: Secondary | ICD-10-CM | POA: Diagnosis not present

## 2017-12-02 DIAGNOSIS — Z7982 Long term (current) use of aspirin: Secondary | ICD-10-CM | POA: Diagnosis not present

## 2017-12-02 DIAGNOSIS — I110 Hypertensive heart disease with heart failure: Secondary | ICD-10-CM | POA: Diagnosis not present

## 2017-12-02 DIAGNOSIS — I5032 Chronic diastolic (congestive) heart failure: Secondary | ICD-10-CM | POA: Insufficient documentation

## 2017-12-02 DIAGNOSIS — E785 Hyperlipidemia, unspecified: Secondary | ICD-10-CM | POA: Diagnosis not present

## 2017-12-02 DIAGNOSIS — G4733 Obstructive sleep apnea (adult) (pediatric): Secondary | ICD-10-CM | POA: Insufficient documentation

## 2017-12-02 DIAGNOSIS — K219 Gastro-esophageal reflux disease without esophagitis: Secondary | ICD-10-CM | POA: Insufficient documentation

## 2017-12-02 DIAGNOSIS — E119 Type 2 diabetes mellitus without complications: Secondary | ICD-10-CM | POA: Diagnosis not present

## 2017-12-02 DIAGNOSIS — Z794 Long term (current) use of insulin: Secondary | ICD-10-CM | POA: Diagnosis not present

## 2017-12-02 DIAGNOSIS — I2089 Other forms of angina pectoris: Secondary | ICD-10-CM

## 2017-12-05 ENCOUNTER — Encounter (HOSPITAL_COMMUNITY)
Admission: RE | Admit: 2017-12-05 | Discharge: 2017-12-05 | Disposition: A | Discharge status: Home / Self Care | Payer: Medicare Other | Source: Ambulatory Visit | Attending: Cardiology | Admitting: Cardiology

## 2017-12-05 DIAGNOSIS — Z794 Long term (current) use of insulin: Secondary | ICD-10-CM | POA: Diagnosis not present

## 2017-12-05 DIAGNOSIS — Z79899 Other long term (current) drug therapy: Secondary | ICD-10-CM | POA: Diagnosis not present

## 2017-12-05 DIAGNOSIS — I110 Hypertensive heart disease with heart failure: Secondary | ICD-10-CM | POA: Diagnosis not present

## 2017-12-05 DIAGNOSIS — I208 Other forms of angina pectoris: Secondary | ICD-10-CM

## 2017-12-05 DIAGNOSIS — E119 Type 2 diabetes mellitus without complications: Secondary | ICD-10-CM | POA: Diagnosis not present

## 2017-12-05 DIAGNOSIS — Z7982 Long term (current) use of aspirin: Secondary | ICD-10-CM | POA: Diagnosis not present

## 2017-12-06 LAB — GLUCOSE, CAPILLARY: GLUCOSE-CAPILLARY: 173 mg/dL — AB (ref 65–99)

## 2017-12-07 ENCOUNTER — Encounter (HOSPITAL_COMMUNITY): Payer: Medicare Other

## 2017-12-07 ENCOUNTER — Encounter (HOSPITAL_COMMUNITY)
Admission: RE | Admit: 2017-12-07 | Discharge: 2017-12-07 | Disposition: A | Discharge status: Home / Self Care | Payer: Medicare Other | Source: Ambulatory Visit | Attending: Cardiology | Admitting: Cardiology

## 2017-12-07 DIAGNOSIS — Z7982 Long term (current) use of aspirin: Secondary | ICD-10-CM | POA: Diagnosis not present

## 2017-12-07 DIAGNOSIS — Z794 Long term (current) use of insulin: Secondary | ICD-10-CM | POA: Diagnosis not present

## 2017-12-07 DIAGNOSIS — I208 Other forms of angina pectoris: Secondary | ICD-10-CM | POA: Diagnosis not present

## 2017-12-07 DIAGNOSIS — Z79899 Other long term (current) drug therapy: Secondary | ICD-10-CM | POA: Diagnosis not present

## 2017-12-07 DIAGNOSIS — I110 Hypertensive heart disease with heart failure: Secondary | ICD-10-CM | POA: Diagnosis not present

## 2017-12-07 DIAGNOSIS — E119 Type 2 diabetes mellitus without complications: Secondary | ICD-10-CM | POA: Diagnosis not present

## 2017-12-09 ENCOUNTER — Encounter (HOSPITAL_COMMUNITY): Payer: Medicare Other

## 2017-12-12 ENCOUNTER — Encounter (HOSPITAL_COMMUNITY)
Admission: RE | Admit: 2017-12-12 | Discharge: 2017-12-12 | Disposition: A | Discharge status: Home / Self Care | Payer: Medicare Other | Source: Ambulatory Visit | Attending: Cardiology | Admitting: Cardiology

## 2017-12-12 DIAGNOSIS — I2089 Other forms of angina pectoris: Secondary | ICD-10-CM

## 2017-12-12 DIAGNOSIS — E119 Type 2 diabetes mellitus without complications: Secondary | ICD-10-CM | POA: Diagnosis not present

## 2017-12-12 DIAGNOSIS — I110 Hypertensive heart disease with heart failure: Secondary | ICD-10-CM | POA: Diagnosis not present

## 2017-12-12 DIAGNOSIS — Z794 Long term (current) use of insulin: Secondary | ICD-10-CM | POA: Diagnosis not present

## 2017-12-12 DIAGNOSIS — Z7982 Long term (current) use of aspirin: Secondary | ICD-10-CM | POA: Diagnosis not present

## 2017-12-12 DIAGNOSIS — Z79899 Other long term (current) drug therapy: Secondary | ICD-10-CM | POA: Diagnosis not present

## 2017-12-12 DIAGNOSIS — I208 Other forms of angina pectoris: Secondary | ICD-10-CM

## 2017-12-14 ENCOUNTER — Encounter (HOSPITAL_COMMUNITY): Payer: Medicare Other

## 2017-12-14 ENCOUNTER — Encounter (HOSPITAL_COMMUNITY)
Admission: RE | Admit: 2017-12-14 | Discharge: 2017-12-14 | Disposition: A | Discharge status: Home / Self Care | Payer: Medicare Other | Source: Ambulatory Visit | Attending: Cardiology | Admitting: Cardiology

## 2017-12-14 DIAGNOSIS — I208 Other forms of angina pectoris: Secondary | ICD-10-CM

## 2017-12-14 DIAGNOSIS — Z7982 Long term (current) use of aspirin: Secondary | ICD-10-CM | POA: Diagnosis not present

## 2017-12-14 DIAGNOSIS — Z79899 Other long term (current) drug therapy: Secondary | ICD-10-CM | POA: Diagnosis not present

## 2017-12-14 DIAGNOSIS — E119 Type 2 diabetes mellitus without complications: Secondary | ICD-10-CM | POA: Diagnosis not present

## 2017-12-14 DIAGNOSIS — I110 Hypertensive heart disease with heart failure: Secondary | ICD-10-CM | POA: Diagnosis not present

## 2017-12-14 DIAGNOSIS — Z794 Long term (current) use of insulin: Secondary | ICD-10-CM | POA: Diagnosis not present

## 2017-12-15 ENCOUNTER — Ambulatory Visit: Payer: Medicare Other

## 2017-12-15 NOTE — Progress Notes (Deleted)
12/15/2017 [name] [birth year] [id number]   HPI:  [name] is a 67 y.o. female patient of Dr Percival Spanish, who presents today for a lipid clinic evaluation.  Current Medications:  Ezetimibe 10 mg qd  Risk Factors:  Cholesterol Goals:   Intolerant/previously tried:  Atorvastatin - myalgias (tried 40 and 80 mg doses)  Simvastatin -                (tried 20 mg)  Rosuvastatin -             (tried 20 mg) Family history:   Diet:   Exercise:    Labs:   11/2017:  TC 185, TG 78, HDL 57, 112  11/2016:  TC 239, TG 90, HDL 60, LDL 161 Current Outpatient Medications  Medication Sig Dispense Refill  . ACCU-CHEK FASTCLIX LANCETS MISC Check blood sugar 3 times a day 102 each 5  . amLODipine (NORVASC) 10 MG tablet TAKE ONE (1) TABLET BY MOUTH EVERY DAY 90 tablet 3  . aspirin EC 81 MG tablet Take 81 mg by mouth daily.    . cetirizine (ZYRTEC) 10 MG tablet Take 10 mg as needed by mouth for allergies.    . Chromium 1000 MCG TABS Take 1,000 mcg by mouth daily.    . Coenzyme Q10 (CO Q 10 PO) Take 50 mg by mouth daily.     Marland Kitchen ezetimibe (ZETIA) 10 MG tablet TAKE ONE (1) TABLET BY MOUTH EVERY DAY PLEASE CALL TO SCHEDULE AN APPOINTMENT 30 tablet 11  . glucose blood (ACCU-CHEK SMARTVIEW) test strip Check blood sugar 3 times a day 100 each 5  . isosorbide mononitrate (IMDUR) 120 MG 24 hr tablet Take 2 tablets (240 mg total) by mouth every morning. 30 tablet 11  . loratadine (CLARITIN) 10 MG tablet TAKE ONE TABLET BY MOUTH ONCE DAILY FOR ALLERGIES 90 tablet 3  . losartan (COZAAR) 50 MG tablet TAKE ONE (1) TABLET BY MOUTH EVERY DAY 90 tablet 3  . Lutein 6 MG TABS Take 6 mg by mouth daily.    . metFORMIN (GLUCOPHAGE) 1000 MG tablet TAKE ONE (1) TABLET BY MOUTH TWO (2) TIMES DAILY WITH A MEAL 180 tablet 3  . metoprolol succinate (TOPROL-XL) 100 MG 24 hr tablet TAKE ONE (1) TABLET BY MOUTH EVERY DAY (Patient taking differently: TAKE 100 gm TABLET BY MOUTH EVERY DAY) 90 tablet 3  . nitroGLYCERIN  (NITRODUR - DOSED IN MG/24 HR) 0.4 mg/hr patch APPLY ONE PATCH ON SKIN EVERY DAY. LEAVE ON FOR 12 HOURS AND OFF FOR 12 HOURS. 100 patch 3  . nitroGLYCERIN (NITROSTAT) 0.4 MG SL tablet PLACE 1 TABLET UNDER THE TONGUE EVERY 5 MINUTES AS NEEDED FOR CHEST PAIN 25 tablet 7  . Olopatadine HCl (PATADAY) 0.2 % SOLN Place 1 drop into both eyes daily as needed. For allergies    . OVER THE COUNTER MEDICATION Take 1 tablet by mouth daily. SUPER VITAMIN B-COMPLEX (Vitamin B6 and B12)    . repaglinide (PRANDIN) 2 MG tablet Take 1 tablet (2 mg total) by mouth daily with breakfast AND 2 tablets (4 mg total) daily with lunch AND 2 tablets (4 mg total) daily with supper. 270 tablet 3  . spironolactone (ALDACTONE) 25 MG tablet Take 1 tablet (25 mg total) daily by mouth. 30 tablet 6  . TOUJEO SOLOSTAR 300 UNIT/ML SOPN INJECT 35 UNITS INTO THE SKIN DAILY 12 mL 3  . Turmeric Curcumin 500 MG CAPS Take 500 mg by mouth daily.  No current facility-administered medications for this visit.     Allergies  Allergen Reactions  . Lisinopril Other (See Comments)    Chronic cough secondary to ACE  . Oxycodone-Acetaminophen Hives and Itching    flushing  . Victoza [Liraglutide]     Made her feel "out of it"  . Atorvastatin Other (See Comments)    myalgias    Past Medical History:  Diagnosis Date  . Arthritis    "knees, lower back" (08/18/2017)  . 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 S/p lumpectomy and radiation.  Marland Kitchen CAD (coronary artery disease) 11/07   cath 11/11 :  3V CADsee report   . Cataract    small both eyes  . Chronic lower back pain   . Chronic pain    MR 08/07/10 :  Spondylosis L4-5 with B foraminal narrowing and L4 nerve root enchroachment B.  . Chronic venous insufficiency 2012   Has had full W/U incl ECHO, LFT's, Cr, Umicro, TSH, BNP. Symptomatic treatment.  . Colonic polyp 1995   Colonoscopies 1994, 2000, and 02/02/2011. Last had 9 polyps -  Tubular adenoma and tubulovillous was the pathology results without high grade dysplasia  . Diabetes mellitus type II    Insulin dependent. Has worked with [name] R previously.  . Diastolic CHF, chronic ([place])    NL EF by echo 01/2012, Gr I dd  . Essential hypertension    Requires 5 drug therapy and still difficult to control.  Marland Kitchen GERD (gastroesophageal reflux disease)   . Heart murmur   . Hyperlipidemia    On daily statin  . Iron deficiency anemia    Ferritin was 12 in 2012. on FeSo4. Has had colon and EGD 02/02/11 that showed mild gastritis and colon polyps.  . Obesity    Morbid. HAs worked with [name] T previously.  . OSA (obstructive sleep apnea) 02/16/2007   02/2007 : Moderate AHI 35.6, O2 sat decreased to 65%, CPAP titration to 16 with AHI of 3.6. 02/2014 : Rare respiratory events with sleep disturbance, within normal limits. AHI 0.4 per hour      . OSA on CPAP   . Personal history of radiation therapy    "to my left breast"  . Refusal of blood transfusions as patient is Jehovah's Witness   . Seasonal allergies    "grass pollen"    Last menstrual period 07/12/1984.   No problem-specific Assessment & Plan notes found for this encounter.   Tommy Medal PharmD CPP [facility]

## 2017-12-16 ENCOUNTER — Encounter (HOSPITAL_COMMUNITY): Payer: Medicare Other

## 2017-12-16 ENCOUNTER — Encounter (HOSPITAL_COMMUNITY)
Admission: RE | Admit: 2017-12-16 | Discharge: 2017-12-16 | Disposition: A | Discharge status: Home / Self Care | Payer: Medicare Other | Source: Ambulatory Visit | Attending: Cardiology | Admitting: Cardiology

## 2017-12-16 DIAGNOSIS — I110 Hypertensive heart disease with heart failure: Secondary | ICD-10-CM | POA: Diagnosis not present

## 2017-12-16 DIAGNOSIS — Z79899 Other long term (current) drug therapy: Secondary | ICD-10-CM | POA: Diagnosis not present

## 2017-12-16 DIAGNOSIS — Z794 Long term (current) use of insulin: Secondary | ICD-10-CM | POA: Diagnosis not present

## 2017-12-16 DIAGNOSIS — I208 Other forms of angina pectoris: Secondary | ICD-10-CM

## 2017-12-16 DIAGNOSIS — Z7982 Long term (current) use of aspirin: Secondary | ICD-10-CM | POA: Diagnosis not present

## 2017-12-16 DIAGNOSIS — E119 Type 2 diabetes mellitus without complications: Secondary | ICD-10-CM | POA: Diagnosis not present

## 2017-12-19 ENCOUNTER — Encounter (HOSPITAL_COMMUNITY)
Admission: RE | Admit: 2017-12-19 | Discharge: 2017-12-19 | Disposition: A | Discharge status: Home / Self Care | Payer: Medicare Other | Source: Ambulatory Visit | Attending: Cardiology | Admitting: Cardiology

## 2017-12-19 DIAGNOSIS — I208 Other forms of angina pectoris: Secondary | ICD-10-CM | POA: Diagnosis not present

## 2017-12-19 DIAGNOSIS — I110 Hypertensive heart disease with heart failure: Secondary | ICD-10-CM | POA: Diagnosis not present

## 2017-12-19 DIAGNOSIS — E119 Type 2 diabetes mellitus without complications: Secondary | ICD-10-CM | POA: Diagnosis not present

## 2017-12-19 DIAGNOSIS — Z79899 Other long term (current) drug therapy: Secondary | ICD-10-CM | POA: Diagnosis not present

## 2017-12-19 DIAGNOSIS — Z7982 Long term (current) use of aspirin: Secondary | ICD-10-CM | POA: Diagnosis not present

## 2017-12-19 DIAGNOSIS — Z794 Long term (current) use of insulin: Secondary | ICD-10-CM | POA: Diagnosis not present

## 2017-12-20 ENCOUNTER — Other Ambulatory Visit: Payer: Self-pay | Admitting: Internal Medicine

## 2017-12-21 ENCOUNTER — Other Ambulatory Visit: Payer: Self-pay

## 2017-12-21 ENCOUNTER — Encounter (HOSPITAL_COMMUNITY): Payer: Medicare Other

## 2017-12-21 NOTE — Telephone Encounter (Signed)
Pt would like to know if she can get Meloxicam, states she is hurting all over. Please call pt back.

## 2017-12-21 NOTE — Telephone Encounter (Signed)
Cannot find where pt has been prescribed this in past, called pt, lm for rtc for appt

## 2017-12-21 NOTE — Telephone Encounter (Signed)
Relayed message to pt, scheduled ACC 3/21 at 1015

## 2017-12-21 NOTE — Telephone Encounter (Signed)
I would rather she not take a NSAID due to age, use of ASA, and no GI prophylaxis. If hurting is new, might need appt. Otherwise, tylenol, light exercise, heat / ice.

## 2017-12-22 ENCOUNTER — Other Ambulatory Visit: Payer: Self-pay

## 2017-12-22 ENCOUNTER — Encounter (HOSPITAL_COMMUNITY): Payer: Self-pay

## 2017-12-22 ENCOUNTER — Ambulatory Visit (INDEPENDENT_AMBULATORY_CARE_PROVIDER_SITE_OTHER): Payer: Medicare Other | Admitting: Internal Medicine

## 2017-12-22 ENCOUNTER — Encounter: Payer: Self-pay | Admitting: Internal Medicine

## 2017-12-22 ENCOUNTER — Ambulatory Visit: Payer: Medicare Other | Admitting: Dietician

## 2017-12-22 VITALS — BP 133/63 | HR 77 | Temp 98.1°F | Ht 61.0 in | Wt 221.0 lb

## 2017-12-22 DIAGNOSIS — M479 Spondylosis, unspecified: Secondary | ICD-10-CM | POA: Diagnosis not present

## 2017-12-22 DIAGNOSIS — I2510 Atherosclerotic heart disease of native coronary artery without angina pectoris: Secondary | ICD-10-CM

## 2017-12-22 DIAGNOSIS — M791 Myalgia, unspecified site: Secondary | ICD-10-CM | POA: Diagnosis not present

## 2017-12-22 DIAGNOSIS — M170 Bilateral primary osteoarthritis of knee: Secondary | ICD-10-CM

## 2017-12-22 DIAGNOSIS — I25118 Atherosclerotic heart disease of native coronary artery with other forms of angina pectoris: Secondary | ICD-10-CM

## 2017-12-22 DIAGNOSIS — I10 Essential (primary) hypertension: Secondary | ICD-10-CM | POA: Diagnosis not present

## 2017-12-22 DIAGNOSIS — Z7982 Long term (current) use of aspirin: Secondary | ICD-10-CM | POA: Diagnosis not present

## 2017-12-22 DIAGNOSIS — Z79899 Other long term (current) drug therapy: Secondary | ICD-10-CM

## 2017-12-22 MED ORDER — MELOXICAM 7.5 MG PO TABS: 7.5000 mg | ORAL_TABLET | Freq: Every day | ORAL | 2 refills | Status: DC

## 2017-12-22 NOTE — Patient Instructions (Signed)
Thank you for visiting clinic today. I am sorry that you are hurting that much, we will check some lab work and will call you with any abnormal results. I gave you a prescription for Mobic 7.5 mg daily, take it every day for 7 days and see if it helps. You can take Tylenol with it if needed. Please follow-up in clinic if your symptoms get worse or fail to resolve within next few days.

## 2017-12-22 NOTE — Progress Notes (Signed)
Internal Medicine Clinic Attending  I saw and evaluated the patient.  I personally confirmed the key portions of the history and exam documented by Dr. Amin and I reviewed pertinent patient test results.  The assessment, diagnosis, and plan were formulated together and I agree with the documentation in the resident's note. 

## 2017-12-22 NOTE — Progress Notes (Signed)
CC: Generalized myalgia since Monday.  HPI:  [name] is a 67 y.o. with past medical history as listed below came to the clinic with complaint of aches and pains involving her arms, elbow and wrist, lower back and knees started on Monday. Patient has an history of arthritis and thinks that she is getting flareup of her arthritis. Participate with cardiac rehab 3 times a week and unable to complete her exercises on Monday, over the weekend she was doing exercises at her apartment complex gym.  On Monday morning she developed soreness in her muscles mostly involving upper arm and thighs. She denies any upper respiratory symptoms, cough, fever or sick contacts. She denies any worsening shortness of breath, orthopnea or PND. She denies any nausea, vomiting, constipation or diarrhea. She tried Aleve, trumeric tea, hot and cold patches with minimum relief.  Please see assessment and plan for her chronic condition.  Past Medical History:  Diagnosis Date  . Arthritis    "knees, lower back" (08/18/2017)  . 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 S/p lumpectomy and radiation.  Marland Kitchen CAD (coronary artery disease) 11/07   cath 11/11 :  3V CADsee report   . Cataract    small both eyes  . Chronic lower back pain   . Chronic pain    MR 08/07/10 :  Spondylosis L4-5 with B foraminal narrowing and L4 nerve root enchroachment B.  . Chronic venous insufficiency 2012   Has had full W/U incl ECHO, LFT's, Cr, Umicro, TSH, BNP. Symptomatic treatment.  . Colonic polyp 1995   Colonoscopies 1994, 2000, and 02/02/2011. Last had 9 polyps - Tubular adenoma and tubulovillous was the pathology results without high grade dysplasia  . Diabetes mellitus type II    Insulin dependent. Has worked with [name] R previously.  . Diastolic CHF, chronic ([place])    NL EF by echo 01/2012, Gr I dd  . Essential hypertension    Requires 5 drug therapy and still difficult to  control.  Marland Kitchen GERD (gastroesophageal reflux disease)   . Heart murmur   . Hyperlipidemia    On daily statin  . Iron deficiency anemia    Ferritin was 12 in 2012. on FeSo4. Has had colon and EGD 02/02/11 that showed mild gastritis and colon polyps.  . Obesity    Morbid. HAs worked with [name] T previously.  . OSA (obstructive sleep apnea) 02/16/2007   02/2007 : Moderate AHI 35.6, O2 sat decreased to 65%, CPAP titration to 16 with AHI of 3.6. 02/2014 : Rare respiratory events with sleep disturbance, within normal limits. AHI 0.4 per hour      . OSA on CPAP   . Personal history of radiation therapy    "to my left breast"  . Refusal of blood transfusions as patient is Jehovah's Witness   . Seasonal allergies    "grass pollen"   Review of Systems: Negative except mentioned in HPI.  Physical Exam:  Vitals:   12/22/17 1005  BP: 133/63  Pulse: 77  Temp: 98.1 F (36.7 C)  TempSrc: Oral  SpO2: 100%  Weight: 221 lb (100.2 kg)  Height: 5\' 1"  (1.549 m)    General: Vital signs reviewed.  Patient is well-developed and well-nourished, in no acute distress and cooperative with exam.  Head: Normocephalic and atraumatic. Eyes: EOMI, conjunctivae normal, no scleral icterus.  Cardiovascular: RRR, S1 normal, S2 normal, no murmurs, gallops, or rubs. Pulmonary/Chest: Clear to auscultation  bilaterally, no wheezes, rales, or rhonchi. Abdominal: Soft, non-tender, non-distended, BS +, no masses, organomegaly, or guarding present.  Musculoskeletal: Tenderness along upper arm and thigh musculature ,no joint deformities, erythema, or stiffness, ROM full with some subjective complaint of pain while moving elbows and knees. Extremities: 1+ lower extremity edema bilaterally,  pulses symmetric and intact bilaterally.   Skin: Warm, dry and intact.  Psychiatric: Normal mood and affect. speech and behavior is normal. Cognition and memory are normal.  Assessment & Plan:   See Encounters Tab for problem based  charting.  Patient seen with Dr. Evette Doffing.

## 2017-12-22 NOTE — Progress Notes (Signed)
Cardiac Individual Treatment Plan  Patient Details  Name: [name] MRN: [id number] Date of Birth: 08/02/1951 Referring Provider:   Flowsheet Row CARDIAC REHAB PHASE II ORIENTATION from 10/25/2017 in [place]  Referring Provider  Minus Breeding, MD.      Initial Encounter Date:  Flowsheet Row CARDIAC REHAB PHASE II ORIENTATION from 10/25/2017 in [place]  Date  10/25/17  Referring Provider  Minus Breeding, MD.      Visit Diagnosis: Stable angina [facility])  Patient's Home Medications on Admission:  Current Outpatient Medications:  .  ACCU-CHEK FASTCLIX LANCETS MISC, Check blood sugar 3 times a day, Disp: 102 each, Rfl: 5 .  amLODipine (NORVASC) 10 MG tablet, TAKE ONE (1) TABLET BY MOUTH EVERY DAY, Disp: 90 tablet, Rfl: 3 .  aspirin EC 81 MG tablet, Take 81 mg by mouth daily., Disp: , Rfl:  .  cetirizine (ZYRTEC) 10 MG tablet, Take 10 mg as needed by mouth for allergies., Disp: , Rfl:  .  Chromium 1000 MCG TABS, Take 1,000 mcg by mouth daily., Disp: , Rfl:  .  Coenzyme Q10 (CO Q 10 PO), Take 50 mg by mouth daily. , Disp: , Rfl:  .  ezetimibe (ZETIA) 10 MG tablet, TAKE ONE (1) TABLET BY MOUTH EVERY DAY PLEASE CALL TO SCHEDULE AN APPOINTMENT, Disp: 30 tablet, Rfl: 11 .  glucose blood (ACCU-CHEK SMARTVIEW) test strip, Check blood sugar 3 times a day, Disp: 100 each, Rfl: 5 .  Insulin Pen Needle (UNIFINE PENTIPS) 31G X 5 MM MISC, USE FOR INJECTIONS TWICE DAILY. Diagnosis code E11.40, Z79.4, Disp: 100 each, Rfl: 11 .  isosorbide mononitrate (IMDUR) 120 MG 24 hr tablet, Take 2 tablets (240 mg total) by mouth every morning., Disp: 30 tablet, Rfl: 11 .  loratadine (CLARITIN) 10 MG tablet, TAKE ONE TABLET BY MOUTH ONCE DAILY FOR ALLERGIES, Disp: 90 tablet, Rfl: 3 .  losartan (COZAAR) 50 MG tablet, TAKE ONE (1) TABLET BY MOUTH EVERY DAY, Disp: 90 tablet, Rfl: 3 .  Lutein 6 MG TABS, Take 6 mg by mouth daily., Disp: , Rfl:  .   meloxicam (MOBIC) 7.5 MG tablet, Take 1 tablet (7.5 mg total) by mouth daily., Disp: 30 tablet, Rfl: 2 .  metFORMIN (GLUCOPHAGE) 1000 MG tablet, TAKE ONE (1) TABLET BY MOUTH TWO (2) TIMES DAILY WITH A MEAL, Disp: 180 tablet, Rfl: 3 .  metoprolol succinate (TOPROL-XL) 100 MG 24 hr tablet, TAKE ONE (1) TABLET BY MOUTH EVERY DAY (Patient taking differently: TAKE 100 gm TABLET BY MOUTH EVERY DAY), Disp: 90 tablet, Rfl: 3 .  nitroGLYCERIN (NITRODUR - DOSED IN MG/24 HR) 0.4 mg/hr patch, APPLY ONE PATCH ON SKIN EVERY DAY. LEAVE ON FOR 12 HOURS AND OFF FOR 12 HOURS., Disp: 100 patch, Rfl: 3 .  nitroGLYCERIN (NITROSTAT) 0.4 MG SL tablet, PLACE 1 TABLET UNDER THE TONGUE EVERY 5 MINUTES AS NEEDED FOR CHEST PAIN, Disp: 25 tablet, Rfl: 7 .  Olopatadine HCl (PATADAY) 0.2 % SOLN, Place 1 drop into both eyes daily as needed. For allergies, Disp: , Rfl:  .  OVER THE COUNTER MEDICATION, Take 1 tablet by mouth daily. SUPER VITAMIN B-COMPLEX (Vitamin B6 and B12), Disp: , Rfl:  .  repaglinide (PRANDIN) 2 MG tablet, Take 1 tablet (2 mg total) by mouth daily with breakfast AND 2 tablets (4 mg total) daily with lunch AND 2 tablets (4 mg total) daily with supper., Disp: 270 tablet, Rfl: 3 .  spironolactone (ALDACTONE) 25  MG tablet, Take 1 tablet (25 mg total) daily by mouth., Disp: 30 tablet, Rfl: 6 .  TOUJEO SOLOSTAR 300 UNIT/ML SOPN, INJECT 35 UNITS INTO THE SKIN DAILY, Disp: 12 mL, Rfl: 3 .  Turmeric Curcumin 500 MG CAPS, Take 500 mg by mouth daily., Disp: , Rfl:   Past Medical History: Past Medical History:  Diagnosis Date  . Arthritis    "knees, lower back" (08/18/2017)  . 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 S/p lumpectomy and radiation.  Marland Kitchen CAD (coronary artery disease) 11/07   cath 11/11 :  3V CADsee report   . Cataract    small both eyes  . Chronic lower back pain   . Chronic pain    MR 08/07/10 :  Spondylosis L4-5 with B foraminal narrowing and L4 nerve  root enchroachment B.  . Chronic venous insufficiency 2012   Has had full W/U incl ECHO, LFT's, Cr, Umicro, TSH, BNP. Symptomatic treatment.  . Colonic polyp 1995   Colonoscopies 1994, 2000, and 02/02/2011. Last had 9 polyps - Tubular adenoma and tubulovillous was the pathology results without high grade dysplasia  . Diabetes mellitus type II    Insulin dependent. Has worked with [name] R previously.  . Diastolic CHF, chronic ([place])    NL EF by echo 01/2012, Gr I dd  . Essential hypertension    Requires 5 drug therapy and still difficult to control.  Marland Kitchen GERD (gastroesophageal reflux disease)   . Heart murmur   . Hyperlipidemia    On daily statin  . Iron deficiency anemia    Ferritin was 12 in 2012. on FeSo4. Has had colon and EGD 02/02/11 that showed mild gastritis and colon polyps.  . Obesity    Morbid. HAs worked with [name] T previously.  . OSA (obstructive sleep apnea) 02/16/2007   02/2007 : Moderate AHI 35.6, O2 sat decreased to 65%, CPAP titration to 16 with AHI of 3.6. 02/2014 : Rare respiratory events with sleep disturbance, within normal limits. AHI 0.4 per hour      . OSA on CPAP   . Personal history of radiation therapy    "to my left breast"  . Refusal of blood transfusions as patient is Jehovah's Witness   . Seasonal allergies    "grass pollen"    Tobacco Use: Social History   Tobacco Use  Smoking Status Never Smoker  Smokeless Tobacco Never Used    Labs: Recent Review Flowsheet Data    Labs for ITP Cardiac and Pulmonary Rehab Latest Ref Rng & Units 11/04/2016 03/01/2017 06/09/2017 08/19/2017 11/04/2017   Cholestrol 100 - 199 mg/dL 239(H) - - - 185   LDLCALC 0 - 99 mg/dL 161(H) - - - 112(H)   LDLDIRECT mg/dL - - - - -   HDL >39 mg/dL 60 - - - 57   Trlycerides 0 - 149 mg/dL 90 - - - 78   Hemoglobin A1c - 7.3 8.6 9.0 8.8(H) 7.9   TCO2 0 - 100 mmol/L - - - - -      Capillary Blood Glucose: Lab Results  Component Value Date   GLUCAP 173 (H) 12/05/2017   GLUCAP 259 (H)  11/30/2017   GLUCAP 190 (H) 11/28/2017   GLUCAP 105 (H) 11/21/2017   GLUCAP 130 (H) 11/16/2017     Exercise Target Goals:    Exercise Program Goal: Individual exercise prescription set using results from initial 6 min walk test and THRR while considering  patient's activity barriers and safety.   Exercise Prescription Goal: Initial exercise prescription builds to 30-45 minutes a day of aerobic activity, 2-3 days per week.  Home exercise guidelines will be given to patient during program as part of exercise prescription that the participant will acknowledge.  Activity Barriers & Risk Stratification: Activity Barriers & Cardiac Risk Stratification - 10/25/17 0839    Activity Barriers & Cardiac Risk Stratification          Activity Barriers  Arthritis;History of Falls;Assistive Device;Balance Concerns    Cardiac Risk Stratification  High           6 Minute Walk: 6 Minute Walk    6 Minute Walk    Row Name 10/25/17 1037   Phase  Initial   Distance  1067 feet   Walk Time  6 minutes   # of Rest Breaks  0   MPH  2.02   METS  2.08   RPE  8   VO2 Peak  7.27   Symptoms  No   Resting HR  66 bpm   Resting BP  128/70   Resting Oxygen Saturation   97 %   Exercise Oxygen Saturation  during 6 min walk  98 %   Max Ex. HR  101 bpm   Max Ex. BP  150/78   2 Minute Post BP  128/62          Oxygen Initial Assessment:   Oxygen Re-Evaluation:   Oxygen Discharge (Final Oxygen Re-Evaluation):   Initial Exercise Prescription: Initial Exercise Prescription - 10/25/17 1000    Date of Initial Exercise RX and Referring Provider          Date  10/25/17    Referring Provider  Minus Breeding, MD.        Bike          Level  0.6    Minutes  10    METs  2.13        NuStep          Level  3    SPM  85    Minutes  10    METs  2.2        Track          Laps  8    Minutes  10    METs  2.39        Prescription Details          Frequency (times per week)  3     Duration  Progress to 30 minutes of continuous aerobic without signs/symptoms of physical distress        Intensity          THRR 40-80% of Max Heartrate  62-123    Ratings of Perceived Exertion  11-13    Perceived Dyspnea  0-4        Progression          Progression  Continue to progress workloads to maintain intensity without signs/symptoms of physical distress.        Resistance Training          Training Prescription  Yes    Weight  3lbs    Reps  10-15           Perform Capillary Blood Glucose checks as needed.  Exercise Prescription Changes: Exercise Prescription Changes    Response to Exercise    Row Name 11/02/17 1600 11/07/17 1719 11/21/17 1515 12/05/17 1543 12/19/17 1422   Blood Pressure (Admit)  108/62  130/60  130/58  132/70  114/76   Blood Pressure (Exercise)  122/78  118/70  124/70  118/62  124/78   Blood Pressure (Exit)  122/78  130/60  128/60  118/72  122/60   Heart Rate (Admit)  99 bpm  83 bpm  85 bpm  71 bpm  82 bpm   Heart Rate (Exercise)  135 bpm  131 bpm  113 bpm  102 bpm  116 bpm   Heart Rate (Exit)  98 bpm  91 bpm  83 bpm  66 bpm  70 bpm   Symptoms  none  none  none  none  none   Comments  pt oriented to exercise equipment today  pt oriented to exercise equipment today  no documentation  no documentation  no documentation   Duration  Continue with 30 min of aerobic exercise without signs/symptoms of physical distress.  Continue with 30 min of aerobic exercise without signs/symptoms of physical distress.  Continue with 30 min of aerobic exercise without signs/symptoms of physical distress.  Continue with 30 min of aerobic exercise without signs/symptoms of physical distress.  Continue with 30 min of aerobic exercise without signs/symptoms of physical distress.   Intensity  THRR unchanged  THRR unchanged  THRR unchanged  THRR unchanged  THRR unchanged       Progression    Row Name 11/02/17 1600 11/07/17 1719 11/21/17 1515 12/05/17 1543 12/19/17 1422    Progression  Continue to progress workloads to maintain intensity without signs/symptoms of physical distress.  Continue to progress workloads to maintain intensity without signs/symptoms of physical distress.  Continue to progress workloads to maintain intensity without signs/symptoms of physical distress.  Continue to progress workloads to maintain intensity without signs/symptoms of physical distress.  Continue to progress workloads to maintain intensity without signs/symptoms of physical distress.   Average METs  2.4  2.1  2.3  2.1  2.5       Resistance Training    Row Name 11/02/17 1600 11/07/17 1719 11/21/17 1515 12/05/17 1543 12/19/17 1422   Training Prescription  No relaxation day  No  Yes  Yes  Yes   Weight  no documentation  no documentation  5lbs  4lbs  4lbs   Reps  no documentation  no documentation  10-15  10-15  10-15   Time  no documentation  no documentation  10 Minutes  [place] Name 11/02/17 1600 11/07/17 1719 11/21/17 1515 12/05/17 1543 12/19/17 1422   Level  3  no documentation  4  no documentation  no documentation   SPM  75  no documentation  85  no documentation  no documentation   Minutes  15  no documentation  10  no documentation  no documentation   METs  1.9  no documentation  2.4  no documentation  no documentation       Arm Ergometer    Row Name 11/02/17 1600 11/07/17 1719 11/21/17 1515 12/05/17 1543 12/19/17 1422   Level  1  1  no documentation  2  3   Minutes  10  15  no documentation  15  15   METs  2.8  2.3  no documentation  2.3  3       Track    Row Name 11/02/17 1600 11/07/17 1719 11/21/17 1515 12/05/17 1543 12/19/17 1422   Laps  8  8  16   9  8   Minutes  10  15  20  15  15    METs  2.39  2  2.3  2  2        Home Exercise Plan    Row Name 11/02/17 1600 11/07/17 1719 11/21/17 1515 12/05/17 1543 12/19/17 1422   Plans to continue exercise at  no documentation  no documentation  Home (comment) walking  Home (comment)  walking  Home (comment) walking   Frequency  no documentation  no documentation  Add 2 additional days to program exercise sessions.  Add 2 additional days to program exercise sessions.  Add 2 additional days to program exercise sessions.   Initial Home Exercises Provided  no documentation  no documentation  11/16/16  11/16/16  11/16/16          Exercise Comments: Exercise Comments    Row Name 11/02/17 1629 11/18/17 1207 12/21/17 0828   Exercise Comments  Pt responded well to first exercise session at cardiac rehab. Pt denied any symptoms of SOB, CP and dizziness. Pt will continue to be monitor and will progress exercise as tolerated.  Reviewed HEP and activity levels. Pt is progressing well in cardiac rehab; will continue to monitor and progress exercise as tolerated  Reviewed HEP and goals. Pt is progressing well in cardiac rehab; will continue to monitor and progress exercise as tolerated      Exercise Goals and Review: Exercise Goals    Exercise Goals    Row Name 10/25/17 1040   Increase Physical Activity  Yes   Intervention  Provide advice, education, support and counseling about physical activity/exercise needs.;Develop an individualized exercise prescription for aerobic and resistive training based on initial evaluation findings, risk stratification, comorbidities and participant's personal goals.   Expected Outcomes  Short Term: Attend rehab on a regular basis to increase amount of physical activity.;Long Term: Exercising regularly at least 3-5 days a week.;Long Term: Add in home exercise to make exercise part of routine and to increase amount of physical activity.   Increase Strength and Stamina  Yes   Intervention  Provide advice, education, support and counseling about physical activity/exercise needs.;Develop an individualized exercise prescription for aerobic and resistive training based on initial evaluation findings, risk stratification, comorbidities and participant's personal  goals.   Expected Outcomes  Short Term: Increase workloads from initial exercise prescription for resistance, speed, and METs.;Short Term: Perform resistance training exercises routinely during rehab and add in resistance training at home;Long Term: Improve cardiorespiratory fitness, muscular endurance and strength as measured by increased METs and functional capacity (6MWT)   Able to understand and use rate of perceived exertion (RPE) scale  Yes   Intervention  Provide education and explanation on how to use RPE scale   Expected Outcomes  Short Term: Able to use RPE daily in rehab to express subjective intensity level;Long Term:  Able to use RPE to guide intensity level when exercising independently   Able to understand and use Dyspnea scale  Yes   Intervention  Provide education and explanation on how to use Dyspnea scale   Expected Outcomes  Short Term: Able to use Dyspnea scale daily in rehab to express subjective sense of shortness of breath during exertion;Long Term: Able to use Dyspnea scale to guide intensity level when exercising independently   Knowledge and understanding of Target Heart Rate Range (THRR)  Yes   Intervention  Provide education and explanation of THRR including how the numbers were predicted and where they are located for reference  Expected Outcomes  Short Term: Able to state/look up THRR;Long Term: Able to use THRR to govern intensity when exercising independently;Short Term: Able to use daily as guideline for intensity in rehab   Able to check pulse independently  Yes   Intervention  Provide education and demonstration on how to check pulse in carotid and radial arteries.;Review the importance of being able to check your own pulse for safety during independent exercise   Expected Outcomes  Short Term: Able to explain why pulse checking is important during independent exercise;Long Term: Able to check pulse independently and accurately   Understanding of Exercise  Prescription  Yes   Intervention  Provide education, explanation, and written materials on patient's individual exercise prescription   Expected Outcomes  Short Term: Able to explain program exercise prescription;Long Term: Able to explain home exercise prescription to exercise independently          Exercise Goals Re-Evaluation : Exercise Goals Re-Evaluation    Exercise Goal Re-Evaluation    Row Name 11/16/17 1445 11/22/17 1519 12/21/17 0827   Exercise Goals Review  Increase Physical Activity;Able to understand and use rate of perceived exertion (RPE) scale;Knowledge and understanding of Target Heart Rate Range (THRR);Understanding of Exercise Prescription;Increase Strength and Stamina;Able to check pulse independently  Increase Physical Activity;Able to understand and use rate of perceived exertion (RPE) scale;Knowledge and understanding of Target Heart Rate Range (THRR);Understanding of Exercise Prescription;Increase Strength and Stamina;Able to check pulse independently  Increase Physical Activity;Able to understand and use rate of perceived exertion (RPE) scale;Knowledge and understanding of Target Heart Rate Range (THRR);Understanding of Exercise Prescription;Increase Strength and Stamina;Able to check pulse independently   Comments  Reviewed home exercise with pt today.  Pt plans to walk and go to apartment complex fitness center for exercise.  Reviewed THR, pulse, RPE, sign and symptoms, NTG use, and when to call 911 or MD.  Also discussed weather considerations and indoor options.  Pt voiced understanding.  Reviewed home exercise with pt today.  Pt plans to walk and go to apartment complex fitness center for exercise.  Reviewed THR, pulse, RPE, sign and symptoms, NTG use, and when to call 911 or MD.  Also discussed weather considerations and indoor options.  Pt voiced understanding.  Pt is compliant with HEP in which she uses the bike, treadmill and stepper. Pt exercises 3x/week in addition to  coming to cardiac rehab.   Expected Outcomes  Pt will be compliant with HEP and will continue to improve in cardiorespiratory fitness  Pt will be compliant with HEP and will continue to improve in cardiorespiratory fitness  Pt will be compliant with HEP and will continue to improve in cardiorespiratory fitness           Discharge Exercise Prescription (Final Exercise Prescription Changes): Exercise Prescription Changes - 12/19/17 1422    Response to Exercise          Blood Pressure (Admit)  114/76    Blood Pressure (Exercise)  124/78    Blood Pressure (Exit)  122/60    Heart Rate (Admit)  82 bpm    Heart Rate (Exercise)  116 bpm    Heart Rate (Exit)  70 bpm    Symptoms  none    Duration  Continue with 30 min of aerobic exercise without signs/symptoms of physical distress.    Intensity  THRR unchanged        Progression          Progression  Continue to progress workloads to maintain  intensity without signs/symptoms of physical distress.    Average METs  2.5        Resistance Training          Training Prescription  Yes    Weight  4lbs    Reps  10-15    Time  10 Minutes        Arm Ergometer          Level  3    Minutes  15    METs  3        Track          Laps  8    Minutes  15    METs  2        Home Exercise Plan          Plans to continue exercise at  Home (comment) walking    Frequency  Add 2 additional days to program exercise sessions.    Initial Home Exercises Provided  11/16/16           Nutrition:  Target Goals: Understanding of nutrition guidelines, daily intake of sodium 1500mg , cholesterol 200mg , calories 30% from fat and 7% or less from saturated fats, daily to have 5 or more servings of fruits and vegetables.  Biometrics: Pre Biometrics - 10/25/17 0807    Pre Biometrics          Height  5\' 1"  (1.549 m)    Weight  219 lb 2.2 oz (99.4 kg)    Waist Circumference  41.5 inches    Hip Circumference  52.25 inches    Waist to Hip Ratio   0.79 %    BMI (Calculated)  41.43    Triceps Skinfold  53 mm    % Body Fat  52.8 %    Grip Strength  33 kg    Flexibility  0 in    Single Leg Stand  4.11 seconds            Nutrition Therapy Plan and Nutrition Goals: Nutrition Therapy & Goals - 11/14/17 1419    Nutrition Therapy          Diet  Carb Modified, Heart Healthy        Personal Nutrition Goals          Nutrition Goal  Improved glycemic control as evidenced by pt's A1c trending from 8.8 to 7 or less.     Personal Goal #2  Pt to identify food quantities necessary to achieve weight loss of 6-24 lb (2.7-10.9 kg) at graduation from cardiac rehab. Goal wt of 60 lb desired.        Intervention Plan          Intervention  Prescribe, educate and counsel regarding individualized specific dietary modifications aiming towards targeted core components such as weight, hypertension, lipid management, diabetes, heart failure and other comorbidities.    Expected Outcomes  Short Term Goal: Understand basic principles of dietary content, such as calories, fat, sodium, cholesterol and nutrients.;Long Term Goal: Adherence to prescribed nutrition plan.           Nutrition Assessments: Nutrition Assessments - 11/14/17 1419    MEDFICTS Scores          Pre Score  23           Nutrition Goals Re-Evaluation:   Nutrition Goals Re-Evaluation:   Nutrition Goals Discharge (Final Nutrition Goals Re-Evaluation):   Psychosocial: Target Goals: Acknowledge presence or absence of significant depression and/or stress, maximize coping skills, provide  positive support system. Participant is able to verbalize types and ability to use techniques and skills needed for reducing stress and depression.  Initial Review & Psychosocial Screening: Initial Psych Review & Screening - 10/25/17 1125    Initial Review          Current issues with  Current Stress Concerns    Source of Stress Concerns  Chronic Illness        Family Dynamics           Good Support System?  Yes        Barriers          Psychosocial barriers to participate in program  The patient should benefit from training in stress management and relaxation.        Screening Interventions          Interventions  Encouraged to exercise    Expected Outcomes  Short Term goal: Utilizing psychosocial counselor, staff and physician to assist with identification of specific Stressors or current issues interfering with healing process. Setting desired goal for each stressor or current issue identified.;Long Term Goal: Stressors or current issues are controlled or eliminated.           Quality of Life Scores: Quality of Life - 11/21/17 1615    Quality of Life Scores          Health/Function Pre  26.43 %    Socioeconomic Pre  29.58 %    Psych/Spiritual Pre  30 %    Family Pre  27.5 %    GLOBAL Pre  28 % overall scores very good.  pt demonstrates positve outlook with good coping skills. pt has strong jehovahs witness faith base          Scores of 19 and below usually indicate a poorer quality of life in these areas.  A difference of  2-3 points is a clinically meaningful difference.  A difference of 2-3 points in the total score of the Quality of Life Index has been associated with significant improvement in overall quality of life, self-image, physical symptoms, and general health in studies assessing change in quality of life.  PHQ-9: Recent Review Flowsheet Data    Depression screen [facility] 2/9 12/22/2017 11/04/2017 11/02/2017 10/27/2017 09/15/2017   Decreased Interest 0 0 0 0 0   Down, Depressed, Hopeless 0 0 0 0 0   PHQ - 2 Score 0 0 0 0 0     Interpretation of Total Score  Total Score Depression Severity:  1-4 = Minimal depression, 5-9 = Mild depression, 10-14 = Moderate depression, 15-19 = Moderately severe depression, 20-27 = Severe depression   Psychosocial Evaluation and Intervention: Psychosocial Evaluation - 11/02/17 1502    Psychosocial Evaluation &  Interventions          Interventions  Stress management education;Encouraged to exercise with the program and follow exercise prescription;Relaxation education    Comments  pt with transportation and financial barriers.  pt has arranged for Medicaid transport. pt daughter accompanies her to appointments for assistance.  pt has strong Jehovahs Witness faith base.      Expected Outcomes  pt will exhibit positive outlook with good coping skills.     Continue Psychosocial Services   Follow up required by staff           Psychosocial Re-Evaluation: Psychosocial Re-Evaluation    Psychosocial Re-Evaluation    [place] Name 11/16/17 1112 12/13/17 1349   Current issues with  None Identified  None Identified  Comments  pt managing transportation without difficulty. pt dtr accompanies her to rehab.   pt managing transportation without difficulty. pt dtr accompanies her to rehab.    Expected Outcomes  pt will exhibit improved  outlook and coping skills.   pt will exhibit improved  outlook and coping skills.    Interventions  Encouraged to attend Cardiac Rehabilitation for the exercise  Encouraged to attend Cardiac Rehabilitation for the exercise   Continue Psychosocial Services   Follow up required by staff  Follow up required by staff          Psychosocial Discharge (Final Psychosocial Re-Evaluation): Psychosocial Re-Evaluation - 12/13/17 1349    Psychosocial Re-Evaluation          Current issues with  None Identified    Comments  pt managing transportation without difficulty. pt dtr accompanies her to rehab.     Expected Outcomes  pt will exhibit improved  outlook and coping skills.     Interventions  Encouraged to attend Cardiac Rehabilitation for the exercise    Continue Psychosocial Services   Follow up required by staff           Vocational Rehabilitation: Provide vocational rehab assistance to qualifying candidates.   Vocational Rehab Evaluation & Intervention: Vocational Rehab -  11/02/17 1628    Initial Vocational Rehab Evaluation & Intervention          Assessment shows need for Vocational Rehabilitation  No           Education: Education Goals: Education classes will be provided on a weekly basis, covering required topics. Participant will state understanding/return demonstration of topics presented.  Learning Barriers/Preferences: Learning Barriers/Preferences - 10/25/17 1042    Learning Barriers/Preferences          Learning Barriers  Sight    Learning Preferences  Written Material           Education Topics: Count Your Pulse:  -Group instruction provided by verbal instruction, demonstration, patient participation and written materials to support subject.  Instructors address importance of being able to find your pulse and how to count your pulse when at home without a heart monitor.  Patients get hands on experience counting their pulse with staff help and individually.   Heart Attack, Angina, and Risk Factor Modification:  -Group instruction provided by verbal instruction, video, and written materials to support subject.  Instructors address signs and symptoms of angina and heart attacks.    Also discuss risk factors for heart disease and how to make changes to improve heart health risk factors. Flowsheet Row CARDIAC REHAB PHASE II EXERCISE from 11/30/2017 in [place]  Date  11/30/17  Instruction Review Code  2- Demonstrated Understanding      Functional Fitness:  -Group instruction provided by verbal instruction, demonstration, patient participation, and written materials to support subject.  Instructors address safety measures for doing things around the house.  Discuss how to get up and down off the floor, how to pick things up properly, how to safely get out of a chair without assistance, and balance training.   Meditation and Mindfulness:  -Group instruction provided by verbal instruction, patient  participation, and written materials to support subject.  Instructor addresses importance of mindfulness and meditation practice to help reduce stress and improve awareness.  Instructor also leads participants through a meditation exercise.    Stretching for Flexibility and Mobility:  -Group instruction provided by verbal instruction, patient participation, and written materials to support subject.  Instructors lead participants through series of stretches that are designed to increase flexibility thus improving mobility.  These stretches are additional exercise for major muscle groups that are typically performed during regular warm up and cool down.   Hands Only CPR:  -Group verbal, video, and participation provides a basic overview of AHA guidelines for community CPR. Role-play of emergencies allow participants the opportunity to practice calling for help and chest compression technique with discussion of AED use.   Hypertension: -Group verbal and written instruction that provides a basic overview of hypertension including the most recent diagnostic guidelines, risk factor reduction with self-care instructions and medication management.    Nutrition I class: Heart Healthy Eating:  -Group instruction provided by PowerPoint slides, verbal discussion, and written materials to support subject matter. The instructor gives an explanation and review of the Therapeutic Lifestyle Changes diet recommendations, which includes a discussion on lipid goals, dietary fat, sodium, fiber, plant stanol/sterol esters, sugar, and the components of a well-balanced, healthy diet. Flowsheet Row CARDIAC REHAB PHASE II EXERCISE from 11/30/2017 in [place]  Date  11/08/17  Educator  RD  Instruction Review Code  2- Demonstrated Understanding      Nutrition II class: Lifestyle Skills:  -Group instruction provided by PowerPoint slides, verbal discussion, and written materials to  support subject matter. The instructor gives an explanation and review of label reading, grocery shopping for heart health, heart healthy recipe modifications, and ways to make healthier choices when eating out.   Diabetes Question & Answer:  -Group instruction provided by PowerPoint slides, verbal discussion, and written materials to support subject matter. The instructor gives an explanation and review of diabetes co-morbidities, pre- and post-prandial blood glucose goals, pre-exercise blood glucose goals, signs, symptoms, and treatment of hypoglycemia and hyperglycemia, and foot care basics.   Diabetes Blitz:  -Group instruction provided by PowerPoint slides, verbal discussion, and written materials to support subject matter. The instructor gives an explanation and review of the physiology behind type 1 and type 2 diabetes, diabetes medications and rational behind using different medications, pre- and post-prandial blood glucose recommendations and Hemoglobin A1c goals, diabetes diet, and exercise including blood glucose guidelines for exercising safely.  Flowsheet Row CARDIAC REHAB PHASE II EXERCISE from 11/30/2017 in [place]  Date  11/08/17 Einar Gip error,  Heart Healthy class taught not Diabetes Blitz]  Educator  RD  Instruction Review Code  2- Demonstrated Understanding      Portion Distortion:  -Group instruction provided by PowerPoint slides, verbal discussion, written materials, and food models to support subject matter. The instructor gives an explanation of serving size versus portion size, changes in portions sizes over the last 20 years, and what consists of a serving from each food group.   Stress Management:  -Group instruction provided by verbal instruction, video, and written materials to support subject matter.  Instructors review role of stress in heart disease and how to cope with stress positively.     Exercising on Your Own:  -Group  instruction provided by verbal instruction, power point, and written materials to support subject.  Instructors discuss benefits of exercise, components of exercise, frequency and intensity of exercise, and end points for exercise.  Also discuss use of nitroglycerin and activating EMS.  Review options of places to exercise outside of rehab.  Review guidelines for sex with heart disease.   Cardiac Drugs I:  -Group instruction provided by verbal instruction and written materials to support subject.  Instructor reviews cardiac drug classes: antiplatelets, anticoagulants, beta blockers, and statins.  Instructor discusses reasons, side effects, and lifestyle considerations for each drug class.   Cardiac Drugs II:  -Group instruction provided by verbal instruction and written materials to support subject.  Instructor reviews cardiac drug classes: angiotensin converting enzyme inhibitors (ACE-I), angiotensin II receptor blockers (ARBs), nitrates, and calcium channel blockers.  Instructor discusses reasons, side effects, and lifestyle considerations for each drug class.   Anatomy and Physiology of the Circulatory System:  Group verbal and written instruction and models provide basic cardiac anatomy and physiology, with the coronary electrical and arterial systems. Review of: AMI, Angina, Valve disease, Heart Failure, Peripheral Artery Disease, Cardiac Arrhythmia, Pacemakers, and the ICD.   Other Education:  -Group or individual verbal, written, or video instructions that support the educational goals of the cardiac rehab program.   Holiday Eating Survival Tips:  -Group instruction provided by PowerPoint slides, verbal discussion, and written materials to support subject matter. The instructor gives patients tips, tricks, and techniques to help them not only survive but enjoy the holidays despite the onslaught of food that accompanies the holidays.   Knowledge Questionnaire Score: Knowledge  Questionnaire Score - 11/02/17 1458    Knowledge Questionnaire Score          Pre Score  19/24  (Pended)            Core Components/Risk Factors/Patient Goals at Admission: Personal Goals and Risk Factors at Admission - 10/25/17 1035    Core Components/Risk Factors/Patient Goals on Admission           Weight Management  Obesity;Yes    Intervention  Weight Management/Obesity: Establish reasonable short term and long term weight goals.;Obesity: Provide education and appropriate resources to help participant work on and attain dietary goals.    Admit Weight  219 lb 2.2 oz (99.4 kg)    Expected Outcomes  Weight Loss: Understanding of general recommendations for a balanced deficit meal plan, which promotes 1-2 lb weight loss per week and includes a negative energy balance of [phone] kcal/d    Diabetes  Yes    Intervention  Provide education about signs/symptoms and action to take for hypo/hyperglycemia.;Provide education about proper nutrition, including hydration, and aerobic/resistive exercise prescription along with prescribed medications to achieve blood glucose in normal ranges: Fasting glucose 65-99 mg/dL    Expected Outcomes  Short Term: Participant verbalizes understanding of the signs/symptoms and immediate care of hyper/hypoglycemia, proper foot care and importance of medication, aerobic/resistive exercise and nutrition plan for blood glucose control.;Long Term: Attainment of HbA1C < 7%.    Hypertension  Yes    Intervention  Provide education on lifestyle modifcations including regular physical activity/exercise, weight management, moderate sodium restriction and increased consumption of fresh fruit, vegetables, and low fat dairy, alcohol moderation, and smoking cessation.;Monitor prescription use compliance.    Expected Outcomes  Short Term: Continued assessment and intervention until BP is < 140/49mm HG in hypertensive participants. < 130/30mm HG in hypertensive participants with  diabetes, heart failure or chronic kidney disease.;Long Term: Maintenance of blood pressure at goal levels.    Lipids  Yes    Intervention  Provide education and support for participant on nutrition & aerobic/resistive exercise along with prescribed medications to achieve LDL 70mg , HDL >40mg .    Expected Outcomes  Short Term: Participant states understanding of desired cholesterol values and is compliant with medications prescribed. Participant is following exercise prescription and nutrition guidelines.;Long Term: Cholesterol controlled with medications as prescribed, with individualized  exercise RX and with personalized nutrition plan. Value goals: LDL < 70mg , HDL > 40 mg.           Core Components/Risk Factors/Patient Goals Review:  Goals and Risk Factor Review    Core Components/Risk Factors/Patient Goals Review    Row Name 11/02/17 1500 11/16/17 1109 11/18/17 1208 12/13/17 1348   Personal Goals Review  Diabetes;Weight Management/Obesity;Hypertension;Lipids  Diabetes;Weight Management/Obesity;Hypertension;Lipids  no documentation  Diabetes;Weight Management/Obesity;Hypertension;Lipids   Review  pt with multiple CAD RF demonstrates willingness to participate in CR program.   pt with multiple CAD RF demonstrates willingness to participate in CR program. pt demonstrates good blood pressure and blood sugar control at cardiac rehab.  no documentation  pt with multiple CAD RF demonstrates willingness to participate in CR program. pt demonstrates good blood pressure and blood sugar control at cardiac rehab. pt verbalizes enjoyment in lifestyle modification education classes.    Expected Outcomes  pt will participate in CR exercise, nutrition and lifestyle modification education to decrease overall RF.    pt will participate in CR exercise, nutrition and lifestyle modification education to decrease overall RF.    no documentation  pt will participate in CR exercise, nutrition and lifestyle modification  education to decrease overall RF.            Core Components/Risk Factors/Patient Goals at Discharge (Final Review):  Goals and Risk Factor Review - 12/13/17 1348    Core Components/Risk Factors/Patient Goals Review          Personal Goals Review  Diabetes;Weight Management/Obesity;Hypertension;Lipids    Review  pt with multiple CAD RF demonstrates willingness to participate in CR program. pt demonstrates good blood pressure and blood sugar control at cardiac rehab. pt verbalizes enjoyment in lifestyle modification education classes.     Expected Outcomes  pt will participate in CR exercise, nutrition and lifestyle modification education to decrease overall RF.             ITP Comments: ITP Comments    Row Name 10/25/17 1035 11/02/17 1458 11/16/17 1107 11/18/17 1208 12/13/17 1348   ITP Comments  Medical Director- Dr. Fransico Him, MD.  pt started group exercise sessions today. pt tolerated light activity without difficulty.   30 day ITP review. pt demonstrates eagerness to participate in CR opportunties.   30 day ITP review. pt demonstrates eagerness to participate in CR opportunties.   30 day ITP review. pt demonstrates eagerness to participate in CR opportunties.       Comments:

## 2017-12-22 NOTE — Assessment & Plan Note (Addendum)
Symptoms are more consistent with myalgia mostly involving her upper arm and thighs.  She was having benign exam for her joints, some tenderness along upper arm and thigh muscles.  She might did some over workup causing these aches and pains as no other sign of any viral illness, PMR or myositis can be a possibility, as she is having more difficulty  rising from chair and pain when she moved her both arms above her head.  We will check CBC, CMP, CK, ESR and CRP. She was given a prescription of Mobic 7.5 mg daily to take it every day for 7 days then she can take it as needed she can take Tylenol in between to help with her symptoms.  Addendum.  She is having mildly elevated ESR at 49 and CRP at 5.4.  Attending was 1.01 and CBC with no leukocytosis but elevated lymphocyte.  CK is normal. She might be having some viral prodrome.  PMR is another possibility. Called the patient and she is still experiencing a lot of body aches with morning stiffness of her upper arms and thighs.  I advised her to continue taking Mobic over the weekend and if her symptoms fail to improve she can come back on Monday.

## 2017-12-22 NOTE — Assessment & Plan Note (Signed)
BP Readings from Last 3 Encounters:  12/22/17 133/63  11/04/17 117/67  11/03/17 (!) 146/72   Her blood pressure was within goal today.  Continue current management.

## 2017-12-22 NOTE — Assessment & Plan Note (Signed)
She denies any chest pain. Denies any worsening exertional dyspnea, orthopnea or PND. She was able to participate with cardiac rehab very well except since Monday when she started getting these aches and pains.  Continue cardiac rehab and follow-up with cardiology.

## 2017-12-23 ENCOUNTER — Encounter (HOSPITAL_COMMUNITY): Payer: Medicare Other

## 2017-12-23 LAB — CMP14 + ANION GAP
A/G RATIO: 1.4 (ref 1.2–2.2)
ALK PHOS: 69 IU/L (ref 39–117)
ALT: 16 IU/L (ref 0–32)
ANION GAP: 18 mmol/L (ref 10.0–18.0)
AST: 18 IU/L (ref 0–40)
Albumin: 4.4 g/dL (ref 3.6–4.8)
BUN/Creatinine Ratio: 18 (ref 12–28)
BUN: 18 mg/dL (ref 8–27)
Bilirubin Total: <0.2 mg/dL (ref 0.0–1.2)
CO2: 21 mmol/L (ref 20–29)
Calcium: 10.1 mg/dL (ref 8.7–10.3)
Chloride: 104 mmol/L (ref 96–106)
Creatinine, Ser: 1.01 mg/dL — ABNORMAL HIGH (ref 0.57–1.00)
GFR calc Af Amer: 67 mL/min/{1.73_m2} (ref >59)
GFR calc non Af Amer: 58 mL/min/{1.73_m2} — ABNORMAL LOW (ref >59)
GLOBULIN, TOTAL: 3.2 g/dL (ref 1.5–4.5)
GLUCOSE: 127 mg/dL — AB (ref 65–99)
POTASSIUM: 4.5 mmol/L (ref 3.5–5.2)
SODIUM: 143 mmol/L (ref 134–144)
Total Protein: 7.6 g/dL (ref 6.0–8.5)

## 2017-12-23 LAB — SEDIMENTATION RATE: SED RATE: 49 mm/h — AB (ref 0–40)

## 2017-12-23 LAB — CBC WITH DIFFERENTIAL/PLATELET
BASOS ABS: 0 10*3/uL (ref 0.0–0.2)
Basos: 0 %
EOS (ABSOLUTE): 0.2 10*3/uL (ref 0.0–0.4)
Eos: 3 %
Hematocrit: 36.4 % (ref 34.0–46.6)
Hemoglobin: 12.1 g/dL (ref 11.1–15.9)
IMMATURE GRANULOCYTES: 0 %
Immature Grans (Abs): 0 10*3/uL (ref 0.0–0.1)
Lymphocytes Absolute: 3.4 10*3/uL — ABNORMAL HIGH (ref 0.7–3.1)
Lymphs: 47 %
MCH: 29.3 pg (ref 26.6–33.0)
MCHC: 33.2 g/dL (ref 31.5–35.7)
MCV: 88 fL (ref 79–97)
MONOS ABS: 0.4 10*3/uL (ref 0.1–0.9)
Monocytes: 6 %
NEUTROS PCT: 44 %
Neutrophils Absolute: 3.1 10*3/uL (ref 1.4–7.0)
Platelets: 259 10*3/uL (ref 150–379)
RBC: 4.13 x10E6/uL (ref 3.77–5.28)
RDW: 15.9 % — AB (ref 12.3–15.4)
WBC: 7.1 10*3/uL (ref 3.4–10.8)

## 2017-12-23 LAB — C-REACTIVE PROTEIN: CRP: 5.4 mg/L — AB (ref 0.0–4.9)

## 2017-12-23 LAB — CK: CK TOTAL: 161 U/L (ref 24–173)

## 2017-12-26 ENCOUNTER — Encounter (HOSPITAL_COMMUNITY)
Admission: RE | Admit: 2017-12-26 | Discharge: 2017-12-26 | Disposition: A | Discharge status: Home / Self Care | Payer: Medicare Other | Source: Ambulatory Visit | Attending: Cardiology | Admitting: Cardiology

## 2017-12-26 DIAGNOSIS — I208 Other forms of angina pectoris: Secondary | ICD-10-CM | POA: Diagnosis not present

## 2017-12-26 DIAGNOSIS — Z79899 Other long term (current) drug therapy: Secondary | ICD-10-CM | POA: Diagnosis not present

## 2017-12-26 DIAGNOSIS — Z7982 Long term (current) use of aspirin: Secondary | ICD-10-CM | POA: Diagnosis not present

## 2017-12-26 DIAGNOSIS — Z794 Long term (current) use of insulin: Secondary | ICD-10-CM | POA: Diagnosis not present

## 2017-12-26 DIAGNOSIS — E119 Type 2 diabetes mellitus without complications: Secondary | ICD-10-CM | POA: Diagnosis not present

## 2017-12-26 DIAGNOSIS — I110 Hypertensive heart disease with heart failure: Secondary | ICD-10-CM | POA: Diagnosis not present

## 2017-12-28 ENCOUNTER — Encounter (HOSPITAL_COMMUNITY)
Admission: RE | Admit: 2017-12-28 | Discharge: 2017-12-28 | Disposition: A | Discharge status: Home / Self Care | Payer: Medicare Other | Source: Ambulatory Visit | Attending: Cardiology | Admitting: Cardiology

## 2017-12-28 ENCOUNTER — Encounter (HOSPITAL_COMMUNITY): Payer: Medicare Other

## 2017-12-28 DIAGNOSIS — E119 Type 2 diabetes mellitus without complications: Secondary | ICD-10-CM | POA: Diagnosis not present

## 2017-12-28 DIAGNOSIS — I110 Hypertensive heart disease with heart failure: Secondary | ICD-10-CM | POA: Diagnosis not present

## 2017-12-28 DIAGNOSIS — Z7982 Long term (current) use of aspirin: Secondary | ICD-10-CM | POA: Diagnosis not present

## 2017-12-28 DIAGNOSIS — I208 Other forms of angina pectoris: Secondary | ICD-10-CM

## 2017-12-28 DIAGNOSIS — Z79899 Other long term (current) drug therapy: Secondary | ICD-10-CM | POA: Diagnosis not present

## 2017-12-28 DIAGNOSIS — Z794 Long term (current) use of insulin: Secondary | ICD-10-CM | POA: Diagnosis not present

## 2017-12-28 LAB — GLUCOSE, CAPILLARY: GLUCOSE-CAPILLARY: 192 mg/dL — AB (ref 65–99)

## 2017-12-29 DIAGNOSIS — H2513 Age-related nuclear cataract, bilateral: Secondary | ICD-10-CM | POA: Diagnosis not present

## 2017-12-29 DIAGNOSIS — H18413 Arcus senilis, bilateral: Secondary | ICD-10-CM | POA: Diagnosis not present

## 2017-12-29 DIAGNOSIS — H353131 Nonexudative age-related macular degeneration, bilateral, early dry stage: Secondary | ICD-10-CM | POA: Diagnosis not present

## 2017-12-29 DIAGNOSIS — H40023 Open angle with borderline findings, high risk, bilateral: Secondary | ICD-10-CM | POA: Diagnosis not present

## 2017-12-29 DIAGNOSIS — H40003 Preglaucoma, unspecified, bilateral: Secondary | ICD-10-CM | POA: Diagnosis not present

## 2017-12-29 DIAGNOSIS — H25013 Cortical age-related cataract, bilateral: Secondary | ICD-10-CM | POA: Diagnosis not present

## 2017-12-29 DIAGNOSIS — H35372 Puckering of macula, left eye: Secondary | ICD-10-CM | POA: Diagnosis not present

## 2017-12-29 DIAGNOSIS — H31001 Unspecified chorioretinal scars, right eye: Secondary | ICD-10-CM | POA: Diagnosis not present

## 2017-12-29 DIAGNOSIS — S0500XA Injury of conjunctiva and corneal abrasion without foreign body, unspecified eye, initial encounter: Secondary | ICD-10-CM | POA: Diagnosis not present

## 2017-12-29 DIAGNOSIS — E119 Type 2 diabetes mellitus without complications: Secondary | ICD-10-CM | POA: Diagnosis not present

## 2017-12-29 DIAGNOSIS — H40053 Ocular hypertension, bilateral: Secondary | ICD-10-CM | POA: Diagnosis not present

## 2017-12-29 DIAGNOSIS — H04123 Dry eye syndrome of bilateral lacrimal glands: Secondary | ICD-10-CM | POA: Diagnosis not present

## 2017-12-29 LAB — HM DIABETES EYE EXAM

## 2017-12-30 ENCOUNTER — Encounter (HOSPITAL_COMMUNITY): Payer: Medicare Other

## 2017-12-31 LAB — HM DIABETES EYE EXAM

## 2018-01-02 ENCOUNTER — Encounter (HOSPITAL_COMMUNITY)
Admission: RE | Admit: 2018-01-02 | Discharge: 2018-01-02 | Disposition: A | Discharge status: Home / Self Care | Payer: Medicare Other | Source: Ambulatory Visit | Attending: Cardiology | Admitting: Cardiology

## 2018-01-02 DIAGNOSIS — I5032 Chronic diastolic (congestive) heart failure: Secondary | ICD-10-CM | POA: Diagnosis not present

## 2018-01-02 DIAGNOSIS — Z79899 Other long term (current) drug therapy: Secondary | ICD-10-CM | POA: Insufficient documentation

## 2018-01-02 DIAGNOSIS — E785 Hyperlipidemia, unspecified: Secondary | ICD-10-CM | POA: Diagnosis not present

## 2018-01-02 DIAGNOSIS — I110 Hypertensive heart disease with heart failure: Secondary | ICD-10-CM | POA: Diagnosis not present

## 2018-01-02 DIAGNOSIS — Z7982 Long term (current) use of aspirin: Secondary | ICD-10-CM | POA: Insufficient documentation

## 2018-01-02 DIAGNOSIS — G4733 Obstructive sleep apnea (adult) (pediatric): Secondary | ICD-10-CM | POA: Insufficient documentation

## 2018-01-02 DIAGNOSIS — K219 Gastro-esophageal reflux disease without esophagitis: Secondary | ICD-10-CM | POA: Diagnosis not present

## 2018-01-02 DIAGNOSIS — E119 Type 2 diabetes mellitus without complications: Secondary | ICD-10-CM | POA: Diagnosis not present

## 2018-01-02 DIAGNOSIS — I208 Other forms of angina pectoris: Secondary | ICD-10-CM | POA: Diagnosis not present

## 2018-01-02 DIAGNOSIS — Z794 Long term (current) use of insulin: Secondary | ICD-10-CM | POA: Diagnosis not present

## 2018-01-04 ENCOUNTER — Encounter (HOSPITAL_COMMUNITY)
Admission: RE | Admit: 2018-01-04 | Discharge: 2018-01-04 | Disposition: A | Discharge status: Home / Self Care | Payer: Medicare Other | Source: Ambulatory Visit | Attending: Cardiology | Admitting: Cardiology

## 2018-01-04 ENCOUNTER — Encounter (HOSPITAL_COMMUNITY): Payer: Medicare Other

## 2018-01-04 ENCOUNTER — Ambulatory Visit: Payer: Medicare Other

## 2018-01-04 ENCOUNTER — Encounter: Payer: Self-pay | Admitting: *Deleted

## 2018-01-04 DIAGNOSIS — I208 Other forms of angina pectoris: Secondary | ICD-10-CM

## 2018-01-04 DIAGNOSIS — I110 Hypertensive heart disease with heart failure: Secondary | ICD-10-CM | POA: Diagnosis not present

## 2018-01-04 DIAGNOSIS — E119 Type 2 diabetes mellitus without complications: Secondary | ICD-10-CM | POA: Diagnosis not present

## 2018-01-04 DIAGNOSIS — Z7982 Long term (current) use of aspirin: Secondary | ICD-10-CM | POA: Diagnosis not present

## 2018-01-04 DIAGNOSIS — Z794 Long term (current) use of insulin: Secondary | ICD-10-CM | POA: Diagnosis not present

## 2018-01-04 DIAGNOSIS — Z79899 Other long term (current) drug therapy: Secondary | ICD-10-CM | POA: Diagnosis not present

## 2018-01-05 ENCOUNTER — Encounter: Payer: Self-pay | Admitting: Internal Medicine

## 2018-01-06 ENCOUNTER — Encounter (HOSPITAL_COMMUNITY): Payer: Medicare Other

## 2018-01-06 ENCOUNTER — Encounter (HOSPITAL_COMMUNITY): Admission: RE | Admit: 2018-01-06 | Payer: Medicare Other | Source: Ambulatory Visit

## 2018-01-09 ENCOUNTER — Encounter (HOSPITAL_COMMUNITY)
Admission: RE | Admit: 2018-01-09 | Discharge: 2018-01-09 | Disposition: A | Discharge status: Home / Self Care | Payer: Medicare Other | Source: Ambulatory Visit | Attending: Cardiology | Admitting: Cardiology

## 2018-01-09 DIAGNOSIS — I208 Other forms of angina pectoris: Secondary | ICD-10-CM

## 2018-01-09 DIAGNOSIS — E119 Type 2 diabetes mellitus without complications: Secondary | ICD-10-CM | POA: Diagnosis not present

## 2018-01-09 DIAGNOSIS — I110 Hypertensive heart disease with heart failure: Secondary | ICD-10-CM | POA: Diagnosis not present

## 2018-01-09 DIAGNOSIS — Z79899 Other long term (current) drug therapy: Secondary | ICD-10-CM | POA: Diagnosis not present

## 2018-01-09 DIAGNOSIS — Z794 Long term (current) use of insulin: Secondary | ICD-10-CM | POA: Diagnosis not present

## 2018-01-09 DIAGNOSIS — I2089 Other forms of angina pectoris: Secondary | ICD-10-CM

## 2018-01-09 DIAGNOSIS — Z7982 Long term (current) use of aspirin: Secondary | ICD-10-CM | POA: Diagnosis not present

## 2018-01-11 ENCOUNTER — Encounter (HOSPITAL_COMMUNITY): Payer: Medicare Other

## 2018-01-13 ENCOUNTER — Encounter (HOSPITAL_COMMUNITY): Payer: Self-pay

## 2018-01-13 ENCOUNTER — Encounter (HOSPITAL_COMMUNITY): Payer: Medicare Other

## 2018-01-13 ENCOUNTER — Encounter (HOSPITAL_COMMUNITY)
Admission: RE | Admit: 2018-01-13 | Discharge: 2018-01-13 | Disposition: A | Discharge status: Home / Self Care | Payer: Medicare Other | Source: Ambulatory Visit | Attending: Cardiology | Admitting: Cardiology

## 2018-01-13 VITALS — Ht 61.0 in | Wt 218.7 lb

## 2018-01-13 DIAGNOSIS — Z794 Long term (current) use of insulin: Secondary | ICD-10-CM | POA: Diagnosis not present

## 2018-01-13 DIAGNOSIS — E119 Type 2 diabetes mellitus without complications: Secondary | ICD-10-CM | POA: Diagnosis not present

## 2018-01-13 DIAGNOSIS — I208 Other forms of angina pectoris: Secondary | ICD-10-CM

## 2018-01-13 DIAGNOSIS — I110 Hypertensive heart disease with heart failure: Secondary | ICD-10-CM | POA: Diagnosis not present

## 2018-01-13 DIAGNOSIS — I2089 Other forms of angina pectoris: Secondary | ICD-10-CM

## 2018-01-13 DIAGNOSIS — Z79899 Other long term (current) drug therapy: Secondary | ICD-10-CM | POA: Diagnosis not present

## 2018-01-13 DIAGNOSIS — Z7982 Long term (current) use of aspirin: Secondary | ICD-10-CM | POA: Diagnosis not present

## 2018-01-16 ENCOUNTER — Encounter (HOSPITAL_COMMUNITY): Payer: Medicare Other

## 2018-01-18 ENCOUNTER — Encounter (HOSPITAL_COMMUNITY): Payer: Medicare Other

## 2018-01-20 ENCOUNTER — Encounter (HOSPITAL_COMMUNITY): Payer: Medicare Other

## 2018-01-23 ENCOUNTER — Encounter (HOSPITAL_COMMUNITY): Payer: Medicare Other

## 2018-01-25 ENCOUNTER — Encounter (HOSPITAL_COMMUNITY): Payer: Medicare Other

## 2018-01-27 ENCOUNTER — Encounter (HOSPITAL_COMMUNITY): Payer: Medicare Other

## 2018-01-30 ENCOUNTER — Encounter (HOSPITAL_COMMUNITY): Payer: Medicare Other

## 2018-02-01 ENCOUNTER — Encounter (HOSPITAL_COMMUNITY): Payer: Medicare Other

## 2018-02-03 ENCOUNTER — Encounter (HOSPITAL_COMMUNITY): Payer: Medicare Other

## 2018-02-06 NOTE — Addendum Note (Signed)
Encounter addended by: Dorna Bloom D on: 02/06/2018 4:37 PM  Actions taken: Visit Navigator Flowsheet section accepted

## 2018-02-07 ENCOUNTER — Encounter (INDEPENDENT_AMBULATORY_CARE_PROVIDER_SITE_OTHER): Payer: Self-pay

## 2018-02-07 ENCOUNTER — Other Ambulatory Visit: Payer: Self-pay

## 2018-02-07 ENCOUNTER — Ambulatory Visit (INDEPENDENT_AMBULATORY_CARE_PROVIDER_SITE_OTHER): Payer: Medicare Other | Admitting: Internal Medicine

## 2018-02-07 ENCOUNTER — Encounter: Payer: Self-pay | Admitting: Pharmacist

## 2018-02-07 DIAGNOSIS — Z7982 Long term (current) use of aspirin: Secondary | ICD-10-CM | POA: Diagnosis not present

## 2018-02-07 DIAGNOSIS — Z853 Personal history of malignant neoplasm of breast: Secondary | ICD-10-CM | POA: Diagnosis not present

## 2018-02-07 DIAGNOSIS — N61 Mastitis without abscess: Secondary | ICD-10-CM | POA: Diagnosis present

## 2018-02-07 NOTE — Patient Instructions (Addendum)
FOLLOW-UP INSTRUCTIONS When: On June 20th or 27th with Dr. Lynnae January For: For a routine visit and evaluation of your diabetes What to bring: All of your medications  Thank you for your visit to [name], [facility] today. At this time we feel as if your symptoms were most likely related to medication side effect versus soft tissue infection that has cleared.  Given that the swelling has greatly improved we are less concerned for malignancy or other more serious condition at this time.  If the swelling persists, worsens, or you develop other concerning symptoms please notify us and schedule an appointment for as soon as possible.

## 2018-02-07 NOTE — Assessment & Plan Note (Signed)
Left breast swelling: Patient presents with a 5-day history of swelling, erythema, and rippling of the skin of the left breast following placement of nitroglycerin patch her left upper breast.  Patient typically experiences irritation secondary to her nitro glycerin patches but not to this extent.  She states that she scratched the affected area extensively with him to spread and worsening of the erythema, edema.  This resulted in marked swelling of the left breast that is not been noticed in the past.  However, her symptoms improved following removal of the patch without treatment.  By the time of presentation her symptoms have almost all but resolved with a small 7 x 7 cm patch of mild erythema with wrinkling of the skin to the right of the left nipple and left of the sternum of the left breast.  This area, as per patient, represented exactly the visual effect of her entire left breast following the patch scratching. Given her recent negative screening mammogram in September 2018 and CT angios November 2018 with today's evaluation I do not feel as if this is a recurrence of her previous malignancy given the improvement in his symptoms with time as well.  Plan: We recommend close follow-up with her primary care provider her next available appointment in June 20 or 27 Patient was advised to call us if she develops other concerning symptoms including fever, swelling of the breast, recurrence of the symptoms, or any other concerning feature She is advised to place the patch at an alternative location in the future

## 2018-02-07 NOTE — Progress Notes (Signed)
CC: Left breast swelling  HPI:[name] is a 67 y.o. female who is here today for evaluation of swelling of her left breast.  Left breast swelling: Patient presents with a 5-day history of swelling, erythema, and rippling of the skin of the left breast following placement of nitroglycerin patch her left upper breast.  Patient typically experiences irritation secondary to her nitro glycerin patches but not to this extent.  She states that she scratched the affected area extensively with him to spread and worsening of the erythema, edema.  This resulted in marked swelling of the left breast that is not been noticed in the past.  However, her symptoms improved following removal of the patch without treatment.  By the time of presentation her symptoms have almost all but resolved with a small 7 x 7 cm patch of mild erythema with wrinkling of the skin to the right of the left nipple and left of the sternum of the left breast.  This area, as per patient, represented exactly the visual effect of her entire left breast following the patch scratching. Given her recent negative screening mammogram in September 2018 and CT angios November 2018 with today's evaluation I do not feel as if this is a recurrence of her previous malignancy given the improvement in his symptoms with time as well.  Plan: We recommend close follow-up with her primary care provider her next available appointment in June 20 or 27 Patient was advised to call us if she develops other concerning symptoms including fever, swelling of the breast, recurrence of the symptoms, or any other concerning feature She is advised to place the patch at an alternative location in the future  Past Medical History:  Diagnosis Date  . Arthritis    "knees, lower back" (08/18/2017)  . 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 S/p lumpectomy and radiation.  Marland Kitchen CAD (coronary artery disease) 11/07   cath 11/11 :  3V CADsee report   . Cataract    small both eyes  . Chronic lower back pain   . Chronic pain    MR 08/07/10 :  Spondylosis L4-5 with B foraminal narrowing and L4 nerve root enchroachment B.  . Chronic venous insufficiency 2012   Has had full W/U incl ECHO, LFT's, Cr, Umicro, TSH, BNP. Symptomatic treatment.  . Colonic polyp 1995   Colonoscopies 1994, 2000, and 02/02/2011. Last had 9 polyps - Tubular adenoma and tubulovillous was the pathology results without high grade dysplasia  . Diabetes mellitus type II    Insulin dependent. Has worked with [name] R previously.  . Diastolic CHF, chronic ([place])    NL EF by echo 01/2012, Gr I dd  . Essential hypertension    Requires 5 drug therapy and still difficult to control.  Marland Kitchen GERD (gastroesophageal reflux disease)   . Heart murmur   . Hyperlipidemia    On daily statin  . Iron deficiency anemia    Ferritin was 12 in 2012. on FeSo4. Has had colon and EGD 02/02/11 that showed mild gastritis and colon polyps.  . Obesity    Morbid. HAs worked with [name] T previously.  . OSA (obstructive sleep apnea) 02/16/2007   02/2007 : Moderate AHI 35.6, O2 sat decreased to 65%, CPAP titration to 16 with AHI of 3.6. 02/2014 : Rare respiratory events with sleep disturbance, within normal limits. AHI 0.4 per hour      . OSA on CPAP   .  Personal history of radiation therapy    "to my left breast"  . Refusal of blood transfusions as patient is Jehovah's Witness   . Seasonal allergies    "grass pollen"   Review of Systems:  Review of Systems  Constitutional: Negative for chills, diaphoresis and fever.  HENT: Negative for ear pain and sinus pain.   Eyes: Negative for blurred vision, photophobia and redness.  Respiratory: Negative for cough and shortness of breath.   Cardiovascular: Negative for chest pain and leg swelling.  Gastrointestinal: Negative for constipation, diarrhea, nausea and vomiting.  Genitourinary: Negative for flank pain and urgency.    Musculoskeletal: Negative for myalgias.       Left breast pain/edema as per HPI.  Neurological: Negative for dizziness and headaches.  Psychiatric/Behavioral: Negative for depression. The patient is not nervous/anxious.     Physical Exam:  Vitals:   02/07/18 0853  BP: (!) 151/73  Pulse: 71  Temp: 98.2 F (36.8 C)  TempSrc: Oral  SpO2: 100%  Weight: 215 lb 11.2 oz (97.8 kg)  Height: 5\' 1"  (1.549 m)   Physical Exam  Constitutional: She appears well-developed and well-nourished. No distress.  HENT:  Head: Normocephalic and atraumatic.  Cardiovascular: Normal rate and regular rhythm.  No murmur heard. Pulmonary/Chest: Effort normal and breath sounds normal. No stridor. No respiratory distress.  Abdominal: Soft. Bowel sounds are normal. She exhibits no distension. There is no tenderness.  Musculoskeletal: She exhibits edema, tenderness (To palpation of the left breast below the affected site. ) and deformity.  Skin: She is not diaphoretic.  Vitals reviewed.   Assessment & Plan:   See Encounters Tab for problem based charting.  Patient seen with Dr. Angelia Mould

## 2018-02-08 NOTE — Progress Notes (Signed)
Internal Medicine Clinic Attending  I saw and evaluated the patient.  I personally confirmed the key portions of the history and exam documented by Dr. Berline Lopes and I reviewed pertinent patient test results.  The assessment, diagnosis, and plan were formulated together and I agree with the documentation in the resident's note. Patient does still have some induration of the medial aspect of her left breast (10oclock position) she is very clear that this was due to her NTG patch and is resolving, in that case we will plan to follow this clinically, we have arranged for follow up with her PCP next month.  She will call with any concerns or worsening of swelling.

## 2018-02-08 NOTE — Addendum Note (Signed)
Encounter addended by: Jewel Baize, RD on: 02/08/2018 1:51 PM  Actions taken: Flowsheet data copied forward, Visit Navigator Flowsheet section accepted

## 2018-02-15 ENCOUNTER — Encounter: Payer: Self-pay | Admitting: Dietician

## 2018-02-15 ENCOUNTER — Telehealth: Payer: Self-pay | Admitting: Dietician

## 2018-02-15 NOTE — Telephone Encounter (Signed)
Patent agreed to attend group diabetes meeting tomorrow. Her  Daughter will come with her.

## 2018-02-16 ENCOUNTER — Ambulatory Visit (INDEPENDENT_AMBULATORY_CARE_PROVIDER_SITE_OTHER): Payer: Medicare Other | Admitting: Dietician

## 2018-02-16 ENCOUNTER — Other Ambulatory Visit: Payer: Self-pay | Admitting: Dietician

## 2018-02-16 ENCOUNTER — Other Ambulatory Visit: Payer: Self-pay

## 2018-02-16 ENCOUNTER — Encounter: Payer: Self-pay | Admitting: Dietician

## 2018-02-16 DIAGNOSIS — Z713 Dietary counseling and surveillance: Secondary | ICD-10-CM

## 2018-02-16 DIAGNOSIS — E1140 Type 2 diabetes mellitus with diabetic neuropathy, unspecified: Secondary | ICD-10-CM | POA: Diagnosis not present

## 2018-02-16 DIAGNOSIS — Z794 Long term (current) use of insulin: Secondary | ICD-10-CM | POA: Diagnosis not present

## 2018-02-16 DIAGNOSIS — Z6839 Body mass index (BMI) 39.0-39.9, adult: Secondary | ICD-10-CM

## 2018-02-16 NOTE — Progress Notes (Signed)
  Medical Nutrition Therapy:  Appt start time: 8250 end time:  1050 Visit # 4   Assessment:  Primary concerns today: glycemic control, meal planning. Patient seen today in group Diabetes meeting for 60 minutes.  She graduated cardiac rehab.The program included successes and challenges over the past few weeks, and how to include and make healthy snacks like popcorn.  This activity was done because of her questions about why popcorn was increasing her fasting blood sugars.   MEDICATIONS: 35 units toujeo, prandin 4 mg daily  Breakfast & lunch and stopped the one with her evening meal, metformin 1000 mg twice a day  BLOOD SUGAR: Lab Results  Component Value Date   HGBA1C 7.9 11/04/2017    ANTHROPOMETRICS:  Wt Readings from Last 3 Encounters:  02/16/18 209 lb (94.8 kg)  02/07/18 215 lb 11.2 oz (97.8 kg)  01/27/18 218 lb 11.1 oz (99.2 kg)   DIETARY INTAKE: She did not bring her log today, but says she thinks most spikes are from bread or crackers,  when Dining Out: a few times a week  Usual physical activity: walks as much as possible uses gym where she lives and does back exercises at night sometimes.   Progress Towards Goal(s):  Some progress.   Nutritional Diagnosis:    [place] 2.2 altered nutrition related laboratory values of A1C of >7% as related to higher blood sugar has improved as evidenced her A1C of 7.9%.(decreased from 8.8%) and her improved blood sugars.   Intervention:  Nutrition education about how to make healthier options of popcorn, how fat can affect blood sugars Coordination of care: CGM quarterly would be helpful for this patient- next time ~ April 2019  Teaching Method Utilized: Visual, Auditory, Hands on Handouts given during visit include: nutritional info on popcorn n  Barriers to learning/adherence to lifestyle change: competing values Demonstrated degree of understanding via:  Teach Back   Monitoring/Evaluation:  Dietary intake, exercise, meter, and body weight   [name], RD 02/16/2018 5:09 PM.   .

## 2018-02-16 NOTE — Patient Instructions (Signed)
Good job on lowering your weight and A1C at the same time!  I hope you enjoyed the popcorn information.   Please keep asking questions.  See you in June.  [name] [phone]

## 2018-02-17 NOTE — Addendum Note (Signed)
Encounter addended by: Lowell Guitar, RN on: 02/17/2018 4:10 PM  Actions taken: Sign clinical note

## 2018-02-17 NOTE — Addendum Note (Signed)
Encounter addended by: Lowell Guitar, RN on: 02/17/2018 4:12 PM  Actions taken: Episode resolved

## 2018-02-17 NOTE — Progress Notes (Signed)
Discharge Progress Report  Patient Details  Name: [name] MRN: [id number] Date of Birth: 06-23-1951 Referring Provider:   Flowsheet Row CARDIAC REHAB PHASE II ORIENTATION from 10/25/2017 in [facility]  Referring Provider  Minus Breeding, MD.       Number of Visits: 25  Reason for Discharge:  Patient reached a stable level of exercise.  Smoking History:  Social History   Tobacco Use  Smoking Status Never Smoker  Smokeless Tobacco Never Used    Diagnosis:  Stable angina ([place])  ADL [facility]:   Initial Exercise Prescription: Initial Exercise Prescription - 10/25/17 1000    Date of Initial Exercise RX and Referring Provider          Date  10/25/17    Referring Provider  Minus Breeding, MD.        Bike          Level  0.6    Minutes  10    METs  2.13        NuStep          Level  3    SPM  85    Minutes  10    METs  2.2        Track          Laps  8    Minutes  10    METs  2.39        Prescription Details          Frequency (times per week)  3    Duration  Progress to 30 minutes of continuous aerobic without signs/symptoms of physical distress        Intensity          THRR 40-80% of Max Heartrate  62-123    Ratings of Perceived Exertion  11-13    Perceived Dyspnea  0-4        Progression          Progression  Continue to progress workloads to maintain intensity without signs/symptoms of physical distress.        Resistance Training          Training Prescription  Yes    Weight  3lbs    Reps  10-15           Discharge Exercise Prescription (Final Exercise Prescription Changes): Exercise Prescription Changes - 01/13/18 1221    Response to Exercise          Blood Pressure (Admit)  110/65    Blood Pressure (Exercise)  140/70    Blood Pressure (Exit)  118/74    Heart Rate (Admit)  93 bpm    Heart Rate (Exercise)  125 bpm    Heart Rate (Exit)  82 bpm    Rating of Perceived Exertion (Exercise)   11    Perceived Dyspnea (Exercise)  0    Symptoms  None    Duration  Continue with 30 min of aerobic exercise without signs/symptoms of physical distress.    Intensity  THRR unchanged        Progression          Progression  Continue to progress workloads to maintain intensity without signs/symptoms of physical distress.    Average METs  2.4        Resistance Training          Training Prescription  Yes    Weight  4lbs    Reps  10-15    Time  10 Minutes        NuStep          Level  4    SPM  85    Minutes  10    METs  15        Arm Ergometer          Level  3    Minutes  15    METs  3        Home Exercise Plan          Plans to continue exercise at  Home (comment) Walking     Frequency  Add 2 additional days to program exercise sessions.    Initial Home Exercises Provided  11/16/16           Functional Capacity: 6 Minute Walk    6 Minute Walk    Row Name 10/25/17 1037 01/13/18 1510 01/13/18 1530   Phase  Initial  Discharge  (Pended)   no documentation   Distance  1067 feet  1200 feet  (Pended)   1200 feet   Distance Feet Change  no documentation  133 ft  (Pended)   133 ft   Walk Time  6 minutes  no documentation  (Pended)  5 minutes 10 seconds  6 minutes   # of Rest Breaks  0  2  (Pended)  1-standing 30 second break, 1-seated 20 second break  no documentation 1- standing 30 seconds 2- seated 20 seconds   MPH  2.02  2.27  (Pended)   2.27   METS  2.08  2.76  (Pended)   2.76   RPE  8  11  (Pended)   11   VO2 Peak  7.27  no documentation  8.03   Symptoms  No  no documentation  Yes (comment)   Comments  no documentation  no documentation  6/10 back pain   Resting HR  66 bpm  no documentation  81 bpm   Resting BP  128/70  no documentation  112/70   Resting Oxygen Saturation   97 %  no documentation  no documentation   Exercise Oxygen Saturation  during 6 min walk  98 %  no documentation  no documentation   Max Ex. HR  101 bpm  no documentation  108 bpm    Max Ex. BP  150/78  no documentation  138/80   2 Minute Post BP  128/62  no documentation  no documentation          Psychological, QOL, Others - Outcomes: PHQ 2/9: Depression screen [facility] 2/9 02/07/2018 01/13/2018 12/22/2017 11/04/2017 11/02/2017  Decreased Interest 0 0 0 0 0  Down, Depressed, Hopeless 0 0 0 0 0  PHQ - 2 Score 0 0 0 0 0  Altered sleeping - - - - -  Tired, decreased energy - - - - -  Change in appetite - - - - -  Feeling bad or failure about yourself  - - - - -  Trouble concentrating - - - - -  Moving slowly or fidgety/restless - - - - -  Suicidal thoughts - - - - -  PHQ-9 Score - - - - -  Difficult doing work/chores - - - - -  Some recent data might be hidden    Quality of Life: Quality of Life - 01/27/18 1548    Quality of Life Scores          Health/Function Pre  26.43 %  Health/Function Post  27.1 %    Health/Function % Change  2.53 %    Socioeconomic Pre  29.58 %    Socioeconomic Post  28.07 %    Socioeconomic % Change   -5.1 %    Psych/Spiritual Pre  30 %    Psych/Spiritual Post  30 %    Psych/Spiritual % Change  0 %    Family Pre  27.5 %    Family Post  24.38 %    Family % Change  -11.35 %    GLOBAL Pre  28 %    GLOBAL Post  27.59 %    GLOBAL % Change  -1.46 %           Personal Goals: Goals established at orientation with interventions provided to work toward goal. Personal Goals and Risk Factors at Admission - 10/25/17 1035    Core Components/Risk Factors/Patient Goals on Admission           Weight Management  Obesity;Yes    Intervention  Weight Management/Obesity: Establish reasonable short term and long term weight goals.;Obesity: Provide education and appropriate resources to help participant work on and attain dietary goals.    Admit Weight  219 lb 2.2 oz (99.4 kg)    Expected Outcomes  Weight Loss: Understanding of general recommendations for a balanced deficit meal plan, which promotes 1-2 lb weight loss per week and includes a  negative energy balance of [phone] kcal/d    Diabetes  Yes    Intervention  Provide education about signs/symptoms and action to take for hypo/hyperglycemia.;Provide education about proper nutrition, including hydration, and aerobic/resistive exercise prescription along with prescribed medications to achieve blood glucose in normal ranges: Fasting glucose 65-99 mg/dL    Expected Outcomes  Short Term: Participant verbalizes understanding of the signs/symptoms and immediate care of hyper/hypoglycemia, proper foot care and importance of medication, aerobic/resistive exercise and nutrition plan for blood glucose control.;Long Term: Attainment of HbA1C < 7%.    Hypertension  Yes    Intervention  Provide education on lifestyle modifcations including regular physical activity/exercise, weight management, moderate sodium restriction and increased consumption of fresh fruit, vegetables, and low fat dairy, alcohol moderation, and smoking cessation.;Monitor prescription use compliance.    Expected Outcomes  Short Term: Continued assessment and intervention until BP is < 140/9mm HG in hypertensive participants. < 130/42mm HG in hypertensive participants with diabetes, heart failure or chronic kidney disease.;Long Term: Maintenance of blood pressure at goal levels.    Lipids  Yes    Intervention  Provide education and support for participant on nutrition & aerobic/resistive exercise along with prescribed medications to achieve LDL 70mg , HDL >40mg .    Expected Outcomes  Short Term: Participant states understanding of desired cholesterol values and is compliant with medications prescribed. Participant is following exercise prescription and nutrition guidelines.;Long Term: Cholesterol controlled with medications as prescribed, with individualized exercise RX and with personalized nutrition plan. Value goals: LDL < 70mg , HDL > 40 mg.            Personal Goals Discharge: Goals and Risk Factor Review    Core  Components/Risk Factors/Patient Goals Review    Row Name 11/02/17 1500 11/16/17 1109 11/18/17 1208 12/13/17 1348 01/13/18 1539   Personal Goals Review  Diabetes;Weight Management/Obesity;Hypertension;Lipids  Diabetes;Weight Management/Obesity;Hypertension;Lipids  no documentation  Diabetes;Weight Management/Obesity;Hypertension;Lipids  Diabetes;Weight Management/Obesity;Hypertension;Lipids   Review  pt with multiple CAD RF demonstrates willingness to participate in CR program.   pt with multiple CAD RF demonstrates willingness to participate in  CR program. pt demonstrates good blood pressure and blood sugar control at cardiac rehab.  no documentation  pt with multiple CAD RF demonstrates willingness to participate in CR program. pt demonstrates good blood pressure and blood sugar control at cardiac rehab. pt verbalizes enjoyment in lifestyle modification education classes.    pt demonstrates good blood pressure and blood sugar control at cardiac rehab. pt verbalizes enjoyment in lifestyle modification education classes. pt has decresaed symptoms without angina.  pt is pleased with her friendships made and health tips she has learned during rehab. pt    Expected Outcomes  pt will participate in CR exercise, nutrition and lifestyle modification education to decrease overall RF.    pt will participate in CR exercise, nutrition and lifestyle modification education to decrease overall RF.    no documentation  pt will participate in CR exercise, nutrition and lifestyle modification education to decrease overall RF.    pt will participate in exercise, nutrition and lifestyle modification education to decrease overall RF.            Exercise Goals and Review: Exercise Goals    Exercise Goals    Row Name 10/25/17 1040   Increase Physical Activity  Yes   Intervention  Provide advice, education, support and counseling about physical activity/exercise needs.;Develop an individualized exercise prescription for  aerobic and resistive training based on initial evaluation findings, risk stratification, comorbidities and participant's personal goals.   Expected Outcomes  Short Term: Attend rehab on a regular basis to increase amount of physical activity.;Long Term: Exercising regularly at least 3-5 days a week.;Long Term: Add in home exercise to make exercise part of routine and to increase amount of physical activity.   Increase Strength and Stamina  Yes   Intervention  Provide advice, education, support and counseling about physical activity/exercise needs.;Develop an individualized exercise prescription for aerobic and resistive training based on initial evaluation findings, risk stratification, comorbidities and participant's personal goals.   Expected Outcomes  Short Term: Increase workloads from initial exercise prescription for resistance, speed, and METs.;Short Term: Perform resistance training exercises routinely during rehab and add in resistance training at home;Long Term: Improve cardiorespiratory fitness, muscular endurance and strength as measured by increased METs and functional capacity (6MWT)   Able to understand and use rate of perceived exertion (RPE) scale  Yes   Intervention  Provide education and explanation on how to use RPE scale   Expected Outcomes  Short Term: Able to use RPE daily in rehab to express subjective intensity level;Long Term:  Able to use RPE to guide intensity level when exercising independently   Able to understand and use Dyspnea scale  Yes   Intervention  Provide education and explanation on how to use Dyspnea scale   Expected Outcomes  Short Term: Able to use Dyspnea scale daily in rehab to express subjective sense of shortness of breath during exertion;Long Term: Able to use Dyspnea scale to guide intensity level when exercising independently   Knowledge and understanding of Target Heart Rate Range (THRR)  Yes   Intervention  Provide education and explanation of THRR  including how the numbers were predicted and where they are located for reference   Expected Outcomes  Short Term: Able to state/look up THRR;Long Term: Able to use THRR to govern intensity when exercising independently;Short Term: Able to use daily as guideline for intensity in rehab   Able to check pulse independently  Yes   Intervention  Provide education and demonstration on how to check  pulse in carotid and radial arteries.;Review the importance of being able to check your own pulse for safety during independent exercise   Expected Outcomes  Short Term: Able to explain why pulse checking is important during independent exercise;Long Term: Able to check pulse independently and accurately   Understanding of Exercise Prescription  Yes   Intervention  Provide education, explanation, and written materials on patient's individual exercise prescription   Expected Outcomes  Short Term: Able to explain program exercise prescription;Long Term: Able to explain home exercise prescription to exercise independently          Nutrition & Weight - Outcomes: Pre Biometrics - 01/27/18 1554    Pre Biometrics          Height  5\' 1"  (1.549 m)    Weight  218 lb 11.1 oz (99.2 kg)    Waist Circumference  45 inches    Hip Circumference  52 inches    Waist to Hip Ratio  0.87 %    BMI (Calculated)  41.34    Triceps Skinfold  53 mm    Grip Strength  34 kg    Flexibility  0 in    Single Leg Stand  4.02 seconds          Post Biometrics - 02/06/18 1622     Post  Biometrics          % Body Fat  54.1 %           Nutrition: Nutrition Therapy & Goals - 11/14/17 1419    Nutrition Therapy          Diet  Carb Modified, Heart Healthy        Personal Nutrition Goals          Nutrition Goal  Improved glycemic control as evidenced by pt's A1c trending from 8.8 to 7 or less.     Personal Goal #2  Pt to identify food quantities necessary to achieve weight loss of 6-24 lb (2.7-10.9 kg) at graduation from  cardiac rehab. Goal wt of 60 lb desired.        Intervention Plan          Intervention  Prescribe, educate and counsel regarding individualized specific dietary modifications aiming towards targeted core components such as weight, hypertension, lipid management, diabetes, heart failure and other comorbidities.    Expected Outcomes  Short Term Goal: Understand basic principles of dietary content, such as calories, fat, sodium, cholesterol and nutrients.;Long Term Goal: Adherence to prescribed nutrition plan.           Nutrition Discharge: Nutrition Assessments - 02/08/18 1346    MEDFICTS Scores          Pre Score  23    Post Score  15    Score Difference  -8           Education Questionnaire Score: Knowledge Questionnaire Score - 01/27/18 1549    Knowledge Questionnaire Score          Post Score  19/24           Goals reviewed with patient; copy given to patient.

## 2018-02-20 NOTE — Progress Notes (Signed)
Discharge Progress Report  Patient Details  Name: [name] MRN: [id number] Date of Birth: 07/25/1951 Referring Provider:   Flowsheet Row CARDIAC REHAB PHASE II ORIENTATION from 10/25/2017 in [place]  Referring Provider  Minus Breeding, MD.       Number of Visits: 25   Reason for Discharge:  Patient reached a stable level of exercise.  Smoking History:  Social History   Tobacco Use  Smoking Status Never Smoker  Smokeless Tobacco Never Used    Diagnosis:  Stable angina ([place])  ADL [facility]:   Initial Exercise Prescription: Initial Exercise Prescription - 10/25/17 1000    Date of Initial Exercise RX and Referring Provider          Date  10/25/17    Referring Provider  Minus Breeding, MD.        Bike          Level  0.6    Minutes  10    METs  2.13        NuStep          Level  3    SPM  85    Minutes  10    METs  2.2        Track          Laps  8    Minutes  10    METs  2.39        Prescription Details          Frequency (times per week)  3    Duration  Progress to 30 minutes of continuous aerobic without signs/symptoms of physical distress        Intensity          THRR 40-80% of Max Heartrate  62-123    Ratings of Perceived Exertion  11-13    Perceived Dyspnea  0-4        Progression          Progression  Continue to progress workloads to maintain intensity without signs/symptoms of physical distress.        Resistance Training          Training Prescription  Yes    Weight  3lbs    Reps  10-15           Discharge Exercise Prescription (Final Exercise Prescription Changes): Exercise Prescription Changes - 01/13/18 1221    Response to Exercise          Blood Pressure (Admit)  110/65    Blood Pressure (Exercise)  140/70    Blood Pressure (Exit)  118/74    Heart Rate (Admit)  93 bpm    Heart Rate (Exercise)  125 bpm    Heart Rate (Exit)  82 bpm    Rating of Perceived Exertion (Exercise)   11    Perceived Dyspnea (Exercise)  0    Symptoms  None    Duration  Continue with 30 min of aerobic exercise without signs/symptoms of physical distress.    Intensity  THRR unchanged        Progression          Progression  Continue to progress workloads to maintain intensity without signs/symptoms of physical distress.    Average METs  2.4        Resistance Training          Training Prescription  Yes    Weight  4lbs    Reps  10-15    Time  10 Minutes        NuStep          Level  4    SPM  85    Minutes  10    METs  15        Arm Ergometer          Level  3    Minutes  15    METs  3        Home Exercise Plan          Plans to continue exercise at  Home (comment) Walking     Frequency  Add 2 additional days to program exercise sessions.    Initial Home Exercises Provided  11/16/16           Functional Capacity: 6 Minute Walk    6 Minute Walk    Row Name 10/25/17 1037 01/13/18 1510 01/13/18 1530   Phase  Initial  Discharge  (Pended)   no documentation   Distance  1067 feet  1200 feet  (Pended)   1200 feet   Distance Feet Change  no documentation  133 ft  (Pended)   133 ft   Walk Time  6 minutes  no documentation  (Pended)  5 minutes 10 seconds  6 minutes   # of Rest Breaks  0  2  (Pended)  1-standing 30 second break, 1-seated 20 second break  no documentation 1- standing 30 seconds 2- seated 20 seconds   MPH  2.02  2.27  (Pended)   2.27   METS  2.08  2.76  (Pended)   2.76   RPE  8  11  (Pended)   11   VO2 Peak  7.27  no documentation  8.03   Symptoms  No  no documentation  Yes (comment)   Comments  no documentation  no documentation  6/10 back pain   Resting HR  66 bpm  no documentation  81 bpm   Resting BP  128/70  no documentation  112/70   Resting Oxygen Saturation   97 %  no documentation  no documentation   Exercise Oxygen Saturation  during 6 min walk  98 %  no documentation  no documentation   Max Ex. HR  101 bpm  no documentation  108 bpm    Max Ex. BP  150/78  no documentation  138/80   2 Minute Post BP  128/62  no documentation  no documentation          Psychological, QOL, Others - Outcomes: PHQ 2/9: Depression screen [facility] 2/9 02/07/2018 01/13/2018 12/22/2017 11/04/2017 11/02/2017  Decreased Interest 0 0 0 0 0  Down, Depressed, Hopeless 0 0 0 0 0  PHQ - 2 Score 0 0 0 0 0  Altered sleeping - - - - -  Tired, decreased energy - - - - -  Change in appetite - - - - -  Feeling bad or failure about yourself  - - - - -  Trouble concentrating - - - - -  Moving slowly or fidgety/restless - - - - -  Suicidal thoughts - - - - -  PHQ-9 Score - - - - -  Difficult doing work/chores - - - - -  Some recent data might be hidden    Quality of Life: Quality of Life - 01/27/18 1548    Quality of Life Scores          Health/Function Pre  26.43 %  Health/Function Post  27.1 %    Health/Function % Change  2.53 %    Socioeconomic Pre  29.58 %    Socioeconomic Post  28.07 %    Socioeconomic % Change   -5.1 %    Psych/Spiritual Pre  30 %    Psych/Spiritual Post  30 %    Psych/Spiritual % Change  0 %    Family Pre  27.5 %    Family Post  24.38 %    Family % Change  -11.35 %    GLOBAL Pre  28 %    GLOBAL Post  27.59 %    GLOBAL % Change  -1.46 %           Personal Goals: Goals established at orientation with interventions provided to work toward goal. Personal Goals and Risk Factors at Admission - 10/25/17 1035    Core Components/Risk Factors/Patient Goals on Admission           Weight Management  Obesity;Yes    Intervention  Weight Management/Obesity: Establish reasonable short term and long term weight goals.;Obesity: Provide education and appropriate resources to help participant work on and attain dietary goals.    Admit Weight  219 lb 2.2 oz (99.4 kg)    Expected Outcomes  Weight Loss: Understanding of general recommendations for a balanced deficit meal plan, which promotes 1-2 lb weight loss per week and includes a  negative energy balance of [phone] kcal/d    Diabetes  Yes    Intervention  Provide education about signs/symptoms and action to take for hypo/hyperglycemia.;Provide education about proper nutrition, including hydration, and aerobic/resistive exercise prescription along with prescribed medications to achieve blood glucose in normal ranges: Fasting glucose 65-99 mg/dL    Expected Outcomes  Short Term: Participant verbalizes understanding of the signs/symptoms and immediate care of hyper/hypoglycemia, proper foot care and importance of medication, aerobic/resistive exercise and nutrition plan for blood glucose control.;Long Term: Attainment of HbA1C < 7%.    Hypertension  Yes    Intervention  Provide education on lifestyle modifcations including regular physical activity/exercise, weight management, moderate sodium restriction and increased consumption of fresh fruit, vegetables, and low fat dairy, alcohol moderation, and smoking cessation.;Monitor prescription use compliance.    Expected Outcomes  Short Term: Continued assessment and intervention until BP is < 140/44mm HG in hypertensive participants. < 130/77mm HG in hypertensive participants with diabetes, heart failure or chronic kidney disease.;Long Term: Maintenance of blood pressure at goal levels.    Lipids  Yes    Intervention  Provide education and support for participant on nutrition & aerobic/resistive exercise along with prescribed medications to achieve LDL 70mg , HDL >40mg .    Expected Outcomes  Short Term: Participant states understanding of desired cholesterol values and is compliant with medications prescribed. Participant is following exercise prescription and nutrition guidelines.;Long Term: Cholesterol controlled with medications as prescribed, with individualized exercise RX and with personalized nutrition plan. Value goals: LDL < 70mg , HDL > 40 mg.            Personal Goals Discharge: Goals and Risk Factor Review    Core  Components/Risk Factors/Patient Goals Review    Row Name 11/02/17 1500 11/16/17 1109 11/18/17 1208 12/13/17 1348 01/13/18 1539   Personal Goals Review  Diabetes;Weight Management/Obesity;Hypertension;Lipids  Diabetes;Weight Management/Obesity;Hypertension;Lipids  no documentation  Diabetes;Weight Management/Obesity;Hypertension;Lipids  Diabetes;Weight Management/Obesity;Hypertension;Lipids   Review  pt with multiple CAD RF demonstrates willingness to participate in CR program.   pt with multiple CAD RF demonstrates willingness to participate in  CR program. pt demonstrates good blood pressure and blood sugar control at cardiac rehab.  no documentation  pt with multiple CAD RF demonstrates willingness to participate in CR program. pt demonstrates good blood pressure and blood sugar control at cardiac rehab. pt verbalizes enjoyment in lifestyle modification education classes.    pt demonstrates good blood pressure and blood sugar control at cardiac rehab. pt verbalizes enjoyment in lifestyle modification education classes. pt has decresaed symptoms without angina.  pt is pleased with her friendships made and health tips she has learned during rehab. pt    Expected Outcomes  pt will participate in CR exercise, nutrition and lifestyle modification education to decrease overall RF.    pt will participate in CR exercise, nutrition and lifestyle modification education to decrease overall RF.    no documentation  pt will participate in CR exercise, nutrition and lifestyle modification education to decrease overall RF.    pt will participate in exercise, nutrition and lifestyle modification education to decrease overall RF.            Exercise Goals and Review: Exercise Goals    Exercise Goals    Row Name 10/25/17 1040   Increase Physical Activity  Yes   Intervention  Provide advice, education, support and counseling about physical activity/exercise needs.;Develop an individualized exercise prescription for  aerobic and resistive training based on initial evaluation findings, risk stratification, comorbidities and participant's personal goals.   Expected Outcomes  Short Term: Attend rehab on a regular basis to increase amount of physical activity.;Long Term: Exercising regularly at least 3-5 days a week.;Long Term: Add in home exercise to make exercise part of routine and to increase amount of physical activity.   Increase Strength and Stamina  Yes   Intervention  Provide advice, education, support and counseling about physical activity/exercise needs.;Develop an individualized exercise prescription for aerobic and resistive training based on initial evaluation findings, risk stratification, comorbidities and participant's personal goals.   Expected Outcomes  Short Term: Increase workloads from initial exercise prescription for resistance, speed, and METs.;Short Term: Perform resistance training exercises routinely during rehab and add in resistance training at home;Long Term: Improve cardiorespiratory fitness, muscular endurance and strength as measured by increased METs and functional capacity (6MWT)   Able to understand and use rate of perceived exertion (RPE) scale  Yes   Intervention  Provide education and explanation on how to use RPE scale   Expected Outcomes  Short Term: Able to use RPE daily in rehab to express subjective intensity level;Long Term:  Able to use RPE to guide intensity level when exercising independently   Able to understand and use Dyspnea scale  Yes   Intervention  Provide education and explanation on how to use Dyspnea scale   Expected Outcomes  Short Term: Able to use Dyspnea scale daily in rehab to express subjective sense of shortness of breath during exertion;Long Term: Able to use Dyspnea scale to guide intensity level when exercising independently   Knowledge and understanding of Target Heart Rate Range (THRR)  Yes   Intervention  Provide education and explanation of THRR  including how the numbers were predicted and where they are located for reference   Expected Outcomes  Short Term: Able to state/look up THRR;Long Term: Able to use THRR to govern intensity when exercising independently;Short Term: Able to use daily as guideline for intensity in rehab   Able to check pulse independently  Yes   Intervention  Provide education and demonstration on how to check  pulse in carotid and radial arteries.;Review the importance of being able to check your own pulse for safety during independent exercise   Expected Outcomes  Short Term: Able to explain why pulse checking is important during independent exercise;Long Term: Able to check pulse independently and accurately   Understanding of Exercise Prescription  Yes   Intervention  Provide education, explanation, and written materials on patient's individual exercise prescription   Expected Outcomes  Short Term: Able to explain program exercise prescription;Long Term: Able to explain home exercise prescription to exercise independently          Nutrition & Weight - Outcomes: Pre Biometrics - 01/27/18 1554    Pre Biometrics          Height  5\' 1"  (1.549 m)    Weight  218 lb 11.1 oz (99.2 kg)    Waist Circumference  45 inches    Hip Circumference  52 inches    Waist to Hip Ratio  0.87 %    BMI (Calculated)  41.34    Triceps Skinfold  53 mm    Grip Strength  34 kg    Flexibility  0 in    Single Leg Stand  4.02 seconds          Post Biometrics - 02/06/18 1622     Post  Biometrics          % Body Fat  54.1 %           Nutrition: Nutrition Therapy & Goals - 11/14/17 1419    Nutrition Therapy          Diet  Carb Modified, Heart Healthy        Personal Nutrition Goals          Nutrition Goal  Improved glycemic control as evidenced by pt's A1c trending from 8.8 to 7 or less.     Personal Goal #2  Pt to identify food quantities necessary to achieve weight loss of 6-24 lb (2.7-10.9 kg) at graduation from  cardiac rehab. Goal wt of 60 lb desired.        Intervention Plan          Intervention  Prescribe, educate and counsel regarding individualized specific dietary modifications aiming towards targeted core components such as weight, hypertension, lipid management, diabetes, heart failure and other comorbidities.    Expected Outcomes  Short Term Goal: Understand basic principles of dietary content, such as calories, fat, sodium, cholesterol and nutrients.;Long Term Goal: Adherence to prescribed nutrition plan.           Nutrition Discharge: Nutrition Assessments - 02/08/18 1346    MEDFICTS Scores          Pre Score  23    Post Score  15    Score Difference  -8           Education Questionnaire Score: Knowledge Questionnaire Score - 01/27/18 1549    Knowledge Questionnaire Score          Post Score  19/24           Goals reviewed with patient; copy given to patient.

## 2018-02-20 NOTE — Addendum Note (Signed)
Encounter addended by: Lowell Guitar, RN on: 02/20/2018 10:00 AM  Actions taken: Order Reconciliation Section accessed, Sign clinical note

## 2018-02-21 ENCOUNTER — Telehealth: Payer: Self-pay | Admitting: Dietician

## 2018-02-21 MED ORDER — REPAGLINIDE 2 MG PO TABS: ORAL_TABLET | ORAL | 3 refills | Status: DC

## 2018-02-21 NOTE — Telephone Encounter (Signed)
[name] agrees to increase the prandin to 4mg  with breakfast (now 4mg  with all 3 meals) Advised her to check her blood sugar 2-4 hours post meal to be sure it is less than 180 mg/dl

## 2018-03-02 ENCOUNTER — Telehealth: Payer: Self-pay

## 2018-03-02 NOTE — Telephone Encounter (Signed)
Agree with appt need. She has had abscesses which would be tx with I&D and not just abx.

## 2018-03-02 NOTE — Telephone Encounter (Signed)
Pt calls and states L breast is worse, swollen, has hard areas, warmer to touch than R, red to dk [place] areas, painful, veins in L breast very distended, denies fever. She sounds very uncomfortable. She states she was in clinic 3 weeks ago and breast has gotten worse since that time. Tried to schedule appt for today she refused due to transportation, appt [facility] 5/31 at 1015

## 2018-03-02 NOTE — Telephone Encounter (Signed)
I agree that she will need to be seen prior to initiation of antibiotics as I am not sure this is an infection. She really needs to come in today or tomorrow morning early so we can take a look at the area. I recall the issue well so I will be sure and see her when she comes in.   Thank you

## 2018-03-02 NOTE — Telephone Encounter (Signed)
Requesting antibiotic for left breast pain and swollen. Offered an appt today, pt states she do not have transportation. Please call pt back.

## 2018-03-03 ENCOUNTER — Ambulatory Visit (INDEPENDENT_AMBULATORY_CARE_PROVIDER_SITE_OTHER): Payer: Medicare Other | Admitting: Internal Medicine

## 2018-03-03 ENCOUNTER — Other Ambulatory Visit: Payer: Self-pay | Admitting: Cardiology

## 2018-03-03 ENCOUNTER — Other Ambulatory Visit: Payer: Self-pay

## 2018-03-03 ENCOUNTER — Encounter: Payer: Self-pay | Admitting: Internal Medicine

## 2018-03-03 VITALS — BP 143/55 | HR 76 | Temp 98.2°F | Ht 61.0 in | Wt 212.7 lb

## 2018-03-03 DIAGNOSIS — Z842 Family history of other diseases of the genitourinary system: Secondary | ICD-10-CM | POA: Diagnosis not present

## 2018-03-03 DIAGNOSIS — N6322 Unspecified lump in the left breast, upper inner quadrant: Secondary | ICD-10-CM | POA: Diagnosis not present

## 2018-03-03 DIAGNOSIS — Z923 Personal history of irradiation: Secondary | ICD-10-CM | POA: Diagnosis not present

## 2018-03-03 DIAGNOSIS — Z853 Personal history of malignant neoplasm of breast: Secondary | ICD-10-CM | POA: Diagnosis not present

## 2018-03-03 DIAGNOSIS — N61 Mastitis without abscess: Secondary | ICD-10-CM

## 2018-03-03 DIAGNOSIS — Z840 Family history of diseases of the skin and subcutaneous tissue: Secondary | ICD-10-CM | POA: Diagnosis not present

## 2018-03-03 DIAGNOSIS — Z7982 Long term (current) use of aspirin: Secondary | ICD-10-CM | POA: Diagnosis not present

## 2018-03-03 NOTE — Patient Instructions (Addendum)
FOLLOW-UP INSTRUCTIONS When: if symptoms worsen or fail to improve  I have ordered a diagnostic mammogram to better evaluate your left breast. Please work closely with the schedulers to have this completed as soon as possible.    Thank you for your visit to the [facility] today.

## 2018-03-03 NOTE — Progress Notes (Signed)
 CC: left breast rash  HPI:[name] is a 67 y.o. female who presents today for evaluation of her left breast.   Left breast inflammation: Patient presented for reevaluation of breast irritation/inflammation. She was initially seen on 02/01/2018 and at that time her presentation was somewhat concerning for infectious vs malignant vs local irritation and underlying chronic changes. She was convinced at the time that the breast "mass" was due to prior radiation therapy and had been present for years. Given the improvement denoted since onset 5 days prior to her initial presentation and concern for topical irritant induced lymphedema she was released and told to follow if symptoms worsened.  She returns today as the swelling has increased and has now spread to more of her breast. There is a peau d'orange appearance to the skin medially, superiorly and laterally to the left areola that is most prominent at the 9-10 o'clock position to the nipple that is associated with erythema and marked induration. There is an underlying mass/soft tissue swelling, ie. lymphedema underlying this area that is tender to palpation. Given her history of breast cancer I am greatly concerned that this is a recurrence and less likely an infection/abscess.  The patient denies fever, chills, nausea, vomiting, headache, weight loss, visual changes, chest pain, abdominal pain, left flank pain, swelling of the left arm, tender lymph nodes in the neck or arm, diarrhea, constipation, myalgias, joint pain.  Prior pathology report in June 2006 indicated a high grade 1.5cm lesion with focal invasion that was excised. She subsequently underwent radiation therapy. Was seen at [facility] as per patient for second opinion. Patient is in denial that the removed lesion was cancerous. Unclear hormone marker pattern (Her2, ER/PR status)  Prior Surgeon was Dr. Bowman, Prior Radiation Oncologist Matt Manning, Primary oncologist Dr. Rubin.  FH 30+  yo sibling with cystic breast mass, mother with cystic breast mass in 70's, daughter with fibroids in 20's.  Plan: Diagnostic Digital Mammogram of the left breast placed STAT. Return is she develops other concerning symptoms such as but not limited to fever, chills, weakness, nausea, vomiting sweating unexpectedly, weight loss...  Past Medical History:  Diagnosis Date  . Arthritis    "knees, lower back" (08/18/2017)  . 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 S/p lumpectomy and radiation.  . CAD (coronary artery disease) 11/07   cath 11/11 :  3V CADsee report   . Cataract    small both eyes  . Chronic lower back pain   . Chronic pain    MR 08/07/10 :  Spondylosis L4-5 with B foraminal narrowing and L4 nerve root enchroachment B.  . Chronic venous insufficiency 2012   Has had full W/U incl ECHO, LFT's, Cr, Umicro, TSH, BNP. Symptomatic treatment.  . Colonic polyp 1995   Colonoscopies 1994, 2000, and 02/02/2011. Last had 9 polyps - Tubular adenoma and tubulovillous was the pathology results without high grade dysplasia  . Diabetes mellitus type II    Insulin dependent. Has worked with Donna R previously.  . Diastolic CHF, chronic (HCC)    NL EF by echo 01/2012, Gr I dd  . Essential hypertension    Requires 5 drug therapy and still difficult to control.  . GERD (gastroesophageal reflux disease)   . Heart murmur   . Hyperlipidemia    On daily statin  . Iron deficiency anemia    Ferritin was 12 in 2012. on FeSo4. Has had colon and EGD   02/02/11 that showed mild gastritis and colon polyps.  . Obesity    Morbid. HAs worked with [name] T previously.  . OSA (obstructive sleep apnea) 02/16/2007   02/2007 : Moderate AHI 35.6, O2 sat decreased to 65%, CPAP titration to 16 with AHI of 3.6. 02/2014 : Rare respiratory events with sleep disturbance, within normal limits. AHI 0.4 per hour      . OSA on CPAP   . Personal history of radiation therapy     "to my left breast"  . Refusal of blood transfusions as patient is Jehovah's Witness   . Seasonal allergies    "grass pollen"   Review of Systems:  ROS negative except as per HPI.  Physical Exam:  There were no vitals filed for this visit. Physical Exam  Constitutional: She is oriented to person, place, and time. She appears well-developed and well-nourished. No distress.  Eyes: Pupils are equal, round, and reactive to light. EOM are normal.  Neck: Normal range of motion. Neck supple.  Cardiovascular: Normal rate.  Pulmonary/Chest: Effort normal.  Abdominal: Soft. She exhibits no distension. There is no tenderness.  Musculoskeletal: She exhibits edema, tenderness (Of the left breast) and deformity.  Lymphadenopathy:    She has no cervical adenopathy.  Neurological: She is alert and oriented to person, place, and time.  Skin: Skin is warm. Rash noted. She is not diaphoretic. There is erythema (Left breast, see A/P).  Nursing note and vitals reviewed.  Assessment & Plan:   See Encounters Tab for problem based charting.  Patient seen with Dr. Beryle Beams

## 2018-03-03 NOTE — Progress Notes (Signed)
[phone] 

## 2018-03-03 NOTE — Progress Notes (Signed)
Medicine attending: I personally interviewed and briefly examined this patient on the day of the patient visit and reviewed pertinent clinical ,laboratory, and prior radiographic data  with resident physician Dr. Kathi Ludwig and we discussed a management plan. As noted above: hx of 1.9 cm invasive & intraductal breast cancer, high grade, ER/PR status not in record, S/P lumpectomy & radiation 03/2005. Now large approx 8 cm area of indurated, lymphedematous, tissue upper inner quadrant, erythematous streaking but no diffuse breast swelling or erythema to suggest inflammatory recurrence. Peau d'arange skin changes. No cervical, supraclavicular, or axillary adenopathy. No lymphedema of left arm. Impression:  High suspicion for recurrent ca; possibly in internal mammary nodes. Plan: immediate referral for mammography & biopsy. Patient in denial over initial diagnosis; may refuse biopsy at this time and is adamant that she will not take chemotherapy under any circumstances.

## 2018-03-03 NOTE — Assessment & Plan Note (Signed)
Left breast inflammation: Patient presented for reevaluation of breast irritation/inflammation. She was initially seen on 02/01/2018 and at that time her presentation was somewhat concerning for infectious vs malignant vs local irritation and underlying chronic changes. She was convinced at the time that the breast "mass" was due to prior radiation therapy and had been present for years. Given the improvement denoted since onset 5 days prior to her initial presentation and concern for topical irritant induced lymphedema she was released and told to follow if symptoms worsened.  She returns today as the swelling has increased and has now spread to more of her breast. There is a peau d'orange appearance to the skin medially, superiorly and laterally to the left areola that is most prominent at the 9-10 o'clock position to the nipple that is associated with erythema and marked induration. There is an underlying mass/soft tissue swelling, ie. lymphedema underlying this area that is tender to palpation. Given her history of breast cancer I am greatly concerned that this is a recurrence and less likely an infection/abscess.  The patient denies fever, chills, nausea, vomiting, headache, weight loss, visual changes, chest pain, abdominal pain, left flank pain, swelling of the left arm, tender lymph nodes in the neck or arm, diarrhea, constipation, myalgias, joint pain.  Prior pathology report in June 2006 indicated a high grade 1.5cm lesion with focal invasion that was excised. She subsequently underwent radiation therapy. Was seen at [facility] as per patient for second opinion. Patient is in denial that the removed lesion was cancerous. Unclear hormone marker pattern (Her2, ER/PR status)  Prior Surgeon was Dr. Deon Pilling, Prior Radiation Oncologist Ledon Snare, Primary oncologist Dr. Truddie Coco.  FH 30+ yo sibling with cystic breast mass, mother with cystic breast mass in 59's, daughter with fibroids in 20's.  Plan: Diagnostic  Digital Mammogram of the left breast placed STAT. Return is she develops other concerning symptoms such as but not limited to fever, chills, weakness, nausea, vomiting sweating unexpectedly, weight loss.Marland KitchenMarland Kitchen

## 2018-03-06 ENCOUNTER — Other Ambulatory Visit: Payer: Self-pay | Admitting: Internal Medicine

## 2018-03-06 DIAGNOSIS — N61 Mastitis without abscess: Secondary | ICD-10-CM

## 2018-03-08 ENCOUNTER — Telehealth: Payer: Self-pay | Admitting: Oncology

## 2018-03-08 ENCOUNTER — Other Ambulatory Visit: Payer: Self-pay | Admitting: Oncology

## 2018-03-08 DIAGNOSIS — N6320 Unspecified lump in the left breast, unspecified quadrant: Secondary | ICD-10-CM

## 2018-03-08 DIAGNOSIS — Z853 Personal history of malignant neoplasm of breast: Secondary | ICD-10-CM

## 2018-03-08 NOTE — Telephone Encounter (Signed)
Rec'd message from [facility] that this order needs to be E-signed by you as you have already Signed for the initial Mammogram on 03/06/2018.  Please advise if you can sign this one as well so, the patient can go ahead and get this scheduled as soon as possible.

## 2018-03-08 NOTE — Telephone Encounter (Signed)
I could not find a code for this so I put in one that I hope will work. If they don't like it, have them tell me what image code they want DrG

## 2018-03-10 NOTE — Telephone Encounter (Signed)
Pt was seen on 5/31 by Dr Berline Lopes.

## 2018-03-15 ENCOUNTER — Other Ambulatory Visit: Payer: Self-pay | Admitting: Internal Medicine

## 2018-03-15 ENCOUNTER — Ambulatory Visit
Admission: RE | Admit: 2018-03-15 | Discharge: 2018-03-15 | Disposition: A | Discharge status: Home / Self Care | Payer: Medicare Other | Source: Ambulatory Visit | Attending: Oncology | Admitting: Oncology

## 2018-03-15 DIAGNOSIS — N61 Mastitis without abscess: Secondary | ICD-10-CM

## 2018-03-15 DIAGNOSIS — N6320 Unspecified lump in the left breast, unspecified quadrant: Secondary | ICD-10-CM

## 2018-03-15 DIAGNOSIS — R922 Inconclusive mammogram: Secondary | ICD-10-CM | POA: Diagnosis not present

## 2018-03-16 ENCOUNTER — Emergency Department (HOSPITAL_COMMUNITY)
Admission: EM | Admit: 2018-03-16 | Discharge: 2018-03-17 | Disposition: A | Discharge status: Home / Self Care | Payer: Medicare Other | Attending: Emergency Medicine | Admitting: Emergency Medicine

## 2018-03-16 ENCOUNTER — Encounter (HOSPITAL_COMMUNITY): Payer: Self-pay

## 2018-03-16 ENCOUNTER — Other Ambulatory Visit: Payer: Self-pay

## 2018-03-16 DIAGNOSIS — I2510 Atherosclerotic heart disease of native coronary artery without angina pectoris: Secondary | ICD-10-CM | POA: Insufficient documentation

## 2018-03-16 DIAGNOSIS — I110 Hypertensive heart disease with heart failure: Secondary | ICD-10-CM | POA: Insufficient documentation

## 2018-03-16 DIAGNOSIS — Z79899 Other long term (current) drug therapy: Secondary | ICD-10-CM | POA: Diagnosis not present

## 2018-03-16 DIAGNOSIS — I5032 Chronic diastolic (congestive) heart failure: Secondary | ICD-10-CM | POA: Diagnosis not present

## 2018-03-16 DIAGNOSIS — Z7982 Long term (current) use of aspirin: Secondary | ICD-10-CM | POA: Insufficient documentation

## 2018-03-16 DIAGNOSIS — E119 Type 2 diabetes mellitus without complications: Secondary | ICD-10-CM | POA: Diagnosis not present

## 2018-03-16 DIAGNOSIS — N63 Unspecified lump in unspecified breast: Secondary | ICD-10-CM | POA: Insufficient documentation

## 2018-03-16 DIAGNOSIS — N644 Mastodynia: Secondary | ICD-10-CM | POA: Diagnosis not present

## 2018-03-16 DIAGNOSIS — Z7984 Long term (current) use of oral hypoglycemic drugs: Secondary | ICD-10-CM | POA: Diagnosis not present

## 2018-03-16 DIAGNOSIS — N6320 Unspecified lump in the left breast, unspecified quadrant: Secondary | ICD-10-CM

## 2018-03-16 DIAGNOSIS — J302 Other seasonal allergic rhinitis: Secondary | ICD-10-CM | POA: Diagnosis not present

## 2018-03-16 LAB — BASIC METABOLIC PANEL
ANION GAP: 11 (ref 5–15)
BUN: 9 mg/dL (ref 6–20)
CO2: 26 mmol/L (ref 22–32)
Calcium: 9.7 mg/dL (ref 8.9–10.3)
Chloride: 105 mmol/L (ref 101–111)
Creatinine, Ser: 0.75 mg/dL (ref 0.44–1.00)
GFR calc non Af Amer: >60 mL/min (ref >60)
Glucose, Bld: 126 mg/dL — ABNORMAL HIGH (ref 65–99)
POTASSIUM: 3.8 mmol/L (ref 3.5–5.1)
Sodium: 142 mmol/L (ref 135–145)

## 2018-03-16 LAB — CBC WITH DIFFERENTIAL/PLATELET
ABS IMMATURE GRANULOCYTES: 0 10*3/uL (ref 0.0–0.1)
Basophils Absolute: 0 10*3/uL (ref 0.0–0.1)
Basophils Relative: 0 %
Eosinophils Absolute: 0.3 10*3/uL (ref 0.0–0.7)
Eosinophils Relative: 4 %
HEMATOCRIT: 37 % (ref 36.0–46.0)
HEMOGLOBIN: 11.7 g/dL — AB (ref 12.0–15.0)
IMMATURE GRANULOCYTES: 0 %
LYMPHS ABS: 3.6 10*3/uL (ref 0.7–4.0)
LYMPHS PCT: 47 %
MCH: 28.7 pg (ref 26.0–34.0)
MCHC: 31.6 g/dL (ref 30.0–36.0)
MCV: 90.9 fL (ref 78.0–100.0)
MONOS PCT: 9 %
Monocytes Absolute: 0.6 10*3/uL (ref 0.1–1.0)
NEUTROS ABS: 3 10*3/uL (ref 1.7–7.7)
NEUTROS PCT: 40 %
Platelets: 232 10*3/uL (ref 150–400)
RBC: 4.07 MIL/uL (ref 3.87–5.11)
RDW: 14.1 % (ref 11.5–15.5)
WBC: 7.5 10*3/uL (ref 4.0–10.5)

## 2018-03-16 NOTE — ED Triage Notes (Signed)
Pt presents with redness and swelling to L nipple x 5 weeks.  Pt reports irritation began after placing a NTG patch to L chest with symptoms worsening since then.  Pt reports she has been back to PCP, requesting abx; had mammogram and ultrasound yesterday; pt was told she had a mass in L breast.  Pt reports blisters and red streaks to L breast but no drainage reported.

## 2018-03-17 ENCOUNTER — Telehealth: Payer: Self-pay | Admitting: Internal Medicine

## 2018-03-17 DIAGNOSIS — N63 Unspecified lump in unspecified breast: Secondary | ICD-10-CM | POA: Diagnosis not present

## 2018-03-17 MED ORDER — ACETAMINOPHEN-CODEINE #3 300-30 MG PO TABS
2.0000 | ORAL_TABLET | Freq: Once | ORAL | Status: AC
  Administered 2018-03-17: 2 via ORAL
  Filled 2018-03-17: qty 2

## 2018-03-17 MED ORDER — CEPHALEXIN 500 MG PO CAPS: 500.0000 mg | ORAL_CAPSULE | Freq: Two times a day (BID) | ORAL | 0 refills | Status: DC

## 2018-03-17 MED ORDER — CEPHALEXIN 250 MG PO CAPS
500.0000 mg | ORAL_CAPSULE | Freq: Once | ORAL | Status: AC
  Administered 2018-03-17: 500 mg via ORAL
  Filled 2018-03-17: qty 2

## 2018-03-17 MED ORDER — ACETAMINOPHEN-CODEINE #3 300-30 MG PO TABS: 1.0000 | ORAL_TABLET | Freq: Four times a day (QID) | ORAL | 0 refills | Status: DC | PRN

## 2018-03-17 NOTE — Discharge Instructions (Addendum)
Keep your scheduled appointment for breast biopsy next week. Return to the emergency department with any new or concerning symptoms.

## 2018-03-17 NOTE — ED Provider Notes (Signed)
[facility] EMERGENCY DEPARTMENT Provider Note   CSN: [id number] Arrival date & time: 03/16/18  2114     History   Chief Complaint No chief complaint on file.   HPI [name] is a 67 y.o. female.  Patient presents with 5 week history of left breast pain and redness. She has been seen by her primary care MD and sent for ultrasound which showed a mass. She is scheduled for biopsy in 5 days. She is here with request for pain management. No new symptoms. No fever, drainage from the breast. She reports history of breast CA where she received radiation and had clear lymph nodes after treatment.  The history is provided by the patient. No language interpreter was used.    Past Medical History:  Diagnosis Date  . Arthritis    "knees, lower back" (08/18/2017)  . 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 S/p lumpectomy and radiation.  Marland Kitchen CAD (coronary artery disease) 11/07   cath 11/11 :  3V CADsee report   . Cataract    small both eyes  . Chronic lower back pain   . Chronic pain    MR 08/07/10 :  Spondylosis L4-5 with B foraminal narrowing and L4 nerve root enchroachment B.  . Chronic venous insufficiency 2012   Has had full W/U incl ECHO, LFT's, Cr, Umicro, TSH, BNP. Symptomatic treatment.  . Colonic polyp 1995   Colonoscopies 1994, 2000, and 02/02/2011. Last had 9 polyps - Tubular adenoma and tubulovillous was the pathology results without high grade dysplasia  . Diabetes mellitus type II    Insulin dependent. Has worked with [name] R previously.  . Diastolic CHF, chronic ([place])    NL EF by echo 01/2012, Gr I dd  . Essential hypertension    Requires 5 drug therapy and still difficult to control.  Marland Kitchen GERD (gastroesophageal reflux disease)   . Heart murmur   . Hyperlipidemia    On daily statin  . Iron deficiency anemia    Ferritin was 12 in 2012. on FeSo4. Has had colon and EGD 02/02/11 that showed mild gastritis  and colon polyps.  . Obesity    Morbid. HAs worked with [name] T previously.  . OSA (obstructive sleep apnea) 02/16/2007   02/2007 : Moderate AHI 35.6, O2 sat decreased to 65%, CPAP titration to 16 with AHI of 3.6. 02/2014 : Rare respiratory events with sleep disturbance, within normal limits. AHI 0.4 per hour      . OSA on CPAP   . Personal history of radiation therapy    "to my left breast"  . Refusal of blood transfusions as patient is Jehovah's Witness   . Seasonal allergies    "grass pollen"    Patient Active Problem List   Diagnosis Date Noted  . Breast inflammation 02/07/2018  . Myalgia 12/22/2017  . Hypertensive retinopathy 09/09/2017  . Unstable angina ([place]) 08/18/2017  . At high risk for venous thromboembolism (VTE) 06/09/2017  . Elevated TSH 08/15/2015  . Prolonged Q-T interval on ECG 08/14/2015  . Diabetic retinopathy ([place]) 03/26/2015  . Abnormality of gait 01/06/2013  . CTS (carpal tunnel syndrome) 03/09/2012  . Urinary frequency 07/14/2011  . Diabetic neuropathy ([place]) 06/11/2011  . Chronic venous insufficiency   . Routine health maintenance 03/09/2011  . CAD (coronary artery disease)   . Benign essential HTN   . Hyperlipidemia   . Colonic polyp   . Morbid obesity ([place])   .  Chronic pain   . History of breast cancer   . Iron deficiency anemia   . Seasonal allergies   . OSA (obstructive sleep apnea) 02/16/2007  . DM neuropathy, type II diabetes mellitus ([place]) 10/04/2004    Past Surgical History:  Procedure Laterality Date  . BREAST LUMPECTOMY Left 2006  . BREAST LUMPECTOMY W/ NEEDLE LOCALIZATION  2006   34 Chemo RX  . CARDIAC CATHETERIZATION N/A 08/15/2015   Procedure: Left Heart Cath and Coronary Angiography;  Surgeon: Troy Sine, MD;  Location: [facility];  Service: Cardiovascular;  Laterality: N/A;  . COLONOSCOPY W/ BIOPSIES AND POLYPECTOMY  1994, 2000, 2012, 26/018  . LEFT HEART CATH AND CORONARY ANGIOGRAPHY N/A 08/19/2017   Procedure: LEFT  HEART CATH AND CORONARY ANGIOGRAPHY;  Surgeon: Belva Crome, MD;  Location: [facility];  Service: Cardiovascular;  Laterality: N/A;  . TONSILLECTOMY AND ADENOIDECTOMY  1964  . TOTAL ABDOMINAL HYSTERECTOMY  1985   "I had endometrosis"  . ULTRASOUND GUIDANCE FOR VASCULAR ACCESS  08/19/2017   Procedure: Ultrasound Guidance For Vascular Access;  Surgeon: Belva Crome, MD;  Location: [facility];  Service: Cardiovascular;;     OB History   None      Home Medications    Prior to Admission medications   Medication Sig Start Date End Date Taking? Authorizing Provider  amLODipine (NORVASC) 10 MG tablet TAKE ONE (1) TABLET BY MOUTH EVERY DAY 11/25/17  Yes Bartholomew Crews, MD  aspirin EC 81 MG tablet Take 81 mg by mouth daily.   Yes [provider]  cetirizine (ZYRTEC) 10 MG tablet Take 10 mg as needed by mouth for allergies.   Yes [provider]  Coenzyme Q10 (CO Q 10 PO) Take 50 mg by mouth daily.    Yes [provider]  ezetimibe (ZETIA) 10 MG tablet TAKE ONE (1) TABLET BY MOUTH EVERY DAY 03/03/18  Yes Minus Breeding, MD  isosorbide mononitrate (IMDUR) 120 MG 24 hr tablet Take 2 tablets (240 mg total) by mouth every morning. Patient taking differently: Take 240 mg by mouth daily.  08/29/17 08/29/18 Yes Lendon Colonel, NP  losartan (COZAAR) 50 MG tablet TAKE ONE (1) TABLET BY MOUTH EVERY DAY 11/25/17  Yes Bartholomew Crews, MD  Lutein 6 MG TABS Take 6 mg by mouth daily.   Yes [provider]  meloxicam (MOBIC) 7.5 MG tablet Take 1 tablet (7.5 mg total) by mouth daily. 12/22/17 12/22/18 Yes Lorella Nimrod, MD  metFORMIN (GLUCOPHAGE) 1000 MG tablet TAKE ONE (1) TABLET BY MOUTH TWO (2) TIMES DAILY WITH A MEAL 10/13/17  Yes Bartholomew Crews, MD  metoprolol succinate (TOPROL-XL) 100 MG 24 hr tablet TAKE ONE (1) TABLET BY MOUTH EVERY DAY Patient taking differently: TAKE 100 mg TABLET BY MOUTH EVERY DAY 12/27/16  Yes Bartholomew Crews, MD  nitroGLYCERIN (NITROSTAT) 0.4 MG SL tablet PLACE 1 TABLET UNDER THE TONGUE EVERY 5 MINUTES AS NEEDED FOR CHEST PAIN 11/25/17  Yes Minus Breeding, MD  Olopatadine HCl (PATADAY) 0.2 % SOLN Place 1 drop into both eyes daily as needed. For allergies   Yes [provider]  OVER THE COUNTER MEDICATION Take 1 tablet by mouth daily. SUPER VITAMIN B-COMPLEX (Vitamin B6 and B12)   Yes [provider]  repaglinide (PRANDIN) 2 MG tablet Take 2 tablets (4 mg total) by mouth daily with breakfast AND 2 tablets (4 mg total) daily with lunch AND 2 tablets (4 mg total)  daily with supper. 02/21/18  Yes Bartholomew Crews, MD  spironolactone (ALDACTONE) 25 MG tablet Take 1 tablet (25 mg total) daily by mouth. 08/20/17  Yes Lendon Colonel, NP  TOUJEO SOLOSTAR 300 UNIT/ML SOPN INJECT 35 UNITS INTO THE SKIN DAILY 11/25/17  Yes Bartholomew Crews, MD  Turmeric Curcumin 500 MG CAPS Take 500 mg by mouth daily.   Yes [provider]  ACCU-CHEK FASTCLIX LANCETS MISC Check blood sugar 3 times a day 10/11/17   Bartholomew Crews, MD  glucose blood (ACCU-CHEK SMARTVIEW) test strip Check blood sugar 3 times a day 10/11/17   Bartholomew Crews, MD  Insulin Pen Needle (UNIFINE PENTIPS) 31G X 5 MM MISC USE FOR INJECTIONS TWICE DAILY. Diagnosis code E11.40, Z79.4 12/20/17   Bartholomew Crews, MD  loratadine (CLARITIN) 10 MG tablet TAKE ONE TABLET BY MOUTH ONCE DAILY FOR ALLERGIES Patient not taking: Reported on 03/17/2018 05/08/13   Bartholomew Crews, MD  nitroGLYCERIN (NITRODUR - DOSED IN MG/24 HR) 0.4 mg/hr patch APPLY ONE PATCH ON SKIN EVERY DAY. LEAVE ON FOR 12 HOURS AND OFF FOR 12 HOURS. Patient not taking: Reported on 03/17/2018 11/05/16   Bartholomew Crews, MD    Family History Family History  Problem Relation Age of Onset  . Lung cancer Mother   . Diabetes Father   . Breast cancer Sister   . Pulmonary embolism Brother   . Depression Daughter 50  . Osteoarthritis Maternal  Grandmother   . Heart attack Brother 8  . Kidney failure Brother        died 2016-01-08 2/2 kidney and heart failure  . Colon cancer Neg Hx   . Colon polyps Neg Hx   . Esophageal cancer Neg Hx   . Rectal cancer Neg Hx   . Stomach cancer Neg Hx     Social History Social History   Tobacco Use  . Smoking status: Never Smoker  . Smokeless tobacco: Never Used  Substance Use Topics  . Alcohol use: No  . Drug use: No     Allergies   Lisinopril; Oxycodone-acetaminophen; Victoza [liraglutide]; and Atorvastatin   Review of Systems Review of Systems  Constitutional: Negative for fever.  Respiratory: Negative for cough and shortness of breath.   Cardiovascular:       C/O left breast pain  Musculoskeletal: Negative for myalgias.  Skin: Positive for color change and wound.  Neurological: Negative for weakness.     Physical Exam Updated Vital Signs BP 126/70   Pulse 63   Temp 98.5 F (36.9 C) (Oral)   Resp 18   LMP 07/12/1984   SpO2 100%   Physical Exam  Constitutional: She is oriented to person, place, and time. She appears well-developed and well-nourished.  Neck: Normal range of motion.  Pulmonary/Chest: Effort normal.  Left breast is significantly indurated over nipple and distal breast. There is dimpling of skin and multiple areas of red, raised non-fluctuant areas along the medial aspect. No nipple discharge.   Neurological: She is alert and oriented to person, place, and time.  Skin: Skin is warm and dry.     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 Patient presents with left breast redness and swelling since May this year. EXAM: DIGITAL DIAGNOSTIC LEFT MAMMOGRAM WITH CAD AND TOMO ULTRASOUND LEFT BREAST COMPARISON:  Previous exam(s). ACR Breast Density Category c: The breast tissue is heterogeneously dense, which may obscure small masses. FINDINGS: Mammographic views of the left breast demonstrate interval development of significant skin edema and breast trabecular thickening. Postsurgical changes in the left lower central breast. Slightly medial to the area of post surgical scarring, in the left breast lower inner quadrant, middle depth, there is an approximately 2 cm mostly circumscribed mass. Additionally, there is an ill-defined obscured mass in the left 12 o'clock breast, anterior depth, measuring approximately 1.5 cm. Mammographic images were processed with CAD. On physical exam, there is firmness to the entire anterior half of the left breast. Peau d'orange changes of the skin are seen in the anterior left breast, most prominent medially. Targeted ultrasound is performed, showing left breast 7 o'clock 2 cm from the nipple irregular hypoechoic mass measuring 2.2 x 2.5 x 2.5 cm. It is located approximately 1 cm medial to the area of postsurgical scarring from prior lumpectomy. Additionally, there is a left breast 12 o'clock 2 cm from the nipple angulated hypoechoic mass extending to the skin measuring 3.9 x 2.9 x 3.6 cm. There is marked skin thickening, most prominent at 9 o'clock 5 cm from the nipple where the skin measures 8 mm in thickness and is hypervascular. Sonographic evaluation of the left axilla demonstrates no evidence of lymphadenopathy. 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 Discrete mass at 7 o'clock measures  2.5 cm in greatest dimension. Discrete mass at 12 o'clock measures 3.9 cm in greatest dimension. Marked skin thickening and hypervascularity in the medial left breast. RECOMMENDATION: Ultrasound-guided core needle biopsy of left breast 7 o'clock and 12 o'clock masses. Skin punch biopsy of the medial left breast may also be considered, if found important for the clinical decision making. I have discussed the findings and recommendations with the patient. Results were also provided in writing at the conclusion of the visit. If applicable, a reminder letter will be sent to the patient regarding the next appointment. BI-RADS CATEGORY  5: Highly suggestive of malignancy. Electronically Signed   By: Fidela Salisbury M.D.   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 Patient presents with left breast redness and swelling since May this year. EXAM: DIGITAL DIAGNOSTIC LEFT MAMMOGRAM WITH CAD AND TOMO ULTRASOUND LEFT BREAST COMPARISON:  Previous exam(s). ACR Breast Density Category c: The breast tissue is heterogeneously dense, which may obscure small masses. FINDINGS: Mammographic views of the left breast demonstrate interval development of significant skin edema and breast trabecular thickening. Postsurgical changes in the left lower central breast. Slightly medial to the area of post surgical scarring, in the left breast lower inner quadrant, middle depth, there is an approximately 2 cm mostly circumscribed mass. Additionally, there is an ill-defined obscured mass in the left 12 o'clock breast, anterior depth, measuring approximately 1.5 cm. Mammographic images were processed with CAD. On physical  exam, there is firmness to the entire anterior half of the left breast. Peau d'orange changes of the skin are seen in the anterior left breast, most  prominent medially. Targeted ultrasound is performed, showing left breast 7 o'clock 2 cm from the nipple irregular hypoechoic mass measuring 2.2 x 2.5 x 2.5 cm. It is located approximately 1 cm medial to the area of postsurgical scarring from prior lumpectomy. Additionally, there is a left breast 12 o'clock 2 cm from the nipple angulated hypoechoic mass extending to the skin measuring 3.9 x 2.9 x 3.6 cm. There is marked skin thickening, most prominent at 9 o'clock 5 cm from the nipple where the skin measures 8 mm in thickness and is hypervascular. Sonographic evaluation of the left axilla demonstrates no evidence of lymphadenopathy. 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 Discrete mass at 7 o'clock measures 2.5 cm in greatest dimension. Discrete mass at 12 o'clock measures 3.9 cm in greatest dimension. Marked skin thickening and hypervascularity in the medial left breast. RECOMMENDATION: Ultrasound-guided core needle biopsy of left breast 7 o'clock and 12 o'clock masses. Skin punch biopsy of the medial left breast may also be considered, if found important for the clinical decision making. I have discussed the findings and recommendations with the patient. Results were also provided in writing at the conclusion of the visit. If applicable, a reminder letter will be sent to the patient regarding the next appointment. BI-RADS CATEGORY  5: Highly suggestive of malignancy. Electronically Signed   By: Fidela Salisbury M.D.   On: 03/15/2018 12:42    Procedures Procedures (including critical care time)  Medications Ordered in ED Medications  cephALEXin (KEFLEX) capsule 500 mg (has no administration in time range)  acetaminophen-codeine (TYLENOL #3) 300-30 MG per tablet 2 tablet (has no administration in time range)     Initial Impression / Assessment and Plan / ED Course  I have reviewed the triage vital signs and the nursing notes.  Pertinent labs & imaging  results that were available during my care of the patient were reviewed by me and considered in my medical decision making (see chart for details).     The patient presents with known breast mass suspicious for recurrent breast CA, pending biopsies next week, for pain control and a strong request for antibiotics.   Discussed at length that antibiotics are not going to be what makes this better. She states she is aware of the diagnosis and will do what she needs to do to have it evaluated and treated but she feels strongly that wants an antibiotic for possible secondary infection based on the redness and lesion of the skin of the breast. I will give her Keflex to appease her and Tylenol with Codeine for pain. She intends to keep the appointment for breast mass biopsy next week.   Final Clinical Impressions(s) / ED Diagnoses   Final diagnoses:  None   1. Breast mass 2. Pain, left breast  ED Discharge Orders    None       Dennie Bible 29/56/21 [id number]    Delora Fuel, MD 57/84/69 [phone]

## 2018-03-21 ENCOUNTER — Ambulatory Visit
Admission: RE | Admit: 2018-03-21 | Discharge: 2018-03-21 | Disposition: A | Discharge status: Home / Self Care | Payer: Medicare Other | Source: Ambulatory Visit | Attending: Oncology | Admitting: Oncology

## 2018-03-21 ENCOUNTER — Other Ambulatory Visit: Payer: Self-pay | Admitting: Internal Medicine

## 2018-03-21 DIAGNOSIS — N6320 Unspecified lump in the left breast, unspecified quadrant: Secondary | ICD-10-CM

## 2018-03-21 DIAGNOSIS — N6322 Unspecified lump in the left breast, upper inner quadrant: Secondary | ICD-10-CM | POA: Diagnosis not present

## 2018-03-21 DIAGNOSIS — N6321 Unspecified lump in the left breast, upper outer quadrant: Secondary | ICD-10-CM | POA: Diagnosis not present

## 2018-03-21 DIAGNOSIS — C50812 Malignant neoplasm of overlapping sites of left female breast: Secondary | ICD-10-CM | POA: Diagnosis not present

## 2018-03-21 DIAGNOSIS — N6324 Unspecified lump in the left breast, lower inner quadrant: Secondary | ICD-10-CM | POA: Diagnosis not present

## 2018-03-21 DIAGNOSIS — C50312 Malignant neoplasm of lower-inner quadrant of left female breast: Secondary | ICD-10-CM | POA: Diagnosis not present

## 2018-03-23 ENCOUNTER — Telehealth: Payer: Self-pay | Admitting: Internal Medicine

## 2018-03-23 ENCOUNTER — Ambulatory Visit: Payer: Medicare Other | Admitting: Dietician

## 2018-03-23 NOTE — Telephone Encounter (Signed)
PATIENT CALLED SHE IS MEETING WITH HER SURGEON 03/30/18 AT 8:30 AND THEN COMING TO KEEP APPT WITH DR. Lynnae January AT 9:15 ON 03/30/18

## 2018-03-25 ENCOUNTER — Other Ambulatory Visit: Payer: Self-pay | Admitting: Internal Medicine

## 2018-03-25 ENCOUNTER — Other Ambulatory Visit: Payer: Self-pay | Admitting: Adult Health

## 2018-03-30 ENCOUNTER — Encounter: Payer: Self-pay | Admitting: Internal Medicine

## 2018-03-30 ENCOUNTER — Other Ambulatory Visit: Payer: Self-pay | Admitting: Adult Health

## 2018-03-30 ENCOUNTER — Other Ambulatory Visit: Payer: Self-pay

## 2018-03-30 ENCOUNTER — Ambulatory Visit (INDEPENDENT_AMBULATORY_CARE_PROVIDER_SITE_OTHER): Payer: Medicare Other | Admitting: Internal Medicine

## 2018-03-30 VITALS — BP 126/64 | HR 66 | Temp 98.8°F | Ht 61.0 in | Wt 210.0 lb

## 2018-03-30 DIAGNOSIS — R7989 Other specified abnormal findings of blood chemistry: Secondary | ICD-10-CM

## 2018-03-30 DIAGNOSIS — Z170 Estrogen receptor positive status [ER+]: Secondary | ICD-10-CM | POA: Diagnosis not present

## 2018-03-30 DIAGNOSIS — I10 Essential (primary) hypertension: Secondary | ICD-10-CM

## 2018-03-30 DIAGNOSIS — Z79899 Other long term (current) drug therapy: Secondary | ICD-10-CM

## 2018-03-30 DIAGNOSIS — Z794 Long term (current) use of insulin: Secondary | ICD-10-CM

## 2018-03-30 DIAGNOSIS — I2510 Atherosclerotic heart disease of native coronary artery without angina pectoris: Secondary | ICD-10-CM

## 2018-03-30 DIAGNOSIS — Z7982 Long term (current) use of aspirin: Secondary | ICD-10-CM | POA: Diagnosis not present

## 2018-03-30 DIAGNOSIS — C50912 Malignant neoplasm of unspecified site of left female breast: Secondary | ICD-10-CM | POA: Diagnosis present

## 2018-03-30 DIAGNOSIS — J302 Other seasonal allergic rhinitis: Secondary | ICD-10-CM | POA: Diagnosis not present

## 2018-03-30 DIAGNOSIS — I25118 Atherosclerotic heart disease of native coronary artery with other forms of angina pectoris: Secondary | ICD-10-CM

## 2018-03-30 DIAGNOSIS — C50112 Malignant neoplasm of central portion of left female breast: Secondary | ICD-10-CM | POA: Diagnosis not present

## 2018-03-30 DIAGNOSIS — E1140 Type 2 diabetes mellitus with diabetic neuropathy, unspecified: Secondary | ICD-10-CM

## 2018-03-30 DIAGNOSIS — R070 Pain in throat: Secondary | ICD-10-CM | POA: Diagnosis not present

## 2018-03-30 LAB — POCT GLYCOSYLATED HEMOGLOBIN (HGB A1C): Hemoglobin A1C: 8.3 % — AB (ref 4.0–5.6)

## 2018-03-30 LAB — GLUCOSE, CAPILLARY: GLUCOSE-CAPILLARY: 166 mg/dL — AB (ref 70–99)

## 2018-03-30 MED ORDER — SPIRONOLACTONE 25 MG PO TABS: 25.0000 mg | ORAL_TABLET | Freq: Every day | ORAL | 3 refills | Status: DC

## 2018-03-30 MED ORDER — METOPROLOL SUCCINATE ER 100 MG PO TB24: ORAL_TABLET | ORAL | 3 refills | Status: DC

## 2018-03-30 MED ORDER — HYDROCOD POLST-CPM POLST ER 10-8 MG/5ML PO SUER: 5.0000 mL | Freq: Two times a day (BID) | ORAL | 0 refills | Status: DC

## 2018-03-30 MED ORDER — INSULIN GLARGINE 300 UNIT/ML ~~LOC~~ SOPN: 35.0000 [IU] | PEN_INJECTOR | Freq: Every day | SUBCUTANEOUS | 3 refills | Status: DC

## 2018-03-30 NOTE — Progress Notes (Signed)
   Subjective:    Patient ID: [name], female    DOB: [birth year], 67 y.o.   MRN: [id number]  HPI  [name] is here for breast bx results. Please see the A&P for the status of the pt's chronic medical problems.  ROS : per ROS section and in problem oriented charting. All other systems are negative.  PMHx, Soc hx, and / or Fam hx : She relies on her daughter, who was with her today, for transportation to and from appointments.  She will also rely on her daughter postoperatively.  Review of Systems  Constitutional: Negative for fever.  HENT: Positive for congestion, postnasal drip and sneezing. Negative for rhinorrhea.        Ear itching Had sore throat  Respiratory: Positive for cough and wheezing. Negative for shortness of breath.   Gastrointestinal: Negative for diarrhea and nausea.  Skin: Positive for rash and wound.       Skin changes on the left breast       Objective:   Physical Exam  Constitutional: She appears well-developed and well-nourished. No distress.  HENT:  Head: Normocephalic and atraumatic.  Right Ear: External ear normal.  Left Ear: External ear normal.  Nose: Nose normal.  Mouth/Throat: Oropharynx is clear and moist. No oropharyngeal exudate.  Very mild posterior bilateral erythema  Eyes: Conjunctivae and EOM are normal. Right eye exhibits no discharge. Left eye exhibits no discharge. No scleral icterus.  Cardiovascular: Normal rate, regular rhythm and normal heart sounds.  No murmur heard. Pulmonary/Chest: Effort normal and breath sounds normal. She has no wheezes.  Neurological: She is alert.  Skin: Skin is warm and dry. No rash noted. She is not diaphoretic. No erythema. No pallor.  Left breast not examined today as she just returned from the surgeon's office  Psychiatric: She has a normal mood and affect. Her behavior is normal. Judgment and thought content normal.      Assessment & Plan:

## 2018-03-30 NOTE — Assessment & Plan Note (Signed)
This problem is chronic and stable.  She has graduated from cardiac rehab.  She will need to see cardiology for surgical clearance prior to any breast cancer surgery.  She is on aspirin, beta-blocker, and statin.  PLAN:  Cont current meds

## 2018-03-30 NOTE — Assessment & Plan Note (Signed)
This problem is chronic and worsened.  Her allergies have been acting up for the past week.  It all started about 2 weeks ago when her cough started.  1 week ago, in addition to the cough, she had wheezing, congestion, postnasal drip, sneezing, ear itching, chest congestion.  These symptoms are all worse when she is outside.  She is alternating between Zyrtec, Claritin, and Benadryl.  She also uses a sinus irrigation system.  She has tried a nasal corticosteroid in the past and did not feel that it helped.  She does request a prescription opioid cough medicine which I am happy to prescribe that I informed her, it may not be covered by her Medicare or Medicaid.  Tussionex  PLAN:  Cont current meds Tussionex 5 mL p.o. twice daily as needed

## 2018-03-30 NOTE — Assessment & Plan Note (Signed)
This problem is chronic and stable.  She is on spironolactone 25, losartan 50 mg, metoprolol 100, amlodipine 10, and imdur 240 once a day.  She has no side effects to these medications.  Her blood pressure is at goal on repeat check today.  PLAN:  Cont current meds   BP Readings from Last 3 Encounters:  03/30/18 126/64  03/17/18 125/61  03/03/18 (!) 143/55

## 2018-03-30 NOTE — Assessment & Plan Note (Signed)
This problem is new.  Her mammogram was abnormal which led to a biopsy which showed grade 3 invasive ductal carcinoma.  She has been given this result verbally by the breast cancer center and I also gave her a printout of her pathology reports today.  She just got back from seeing the surgeon, Dr. Marlou Starks, whom she very much likes and respects.  They discussed surgical options and she seems committed to having surgery.  The surgeon wanted her to speak to oncology before hand and she is getting that appointment scheduled.  She will also need to be seen by Dr. Percival Spanish, cardiology, for surgical clearance as she has known CAD and had recently been recommended to have a CABG but did not proceed as the surgeons were not willing to intervene surgically if she would not accept a blood transfusion if the need arose.  She states Dr. Marlou Starks has no concern about surgery and her refusal of blood products.  She is upbeat about her prognosis and accepts her diagnosis.  PLAN : Follow surgery, onc's, and card's notes

## 2018-03-30 NOTE — Patient Instructions (Signed)
1. I will mail you your TSH results 2. I will resend your refills 3. I will send in Tussionex for cough - may not be covered 4. Pls consider another continuous glucose monitor.

## 2018-03-30 NOTE — Assessment & Plan Note (Addendum)
This problem is chronic and uncontrolled.  Her A1c trend is 8.8 -7.9 -8.3 today.  She is on metformin at thousand twice daily, Toujeo 35 units, and Prandin 2 mg of breakfast, 4 mg with lunch, and 4 mg with dinner.  She has her meter with her today and is checking once or twice a day.  She had no hypoglycemia reported but states that she had a reading of 60 and felt fine but this is not showing up on her download today.  92% of her sugars are within range and 8% outside of range.  Therefore, I am wondering if she is having postprandial hyperglycemia that is not being caught on her random finger glucose sticks.  She has used continuous glucose monitoring in the past and really liked it because she felt it got her to goal.  She stated in January or February of this year so it likely will be covered now.  She will return to see [name] to get a continuous glucose monitor placed and speak about getting a personal monitor.  PLAN:  Cont current meds CMG with [name] Decrease the amount of fruit she is using in her juicing

## 2018-03-31 ENCOUNTER — Encounter: Payer: Self-pay | Admitting: Internal Medicine

## 2018-03-31 LAB — TSH: TSH: 2.72 u[IU]/mL (ref 0.450–4.500)

## 2018-04-03 ENCOUNTER — Telehealth: Payer: Self-pay | Admitting: *Deleted

## 2018-04-03 NOTE — Telephone Encounter (Signed)
Called pt to discuss med onc appt. Left vm requesting return call with contact information.

## 2018-04-05 ENCOUNTER — Encounter: Payer: Medicare Other | Admitting: Dietician

## 2018-04-07 ENCOUNTER — Telehealth: Payer: Self-pay | Admitting: *Deleted

## 2018-04-07 ENCOUNTER — Telehealth: Payer: Self-pay | Admitting: Cardiology

## 2018-04-07 NOTE — Telephone Encounter (Signed)
    [facility] Pre-operative Risk Assessment    Request for surgical clearance:  1. What type of surgery is being performed? MASTECTOMY   2. When is this surgery scheduled? URGENTLY NEEDED   3. What type of clearance is required (medical clearance vs. Pharmacy clearance to hold med vs. Both)? MEDICAL  4. Are there any medications that need to be held prior to surgery and how long? NONE  5. Practice name and name of physician performing surgery? [facility]   6. What is your office phone number [phone]    7.   What is your office fax number [phone]  8.   Anesthesia type (None, local, MAC, general) ? GENERAL   [name] 04/07/2018, 10:45 AM  _________________________________________________________________   (provider comments below)

## 2018-04-07 NOTE — Telephone Encounter (Signed)
Late entry- 04/05/18 Pt left vm stating will be out of town until Monday. Called pt back to schedule appt. No answer. Left detailed msg requesting return call to schedule with med onc. Contact information provided.

## 2018-04-07 NOTE — Telephone Encounter (Signed)
New Message:    Pt was calling to find out if Dr Percival Spanish had given her surgeon the okay to have her surgery for removal of her breast?

## 2018-04-07 NOTE — Telephone Encounter (Signed)
   Primary Cardiologist: Dr Percival Spanish  Chart reviewed and patient interviewed over the phone as part of pre-operative protocol coverage. Given past medical history, my interview, and time since last visit, based on ACC/AHA guidelines, ORETHA WEISMANN would be at acceptable risk for the planned procedure without further cardiovascular testing.   I will route this recommendation to the requesting party via Epic fax function and remove from pre-op pool.  Please call with questions.  Kerin Ransom, PA-C 04/07/2018, 10:59 AM

## 2018-04-10 ENCOUNTER — Telehealth: Payer: Self-pay | Admitting: Hematology and Oncology

## 2018-04-10 NOTE — Telephone Encounter (Signed)
Pt has been scheduled to see Dr. Lindi Adie on 7/9 at 345pm. Pt aware to arrive 30 minutes early.

## 2018-04-11 ENCOUNTER — Inpatient Hospital Stay: Payer: Medicare Other | Attending: Hematology and Oncology | Admitting: Hematology and Oncology

## 2018-04-11 ENCOUNTER — Other Ambulatory Visit: Payer: Self-pay

## 2018-04-11 ENCOUNTER — Telehealth: Payer: Self-pay | Admitting: Dietician

## 2018-04-11 VITALS — BP 134/55 | HR 91 | Temp 98.7°F | Resp 18 | Ht 61.0 in | Wt 215.2 lb

## 2018-04-11 DIAGNOSIS — R51 Headache: Secondary | ICD-10-CM | POA: Diagnosis not present

## 2018-04-11 DIAGNOSIS — E119 Type 2 diabetes mellitus without complications: Secondary | ICD-10-CM | POA: Diagnosis not present

## 2018-04-11 DIAGNOSIS — C50812 Malignant neoplasm of overlapping sites of left female breast: Secondary | ICD-10-CM

## 2018-04-11 DIAGNOSIS — Z5189 Encounter for other specified aftercare: Secondary | ICD-10-CM | POA: Diagnosis not present

## 2018-04-11 DIAGNOSIS — Z5111 Encounter for antineoplastic chemotherapy: Secondary | ICD-10-CM | POA: Diagnosis not present

## 2018-04-11 DIAGNOSIS — Z170 Estrogen receptor positive status [ER+]: Secondary | ICD-10-CM | POA: Diagnosis not present

## 2018-04-11 DIAGNOSIS — Z452 Encounter for adjustment and management of vascular access device: Secondary | ICD-10-CM | POA: Diagnosis not present

## 2018-04-11 DIAGNOSIS — C50312 Malignant neoplasm of lower-inner quadrant of left female breast: Secondary | ICD-10-CM

## 2018-04-11 MED ORDER — ONDANSETRON HCL 8 MG PO TABS: 8.0000 mg | ORAL_TABLET | Freq: Two times a day (BID) | ORAL | 1 refills | Status: DC | PRN

## 2018-04-11 MED ORDER — DEXAMETHASONE 4 MG PO TABS: 4.0000 mg | ORAL_TABLET | Freq: Every day | ORAL | 0 refills | Status: DC

## 2018-04-11 MED ORDER — LIDOCAINE-PRILOCAINE 2.5-2.5 % EX CREA: TOPICAL_CREAM | CUTANEOUS | 3 refills | Status: DC

## 2018-04-11 MED ORDER — PROCHLORPERAZINE MALEATE 10 MG PO TABS: 10.0000 mg | ORAL_TABLET | Freq: Four times a day (QID) | ORAL | 1 refills | Status: DC | PRN

## 2018-04-11 NOTE — Telephone Encounter (Signed)
Called patient to discuss Continuous glucose monitoring.

## 2018-04-11 NOTE — Assessment & Plan Note (Signed)
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 S/p lumpectomy and radiation.  03/21/2018:Peau d' Orange skin anterior left breast: Ultrasound 7 o'clock position 2 cm from nipple irregular hypoechoic mass 2.2 x 2.5 x 2.5 cm (1 cm medial to prior lumpectomy); 12:00: Hypoechoic mass extending to skin 3.9 x 2.9 x 3.6 cm market skin thickening, no axillary lymph nodes  Left breast 12 o'clock position: IDC grade 3; 7 o'clock position: IDC grade 3, ER 40%, PR 0%, Ki-67 80%, HER-2 equivocal by FISH ratio 1.17 copy #4.4, IHC 1+ negative, T4d N0 stage IIIc clinical stage ----------------------------------------------------------------------------- Pathology and radiology counseling: Discussed with the patient, the details of pathology including the type of breast cancer,the clinical staging, the significance of ER, PR and HER-2/neu receptors and the implications for treatment. After reviewing the pathology in detail, we proceeded to discuss the different treatment options between surgery, radiation, chemotherapy, antiestrogen therapies.  Recommendation: 1.  Neoadjuvant chemotherapy with dose dense Adriamycin and Cytoxan x4 followed by Taxol weekly x12 2. followed by mastectomy 3.  Followed by radiation 4.  Followed by antiestrogen therapy  Plan: 1. Port placement 2. Echocardiogram 3. Chemotherapy class 4.  Staging studies 5.  Upbeat clinical trial  6. Start chemotherapy in 2 weeks

## 2018-04-11 NOTE — Progress Notes (Signed)
[facility] NOTE  Patient Care Team: Bartholomew Crews, MD as PCP - General (Internal Medicine) Hayden Pedro, MD as Consulting Physician (Ophthalmology) Shirley Muscat Loreen Freud, MD as Referring Physician (Optometry) Minus Breeding, MD as Consulting Physician (Cardiology)  CHIEF COMPLAINTS/PURPOSE OF CONSULTATION:  Newly diagnosed breast cancer  HISTORY OF PRESENTING ILLNESS:  [name] 67 y.o. female is here because of recent diagnosis of left breast inflammatory breast cancer.  Patient had a prior history of left breast DCIS high-grade that was treated with lumpectomy and radiation in 2006.  Over the last couple of months she noticed a red spot on the breast that gradually progressed with peau d'orange appearance to involve the entire breast.  She subsequently underwent mammograms and ultrasounds and biopsies.  It came back as invasive ductal carcinoma high-grade with a profound inflammatory breast cancer changes.  The tumor was ER positive PR negative and HER-2 negative.  She was seen by Dr. Marlou Starks who referred the patient to me to discuss neoadjuvant treatment options. She is a Restaurant manager, fast food.  I reviewed her records extensively and collaborated the history with the patient.  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 S/p lumpectomy and radiation.      03/21/2018 Initial Diagnosis    Peau d' Orange skin anterior left breast: Ultrasound 7 o'clock position 2 cm from nipple irregular hypoechoic mass 2.2 x 2.5 x 2.5 cm (1 cm medial to prior lumpectomy); 12:00: Hypoechoic mass extending to skin 3.9 x 2.9 x 3.6 cm market skin thickening, no axillary lymph nodes      03/21/2018 Initial Biopsy    Left breast 12 o'clock position: IDC grade 3; 7 o'clock position: IDC grade 3, ER 40%, PR 0%,  Ki-67 80%, HER-2 equivocal by FISH ratio 1.17 copy #4.4, IHC 1+ negative, T4d N0 stage IIIc clinical stage       MEDICAL HISTORY:  Past Medical History:  Diagnosis Date  . Arthritis    "knees, lower back" (08/18/2017)  . 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 S/p lumpectomy and radiation.  Marland Kitchen CAD (coronary artery disease) 11/07   cath 11/11 :  3V CADsee report   . Cataract    small both eyes  . Chronic lower back pain   . Chronic pain    MR 08/07/10 :  Spondylosis L4-5 with B foraminal narrowing and L4 nerve root enchroachment B.  . Chronic venous insufficiency 2012   Has had full W/U incl ECHO, LFT's, Cr, Umicro, TSH, BNP. Symptomatic treatment.  . Colonic polyp 1995   Colonoscopies 1994, 2000, and 02/02/2011. Last had 9 polyps - Tubular adenoma and tubulovillous was the pathology results without high grade dysplasia  . Diabetes mellitus type II    Insulin dependent. Has worked with [name] R previously.  . Diastolic CHF, chronic ([place])    NL EF by echo 01/2012, Gr I dd  . Essential hypertension    Requires 5 drug therapy and still difficult to control.  Marland Kitchen GERD (gastroesophageal reflux disease)   . Heart murmur   . Hyperlipidemia    On daily statin  . Iron deficiency anemia    Ferritin was 12 in 2012. on FeSo4. Has had colon and EGD 02/02/11 that showed mild gastritis and colon polyps.  Marland Kitchen  Obesity    Morbid. HAs worked with [name] T previously.  . OSA (obstructive sleep apnea) 02/16/2007   02/2007 : Moderate AHI 35.6, O2 sat decreased to 65%, CPAP titration to 16 with AHI of 3.6. 02/2014 : Rare respiratory events with sleep disturbance, within normal limits. AHI 0.4 per hour      . OSA on CPAP   . Personal history of radiation therapy    "to my left breast"  . Refusal of blood transfusions as patient is Jehovah's Witness   . Seasonal allergies    "grass pollen"    SURGICAL HISTORY: Past Surgical History:  Procedure Laterality  Date  . BREAST LUMPECTOMY Left 2006  . BREAST LUMPECTOMY W/ NEEDLE LOCALIZATION  2006   34 Chemo RX  . CARDIAC CATHETERIZATION N/A 08/15/2015   Procedure: Left Heart Cath and Coronary Angiography;  Surgeon: Troy Sine, MD;  Location: [facility];  Service: Cardiovascular;  Laterality: N/A;  . COLONOSCOPY W/ BIOPSIES AND POLYPECTOMY  1994, 2000, 2012, 26/018  . LEFT HEART CATH AND CORONARY ANGIOGRAPHY N/A 08/19/2017   Procedure: LEFT HEART CATH AND CORONARY ANGIOGRAPHY;  Surgeon: Belva Crome, MD;  Location: [facility];  Service: Cardiovascular;  Laterality: N/A;  . TONSILLECTOMY AND ADENOIDECTOMY  1964  . TOTAL ABDOMINAL HYSTERECTOMY  1985   "I had endometrosis"  . ULTRASOUND GUIDANCE FOR VASCULAR ACCESS  08/19/2017   Procedure: Ultrasound Guidance For Vascular Access;  Surgeon: Belva Crome, MD;  Location: [facility];  Service: Cardiovascular;;    SOCIAL HISTORY: Social History   Socioeconomic History  . Marital status: Single    Spouse name: Not on file  . Number of children: Not on file  . Years of education: 28  . Highest education level: Not on file  Occupational History  . Not on file  Social Needs  . Financial resource strain: Not on file  . Food insecurity:    Worry: Not on file    Inability: Not on file  . Transportation needs:    Medical: Not on file    Non-medical: Not on file  Tobacco Use  . Smoking status: Never Smoker  . Smokeless tobacco: Never Used  Substance and Sexual Activity  . Alcohol use: No  . Drug use: No  . Sexual activity: Never  Lifestyle  . Physical activity:    Days per week: Not on file    Minutes per session: Not on file  . Stress: Not at all  Relationships  . Social connections:    Talks on phone: Not on file    Gets together: Not on file    Attends religious service: Not on file    Active member of club or organization: Not on file    Attends meetings of clubs or organizations: Not on file     Relationship status: Not on file  . Intimate partner violence:    Fear of current or ex partner: Not on file    Emotionally abused: Not on file    Physically abused: Not on file    Forced sexual activity: Not on file  Other Topics Concern  . Not on file  Social History Narrative   Worked at [employer] part-time from [phone], 2009-2011. No longer working 2015.Associates degree in early childhood education. Single, lives by self.    FAMILY HISTORY: Family History  Problem Relation Age of Onset  . Lung cancer Mother   . Diabetes Father   . Breast  cancer Sister   . Pulmonary embolism Brother   . Depression Daughter 44  . Osteoarthritis Maternal Grandmother   . Heart attack Brother 30  . Kidney failure Brother        died 12/01/2015 2/2 kidney and heart failure  . Colon cancer Neg Hx   . Colon polyps Neg Hx   . Esophageal cancer Neg Hx   . Rectal cancer Neg Hx   . Stomach cancer Neg Hx     ALLERGIES:  is allergic to lisinopril; oxycodone-acetaminophen; victoza [liraglutide]; and atorvastatin.  MEDICATIONS:  Current Outpatient Medications  Medication Sig Dispense Refill  . ACCU-CHEK FASTCLIX LANCETS MISC Check blood sugar 3 times a day 102 each 5  . amLODipine (NORVASC) 10 MG tablet TAKE ONE (1) TABLET BY MOUTH EVERY DAY 90 tablet 3  . aspirin EC 81 MG tablet Take 81 mg by mouth daily.    . cetirizine (ZYRTEC) 10 MG tablet Take 10 mg as needed by mouth for allergies.    . chlorpheniramine-HYDROcodone (TUSSIONEX PENNKINETIC ER) 10-8 MG/5ML SUER Take 5 mLs by mouth 2 (two) times daily. 140 mL 0  . Coenzyme Q10 (CO Q 10 PO) Take 50 mg by mouth daily.     Marland Kitchen ezetimibe (ZETIA) 10 MG tablet TAKE ONE (1) TABLET BY MOUTH EVERY DAY 30 tablet 7  . glucose blood (ACCU-CHEK SMARTVIEW) test strip Check blood sugar 3 times a day 100 each 5  . Insulin Glargine (TOUJEO SOLOSTAR) 300 UNIT/ML SOPN Inject 35 Units into the skin daily. 12 mL 3  . Insulin Pen Needle (UNIFINE PENTIPS) 31G X 5 MM  MISC USE FOR INJECTIONS TWICE DAILY. Diagnosis code E11.40, Z79.4 100 each 11  . isosorbide mononitrate (IMDUR) 120 MG 24 hr tablet Take 2 tablets (240 mg total) by mouth every morning. (Patient taking differently: Take 240 mg by mouth daily. ) 30 tablet 11  . isosorbide mononitrate (IMDUR) 120 MG 24 hr tablet TAKE TWO (2) TABLETS BY MOUTH EVERY MORNING 60 tablet 6  . loratadine (CLARITIN) 10 MG tablet TAKE ONE TABLET BY MOUTH ONCE DAILY FOR ALLERGIES (Patient not taking: Reported on 03/17/2018) 90 tablet 3  . losartan (COZAAR) 50 MG tablet TAKE ONE (1) TABLET BY MOUTH EVERY DAY 90 tablet 3  . Lutein 6 MG TABS Take 6 mg by mouth daily.    . meloxicam (MOBIC) 7.5 MG tablet Take 1 tablet (7.5 mg total) by mouth daily. 30 tablet 2  . metFORMIN (GLUCOPHAGE) 1000 MG tablet TAKE ONE (1) TABLET BY MOUTH TWO (2) TIMES DAILY WITH A MEAL 180 tablet 3  . metoprolol succinate (TOPROL-XL) 100 MG 24 hr tablet TAKE ONE (1) TABLET BY MOUTH EVERY DAY 90 tablet 3  . nitroGLYCERIN (NITRODUR - DOSED IN MG/24 HR) 0.4 mg/hr patch APPLY ONE PATCH ON SKIN EVERY DAY. LEAVE ON FOR 12 HOURS AND OFF FOR 12 HOURS. (Patient not taking: Reported on 03/17/2018) 100 patch 3  . nitroGLYCERIN (NITROSTAT) 0.4 MG SL tablet PLACE 1 TABLET UNDER THE TONGUE EVERY 5 MINUTES AS NEEDED FOR CHEST PAIN 25 tablet 7  . Olopatadine HCl (PATADAY) 0.2 % SOLN Place 1 drop into both eyes daily as needed. For allergies    . OVER THE COUNTER MEDICATION Take 1 tablet by mouth daily. SUPER VITAMIN B-COMPLEX (Vitamin B6 and B12)    . repaglinide (PRANDIN) 2 MG tablet Take 2 tablets (4 mg total) by mouth daily with breakfast AND 2 tablets (4 mg total) daily with lunch AND 2 tablets (4  mg total) daily with supper. 270 tablet 3  . spironolactone (ALDACTONE) 25 MG tablet Take 1 tablet (25 mg total) by mouth daily. 90 tablet 3  . Turmeric Curcumin 500 MG CAPS Take 500 mg by mouth daily.     No current facility-administered medications for this visit.      REVIEW OF SYSTEMS:   Constitutional: Denies fevers, chills or abnormal night sweats Eyes: Denies blurriness of vision, double vision or watery eyes Ears, nose, mouth, throat, and face: Denies mucositis or sore throat Respiratory: Denies cough, dyspnea or wheezes Cardiovascular: Denies palpitation, chest discomfort or lower extremity swelling Gastrointestinal:  Denies nausea, heartburn or change in bowel habits Skin: Denies abnormal skin rashes Lymphatics: Denies new lymphadenopathy or easy bruising Neurological:Denies numbness, tingling or new weaknesses Behavioral/Psych: Mood is stable, no new changes  Breast: Marketed skin thickening swelling redness involving the left breast in all 3 quadrants All other systems were reviewed with the patient and are negative.  PHYSICAL EXAMINATION: ECOG PERFORMANCE STATUS: 1 - Symptomatic but completely ambulatory  Vitals:   04/11/18 1549  BP: (!) 134/55  Pulse: 91  Resp: 18  Temp: 98.7 F (37.1 C)  SpO2: 100%   Filed Weights   04/11/18 1549  Weight: 215 lb 3.2 oz (97.6 kg)    GENERAL:alert, no distress and comfortable SKIN: skin color, texture, turgor are normal, no rashes or significant lesions EYES: normal, conjunctiva are pink and non-injected, sclera clear OROPHARYNX:no exudate, no erythema and lips, buccal mucosa, and tongue normal  NECK: supple, thyroid normal size, non-tender, without nodularity LYMPH:  no palpable lymphadenopathy in the cervical, axillary or inguinal LUNGS: clear to auscultation and percussion with normal breathing effort HEART: regular rate & rhythm and no murmurs and no lower extremity edema ABDOMEN:abdomen soft, non-tender and normal bowel sounds Musculoskeletal:no cyanosis of digits and no clubbing  PSYCH: alert & oriented x 3 with fluent speech NEURO: no focal motor/sensory deficits BREAST: Peau d'orange appearance of the left breast with palpable nodularity no axillary lymphadenopathy. No palpable  axillary or supraclavicular lymphadenopathy (exam performed in the presence of a chaperone)   LABORATORY DATA:  I have reviewed the data as listed Lab Results  Component Value Date   WBC 7.5 03/16/2018   HGB 11.7 (L) 03/16/2018   HCT 37.0 03/16/2018   MCV 90.9 03/16/2018   PLT 232 03/16/2018   Lab Results  Component Value Date   NA 142 03/16/2018   K 3.8 03/16/2018   CL 105 03/16/2018   CO2 26 03/16/2018    RADIOGRAPHIC STUDIES: I have personally reviewed the radiological reports and agreed with the findings in the report.  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 S/p lumpectomy and radiation.  03/21/2018:Peau d' Orange skin anterior left breast: Ultrasound 7 o'clock position 2 cm from nipple irregular hypoechoic mass 2.2 x 2.5 x 2.5 cm (1 cm medial to prior lumpectomy); 12:00: Hypoechoic mass extending to skin 3.9 x 2.9 x 3.6 cm market skin thickening, no axillary lymph nodes  Left breast 12 o'clock position: IDC grade 3; 7 o'clock position: IDC grade 3, ER 40%, PR 0%, Ki-67 80%, HER-2 equivocal by FISH ratio 1.17 copy #4.4, IHC 1+ negative, T4d N0 stage IIIc clinical stage ----------------------------------------------------------------------------- Pathology and radiology counseling: Discussed with the patient, the details of pathology including the type of breast cancer,the clinical staging, the significance of  ER, PR and HER-2/neu receptors and the implications for treatment. After reviewing the pathology in detail, we proceeded to discuss the different treatment options between surgery, radiation, chemotherapy, antiestrogen therapies.  Recommendation: 1.  Neoadjuvant chemotherapy with dose dense Taxotere and Cytoxan x4  2. followed by mastectomy 3.  Followed by radiation 4.  Followed by antiestrogen  therapy  Plan: 1. Port placement 2. Echocardiogram 3. Chemotherapy class 4.  Staging studies 5.Start chemotherapy in 1 week  Patient has very strong feelings against chemotherapy.   after going to multiple discussions, with the encouragement of her daughters, she was willing to pursue chemo.  Because she did not undergo cardiac bypass surgery as recommended by her cardiologist, I am not prepared to give her Adriamycin.  She will therefore receive Taxotere and Cytoxan.  I discussed the risks and benefits of chemotherapy in great detail and provided her with written information.  Because of the urgency to start her chemo, we will try to expedite the entire work-up.  All questions were answered. The patient knows to call the clinic with any problems, questions or concerns.    Harriette Ohara, MD 04/11/18

## 2018-04-11 NOTE — Progress Notes (Signed)
START ON PATHWAY REGIMEN - Breast     A cycle is every 21 days:     Docetaxel      Cyclophosphamide   **Always confirm dose/schedule in your pharmacy ordering system**  Patient Characteristics: Preoperative or Nonsurgical Candidate (Clinical Staging), Neoadjuvant Therapy followed by Surgery, Invasive Disease, Chemotherapy, HER2 Negative/Unknown/Equivocal, ER Positive Therapeutic Status: Preoperative or Nonsurgical Candidate (Clinical Staging) AJCC M Category: cM0 AJCC Grade: G3 Breast Surgical Plan: Neoadjuvant Therapy followed by Surgery ER Status: Positive (+) AJCC 8 Stage Grouping: IIIC HER2 Status: Negative (-) AJCC T Category: cT4c AJCC N Category: cN0 PR Status: Negative (-) Intent of Therapy: Curative Intent, Discussed with Patient

## 2018-04-12 ENCOUNTER — Encounter: Payer: Medicare Other | Admitting: Dietician

## 2018-04-12 ENCOUNTER — Encounter: Payer: Self-pay | Admitting: *Deleted

## 2018-04-12 ENCOUNTER — Telehealth: Payer: Self-pay

## 2018-04-12 NOTE — Telephone Encounter (Signed)
Per Darlena, pt's CT of pelvis/ abd and Bone scan have been authorized and need to be scheduled. Appt made for Monday, July 15th at 11:45 am, [facility] radiology.  Notified pt and let her know that her bottles of contrast with instructions will be waiting for pick-up at her chemo education appt tomorrow, in their office - let her know that nothing to eat or drink 4 hours prior to CT on Monday- pt voiced understanding. No other needs per pt at this time.

## 2018-04-12 NOTE — Telephone Encounter (Signed)
Spoke with [name]. She is not interested in CGM or changing the prandin to Novolog insulin right now. (She begins chemotherapy on Tuesday.) She thinks the prandin is helping control her blood sugars, but because she overindulged this weekend, her blood sugars were higher. She agreed to instructions for increasing her prandin if needed.   Suggest giving her instructions to: For before breakfast or lunch meal CBG more than 250 mg/dl Add 1 pill prandin (2 mg) Max dose of prandin is 16 mg.day.

## 2018-04-13 ENCOUNTER — Inpatient Hospital Stay: Payer: Medicare Other

## 2018-04-13 ENCOUNTER — Encounter (HOSPITAL_COMMUNITY): Payer: Self-pay | Admitting: *Deleted

## 2018-04-13 ENCOUNTER — Telehealth: Payer: Self-pay | Admitting: Hematology and Oncology

## 2018-04-13 ENCOUNTER — Other Ambulatory Visit: Payer: Self-pay

## 2018-04-13 NOTE — Telephone Encounter (Signed)
Per 7/10 staff message Dr Lindi Adie can see patient on 7/16 @ 7:30. Delila Pereyra is going to complete the scheduling of this patient for 7/9 los

## 2018-04-13 NOTE — Telephone Encounter (Signed)
Gave patient avs and calendar of upcoming July and august appts. Patient scheduled 7/15&7/16 due to avail in treatment area.

## 2018-04-14 ENCOUNTER — Encounter (HOSPITAL_COMMUNITY): Payer: Self-pay | Admitting: *Deleted

## 2018-04-14 ENCOUNTER — Ambulatory Visit (HOSPITAL_COMMUNITY): Payer: Medicare Other | Admitting: Certified Registered Nurse Anesthetist

## 2018-04-14 ENCOUNTER — Ambulatory Visit (HOSPITAL_COMMUNITY)
Admission: RE | Admit: 2018-04-14 | Discharge: 2018-04-14 | Disposition: A | Discharge status: Home / Self Care | Payer: Medicare Other | Source: Ambulatory Visit | Attending: General Surgery | Admitting: General Surgery

## 2018-04-14 ENCOUNTER — Ambulatory Visit (HOSPITAL_COMMUNITY): Payer: Medicare Other

## 2018-04-14 ENCOUNTER — Encounter: Payer: Self-pay | Admitting: Hematology and Oncology

## 2018-04-14 ENCOUNTER — Other Ambulatory Visit: Payer: Self-pay

## 2018-04-14 ENCOUNTER — Other Ambulatory Visit (HOSPITAL_COMMUNITY): Payer: Self-pay | Admitting: General Surgery

## 2018-04-14 ENCOUNTER — Encounter (HOSPITAL_COMMUNITY)
Admission: RE | Disposition: A | Discharge status: Home / Self Care | Payer: Self-pay | Source: Ambulatory Visit | Attending: General Surgery

## 2018-04-14 DIAGNOSIS — Z7901 Long term (current) use of anticoagulants: Secondary | ICD-10-CM | POA: Diagnosis not present

## 2018-04-14 DIAGNOSIS — I10 Essential (primary) hypertension: Secondary | ICD-10-CM | POA: Insufficient documentation

## 2018-04-14 DIAGNOSIS — Z90711 Acquired absence of uterus with remaining cervical stump: Secondary | ICD-10-CM | POA: Insufficient documentation

## 2018-04-14 DIAGNOSIS — Z7982 Long term (current) use of aspirin: Secondary | ICD-10-CM | POA: Insufficient documentation

## 2018-04-14 DIAGNOSIS — Z79899 Other long term (current) drug therapy: Secondary | ICD-10-CM | POA: Diagnosis not present

## 2018-04-14 DIAGNOSIS — Z9889 Other specified postprocedural states: Secondary | ICD-10-CM | POA: Insufficient documentation

## 2018-04-14 DIAGNOSIS — Z794 Long term (current) use of insulin: Secondary | ICD-10-CM | POA: Insufficient documentation

## 2018-04-14 DIAGNOSIS — M1990 Unspecified osteoarthritis, unspecified site: Secondary | ICD-10-CM | POA: Diagnosis not present

## 2018-04-14 DIAGNOSIS — C50312 Malignant neoplasm of lower-inner quadrant of left female breast: Secondary | ICD-10-CM | POA: Diagnosis not present

## 2018-04-14 DIAGNOSIS — R011 Cardiac murmur, unspecified: Secondary | ICD-10-CM | POA: Diagnosis not present

## 2018-04-14 DIAGNOSIS — Z833 Family history of diabetes mellitus: Secondary | ICD-10-CM | POA: Diagnosis not present

## 2018-04-14 DIAGNOSIS — Z8249 Family history of ischemic heart disease and other diseases of the circulatory system: Secondary | ICD-10-CM | POA: Insufficient documentation

## 2018-04-14 DIAGNOSIS — I110 Hypertensive heart disease with heart failure: Secondary | ICD-10-CM | POA: Diagnosis not present

## 2018-04-14 DIAGNOSIS — C50112 Malignant neoplasm of central portion of left female breast: Secondary | ICD-10-CM | POA: Diagnosis not present

## 2018-04-14 DIAGNOSIS — E119 Type 2 diabetes mellitus without complications: Secondary | ICD-10-CM | POA: Insufficient documentation

## 2018-04-14 DIAGNOSIS — C50912 Malignant neoplasm of unspecified site of left female breast: Secondary | ICD-10-CM

## 2018-04-14 DIAGNOSIS — Z8261 Family history of arthritis: Secondary | ICD-10-CM | POA: Diagnosis not present

## 2018-04-14 DIAGNOSIS — Z452 Encounter for adjustment and management of vascular access device: Secondary | ICD-10-CM | POA: Diagnosis not present

## 2018-04-14 DIAGNOSIS — J939 Pneumothorax, unspecified: Secondary | ICD-10-CM

## 2018-04-14 DIAGNOSIS — Z888 Allergy status to other drugs, medicaments and biological substances status: Secondary | ICD-10-CM | POA: Diagnosis not present

## 2018-04-14 DIAGNOSIS — Z885 Allergy status to narcotic agent status: Secondary | ICD-10-CM | POA: Diagnosis not present

## 2018-04-14 DIAGNOSIS — I509 Heart failure, unspecified: Secondary | ICD-10-CM | POA: Diagnosis not present

## 2018-04-14 DIAGNOSIS — I517 Cardiomegaly: Secondary | ICD-10-CM | POA: Diagnosis not present

## 2018-04-14 HISTORY — PX: PORTACATH PLACEMENT: SHX2246

## 2018-04-14 LAB — CBC
HEMATOCRIT: 38.2 % (ref 36.0–46.0)
HEMOGLOBIN: 12.3 g/dL (ref 12.0–15.0)
MCH: 29.1 pg (ref 26.0–34.0)
MCHC: 32.2 g/dL (ref 30.0–36.0)
MCV: 90.5 fL (ref 78.0–100.0)
Platelets: 278 10*3/uL (ref 150–400)
RBC: 4.22 MIL/uL (ref 3.87–5.11)
RDW: 15 % (ref 11.5–15.5)
WBC: 8.3 10*3/uL (ref 4.0–10.5)

## 2018-04-14 LAB — GLUCOSE, CAPILLARY
GLUCOSE-CAPILLARY: 145 mg/dL — AB (ref 70–99)
Glucose-Capillary: 143 mg/dL — ABNORMAL HIGH (ref 70–99)

## 2018-04-14 LAB — BASIC METABOLIC PANEL
ANION GAP: 9 (ref 5–15)
BUN: 20 mg/dL (ref 8–23)
CO2: 25 mmol/L (ref 22–32)
Calcium: 10.1 mg/dL (ref 8.9–10.3)
Chloride: 106 mmol/L (ref 98–111)
Creatinine, Ser: 0.86 mg/dL (ref 0.44–1.00)
GFR calc Af Amer: >60 mL/min (ref >60)
GLUCOSE: 161 mg/dL — AB (ref 70–99)
Potassium: 4.1 mmol/L (ref 3.5–5.1)
Sodium: 140 mmol/L (ref 135–145)

## 2018-04-14 SURGERY — INSERTION, TUNNELED CENTRAL VENOUS DEVICE, WITH PORT
Anesthesia: General

## 2018-04-14 MED ORDER — METOPROLOL TARTRATE 5 MG/5ML IV SOLN
INTRAVENOUS | Status: DC | PRN
  Administered 2018-04-14: 2.5 mg via INTRAVENOUS

## 2018-04-14 MED ORDER — PROPOFOL 10 MG/ML IV BOLUS
INTRAVENOUS | Status: AC
  Filled 2018-04-14: qty 20

## 2018-04-14 MED ORDER — MIDAZOLAM HCL 5 MG/5ML IJ SOLN
INTRAMUSCULAR | Status: DC | PRN
  Administered 2018-04-14 (×2): 1 mg via INTRAVENOUS

## 2018-04-14 MED ORDER — SODIUM CHLORIDE 0.9 % IV SOLN
INTRAVENOUS | Status: DC | PRN
  Administered 2018-04-14: 40 ug/min via INTRAVENOUS

## 2018-04-14 MED ORDER — BUPIVACAINE HCL (PF) 0.25 % IJ SOLN
INTRAMUSCULAR | Status: AC
  Filled 2018-04-14: qty 30

## 2018-04-14 MED ORDER — MIDAZOLAM HCL 2 MG/2ML IJ SOLN
INTRAMUSCULAR | Status: AC
  Filled 2018-04-14: qty 2

## 2018-04-14 MED ORDER — FENTANYL CITRATE (PF) 100 MCG/2ML IJ SOLN
INTRAMUSCULAR | Status: AC
  Filled 2018-04-14: qty 2

## 2018-04-14 MED ORDER — FENTANYL CITRATE (PF) 100 MCG/2ML IJ SOLN
INTRAMUSCULAR | Status: DC | PRN
  Administered 2018-04-14: 25 ug via INTRAVENOUS

## 2018-04-14 MED ORDER — SODIUM CHLORIDE 0.9 % IV SOLN
Freq: Once | INTRAVENOUS | Status: AC
  Administered 2018-04-14: 08:00:00
  Filled 2018-04-14: qty 1.2

## 2018-04-14 MED ORDER — HYDROCODONE-ACETAMINOPHEN 5-325 MG PO TABS: 1.0000 | ORAL_TABLET | Freq: Four times a day (QID) | ORAL | 0 refills | Status: DC | PRN

## 2018-04-14 MED ORDER — CEFAZOLIN SODIUM-DEXTROSE 2-4 GM/100ML-% IV SOLN
INTRAVENOUS | Status: AC
  Filled 2018-04-14: qty 100

## 2018-04-14 MED ORDER — BUPIVACAINE HCL (PF) 0.25 % IJ SOLN
INTRAMUSCULAR | Status: DC | PRN
  Administered 2018-04-14: 7 mL

## 2018-04-14 MED ORDER — CEFAZOLIN SODIUM-DEXTROSE 2-3 GM-%(50ML) IV SOLR
INTRAVENOUS | Status: DC | PRN
  Administered 2018-04-14: 2 g via INTRAVENOUS

## 2018-04-14 MED ORDER — HEPARIN SOD (PORK) LOCK FLUSH 100 UNIT/ML IV SOLN
INTRAVENOUS | Status: AC
  Filled 2018-04-14: qty 5

## 2018-04-14 MED ORDER — ONDANSETRON HCL 4 MG/2ML IJ SOLN
INTRAMUSCULAR | Status: DC | PRN
  Administered 2018-04-14: 4 mg via INTRAVENOUS

## 2018-04-14 MED ORDER — LIDOCAINE HCL (CARDIAC) PF 100 MG/5ML IV SOSY
PREFILLED_SYRINGE | INTRAVENOUS | Status: DC | PRN
  Administered 2018-04-14: 60 mg via INTRAVENOUS

## 2018-04-14 MED ORDER — LACTATED RINGERS IV SOLN
INTRAVENOUS | Status: DC
  Administered 2018-04-14: 06:00:00 via INTRAVENOUS

## 2018-04-14 MED ORDER — PROPOFOL 10 MG/ML IV BOLUS
INTRAVENOUS | Status: DC | PRN
  Administered 2018-04-14: 120 mg via INTRAVENOUS

## 2018-04-14 SURGICAL SUPPLY — 41 items
ADH SKN CLS APL DERMABOND .7 (GAUZE/BANDAGES/DRESSINGS)
APL SKNCLS STERI-STRIP NONHPOA (GAUZE/BANDAGES/DRESSINGS)
BAG DECANTER FOR FLEXI CONT (MISCELLANEOUS) ×3 IMPLANT
BENZOIN TINCTURE PRP APPL 2/3 (GAUZE/BANDAGES/DRESSINGS) IMPLANT
BLADE HEX COATED 2.75 (ELECTRODE) ×3 IMPLANT
BLADE SURG 15 STRL LF DISP TIS (BLADE) ×1 IMPLANT
BLADE SURG 15 STRL SS (BLADE) ×3
CLOSURE WOUND 1/2 X4 (GAUZE/BANDAGES/DRESSINGS)
DECANTER SPIKE VIAL GLASS SM (MISCELLANEOUS) ×3 IMPLANT
DERMABOND ADVANCED (GAUZE/BANDAGES/DRESSINGS)
DERMABOND ADVANCED .7 DNX12 (GAUZE/BANDAGES/DRESSINGS) IMPLANT
DRAPE C-ARM 42X120 X-RAY (DRAPES) ×3 IMPLANT
DRAPE LAPAROSCOPIC ABDOMINAL (DRAPES) ×3 IMPLANT
DRSG TEGADERM 2-3/8X2-3/4 SM (GAUZE/BANDAGES/DRESSINGS) IMPLANT
DRSG TEGADERM 4X4.75 (GAUZE/BANDAGES/DRESSINGS) IMPLANT
ELECT PENCIL ROCKER SW 15FT (MISCELLANEOUS) ×3 IMPLANT
ELECT REM PT RETURN 15FT ADLT (MISCELLANEOUS) ×3 IMPLANT
GAUZE 4X4 16PLY RFD (DISPOSABLE) ×3 IMPLANT
GAUZE SPONGE 4X4 12PLY STRL (GAUZE/BANDAGES/DRESSINGS) IMPLANT
GLOVE BIO SURGEON STRL SZ7.5 (GLOVE) ×4 IMPLANT
GLOVE BIOGEL PI IND STRL 7.0 (GLOVE) ×1 IMPLANT
GLOVE BIOGEL PI INDICATOR 7.0 (GLOVE) ×2
GOWN STRL REUS W/ TWL XL LVL3 (GOWN DISPOSABLE) ×1 IMPLANT
GOWN STRL REUS W/TWL LRG LVL3 (GOWN DISPOSABLE) ×3 IMPLANT
GOWN STRL REUS W/TWL XL LVL3 (GOWN DISPOSABLE) ×6 IMPLANT
KIT BASIN OR (CUSTOM PROCEDURE TRAY) ×3 IMPLANT
KIT PORT POWER 8FR ISP CVUE (Port) IMPLANT
NDL HYPO 25X1 1.5 SAFETY (NEEDLE) ×1 IMPLANT
NEEDLE HYPO 25X1 1.5 SAFETY (NEEDLE) ×3 IMPLANT
NS IRRIG 1000ML POUR BTL (IV SOLUTION) ×1 IMPLANT
PACK BASIC VI WITH GOWN DISP (CUSTOM PROCEDURE TRAY) ×3 IMPLANT
STRIP CLOSURE SKIN 1/2X4 (GAUZE/BANDAGES/DRESSINGS) IMPLANT
SUT MNCRL AB 4-0 PS2 18 (SUTURE) ×3 IMPLANT
SUT PROLENE 2 0 SH DA (SUTURE) ×3 IMPLANT
SUT SILK 2 0 (SUTURE)
SUT SILK 2-0 30XBRD TIE 12 (SUTURE) IMPLANT
SUT VIC AB 3-0 SH 27 (SUTURE) ×3
SUT VIC AB 3-0 SH 27XBRD (SUTURE) ×1 IMPLANT
SYR 10ML LL (SYRINGE) ×3 IMPLANT
SYR CONTROL 10ML LL (SYRINGE) ×3 IMPLANT
TOWEL OR 17X26 10 PK STRL BLUE (TOWEL DISPOSABLE) ×3 IMPLANT

## 2018-04-14 NOTE — Anesthesia Postprocedure Evaluation (Signed)
Anesthesia Post Note  Patient: [name]  Procedure(s) Performed: ATTEMPED INSERTION PORT-A-CATH (N/A )     Patient location during evaluation: PACU Anesthesia Type: General Level of consciousness: awake and alert Pain management: pain level controlled Vital Signs Assessment: post-procedure vital signs reviewed and stable Respiratory status: spontaneous breathing, nonlabored ventilation, respiratory function stable and patient connected to nasal cannula oxygen Cardiovascular status: blood pressure returned to baseline and stable Postop Assessment: no apparent nausea or vomiting Anesthetic complications: no    Last Vitals:  Vitals:   04/14/18 1000 04/14/18 1024  BP:  (P) 118/77  Pulse:  (P) 80  Resp: 16 (P) 16  Temp: 36.4 C   SpO2:  (P) 98%    Last Pain:  Vitals:   04/14/18 0945  TempSrc:   PainSc: 0-No pain                 Latorsha Curling

## 2018-04-14 NOTE — Transfer of Care (Signed)
Immediate Anesthesia Transfer of Care Note  Patient: [name]  Procedure(s) Performed: ATTEMPED INSERTION PORT-A-CATH (N/A )  Patient Location: PACU  Anesthesia Type:General  Level of Consciousness: awake, oriented, drowsy, patient cooperative and responds to stimulation  Airway & Oxygen Therapy: Patient Spontanous Breathing and Patient connected to face mask oxygen  Post-op Assessment: Report given to RN, Post -op Vital signs reviewed and stable and Patient moving all extremities  Post vital signs: Reviewed and stable  Last Vitals:  Vitals Value Taken Time  BP 125/63 04/14/2018  8:48 AM  Temp 36.4 C 04/14/2018  8:48 AM  Pulse 76 04/14/2018  8:57 AM  Resp 18 04/14/2018  8:57 AM  SpO2 100 % 04/14/2018  8:57 AM  Vitals shown include unvalidated device data.  Last Pain:  Vitals:   04/14/18 0848  TempSrc:   PainSc: 0-No pain      Patients Stated Pain Goal: 3 (11/73/56 [id number])  Complications: No apparent anesthesia complications

## 2018-04-14 NOTE — Interval H&P Note (Signed)
History and Physical Interval Note:  04/14/2018 7:11 AM  [name]  has presented today for surgery, with the diagnosis of LEFT BREAST CANCER  The various methods of treatment have been discussed with the patient and family. After consideration of risks, benefits and other options for treatment, the patient has consented to  Procedure(s): INSERTION PORT-A-CATH (N/A) as a surgical intervention .  The patient's history has been reviewed, patient examined, no change in status, stable for surgery.  I have reviewed the patient's chart and labs.  Questions were answered to the patient's satisfaction.     TOTH III,Jennae Hakeem S

## 2018-04-14 NOTE — H&P (Signed)
[name]  Location: [facility] Patient #: [id number] DOB: [birth year] Divorced / Language: [place] / Race: Black or African American, Black or [place] American Female   History of Present Illness  The patient is a 67 year old female who presents with breast cancer. We are asked to see the patient in consultation by Dr. Curlene Dolphin to evaluate her for a new left breast cancer. The patient is a 67 year old black female who began with an area of redness that showed up on the upper inner left breast in May. This redness has quickly spread to most of the central area of the left breast. The mass is painful and causes itching. She has been treated with antibiotics with little improvement. She has also been taking some sort of herbal treatment for this which she feels like has helped more than anything. She reports that she had a breast cancer treated with lumpectomy and radiation in 2006 on the left side. She also reports that she was supposed to have a cardiac bypass several months ago but declined. She is followed by Dr. Percival Spanish.   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 Hysterectomy (not due to cancer) - Partial  Sentinel Lymph Node Biopsy  Tonsillectomy   Diagnostic Studies History  Colonoscopy  1-5 years ago Mammogram  within last year  Allergies  Percocet *ANALGESICS - OPIOID*  Lisinopril *ANTIHYPERTENSIVES*  OxyCODONE HCl *ANALGESICS - OPIOID*  Atorvastatin Calcium *ANTIHYPERLIPIDEMICS*  Allergies Reconciled   Medication History AmLODIPine Besylate (10MG  Tablet, Oral) Active. Aspirin (81MG  Tablet, Oral) Active. Bilberry (Vaccinium myrtillus) (100MG  Capsule, Oral) Active. CloNIDine HCl (0.2MG  Tablet, Oral) Active. Furosemide (20MG  Tablet, Oral) Active. Glucose (4GM  Tablet Chewable, Oral) Active. Hydrocortisone (2.5% Lotion, External) Active. Ibuprofen (800MG  Tablet, Oral) Active. HumuLIN 70/30 ((70-30) 100UNIT/ML Suspension, Subcutaneous) Active. Loratadine (10MG  Capsule, Oral) Active. Losartan Potassium (50MG  Tablet, Oral) Active. MetFORMIN HCl (1000MG  Tablet, Oral) Active. Metoprolol Succinate ER (100MG  Tablet ER 24HR, Oral) Active. Multivitamin Adult (Oral) Active. Nitroglycerin (0.4MG  Tab Sublingual, Sublingual) Active. Olopatadine HCl (0.2% Solution, Ophthalmic) Active. Polyethylene Glycol 8000 Active. Coumadin (1MG  Tablet, Oral) Active. MetFORMIN HCl (500MG  Tablet, Oral) Active. Losartan Potassium (100MG  Tablet, Oral) Active. NovoLOG Mix 70/30 ((70-30) 100UNIT/ML Suspension, Subcutaneous) Active. Medications Reconciled  Social History  Caffeine use  Carbonated beverages, Coffee, Tea. No alcohol use  No drug use  Tobacco use  Never smoker.  Family History  Alcohol Abuse  Father, Mother. Arthritis  Father. Colon Polyps  Father. Depression  Father. Diabetes Mellitus  Father. Hypertension  Father. Migraine Headache  Mother.  Pregnancy / Birth History  Age at menarche  41 years. Gravida  4 Irregular periods  Maternal age  62-20 Para  2  Other Problems  Arthritis  Back Pain  Diabetes Mellitus  Heart murmur  High blood pressure     Review of Systems  General Not Present- Appetite Loss, Chills, Fatigue, Fever, Night Sweats, Weight Gain and Weight Loss. Note: All other systems negative (unless as noted in HPI & included Review of Systems) Skin Not Present- Change in Wart/Mole, Dryness, Hives, Jaundice, New Lesions, Non-Healing Wounds, Rash and Ulcer. HEENT Not Present- Earache, Hearing Loss, Hoarseness, Nose Bleed, Oral Ulcers, Ringing in the Ears, Seasonal Allergies, Sinus Pain, Sore Throat, Visual Disturbances, Wears glasses/contact lenses and Yellow Eyes. Respiratory Not Present- Bloody  sputum, Chronic Cough, Difficulty Breathing, Snoring  and Wheezing. Breast Not Present- Breast Mass, Breast Pain, Nipple Discharge and Skin Changes. Cardiovascular Not Present- Chest Pain, Difficulty Breathing Lying Down, Leg Cramps, Palpitations, Rapid Heart Rate, Shortness of Breath and Swelling of Extremities. Gastrointestinal Not Present- Abdominal Pain, Bloating, Bloody Stool, Change in Bowel Habits, Chronic diarrhea, Constipation, Difficulty Swallowing, Excessive gas, Gets full quickly at meals, Hemorrhoids, Indigestion, Nausea, Rectal Pain and Vomiting. Female Genitourinary Not Present- Frequency, Nocturia, Painful Urination, Pelvic Pain and Urgency. Musculoskeletal Not Present- Back Pain, Joint Pain, Joint Stiffness, Muscle Pain, Muscle Weakness and Swelling of Extremities. Neurological Not Present- Decreased Memory, Fainting, Headaches, Numbness, Seizures, Tingling, Tremor, Trouble walking and Weakness. Psychiatric Not Present- Anxiety, Bipolar, Change in Sleep Pattern, Depression, Fearful and Frequent crying. Endocrine Not Present- Cold Intolerance, Excessive Hunger, Hair Changes, Heat Intolerance, Hot flashes and New Diabetes. Hematology Not Present- Easy Bruising, Excessive bleeding, Gland problems, HIV and Persistent Infections.  Vitals  Weight: 210 lb Height: 61in Body Surface Area: 1.93 m Body Mass Index: 39.68 kg/m  Temp.: 98.39F(Oral)  Pulse: 84 (Regular)  BP: 130/74 (Sitting, Left Arm, Standard)       Physical Exam General Mental Status-Alert. General Appearance-Consistent with stated age. Hydration-Well hydrated. Voice-Normal.  Head and Neck Head-normocephalic, atraumatic with no lesions or palpable masses. Trachea-midline. Thyroid Gland Characteristics - normal size and consistency.  Eye Eyeball - Bilateral-Extraocular movements intact. Sclera/Conjunctiva - Bilateral-No scleral icterus.  Chest and Lung Exam Chest and lung exam  reveals -quiet, even and easy respiratory effort with no use of accessory muscles and on auscultation, normal breath sounds, no adventitious sounds and normal vocal resonance. Inspection Chest Wall - Normal. Back - normal.  Breast Note: There is a large palpable mass involving the central portion of the left breast. There is significant redness and thickening to the skin centrally in the left breast. There is no palpable mass in the right breast. There is no palpable axillary, supraclavicular, or cervical lymphadenopathy.   Cardiovascular Cardiovascular examination reveals -normal heart sounds, regular rate and rhythm with no murmurs and normal pedal pulses bilaterally.  Abdomen Inspection Inspection of the abdomen reveals - No Hernias. Skin - Scar - no surgical scars. Palpation/Percussion Palpation and Percussion of the abdomen reveal - Soft, Non Tender, No Rebound tenderness, No Rigidity (guarding) and No hepatosplenomegaly. Auscultation Auscultation of the abdomen reveals - Bowel sounds normal.  Neurologic Neurologic evaluation reveals -alert and oriented x 3 with no impairment of recent or remote memory. Mental Status-Normal.  Musculoskeletal Normal Exam - Left-Upper Extremity Strength Normal and Lower Extremity Strength Normal. Normal Exam - Right-Upper Extremity Strength Normal and Lower Extremity Strength Normal.  Lymphatic Head & Neck  General Head & Neck Lymphatics: Bilateral - Description - Normal. Axillary  General Axillary Region: Bilateral - Description - Normal. Tenderness - Non Tender. Femoral & Inguinal  Generalized Femoral & Inguinal Lymphatics: Bilateral - Description - Normal. Tenderness - Non Tender.     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 At this point my recommendation would be for  neoadjuvant chemotherapy. I would like to refer her to medical oncology to consider this. She is leaning towards no chemotherapy and just surgery. She would require a left mastectomy with sentinel node mapping and possible node dissection. She understands that there is a chance of having a close or positive margins given the inflammatory nature of the cancer. I have discussed with her in  detail the risks and benefits of the operation as well as somewhat technical aspects and she understands and wishes to proceed. She does have some cardiac history so we will get cardiac clearance from Dr. Percival Spanish prior to scheduling surgery. I also discussed with her the risks and benefits of placing a Port-A-Cath and she understands. Current Plans Referred to Oncology, for evaluation and follow up (Oncology). Routine.

## 2018-04-14 NOTE — Telephone Encounter (Signed)
Thank you. I agree now not best time to trial CGM

## 2018-04-14 NOTE — Anesthesia Preprocedure Evaluation (Signed)
Anesthesia Evaluation  Patient identified by MRN, date of birth, ID band Patient awake    Reviewed: Allergy & Precautions, NPO status , Patient's Chart, lab work & pertinent test results  History of Anesthesia Complications Negative for: history of anesthetic complications  Airway Mallampati: III  TM Distance: >3 FB Neck ROM: Full    Dental  (+) Teeth Intact   Pulmonary sleep apnea ,    breath sounds clear to auscultation       Cardiovascular hypertension, Pt. on medications and Pt. on home beta blockers + angina + CAD, + Peripheral Vascular Disease and +CHF   Rhythm:Regular  Denies CP/NTG use since 1/19 cardiology visit, denies SOB and completed cardiac rehab   Neuro/Psych  Neuromuscular disease    GI/Hepatic Neg liver ROS, GERD  Medicated and Controlled,  Endo/Other  diabetes, Insulin DependentMorbid obesity  Renal/GU      Musculoskeletal  (+) Arthritis ,   Abdominal   Peds  Hematology negative hematology ROS (+)   Anesthesia Other Findings The patient presents for follow up of  CAD.   He was hospitalized last in November 2018 with chest discomfort. She was found to have 70 - 80% LAD stenosis that was significant by FFR.  There was 90 - 95% OM1 stenosis, prox circ 80% a nd mid circ 90% stenosis.  There was high grade disease in a small RI and non domimnant right.  EF was NL.   proximal RCA stenosis, first marginal 80% stenosis, mid LAD 70% stenosis, distal LAD 70% stenosis, distal circumflex 70% stenosis in the circumflex 60% stenosis. The EF was 55%.  However, it was decided to manage her medically at that time.  CABG was discussed but she is unable to consider a blood transfusion so after discussion opted for medical management.  Reproductive/Obstetrics                             Anesthesia Physical Anesthesia Plan  ASA: III  Anesthesia Plan: General   Post-op Pain Management:     Induction: Intravenous  PONV Risk Score and Plan: 3 and Ondansetron and Dexamethasone  Airway Management Planned: LMA  Additional Equipment: None  Intra-op Plan:   Post-operative Plan: Extubation in OR  Informed Consent: I have reviewed the patients History and Physical, chart, labs and discussed the procedure including the risks, benefits and alternatives for the proposed anesthesia with the patient or authorized representative who has indicated his/her understanding and acceptance.   Dental advisory given  Plan Discussed with: CRNA and Surgeon  Anesthesia Plan Comments:         Anesthesia Quick Evaluation

## 2018-04-14 NOTE — Progress Notes (Signed)
Called patient to introduce myself as Arboriculturist and to discuss available resources such as Advertising account executive. Left my contact name and number via voicemail.

## 2018-04-14 NOTE — Anesthesia Procedure Notes (Signed)
Procedure Name: LMA Insertion Date/Time: 04/14/2018 7:49 AM Performed by: Oleta Mouse, MD Pre-anesthesia Checklist: Patient identified, Suction available, Emergency Drugs available, Patient being monitored and Timeout performed Patient Re-evaluated:Patient Re-evaluated prior to induction Oxygen Delivery Method: Circle system utilized Preoxygenation: Pre-oxygenation with 100% oxygen Induction Type: IV induction LMA: LMA with gastric port inserted LMA Size: 4.0 Number of attempts: 1 Placement Confirmation: positive ETCO2 Dental Injury: Teeth and Oropharynx as per pre-operative assessment

## 2018-04-14 NOTE — Op Note (Signed)
04/14/2018  8:38 AM  PATIENT:  [name]  67 y.o. female  PRE-OPERATIVE DIAGNOSIS:  LEFT BREAST CANCER  POST-OPERATIVE DIAGNOSIS:  LEFT BREAST CANCER  PROCEDURE:  Procedure(s): INSERTION PORT-A-CATH, UNSUCCESSFUL  SURGEON:  Surgeon(s) and Role:    * Toth, Paul III, MD - Primary  PHYSICIAN ASSISTANT:   ASSISTANTS: none   ANESTHESIA:   local and general  EBL:  minimal   BLOOD ADMINISTERED:none  DRAINS: none   LOCAL MEDICATIONS USED:  MARCAINE     SPECIMEN:  No Specimen  DISPOSITION OF SPECIMEN:  N/A  COUNTS:  YES  TOURNIQUET:  * No tourniquets in log *  DICTATION: .Dragon Dictation   After informed consent was obtained the patient was brought to the operating room and placed in the supine position on the operating table.  After adequate induction of general anesthesia roll was placed between the patient's shoulder blades to extend the shoulder slightly.  The right chest and neck area were then prepped with ChloraPrep, allowed to dry, and draped in usual sterile manner.  An appropriate timeout was performed.  The area lateral to the bend of the clavicle on the right chest wall was infiltrated with quarter percent Marcaine.  A large bore finder needle from the Port-A-Cath kit was used to slide beneath the bend of the clavicle heading towards the sternal notch and in doing so I was able to access the right subclavian vein without difficulty.  A wire was fed through the needle but the wire would repeatedly stop and not feed.  After multiple attempts with confirmation that we were in the subclavian vein the wire still would not feed so we stopped at this point and held pressure for several minutes.  I then moved up to the right internal jugular vein.  I was able to visualize it with the ultrasound and access the right internal jugular vein with the large bore needle.  Again I was unable to feed the wire.  After multiple attempts I did get some air return on the needle but the  needle was up in the neck away from where we would expect to see the the top of the lung.  It is certainly possible that we aspirated some air from the trachea.  At this point the operation was stopped.  Pressure was held at both sites for several minutes until the areas seemed hemostatic.  The patient had tolerated this very well and was in stable condition.  At the end of the case all needle sponge and instrument counts were correct.  The patient was then awakened and taken to recovery in stable condition.  A stat chest x-ray will be obtained.  PLAN OF CARE: Discharge to home after PACU  PATIENT DISPOSITION:  PACU - hemodynamically stable.   Delay start of Pharmacological VTE agent (>24hrs) due to surgical blood loss or risk of bleeding: not applicable  

## 2018-04-14 NOTE — Discharge Instructions (Addendum)

## 2018-04-16 ENCOUNTER — Other Ambulatory Visit: Payer: Self-pay | Admitting: Radiology

## 2018-04-17 ENCOUNTER — Encounter (HOSPITAL_COMMUNITY): Payer: Self-pay

## 2018-04-17 ENCOUNTER — Ambulatory Visit (HOSPITAL_COMMUNITY)
Admission: RE | Admit: 2018-04-17 | Discharge: 2018-04-17 | Disposition: A | Discharge status: Home / Self Care | Payer: Medicare Other | Source: Ambulatory Visit | Attending: General Surgery | Admitting: General Surgery

## 2018-04-17 ENCOUNTER — Inpatient Hospital Stay: Payer: Medicare Other

## 2018-04-17 ENCOUNTER — Ambulatory Visit (HOSPITAL_COMMUNITY): Admission: RE | Admit: 2018-04-17 | Payer: Medicare Other | Source: Ambulatory Visit

## 2018-04-17 ENCOUNTER — Inpatient Hospital Stay: Payer: Medicare Other | Admitting: Hematology and Oncology

## 2018-04-17 ENCOUNTER — Telehealth: Payer: Self-pay

## 2018-04-17 ENCOUNTER — Encounter (HOSPITAL_COMMUNITY): Payer: Medicare Other

## 2018-04-17 ENCOUNTER — Encounter (HOSPITAL_COMMUNITY): Admission: RE | Admit: 2018-04-17 | Payer: Medicare Other | Source: Ambulatory Visit

## 2018-04-17 ENCOUNTER — Ambulatory Visit (HOSPITAL_COMMUNITY): Payer: Medicare Other

## 2018-04-17 DIAGNOSIS — Z803 Family history of malignant neoplasm of breast: Secondary | ICD-10-CM | POA: Diagnosis not present

## 2018-04-17 DIAGNOSIS — G8929 Other chronic pain: Secondary | ICD-10-CM | POA: Insufficient documentation

## 2018-04-17 DIAGNOSIS — C50312 Malignant neoplasm of lower-inner quadrant of left female breast: Secondary | ICD-10-CM | POA: Diagnosis not present

## 2018-04-17 DIAGNOSIS — E119 Type 2 diabetes mellitus without complications: Secondary | ICD-10-CM | POA: Diagnosis not present

## 2018-04-17 DIAGNOSIS — G4733 Obstructive sleep apnea (adult) (pediatric): Secondary | ICD-10-CM | POA: Diagnosis not present

## 2018-04-17 DIAGNOSIS — I872 Venous insufficiency (chronic) (peripheral): Secondary | ICD-10-CM | POA: Insufficient documentation

## 2018-04-17 DIAGNOSIS — C50912 Malignant neoplasm of unspecified site of left female breast: Secondary | ICD-10-CM | POA: Diagnosis not present

## 2018-04-17 DIAGNOSIS — M545 Low back pain: Secondary | ICD-10-CM | POA: Insufficient documentation

## 2018-04-17 DIAGNOSIS — Z7982 Long term (current) use of aspirin: Secondary | ICD-10-CM | POA: Diagnosis not present

## 2018-04-17 DIAGNOSIS — K219 Gastro-esophageal reflux disease without esophagitis: Secondary | ICD-10-CM | POA: Diagnosis not present

## 2018-04-17 DIAGNOSIS — E669 Obesity, unspecified: Secondary | ICD-10-CM | POA: Insufficient documentation

## 2018-04-17 DIAGNOSIS — M1990 Unspecified osteoarthritis, unspecified site: Secondary | ICD-10-CM | POA: Diagnosis not present

## 2018-04-17 DIAGNOSIS — Z6839 Body mass index (BMI) 39.0-39.9, adult: Secondary | ICD-10-CM | POA: Insufficient documentation

## 2018-04-17 DIAGNOSIS — E785 Hyperlipidemia, unspecified: Secondary | ICD-10-CM | POA: Insufficient documentation

## 2018-04-17 DIAGNOSIS — I2510 Atherosclerotic heart disease of native coronary artery without angina pectoris: Secondary | ICD-10-CM | POA: Insufficient documentation

## 2018-04-17 DIAGNOSIS — Z794 Long term (current) use of insulin: Secondary | ICD-10-CM | POA: Insufficient documentation

## 2018-04-17 DIAGNOSIS — I5032 Chronic diastolic (congestive) heart failure: Secondary | ICD-10-CM | POA: Diagnosis not present

## 2018-04-17 DIAGNOSIS — Z5111 Encounter for antineoplastic chemotherapy: Secondary | ICD-10-CM | POA: Diagnosis not present

## 2018-04-17 DIAGNOSIS — I110 Hypertensive heart disease with heart failure: Secondary | ICD-10-CM | POA: Insufficient documentation

## 2018-04-17 HISTORY — PX: IR IMAGING GUIDED PORT INSERTION: IMG5740

## 2018-04-17 LAB — CBC
HCT: 36.3 % (ref 36.0–46.0)
HEMOGLOBIN: 11.4 g/dL — AB (ref 12.0–15.0)
MCH: 29.4 pg (ref 26.0–34.0)
MCHC: 31.4 g/dL (ref 30.0–36.0)
MCV: 93.6 fL (ref 78.0–100.0)
Platelets: 234 10*3/uL (ref 150–400)
RBC: 3.88 MIL/uL (ref 3.87–5.11)
RDW: 14.8 % (ref 11.5–15.5)
WBC: 7.3 10*3/uL (ref 4.0–10.5)

## 2018-04-17 LAB — GLUCOSE, CAPILLARY: Glucose-Capillary: 158 mg/dL — ABNORMAL HIGH (ref 70–99)

## 2018-04-17 LAB — PROTIME-INR
INR: 0.96
PROTHROMBIN TIME: 12.7 s (ref 11.4–15.2)

## 2018-04-17 LAB — APTT: aPTT: 30 seconds (ref 24–36)

## 2018-04-17 MED ORDER — CEFAZOLIN SODIUM-DEXTROSE 2-4 GM/100ML-% IV SOLN
2.0000 g | Freq: Once | INTRAVENOUS | Status: AC
  Administered 2018-04-17: 2 g via INTRAVENOUS

## 2018-04-17 MED ORDER — LIDOCAINE HCL 1 % IJ SOLN
INTRAMUSCULAR | Status: AC | PRN
  Administered 2018-04-17: 10 mL

## 2018-04-17 MED ORDER — MIDAZOLAM HCL 2 MG/2ML IJ SOLN
INTRAMUSCULAR | Status: AC
  Filled 2018-04-17: qty 2

## 2018-04-17 MED ORDER — FENTANYL CITRATE (PF) 100 MCG/2ML IJ SOLN
INTRAMUSCULAR | Status: AC | PRN
  Administered 2018-04-17 (×2): 25 ug via INTRAVENOUS
  Administered 2018-04-17: 50 ug via INTRAVENOUS

## 2018-04-17 MED ORDER — LIDOCAINE-EPINEPHRINE 2 %-1:100000 IJ SOLN
INTRAMUSCULAR | Status: AC | PRN
  Administered 2018-04-17: 20 mL

## 2018-04-17 MED ORDER — LIDOCAINE-EPINEPHRINE (PF) 1 %-1:200000 IJ SOLN
INTRAMUSCULAR | Status: AC
  Filled 2018-04-17: qty 30

## 2018-04-17 MED ORDER — MIDAZOLAM HCL 2 MG/2ML IJ SOLN
INTRAMUSCULAR | Status: AC | PRN
  Administered 2018-04-17 (×3): 1 mg via INTRAVENOUS

## 2018-04-17 MED ORDER — HEPARIN SOD (PORK) LOCK FLUSH 100 UNIT/ML IV SOLN
INTRAVENOUS | Status: AC
  Filled 2018-04-17: qty 5

## 2018-04-17 MED ORDER — LIDOCAINE HCL 1 % IJ SOLN
INTRAMUSCULAR | Status: AC
  Filled 2018-04-17: qty 20

## 2018-04-17 MED ORDER — FENTANYL CITRATE (PF) 100 MCG/2ML IJ SOLN
INTRAMUSCULAR | Status: AC
  Filled 2018-04-17: qty 2

## 2018-04-17 MED ORDER — SODIUM CHLORIDE 0.9 % IV SOLN
INTRAVENOUS | Status: AC | PRN
  Administered 2018-04-17: 10 mL/h via INTRAVENOUS

## 2018-04-17 MED ORDER — CEFAZOLIN SODIUM-DEXTROSE 2-4 GM/100ML-% IV SOLN
INTRAVENOUS | Status: AC
  Filled 2018-04-17: qty 100

## 2018-04-17 NOTE — Procedures (Signed)
Placement of right jugular port.  Tip at SVC/RA junction.  Minimal blood loss and no immediate complication.  

## 2018-04-17 NOTE — Telephone Encounter (Signed)
Pt daughter, Reuel Boom, called to notify our office that pt just got discharged from [facility]. S/P port placement. Pt is unable to make it to her CT scan and MD appt today.  Notified Dr.Gudena and okay to proceed with treatment tomorrow. CT scan rescheduled for 7/18 and bone scan 7/24. Pt to come back 7/23 for nadir check and md visit.   Reminded daughter that pt will need to take oral contrast tomorrow at 6 and 7am for 8am CT appt. Verbalized understanding and has no further questions.

## 2018-04-17 NOTE — H&P (Signed)
Chief Complaint: Patient was seen in consultation today for [name] a Cath placement at the request of [name],[name]  Referring Physician(s): [name],[name] V Gudena   Supervising Physician: [name]  Patient Status: [name] - [facility]  History of Present Illness: [name] is a 67 y.o. female   Left Breast Ca Hx 2006 Lumpectomy and Radiation New left Breast lesion + Cancer 03/2018 Now for chemo to start later in week PAC attempted with Holly Marlou Starks 04/14/2018-- unsuccessful Scheduled now for IR Port A Cath placement    Past Medical History:  Diagnosis Date  . Arthritis    "knees, lower back" (08/18/2017)  . 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 S/p lumpectomy and radiation.  Marland Kitchen CAD (coronary artery disease) 11/07   cath 11/11 :  3V CADsee report   . Cataract    small both eyes  . Chronic lower back pain   . Chronic pain    MR 08/07/10 :  Spondylosis L4-5 with B foraminal narrowing and L4 nerve root enchroachment B.  . Chronic venous insufficiency 2012   Has had full W/U incl ECHO, LFT's, Cr, Umicro, TSH, BNP. Symptomatic treatment.  . Colonic polyp 1995   Colonoscopies 1994, 2000, and 02/02/2011. Last had 9 polyps - Tubular adenoma and tubulovillous was the pathology results without high grade dysplasia  . Diabetes mellitus type II    Insulin dependent. Has worked with [name] R previously.  . Diastolic CHF, chronic ([place])    NL EF by echo 01/2012, Gr I dd  . Essential hypertension    Requires 5 drug therapy and still difficult to control.  Marland Kitchen GERD (gastroesophageal reflux disease)   . Heart murmur   . Hyperlipidemia    On daily statin  . Iron deficiency anemia    Ferritin was 12 in 2012. on FeSo4. Has had colon and EGD 02/02/11 that showed mild gastritis and colon polyps.  . Obesity    Morbid. HAs worked with [name] T previously.  . OSA (obstructive sleep apnea) 02/16/2007   02/2007 : Moderate AHI 35.6, O2 sat decreased  to 65%, CPAP titration to 16 with AHI of 3.6. 02/2014 : Rare respiratory events with sleep disturbance, within normal limits. AHI 0.4 per hour      . OSA on CPAP   . Personal history of radiation therapy    "to my left breast"  . Refusal of blood transfusions as patient is Jehovah's Witness   . Seasonal allergies    "grass pollen"    Past Surgical History:  Procedure Laterality Date  . BREAST LUMPECTOMY Left 2006  . BREAST LUMPECTOMY W/ NEEDLE LOCALIZATION  2006   34 Chemo RX  . CARDIAC CATHETERIZATION N/A 08/15/2015   Procedure: Left Heart Cath and Coronary Angiography;  Surgeon: Troy Sine, MD;  Location: [facility];  Service: Cardiovascular;  Laterality: N/A;  . COLONOSCOPY W/ BIOPSIES AND POLYPECTOMY  1994, 2000, 2012, 26/018  . LEFT HEART CATH AND CORONARY ANGIOGRAPHY N/A 08/19/2017   Procedure: LEFT HEART CATH AND CORONARY ANGIOGRAPHY;  Surgeon: Belva Crome, MD;  Location: [facility];  Service: Cardiovascular;  Laterality: N/A;  . PORTACATH PLACEMENT N/A 04/14/2018   Procedure: ATTEMPED INSERTION PORT-A-CATH;  Surgeon: Jovita Kussmaul, MD;  Location: [facility];  Service: General;  Laterality: N/A;  . TONSILLECTOMY AND ADENOIDECTOMY  1964  . TOTAL ABDOMINAL HYSTERECTOMY  1985   "I had endometrosis"  .  ULTRASOUND GUIDANCE FOR VASCULAR ACCESS  08/19/2017   Procedure: Ultrasound Guidance For Vascular Access;  Surgeon: Belva Crome, MD;  Location: [facility];  Service: Cardiovascular;;    Allergies: Lisinopril; Oxycodone-acetaminophen; Victoza [liraglutide]; and Atorvastatin  Medications: Prior to Admission medications   Medication Sig Start Date End Date Taking? Authorizing Provider  amLODipine (NORVASC) 10 MG tablet TAKE ONE (1) TABLET BY MOUTH EVERY DAY 11/25/17  Yes Bartholomew Crews, MD  aspirin EC 81 MG tablet Take 81 mg by mouth daily.   Yes [provider]  cetirizine (ZYRTEC) 10 MG tablet Take 10 mg as needed by mouth for allergies.    Yes [provider]  chlorpheniramine-HYDROcodone (TUSSIONEX PENNKINETIC ER) 10-8 MG/5ML SUER Take 5 mLs by mouth 2 (two) times daily. 03/30/18  Yes Bartholomew Crews, MD  Coenzyme Q10 (CO Q 10 PO) Take 50 mg by mouth daily.    Yes [provider]  ezetimibe (ZETIA) 10 MG tablet TAKE ONE (1) TABLET BY MOUTH EVERY DAY 03/03/18  Yes Minus Breeding, MD  HYDROcodone-acetaminophen (NORCO/VICODIN) 5-325 MG tablet Take 1-2 tablets by mouth every 6 (six) hours as needed for moderate pain or severe pain. 04/14/18  Yes Autumn [name] III, MD  Insulin Glargine (TOUJEO SOLOSTAR) 300 UNIT/ML SOPN Inject 35 Units into the skin daily. 03/30/18  Yes Bartholomew Crews, MD  isosorbide mononitrate (IMDUR) 120 MG 24 hr tablet TAKE TWO (2) TABLETS BY MOUTH EVERY MORNING 03/30/18  Yes Lendon Colonel, NP  losartan (COZAAR) 50 MG tablet TAKE ONE (1) TABLET BY MOUTH EVERY DAY 11/25/17  Yes Bartholomew Crews, MD  Lutein 6 MG TABS Take 6 mg by mouth daily.   Yes [provider]  metFORMIN (GLUCOPHAGE) 1000 MG tablet TAKE ONE (1) TABLET BY MOUTH TWO (2) TIMES DAILY WITH A MEAL 10/13/17  Yes Bartholomew Crews, MD  metoprolol succinate (TOPROL-XL) 100 MG 24 hr tablet TAKE ONE (1) TABLET BY MOUTH EVERY DAY 03/30/18  Yes Bartholomew Crews, MD  nitroGLYCERIN (NITROSTAT) 0.4 MG SL tablet PLACE 1 TABLET UNDER THE TONGUE EVERY 5 MINUTES AS NEEDED FOR CHEST PAIN 11/25/17  Yes Minus Breeding, MD  Olopatadine HCl (PATADAY) 0.2 % SOLN Place 1 drop into both eyes daily as needed. For allergies   Yes [provider]  repaglinide (PRANDIN) 2 MG tablet Take 2 tablets (4 mg total) by mouth daily with breakfast AND 2 tablets (4 mg total) daily with lunch AND 2 tablets (4 mg total) daily with supper. 02/21/18  Yes Bartholomew Crews, MD  spironolactone (ALDACTONE) 25 MG tablet Take 1 tablet (25 mg total) by mouth daily. 03/30/18  Yes Bartholomew Crews, MD  Turmeric Curcumin 500 MG CAPS Take 500 mg  by mouth daily.   Yes [provider]  ACCU-CHEK FASTCLIX LANCETS MISC Check blood sugar 3 times a day 10/11/17   Bartholomew Crews, MD  dexamethasone (DECADRON) 4 MG tablet Take 1 tablet (4 mg total) by mouth daily. Take 1 tablet day before chemo and 1 tablet day after chemo 04/11/18   Nicholas Lose, MD  glucose blood (ACCU-CHEK SMARTVIEW) test strip Check blood sugar 3 times a day 10/11/17   Bartholomew Crews, MD  Insulin Pen Needle (UNIFINE PENTIPS) 31G X 5 MM MISC USE FOR INJECTIONS TWICE DAILY. Diagnosis code E11.40, Z79.4 12/20/17   Bartholomew Crews, MD  isosorbide mononitrate (IMDUR) 120 MG 24 hr tablet Take 2 tablets (240 mg total) by mouth every morning. Patient  taking differently: Take 240 mg by mouth daily.  08/29/17 08/29/18  Lendon Colonel, NP  lidocaine-prilocaine (EMLA) cream Apply to affected area once 04/11/18   Nicholas Lose, MD  meloxicam (MOBIC) 7.5 MG tablet Take 1 tablet (7.5 mg total) by mouth daily. 12/22/17 12/22/18  Lorella Nimrod, MD  nitroGLYCERIN (NITRODUR - DOSED IN MG/24 HR) 0.4 mg/hr patch APPLY ONE PATCH ON SKIN EVERY DAY. LEAVE ON FOR 12 HOURS AND OFF FOR 12 HOURS. Patient not taking: Reported on 03/17/2018 11/05/16   Bartholomew Crews, MD  ondansetron (ZOFRAN) 8 MG tablet Take 1 tablet (8 mg total) by mouth 2 (two) times daily as needed for refractory nausea / vomiting. Start on day 3 after chemo. 04/11/18   Nicholas Lose, MD  OVER THE COUNTER MEDICATION Take 1 tablet by mouth daily. SUPER VITAMIN B-COMPLEX (Vitamin B6 and B12)    [provider]  prochlorperazine (COMPAZINE) 10 MG tablet Take 1 tablet (10 mg total) by mouth every 6 (six) hours as needed (Nausea or vomiting). 04/11/18   Nicholas Lose, MD     Family History  Problem Relation Age of Onset  . Lung cancer Mother   . Diabetes Father   . Breast cancer Sister   . Pulmonary embolism Brother   . Depression Daughter 47  . Osteoarthritis Maternal Grandmother   . Heart attack Brother 30   . Kidney failure Brother        died December 18, 2015 2/2 kidney and heart failure  . Colon cancer Neg Hx   . Colon polyps Neg Hx   . Esophageal cancer Neg Hx   . Rectal cancer Neg Hx   . Stomach cancer Neg Hx     Social History   Socioeconomic History  . Marital status: Single    Spouse name: Not on file  . Number of children: Not on file  . Years of education: 22  . Highest education level: Not on file  Occupational History  . Not on file  Social Needs  . Financial resource strain: Not on file  . Food insecurity:    Worry: Not on file    Inability: Not on file  . Transportation needs:    Medical: Not on file    Non-medical: Not on file  Tobacco Use  . Smoking status: Never Smoker  . Smokeless tobacco: Never Used  Substance and Sexual Activity  . Alcohol use: No  . Drug use: No  . Sexual activity: Never  Lifestyle  . Physical activity:    Days per week: Not on file    Minutes per session: Not on file  . Stress: Not at all  Relationships  . Social connections:    Talks on phone: Not on file    Gets together: Not on file    Attends religious service: Not on file    Active member of club or organization: Not on file    Attends meetings of clubs or organizations: Not on file    Relationship status: Not on file  Other Topics Concern  . Not on file  Social History Narrative   Worked at [employer] part-time from [phone], 2009-2011. No longer working 2013-12-17.Associates degree in early childhood education. Single, lives by self.    Review of Systems: A 12 point ROS discussed and pertinent positives are indicated in the HPI above.  All other systems are negative.  Review of Systems  Constitutional: Negative for activity change, fatigue and unexpected weight change.  Respiratory: Negative for shortness  of breath.   Gastrointestinal: Negative for abdominal pain.  Neurological: Negative for weakness.  Psychiatric/Behavioral: Negative for behavioral problems and confusion.     Vital Signs: BP 134/69   Pulse 64   Temp 98 F (36.7 C) (Oral)   Ht _0  (1.549 m)   Wt 210 lb (95.3 kg)   LMP 07/12/1984   SpO2 100%   BMI 39.68 kg/m   Physical Exam  Constitutional: She is oriented to person, place, and time.  Cardiovascular: Normal rate, regular rhythm and normal heart sounds.  Pulmonary/Chest: Effort normal and breath sounds normal.  Abdominal: Soft. Bowel sounds are normal.  Musculoskeletal: Normal range of motion.  Neurological: She is alert and oriented to person, place, and time.  Skin: Skin is warm and dry.  Left breast red; swollen Tender   Psychiatric: She has a normal mood and affect. Her behavior is normal. Judgment and thought content normal.  Nursing note and vitals reviewed.   Imaging: Dg Chest Port 1 View  Result Date: 04/14/2018 CLINICAL DATA:  Port-A-Cath attempt EXAM: PORTABLE CHEST 1 VIEW COMPARISON:  August 18, 2017 FINDINGS: Stable cardiomegaly. The hila and mediastinum are unremarkable. No pulmonary nodules or masses. No suspicious infiltrates. No pneumothorax after Port-A-Cath attempt. IMPRESSION: No pneumothorax after Port-A-Cath attempt.  No other change. Electronically Signed   By: Dorise Bullion [name] M.D   On: 04/14/2018 08:56   [place] Lt Breast Bx W Loc Dev 1st Lesion Img Bx Spec US Guide  Addendum Date: 03/23/2018   ADDENDUM REPORT: 03/23/2018 12:05 ADDENDUM: The patient was not able to make her Nutrition follow up appointment today where [name] also planned to discuss the pathology results with the patient in person. [facility]. I discussed the pathology results with the patient by telephone. The patient reported doing well after the biopsy with tenderness at the site. Post biopsy instructions and care were reviewed and questions were answered. The patient was encouraged to call [facility] for any additional concerns. Surgical consultation  has been arranged with Holly. Autumn [name], per patient request, at [name] on March 30, 2018. The patient agrees to the date and time of the appointment. Reported by Holly Messier, RN Electronically Signed   By: [name] M.D.   On: 03/23/2018 12:05   Addendum Date: 03/22/2018   ADDENDUM REPORT: 03/22/2018 12:19 ADDENDUM: Pathology revealed GRADE [name] - INVASIVE DUCTAL CARCINOMA of LEFT breast, 12:00 o'clock. This was found to be concordant by Holly. Curlene [name]. Pathology revealed GRADE [name] - INVASIVE DUCTAL CARCINOMA of LEFT breast, 7 o'clock . This was found to be concordant by Holly. Curlene [name]. Pathology results will be discussed with the patient in person by Holly. Larey [name] of [place], [place], per her request. Pathology and imaging reports were faxed to [name]. Any consultations for treatment will be placed by [name], per her request. Skin punch biopsy of the medial left breast may also be considered if found important for the clinical decision making, per previous diagnostic report. Pathology results reported by Holly Messier, RN on 03/22/2018. Electronically Signed   By: [name] M.D.   On: 03/22/2018 12:19   Result Date: 03/23/2018 CLINICAL DATA:  67 year old patient presents for 2 left breast biopsies. This report describes the biopsy of the 7 o'clock position of the mass. The patient's breast is quite tender. As it was difficult for her to  tolerate anesthesia from the medial aspect of the lower inner quadrant of the left breast, I approached the 7 o'clock position mass using a lateral approach, which was more tolerable for the patient. EXAM: ULTRASOUND GUIDED LEFT BREAST CORE NEEDLE BIOPSY COMPARISON:  Previous exam(s). FINDINGS: I met with the patient and we discussed the procedure of ultrasound-guided biopsy, including benefits and alternatives. We discussed the high likelihood of a successful procedure. We discussed  the risks of the procedure, including infection, bleeding, tissue injury, clip migration, and inadequate sampling. Informed written consent was given. The usual time-out protocol was performed immediately prior to the procedure. Lesion quadrant: Lower inner quadrant Using sterile technique and 1% Lidocaine as local anesthetic, under direct ultrasound visualization, a 14 gauge spring-loaded device was used to perform biopsy of a mass in the 7 o'clock position of the left breast using a lateral approach. Despite understanding the reasoning behind placing biopsy clips, the patient declined, and asked me not to place biopsy marker clips. IMPRESSION: Ultrasound guided biopsy of the left breast. No apparent complications. Electronically Signed: By: [name] M.D. On: 03/21/2018 17:14   [place] Lt Breast Bx W Loc Dev Ea Add Lesion Img Bx Spec US Guide  Addendum Date: 03/23/2018   ADDENDUM REPORT: 03/23/2018 12:05 ADDENDUM: The patient was not able to make her Nutrition follow up appointment today where Holly. Larey [name] also planned to discuss the pathology results with the patient in person. [facility]. I discussed the pathology results with the patient by telephone. The patient reported doing well after the biopsy with tenderness at the site. Post biopsy instructions and care were reviewed and questions were answered. The patient was encouraged to call [facility] for any additional concerns. Surgical consultation has been arranged with Holly. Autumn [name], per patient request, at [facility] on March 30, 2018. The patient agrees to the date and time of the appointment. Reported by Holly Messier, RN Electronically Signed   By: [name] M.D.   On: 03/23/2018 12:05   Addendum Date: 03/22/2018   ADDENDUM REPORT: 03/22/2018 12:20 ADDENDUM: Pathology revealed GRADE [name] - INVASIVE DUCTAL CARCINOMA of LEFT breast, 12:00 o'clock. This  was found to be concordant by Holly. Curlene [name]. Pathology revealed GRADE [name] - INVASIVE DUCTAL CARCINOMA of LEFT breast, 7 o'clock . This was found to be concordant by Holly. Curlene [name]. Pathology results will be discussed with the patient in person by Holly. Larey [name] of [place], [place], per her request. Pathology and imaging reports were faxed to [name]. Any consultations for treatment will be placed by [name], per her request. Skin punch biopsy of the medial left breast may also be considered if found important for the clinical decision making, per previous diagnostic report. Pathology results reported by Holly Messier, RN on 03/22/2018. Electronically Signed   By: [name] M.D.   On: 03/22/2018 12:20   Result Date: 03/23/2018 CLINICAL DATA:  Ultrasound-guided core needle biopsies were recommended of masses in the 12 o'clock position of the left breast and in the 7 o'clock position of the left breast. The patient declines biopsy clip placement. She understood the reasons for biopsy clips, but still declined having them placed. EXAM: ULTRASOUND GUIDED LEFT BREAST CORE NEEDLE BIOPSY COMPARISON:  Previous exam(s). FINDINGS: I met with the patient and we discussed the procedure of ultrasound-guided biopsy, including benefits and alternatives. We discussed the high  likelihood of a successful procedure. We discussed the risks of the procedure, including infection, bleeding, tissue injury, clip migration, and inadequate sampling. Informed written consent was given. The usual time-out protocol was performed immediately prior to the procedure. Lesion quadrant: Upper outer quadrant Using sterile technique and 1% Lidocaine as local anesthetic, under direct ultrasound visualization, a 14 gauge spring-loaded device was used to perform biopsy of an irregular hypoechoic mass in the 12 o'clock region of the left breast using a medial approach. The patient  declined having biopsy clips placed at the biopsy sites today. IMPRESSION: Ultrasound guided biopsy of the left breast, 12 o'clock region. No apparent complications. Electronically Signed: By: [name] M.D. On: 03/21/2018 17:10    Labs:  CBC: Recent Labs    12/22/17 1102 03/16/18 2146 04/14/18 0610 04/17/18 0815  WBC 7.1 7.5 8.3 7.3  HGB 12.1 11.7* 12.3 11.4*  HCT 36.4 37.0 38.2 36.3  PLT 259 232 278 234    COAGS: Recent Labs    08/19/17 0334 04/17/18 0815  INR 0.99 0.96  APTT  --  30    BMP: Recent Labs    09/07/17 0856 12/22/17 1102 03/16/18 2146 04/14/18 0610  NA 139 143 142 140  K 4.5 4.5 3.8 4.1  CL 103 104 105 106  CO2 _0 GLUCOSE 142* 127* 126* 161*  BUN _1 CALCIUM 10.0 10.1 9.7 10.1  CREATININE 0.93 1.01* 0.75 0.86  GFRNONAA 64 58* >60 >60  GFRAA 74 67 >60 >60    LIVER FUNCTION TESTS: Recent Labs    08/18/17 1610 12/22/17 1102  BILITOT 0.3 <0.2  AST 25 18  ALT 22 16  ALKPHOS 81 69  PROT 7.2 7.6  ALBUMIN 3.7 4.4    TUMOR MARKERS: No results for input(s): AFPTM, CEA, CA199, CHROMGRNA in the last 8760 hours.  Assessment and Plan:  Hx Left breast Ca 2006 New left breast cancer 03/2018 Scheduled for Port a cath placement for chemo therapy (attempted by Holly Marlou Starks 04/14/18-- unsuccessful) Risks and benefits of image guided port-a-catheter placement was discussed with the patient including, but not limited to bleeding, infection, pneumothorax, or fibrin sheath development and need for additional procedures.  All of the patient's questions were answered, patient is agreeable to proceed. Consent signed and in chart.  Thank you for this interesting consult.  I greatly enjoyed meeting RONELLE MICHIE and look forward to participating in their care.  A copy of this report was sent to the requesting provider on this date.  Electronically Signed: Lavonia Drafts, PA-C 04/17/2018, 9:16 AM   I spent a total of  30 Minutes   in  face to face in clinical consultation, greater than 50% of which was counseling/coordinating care for [facility] placement

## 2018-04-17 NOTE — Progress Notes (Signed)
FMLA successfully faxed to [place] at [phone]. Mailed copy to patient address on file.

## 2018-04-17 NOTE — Discharge Instructions (Signed)
Implanted Port Insertion, Care After °This sheet gives you information about how to care for yourself after your procedure. Your health care provider may also give you more specific instructions. If you have problems or questions, contact your health care provider. °What can I expect after the procedure? °After your procedure, it is common to have: °· Discomfort at the port insertion site. °· Bruising on the skin over the port. This should improve over 3-4 days. ° °Follow these instructions at home: °Port care °· After your port is placed, you will get a manufacturer's information card. The card has information about your port. Keep this card with you at all times. °· Take care of the port as told by your health care provider. Ask your health care provider if you or a family member can get training for taking care of the port at home. A home health care nurse may also take care of the port. °· Make sure to remember what type of port you have. °Incision care °· Follow instructions from your health care provider about how to take care of your port insertion site. Make sure you: °? Wash your hands with soap and water before you change your bandage (dressing). If soap and water are not available, use hand sanitizer. °? Change your dressing as told by your health care provider. °? Leave stitches (sutures), skin glue, or adhesive strips in place. These skin closures may need to stay in place for 2 weeks or longer. If adhesive strip edges start to loosen and curl up, you may trim the loose edges. Do not remove adhesive strips completely unless your health care provider tells you to do that. °· Check your port insertion site every day for signs of infection. Check for: °? More redness, swelling, or pain. °? More fluid or blood. °? Warmth. °? Pus or a bad smell. °General instructions °· Do not take baths, swim, or use a hot tub until your health care provider approves. °· Do not lift anything that is heavier than 10 lb (4.5  kg) for a week, or as told by your health care provider. °· Ask your health care provider when it is okay to: °? Return to work or school. °? Resume usual physical activities or sports. °· Do not drive for 24 hours if you were given a medicine to help you relax (sedative). °· Take over-the-counter and prescription medicines only as told by your health care provider. °· Wear a medical alert bracelet in case of an emergency. This will tell any health care providers that you have a port. °· Keep all follow-up visits as told by your health care provider. This is important. °Contact a health care provider if: °· You cannot flush your port with saline as directed, or you cannot draw blood from the port. °· You have a fever or chills. °· You have more redness, swelling, or pain around your port insertion site. °· You have more fluid or blood coming from your port insertion site. °· Your port insertion site feels warm to the touch. °· You have pus or a bad smell coming from the port insertion site. °Get help right away if: °· You have chest pain or shortness of breath. °· You have bleeding from your port that you cannot control. °Summary °· Take care of the port as told by your health care provider. °· Change your dressing as told by your health care provider. °· Keep all follow-up visits as told by your health care provider. °  This information is not intended to replace advice given to you by your health care provider. Make sure you discuss any questions you have with your health care provider. °Document Released: 07/11/2013 Document Revised: 08/11/2016 Document Reviewed: 08/11/2016 °Elsevier Interactive Patient Education © 2017 [employer] ° °

## 2018-04-17 NOTE — Progress Notes (Signed)
Per Dr.Gudena, okay to proceed chemo treatment with labs CBC from today and BMP from Friday 04/14/18.

## 2018-04-18 ENCOUNTER — Telehealth: Payer: Self-pay

## 2018-04-18 ENCOUNTER — Encounter: Payer: Self-pay | Admitting: Hematology and Oncology

## 2018-04-18 ENCOUNTER — Inpatient Hospital Stay: Payer: Medicare Other

## 2018-04-18 ENCOUNTER — Telehealth: Payer: Self-pay | Admitting: Hematology and Oncology

## 2018-04-18 VITALS — BP 154/58 | HR 69 | Temp 97.8°F | Resp 18 | Wt 212.8 lb

## 2018-04-18 DIAGNOSIS — Z170 Estrogen receptor positive status [ER+]: Secondary | ICD-10-CM | POA: Diagnosis not present

## 2018-04-18 DIAGNOSIS — Z5189 Encounter for other specified aftercare: Secondary | ICD-10-CM | POA: Diagnosis not present

## 2018-04-18 DIAGNOSIS — C50312 Malignant neoplasm of lower-inner quadrant of left female breast: Secondary | ICD-10-CM

## 2018-04-18 DIAGNOSIS — Z452 Encounter for adjustment and management of vascular access device: Secondary | ICD-10-CM | POA: Diagnosis not present

## 2018-04-18 DIAGNOSIS — C50812 Malignant neoplasm of overlapping sites of left female breast: Secondary | ICD-10-CM | POA: Diagnosis not present

## 2018-04-18 DIAGNOSIS — Z5111 Encounter for antineoplastic chemotherapy: Secondary | ICD-10-CM | POA: Diagnosis not present

## 2018-04-18 DIAGNOSIS — R51 Headache: Secondary | ICD-10-CM | POA: Diagnosis not present

## 2018-04-18 LAB — GLUCOSE, CAPILLARY: GLUCOSE-CAPILLARY: 276 mg/dL — AB (ref 70–99)

## 2018-04-18 MED ORDER — PEGFILGRASTIM 6 MG/0.6ML ~~LOC~~ PSKT
6.0000 mg | PREFILLED_SYRINGE | Freq: Once | SUBCUTANEOUS | Status: AC
  Administered 2018-04-18: 6 mg via SUBCUTANEOUS

## 2018-04-18 MED ORDER — CYCLOPHOSPHAMIDE CHEMO INJECTION 1 GM
600.0000 mg/m2 | Freq: Once | INTRAMUSCULAR | Status: AC
  Administered 2018-04-18: 1240 mg via INTRAVENOUS
  Filled 2018-04-18: qty 62

## 2018-04-18 MED ORDER — PALONOSETRON HCL INJECTION 0.25 MG/5ML
INTRAVENOUS | Status: AC
  Filled 2018-04-18: qty 5

## 2018-04-18 MED ORDER — PEGFILGRASTIM 6 MG/0.6ML ~~LOC~~ PSKT
PREFILLED_SYRINGE | SUBCUTANEOUS | Status: AC
Start: 2018-04-18 — End: ?
  Filled 2018-04-18: qty 0.6

## 2018-04-18 MED ORDER — SODIUM CHLORIDE 0.9 % IV SOLN
Freq: Once | INTRAVENOUS | Status: AC
  Administered 2018-04-18: 09:00:00 via INTRAVENOUS
  Filled 2018-04-18: qty 5

## 2018-04-18 MED ORDER — HEPARIN SOD (PORK) LOCK FLUSH 100 UNIT/ML IV SOLN
500.0000 [IU] | Freq: Once | INTRAVENOUS | Status: AC | PRN
  Administered 2018-04-18: 500 [IU]
  Filled 2018-04-18: qty 5

## 2018-04-18 MED ORDER — PALONOSETRON HCL INJECTION 0.25 MG/5ML
0.2500 mg | Freq: Once | INTRAVENOUS | Status: AC
  Administered 2018-04-18: 0.25 mg via INTRAVENOUS

## 2018-04-18 MED ORDER — SODIUM CHLORIDE 0.9 % IV SOLN
Freq: Once | INTRAVENOUS | Status: AC
  Administered 2018-04-18: 08:00:00 via INTRAVENOUS

## 2018-04-18 MED ORDER — SODIUM CHLORIDE 0.9% FLUSH
10.0000 mL | INTRAVENOUS | Status: DC | PRN
  Administered 2018-04-18: 10 mL
  Filled 2018-04-18: qty 10

## 2018-04-18 MED ORDER — SODIUM CHLORIDE 0.9 % IV SOLN
75.0000 mg/m2 | Freq: Once | INTRAVENOUS | Status: AC
  Administered 2018-04-18: 150 mg via INTRAVENOUS
  Filled 2018-04-18: qty 15

## 2018-04-18 NOTE — Patient Instructions (Signed)
[facility] Discharge Instructions for Patients Receiving Chemotherapy  Today you received the following chemotherapy agents Cytoxan, Taxotere  To help prevent nausea and vomiting after your treatment, we encourage you to take your nausea medication as directed   If you develop nausea and vomiting that is not controlled by your nausea medication, call the clinic.   BELOW ARE SYMPTOMS THAT SHOULD BE REPORTED IMMEDIATELY:  *FEVER GREATER THAN 100.5 F  *CHILLS WITH OR WITHOUT FEVER  NAUSEA AND VOMITING THAT IS NOT CONTROLLED WITH YOUR NAUSEA MEDICATION  *UNUSUAL SHORTNESS OF BREATH  *UNUSUAL BRUISING OR BLEEDING  TENDERNESS IN MOUTH AND THROAT WITH OR WITHOUT PRESENCE OF ULCERS  *URINARY PROBLEMS  *BOWEL PROBLEMS  UNUSUAL RASH Items with * indicate a potential emergency and should be followed up as soon as possible.  Feel free to call the clinic should you have any questions or concerns. The clinic phone number is [phone].  Please show the [facility] at check-in to the Emergency Department and triage nurse.  Docetaxel injection What is this medicine? DOCETAXEL (doe se TAX el) is a chemotherapy drug. It targets fast dividing cells, like cancer cells, and causes these cells to die. This medicine is used to treat many types of cancers like breast cancer, certain stomach cancers, head and neck cancer, lung cancer, and prostate cancer. This medicine may be used for other purposes; ask your health care provider or pharmacist if you have questions. COMMON BRAND NAME(S): Docefrez, Taxotere What should I tell my health care provider before I take this medicine? They need to know if you have any of these conditions: -infection (especially a virus infection such as chickenpox, cold sores, or herpes) -liver disease -low blood counts, like low white cell, platelet, or red cell counts -an unusual or allergic reaction to docetaxel, polysorbate 80, other  chemotherapy agents, other medicines, foods, dyes, or preservatives -pregnant or trying to get pregnant -breast-feeding How should I use this medicine? This drug is given as an infusion into a vein. It is administered in a hospital or clinic by a specially trained health care professional. Talk to your pediatrician regarding the use of this medicine in children. Special care may be needed. Overdosage: If you think you have taken too much of this medicine contact a poison control center or emergency room at once. NOTE: This medicine is only for you. Do not share this medicine with others. What if I miss a dose? It is important not to miss your dose. Call your doctor or health care professional if you are unable to keep an appointment. What may interact with this medicine? -cyclosporine -erythromycin -ketoconazole -medicines to increase blood counts like filgrastim, pegfilgrastim, sargramostim -vaccines Talk to your doctor or health care professional before taking any of these medicines: -acetaminophen -aspirin -ibuprofen -ketoprofen -naproxen This list may not describe all possible interactions. Give your health care provider a list of all the medicines, herbs, non-prescription drugs, or dietary supplements you use. Also tell them if you smoke, drink alcohol, or use illegal drugs. Some items may interact with your medicine. What should I watch for while using this medicine? Your condition will be monitored carefully while you are receiving this medicine. You will need important blood work done while you are taking this medicine. This drug may make you feel generally unwell. This is not uncommon, as chemotherapy can affect healthy cells as well as cancer cells. Report any side effects. Continue your course of treatment even though you feel ill  unless your doctor tells you to stop. In some cases, you may be given additional medicines to help with side effects. Follow all directions for their  use. Call your doctor or health care professional for advice if you get a fever, chills or sore throat, or other symptoms of a cold or flu. Do not treat yourself. This drug decreases your body's ability to fight infections. Try to avoid being around people who are sick. This medicine may increase your risk to bruise or bleed. Call your doctor or health care professional if you notice any unusual bleeding. This medicine may contain alcohol in the product. You may get drowsy or dizzy. Do not drive, use machinery, or do anything that needs mental alertness until you know how this medicine affects you. Do not stand or sit up quickly, especially if you are an older patient. This reduces the risk of dizzy or fainting spells. Avoid alcoholic drinks. Do not become pregnant while taking this medicine. Women should inform their doctor if they wish to become pregnant or think they might be pregnant. There is a potential for serious side effects to an unborn child. Talk to your health care professional or pharmacist for more information. Do not breast-feed an infant while taking this medicine. What side effects may I notice from receiving this medicine? Side effects that you should report to your doctor or health care professional as soon as possible: -allergic reactions like skin rash, itching or hives, swelling of the face, lips, or tongue -low blood counts - This drug may decrease the number of white blood cells, red blood cells and platelets. You may be at increased risk for infections and bleeding. -signs of infection - fever or chills, cough, sore throat, pain or difficulty passing urine -signs of decreased platelets or bleeding - bruising, pinpoint red spots on the skin, black, tarry stools, nosebleeds -signs of decreased red blood cells - unusually weak or tired, fainting spells, lightheadedness -breathing problems -fast or irregular heartbeat -low blood pressure -mouth sores -nausea and vomiting -pain,  swelling, redness or irritation at the injection site -pain, tingling, numbness in the hands or feet -swelling of the ankle, feet, hands -weight gain Side effects that usually do not require medical attention (report to your doctor or health care professional if they continue or are bothersome): -bone pain -complete hair loss including hair on your head, underarms, pubic hair, eyebrows, and eyelashes -diarrhea -excessive tearing -changes in the color of fingernails -loosening of the fingernails -nausea -muscle pain -red flush to skin -sweating -weak or tired This list may not describe all possible side effects. Call your doctor for medical advice about side effects. You may report side effects to FDA at 1-800-FDA-1088. Where should I keep my medicine? This drug is given in a hospital or clinic and will not be stored at home. NOTE: This sheet is a summary. It may not cover all possible information. If you have questions about this medicine, talk to your doctor, pharmacist, or health care provider.  2018 Elsevier/Gold Standard (2015-10-23 12:32:56)  Cyclophosphamide injection What is this medicine? CYCLOPHOSPHAMIDE (sye kloe FOSS fa mide) is a chemotherapy drug. It slows the growth of cancer cells. This medicine is used to treat many types of cancer like lymphoma, myeloma, leukemia, breast cancer, and ovarian cancer, to name a few. This medicine may be used for other purposes; ask your health care provider or pharmacist if you have questions. COMMON BRAND NAME(S): Cytoxan, Neosar What should I tell my health care provider  before I take this medicine? They need to know if you have any of these conditions: -blood disorders -history of other chemotherapy -infection -kidney disease -liver disease -recent or ongoing radiation therapy -tumors in the bone marrow -an unusual or allergic reaction to cyclophosphamide, other chemotherapy, other medicines, foods, dyes, or  preservatives -pregnant or trying to get pregnant -breast-feeding How should I use this medicine? This drug is usually given as an injection into a vein or muscle or by infusion into a vein. It is administered in a hospital or clinic by a specially trained health care professional. Talk to your pediatrician regarding the use of this medicine in children. Special care may be needed. Overdosage: If you think you have taken too much of this medicine contact a poison control center or emergency room at once. NOTE: This medicine is only for you. Do not share this medicine with others. What if I miss a dose? It is important not to miss your dose. Call your doctor or health care professional if you are unable to keep an appointment. What may interact with this medicine? This medicine may interact with the following medications: -amiodarone -amphotericin B -azathioprine -certain antiviral medicines for HIV or AIDS such as protease inhibitors (e.g., indinavir, ritonavir) and zidovudine -certain blood pressure medications such as benazepril, captopril, enalapril, fosinopril, lisinopril, moexipril, monopril, perindopril, quinapril, ramipril, trandolapril -certain cancer medications such as anthracyclines (e.g., daunorubicin, doxorubicin), busulfan, cytarabine, paclitaxel, pentostatin, tamoxifen, trastuzumab -certain diuretics such as chlorothiazide, chlorthalidone, hydrochlorothiazide, indapamide, metolazone -certain medicines that treat or prevent blood clots like warfarin -certain muscle relaxants such as succinylcholine -cyclosporine -etanercept -indomethacin -medicines to increase blood counts like filgrastim, pegfilgrastim, sargramostim -medicines used as general anesthesia -metronidazole -natalizumab This list may not describe all possible interactions. Give your health care provider a list of all the medicines, herbs, non-prescription drugs, or dietary supplements you use. Also tell them if  you smoke, drink alcohol, or use illegal drugs. Some items may interact with your medicine. What should I watch for while using this medicine? Visit your doctor for checks on your progress. This drug may make you feel generally unwell. This is not uncommon, as chemotherapy can affect healthy cells as well as cancer cells. Report any side effects. Continue your course of treatment even though you feel ill unless your doctor tells you to stop. Drink water or other fluids as directed. Urinate often, even at night. In some cases, you may be given additional medicines to help with side effects. Follow all directions for their use. Call your doctor or health care professional for advice if you get a fever, chills or sore throat, or other symptoms of a cold or flu. Do not treat yourself. This drug decreases your body's ability to fight infections. Try to avoid being around people who are sick. This medicine may increase your risk to bruise or bleed. Call your doctor or health care professional if you notice any unusual bleeding. Be careful brushing and flossing your teeth or using a toothpick because you may get an infection or bleed more easily. If you have any dental work done, tell your dentist you are receiving this medicine. You may get drowsy or dizzy. Do not drive, use machinery, or do anything that needs mental alertness until you know how this medicine affects you. Do not become pregnant while taking this medicine or for 1 year after stopping it. Women should inform their doctor if they wish to become pregnant or think they might be pregnant. Men should not  father a child while taking this medicine and for 4 months after stopping it. There is a potential for serious side effects to an unborn child. Talk to your health care professional or pharmacist for more information. Do not breast-feed an infant while taking this medicine. This medicine may interfere with the ability to have a child. This medicine  has caused ovarian failure in some women. This medicine has caused reduced sperm counts in some men. You should talk with your doctor or health care professional if you are concerned about your fertility. If you are going to have surgery, tell your doctor or health care professional that you have taken this medicine. What side effects may I notice from receiving this medicine? Side effects that you should report to your doctor or health care professional as soon as possible: -allergic reactions like skin rash, itching or hives, swelling of the face, lips, or tongue -low blood counts - this medicine may decrease the number of white blood cells, red blood cells and platelets. You may be at increased risk for infections and bleeding. -signs of infection - fever or chills, cough, sore throat, pain or difficulty passing urine -signs of decreased platelets or bleeding - bruising, pinpoint red spots on the skin, black, tarry stools, blood in the urine -signs of decreased red blood cells - unusually weak or tired, fainting spells, lightheadedness -breathing problems -dark urine -dizziness -palpitations -swelling of the ankles, feet, hands -trouble passing urine or change in the amount of urine -weight gain -yellowing of the eyes or skin Side effects that usually do not require medical attention (report to your doctor or health care professional if they continue or are bothersome): -changes in nail or skin color -hair loss -missed menstrual periods -mouth sores -nausea, vomiting This list may not describe all possible side effects. Call your doctor for medical advice about side effects. You may report side effects to FDA at 1-800-FDA-1088. Where should I keep my medicine? This drug is given in a hospital or clinic and will not be stored at home. NOTE: This sheet is a summary. It may not cover all possible information. If you have questions about this medicine, talk to your doctor, pharmacist, or  health care provider.  2018 Elsevier/Gold Standard (2012-08-04 16:22:58)

## 2018-04-18 NOTE — Progress Notes (Signed)
Patient/family members came by to see me per my request. Gave my card for patient to call. Will discuss opportunity to apply for [employer]. Patient has 2 insurances therefore no copay assistance should be needed. She has my card for any additional financial questions or concerns. She verbalized understanding.

## 2018-04-18 NOTE — Progress Notes (Signed)
Discharge instructions printed and verbally reviewed. Patient and daughter verbalized understanding. Denies questions or concerns on exit.

## 2018-04-18 NOTE — Telephone Encounter (Signed)
Attempted to call patient to get her to come back for her On-Pro.  She did not answer so I went to the lobby and found her getting ready to leave.  I brought her back in a wheelchair to infusion.  Selinda Orion took care of her following. Gardiner Rhyme

## 2018-04-18 NOTE — Telephone Encounter (Signed)
Gave patient avs and calendar of upcoming July appts per 7/15 sch message.  Patient scheduled with Lacie due to VG and LC being out of office.

## 2018-04-20 ENCOUNTER — Telehealth: Payer: Self-pay | Admitting: Adult Health

## 2018-04-20 ENCOUNTER — Ambulatory Visit (HOSPITAL_COMMUNITY)
Admission: RE | Admit: 2018-04-20 | Discharge: 2018-04-20 | Disposition: A | Discharge status: Home / Self Care | Payer: Medicare Other | Source: Ambulatory Visit | Attending: Hematology and Oncology | Admitting: Hematology and Oncology

## 2018-04-20 DIAGNOSIS — R599 Enlarged lymph nodes, unspecified: Secondary | ICD-10-CM | POA: Diagnosis not present

## 2018-04-20 DIAGNOSIS — Z170 Estrogen receptor positive status [ER+]: Secondary | ICD-10-CM | POA: Insufficient documentation

## 2018-04-20 DIAGNOSIS — Z853 Personal history of malignant neoplasm of breast: Secondary | ICD-10-CM | POA: Diagnosis not present

## 2018-04-20 DIAGNOSIS — C50312 Malignant neoplasm of lower-inner quadrant of left female breast: Secondary | ICD-10-CM | POA: Diagnosis not present

## 2018-04-20 MED ORDER — IOPAMIDOL (ISOVUE-300) INJECTION 61%
INTRAVENOUS | Status: AC
  Filled 2018-04-20: qty 100

## 2018-04-20 MED ORDER — IOPAMIDOL (ISOVUE-300) INJECTION 61%
100.0000 mL | Freq: Once | INTRAVENOUS | Status: AC | PRN
  Administered 2018-04-20: 100 mL via INTRAVENOUS

## 2018-04-20 NOTE — Telephone Encounter (Signed)
Called patient mobile preferred number first to review CT results.  Spoke with her daughter who requested I call her home phone.  Called patients home phone and spoke again with her daughter who informed me the patient was sleeping. I requested that she have Coralyn return our call once she wakes up.    Wilber Bihari, NP

## 2018-04-25 ENCOUNTER — Inpatient Hospital Stay (HOSPITAL_BASED_OUTPATIENT_CLINIC_OR_DEPARTMENT_OTHER): Payer: Medicare Other | Admitting: Nurse Practitioner

## 2018-04-25 ENCOUNTER — Inpatient Hospital Stay: Payer: Medicare Other

## 2018-04-25 ENCOUNTER — Telehealth: Payer: Self-pay | Admitting: Internal Medicine

## 2018-04-25 ENCOUNTER — Encounter: Payer: Self-pay | Admitting: Hematology and Oncology

## 2018-04-25 ENCOUNTER — Encounter: Payer: Self-pay | Admitting: Nurse Practitioner

## 2018-04-25 VITALS — BP 121/57 | HR 82 | Temp 98.2°F | Resp 18 | Ht 61.0 in | Wt 209.9 lb

## 2018-04-25 DIAGNOSIS — M898X9 Other specified disorders of bone, unspecified site: Secondary | ICD-10-CM

## 2018-04-25 DIAGNOSIS — Z5111 Encounter for antineoplastic chemotherapy: Secondary | ICD-10-CM

## 2018-04-25 DIAGNOSIS — Z170 Estrogen receptor positive status [ER+]: Principal | ICD-10-CM

## 2018-04-25 DIAGNOSIS — C50312 Malignant neoplasm of lower-inner quadrant of left female breast: Secondary | ICD-10-CM

## 2018-04-25 DIAGNOSIS — Z95828 Presence of other vascular implants and grafts: Secondary | ICD-10-CM | POA: Insufficient documentation

## 2018-04-25 DIAGNOSIS — R51 Headache: Secondary | ICD-10-CM

## 2018-04-25 DIAGNOSIS — Z5189 Encounter for other specified aftercare: Secondary | ICD-10-CM | POA: Diagnosis not present

## 2018-04-25 DIAGNOSIS — Z452 Encounter for adjustment and management of vascular access device: Secondary | ICD-10-CM | POA: Diagnosis not present

## 2018-04-25 DIAGNOSIS — C50812 Malignant neoplasm of overlapping sites of left female breast: Secondary | ICD-10-CM | POA: Diagnosis not present

## 2018-04-25 DIAGNOSIS — D493 Neoplasm of unspecified behavior of breast: Secondary | ICD-10-CM

## 2018-04-25 LAB — CMP (CANCER CENTER ONLY)
ALT: 17 U/L (ref 0–44)
ANION GAP: 9 (ref 5–15)
AST: 27 U/L (ref 15–41)
Albumin: 3.6 g/dL (ref 3.5–5.0)
Alkaline Phosphatase: 95 U/L (ref 38–126)
BUN: 12 mg/dL (ref 8–23)
CHLORIDE: 108 mmol/L (ref 98–111)
CO2: 25 mmol/L (ref 22–32)
CREATININE: 0.97 mg/dL (ref 0.44–1.00)
Calcium: 9.7 mg/dL (ref 8.9–10.3)
GFR, EST NON AFRICAN AMERICAN: 59 mL/min — AB (ref >60)
Glucose, Bld: 167 mg/dL — ABNORMAL HIGH (ref 70–99)
POTASSIUM: 4.4 mmol/L (ref 3.5–5.1)
SODIUM: 142 mmol/L (ref 135–145)
Total Bilirubin: 0.2 mg/dL — ABNORMAL LOW (ref 0.3–1.2)
Total Protein: 7.1 g/dL (ref 6.5–8.1)

## 2018-04-25 LAB — CBC WITH DIFFERENTIAL (CANCER CENTER ONLY)
Basophils Absolute: 0.1 10*3/uL (ref 0.0–0.1)
Basophils Relative: 0 %
EOS ABS: 0.1 10*3/uL (ref 0.0–0.5)
Eosinophils Relative: 0 %
HCT: 34.3 % — ABNORMAL LOW (ref 34.8–46.6)
HEMOGLOBIN: 11.2 g/dL — AB (ref 11.6–15.9)
LYMPHS ABS: 3.4 10*3/uL — AB (ref 0.9–3.3)
LYMPHS PCT: 14 %
MCH: 29.2 pg (ref 25.1–34.0)
MCHC: 32.5 g/dL (ref 31.5–36.0)
MCV: 89.7 fL (ref 79.5–101.0)
MONOS PCT: 4 %
Monocytes Absolute: 0.9 10*3/uL (ref 0.1–0.9)
NEUTROS ABS: 19.9 10*3/uL — AB (ref 1.5–6.5)
NEUTROS PCT: 82 %
Platelet Count: 161 10*3/uL (ref 145–400)
RBC: 3.82 MIL/uL (ref 3.70–5.45)
RDW: 15.1 % — ABNORMAL HIGH (ref 11.2–14.5)
WBC: 24.3 10*3/uL — AB (ref 3.9–10.3)

## 2018-04-25 MED ORDER — HEPARIN SOD (PORK) LOCK FLUSH 100 UNIT/ML IV SOLN
500.0000 [IU] | Freq: Once | INTRAVENOUS | Status: AC
  Administered 2018-04-25: 500 [IU]
  Filled 2018-04-25: qty 5

## 2018-04-25 MED ORDER — TRAMADOL HCL 50 MG PO TABS: 50.0000 mg | ORAL_TABLET | Freq: Two times a day (BID) | ORAL | 0 refills | Status: DC | PRN

## 2018-04-25 MED ORDER — SODIUM CHLORIDE 0.9% FLUSH
10.0000 mL | Freq: Once | INTRAVENOUS | Status: AC
  Administered 2018-04-25: 10 mL
  Filled 2018-04-25: qty 10

## 2018-04-25 NOTE — Progress Notes (Signed)
Patient's daughter Reuel Boom called with patient present to schedule appointment. Advised her patient may apply for one-time $1000 grant to help with personal expenses while going through treatment. Patient expressed wanting to apply. Asked for verbal income and household size. Based on verbal income and household of one, patient does qualify. Advised proof of income would be needed to finalize. Asked if they could bring at patient's next visit here on 05/09/18 at 9:45. They verbalized understanding and will bring here on 05/09/18. They have my card for any additional financial questions or concerns.

## 2018-04-25 NOTE — Telephone Encounter (Signed)
At 0730 this am 143 At 0845 this am 167 States cancer md is worried that cbg is getting higher Needs to make changes due to chemo Pt states if you need to speak w/ her please call this pm tomorrow she will be at cancer ctr

## 2018-04-25 NOTE — Progress Notes (Signed)
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 S/p lumpectomy and radiation.      03/21/2018 Initial Diagnosis    [place] skin anterior left breast: Ultrasound 7 o'clock position 2 cm from nipple irregular hypoechoic mass 2.2 x 2.5 x 2.5 cm (1 cm medial to prior lumpectomy); 12:00: Hypoechoic mass extending to skin 3.9 x 2.9 x 3.6 cm market skin thickening, no axillary lymph nodes      03/21/2018 Initial Biopsy    Left breast 12 o'clock position: IDC grade 3; 7 o'clock position: IDC grade 3, ER 40%, PR 0%, Ki-67 80%, HER-2 equivocal by FISH ratio 1.17 copy #4.4, IHC 1+ negative, T4d N0 stage IIIc clinical stage       04/11/2018 -  Chemotherapy    The patient had palonosetron (ALOXI) injection 0.25 mg, 0.25 mg, Intravenous,  Once, 1 of 4 cycles Administration: 0.25 mg (04/18/2018) pegfilgrastim (NEULASTA ONPRO KIT) injection 6 mg, 6 mg, Subcutaneous, Once, 1 of 4 cycles Administration: 6 mg (04/18/2018) pegfilgrastim-cbqv (UDENYCA) injection 6 mg, 6 mg, Subcutaneous, Once, 1 of 4 cycles cyclophosphamide (CYTOXAN) 1,240 mg in sodium chloride 0.9 % 250 mL chemo infusion, 600 mg/m2 = 1,240 mg, Intravenous,  Once, 1 of 4 cycles Administration: 1,240 mg (04/18/2018) DOCEtaxel (TAXOTERE) 150 mg in sodium chloride 0.9 % 250 mL chemo infusion, 75 mg/m2 = 150 mg, Intravenous,  Once, 1 of 4 cycles Administration: 150 mg (04/18/2018) fosaprepitant (EMEND) 150 mg,  dexamethasone (DECADRON) 4 mg in sodium chloride 0.9 % 145 mL IVPB, , Intravenous,  Once, 1 of 4 cycles Administration:  (04/18/2018)  for chemotherapy treatment.       04/20/2018 Imaging    IMPRESSION: 1. Multiple masses within the left breast compatible with recently diagnosed left breast carcinoma. 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 2. Cortically thickened right axillary lymph node, indeterminate. Metastatic disease not excluded. 3. No evidence for additional sites of disease within the chest, abdomen or pelvis.     CURRENT THERAPY: neoadjuvant taxotere/cytoxan with Onpro q3 weeks x4 cycles, started 04/18/18   INTERVAL HISTORY: Ms. Rajewski returns for nadir check follow-up with her daughters as scheduled.  She completed cycle 1 Taxotere/Cytoxan with on pro-on 04/18/2018.  She developed "excruciating" bone pain to lower back and legs after Neulasta injection.  She had bone and back pain previous to chemotherapy that has worsened after on pro.  Claritin, Mobic and Tylenol are not helpful.  She did not fill Norco due to previous reaction to oxycodone.  She has not been able to find comfortable position or sleep well due to bone and body discomfort.  She has mild headache.  She has left breast pain.  She reports this week has been "rough" with fatigue.  She has not been able to exercise as long or as frequently as her baseline.  Appetite is decreasing but she continues to drink well.  Had been taking Compazine every 6 since chemotherapy, which has adequately controlled her intermittent nausea.  She took 1 dose   of Zofran on day 3.  Early this morning (day 7) she developed emesis x3.  She feels weak from vomiting episode.  She has temperature fluctuation but denies fever, cough, chest pain, dyspnea, mucositis, or neuropathy.  REVIEW OF SYSTEMS:   Constitutional: Denies fevers, chills or (+) weight fluctuation (+) decreased appetite (+) temperature  fluctuation Eyes: Denies blurriness of vision Ears, nose, mouth, throat, and face: Denies mucositis or sore throat Respiratory: Denies cough, dyspnea or wheezes Cardiovascular: Denies palpitation, chest discomfort or lower extremity swelling Gastrointestinal:  Denies constipation, diarrhea, heartburn or change in bowel habits (+) intermittent nausea, controlled with compazine (+) emesis x3 on day 7 Skin: Denies abnormal skin rashes Lymphatics: Denies new lymphadenopathy or easy bruising Neurological:Denies numbness, tingling or new weaknesses Behavioral/Psych: Mood is stable, no new changes  MSK: (+) low back and upepr leg pain, worsened after neulasta (+) ambulates with cane  Breast: (+) intermittent left breast pain (+) skin areas "drying" out  All other systems were reviewed with the patient and are negative.  MEDICAL HISTORY:  Past Medical History:  Diagnosis Date  . Arthritis    "knees, lower back" (08/18/2017)  . 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 S/p lumpectomy and radiation.  Marland Kitchen CAD (coronary artery disease) 11/07   cath 11/11 :  3V CADsee report   . Cataract    small both eyes  . Chronic lower back pain   . Chronic pain    MR 08/07/10 :  Spondylosis L4-5 with B foraminal narrowing and L4 nerve root enchroachment B.  . Chronic venous insufficiency 2012   Has had full W/U incl ECHO, LFT's, Cr, Umicro, TSH, BNP. Symptomatic treatment.  . Colonic polyp 1995   Colonoscopies 1994, 2000, and 02/02/2011. Last had 9 polyps - Tubular adenoma and tubulovillous was the pathology results without high grade dysplasia  . Diabetes mellitus type II    Insulin dependent. Has worked with [name] R previously.  . Diastolic CHF, chronic ([place])    NL EF by echo 01/2012, Gr I dd  . Essential hypertension    Requires 5 drug therapy and still difficult to control.  Marland Kitchen GERD (gastroesophageal reflux disease)   . Heart murmur   . Hyperlipidemia     On daily statin  . Iron deficiency anemia    Ferritin was 12 in 2012. on FeSo4. Has had colon and EGD 02/02/11 that showed mild gastritis and colon polyps.  . Obesity    Morbid. HAs worked with [name] T previously.  . OSA (obstructive sleep apnea) 02/16/2007   02/2007 : Moderate AHI 35.6, O2 sat decreased to 65%, CPAP titration to 16 with AHI of 3.6. 02/2014 : Rare respiratory events with sleep disturbance, within normal limits. AHI 0.4 per hour      . OSA on CPAP   . Personal history of radiation therapy    "to my left breast"  . Refusal of blood transfusions as patient is Jehovah's Witness   . Seasonal allergies    "grass pollen"    SURGICAL HISTORY: Past Surgical History:  Procedure Laterality Date  . BREAST LUMPECTOMY Left 2006  . BREAST LUMPECTOMY W/ NEEDLE LOCALIZATION  2006   34 Chemo RX  . CARDIAC CATHETERIZATION N/A 08/15/2015   Procedure: Left Heart Cath and Coronary Angiography;  Surgeon: Troy Sine, MD;  Location: [facility];  Service: Cardiovascular;  Laterality: N/A;  . COLONOSCOPY W/ BIOPSIES AND POLYPECTOMY  1994, 2000,  2012, 26/018  . IR IMAGING GUIDED PORT INSERTION  04/17/2018  . LEFT HEART CATH AND CORONARY ANGIOGRAPHY N/A 08/19/2017   Procedure: LEFT HEART CATH AND CORONARY ANGIOGRAPHY;  Surgeon: Belva Crome, MD;  Location: [facility];  Service: Cardiovascular;  Laterality: N/A;  . PORTACATH PLACEMENT N/A 04/14/2018   Procedure: ATTEMPED INSERTION PORT-A-CATH;  Surgeon: Jovita Kussmaul, MD;  Location: [facility];  Service: General;  Laterality: N/A;  . TONSILLECTOMY AND ADENOIDECTOMY  1964  . TOTAL ABDOMINAL HYSTERECTOMY  1985   "I had endometrosis"  . ULTRASOUND GUIDANCE FOR VASCULAR ACCESS  08/19/2017   Procedure: Ultrasound Guidance For Vascular Access;  Surgeon: Belva Crome, MD;  Location: [facility];  Service: Cardiovascular;;    I have reviewed the social history and family history with the patient and they are unchanged from  previous note.  ALLERGIES:  is allergic to lisinopril; oxycodone-acetaminophen; victoza [liraglutide]; and atorvastatin.  MEDICATIONS:  Current Outpatient Medications  Medication Sig Dispense Refill  . ACCU-CHEK FASTCLIX LANCETS MISC Check blood sugar 3 times a day 102 each 5  . amLODipine (NORVASC) 10 MG tablet TAKE ONE (1) TABLET BY MOUTH EVERY DAY 90 tablet 3  . aspirin EC 81 MG tablet Take 81 mg by mouth daily.    . cetirizine (ZYRTEC) 10 MG tablet Take 10 mg as needed by mouth for allergies.    . chlorpheniramine-HYDROcodone (TUSSIONEX PENNKINETIC ER) 10-8 MG/5ML SUER Take 5 mLs by mouth 2 (two) times daily. 140 mL 0  . Coenzyme Q10 (CO Q 10 PO) Take 50 mg by mouth daily.     Marland Kitchen dexamethasone (DECADRON) 4 MG tablet Take 1 tablet (4 mg total) by mouth daily. Take 1 tablet day before chemo and 1 tablet day after chemo 10 tablet 0  . ezetimibe (ZETIA) 10 MG tablet TAKE ONE (1) TABLET BY MOUTH EVERY DAY 30 tablet 7  . glucose blood (ACCU-CHEK SMARTVIEW) test strip Check blood sugar 3 times a day 100 each 5  . Insulin Glargine (TOUJEO SOLOSTAR) 300 UNIT/ML SOPN Inject 35 Units into the skin daily. 12 mL 3  . Insulin Pen Needle (UNIFINE PENTIPS) 31G X 5 MM MISC USE FOR INJECTIONS TWICE DAILY. Diagnosis code E11.40, Z79.4 100 each 11  . isosorbide mononitrate (IMDUR) 120 MG 24 hr tablet Take 2 tablets (240 mg total) by mouth every morning. (Patient taking differently: Take 240 mg by mouth daily. ) 30 tablet 11  . isosorbide mononitrate (IMDUR) 120 MG 24 hr tablet TAKE TWO (2) TABLETS BY MOUTH EVERY MORNING 60 tablet 6  . lidocaine-prilocaine (EMLA) cream Apply to affected area once 30 g 3  . losartan (COZAAR) 50 MG tablet TAKE ONE (1) TABLET BY MOUTH EVERY DAY 90 tablet 3  . Lutein 6 MG TABS Take 6 mg by mouth daily.    . meloxicam (MOBIC) 7.5 MG tablet Take 1 tablet (7.5 mg total) by mouth daily. 30 tablet 2  . metFORMIN (GLUCOPHAGE) 1000 MG tablet TAKE ONE (1) TABLET BY MOUTH TWO (2) TIMES  DAILY WITH A MEAL 180 tablet 3  . metoprolol succinate (TOPROL-XL) 100 MG 24 hr tablet TAKE ONE (1) TABLET BY MOUTH EVERY DAY 90 tablet 3  . Olopatadine HCl (PATADAY) 0.2 % SOLN Place 1 drop into both eyes daily as needed. For allergies    . ondansetron (ZOFRAN) 8 MG tablet Take 1 tablet (8 mg total) by mouth 2 (two) times daily as needed for refractory nausea / vomiting. Start on  day 3 after chemo. 30 tablet 1  . OVER THE COUNTER MEDICATION Take 1 tablet by mouth daily. SUPER VITAMIN B-COMPLEX (Vitamin B6 and B12)    . prochlorperazine (COMPAZINE) 10 MG tablet Take 1 tablet (10 mg total) by mouth every 6 (six) hours as needed (Nausea or vomiting). 30 tablet 1  . repaglinide (PRANDIN) 2 MG tablet Take 2 tablets (4 mg total) by mouth daily with breakfast AND 2 tablets (4 mg total) daily with lunch AND 2 tablets (4 mg total) daily with supper. 270 tablet 3  . spironolactone (ALDACTONE) 25 MG tablet Take 1 tablet (25 mg total) by mouth daily. 90 tablet 3  . Turmeric Curcumin 500 MG CAPS Take 500 mg by mouth daily.    . nitroGLYCERIN (NITRODUR - DOSED IN MG/24 HR) 0.4 mg/hr patch APPLY ONE PATCH ON SKIN EVERY DAY. LEAVE ON FOR 12 HOURS AND OFF FOR 12 HOURS. (Patient not taking: Reported on 04/25/2018) 100 patch 3  . nitroGLYCERIN (NITROSTAT) 0.4 MG SL tablet PLACE 1 TABLET UNDER THE TONGUE EVERY 5 MINUTES AS NEEDED FOR CHEST PAIN (Patient not taking: Reported on 04/25/2018) 25 tablet 7  . traMADol (ULTRAM) 50 MG tablet Take 1 tablet (50 mg total) by mouth every 12 (twelve) hours as needed. 30 tablet 0   No current facility-administered medications for this visit.     PHYSICAL EXAMINATION: ECOG PERFORMANCE STATUS: 2 - Symptomatic, <50% confined to bed  Vitals:   04/25/18 0908  BP: (!) 121/57  Pulse: 82  Resp: 18  Temp: 98.2 F (36.8 C)  SpO2: 100%   Filed Weights   04/25/18 0908  Weight: 209 lb 14.4 oz (95.2 kg)    GENERAL:alert, no distress and comfortable SKIN: no rashes or significant  lesions EYES: normal, Conjunctiva are pink and non-injected, sclera clear OROPHARYNX: no thrush or ulcers   LYMPH:  no palpable cervical or supraclavicular lymphadenopathy LUNGS: clear to auscultation with normal breathing effort HEART: regular rate & rhythm and no murmurs and no lower extremity edema ABDOMEN:abdomen soft, non-tender and normal bowel sounds Musculoskeletal:no cyanosis of digits and no clubbing  NEURO: alert & oriented x 3 with fluent speech, no focal motor/sensory deficits Breast exam: no mass in right breast or axilla that I could palpate. Left breast with areolar retraction, skin thickening and edema involving the central left breast, skin involvement in the inner left quadrants, no open wounds or discharge. No palpable adenopathy in the left axilla. PAC without erythema   LABORATORY DATA:  I have reviewed the data as listed CBC Latest Ref Rng & Units 04/25/2018 04/17/2018 04/14/2018  WBC 3.9 - 10.3 K/uL 24.3(H) 7.3 8.3  Hemoglobin 11.6 - 15.9 g/dL 11.2(L) 11.4(L) 12.3  Hematocrit 34.8 - 46.6 % 34.3(L) 36.3 38.2  Platelets 145 - 400 K/uL 161 234 278     CMP Latest Ref Rng & Units 04/25/2018 04/14/2018 03/16/2018  Glucose 70 - 99 mg/dL 167(H) 161(H) 126(H)  BUN 8 - 23 mg/dL 12 20 9  Creatinine 0.44 - 1.00 mg/dL 0.97 0.86 0.75  Sodium 135 - 145 mmol/L 142 140 142  Potassium 3.5 - 5.1 mmol/L 4.4 4.1 3.8  Chloride 98 - 111 mmol/L 108 106 105  CO2 22 - 32 mmol/L 25 25 26  Calcium 8.9 - 10.3 mg/dL 9.7 10.1 9.7  Total Protein 6.5 - 8.1 g/dL 7.1 - -  Total Bilirubin 0.3 - 1.2 mg/dL 0.2(L) - -  Alkaline Phos 38 - 126 U/L 95 - -  AST 15 -   41 U/L 27 - -  ALT 0 - 44 U/L 17 - -      RADIOGRAPHIC STUDIES: I have personally reviewed the radiological images as listed and agreed with the findings in the report. No results found.   ASSESSMENT & PLAN:  Malignant neoplasm of lower-inner quadrant of left breast in female, estrogen receptor positive (HCC) Ms. Jaime appears  stable. She completed 1 cycle neoadjuvant TC with Onpro; she tolerated fairly well overall with delayed n/v and bone pain secondary to neulasta. She received emend, decadron, and aloxi pre-meds. She had not been maximizing zofran, she will add and alternate with compazine PRN for optimal relief. If she has persistent n/v I encouraged her to call [facility] for possible IVF and IV anti-emetics. For severe bone pain, I prescribed tramadol PRN.   VSS. She has lost some weight since beginning chemotherapy but overall stable from 1 month ago. Given her DM, I recommend glucerna for nutrition supplement to prevent further weight loss during treatment. She agrees. I encouraged her to stay adequately hydrated. Labs reviewed. She has leukocytosis secondary to neulasta WBC 24.3/ANC 19.9, Hgb is stable at 11.2, platelets trending down but still in normal range.   I reviewed her staging CT CAP, which shows no definite evidence of metastasis in the chest, abdomen, or pelvis. There is cortical thickening in the right axillary lung node which is indeterminate, but metastasis cannot be ruled out. Appears she did not have imaging of the right breast during initial work up. I discussed with Dr. Feng, who recommends bilateral breast MRI w/wo contrast to further evaluate the right breast and axilla. She does not have pacemaker or obvious contraindication to MRI. I ordered today. She will undergo bone scan 7/24 to rule out osseous metastasis. Will f/u with results.   PLAN: -Labs reviewed  -MR breast bilateral w/wo contrast asap to evaluate right breast and axilla, ordered today -Bone scan pending, 7/24 -Rx: tramadol for bone pain PRN -Patient to utilize zofran and alternate with compazine PRN for n/v -Return for lab, f/u with Dr. Gudena in 2 weeks with cycle 2 TC.   All questions were answered. The patient knows to call the clinic with any problems, questions or concerns. No barriers to learning was detected. I spent 20 minutes  counseling the patient face to face. The total time spent in the appointment was 25 minutes and more than 50% was on counseling and review of test results     Lacie K Burton, NP 04/25/18    

## 2018-04-25 NOTE — Telephone Encounter (Signed)
Pt called in regards to her Diabetic Insulin Medications.  Pt having high readings since she started her Chemotherapy Treatments.  Pt seen today by the [facility] and states she was asked to call her PCP as soon as possible.  Pt not feeling well and patient wants to change her insulin dosage.

## 2018-04-26 ENCOUNTER — Ambulatory Visit (HOSPITAL_COMMUNITY)
Admission: RE | Admit: 2018-04-26 | Discharge: 2018-04-26 | Disposition: A | Discharge status: Home / Self Care | Payer: Medicare Other | Source: Ambulatory Visit | Attending: Hematology and Oncology | Admitting: Hematology and Oncology

## 2018-04-26 ENCOUNTER — Telehealth: Payer: Self-pay

## 2018-04-26 ENCOUNTER — Telehealth: Payer: Self-pay | Admitting: Hematology

## 2018-04-26 ENCOUNTER — Encounter (HOSPITAL_COMMUNITY)
Admission: RE | Admit: 2018-04-26 | Discharge: 2018-04-26 | Disposition: A | Discharge status: Home / Self Care | Payer: Medicare Other | Source: Ambulatory Visit | Attending: Hematology and Oncology | Admitting: Hematology and Oncology

## 2018-04-26 DIAGNOSIS — Z5111 Encounter for antineoplastic chemotherapy: Secondary | ICD-10-CM | POA: Diagnosis not present

## 2018-04-26 DIAGNOSIS — Z170 Estrogen receptor positive status [ER+]: Secondary | ICD-10-CM | POA: Diagnosis not present

## 2018-04-26 DIAGNOSIS — C50312 Malignant neoplasm of lower-inner quadrant of left female breast: Secondary | ICD-10-CM | POA: Insufficient documentation

## 2018-04-26 DIAGNOSIS — C50919 Malignant neoplasm of unspecified site of unspecified female breast: Secondary | ICD-10-CM | POA: Diagnosis not present

## 2018-04-26 MED ORDER — TECHNETIUM TC 99M MEDRONATE IV KIT
19.7000 | PACK | Freq: Once | INTRAVENOUS | Status: AC | PRN
  Administered 2018-04-26: 19.7 via INTRAVENOUS

## 2018-04-26 NOTE — Telephone Encounter (Signed)
No LOS 7/23 °

## 2018-04-26 NOTE — Telephone Encounter (Signed)
-----   Message from Smith Mince, RN sent at 04/18/2018  2:17 PM EDT ----- Regarding: [name]: First time Chemo First time Taxotere, Cytoxan. Patient tolerated well.

## 2018-04-26 NOTE — Telephone Encounter (Signed)
Called patient to follow up with 1st treatment on 04/18/2018. No answer, left message for patient to call back if any questions or concerns.  Contact number left.

## 2018-04-27 NOTE — Telephone Encounter (Signed)
[name] Would you pls call her to get sense of diet and CBG's and insulin adjustment.

## 2018-04-27 NOTE — Telephone Encounter (Addendum)
Spoke with Holly. [name]. She took chemo last week and the plan is for chemo every 3 weeks for 4 months. She takes steroids daily in the am on chemo days. She is taking Toujeo 35 units in the am, metformin and Prandin 4 mg with each meal 3 times daily. She says she feels poorly when her blood sugars are high. She is aware the steroids are likely causing her blood sugars to increase. She is not eating well at times and her blood sugar is high.  Her blood sugars have been:  Date   AM  /PM 04/19/18 early am 330/ 131/235   7/18 206/189 7/19- 207/276 7/20     181- 249- both in am 2 hours apart 7/21  114/296 7/22 163 7/23 149/242/96 7/24 151/247 7/25 134  [name] wants to increase the Toujeo by 2 units. I  suggested that a rapid acting correction insulin scale might be helpful for when her blood sugar is more than 200 mg/dl. She is in agreement with this plan. I told her it would need to be approved by Dr. Lynnae January. Suggest the following correction scale added to her current regimen:  Check blood sugar before each meal. < 200 do not take Novolog 201- 250 inject  2 unit Novolog 251- 300 inject   3 units Novolog 301 - 350  inject  4 units Novolog 351 - 400 inject   5 units Novolog 401 - 450 inject   6 units Novolog 451 - 500  inject  7 units Novolog  > 500 inject  8 units Novolog and call office, if office is closed call after hours resident for instructions.

## 2018-04-28 ENCOUNTER — Encounter (HOSPITAL_COMMUNITY): Payer: Self-pay

## 2018-04-28 ENCOUNTER — Ambulatory Visit (HOSPITAL_COMMUNITY)
Admission: RE | Admit: 2018-04-28 | Discharge: 2018-04-28 | Disposition: A | Discharge status: Home / Self Care | Payer: Medicare Other | Source: Ambulatory Visit | Attending: Nurse Practitioner | Admitting: Nurse Practitioner

## 2018-04-28 DIAGNOSIS — D493 Neoplasm of unspecified behavior of breast: Secondary | ICD-10-CM

## 2018-04-28 MED ORDER — GADOBENATE DIMEGLUMINE 529 MG/ML IV SOLN: 20.0000 mL | Freq: Once | INTRAVENOUS | Status: DC | PRN

## 2018-05-01 ENCOUNTER — Other Ambulatory Visit: Payer: Self-pay | Admitting: Nurse Practitioner

## 2018-05-02 ENCOUNTER — Other Ambulatory Visit: Payer: Self-pay | Admitting: Nurse Practitioner

## 2018-05-02 DIAGNOSIS — D493 Neoplasm of unspecified behavior of breast: Secondary | ICD-10-CM

## 2018-05-03 ENCOUNTER — Other Ambulatory Visit: Payer: Self-pay | Admitting: Nurse Practitioner

## 2018-05-03 DIAGNOSIS — D493 Neoplasm of unspecified behavior of breast: Secondary | ICD-10-CM

## 2018-05-03 NOTE — Telephone Encounter (Signed)
Yes pls

## 2018-05-04 ENCOUNTER — Encounter: Payer: Self-pay | Admitting: Internal Medicine

## 2018-05-04 ENCOUNTER — Other Ambulatory Visit: Payer: Self-pay | Admitting: Dietician

## 2018-05-04 DIAGNOSIS — E1140 Type 2 diabetes mellitus with diabetic neuropathy, unspecified: Secondary | ICD-10-CM

## 2018-05-04 DIAGNOSIS — Z794 Long term (current) use of insulin: Principal | ICD-10-CM

## 2018-05-04 MED ORDER — INSULIN ASPART 100 UNIT/ML FLEXPEN: PEN_INJECTOR | SUBCUTANEOUS | 1 refills | Status: DC

## 2018-05-04 MED ORDER — INSULIN PEN NEEDLE 32G X 4 MM MISC: 5 refills | Status: DC

## 2018-05-04 NOTE — Telephone Encounter (Signed)
Called [name] and she is in agreement with adding a Novolog fast acting insulin before meals when blood sugar is > 200 mg/dl. She verbalized understanding and is expecting [place] to send a prescription.[facility] for pen needles and [place] for [employer] Blood sugars up and down with her appetite: She says it was 98 yesterday and she felt funny. This morning 258 fasting.  Request prescription and pen needles

## 2018-05-05 ENCOUNTER — Ambulatory Visit
Admission: RE | Admit: 2018-05-05 | Discharge: 2018-05-05 | Disposition: A | Discharge status: Home / Self Care | Payer: Medicare Other | Source: Ambulatory Visit | Attending: Nurse Practitioner | Admitting: Nurse Practitioner

## 2018-05-05 ENCOUNTER — Other Ambulatory Visit: Payer: Self-pay | Admitting: *Deleted

## 2018-05-05 DIAGNOSIS — R928 Other abnormal and inconclusive findings on diagnostic imaging of breast: Secondary | ICD-10-CM | POA: Diagnosis not present

## 2018-05-05 DIAGNOSIS — D493 Neoplasm of unspecified behavior of breast: Secondary | ICD-10-CM

## 2018-05-05 DIAGNOSIS — Z794 Long term (current) use of insulin: Principal | ICD-10-CM

## 2018-05-05 DIAGNOSIS — E1140 Type 2 diabetes mellitus with diabetic neuropathy, unspecified: Secondary | ICD-10-CM

## 2018-05-05 DIAGNOSIS — N6489 Other specified disorders of breast: Secondary | ICD-10-CM | POA: Diagnosis not present

## 2018-05-05 HISTORY — DX: Personal history of antineoplastic chemotherapy: Z92.21

## 2018-05-05 MED ORDER — INSULIN ASPART 100 UNIT/ML FLEXPEN: PEN_INJECTOR | SUBCUTANEOUS | 1 refills | Status: DC

## 2018-05-07 ENCOUNTER — Other Ambulatory Visit: Payer: Self-pay | Admitting: Oncology

## 2018-05-09 ENCOUNTER — Inpatient Hospital Stay: Payer: Medicare Other

## 2018-05-09 ENCOUNTER — Other Ambulatory Visit: Payer: Self-pay | Admitting: *Deleted

## 2018-05-09 ENCOUNTER — Other Ambulatory Visit: Payer: Self-pay | Admitting: Hematology and Oncology

## 2018-05-09 ENCOUNTER — Inpatient Hospital Stay (HOSPITAL_BASED_OUTPATIENT_CLINIC_OR_DEPARTMENT_OTHER): Payer: Medicare Other | Admitting: Hematology and Oncology

## 2018-05-09 ENCOUNTER — Inpatient Hospital Stay: Payer: Medicare Other | Attending: Hematology and Oncology

## 2018-05-09 ENCOUNTER — Encounter: Payer: Self-pay | Admitting: *Deleted

## 2018-05-09 ENCOUNTER — Encounter: Payer: Self-pay | Admitting: Hematology and Oncology

## 2018-05-09 DIAGNOSIS — Z5189 Encounter for other specified aftercare: Secondary | ICD-10-CM | POA: Insufficient documentation

## 2018-05-09 DIAGNOSIS — K219 Gastro-esophageal reflux disease without esophagitis: Secondary | ICD-10-CM | POA: Diagnosis not present

## 2018-05-09 DIAGNOSIS — R0602 Shortness of breath: Secondary | ICD-10-CM | POA: Insufficient documentation

## 2018-05-09 DIAGNOSIS — Z170 Estrogen receptor positive status [ER+]: Principal | ICD-10-CM

## 2018-05-09 DIAGNOSIS — D6481 Anemia due to antineoplastic chemotherapy: Secondary | ICD-10-CM | POA: Insufficient documentation

## 2018-05-09 DIAGNOSIS — Z452 Encounter for adjustment and management of vascular access device: Secondary | ICD-10-CM | POA: Insufficient documentation

## 2018-05-09 DIAGNOSIS — R2231 Localized swelling, mass and lump, right upper limb: Secondary | ICD-10-CM

## 2018-05-09 DIAGNOSIS — C50312 Malignant neoplasm of lower-inner quadrant of left female breast: Secondary | ICD-10-CM

## 2018-05-09 DIAGNOSIS — C50812 Malignant neoplasm of overlapping sites of left female breast: Secondary | ICD-10-CM | POA: Diagnosis not present

## 2018-05-09 DIAGNOSIS — E119 Type 2 diabetes mellitus without complications: Secondary | ICD-10-CM | POA: Insufficient documentation

## 2018-05-09 DIAGNOSIS — G629 Polyneuropathy, unspecified: Secondary | ICD-10-CM | POA: Diagnosis not present

## 2018-05-09 DIAGNOSIS — Z5111 Encounter for antineoplastic chemotherapy: Secondary | ICD-10-CM | POA: Diagnosis not present

## 2018-05-09 DIAGNOSIS — Z95828 Presence of other vascular implants and grafts: Secondary | ICD-10-CM

## 2018-05-09 LAB — CBC WITH DIFFERENTIAL (CANCER CENTER ONLY)
BASOS ABS: 0.1 10*3/uL (ref 0.0–0.1)
Basophils Relative: 1 %
EOS ABS: 0 10*3/uL (ref 0.0–0.5)
Eosinophils Relative: 0 %
HCT: 32.4 % — ABNORMAL LOW (ref 34.8–46.6)
HEMOGLOBIN: 10.7 g/dL — AB (ref 11.6–15.9)
LYMPHS ABS: 1.1 10*3/uL (ref 0.9–3.3)
LYMPHS PCT: 11 %
MCH: 30.3 pg (ref 25.1–34.0)
MCHC: 33.2 g/dL (ref 31.5–36.0)
MCV: 91.1 fL (ref 79.5–101.0)
Monocytes Absolute: 0.3 10*3/uL (ref 0.1–0.9)
Monocytes Relative: 3 %
NEUTROS PCT: 85 %
Neutro Abs: 9 10*3/uL — ABNORMAL HIGH (ref 1.5–6.5)
Platelet Count: 262 10*3/uL (ref 145–400)
RBC: 3.55 MIL/uL — ABNORMAL LOW (ref 3.70–5.45)
RDW: 16.4 % — ABNORMAL HIGH (ref 11.2–14.5)
WBC: 10.6 10*3/uL — AB (ref 3.9–10.3)

## 2018-05-09 LAB — CMP (CANCER CENTER ONLY)
ALBUMIN: 3.9 g/dL (ref 3.5–5.0)
ALT: 22 U/L (ref 0–44)
AST: 15 U/L (ref 15–41)
Alkaline Phosphatase: 87 U/L (ref 38–126)
Anion gap: 12 (ref 5–15)
BUN: 18 mg/dL (ref 8–23)
CHLORIDE: 104 mmol/L (ref 98–111)
CO2: 22 mmol/L (ref 22–32)
Calcium: 9.7 mg/dL (ref 8.9–10.3)
Creatinine: 0.98 mg/dL (ref 0.44–1.00)
GFR, EST NON AFRICAN AMERICAN: 58 mL/min — AB (ref >60)
GFR, Est AFR Am: >60 mL/min (ref >60)
Glucose, Bld: 307 mg/dL — ABNORMAL HIGH (ref 70–99)
POTASSIUM: 4.3 mmol/L (ref 3.5–5.1)
Sodium: 138 mmol/L (ref 135–145)
Total Bilirubin: 0.2 mg/dL — ABNORMAL LOW (ref 0.3–1.2)
Total Protein: 7.7 g/dL (ref 6.5–8.1)

## 2018-05-09 MED ORDER — SODIUM CHLORIDE 0.9% FLUSH
10.0000 mL | INTRAVENOUS | Status: DC | PRN
  Administered 2018-05-09: 10 mL
  Filled 2018-05-09: qty 10

## 2018-05-09 MED ORDER — SODIUM CHLORIDE 0.9 % IV SOLN
Freq: Once | INTRAVENOUS | Status: AC
  Administered 2018-05-09: 13:00:00 via INTRAVENOUS
  Filled 2018-05-09: qty 5

## 2018-05-09 MED ORDER — PEGFILGRASTIM 6 MG/0.6ML ~~LOC~~ PSKT
PREFILLED_SYRINGE | SUBCUTANEOUS | Status: AC
  Filled 2018-05-09: qty 0.6

## 2018-05-09 MED ORDER — PALONOSETRON HCL INJECTION 0.25 MG/5ML
0.2500 mg | Freq: Once | INTRAVENOUS | Status: AC
  Administered 2018-05-09: 0.25 mg via INTRAVENOUS

## 2018-05-09 MED ORDER — SODIUM CHLORIDE 0.9 % IV SOLN
Freq: Once | INTRAVENOUS | Status: AC
  Administered 2018-05-09: 12:00:00 via INTRAVENOUS
  Filled 2018-05-09: qty 250

## 2018-05-09 MED ORDER — SODIUM CHLORIDE 0.9% FLUSH
10.0000 mL | Freq: Once | INTRAVENOUS | Status: AC
  Administered 2018-05-09: 10 mL
  Filled 2018-05-09: qty 10

## 2018-05-09 MED ORDER — PALONOSETRON HCL INJECTION 0.25 MG/5ML
INTRAVENOUS | Status: AC
  Filled 2018-05-09: qty 5

## 2018-05-09 MED ORDER — HEPARIN SOD (PORK) LOCK FLUSH 100 UNIT/ML IV SOLN
500.0000 [IU] | Freq: Once | INTRAVENOUS | Status: AC | PRN
  Administered 2018-05-09: 500 [IU]
  Filled 2018-05-09: qty 5

## 2018-05-09 MED ORDER — CYCLOPHOSPHAMIDE CHEMO INJECTION 1 GM
600.0000 mg/m2 | Freq: Once | INTRAMUSCULAR | Status: AC
  Administered 2018-05-09: 1240 mg via INTRAVENOUS
  Filled 2018-05-09: qty 62

## 2018-05-09 MED ORDER — PEGFILGRASTIM 6 MG/0.6ML ~~LOC~~ PSKT
6.0000 mg | PREFILLED_SYRINGE | Freq: Once | SUBCUTANEOUS | Status: AC
  Administered 2018-05-09: 6 mg via SUBCUTANEOUS

## 2018-05-09 MED ORDER — PROCHLORPERAZINE MALEATE 10 MG PO TABS: 10.0000 mg | ORAL_TABLET | Freq: Four times a day (QID) | ORAL | 2 refills | Status: DC | PRN

## 2018-05-09 MED ORDER — SODIUM CHLORIDE 0.9 % IV SOLN
75.0000 mg/m2 | Freq: Once | INTRAVENOUS | Status: AC
  Administered 2018-05-09: 150 mg via INTRAVENOUS
  Filled 2018-05-09: qty 15

## 2018-05-09 NOTE — Progress Notes (Signed)
Met with patient and daughters whom brought proof of income for [employer]. Patient approved for one-time $1000 grant. Patient has a copy of the approval as well as the expense sheet along with the [facility] OP pharmacy information. Patein received a gas card from her grant. She has my card for any additional financial questions or concerns.

## 2018-05-09 NOTE — Progress Notes (Signed)
Patient Care Team: Bartholomew Crews, MD as PCP - General (Internal Medicine) Hayden Pedro, MD as Consulting Physician (Ophthalmology) Shirley Muscat Loreen Freud, MD as Referring Physician (Optometry) Minus Breeding, MD as Consulting Physician (Cardiology)  DIAGNOSIS:  Encounter Diagnosis  Name Primary?  . 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 S/p lumpectomy and radiation.      03/21/2018 Initial Diagnosis    Peau [place] Orange skin anterior left breast: Ultrasound 7 o'clock position 2 cm from nipple irregular hypoechoic mass 2.2 x 2.5 x 2.5 cm (1 cm medial to prior lumpectomy); 12:00: Hypoechoic mass extending to skin 3.9 x 2.9 x 3.6 cm market skin thickening, no axillary lymph nodes      03/21/2018 Initial Biopsy    Left breast 12 o'clock position: IDC grade 3; 7 o'clock position: IDC grade 3, ER 40%, PR 0%, Ki-67 80%, HER-2 equivocal by FISH ratio 1.17 copy #4.4, IHC 1+ negative, T4d N0 stage IIIc clinical stage       04/20/2018 Imaging    IMPRESSION: 1. Multiple masses within the left breast (inflammatory breast carcinoma.) 2. Cortically thickened right axillary lymph node, indeterminate. Metastatic disease not excluded (patient refused biopsy)      05/09/2018 -  Neo-Adjuvant Chemotherapy    Neoadjuvant chemotherapy with TC X 4       CHIEF COMPLIANT: Cycle 2 Taxotere and Cytoxan  INTERVAL HISTORY: [name] is a 67 year old with above-mentioned history of inflammatory breast cancer who is currently on Taxotere and Cytoxan today cycle 2 of her treatment.  After cycle 1 she had significant nausea and vomiting issues for which she took Compazine around-the-clock.  She also felt markedly  fatigued.  Did not have any vomiting.  REVIEW OF SYSTEMS:   Constitutional: Denies fevers, chills or abnormal weight loss Eyes: Denies blurriness of vision Ears, nose, mouth, throat, and face: Denies mucositis or sore throat Respiratory: Denies cough, dyspnea or wheezes Cardiovascular: Denies palpitation, chest discomfort Gastrointestinal:  Denies nausea, heartburn or change in bowel habits Skin: Denies abnormal skin rashes Lymphatics: Denies new lymphadenopathy or easy bruising Neurological:Denies numbness, tingling or new weaknesses Behavioral/Psych: Mood is stable, no new changes  Extremities: No lower extremity edema  All other systems were reviewed with the patient and are negative.  I have reviewed the past medical history, past surgical history, social history and family history with the patient and they are unchanged from previous note.  ALLERGIES:  is allergic to lisinopril; oxycodone-acetaminophen; victoza [liraglutide]; and atorvastatin.  MEDICATIONS:  Current Outpatient Medications  Medication Sig Dispense Refill  . ACCU-CHEK FASTCLIX LANCETS MISC Check blood sugar 3 times a day 102 each 5  . amLODipine (NORVASC) 10 MG tablet TAKE ONE (1) TABLET BY MOUTH EVERY DAY 90 tablet 3  . aspirin EC 81 MG tablet Take 81 mg by mouth daily.    . cetirizine (ZYRTEC) 10 MG tablet Take 10 mg as needed by mouth for allergies.    . chlorpheniramine-HYDROcodone (TUSSIONEX PENNKINETIC ER) 10-8 MG/5ML SUER Take 5 mLs by mouth 2 (two) times daily. 140 mL 0  . Coenzyme Q10 (CO Q 10 PO) Take 50 mg by mouth daily.     Marland Kitchen dexamethasone (DECADRON) 4 MG tablet Take 1 tablet (  4 mg total) by mouth daily. Take 1 tablet day before chemo and 1 tablet day after chemo 10 tablet 0  . ezetimibe (ZETIA) 10 MG tablet TAKE ONE (1) TABLET BY MOUTH EVERY DAY 30 tablet 7  . glucose blood (ACCU-CHEK SMARTVIEW) test strip Check blood sugar 3 times a day 100 each 5  . insulin aspart (NOVOLOG FLEXPEN) 100 UNIT/ML  FlexPen . If 201- 250 inject 2 units,251- 300 inject 3 units,301- 400  inject 4 units,> 400 inject 5 units, call office. 15 mL 1  . Insulin Glargine (TOUJEO SOLOSTAR) 300 UNIT/ML SOPN Inject 35 Units into the skin daily. 12 mL 3  . Insulin Pen Needle 32G X 4 MM MISC USE FOR INJECTIONS up to 4 times daily. Diagnosis code E11.40, Z79.4 200 each 5  . isosorbide mononitrate (IMDUR) 120 MG 24 hr tablet Take 2 tablets (240 mg total) by mouth every morning. (Patient taking differently: Take 240 mg by mouth daily. ) 30 tablet 11  . isosorbide mononitrate (IMDUR) 120 MG 24 hr tablet TAKE TWO (2) TABLETS BY MOUTH EVERY MORNING 60 tablet 6  . lidocaine-prilocaine (EMLA) cream Apply to affected area once 30 g 3  . losartan (COZAAR) 50 MG tablet TAKE ONE (1) TABLET BY MOUTH EVERY DAY 90 tablet 3  . Lutein 6 MG TABS Take 6 mg by mouth daily.    . meloxicam (MOBIC) 7.5 MG tablet Take 1 tablet (7.5 mg total) by mouth daily. 30 tablet 2  . metFORMIN (GLUCOPHAGE) 1000 MG tablet TAKE ONE (1) TABLET BY MOUTH TWO (2) TIMES DAILY WITH A MEAL 180 tablet 3  . metoprolol succinate (TOPROL-XL) 100 MG 24 hr tablet TAKE ONE (1) TABLET BY MOUTH EVERY DAY 90 tablet 3  . nitroGLYCERIN (NITRODUR - DOSED IN MG/24 HR) 0.4 mg/hr patch APPLY ONE PATCH ON SKIN EVERY DAY. LEAVE ON FOR 12 HOURS AND OFF FOR 12 HOURS. (Patient not taking: Reported on 04/25/2018) 100 patch 3  . nitroGLYCERIN (NITROSTAT) 0.4 MG SL tablet PLACE 1 TABLET UNDER THE TONGUE EVERY 5 MINUTES AS NEEDED FOR CHEST PAIN (Patient not taking: Reported on 04/25/2018) 25 tablet 7  . Olopatadine HCl (PATADAY) 0.2 % SOLN Place 1 drop into both eyes daily as needed. For allergies    . ondansetron (ZOFRAN) 8 MG tablet Take 1 tablet (8 mg total) by mouth 2 (two) times daily as needed for refractory nausea / vomiting. Start on day 3 after chemo. 30 tablet 1  . OVER THE COUNTER MEDICATION Take 1 tablet by mouth daily. SUPER VITAMIN B-COMPLEX (Vitamin B6 and B12)    .  prochlorperazine (COMPAZINE) 10 MG tablet Take 1 tablet (10 mg total) by mouth every 6 (six) hours as needed (Nausea or vomiting). 60 tablet 2  . repaglinide (PRANDIN) 2 MG tablet Take 2 tablets (4 mg total) by mouth daily with breakfast AND 2 tablets (4 mg total) daily with lunch AND 2 tablets (4 mg total) daily with supper. 270 tablet 3  . spironolactone (ALDACTONE) 25 MG tablet Take 1 tablet (25 mg total) by mouth daily. 90 tablet 3  . traMADol (ULTRAM) 50 MG tablet Take 1 tablet (50 mg total) by mouth every 12 (twelve) hours as needed. 30 tablet 0  . Turmeric Curcumin 500 MG CAPS Take 500 mg by mouth daily.     No current facility-administered medications for this visit.    Facility-Administered Medications Ordered in Other Visits  Medication Dose Route Frequency Provider Last Rate Last Dose  . cyclophosphamide (  CYTOXAN) 1,240 mg in sodium chloride 0.9 % 250 mL chemo infusion  600 mg/m2 (Treatment Plan Recorded) Intravenous Once Nicholas Lose, MD      . DOCEtaxel (TAXOTERE) 150 mg in sodium chloride 0.9 % 250 mL chemo infusion  75 mg/m2 (Treatment Plan Recorded) Intravenous Once Nicholas Lose, MD      . fosaprepitant (EMEND) 150 mg, dexamethasone (DECADRON) 4 mg in sodium chloride 0.9 % 145 mL IVPB   Intravenous Once Nicholas Lose, MD 451 mL/hr at 05/09/18 1240    . heparin lock flush 100 unit/mL  500 Units Intracatheter Once PRN Nicholas Lose, MD      . pegfilgrastim (NEULASTA ONPRO KIT) injection 6 mg  6 mg Subcutaneous Once Nicholas Lose, MD      . sodium chloride flush (NS) 0.9 % injection 10 mL  10 mL Intracatheter PRN Nicholas Lose, MD        PHYSICAL EXAMINATION: ECOG PERFORMANCE STATUS: 1 - Symptomatic but completely ambulatory  Vitals:   05/09/18 1029  BP: 131/66  Pulse: 90  Resp: 17  Temp: 97.8 F (36.6 C)  SpO2: 100%   Filed Weights   05/09/18 1029  Weight: 216 lb (98 kg)    GENERAL:alert, no distress and comfortable SKIN: skin color, texture, turgor are normal, no  rashes or significant lesions EYES: normal, Conjunctiva are pink and non-injected, sclera clear OROPHARYNX:no exudate, no erythema and lips, buccal mucosa, and tongue normal  NECK: supple, thyroid normal size, non-tender, without nodularity LYMPH:  no palpable lymphadenopathy in the cervical, axillary or inguinal LUNGS: clear to auscultation and percussion with normal breathing effort HEART: regular rate & rhythm and no murmurs and no lower extremity edema ABDOMEN:abdomen soft, non-tender and normal bowel sounds MUSCULOSKELETAL:no cyanosis of digits and no clubbing  NEURO: alert & oriented x 3 with fluent speech, no focal motor/sensory deficits EXTREMITIES: No lower extremity edema   LABORATORY DATA:  I have reviewed the data as listed CMP Latest Ref Rng & Units 05/09/2018 04/25/2018 04/14/2018  Glucose 70 - 99 mg/dL 307(H) 167(H) 161(H)  BUN 8 - 23 mg/dL _0 Creatinine 0.44 - 1.00 mg/dL 0.98 0.97 0.86  Sodium 135 - 145 mmol/L 138 142 140  Potassium 3.5 - 5.1 mmol/L 4.3 4.4 4.1  Chloride 98 - 111 mmol/L 104 108 106  CO2 22 - 32 mmol/L _1 Calcium 8.9 - 10.3 mg/dL 9.7 9.7 10.1  Total Protein 6.5 - 8.1 g/dL 7.7 7.1 -  Total Bilirubin 0.3 - 1.2 mg/dL 0.2(L) 0.2(L) -  Alkaline Phos 38 - 126 U/L 87 95 -  AST 15 - 41 U/L 15 27 -  ALT 0 - 44 U/L 22 17 -    Lab Results  Component Value Date   WBC 10.6 (H) 05/09/2018   HGB 10.7 (L) 05/09/2018   HCT 32.4 (L) 05/09/2018   MCV 91.1 05/09/2018   PLT 262 05/09/2018   NEUTROABS 9.0 (H) 05/09/2018    ASSESSMENT & PLAN:  Malignant neoplasm of lower-inner quadrant of left breast in female, estrogen receptor positive [facility]) 2006 June : 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 S/p lumpectomy and radiation.  03/21/2018:[place] skin anterior left breast: Ultrasound 7 o'clock position 2 cm from nipple irregular hypoechoic mass 2.2 x 2.5 x 2.5 cm (1 cm medial to prior lumpectomy); 12:00: Hypoechoic mass  extending to skin 3.9 x 2.9 x 3.6 cm market skin thickening, no axillary lymph nodes  Left breast 12 o'clock  position: IDC grade 3; 7 o'clock position: IDC grade 3, ER 40%, PR 0%, Ki-67 80%, HER-2 equivocal by FISH ratio 1.17 copy #4.4, IHC 1+ negative, T4d N0 stage IIIc clinical stage  Right breast and axillary ultrasound: Prominent right axillary lymph node.  Patient refused biopsy ----------------------------------------------------------------------------- Treatment plan: 1.  Neoadjuvant chemotherapy with dose dense Adriamycin and Cytoxan x4 followed by Taxol weekly x12 2. followed by mastectomy 3.  Followed by radiation 4.  Followed by antiestrogen therapy ---------------------------------------------------------------------------- Current treatment: Cycle 2 day 1 Taxotere and Cytoxan  Labs have been reviewed Chemo toxicities: 1.  Nausea and vomiting  2. chemo induced anemia: Being monitored I discussed with the patient that she should undergo biopsy of the right axillary lymph node.  She is willing to do it now.    No orders of the defined types were placed in this encounter.  The patient has a good understanding of the overall plan. she agrees with it. she will call with any problems that may develop before the next visit here.   Harriette Ohara, MD 05/09/18

## 2018-05-09 NOTE — Progress Notes (Unsigned)
IM

## 2018-05-09 NOTE — Assessment & Plan Note (Signed)
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 S/p lumpectomy and radiation.  03/21/2018:[place] skin anterior left breast: Ultrasound 7 o'clock position 2 cm from nipple irregular hypoechoic mass 2.2 x 2.5 x 2.5 cm (1 cm medial to prior lumpectomy); 12:00: Hypoechoic mass extending to skin 3.9 x 2.9 x 3.6 cm market skin thickening, no axillary lymph nodes  Left breast 12 o'clock position: IDC grade 3; 7 o'clock position: IDC grade 3, ER 40%, PR 0%, Ki-67 80%, HER-2 equivocal by FISH ratio 1.17 copy #4.4, IHC 1+ negative, T4d N0 stage IIIc clinical stage  Right breast and axillary ultrasound: Prominent right axillary lymph node.  Patient refused biopsy ----------------------------------------------------------------------------- Treatment plan: 1.  Neoadjuvant chemotherapy with dose dense Adriamycin and Cytoxan x4 followed by Taxol weekly x12 2. followed by mastectomy 3.  Followed by radiation 4.  Followed by antiestrogen therapy ---------------------------------------------------------------------------- Current treatment: Cycle 1 day 1 dose dense Adriamycin and Cytoxan  Labs have been reviewed Chemo consent obtained Antiemetics were reviewed Return to clinic in 1 week for toxicity check

## 2018-05-09 NOTE — Patient Instructions (Signed)
[facility] Discharge Instructions for Patients Receiving Chemotherapy  Today you received the following chemotherapy agents taxotere/cytoxan.   To help prevent nausea and vomiting after your treatment, we encourage you to take your nausea medication as directed.    If you develop nausea and vomiting that is not controlled by your nausea medication, call the clinic.   BELOW ARE SYMPTOMS THAT SHOULD BE REPORTED IMMEDIATELY:  *FEVER GREATER THAN 100.5 F  *CHILLS WITH OR WITHOUT FEVER  NAUSEA AND VOMITING THAT IS NOT CONTROLLED WITH YOUR NAUSEA MEDICATION  *UNUSUAL SHORTNESS OF BREATH  *UNUSUAL BRUISING OR BLEEDING  TENDERNESS IN MOUTH AND THROAT WITH OR WITHOUT PRESENCE OF ULCERS  *URINARY PROBLEMS  *BOWEL PROBLEMS  UNUSUAL RASH Items with * indicate a potential emergency and should be followed up as soon as possible.  Feel free to call the clinic you have any questions or concerns. The clinic phone number is [phone].  

## 2018-05-09 NOTE — Patient Instructions (Signed)
Implanted Port Home Guide An implanted port is a type of central line that is placed under the skin. Central lines are used to provide IV access when treatment or nutrition needs to be given through a person's veins. Implanted ports are used for long-term IV access. An implanted port may be placed because:  You need IV medicine that would be irritating to the small veins in your hands or arms.  You need long-term IV medicines, such as antibiotics.  You need IV nutrition for a long period.  You need frequent blood draws for lab tests.  You need dialysis.  Implanted ports are usually placed in the chest area, but they can also be placed in the upper arm, the abdomen, or the leg. An implanted port has two main parts:  Reservoir. The reservoir is round and will appear as a small, raised area under your skin. The reservoir is the part where a needle is inserted to give medicines or draw blood.  Catheter. The catheter is a thin, flexible tube that extends from the reservoir. The catheter is placed into a large vein. Medicine that is inserted into the reservoir goes into the catheter and then into the vein.  How will I care for my incision site? Do not get the incision site wet. Bathe or shower as directed by your health care provider. How is my port accessed? Special steps must be taken to access the port:  Before the port is accessed, a numbing cream can be placed on the skin. This helps numb the skin over the port site.  Your health care provider uses a sterile technique to access the port. ? Your health care provider must put on a mask and sterile gloves. ? The skin over your port is cleaned carefully with an antiseptic and allowed to dry. ? The port is gently pinched between sterile gloves, and a needle is inserted into the port.  Only "non-coring" port needles should be used to access the port. Once the port is accessed, a blood return should be checked. This helps ensure that the port  is in the vein and is not clogged.  If your port needs to remain accessed for a constant infusion, a clear (transparent) bandage will be placed over the needle site. The bandage and needle will need to be changed every week, or as directed by your health care provider.  Keep the bandage covering the needle clean and dry. Do not get it wet. Follow your health care provider's instructions on how to take a shower or bath while the port is accessed.  If your port does not need to stay accessed, no bandage is needed over the port.  What is flushing? Flushing helps keep the port from getting clogged. Follow your health care provider's instructions on how and when to flush the port. Ports are usually flushed with saline solution or a medicine called heparin. The need for flushing will depend on how the port is used.  If the port is used for intermittent medicines or blood draws, the port will need to be flushed: ? After medicines have been given. ? After blood has been drawn. ? As part of routine maintenance.  If a constant infusion is running, the port may not need to be flushed.  How long will my port stay implanted? The port can stay in for as long as your health care provider thinks it is needed. When it is time for the port to come out, surgery will be   done to remove it. The procedure is similar to the one performed when the port was put in. When should I seek immediate medical care? When you have an implanted port, you should seek immediate medical care if:  You notice a bad smell coming from the incision site.  You have swelling, redness, or drainage at the incision site.  You have more swelling or pain at the port site or the surrounding area.  You have a fever that is not controlled with medicine.  This information is not intended to replace advice given to you by your health care provider. Make sure you discuss any questions you have with your health care provider. Document  Released: 09/20/2005 Document Revised: 02/26/2016 Document Reviewed: 05/28/2013 Elsevier Interactive Patient Education  2017 [employer]  

## 2018-05-09 NOTE — Progress Notes (Signed)
Gave patient appointment for axillary biopsy scheduled for 05/11/18 at 130pm.  Patient verbalized understanding.

## 2018-05-11 ENCOUNTER — Encounter: Payer: Self-pay | Admitting: Internal Medicine

## 2018-05-11 ENCOUNTER — Other Ambulatory Visit: Payer: Self-pay | Admitting: Internal Medicine

## 2018-05-11 ENCOUNTER — Ambulatory Visit
Admission: RE | Admit: 2018-05-11 | Discharge: 2018-05-11 | Disposition: A | Discharge status: Home / Self Care | Payer: Medicare Other | Source: Ambulatory Visit | Attending: Hematology and Oncology | Admitting: Hematology and Oncology

## 2018-05-11 ENCOUNTER — Telehealth: Payer: Self-pay

## 2018-05-11 ENCOUNTER — Other Ambulatory Visit: Payer: Self-pay | Admitting: Hematology and Oncology

## 2018-05-11 DIAGNOSIS — Z170 Estrogen receptor positive status [ER+]: Principal | ICD-10-CM

## 2018-05-11 DIAGNOSIS — R2231 Localized swelling, mass and lump, right upper limb: Secondary | ICD-10-CM

## 2018-05-11 DIAGNOSIS — C801 Malignant (primary) neoplasm, unspecified: Secondary | ICD-10-CM | POA: Diagnosis not present

## 2018-05-11 DIAGNOSIS — C50312 Malignant neoplasm of lower-inner quadrant of left female breast: Secondary | ICD-10-CM

## 2018-05-11 DIAGNOSIS — C773 Secondary and unspecified malignant neoplasm of axilla and upper limb lymph nodes: Secondary | ICD-10-CM | POA: Diagnosis not present

## 2018-05-11 DIAGNOSIS — R590 Localized enlarged lymph nodes: Secondary | ICD-10-CM | POA: Diagnosis not present

## 2018-05-11 NOTE — Telephone Encounter (Signed)
Returned Tometta's call regarding concern for patient's Neulasta on body injector.  Per message patient is unsure if Neulasta was completed, and wanted to come by to have staff look at the on body injector.  Left message that staff would be glad to assist.  Contact information left for return call.

## 2018-05-17 ENCOUNTER — Inpatient Hospital Stay: Payer: Medicare Other

## 2018-05-17 ENCOUNTER — Ambulatory Visit (HOSPITAL_COMMUNITY)
Admission: RE | Admit: 2018-05-17 | Discharge: 2018-05-17 | Disposition: A | Discharge status: Home / Self Care | Payer: Medicare Other | Source: Ambulatory Visit | Attending: Medical | Admitting: Medical

## 2018-05-17 ENCOUNTER — Telehealth: Payer: Self-pay | Admitting: *Deleted

## 2018-05-17 ENCOUNTER — Inpatient Hospital Stay (HOSPITAL_BASED_OUTPATIENT_CLINIC_OR_DEPARTMENT_OTHER): Payer: Medicare Other | Admitting: Medical

## 2018-05-17 VITALS — BP 108/78 | HR 88 | Temp 98.0°F | Resp 18

## 2018-05-17 DIAGNOSIS — C50919 Malignant neoplasm of unspecified site of unspecified female breast: Secondary | ICD-10-CM | POA: Diagnosis not present

## 2018-05-17 DIAGNOSIS — K219 Gastro-esophageal reflux disease without esophagitis: Secondary | ICD-10-CM

## 2018-05-17 DIAGNOSIS — C50812 Malignant neoplasm of overlapping sites of left female breast: Secondary | ICD-10-CM

## 2018-05-17 DIAGNOSIS — Z170 Estrogen receptor positive status [ER+]: Secondary | ICD-10-CM

## 2018-05-17 DIAGNOSIS — C50312 Malignant neoplasm of lower-inner quadrant of left female breast: Secondary | ICD-10-CM

## 2018-05-17 DIAGNOSIS — G629 Polyneuropathy, unspecified: Secondary | ICD-10-CM

## 2018-05-17 DIAGNOSIS — D6481 Anemia due to antineoplastic chemotherapy: Secondary | ICD-10-CM | POA: Diagnosis not present

## 2018-05-17 DIAGNOSIS — R0602 Shortness of breath: Secondary | ICD-10-CM

## 2018-05-17 DIAGNOSIS — Z95828 Presence of other vascular implants and grafts: Secondary | ICD-10-CM

## 2018-05-17 DIAGNOSIS — Z5189 Encounter for other specified aftercare: Secondary | ICD-10-CM | POA: Diagnosis not present

## 2018-05-17 DIAGNOSIS — Z5111 Encounter for antineoplastic chemotherapy: Secondary | ICD-10-CM | POA: Diagnosis not present

## 2018-05-17 DIAGNOSIS — Z452 Encounter for adjustment and management of vascular access device: Secondary | ICD-10-CM | POA: Diagnosis not present

## 2018-05-17 LAB — CMP (CANCER CENTER ONLY)
ALT: 17 U/L (ref 0–44)
ANION GAP: 12 (ref 5–15)
AST: 24 U/L (ref 15–41)
Albumin: 3.6 g/dL (ref 3.5–5.0)
Alkaline Phosphatase: 114 U/L (ref 38–126)
BUN: 17 mg/dL (ref 8–23)
CHLORIDE: 106 mmol/L (ref 98–111)
CO2: 24 mmol/L (ref 22–32)
Calcium: 9.3 mg/dL (ref 8.9–10.3)
Creatinine: 0.95 mg/dL (ref 0.44–1.00)
Glucose, Bld: 181 mg/dL — ABNORMAL HIGH (ref 70–99)
POTASSIUM: 4 mmol/L (ref 3.5–5.1)
SODIUM: 142 mmol/L (ref 135–145)
Total Bilirubin: 0.2 mg/dL — ABNORMAL LOW (ref 0.3–1.2)
Total Protein: 6.9 g/dL (ref 6.5–8.1)

## 2018-05-17 LAB — CBC WITH DIFFERENTIAL (CANCER CENTER ONLY)
Basophils Absolute: 0.3 10*3/uL — ABNORMAL HIGH (ref 0.0–0.1)
Basophils Relative: 1 %
EOS ABS: 0.3 10*3/uL (ref 0.0–0.5)
Eosinophils Relative: 1 %
HCT: 30.3 % — ABNORMAL LOW (ref 34.8–46.6)
HEMOGLOBIN: 10 g/dL — AB (ref 11.6–15.9)
LYMPHS ABS: 2.9 10*3/uL (ref 0.9–3.3)
LYMPHS PCT: 7 %
MCH: 29.6 pg (ref 25.1–34.0)
MCHC: 33 g/dL (ref 31.5–36.0)
MCV: 89.6 fL (ref 79.5–101.0)
MONOS PCT: 9 %
Monocytes Absolute: 3.6 10*3/uL — ABNORMAL HIGH (ref 0.1–0.9)
NEUTROS ABS: 33 10*3/uL — AB (ref 1.5–6.5)
NEUTROS PCT: 82 %
NRBC: 2 /100{WBCs} — AB
Platelet Count: 137 10*3/uL — ABNORMAL LOW (ref 145–400)
RBC: 3.38 MIL/uL — AB (ref 3.70–5.45)
RDW: 17.3 % — ABNORMAL HIGH (ref 11.2–14.5)
WBC: 40 10*3/uL — AB (ref 3.9–10.3)

## 2018-05-17 MED ORDER — GABAPENTIN 300 MG PO CAPS: 300.0000 mg | ORAL_CAPSULE | Freq: Every day | ORAL | 5 refills | Status: DC

## 2018-05-17 MED ORDER — OMEPRAZOLE 40 MG PO CPDR: 40.0000 mg | DELAYED_RELEASE_CAPSULE | Freq: Every day | ORAL | 5 refills | Status: AC

## 2018-05-17 MED ORDER — HEPARIN SOD (PORK) LOCK FLUSH 100 UNIT/ML IV SOLN
500.0000 [IU] | Freq: Once | INTRAVENOUS | Status: AC
  Administered 2018-05-17: 500 [IU]
  Filled 2018-05-17: qty 5

## 2018-05-17 MED ORDER — SODIUM CHLORIDE 0.9% FLUSH
10.0000 mL | Freq: Once | INTRAVENOUS | Status: AC
  Administered 2018-05-17: 10 mL
  Filled 2018-05-17: qty 10

## 2018-05-17 NOTE — Patient Instructions (Signed)
Implanted Port Home Guide An implanted port is a type of central line that is placed under the skin. Central lines are used to provide IV access when treatment or nutrition needs to be given through a person's veins. Implanted ports are used for long-term IV access. An implanted port may be placed because:  You need IV medicine that would be irritating to the small veins in your hands or arms.  You need long-term IV medicines, such as antibiotics.  You need IV nutrition for a long period.  You need frequent blood draws for lab tests.  You need dialysis.  Implanted ports are usually placed in the chest area, but they can also be placed in the upper arm, the abdomen, or the leg. An implanted port has two main parts:  Reservoir. The reservoir is round and will appear as a small, raised area under your skin. The reservoir is the part where a needle is inserted to give medicines or draw blood.  Catheter. The catheter is a thin, flexible tube that extends from the reservoir. The catheter is placed into a large vein. Medicine that is inserted into the reservoir goes into the catheter and then into the vein.  How will I care for my incision site? Do not get the incision site wet. Bathe or shower as directed by your health care provider. How is my port accessed? Special steps must be taken to access the port:  Before the port is accessed, a numbing cream can be placed on the skin. This helps numb the skin over the port site.  Your health care provider uses a sterile technique to access the port. ? Your health care provider must put on a mask and sterile gloves. ? The skin over your port is cleaned carefully with an antiseptic and allowed to dry. ? The port is gently pinched between sterile gloves, and a needle is inserted into the port.  Only "non-coring" port needles should be used to access the port. Once the port is accessed, a blood return should be checked. This helps ensure that the port  is in the vein and is not clogged.  If your port needs to remain accessed for a constant infusion, a clear (transparent) bandage will be placed over the needle site. The bandage and needle will need to be changed every week, or as directed by your health care provider.  Keep the bandage covering the needle clean and dry. Do not get it wet. Follow your health care provider's instructions on how to take a shower or bath while the port is accessed.  If your port does not need to stay accessed, no bandage is needed over the port.  What is flushing? Flushing helps keep the port from getting clogged. Follow your health care provider's instructions on how and when to flush the port. Ports are usually flushed with saline solution or a medicine called heparin. The need for flushing will depend on how the port is used.  If the port is used for intermittent medicines or blood draws, the port will need to be flushed: ? After medicines have been given. ? After blood has been drawn. ? As part of routine maintenance.  If a constant infusion is running, the port may not need to be flushed.  How long will my port stay implanted? The port can stay in for as long as your health care provider thinks it is needed. When it is time for the port to come out, surgery will be   done to remove it. The procedure is similar to the one performed when the port was put in. When should I seek immediate medical care? When you have an implanted port, you should seek immediate medical care if:  You notice a bad smell coming from the incision site.  You have swelling, redness, or drainage at the incision site.  You have more swelling or pain at the port site or the surrounding area.  You have a fever that is not controlled with medicine.  This information is not intended to replace advice given to you by your health care provider. Make sure you discuss any questions you have with your health care provider. Document  Released: 09/20/2005 Document Revised: 02/26/2016 Document Reviewed: 05/28/2013 Elsevier Interactive Patient Education  2017 [employer]  

## 2018-05-17 NOTE — Telephone Encounter (Signed)
TC from patient @8 :50 am this am. She is c/o numerous symptoms includingchills, nausea, constipation with black stools, shortness of breath and occasional chest pressure. She also has numbness in her fingers, difficulty walking d/t SOB, trouble swallowing.  Pt had Taxotere/cytoxan on 05/09/18. Sje is agreeable to coming in for labs and to be seen in [facility] with Vabn Tanner, PA  Labs/port flush and appt in [facility] ordered STAT. Pt will arrive for labs at 10 am and then be seen by Sandi Mealy, PA @ 10:30 am

## 2018-05-19 NOTE — Progress Notes (Signed)
[facility] Progress Note   [name] [id number] 09-Oct-1950 67 y.o.  [name] is managed by Dr. Nicholas Lose  Actively treated with chemotherapy/immunotherapy: yes  Current Therapy: Taxotere and Cytoxan  Last Treated: 05/09/2018 (cycle 2, day 1)  Assessment: Plan:    Neuropathy - Plan: gabapentin (NEURONTIN) 300 MG capsule  Shortness of breath - Plan: DG Chest 2 View  Port-A-Cath in place - Plan: heparin lock flush 100 unit/mL, sodium chloride flush (NS) 0.9 % injection 10 mL  Malignant neoplasm of lower-inner quadrant of left breast in female, estrogen receptor positive (HCC) - Plan: heparin lock flush 100 unit/mL, sodium chloride flush (NS) 0.9 % injection 10 mL  Gastroesophageal reflux disease, esophagitis presence not specified - Plan: omeprazole (PRILOSEC) 40 MG capsule   Neuropathy of the fingers and toes: The patient was given a prescription for gabapentin 300 mg p.o. nightly.    Shortness of breath: The patient was referred for a chest x-ray which returned showing: Right IJ Port-A-Cath is in place with the tip in the mid to lower superior vena cava. Lungs are clear. No pneumothorax or pleural effusion. Heart size is normal. No acute bony abnormality.  ER positive malignant neoplasm of the left breast: The patient is status post cycle 2, day 1 of Taxotere and Cytoxan which was dosed on 05/09/2018.  GERD: The patient was given a prescription for Prilosec 40 mg once daily.  Please see After Visit Summary for patient specific instructions.  Future Appointments  Date Time Provider [place]  05/30/2018  9:30 AM [facility] ONLY [facility] None  05/30/2018  9:45 AM [facility] [place] FLUSH [facility] None  05/30/2018 10:15 AM Nicholas Lose, MD [facility] None  05/30/2018 11:15 AM [facility] INFUSION [facility] None    Orders Placed This Encounter  Procedures  . DG Chest 2 View       Subjective:   Patient ID:  [name] is a  67 y.o. (DOB [birth year]) female.  Chief Complaint: No chief complaint on file.   HPI [name] is a 67 year old female who is managed by Dr. Lindi Adie for an ER positive malignant neoplasm of the left breast and was most recently treated with cycle 2, day 1 of Taxotere and Cytoxan on 05/09/2018.  She presents to the clinic today with multiple concerns including chills with no fever, numbness in her fingers, chest pain with deep breathing after receiving PEG filgrastim, sensations of feeling hot and cold, tenderness of her mouth with no ulcers, alternating constipation and loose stools, weakness, and generalized stomach upset.  She reports having some darker stools with out blood noted.  She denies dizziness, nausea, vomiting, dyspnea on exertion, or shortness of breath.  Medications: I have reviewed the patient's current medications.  Allergies:  Allergies  Allergen Reactions  . Lisinopril Other (See Comments)    Chronic cough secondary to ACE  . Oxycodone-Acetaminophen Hives and Itching    flushing  . Victoza [Liraglutide]     Made her feel "out of it"  . Atorvastatin Other (See Comments)    myalgias    Past Medical History:  Diagnosis Date  . Arthritis    "knees, lower back" (08/18/2017)  . 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 S/p lumpectomy and radiation.  Marland Kitchen CAD (coronary artery disease) 11/07   cath 11/11 :  3V CADsee report   . Cataract    small both eyes  . Chronic lower  back pain   . Chronic pain    MR 08/07/10 :  Spondylosis L4-5 with B foraminal narrowing and L4 nerve root enchroachment B.  . Chronic venous insufficiency 01-07-11   Has had full W/U incl ECHO, LFT's, Cr, Umicro, TSH, BNP. Symptomatic treatment.  . Colonic polyp 1995   Colonoscopies 1994, 2000, and 02/02/2011. Last had 9 polyps - Tubular adenoma and tubulovillous was the pathology results without high grade dysplasia  . Diabetes mellitus type II     Insulin dependent. Has worked with [name] R previously.  . Diastolic CHF, chronic ([place])    NL EF by echo 01/2012, Gr I dd  . Essential hypertension    Requires 5 drug therapy and still difficult to control.  Marland Kitchen GERD (gastroesophageal reflux disease)   . Heart murmur   . Hyperlipidemia    On daily statin  . Iron deficiency anemia    Ferritin was 12 in 2011-01-07. on FeSo4. Has had colon and EGD 02/02/11 that showed mild gastritis and colon polyps.  . Obesity    Morbid. HAs worked with [name] T previously.  . OSA (obstructive sleep apnea) 02/16/2007   02/2007 : Moderate AHI 35.6, O2 sat decreased to 65%, CPAP titration to 16 with AHI of 3.6. 02/2014 : Rare respiratory events with sleep disturbance, within normal limits. AHI 0.4 per hour      . OSA on CPAP   . Personal history of chemotherapy   . Personal history of radiation therapy    "to my left breast"  . Refusal of blood transfusions as patient is Jehovah's Witness   . Seasonal allergies    "grass pollen"    Past Surgical History:  Procedure Laterality Date  . BREAST LUMPECTOMY Left 01-06-05  . BREAST LUMPECTOMY W/ NEEDLE LOCALIZATION  2006   34 Chemo RX  . CARDIAC CATHETERIZATION N/A 08/15/2015   Procedure: Left Heart Cath and Coronary Angiography;  Surgeon: Troy Sine, MD;  Location: [place] [facility];  Service: Cardiovascular;  Laterality: N/A;  . COLONOSCOPY W/ BIOPSIES AND POLYPECTOMY  1994, 2000, 2012, 26/018  . IR IMAGING GUIDED PORT INSERTION  04/17/2018  . LEFT HEART CATH AND CORONARY ANGIOGRAPHY N/A 08/19/2017   Procedure: LEFT HEART CATH AND CORONARY ANGIOGRAPHY;  Surgeon: Belva Crome, MD;  Location: [place] [facility];  Service: Cardiovascular;  Laterality: N/A;  . PORTACATH PLACEMENT N/A 04/14/2018   Procedure: ATTEMPED INSERTION PORT-A-CATH;  Surgeon: Jovita Kussmaul, MD;  Location: [facility];  Service: General;  Laterality: N/A;  . TONSILLECTOMY AND ADENOIDECTOMY  01-07-1963  . TOTAL ABDOMINAL HYSTERECTOMY  1985   "I had  endometrosis"  . ULTRASOUND GUIDANCE FOR VASCULAR ACCESS  08/19/2017   Procedure: Ultrasound Guidance For Vascular Access;  Surgeon: Belva Crome, MD;  Location: [place] [facility];  Service: Cardiovascular;;    Family History  Problem Relation Age of Onset  . Lung cancer Mother   . Diabetes Father   . Breast cancer Sister   . Pulmonary embolism Brother   . Depression Daughter 36  . Osteoarthritis Maternal Grandmother   . Heart attack Brother 42  . Kidney failure Brother        died Jan 07, 2016 2/2 kidney and heart failure  . Colon cancer Neg Hx   . Colon polyps Neg Hx   . Esophageal cancer Neg Hx   . Rectal cancer Neg Hx   . Stomach cancer Neg Hx     Social History   Socioeconomic History  .  Marital status: Single    Spouse name: Not on file  . Number of children: Not on file  . Years of education: 50  . Highest education level: Not on file  Occupational History  . Not on file  Social Needs  . Financial resource strain: Not on file  . Food insecurity:    Worry: Not on file    Inability: Not on file  . Transportation needs:    Medical: Not on file    Non-medical: Not on file  Tobacco Use  . Smoking status: Never Smoker  . Smokeless tobacco: Never Used  Substance and Sexual Activity  . Alcohol use: No  . Drug use: No  . Sexual activity: Never  Lifestyle  . Physical activity:    Days per week: Not on file    Minutes per session: Not on file  . Stress: Not at all  Relationships  . Social connections:    Talks on phone: Not on file    Gets together: Not on file    Attends religious service: Not on file    Active member of club or organization: Not on file    Attends meetings of clubs or organizations: Not on file    Relationship status: Not on file  . Intimate partner violence:    Fear of current or ex partner: Not on file    Emotionally abused: Not on file    Physically abused: Not on file    Forced sexual activity: Not on file  Other Topics Concern  . Not  on file  Social History Narrative   Worked at [employer] part-time from [phone], 2009-2011. No longer working 2015.Associates degree in early childhood education. Single, lives by self.    Past Medical History, Surgical history, Social history, and Family history were reviewed and updated as appropriate.   Please see review of systems for further details on the patient's review from today.   Review of Systems:  Review of Systems  Constitutional: Positive for chills. Negative for appetite change, diaphoresis, fever and unexpected weight change.  HENT: Negative for mouth sores and trouble swallowing.   Cardiovascular: Positive for chest pain (Chest pain with deep breathing after receiving Neulasta.). Negative for leg swelling.  Gastrointestinal: Positive for constipation. Negative for abdominal distention, abdominal pain, anal bleeding, blood in stool, diarrhea, nausea, rectal pain and vomiting.       Dark stools with no blood.  Neurological: Positive for weakness and numbness.    Objective:   Physical Exam:  LMP 07/12/1984  ECOG: 1  Physical Exam  Constitutional: No distress.  HENT:  Head: Normocephalic and atraumatic.  Mouth/Throat: Oropharynx is clear and moist.  Cardiovascular: Normal rate, regular rhythm and normal heart sounds. Exam reveals no gallop and no friction rub.  No murmur heard. Pulmonary/Chest: Effort normal and breath sounds normal. No respiratory distress. She has no wheezes. She has no rales.  Abdominal: Soft. Bowel sounds are normal. She exhibits no distension and no mass. There is no tenderness. There is no rebound and no guarding.  Musculoskeletal: She exhibits no edema.  Neurological: She is alert.  Skin: Skin is warm and dry. She is not diaphoretic.  Psychiatric: She has a normal mood and affect. Her behavior is normal. Judgment and thought content normal.    Lab Review:     Component Value Date/Time   NA 142 05/17/2018 1050   NA 143  12/22/2017 1102   K 4.0 05/17/2018 1050   CL 106 05/17/2018 1050  CO2 24 05/17/2018 1050   GLUCOSE 181 (H) 05/17/2018 1050   BUN 17 05/17/2018 1050   BUN 18 12/22/2017 1102   CREATININE 0.95 05/17/2018 1050   CREATININE 0.80 10/17/2014 1106   CALCIUM 9.3 05/17/2018 1050   PROT 6.9 05/17/2018 1050   PROT 7.6 12/22/2017 1102   ALBUMIN 3.6 05/17/2018 1050   ALBUMIN 4.4 12/22/2017 1102   AST 24 05/17/2018 1050   ALT 17 05/17/2018 1050   ALKPHOS 114 05/17/2018 1050   BILITOT 0.2 (L) 05/17/2018 1050   GFRNONAA >60 05/17/2018 1050   GFRNONAA 79 10/17/2014 1106   GFRAA >60 05/17/2018 1050   GFRAA >89 10/17/2014 1106       Component Value Date/Time   WBC 40.0 (H) 05/17/2018 1050   WBC 7.3 04/17/2018 0815   RBC 3.38 (L) 05/17/2018 1050   HGB 10.0 (L) 05/17/2018 1050   HGB 12.1 12/22/2017 1102   HCT 30.3 (L) 05/17/2018 1050   HCT 36.4 12/22/2017 1102   PLT 137 (L) 05/17/2018 1050   PLT 259 12/22/2017 1102   MCV 89.6 05/17/2018 1050   MCV 88 12/22/2017 1102   MCH 29.6 05/17/2018 1050   MCHC 33.0 05/17/2018 1050   RDW 17.3 (H) 05/17/2018 1050   RDW 15.9 (H) 12/22/2017 1102   LYMPHSABS 2.9 05/17/2018 1050   LYMPHSABS 3.4 (H) 12/22/2017 1102   MONOABS 3.6 (H) 05/17/2018 1050   EOSABS 0.3 05/17/2018 1050   EOSABS 0.2 12/22/2017 1102   BASOSABS 0.3 (H) 05/17/2018 1050   BASOSABS 0.0 12/22/2017 1102   -------------------------------  Imaging from last 24 hours (if applicable):  Radiology interpretation: Dg Chest 2 View  Result Date: 05/17/2018 CLINICAL DATA:  Increasing shortness of breath over the past week since chemotherapy for breast carcinoma. EXAM: CHEST - 2 VIEW COMPARISON:  CT chest, abdomen and pelvis 04/20/2018. Single-view of the chest 04/14/2018. FINDINGS: Right IJ Port-A-Cath is in place with the tip in the mid to lower superior vena cava. Lungs are clear. No pneumothorax or pleural effusion. Heart size is normal. No acute bony abnormality. IMPRESSION: No acute  disease. Electronically Signed   By: Inge Rise M.D.   On: 05/17/2018 13:05   Ct Chest W Contrast  Result Date: 04/20/2018 CLINICAL DATA:  Patient with history of breast cancer diagnosed in 2006. Follow-up exam. EXAM: CT CHEST, ABDOMEN, AND PELVIS WITH CONTRAST TECHNIQUE: Multidetector CT imaging of the chest, abdomen and pelvis was performed following the standard protocol during bolus administration of intravenous contrast. CONTRAST:  120m ISOVUE-300 IOPAMIDOL (ISOVUE-300) INJECTION 61% COMPARISON:  Chest CT 08/18/2017. FINDINGS: CT CHEST FINDINGS Cardiovascular: Right anterior chest wall port is present with tip terminating in the right atrium. Heart is mildly enlarged. Coronary arterial and thoracic aortic vascular calcifications. Trace fluid superior pericardial recess. Mediastinum/Nodes: Cortically thickened right axillary lymph node measuring 1.2 cm (image 28; series 2). Postsurgical changes left axilla. No mediastinal or hilar lymphadenopathy. Normal appearance of the esophagus. Lungs/Pleura: Central airways are patent. Dependent atelectasis within the bilateral lower lobes. No large area pulmonary consolidation. No pleural effusion or pneumothorax. No discrete pulmonary nodularity. Musculoskeletal: Thoracic spine degenerative changes. No aggressive or acute appearing osseous lesions. There is extensive skin thickening involving the left breast. Multiple masses are demonstrated within the left breast including a 2.2 cm mass within the lower inner quadrant and a 3.9 cm mass superiorly retroareolar location. Postsurgical changes involving the left breast. CT ABDOMEN PELVIS FINDINGS Hepatobiliary: Liver is normal in size and contour. Focal fatty deposition adjacent to  the falciform ligament. Gallbladder is unremarkable. No intrahepatic or extrahepatic biliary ductal dilatation. Pancreas: Unremarkable Spleen: Unremarkable Adrenals/Urinary Tract: Normal adrenal glands. Kidneys enhance symmetrically  with contrast. No suspicious renal mass. Two subcentimeter too small to characterize low-attenuation lesions left kidney. Urinary bladder is unremarkable. Stomach/Bowel: Stool throughout the colon. No abnormal bowel wall thickening or evidence for bowel obstruction. Small hiatal hernia. Normal morphology of the stomach. No evidence for small bowel obstruction. No free fluid or free intraperitoneal air. Vascular/Lymphatic: Normal caliber abdominal aorta. Peripheral calcified atherosclerotic plaque. No retroperitoneal lymphadenopathy. Reproductive: Status post hysterectomy. Prominent left ovarian tissue superior to the bladder. Other: Fat containing anterior abdominal wall hernia. Musculoskeletal: Bilateral hip joint degenerative changes. Lumbar spine degenerative changes. No aggressive or acute appearing osseous lesions. IMPRESSION: 1. Multiple masses within the left breast compatible with recently diagnosed left breast carcinoma. 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 2. Cortically thickened right axillary lymph node, indeterminate. Metastatic disease not excluded. 3. No evidence for additional sites of disease within the chest, abdomen or pelvis. Electronically Signed   By: Lovey Newcomer M.D.   On: 04/20/2018 09:20   Nm Bone Scan Whole Body  Result Date: 04/27/2018 CLINICAL DATA:  Breast cancer.  Bone pain.  Chemotherapy ongoing. EXAM: NUCLEAR MEDICINE WHOLE BODY BONE SCAN TECHNIQUE: Whole body anterior and posterior images were obtained approximately 3 hours after intravenous injection of radiopharmaceutical. RADIOPHARMACEUTICALS:  19.7 mCi Technetium-78mMDP IV COMPARISON:  Chest CT 04/19/2018 FINDINGS: No focal uptake within the axillary or appendicular skeleton to suggest metastasis. Row of osteophytosis noted along the RIGHT border of the thoracic spine. Facet hypertrophy and osteophytosis on the LEFT at L5-S1. Degenerate uptake in the shoulders and knees.  IMPRESSION: No scintigraphic evidence skeletal metastasis. Multiple sites of disc osteophytic disease in the thoracic and lumbar spine as seen on comparison CT. Electronically Signed   By: SSuzy BouchardM.D.   On: 04/27/2018 11:56   Ct Abdomen Pelvis W Contrast  Result Date: 04/20/2018 CLINICAL DATA:  Patient with history of breast cancer diagnosed in 2006. Follow-up exam. EXAM: CT CHEST, ABDOMEN, AND PELVIS WITH CONTRAST TECHNIQUE: Multidetector CT imaging of the chest, abdomen and pelvis was performed following the standard protocol during bolus administration of intravenous contrast. CONTRAST:  1050mISOVUE-300 IOPAMIDOL (ISOVUE-300) INJECTION 61% COMPARISON:  Chest CT 08/18/2017. FINDINGS: CT CHEST FINDINGS Cardiovascular: Right anterior chest wall port is present with tip terminating in the right atrium. Heart is mildly enlarged. Coronary arterial and thoracic aortic vascular calcifications. Trace fluid superior pericardial recess. Mediastinum/Nodes: Cortically thickened right axillary lymph node measuring 1.2 cm (image 28; series 2). Postsurgical changes left axilla. No mediastinal or hilar lymphadenopathy. Normal appearance of the esophagus. Lungs/Pleura: Central airways are patent. Dependent atelectasis within the bilateral lower lobes. No large area pulmonary consolidation. No pleural effusion or pneumothorax. No discrete pulmonary nodularity. Musculoskeletal: Thoracic spine degenerative changes. No aggressive or acute appearing osseous lesions. There is extensive skin thickening involving the left breast. Multiple masses are demonstrated within the left breast including a 2.2 cm mass within the lower inner quadrant and a 3.9 cm mass superiorly retroareolar location. Postsurgical changes involving the left breast. CT ABDOMEN PELVIS FINDINGS Hepatobiliary: Liver is normal in size and contour. Focal fatty deposition adjacent to the falciform ligament. Gallbladder is unremarkable. No intrahepatic or  extrahepatic biliary ductal dilatation. Pancreas: Unremarkable Spleen: Unremarkable Adrenals/Urinary Tract: Normal adrenal glands. Kidneys enhance symmetrically with contrast. No suspicious renal mass. Two subcentimeter too small to characterize low-attenuation lesions left  kidney. Urinary bladder is unremarkable. Stomach/Bowel: Stool throughout the colon. No abnormal bowel wall thickening or evidence for bowel obstruction. Small hiatal hernia. Normal morphology of the stomach. No evidence for small bowel obstruction. No free fluid or free intraperitoneal air. Vascular/Lymphatic: Normal caliber abdominal aorta. Peripheral calcified atherosclerotic plaque. No retroperitoneal lymphadenopathy. Reproductive: Status post hysterectomy. Prominent left ovarian tissue superior to the bladder. Other: Fat containing anterior abdominal wall hernia. Musculoskeletal: Bilateral hip joint degenerative changes. Lumbar spine degenerative changes. No aggressive or acute appearing osseous lesions. IMPRESSION: 1. Multiple masses within the left breast compatible with recently diagnosed left breast carcinoma. 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 2. Cortically thickened right axillary lymph node, indeterminate. Metastatic disease not excluded. 3. No evidence for additional sites of disease within the chest, abdomen or pelvis. Electronically Signed   By: Lovey Newcomer M.D.   On: 04/20/2018 09:20   Mm Diag Breast Tomo Uni Right  Result Date: 05/05/2018 CLINICAL DATA:  67 year old patient with left breast cancer with associated left breast skin thickening. On a staging CT chest abdomen pelvis, a thickened right axillary lymph node was identified, for which ultrasound evaluation is requested. Additionally, the patient is due for her routine annual right mammogram. EXAM: DIGITAL DIAGNOSTIC RIGHT MAMMOGRAM WITH CAD AND TOMO ULTRASOUND RIGHT AXILLA COMPARISON:  Previous exam(s), including recent CT  chest abdomen pelvis. ACR Breast Density Category b: There are scattered areas of fibroglandular density. FINDINGS: No mass, architectural distortion, or suspicious microcalcifications identified in the right breast to suggest malignancy. Vascular calcifications are noted. Mammographic images were processed with CAD. On physical exam, no mass is palpated in the right axilla. Targeted ultrasound is performed, showing a inferior right axillary lymph node with a homogeneously diffusely thickened cortex. Cortical thickness is 0.6 cm. There is attenuation of the fatty hilum. The lymph node measures 2.0 cm in length by 1.1 cm in AP diameter. This is thought to correspond to the lymph node identified on the recent CT chest. No additional suspicious right axillary lymph nodes. IMPRESSION: Single prominent inferior right axillary lymph node, for which metastatic disease cannot be excluded. This lymph node corresponds to the lymph node identified on recent chest CT. No mammographic evidence of malignancy in the right breast. RECOMMENDATION: Ultrasound-guided core needle biopsy was suggested to the patient. At this time, she declines biopsy. I have discussed the findings and recommendations with the patient. Results were also provided in writing at the conclusion of the visit. If applicable, a reminder letter will be sent to the patient regarding the next appointment. BI-RADS CATEGORY  4: Suspicious. Electronically Signed   By: Curlene Dolphin M.D.   On: 05/05/2018 13:06   [place] Axillary Node Core Biopsy Right  Addendum Date: 05/15/2018   ADDENDUM REPORT: 05/15/2018 13:44 ADDENDUM: Pathology revealed METASTATIC POORLY DIFFERENTIATED CARCINOMA of RIGHT axilla. Based on the patient's history, this is favored to be metastasis from her known breast carcinoma. This was found to be concordant by Dr. Dorise Bullion. Pathology results were discussed with the patient by telephone. The patient reported doing well after the biopsy with  tenderness at the site. Post biopsy instructions and care were reviewed and questions were answered. The patient was encouraged to call [facility] for any additional concerns. The patient has a recent diagnosis of LEFT breast cancer and should follow her outlined treatment plan. The patient is scheduled to see Dr. Nicholas Lose, Medical Oncologist, at [facility], [place], [place] on May 30, 2018. Pathology results reported by Roselind Messier, RN on 05/15/2018. Electronically Signed   By: Dorise Bullion III M.D   On: 05/15/2018 13:44   Result Date: 05/15/2018 CLINICAL DATA:  Known left breast cancer. Abnormal right axillary node. EXAM: [place] AXILLARY NODE CORE BIOPSY RIGHT COMPARISON:  Previous exam(s). FINDINGS: I met with the patient and we discussed the procedure of ultrasound-guided biopsy, including benefits and alternatives. We discussed the high likelihood of a successful procedure. We discussed the risks of the procedure, including infection, bleeding, tissue injury, clip migration, and inadequate sampling. Informed written consent was given. The usual time-out protocol was performed immediately prior to the procedure. Using sterile technique and 1% Lidocaine as local anesthetic, under direct ultrasound visualization, a 14 gauge spring-loaded device was used to perform biopsy of an abnormal right axillary lymph node using a lateral approach. The patient declined a biopsy marker. IMPRESSION: Ultrasound guided biopsy of an abnormal right axillary lymph node. No apparent complications. Electronically Signed: By: Dorise Bullion III M.D On: 05/11/2018 14:41   [place] Axilla Right  Result Date: 05/05/2018 CLINICAL DATA:  67 year old patient with left breast cancer with associated left breast skin thickening. On a staging CT chest abdomen pelvis, a thickened right axillary lymph node was identified, for which ultrasound evaluation is requested. Additionally, the patient is due  for her routine annual right mammogram. EXAM: DIGITAL DIAGNOSTIC RIGHT MAMMOGRAM WITH CAD AND TOMO ULTRASOUND RIGHT AXILLA COMPARISON:  Previous exam(s), including recent CT chest abdomen pelvis. ACR Breast Density Category b: There are scattered areas of fibroglandular density. FINDINGS: No mass, architectural distortion, or suspicious microcalcifications identified in the right breast to suggest malignancy. Vascular calcifications are noted. Mammographic images were processed with CAD. On physical exam, no mass is palpated in the right axilla. Targeted ultrasound is performed, showing a inferior right axillary lymph node with a homogeneously diffusely thickened cortex. Cortical thickness is 0.6 cm. There is attenuation of the fatty hilum. The lymph node measures 2.0 cm in length by 1.1 cm in AP diameter. This is thought to correspond to the lymph node identified on the recent CT chest. No additional suspicious right axillary lymph nodes. IMPRESSION: Single prominent inferior right axillary lymph node, for which metastatic disease cannot be excluded. This lymph node corresponds to the lymph node identified on recent chest CT. No mammographic evidence of malignancy in the right breast. RECOMMENDATION: Ultrasound-guided core needle biopsy was suggested to the patient. At this time, she declines biopsy. I have discussed the findings and recommendations with the patient. Results were also provided in writing at the conclusion of the visit. If applicable, a reminder letter will be sent to the patient regarding the next appointment. BI-RADS CATEGORY  4: Suspicious. Electronically Signed   By: Curlene Dolphin M.D.   On: 05/05/2018 13:06

## 2018-05-30 ENCOUNTER — Encounter: Payer: Self-pay | Admitting: *Deleted

## 2018-05-30 ENCOUNTER — Inpatient Hospital Stay (HOSPITAL_BASED_OUTPATIENT_CLINIC_OR_DEPARTMENT_OTHER): Payer: Medicare Other | Admitting: Hematology and Oncology

## 2018-05-30 ENCOUNTER — Inpatient Hospital Stay: Payer: Medicare Other

## 2018-05-30 ENCOUNTER — Telehealth: Payer: Self-pay | Admitting: Oncology

## 2018-05-30 DIAGNOSIS — Z170 Estrogen receptor positive status [ER+]: Secondary | ICD-10-CM | POA: Diagnosis not present

## 2018-05-30 DIAGNOSIS — C50812 Malignant neoplasm of overlapping sites of left female breast: Secondary | ICD-10-CM | POA: Diagnosis not present

## 2018-05-30 DIAGNOSIS — C50312 Malignant neoplasm of lower-inner quadrant of left female breast: Secondary | ICD-10-CM

## 2018-05-30 DIAGNOSIS — D6481 Anemia due to antineoplastic chemotherapy: Secondary | ICD-10-CM | POA: Diagnosis not present

## 2018-05-30 DIAGNOSIS — G629 Polyneuropathy, unspecified: Secondary | ICD-10-CM | POA: Diagnosis not present

## 2018-05-30 DIAGNOSIS — Z5189 Encounter for other specified aftercare: Secondary | ICD-10-CM | POA: Diagnosis not present

## 2018-05-30 DIAGNOSIS — E119 Type 2 diabetes mellitus without complications: Secondary | ICD-10-CM

## 2018-05-30 DIAGNOSIS — Z452 Encounter for adjustment and management of vascular access device: Secondary | ICD-10-CM | POA: Diagnosis not present

## 2018-05-30 DIAGNOSIS — Z5111 Encounter for antineoplastic chemotherapy: Secondary | ICD-10-CM | POA: Diagnosis not present

## 2018-05-30 LAB — CMP (CANCER CENTER ONLY)
ALT: 18 U/L (ref 0–44)
ANION GAP: 8 (ref 5–15)
AST: 13 U/L — AB (ref 15–41)
Albumin: 3.5 g/dL (ref 3.5–5.0)
Alkaline Phosphatase: 99 U/L (ref 38–126)
BUN: 24 mg/dL — ABNORMAL HIGH (ref 8–23)
CHLORIDE: 106 mmol/L (ref 98–111)
CO2: 25 mmol/L (ref 22–32)
Calcium: 9.9 mg/dL (ref 8.9–10.3)
Creatinine: 1.06 mg/dL — ABNORMAL HIGH (ref 0.44–1.00)
GFR, Estimated: 53 mL/min — ABNORMAL LOW (ref >60)
Glucose, Bld: 366 mg/dL — ABNORMAL HIGH (ref 70–99)
POTASSIUM: 4.2 mmol/L (ref 3.5–5.1)
Sodium: 139 mmol/L (ref 135–145)
Total Bilirubin: <0.2 mg/dL — ABNORMAL LOW (ref 0.3–1.2)
Total Protein: 7 g/dL (ref 6.5–8.1)

## 2018-05-30 LAB — CBC WITH DIFFERENTIAL (CANCER CENTER ONLY)
BASOS ABS: 0 10*3/uL (ref 0.0–0.1)
Basophils Relative: 0 %
EOS ABS: 0 10*3/uL (ref 0.0–0.5)
EOS PCT: 0 %
HCT: 30.6 % — ABNORMAL LOW (ref 34.8–46.6)
Hemoglobin: 9.7 g/dL — ABNORMAL LOW (ref 11.6–15.9)
Lymphocytes Relative: 15 %
Lymphs Abs: 1.8 10*3/uL (ref 0.9–3.3)
MCH: 29.6 pg (ref 25.1–34.0)
MCHC: 31.7 g/dL (ref 31.5–36.0)
MCV: 93.3 fL (ref 79.5–101.0)
MONO ABS: 1.3 10*3/uL — AB (ref 0.1–0.9)
Monocytes Relative: 10 %
Neutro Abs: 9.4 10*3/uL — ABNORMAL HIGH (ref 1.5–6.5)
Neutrophils Relative %: 75 %
PLATELETS: 294 10*3/uL (ref 145–400)
RBC: 3.28 MIL/uL — ABNORMAL LOW (ref 3.70–5.45)
RDW: 19.4 % — AB (ref 11.2–14.5)
WBC Count: 12.5 10*3/uL — ABNORMAL HIGH (ref 3.9–10.3)
nRBC: 1 /100 WBC — ABNORMAL HIGH

## 2018-05-30 MED ORDER — SODIUM CHLORIDE 0.9 % IV SOLN
Freq: Once | INTRAVENOUS | Status: AC
  Administered 2018-05-30: 11:00:00 via INTRAVENOUS
  Filled 2018-05-30: qty 5

## 2018-05-30 MED ORDER — SODIUM CHLORIDE 0.9% FLUSH
10.0000 mL | INTRAVENOUS | Status: DC | PRN
  Administered 2018-05-30: 10 mL
  Filled 2018-05-30: qty 10

## 2018-05-30 MED ORDER — HEPARIN SOD (PORK) LOCK FLUSH 100 UNIT/ML IV SOLN
500.0000 [IU] | Freq: Once | INTRAVENOUS | Status: AC | PRN
  Administered 2018-05-30: 500 [IU]
  Filled 2018-05-30: qty 5

## 2018-05-30 MED ORDER — PALONOSETRON HCL INJECTION 0.25 MG/5ML
0.2500 mg | Freq: Once | INTRAVENOUS | Status: AC
  Administered 2018-05-30: 0.25 mg via INTRAVENOUS

## 2018-05-30 MED ORDER — SODIUM CHLORIDE 0.9 % IV SOLN
60.0000 mg/m2 | Freq: Once | INTRAVENOUS | Status: AC
  Administered 2018-05-30: 120 mg via INTRAVENOUS
  Filled 2018-05-30: qty 12

## 2018-05-30 MED ORDER — PALONOSETRON HCL INJECTION 0.25 MG/5ML
INTRAVENOUS | Status: AC
  Filled 2018-05-30: qty 5

## 2018-05-30 MED ORDER — PEGFILGRASTIM 6 MG/0.6ML ~~LOC~~ PSKT
6.0000 mg | PREFILLED_SYRINGE | Freq: Once | SUBCUTANEOUS | Status: AC
  Administered 2018-05-30: 6 mg via SUBCUTANEOUS

## 2018-05-30 MED ORDER — SODIUM CHLORIDE 0.9 % IV SOLN
Freq: Once | INTRAVENOUS | Status: AC
  Administered 2018-05-30: 11:00:00 via INTRAVENOUS
  Filled 2018-05-30: qty 250

## 2018-05-30 MED ORDER — SODIUM CHLORIDE 0.9 % IV SOLN
600.0000 mg/m2 | Freq: Once | INTRAVENOUS | Status: AC
  Administered 2018-05-30: 1240 mg via INTRAVENOUS
  Filled 2018-05-30: qty 62

## 2018-05-30 MED ORDER — PEGFILGRASTIM 6 MG/0.6ML ~~LOC~~ PSKT
PREFILLED_SYRINGE | SUBCUTANEOUS | Status: AC
  Filled 2018-05-30: qty 0.6

## 2018-05-30 NOTE — Progress Notes (Signed)
Patient Care Team: Bartholomew Crews, MD as PCP - General (Internal Medicine) Hayden Pedro, MD as Consulting Physician (Ophthalmology) Shirley Muscat Loreen Freud, MD as Referring Physician (Optometry) Minus Breeding, MD as Consulting Physician (Cardiology)  DIAGNOSIS:  Encounter Diagnosis  Name Primary?  . 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 S/p lumpectomy and radiation.    03/21/2018 Initial Diagnosis    Peau [place] Orange skin anterior left breast: Ultrasound 7 o'clock position 2 cm from nipple irregular hypoechoic mass 2.2 x 2.5 x 2.5 cm (1 cm medial to prior lumpectomy); 12:00: Hypoechoic mass extending to skin 3.9 x 2.9 x 3.6 cm market skin thickening, no axillary lymph nodes    03/21/2018 Initial Biopsy    Left breast 12 o'clock position: IDC grade 3; 7 o'clock position: IDC grade 3, ER 40%, PR 0%, Ki-67 80%, HER-2 equivocal by FISH ratio 1.17 copy #4.4, IHC 1+ negative, T4d N0 stage IIIc clinical stage     04/20/2018 Imaging    IMPRESSION: 1. Multiple masses within the left breast (inflammatory breast carcinoma.) 2. Cortically thickened right axillary lymph node, indeterminate. Metastatic disease not excluded (patient refused biopsy)    05/09/2018 -  Neo-Adjuvant Chemotherapy    Neoadjuvant chemotherapy with TC X 4     CHIEF COMPLIANT: Cycle 3 Taxotere and Cytoxan  INTERVAL HISTORY: [name] is a 67 year old lady with above-mentioned history of multiple left breast masses inflammatory breast cancer with enlarged left axillary lymph node who is currently on neoadjuvant chemotherapy today cycle 3 of Taxotere and Cytoxan.  She has noticed numbness and tingling of the tips of the fingers and  toes.  Is also noticed that her blood sugars have been significantly worse.  Her primary care physician started her on insulin.  She denies any nausea or vomiting.  She is wheelchair-bound.  There was a left axillary lymph node biopsy which was positive for breast cancer.  REVIEW OF SYSTEMS:   Constitutional: Denies fevers, chills or abnormal weight loss Eyes: Denies blurriness of vision Ears, nose, mouth, throat, and face: Denies mucositis or sore throat Respiratory: Denies cough, dyspnea or wheezes Cardiovascular: Denies palpitation, chest discomfort Gastrointestinal:  Denies nausea, heartburn or change in bowel habits Skin: Denies abnormal skin rashes Lymphatics: Denies new lymphadenopathy or easy bruising Neurological: Complaining of peripheral neuropathy Behavioral/Psych: Mood is stable, no new changes  Extremities: No lower extremity edema  All other systems were reviewed with the patient and are negative.  I have reviewed the past medical history, past surgical history, social history and family history with the patient and they are unchanged from previous note.  ALLERGIES:  is allergic to lisinopril; oxycodone-acetaminophen; victoza [liraglutide]; and atorvastatin.  MEDICATIONS:  Current Outpatient Medications  Medication Sig Dispense Refill  . ACCU-CHEK FASTCLIX LANCETS MISC Check blood sugar 3 times a day 102 each 5  . amLODipine (NORVASC) 10 MG tablet TAKE ONE (1) TABLET BY MOUTH EVERY DAY 90 tablet 3  . aspirin EC 81 MG tablet Take 81 mg by mouth daily.    . cetirizine (ZYRTEC) 10 MG tablet Take 10 mg as needed by mouth for allergies.    . chlorpheniramine-HYDROcodone (TUSSIONEX PENNKINETIC ER) 10-8 MG/5ML SUER Take 5 mLs by mouth 2 (  two) times daily. 140 mL 0  . Coenzyme Q10 (CO Q 10 PO) Take 50 mg by mouth daily.     Marland Kitchen dexamethasone (DECADRON) 4 MG tablet Take 1 tablet (4 mg total) by mouth daily. Take 1 tablet day before chemo and 1 tablet day after chemo 10 tablet 0  .  ezetimibe (ZETIA) 10 MG tablet TAKE ONE (1) TABLET BY MOUTH EVERY DAY 30 tablet 7  . gabapentin (NEURONTIN) 300 MG capsule Take 1 capsule (300 mg total) by mouth at bedtime. 30 capsule 5  . glucose blood (ACCU-CHEK SMARTVIEW) test strip Check blood sugar 3 times a day 100 each 5  . insulin aspart (NOVOLOG FLEXPEN) 100 UNIT/ML FlexPen . If 201- 250 inject 2 units,251- 300 inject 3 units,301- 400  inject 4 units,> 400 inject 5 units, call office. 15 mL 1  . Insulin Glargine (TOUJEO SOLOSTAR) 300 UNIT/ML SOPN Inject 35 Units into the skin daily. 12 mL 3  . Insulin Pen Needle 32G X 4 MM MISC USE FOR INJECTIONS up to 4 times daily. Diagnosis code E11.40, Z79.4 200 each 5  . isosorbide mononitrate (IMDUR) 120 MG 24 hr tablet Take 2 tablets (240 mg total) by mouth every morning. (Patient taking differently: Take 240 mg by mouth daily. ) 30 tablet 11  . isosorbide mononitrate (IMDUR) 120 MG 24 hr tablet TAKE TWO (2) TABLETS BY MOUTH EVERY MORNING 60 tablet 6  . lidocaine-prilocaine (EMLA) cream Apply to affected area once 30 g 3  . losartan (COZAAR) 50 MG tablet TAKE ONE (1) TABLET BY MOUTH EVERY DAY 90 tablet 3  . Lutein 6 MG TABS Take 6 mg by mouth daily.    . meloxicam (MOBIC) 7.5 MG tablet TAKE 1 TABLET BY MOUTH ONCE DAILY 30 tablet 2  . metFORMIN (GLUCOPHAGE) 1000 MG tablet TAKE ONE (1) TABLET BY MOUTH TWO (2) TIMES DAILY WITH A MEAL 180 tablet 3  . metoprolol succinate (TOPROL-XL) 100 MG 24 hr tablet TAKE ONE (1) TABLET BY MOUTH EVERY DAY 90 tablet 3  . nitroGLYCERIN (NITRODUR - DOSED IN MG/24 HR) 0.4 mg/hr patch APPLY ONE PATCH ON SKIN EVERY DAY. LEAVE ON FOR 12 HOURS AND OFF FOR 12 HOURS. (Patient not taking: Reported on 04/25/2018) 100 patch 3  . nitroGLYCERIN (NITROSTAT) 0.4 MG SL tablet PLACE 1 TABLET UNDER THE TONGUE EVERY 5 MINUTES AS NEEDED FOR CHEST PAIN (Patient not taking: Reported on 04/25/2018) 25 tablet 7  . Olopatadine HCl (PATADAY) 0.2 % SOLN Place 1 drop into both eyes daily as needed.  For allergies    . omeprazole (PRILOSEC) 40 MG capsule Take 1 capsule (40 mg total) by mouth daily. 30 capsule 5  . ondansetron (ZOFRAN) 8 MG tablet Take 1 tablet (8 mg total) by mouth 2 (two) times daily as needed for refractory nausea / vomiting. Start on day 3 after chemo. 30 tablet 1  . OVER THE COUNTER MEDICATION Take 1 tablet by mouth daily. SUPER VITAMIN B-COMPLEX (Vitamin B6 and B12)    . prochlorperazine (COMPAZINE) 10 MG tablet Take 1 tablet (10 mg total) by mouth every 6 (six) hours as needed (Nausea or vomiting). 60 tablet 2  . repaglinide (PRANDIN) 2 MG tablet Take 2 tablets (4 mg total) by mouth daily with breakfast AND 2 tablets (4 mg total) daily with lunch AND 2 tablets (4 mg total) daily with supper. 270 tablet 3  . spironolactone (ALDACTONE) 25 MG tablet Take 1 tablet (25 mg total) by mouth daily. 90 tablet 3  .  traMADol (ULTRAM) 50 MG tablet Take 1 tablet (50 mg total) by mouth every 12 (twelve) hours as needed. 30 tablet 0  . Turmeric Curcumin 500 MG CAPS Take 500 mg by mouth daily.     No current facility-administered medications for this visit.    Facility-Administered Medications Ordered in Other Visits  Medication Dose Route Frequency Provider Last Rate Last Dose  . cyclophosphamide (CYTOXAN) 1,240 mg in sodium chloride 0.9 % 250 mL chemo infusion  600 mg/m2 (Treatment Plan Recorded) Intravenous Once Nicholas Lose, MD      . DOCEtaxel (TAXOTERE) 120 mg in sodium chloride 0.9 % 250 mL chemo infusion  60 mg/m2 (Treatment Plan Recorded) Intravenous Once Nicholas Lose, MD      . fosaprepitant (EMEND) 150 mg in sodium chloride 0.9 % 145 mL IVPB   Intravenous Once Nicholas Lose, MD 450 mL/hr at 05/30/18 1117    . heparin lock flush 100 unit/mL  500 Units Intracatheter Once PRN Nicholas Lose, MD      . pegfilgrastim (NEULASTA ONPRO KIT) injection 6 mg  6 mg Subcutaneous Once Nicholas Lose, MD      . sodium chloride flush (NS) 0.9 % injection 10 mL  10 mL Intracatheter PRN Nicholas Lose, MD        PHYSICAL EXAMINATION: ECOG PERFORMANCE STATUS: 1 - Symptomatic but completely ambulatory  Vitals:   05/30/18 1008  BP: 127/66  Pulse: 78  Resp: 17  Temp: 98.5 F (36.9 C)  SpO2: 100%   Filed Weights   05/30/18 1008  Weight: 220 lb (99.8 kg)    GENERAL:alert, no distress and comfortable SKIN: skin color, texture, turgor are normal, no rashes or significant lesions EYES: normal, Conjunctiva are pink and non-injected, sclera clear OROPHARYNX:no exudate, no erythema and lips, buccal mucosa, and tongue normal  NECK: supple, thyroid normal size, non-tender, without nodularity LYMPH:  no palpable lymphadenopathy in the cervical, axillary or inguinal LUNGS: clear to auscultation and percussion with normal breathing effort HEART: regular rate & rhythm and no murmurs and no lower extremity edema ABDOMEN:abdomen soft, non-tender and normal bowel sounds MUSCULOSKELETAL:no cyanosis of digits and no clubbing  NEURO: 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, wheelchair-bound EXTREMITIES: No lower extremity edema   LABORATORY DATA:  I have reviewed the data as listed CMP Latest Ref Rng & Units 05/30/2018 05/17/2018 05/09/2018  Glucose 70 - 99 mg/dL 366(H) 181(H) 307(H)  BUN 8 - 23 mg/dL 24(H) 17 18  Creatinine 0.44 - 1.00 mg/dL 1.06(H) 0.95 0.98  Sodium 135 - 145 mmol/L 139 142 138  Potassium 3.5 - 5.1 mmol/L 4.2 4.0 4.3  Chloride 98 - 111 mmol/L 106 106 104  CO2 22 - 32 mmol/L '25 24 22  '$ Calcium 8.9 - 10.3 mg/dL 9.9 9.3 9.7  Total Protein 6.5 - 8.1 g/dL 7.0 6.9 7.7  Total Bilirubin 0.3 - 1.2 mg/dL <0.2(L) 0.2(L) 0.2(L)  Alkaline Phos 38 - 126 U/L 99 114 87  AST 15 - 41 U/L 13(L) 24 15  ALT 0 - 44 U/L '18 17 22    '$ Lab Results  Component Value Date   WBC 12.5 (H) 05/30/2018   HGB 9.7 (L) 05/30/2018   HCT 30.6 (L) 05/30/2018   MCV 93.3 05/30/2018   PLT 294 05/30/2018   NEUTROABS 9.4 (H) 05/30/2018    ASSESSMENT & PLAN:  Malignant neoplasm of lower-inner quadrant of left  breast in female, estrogen receptor positive [facility]) 2006 June : 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 S/p lumpectomy  and radiation.  03/21/2018:[place]au [place] skin anterior left breast: Ultrasound 7 o'clock position 2 cm from nipple irregular hypoechoic mass 2.2 x 2.5 x 2.5 cm (1 cm medial to prior lumpectomy); 12:00: Hypoechoic mass extending to skin 3.9 x 2.9 x 3.6 cm market skin thickening, no axillary lymph nodes  Left breast 12 o'clock position: IDC grade 3; 7 o'clock position: IDC grade 3, ER 40%, PR 0%, Ki-67 80%, HER-2 equivocal by FISH ratio 1.17 copy #4.4, IHC 1+ negative, T4d N0 stage IIIc clinical stage  Right breast and axillary ultrasound: Prominent right axillary lymph node.    Biopsy revealed metastatic poorly differentiated carcinoma ER 0%, PR 0%, Ki-67 90%  CT CAP 04/20/18: Breast masses and axillary lymph node, no metastatic disease elsewhere ----------------------------------------------------------------------------- Treatment plan: 1.  Neoadjuvant chemotherapy with Taxotere Cytoxan x 4 cycles 2. followed by mastectomy 3.  Followed by radiation 4.  Followed by antiestrogen therapy ---------------------------------------------------------------------------- Current treatment: Cycle 3 day 1 Taxotere and Cytoxan  Labs have been reviewed Chemo toxicities: 1.  Nausea and vomiting: Resolved 2. chemo induced anemia: Being monitored, patient is a Jehovah's Witness 3.  Chemo-induced peripheral neuropathy: We will decrease the dosage of Taxotere with cycle 3  Diabetes mellitus: Because of rising blood sugars I discontinue dexamethasone.  I discussed the pathology report from the recent lymph node biopsy is being positive for breast cancer. Return to clinic in 3 weeks for cycle 4. I sent a message to Dr. Marlou Starks to see the patient after the last cycle of chemo.  We will set her up for breast MRIs after the last cycle.   Orders Placed This Encounter    Procedures  . MR BREAST BILATERAL W WO CONTRAST INC CAD    Standing Status:   Future    Standing Expiration Date:   07/31/2019    Order Specific Question:   ** REASON FOR EXAM (FREE TEXT)    Answer:   Post neoadj chemo MRI    Order Specific Question:   If indicated for the ordered procedure, I authorize the administration of contrast media per Radiology protocol    Answer:   Yes    Order Specific Question:   What is the patient's sedation requirement?    Answer:   No Sedation    Order Specific Question:   Does the patient have a pacemaker or implanted devices?    Answer:   No    Order Specific Question:   Radiology Contrast Protocol - do NOT remove file path    Answer:   [email or web address]    Order Specific Question:   Preferred imaging location?    Answer:   [facility] (table limit-550lbs)   The patient has a good understanding of the overall plan. she agrees with it. she will call with any problems that may develop before the next visit here.   Harriette Ohara, MD 05/30/18

## 2018-05-30 NOTE — Assessment & Plan Note (Signed)
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 S/p lumpectomy and radiation.  03/21/2018:[place]au d[place] Orange skin anterior left breast: Ultrasound 7 o'clock position 2 cm from nipple irregular hypoechoic mass 2.2 x 2.5 x 2.5 cm (1 cm medial to prior lumpectomy); 12:00: Hypoechoic mass extending to skin 3.9 x 2.9 x 3.6 cm market skin thickening, no axillary lymph nodes  Left breast 12 o'clock position: IDC grade 3; 7 o'clock position: IDC grade 3, ER 40%, PR 0%, Ki-67 80%, HER-2 equivocal by FISH ratio 1.17 copy #4.4, IHC 1+ negative, T4d N0 stage IIIc clinical stage  Right breast and axillary ultrasound: Prominent right axillary lymph node.    Biopsy revealed metastatic poorly differentiated carcinoma ER 0%, PR 0%, Ki-67 90%  CT CAP 04/20/18: Breast masses and axillary lymph node, no metastatic disease elsewhere ----------------------------------------------------------------------------- Treatment plan: 1.  Neoadjuvant chemotherapy with dose dense Adriamycin and Cytoxan x4 followed by Taxol weekly x12 2. followed by mastectomy 3.  Followed by radiation 4.  Followed by antiestrogen therapy ---------------------------------------------------------------------------- Current treatment: Cycle 3 day 1 Taxotere and Cytoxan  Labs have been reviewed Chemo toxicities: 1.  Nausea and vomiting  2. chemo induced anemia: Being monitored  I discussed the pathology report from the recent lymph node biopsy is being positive for breast cancer.

## 2018-05-30 NOTE — Telephone Encounter (Signed)
error 

## 2018-05-30 NOTE — Patient Instructions (Signed)
[facility] Discharge Instructions for Patients Receiving Chemotherapy  Today you received the following chemotherapy agents taxotere/cytoxan.   To help prevent nausea and vomiting after your treatment, we encourage you to take your nausea medication as directed.    If you develop nausea and vomiting that is not controlled by your nausea medication, call the clinic.   BELOW ARE SYMPTOMS THAT SHOULD BE REPORTED IMMEDIATELY:  *FEVER GREATER THAN 100.5 F  *CHILLS WITH OR WITHOUT FEVER  NAUSEA AND VOMITING THAT IS NOT CONTROLLED WITH YOUR NAUSEA MEDICATION  *UNUSUAL SHORTNESS OF BREATH  *UNUSUAL BRUISING OR BLEEDING  TENDERNESS IN MOUTH AND THROAT WITH OR WITHOUT PRESENCE OF ULCERS  *URINARY PROBLEMS  *BOWEL PROBLEMS  UNUSUAL RASH Items with * indicate a potential emergency and should be followed up as soon as possible.  Feel free to call the clinic you have any questions or concerns. The clinic phone number is [phone].  

## 2018-05-31 ENCOUNTER — Telehealth: Payer: Self-pay | Admitting: Hematology and Oncology

## 2018-05-31 NOTE — Telephone Encounter (Signed)
Mailed Pt calendar of upcoming appts  °

## 2018-06-06 ENCOUNTER — Encounter: Payer: Self-pay | Admitting: Dietician

## 2018-06-06 ENCOUNTER — Telehealth: Payer: Self-pay | Admitting: Dietician

## 2018-06-06 NOTE — Telephone Encounter (Signed)
Calling Ms. Fagin to follow up on blood sugar control during cancer treatment. Left message for a return call.

## 2018-06-12 ENCOUNTER — Encounter: Payer: Self-pay | Admitting: Dietician

## 2018-06-12 ENCOUNTER — Telehealth: Payer: Self-pay | Admitting: *Deleted

## 2018-06-12 ENCOUNTER — Telehealth: Payer: Self-pay

## 2018-06-12 NOTE — Telephone Encounter (Signed)
11:30 am  Received TC from patient stating that her feet are continuing to turn dark  "black". This is on her heels and the bottom of her feet.  Her feet are painful when she walks and has been keeping them elevated as much as possible.  She is currently taking neurontin for the numbness and pain in her feet.  Her next appt with Dr. Lindi Adie is 06/20/18. Transferred her to Dr. Geralyn Flash nurse as well as this note.

## 2018-06-12 NOTE — Telephone Encounter (Signed)
Returned patient's call.  Patient c/o blisters, discoloration, and pain in bilateral feet.  Patient has tried elevating legs, epsom salt soaks, and continue to take Gabapentin. Patient denies any open ares to feet.  Patient states the blisters are the size of 50 cent pieces.  Very painful to walk.  Per patient blood sugars have been running between 300-400.  Nurse collaborated with patient's PCP, Dr. Zenovia Jarred office.  Patient will be seen on 06/13/2018.  Patient aware.

## 2018-06-12 NOTE — Telephone Encounter (Signed)
Called and spoke with [name]. She reports that she is "Not doing so good, feet and hands turning darker- on neurontin for pain in feet and hands, sugar is high, wants to make an appointment after she sees cancer center for feet and hands. Feels like she has blisters on the inside and very painful."  She has been soaking her feet in epsom salts. Discussed that she may not want to soak her feet and told her to call our triage nurse if she cannot be seen at the cancer center.  Follow up tomorrow by phone.  [name], RD 06/12/2018 10:57 AM.

## 2018-06-13 ENCOUNTER — Ambulatory Visit (INDEPENDENT_AMBULATORY_CARE_PROVIDER_SITE_OTHER): Payer: Medicare Other | Admitting: Internal Medicine

## 2018-06-13 ENCOUNTER — Telehealth: Payer: Self-pay | Admitting: Internal Medicine

## 2018-06-13 ENCOUNTER — Encounter (INDEPENDENT_AMBULATORY_CARE_PROVIDER_SITE_OTHER): Payer: Self-pay

## 2018-06-13 ENCOUNTER — Encounter: Payer: Self-pay | Admitting: Internal Medicine

## 2018-06-13 ENCOUNTER — Other Ambulatory Visit: Payer: Self-pay

## 2018-06-13 VITALS — BP 150/66 | HR 75 | Temp 98.4°F | Ht 61.0 in | Wt 225.2 lb

## 2018-06-13 DIAGNOSIS — I5032 Chronic diastolic (congestive) heart failure: Secondary | ICD-10-CM

## 2018-06-13 DIAGNOSIS — R0602 Shortness of breath: Secondary | ICD-10-CM

## 2018-06-13 DIAGNOSIS — Z9989 Dependence on other enabling machines and devices: Secondary | ICD-10-CM

## 2018-06-13 DIAGNOSIS — Z7982 Long term (current) use of aspirin: Secondary | ICD-10-CM | POA: Diagnosis not present

## 2018-06-13 DIAGNOSIS — T451X5D Adverse effect of antineoplastic and immunosuppressive drugs, subsequent encounter: Secondary | ICD-10-CM | POA: Diagnosis not present

## 2018-06-13 DIAGNOSIS — G622 Polyneuropathy due to other toxic agents: Secondary | ICD-10-CM | POA: Diagnosis not present

## 2018-06-13 DIAGNOSIS — R42 Dizziness and giddiness: Secondary | ICD-10-CM

## 2018-06-13 DIAGNOSIS — I2510 Atherosclerotic heart disease of native coronary artery without angina pectoris: Secondary | ICD-10-CM | POA: Diagnosis not present

## 2018-06-13 DIAGNOSIS — C50912 Malignant neoplasm of unspecified site of left female breast: Secondary | ICD-10-CM | POA: Diagnosis not present

## 2018-06-13 DIAGNOSIS — G4733 Obstructive sleep apnea (adult) (pediatric): Secondary | ICD-10-CM | POA: Diagnosis not present

## 2018-06-13 DIAGNOSIS — I872 Venous insufficiency (chronic) (peripheral): Secondary | ICD-10-CM

## 2018-06-13 DIAGNOSIS — Z9221 Personal history of antineoplastic chemotherapy: Secondary | ICD-10-CM

## 2018-06-13 DIAGNOSIS — R600 Localized edema: Secondary | ICD-10-CM | POA: Diagnosis not present

## 2018-06-13 DIAGNOSIS — Z79899 Other long term (current) drug therapy: Secondary | ICD-10-CM | POA: Diagnosis not present

## 2018-06-13 DIAGNOSIS — G629 Polyneuropathy, unspecified: Secondary | ICD-10-CM

## 2018-06-13 MED ORDER — FUROSEMIDE 20 MG PO TABS: 20.0000 mg | ORAL_TABLET | ORAL | 0 refills | Status: DC | PRN

## 2018-06-13 MED ORDER — GABAPENTIN 300 MG PO CAPS: 300.0000 mg | ORAL_CAPSULE | Freq: Every day | ORAL | 2 refills | Status: DC

## 2018-06-13 NOTE — Progress Notes (Signed)
CC: LE edema  HPI:  [name] is a 67 y.o. female with ductal carcinoma of the breast (completed 3/4 rounds of taxotere cytoxan), T2DM, CHF (last Echo 08/2017 EF 61-95%; grade I diastolic dysfunction), OSA on CPAP, CAD, and chronic venous insufficiency who presents with bilateral UE and LE edema, platypnea, and discolored nails.  Patient reports that after her second round of chemotherapy 2-3 weeks ago, she began to have discoloration at her nail beds on both hands bilaterally as well as numbness of her upper extremities. She returned to her oncologist where she was diagnosed with chemotherapy-induced neuropathy and prescribed Neurotin. She reports that this medication did alleviate her bilateral hand numbness.  She was then feeling much better till she completed her third round of chemotherapy 1 week ago when she began to have bilateral upper and lower extremity edema. Additionally, she reports "blackness" of her heels bilaterally. Her lower extremity edema is worse whenever she stands up. She has had ankle swelling in the past but has never had this severe edema. She regularly takes Spironolactone 25 mg for her CHF but does not believe that it is helping with her current swelling. She also reports a 15 lb weight gain over the last several weeks. She has a history of chronic venous insufficiency and has not been using her compression stockings because her legs are too painful and swollen to be able to put on.  She also has a 1 week history of SOB when standing and with exertion. She denies cough, congestion, fevers. She did have one episode of left-sided chest pain which was alleviated with nitroglycerin. She is unsure how long it lasted. She denies current chest pain.   Past Medical History:  Diagnosis Date  . Arthritis    "knees, lower back" (08/18/2017)  . 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 S/p lumpectomy and radiation.  Marland Kitchen CAD  (coronary artery disease) 11/07   cath 11/11 :  3V CADsee report   . Cataract    small both eyes  . Chronic lower back pain   . Chronic pain    MR 08/07/10 :  Spondylosis L4-5 with B foraminal narrowing and L4 nerve root enchroachment B.  . Chronic venous insufficiency 2012   Has had full W/U incl ECHO, LFT's, Cr, Umicro, TSH, BNP. Symptomatic treatment.  . Colonic polyp 1995   Colonoscopies 1994, 2000, and 02/02/2011. Last had 9 polyps - Tubular adenoma and tubulovillous was the pathology results without high grade dysplasia  . Diabetes mellitus type II    Insulin dependent. Has worked with [name] R previously.  . Diastolic CHF, chronic ([place])    NL EF by echo 01/2012, Gr I dd  . Essential hypertension    Requires 5 drug therapy and still difficult to control.  Marland Kitchen GERD (gastroesophageal reflux disease)   . Heart murmur   . Hyperlipidemia    On daily statin  . Iron deficiency anemia    Ferritin was 12 in 2012. on FeSo4. Has had colon and EGD 02/02/11 that showed mild gastritis and colon polyps.  . Obesity    Morbid. HAs worked with [name] T previously.  . OSA (obstructive sleep apnea) 02/16/2007   02/2007 : Moderate AHI 35.6, O2 sat decreased to 65%, CPAP titration to 16 with AHI of 3.6. 02/2014 : Rare respiratory events with sleep disturbance, within normal limits. AHI 0.4 per hour      . OSA on CPAP   .  Personal history of chemotherapy   . Personal history of radiation therapy    "to my left breast"  . Refusal of blood transfusions as patient is Jehovah's Witness   . Seasonal allergies    "grass pollen"   Review of Systems:  Review of Systems  Constitutional: Negative for chills and fever.  HENT: Negative for congestion.   Respiratory: Positive for shortness of breath. Negative for cough.   Cardiovascular: Positive for chest pain and leg swelling. Negative for palpitations and orthopnea.       Platypnea  Gastrointestinal: Negative for abdominal pain, diarrhea, nausea and vomiting.    Genitourinary: Negative for dysuria, frequency and urgency.  Skin: Negative for itching and rash.  Neurological: Positive for dizziness.  All other systems reviewed and are negative.   Physical Exam:  Vitals:   06/13/18 0921  BP: (!) 150/66  Pulse: 75  Temp: 98.4 F (36.9 C)  TempSrc: Oral  SpO2: 100%  Weight: 225 lb 3.2 oz (102.2 kg)  Height: 5\' 1"  (1.549 m)   Physical Exam  Constitutional:  Obese female sitting up on the exam bed in no acute distress.  HENT:  Head: Normocephalic and atraumatic.  Eyes: EOM are normal.  Neck: Normal range of motion.  Cardiovascular: Normal rate.  Distant heart sounds due to large body habitus.  Pulmonary/Chest: Effort normal and breath sounds normal.  Abdominal: Soft. Bowel sounds are normal.  No pitting edema  Musculoskeletal:  1+ non-pitting edema up to her upper calf bilaterally. Tenderness to palpation of feet bilaterally. No tenderness to palpation calves bilaterally.   Skin: Skin is warm and dry.  Darkened spots on the nail beds of the upper and lwer extremities. Few sporadic dark areas of the palms bilaterally. Darkened skin at the heels.   Psychiatric: She has a normal mood and affect. Her behavior is normal.    Assessment & Plan:   See Encounters Tab for problem based charting.  Patient seen with Dr. Eppie Gibson

## 2018-06-13 NOTE — Patient Instructions (Signed)
I have prescribed you Lasix (furosemide) for you leg swelling. Please take one 20 mg tablet once a day as needed for your leg swelling. I will have you follow-up in the 1-2 weeks to re-check your blood work to make sure that you are not having any issues with your new fluid pill.  I will also order a heart ultrasound today. The imaging department will call you to set up an appointment.

## 2018-06-14 LAB — CMP14 + ANION GAP
A/G RATIO: 1.6 (ref 1.2–2.2)
ALBUMIN: 4.1 g/dL (ref 3.6–4.8)
ALK PHOS: 80 IU/L (ref 39–117)
ALT: 15 IU/L (ref 0–32)
ANION GAP: 14 mmol/L (ref 10.0–18.0)
AST: 16 IU/L (ref 0–40)
BUN/Creatinine Ratio: 19 (ref 12–28)
BUN: 16 mg/dL (ref 8–27)
CHLORIDE: 105 mmol/L (ref 96–106)
CO2: 22 mmol/L (ref 20–29)
Calcium: 8.9 mg/dL (ref 8.7–10.3)
Creatinine, Ser: 0.86 mg/dL (ref 0.57–1.00)
GFR, EST AFRICAN AMERICAN: 81 mL/min/{1.73_m2} (ref >59)
GFR, EST NON AFRICAN AMERICAN: 70 mL/min/{1.73_m2} (ref >59)
GLOBULIN, TOTAL: 2.6 g/dL (ref 1.5–4.5)
Glucose: 166 mg/dL — ABNORMAL HIGH (ref 65–99)
POTASSIUM: 4.1 mmol/L (ref 3.5–5.2)
SODIUM: 141 mmol/L (ref 134–144)
Total Protein: 6.7 g/dL (ref 6.0–8.5)

## 2018-06-15 DIAGNOSIS — R600 Localized edema: Secondary | ICD-10-CM | POA: Insufficient documentation

## 2018-06-15 DIAGNOSIS — I5032 Chronic diastolic (congestive) heart failure: Secondary | ICD-10-CM | POA: Insufficient documentation

## 2018-06-15 DIAGNOSIS — G629 Polyneuropathy, unspecified: Secondary | ICD-10-CM | POA: Insufficient documentation

## 2018-06-15 NOTE — Assessment & Plan Note (Signed)
Ordered repeat Echo to evaluate for worsening heart failure.

## 2018-06-15 NOTE — Progress Notes (Signed)
Patient ID: [name], female   DOB: [birth year], 67 y.o.   MRN: [id number]  I saw and evaluated the patient. I personally confirmed the key portions of Dr. Jerlyn Ly history and exam and reviewed pertinent patient test results. The assessment, diagnosis, and plan were formulated together and I agree with the documentation in the resident's note with the following caveat: My assessment suggests that her LE edema is secondary to acute on chronic venous insufficiency.  She has had pain in her feet s/p chemo and therefore has not been able to use her compression stockings.  I doubt this is new onset heart failure given the lack of orthopnea and pulmonary edema.

## 2018-06-15 NOTE — Assessment & Plan Note (Signed)
Assessment: I am concerned that [name]'s SOB on exertion is due to fluid overload 2/2 worsening CHF or chemo-induced cardiomyopathy. She had mild crackles at the bases bilaterally today.  Plan: 1. Ordered Echo to evaluate heart function 2. Prescribed Lasix 20 mg QD

## 2018-06-15 NOTE — Assessment & Plan Note (Signed)
Assessment: [name] reports improvement of her peripheral neuropathy with Gabapentin 300 mg qhs  Plan: 1. Refilled Gabapentin

## 2018-06-15 NOTE — Assessment & Plan Note (Addendum)
Assessment: [name] presents with bilateral LE edema in the setting of chemotherapy for her breast cancer. Differential diagnosis includes exacerbation of her chronic venous insufficiency, acute on chronic CHF, chemotherapy-induced cardiomyopathy. Hepatotoxicity from chemotherapy less likely given normal LFT's.   Plan:  1. Ordered Echo to evaluate for worsening heart failure 2. Start Lasix 20 mg QD PRN edema 3. Recommended using her compression stockings

## 2018-06-16 ENCOUNTER — Other Ambulatory Visit: Payer: Medicare Other

## 2018-06-19 NOTE — Assessment & Plan Note (Addendum)
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 S/p lumpectomy and radiation.  03/21/2018:Peau d' Orange skin anterior left breast: Ultrasound 7 o'clock position 2 cm from nipple irregular hypoechoic mass 2.2 x 2.5 x 2.5 cm (1 cm medial to prior lumpectomy); 12:00: Hypoechoic mass extending to skin 3.9 x 2.9 x 3.6 cm market skin thickening, no axillary lymph nodes  Left breast 12 o'clock position: IDC grade 3; 7 o'clock position: IDC grade 3, ER 40%, PR 0%, Ki-67 80%, HER-2 equivocal by FISH ratio 1.17 copy #4.4, IHC 1+ negative, T4d N0 stage IIIc clinical stage  Right breast and axillary ultrasound: Prominent right axillary lymph node.   Biopsy revealed metastatic poorly differentiated carcinoma ER 0%, PR 0%, Ki-67 90%  CT CAP 04/20/18: Breast masses and axillary lymph node, no metastatic disease elsewhere ----------------------------------------------------------------------------- Treatment plan: 1. Neoadjuvant chemotherapy with Taxotere Cytoxan x 4 cycles 2. followed by mastectomy 3. Followed by radiation 4. Followed by antiestrogen therapy ---------------------------------------------------------------------------- Current treatment:Cycle4day 1 Taxotereand Cytoxan  Labs have been reviewed Chemo toxicities: 1.Nausea and vomiting: Resolved 2.chemo induced anemia: Being monitored, patient is a Jehovah's Witness 3.  Chemo-induced peripheral neuropathy: We decreased the dosage of Taxotere with cycle 3  Diabetes mellitus: Because of rising blood sugars I discontinue dexamethasone.  Plan: Breast MRi and follow up

## 2018-06-20 ENCOUNTER — Inpatient Hospital Stay: Payer: Medicare Other

## 2018-06-20 ENCOUNTER — Inpatient Hospital Stay: Payer: Medicare Other | Attending: Hematology and Oncology

## 2018-06-20 ENCOUNTER — Inpatient Hospital Stay (HOSPITAL_BASED_OUTPATIENT_CLINIC_OR_DEPARTMENT_OTHER): Payer: Medicare Other | Admitting: Hematology and Oncology

## 2018-06-20 ENCOUNTER — Telehealth: Payer: Self-pay | Admitting: Hematology and Oncology

## 2018-06-20 DIAGNOSIS — Z170 Estrogen receptor positive status [ER+]: Principal | ICD-10-CM

## 2018-06-20 DIAGNOSIS — C50312 Malignant neoplasm of lower-inner quadrant of left female breast: Secondary | ICD-10-CM

## 2018-06-20 DIAGNOSIS — D6481 Anemia due to antineoplastic chemotherapy: Secondary | ICD-10-CM

## 2018-06-20 DIAGNOSIS — E1165 Type 2 diabetes mellitus with hyperglycemia: Secondary | ICD-10-CM | POA: Insufficient documentation

## 2018-06-20 DIAGNOSIS — Z5111 Encounter for antineoplastic chemotherapy: Secondary | ICD-10-CM | POA: Insufficient documentation

## 2018-06-20 DIAGNOSIS — C50812 Malignant neoplasm of overlapping sites of left female breast: Secondary | ICD-10-CM | POA: Insufficient documentation

## 2018-06-20 DIAGNOSIS — G620 Drug-induced polyneuropathy: Secondary | ICD-10-CM | POA: Diagnosis not present

## 2018-06-20 DIAGNOSIS — Z95828 Presence of other vascular implants and grafts: Secondary | ICD-10-CM

## 2018-06-20 DIAGNOSIS — Z5189 Encounter for other specified aftercare: Secondary | ICD-10-CM | POA: Diagnosis not present

## 2018-06-20 LAB — CBC WITH DIFFERENTIAL (CANCER CENTER ONLY)
Basophils Absolute: 0 10*3/uL (ref 0.0–0.1)
Basophils Relative: 1 %
EOS PCT: 2 %
Eosinophils Absolute: 0.1 10*3/uL (ref 0.0–0.5)
HCT: 31.9 % — ABNORMAL LOW (ref 34.8–46.6)
Hemoglobin: 10 g/dL — ABNORMAL LOW (ref 11.6–15.9)
LYMPHS ABS: 1.5 10*3/uL (ref 0.9–3.3)
LYMPHS PCT: 25 %
MCH: 29.6 pg (ref 25.1–34.0)
MCHC: 31.3 g/dL — ABNORMAL LOW (ref 31.5–36.0)
MCV: 94.4 fL (ref 79.5–101.0)
MONO ABS: 0.8 10*3/uL (ref 0.1–0.9)
Monocytes Relative: 13 %
Neutro Abs: 3.6 10*3/uL (ref 1.5–6.5)
Neutrophils Relative %: 59 %
Platelet Count: 257 10*3/uL (ref 145–400)
RBC: 3.38 MIL/uL — AB (ref 3.70–5.45)
RDW: 19.4 % — AB (ref 11.2–14.5)
WBC Count: 6 10*3/uL (ref 3.9–10.3)

## 2018-06-20 LAB — CMP (CANCER CENTER ONLY)
ALBUMIN: 3.3 g/dL — AB (ref 3.5–5.0)
ALT: 12 U/L (ref 0–44)
AST: 14 U/L — ABNORMAL LOW (ref 15–41)
Alkaline Phosphatase: 64 U/L (ref 38–126)
Anion gap: 8 (ref 5–15)
BILIRUBIN TOTAL: 0.3 mg/dL (ref 0.3–1.2)
BUN: 16 mg/dL (ref 8–23)
CHLORIDE: 108 mmol/L (ref 98–111)
CO2: 26 mmol/L (ref 22–32)
Calcium: 9.2 mg/dL (ref 8.9–10.3)
Creatinine: 1.03 mg/dL — ABNORMAL HIGH (ref 0.44–1.00)
GFR, Est AFR Am: >60 mL/min (ref >60)
GFR, Estimated: 55 mL/min — ABNORMAL LOW (ref >60)
GLUCOSE: 199 mg/dL — AB (ref 70–99)
POTASSIUM: 4.1 mmol/L (ref 3.5–5.1)
Sodium: 142 mmol/L (ref 135–145)
Total Protein: 6.8 g/dL (ref 6.5–8.1)

## 2018-06-20 MED ORDER — PEGFILGRASTIM 6 MG/0.6ML ~~LOC~~ PSKT
PREFILLED_SYRINGE | SUBCUTANEOUS | Status: AC
  Filled 2018-06-20: qty 0.6

## 2018-06-20 MED ORDER — SODIUM CHLORIDE 0.9 % IV SOLN
600.0000 mg/m2 | Freq: Once | INTRAVENOUS | Status: AC
  Administered 2018-06-20: 1240 mg via INTRAVENOUS
  Filled 2018-06-20: qty 62

## 2018-06-20 MED ORDER — HEPARIN SOD (PORK) LOCK FLUSH 100 UNIT/ML IV SOLN
500.0000 [IU] | Freq: Once | INTRAVENOUS | Status: AC | PRN
  Administered 2018-06-20: 500 [IU]
  Filled 2018-06-20: qty 5

## 2018-06-20 MED ORDER — SODIUM CHLORIDE 0.9 % IV SOLN
Freq: Once | INTRAVENOUS | Status: AC
  Administered 2018-06-20: 13:00:00 via INTRAVENOUS
  Filled 2018-06-20: qty 5

## 2018-06-20 MED ORDER — SODIUM CHLORIDE 0.9% FLUSH
10.0000 mL | Freq: Once | INTRAVENOUS | Status: AC
  Administered 2018-06-20: 10 mL
  Filled 2018-06-20: qty 10

## 2018-06-20 MED ORDER — PEGFILGRASTIM 6 MG/0.6ML ~~LOC~~ PSKT
6.0000 mg | PREFILLED_SYRINGE | Freq: Once | SUBCUTANEOUS | Status: AC
  Administered 2018-06-20: 6 mg via SUBCUTANEOUS

## 2018-06-20 MED ORDER — PALONOSETRON HCL INJECTION 0.25 MG/5ML
INTRAVENOUS | Status: AC
  Filled 2018-06-20: qty 5

## 2018-06-20 MED ORDER — SODIUM CHLORIDE 0.9% FLUSH
10.0000 mL | INTRAVENOUS | Status: DC | PRN
  Administered 2018-06-20: 10 mL
  Filled 2018-06-20: qty 10

## 2018-06-20 MED ORDER — PALONOSETRON HCL INJECTION 0.25 MG/5ML
0.2500 mg | Freq: Once | INTRAVENOUS | Status: AC
  Administered 2018-06-20: 0.25 mg via INTRAVENOUS

## 2018-06-20 MED ORDER — SODIUM CHLORIDE 0.9 % IV SOLN
60.0000 mg/m2 | Freq: Once | INTRAVENOUS | Status: AC
  Administered 2018-06-20: 120 mg via INTRAVENOUS
  Filled 2018-06-20: qty 12

## 2018-06-20 MED ORDER — SODIUM CHLORIDE 0.9 % IV SOLN
Freq: Once | INTRAVENOUS | Status: AC
  Administered 2018-06-20: 13:00:00 via INTRAVENOUS
  Filled 2018-06-20: qty 250

## 2018-06-20 NOTE — Telephone Encounter (Signed)
Per 9/17 no los °

## 2018-06-20 NOTE — Progress Notes (Signed)
Patient Care Team: Bartholomew Crews, MD as PCP - General (Internal Medicine) Hayden Pedro, MD as Consulting Physician (Ophthalmology) Shirley Muscat Loreen Freud, MD as Referring Physician (Optometry) Minus Breeding, MD as Consulting Physician (Cardiology)  DIAGNOSIS:  Encounter Diagnosis  Name Primary?  . 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 S/p lumpectomy and radiation.    03/21/2018 Initial Diagnosis    Peau d' Orange skin anterior left breast: Ultrasound 7 o'clock position 2 cm from nipple irregular hypoechoic mass 2.2 x 2.5 x 2.5 cm (1 cm medial to prior lumpectomy); 12:00: Hypoechoic mass extending to skin 3.9 x 2.9 x 3.6 cm market skin thickening, no axillary lymph nodes    03/21/2018 Initial Biopsy    Left breast 12 o'clock position: IDC grade 3; 7 o'clock position: IDC grade 3, ER 40%, PR 0%, Ki-67 80%, HER-2 equivocal by FISH ratio 1.17 copy #4.4, IHC 1+ negative, T4d N0 stage IIIc clinical stage     04/20/2018 Imaging    IMPRESSION: 1. Multiple masses within the left breast (inflammatory breast carcinoma.) 2. Cortically thickened right axillary lymph node, indeterminate. Metastatic disease not excluded (patient refused biopsy)    05/09/2018 -  Neo-Adjuvant Chemotherapy    Neoadjuvant chemotherapy with TC X 4     CHIEF COMPLIANT: Cycle 4 neoadjuvant chemotherapy with Taxotere Cytoxan  INTERVAL HISTORY: [name] is a 89-year with above-mentioned history of left breast cancer with peau de orange appearance who underwent neoadjuvant chemotherapy today cycle 4 of Taxotere and Cytoxan.  Overall she tolerated chemo reasonably well.  She did complain of bone pain related to Neulasta.  She  had to take Ultram and Claritin which appears to have helped her.  She did not have any nausea or vomiting.  She had complete alopecia.  REVIEW OF SYSTEMS:   Constitutional: Denies fevers, chills or abnormal weight loss Eyes: Denies blurriness of vision Ears, nose, mouth, throat, and face: Denies mucositis or sore throat Respiratory: Denies cough, dyspnea or wheezes Cardiovascular: Denies palpitation, chest discomfort Gastrointestinal:  Denies nausea, heartburn or change in bowel habits Skin: Denies abnormal skin rashes Lymphatics: Denies new lymphadenopathy or easy bruising Neurological:Denies numbness, tingling or new weaknesses Behavioral/Psych: Mood is stable, no new changes  Extremities: No lower extremity edema   All other systems were reviewed with the patient and are negative.  I have reviewed the past medical history, past surgical history, social history and family history with the patient and they are unchanged from previous note.  ALLERGIES:  is allergic to lisinopril; oxycodone-acetaminophen; victoza [liraglutide]; and atorvastatin.  MEDICATIONS:  Current Outpatient Medications  Medication Sig Dispense Refill  . ACCU-CHEK FASTCLIX LANCETS MISC Check blood sugar 3 times a day 102 each 5  . amLODipine (NORVASC) 10 MG tablet TAKE ONE (1) TABLET BY MOUTH EVERY DAY 90 tablet 3  . aspirin EC 81 MG tablet Take 81 mg by mouth daily.    . cetirizine (ZYRTEC) 10 MG tablet Take 10 mg as needed by mouth for allergies.    . chlorpheniramine-HYDROcodone (TUSSIONEX PENNKINETIC ER) 10-8 MG/5ML SUER Take 5 mLs by mouth 2 (two) times daily. 140 mL 0  . Coenzyme Q10 (CO Q 10 PO) Take 50 mg by mouth daily.     Marland Kitchen  dexamethasone (DECADRON) 4 MG tablet Take 1 tablet (4 mg total) by mouth daily. Take 1 tablet day before chemo and 1 tablet day after chemo 10 tablet 0  . ezetimibe (ZETIA) 10 MG tablet TAKE ONE (1) TABLET BY MOUTH EVERY DAY 30 tablet 7  . furosemide (LASIX) 20 MG tablet Take 1  tablet (20 mg total) by mouth as needed for edema. 30 tablet 0  . gabapentin (NEURONTIN) 300 MG capsule Take 1 capsule (300 mg total) by mouth at bedtime. 30 capsule 2  . glucose blood (ACCU-CHEK SMARTVIEW) test strip Check blood sugar 3 times a day 100 each 5  . insulin aspart (NOVOLOG FLEXPEN) 100 UNIT/ML FlexPen . If 201- 250 inject 2 units,251- 300 inject 3 units,301- 400  inject 4 units,> 400 inject 5 units, call office. 15 mL 1  . Insulin Glargine (TOUJEO SOLOSTAR) 300 UNIT/ML SOPN Inject 35 Units into the skin daily. 12 mL 3  . Insulin Pen Needle 32G X 4 MM MISC USE FOR INJECTIONS up to 4 times daily. Diagnosis code E11.40, Z79.4 200 each 5  . isosorbide mononitrate (IMDUR) 120 MG 24 hr tablet Take 2 tablets (240 mg total) by mouth every morning. (Patient taking differently: Take 240 mg by mouth daily. ) 30 tablet 11  . isosorbide mononitrate (IMDUR) 120 MG 24 hr tablet TAKE TWO (2) TABLETS BY MOUTH EVERY MORNING 60 tablet 6  . lidocaine-prilocaine (EMLA) cream Apply to affected area once 30 g 3  . losartan (COZAAR) 50 MG tablet TAKE ONE (1) TABLET BY MOUTH EVERY DAY 90 tablet 3  . Lutein 6 MG TABS Take 6 mg by mouth daily.    . meloxicam (MOBIC) 7.5 MG tablet TAKE 1 TABLET BY MOUTH ONCE DAILY 30 tablet 2  . metFORMIN (GLUCOPHAGE) 1000 MG tablet TAKE ONE (1) TABLET BY MOUTH TWO (2) TIMES DAILY WITH A MEAL 180 tablet 3  . metoprolol succinate (TOPROL-XL) 100 MG 24 hr tablet TAKE ONE (1) TABLET BY MOUTH EVERY DAY 90 tablet 3  . nitroGLYCERIN (NITRODUR - DOSED IN MG/24 HR) 0.4 mg/hr patch APPLY ONE PATCH ON SKIN EVERY DAY. LEAVE ON FOR 12 HOURS AND OFF FOR 12 HOURS. (Patient not taking: Reported on 04/25/2018) 100 patch 3  . nitroGLYCERIN (NITROSTAT) 0.4 MG SL tablet PLACE 1 TABLET UNDER THE TONGUE EVERY 5 MINUTES AS NEEDED FOR CHEST PAIN (Patient not taking: Reported on 04/25/2018) 25 tablet 7  . Olopatadine HCl (PATADAY) 0.2 % SOLN Place 1 drop into both eyes daily as needed. For allergies    .  omeprazole (PRILOSEC) 40 MG capsule Take 1 capsule (40 mg total) by mouth daily. 30 capsule 5  . ondansetron (ZOFRAN) 8 MG tablet Take 1 tablet (8 mg total) by mouth 2 (two) times daily as needed for refractory nausea / vomiting. Start on day 3 after chemo. 30 tablet 1  . OVER THE COUNTER MEDICATION Take 1 tablet by mouth daily. SUPER VITAMIN B-COMPLEX (Vitamin B6 and B12)    . prochlorperazine (COMPAZINE) 10 MG tablet Take 1 tablet (10 mg total) by mouth every 6 (six) hours as needed (Nausea or vomiting). 60 tablet 2  . repaglinide (PRANDIN) 2 MG tablet Take 2 tablets (4 mg total) by mouth daily with breakfast AND 2 tablets (4 mg total) daily with lunch AND 2 tablets (4 mg total) daily with supper. 270 tablet 3  . spironolactone (ALDACTONE) 25 MG tablet Take 1 tablet (25 mg total) by mouth daily. 90 tablet 3  . traMADol (  ULTRAM) 50 MG tablet Take 1 tablet (50 mg total) by mouth every 12 (twelve) hours as needed. 30 tablet 0  . Turmeric Curcumin 500 MG CAPS Take 500 mg by mouth daily.     No current facility-administered medications for this visit.     PHYSICAL EXAMINATION: ECOG PERFORMANCE STATUS: 1 - Symptomatic but completely ambulatory  Vitals:   06/20/18 1142  BP: 115/60  Pulse: (!) 101  Resp: 18  Temp: 98.7 F (37.1 C)  SpO2: 99%   Filed Weights   06/20/18 1142  Weight: 223 lb 14.4 oz (101.6 kg)    GENERAL:alert, no distress and comfortable SKIN: skin color, texture, turgor are normal, no rashes or significant lesions EYES: normal, Conjunctiva are pink and non-injected, sclera clear OROPHARYNX:no exudate, no erythema and lips, buccal mucosa, and tongue normal  NECK: supple, thyroid normal size, non-tender, without nodularity LYMPH:  no palpable lymphadenopathy in the cervical, axillary or inguinal LUNGS: clear to auscultation and percussion with normal breathing effort HEART: regular rate & rhythm and no murmurs and no lower extremity edema ABDOMEN:abdomen soft, non-tender  and normal bowel sounds MUSCULOSKELETAL:no cyanosis of digits and no clubbing  NEURO: alert & oriented x 3 with fluent speech, no focal motor/sensory deficits EXTREMITIES: No lower extremity edema   LABORATORY DATA:  I have reviewed the data as listed CMP Latest Ref Rng & Units 06/13/2018 05/30/2018 05/17/2018  Glucose 65 - 99 mg/dL 166(H) 366(H) 181(H)  BUN 8 - 27 mg/dL 16 24(H) 17  Creatinine 0.57 - 1.00 mg/dL 0.86 1.06(H) 0.95  Sodium 134 - 144 mmol/L 141 139 142  Potassium 3.5 - 5.2 mmol/L 4.1 4.2 4.0  Chloride 96 - 106 mmol/L 105 106 106  CO2 20 - 29 mmol/L _0 Calcium 8.7 - 10.3 mg/dL 8.9 9.9 9.3  Total Protein 6.0 - 8.5 g/dL 6.7 7.0 6.9  Total Bilirubin 0.0 - 1.2 mg/dL <0.2 <0.2(L) 0.2(L)  Alkaline Phos 39 - 117 IU/L 80 99 114  AST 0 - 40 IU/L 16 13(L) 24  ALT 0 - 32 IU/L _1 Lab Results  Component Value Date   WBC 6.0 06/20/2018   HGB 10.0 (L) 06/20/2018   HCT 31.9 (L) 06/20/2018   MCV 94.4 06/20/2018   PLT 257 06/20/2018   NEUTROABS 3.6 06/20/2018    ASSESSMENT & PLAN:  Malignant neoplasm of lower-inner quadrant of left breast in female, estrogen receptor positive ([place]) 2006 June : 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 S/p lumpectomy and radiation.  03/21/2018:Peau d' Orange skin anterior left breast: Ultrasound 7 o'clock position 2 cm from nipple irregular hypoechoic mass 2.2 x 2.5 x 2.5 cm (1 cm medial to prior lumpectomy); 12:00: Hypoechoic mass extending to skin 3.9 x 2.9 x 3.6 cm market skin thickening, no axillary lymph nodes  Left breast 12 o'clock position: IDC grade 3; 7 o'clock position: IDC grade 3, ER 40%, PR 0%, Ki-67 80%, HER-2 equivocal by FISH ratio 1.17 copy #4.4, IHC 1+ negative, T4d N0 stage IIIc clinical stage  Right breast and axillary ultrasound: Prominent right axillary lymph node.   Biopsy revealed metastatic poorly differentiated carcinoma ER 0%, PR 0%, Ki-67 90%  CT CAP 04/20/18: Breast masses and  axillary lymph node, no metastatic disease elsewhere ----------------------------------------------------------------------------- Treatment plan: 1. Neoadjuvant chemotherapy with Taxotere Cytoxan x 4 cycles 2. followed by mastectomy 3. Followed by radiation 4. Followed by antiestrogen therapy ---------------------------------------------------------------------------- Current treatment:Cycle4day 1 Taxotereand Cytoxan  Labs have  been reviewed Chemo toxicities: 1.Nausea and vomiting: Resolved 2.chemo induced anemia: Being monitored, patient is a Jehovah's Witness 3.  Chemo-induced peripheral neuropathy: We decreased the dosage of Taxotere with cycle 3  Diabetes mellitus: Because of rising blood sugars I discontinue dexamethasone.  Plan: Breast MRi and she has a follow-up with Dr. Marlou Starks I will see the patient after the breast surgery.   No orders of the defined types were placed in this encounter.  The patient has a good understanding of the overall plan. she agrees with it. she will call with any problems that may develop before the next visit here.   Harriette Ohara, MD 06/20/18

## 2018-06-21 ENCOUNTER — Telehealth: Payer: Self-pay | Admitting: *Deleted

## 2018-06-21 ENCOUNTER — Ambulatory Visit
Admission: RE | Admit: 2018-06-21 | Discharge: 2018-06-21 | Disposition: A | Discharge status: Home / Self Care | Payer: Medicare Other | Source: Ambulatory Visit | Attending: Hematology and Oncology | Admitting: Hematology and Oncology

## 2018-06-21 DIAGNOSIS — C50312 Malignant neoplasm of lower-inner quadrant of left female breast: Secondary | ICD-10-CM

## 2018-06-21 DIAGNOSIS — Z170 Estrogen receptor positive status [ER+]: Principal | ICD-10-CM

## 2018-06-21 NOTE — Telephone Encounter (Signed)
FYI "Clarise Cruz, [facility].  [name] has told us to remove her injection to perform MRI.  Can the device be removed for MRI test?  Does the patient know how to remove it?"  Do Not Remove ON-PRO.  Device was placed 06-20-2018 at 1522.  Injection begins automatically within 24 hours over one hour.  Removed by patient or family an hour after completion to ensure no product failure or leakage.   MRI to be rescheduled 06-22-2108 at 1:30 pm.  Clarise Cruz asked to have [name] call [facility] early am if product fails.

## 2018-06-22 ENCOUNTER — Ambulatory Visit
Admission: RE | Admit: 2018-06-22 | Discharge: 2018-06-22 | Disposition: A | Discharge status: Home / Self Care | Payer: Medicare Other | Source: Ambulatory Visit | Attending: Hematology and Oncology | Admitting: Hematology and Oncology

## 2018-06-22 ENCOUNTER — Ambulatory Visit: Payer: Self-pay | Admitting: General Surgery

## 2018-06-22 DIAGNOSIS — C50112 Malignant neoplasm of central portion of left female breast: Secondary | ICD-10-CM | POA: Diagnosis not present

## 2018-06-22 DIAGNOSIS — N6321 Unspecified lump in the left breast, upper outer quadrant: Secondary | ICD-10-CM | POA: Diagnosis not present

## 2018-06-22 DIAGNOSIS — Z170 Estrogen receptor positive status [ER+]: Secondary | ICD-10-CM | POA: Diagnosis not present

## 2018-06-22 DIAGNOSIS — N6322 Unspecified lump in the left breast, upper inner quadrant: Secondary | ICD-10-CM | POA: Diagnosis not present

## 2018-06-22 MED ORDER — GADOBENATE DIMEGLUMINE 529 MG/ML IV SOLN
20.0000 mL | Freq: Once | INTRAVENOUS | Status: AC | PRN
  Administered 2018-06-22: 20 mL via INTRAVENOUS

## 2018-06-23 ENCOUNTER — Ambulatory Visit (INDEPENDENT_AMBULATORY_CARE_PROVIDER_SITE_OTHER): Payer: Medicare Other | Admitting: Dietician

## 2018-06-23 ENCOUNTER — Encounter: Payer: Self-pay | Admitting: Dietician

## 2018-06-23 ENCOUNTER — Other Ambulatory Visit: Payer: Self-pay

## 2018-06-23 ENCOUNTER — Ambulatory Visit (INDEPENDENT_AMBULATORY_CARE_PROVIDER_SITE_OTHER): Payer: Medicare Other | Admitting: Internal Medicine

## 2018-06-23 ENCOUNTER — Encounter: Payer: Self-pay | Admitting: Internal Medicine

## 2018-06-23 VITALS — BP 126/59 | HR 84 | Temp 97.9°F | Ht 61.0 in | Wt 224.6 lb

## 2018-06-23 DIAGNOSIS — I2510 Atherosclerotic heart disease of native coronary artery without angina pectoris: Secondary | ICD-10-CM | POA: Diagnosis not present

## 2018-06-23 DIAGNOSIS — C50912 Malignant neoplasm of unspecified site of left female breast: Secondary | ICD-10-CM

## 2018-06-23 DIAGNOSIS — I872 Venous insufficiency (chronic) (peripheral): Secondary | ICD-10-CM

## 2018-06-23 DIAGNOSIS — R600 Localized edema: Secondary | ICD-10-CM

## 2018-06-23 DIAGNOSIS — E1140 Type 2 diabetes mellitus with diabetic neuropathy, unspecified: Secondary | ICD-10-CM

## 2018-06-23 DIAGNOSIS — G4733 Obstructive sleep apnea (adult) (pediatric): Secondary | ICD-10-CM

## 2018-06-23 DIAGNOSIS — Z923 Personal history of irradiation: Secondary | ICD-10-CM | POA: Diagnosis not present

## 2018-06-23 DIAGNOSIS — Z79899 Other long term (current) drug therapy: Secondary | ICD-10-CM

## 2018-06-23 DIAGNOSIS — Z794 Long term (current) use of insulin: Secondary | ICD-10-CM

## 2018-06-23 DIAGNOSIS — Z7982 Long term (current) use of aspirin: Secondary | ICD-10-CM

## 2018-06-23 DIAGNOSIS — I509 Heart failure, unspecified: Secondary | ICD-10-CM

## 2018-06-23 DIAGNOSIS — R0602 Shortness of breath: Secondary | ICD-10-CM

## 2018-06-23 DIAGNOSIS — Z713 Dietary counseling and surveillance: Secondary | ICD-10-CM | POA: Diagnosis not present

## 2018-06-23 DIAGNOSIS — Z9889 Other specified postprocedural states: Secondary | ICD-10-CM | POA: Diagnosis not present

## 2018-06-23 DIAGNOSIS — E118 Type 2 diabetes mellitus with unspecified complications: Secondary | ICD-10-CM

## 2018-06-23 NOTE — Patient Instructions (Signed)
Please take 4 units of your insulin with every meal. You will also add an additional number of insulin based on your blood sugar readings (just like the sliding scale that you have been using before).   We will see you in 1 week to go over your glucose readings.

## 2018-06-23 NOTE — Assessment & Plan Note (Signed)
Assessment: [name] blood sugars have been uncontrolled while taking chemotherapy (average 214 over the last month). Her current insulin regiment includes Toujeo 35 units daily, Novolog SSI, Metformin 1000 mg QD, and repaglinide 4 mg TID.   Plan: 1. CGM placed today 2. Add Novolog 4 units with every meal along with the SSI. Continue Toujeo 35 units daily, Metformin 1000 mg QD, and repaglinide 4 mg TID.  3. Follow-up in 1 week to read CGM on new regiment

## 2018-06-23 NOTE — Progress Notes (Signed)
Documentation for Freestyle Libre Pro Continuous glucose monitoring Freestyle Libre Pro CGM sensor placed and started on [name] who was identified by name and date of birth.  Patient was educated about wearing sensor, keeping food, activity and medication log and when to call office.She was educated about how to care for the sensor and not to have an MRI, CT or Diathermy while wearing the sensorwhat she can learn from the Continuous glucose monitoring. Follow up was arranged with the patient for 1 week to see a doctor and diabetes educator.  Lot #:[id number] Serial #:[id number] Expiration Date:04/02/2018 [name], RD 06/23/2018 10:16 AM.

## 2018-06-23 NOTE — Patient Instructions (Signed)
Please record the time, amount and what food drinks and activities you have while wearing the continuous glucose monitor(CGM).  Bring the folder with you to follow up appointments  Do not have a CT or an MRI while wearing the CGM.   Please make an appointment for 1 week with me and a doctor for the first of two CGM downloads..   You will also return in 2 weeks to have your second download and the CGM remove  Donna [phone] 

## 2018-06-23 NOTE — Progress Notes (Signed)
CC: Follow-up of LE edema  HPI:  [name] is a 67 y.o. female with ductal carcinoma of the breast (completed 3/4 rounds of taxotere cytoxan), T2DM, CHF (last Echo 08/2017 EF 09-81%; grade I diastolic dysfunction), OSA on CPAP, CAD, and chronic venous insufficiency who presents for follow-up of bilateral LE edema and new CGM placement.  Bilateral LE Edema [name] was seen in clinic 10 days ago for bilateral LE edema. Her symptoms were concerning for exacerbation of her chronic venous insufficiency, acute on chronic CHF exacerbation, and chemotherapy-induced cardiomyopathy. A repeat Echo was ordered and is schedule for 06/28/18 to have heart function evaluated.  She also was prescribed Lasix 20 mg WD PRN edema and recommended using compression stockings. She reports improvement of bilateraly LE edema while on Lasix. She denies SOB and chest pain.   T2DM [name] reports uncontrolled blood sugars during cancer treatment. Her current diabetic regiment includes Toujeo 35 units daily, Novolog SSI, Metformin 1000 mg QD, and repaglinide 4 mg TID. Her blood sugar per meter readings remain elevated throughout the day with average of 214 (highest 350, lowest 126).    Past Medical History:  Diagnosis Date  . Arthritis    "knees, lower back" (08/18/2017)  . 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 S/p lumpectomy and radiation.  Marland Kitchen CAD (coronary artery disease) 11/07   cath 11/11 :  3V CADsee report   . Cataract    small both eyes  . Chronic lower back pain   . Chronic pain    MR 08/07/10 :  Spondylosis L4-5 with B foraminal narrowing and L4 nerve root enchroachment B.  . Chronic venous insufficiency 2012   Has had full W/U incl ECHO, LFT's, Cr, Umicro, TSH, BNP. Symptomatic treatment.  . Colonic polyp 1995   Colonoscopies 1994, 2000, and 02/02/2011. Last had 9 polyps - Tubular adenoma and tubulovillous was the pathology results without  high grade dysplasia  . Diabetes mellitus type II    Insulin dependent. Has worked with [name] R previously.  . Diastolic CHF, chronic ([place])    NL EF by echo 01/2012, Gr I dd  . Essential hypertension    Requires 5 drug therapy and still difficult to control.  Marland Kitchen GERD (gastroesophageal reflux disease)   . Heart murmur   . Hyperlipidemia    On daily statin  . Iron deficiency anemia    Ferritin was 12 in 2012. on FeSo4. Has had colon and EGD 02/02/11 that showed mild gastritis and colon polyps.  . Obesity    Morbid. HAs worked with [name] T previously.  . OSA (obstructive sleep apnea) 02/16/2007   02/2007 : Moderate AHI 35.6, O2 sat decreased to 65%, CPAP titration to 16 with AHI of 3.6. 02/2014 : Rare respiratory events with sleep disturbance, within normal limits. AHI 0.4 per hour      . OSA on CPAP   . Personal history of chemotherapy   . Personal history of radiation therapy    "to my left breast"  . Refusal of blood transfusions as patient is Jehovah's Witness   . Seasonal allergies    "grass pollen"   Review of Systems:   Review of Systems  Constitutional: Negative for chills and fever.  HENT: Negative for congestion, sinus pain and sore throat.   Respiratory: Negative for cough and shortness of breath.   Cardiovascular: Positive for leg swelling (Bilateral LE edema.). Negative for chest pain and  palpitations.  Gastrointestinal: Negative for abdominal pain, diarrhea, nausea and vomiting.  Genitourinary: Negative for dysuria, frequency and urgency.  All other systems reviewed and are negative.   Physical Exam:  Vitals:   06/23/18 0947  BP: (!) 126/59  Pulse: 84  Temp: 97.9 F (36.6 C)  TempSrc: Oral  SpO2: 98%  Weight: 224 lb 9.6 oz (101.9 kg)  Height: 5\' 1"  (1.549 m)   Physical Exam  Constitutional: She is well-developed, well-nourished, and in no distress.  HENT:  Head: Normocephalic and atraumatic.  Eyes: EOM are normal.  Neck: Normal range of motion.    Cardiovascular: Normal rate and regular rhythm.  Pulmonary/Chest: Effort normal and breath sounds normal.  Musculoskeletal: She exhibits edema (1+ non-pitting edema of the LE bilaterally (up to the mid-calf). Much improved from last week.).  Neurological: She is alert.  Skin: Skin is warm and dry.  Psychiatric: Affect normal.  Nursing note and vitals reviewed.   Assessment & Plan:   See Encounters Tab for problem based charting.  Patient seen with Dr. Angelia Mould

## 2018-06-23 NOTE — Assessment & Plan Note (Signed)
Assessment: I still believe that her bilateral LE edema is secondary to chronic venous insufficiency vs worsening CHF while on chemotherapy. We will await ECHO results from 06/28/18 to evaluate her heart function. Today her LE edema is much improved while on Lasix 20 mg PRN and compression stocking use.   Plan: 1. Continue Lasix 20 mg PRN leg swelling and compression stocking use 2. Repeat BMP to evaluate renal function while on Lasix

## 2018-06-23 NOTE — Assessment & Plan Note (Addendum)
Assessment: [name] SOB is now resolved after taking Lasix 20 mg QD PRN. She has an ECHO schedule for next week to evaluate heart function.  Plan: 1. Continue to monitor

## 2018-06-24 LAB — BMP8+ANION GAP
ANION GAP: 15 mmol/L (ref 10.0–18.0)
BUN/Creatinine Ratio: 19 (ref 12–28)
BUN: 16 mg/dL (ref 8–27)
CALCIUM: 9 mg/dL (ref 8.7–10.3)
CO2: 23 mmol/L (ref 20–29)
CREATININE: 0.84 mg/dL (ref 0.57–1.00)
Chloride: 107 mmol/L — ABNORMAL HIGH (ref 96–106)
GFR calc Af Amer: 83 mL/min/{1.73_m2} (ref >59)
GFR, EST NON AFRICAN AMERICAN: 72 mL/min/{1.73_m2} (ref >59)
Glucose: 142 mg/dL — ABNORMAL HIGH (ref 65–99)
Potassium: 4 mmol/L (ref 3.5–5.2)
SODIUM: 145 mmol/L — AB (ref 134–144)

## 2018-06-26 NOTE — Progress Notes (Signed)
Internal Medicine Clinic Attending  I saw and evaluated the patient.  I personally confirmed the key portions of the history and exam documented by Dr. Prince and I reviewed pertinent patient test results.  The assessment, diagnosis, and plan were formulated together and I agree with the documentation in the resident's note.  

## 2018-06-27 ENCOUNTER — Telehealth: Payer: Self-pay | Admitting: Hematology and Oncology

## 2018-06-27 ENCOUNTER — Telehealth: Payer: Self-pay | Admitting: Internal Medicine

## 2018-06-27 NOTE — Telephone Encounter (Signed)
Scheduled appt per 9/24 sch message - sent reminder letter in the mail with appt date and time.   

## 2018-06-27 NOTE — Telephone Encounter (Signed)
I called [name] to notify her of the lab results. Her BMP remains unremarkable. She expressed understanding and did not have any questions.

## 2018-06-28 ENCOUNTER — Ambulatory Visit (HOSPITAL_COMMUNITY)
Admission: RE | Admit: 2018-06-28 | Discharge: 2018-06-28 | Disposition: A | Discharge status: Home / Self Care | Payer: Medicare Other | Source: Ambulatory Visit | Attending: Internal Medicine | Admitting: Internal Medicine

## 2018-06-28 DIAGNOSIS — E119 Type 2 diabetes mellitus without complications: Secondary | ICD-10-CM | POA: Diagnosis not present

## 2018-06-28 DIAGNOSIS — E785 Hyperlipidemia, unspecified: Secondary | ICD-10-CM | POA: Insufficient documentation

## 2018-06-28 DIAGNOSIS — R011 Cardiac murmur, unspecified: Secondary | ICD-10-CM | POA: Insufficient documentation

## 2018-06-28 DIAGNOSIS — I5032 Chronic diastolic (congestive) heart failure: Secondary | ICD-10-CM

## 2018-06-28 DIAGNOSIS — I110 Hypertensive heart disease with heart failure: Secondary | ICD-10-CM | POA: Insufficient documentation

## 2018-06-28 DIAGNOSIS — I059 Rheumatic mitral valve disease, unspecified: Secondary | ICD-10-CM | POA: Diagnosis not present

## 2018-06-28 DIAGNOSIS — I2510 Atherosclerotic heart disease of native coronary artery without angina pectoris: Secondary | ICD-10-CM | POA: Insufficient documentation

## 2018-06-28 NOTE — Progress Notes (Signed)
  Echocardiogram 2D Echocardiogram has been performed.  Jannett Celestine 06/28/2018, 10:19 AM

## 2018-06-30 ENCOUNTER — Encounter: Payer: Self-pay | Admitting: Dietician

## 2018-06-30 ENCOUNTER — Other Ambulatory Visit: Payer: Self-pay | Admitting: Internal Medicine

## 2018-06-30 ENCOUNTER — Other Ambulatory Visit: Payer: Self-pay

## 2018-06-30 ENCOUNTER — Ambulatory Visit (INDEPENDENT_AMBULATORY_CARE_PROVIDER_SITE_OTHER): Payer: Medicare Other | Admitting: Internal Medicine

## 2018-06-30 ENCOUNTER — Ambulatory Visit: Payer: Medicare Other | Admitting: Dietician

## 2018-06-30 DIAGNOSIS — Z7982 Long term (current) use of aspirin: Secondary | ICD-10-CM | POA: Diagnosis not present

## 2018-06-30 DIAGNOSIS — E1140 Type 2 diabetes mellitus with diabetic neuropathy, unspecified: Secondary | ICD-10-CM

## 2018-06-30 DIAGNOSIS — E119 Type 2 diabetes mellitus without complications: Secondary | ICD-10-CM | POA: Diagnosis not present

## 2018-06-30 DIAGNOSIS — R011 Cardiac murmur, unspecified: Secondary | ICD-10-CM

## 2018-06-30 DIAGNOSIS — Z79899 Other long term (current) drug therapy: Secondary | ICD-10-CM | POA: Diagnosis not present

## 2018-06-30 DIAGNOSIS — Z794 Long term (current) use of insulin: Secondary | ICD-10-CM

## 2018-06-30 MED ORDER — GLUCOSE BLOOD VI STRP: ORAL_STRIP | 12 refills | Status: DC

## 2018-06-30 MED ORDER — INSULIN ASPART 100 UNIT/ML FLEXPEN: PEN_INJECTOR | SUBCUTANEOUS | 1 refills | Status: DC

## 2018-06-30 MED ORDER — INSULIN GLARGINE 300 UNIT/ML ~~LOC~~ SOPN: 30.0000 [IU] | PEN_INJECTOR | Freq: Every day | SUBCUTANEOUS | 3 refills | Status: DC

## 2018-06-30 NOTE — Progress Notes (Signed)
Documentation: assisted with CGM download #1. Discussed download and Ms. Kearse's food, activity and medicine record with both she and Dr. Jari Favre. A new meter Accu chek guide was provided to Ms. Stofer today as her meter seems to be reading consistently and significantly higher than the cgm.  Mrs. Dever report taking the following diabetes medicines:  2 mg prandin 3-4 times a day weigh meals, 35 units Toujeo each morning, metformin twice a day and Novolog with meals and sometimes due to high blood sugar after meals according to her correction scale.  Plan is to follow up in 1 week [name], RD 06/30/2018 1:58 PM.

## 2018-06-30 NOTE — Patient Instructions (Signed)
For your diabetes: Stop repaglinide Decrease Toujeo to 30 units every day Use the below sliding scale for short acting insulin with meals: If 150-199 inject 2 units,200-250 inject 3 units,251- 350 inject 4 units,351-400 inject 5 units, call office.  Come back in 1wk for your final download. Let us know if you have really high sugars or really low sugars before then.

## 2018-06-30 NOTE — Progress Notes (Signed)
   CC: diabetes  HPI:  [name] is a 67 y.o. with a PMH listed below presenting to clinic for follow up on diabetes and CGM.  Patient's current regimen is Toujeo 35 units daily,metformin 1000mg  BID, repaglinide 4mg  TID with meals; she is also supposed to be on 4 units Novolog + sliding scale with meals but has only been using sliding scale. She endorses some nausea with her chemo (gotten this week) that is well controlled with antiemetics; denies hypoglycemic episodes or symptoms including shakiness, weakness, blurry vision.  Please see problem based Assessment and Plan for status of patients chronic conditions.  Past Medical History:  Diagnosis Date  . Arthritis    "knees, lower back" (08/18/2017)  . 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 S/p lumpectomy and radiation.  Marland Kitchen CAD (coronary artery disease) 11/07   cath 11/11 :  3V CADsee report   . Cataract    small both eyes  . Chronic lower back pain   . Chronic pain    MR 08/07/10 :  Spondylosis L4-5 with B foraminal narrowing and L4 nerve root enchroachment B.  . Chronic venous insufficiency 2012   Has had full W/U incl ECHO, LFT's, Cr, Umicro, TSH, BNP. Symptomatic treatment.  . Colonic polyp 1995   Colonoscopies 1994, 2000, and 02/02/2011. Last had 9 polyps - Tubular adenoma and tubulovillous was the pathology results without high grade dysplasia  . Diabetes mellitus type II    Insulin dependent. Has worked with [name] R previously.  . Diastolic CHF, chronic ([place])    NL EF by echo 01/2012, Gr I dd  . Essential hypertension    Requires 5 drug therapy and still difficult to control.  Marland Kitchen GERD (gastroesophageal reflux disease)   . Heart murmur   . Hyperlipidemia    On daily statin  . Iron deficiency anemia    Ferritin was 12 in 2012. on FeSo4. Has had colon and EGD 02/02/11 that showed mild gastritis and colon polyps.  . Obesity    Morbid. HAs worked with [name] T previously.   . OSA (obstructive sleep apnea) 02/16/2007   02/2007 : Moderate AHI 35.6, O2 sat decreased to 65%, CPAP titration to 16 with AHI of 3.6. 02/2014 : Rare respiratory events with sleep disturbance, within normal limits. AHI 0.4 per hour      . OSA on CPAP   . Personal history of chemotherapy   . Personal history of radiation therapy    "to my left breast"  . Refusal of blood transfusions as patient is Jehovah's Witness   . Seasonal allergies    "grass pollen"    Review of Systems:   Per HPI  Physical Exam:  Vitals:   06/30/18 1011  BP: 120/64  Pulse: 87  Temp: 98 F (36.7 C)  TempSrc: Oral  SpO2: 99%  Weight: 225 lb 8 oz (102.3 kg)  Height: 5\' 1"  (1.549 m)   GENERAL- alert, co-operative, appears as stated age, not in any distress. HEENT- oral mucosa appears moist. CARDIAC- RRR, systolic murmur best heard at RUSB, no rubs or gallops. RESP- Moving equal volumes of air, and clear to auscultation bilaterally, no wheezes or crackles. ABDOMEN- Soft, nontender, bowel sounds present. SKIN- Warm, dry  Assessment & Plan:   See Encounters Tab for problem based charting.   Patient discussed with Dr. Abelardo Diesel, MD Internal Medicine PGY-3

## 2018-07-01 ENCOUNTER — Encounter: Payer: Self-pay | Admitting: Internal Medicine

## 2018-07-01 NOTE — Assessment & Plan Note (Addendum)
[name] wore the CGM for 8 days. The average reading was 137, % time in target was 87, % time below target was 2, and % time above target was. 11. Intervention will be to continue metformin 1000mg  BID, decrease toujeo to 30 units daily (due to overall risk of overnight and early AM hypoglycemia), stop repaglinide, and cover meals with an adjusted sliding scale for Novolog. The patient will be scheduled to see physician and DM educator for a final appointment.   Patient was noted to have discrepancies between the CGM and her personal glucometer which trended consistently higher averaged glucose readings. Since she has had the glucometer for some years now, it is possible that it is not calibrated well anymore. She was given a script for a new meter; at her follow up visit we will compare again the CGM readings with her new meter CBG checks.

## 2018-07-03 ENCOUNTER — Telehealth: Payer: Self-pay | Admitting: Dietician

## 2018-07-03 NOTE — Progress Notes (Signed)
Internal Medicine Clinic Attending  Case discussed with Dr. Svalina  at the time of the visit.  We reviewed the resident's history and exam and pertinent patient test results.  I agree with the assessment, diagnosis, and plan of care documented in the resident's note.  

## 2018-07-03 NOTE — Telephone Encounter (Signed)
Ms Philbin likes her new blood glucose meter. She needs strips, [facility] dos not have the order. Informed her it was sent today. Her blood sugars are better:  9/28 149(530PM)          225(8346IT)          947(1252 PM)           144(344 AM)

## 2018-07-06 ENCOUNTER — Ambulatory Visit (INDEPENDENT_AMBULATORY_CARE_PROVIDER_SITE_OTHER): Payer: Medicare Other | Admitting: Plastic Surgery

## 2018-07-06 ENCOUNTER — Encounter: Payer: Self-pay | Admitting: Plastic Surgery

## 2018-07-06 DIAGNOSIS — C50112 Malignant neoplasm of central portion of left female breast: Secondary | ICD-10-CM

## 2018-07-06 DIAGNOSIS — G629 Polyneuropathy, unspecified: Secondary | ICD-10-CM | POA: Diagnosis not present

## 2018-07-06 DIAGNOSIS — I208 Other forms of angina pectoris: Secondary | ICD-10-CM

## 2018-07-06 DIAGNOSIS — C50312 Malignant neoplasm of lower-inner quadrant of left female breast: Secondary | ICD-10-CM

## 2018-07-06 DIAGNOSIS — Z794 Long term (current) use of insulin: Secondary | ICD-10-CM

## 2018-07-06 DIAGNOSIS — E1140 Type 2 diabetes mellitus with diabetic neuropathy, unspecified: Secondary | ICD-10-CM

## 2018-07-06 DIAGNOSIS — Z170 Estrogen receptor positive status [ER+]: Secondary | ICD-10-CM

## 2018-07-06 NOTE — Progress Notes (Signed)
Patient ID: [name], female    DOB: 12-19-1950, 67 y.o.   MRN: [id number]   Chief Complaint  Patient presents with  . Breast Cancer    The patient is a 67 year old bf here with her daughter for evaluation of her left breast cancer.  She noticed some blistering on the left breast in the central and medial portion 4 months ago.  She was concerned about the area as it itched severely.  She thought it might be shingles.  She was seen by her PCP and advised to wait and watch.  According to the patient it seems that there was some miscommunication on further follow-up and evaluation.  In the end the patient went for a mammogram and biopsy.  The results were positive for malignant neoplasm of the central portion of the left breast.  It was estrogen receptor positive.  She also has positive right lymph nodes.  Since May the area is involving a significant portion of the left breast.  She has a port-a-cath and has started chemotherapy.  The patient is 5 feet tall, weight is 215 pounds.  The preop bra size is a 40 2D cup.  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  She does not accept blood products.  She does want full resuscitation otherwise.  The left breast is firm and has extensive skin involvement.  She received 30+ rounds of radiation to the left breast in the past.   Review of Systems  Constitutional: Positive for activity change and appetite change. Negative for chills and fever.  HENT: Negative.   Eyes: Negative.   Respiratory: Negative.  Negative for shortness of breath.   Cardiovascular: Negative.   Gastrointestinal: Negative.   Endocrine: Negative.   Genitourinary: Negative.   Musculoskeletal: Positive for joint swelling.  Skin: Negative.   Neurological: Positive for dizziness and numbness.  Hematological: Negative.     Past Medical History:  Diagnosis Date  . Arthritis    "knees, lower back" (08/18/2017)  . Breast cancer ([place]) 2006   L ductal carcinoma in situ  with papillary features, high grade. S/p lumpectomy and radiation.  Marland Kitchen CAD (coronary artery disease) 11/07   cath 11/11 :  3V CADsee report   . Cataract    small both eyes  . Chronic lower back pain   . Chronic pain    MR 08/07/10 :  Spondylosis L4-5 with B foraminal narrowing and L4 nerve root enchroachment B.  . Chronic venous insufficiency 2012   Has had full W/U incl ECHO, LFT's, Cr, Umicro, TSH, BNP. Symptomatic treatment.  . Colonic polyp 1995   Colonoscopies 1994, 2000, and 02/02/2011. Last had 9 polyps - Tubular adenoma and tubulovillous was the pathology results without high grade dysplasia  . Diabetes mellitus type II    Insulin dependent. Has worked with [name] R previously.  . Diastolic CHF, chronic ([place])    NL EF by echo 01/2012, Gr I dd  . Essential hypertension    Requires 5 drug therapy and still difficult to control.  Marland Kitchen GERD (gastroesophageal reflux disease)   . Heart murmur   . Hyperlipidemia    On daily statin  . Iron deficiency anemia    Ferritin was 12 in 2012. on FeSo4. Has had colon and EGD 02/02/11 that showed mild gastritis and colon polyps.  . Obesity    Morbid. HAs worked with [name] T previously.  . OSA (obstructive sleep apnea) 02/16/2007   02/2007 : Moderate  AHI 35.6, O2 sat decreased to 65%, CPAP titration to 16 with AHI of 3.6. 02/2014 : Rare respiratory events with sleep disturbance, within normal limits. AHI 0.4 per hour      . OSA on CPAP   . Personal history of chemotherapy   . Personal history of radiation therapy    "to my left breast"  . Refusal of blood transfusions as patient is Jehovah's Witness   . Seasonal allergies    "grass pollen"    Past Surgical History:  Procedure Laterality Date  . BREAST LUMPECTOMY Left 2006  . BREAST LUMPECTOMY W/ NEEDLE LOCALIZATION  2006   34 Chemo RX  . CARDIAC CATHETERIZATION N/A 08/15/2015   Procedure: Left Heart Cath and Coronary Angiography;  Surgeon: Troy Sine, MD;  Location: [facility];   Service: Cardiovascular;  Laterality: N/A;  . COLONOSCOPY W/ BIOPSIES AND POLYPECTOMY  1994, 2000, 2012, 26/018  . IR IMAGING GUIDED PORT INSERTION  04/17/2018  . LEFT HEART CATH AND CORONARY ANGIOGRAPHY N/A 08/19/2017   Procedure: LEFT HEART CATH AND CORONARY ANGIOGRAPHY;  Surgeon: Belva Crome, MD;  Location: [facility];  Service: Cardiovascular;  Laterality: N/A;  . PORTACATH PLACEMENT N/A 04/14/2018   Procedure: ATTEMPED INSERTION PORT-A-CATH;  Surgeon: Jovita Kussmaul, MD;  Location: [facility];  Service: General;  Laterality: N/A;  . TONSILLECTOMY AND ADENOIDECTOMY  1964  . TOTAL ABDOMINAL HYSTERECTOMY  1985   "I had endometrosis"  . ULTRASOUND GUIDANCE FOR VASCULAR ACCESS  08/19/2017   Procedure: Ultrasound Guidance For Vascular Access;  Surgeon: Belva Crome, MD;  Location: [facility];  Service: Cardiovascular;;      Current Outpatient Medications:  .  ACCU-CHEK FASTCLIX LANCETS MISC, Check blood sugar 3 times a day, Disp: 102 each, Rfl: 5 .  ACCU-CHEK GUIDE test strip, USE 1  THREE TIMES DAILY, Disp: 375 each, Rfl: 1 .  amLODipine (NORVASC) 10 MG tablet, TAKE ONE (1) TABLET BY MOUTH EVERY DAY, Disp: 90 tablet, Rfl: 3 .  aspirin EC 81 MG tablet, Take 81 mg by mouth daily., Disp: , Rfl:  .  cetirizine (ZYRTEC) 10 MG tablet, Take 10 mg as needed by mouth for allergies., Disp: , Rfl:  .  chlorpheniramine-HYDROcodone (TUSSIONEX PENNKINETIC ER) 10-8 MG/5ML SUER, Take 5 mLs by mouth 2 (two) times daily., Disp: 140 mL, Rfl: 0 .  Coenzyme Q10 (CO Q 10 PO), Take 50 mg by mouth daily. , Disp: , Rfl:  .  dexamethasone (DECADRON) 4 MG tablet, Take 1 tablet (4 mg total) by mouth daily. Take 1 tablet day before chemo and 1 tablet day after chemo, Disp: 10 tablet, Rfl: 0 .  ezetimibe (ZETIA) 10 MG tablet, TAKE ONE (1) TABLET BY MOUTH EVERY DAY, Disp: 30 tablet, Rfl: 7 .  furosemide (LASIX) 20 MG tablet, Take 1 tablet (20 mg total) by mouth as needed for edema., Disp: 30 tablet, Rfl:  0 .  gabapentin (NEURONTIN) 300 MG capsule, Take 1 capsule (300 mg total) by mouth at bedtime., Disp: 30 capsule, Rfl: 2 .  insulin aspart (NOVOLOG FLEXPEN) 100 UNIT/ML FlexPen, If 150-199 inject 2 units,200-250 inject 3 units,251- 350 inject 4 units,351-400 inject 5 units, call office., Disp: 15 mL, Rfl: 1 .  Insulin Glargine (TOUJEO SOLOSTAR) 300 UNIT/ML SOPN, Inject 30 Units into the skin daily., Disp: 12 mL, Rfl: 3 .  Insulin Pen Needle 32G X 4 MM MISC, USE FOR INJECTIONS up to 4 times daily. Diagnosis code E11.40, Z79.4,  Disp: 200 each, Rfl: 5 .  isosorbide mononitrate (IMDUR) 120 MG 24 hr tablet, Take 2 tablets (240 mg total) by mouth every morning. (Patient taking differently: Take 240 mg by mouth daily. ), Disp: 30 tablet, Rfl: 11 .  isosorbide mononitrate (IMDUR) 120 MG 24 hr tablet, TAKE TWO (2) TABLETS BY MOUTH EVERY MORNING, Disp: 60 tablet, Rfl: 6 .  lidocaine-prilocaine (EMLA) cream, Apply to affected area once, Disp: 30 g, Rfl: 3 .  losartan (COZAAR) 50 MG tablet, TAKE ONE (1) TABLET BY MOUTH EVERY DAY, Disp: 90 tablet, Rfl: 3 .  Lutein 6 MG TABS, Take 6 mg by mouth daily., Disp: , Rfl:  .  meloxicam (MOBIC) 7.5 MG tablet, TAKE 1 TABLET BY MOUTH ONCE DAILY, Disp: 30 tablet, Rfl: 2 .  metFORMIN (GLUCOPHAGE) 1000 MG tablet, TAKE ONE (1) TABLET BY MOUTH TWO (2) TIMES DAILY WITH A MEAL, Disp: 180 tablet, Rfl: 3 .  metoprolol succinate (TOPROL-XL) 100 MG 24 hr tablet, TAKE ONE (1) TABLET BY MOUTH EVERY DAY, Disp: 90 tablet, Rfl: 3 .  nitroGLYCERIN (NITRODUR - DOSED IN MG/24 HR) 0.4 mg/hr patch, APPLY ONE PATCH ON SKIN EVERY DAY. LEAVE ON FOR 12 HOURS AND OFF FOR 12 HOURS., Disp: 100 patch, Rfl: 3 .  nitroGLYCERIN (NITROSTAT) 0.4 MG SL tablet, PLACE 1 TABLET UNDER THE TONGUE EVERY 5 MINUTES AS NEEDED FOR CHEST PAIN, Disp: 25 tablet, Rfl: 7 .  Olopatadine HCl (PATADAY) 0.2 % SOLN, Place 1 drop into both eyes daily as needed. For allergies, Disp: , Rfl:  .  omeprazole (PRILOSEC) 40 MG capsule,  Take 1 capsule (40 mg total) by mouth daily., Disp: 30 capsule, Rfl: 5 .  ondansetron (ZOFRAN) 8 MG tablet, Take 1 tablet (8 mg total) by mouth 2 (two) times daily as needed for refractory nausea / vomiting. Start on day 3 after chemo., Disp: 30 tablet, Rfl: 1 .  OVER THE COUNTER MEDICATION, Take 1 tablet by mouth daily. SUPER VITAMIN B-COMPLEX (Vitamin B6 and B12), Disp: , Rfl:  .  prochlorperazine (COMPAZINE) 10 MG tablet, Take 1 tablet (10 mg total) by mouth every 6 (six) hours as needed (Nausea or vomiting)., Disp: 60 tablet, Rfl: 2 .  spironolactone (ALDACTONE) 25 MG tablet, Take 1 tablet (25 mg total) by mouth daily., Disp: 90 tablet, Rfl: 3 .  traMADol (ULTRAM) 50 MG tablet, Take 1 tablet (50 mg total) by mouth every 12 (twelve) hours as needed., Disp: 30 tablet, Rfl: 0 .  Turmeric Curcumin 500 MG CAPS, Take 500 mg by mouth daily., Disp: , Rfl:    Objective:   Vitals:   07/06/18 1229  BP: 132/65  Pulse: 82  Resp: 16  SpO2: 98%    Physical Exam  Constitutional: She appears well-developed and well-nourished.  HENT:  Head: Normocephalic and atraumatic.  Eyes: Pupils are equal, round, and reactive to light. EOM are normal.  Cardiovascular: Normal rate.  Pulmonary/Chest: Effort normal.  Abdominal: Soft.  Skin: There is erythema.  Psychiatric: She has a normal mood and affect. Her behavior is normal. Judgment and thought content normal.    Assessment & Plan:  Morbid obesity ([place])  Malignant neoplasm of central portion of left breast in female, estrogen receptor positive ([place])  Type 2 diabetes mellitus with diabetic neuropathy, with long-term current use of insulin ([facility])  Chemotherapy-induced neuropathy  Malignant neoplasm of lower-inner quadrant of left breast in female, estrogen receptor positive ([place])  Assessment and Plan:  A long, detailed conversation was had regarding the patient's  options for breast reconstruction. Five main points, which are explained to all breast  reconstruction patients, were discussed.  1. Breast reconstruction is an optional process.  2. Breast reconstruction is a multi-stage process which involves multiple surgeries spaced several months apart. The entire process can take over one year.  3. The major goal of breast reconstruction is to have the patient look normal in clothing. When naked, there will always be scars.  4. Asymmetries are often present during the reconstruction process. Several operations may be needed, including surgery to the non-cancerous breast, to achieve satisfactory results.  5. No matter the reconstructive method, there are ways that the reconstruction can fail and a secondary reconstructive plan would need to be created.   A general discussion regarding all available methods of breast reconstruction were discussed. The types of reconstructions described included.  1. Tissue expander and implant based reconstruction, both single and multi-stage approaches.  2. Autologous only reconstructions, including free abdominal-tissue based reconstructions.  3. Combination procedures, particularly latissismus dorsi flaps combined with either expanders or implants.  For each of the reconstruction methods mentioned above, the risks, benefits, alternatives, scarring, and recovery time were discussed in great detail. Specific risks detailed included bleeding, infection, hematoma, seroma, scarring, pain, wound healing complications, flap loss, fat necrosis, capsular contracture, need for implant removal, donor site complications, bulge, hernia, umbilical necrosis, need for urgent reoperation, and need for dressing changes were discussed.   Assessment  Once all reconstruction options were presented, a focused discussion was had regarding the patient's suitability for each of these procedures.  A total of 50 minutes of face-to-face time was spent in this encounter, of which >50% was spent in counseling.  Due to the aggressive nature  of the cancer and extensive involvement of the left breast the patient is very reasonable in her expectations.  She is interested in cancer treatment with out reconstruction.  She would like bilateral mastectomies with closure as best as possible on the left side.  She understands that she will most likely need to have a staged procedure.  She will likely have a VAC placed at the time of the mastectomy.  We will then wait for negative margins and then plan on closure if possible.  She will likely need a latissimus muscle flap for that closure.  She understands the extent of the disease and wants to do everything she can for cure and survival.  I will plan on assisting Dr. Marlou Starks for this procedure.  [place], DO

## 2018-07-07 ENCOUNTER — Other Ambulatory Visit: Payer: Self-pay | Admitting: Internal Medicine

## 2018-07-07 ENCOUNTER — Ambulatory Visit: Payer: Medicare Other | Admitting: Internal Medicine

## 2018-07-07 ENCOUNTER — Ambulatory Visit: Payer: Medicare Other | Admitting: Dietician

## 2018-07-07 ENCOUNTER — Encounter: Payer: Self-pay | Admitting: Dietician

## 2018-07-07 ENCOUNTER — Other Ambulatory Visit: Payer: Self-pay

## 2018-07-07 ENCOUNTER — Encounter: Payer: Self-pay | Admitting: Internal Medicine

## 2018-07-07 VITALS — BP 111/66 | HR 80 | Temp 98.2°F | Ht 61.0 in | Wt 225.7 lb

## 2018-07-07 DIAGNOSIS — E1140 Type 2 diabetes mellitus with diabetic neuropathy, unspecified: Secondary | ICD-10-CM

## 2018-07-07 DIAGNOSIS — Z794 Long term (current) use of insulin: Principal | ICD-10-CM

## 2018-07-07 DIAGNOSIS — R600 Localized edema: Secondary | ICD-10-CM

## 2018-07-07 NOTE — Patient Instructions (Signed)
[name],  It was a pleasure to meet you. Please continue to take all of your medicine as previously prescribed. No changes today. Make an appointment to follow up with your primary care doctor in the next 1-3 months. If you have any questions or concerns, call our clinic at [phone] or after hours call [phone] and ask for the internal medicine resident on call. Thank you!  Dr. Philipp Ovens

## 2018-07-07 NOTE — Progress Notes (Signed)
   CC: Diabetes follow up  HPI:  [name] is a 67 y.o. female with past medical history outlined below here for diabetes follow up. For the details of today's visit, please refer to the assessment and plan.  Past Medical History:  Diagnosis Date  . Arthritis    "knees, lower back" (08/18/2017)  . 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 S/p lumpectomy and radiation.  Marland Kitchen CAD (coronary artery disease) 11/07   cath 11/11 :  3V CADsee report   . Cataract    small both eyes  . Chronic lower back pain   . Chronic pain    MR 08/07/10 :  Spondylosis L4-5 with B foraminal narrowing and L4 nerve root enchroachment B.  . Chronic venous insufficiency 2012   Has had full W/U incl ECHO, LFT's, Cr, Umicro, TSH, BNP. Symptomatic treatment.  . Colonic polyp 1995   Colonoscopies 1994, 2000, and 02/02/2011. Last had 9 polyps - Tubular adenoma and tubulovillous was the pathology results without high grade dysplasia  . Diabetes mellitus type II    Insulin dependent. Has worked with [name] R previously.  . Diastolic CHF, chronic ([place])    NL EF by echo 01/2012, Gr I dd  . Essential hypertension    Requires 5 drug therapy and still difficult to control.  Marland Kitchen GERD (gastroesophageal reflux disease)   . Heart murmur   . Hyperlipidemia    On daily statin  . Iron deficiency anemia    Ferritin was 12 in 2012. on FeSo4. Has had colon and EGD 02/02/11 that showed mild gastritis and colon polyps.  . Obesity    Morbid. HAs worked with [name] T previously.  . OSA (obstructive sleep apnea) 02/16/2007   02/2007 : Moderate AHI 35.6, O2 sat decreased to 65%, CPAP titration to 16 with AHI of 3.6. 02/2014 : Rare respiratory events with sleep disturbance, within normal limits. AHI 0.4 per hour      . OSA on CPAP   . Personal history of chemotherapy   . Personal history of radiation therapy    "to my left breast"  . Refusal of blood transfusions as patient is Jehovah's  Witness   . Seasonal allergies    "grass pollen"    Review of Systems  Respiratory: Negative for shortness of breath.   Cardiovascular: Negative for chest pain.    Physical Exam:  Vitals:   07/07/18 1542  BP: 111/66  Pulse: 80  Temp: 98.2 F (36.8 C)  TempSrc: Oral  SpO2: 92%  Weight: 225 lb 11.2 oz (102.4 kg)  Height: 5\' 1"  (1.549 m)    Constitutional: NAD, appears comfortable Cardiovascular: RRR, no murmurs, rubs, or gallops.  Pulmonary/Chest: CTAB, no wheezes, rales, or rhonchi.  Extremities: Warm and well perfused.  No edema.  Skin: No rashes or erythema  Psychiatric: Normal mood and affect  Assessment & Plan:   See Encounters Tab for problem based charting.  Patient discussed with Dr. Dareen Piano

## 2018-07-07 NOTE — Progress Notes (Signed)
Documentation:  Ms. Regner presents for CGM download #2. Her blood sugars are about the same and well controlled. Her sensor stopped working on day 11. She needed additional correction insulin only one time in the past week.  She reports nausea and some vomiting, but her food records still showed a reasonable intake on most days. She asked for information about what she can eat to build her up for surgery.  She was educated on high protein foods and provided with sample high protein supplements and instructed to drink one each day for the next 2 weeks. Suggest A1C next visit. CGM in 3 months(December 2019- January 2020). Follow up in 1-2 months.  [name], RD 07/07/2018 5:34 PM.

## 2018-07-07 NOTE — Assessment & Plan Note (Addendum)
Patient is here for CGM follow up. This is her second download. Unfortunately her meter dislodged since her last appointment and we only have data for 4 days. At her last appointment, her long acting was decreased from 35 -> 30 units daily, repaglinide was stopped, and she was continued on a sliding scale with mealtime. Average blood glucose over the next 4 days was 126. She was within target range approximately 90% of the time, over target about 10% of the time, and below 0% of the time.  -- No medication change today -- Continue Toujeo 30 units daily -- Continue SSI TID AC  -- Metformin 1000 mg BID -- Discussed with patient the need to hold her long acting the morning of surgery, as she will be NPO the evening prior (takes her long acting qAM) -- Follow up with PCP

## 2018-07-10 NOTE — Progress Notes (Signed)
Internal Medicine Clinic Attending  Case discussed with Dr. Philipp Ovens at the time of the visit. We reviewed the resident's history and exam and pertinent patient test results. I personally reviewed the CGM data & the resident's interpretation. I agree with the assessment, diagnosis, and plan of care documented in the resident's note.

## 2018-07-13 ENCOUNTER — Encounter (HOSPITAL_COMMUNITY): Payer: Self-pay

## 2018-07-13 ENCOUNTER — Encounter (HOSPITAL_COMMUNITY)
Admission: RE | Admit: 2018-07-13 | Discharge: 2018-07-13 | Disposition: A | Discharge status: Home / Self Care | Payer: Medicare Other | Source: Ambulatory Visit | Attending: General Surgery | Admitting: General Surgery

## 2018-07-13 ENCOUNTER — Other Ambulatory Visit: Payer: Self-pay

## 2018-07-13 DIAGNOSIS — Z79899 Other long term (current) drug therapy: Secondary | ICD-10-CM | POA: Diagnosis not present

## 2018-07-13 DIAGNOSIS — Z791 Long term (current) use of non-steroidal anti-inflammatories (NSAID): Secondary | ICD-10-CM | POA: Insufficient documentation

## 2018-07-13 DIAGNOSIS — Z7982 Long term (current) use of aspirin: Secondary | ICD-10-CM | POA: Diagnosis not present

## 2018-07-13 DIAGNOSIS — C50312 Malignant neoplasm of lower-inner quadrant of left female breast: Secondary | ICD-10-CM | POA: Insufficient documentation

## 2018-07-13 DIAGNOSIS — Z794 Long term (current) use of insulin: Secondary | ICD-10-CM | POA: Insufficient documentation

## 2018-07-13 DIAGNOSIS — Z170 Estrogen receptor positive status [ER+]: Secondary | ICD-10-CM | POA: Diagnosis not present

## 2018-07-13 DIAGNOSIS — Z01818 Encounter for other preprocedural examination: Secondary | ICD-10-CM | POA: Diagnosis not present

## 2018-07-13 DIAGNOSIS — E119 Type 2 diabetes mellitus without complications: Secondary | ICD-10-CM | POA: Diagnosis not present

## 2018-07-13 LAB — BASIC METABOLIC PANEL
ANION GAP: 11 (ref 5–15)
BUN: 17 mg/dL (ref 8–23)
CHLORIDE: 107 mmol/L (ref 98–111)
CO2: 22 mmol/L (ref 22–32)
Calcium: 9.4 mg/dL (ref 8.9–10.3)
Creatinine, Ser: 1.22 mg/dL — ABNORMAL HIGH (ref 0.44–1.00)
GFR calc non Af Amer: 45 mL/min — ABNORMAL LOW (ref >60)
GFR, EST AFRICAN AMERICAN: 52 mL/min — AB (ref >60)
Glucose, Bld: 198 mg/dL — ABNORMAL HIGH (ref 70–99)
POTASSIUM: 3.7 mmol/L (ref 3.5–5.1)
Sodium: 140 mmol/L (ref 135–145)

## 2018-07-13 LAB — CBC
HEMATOCRIT: 34.7 % — AB (ref 36.0–46.0)
Hemoglobin: 10.7 g/dL — ABNORMAL LOW (ref 12.0–15.0)
MCH: 29.9 pg (ref 26.0–34.0)
MCHC: 30.8 g/dL (ref 30.0–36.0)
MCV: 96.9 fL (ref 80.0–100.0)
NRBC: 0 % (ref 0.0–0.2)
PLATELETS: 262 10*3/uL (ref 150–400)
RBC: 3.58 MIL/uL — AB (ref 3.87–5.11)
RDW: 19.5 % — ABNORMAL HIGH (ref 11.5–15.5)
WBC: 5.7 10*3/uL (ref 4.0–10.5)

## 2018-07-13 LAB — HEMOGLOBIN A1C
Hgb A1c MFr Bld: 8.9 % — ABNORMAL HIGH (ref 4.8–5.6)
Mean Plasma Glucose: 208.73 mg/dL

## 2018-07-13 LAB — NO BLOOD PRODUCTS

## 2018-07-13 LAB — GLUCOSE, CAPILLARY: GLUCOSE-CAPILLARY: 174 mg/dL — AB (ref 70–99)

## 2018-07-13 NOTE — Pre-Procedure Instructions (Signed)
MELEAH DEMEYER  07/13/2018      [facility] at [place], [place] - [address] [address] Phone: [phone] Fax: [phone]  [facility] [address] [place]), [place] - [address] [place] ([place]) [place] [address] Phone: [phone] Fax: [phone]    Your procedure is scheduled on July 20, 2018.  Report to [facility] Admitting at 0600 AM.  Call this number if you have problems the morning of surgery:  [phone]   Remember:  Do not eat or drink after midnight.  You may drink clear liquids until 500 AM the morning of surgery-3 hours prior to procedure start time.  Clear liquids allowed are:    Water, Juice (non-citric and without pulp), Carbonated beverages, Clear Tea, Black Coffee only, Plain Jell-O only, Gatorade and Plain Popsicles only- NO MILK PRODUCTS    Take these medicines the morning of surgery with A SIP OF WATER  Metoprolol succinate (Toprol XL) Amlodipine (norvasc) Gabapentin (neurontin) Loratadine (claritin) Eye drops-if needed Omeprazole (Prilosec) Compazine-if needed for nausea Tramadol (ultram)-if needed for pain  Follow your surgeon's instructions on when to hold/resume aspirin.  If no instructions were given call the office to determine how they would like to you take aspirin  7 days prior to surgery STOP taking any meloxicam (mobic), Aleve, Naproxen, Ibuprofen, Motrin, Advil, Goody's, BC's, all herbal medications, fish oil, and all vitamins  WHAT DO I DO ABOUT MY DIABETES MEDICATION?  Marland Kitchen Do not take oral diabetes medicines (pills) the morning of surgery-metformin (glucophage)     . THE MORNING OF SURGERY, take 15 units of toujeo insulin-1/2 of your normal dose.  . The day of surgery, do not take other diabetes injectables, including Byetta (exenatide), Bydureon (exenatide ER), Victoza (liraglutide), or Trulicity  (dulaglutide).  Reviewed and Endorsed by [facility] Patient Education Committee, August 2015  How to Manage Your Diabetes Before and After Surgery  Why is it important to control my blood sugar before and after surgery? . Improving blood sugar levels before and after surgery helps healing and can limit problems. . A way of improving blood sugar control is eating a healthy diet by: o  Eating less sugar and carbohydrates o  Increasing activity/exercise o  Talking with your doctor about reaching your blood sugar goals . High blood sugars (greater than 180 mg/dL) can raise your risk of infections and slow your recovery, so you will need to focus on controlling your diabetes during the weeks before surgery. . Make sure that the doctor who takes care of your diabetes knows about your planned surgery including the date and location.  How do I manage my blood sugar before surgery? . Check your blood sugar at least 4 times a day, starting 2 days before surgery, to make sure that the level is not too high or low. o Check your blood sugar the morning of your surgery when you wake up and every 2 hours until you get to the Short Stay unit. . If your blood sugar is less than 70 mg/dL, you will need to treat for low blood sugar: o Do not take insulin. o Treat a low blood sugar (less than 70 mg/dL) with  cup of clear juice (cranberry or apple), 4 glucose tablets, OR glucose gel. Recheck blood sugar in 15 minutes after treatment (to make sure it is greater than 70 mg/dL). If your blood  sugar is not greater than 70 mg/dL on recheck, call [phone] o  for further instructions. . Report your blood sugar to the short stay nurse when you get to Short Stay.  . If you are admitted to the hospital after surgery: o Your blood sugar will be checked by the staff and you will probably be given insulin after surgery (instead of oral diabetes medicines) to make sure you have good blood sugar levels. o The goal for  blood sugar control after surgery is 80-180 mg/dL.   [facility]- Preparing For Surgery  Before surgery, you can play an important role. Because skin is not sterile, your skin needs to be as free of germs as possible. You can reduce the number of germs on your skin by washing with CHG (chlorahexidine gluconate) Soap before surgery.  CHG is an antiseptic cleaner which kills germs and bonds with the skin to continue killing germs even after washing.    Oral Hygiene is also important to reduce your risk of infection.  Remember - BRUSH YOUR TEETH THE MORNING OF SURGERY WITH YOUR REGULAR TOOTHPASTE  Please do not use if you have an allergy to CHG or antibacterial soaps. If your skin becomes reddened/irritated stop using the CHG.  Do not shave (including legs and underarms) for at least 48 hours prior to first CHG shower. It is OK to shave your face.  Please follow these instructions carefully.   1. Shower the NIGHT BEFORE SURGERY and the MORNING OF SURGERY with CHG.   2. If you chose to wash your hair, wash your hair first as usual with your normal shampoo.  3. After you shampoo, rinse your hair and body thoroughly to remove the shampoo.  4. Use CHG as you would any other liquid soap. You can apply CHG directly to the skin and wash gently with a scrungie or a clean washcloth.   5. Apply the CHG Soap to your body ONLY FROM THE NECK DOWN.  Do not use on open wounds or open sores. Avoid contact with your eyes, ears, mouth and genitals (private parts). Wash Face and genitals (private parts)  with your normal soap.  6. Wash thoroughly, paying special attention to the area where your surgery will be performed.  7. Thoroughly rinse your body with warm water from the neck down.  8. DO NOT shower/wash with your normal soap after using and rinsing off the CHG Soap.  9. Pat yourself dry with a CLEAN TOWEL.  10. Wear CLEAN PAJAMAS to bed the night before surgery, wear comfortable clothes the morning  of surgery  11. Place CLEAN SHEETS on your bed the night of your first shower and DO NOT SLEEP WITH PETS.  Day of Surgery:  Do not apply any deodorants/lotions.  Please wear clean clothes to the hospital/surgery center.   Remember to brush your teeth WITH YOUR REGULAR TOOTHPASTE.   Do not wear jewelry, make-up or nail polish.  Do not wear lotions, powders, or perfumes, or deodorant.  Do not shave 48 hours prior to surgery.   Do not bring valuables to the hospital.  [facility] is not responsible for any belongings or valuables.  Contacts, dentures or bridgework may not be worn into surgery.  Leave your suitcase in the car.  After surgery it may be brought to your room.  For patients admitted to the hospital, discharge time will be determined by your treatment team.  Patients discharged the day of surgery will not be allowed to drive  home.   Please read over the fact sheets that you were given.

## 2018-07-13 NOTE — Progress Notes (Addendum)
PCP: Larey Dresser, MD  Cardiologist: Minus Breeding, MD  EKG: 08/2017 in EPIC   Stress test: 2013 in EPIC  ECHO: 06/2018 in EPIC  Cardiac Cath: 08/2017 in EPIC  Chest x-ray: 05/17/18 in Epic  Patient takes ASA 81 mg-no instructions given on when/if to hold.  Patient is calling Dr. Ethlyn Gallery office today to get instructions.

## 2018-07-14 NOTE — Progress Notes (Addendum)
Anesthesia Chart Review:  Case:  [id number] Date/Time:  07/20/18 0745   Procedure:  BILATERAL MODIFIED RADICAL MASTECTOMIES (Bilateral Breast)   Anesthesia type:  General   Pre-op diagnosis:  LEFT BREAST INFLAMMATORY CANCER   Location:  [facility] OR ROOM 09 / [facility] OR   Surgeon:  Jovita Kussmaul, MD      DISCUSSION: Patient is a 67 year old female scheduled for the above procedure. She underwent attempted Port-a-cath insertion on 04/14/18, but placement of right jugular port by IR 04/17/18.   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 Jehovah's Witness. BMI is consistent with morbid obesity.  According to Dr. Ethlyn Gallery 03/30/18 off note, he will obtain cardiac clearance from Dr. Percival Spanish. Patient was not a CABG candidate 08/2017. Continued medical therapy recommended at her 11/03/17 visit with Dr. Percival Spanish. According to 04/07/18 encounter by Kerin Ransom, PA-C, "...based on ACC/AHA guidelines, [name] would be at acceptable risk for the planned procedure without further cardiovascular testing."   Anesthesia APP not consulted during PAT, but due to her cardiac history, I did call and speak with patient. She denied chest pain. Reported chest pain present at the time of her cardiac work-up last year (08/2017), but now resolved with medical management and change in her diet (avoid fatty, spicy foods). She denied SOB unless she develops "fluid build up" for which she has been able to manage well with Lasix PRN and limiting salt intake. Denied orthopnea. Reports  she is still able to dust and mop without CV symptoms, just has low back pain. She had a recent echo per PCP (post chemo) that showed normal LVEF and wall motion. In regards to her elevated A1c 8.9, she reported issues with hyperglycemia while on chemotherapy which she completed ~ 3 weeks ago. Currently home CBGs are starting to trend down, but still above her "usual" before chemo. Within the last few days fasting CBGs have been ~ 170-210. A1c and H/H routed to Dr. Marlou Starks. Anemia stable. He reports that plastic surgeon Audelia Hives, DO is going to assist with wound closure and that patient is aware. I spoke with [facility] triage nurse Sunday Spillers to inquire if patient was to hold or continue ASA. She said. a [facility] staff member will notify patient that it is okay for her to continue ASA perioperatively given CAD history.   Patient with inflammatory breast cancer and has recently completed chemotherapy and now needs bilateral mastectomies. She has known CAD and not felt to be a CABG candidate 08/2017. No new testing or coronary intervention recommended at her last cardiology follow-up visit earlier this year. Cardiology felt patient was "acceptable risk" for planned surgery. She reports she is able to mop and dust without CV symptoms. H/H stable. Based on currently available information, I would anticipate that she can proceed as planned. Anesthesiologist to re-evaluate on the day of surgery.     VS: BP (!) 124/55   Pulse 90   Temp 36.6 C (Oral)   Resp 16   Ht 5\' 1"  (1.549 m)   Wt 103.9 kg   LMP 07/12/1984   SpO2 99%   BMI 43.29 kg/m  PROVIDERS: Bartholomew Crews, MD is PCP. Seen by Velna Ochs, MD on 07/07/18 at [facility] for DM follow-up prior to surgery. - Minus Breeding, MD is cardiologist. Last visit 11/03/17. No new CV symptoms. No further CV testing felt indicated at that time.  Nicholas Lose, MD is HEM-ONC. Last visit 06/20/18.   LABS: Preoperative labs noted. H/H stable  at 10.7/34.7. Cr 1.22, up from ~ 0.9-1.0. A1c 8.9% (up from 8.3 on 03/30/18), consistent with average glucose of 208.73. NO BLOOD TRANSFUSION (signed only albumin or albumin containing products may be administered). (all labs ordered are listed, but only abnormal results are displayed)  Labs Reviewed  GLUCOSE, CAPILLARY - Abnormal; Notable for the following components:      Result Value   Glucose-Capillary 174 (*)    All other components within normal limits  BASIC METABOLIC PANEL - Abnormal; Notable for the following components:   Glucose, Bld 198 (*)    Creatinine, Ser 1.22 (*)    GFR calc non Af Amer 45 (*)    GFR calc Af Amer 52 (*)    All other components within normal limits  CBC - Abnormal; Notable for the following components:   RBC 3.58 (*)    Hemoglobin 10.7 (*)    HCT 34.7 (*)    RDW 19.5 (*)    All other components within normal limits  HEMOGLOBIN A1C - Abnormal; Notable for the following components:   Hgb A1c MFr Bld 8.9 (*)    All other components within normal limits  NO BLOOD PRODUCTS    IMAGES: CXR 05/17/18: FINDINGS: Right IJ Port-A-Cath is in place with the tip in the mid to lower superior vena cava. Lungs are clear. No pneumothorax or pleural effusion. Heart size is normal. No acute bony abnormality. IMPRESSION: No acute disease.  CT Chest 04/20/18: IMPRESSION: 1. Multiple masses within the left breast compatible with recently diagnosed left breast carcinoma. 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 2. Cortically thickened right axillary lymph node, indeterminate. Metastatic disease not excluded. 3. No evidence for additional sites of disease within the chest, abdomen or pelvis.   EKG: 08/18/17: SR, low voltage, extremity in precordial leads.  Consider anterior infarct.  Baseline wander in leads I, II, aVR.   CV: Echo 06/28/18: Study Conclusions - Left ventricle: The cavity size was normal. There was mild    concentric hypertrophy. Systolic function was normal. The   estimated ejection fraction was in the range of 60% to 65%. Wall   motion was normal; there were no regional wall motion   abnormalities. There was an increased relative contribution of   atrial contraction to ventricular filling. Doppler parameters are   consistent with abnormal left ventricular relaxation (grade 1   diastolic dysfunction). - Mitral valve: Calcified annulus. - Pulmonary arteries: Systolic pressure could not be accurately   estimated.  Cardiac cath 08/19/17: Left Anterior Descending  Prox LAD lesion 80% stenosed  Prox LAD lesion is 80% stenosed. Pressure wire/FFR was performed on the lesion. FFR: 0.68.  Ramus Intermedius  Vessel is small.  Ost Ramus to Ramus lesion 75% stenosed  Ost Ramus to Ramus lesion is 75% stenosed.  Left Circumflex  Prox Cx lesion 80% stenosed  Prox Cx lesion is 80% stenosed.  Mid Cx lesion 90% stenosed  Mid Cx lesion is 90% stenosed.  Dist Cx lesion 95% stenosed  Dist Cx lesion is 95% stenosed.  First Obtuse Marginal Branch  Ost 1st Mrg lesion  90% stenosed  Ost 1st Mrg lesion is 90% stenosed.  Second Obtuse Marginal Branch  Vessel is small in size.  Third Obtuse Marginal Branch  Vessel is small in size.  Right Coronary Artery  Vessel is small.  Prox RCA lesion 85% stenosed  Prox RCA lesion is 85% stenosed.  Prox RCA to Mid RCA lesion 70% stenosed  Prox RCA to Mid RCA lesion is 70% stenosed.  Right Posterior Atrioventricular Branch  Vessel is small in size.  First Right Posterolateral  Vessel is small in size.  Conclusion:  Severe multivessel coronary disease including a segmental proximal to mid 70-80 80% LAD that was proven hemodynamically significant by FFR of 0.64.  90-95% obstruction in the first obtuse marginal, proximal circumflex 80% stenosis, mid circumflex 90% stenosis, circumflex branches beyond the first obtuse marginal quite small and not graftable.  Small  70-80% ramus intermedius  40% mid body left main stenosis.  Catheter damping with engagement using the 6 [place] guide catheter.  Nondominant right coronary with proximal 80-90% obstruction.  Normal left ventricular function with EF estimated at 65%.  Normal left ventricular filling pressures. RECOMMENDATIONS:  Uptitrate medical therapy with additional long-acting nitrate, and beta-blocker.  Recommend the patient consider coronary artery bypass grafting given diabetes and multifocal nature of disease.  She has some reservations and wants to know more about alternative treatment strategies but wants to learn about bypass surgery in her particular situation.  Percutaneous options exist in the first obtuse marginal which is the likely culprit in the mid LAD.  Briefly discussed percutaneous option and incomplete nature of revascularization.  48 Hour Holter monitor 07/10/15: 1. NSR 2. Sinus bradycardia 3. Sinus tachycardia 4. Rare PAC's.   Past Medical History:  Diagnosis Date  . Arthritis    "knees, lower back" (08/18/2017)  . 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 S/p lumpectomy and radiation.  Marland Kitchen CAD (coronary artery disease) 11/07   cath 11/11 :  3V CADsee report   . Cataract    small both eyes  . Chronic lower back pain   . Chronic pain    MR 08/07/10 :  Spondylosis L4-5 with B foraminal narrowing and L4 nerve root enchroachment B.  . Chronic venous insufficiency 2012   Has had full W/U incl ECHO, LFT's, Cr, Umicro, TSH, BNP. Symptomatic treatment.  . Colonic polyp 1995   Colonoscopies 1994, 2000, and 02/02/2011. Last had 9 polyps - Tubular adenoma and tubulovillous was the pathology results without high grade dysplasia  . Diabetes mellitus type II    Insulin dependent. Has worked with [name] R previously.  . Diastolic CHF, chronic ([facility])    NL EF by echo 01/2012, Gr I dd  . Essential hypertension    Requires 5 drug therapy and  still difficult to control.  Marland Kitchen GERD (gastroesophageal reflux disease)   . Heart murmur   . Hyperlipidemia    On daily statin  . Iron deficiency anemia    Ferritin was 12 in 2012. on FeSo4. Has had colon and EGD 02/02/11 that showed mild gastritis and colon polyps.  . Obesity    Morbid. HAs worked with [name] T previously.  . OSA (obstructive sleep apnea) 02/16/2007   02/2007 : Moderate AHI 35.6, O2 sat decreased to 65%, CPAP titration to 16 with AHI of 3.6. 02/2014 : Rare respiratory events with sleep disturbance, within normal limits. AHI 0.4 per hour      . OSA on  CPAP   . Personal history of chemotherapy   . Personal history of radiation therapy    "to my left breast"  . Refusal of blood transfusions as patient is Jehovah's Witness   . Seasonal allergies    "grass pollen"    Past Surgical History:  Procedure Laterality Date  . BREAST LUMPECTOMY Left 2006  . BREAST LUMPECTOMY W/ NEEDLE LOCALIZATION  2006   34 Chemo RX  . CARDIAC CATHETERIZATION N/A 08/15/2015   Procedure: Left Heart Cath and Coronary Angiography;  Surgeon: Troy Sine, MD;  Location: [facility];  Service: Cardiovascular;  Laterality: N/A;  . COLONOSCOPY W/ BIOPSIES AND POLYPECTOMY  1994, 2000, 2012, 26/018  . IR IMAGING GUIDED PORT INSERTION  04/17/2018  . LEFT HEART CATH AND CORONARY ANGIOGRAPHY N/A 08/19/2017   Procedure: LEFT HEART CATH AND CORONARY ANGIOGRAPHY;  Surgeon: Belva Crome, MD;  Location: [facility] LAB;  Service: Cardiovascular;  Laterality: N/A;  . PORTACATH PLACEMENT N/A 04/14/2018   Procedure: ATTEMPED INSERTION PORT-A-CATH;  Surgeon: Jovita Kussmaul, MD;  Location: [facility];  Service: General;  Laterality: N/A;  . TONSILLECTOMY AND ADENOIDECTOMY  1964  . TOTAL ABDOMINAL HYSTERECTOMY  1985   "I had endometrosis"  . ULTRASOUND GUIDANCE FOR VASCULAR ACCESS  08/19/2017   Procedure: Ultrasound Guidance For Vascular Access;  Surgeon: Belva Crome, MD;  Location: [facility];   Service: Cardiovascular;;    MEDICATIONS: . ACCU-CHEK FASTCLIX LANCETS MISC  . ACCU-CHEK GUIDE test strip  . amLODipine (NORVASC) 10 MG tablet  . aspirin EC 81 MG tablet  . chlorpheniramine-HYDROcodone (TUSSIONEX PENNKINETIC ER) 10-8 MG/5ML SUER  . Coenzyme Q10 (CO Q 10) 100 MG CAPS  . dexamethasone (DECADRON) 4 MG tablet  . ezetimibe (ZETIA) 10 MG tablet  . furosemide (LASIX) 20 MG tablet  . gabapentin (NEURONTIN) 300 MG capsule  . insulin aspart (NOVOLOG FLEXPEN) 100 UNIT/ML FlexPen  . insulin aspart protamine- aspart (NOVOLOG MIX 70/30) (70-30) 100 UNIT/ML injection  . Insulin Glargine (TOUJEO SOLOSTAR) 300 UNIT/ML SOPN  . Insulin Pen Needle 32G X 4 MM MISC  . isosorbide mononitrate (IMDUR) 120 MG 24 hr tablet  . lidocaine-prilocaine (EMLA) cream  . loratadine (CLARITIN) 10 MG tablet  . losartan (COZAAR) 50 MG tablet  . meloxicam (MOBIC) 7.5 MG tablet  . metFORMIN (GLUCOPHAGE) 1000 MG tablet  . metoprolol succinate (TOPROL-XL) 100 MG 24 hr tablet  . nitroGLYCERIN (NITRODUR - DOSED IN MG/24 HR) 0.4 mg/hr patch  . nitroGLYCERIN (NITROSTAT) 0.4 MG SL tablet  . Olopatadine HCl (PATADAY) 0.2 % SOLN  . omeprazole (PRILOSEC) 40 MG capsule  . ondansetron (ZOFRAN) 8 MG tablet  . prochlorperazine (COMPAZINE) 10 MG tablet  . spironolactone (ALDACTONE) 25 MG tablet  . traMADol (ULTRAM) 50 MG tablet  . vitamin B-12 (CYANOCOBALAMIN) 1000 MCG tablet   No current facility-administered medications for this encounter.     George Hugh [facility] Short Stay Center/Anesthesiology Phone [phone] 07/17/2018 5:19 PM

## 2018-07-20 ENCOUNTER — Ambulatory Visit (HOSPITAL_COMMUNITY)
Admission: RE | Admit: 2018-07-20 | Discharge: 2018-07-22 | Disposition: A | Discharge status: Home / Self Care | Payer: Medicare Other | Source: Ambulatory Visit | Attending: General Surgery | Admitting: General Surgery

## 2018-07-20 ENCOUNTER — Ambulatory Visit (HOSPITAL_COMMUNITY): Payer: Medicare Other | Admitting: Critical Care Medicine

## 2018-07-20 ENCOUNTER — Encounter (HOSPITAL_COMMUNITY)
Admission: RE | Disposition: A | Discharge status: Home / Self Care | Payer: Self-pay | Source: Ambulatory Visit | Attending: General Surgery

## 2018-07-20 ENCOUNTER — Ambulatory Visit (HOSPITAL_COMMUNITY): Payer: Medicare Other | Admitting: Vascular Surgery

## 2018-07-20 ENCOUNTER — Encounter (HOSPITAL_COMMUNITY): Payer: Self-pay | Admitting: *Deleted

## 2018-07-20 DIAGNOSIS — I2510 Atherosclerotic heart disease of native coronary artery without angina pectoris: Secondary | ICD-10-CM | POA: Diagnosis not present

## 2018-07-20 DIAGNOSIS — C50312 Malignant neoplasm of lower-inner quadrant of left female breast: Secondary | ICD-10-CM | POA: Diagnosis not present

## 2018-07-20 DIAGNOSIS — K219 Gastro-esophageal reflux disease without esophagitis: Secondary | ICD-10-CM | POA: Insufficient documentation

## 2018-07-20 DIAGNOSIS — M1990 Unspecified osteoarthritis, unspecified site: Secondary | ICD-10-CM | POA: Insufficient documentation

## 2018-07-20 DIAGNOSIS — C50912 Malignant neoplasm of unspecified site of left female breast: Secondary | ICD-10-CM | POA: Diagnosis not present

## 2018-07-20 DIAGNOSIS — C50911 Malignant neoplasm of unspecified site of right female breast: Secondary | ICD-10-CM | POA: Insufficient documentation

## 2018-07-20 DIAGNOSIS — Z885 Allergy status to narcotic agent status: Secondary | ICD-10-CM | POA: Insufficient documentation

## 2018-07-20 DIAGNOSIS — Z7982 Long term (current) use of aspirin: Secondary | ICD-10-CM | POA: Insufficient documentation

## 2018-07-20 DIAGNOSIS — Z794 Long term (current) use of insulin: Secondary | ICD-10-CM | POA: Insufficient documentation

## 2018-07-20 DIAGNOSIS — G8929 Other chronic pain: Secondary | ICD-10-CM | POA: Diagnosis not present

## 2018-07-20 DIAGNOSIS — C773 Secondary and unspecified malignant neoplasm of axilla and upper limb lymph nodes: Secondary | ICD-10-CM | POA: Insufficient documentation

## 2018-07-20 DIAGNOSIS — Z7901 Long term (current) use of anticoagulants: Secondary | ICD-10-CM | POA: Diagnosis not present

## 2018-07-20 DIAGNOSIS — E119 Type 2 diabetes mellitus without complications: Secondary | ICD-10-CM | POA: Diagnosis not present

## 2018-07-20 DIAGNOSIS — G8918 Other acute postprocedural pain: Secondary | ICD-10-CM | POA: Diagnosis not present

## 2018-07-20 DIAGNOSIS — I110 Hypertensive heart disease with heart failure: Secondary | ICD-10-CM | POA: Insufficient documentation

## 2018-07-20 DIAGNOSIS — C50112 Malignant neoplasm of central portion of left female breast: Secondary | ICD-10-CM | POA: Insufficient documentation

## 2018-07-20 DIAGNOSIS — R401 Stupor: Secondary | ICD-10-CM | POA: Diagnosis not present

## 2018-07-20 DIAGNOSIS — Z171 Estrogen receptor negative status [ER-]: Secondary | ICD-10-CM | POA: Insufficient documentation

## 2018-07-20 DIAGNOSIS — E785 Hyperlipidemia, unspecified: Secondary | ICD-10-CM | POA: Insufficient documentation

## 2018-07-20 DIAGNOSIS — R410 Disorientation, unspecified: Secondary | ICD-10-CM

## 2018-07-20 DIAGNOSIS — I5032 Chronic diastolic (congestive) heart failure: Secondary | ICD-10-CM | POA: Insufficient documentation

## 2018-07-20 DIAGNOSIS — Z853 Personal history of malignant neoplasm of breast: Secondary | ICD-10-CM | POA: Diagnosis not present

## 2018-07-20 DIAGNOSIS — Z6841 Body Mass Index (BMI) 40.0 and over, adult: Secondary | ICD-10-CM | POA: Diagnosis not present

## 2018-07-20 DIAGNOSIS — G4733 Obstructive sleep apnea (adult) (pediatric): Secondary | ICD-10-CM | POA: Insufficient documentation

## 2018-07-20 DIAGNOSIS — C779 Secondary and unspecified malignant neoplasm of lymph node, unspecified: Secondary | ICD-10-CM | POA: Diagnosis not present

## 2018-07-20 DIAGNOSIS — I10 Essential (primary) hypertension: Secondary | ICD-10-CM | POA: Diagnosis not present

## 2018-07-20 HISTORY — PX: MASTECTOMY MODIFIED RADICAL: SUR848

## 2018-07-20 HISTORY — PX: MASTECTOMY MODIFIED RADICAL: SHX5962

## 2018-07-20 LAB — GLUCOSE, CAPILLARY
GLUCOSE-CAPILLARY: 239 mg/dL — AB (ref 70–99)
GLUCOSE-CAPILLARY: 216 mg/dL — AB (ref 70–99)
GLUCOSE-CAPILLARY: 227 mg/dL — AB (ref 70–99)
Glucose-Capillary: 265 mg/dL — ABNORMAL HIGH (ref 70–99)
Glucose-Capillary: 261 mg/dL — ABNORMAL HIGH (ref 70–99)
Glucose-Capillary: 224 mg/dL — ABNORMAL HIGH (ref 70–99)
Glucose-Capillary: 235 mg/dL — ABNORMAL HIGH (ref 70–99)

## 2018-07-20 LAB — POCT I-STAT 7, (LYTES, BLD GAS, ICA,H+H)
ACID-BASE DEFICIT: 1 mmol/L (ref 0.0–2.0)
ACID-BASE DEFICIT: 2 mmol/L (ref 0.0–2.0)
BICARBONATE: 22.6 mmol/L (ref 20.0–28.0)
Bicarbonate: 24.3 mmol/L (ref 20.0–28.0)
Calcium, Ion: 1.2 mmol/L (ref 1.15–1.40)
Calcium, Ion: 1.21 mmol/L (ref 1.15–1.40)
HCT: 32 % — ABNORMAL LOW (ref 36.0–46.0)
HEMATOCRIT: 35 % — AB (ref 36.0–46.0)
HEMOGLOBIN: 11.9 g/dL — AB (ref 12.0–15.0)
HEMOGLOBIN: 10.9 g/dL — AB (ref 12.0–15.0)
O2 SAT: 70 %
O2 Saturation: 100 %
PCO2 ART: 43.1 mmHg (ref 32.0–48.0)
PH ART: 7.36 (ref 7.350–7.450)
PH ART: 7.381 (ref 7.350–7.450)
PO2 ART: 251 mmHg — AB (ref 83.0–108.0)
POTASSIUM: 4.5 mmol/L (ref 3.5–5.1)
Potassium: 4.5 mmol/L (ref 3.5–5.1)
Sodium: 139 mmol/L (ref 135–145)
Sodium: 139 mmol/L (ref 135–145)
TCO2: 26 mmol/L (ref 22–32)
TCO2: 24 mmol/L (ref 22–32)
pCO2 arterial: 38 mmHg (ref 32.0–48.0)
pO2, Arterial: 38 mmHg — CL (ref 83.0–108.0)

## 2018-07-20 LAB — COMPREHENSIVE METABOLIC PANEL
ALBUMIN: 3.3 g/dL — AB (ref 3.5–5.0)
ALT: 19 U/L (ref 0–44)
ANION GAP: 10 (ref 5–15)
AST: 26 U/L (ref 15–41)
Alkaline Phosphatase: 56 U/L (ref 38–126)
BILIRUBIN TOTAL: 0.3 mg/dL (ref 0.3–1.2)
BUN: 12 mg/dL (ref 8–23)
CALCIUM: 9.2 mg/dL (ref 8.9–10.3)
CO2: 19 mmol/L — ABNORMAL LOW (ref 22–32)
Chloride: 108 mmol/L (ref 98–111)
Creatinine, Ser: 0.77 mg/dL (ref 0.44–1.00)
GLUCOSE: 275 mg/dL — AB (ref 70–99)
Potassium: 4.5 mmol/L (ref 3.5–5.1)
Sodium: 137 mmol/L (ref 135–145)
TOTAL PROTEIN: 6.9 g/dL (ref 6.5–8.1)

## 2018-07-20 SURGERY — MASTECTOMY, MODIFIED RADICAL
Anesthesia: Regional | Site: Breast | Laterality: Bilateral

## 2018-07-20 MED ORDER — DEXAMETHASONE SODIUM PHOSPHATE 10 MG/ML IJ SOLN
INTRAMUSCULAR | Status: DC | PRN
  Administered 2018-07-20: 4 mg via INTRAVENOUS

## 2018-07-20 MED ORDER — CHLORHEXIDINE GLUCONATE CLOTH 2 % EX PADS: 6.0000 | MEDICATED_PAD | Freq: Once | CUTANEOUS | Status: DC

## 2018-07-20 MED ORDER — TRAMADOL HCL 50 MG PO TABS
ORAL_TABLET | ORAL | Status: AC
  Administered 2018-07-20: 50 mg
  Filled 2018-07-20: qty 1

## 2018-07-20 MED ORDER — LOSARTAN POTASSIUM 50 MG PO TABS
50.0000 mg | ORAL_TABLET | Freq: Every day | ORAL | Status: DC
  Administered 2018-07-21 – 2018-07-22 (×2): 50 mg via ORAL
  Filled 2018-07-20 (×3): qty 1

## 2018-07-20 MED ORDER — GABAPENTIN 300 MG PO CAPS
300.0000 mg | ORAL_CAPSULE | ORAL | Status: AC
  Administered 2018-07-20: 300 mg via ORAL
  Filled 2018-07-20: qty 1

## 2018-07-20 MED ORDER — ONDANSETRON HCL 4 MG/2ML IJ SOLN
INTRAMUSCULAR | Status: AC
  Filled 2018-07-20: qty 4

## 2018-07-20 MED ORDER — ACETAMINOPHEN 500 MG PO TABS
1000.0000 mg | ORAL_TABLET | Freq: Four times a day (QID) | ORAL | Status: DC
  Administered 2018-07-20 – 2018-07-22 (×7): 1000 mg via ORAL
  Filled 2018-07-20 (×7): qty 2

## 2018-07-20 MED ORDER — LACTATED RINGERS IV SOLN
INTRAVENOUS | Status: DC
  Administered 2018-07-20 (×2): via INTRAVENOUS

## 2018-07-20 MED ORDER — FENTANYL CITRATE (PF) 250 MCG/5ML IJ SOLN
INTRAMUSCULAR | Status: AC
  Filled 2018-07-20: qty 5

## 2018-07-20 MED ORDER — NITROGLYCERIN 0.4 MG SL SUBL: 0.4000 mg | SUBLINGUAL_TABLET | SUBLINGUAL | Status: DC | PRN

## 2018-07-20 MED ORDER — HEMOSTATIC AGENTS (NO CHARGE) OPTIME
TOPICAL | Status: DC | PRN
  Administered 2018-07-20: 1 via TOPICAL

## 2018-07-20 MED ORDER — GABAPENTIN 300 MG PO CAPS
300.0000 mg | ORAL_CAPSULE | Freq: Every day | ORAL | Status: DC
  Administered 2018-07-20 – 2018-07-21 (×2): 300 mg via ORAL
  Filled 2018-07-20 (×2): qty 1

## 2018-07-20 MED ORDER — TRAMADOL HCL 50 MG PO TABS
50.0000 mg | ORAL_TABLET | Freq: Four times a day (QID) | ORAL | Status: DC | PRN
  Administered 2018-07-21 – 2018-07-22 (×2): 50 mg via ORAL
  Filled 2018-07-20 (×3): qty 1

## 2018-07-20 MED ORDER — MELOXICAM 7.5 MG PO TABS
7.5000 mg | ORAL_TABLET | Freq: Every day | ORAL | Status: DC | PRN
  Filled 2018-07-20: qty 1

## 2018-07-20 MED ORDER — AMLODIPINE BESYLATE 10 MG PO TABS
10.0000 mg | ORAL_TABLET | Freq: Every day | ORAL | Status: DC
  Administered 2018-07-21 – 2018-07-22 (×2): 10 mg via ORAL
  Filled 2018-07-20 (×2): qty 1

## 2018-07-20 MED ORDER — HEPARIN SODIUM (PORCINE) 5000 UNIT/ML IJ SOLN
5000.0000 [IU] | Freq: Three times a day (TID) | INTRAMUSCULAR | Status: DC
  Administered 2018-07-21 – 2018-07-22 (×3): 5000 [IU] via SUBCUTANEOUS
  Filled 2018-07-20 (×3): qty 1

## 2018-07-20 MED ORDER — POTASSIUM CHLORIDE IN NACL 20-0.9 MEQ/L-% IV SOLN
INTRAVENOUS | Status: DC
  Administered 2018-07-20: 18:00:00 via INTRAVENOUS
  Filled 2018-07-20: qty 1000

## 2018-07-20 MED ORDER — DEXAMETHASONE SODIUM PHOSPHATE 10 MG/ML IJ SOLN
INTRAMUSCULAR | Status: AC
  Filled 2018-07-20: qty 2

## 2018-07-20 MED ORDER — ROPIVACAINE HCL 5 MG/ML IJ SOLN
INTRAMUSCULAR | Status: DC | PRN
  Administered 2018-07-20 (×2): 19 mL via PERINEURAL

## 2018-07-20 MED ORDER — ROCURONIUM BROMIDE 10 MG/ML (PF) SYRINGE
PREFILLED_SYRINGE | INTRAVENOUS | Status: DC | PRN
  Administered 2018-07-20: 50 mg via INTRAVENOUS

## 2018-07-20 MED ORDER — LIDOCAINE 2% (20 MG/ML) 5 ML SYRINGE
INTRAMUSCULAR | Status: AC
  Filled 2018-07-20: qty 10

## 2018-07-20 MED ORDER — SODIUM CHLORIDE 0.9 % IV SOLN
INTRAVENOUS | Status: DC | PRN
  Administered 2018-07-20: 75 ug/min via INTRAVENOUS
  Administered 2018-07-20: 25 ug/min via INTRAVENOUS

## 2018-07-20 MED ORDER — PHENYLEPHRINE 40 MCG/ML (10ML) SYRINGE FOR IV PUSH (FOR BLOOD PRESSURE SUPPORT)
PREFILLED_SYRINGE | INTRAVENOUS | Status: DC | PRN
  Administered 2018-07-20: 80 ug via INTRAVENOUS
  Administered 2018-07-20 (×2): 40 ug via INTRAVENOUS
  Administered 2018-07-20: 80 ug via INTRAVENOUS
  Administered 2018-07-20: 40 ug via INTRAVENOUS

## 2018-07-20 MED ORDER — GUAIFENESIN-DM 100-10 MG/5ML PO SYRP: 10.0000 mL | ORAL_SOLUTION | ORAL | Status: DC | PRN

## 2018-07-20 MED ORDER — ALUM & MAG HYDROXIDE-SIMETH 200-200-20 MG/5ML PO SUSP: 30.0000 mL | Freq: Four times a day (QID) | ORAL | Status: DC | PRN

## 2018-07-20 MED ORDER — FENTANYL CITRATE (PF) 100 MCG/2ML IJ SOLN: 25.0000 ug | INTRAMUSCULAR | Status: DC | PRN

## 2018-07-20 MED ORDER — 0.9 % SODIUM CHLORIDE (POUR BTL) OPTIME
TOPICAL | Status: DC | PRN
  Administered 2018-07-20 (×2): 1000 mL

## 2018-07-20 MED ORDER — ONDANSETRON HCL 4 MG/2ML IJ SOLN
INTRAMUSCULAR | Status: DC | PRN
  Administered 2018-07-20: 4 mg via INTRAVENOUS

## 2018-07-20 MED ORDER — PROCHLORPERAZINE MALEATE 10 MG PO TABS
10.0000 mg | ORAL_TABLET | Freq: Four times a day (QID) | ORAL | Status: DC | PRN
  Filled 2018-07-20: qty 1

## 2018-07-20 MED ORDER — ONDANSETRON HCL 4 MG/2ML IJ SOLN
4.0000 mg | Freq: Four times a day (QID) | INTRAMUSCULAR | Status: DC | PRN
  Administered 2018-07-21: 4 mg via INTRAVENOUS
  Filled 2018-07-20: qty 2

## 2018-07-20 MED ORDER — ONDANSETRON 4 MG PO TBDP: 4.0000 mg | ORAL_TABLET | Freq: Four times a day (QID) | ORAL | Status: DC | PRN

## 2018-07-20 MED ORDER — INSULIN ASPART 100 UNIT/ML ~~LOC~~ SOLN
SUBCUTANEOUS | Status: AC
  Administered 2018-07-20: 5 [IU] via SUBCUTANEOUS
  Filled 2018-07-20: qty 1

## 2018-07-20 MED ORDER — PROPOFOL 10 MG/ML IV BOLUS
INTRAVENOUS | Status: DC | PRN
  Administered 2018-07-20: 140 mg via INTRAVENOUS

## 2018-07-20 MED ORDER — INSULIN ASPART 100 UNIT/ML ~~LOC~~ SOLN
0.0000 [IU] | Freq: Three times a day (TID) | SUBCUTANEOUS | Status: DC
  Administered 2018-07-20: 5 [IU] via SUBCUTANEOUS
  Administered 2018-07-21: 3 [IU] via SUBCUTANEOUS
  Administered 2018-07-21: 2 [IU] via SUBCUTANEOUS
  Administered 2018-07-21: 3 [IU] via SUBCUTANEOUS
  Administered 2018-07-22: 2 [IU] via SUBCUTANEOUS

## 2018-07-20 MED ORDER — MIDAZOLAM HCL 5 MG/5ML IJ SOLN
INTRAMUSCULAR | Status: DC | PRN
  Administered 2018-07-20 (×2): 1 mg via INTRAVENOUS

## 2018-07-20 MED ORDER — ROCURONIUM BROMIDE 50 MG/5ML IV SOSY
PREFILLED_SYRINGE | INTRAVENOUS | Status: AC
  Filled 2018-07-20: qty 10

## 2018-07-20 MED ORDER — PHENYLEPHRINE 40 MCG/ML (10ML) SYRINGE FOR IV PUSH (FOR BLOOD PRESSURE SUPPORT)
PREFILLED_SYRINGE | INTRAVENOUS | Status: AC
  Filled 2018-07-20: qty 10

## 2018-07-20 MED ORDER — INSULIN ASPART 100 UNIT/ML ~~LOC~~ SOLN
5.0000 [IU] | Freq: Once | SUBCUTANEOUS | Status: AC
  Administered 2018-07-20: 5 [IU] via SUBCUTANEOUS

## 2018-07-20 MED ORDER — MENTHOL 3 MG MT LOZG: 1.0000 | LOZENGE | OROMUCOSAL | Status: DC | PRN

## 2018-07-20 MED ORDER — SUGAMMADEX SODIUM 200 MG/2ML IV SOLN
INTRAVENOUS | Status: DC | PRN
  Administered 2018-07-20: 200 mg via INTRAVENOUS

## 2018-07-20 MED ORDER — LIDOCAINE 2% (20 MG/ML) 5 ML SYRINGE
INTRAMUSCULAR | Status: DC | PRN
  Administered 2018-07-20: 80 mg via INTRAVENOUS

## 2018-07-20 MED ORDER — PROPOFOL 10 MG/ML IV BOLUS
INTRAVENOUS | Status: AC
  Filled 2018-07-20: qty 20

## 2018-07-20 MED ORDER — BACITRACIN-NEOMYCIN-POLYMYXIN 400-5-5000 EX OINT
TOPICAL_OINTMENT | CUTANEOUS | Status: AC
  Filled 2018-07-20: qty 1

## 2018-07-20 MED ORDER — PANTOPRAZOLE SODIUM 40 MG PO TBEC
40.0000 mg | DELAYED_RELEASE_TABLET | Freq: Every day | ORAL | Status: DC
  Administered 2018-07-21 – 2018-07-22 (×2): 40 mg via ORAL
  Filled 2018-07-20 (×2): qty 1

## 2018-07-20 MED ORDER — CEFAZOLIN SODIUM-DEXTROSE 2-4 GM/100ML-% IV SOLN
2.0000 g | INTRAVENOUS | Status: AC
  Administered 2018-07-20: 2 g via INTRAVENOUS
  Filled 2018-07-20: qty 100

## 2018-07-20 MED ORDER — CELECOXIB 200 MG PO CAPS
200.0000 mg | ORAL_CAPSULE | ORAL | Status: AC
  Administered 2018-07-20: 200 mg via ORAL
  Filled 2018-07-20: qty 1

## 2018-07-20 MED ORDER — MIDAZOLAM HCL 2 MG/2ML IJ SOLN
INTRAMUSCULAR | Status: AC
  Filled 2018-07-20: qty 2

## 2018-07-20 MED ORDER — ISOSORBIDE MONONITRATE ER 60 MG PO TB24
240.0000 mg | ORAL_TABLET | Freq: Every day | ORAL | Status: DC
  Administered 2018-07-20: 120 mg via ORAL
  Administered 2018-07-21: 240 mg via ORAL
  Filled 2018-07-20 (×4): qty 4

## 2018-07-20 MED ORDER — METOPROLOL SUCCINATE ER 100 MG PO TB24
100.0000 mg | ORAL_TABLET | Freq: Every day | ORAL | Status: DC
  Administered 2018-07-21 – 2018-07-22 (×2): 100 mg via ORAL
  Filled 2018-07-20 (×3): qty 1

## 2018-07-20 MED ORDER — FENTANYL CITRATE (PF) 250 MCG/5ML IJ SOLN
INTRAMUSCULAR | Status: DC | PRN
  Administered 2018-07-20: 50 ug via INTRAVENOUS
  Administered 2018-07-20: 100 ug via INTRAVENOUS
  Administered 2018-07-20 (×3): 50 ug via INTRAVENOUS

## 2018-07-20 MED ORDER — INSULIN ASPART 100 UNIT/ML ~~LOC~~ SOLN
SUBCUTANEOUS | Status: AC
  Filled 2018-07-20: qty 1

## 2018-07-20 MED ORDER — PHENOL 1.4 % MT LIQD
1.0000 | OROMUCOSAL | Status: DC | PRN
  Administered 2018-07-20: 1 via OROMUCOSAL
  Filled 2018-07-20: qty 177

## 2018-07-20 MED ORDER — SPIRONOLACTONE 25 MG PO TABS
25.0000 mg | ORAL_TABLET | Freq: Every day | ORAL | Status: DC
  Administered 2018-07-21 – 2018-07-22 (×2): 25 mg via ORAL
  Filled 2018-07-20 (×2): qty 1

## 2018-07-20 MED ORDER — METFORMIN HCL 500 MG PO TABS
1000.0000 mg | ORAL_TABLET | Freq: Two times a day (BID) | ORAL | Status: DC
  Administered 2018-07-20 – 2018-07-22 (×4): 1000 mg via ORAL
  Filled 2018-07-20 (×5): qty 2

## 2018-07-20 SURGICAL SUPPLY — 53 items
ADH SKN CLS APL DERMABOND .7 (GAUZE/BANDAGES/DRESSINGS) ×2
APPLIER CLIP 9.375 MED OPEN (MISCELLANEOUS) ×6
APR CLP MED 9.3 20 MLT OPN (MISCELLANEOUS) ×2
BINDER BREAST XXLRG (GAUZE/BANDAGES/DRESSINGS) ×2 IMPLANT
BIOPATCH RED 1 DISK 7.0 (GAUZE/BANDAGES/DRESSINGS) ×6 IMPLANT
BIOPATCH RED 1IN DISK 7.0MM (GAUZE/BANDAGES/DRESSINGS) ×4
CANISTER SUCT 3000ML PPV (MISCELLANEOUS) ×5 IMPLANT
CHLORAPREP W/TINT 26ML (MISCELLANEOUS) ×5 IMPLANT
CLIP APPLIE 9.375 MED OPEN (MISCELLANEOUS) ×1 IMPLANT
CONT SPEC 4OZ CLIKSEAL STRL BL (MISCELLANEOUS) ×8 IMPLANT
COVER SURGICAL LIGHT HANDLE (MISCELLANEOUS) ×3 IMPLANT
COVER WAND RF STERILE (DRAPES) ×3 IMPLANT
DERMABOND ADVANCED (GAUZE/BANDAGES/DRESSINGS) ×4
DERMABOND ADVANCED .7 DNX12 (GAUZE/BANDAGES/DRESSINGS) ×1 IMPLANT
DEVICE DISSECT PLASMABLAD 3.0S (MISCELLANEOUS) IMPLANT
DRAIN CHANNEL 19F RND (DRAIN) ×10 IMPLANT
DRAPE HALF SHEET 40X57 (DRAPES) ×4 IMPLANT
DRAPE ORTHO SPLIT 77X108 STRL (DRAPES) ×3
DRAPE SURG ORHT 6 SPLT 77X108 (DRAPES) IMPLANT
DRSG PAD ABDOMINAL 8X10 ST (GAUZE/BANDAGES/DRESSINGS) ×8 IMPLANT
DRSG TEGADERM 4X4.75 (GAUZE/BANDAGES/DRESSINGS) ×6 IMPLANT
ELECT REM PT RETURN 9FT ADLT (ELECTROSURGICAL) ×6
ELECTRODE REM PT RTRN 9FT ADLT (ELECTROSURGICAL) ×1 IMPLANT
EVACUATOR SILICONE 100CC (DRAIN) ×10 IMPLANT
GAUZE SPONGE 4X4 12PLY STRL (GAUZE/BANDAGES/DRESSINGS) ×5 IMPLANT
GLOVE BIO SURGEON STRL SZ7.5 (GLOVE) ×3 IMPLANT
GOWN STRL REUS W/ TWL LRG LVL3 (GOWN DISPOSABLE) ×2 IMPLANT
GOWN STRL REUS W/TWL LRG LVL3 (GOWN DISPOSABLE) ×9
HEMOSTAT SNOW SURGICEL 2X4 (HEMOSTASIS) ×2 IMPLANT
KIT BASIN OR (CUSTOM PROCEDURE TRAY) ×3 IMPLANT
KIT TURNOVER KIT B (KITS) ×3 IMPLANT
LIGHT WAVEGUIDE WIDE FLAT (MISCELLANEOUS) IMPLANT
NS IRRIG 1000ML POUR BTL (IV SOLUTION) ×5 IMPLANT
PACK GENERAL/GYN (CUSTOM PROCEDURE TRAY) ×3 IMPLANT
PAD ARMBOARD 7.5X6 YLW CONV (MISCELLANEOUS) ×6 IMPLANT
PIN SAFETY STERILE (MISCELLANEOUS) ×2 IMPLANT
PLASMABLADE 3.0S (MISCELLANEOUS) ×3
SPECIMEN JAR MEDIUM (MISCELLANEOUS) IMPLANT
SPECIMEN JAR X LARGE (MISCELLANEOUS) ×4 IMPLANT
SPONGE LAP 18X18 RF (DISPOSABLE) ×4 IMPLANT
SUT ETHILON 3 0 FSL (SUTURE) ×8 IMPLANT
SUT ETHILON 3 0 PS 1 (SUTURE) ×4 IMPLANT
SUT MNCRL AB 3-0 PS2 18 (SUTURE) ×2 IMPLANT
SUT MNCRL AB 4-0 PS2 18 (SUTURE) ×6 IMPLANT
SUT MON AB 4-0 PC3 18 (SUTURE) ×3 IMPLANT
SUT MON AB 5-0 PS2 18 (SUTURE) ×2 IMPLANT
SUT SILK 2 0 (SUTURE) ×3
SUT SILK 2-0 18XBRD TIE 12 (SUTURE) IMPLANT
SUT VIC AB 3-0 SH 18 (SUTURE) ×8 IMPLANT
TOWEL GREEN STERILE (TOWEL DISPOSABLE) ×2 IMPLANT
TUBE CONNECTING 12'X1/4 (SUCTIONS)
TUBE CONNECTING 12X1/4 (SUCTIONS) IMPLANT
YANKAUER SUCT BULB TIP NO VENT (SUCTIONS) ×2 IMPLANT

## 2018-07-20 NOTE — Interval H&P Note (Signed)
History and Physical Interval Note:  07/20/2018 7:32 AM  [name]  has presented today for surgery, with the diagnosis of LEFT BREAST INFLAMMATORY CANCER  The various methods of treatment have been discussed with the patient and family. After consideration of risks, benefits and other options for treatment, the patient has consented to  Procedure(s): BILATERAL MODIFIED RADICAL MASTECTOMIES (Bilateral) as a surgical intervention .  The patient's history has been reviewed, patient examined, no change in status, stable for surgery.  I have reviewed the patient's chart and labs.  Questions were answered to the patient's satisfaction.     Autumn Messing III

## 2018-07-20 NOTE — Anesthesia Postprocedure Evaluation (Signed)
Anesthesia Post Note  Patient: GWYNETH FERNANDEZ  Procedure(s) Performed: BILATERAL MODIFIED RADICAL MASTECTOMIES (Bilateral Breast)     Patient location during evaluation: PACU Anesthesia Type: Regional and General Level of consciousness: awake Pain management: pain level controlled Vital Signs Assessment: post-procedure vital signs reviewed and stable Respiratory status: spontaneous breathing, nonlabored ventilation, respiratory function stable and patient connected to nasal cannula oxygen Cardiovascular status: blood pressure returned to baseline and stable Postop Assessment: no apparent nausea or vomiting Anesthetic complications: no    Last Vitals:  Vitals:   07/20/18 1915 07/20/18 2023  BP: 136/62 135/87  Pulse:  79  Resp: 15 19  Temp: 36.7 C 36.5 C  SpO2: 98% 99%    Last Pain:  Vitals:   07/20/18 2023  TempSrc: Oral  PainSc:                  Karyl Kinnier Luvia Orzechowski

## 2018-07-20 NOTE — Progress Notes (Signed)
Pt received from PACU. A/Ox4. VSS. Telemetry applied. Pt and family oriented to room and unit. Will continue to monitor.  Clyde Canterbury, RN

## 2018-07-20 NOTE — Op Note (Signed)
07/20/2018  11:45 AM  PATIENT:  [name]  67 y.o. female  PRE-OPERATIVE DIAGNOSIS:  LEFT BREAST INFLAMMATORY CANCER  POST-OPERATIVE DIAGNOSIS:  LEFT BREAST INFLAMMATORY CANCER  PROCEDURE:  Procedure(s): BILATERAL MODIFIED RADICAL MASTECTOMIES (Bilateral)  SURGEON:  Surgeon(s) and Role:    * Jovita Kussmaul, MD - Primary    * Dillingham, Loel Lofty, DO - Assisting  PHYSICIAN ASSISTANT:   ASSISTANTS: Dr. Marla Roe   ANESTHESIA:   general  EBL:  150 mL   BLOOD ADMINISTERED:none  DRAINS: (4) Jackson-Pratt drain(s) with closed bulb suction in the prepectoral space   LOCAL MEDICATIONS USED:  NONE  SPECIMEN:  Source of Specimen:  right breast and axillary contents and left breast and axillary contents with frozens of superior and inferior, medial and lateral skin flaps  DISPOSITION OF SPECIMEN:  PATHOLOGY  COUNTS:  YES  TOURNIQUET:  * No tourniquets in log *  DICTATION: .Dragon Dictation   After informed consent was obtained the patient was brought to the operating room and placed in the supine position on the operating table.  After adequate induction of general anesthesia the patient's bilateral chest, breast, and axillary areas were prepped with ChloraPrep, allowed to dry, and draped in usual sterile manner.  An appropriate timeout was performed.  Attention was first turned to the right breast.  An elliptical incision was made around the nipple and areola complex in order to minimize the excess skin.  The incision was carried through the skin and subcutaneous tissue sharply with the plasma blade.  Breast hooks were used to elevate the skin flaps anteriorly towards the saline.  Thin skin flaps were then created circumferentially between the breast tissue in the subcutaneous fat.  This dissection was carried all the way to the chest wall circumferentially.  Next the breast was removed from the pectoralis muscle with the pectoralis fascia.  Once the dissection reached the  lateral chest wall and axilla then the serratus muscle medially, the latissimus muscle laterally, and the axillary vein superiorly were identified.  The lymphatic contents within these boundaries was then dissected out with blunt right angle dissection.  Several small vessels and lymphatics were controlled with clips.  The long thoracic and thoracodorsal nerves were identified and spared.  Once this dissection was complete the entire axillary contents and breast were removed and sent to the pathology for further evaluation with a stitch on the lateral skin.  The wound was irrigated with copious amounts of saline.  There was some small amount of oozing of blood from high in the right axilla and this was controlled with pressure and Surgicel snow.  2 small stab incisions were made near the anterior axillary line inferior to the operative bed.  A tonsil clamp was placed through each of these openings and used to bring a 19 [place] round Blake drain into the operative bed.  The lateral drain was placed in the axilla and the medial drain was curled along the chest wall.  The drains were anchored to the skin with 3-0 nylon stitches.  Next the superior and inferior skin flaps were grossly reapproximated with interrupted 3-0 Vicryl stitches.  The skin was then closed with a running 4-0 Monocryl subcuticular stitch.  The drains were placed to bulb suction and there was a good seal.  The skin flaps were healthy appearing.  Attention was then turned to the left breast.  The patient had a history of inflammatory cancer with nodules in the skin of the left breast.  A large elliptical incision was made around the abnormalities in the skin with a 10 blade knife.  The incision was carried through the skin and subcutaneous tissue sharply with the plasma blade.  Breast hooks were used to elevate the skin flaps anteriorly towards the saline.  Thin skin flaps then created circumferentially between the breast tissue and subcutaneous fat.   The dissection was carried circumferentially all the way to the chest wall.  There appeared to be a lot of fibrotic reaction in the breast tissue and surrounding soft tissue.  Next the breast was removed from the pectoralis muscle with the pectoralis fascia.  Again the tissue was adherent to the muscle and some fibers of muscle were taken with the breast.  Once this dissection reached the lateral chest wall then the serratus muscle medially, the latissimus muscle laterally, and the axillary vein superiorly were identified.  The lymphatic contents within these boundaries was then dissected out by blunt right angle dissection.  Several small vessels and lymphatics were controlled with clips.  The long thoracic and thoracodorsal nerves were identified and spared.  Once this dissection was complete then the entire breast and axillary contents with a stitch on the lateral skin were removed and sent to pathology for further evaluation.  The wound was irrigated with copious amounts of saline and hemostasis was achieved using the plasma blade.  We then had to mobilize the skin and subcutaneous tissue of the chest wall and abdominal wall by undermining up to the clavicle and down onto the abdominal wall to allow enough mobility of the skin edges to be brought together.  2 small stab incisions were made near the anterior axillary line inferior to the operative bed.  A tonsil clamp was placed through each of these openings and used to bring a 19 [place] round Blake drain into the operative bed.  The lateral drain was placed in the axilla and the medial drain was curled along the chest wall.  The drains were anchored to the skin with 3-0 nylon stitches.  Laterally the superior and inferior skin flaps were grossly reapproximated with interrupted 3-0 Vicryl stitches.  Medially because of the tension the superior and inferior skin flaps were approximated with 3-0 nylon vertical mattress stitches.  We were able to get the skin  edges together.  The skin incision was then closed with running 4-0 Monocryl subcuticular stitches.  The skin flaps appeared healthy.  The drains were placed to bulb suction and there was a good seal.  The right incision was covered with Dermabond.  The left-sided incision was covered with triple antibiotic ointment and 4 x 4 gauze.  The patient tolerated the procedure well.  At the end of the case all needle sponge and instrument counts were correct.  The patient was then awakened and taken to recovery in stable condition.  PLAN OF CARE: Admit for overnight observation  PATIENT DISPOSITION:  PACU - hemodynamically stable.   Delay start of Pharmacological VTE agent (>24hrs) due to surgical blood loss or risk of bleeding: no

## 2018-07-20 NOTE — Consult Note (Addendum)
Neurology Consultation  Reason for Consult: Code stroke  Referring Physician: Dr. Marlou Starks  CC: Not waking up after surgery  History is obtained from: Nurse  HPI: [name] is a 67 y.o. female hyperlipidemia, essential hypertension, diabetes, CAD.  Patient was admitted today for a bilateral modified radical mastectomy.  Apparently she was doing well postoperatively and then became unresponsive per staff at approximately 1340.  Code stroke was called secondary to patient not waking up.  Upon arriving patient was slowly waking up, able to move her eyes and track finger movements.  Within approximately 5 to10 minutes after Neurology arrival, the patient was able to count fingers and follow commands with no localizing or lateralizing signs.  LKW: 1340 tpa given?: no, patient waking up as consult was being done, with no localizing or lateralizing signs Premorbid modified Rankin scale (mRS): 0 NIH stroke scale 0   ROS:  Unable to obtain due to altered mental status.   Past Medical History:  Diagnosis Date  . Arthritis    "knees, lower back" (08/18/2017)  . 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 S/p lumpectomy and radiation.  Marland Kitchen CAD (coronary artery disease) 11/07   cath 11/11 :  3V CADsee report   . Cataract    small both eyes  . Chronic lower back pain   . Chronic pain    MR 08/07/10 :  Spondylosis L4-5 with B foraminal narrowing and L4 nerve root enchroachment B.  . Chronic venous insufficiency 2011/01/17   Has had full W/U incl ECHO, LFT's, Cr, Umicro, TSH, BNP. Symptomatic treatment.  . Colonic polyp 1995   Colonoscopies 1994, 2000, and 02/02/2011. Last had 9 polyps - Tubular adenoma and tubulovillous was the pathology results without high grade dysplasia  . Diabetes mellitus type II    Insulin dependent. Has worked with [name] R previously.  . Diastolic CHF, chronic ([place])    NL EF by echo 01/2012, Gr I dd  . Essential hypertension    Requires 5 drug therapy and still difficult to control.  Marland Kitchen GERD (gastroesophageal reflux disease)   . Heart murmur   . Hyperlipidemia    On daily statin  . Iron deficiency anemia    Ferritin was 12 in 17-Jan-2011. on FeSo4. Has had colon and EGD 02/02/11 that showed mild gastritis and colon polyps.  . Obesity    Morbid. HAs worked with [name] T previously.  . OSA (obstructive sleep apnea) 02/16/2007   02/2007 : Moderate AHI 35.6, O2 sat decreased to 65%, CPAP titration to 16 with AHI of 3.6. 02/2014 : Rare respiratory events with sleep disturbance, within normal limits. AHI 0.4 per hour      . OSA on CPAP   . Personal history of chemotherapy   . Personal history of radiation therapy    "to my left breast"  . Refusal of blood transfusions as patient is Jehovah's Witness   . Seasonal allergies    "grass pollen"    Family History  Problem Relation Age of Onset  . Lung cancer Mother   . Diabetes Father   . Breast cancer Sister   . Pulmonary embolism Brother   . Depression Daughter 74  . Osteoarthritis Maternal Grandmother   . Heart attack Brother 18  . Kidney failure Brother        died 01-17-16 2/2 kidney and heart failure  . Colon cancer Neg Hx   . Colon polyps Neg Hx   .  Esophageal cancer Neg Hx   . Rectal cancer Neg Hx   . Stomach cancer Neg Hx     Social History:   reports that she has never smoked. She has never used smokeless tobacco. She reports that she does not drink alcohol or use drugs.  Medications  Current Facility-Administered Medications:  .  6 CHG cloth bath night before surgery, , , Once **AND** [START ON 07/21/2018] 6 CHG cloth bath AM of surgery, , , Once **AND** Chlorhexidine Gluconate Cloth 2 % PADS 6 each, 6 each, Topical, Once **AND** Chlorhexidine Gluconate Cloth 2 % PADS 6 each, 6 each, Topical, Once, Autumn Messing III, MD .  fentaNYL (SUBLIMAZE) injection 25-50 mcg, 25-50 mcg, Intravenous, Q5 min PRN, Ellender, Ryan P, MD .  insulin aspart (novoLOG) injection 0-9  Units, 0-9 Units, Subcutaneous, TID WC, Autumn Messing III, MD .  lactated ringers infusion, , Intravenous, Continuous, Ellender, Karyl Kinnier, MD, Last Rate: 50 mL/hr at 07/20/18 0728   Exam: Current vital signs: BP (!) 155/76 (BP Location: Right Arm)   Pulse 87   Temp (!) 97.5 F (36.4 C)   Resp 15   Ht 5\' 1"  (1.549 m)   Wt 103.9 kg   LMP 07/12/1984   SpO2 97%   BMI 43.28 kg/m  Vital signs in last 24 hours: Temp:  [97.5 F (36.4 C)-98.3 F (36.8 C)] 97.5 F (36.4 C) (10/17 1205) Pulse Rate:  [86-95] 87 (10/17 1350) Resp:  [12-23] 15 (10/17 1350) BP: (140-163)/(69-82) 155/76 (10/17 1350) SpO2:  [90 %-99 %] 97 % (10/17 1350) Weight:  [103.9 kg] 103.9 kg (10/17 2703)  Physical Exam  Constitutional: Appears well-developed and well-nourished.  Psych: Affect appropriate to waking up from anesthesia Eyes: No scleral injection HENT: No OP obstrucion Head: Normocephalic.  Cardiovascular: Normal rate and regular rhythm.  Respiratory: Effort normal, non-labored breathing GI: Soft.  No distension. There is no tenderness.  Skin: WDI  Neurological Exam: Mental Status: Patient is awake, alert, still very drowsy from anesthesia. No signs of aphasia or neglect Cranial Nerves: II: Visual Fields are full. Pupils are equal, round, and reactive to light.   III,IV, VI: EOMI without ptosis or diplopia.  V: Facial sensation is symmetric to temperature VII: Facial movement is symmetric.  VIII: hearing is intact to voice X: Uvula elevates symmetrically XI: Shoulder shrug is symmetric. XII: tongue is midline without atrophy or fasciculations.  Motor: Tone is normal. Bulk is normal. 3-4/5 strength was present in all four extremities without asymmetry.  Sensory: Sensation is symmetric to light touch and temperature in the arms and legs. Deep Tendon Reflexes: Hypoactive throughout, trace to 1+ without asymmetry Plantars: Mute bilaterally Cerebellar: Finger-nose showed no ataxia bilaterally.  Unable to perform H-S   Labs I have reviewed labs in epic and the results pertinent to this consultation are:   CBC    Component Value Date/Time   WBC 5.7 07/13/2018 1324   RBC 3.58 (L) 07/13/2018 1324   HGB 10.7 (L) 07/13/2018 1324   HGB 10.0 (L) 06/20/2018 1123   HGB 12.1 12/22/2017 1102   HCT 34.7 (L) 07/13/2018 1324   HCT 36.4 12/22/2017 1102   PLT 262 07/13/2018 1324   PLT 257 06/20/2018 1123   PLT 259 12/22/2017 1102   MCV 96.9 07/13/2018 1324   MCV 88 12/22/2017 1102   MCH 29.9 07/13/2018 1324   MCHC 30.8 07/13/2018 1324   RDW 19.5 (H) 07/13/2018 1324   RDW 15.9 (H) 12/22/2017 1102   LYMPHSABS 1.5  06/20/2018 1123   LYMPHSABS 3.4 (H) 12/22/2017 1102   MONOABS 0.8 06/20/2018 1123   EOSABS 0.1 06/20/2018 1123   EOSABS 0.2 12/22/2017 1102   BASOSABS 0.0 06/20/2018 1123   BASOSABS 0.0 12/22/2017 1102    CMP     Component Value Date/Time   NA 140 07/13/2018 1324   NA 145 (H) 06/23/2018 1025   K 3.7 07/13/2018 1324   CL 107 07/13/2018 1324   CO2 22 07/13/2018 1324   GLUCOSE 198 (H) 07/13/2018 1324   BUN 17 07/13/2018 1324   BUN 16 06/23/2018 1025   CREATININE 1.22 (H) 07/13/2018 1324   CREATININE 1.03 (H) 06/20/2018 1123   CREATININE 0.80 10/17/2014 1106   CALCIUM 9.4 07/13/2018 1324   PROT 6.8 06/20/2018 1123   PROT 6.7 06/13/2018 1117   ALBUMIN 3.3 (L) 06/20/2018 1123   ALBUMIN 4.1 06/13/2018 1117   AST 14 (L) 06/20/2018 1123   ALT 12 06/20/2018 1123   ALKPHOS 64 06/20/2018 1123   BILITOT 0.3 06/20/2018 1123   GFRNONAA 45 (L) 07/13/2018 1324   GFRNONAA 55 (L) 06/20/2018 1123   GFRNONAA 79 10/17/2014 1106   GFRAA 52 (L) 07/13/2018 1324   GFRAA >60 06/20/2018 1123   GFRAA >89 10/17/2014 1106    Lipid Panel     Component Value Date/Time   CHOL 185 11/04/2017 1041   TRIG 78 11/04/2017 1041   HDL 57 11/04/2017 1041   CHOLHDL 3.2 11/04/2017 1041   CHOLHDL 3.7 10/17/2014 1106   VLDL 16 10/17/2014 1106   LDLCALC 112 (H) 11/04/2017 1041   LDLDIRECT  153 (H) 07/13/2011 1055     Imaging No imaging required, as patient is waking up appropriately at this point  Etta Quill PA-C [facility] [phone] M-F  (9:00 am- 5:00 PM) 07/20/2018, 2:28 PM    Assessment:  67 year old female status post bilateral modified radical mastectomy who was having difficulty waking after surgery.   1. During consultation the patient began to wake up appropriately with fluent speech, ability to follow simple motor commands and no localizing or lateralizing findings.   2. At this time it is felt that her transiently decreased level of consciousness was due to post-anesthesia confusion.   Recommendations: - At this point no imaging is indicated as there are no focal deficits on exam.   -- Please call the Neurology service if her condition does not continue to improve.  I have seen and examined the patient. I have made amendations to the documented neurological exam findings. I have formulated the assessment and recommendations.  Electronically signed: Dr. Kerney Elbe

## 2018-07-20 NOTE — Progress Notes (Signed)
Late entry note: Called to PACU for pt previously alert and oriented now non-responsive. Arrived to PACU with Dr. Jillyn Hidden and Dr. Barbarann Ehlers and pt non focal, but non responsive to sternal rub or vigorous stimulation. CBG in low 200's. Unaware of pts history prior to arrival but from PACU nursing reports that pt was alert and oriented shortly after arriving in PACU and was given no sedative medications. Code stroke called and stroke team arrived shortly thereafter. Pt slowly started to wake up, was moving all extremities and was oriented although still somewhat sleepy. Stroke team cancelled code stroke. Dr. Marlou Starks aware. Pt with hx of OSA on CPAP at home on further chart digging so respiratory called to place pt on CPAP. ABG obtained and wnl and CMP wnl. After approximately 59min on CPAP pt more alert and more responsive. Pt stable, comfortable, alert and oriented x4 and moving all ext.  Deatra Canter, MD

## 2018-07-20 NOTE — Progress Notes (Signed)
Called report to 6N at 1350. At 1355 attempted to tell pt we were going to her room, she was unresponsive.  Sternal rub and pinching done, pt still unresponsive, no movement at all.  Called anesthesia team, they decided to call code stroke.  Code stroke called at 1400.  Pt last was responsive at 1340. Prior to being unresponsive pt was oriented x4, followed commands, and communicated well. No pain/sedative medications were given in PACU.  Pt was unresponsive for approx. 5-10 minutes.  When code stroke team arrived pt was semi responsive and waking up. When they did the full stroke assessment pt was following commands, and answering appropriately, seemed only slightly groggy. Stroke team cancelled code stroke. Dr. Ola Spurr ordered ABG's and CMP were drawn. Dr Marlou Starks called and updated. Pt now ordered to go to stepdown unit for closer observation. Will continue to monitor.

## 2018-07-20 NOTE — Anesthesia Procedure Notes (Signed)
Anesthesia Regional Block: Pectoralis block   Pre-Anesthetic Checklist: ,, timeout performed, Correct Patient, Correct Site, Correct Laterality, Correct Procedure, Correct Position, site marked, Risks and benefits discussed,  Surgical consent,  Pre-op evaluation,  At surgeon's request and post-op pain management  Laterality: Left  Prep: chloraprep       Needles:  Injection technique: Single-shot  Needle Type: Echogenic Stimulator Needle     Needle Length: 10cm  Needle Gauge: 21     Additional Needles:   Procedures:,,,, ultrasound used (permanent image in chart),,,,  Narrative:  Start time: 07/20/2018 7:40 AM End time: 07/20/2018 7:50 AM Injection made incrementally with aspirations every 5 mL.  Performed by: Personally  Anesthesiologist: Murvin Natal, MD  Additional Notes: Functioning IV was confirmed and monitors were applied.  A timeout was performed. Sterile prep, hand hygiene and sterile gloves were used. A 177mm 21ga Pajunk echogenic stimulator needle was used. Negative aspiration and negative test dose prior to incremental administration of local anesthetic. The patient tolerated the procedure well.  Ultrasound guidance: relevent anatomy identified, needle position confirmed, local anesthetic spread visualized around nerve(s), vascular puncture avoided.  Image printed for medical record.

## 2018-07-20 NOTE — H&P (Signed)
[name]  Location: [facility] Surgery Patient #: [id number] DOB: 1950-12-06 Divorced / Language: [place] / Race: Black or African American, Black or [place] American Female   History of Present Illness The patient is a 67 year old female who presents for a follow-up for Breast cancer. 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 The cancer was essentially a triple negative with some weak ER positive staining and a Ki-67 of 90%. She had an MRI done this morning which showed still extensive enhancement in the left breast and now for instead of 1 abnormal-looking lymph nodes in the right axilla.   Allergies  Percocet *ANALGESICS - OPIOID*  Lisinopril *ANTIHYPERTENSIVES*  OxyCODONE HCl *ANALGESICS - OPIOID*  Atorvastatin Calcium *ANTIHYPERLIPIDEMICS*  Allergies Reconciled   Medication History  Multivitamin Adult (Oral) Active. AmLODIPine Besylate (10MG Tablet, Oral) Active. Aspirin (81MG Tablet, Oral) Active. Bilberry (Vaccinium myrtillus) (100MG Capsule, Oral) Active. CloNIDine HCl (0.2MG Tablet, Oral) Active. Furosemide (20MG Tablet, Oral) Active. Glucose (4GM Tablet Chewable, Oral) Active. Hydrocortisone (2.5% Lotion, External) Active. Ibuprofen (800MG Tablet, Oral) Active. HumuLIN 70/30 ((70-30) 100UNIT/ML Suspension, Subcutaneous) Active. Loratadine (10MG Capsule, Oral) Active. Losartan Potassium (50MG Tablet, Oral) Active. MetFORMIN HCl (1000MG Tablet, Oral) Active. Metoprolol Succinate ER (100MG Tablet ER 24HR, Oral) Active. Nitroglycerin (0.4MG Tab Sublingual, Sublingual) Active. Olopatadine HCl (0.2% Solution, Ophthalmic) Active. Polyethylene Glycol 8000 Active. Coumadin (1MG Tablet, Oral) Active. MetFORMIN HCl (500MG Tablet, Oral) Active. Losartan Potassium (100MG  Tablet, Oral) Active. NovoLOG Mix 70/30 ((70-30) 100UNIT/ML Suspension, Subcutaneous) Active. Toujeo SoloStar (300UNIT/ML Soln Pen-inj, Subcutaneous) Active. Spironolactone (25MG Tablet, Oral) Active. Medications Reconciled    Review of Systems  General Not Present- Appetite Loss, Chills, Fatigue, Fever, Night Sweats, Weight Gain and Weight Loss. Note: All other systems negative (unless as noted in HPI & included Review of Systems) Skin Not Present- Change in Wart/Mole, Dryness, Hives, Jaundice, New Lesions, Non-Healing Wounds, Rash and Ulcer. HEENT Not Present- Earache, Hearing Loss, Hoarseness, Nose Bleed, Oral Ulcers, Ringing in the Ears, Seasonal Allergies, Sinus Pain, Sore Throat, Visual Disturbances, Wears glasses/contact lenses and Yellow Eyes. Respiratory Not Present- Bloody sputum, Chronic Cough, Difficulty Breathing, Snoring and Wheezing. Breast Not Present- Breast Mass, Breast Pain, Nipple Discharge and Skin Changes. Cardiovascular Not Present- Chest Pain, Difficulty Breathing Lying Down, Leg Cramps, Palpitations, Rapid Heart Rate, Shortness of Breath and Swelling of Extremities. Gastrointestinal Not Present- Abdominal Pain, Bloating, Bloody Stool, Change in Bowel Habits, Chronic diarrhea, Constipation, Difficulty Swallowing, Excessive gas, Gets full quickly at meals, Hemorrhoids, Indigestion, Nausea, Rectal Pain and Vomiting. Female Genitourinary Not Present- Frequency, Nocturia, Painful Urination, Pelvic Pain and Urgency. Musculoskeletal Not Present- Back Pain, Joint Pain, Joint Stiffness, Muscle Pain, Muscle Weakness and Swelling of Extremities. Neurological Not Present- Decreased Memory, Fainting, Headaches, Numbness, Seizures, Tingling, Tremor, Trouble walking and Weakness. Psychiatric Not Present- Anxiety, Bipolar, Change in Sleep Pattern, Depression, Fearful and Frequent crying. Endocrine Not Present- Cold Intolerance, Excessive Hunger, Hair Changes, Heat Intolerance, Hot  flashes and New Diabetes. Hematology Not Present- Easy Bruising, Excessive bleeding, Gland problems, HIV and Persistent Infections.  Vitals  Weight: 223.4 lb Height: 61in Body Surface Area: 1.98 m Body Mass Index: 42.21 kg/m  Pain Level: 0/10 Temp.: 88F(Temporal)  Pulse: 117 (Regular)  P.OX: 97% (Room air) BP: 148/72 (Sitting, Left Arm, Standard) C/O fatigue      Physical Exam General Mental Status-Alert. General Appearance-Consistent with  stated age. Hydration-Well hydrated. Voice-Normal.  Head and Neck Head-normocephalic, atraumatic with no lesions or palpable masses. Trachea-midline. Thyroid Gland Characteristics - normal size and consistency.  Eye Eyeball - Bilateral-Extraocular movements intact. Sclera/Conjunctiva - Bilateral-No scleral icterus.  Chest and Lung Exam Chest and lung exam reveals -quiet, even and easy respiratory effort with no use of accessory muscles and on auscultation, normal breath sounds, no adventitious sounds and normal vocal resonance. Inspection Chest Wall - Normal. Back - normal.  Breast Note: There is a large palpable mass involving the central portion of the left breast. There is significant redness and thickening to the skin centrally in the left breast. There is no palpable mass in the right breast. There is no palpable axillary, supraclavicular, or cervical lymphadenopathy.   Cardiovascular Cardiovascular examination reveals -normal heart sounds, regular rate and rhythm with no murmurs and normal pedal pulses bilaterally.  Abdomen Inspection Inspection of the abdomen reveals - No Hernias. Skin - Scar - no surgical scars. Palpation/Percussion Palpation and Percussion of the abdomen reveal - Soft, Non Tender, No Rebound tenderness, No Rigidity (guarding) and No hepatosplenomegaly. Auscultation Auscultation of the abdomen reveals - Bowel sounds normal.  Neurologic Neurologic evaluation  reveals -alert and oriented x 3 with no impairment of recent or remote memory. Mental Status-Normal.  Musculoskeletal Normal Exam - Left-Upper Extremity Strength Normal and Lower Extremity Strength Normal. Normal Exam - Right-Upper Extremity Strength Normal and Lower Extremity Strength Normal.  Lymphatic Head & Neck  General Head & Neck Lymphatics: Bilateral - Description - Normal. Axillary  General Axillary Region: Bilateral - Description - Normal. Tenderness - Non Tender. Femoral & Inguinal  Generalized Femoral & Inguinal Lymphatics: Bilateral - Description - Normal. Tenderness - Non Tender.    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 She has been getting neoadjuvant chemotherapy and the cancer seems to have progressed especially in the right axilla. I've talked her extensively about the options for treatment and at this point we all favor bilateral modified radical mastectomies. There is a chance we may not be able to get the left chest wall covered given the extent of disease. If that happens she may need a temporary vacuum dressing until the margins are clear and I will need to get plastic surgery involved for possible flap. I have discussed with her in detail the risks and benefits of the operation as well as some of the technical aspects and she understands and wishes to proceed. Her last chemotherapy dose was Tuesday so we will need to wait about 4 weeks before the operation is performed.

## 2018-07-20 NOTE — Anesthesia Procedure Notes (Signed)
Anesthesia Regional Block: Pectoralis block   Pre-Anesthetic Checklist: ,, timeout performed, Correct Patient, Correct Site, Correct Laterality, Correct Procedure, Correct Position, site marked, Risks and benefits discussed,  Surgical consent,  Pre-op evaluation,  At surgeon's request and post-op pain management  Laterality: Right  Prep: chloraprep       Needles:  Injection technique: Single-shot  Needle Type: Echogenic Stimulator Needle     Needle Length: 10cm  Needle Gauge: 21     Additional Needles:   Procedures:,,,, ultrasound used (permanent image in chart),,,,  Narrative:  Start time: 07/20/2018 7:50 AM End time: 07/20/2018 7:55 AM Injection made incrementally with aspirations every 5 mL.  Performed by: Personally  Anesthesiologist: Murvin Natal, MD  Additional Notes: Functioning IV was confirmed and monitors were applied.  A timeout was performed. Sterile prep, hand hygiene and sterile gloves were used. A 183mm 21ga Pajunk echogenic stimulator needle was used. Negative aspiration and negative test dose prior to incremental administration of local anesthetic. The patient tolerated the procedure well.  Ultrasound guidance: relevent anatomy identified, needle position confirmed, local anesthetic spread visualized around nerve(s), vascular puncture avoided.  Image printed for medical record.

## 2018-07-20 NOTE — Anesthesia Preprocedure Evaluation (Addendum)
Anesthesia Evaluation  Patient identified by MRN, date of birth, ID band Patient awake    Reviewed: Allergy & Precautions, NPO status , Patient's Chart, lab work & pertinent test results, reviewed documented beta blocker date and time   Airway Mallampati: III  TM Distance: >3 FB Neck ROM: Full    Dental no notable dental hx.    Pulmonary sleep apnea and Continuous Positive Airway Pressure Ventilation ,    Pulmonary exam normal breath sounds clear to auscultation       Cardiovascular hypertension, Pt. on medications and Pt. on home beta blockers + angina + CAD and +CHF  Normal cardiovascular exam Rhythm:Regular Rate:Normal  ECG: SR, rate 92  ECHO: Left ventricle: The cavity size was normal. There was mild concentric hypertrophy. Systolic function was normal. The estimated ejection fraction was in the range of 60% to 65%. Wall motion was normal; there were no regional wall motion abnormalities. There was an increased relative contribution of atrial contraction to ventricular filling. Doppler parameters are consistent with abnormal left ventricular relaxation (grade 1 diastolic dysfunction). Mitral valve: Calcified annulus. Pulmonary arteries: Systolic pressure could not be accurately estimated.  CATH: Severe multivessel coronary disease including a segmental proximal to mid 70-80 80% LAD that was proven hemodynamically significant by FFR of 0.64. 90-95% obstruction in the first obtuse marginal, proximal circumflex 80% stenosis, mid circumflex 90% stenosis, circumflex branches beyond the first obtuse marginal quite small and not graftable. Small 70-80% ramus intermedius 40% mid body left main stenosis.  Catheter damping with engagement using the 6 [place] guide catheter. Nondominant right coronary with proximal 80-90% obstruction. Normal left ventricular function with EF estimated at 65%. Normal left ventricular filling  pressures.  Cardiologist: Minus Breeding, MD   Neuro/Psych negative neurological ROS  negative psych ROS   GI/Hepatic Neg liver ROS, GERD  Medicated and Controlled,  Endo/Other  diabetes, Insulin Dependent, Oral Hypoglycemic AgentsMorbid obesity  Renal/GU negative Renal ROS     Musculoskeletal Chronic pain   Abdominal (+) + obese,   Peds  Hematology  (+) anemia , J[facility]OVAH'S WITNESSHLD   Anesthesia Other Findings LEFT BREAST INFLAMMATORY CANCER  Reproductive/Obstetrics                           Anesthesia Physical Anesthesia Plan  ASA: IV  Anesthesia Plan: General and Regional   Post-op Pain Management: GA combined w/ Regional for post-op pain   Induction: Intravenous  PONV Risk Score and Plan: 3 and Ondansetron, Dexamethasone, Midazolam and Treatment may vary due to age or medical condition  Airway Management Planned: Oral ETT  Additional Equipment:   Intra-op Plan:   Post-operative Plan: Extubation in OR  Informed Consent: I have reviewed the patients History and Physical, chart, labs and discussed the procedure including the risks, benefits and alternatives for the proposed anesthesia with the patient or authorized representative who has indicated his/her understanding and acceptance.   Dental advisory given  Plan Discussed with: CRNA  Anesthesia Plan Comments:         Anesthesia Quick Evaluation

## 2018-07-20 NOTE — Anesthesia Procedure Notes (Signed)
Procedure Name: Intubation Date/Time: 07/20/2018 8:10 AM Performed by: Wilburn Cornelia, CRNA Pre-anesthesia Checklist: Patient identified, Emergency Drugs available, Suction available, Patient being monitored and Timeout performed Patient Re-evaluated:Patient Re-evaluated prior to induction Oxygen Delivery Method: Simple face mask Preoxygenation: Pre-oxygenation with 100% oxygen Induction Type: IV induction Laryngoscope Size: Mac and 3 Grade View: Grade I Tube type: Oral Tube size: 7.0 mm Number of attempts: 1 Airway Equipment and Method: Stylet Placement Confirmation: ETT inserted through vocal cords under direct vision,  positive ETCO2,  CO2 detector and breath sounds checked- equal and bilateral Secured at: 21 cm Tube secured with: Tape Dental Injury: Teeth and Oropharynx as per pre-operative assessment

## 2018-07-20 NOTE — Transfer of Care (Signed)
Immediate Anesthesia Transfer of Care Note  Patient: [name]  Procedure(s) Performed: BILATERAL MODIFIED RADICAL MASTECTOMIES (Bilateral Breast)  Patient Location: PACU  Anesthesia Type:General  Level of Consciousness: sedated  Airway & Oxygen Therapy: Patient Spontanous Breathing and Patient connected to face mask oxygen  Post-op Assessment: Report given to RN and Post -op Vital signs reviewed and stable  Post vital signs: Reviewed and stable  Last Vitals:  Vitals Value Taken Time  BP 140/73 07/20/2018 12:05 PM  Temp    Pulse 90 07/20/2018 12:12 PM  Resp 14 07/20/2018 12:12 PM  SpO2 90 % 07/20/2018 12:12 PM  Vitals shown include unvalidated device data.  Last Pain:  Vitals:   07/20/18 0717  TempSrc:   PainSc: 0-No pain      Patients Stated Pain Goal: 3 (08/02/12 1438)  Complications: No apparent anesthesia complications

## 2018-07-20 NOTE — Code Documentation (Signed)
67yo female s/p bilateral modified radical mastectomies today who was reportedly doing well post-op until she became unresponsive at 1340 per staff. Code stroke activated. Stroke team to the bedside. Patient awake and following commands. No focal deficits on exam. No CT at this time per Dr. Cheral Marker. Code stroke canceled.

## 2018-07-21 ENCOUNTER — Encounter (HOSPITAL_COMMUNITY): Payer: Self-pay | Admitting: General Surgery

## 2018-07-21 DIAGNOSIS — I110 Hypertensive heart disease with heart failure: Secondary | ICD-10-CM | POA: Diagnosis not present

## 2018-07-21 DIAGNOSIS — C773 Secondary and unspecified malignant neoplasm of axilla and upper limb lymph nodes: Secondary | ICD-10-CM | POA: Diagnosis not present

## 2018-07-21 DIAGNOSIS — I5032 Chronic diastolic (congestive) heart failure: Secondary | ICD-10-CM | POA: Diagnosis not present

## 2018-07-21 DIAGNOSIS — Z171 Estrogen receptor negative status [ER-]: Secondary | ICD-10-CM | POA: Diagnosis not present

## 2018-07-21 DIAGNOSIS — C50112 Malignant neoplasm of central portion of left female breast: Secondary | ICD-10-CM | POA: Diagnosis not present

## 2018-07-21 DIAGNOSIS — C50911 Malignant neoplasm of unspecified site of right female breast: Secondary | ICD-10-CM | POA: Diagnosis not present

## 2018-07-21 LAB — GLUCOSE, CAPILLARY
GLUCOSE-CAPILLARY: 198 mg/dL — AB (ref 70–99)
Glucose-Capillary: 216 mg/dL — ABNORMAL HIGH (ref 70–99)
Glucose-Capillary: 223 mg/dL — ABNORMAL HIGH (ref 70–99)
Glucose-Capillary: 187 mg/dL — ABNORMAL HIGH (ref 70–99)

## 2018-07-21 MED ORDER — METHOCARBAMOL 500 MG PO TABS
750.0000 mg | ORAL_TABLET | Freq: Four times a day (QID) | ORAL | Status: DC | PRN
  Administered 2018-07-21 – 2018-07-22 (×3): 750 mg via ORAL
  Filled 2018-07-21 (×3): qty 2

## 2018-07-21 NOTE — Progress Notes (Signed)
Inpatient Diabetes Program Recommendations  AACE/ADA: New Consensus Statement on Inpatient Glycemic Control (2015)  Target Ranges:  Prepandial:   less than 140 mg/dL      Peak postprandial:   less than 180 mg/dL (1-2 hours)      Critically ill patients:  140 - 180 mg/dL   Lab Results  Component Value Date   GLUCAP 223 (H) 07/21/2018   HGBA1C 8.9 (H) 07/13/2018    Review of Glycemic Control  Diabetes history: type 2? Outpatient Diabetes medications: Toujeo 30 units daily, 70/30 insulin 2-5 units SSI TID, Metformin Current orders for Inpatient glycemic control: Novolog SENSITIVE correction scale TID, Metformin  Inpatient Diabetes Program Recommendations:   Noted that blood sugars continue to be greater than 200 mg/dl. Recommend adding low dose basal insulin Lantus 15 units daily along with Novolog SENSITIVE correction scale TID & HS and Metformin if blood sugars continue to be elevated.  Will continue to monitor blood sugars while in the hospital.  Harvel Ricks RN BSN CDE Diabetes Coordinator Pager: [phone]  8am-5pm

## 2018-07-21 NOTE — Progress Notes (Signed)
1 Day Post-Op   Subjective/Chief Complaint: No complaints except for some occasional shooting pains   Objective: Vital signs in last 24 hours: Temp:  [97.5 F (36.4 C)-98.3 F (36.8 C)] 98.3 F (36.8 C) (10/18 0623) Pulse Rate:  [68-95] 79 (10/17 2023) Resp:  [11-36] 20 (10/18 0623) BP: (119-165)/(57-144) 126/59 (10/18 0510) SpO2:  [90 %-100 %] 98 % (10/18 0623) FiO2 (%):  [45 %] 45 % (10/17 1444) Last BM Date: 07/20/18  Intake/Output from previous day: 10/17 0701 - 10/18 0700 In: 1698 [P.O.:650; I.V.:1048] Out: 1189 [Urine:650; Drains:389; Blood:150] Intake/Output this shift: No intake/output data recorded.  General appearance: alert and cooperative Resp: clear to auscultation bilaterally Chest wall: skin flaps look viable Cardio: regular rate and rhythm GI: soft, non-tender; bowel sounds normal; no masses,  no organomegaly  Lab Results:  Recent Labs    07/20/18 1429 07/20/18 1444  HGB 11.9* 10.9*  HCT 35.0* 32.0*   BMET Recent Labs    07/20/18 1442 07/20/18 1444  NA 137 139  K 4.5 4.5  CL 108  --   CO2 19*  --   GLUCOSE 275*  --   BUN 12  --   CREATININE 0.77  --   CALCIUM 9.2  --    PT/INR No results for input(s): LABPROT, INR in the last 72 hours. ABG Recent Labs    07/20/18 1429 07/20/18 1444  PHART 7.360 7.381  HCO3 24.3 22.6    Studies/Results: No results found.  Anti-infectives: Anti-infectives (From admission, onward)   Start     Dose/Rate Route Frequency Ordered Stop   07/20/18 0630  ceFAZolin (ANCEF) IVPB 2g/100 mL premix     2 g 200 mL/hr over 30 Minutes Intravenous On call to O.R. 07/20/18 0626 07/20/18 0816      Assessment/Plan: s/p Procedure(s): BILATERAL MODIFIED RADICAL MASTECTOMIES (Bilateral) Advance diet  Transfer to floor Teach pt and family drain care Plan for discharge tomorrow  LOS: 0 days    Autumn Messing III 07/21/2018

## 2018-07-22 ENCOUNTER — Other Ambulatory Visit: Payer: Self-pay

## 2018-07-22 DIAGNOSIS — C50911 Malignant neoplasm of unspecified site of right female breast: Secondary | ICD-10-CM | POA: Diagnosis not present

## 2018-07-22 DIAGNOSIS — I110 Hypertensive heart disease with heart failure: Secondary | ICD-10-CM | POA: Diagnosis not present

## 2018-07-22 DIAGNOSIS — I5032 Chronic diastolic (congestive) heart failure: Secondary | ICD-10-CM | POA: Diagnosis not present

## 2018-07-22 DIAGNOSIS — C773 Secondary and unspecified malignant neoplasm of axilla and upper limb lymph nodes: Secondary | ICD-10-CM | POA: Diagnosis not present

## 2018-07-22 DIAGNOSIS — C50112 Malignant neoplasm of central portion of left female breast: Secondary | ICD-10-CM | POA: Diagnosis not present

## 2018-07-22 DIAGNOSIS — Z171 Estrogen receptor negative status [ER-]: Secondary | ICD-10-CM | POA: Diagnosis not present

## 2018-07-22 LAB — GLUCOSE, CAPILLARY: GLUCOSE-CAPILLARY: 167 mg/dL — AB (ref 70–99)

## 2018-07-22 MED ORDER — METHOCARBAMOL 750 MG PO TABS: 750.0000 mg | ORAL_TABLET | Freq: Four times a day (QID) | ORAL | 1 refills | Status: DC | PRN

## 2018-07-22 MED ORDER — TRAMADOL HCL 50 MG PO TABS: 50.0000 mg | ORAL_TABLET | Freq: Four times a day (QID) | ORAL | 1 refills | Status: DC | PRN

## 2018-07-22 NOTE — Progress Notes (Signed)
2 Days Post-Op   Subjective/Chief Complaint: Complains of soreness but otherwise ok   Objective: Vital signs in last 24 hours: Temp:  [98 F (36.7 C)-98.7 F (37.1 C)] 98 F (36.7 C) (10/19 0500) Pulse Rate:  [70-74] 74 (10/19 0500) Resp:  [17] 17 (10/18 2000) BP: (113-159)/(50-96) 120/50 (10/19 0500) SpO2:  [100 %] 100 % (10/19 0500) Last BM Date: 07/21/18  Intake/Output from previous day: 10/18 0701 - 10/19 0700 In: -  Out: 490 [Drains:490] Intake/Output this shift: No intake/output data recorded.  General appearance: alert and cooperative Resp: clear to auscultation bilaterally Chest wall: skin flaps look viable Cardio: regular rate and rhythm GI: soft, non-tender; bowel sounds normal; no masses,  no organomegaly  Lab Results:  Recent Labs    07/20/18 1429 07/20/18 1444  HGB 11.9* 10.9*  HCT 35.0* 32.0*   BMET Recent Labs    07/20/18 1442 07/20/18 1444  NA 137 139  K 4.5 4.5  CL 108  --   CO2 19*  --   GLUCOSE 275*  --   BUN 12  --   CREATININE 0.77  --   CALCIUM 9.2  --    PT/INR No results for input(s): LABPROT, INR in the last 72 hours. ABG Recent Labs    07/20/18 1429 07/20/18 1444  PHART 7.360 7.381  HCO3 24.3 22.6    Studies/Results: No results found.  Anti-infectives: Anti-infectives (From admission, onward)   Start     Dose/Rate Route Frequency Ordered Stop   07/20/18 0630  ceFAZolin (ANCEF) IVPB 2g/100 mL premix     2 g 200 mL/hr over 30 Minutes Intravenous On call to O.R. 07/20/18 0626 07/20/18 0816      Assessment/Plan: s/p Procedure(s): BILATERAL MODIFIED RADICAL MASTECTOMIES (Bilateral) Advance diet Discharge  LOS: 0 days    Autumn Messing III 07/22/2018

## 2018-07-22 NOTE — Progress Notes (Signed)
Patient in a stable condition, discharge education reviewed with patient and family at bedside, they verbalized understanding, iv removed, wound care provided as ordered, tele dc ccmd notified,patient belongings at bedside, precriptions given to patient, patient to be transported home by her daughter.

## 2018-07-22 NOTE — Progress Notes (Signed)
Pt given demonstration on how to change JP drains. Pt successfully did a return demonstration on emptying and measuring output. Pt stated she feels comfortable.

## 2018-07-22 NOTE — Discharge Summary (Signed)
Physician Discharge Summary  Patient ID: [name] MRN: [id number] DOB/AGE: 1951/10/01 67 y.o.  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 Postop she had a short period of unresponsiveness in pacu. This resolved and she feels fine now with no deficits. She is now ready for discharge home  Consults: neurology  Significant Diagnostic Studies: none  Treatments: surgery: as above  Discharge Exam: Blood pressure (!) 120/50, pulse 74, temperature 98 F (36.7 C), temperature source Oral, resp. rate 17, height 5\' 1"  (1.549 m), weight 103.9 kg, last menstrual period 07/12/1984, SpO2 100 %. General appearance: alert and cooperative Resp: clear to auscultation bilaterally Chest wall: skin flaps viable Cardio: regular rate and rhythm GI: soft, non-tender; bowel sounds normal; no masses,  no organomegaly  Disposition: Discharge disposition: 01-Home or Self Care       Discharge Instructions    Call MD for:  difficulty breathing, headache or visual disturbances   Complete by:  As directed    Call MD for:  extreme fatigue   Complete by:  As directed    Call MD for:  hives   Complete by:  As directed    Call MD for:  persistant dizziness or light-headedness   Complete by:  As directed    Call MD for:  persistant nausea and vomiting   Complete by:  As directed    Call MD for:  redness, tenderness, or signs of infection (pain, swelling, redness, odor or green/yellow discharge around incision site)   Complete by:  As directed    Call MD for:  severe uncontrolled pain   Complete by:  As directed    Call MD for:  temperature >100.4   Complete by:  As directed    Diet - low sodium heart healthy   Complete by:  As directed    Discharge instructions   Complete by:  As  directed    Sponge bathe while drains are in. No overhead activity. Empty drains, record output, recharge bulb twice a day. Wear binder most of the time   Increase activity slowly   Complete by:  As directed    No wound care   Complete by:  As directed      Allergies as of 07/22/2018      Reactions   Lisinopril Other (See Comments)   Chronic cough secondary to ACE   Oxycodone-acetaminophen Hives, Itching, Other (See Comments)   flushing   Percocet [oxycodone-acetaminophen] Hives   Victoza [liraglutide] Other (See Comments)   Made her feel "out of it"   Atorvastatin Other (See Comments)   myalgias      Medication List    TAKE these medications   ACCU-CHEK FASTCLIX LANCETS Misc Check blood sugar 3 times a day   ACCU-CHEK GUIDE test strip Generic drug:  glucose blood USE 1  THREE TIMES DAILY   amLODipine 10 MG tablet Commonly known as:  NORVASC TAKE ONE (1) TABLET BY MOUTH EVERY DAY What changed:  See the new instructions.   aspirin EC 81 MG tablet Take 81 mg by mouth daily.   chlorpheniramine-HYDROcodone 10-8 MG/5ML Suer Commonly known as:  TUSSIONEX Take 5 mLs by mouth 2 (two) times daily.   Co Q 10 100 MG Caps Take 100 mg by mouth daily.   dexamethasone 4 MG tablet Commonly known  as:  DECADRON Take 1 tablet (4 mg total) by mouth daily. Take 1 tablet day before chemo and 1 tablet day after chemo   ezetimibe 10 MG tablet Commonly known as:  ZETIA TAKE ONE (1) TABLET BY MOUTH EVERY DAY   furosemide 20 MG tablet Commonly known as:  LASIX TAKE 1 TABLET (20 MG TOTAL) BY MOUTH AS NEEDED FOR EDEMA. What changed:  when to take this   gabapentin 300 MG capsule Commonly known as:  NEURONTIN Take 1 capsule (300 mg total) by mouth at bedtime.   insulin aspart 100 UNIT/ML FlexPen Commonly known as:  NOVOLOG If 150-199 inject 2 units,200-250 inject 3 units,251- 350 inject 4 units,351-400 inject 5 units, call office.   insulin aspart protamine- aspart (70-30) 100  UNIT/ML injection Commonly known as:  NOVOLOG MIX 70/30 Inject 2-5 Units into the skin See admin instructions. Inject per sliding scale 201-250 = 2 units, 251-300 = 3 units, 301-400 = 4 units, >401 = 5 units and call MD SQ 3 times daily.   Insulin Glargine 300 UNIT/ML Sopn Inject 30 Units into the skin daily.   Insulin Pen Needle 32G X 4 MM Misc USE FOR INJECTIONS up to 4 times daily. Diagnosis code E11.40, Z79.4   isosorbide mononitrate 120 MG 24 hr tablet Commonly known as:  IMDUR Take 2 tablets (240 mg total) by mouth every morning. What changed:    how much to take  when to take this   lidocaine-prilocaine cream Commonly known as:  EMLA Apply to affected area once What changed:    how much to take  how to take this  when to take this  additional instructions   loratadine 10 MG tablet Commonly known as:  CLARITIN Take 10 mg by mouth daily as needed for allergies.   losartan 50 MG tablet Commonly known as:  COZAAR TAKE ONE (1) TABLET BY MOUTH EVERY DAY What changed:  See the new instructions.   meloxicam 7.5 MG tablet Commonly known as:  MOBIC TAKE 1 TABLET BY MOUTH ONCE DAILY What changed:    when to take this  reasons to take this   metFORMIN 1000 MG tablet Commonly known as:  GLUCOPHAGE TAKE ONE (1) TABLET BY MOUTH TWO (2) TIMES DAILY WITH A MEAL What changed:  See the new instructions.   methocarbamol 750 MG tablet Commonly known as:  ROBAXIN Take 1 tablet (750 mg total) by mouth every 6 (six) hours as needed for muscle spasms.   metoprolol succinate 100 MG 24 hr tablet Commonly known as:  TOPROL-XL TAKE ONE (1) TABLET BY MOUTH EVERY DAY What changed:    how much to take  how to take this  when to take this  additional instructions   nitroGLYCERIN 0.4 mg/hr patch Commonly known as:  NITRODUR - Dosed in mg/24 hr APPLY ONE PATCH ON SKIN EVERY DAY. LEAVE ON FOR 12 HOURS AND OFF FOR 12 HOURS. What changed:  Another medication with the same  name was changed. Make sure you understand how and when to take each.   nitroGLYCERIN 0.4 MG SL tablet Commonly known as:  NITROSTAT PLACE 1 TABLET UNDER THE TONGUE EVERY 5 MINUTES AS NEEDED FOR CHEST PAIN What changed:  See the new instructions.   omeprazole 40 MG capsule Commonly known as:  PRILOSEC Take 1 capsule (40 mg total) by mouth daily. What changed:    when to take this  reasons to take this   ondansetron 8 MG tablet Commonly known as:  ZOFRAN Take 1 tablet (8 mg total) by mouth 2 (two) times daily as needed for refractory nausea / vomiting. Start on day 3 after chemo.   PATADAY 0.2 % Soln Generic drug:  Olopatadine HCl Place 1 drop into both eyes daily as needed (for allergies).   prochlorperazine 10 MG tablet Commonly known as:  COMPAZINE Take 1 tablet (10 mg total) by mouth every 6 (six) hours as needed (Nausea or vomiting). What changed:  reasons to take this   spironolactone 25 MG tablet Commonly known as:  ALDACTONE Take 1 tablet (25 mg total) by mouth daily.   traMADol 50 MG tablet Commonly known as:  ULTRAM Take 1 tablet (50 mg total) by mouth every 12 (twelve) hours as needed. What changed:  reasons to take this   traMADol 50 MG tablet Commonly known as:  ULTRAM Take 1-2 tablets (50-100 mg total) by mouth every 6 (six) hours as needed (pain not controlled with tylenol). What changed:  You were already taking a medication with the same name, and this prescription was added. Make sure you understand how and when to take each.   vitamin B-12 1000 MCG tablet Commonly known as:  CYANOCOBALAMIN Take 1,000 mcg by mouth daily.      Follow-up Information    Autumn Messing III, MD Follow up in 2 week(s).   Specialty:  General Surgery Contact information: [place] [address] [phone]           Signed: Autumn Messing III 07/22/2018, 8:00 AM

## 2018-07-22 NOTE — Progress Notes (Signed)
Pt ambulated in hall about 230 ft on room air. Use of front wheel walker standby assist. Vital signs stable.  Pt did report she felt tired but otherwise she felt fine. Overall pt tolerated well.

## 2018-07-25 ENCOUNTER — Telehealth: Payer: Self-pay | Admitting: *Deleted

## 2018-07-25 NOTE — Telephone Encounter (Signed)
Nancy at [employer] called for clarification of insulins, read over scripts with her and she repeated back

## 2018-07-27 ENCOUNTER — Inpatient Hospital Stay: Payer: Medicare Other | Attending: Hematology and Oncology | Admitting: Hematology and Oncology

## 2018-07-27 ENCOUNTER — Telehealth: Payer: Self-pay | Admitting: Hematology and Oncology

## 2018-07-27 DIAGNOSIS — Z170 Estrogen receptor positive status [ER+]: Secondary | ICD-10-CM

## 2018-07-27 DIAGNOSIS — C50812 Malignant neoplasm of overlapping sites of left female breast: Secondary | ICD-10-CM | POA: Diagnosis not present

## 2018-07-27 DIAGNOSIS — C50312 Malignant neoplasm of lower-inner quadrant of left female breast: Secondary | ICD-10-CM

## 2018-07-27 MED ORDER — CAPECITABINE 500 MG PO TABS: 1000.0000 mg | ORAL_TABLET | Freq: Two times a day (BID) | ORAL | 0 refills | Status: DC

## 2018-07-27 NOTE — Assessment & Plan Note (Signed)
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 S/p lumpectomy and radiation.  03/21/2018:[place] skin anterior left breast: Ultrasound 7 o'clock position 2 cm from nipple irregular hypoechoic mass 2.2 x 2.5 x 2.5 cm (1 cm medial to prior lumpectomy); 12:00: Hypoechoic mass extending to skin 3.9 x 2.9 x 3.6 cm market skin thickening, no axillary lymph nodes  Left breast 12 o'clock position: IDC grade 3; 7 o'clock position: IDC grade 3, ER 40%, PR 0%, Ki-67 80%, HER-2 equivocal by FISH ratio 1.17 copy #4.4, IHC 1+ negative, T4d N0 stage IIIc clinical stage  Right breast and axillary ultrasound: Prominent right axillary lymph node.Biopsy revealed metastatic poorly differentiated carcinoma ER 0%, PR 0%, Ki-67 90%  CT CAP 04/20/18:Breast masses and axillary lymph node, no metastatic disease elsewhere ----------------------------------------------------------------------------- Treatment plan: 1. Neoadjuvant chemotherapy withTaxotere Cytoxan x4cycles completed 06/20/2018 (due to cardiac issues she was not a candidate for Adriamycin) 2. followed by mastectomy 3. Followed by radiation 4. Followed by antiestrogen therapy ----------------------------------------------------------------------------  07/20/2018: Bilateral mastectomies:  Left mastectomy: Grade 3 IDC 6.5 cm, 8/14 lymph nodes positive, ER 40%, PR 0%, HER-2 negative, Ki-67 80%; right axillary lymph node ER 0%, PR 0%, HER-2 negative, Ki-67 90% RCB class III;  Right mastectomy: Benign  Pathology counseling: I discussed the final pathology report of the patient provided  a copy of this report. I discussed the margins as well as lymph node surgeries. We also discussed the final staging along with previously performed ER/PR and HER-2/neu testing.  Unfortunately patient is at high risk of recurrence based on lack of adequate response to chemotherapy.  Recommendation: 1.  Adjuvant Xeloda with  radiation 2. adjuvant antiestrogen therapy after completion of radiation  Return to clinic for blood work during radiation therapy.

## 2018-07-27 NOTE — Telephone Encounter (Signed)
Gave avs and calendar ° °

## 2018-07-27 NOTE — Progress Notes (Signed)
Patient Care Team: Bartholomew Crews, MD as PCP - General (Internal Medicine) Hayden Pedro, MD as Consulting Physician (Ophthalmology) Shirley Muscat Loreen Freud, MD as Referring Physician (Optometry) Minus Breeding, MD as Consulting Physician (Cardiology)  DIAGNOSIS:  Encounter Diagnosis  Name Primary?  . 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 S/p lumpectomy and radiation.    03/21/2018 Initial Diagnosis    [place] skin anterior left breast: Ultrasound 7 o'clock position 2 cm from nipple irregular hypoechoic mass 2.2 x 2.5 x 2.5 cm (1 cm medial to prior lumpectomy); 12:00: Hypoechoic mass extending to skin 3.9 x 2.9 x 3.6 cm market skin thickening, no axillary lymph nodes    03/21/2018 Initial Biopsy    Left breast 12 o'clock position: IDC grade 3; 7 o'clock position: IDC grade 3, ER 40%, PR 0%, Ki-67 80%, HER-2 equivocal by FISH ratio 1.17 copy #4.4, IHC 1+ negative, T4d N0 stage IIIc clinical stage     04/20/2018 Imaging    IMPRESSION: 1. Multiple masses within the left breast (inflammatory breast carcinoma.) 2. Cortically thickened right axillary lymph node, indeterminate. Metastatic disease not excluded (patient refused biopsy)    05/09/2018 - 06/20/2018 Neo-Adjuvant Chemotherapy    Neoadjuvant chemotherapy with TC X 4    07/20/2018 Surgery    Bilateral mastectomies: Left mastectomy: Grade 3 IDC 6.5 cm, 1/2 lymph nodes positive, ER 40%, PR 0%, HER-2 negative, Ki-67 80%; right axillary lymph node 7/12 LN pos ER 0%, PR 0%, HER-2 negative, Ki-67 90% RCB class III; right mastectomy: Benign     CHIEF COMPLIANT: Follow-up after bilateral mastectomies after neoadjuvant chemo  INTERVAL HISTORY:  [name] is a 67 year old with above-mentioned history of 2006 left breast cancer treated with lumpectomy and radiation who presented with inflammatory changes in the left breast as well as enlarged right axillary lymph nodes.  She underwent neoadjuvant chemotherapy with Taxotere Cytoxan and then underwent bilateral mastectomies.  She is here today to discuss pathology report.  She was doing quite well up until today when she stopped the pain medication she is experiencing a lot of pain and discomfort.  She was found to have multiple enlarged lymph nodes in the right axilla  REVIEW OF SYSTEMS:   Constitutional: Denies fevers, chills or abnormal weight loss Eyes: Denies blurriness of vision Ears, nose, mouth, throat, and face: Denies mucositis or sore throat Respiratory: Denies cough, dyspnea or wheezes Cardiovascular: Denies palpitation, chest discomfort Gastrointestinal:  Denies nausea, heartburn or change in bowel habits Skin: Denies abnormal skin rashes Lymphatics: Denies new lymphadenopathy or easy bruising Neurological:Denies numbness, tingling or new weaknesses Behavioral/Psych: Mood is stable, no new changes  Extremities: No lower extremity edema   All other systems were reviewed with the patient and are negative.  I have reviewed the past medical history, past surgical history, social history and family history with the patient and they are unchanged from previous note.  ALLERGIES:  is allergic to lisinopril; oxycodone-acetaminophen; percocet [oxycodone-acetaminophen]; victoza [liraglutide]; and atorvastatin.  MEDICATIONS:  Current Outpatient Medications  Medication Sig Dispense Refill  . ACCU-CHEK FASTCLIX LANCETS MISC Check blood sugar 3 times a day (Patient not taking: Reported on 07/07/2018) 102 each 5  . ACCU-CHEK GUIDE test strip USE  1  THREE TIMES DAILY (Patient not taking: Reported on 07/07/2018) 375 each 1  . amLODipine (NORVASC) 10 MG tablet TAKE ONE (1) TABLET BY  MOUTH EVERY DAY (Patient taking differently: Take 10 mg by mouth daily. ) 90 tablet 3  . aspirin EC 81 MG tablet Take 81 mg by mouth daily.    . chlorpheniramine-HYDROcodone (TUSSIONEX PENNKINETIC ER) 10-8 MG/5ML SUER Take 5 mLs by mouth 2 (two) times daily. (Patient not taking: Reported on 07/07/2018) 140 mL 0  . Coenzyme Q10 (CO Q 10) 100 MG CAPS Take 100 mg by mouth daily.     Marland Kitchen dexamethasone (DECADRON) 4 MG tablet Take 1 tablet (4 mg total) by mouth daily. Take 1 tablet day before chemo and 1 tablet day after chemo (Patient not taking: Reported on 07/07/2018) 10 tablet 0  . ezetimibe (ZETIA) 10 MG tablet TAKE ONE (1) TABLET BY MOUTH EVERY DAY (Patient not taking: No sig reported) 30 tablet 7  . furosemide (LASIX) 20 MG tablet TAKE 1 TABLET (20 MG TOTAL) BY MOUTH AS NEEDED FOR EDEMA. (Patient taking differently: Take 20 mg by mouth daily as needed for edema. ) 30 tablet 2  . gabapentin (NEURONTIN) 300 MG capsule Take 1 capsule (300 mg total) by mouth at bedtime. 30 capsule 2  . insulin aspart (NOVOLOG FLEXPEN) 100 UNIT/ML FlexPen If 150-199 inject 2 units,200-250 inject 3 units,251- 350 inject 4 units,351-400 inject 5 units, call office. (Patient not taking: Reported on 07/07/2018) 15 mL 1  . insulin aspart protamine- aspart (NOVOLOG MIX 70/30) (70-30) 100 UNIT/ML injection Inject 2-5 Units into the skin See admin instructions. Inject per sliding scale 201-250 = 2 units, 251-300 = 3 units, 301-400 = 4 units, >401 = 5 units and call MD SQ 3 times daily.    . Insulin Glargine (TOUJEO SOLOSTAR) 300 UNIT/ML SOPN Inject 30 Units into the skin daily. 12 mL 3  . Insulin Pen Needle 32G X 4 MM MISC USE FOR INJECTIONS up to 4 times daily. Diagnosis code E11.40, Z79.4 (Patient not taking: Reported on 07/07/2018) 200 each 5  . isosorbide mononitrate (IMDUR) 120 MG 24 hr tablet Take 2 tablets (240 mg total) by mouth every morning. (Patient taking differently: Take 120 mg by mouth 2 (two) times daily. ) 30 tablet 11    . lidocaine-prilocaine (EMLA) cream Apply to affected area once (Patient taking differently: Apply 1 application topically once. ) 30 g 3  . loratadine (CLARITIN) 10 MG tablet Take 10 mg by mouth daily as needed for allergies.    Marland Kitchen losartan (COZAAR) 50 MG tablet TAKE ONE (1) TABLET BY MOUTH EVERY DAY (Patient taking differently: Take 50 mg by mouth daily. ) 90 tablet 3  . meloxicam (MOBIC) 7.5 MG tablet TAKE 1 TABLET BY MOUTH ONCE DAILY (Patient taking differently: Take 7.5 mg by mouth daily as needed for pain. ) 30 tablet 2  . metFORMIN (GLUCOPHAGE) 1000 MG tablet TAKE ONE (1) TABLET BY MOUTH TWO (2) TIMES DAILY WITH A MEAL (Patient taking differently: Take 1,000 mg by mouth 2 (two) times daily with a meal. ) 180 tablet 3  . methocarbamol (ROBAXIN) 750 MG tablet Take 1 tablet (750 mg total) by mouth every 6 (six) hours as needed for muscle spasms. 30 tablet 1  . metoprolol succinate (TOPROL-XL) 100 MG 24 hr tablet TAKE ONE (1) TABLET BY MOUTH EVERY DAY (Patient taking differently: Take 100 mg by mouth daily. ) 90 tablet 3  . nitroGLYCERIN (NITRODUR - DOSED IN MG/24  HR) 0.4 mg/hr patch APPLY ONE PATCH ON SKIN EVERY DAY. LEAVE ON FOR 12 HOURS AND OFF FOR 12 HOURS. (Patient not taking: No sig reported) 100 patch 3  . nitroGLYCERIN (NITROSTAT) 0.4 MG SL tablet PLACE 1 TABLET UNDER THE TONGUE EVERY 5 MINUTES AS NEEDED FOR CHEST PAIN (Patient taking differently: Place 0.4 mg under the tongue every 5 (five) minutes as needed for chest pain. ) 25 tablet 7  . Olopatadine HCl (PATADAY) 0.2 % SOLN Place 1 drop into both eyes daily as needed (for allergies).     Marland Kitchen omeprazole (PRILOSEC) 40 MG capsule Take 1 capsule (40 mg total) by mouth daily. (Patient taking differently: Take 40 mg by mouth daily as needed (for acid reflux). ) 30 capsule 5  . ondansetron (ZOFRAN) 8 MG tablet Take 1 tablet (8 mg total) by mouth 2 (two) times daily as needed for refractory nausea / vomiting. Start on day 3 after chemo. (Patient not  taking: Reported on 07/07/2018) 30 tablet 1  . prochlorperazine (COMPAZINE) 10 MG tablet Take 1 tablet (10 mg total) by mouth every 6 (six) hours as needed (Nausea or vomiting). (Patient taking differently: Take 10 mg by mouth every 6 (six) hours as needed for nausea or vomiting. ) 60 tablet 2  . spironolactone (ALDACTONE) 25 MG tablet Take 1 tablet (25 mg total) by mouth daily. 90 tablet 3  . traMADol (ULTRAM) 50 MG tablet Take 1 tablet (50 mg total) by mouth every 12 (twelve) hours as needed. (Patient taking differently: Take 50 mg by mouth every 12 (twelve) hours as needed for moderate pain. ) 30 tablet 0  . traMADol (ULTRAM) 50 MG tablet Take 1-2 tablets (50-100 mg total) by mouth every 6 (six) hours as needed (pain not controlled with tylenol). 30 tablet 1  . vitamin B-12 (CYANOCOBALAMIN) 1000 MCG tablet Take 1,000 mcg by mouth daily.     No current facility-administered medications for this visit.     PHYSICAL EXAMINATION: ECOG PERFORMANCE STATUS: 2 - Symptomatic, <50% confined to bed  Vitals:   07/27/18 1209  BP: (!) 160/74  Pulse: 90  Resp: 16  Temp: 98.6 F (37 C)  SpO2: 100%   Filed Weights   07/27/18 1209  Weight: 217 lb 1.6 oz (98.5 kg)    GENERAL:alert, no distress and comfortable SKIN: skin color, texture, turgor are normal, no rashes or significant lesions EYES: normal, Conjunctiva are pink and non-injected, sclera clear OROPHARYNX:no exudate, no erythema and lips, buccal mucosa, and tongue normal  NECK: supple, thyroid normal size, non-tender, without nodularity LYMPH:  no palpable lymphadenopathy in the cervical, axillary or inguinal LUNGS: clear to auscultation and percussion with normal breathing effort HEART: regular rate & rhythm and no murmurs and no lower extremity edema ABDOMEN:abdomen soft, non-tender and normal bowel sounds MUSCULOSKELETAL:no cyanosis of digits and no clubbing  NEURO: alert & oriented x 3 with fluent speech, no focal motor/sensory  deficits EXTREMITIES: No lower extremity edema   LABORATORY DATA:  I have reviewed the data as listed CMP Latest Ref Rng & Units 07/20/2018 07/20/2018 07/20/2018  Glucose 70 - 99 mg/dL - 275(H) -  BUN 8 - 23 mg/dL - 12 -  Creatinine 0.44 - 1.00 mg/dL - 0.77 -  Sodium 135 - 145 mmol/L 139 137 139  Potassium 3.5 - 5.1 mmol/L 4.5 4.5 4.5  Chloride 98 - 111 mmol/L - 108 -  CO2 22 - 32 mmol/L - 19(L) -  Calcium 8.9 - 10.3 mg/dL - 9.2 -  Total Protein 6.5 - 8.1 g/dL - 6.9 -  Total Bilirubin 0.3 - 1.2 mg/dL - 0.3 -  Alkaline Phos 38 - 126 U/L - 56 -  AST 15 - 41 U/L - 26 -  ALT 0 - 44 U/L - 19 -    Lab Results  Component Value Date   WBC 5.7 07/13/2018   HGB 10.9 (L) 07/20/2018   HCT 32.0 (L) 07/20/2018   MCV 96.9 07/13/2018   PLT 262 07/13/2018   NEUTROABS 3.6 06/20/2018    ASSESSMENT & PLAN:  Malignant neoplasm of lower-inner quadrant of left breast in female, estrogen receptor positive [facility]) 2006 June : 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 S/p lumpectomy and radiation.  03/21/2018:[place] skin anterior left breast: Ultrasound 7 o'clock position 2 cm from nipple irregular hypoechoic mass 2.2 x 2.5 x 2.5 cm (1 cm medial to prior lumpectomy); 12:00: Hypoechoic mass extending to skin 3.9 x 2.9 x 3.6 cm market skin thickening, no axillary lymph nodes  Left breast 12 o'clock position: IDC grade 3; 7 o'clock position: IDC grade 3, ER 40%, PR 0%, Ki-67 80%, HER-2 equivocal by FISH ratio 1.17 copy #4.4, IHC 1+ negative, T4d N0 stage IIIc clinical stage  Right breast and axillary ultrasound: Prominent right axillary lymph node.Biopsy revealed metastatic poorly differentiated carcinoma ER 0%, PR 0%, Ki-67 90%  CT CAP 04/20/18:Breast masses and axillary lymph node, no metastatic disease elsewhere ----------------------------------------------------------------------------- Treatment plan: 1. Neoadjuvant chemotherapy withTaxotere Cytoxan  x4cycles completed 06/20/2018 (due to cardiac issues she was not a candidate for Adriamycin) 2. followed by mastectomy 3. Followed by radiation 4. Followed by antiestrogen therapy ----------------------------------------------------------------------------  07/20/2018: Bilateral mastectomies:  Left mastectomy: Grade 3 IDC 6.5 cm, 1/2 lymph nodes positive, ER 40%, PR 0%, HER-2 negative, Ki-67 80%;  right axillary lymph node 7/12 Positive ER 0%, PR 0%, HER-2 negative, Ki-67 90% RCB class III;  Right mastectomy: Benign  Pathology counseling: I discussed the final pathology report of the patient provided  a copy of this report. I discussed the margins as well as lymph node surgeries. We also discussed the final staging along with previously performed ER/PR and HER-2/neu testing.  Unfortunately patient is at high risk of recurrence based on lack of adequate response to chemotherapy.  Recommendation: 1.  Adjuvant Xeloda with radiation 2. adjuvant antiestrogen therapy after completion of radiation  CREATE-X study enrolled 910 patient's with triple negative breast cancer and residual disease after neo-adjuvant therapy. 455 patients assigned to 1250 mg/m times daily 2 weeks on 1 week off  up to 8 cycles, at 5 years PFS 74.1% with Xeloda compared to 67.7% in control arm, 30% reduction in risk, overall survival 89.2% versus 83.9%, hand-foot syndrome was seen in 72.3% with Xeloda grade 3 in 10.9%   Return to clinic for blood work during radiation therapy.      No orders of the defined types were placed in this encounter.  The patient has a good understanding of the overall plan. she agrees with it. she will call with any problems that may develop before the next visit here.   Harriette Ohara, MD 07/27/18

## 2018-07-28 ENCOUNTER — Telehealth: Payer: Self-pay

## 2018-07-28 ENCOUNTER — Telehealth: Payer: Self-pay | Admitting: Pharmacist

## 2018-07-28 ENCOUNTER — Other Ambulatory Visit: Payer: Self-pay | Admitting: *Deleted

## 2018-07-28 DIAGNOSIS — Z170 Estrogen receptor positive status [ER+]: Principal | ICD-10-CM

## 2018-07-28 DIAGNOSIS — C50312 Malignant neoplasm of lower-inner quadrant of left female breast: Secondary | ICD-10-CM

## 2018-07-28 NOTE — Telephone Encounter (Signed)
Oral Oncology Patient Advocate Encounter  Received notification from [facility] that prior authorization for Xeloda is required.  PA submitted on CoverMyMeds Key [id number] Status is pending  Oral Oncology Clinic will continue to follow.   [place] Patient [place] Phone [phone] Fax [phone]

## 2018-07-28 NOTE — Telephone Encounter (Signed)
Oral Oncology Pharmacist Encounter  Received new prescription for Xeloda (capecitabine) for the adjuvant treatment of stage IIIc breast cancer in conjunction with radiation, planned duration 6 1/2 weeks of therapy.  Labs from Epic assessed, [place] for treatment.  Prescription has been written at low dose to be given concurrently with radiation treatment. We will clarify dosing scheme with MD prior to Xeloda initiation. Start date of radiation is not yet known.  Current medication list in Epic reviewed, no significant DDIs with Xeloda identified:  Category C interaction between Xeloda and omeprazole: Concurrent use of proton pump inhibitor was associated with poor progression free survival and overall survival in a secondary analysis of a retrospective trial performed and gastric cancer patients with the use of Xeloda.  Subsequent multivariate analysis determined that there was not a decrease in important efficacy endpoints with the concurrent use of Xeloda and proton pump inhibitor therapy.  Patient will be screened for discontinuation of PPI.  No change to current therapy is indicated at this time.  Prescription has been e-scribed to the [facility] for benefits analysis and approval.  Oral Oncology Clinic will continue to follow for insurance authorization, copayment issues, initial counseling and start date.  Johny Drilling, PharmD, BCPS, BCOP  07/28/2018 1:40 PM Oral Oncology Clinic [phone]

## 2018-07-31 MED ORDER — CAPECITABINE 500 MG PO TABS: 1000.0000 mg | ORAL_TABLET | Freq: Two times a day (BID) | ORAL | 0 refills | Status: DC

## 2018-07-31 NOTE — Telephone Encounter (Signed)
Oral Oncology Patient Advocate Encounter  Received notification from [facility] that the request for prior authorization for Xeloda has been denied due to needing to bill Part B.    Part B was billed and copay is $88.37. I will work on getting the patient a grant today.  I will update grant information in a separate encounter.  [place] Patient [place] Phone [phone] Fax [phone]

## 2018-07-31 NOTE — Telephone Encounter (Signed)
Oral Oncology Pharmacist Encounter  Clarified prescription with MD. Prescription has been resent to the [facility] outpatient pharmacy for Xeloda 500 mg tablets, take 2 tablets (1000 mg) by mouth 2 times daily after a meal.  Take on days of radiation only, M-F. Prescription sent for quantity #132 for 33 treatment days, 45 calendar days.  We will wait until patient is scheduled for CT simulation appointment prior to reaching out to patient to discuss the load of medication acquisition.  Johny Drilling, PharmD, BCPS, BCOP  07/31/2018 9:55 AM Oral Oncology Clinic [phone];

## 2018-08-02 ENCOUNTER — Ambulatory Visit: Payer: Medicare Other | Attending: General Surgery | Admitting: Physical Therapy

## 2018-08-02 ENCOUNTER — Other Ambulatory Visit: Payer: Self-pay

## 2018-08-02 DIAGNOSIS — M25612 Stiffness of left shoulder, not elsewhere classified: Secondary | ICD-10-CM | POA: Diagnosis not present

## 2018-08-02 DIAGNOSIS — Z483 Aftercare following surgery for neoplasm: Secondary | ICD-10-CM | POA: Insufficient documentation

## 2018-08-02 DIAGNOSIS — M25611 Stiffness of right shoulder, not elsewhere classified: Secondary | ICD-10-CM | POA: Insufficient documentation

## 2018-08-02 DIAGNOSIS — I972 Postmastectomy lymphedema syndrome: Secondary | ICD-10-CM | POA: Diagnosis not present

## 2018-08-02 DIAGNOSIS — M6281 Muscle weakness (generalized): Secondary | ICD-10-CM | POA: Insufficient documentation

## 2018-08-02 NOTE — Therapy (Signed)
[place], [place], [address] Phone: [phone]   Fax:  [phone]  Physical Therapy Evaluation  Patient Details  Name: [name] MRN: [id number] Date of Birth: 1951/07/04 Referring Provider (PT): Dr. Marlou Starks    Encounter Date: 08/02/2018  PT End of Session - 08/02/18 2003    Visit Number  1    Number of Visits  13    Date for PT Re-Evaluation  09/15/18    PT Start Time  1300    PT Stop Time  1345    PT Time Calculation (min)  45 min    Activity Tolerance  Patient tolerated treatment well    Behavior During Therapy  [facility] for tasks assessed/performed       Past Medical History:  Diagnosis Date  . Arthritis    "knees, lower back" (08/18/2017)  . 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 S/p lumpectomy and radiation.  Marland Kitchen CAD (coronary artery disease) 11/07   cath 11/11 :  3V CADsee report   . Cataract    small both eyes  . Chronic lower back pain   . Chronic pain    MR 08/07/10 :  Spondylosis L4-5 with B foraminal narrowing and L4 nerve root enchroachment B.  . Chronic venous insufficiency 2012   Has had full W/U incl ECHO, LFT's, Cr, Umicro, TSH, BNP. Symptomatic treatment.  . Colonic polyp 1995   Colonoscopies 1994, 2000, and 02/02/2011. Last had 9 polyps - Tubular adenoma and tubulovillous was the pathology results without high grade dysplasia  . Diabetes mellitus type II    Insulin dependent. Has worked with [name] R previously.  . Diastolic CHF, chronic ([place])    NL EF by echo 01/2012, Gr I dd  . Essential hypertension    Requires 5 drug therapy and still difficult to control.  Marland Kitchen GERD (gastroesophageal reflux disease)   . Heart murmur   . Hyperlipidemia    On daily statin  . Iron deficiency anemia    Ferritin was 12 in 2012. on FeSo4. Has had colon and EGD 02/02/11 that showed mild gastritis and colon polyps.  . Obesity    Morbid. HAs  worked with [name] T previously.  . OSA (obstructive sleep apnea) 02/16/2007   02/2007 : Moderate AHI 35.6, O2 sat decreased to 65%, CPAP titration to 16 with AHI of 3.6. 02/2014 : Rare respiratory events with sleep disturbance, within normal limits. AHI 0.4 per hour      . OSA on CPAP   . Personal history of chemotherapy   . Personal history of radiation therapy    "to my left breast"  . Refusal of blood transfusions as patient is Jehovah's Witness   . Seasonal allergies    "grass pollen"    Past Surgical History:  Procedure Laterality Date  . BREAST LUMPECTOMY Left 2006  . BREAST LUMPECTOMY W/ NEEDLE LOCALIZATION  2006   34 Chemo RX  . CARDIAC CATHETERIZATION N/A 08/15/2015   Procedure: Left Heart Cath and Coronary Angiography;  Surgeon: Troy Sine, MD;  Location: [place] [facility];  Service: Cardiovascular;  Laterality: N/A;  . COLONOSCOPY W/ BIOPSIES AND POLYPECTOMY  1994, 2000, 2012, 26/018  . IR IMAGING GUIDED PORT INSERTION  04/17/2018  . LEFT HEART CATH AND CORONARY ANGIOGRAPHY N/A 08/19/2017   Procedure: LEFT HEART CATH AND CORONARY ANGIOGRAPHY;  Surgeon: Belva Crome, MD;  Location: [place] [facility];  Service: Cardiovascular;  Laterality: N/A;  . MASTECTOMY MODIFIED RADICAL Bilateral 07/20/2018  . MASTECTOMY MODIFIED RADICAL Bilateral 07/20/2018   Procedure: BILATERAL MODIFIED RADICAL MASTECTOMIES;  Surgeon: Jovita Kussmaul, MD;  Location: [place];  Service: General;  Laterality: Bilateral;  . PORTACATH PLACEMENT N/A 04/14/2018   Procedure: ATTEMPED INSERTION PORT-A-CATH;  Surgeon: Jovita Kussmaul, MD;  Location: [facility];  Service: General;  Laterality: N/A;  . TONSILLECTOMY AND ADENOIDECTOMY  1964  . TOTAL ABDOMINAL HYSTERECTOMY  1985   "I had endometrosis"  . ULTRASOUND GUIDANCE FOR VASCULAR ACCESS  08/19/2017   Procedure: Ultrasound Guidance For Vascular Access;  Surgeon: Belva Crome, MD;  Location: [place] [facility];  Service: Cardiovascular;;    There were no  vitals filed for this visit.   Subjective Assessment - 08/02/18 1309    Subjective  The doctor wanted me to come here to get sleeves for both arms.  She has been having swelling in both arms, moreso in left arm especially in the last week.     Pertinent History   Pt had breast cancer diagnosed in May 2019 with 2 blisters popped up on left breast that got worse in June, She was diagnosed with left inflammatory breast cancer and had right axillay node involvment also.  Pt has bilateral mastectomy on October 17 with 1/2 nodes positive on left and 7/12/ nodes positive on right  She underwent chemotherapy from July - Sept and had some problems with fatigue and tingling in hands and fingertips She was not able to get the full does of chemo due to CHF.  The surgery was a success and she plans to start radiation to the right side on Dec. 10 Past history includes lumpectomy and radiation to left in 2006, CHF. lower back pain that causes her to walk with a cane  Past history also includes diabetes and high blood pressure     Currently in Pain?  No/denies         [facility] PT Assessment - 08/02/18 0001      Assessment   Medical Diagnosis  bilateral breast cancer     Referring Provider (PT)  Dr. Marlou Starks     Onset Date/Surgical Date  07/20/18    Hand Dominance  Right      Precautions   Precautions  Other (comment)    Precaution Comments  lifting precautions       Restrictions   Weight Bearing Restrictions  No      Balance Screen   Has the patient fallen in the past 6 months  No    Has the patient had a decrease in activity level because of a fear of falling?   No    Is the patient reluctant to leave their home because of a fear of falling?   No      Home Environment   Living Environment  Private residence    Living Arrangements  Alone    Available Help at Discharge  Available PRN/intermittently   2 daughters live in town    Type of Home  Apartment      Prior Function   Level of Independence   Independent with homemaking with wheelchair;Needs assistance with ADLs    Vocation  Retired    Leisure  used to walk before cancer treatment, used to go to the gym at her apartment       Cognition   Overall Cognitive Status  Within Functional Limits for tasks assessed  Observation/Other Assessments   Observations  Pt walks in with a straight cane.  She has dressings over incisions on chest so they were not fully inspected.  Pt is protective of incisionon on left chest per instruction of Dr. Marlou Starks.  She also has 2 drains intact and draining from each side. Visible fullness in both upper arms and especailly at right posterior axilla and back       Sensation   Light Touch  Not tested      Coordination   Gross Motor Movements are Fluid and Coordinated  No   guarded by pain      Posture/Postural Control   Posture/Postural Control  Postural limitations    Postural Limitations  Rounded Shoulders;Forward head;Increased lumbar lordosis    Posture Comments  pt obese      ROM / Strength   AROM / PROM / Strength  AROM      AROM   Overall AROM Comments  very limited in both shoulders by pain and limitation to < 90 degrees since drains are still intact. Pt is able to move right shoulder easier than left as she reports more pain in left and is more protective since that was the side of the inflammatory breast cancer        LYMPHEDEMA/ONCOLOGY QUESTIONNAIRE - 08/02/18 1337      Right Upper Extremity Lymphedema   15 cm Proximal to Olecranon Process  45 cm    10 cm Proximal to Olecranon Process  42 cm    Olecranon Process  28 cm    15 cm Proximal to Ulnar Styloid Process  29 cm    Just Proximal to Ulnar Styloid Process  17 cm    Across Hand at [employer]  19.5 cm    At [place] of 2nd Digit  6.2 cm      Left Upper Extremity Lymphedema   15 cm Proximal to Olecranon Process  45 cm    10 cm Proximal to Olecranon Process  44 cm    Olecranon Process  29 cm    15 cm Proximal to Ulnar  Styloid Process  29.5 cm    Just Proximal to Ulnar Styloid Process  18 cm    Across Hand at [employer]  19 cm    At [place] of 2nd Digit  6 cm             Objective measurements completed on examination: See above findings.      [place] Adult PT Treatment/Exercise - 08/02/18 0001      Self-Care   Self-Care  Other Self-Care Comments    Other Self-Care Comments   provided large tg soft to both upper arms and instructed daughter to keep a deep fold in place Pt instucted to wear for comfort only                PT Short Term Goals - 08/02/18 2012      PT SHORT TERM GOAL #1   Title  Pt will report feelings of fullness in shoulders and trunk are decreased by at least 50% with manual lymph draiange and compression    Time  3    Period  Weeks    Status  New        PT Long Term Goals - 08/02/18 2014      PT LONG TERM GOAL #1   Title  Pt will have at least 125 degrees of both shoulder abduction so that she can  receive radiation and get her shirt on  by herself    Time  6    Period  Weeks    Status  New      PT LONG TERM GOAL #2   Title  Pt reports she has enough left and right shoulder strength so that she can get her dishes to the bottom shelf of the kitchen cabinet to prepare meals at home     Time  6    Period  Weeks      PT LONG TERM GOAL #3   Title  Pt will verbalize she has an understanding of lymphedema risk reduction practices    Time  6    Period  Weeks    Status  New             Plan - 08/02/18 2005    Clinical Impression Statement  Pt presents 2 weeks after bilateral mastectomy with incisions still healing, drains intact, increased post operative swelling in trunk and both upper arms and decreased range of motion and strenth.  She will benefit from velcro compression garments for both arms and manual lymph drainage for pain relief. Once drains are removed, she would benefit from PT for ROM and strength  as she prepares for radiation to right  upper chest     History and Personal Factors relevant to plan of care:  recent chemo and surgery, previous radiation to left chest , DM    Clinical Presentation  Evolving    Clinical Presentation due to:  will have radiation     Clinical Decision Making  Moderate    Rehab Potential  Good    Clinical Impairments Affecting Rehab Potential  as above    PT Frequency  2x / week    PT Duration  6 weeks    PT Treatment/Interventions  ADLs/Self Care Home Management;Therapeutic activities;Patient/family education;Manual lymph drainage;DME Instruction;Neuromuscular re-education;Orthotic Fit/Training;Therapeutic exercise;Manual techniques;Passive range of motion;Taping;Compression bandaging;Scar mobilization    PT Next Visit Plan  MLD for pain releif, coordinate with [facility] for velcro garments, once drains are out and activity is cleared, proceed with ROM and strength    Consulted and Agree with Plan of Care  Patient       Patient will benefit from skilled therapeutic intervention in order to improve the following deficits and impairments:  Decreased skin integrity, Decreased knowledge of use of DME, Increased fascial restricitons, Pain, Postural dysfunction, Decreased scar mobility, Decreased range of motion, Decreased strength, Impaired UE functional use, Obesity, Increased edema, Decreased knowledge of precautions  Visit Diagnosis: Aftercare following surgery for neoplasm - Plan: PT plan of care cert/re-cert  Postmastectomy lymphedema - Plan: PT plan of care cert/re-cert  Stiffness of right shoulder, not elsewhere classified - Plan: PT plan of care cert/re-cert  Stiffness of left shoulder joint - Plan: PT plan of care cert/re-cert  Muscle weakness (generalized) - Plan: PT plan of care cert/re-cert     Problem List Patient Active Problem List   Diagnosis Date Noted  . Cancer of left female breast  ([place]) 07/20/2018  . Bilateral lower extremity edema 06/15/2018  . Chemotherapy-induced  neuropathy 06/15/2018  . Shortness of breath on exertion 06/15/2018  . Chronic diastolic heart failure ([place]) 06/15/2018  . Port-A-Cath in place 04/25/2018  . Hypertensive retinopathy 09/09/2017  . Elevated TSH 08/15/2015  . Prolonged Q-T interval on ECG 08/14/2015  . Diabetic retinopathy ([place]) 03/26/2015  . Abnormality of gait 01/06/2013  . CTS (carpal tunnel syndrome) 03/09/2012  . Urinary  frequency 07/14/2011  . Chronic venous insufficiency   . Routine health maintenance 03/09/2011  . CAD (coronary artery disease)   . Benign essential HTN   . Hyperlipidemia   . Colonic polyp   . Morbid obesity ([place])   . Chronic pain   . Malignant neoplasm of lower-inner quadrant of left breast in female, estrogen receptor positive ([place])   . Iron deficiency anemia   . Seasonal allergies   . OSA (obstructive sleep apnea) 02/16/2007  . Type II diabetes mellitus 10/04/2004   Donato Heinz. Owens Shark PT  Norwood Levo 08/02/2018, 8:19 PM  [place], [place], [address] Phone: [phone]   Fax:  [phone]  Name: [name] MRN: [id number] Date of Birth: Jan 04, 1951

## 2018-08-02 NOTE — Patient Instructions (Signed)
First of all, check with your insurance company to see if provider is in network    [facility] (for wigs and compression sleeves / gloves/gauntlets )  [address] [place], [place] [address] [phone]  Will file some insurances --- call for appointment   [facility] (for mastectomy prosthetics and garments) [address] [place], [place] [address] [phone] Will file some insurances --- call for appointment  [facility]  [address]  [place], [place] [address] [phone] Lower extremity garments  [facility] [address] [place], [place]  [address] [phone]  Cathy Rubel ( Medicaid certified lymphedema fitter) [phone] [email or web address]  Melissa Meares  [facility]  [phone]  [facility] [address] [place], [place] [address] [phone]  Other Resources: National Lymphedema Network:  www.lymphnet.org www.Klosetraining.com for patient articles and self manual lymph drainage information www.lymphedemablog.com has informative articles.  www.compressionguru.com www.lymphedemaproducts.com www.brightlifedirect.com www.compressionguru.com 

## 2018-08-20 ENCOUNTER — Other Ambulatory Visit: Payer: Self-pay | Admitting: Internal Medicine

## 2018-08-21 NOTE — Telephone Encounter (Signed)
Cr OK. On most recent D/C summary.

## 2018-09-06 NOTE — Progress Notes (Signed)
Location of Breast Cancer: Left Breast  Histology per Pathology Report:  03/21/18 Diagnosis 1. Breast, left, needle core biopsy, 12 o'clock - INVASIVE DUCTAL CARCINOMA. - SEE COMMENT. 2. Breast, left, needle core biopsy, 7 o'clock - INVASIVE DUCTAL CARCINOMA.  Receptor Status: ER(40%), PR (NEG), Her2-neu (NEG), Ki-(80%)  05/11/18 Diagnosis Lymph node, needle/core biopsy, right axilla - METASTATIC POORLY DIFFERENTIATED CARCINOMA  Receptor Status: ER (NEG), PR (NEG), Her2-neu (NEG), Ki-(90%)  07/20/18 Diagnosis 1. Breast, modified radical mastectomy , Right - BENIGN BREAST PARENCHYMA. 1 of 5 FINAL for ARMANDO, LAUMAN ([id number]) Diagnosis(continued) - METASTATIC CARCINOMA PRESENT IN (7) OF (12) LYMPH NODES. 2. Skin , Upper Lateral Flap Left Breast - SKIN, NEGATIVE FOR CARCINOMA. 3. Skin , Upper Medial Flap Left Breast - SKIN, NEGATIVE FOR CARCINOMA. 4. Skin , Lower Medial Flap Left Breast - SKIN, NEGATIVE FOR CARCINOMA. 5. Skin , Lower Lateral Flap Left Breast - SKIN, NEGATIVE FOR CARCINOMA. 6. Breast, modified radical mastectomy , Left w/ Axillary contents - INVASIVE DUCTAL CARCINOMA STATUS-POST NEOADJUVANT TREATMENT. - METASTATIC CARCINOMA PRESENT IN (1) OF (2) LYMPH NODES.  6. Receptor Status: ER (NEG), PR (NEG), Her2 (NEG), Ki- (95%)  Did patient present with symptoms or was this found on screening mammography?: Skin changes noted to left breast ([place] skin) 03/2018  Past/Anticipated interventions by surgeon, if any: 07/20/18 PROCEDURE:  Procedure(s): BILATERAL MODIFIED RADICAL MASTECTOMIES (Bilateral) SURGEON:  Surgeon(s) and Role:    Jovita Kussmaul, MD - Primary    * [place], [place]  She will see Dr. Marlou Starks again on   Past/Anticipated interventions by medical oncology, if any:  09/07/18 Dr. Lindi Adie Treatment summary: 1. Neoadjuvant chemotherapy withTaxotere Cytoxan x4cycles completed 06/20/2018(due to cardiac issues she was  not a candidate for Adriamycin) 2.  Bilateral mastectomies 07/20/2018 3. Followed by Xeloda-radiation 4. Followed by antiestrogen therapy ---------------------------------------------------------------------------- 07/20/2018:Bilateral mastectomies:  Left mastectomy: Grade 3 IDC 6.5 cm,1/2lymph nodes positive, ER 40%, PR 0%, HER-2 negative, Ki-67 80%;  right axillary lymph node7/12 PositiveER 0%, PR 0%, HER-2 negative, Ki-67 90% RCB class III;  Right mastectomy: Benign ---------------------------------------------------- Planned treatment: Xeloda with radiation Patient seen radiation oncology on December 10.  Subsequently she will be set up for start radiation. In the meantime we will try to obtain Xeloda for the patient so she can take it along with radiation.  She will take 2 tablets in the morning and 2 tablets in the evening along with radiation.  Return to clinic in 6 weeks for follow-up with labs   Lymphedema issues, if any:  She is seeing PT 3 times weekly at this time.   Pain issues, if any:  She denies  SAFETY ISSUES:  Prior radiation? Yes, 2006 Left Breast 07/20/2005- 08/31/2005, whole left treated to 50 Gy in 25 fractions.   Pacemaker/ICD? No  Possible current pregnancy? No  Is the patient on methotrexate? 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 S/p lumpectomy and radiation.    BP (!) 171/71 (BP Location: Right Leg)   Pulse 80   Temp 98.4 F (36.9 C) (Oral)   Ht 5' 1" (1.549 m)   Wt 215 lb 12.8 oz (97.9 kg)   LMP 07/12/1984   SpO2 100% Comment: room air  BMI 40.78 kg/m    Wt Readings from Last 3 Encounters:  09/12/18 215 lb 12.8 oz (97.9 kg)  09/07/18 216  lb 4.8 oz (98.1 kg)  07/27/18 217 lb 1.6 oz (98.5 kg)      Azyah Flett, Stephani Police, RN 09/06/2018,3:57 PM

## 2018-09-06 NOTE — Progress Notes (Signed)
Patient Care Team: Bartholomew Crews, MD as PCP - General (Internal Medicine) Hayden Pedro, MD as Consulting Physician (Ophthalmology) Shirley Muscat Loreen Freud, MD as Referring Physician (Optometry) Minus Breeding, MD as Consulting Physician (Cardiology)  DIAGNOSIS:    ICD-10-CM   1. 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 S/p lumpectomy and radiation.    03/21/2018 Initial Diagnosis    Peau [place] Orange skin anterior left breast: Ultrasound 7 o'clock position 2 cm from nipple irregular hypoechoic mass 2.2 x 2.5 x 2.5 cm (1 cm medial to prior lumpectomy); 12:00: Hypoechoic mass extending to skin 3.9 x 2.9 x 3.6 cm market skin thickening, no axillary lymph nodes    03/21/2018 Initial Biopsy    Left breast 12 o'clock position: IDC grade 3; 7 o'clock position: IDC grade 3, ER 40%, PR 0%, Ki-67 80%, HER-2 equivocal by FISH ratio 1.17 copy #4.4, IHC 1+ negative, T4d N0 stage IIIc clinical stage     04/20/2018 Imaging    IMPRESSION: 1. Multiple masses within the left breast (inflammatory breast carcinoma.) 2. Cortically thickened right axillary lymph node, indeterminate. Metastatic disease not excluded (patient refused biopsy)    05/09/2018 - 06/20/2018 Neo-Adjuvant Chemotherapy    Neoadjuvant chemotherapy with TC X 4    07/20/2018 Surgery    Bilateral mastectomies: Left mastectomy: Grade 3 IDC 6.5 cm, 1/2 lymph nodes positive, ER 40%, PR 0%, HER-2 negative, Ki-67 80%; right axillary lymph node 7/12 LN pos ER 0%, PR 0%, HER-2 negative, Ki-67 90% RCB class III; right mastectomy: Benign    08/02/2018 Cancer Staging    Staging form: Breast, AJCC 8th Edition - Clinical: Stage IIIC (cT4d, cN0,  cM0, G3, ER+, PR-, HER2-) - Signed by Gardenia Phlegm, NP on 08/02/2018     CHIEF COMPLIANT: Follow-up of left breast cancer after bilateral mastectomies  INTERVAL HISTORY: [name] is a 67 y.o. with above-mentioned history of 2006 left breast cancer treated with lumpectomy and radiation who presented with inflammatory changes in the left breast as well as enlarged right axillary lymph nodes.  She underwent neoadjuvant chemotherapy with Taxotere Cytoxan and then underwent bilateral mastectomies. She presents to the clinic today with two family members to discuss recent blood work and upcoming radiation therapy. She has an appointment on 09/12/2018 with radiation. She notes her abdomen is swollen and she has had fluid collection in her upper back and pain around her port site. She reports she has not gotten Xeloda in the mail or from the pharmacy yet. She denies tingling or numbness. She uses a cane to get around.   REVIEW OF SYSTEMS:   Constitutional: Denies fevers, chills or abnormal weight loss Eyes: Denies blurriness of vision Ears, nose, mouth, throat, and face: Denies mucositis or sore throat Respiratory: Denies cough, dyspnea or wheezes Cardiovascular: Denies palpitation, chest discomfort Gastrointestinal:  Denies nausea, heartburn or change in bowel habits  Skin: Denies abnormal skin rashes Lymphatics: Denies new lymphadenopathy or easy bruising Neurological:Denies numbness, tingling or new weaknesses Behavioral/Psych: Mood is stable, no new changes  Extremities: No lower extremity edema Breast: denies any pain or lumps or nodules in either breasts All other systems were reviewed with the patient and are  negative.  I have reviewed the past medical history, past surgical history, social history and family history with the patient and they are unchanged from previous note.  ALLERGIES:  is allergic to lisinopril; oxycodone-acetaminophen; percocet  [oxycodone-acetaminophen]; victoza [liraglutide]; and atorvastatin.  MEDICATIONS:  Current Outpatient Medications  Medication Sig Dispense Refill  . ACCU-CHEK FASTCLIX LANCETS MISC Check blood sugar 3 times a day 102 each 5  . ACCU-CHEK GUIDE test strip USE 1  THREE TIMES DAILY 375 each 1  . amLODipine (NORVASC) 10 MG tablet TAKE ONE (1) TABLET BY MOUTH EVERY DAY (Patient taking differently: Take 10 mg by mouth daily. ) 90 tablet 3  . aspirin EC 81 MG tablet Take 81 mg by mouth daily.    . capecitabine (XELODA) 500 MG tablet Take 2 tablets (1,000 mg total) by mouth 2 (two) times daily after a meal. Take on radiation days only, M-F (Patient not taking: Reported on 08/02/2018) 132 tablet 0  . chlorpheniramine-HYDROcodone (TUSSIONEX PENNKINETIC ER) 10-8 MG/5ML SUER Take 5 mLs by mouth 2 (two) times daily. (Patient not taking: Reported on 08/02/2018) 140 mL 0  . Coenzyme Q10 (CO Q 10) 100 MG CAPS Take 100 mg by mouth daily.     Marland Kitchen dexamethasone (DECADRON) 4 MG tablet Take 1 tablet (4 mg total) by mouth daily. Take 1 tablet day before chemo and 1 tablet day after chemo (Patient not taking: Reported on 07/07/2018) 10 tablet 0  . ezetimibe (ZETIA) 10 MG tablet TAKE ONE (1) TABLET BY MOUTH EVERY DAY (Patient not taking: No sig reported) 30 tablet 7  . furosemide (LASIX) 20 MG tablet TAKE 1 TABLET (20 MG TOTAL) BY MOUTH AS NEEDED FOR EDEMA. (Patient taking differently: Take 20 mg by mouth daily as needed for edema. ) 30 tablet 2  . gabapentin (NEURONTIN) 300 MG capsule Take 1 capsule (300 mg total) by mouth at bedtime. 30 capsule 2  . insulin aspart (NOVOLOG FLEXPEN) 100 UNIT/ML FlexPen If 150-199 inject 2 units,200-250 inject 3 units,251- 350 inject 4 units,351-400 inject 5 units, call office. 15 mL 1  . insulin aspart protamine- aspart (NOVOLOG MIX 70/30) (70-30) 100 UNIT/ML injection Inject 2-5 Units into the skin See admin instructions. Inject per sliding scale 201-250 = 2 units, 251-300 = 3 units, 301-400  = 4 units, >401 = 5 units and call MD SQ 3 times daily.    . Insulin Glargine (TOUJEO SOLOSTAR) 300 UNIT/ML SOPN Inject 30 Units into the skin daily. 12 mL 3  . Insulin Pen Needle 32G X 4 MM MISC USE FOR INJECTIONS up to 4 times daily. Diagnosis code E11.40, Z79.4 200 each 5  . isosorbide mononitrate (IMDUR) 120 MG 24 hr tablet Take 2 tablets (240 mg total) by mouth every morning. (Patient taking differently: Take 120 mg by mouth 2 (two) times daily. ) 30 tablet 11  . lidocaine-prilocaine (EMLA) cream Apply to affected area once (Patient not taking: Reported on 08/02/2018) 30 g 3  . loratadine (CLARITIN) 10 MG tablet Take 10 mg by mouth daily as needed for allergies.    Marland Kitchen losartan (COZAAR) 50 MG tablet TAKE ONE (1) TABLET BY MOUTH EVERY DAY (Patient taking differently: Take 50 mg by mouth daily. ) 90 tablet 3  . meloxicam (MOBIC) 7.5 MG tablet Take 1 tablet (7.5 mg total) by mouth daily as needed for pain. 90 tablet 0  . metFORMIN (GLUCOPHAGE) 1000 MG tablet TAKE ONE (1) TABLET BY MOUTH TWO (2) TIMES DAILY WITH A MEAL (Patient taking differently:  Take 1,000 mg by mouth 2 (two) times daily with a meal. ) 180 tablet 3  . methocarbamol (ROBAXIN) 750 MG tablet Take 1 tablet (750 mg total) by mouth every 6 (six) hours as needed for muscle spasms. 30 tablet 1  . metoprolol succinate (TOPROL-XL) 100 MG 24 hr tablet TAKE ONE (1) TABLET BY MOUTH EVERY DAY (Patient taking differently: Take 100 mg by mouth daily. ) 90 tablet 3  . nitroGLYCERIN (NITRODUR - DOSED IN MG/24 HR) 0.4 mg/hr patch APPLY ONE PATCH ON SKIN EVERY DAY. LEAVE ON FOR 12 HOURS AND OFF FOR 12 HOURS. (Patient not taking: No sig reported) 100 patch 3  . nitroGLYCERIN (NITROSTAT) 0.4 MG SL tablet PLACE 1 TABLET UNDER THE TONGUE EVERY 5 MINUTES AS NEEDED FOR CHEST PAIN 25 tablet 7  . Olopatadine HCl (PATADAY) 0.2 % SOLN Place 1 drop into both eyes daily as needed (for allergies).     Marland Kitchen omeprazole (PRILOSEC) 40 MG capsule Take 1 capsule (40 mg total)  by mouth daily. (Patient taking differently: Take 40 mg by mouth daily as needed (for acid reflux). ) 30 capsule 5  . ondansetron (ZOFRAN) 8 MG tablet Take 1 tablet (8 mg total) by mouth 2 (two) times daily as needed for refractory nausea / vomiting. Start on day 3 after chemo. (Patient not taking: Reported on 07/07/2018) 30 tablet 1  . prochlorperazine (COMPAZINE) 10 MG tablet Take 1 tablet (10 mg total) by mouth every 6 (six) hours as needed (Nausea or vomiting). (Patient not taking: Reported on 08/02/2018) 60 tablet 2  . spironolactone (ALDACTONE) 25 MG tablet Take 1 tablet (25 mg total) by mouth daily. (Patient not taking: Reported on 08/02/2018) 90 tablet 3  . traMADol (ULTRAM) 50 MG tablet Take 1 tablet (50 mg total) by mouth every 12 (twelve) hours as needed. (Patient taking differently: Take 50 mg by mouth every 12 (twelve) hours as needed for moderate pain. ) 30 tablet 0  . traMADol (ULTRAM) 50 MG tablet Take 1-2 tablets (50-100 mg total) by mouth every 6 (six) hours as needed (pain not controlled with tylenol). 30 tablet 1  . vitamin B-12 (CYANOCOBALAMIN) 1000 MCG tablet Take 1,000 mcg by mouth daily.     No current facility-administered medications for this visit.     PHYSICAL EXAMINATION: ECOG PERFORMANCE STATUS: 1 - Symptomatic but completely ambulatory  Vitals:   09/07/18 1510  BP: (!) 160/71  Pulse: 87  Resp: 17  Temp: 98.3 F (36.8 C)  SpO2: 100%   Filed Weights   09/07/18 1510  Weight: 216 lb 4.8 oz (98.1 kg)    GENERAL:alert, no distress and comfortable SKIN: skin color, texture, turgor are normal, no rashes or significant lesions EYES: normal, Conjunctiva are pink and non-injected, sclera clear OROPHARYNX:no exudate, no erythema and lips, buccal mucosa, and tongue normal  NECK: supple, thyroid normal size, non-tender, without nodularity LYMPH:  no palpable lymphadenopathy in the cervical, axillary or inguinal LUNGS: clear to auscultation and percussion with normal  breathing effort HEART: regular rate & rhythm and no murmurs and no lower extremity edema ABDOMEN:abdomen soft, non-tender and normal bowel sounds MUSCULOSKELETAL:no cyanosis of digits and no clubbing  NEURO: alert & oriented x 3 with fluent speech, no focal motor/sensory deficits EXTREMITIES: No lower extremity edema  LABORATORY DATA:  I have reviewed the data as listed CMP Latest Ref Rng & Units 07/20/2018 07/20/2018 07/20/2018  Glucose 70 - 99 mg/dL - 275(H) -  BUN 8 - 23 mg/dL - 12 -  Creatinine 0.44 - 1.00 mg/dL - 0.77 -  Sodium 135 - 145 mmol/L 139 137 139  Potassium 3.5 - 5.1 mmol/L 4.5 4.5 4.5  Chloride 98 - 111 mmol/L - 108 -  CO2 22 - 32 mmol/L - 19(L) -  Calcium 8.9 - 10.3 mg/dL - 9.2 -  Total Protein 6.5 - 8.1 g/dL - 6.9 -  Total Bilirubin 0.3 - 1.2 mg/dL - 0.3 -  Alkaline Phos 38 - 126 U/L - 56 -  AST 15 - 41 U/L - 26 -  ALT 0 - 44 U/L - 19 -    Lab Results  Component Value Date   WBC 6.7 09/07/2018   HGB 10.3 (L) 09/07/2018   HCT 33.1 (L) 09/07/2018   MCV 90.2 09/07/2018   PLT 215 09/07/2018   NEUTROABS 3.5 09/07/2018    ASSESSMENT & PLAN:  Malignant neoplasm of lower-inner quadrant of left breast in female, estrogen receptor positive [facility]) 2006 June : 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 S/p lumpectomy and radiation.  03/21/2018:[place] skin anterior left breast: Ultrasound 7 o'clock position 2 cm from nipple irregular hypoechoic mass 2.2 x 2.5 x 2.5 cm (1 cm medial to prior lumpectomy); 12:00: Hypoechoic mass extending to skin 3.9 x 2.9 x 3.6 cm market skin thickening, no axillary lymph nodes  Left breast 12 o'clock position: IDC grade 3; 7 o'clock position: IDC grade 3, ER 40%, PR 0%, Ki-67 80%, HER-2 equivocal by FISH ratio 1.17 copy #4.4, IHC 1+ negative, T4d N0 stage IIIc clinical stage  Right breast and axillary ultrasound: Prominent right axillary lymph node.Biopsy revealed metastatic poorly differentiated  carcinoma ER 0%, PR 0%, Ki-67 90%  CT CAP 04/20/18:Breast masses and axillary lymph node, no metastatic disease elsewhere ----------------------------------------------------------------------------- Treatment summary: 1. Neoadjuvant chemotherapy withTaxotere Cytoxan x4cycles completed 06/20/2018 (due to cardiac issues she was not a candidate for Adriamycin) 2.  Bilateral mastectomies 07/20/2018 3. Followed by Xeloda-radiation 4. Followed by antiestrogen therapy ---------------------------------------------------------------------------- 07/20/2018: Bilateral mastectomies:  Left mastectomy: Grade 3 IDC 6.5 cm, 1/2 lymph nodes positive, ER 40%, PR 0%, HER-2 negative, Ki-67 80%;  right axillary lymph node 7/12 Positive ER 0%, PR 0%, HER-2 negative, Ki-67 90% RCB class III;  Right mastectomy: Benign ---------------------------------------------------- Planned treatment: Xeloda with radiation Patient seen radiation oncology on December 10.  Subsequently she will be set up for start radiation. In the meantime we will try to obtain Xeloda for the patient so she can take it along with radiation.  She will take 2 tablets in the morning and 2 tablets in the evening along with radiation.  Return to clinic in 6 weeks for follow-up with labs   No orders of the defined types were placed in this encounter.  The patient has a good understanding of the overall plan. she agrees with it. she will call with any problems that may develop before the next visit here.  Nicholas Lose, MD 09/07/2018   I, Cloyde Reams Dorshimer, am acting as scribe for Nicholas Lose, MD.  I have reviewed the above documentation for accuracy and completeness, and I agree with the above.

## 2018-09-07 ENCOUNTER — Inpatient Hospital Stay: Payer: Medicare Other

## 2018-09-07 ENCOUNTER — Telehealth: Payer: Self-pay | Admitting: Hematology and Oncology

## 2018-09-07 ENCOUNTER — Other Ambulatory Visit: Payer: Medicare Other

## 2018-09-07 ENCOUNTER — Inpatient Hospital Stay: Payer: Medicare Other | Attending: Hematology and Oncology | Admitting: Hematology and Oncology

## 2018-09-07 DIAGNOSIS — Z170 Estrogen receptor positive status [ER+]: Secondary | ICD-10-CM | POA: Insufficient documentation

## 2018-09-07 DIAGNOSIS — C50312 Malignant neoplasm of lower-inner quadrant of left female breast: Secondary | ICD-10-CM

## 2018-09-07 DIAGNOSIS — Z95828 Presence of other vascular implants and grafts: Secondary | ICD-10-CM

## 2018-09-07 DIAGNOSIS — C50812 Malignant neoplasm of overlapping sites of left female breast: Secondary | ICD-10-CM

## 2018-09-07 DIAGNOSIS — Z452 Encounter for adjustment and management of vascular access device: Secondary | ICD-10-CM | POA: Insufficient documentation

## 2018-09-07 DIAGNOSIS — C773 Secondary and unspecified malignant neoplasm of axilla and upper limb lymph nodes: Secondary | ICD-10-CM | POA: Insufficient documentation

## 2018-09-07 LAB — CMP (CANCER CENTER ONLY)
ALT: 13 U/L (ref 0–44)
AST: 14 U/L — ABNORMAL LOW (ref 15–41)
Albumin: 3.4 g/dL — ABNORMAL LOW (ref 3.5–5.0)
Alkaline Phosphatase: 74 U/L (ref 38–126)
Anion gap: 10 (ref 5–15)
BUN: 15 mg/dL (ref 8–23)
CALCIUM: 9.8 mg/dL (ref 8.9–10.3)
CO2: 25 mmol/L (ref 22–32)
Chloride: 107 mmol/L (ref 98–111)
Creatinine: 0.85 mg/dL (ref 0.44–1.00)
GFR, Estimated: >60 mL/min (ref >60)
GLUCOSE: 138 mg/dL — AB (ref 70–99)
Potassium: 3.9 mmol/L (ref 3.5–5.1)
SODIUM: 142 mmol/L (ref 135–145)
Total Protein: 7.5 g/dL (ref 6.5–8.1)

## 2018-09-07 LAB — CBC WITH DIFFERENTIAL (CANCER CENTER ONLY)
Abs Immature Granulocytes: 0.02 10*3/uL (ref 0.00–0.07)
Basophils Absolute: 0 10*3/uL (ref 0.0–0.1)
Basophils Relative: 0 %
EOS ABS: 0.2 10*3/uL (ref 0.0–0.5)
EOS PCT: 3 %
HEMATOCRIT: 33.1 % — AB (ref 36.0–46.0)
Hemoglobin: 10.3 g/dL — ABNORMAL LOW (ref 12.0–15.0)
Immature Granulocytes: 0 %
LYMPHS ABS: 2.3 10*3/uL (ref 0.7–4.0)
Lymphocytes Relative: 35 %
MCH: 28.1 pg (ref 26.0–34.0)
MCHC: 31.1 g/dL (ref 30.0–36.0)
MCV: 90.2 fL (ref 80.0–100.0)
MONOS PCT: 9 %
Monocytes Absolute: 0.6 10*3/uL (ref 0.1–1.0)
Neutro Abs: 3.5 10*3/uL (ref 1.7–7.7)
Neutrophils Relative %: 53 %
Platelet Count: 215 10*3/uL (ref 150–400)
RBC: 3.67 MIL/uL — ABNORMAL LOW (ref 3.87–5.11)
RDW: 15.9 % — AB (ref 11.5–15.5)
WBC Count: 6.7 10*3/uL (ref 4.0–10.5)
nRBC: 0 % (ref 0.0–0.2)

## 2018-09-07 MED ORDER — SODIUM CHLORIDE 0.9% FLUSH
10.0000 mL | Freq: Once | INTRAVENOUS | Status: AC
  Administered 2018-09-07: 10 mL
  Filled 2018-09-07: qty 10

## 2018-09-07 MED ORDER — HEPARIN SOD (PORK) LOCK FLUSH 100 UNIT/ML IV SOLN
500.0000 [IU] | Freq: Once | INTRAVENOUS | Status: AC
  Administered 2018-09-07: 500 [IU]
  Filled 2018-09-07: qty 5

## 2018-09-07 NOTE — Assessment & Plan Note (Signed)
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 S/p lumpectomy and radiation.  03/21/2018:[place] skin anterior left breast: Ultrasound 7 o'clock position 2 cm from nipple irregular hypoechoic mass 2.2 x 2.5 x 2.5 cm (1 cm medial to prior lumpectomy); 12:00: Hypoechoic mass extending to skin 3.9 x 2.9 x 3.6 cm market skin thickening, no axillary lymph nodes  Left breast 12 o'clock position: IDC grade 3; 7 o'clock position: IDC grade 3, ER 40%, PR 0%, Ki-67 80%, HER-2 equivocal by FISH ratio 1.17 copy #4.4, IHC 1+ negative, T4d N0 stage IIIc clinical stage  Right breast and axillary ultrasound: Prominent right axillary lymph node.Biopsy revealed metastatic poorly differentiated carcinoma ER 0%, PR 0%, Ki-67 90%  CT CAP 04/20/18:Breast masses and axillary lymph node, no metastatic disease elsewhere ----------------------------------------------------------------------------- Treatment summary: 1. Neoadjuvant chemotherapy withTaxotere Cytoxan x4cycles completed 06/20/2018 (due to cardiac issues she was not a candidate for Adriamycin) 2.  Bilateral mastectomies 07/20/2018 3. Followed by Xeloda-radiation 4. Followed by antiestrogen therapy ---------------------------------------------------------------------------- 07/20/2018: Bilateral mastectomies:  Left mastectomy: Grade 3 IDC 6.5 cm, 1/2 lymph nodes positive, ER 40%, PR 0%, HER-2 negative, Ki-67 80%;  right axillary lymph node 7/12 Positive ER 0%, PR 0%, HER-2 negative, Ki-67 90% RCB class III;  Right mastectomy: Benign ---------------------------------------------------- Current treatment: Xeloda with radiation Return to clinic monthly for lab checks 

## 2018-09-07 NOTE — Telephone Encounter (Signed)
Gave avs and calendar ° °

## 2018-09-08 ENCOUNTER — Telehealth: Payer: Self-pay | Admitting: Pharmacist

## 2018-09-08 NOTE — Telephone Encounter (Signed)
Oral Oncology Pharmacist Encounter  Received new prescription for Xeloda (capecitabine) for the adjuvant treatment of stage IIIc breast cancer in conjunction with radiation, planned duration 6 1/2 weeks of therapy.  Labs from Epic assessed, [place] for treatment.  Xeloda will be dosed at ~490 mg/m2 twice daily on days of radiation only, Monday - Friday Radiation is planned for 33 treatment days, schedule is not yet set CT consult is scheduled for 09/12/2018  Current medication list in Epic reviewed, no significant DDIs with Xeloda identified:  Category C interaction between Xeloda and omeprazole: Concurrent use of proton pump inhibitor was associated with poor progression free survival and overall survival in a secondary analysis of a retrospective trial performed and gastric cancer patients with the use of Xeloda.  Subsequent multivariate analysis determined that there was not a decrease in important efficacy endpoints with the concurrent use of Xeloda and proton pump inhibitor therapy.  Patient will be screened for discontinuation of PPI.  No change to current therapy is indicated at this time.  Prescription has been e-scribed to the [facility] for benefits analysis and approval.  Oral Oncology Clinic will continue to follow for insurance authorization, copayment issues, initial counseling and start date.  Johny Drilling, PharmD, BCPS, BCOP  09/08/2018  2:58 PM  Oral Oncology Clinic

## 2018-09-08 NOTE — Telephone Encounter (Signed)
Oral Chemotherapy Pharmacist Encounter   I spoke with patient for overview of: Xeloda (capecitabine)for the adjuvanttreatment of stage IIIc breast cancerin conjunction with radiation, planned duration6 1/2 weeks of therapy.   Counseled patient on administration, dosing, side effects, monitoring, drug-food interactions, safe handling, storage, and disposal.  Patient will take Xeloda 500mg  tablets, 2 tablets (1000mg ) by mouth in AM and 2 tabs (1000mg ) by mouth in PM, within 30 minutes of finishing meals, on days of radiation only.  Xeloda and radiation start date: TBD, radiation consult on 09/12/2018  Adverse effects of Xeloda include but are not limited to: fatigue, decreased blood counts, GI upset, diarrhea, mouth sores, and hand-foot syndrome.  Patient has anti-emetic on hand and knows to take it if nausea develops.   Patient will obtain anti diarrheal and alert the office of 4 or more loose stools above baseline.   Reviewed with patient importance of keeping a medication schedule and plan for any missed doses.  [name] voiced understanding and appreciation.   All questions answered.  Medication reconciliation performed and medication/allergy list updated.  Oral oncology patient advocate will be reaching out to patient early week of 09/11/2018 to deliver copayment information and to coordinate medication acquisition.  Patient knows to call the office with questions or concerns. Oral Oncology Clinic will continue to follow.  [name], PharmD, BCPS, BCOP  09/08/2018  3:17 PM Oral Oncology Clinic [phone]

## 2018-09-11 ENCOUNTER — Other Ambulatory Visit: Payer: Self-pay

## 2018-09-11 ENCOUNTER — Encounter: Payer: Self-pay | Admitting: Physical Therapy

## 2018-09-11 ENCOUNTER — Ambulatory Visit: Payer: Medicare Other | Attending: General Surgery | Admitting: Physical Therapy

## 2018-09-11 DIAGNOSIS — M25612 Stiffness of left shoulder, not elsewhere classified: Secondary | ICD-10-CM | POA: Insufficient documentation

## 2018-09-11 DIAGNOSIS — I972 Postmastectomy lymphedema syndrome: Secondary | ICD-10-CM

## 2018-09-11 DIAGNOSIS — M6281 Muscle weakness (generalized): Secondary | ICD-10-CM | POA: Diagnosis not present

## 2018-09-11 DIAGNOSIS — M25611 Stiffness of right shoulder, not elsewhere classified: Secondary | ICD-10-CM

## 2018-09-11 DIAGNOSIS — Z483 Aftercare following surgery for neoplasm: Secondary | ICD-10-CM | POA: Diagnosis not present

## 2018-09-11 NOTE — Therapy (Signed)
[place], [place], [address] Phone: [phone]   Fax:  [phone]  Physical Therapy Treatment  Patient Details  Name: [name] MRN: [id number] Date of Birth: May 20, 1951 Referring Provider (PT): Dr. Marlou Starks    Encounter Date: 09/11/2018  PT End of Session - 09/11/18 1219    Visit Number  2    Number of Visits  25    Date for PT Re-Evaluation  11/06/17    PT Start Time  0935    PT Stop Time  1017    PT Time Calculation (min)  42 min    Activity Tolerance  Patient tolerated treatment well    Behavior During Therapy  [facility] for tasks assessed/performed       Past Medical History:  Diagnosis Date  . Arthritis    "knees, lower back" (08/18/2017)  . 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 S/p lumpectomy and radiation.  Marland Kitchen CAD (coronary artery disease) 11/07   cath 11/11 :  3V CADsee report   . Cataract    small both eyes  . Chronic lower back pain   . Chronic pain    MR 08/07/10 :  Spondylosis L4-5 with B foraminal narrowing and L4 nerve root enchroachment B.  . Chronic venous insufficiency 2012   Has had full W/U incl ECHO, LFT's, Cr, Umicro, TSH, BNP. Symptomatic treatment.  . Colonic polyp 1995   Colonoscopies 1994, 2000, and 02/02/2011. Last had 9 polyps - Tubular adenoma and tubulovillous was the pathology results without high grade dysplasia  . Diabetes mellitus type II    Insulin dependent. Has worked with [name] R previously.  . Diastolic CHF, chronic ([place])    NL EF by echo 01/2012, Gr I dd  . Essential hypertension    Requires 5 drug therapy and still difficult to control.  Marland Kitchen GERD (gastroesophageal reflux disease)   . Heart murmur   . Hyperlipidemia    On daily statin  . Iron deficiency anemia    Ferritin was 12 in 2012. on FeSo4. Has had colon and EGD 02/02/11 that showed mild gastritis and colon polyps.  . Obesity    Morbid. HAs  worked with [name] T previously.  . OSA (obstructive sleep apnea) 02/16/2007   02/2007 : Moderate AHI 35.6, O2 sat decreased to 65%, CPAP titration to 16 with AHI of 3.6. 02/2014 : Rare respiratory events with sleep disturbance, within normal limits. AHI 0.4 per hour      . OSA on CPAP   . Personal history of chemotherapy   . Personal history of radiation therapy    "to my left breast"  . Refusal of blood transfusions as patient is Jehovah's Witness   . Seasonal allergies    "grass pollen"    Past Surgical History:  Procedure Laterality Date  . BREAST LUMPECTOMY Left 2006  . BREAST LUMPECTOMY W/ NEEDLE LOCALIZATION  2006   34 Chemo RX  . CARDIAC CATHETERIZATION N/A 08/15/2015   Procedure: Left Heart Cath and Coronary Angiography;  Surgeon: Troy Sine, MD;  Location: [place] [facility];  Service: Cardiovascular;  Laterality: N/A;  . COLONOSCOPY W/ BIOPSIES AND POLYPECTOMY  1994, 2000, 2012, 26/018  . IR IMAGING GUIDED PORT INSERTION  04/17/2018  . LEFT HEART CATH AND CORONARY ANGIOGRAPHY N/A 08/19/2017   Procedure: LEFT HEART CATH AND CORONARY ANGIOGRAPHY;  Surgeon: Belva Crome, MD;  Location: [facility];  Service: Cardiovascular;  Laterality: N/A;  . MASTECTOMY MODIFIED RADICAL Bilateral 07/20/2018  . MASTECTOMY MODIFIED RADICAL Bilateral 07/20/2018   Procedure: BILATERAL MODIFIED RADICAL MASTECTOMIES;  Surgeon: Jovita Kussmaul, MD;  Location: [facility];  Service: General;  Laterality: Bilateral;  . PORTACATH PLACEMENT N/A 04/14/2018   Procedure: ATTEMPED INSERTION PORT-A-CATH;  Surgeon: Jovita Kussmaul, MD;  Location: [facility];  Service: General;  Laterality: N/A;  . TONSILLECTOMY AND ADENOIDECTOMY  1964  . TOTAL ABDOMINAL HYSTERECTOMY  1985   "I had endometrosis"  . ULTRASOUND GUIDANCE FOR VASCULAR ACCESS  08/19/2017   Procedure: Ultrasound Guidance For Vascular Access;  Surgeon: Belva Crome, MD;  Location: [facility];  Service: Cardiovascular;;    There were no  vitals filed for this visit.  Subjective Assessment - 09/11/18 0936    Subjective  I start radiation this month and I go in on the 10th of December. I am having a lot of tightness across my chest. I have swelling in both arms in my sides and my abdomen. I do my exercises every morning and three times a day.     Pertinent History   Pt had breast cancer diagnosed in May 2019 with 2 blisters popped up on left breast that got worse in June, She was diagnosed with left inflammatory breast cancer and had right axillay node involvment also.  Pt has bilateral mastectomy on October 17 with 1/2 nodes positive on left and 7/12/ nodes positive on right  She underwent chemotherapy from July - Sept and had some problems with fatigue and tingling in hands and fingertips She was not able to get the full does of chemo due to CHF.  The surgery was a success and she plans to start radiation to the right side on Dec. 10 Past history includes lumpectomy and radiation to left in 2006, CHF. lower back pain that causes her to walk with a cane  Past history also includes diabetes and high blood pressure, pt had drains removed from mastectomy on 09/05/18    Patient Stated Goals  to be able to get my limbs back in circulation and get rid of this fluid    Currently in Pain?  Yes    Pain Score  3     Pain Location  Chest   and under bilateral axilla   Pain Orientation  Right;Left;Medial    Pain Descriptors / Indicators  Tightness;Discomfort    Pain Type  Surgical pain    Pain Onset  In the past 7 days    Pain Frequency  Constant    Aggravating Factors   starting to move around    Pain Relieving Factors  tramadol    Effect of Pain on Daily Activities  difficulty sweeping floor and mopping         [facility] PT Assessment - 09/11/18 0001      ROM / Strength   AROM / PROM / Strength  AROM      AROM   AROM Assessment Site  Shoulder    Right/Left Shoulder  Right;Left    Right Shoulder Flexion  106 Degrees    Right Shoulder  ABduction  80 Degrees    Right Shoulder Internal Rotation  --   not tested due to pain   Right Shoulder External Rotation  --   not tested due to pain   Left Shoulder Flexion  114 Degrees    Left Shoulder ABduction  59 Degrees    Left Shoulder Internal Rotation  --  not tested due to pain   Left Shoulder External Rotation  --   not tested due to pain       LYMPHEDEMA/ONCOLOGY QUESTIONNAIRE - 09/11/18 0954      Type   Cancer Type  left breast cancer      Surgeries   Mastectomy Date  07/20/18    Axillary Lymph Node Dissection Date  07/20/18    Number Lymph Nodes Removed  --   pt reports Dr removed a lot of lymph nodes from both sides     Date Lymphedema/Swelling Started   Date  07/20/18      Treatment   Active Chemotherapy Treatment  No   will begin pill this week   Past Chemotherapy Treatment  Yes    Active Radiation Treatment  No   pt will begin next week   Past Radiation Treatment  No    Current Hormone Treatment  No    Past Hormone Therapy  No      What other symptoms do you have   Are you Having Heaviness or Tightness  Yes    Are you having Pain  Yes    Are you having pitting edema  No    Is it Hard or Difficult finding clothes that fit  Yes    Do you have infections  No    Is there Decreased scar mobility  Yes      Right Upper Extremity Lymphedema   15 cm Proximal to Olecranon Process  45.1 cm    10 cm Proximal to Olecranon Process  41.5 cm    Olecranon Process  29 cm    15 cm Proximal to Ulnar Styloid Process  28.5 cm    Just Proximal to Ulnar Styloid Process  18.4 cm    Across Hand at [employer]  20 cm    At [place] of 2nd Digit  6.5 cm      Left Upper Extremity Lymphedema   15 cm Proximal to Olecranon Process  45 cm    10 cm Proximal to Olecranon Process  42 cm    Olecranon Process  29.6 cm    15 cm Proximal to Ulnar Styloid Process  30.5 cm    Just Proximal to Ulnar Styloid Process  19.5 cm    Across Hand at [employer]  20 cm    At [place]  of 2nd Digit  6.5 cm                        PT Education - 09/11/18 1216    Education Details  lymphedema risk reduction practices, post op breast exercises    Person(s) Educated  Patient    Methods  Explanation;Handout    Comprehension  Verbalized understanding;Returned demonstration       PT Short Term Goals - 08/02/18 2012      PT SHORT TERM GOAL #1   Title  Pt will report feelings of fullness in shoulders and trunk are decreased by at least 50% with manual lymph draiange and compression    Time  3    Period  Weeks    Status  New        PT Long Term Goals - 09/11/18 1217      PT LONG TERM GOAL #1   Title  Pt will have at least 125 degrees of both shoulder abduction so that she can receive radiation and get her shirt on  by herself  Time  8    Period  Weeks    Status  New    [place] Date  11/06/18      PT LONG TERM GOAL #2   Title  Pt reports she has enough left and right shoulder strength so that she can get her dishes to the bottom shelf of the kitchen cabinet to prepare meals at home     Time  8    Period  Weeks    Status  New    Target Date  11/06/18      PT LONG TERM GOAL #3   Title  Pt will verbalize she has an understanding of lymphedema risk reduction practices    Time  8    Period  Weeks    Status  New    Target Date  11/06/18      PT LONG TERM GOAL #4   Title  Pt will obtain appropriate compression garments for long term managment of lymphedema of BUE and chest.    Time  8    Period  Weeks    Status  New    Target Date  11/06/18      PT LONG TERM GOAL #5   Title  Pt will report a 70% improvement in feelings of tightness and discomfort across chest to allow improved comfort.     Time  8    Period  Weeks    Status  New    Target Date  11/06/18            Plan - 09/11/18 1220    Clinical Impression Statement  Pt returns to PT after having her last drain removed last week. She reports since she had her drain removed she has  been having increased swelling across her chest and in bilateral UEs. She feels tightness and discomfort from the swelling in her chest and reports it is hard to breathe sometimes at night due to the tightness. Pt has decreased bilateral shoulder ROM and a lot of pain with shoulder movements. She will be beginning radiation soon and will need more ROM to complete her treatments. Will update pt's plan of care today as she would benefit from physical therapy 3x/wk for 8 weeks though will most likely decrease to 2x/wk once her symptoms are more managed.     Rehab Potential  Good    Clinical Impairments Affecting Rehab Potential  pt will be starting radiation    PT Frequency  3x / week    PT Duration  8 weeks    PT Treatment/Interventions  ADLs/Self Care Home Management;Therapeutic activities;Patient/family education;Manual lymph drainage;DME Instruction;Neuromuscular re-education;Orthotic Fit/Training;Therapeutic exercise;Manual techniques;Passive range of motion;Taping;Compression bandaging;Scar mobilization    PT Next Visit Plan  MLD to bilateral chest, trunk, and UEs, PROM to bilateral shoulders, maybe bandage around chest coordinate with [employer] for velcro garments    PT Home Exercise Plan  post op breast    Consulted and Agree with Plan of Care  Patient       Patient will benefit from skilled therapeutic intervention in order to improve the following deficits and impairments:  Decreased skin integrity, Decreased knowledge of use of DME, Increased fascial restricitons, Pain, Postural dysfunction, Decreased scar mobility, Decreased range of motion, Decreased strength, Impaired UE functional use, Obesity, Increased edema, Decreased knowledge of precautions  Visit Diagnosis: Postmastectomy lymphedema  Stiffness of right shoulder, not elsewhere classified  Stiffness of left shoulder joint  Muscle weakness (generalized)  Aftercare following surgery for neoplasm  Problem List Patient Active  Problem List   Diagnosis Date Noted  . Cancer of left female breast  ([place]) 07/20/2018  . Bilateral lower extremity edema 06/15/2018  . 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 Shortness of breath on exertion 06/15/2018  . Chronic diastolic heart failure ([place]) 06/15/2018  . Port-A-Cath in place 04/25/2018  . Hypertensive retinopathy 09/09/2017  . Elevated TSH 08/15/2015  . Prolonged Q-T interval on ECG 08/14/2015  . Diabetic retinopathy ([place]) 03/26/2015  . Abnormality of gait 01/06/2013  . CTS (carpal tunnel syndrome) 03/09/2012  . Urinary frequency 07/14/2011  . Chronic venous insufficiency   . Routine health maintenance 03/09/2011  . CAD (coronary artery disease)   . Benign essential HTN   . Hyperlipidemia   . Colonic polyp   . Morbid obesity ([place])   . Chronic pain   . Malignant neoplasm of lower-inner quadrant of left breast in female, estrogen receptor positive ([place])   . Iron deficiency anemia   . Seasonal allergies   . OSA (obstructive sleep apnea) 02/16/2007  . Type II diabetes mellitus 10/04/2004    Allyson Sabal [facility] 09/11/2018, 12:30 PM  [place], [place], [address] Phone: [phone]   Fax:  [phone]  Name: [name] MRN: [id number] Date of Birth: 09/07/1951  Manus Gunning, PT 09/11/18 12:30 PM

## 2018-09-12 ENCOUNTER — Encounter: Payer: Self-pay | Admitting: Radiation Oncology

## 2018-09-12 ENCOUNTER — Other Ambulatory Visit: Payer: Self-pay

## 2018-09-12 ENCOUNTER — Ambulatory Visit
Admission: RE | Admit: 2018-09-12 | Discharge: 2018-09-12 | Disposition: A | Discharge status: Home / Self Care | Payer: Medicare Other | Source: Ambulatory Visit | Attending: Radiation Oncology | Admitting: Radiation Oncology

## 2018-09-12 ENCOUNTER — Ambulatory Visit: Payer: Medicare Other

## 2018-09-12 VITALS — BP 171/71 | HR 80 | Temp 98.4°F | Ht 61.0 in | Wt 215.8 lb

## 2018-09-12 DIAGNOSIS — M25612 Stiffness of left shoulder, not elsewhere classified: Secondary | ICD-10-CM | POA: Diagnosis not present

## 2018-09-12 DIAGNOSIS — I972 Postmastectomy lymphedema syndrome: Secondary | ICD-10-CM

## 2018-09-12 DIAGNOSIS — Z794 Long term (current) use of insulin: Secondary | ICD-10-CM | POA: Insufficient documentation

## 2018-09-12 DIAGNOSIS — Z86000 Personal history of in-situ neoplasm of breast: Secondary | ICD-10-CM | POA: Diagnosis not present

## 2018-09-12 DIAGNOSIS — Z170 Estrogen receptor positive status [ER+]: Secondary | ICD-10-CM | POA: Insufficient documentation

## 2018-09-12 DIAGNOSIS — Z923 Personal history of irradiation: Secondary | ICD-10-CM | POA: Diagnosis not present

## 2018-09-12 DIAGNOSIS — Z79899 Other long term (current) drug therapy: Secondary | ICD-10-CM | POA: Diagnosis not present

## 2018-09-12 DIAGNOSIS — C50611 Malignant neoplasm of axillary tail of right female breast: Secondary | ICD-10-CM | POA: Diagnosis not present

## 2018-09-12 DIAGNOSIS — M6281 Muscle weakness (generalized): Secondary | ICD-10-CM | POA: Diagnosis not present

## 2018-09-12 DIAGNOSIS — Z9013 Acquired absence of bilateral breasts and nipples: Secondary | ICD-10-CM | POA: Diagnosis not present

## 2018-09-12 DIAGNOSIS — M25611 Stiffness of right shoulder, not elsewhere classified: Secondary | ICD-10-CM | POA: Diagnosis not present

## 2018-09-12 DIAGNOSIS — Z483 Aftercare following surgery for neoplasm: Secondary | ICD-10-CM | POA: Diagnosis not present

## 2018-09-12 DIAGNOSIS — C50312 Malignant neoplasm of lower-inner quadrant of left female breast: Secondary | ICD-10-CM | POA: Diagnosis not present

## 2018-09-12 DIAGNOSIS — Z9221 Personal history of antineoplastic chemotherapy: Secondary | ICD-10-CM | POA: Diagnosis not present

## 2018-09-12 DIAGNOSIS — C773 Secondary and unspecified malignant neoplasm of axilla and upper limb lymph nodes: Secondary | ICD-10-CM | POA: Diagnosis not present

## 2018-09-12 NOTE — Progress Notes (Signed)
Radiation Oncology         ([phone]) [phone] ________________________________  Initial Outpatient Consultation  Name: [name] MRN: [id number]  Date: 09/12/2018  DOB: 1951-06-18  UT:[id number], Real Cons, MD  Nicholas Lose, MD   REFERRING PHYSICIAN: Nicholas Lose, MD  DIAGNOSIS:    ICD-10-CM   1. Malignant neoplasm of lower-inner quadrant of left breast in female, estrogen receptor positive ([place]) C50.312 Ambulatory referral to Social Work   Z17.0   2. Carcinoma of axillary tail of breast, right ([place]) C50.611   Cancer Staging Carcinoma of axillary tail of breast, right ([place]) Staging form: Breast, AJCC 8th Edition - Pathologic: No Stage Recommended (ypT0, pN2a, cM0, G3, ER-, PR-, HER2-) - Signed by Eppie Gibson, MD on 09/15/2018  Malignant neoplasm of lower-inner quadrant of left breast in female, estrogen receptor positive ([place]) Staging form: Breast, AJCC 8th Edition - Pathologic: No Stage Recommended (ypT4d, pN1a, cM0, G3, ER-, PR-, HER2-) - Signed by Eppie Gibson, MD on 09/15/2018 - Clinical: Stage IIIC (cT4d, cN0, cM0, G3, ER+, PR-, HER2-) - Signed by Gardenia Phlegm, NP on 08/02/2018   CHIEF COMPLAINT: Here to discuss management of breast cancer  HISTORY OF PRESENT ILLNESS::[name] is a 67 y.o. 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  She presented to medical attention in June 2019 with a one-month history of left breast redness, swelling, and blistering. Diagnostic mammogram and ultrasound taken on 03/15/18 showed findings highly suspicious for multicentric inflammatory breast cancer. There was a discrete mass at 7:00 measuring 2.5 cm, a discrete mass at 12:00 measuring 3.9 cm, and marked skin thickening and hypervascularity in the medial left breast. Sonographic evaluation of the left axilla demonstrated no evidence of lymphadenopathy.  Biopsies of the 12:00 mass  and the 7:00 mass on 03/21/18 revealed grade 3 invasive ductal carcinoma; ER positive, PR negative, Her2 negative.  Staging CT C/A/P scans on 04/20/18 showed multiple masses within the left breast, compatible with diagnosed left breast carcinoma. There was overlying skin thickening, concerning for inflammatory breast carcinoma. There was also a cortically thickened right axillary lymph node, indeterminate. No evidence for additional sites of disease within the chest, abdomen, or pelvis.  Bone scan done on 04/26/18 did not show any scintigraphic evidence of skeletal metastasis. There were multiple sites of disc osteophytic disease in the thoracic and lumbar spine.  Routine mammogram and ultrasound of the right breast were taken on 05/05/18 showed a single prominent inferior right axillary lymph node, corresponding to the lymph node identified on July CT. No mammographic evidence of malignancy in the right breast.  Biopsy of the suspicious right axillary lymph node on 05/11/18 revealed metastatic poorly differentiated carcinoma; ER negative, PR negative, Her2 negative.  She received neoadjuvant chemotherapy with Taxotere Cytoxan x4 cycles from 05/09/18 to 06/20/18.  Follow-up MRI of the bilateral breasts on 06/22/18 showed extensive abnormal non-mass enhancement within the left breast, extending greater than 6 cm from the 12:00 axis, anterior to middle depth, to the left nipple. There was additional suspicious non-mass enhancement within the UIQ of the left breast, middle to posterior depth, measuring 2.2 x 2.1 cm, representing additional multicentric disease. No evidence of contralateral disease within the right breast. However, there were 4 abnormal lymph nodes within the right axilla, all with central necrosis indicating metastases. The largest lymph node in the right axilla was a level 1 lymph node, measuring 2.8 cm, compatible with biopsy-proven metastasis. No  abnormal lymph nodes were identified within the left  axilla or internal mammary chain regions.  She underwent bilateral mastectomies on 07/20/18. Final pathology revealed grade 3 invasive ductal carcinoma, measuring 6.5 cm, with invasive carcinoma 2 cm from closest margin (anterior) and involvement of the skin and nipple; ER neg, PR negative, Her2 negative. There was metastatic carcinoma present in 1 of 2 left axillary lymph nodes. The right breast was benign, however there was metastatic carcinoma present in 7 of 12 right axillary lymph nodes; ER negative, PR negative, Her2 negative.  On review of systems, she reports fatigue from chemotherapy. She reports bilateral lymphedema and muscle tightness in her arms and is seeing physical therapy 3 times per week.  She has congestive heart failure which she states developed after her radiotherapy for her DCIS in 2006.  She perceives that the CHF was caused by the radiotherapy.  She expresses distrust of the medical system and is unsure if she wants to pursue radiotherapy for her current disease.   PREVIOUS RADIATION THERAPY: Yes - for left breast DCIS in 2006, treated by Dr. Tammi Klippel  PAST MEDICAL HISTORY:  has a past medical history of Arthritis, Breast cancer ([place]) (2006), CAD (coronary artery disease) (11/07), Cataract, Chronic lower back pain, Chronic pain, Chronic venous insufficiency (2012), Colonic polyp (1995), Diabetes mellitus type II, Diastolic CHF, chronic ([place]), Essential hypertension, GERD (gastroesophageal reflux disease), Heart murmur, Hyperlipidemia, Iron deficiency anemia, Obesity, OSA (obstructive sleep apnea) (02/16/2007), OSA on CPAP, Personal history of chemotherapy, Personal history of radiation therapy, Refusal of blood transfusions as patient is Jehovah's Witness, and Seasonal allergies.    PAST SURGICAL HISTORY: Past Surgical History:  Procedure Laterality Date  . BREAST LUMPECTOMY Left 2006  . BREAST LUMPECTOMY W/ NEEDLE LOCALIZATION  2006   34 Chemo RX  . CARDIAC CATHETERIZATION  N/A 08/15/2015   Procedure: Left Heart Cath and Coronary Angiography;  Surgeon: Troy Sine, MD;  Location: [facility];  Service: Cardiovascular;  Laterality: N/A;  . COLONOSCOPY W/ BIOPSIES AND POLYPECTOMY  1994, 2000, 2012, 26/018  . IR IMAGING GUIDED PORT INSERTION  04/17/2018  . LEFT HEART CATH AND CORONARY ANGIOGRAPHY N/A 08/19/2017   Procedure: LEFT HEART CATH AND CORONARY ANGIOGRAPHY;  Surgeon: Belva Crome, MD;  Location: [facility];  Service: Cardiovascular;  Laterality: N/A;  . MASTECTOMY MODIFIED RADICAL Bilateral 07/20/2018  . MASTECTOMY MODIFIED RADICAL Bilateral 07/20/2018   Procedure: BILATERAL MODIFIED RADICAL MASTECTOMIES;  Surgeon: Jovita Kussmaul, MD;  Location: [facility];  Service: General;  Laterality: Bilateral;  . PORTACATH PLACEMENT N/A 04/14/2018   Procedure: ATTEMPED INSERTION PORT-A-CATH;  Surgeon: Jovita Kussmaul, MD;  Location: [facility];  Service: General;  Laterality: N/A;  . TONSILLECTOMY AND ADENOIDECTOMY  1964  . TOTAL ABDOMINAL HYSTERECTOMY  1985   "I had endometrosis"  . ULTRASOUND GUIDANCE FOR VASCULAR ACCESS  08/19/2017   Procedure: Ultrasound Guidance For Vascular Access;  Surgeon: Belva Crome, MD;  Location: [facility];  Service: Cardiovascular;;    FAMILY HISTORY: family history includes Breast cancer in her sister; Depression (age of onset: 4) in her daughter; Diabetes in her father; Heart attack (age of onset: 80) in her brother; Kidney failure in her brother; Lung cancer in her mother; Osteoarthritis in her maternal grandmother; Pulmonary embolism in her brother.  SOCIAL HISTORY:  reports that she has never smoked. She has never used smokeless tobacco. She reports that she does not drink alcohol or use drugs.  ALLERGIES: Lisinopril; Oxycodone-acetaminophen;  Percocet [oxycodone-acetaminophen]; Victoza [liraglutide]; and Atorvastatin  MEDICATIONS:  Current Outpatient Medications  Medication Sig Dispense Refill  . ACCU-CHEK  FASTCLIX LANCETS MISC Check blood sugar 3 times a day 102 each 5  . ACCU-CHEK GUIDE test strip USE 1  THREE TIMES DAILY 375 each 1  . amLODipine (NORVASC) 10 MG tablet TAKE ONE (1) TABLET BY MOUTH EVERY DAY (Patient taking differently: Take 10 mg by mouth daily. ) 90 tablet 3  . aspirin EC 81 MG tablet Take 81 mg by mouth daily.    . Coenzyme Q10 (CO Q 10) 100 MG CAPS Take 100 mg by mouth daily.     . furosemide (LASIX) 20 MG tablet TAKE 1 TABLET (20 MG TOTAL) BY MOUTH AS NEEDED FOR EDEMA. (Patient taking differently: Take 20 mg by mouth daily as needed for edema. ) 30 tablet 2  . gabapentin (NEURONTIN) 300 MG capsule Take 1 capsule (300 mg total) by mouth at bedtime. 30 capsule 2  . insulin aspart (NOVOLOG FLEXPEN) 100 UNIT/ML FlexPen If 150-199 inject 2 units,200-250 inject 3 units,251- 350 inject 4 units,351-400 inject 5 units, call office. 15 mL 1  . insulin aspart protamine- aspart (NOVOLOG MIX 70/30) (70-30) 100 UNIT/ML injection Inject 2-5 Units into the skin See admin instructions. Inject per sliding scale 201-250 = 2 units, 251-300 = 3 units, 301-400 = 4 units, >401 = 5 units and call MD SQ 3 times daily.    . Insulin Glargine (TOUJEO SOLOSTAR) 300 UNIT/ML SOPN Inject 30 Units into the skin daily. 12 mL 3  . Insulin Pen Needle 32G X 4 MM MISC USE FOR INJECTIONS up to 4 times daily. Diagnosis code E11.40, Z79.4 200 each 5  . lidocaine-prilocaine (EMLA) cream Apply to affected area once 30 g 3  . loratadine (CLARITIN) 10 MG tablet Take 10 mg by mouth daily as needed for allergies.    Marland Kitchen losartan (COZAAR) 50 MG tablet TAKE ONE (1) TABLET BY MOUTH EVERY DAY (Patient taking differently: Take 50 mg by mouth daily. ) 90 tablet 3  . meloxicam (MOBIC) 7.5 MG tablet Take 1 tablet (7.5 mg total) by mouth daily as needed for pain. 90 tablet 0  . metFORMIN (GLUCOPHAGE) 1000 MG tablet TAKE ONE (1) TABLET BY MOUTH TWO (2) TIMES DAILY WITH A MEAL (Patient taking differently: Take 1,000 mg by mouth 2 (two)  times daily with a meal. ) 180 tablet 3  . methocarbamol (ROBAXIN) 750 MG tablet Take 1 tablet (750 mg total) by mouth every 6 (six) hours as needed for muscle spasms. 30 tablet 1  . metoprolol succinate (TOPROL-XL) 100 MG 24 hr tablet TAKE ONE (1) TABLET BY MOUTH EVERY DAY (Patient taking differently: Take 100 mg by mouth daily. ) 90 tablet 3  . Olopatadine HCl (PATADAY) 0.2 % SOLN Place 1 drop into both eyes daily as needed (for allergies).     Marland Kitchen omeprazole (PRILOSEC) 40 MG capsule Take 1 capsule (40 mg total) by mouth daily. (Patient taking differently: Take 40 mg by mouth daily as needed (for acid reflux). ) 30 capsule 5  . spironolactone (ALDACTONE) 25 MG tablet Take 1 tablet (25 mg total) by mouth daily. 90 tablet 3  . traMADol (ULTRAM) 50 MG tablet Take 1 tablet (50 mg total) by mouth every 12 (twelve) hours as needed. (Patient taking differently: Take 50 mg by mouth every 12 (twelve) hours as needed for moderate pain. ) 30 tablet 0  . vitamin B-12 (CYANOCOBALAMIN) 1000 MCG tablet Take 1,000 mcg  by mouth daily.    . capecitabine (XELODA) 500 MG tablet Take 2 tablets (1,000 mg total) by mouth 2 (two) times daily after a meal. Take on radiation days only, M-F (Patient not taking: Reported on 08/02/2018) 132 tablet 0  . chlorpheniramine-HYDROcodone (TUSSIONEX PENNKINETIC ER) 10-8 MG/5ML SUER Take 5 mLs by mouth 2 (two) times daily. (Patient not taking: Reported on 08/02/2018) 140 mL 0  . dexamethasone (DECADRON) 4 MG tablet Take 1 tablet (4 mg total) by mouth daily. Take 1 tablet day before chemo and 1 tablet day after chemo (Patient not taking: Reported on 07/07/2018) 10 tablet 0  . ezetimibe (ZETIA) 10 MG tablet TAKE ONE (1) TABLET BY MOUTH EVERY DAY (Patient not taking: No sig reported) 30 tablet 7  . isosorbide mononitrate (IMDUR) 120 MG 24 hr tablet Take 2 tablets (240 mg total) by mouth every morning. (Patient taking differently: Take 120 mg by mouth 2 (two) times daily. ) 30 tablet 11  .  nitroGLYCERIN (NITRODUR - DOSED IN MG/24 HR) 0.4 mg/hr patch APPLY ONE PATCH ON SKIN EVERY DAY. LEAVE ON FOR 12 HOURS AND OFF FOR 12 HOURS. (Patient not taking: No sig reported) 100 patch 3  . nitroGLYCERIN (NITROSTAT) 0.4 MG SL tablet PLACE 1 TABLET UNDER THE TONGUE EVERY 5 MINUTES AS NEEDED FOR CHEST PAIN (Patient not taking: Reported on 09/12/2018) 25 tablet 7  . ondansetron (ZOFRAN) 8 MG tablet Take 1 tablet (8 mg total) by mouth 2 (two) times daily as needed for refractory nausea / vomiting. Start on day 3 after chemo. (Patient not taking: Reported on 07/07/2018) 30 tablet 1  . prochlorperazine (COMPAZINE) 10 MG tablet Take 1 tablet (10 mg total) by mouth every 6 (six) hours as needed (Nausea or vomiting). (Patient not taking: Reported on 08/02/2018) 60 tablet 2   No current facility-administered medications for this encounter.     REVIEW OF SYSTEMS: A 10+ POINT REVIEW OF SYSTEMS WAS OBTAINED including neurology, dermatology, psychiatry, cardiac, respiratory, lymph, extremities, GI, GU, Musculoskeletal, constitutional, breasts, reproductive, HEENT.  All pertinent positives are noted in the HPI.  All others are negative.   PHYSICAL EXAM:  height is 5' 1" (1.549 m) and weight is 215 lb 12.8 oz (97.9 kg). Her oral temperature is 98.4 F (36.9 C). Her blood pressure is 171/71 (abnormal) and her pulse is 80. Her oxygen saturation is 100%.   General: Alert and oriented, in no acute distress. HEENT: Head is normocephalic. Extraocular movements are intact. Oropharynx is clear. Neck: Neck is supple, no palpable cervical or supraclavicular lymphadenopathy. Heart: Regular in rate and rhythm with no murmurs, rubs, or gallops. Chest: Clear to auscultation bilaterally, with no rhonchi, wheezes, or rales. Abdomen: Soft, nontender, nondistended, with no rigidity or guarding. Extremities: Edema in ankles bilaterally. Lymphedema in arms, more-so on the left. Lymphatics: see Neck Exam Skin: Changes in  fingernails from prior chemotherapy. No concerning lesions. Musculoskeletal: Decent ROM in bilateral shoulders. Symmetric Strength and muscle tone throughout. Neurologic: Cranial nerves II through XII are grossly intact. No obvious focalities. Speech is fluent. Coordination is intact. Psychiatric: Judgment and insight are intact. Affect is appropriate. Chest Wall: Left mastectomy scar is healed well. No sign of recurrence on the left. Right mastectomy scar is healed well. No sign of recurrence on the right. There is residual soft tissue hanging from the lateral aspects of her chest bilaterally.   ECOG = 1 0 - Asymptomatic (Fully active, able to carry on all predisease activities without restriction)  1 -  Symptomatic but completely ambulatory (Restricted in physically strenuous activity but ambulatory and able to carry out work of a light or sedentary nature. For example, light housework, office work)  2 - Symptomatic, <50% in bed during the day (Ambulatory and capable of all self care but unable to carry out any work activities. Up and about more than 50% of waking hours)  3 - Symptomatic, >50% in bed, but not bedbound (Capable of only limited self-care, confined to bed or chair 50% or more of waking hours)  4 - Bedbound (Completely disabled. Cannot carry on any self-care. Totally confined to bed or chair)  5 - Death   Eustace Pen MM, Creech RH, Tormey DC, et al. [phone]). "Toxicity and response criteria of the [facility]". [place] Oncol. 5 (6): 649-55   LABORATORY DATA:  Lab Results  Component Value Date   WBC 6.7 09/07/2018   HGB 10.3 (L) 09/07/2018   HCT 33.1 (L) 09/07/2018   MCV 90.2 09/07/2018   PLT 215 09/07/2018   CMP     Component Value Date/Time   NA 142 09/07/2018 1423   NA 145 (H) 06/23/2018 1025   K 3.9 09/07/2018 1423   CL 107 09/07/2018 1423   CO2 25 09/07/2018 1423   GLUCOSE 138 (H) 09/07/2018 1423   BUN 15 09/07/2018 1423   BUN 16  06/23/2018 1025   CREATININE 0.85 09/07/2018 1423   CREATININE 0.80 10/17/2014 1106   CALCIUM 9.8 09/07/2018 1423   PROT 7.5 09/07/2018 1423   PROT 6.7 06/13/2018 1117   ALBUMIN 3.4 (L) 09/07/2018 1423   ALBUMIN 4.1 06/13/2018 1117   AST 14 (L) 09/07/2018 1423   ALT 13 09/07/2018 1423   ALKPHOS 74 09/07/2018 1423   BILITOT <0.2 (L) 09/07/2018 1423   GFRNONAA >60 09/07/2018 1423   GFRNONAA 79 10/17/2014 1106   GFRAA >60 09/07/2018 1423   GFRAA >89 10/17/2014 1106         RADIOGRAPHY:  As above    IMPRESSION/PLAN:   Breast Cancer   I reviewed the patient's old radiation therapy plan with her to show how her heart was blocked from previous radiation.  I also called Dr. Lindi Adie to discuss her case as the patient had the impression from Dr. Lindi Adie that she was not going to receive a recommendation of re-treatment with radiotherapy to the left chest wall  She has a very high risk circumstance.  Unfortunately she presented with locally advanced (potentially inflammatory) breast cancer on the left in a previously irradiated region.  This is lymph node positive.  The highest burden of lymph node disease is on the right side although no disease was found in her breast tissue.  Based on the locally advanced nature of the left breast tumor and the locally advanced nature of the right axillary disease she is at high risk for recurrences bilaterally.  Therefore, I believe that the risks of radiotherapy on both sides of her chest are outweighed by the significant benefits for local regional control.  We had a very lengthy discussion about the risks benefits and side effects of radiotherapy to the lymph nodes and chest wall bilaterally.  We discussed the data by Lester Kinsman al which demonstrated that reirradiation of the chest wall for high risk breast cancer patients is well tolerated and efficacious.  I did however acknowledge that there are risks to treatment which include but are not necessarily limited  to skin irritation, fatigue, lymphedema, nonhealing wounds, rib fracture, rare  internal organ injury including to the lungs and heart.  I also called the patient to let her know after consultation that I had reached Dr. Lindi Adie to discuss her case.  Although he had told her that there was a chance that radiotherapy would not be recommended on the left side due to prior treatment, based on our discussion and acknowledgment of how high risk her cancer is, he is in agreement with bilateral radiotherapy to her chest wall and lymph nodes  The patient would like to talk to Tice further about her case before she makes a final decision.  I will schedule a simulation next week and another follow-up with her so that all of her questions are answered and she feels comfortable with the plan moving forward  We discussed that radiation would take approximately 6 weeks to complete.   This is a very difficult circumstance.  She has aggressive bilateral disease, poor response to chemotherapy, and other comorbidities.  She is not enthusiastic about enduring additional side effects but at the same time she seems to acknowledge that a recurrence of her cancer could be devastating.  I will continue to work with her and treat her as aggressively as she is willing, while keeping in touch with her values and goals.   I spent 60 minutes  face to face with the patient and more than 50% of that time was spent in counseling and/or coordination of care.   __________________________________________   Eppie Gibson, MD  This document serves as a record of services personally performed by Eppie Gibson, MD. It was created on her behalf by Rae Lips, a trained medical scribe. The creation of this record is based on the scribe's personal observations and the provider's statements to them. This document has been checked and approved by the attending provider.

## 2018-09-12 NOTE — Therapy (Signed)
[place], [place], [address] Phone: [phone]   Fax:  [phone]  Physical Therapy Treatment  Patient Details  Name: [name] MRN: [id number] Date of Birth: June 07, 1951 Referring Provider (PT): Dr. Marlou Starks    Encounter Date: 09/12/2018  PT End of Session - 09/12/18 1223    Visit Number  3    Number of Visits  25    Date for PT Re-Evaluation  11/06/17    PT Start Time  1021    PT Stop Time  1103    PT Time Calculation (min)  42 min    Activity Tolerance  Patient tolerated treatment well    Behavior During Therapy  [facility] for tasks assessed/performed       Past Medical History:  Diagnosis Date  . Arthritis    "knees, lower back" (08/18/2017)  . 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 S/p lumpectomy and radiation.  Marland Kitchen CAD (coronary artery disease) 11/07   cath 11/11 :  3V CADsee report   . Cataract    small both eyes  . Chronic lower back pain   . Chronic pain    MR 08/07/10 :  Spondylosis L4-5 with B foraminal narrowing and L4 nerve root enchroachment B.  . Chronic venous insufficiency 2012   Has had full W/U incl ECHO, LFT's, Cr, Umicro, TSH, BNP. Symptomatic treatment.  . Colonic polyp 1995   Colonoscopies 1994, 2000, and 02/02/2011. Last had 9 polyps - Tubular adenoma and tubulovillous was the pathology results without high grade dysplasia  . Diabetes mellitus type II    Insulin dependent. Has worked with [name] R previously.  . Diastolic CHF, chronic ([place])    NL EF by echo 01/2012, Gr I dd  . Essential hypertension    Requires 5 drug therapy and still difficult to control.  Marland Kitchen GERD (gastroesophageal reflux disease)   . Heart murmur   . Hyperlipidemia    On daily statin  . Iron deficiency anemia    Ferritin was 12 in 2012. on FeSo4. Has had colon and EGD 02/02/11 that showed mild gastritis and colon polyps.  . Obesity    Morbid. HAs  worked with [name] T previously.  . OSA (obstructive sleep apnea) 02/16/2007   02/2007 : Moderate AHI 35.6, O2 sat decreased to 65%, CPAP titration to 16 with AHI of 3.6. 02/2014 : Rare respiratory events with sleep disturbance, within normal limits. AHI 0.4 per hour      . OSA on CPAP   . Personal history of chemotherapy   . Personal history of radiation therapy    "to my left breast"  . Refusal of blood transfusions as patient is Jehovah's Witness   . Seasonal allergies    "grass pollen"    Past Surgical History:  Procedure Laterality Date  . BREAST LUMPECTOMY Left 2006  . BREAST LUMPECTOMY W/ NEEDLE LOCALIZATION  2006   34 Chemo RX  . CARDIAC CATHETERIZATION N/A 08/15/2015   Procedure: Left Heart Cath and Coronary Angiography;  Surgeon: Troy Sine, MD;  Location: [facility];  Service: Cardiovascular;  Laterality: N/A;  . COLONOSCOPY W/ BIOPSIES AND POLYPECTOMY  1994, 2000, 2012, 26/018  . IR IMAGING GUIDED PORT INSERTION  04/17/2018  . LEFT HEART CATH AND CORONARY ANGIOGRAPHY N/A 08/19/2017   Procedure: LEFT HEART CATH AND CORONARY ANGIOGRAPHY;  Surgeon: Belva Crome, MD;  Location: [facility];  Service: Cardiovascular;  Laterality: N/A;  . MASTECTOMY MODIFIED RADICAL Bilateral 07/20/2018  . MASTECTOMY MODIFIED RADICAL Bilateral 07/20/2018   Procedure: BILATERAL MODIFIED RADICAL MASTECTOMIES;  Surgeon: Jovita Kussmaul, MD;  Location: [facility];  Service: General;  Laterality: Bilateral;  . PORTACATH PLACEMENT N/A 04/14/2018   Procedure: ATTEMPED INSERTION PORT-A-CATH;  Surgeon: Jovita Kussmaul, MD;  [facility];  Service: General;  Laterality: N/A;  . TONSILLECTOMY AND ADENOIDECTOMY  1964  . TOTAL ABDOMINAL HYSTERECTOMY  1985   "I had endometrosis"  . ULTRASOUND GUIDANCE FOR VASCULAR ACCESS  08/19/2017   Procedure: Ultrasound Guidance For Vascular Access;  Surgeon: Belva Crome, MD;  Location: [facility];  Service: Cardiovascular;;    There were no  vitals filed for this visit.  Subjective Assessment - 09/12/18 1025    Subjective  I did the stretches she gave me yesterday 2x and put my velcro chest band back on and I can already tell an improvement. I didn't heave the tightness on my chest causing SOB last night either.     Pertinent History   Pt had breast cancer diagnosed in May 2019 with 2 blisters popped up on left breast that got worse in June, She was diagnosed with left inflammatory breast cancer and had right axillay node involvment also.  Pt has bilateral mastectomy on October 17 with 1/2 nodes positive on left and 7/12/ nodes positive on right  She underwent chemotherapy from July - Sept and had some problems with fatigue and tingling in hands and fingertips She was not able to get the full does of chemo due to CHF.  The surgery was a success and she plans to start radiation to the right side on Dec. 10 Past history includes lumpectomy and radiation to left in 2006, CHF. lower back pain that causes her to walk with a cane  Past history also includes diabetes and high blood pressure, pt had drains removed from mastectomy on 09/05/18    Patient Stated Goals  to be able to get my limbs back in circulation and get rid of this fluid    Currently in Pain?  No/denies                       [facility] Adult PT Treatment/Exercise - 09/12/18 0001      Manual Therapy   Manual Therapy  Myofascial release;Passive ROM    Myofascial Release  To bil axillae and anterior shoulder at tight tissue    Passive ROM  In Supine to bil shoulders into flexion, abduction and D2 slowly and to pts tolerance. She required VCs throughout to relax                PT Short Term Goals - 08/02/18 2012      PT SHORT TERM GOAL #1   Title  Pt will report feelings of fullness in shoulders and trunk are decreased by at least 50% with manual lymph draiange and compression    Time  3    Period  Weeks    Status  New        PT Long Term Goals -  09/11/18 1217      PT LONG TERM GOAL #1   Title  Pt will have at least 125 degrees of both shoulder abduction so that she can receive radiation and get her shirt on  by herself    Time  8    Period  Weeks  Status  New    Target Date  11/06/18      PT LONG TERM GOAL #2   Title  Pt reports she has enough left and right shoulder strength so that she can get her dishes to the bottom shelf of the kitchen cabinet to prepare meals at home     Time  8    Period  Weeks    Status  New    Target Date  11/06/18      PT LONG TERM GOAL #3   Title  Pt will verbalize she has an understanding of lymphedema risk reduction practices    Time  8    Period  Weeks    Status  New    Target Date  11/06/18      PT LONG TERM GOAL #4   Title  Pt will obtain appropriate compression garments for long term managment of lymphedema of BUE and chest.    Time  8    Period  Weeks    Status  New    Target Date  11/06/18      PT LONG TERM GOAL #5   Title  Pt will report a 70% improvement in feelings of tightness and discomfort across chest to allow improved comfort.     Time  8    Period  Weeks    Status  New    Target Date  11/06/18            Plan - 09/12/18 1224    Clinical Impression Statement  Pt tolerated first session of stretching well though did require multiple VCs throughout to relax during stretching. She was able to do this after cuing, just had very guarded, protective posture. By end of session pt reported feeling mch better and tightness much improved. Encouraged her to cont new HEP issued yesterday at evaluation and to be mindful of abnormal posturing throughout day and working towards decreasing this. Pt verbalized understanding.    Rehab Potential  Good    Clinical Impairments Affecting Rehab Potential  pt will be starting radiation    PT Frequency  3x / week    PT Duration  8 weeks    PT Treatment/Interventions  ADLs/Self Care Home Management;Therapeutic activities;Patient/family  education;Manual lymph drainage;DME Instruction;Neuromuscular re-education;Orthotic Fit/Training;Therapeutic exercise;Manual techniques;Passive range of motion;Taping;Compression bandaging;Scar mobilization    PT Next Visit Plan  MLD to bilateral chest, trunk, and UEs, cont PROM to bilateral shoulders, maybe bandage around chest, coordinate with [employer] for velcro garments    Consulted and Agree with Plan of Care  Patient       Patient will benefit from skilled therapeutic intervention in order to improve the following deficits and impairments:  Decreased skin integrity, Decreased knowledge of use of DME, Increased fascial restricitons, Pain, Postural dysfunction, Decreased scar mobility, Decreased range of motion, Decreased strength, Impaired UE functional use, Obesity, Increased edema, Decreased knowledge of precautions  Visit Diagnosis: Postmastectomy lymphedema  Stiffness of right shoulder, not elsewhere classified  Stiffness of left shoulder joint  Muscle weakness (generalized)  Aftercare following surgery for neoplasm     Problem List Patient Active Problem List   Diagnosis Date Noted  . Cancer of left female breast  ([place]) 07/20/2018  . Bilateral lower extremity edema 06/15/2018  . 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 Shortness of breath on exertion 06/15/2018  . Chronic diastolic heart failure ([place]) 06/15/2018  . Port-A-Cath in place 04/25/2018  . Hypertensive retinopathy 09/09/2017  . Elevated TSH 08/15/2015  .  Prolonged Q-T interval on ECG 08/14/2015  . Diabetic retinopathy ([place]) 03/26/2015  . Abnormality of gait 01/06/2013  . CTS (carpal tunnel syndrome) 03/09/2012  . Urinary frequency 07/14/2011  . Chronic venous insufficiency   . Routine health maintenance 03/09/2011  . CAD (coronary artery disease)   . Benign essential HTN   . Hyperlipidemia   . Colonic polyp   . Morbid obesity ([place])   . Chronic pain   . Malignant neoplasm of lower-inner quadrant of  left breast in female, estrogen receptor positive ([place])   . Iron deficiency anemia   . Seasonal allergies   . OSA (obstructive sleep apnea) 02/16/2007  . Type II diabetes mellitus 10/04/2004    Otelia Limes, PTA 09/12/2018, 12:29 PM  [place], [place], [address] Phone: [phone]   Fax:  [phone]  Name: [name] MRN: [id number] Date of Birth: 12/05/1950

## 2018-09-13 ENCOUNTER — Encounter: Payer: Self-pay | Admitting: General Practice

## 2018-09-13 NOTE — Telephone Encounter (Signed)
Oral Oncology Pharmacist Encounter  Oral oncology patient advocate spoke with patient today to coordinate medication acquisition of the Xeloda. Patient will pick up 1st 30-calendar day supply of Xeloda (22 treatment days) from the [facility] tomorrow (09/14/18) for copayment $0. Insurance will only allow a 30-calendar day supply to be dispensed at a time. Pharmacy will contact patient 1 week prior to needing remaining tablets to coordinate acquisition of remainder of Xeloda.  Patient with a few additional questions about Xeloda administration and possible side effects.  In brief: Patient will take Xeloda 500mg  tablets, 2 tablets (1000mg ) by mouth in AM and 2 tabs (1000mg ) by mouth in PM, within 30 minutes of finishing meals, on days of radiation only She understands 1st dose of Xeloda will be in the morning on her 1st day of radiation and last dose of Xeloda is in the evening on her last day of radiation. Possible side effects include but are not limited to an increase in watery or loose stools and skin changes on the hands or feet.  All questions answered. She expressed understanding and appreciation. She knows to call the office with additional questions or concerns.  Johny Drilling, PharmD, BCPS, BCOP  09/13/2018 11:28 AM Oral Oncology Clinic [phone]

## 2018-09-13 NOTE — Progress Notes (Signed)
[place] Psychosocial Distress Screening Clinical Social Work  Clinical Social Work was referred by distress screening protocol.  The patient scored a 5 on the Psychosocial Distress Thermometer which indicates moderate distress. Clinical Social Worker contacted patient by phone to assess for distress and other psychosocial needs. Unable to reach on either home or mobile phone, unable to leave message.  As concerns are primarily physical in nature, CSW will defer to medical team.    [facility] DISTRESS SCREENING 09/12/2018  Screening Type Initial Screening  Distress experienced in past week (1-10) 5  Physical Problem type Pain;Sleep/insomnia;Skin dry/itchy;Swollen arms/legs    Clinical Social Worker follow up needed: No.  If yes, follow up plan:  Beverely Pace, [place], LCSW Clinical Social Worker Phone:  [phone]

## 2018-09-14 ENCOUNTER — Ambulatory Visit: Payer: Medicare Other | Admitting: Physical Therapy

## 2018-09-14 ENCOUNTER — Encounter: Payer: Self-pay | Admitting: Physical Therapy

## 2018-09-14 ENCOUNTER — Other Ambulatory Visit: Payer: Self-pay

## 2018-09-14 DIAGNOSIS — M25611 Stiffness of right shoulder, not elsewhere classified: Secondary | ICD-10-CM

## 2018-09-14 DIAGNOSIS — I972 Postmastectomy lymphedema syndrome: Secondary | ICD-10-CM

## 2018-09-14 DIAGNOSIS — M25612 Stiffness of left shoulder, not elsewhere classified: Secondary | ICD-10-CM | POA: Diagnosis not present

## 2018-09-14 DIAGNOSIS — Z483 Aftercare following surgery for neoplasm: Secondary | ICD-10-CM | POA: Diagnosis not present

## 2018-09-14 DIAGNOSIS — M6281 Muscle weakness (generalized): Secondary | ICD-10-CM | POA: Diagnosis not present

## 2018-09-14 MED FILL — CAPECITABINE 500 MG TABS: 500 | 30 days supply | Qty: 88 | Fill #0

## 2018-09-14 NOTE — Therapy (Signed)
[place], [place], [address] Phone: [phone]   Fax:  [phone]  Physical Therapy Treatment  Patient Details  Name: [name] MRN: [id number] Date of Birth: 28-May-1951 Referring Provider (PT): Dr. Marlou Starks    Encounter Date: 09/14/2018  PT End of Session - 09/14/18 1221    Visit Number  4    Number of Visits  25    Date for PT Re-Evaluation  11/06/17    PT Start Time  1022    PT Stop Time  1100    PT Time Calculation (min)  38 min    Activity Tolerance  Patient tolerated treatment well    Behavior During Therapy  [facility] for tasks assessed/performed       Past Medical History:  Diagnosis Date  . Arthritis    "knees, lower back" (08/18/2017)  . 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 S/p lumpectomy and radiation.  Marland Kitchen CAD (coronary artery disease) 11/07   cath 11/11 :  3V CADsee report   . Cataract    small both eyes  . Chronic lower back pain   . Chronic pain    MR 08/07/10 :  Spondylosis L4-5 with B foraminal narrowing and L4 nerve root enchroachment B.  . Chronic venous insufficiency 2012   Has had full W/U incl ECHO, LFT's, Cr, Umicro, TSH, BNP. Symptomatic treatment.  . Colonic polyp 1995   Colonoscopies 1994, 2000, and 02/02/2011. Last had 9 polyps - Tubular adenoma and tubulovillous was the pathology results without high grade dysplasia  . Diabetes mellitus type II    Insulin dependent. Has worked with [name] R previously.  . Diastolic CHF, chronic ([place])    NL EF by echo 01/2012, Gr I dd  . Essential hypertension    Requires 5 drug therapy and still difficult to control.  Marland Kitchen GERD (gastroesophageal reflux disease)   . Heart murmur   . Hyperlipidemia    On daily statin  . Iron deficiency anemia    Ferritin was 12 in 2012. on FeSo4. Has had colon and EGD 02/02/11 that showed mild gastritis and colon polyps.  . Obesity    Morbid. HAs  worked with [name] T previously.  . OSA (obstructive sleep apnea) 02/16/2007   02/2007 : Moderate AHI 35.6, O2 sat decreased to 65%, CPAP titration to 16 with AHI of 3.6. 02/2014 : Rare respiratory events with sleep disturbance, within normal limits. AHI 0.4 per hour      . OSA on CPAP   . Personal history of chemotherapy   . Personal history of radiation therapy    "to my left breast"  . Refusal of blood transfusions as patient is Jehovah's Witness   . Seasonal allergies    "grass pollen"    Past Surgical History:  Procedure Laterality Date  . BREAST LUMPECTOMY Left 2006  . BREAST LUMPECTOMY W/ NEEDLE LOCALIZATION  2006   34 Chemo RX  . CARDIAC CATHETERIZATION N/A 08/15/2015   Procedure: Left Heart Cath and Coronary Angiography;  Surgeon: Troy Sine, MD;  Location: [facility];  Service: Cardiovascular;  Laterality: N/A;  . COLONOSCOPY W/ BIOPSIES AND POLYPECTOMY  1994, 2000, 2012, 26/018  . IR IMAGING GUIDED PORT INSERTION  04/17/2018  . LEFT HEART CATH AND CORONARY ANGIOGRAPHY N/A 08/19/2017   Procedure: LEFT HEART CATH AND CORONARY ANGIOGRAPHY;  Surgeon: Belva Crome, MD;  Location: [facility];  Service: Cardiovascular;  Laterality: N/A;  . MASTECTOMY MODIFIED RADICAL Bilateral 07/20/2018  . MASTECTOMY MODIFIED RADICAL Bilateral 07/20/2018   Procedure: BILATERAL MODIFIED RADICAL MASTECTOMIES;  Surgeon: Jovita Kussmaul, MD;  Location: [facility];  Service: General;  Laterality: Bilateral;  . PORTACATH PLACEMENT N/A 04/14/2018   Procedure: ATTEMPED INSERTION PORT-A-CATH;  Surgeon: Jovita Kussmaul, MD;  [facility];  Service: General;  Laterality: N/A;  . TONSILLECTOMY AND ADENOIDECTOMY  1964  . TOTAL ABDOMINAL HYSTERECTOMY  1985   "I had endometrosis"  . ULTRASOUND GUIDANCE FOR VASCULAR ACCESS  08/19/2017   Procedure: Ultrasound Guidance For Vascular Access;  Surgeon: Belva Crome, MD;  Location: [facility];  Service: Cardiovascular;;    There were no  vitals filed for this visit.  Subjective Assessment - 09/14/18 1221    Subjective  My swelling went down some after she stretched me.    Pertinent History   Pt had breast cancer diagnosed in May 2019 with 2 blisters popped up on left breast that got worse in June, She was diagnosed with left inflammatory breast cancer and had right axillay node involvment also.  Pt has bilateral mastectomy on October 17 with 1/2 nodes positive on left and 7/12/ nodes positive on right  She underwent chemotherapy from July - Sept and had some problems with fatigue and tingling in hands and fingertips She was not able to get the full does of chemo due to CHF.  The surgery was a success and she plans to start radiation to the right side on Dec. 10 Past history includes lumpectomy and radiation to left in 2006, CHF. lower back pain that causes her to walk with a cane  Past history also includes diabetes and high blood pressure, pt had drains removed from mastectomy on 09/05/18    Patient Stated Goals  to be able to get my limbs back in circulation and get rid of this fluid    Currently in Pain?  No/denies    Pain Score  0-No pain                       [facility] Adult PT Treatment/Exercise - 09/14/18 0001      Manual Therapy   Manual Therapy  Manual Lymphatic Drainage (MLD);Passive ROM;Edema management    Edema Management  created foam chip pack for pt to wear in binder or in camisole for extra compression at right lateral trunk     Manual Lymphatic Drainage (MLD)  short neck, superficial and deep abdominals, right inguinal nodes and establishment of axillo inguinal pathway, right chest and lateral trunk moving fluid towards pathway then to L sidelying working more on right lateral trunk moving towards pathways, finished with pt in supine re tracing all steps    Passive ROM  in supine to R UE in direction of abuction               PT Short Term Goals - 08/02/18 2012      PT SHORT TERM GOAL #1    Title  Pt will report feelings of fullness in shoulders and trunk are decreased by at least 50% with manual lymph draiange and compression    Time  3    Period  Weeks    Status  New        PT Long Term Goals - 09/11/18 1217      PT LONG TERM GOAL #1   Title  Pt will have at least  125 degrees of both shoulder abduction so that she can receive radiation and get her shirt on  by herself    Time  8    Period  Weeks    Status  New    Target Date  11/06/18      PT LONG TERM GOAL #2   Title  Pt reports she has enough left and right shoulder strength so that she can get her dishes to the bottom shelf of the kitchen cabinet to prepare meals at home     Time  8    Period  Weeks    Status  New    Target Date  11/06/18      PT LONG TERM GOAL #3   Title  Pt will verbalize she has an understanding of lymphedema risk reduction practices    Time  8    Period  Weeks    Status  New    Target Date  11/06/18      PT LONG TERM GOAL #4   Title  Pt will obtain appropriate compression garments for long term managment of lymphedema of BUE and chest.    Time  8    Period  Weeks    Status  New    Target Date  11/06/18      PT LONG TERM GOAL #5   Title  Pt will report a 70% improvement in feelings of tightness and discomfort across chest to allow improved comfort.     Time  8    Period  Weeks    Status  New    Target Date  11/06/18            Plan - 09/14/18 1222    Clinical Impression Statement  Focused today on MLD to right lateral trunk which was more full today. Her left lateral trunk swelling had decreased since time of evaluation. Also continued with PROM to R shoulder. Created a foam chip pack for pt to wear in her camisole or binder to add extra compression to R lateral trunk and pt reports relief with this.     Rehab Potential  Good    Clinical Impairments Affecting Rehab Potential  pt will be starting radiation    PT Frequency  3x / week    PT Duration  8 weeks    PT  Treatment/Interventions  ADLs/Self Care Home Management;Therapeutic activities;Patient/family education;Manual lymph drainage;DME Instruction;Neuromuscular re-education;Orthotic Fit/Training;Therapeutic exercise;Manual techniques;Passive range of motion;Taping;Compression bandaging;Scar mobilization    PT Next Visit Plan  MLD to bilateral chest, trunk, and UEs, cont PROM to bilateral shoulders, maybe bandage around chest, coordinate with [employer] for velcro garments    PT Home Exercise Plan  post op breast    Consulted and Agree with Plan of Care  Patient       Patient will benefit from skilled therapeutic intervention in order to improve the following deficits and impairments:  Decreased skin integrity, Decreased knowledge of use of DME, Increased fascial restricitons, Pain, Postural dysfunction, Decreased scar mobility, Decreased range of motion, Decreased strength, Impaired UE functional use, Obesity, Increased edema, Decreased knowledge of precautions  Visit Diagnosis: Postmastectomy lymphedema  Stiffness of right shoulder, not elsewhere classified     Problem List Patient Active Problem List   Diagnosis Date Noted  . Cancer of left female breast  ([place]) 07/20/2018  . Bilateral lower extremity edema 06/15/2018  . 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 Shortness of breath on exertion 06/15/2018  . Chronic diastolic heart failure ([place])  06/15/2018  . Port-A-Cath in place 04/25/2018  . Hypertensive retinopathy 09/09/2017  . Elevated TSH 08/15/2015  . Prolonged Q-T interval on ECG 08/14/2015  . Diabetic retinopathy ([place]) 03/26/2015  . Abnormality of gait 01/06/2013  . CTS (carpal tunnel syndrome) 03/09/2012  . Urinary frequency 07/14/2011  . Chronic venous insufficiency   . Routine health maintenance 03/09/2011  . CAD (coronary artery disease)   . Benign essential HTN   . Hyperlipidemia   . Colonic polyp   . Morbid obesity ([place])   . Chronic pain   . Malignant neoplasm of  lower-inner quadrant of left breast in female, estrogen receptor positive ([place])   . Iron deficiency anemia   . Seasonal allergies   . OSA (obstructive sleep apnea) 02/16/2007  . Type II diabetes mellitus 10/04/2004    Allyson Sabal [facility] 09/14/2018, 12:24 PM  [place], [place], [address] Phone: [phone]   Fax:  [phone]  Name: [name] MRN: [id number] Date of Birth: Dec 17, 1950  Manus Gunning, PT 09/14/18 12:25 PM

## 2018-09-15 ENCOUNTER — Encounter: Payer: Self-pay | Admitting: Radiation Oncology

## 2018-09-18 ENCOUNTER — Telehealth: Payer: Self-pay | Admitting: Internal Medicine

## 2018-09-18 ENCOUNTER — Encounter: Payer: Self-pay | Admitting: Rehabilitation

## 2018-09-18 ENCOUNTER — Ambulatory Visit: Payer: Medicare Other | Admitting: Rehabilitation

## 2018-09-18 DIAGNOSIS — G4733 Obstructive sleep apnea (adult) (pediatric): Secondary | ICD-10-CM

## 2018-09-18 DIAGNOSIS — M25612 Stiffness of left shoulder, not elsewhere classified: Secondary | ICD-10-CM | POA: Diagnosis not present

## 2018-09-18 DIAGNOSIS — M6281 Muscle weakness (generalized): Secondary | ICD-10-CM | POA: Diagnosis not present

## 2018-09-18 DIAGNOSIS — M25611 Stiffness of right shoulder, not elsewhere classified: Secondary | ICD-10-CM

## 2018-09-18 DIAGNOSIS — I972 Postmastectomy lymphedema syndrome: Secondary | ICD-10-CM | POA: Diagnosis not present

## 2018-09-18 DIAGNOSIS — Z483 Aftercare following surgery for neoplasm: Secondary | ICD-10-CM

## 2018-09-18 NOTE — Telephone Encounter (Signed)
Oral Oncology Patient Advocate Encounter  Confirmed with [place] that Xeloda was picked up on 09/14/18 with a $0 copay   [place] Patient [place] Phone [phone] Fax [phone]

## 2018-09-18 NOTE — Telephone Encounter (Signed)
What supplies are needed

## 2018-09-18 NOTE — Therapy (Signed)
[place], [place], [address] Phone: [phone]   Fax:  [phone]  Physical Therapy Treatment  Patient Details  Name: [name] MRN: [id number] Date of Birth: 1951/06/10 Referring Provider (PT): Dr. Marlou Starks    Encounter Date: 09/18/2018  PT End of Session - 09/18/18 1436    Visit Number  5    Number of Visits  25    Date for PT Re-Evaluation  11/06/17    PT Start Time  5427    PT Stop Time  1515    PT Time Calculation (min)  41 min    Activity Tolerance  Patient tolerated treatment well    Behavior During Therapy  [facility] for tasks assessed/performed       Past Medical History:  Diagnosis Date  . Arthritis    "knees, lower back" (08/18/2017)  . 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 S/p lumpectomy and radiation.  Marland Kitchen CAD (coronary artery disease) 11/07   cath 11/11 :  3V CADsee report   . Cataract    small both eyes  . Chronic lower back pain   . Chronic pain    MR 08/07/10 :  Spondylosis L4-5 with B foraminal narrowing and L4 nerve root enchroachment B.  . Chronic venous insufficiency 2012   Has had full W/U incl ECHO, LFT's, Cr, Umicro, TSH, BNP. Symptomatic treatment.  . Colonic polyp 1995   Colonoscopies 1994, 2000, and 02/02/2011. Last had 9 polyps - Tubular adenoma and tubulovillous was the pathology results without high grade dysplasia  . Diabetes mellitus type II    Insulin dependent. Has worked with [name] R previously.  . Diastolic CHF, chronic ([place])    NL EF by echo 01/2012, Gr I dd  . Essential hypertension    Requires 5 drug therapy and still difficult to control.  Marland Kitchen GERD (gastroesophageal reflux disease)   . Heart murmur   . Hyperlipidemia    On daily statin  . Iron deficiency anemia    Ferritin was 12 in 2012. on FeSo4. Has had colon and EGD 02/02/11 that showed mild gastritis and colon polyps.  . Obesity    Morbid. HAs  worked with [name] T previously.  . OSA (obstructive sleep apnea) 02/16/2007   02/2007 : Moderate AHI 35.6, O2 sat decreased to 65%, CPAP titration to 16 with AHI of 3.6. 02/2014 : Rare respiratory events with sleep disturbance, within normal limits. AHI 0.4 per hour      . OSA on CPAP   . Personal history of chemotherapy   . Personal history of radiation therapy    "to my left breast"  . Refusal of blood transfusions as patient is Jehovah's Witness   . Seasonal allergies    "grass pollen"    Past Surgical History:  Procedure Laterality Date  . BREAST LUMPECTOMY Left 2006  . BREAST LUMPECTOMY W/ NEEDLE LOCALIZATION  2006   34 Chemo RX  . CARDIAC CATHETERIZATION N/A 08/15/2015   Procedure: Left Heart Cath and Coronary Angiography;  Surgeon: Troy Sine, MD;  Location: [place] [facility];  Service: Cardiovascular;  Laterality: N/A;  . COLONOSCOPY W/ BIOPSIES AND POLYPECTOMY  1994, 2000, 2012, 26/018  . IR IMAGING GUIDED PORT INSERTION  04/17/2018  . LEFT HEART CATH AND CORONARY ANGIOGRAPHY N/A 08/19/2017   Procedure: LEFT HEART CATH AND CORONARY ANGIOGRAPHY;  Surgeon: Belva Crome, MD;  Location: [place] [facility];  Service: Cardiovascular;  Laterality: N/A;  . MASTECTOMY MODIFIED RADICAL Bilateral 07/20/2018  . MASTECTOMY MODIFIED RADICAL Bilateral 07/20/2018   Procedure: BILATERAL MODIFIED RADICAL MASTECTOMIES;  Surgeon: Jovita Kussmaul, MD;  Location: [facility];  Service: General;  Laterality: Bilateral;  . PORTACATH PLACEMENT N/A 04/14/2018   Procedure: ATTEMPED INSERTION PORT-A-CATH;  Surgeon: Jovita Kussmaul, MD;  Location: [facility];  Service: General;  Laterality: N/A;  . TONSILLECTOMY AND ADENOIDECTOMY  1964  . TOTAL ABDOMINAL HYSTERECTOMY  1985   "I had endometrosis"  . ULTRASOUND GUIDANCE FOR VASCULAR ACCESS  08/19/2017   Procedure: Ultrasound Guidance For Vascular Access;  Surgeon: Belva Crome, MD;  Location: [place] [facility];  Service: Cardiovascular;;    There were no  vitals filed for this visit.  Subjective Assessment - 09/18/18 1434    Subjective  The arms are ok.  I do my exercises 3x per day.  I should have my simulation for radiation soon.  I just stay swollen     Pertinent History   Pt had breast cancer diagnosed in May 2019 with 2 blisters popped up on left breast that got worse in June, She was diagnosed with left inflammatory breast cancer and had right axillay node involvment also.  Pt has bilateral mastectomy on October 17 with 1/2 nodes positive on left and 7/12/ nodes positive on right  She underwent chemotherapy from July - Sept and had some problems with fatigue and tingling in hands and fingertips She was not able to get the full does of chemo due to CHF.  The surgery was a success and she plans to start radiation to the right side on Dec. 10 Past history includes lumpectomy and radiation to left in 2006, CHF. lower back pain that causes her to walk with a cane  Past history also includes diabetes and high blood pressure, pt had drains removed from mastectomy on 09/05/18    Patient Stated Goals  to be able to get my limbs back in circulation and get rid of this fluid    Currently in Pain?  No/denies                       [facility] Adult PT Treatment/Exercise - 09/18/18 0001      Exercises   Exercises  Shoulder      Shoulder Exercises: Supine   Flexion  AAROM;10 reps;Both    Flexion Limitations  with cane to tolerance      Shoulder Exercises: Seated   Retraction  AROM;Both;10 reps      Manual Therapy   Manual Lymphatic Drainage (MLD)  short neck, superficial and deep abdominals, right inguinal nodes and establishment of axillo inguinal pathway, right chest and lateral trunk moving fluid towards pathway then to L sidelying working more on right lateral trunk moving towards pathways, finished with pt in supine re tracing all steps    Passive ROM  in supine to Rt and Lt UEs to tolerance all directions               PT  Short Term Goals - 08/02/18 2012      PT SHORT TERM GOAL #1   Title  Pt will report feelings of fullness in shoulders and trunk are decreased by at least 50% with manual lymph draiange and compression    Time  3    Period  Weeks    Status  New        PT Long Term Goals -  09/11/18 1217      PT LONG TERM GOAL #1   Title  Pt will have at least 125 degrees of both shoulder abduction so that she can receive radiation and get her shirt on  by herself    Time  8    Period  Weeks    Status  New    Target Date  11/06/18      PT LONG TERM GOAL #2   Title  Pt reports she has enough left and right shoulder strength so that she can get her dishes to the bottom shelf of the kitchen cabinet to prepare meals at home     Time  8    Period  Weeks    Status  New    Target Date  11/06/18      PT LONG TERM GOAL #3   Title  Pt will verbalize she has an understanding of lymphedema risk reduction practices    Time  8    Period  Weeks    Status  New    Target Date  11/06/18      PT LONG TERM GOAL #4   Title  Pt will obtain appropriate compression garments for long term managment of lymphedema of BUE and chest.    Time  8    Period  Weeks    Status  New    Target Date  11/06/18      PT LONG TERM GOAL #5   Title  Pt will report a 70% improvement in feelings of tightness and discomfort across chest to allow improved comfort.     Time  8    Period  Weeks    Status  New    Target Date  11/06/18            Plan - 09/18/18 1437    Clinical Impression Statement  continued MLD with Rt lateral trunk focus and bilateral shoulder ROM.  Notes the chip pack is helpful up under the Rt axilla Shoulder motion really loosening up during treatment but pt reports it just stiffens back up quickly     Clinical Impairments Affecting Rehab Potential  pt will be starting radiation    PT Frequency  3x / week    PT Duration  8 weeks    PT Treatment/Interventions  ADLs/Self Care Home Management;Therapeutic  activities;Patient/family education;Manual lymph drainage;DME Instruction;Neuromuscular re-education;Orthotic Fit/Training;Therapeutic exercise;Manual techniques;Passive range of motion;Taping;Compression bandaging;Scar mobilization    PT Next Visit Plan  MLD to bilateral chest, trunk, and UEs, cont PROM to bilateral shoulders, maybe bandage around chest, coordinate with [employer] for velcro garments    PT Home Exercise Plan  post op breast    Consulted and Agree with Plan of Care  Patient       Patient will benefit from skilled therapeutic intervention in order to improve the following deficits and impairments:  Decreased skin integrity, Decreased knowledge of use of DME, Increased fascial restricitons, Pain, Postural dysfunction, Decreased scar mobility, Decreased range of motion, Decreased strength, Impaired UE functional use, Obesity, Increased edema, Decreased knowledge of precautions  Visit Diagnosis: Postmastectomy lymphedema  Stiffness of right shoulder, not elsewhere classified  Stiffness of left shoulder joint  Muscle weakness (generalized)  Aftercare following surgery for neoplasm     Problem List Patient Active Problem List   Diagnosis Date Noted  . Carcinoma of axillary tail of breast, right ([place]) 09/15/2018  . Cancer of left female breast  ([place]) 07/20/2018  . Bilateral lower extremity edema  06/15/2018  . 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 Shortness of breath on exertion 06/15/2018  . Chronic diastolic heart failure ([place]) 06/15/2018  . Port-A-Cath in place 04/25/2018  . Hypertensive retinopathy 09/09/2017  . Elevated TSH 08/15/2015  . Prolonged Q-T interval on ECG 08/14/2015  . Diabetic retinopathy ([place]) 03/26/2015  . Abnormality of gait 01/06/2013  . CTS (carpal tunnel syndrome) 03/09/2012  . Urinary frequency 07/14/2011  . Chronic venous insufficiency   . Routine health maintenance 03/09/2011  . CAD (coronary artery disease)   . Benign essential HTN    . Hyperlipidemia   . Colonic polyp   . Morbid obesity ([place])   . Chronic pain   . Malignant neoplasm of lower-inner quadrant of left breast in female, estrogen receptor positive ([place])   . Iron deficiency anemia   . Seasonal allergies   . OSA (obstructive sleep apnea) 02/16/2007  . Type II diabetes mellitus 10/04/2004    Shan Levans, PT 09/18/2018, 3:38 PM  [place], [place], [address] Phone: [phone]   Fax:  [phone]  Name: ISABEAU MCCALLA MRN: [id number] Date of Birth: 09/25/1951

## 2018-09-18 NOTE — Telephone Encounter (Signed)
The patient is asking for a new mask as it is time for her renewal.

## 2018-09-18 NOTE — Telephone Encounter (Signed)
Pt requesting new CPAP Supplies for a new mask and accessories from [facility].  Please advise.

## 2018-09-19 ENCOUNTER — Encounter: Payer: Self-pay | Admitting: Dietician

## 2018-09-19 ENCOUNTER — Ambulatory Visit (INDEPENDENT_AMBULATORY_CARE_PROVIDER_SITE_OTHER): Payer: Medicare Other | Admitting: Dietician

## 2018-09-19 DIAGNOSIS — E1140 Type 2 diabetes mellitus with diabetic neuropathy, unspecified: Secondary | ICD-10-CM

## 2018-09-19 DIAGNOSIS — Z713 Dietary counseling and surveillance: Secondary | ICD-10-CM | POA: Diagnosis not present

## 2018-09-19 DIAGNOSIS — Z794 Long term (current) use of insulin: Secondary | ICD-10-CM | POA: Diagnosis not present

## 2018-09-19 NOTE — Patient Instructions (Signed)
Take 3 units Novolog about 15 minutes before each meal 3x/day.  Check blood sugar 3x/day so we can see how this is working.   Do not take the Novolog meal dose if you are not eating, but if you eat later, you should still take the 3 units with the later meal.   Stop the correction Novolog today and start meal time insulin with dinner tonight.   Call me with questions.  [name]

## 2018-09-19 NOTE — Progress Notes (Signed)
[name] reviewed her meter download with Dr. Heber Unity and I and we agree with her recommended changes to add mealtime insulin at a low dose.

## 2018-09-19 NOTE — Progress Notes (Signed)
  Medical Nutrition Therapy:  Appt start time: 8889 end time:  1694 Visit # 4   Assessment:  Primary concerns today: glycemic control. Patient seen today with her daughter. She is ~ 8 weeks post op from a double mastectomy and about the begin radiation and Xeloda. She'd like to have better blood sugars. She reports taking some correction Novolog but less than  What is needed according to the directions. She is taking between 2-4 units total /day as she is checking 1-2 times a day most days.   MEDICATIONS: 30 units toujeo in am, metformin 1000 mg twice a day BLOOD SUGAR:average 198 for the past 30 days. METER DOWNLOAD Report summary is from last 30 days,  Average tests per day: 1.7 Average blood glucose: 198 Range: minimum: 118 and maximum: 342 % in target range: 31 % below target range: 0 % above target range: 69 % hypoglycemia: 0 Notes about patterns: slow rise over the day from 198 at breakfast, 207 at lunch, 237 before dinner  ANTHROPOMETRICS: she reports ~ 213# DIETARY INTAKE: She cut out bread, decreased her portions of rice, potatoes and fruit. 24-hr recall suggests intake of 1200-1400 kcal:  (Up at  AM) B ( AM)- grits, sausage, small glass of smoothie, tea and water  L ( PM)- baked chicken, rice, green beans and yucca, lemon water Snk ( PM)- peanuts  D ( PM)- kale, pigs tail, water  Snk ( PM)-  Sometimes grapes, 1/2 grapefruit Typical day? Yes.   Dining Out: a few times a week  Usual physical activity: not discussed today   Progress Towards Goal(s):  In progress.   Nutritional Diagnosis:     2.2 altered nutrition related laboratory values of A1C of >7% as related to higher blood sugar as evidenced her A1C of 8.9%.(increased from 8.3%) and her elevated blood sugars.   Intervention:  Nutrition education about trying meal time doses rather than correction insulin.  Coordination of care: Discussed trail of set meal tine doses with Drs. Eppie Gibson and [place]. Suggest send  updated rx for new mealtime insulin doses, remove Novolog Mix from medication list  Teaching Method Utilized: Visual, Auditory Handouts given during visit include:CBG download, mealtime insulin,  After visit summary Barriers to learning/adherence to lifestyle change: competing values Demonstrated degree of understanding via:  Teach Back   Monitoring/Evaluation:  Dietary intake, exercise, meter in 2-3 weeks [name], RD 09/19/2018 2:20 PM.    .   [name], RD 09/19/2018 2:19 PM.

## 2018-09-19 NOTE — Telephone Encounter (Signed)
Thank you. Order to be faxed to [facility].  Patient to be notified.

## 2018-09-19 NOTE — Telephone Encounter (Signed)
I have ordered the listed facemask from her last sleep study: Medium size Resmed Full Face Mask AirFit F10

## 2018-09-20 ENCOUNTER — Encounter: Payer: Self-pay | Admitting: Radiation Oncology

## 2018-09-20 ENCOUNTER — Ambulatory Visit
Admission: RE | Admit: 2018-09-20 | Discharge: 2018-09-20 | Disposition: A | Discharge status: Home / Self Care | Payer: Medicare Other | Source: Ambulatory Visit | Attending: Radiation Oncology | Admitting: Radiation Oncology

## 2018-09-20 ENCOUNTER — Ambulatory Visit: Payer: Medicare Other

## 2018-09-20 ENCOUNTER — Other Ambulatory Visit: Payer: Self-pay

## 2018-09-20 VITALS — BP 163/95 | HR 90 | Temp 98.4°F | Resp 18 | Wt 212.0 lb

## 2018-09-20 DIAGNOSIS — Z7982 Long term (current) use of aspirin: Secondary | ICD-10-CM | POA: Insufficient documentation

## 2018-09-20 DIAGNOSIS — Z170 Estrogen receptor positive status [ER+]: Secondary | ICD-10-CM | POA: Insufficient documentation

## 2018-09-20 DIAGNOSIS — I972 Postmastectomy lymphedema syndrome: Secondary | ICD-10-CM | POA: Diagnosis not present

## 2018-09-20 DIAGNOSIS — Z794 Long term (current) use of insulin: Secondary | ICD-10-CM | POA: Diagnosis not present

## 2018-09-20 DIAGNOSIS — C50611 Malignant neoplasm of axillary tail of right female breast: Secondary | ICD-10-CM | POA: Diagnosis not present

## 2018-09-20 DIAGNOSIS — C50312 Malignant neoplasm of lower-inner quadrant of left female breast: Secondary | ICD-10-CM | POA: Diagnosis not present

## 2018-09-20 DIAGNOSIS — Z79899 Other long term (current) drug therapy: Secondary | ICD-10-CM | POA: Insufficient documentation

## 2018-09-20 DIAGNOSIS — Z510 Encounter for antineoplastic radiation therapy: Secondary | ICD-10-CM | POA: Insufficient documentation

## 2018-09-20 DIAGNOSIS — M25612 Stiffness of left shoulder, not elsewhere classified: Secondary | ICD-10-CM

## 2018-09-20 DIAGNOSIS — M25611 Stiffness of right shoulder, not elsewhere classified: Secondary | ICD-10-CM

## 2018-09-20 DIAGNOSIS — C773 Secondary and unspecified malignant neoplasm of axilla and upper limb lymph nodes: Secondary | ICD-10-CM | POA: Diagnosis not present

## 2018-09-20 DIAGNOSIS — Z483 Aftercare following surgery for neoplasm: Secondary | ICD-10-CM | POA: Diagnosis not present

## 2018-09-20 DIAGNOSIS — Z923 Personal history of irradiation: Secondary | ICD-10-CM | POA: Insufficient documentation

## 2018-09-20 DIAGNOSIS — M6281 Muscle weakness (generalized): Secondary | ICD-10-CM | POA: Diagnosis not present

## 2018-09-20 NOTE — Therapy (Signed)
[place], [place], [address] Phone: [phone]   Fax:  [phone]  Physical Therapy Treatment  Patient Details  Name: [name] MRN: [id number] Date of Birth: 21-Jan-1951 Referring Provider (PT): Dr. Marlou Starks    Encounter Date: 09/20/2018  PT End of Session - 09/20/18 1120    Visit Number  6    Number of Visits  25    Date for PT Re-Evaluation  11/06/17    PT Start Time  1048   pt arrived late due to radiation   PT Stop Time  1118    PT Time Calculation (min)  30 min    Activity Tolerance  Patient tolerated treatment well    Behavior During Therapy  [facility] for tasks assessed/performed       Past Medical History:  Diagnosis Date  . Arthritis    "knees, lower back" (08/18/2017)  . 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 S/p lumpectomy and radiation.  Marland Kitchen CAD (coronary artery disease) 11/07   cath 11/11 :  3V CADsee report   . Cataract    small both eyes  . Chronic lower back pain   . Chronic pain    MR 08/07/10 :  Spondylosis L4-5 with B foraminal narrowing and L4 nerve root enchroachment B.  . Chronic venous insufficiency 2012   Has had full W/U incl ECHO, LFT's, Cr, Umicro, TSH, BNP. Symptomatic treatment.  . Colonic polyp 1995   Colonoscopies 1994, 2000, and 02/02/2011. Last had 9 polyps - Tubular adenoma and tubulovillous was the pathology results without high grade dysplasia  . Diabetes mellitus type II    Insulin dependent. Has worked with [name] R previously.  . Diastolic CHF, chronic ([place])    NL EF by echo 01/2012, Gr I dd  . Essential hypertension    Requires 5 drug therapy and still difficult to control.  Marland Kitchen GERD (gastroesophageal reflux disease)   . Heart murmur   . Hyperlipidemia    On daily statin  . Iron deficiency anemia    Ferritin was 12 in 2012. on FeSo4. Has had colon and EGD 02/02/11 that showed mild gastritis and colon  polyps.  . Obesity    Morbid. HAs worked with [name] T previously.  . OSA (obstructive sleep apnea) 02/16/2007   02/2007 : Moderate AHI 35.6, O2 sat decreased to 65%, CPAP titration to 16 with AHI of 3.6. 02/2014 : Rare respiratory events with sleep disturbance, within normal limits. AHI 0.4 per hour      . OSA on CPAP   . Personal history of chemotherapy   . Personal history of radiation therapy    "to my left breast"  . Refusal of blood transfusions as patient is Jehovah's Witness   . Seasonal allergies    "grass pollen"    Past Surgical History:  Procedure Laterality Date  . BREAST LUMPECTOMY Left 2006  . BREAST LUMPECTOMY W/ NEEDLE LOCALIZATION  2006   34 Chemo RX  . CARDIAC CATHETERIZATION N/A 08/15/2015   Procedure: Left Heart Cath and Coronary Angiography;  Surgeon: Troy Sine, MD;  Location: [place] [facility];  Service: Cardiovascular;  Laterality: N/A;  . COLONOSCOPY W/ BIOPSIES AND POLYPECTOMY  1994, 2000, 2012, 26/018  . IR IMAGING GUIDED PORT INSERTION  04/17/2018  . LEFT HEART CATH AND CORONARY ANGIOGRAPHY N/A 08/19/2017   Procedure: LEFT HEART CATH AND CORONARY ANGIOGRAPHY;  Surgeon: Belva Crome,  MD;  Location: [place] [facility];  Service: Cardiovascular;  Laterality: N/A;  . MASTECTOMY MODIFIED RADICAL Bilateral 07/20/2018  . MASTECTOMY MODIFIED RADICAL Bilateral 07/20/2018   Procedure: BILATERAL MODIFIED RADICAL MASTECTOMIES;  Surgeon: Jovita Kussmaul, MD;  Location: [place];  Service: General;  Laterality: Bilateral;  . PORTACATH PLACEMENT N/A 04/14/2018   Procedure: ATTEMPED INSERTION PORT-A-CATH;  Surgeon: Jovita Kussmaul, MD;  [facility];  Service: General;  Laterality: N/A;  . TONSILLECTOMY AND ADENOIDECTOMY  1964  . TOTAL ABDOMINAL HYSTERECTOMY  1985   "I had endometrosis"  . ULTRASOUND GUIDANCE FOR VASCULAR ACCESS  08/19/2017   Procedure: Ultrasound Guidance For Vascular Access;  Surgeon: Belva Crome, MD;  Location: [place] [facility];  Service:  Cardiovascular;;    There were no vitals filed for this visit.  Subjective Assessment - 09/20/18 1048    Subjective  I just had the radiation simulation today and I was able to get my arms up there but it was really tight.     Pertinent History   Pt had breast cancer diagnosed in May 2019 with 2 blisters popped up on left breast that got worse in June, She was diagnosed with left inflammatory breast cancer and had right axillay node involvment also.  Pt has bilateral mastectomy on October 17 with 1/2 nodes positive on left and 7/12/ nodes positive on right  She underwent chemotherapy from July - Sept and had some problems with fatigue and tingling in hands and fingertips She was not able to get the full does of chemo due to CHF.  The surgery was a success and she plans to start radiation to the right side on Dec. 10 Past history includes lumpectomy and radiation to left in 2006, CHF. lower back pain that causes her to walk with a cane  Past history also includes diabetes and high blood pressure, pt had drains removed from mastectomy on 09/05/18    Patient Stated Goals  to be able to get my limbs back in circulation and get rid of this fluid    Currently in Pain?  No/denies                       [facility] Adult PT Treatment/Exercise - 09/20/18 0001      Manual Therapy   Manual Lymphatic Drainage (MLD)  short neck, superficial and deep abdominals, right inguinal nodes and establishment of Rt axillo inguinal pathway, right chest and lateral trunk moving fluid towards pathway    Passive ROM  in supine to Rt and Lt UEs to tolerance into flexion and abduction               PT Short Term Goals - 08/02/18 2012      PT SHORT TERM GOAL #1   Title  Pt will report feelings of fullness in shoulders and trunk are decreased by at least 50% with manual lymph draiange and compression    Time  3    Period  Weeks    Status  New        PT Long Term Goals - 09/11/18 1217      PT LONG  TERM GOAL #1   Title  Pt will have at least 125 degrees of both shoulder abduction so that she can receive radiation and get her shirt on  by herself    Time  8    Period  Weeks    Status  New    Target  Date  11/06/18      PT LONG TERM GOAL #2   Title  Pt reports she has enough left and right shoulder strength so that she can get her dishes to the bottom shelf of the kitchen cabinet to prepare meals at home     Time  8    Period  Weeks    Status  New    Target Date  11/06/18      PT LONG TERM GOAL #3   Title  Pt will verbalize she has an understanding of lymphedema risk reduction practices    Time  8    Period  Weeks    Status  New    Target Date  11/06/18      PT LONG TERM GOAL #4   Title  Pt will obtain appropriate compression garments for long term managment of lymphedema of BUE and chest.    Time  8    Period  Weeks    Status  New    Target Date  11/06/18      PT LONG TERM GOAL #5   Title  Pt will report a 70% improvement in feelings of tightness and discomfort across chest to allow improved comfort.     Time  8    Period  Weeks    Status  New    Target Date  11/06/18            Plan - 09/20/18 1120    Clinical Impression Statement  Continued with focus on manual therapy to bil upper quadrants. Pt had radiation simulation and reports being able to get into position though with difficulty due to tightness. She reports feeling much better after session today with decreased tightness.    Rehab Potential  Good    Clinical Impairments Affecting Rehab Potential  pt will be starting radiation    PT Frequency  3x / week    PT Duration  8 weeks    PT Treatment/Interventions  ADLs/Self Care Home Management;Therapeutic activities;Patient/family education;Manual lymph drainage;DME Instruction;Neuromuscular re-education;Orthotic Fit/Training;Therapeutic exercise;Manual techniques;Passive range of motion;Taping;Compression bandaging;Scar mobilization    PT Next Visit Plan  MLD  to bilateral chest, trunk, and UEs, cont PROM to bilateral shoulders, maybe bandage around chest, coordinate with [employer] for velcro garments    Consulted and Agree with Plan of Care  Patient       Patient will benefit from skilled therapeutic intervention in order to improve the following deficits and impairments:  Decreased skin integrity, Decreased knowledge of use of DME, Increased fascial restricitons, Pain, Postural dysfunction, Decreased scar mobility, Decreased range of motion, Decreased strength, Impaired UE functional use, Obesity, Increased edema, Decreased knowledge of precautions  Visit Diagnosis: Postmastectomy lymphedema  Stiffness of right shoulder, not elsewhere classified  Stiffness of left shoulder joint  Muscle weakness (generalized)  Aftercare following surgery for neoplasm     Problem List Patient Active Problem List   Diagnosis Date Noted  . Carcinoma of axillary tail of breast, right ([place]) 09/15/2018  . Cancer of left female breast  ([place]) 07/20/2018  . Bilateral lower extremity edema 06/15/2018  . 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 Shortness of breath on exertion 06/15/2018  . Chronic diastolic heart failure ([place]) 06/15/2018  . Port-A-Cath in place 04/25/2018  . Hypertensive retinopathy 09/09/2017  . Elevated TSH 08/15/2015  . Prolonged Q-T interval on ECG 08/14/2015  . Diabetic retinopathy ([place]) 03/26/2015  . Abnormality of gait 01/06/2013  . CTS (carpal tunnel syndrome) 03/09/2012  . Urinary  frequency 07/14/2011  . Chronic venous insufficiency   . Routine health maintenance 03/09/2011  . CAD (coronary artery disease)   . Benign essential HTN   . Hyperlipidemia   . Colonic polyp   . Morbid obesity ([place])   . Chronic pain   . Malignant neoplasm of lower-inner quadrant of left breast in female, estrogen receptor positive ([place])   . Iron deficiency anemia   . Seasonal allergies   . OSA (obstructive sleep apnea) 02/16/2007  . Type II  diabetes mellitus 10/04/2004    Otelia Limes, PTA 09/20/2018, 12:09 PM  [place], [place], [address] Phone: [phone]   Fax:  [phone]  Name: [name] MRN: [id number] Date of Birth: Jan 16, 1951

## 2018-09-20 NOTE — Progress Notes (Addendum)
Radiation Oncology         [phone] ________________________________  Name: [name] MRN: [id number]  Date: 09/20/2018  DOB: 17-Jul-1951  Follow-Up Visit Note  Outpatient  CC: Bartholomew Crews, MD  Bartholomew Crews, MD  Diagnosis and Prior Radiotherapy:    ICD-10-CM   1. Malignant neoplasm of lower-inner quadrant of left breast in female, estrogen receptor positive ([place]) C50.312    Z17.0   2. Carcinoma of axillary tail of breast, right ([place]) C50.611     Cancer Staging Carcinoma of axillary tail of breast, right ([place]) Staging form: Breast, AJCC 8th Edition - Pathologic: No Stage Recommended (ypT0, pN2a, cM0, G3, ER-, PR-, HER2-) - Signed by Eppie Gibson, MD on 09/15/2018  Malignant neoplasm of lower-inner quadrant of left breast in female, estrogen receptor positive ([place]) Staging form: Breast, AJCC 8th Edition - Pathologic: No Stage Recommended (ypT4d, pN1a, cM0, G3, ER-, PR-, HER2-) - Signed by Eppie Gibson, MD on 09/15/2018 - Clinical: Stage IIIC (cT4d, cN0, cM0, G3, ER+, PR-, HER2-) - Signed by Gardenia Phlegm, NP on 08/02/2018  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 Tammi Klippel.  CHIEF COMPLAINT: Here to discuss management of breast cancer  Narrative:  The patient returns today for follow-up to touch base and make sure any further questions are answered before she makes her decision on whether to proceed with radiotherapy.       No new complaints.   ALLERGIES:  is allergic to lisinopril; oxycodone-acetaminophen; percocet [oxycodone-acetaminophen]; victoza [liraglutide]; and atorvastatin.  Meds: Current Outpatient Medications  Medication Sig Dispense Refill  . ACCU-CHEK FASTCLIX LANCETS MISC Check blood sugar 3 times a day 102 each 5  . ACCU-CHEK GUIDE test strip USE 1  THREE TIMES DAILY 375 each 1  . amLODipine (NORVASC) 10 MG tablet TAKE ONE (1) TABLET BY MOUTH EVERY DAY (Patient taking differently: Take 10 mg  by mouth daily. ) 90 tablet 3  . aspirin EC 81 MG tablet Take 81 mg by mouth daily.    . capecitabine (XELODA) 500 MG tablet Take 2 tablets (1,000 mg total) by mouth 2 (two) times daily after a meal. Take on radiation days only, M-F 132 tablet 0  . chlorpheniramine-HYDROcodone (TUSSIONEX PENNKINETIC ER) 10-8 MG/5ML SUER Take 5 mLs by mouth 2 (two) times daily. 140 mL 0  . Coenzyme Q10 (CO Q 10) 100 MG CAPS Take 100 mg by mouth daily.     Marland Kitchen dexamethasone (DECADRON) 4 MG tablet Take 1 tablet (4 mg total) by mouth daily. Take 1 tablet day before chemo and 1 tablet day after chemo 10 tablet 0  . ezetimibe (ZETIA) 10 MG tablet TAKE ONE (1) TABLET BY MOUTH EVERY DAY 30 tablet 7  . furosemide (LASIX) 20 MG tablet TAKE 1 TABLET (20 MG TOTAL) BY MOUTH AS NEEDED FOR EDEMA. (Patient taking differently: Take 20 mg by mouth daily as needed for edema. ) 30 tablet 2  . gabapentin (NEURONTIN) 300 MG capsule Take 1 capsule (300 mg total) by mouth at bedtime. 30 capsule 2  . insulin aspart (NOVOLOG FLEXPEN) 100 UNIT/ML FlexPen If 150-199 inject 2 units,200-250 inject 3 units,251- 350 inject 4 units,351-400 inject 5 units, call office. 15 mL 1  . insulin aspart protamine- aspart (NOVOLOG MIX 70/30) (70-30) 100 UNIT/ML injection Inject 2-5 Units into the skin See admin instructions. Inject per sliding scale 201-250 = 2 units, 251-300 = 3 units, 301-400 = 4 units, >401 = 5 units and call  MD SQ 3 times daily.    . Insulin Glargine (TOUJEO SOLOSTAR) 300 UNIT/ML SOPN Inject 30 Units into the skin daily. 12 mL 3  . Insulin Pen Needle 32G X 4 MM MISC USE FOR INJECTIONS up to 4 times daily. Diagnosis code E11.40, Z79.4 200 each 5  . lidocaine-prilocaine (EMLA) cream Apply to affected area once 30 g 3  . loratadine (CLARITIN) 10 MG tablet Take 10 mg by mouth daily as needed for allergies.    Marland Kitchen losartan (COZAAR) 50 MG tablet TAKE ONE (1) TABLET BY MOUTH EVERY DAY (Patient taking differently: Take 50 mg by mouth daily. ) 90 tablet  3  . meloxicam (MOBIC) 7.5 MG tablet Take 1 tablet (7.5 mg total) by mouth daily as needed for pain. 90 tablet 0  . metFORMIN (GLUCOPHAGE) 1000 MG tablet TAKE ONE (1) TABLET BY MOUTH TWO (2) TIMES DAILY WITH A MEAL (Patient taking differently: Take 1,000 mg by mouth 2 (two) times daily with a meal. ) 180 tablet 3  . methocarbamol (ROBAXIN) 750 MG tablet Take 1 tablet (750 mg total) by mouth every 6 (six) hours as needed for muscle spasms. 30 tablet 1  . metoprolol succinate (TOPROL-XL) 100 MG 24 hr tablet TAKE ONE (1) TABLET BY MOUTH EVERY DAY (Patient taking differently: Take 100 mg by mouth daily. ) 90 tablet 3  . nitroGLYCERIN (NITRODUR - DOSED IN MG/24 HR) 0.4 mg/hr patch APPLY ONE PATCH ON SKIN EVERY DAY. LEAVE ON FOR 12 HOURS AND OFF FOR 12 HOURS. 100 patch 3  . nitroGLYCERIN (NITROSTAT) 0.4 MG SL tablet PLACE 1 TABLET UNDER THE TONGUE EVERY 5 MINUTES AS NEEDED FOR CHEST PAIN 25 tablet 7  . Olopatadine HCl (PATADAY) 0.2 % SOLN Place 1 drop into both eyes daily as needed (for allergies).     Marland Kitchen omeprazole (PRILOSEC) 40 MG capsule Take 1 capsule (40 mg total) by mouth daily. (Patient taking differently: Take 40 mg by mouth daily as needed (for acid reflux). ) 30 capsule 5  . ondansetron (ZOFRAN) 8 MG tablet Take 1 tablet (8 mg total) by mouth 2 (two) times daily as needed for refractory nausea / vomiting. Start on day 3 after chemo. 30 tablet 1  . prochlorperazine (COMPAZINE) 10 MG tablet Take 1 tablet (10 mg total) by mouth every 6 (six) hours as needed (Nausea or vomiting). 60 tablet 2  . spironolactone (ALDACTONE) 25 MG tablet Take 1 tablet (25 mg total) by mouth daily. 90 tablet 3  . traMADol (ULTRAM) 50 MG tablet Take 1 tablet (50 mg total) by mouth every 12 (twelve) hours as needed. (Patient taking differently: Take 50 mg by mouth every 12 (twelve) hours as needed for moderate pain. ) 30 tablet 0  . vitamin B-12 (CYANOCOBALAMIN) 1000 MCG tablet Take 1,000 mcg by mouth daily.    . isosorbide  mononitrate (IMDUR) 120 MG 24 hr tablet Take 2 tablets (240 mg total) by mouth every morning. (Patient taking differently: Take 120 mg by mouth 2 (two) times daily. ) 30 tablet 11   No current facility-administered medications for this encounter.     Physical Findings: The patient is in no acute distress. Patient is alert and oriented.  weight is 212 lb (96.2 kg). Her oral temperature is 98.4 F (36.9 C). Her blood pressure is 163/95 (abnormal) and her pulse is 90. Her respiration is 18 and oxygen saturation is 99%.      Lab Findings: Lab Results  Component Value Date   WBC 6.7  09/07/2018   HGB 10.3 (L) 09/07/2018   HCT 33.1 (L) 09/07/2018   MCV 90.2 09/07/2018   PLT 215 09/07/2018    Radiographic Findings: No results found.  Impression/Plan: Breast Cancer  She and I spoke last week after her consultation, over the phone, during which I notified her that I'd talked with Dr Lindi Adie.  I explained to her then, and today, that he is in agreement with the plan to give radiotherapy to the bilateral chest wall and nodes.    She states did not have time to call Dr Lindi Adie for further discussion due to PT appts.           Today, we again had a lengthy discussion and reviewed the risks, benefits, and side effects of radiotherapy to the bilateral chest wall and regional nodes. She understands that risks of treatment can be heightened due to reirradiation of the left side. She has decided to proceed with comprehensive treatment to both sides. We talked about exploring the breath hold technique to the left side and employing this technique if it would help minimize heart exposure. We discussed that I will not treat her internal mammary nodes if this will excessively increase heart exposure, as she would like me to be as conservative as reasonably possible in shielding her heart. I told her that I will treat the most high-risk tissues while keeping her heart shielded within reasonable limits. We signed  the consent form today and will proceed with simulation this morning. All of her and her family's questions were answered to the best of my ability, and they expressed appreciation for this visit. She understands that her disease is very high risk for locoregional recurrence and radiotherapy will reduce the risk of such recurrence by about 2/3.  I spent 15 minutes face to face with the patient and more than 50% of that time was spent in counseling and/or coordination of care. _____________________________________   Eppie Gibson, MD  This document serves as a record of services personally performed by Eppie Gibson, MD. It was created on her behalf by Rae Lips, a trained medical scribe. The creation of this record is based on the scribe's personal observations and the provider's statements to them. This document has been checked and approved by the attending provider.

## 2018-09-20 NOTE — Progress Notes (Signed)
Radiation Oncology         [phone]) [phone] ________________________________  Name: [name] MRN: [id number]  Date: 09/20/2018  DOB: [birth year]  SIMULATION AND TREATMENT PLANNING NOTE  // Special treatment procedure  Outpatient  DIAGNOSIS:     ICD-10-CM   1. Carcinoma of axillary tail of breast, right ([place]) C50.611   2. Malignant neoplasm of lower-inner quadrant of left breast in female, estrogen receptor positive ([place]) C50.312    Z17.0     NARRATIVE:  The patient was brought to the [facility].  Identity was confirmed.  All relevant records and images related to the planned course of therapy were reviewed.  The patient freely provided informed written consent to proceed with treatment after reviewing the details related to the planned course of therapy. The consent form was witnessed and verified by the simulation staff.    Then, the patient was set-up in a stable reproducible supine position for radiation therapy with her ipsilateral arm over her head, and her upper body secured in a custom-made Vac-lok device.  CT images were obtained.  Surface markings were placed.  The CT images were loaded into the planning software.    Breathhold technique was tested but not found to be beneficial.  TREATMENT PLANNING NOTE: Treatment planning then occurred.  The radiation prescription was entered and confirmed.     A total of 9 medically necessary complex treatment devices were fabricated and supervised by me: 8 fields with MLCs for custom blocks to protect heart, and lungs;  and, a Vac-lok. MORE COMPLEX DEVICES MAY BE MADE IN DOSIMETRY FOR FIELD IN FIELD BEAMS FOR DOSE HOMOGENEITY.  I have requested : 3D Simulation which is medically necessary to give adequate dose to at risk tissues while sparing lungs and heart.  I have requested a DVH of the following structures: lungs, heart, esophagus, spinal cord.    The patient will receive 50 Gy in 25 fractions to the bilateral chest  walls, SCV fossas, and axillary nodes, with 8 fields. This will not be followed by a boost.  Optical Surface Tracking Plan:  Since intensity modulated radiotherapy (IMRT) and 3D conformal radiation treatment methods are predicated on accurate and precise positioning for treatment, intrafraction motion monitoring is medically necessary to ensure accurate and safe treatment delivery. The ability to quantify intrafraction motion without excessive ionizing radiation dose can only be performed with optical surface tracking. Accordingly, surface imaging offers the opportunity to obtain 3D measurements of patient position throughout IMRT and 3D treatments without excessive radiation exposure. I am ordering optical surface tracking for this patient's upcoming course of radiotherapy.  ________________________________   Reference:  Ursula Alert, J, et al. Surface imaging-based analysis of intrafraction motion for breast radiotherapy patients.Journal of [place], n. 6, nov. 2014. ISSN [id number].  Available at: <http://www.jacmp.org/index.php/jacmp/article/view/4957>.   Special Treatment Procedure Note: The patient will be receiving chemotherapy concurrently. Chemotherapy heightens the risk of side effects. I have considered this during the patient's treatment planning process and will monitor the patient accordingly for side effects on a weekly basis. Concurrent chemotherapy increases the complexity of this patient's treatment and therefore this constitutes a special treatment procedure.  Special Treatment Procedure Note: The patient received prior radiotherapy in her current fields.  Prior regional radiotherapy increases the risk of side effects from treatment. I have considered this in the treatment planning process and have aimed to minimize tissue overlap.  This increases the complexity of this patient's  treatment and therefore this constitutes a special  treatment procedure.   -----------------------------------  Eppie Gibson, MD

## 2018-09-21 ENCOUNTER — Ambulatory Visit: Payer: Medicare Other

## 2018-09-22 ENCOUNTER — Ambulatory Visit: Payer: Medicare Other

## 2018-09-22 ENCOUNTER — Ambulatory Visit: Payer: Medicare Other | Admitting: Rehabilitation

## 2018-09-22 DIAGNOSIS — Z483 Aftercare following surgery for neoplasm: Secondary | ICD-10-CM

## 2018-09-22 DIAGNOSIS — M6281 Muscle weakness (generalized): Secondary | ICD-10-CM | POA: Diagnosis not present

## 2018-09-22 DIAGNOSIS — M25612 Stiffness of left shoulder, not elsewhere classified: Secondary | ICD-10-CM | POA: Diagnosis not present

## 2018-09-22 DIAGNOSIS — M25611 Stiffness of right shoulder, not elsewhere classified: Secondary | ICD-10-CM

## 2018-09-22 DIAGNOSIS — I972 Postmastectomy lymphedema syndrome: Secondary | ICD-10-CM

## 2018-09-22 NOTE — Therapy (Signed)
[place], [place], [address] Phone: [phone]   Fax:  [phone]  Physical Therapy Treatment  Patient Details  Name: [name] MRN: [id number] Date of Birth: 1951-09-03 Referring Provider (PT): Dr. Marlou Starks    Encounter Date: 09/22/2018  PT End of Session - 09/22/18 1008    Visit Number  7    Number of Visits  25    Date for PT Re-Evaluation  11/06/17    PT Start Time  0936    PT Stop Time  1015    PT Time Calculation (min)  39 min    Activity Tolerance  Patient tolerated treatment well    Behavior During Therapy  [facility] for tasks assessed/performed       Past Medical History:  Diagnosis Date  . Arthritis    "knees, lower back" (08/18/2017)  . 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 S/p lumpectomy and radiation.  Marland Kitchen CAD (coronary artery disease) 11/07   cath 11/11 :  3V CADsee report   . Cataract    small both eyes  . Chronic lower back pain   . Chronic pain    MR 08/07/10 :  Spondylosis L4-5 with B foraminal narrowing and L4 nerve root enchroachment B.  . Chronic venous insufficiency 2012   Has had full W/U incl ECHO, LFT's, Cr, Umicro, TSH, BNP. Symptomatic treatment.  . Colonic polyp 1995   Colonoscopies 1994, 2000, and 02/02/2011. Last had 9 polyps - Tubular adenoma and tubulovillous was the pathology results without high grade dysplasia  . Diabetes mellitus type II    Insulin dependent. Has worked with [name] R previously.  . Diastolic CHF, chronic ([place])    NL EF by echo 01/2012, Gr I dd  . Essential hypertension    Requires 5 drug therapy and still difficult to control.  Marland Kitchen GERD (gastroesophageal reflux disease)   . Heart murmur   . Hyperlipidemia    On daily statin  . Iron deficiency anemia    Ferritin was 12 in 2012. on FeSo4. Has had colon and EGD 02/02/11 that showed mild gastritis and colon polyps.  . Obesity    Morbid. HAs  worked with [name] T previously.  . OSA (obstructive sleep apnea) 02/16/2007   02/2007 : Moderate AHI 35.6, O2 sat decreased to 65%, CPAP titration to 16 with AHI of 3.6. 02/2014 : Rare respiratory events with sleep disturbance, within normal limits. AHI 0.4 per hour      . OSA on CPAP   . Personal history of chemotherapy   . Personal history of radiation therapy    "to my left breast"  . Refusal of blood transfusions as patient is Jehovah's Witness   . Seasonal allergies    "grass pollen"    Past Surgical History:  Procedure Laterality Date  . BREAST LUMPECTOMY Left 2006  . BREAST LUMPECTOMY W/ NEEDLE LOCALIZATION  2006   34 Chemo RX  . CARDIAC CATHETERIZATION N/A 08/15/2015   Procedure: Left Heart Cath and Coronary Angiography;  Surgeon: Troy Sine, MD;  Location: [facility];  Service: Cardiovascular;  Laterality: N/A;  . COLONOSCOPY W/ BIOPSIES AND POLYPECTOMY  1994, 2000, 2012, 26/018  . IR IMAGING GUIDED PORT INSERTION  04/17/2018  . LEFT HEART CATH AND CORONARY ANGIOGRAPHY N/A 08/19/2017   Procedure: LEFT HEART CATH AND CORONARY ANGIOGRAPHY;  Surgeon: Belva Crome, MD;  Location: [facility];  Service: Cardiovascular;  Laterality: N/A;  . MASTECTOMY MODIFIED RADICAL Bilateral 07/20/2018  . MASTECTOMY MODIFIED RADICAL Bilateral 07/20/2018   Procedure: BILATERAL MODIFIED RADICAL MASTECTOMIES;  Surgeon: Jovita Kussmaul, MD;  Location: [facility];  Service: General;  Laterality: Bilateral;  . PORTACATH PLACEMENT N/A 04/14/2018   Procedure: ATTEMPED INSERTION PORT-A-CATH;  Surgeon: Jovita Kussmaul, MD;  Location: [facility];  Service: General;  Laterality: N/A;  . TONSILLECTOMY AND ADENOIDECTOMY  1964  . TOTAL ABDOMINAL HYSTERECTOMY  1985   "I had endometrosis"  . ULTRASOUND GUIDANCE FOR VASCULAR ACCESS  08/19/2017   Procedure: Ultrasound Guidance For Vascular Access;  Surgeon: Belva Crome, MD;  Location: [facility];  Service: Cardiovascular;;    There were no  vitals filed for this visit.  Subjective Assessment - 09/22/18 0937    Subjective  Nothing new to report    Pertinent History   Pt had breast cancer diagnosed in May 2019 with 2 blisters popped up on left breast that got worse in June, She was diagnosed with left inflammatory breast cancer and had right axillay node involvment also.  Pt has bilateral mastectomy on October 17 with 1/2 nodes positive on left and 7/12/ nodes positive on right  She underwent chemotherapy from July - Sept and had some problems with fatigue and tingling in hands and fingertips She was not able to get the full does of chemo due to CHF.  The surgery was a success and she plans to start radiation to the right side on Dec. 10 Past history includes lumpectomy and radiation to left in 2006, CHF. lower back pain that causes her to walk with a cane  Past history also includes diabetes and high blood pressure, pt had drains removed from mastectomy on 09/05/18    Patient Stated Goals  to be able to get my limbs back in circulation and get rid of this fluid    Currently in Pain?  No/denies                       [facility] Adult PT Treatment/Exercise - 09/22/18 0001      Shoulder Exercises: Seated   Other Seated Exercises  XYW seated with vcs and demonstration; breath cueing       Shoulder Exercises: Pulleys   Flexion  2 minutes    ABduction  2 minutes      Manual Therapy   Manual Lymphatic Drainage (MLD)  short neck, superficial and deep abdominals, right inguinal nodes and establishment of Rt axillo inguinal pathway, right chest and lateral trunk moving fluid towards pathway    Passive ROM  in supine to Rt and Lt UEs to tolerance into flexion and abduction               PT Short Term Goals - 08/02/18 2012      PT SHORT TERM GOAL #1   Title  Pt will report feelings of fullness in shoulders and trunk are decreased by at least 50% with manual lymph draiange and compression    Time  3    Period  Weeks     Status  New        PT Long Term Goals - 09/11/18 1217      PT LONG TERM GOAL #1   Title  Pt will have at least 125 degrees of both shoulder abduction so that she can receive radiation and get her shirt on  by herself    Time  8    Period  Weeks    Status  New    Target Date  11/06/18      PT LONG TERM GOAL #2   Title  Pt reports she has enough left and right shoulder strength so that she can get her dishes to the bottom shelf of the kitchen cabinet to prepare meals at home     Time  8    Period  Weeks    Status  New    Target Date  11/06/18      PT LONG TERM GOAL #3   Title  Pt will verbalize she has an understanding of lymphedema risk reduction practices    Time  8    Period  Weeks    Status  New    Target Date  11/06/18      PT LONG TERM GOAL #4   Title  Pt will obtain appropriate compression garments for long term managment of lymphedema of BUE and chest.    Time  8    Period  Weeks    Status  New    Target Date  11/06/18      PT LONG TERM GOAL #5   Title  Pt will report a 70% improvement in feelings of tightness and discomfort across chest to allow improved comfort.     Time  8    Period  Weeks    Status  New    Target Date  11/06/18            Plan - 09/22/18 1009    Clinical Impression Statement  continued with bilateral shoulder AROM/ROM focus with some MLD.  added some more self stretching with pulleys which pt enjoyed and may order at home.  The Lt pectoralis is very tight today compared to the Rt      PT Frequency  3x / week    PT Duration  8 weeks    PT Treatment/Interventions  ADLs/Self Care Home Management;Therapeutic activities;Patient/family education;Manual lymph drainage;DME Instruction;Neuromuscular re-education;Orthotic Fit/Training;Therapeutic exercise;Manual techniques;Passive range of motion;Taping;Compression bandaging;Scar mobilization    PT Next Visit Plan  continue MLD as needed trunk and UEs, shoulder ROM    Consulted and Agree with  Plan of Care  Patient       Patient will benefit from skilled therapeutic intervention in order to improve the following deficits and impairments:  Decreased skin integrity, Decreased knowledge of use of DME, Increased fascial restricitons, Pain, Postural dysfunction, Decreased scar mobility, Decreased range of motion, Decreased strength, Impaired UE functional use, Obesity, Increased edema, Decreased knowledge of precautions  Visit Diagnosis: Postmastectomy lymphedema  Stiffness of right shoulder, not elsewhere classified  Stiffness of left shoulder joint  Muscle weakness (generalized)  Aftercare following surgery for neoplasm     Problem List Patient Active Problem List   Diagnosis Date Noted  . Carcinoma of axillary tail of breast, right ([place]) 09/15/2018  . Cancer of left female breast  ([place]) 07/20/2018  . Bilateral lower extremity edema 06/15/2018  . 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 Shortness of breath on exertion 06/15/2018  . Chronic diastolic heart failure ([place]) 06/15/2018  . Port-A-Cath in place 04/25/2018  . Hypertensive retinopathy 09/09/2017  . Elevated TSH 08/15/2015  . Prolonged Q-T interval on ECG 08/14/2015  . Diabetic retinopathy ([place]) 03/26/2015  . Abnormality of gait 01/06/2013  . CTS (carpal tunnel syndrome) 03/09/2012  . Urinary frequency 07/14/2011  . Chronic venous insufficiency   . Routine health maintenance 03/09/2011  . CAD (  coronary artery disease)   . Benign essential HTN   . Hyperlipidemia   . Colonic polyp   . Morbid obesity ([place])   . Chronic pain   . Malignant neoplasm of lower-inner quadrant of left breast in female, estrogen receptor positive ([place])   . Iron deficiency anemia   . Seasonal allergies   . OSA (obstructive sleep apnea) 02/16/2007  . Type II diabetes mellitus 10/04/2004    Shan Levans, PT 09/22/2018, 10:17 AM  [place],  [place], [address] Phone: [phone]   Fax:  [phone]  Name: [name] MRN: [id number] Date of Birth: [birth year]

## 2018-09-25 ENCOUNTER — Ambulatory Visit: Payer: Medicare Other

## 2018-09-25 DIAGNOSIS — M6281 Muscle weakness (generalized): Secondary | ICD-10-CM | POA: Diagnosis not present

## 2018-09-25 DIAGNOSIS — M25612 Stiffness of left shoulder, not elsewhere classified: Secondary | ICD-10-CM

## 2018-09-25 DIAGNOSIS — M25611 Stiffness of right shoulder, not elsewhere classified: Secondary | ICD-10-CM

## 2018-09-25 DIAGNOSIS — Z483 Aftercare following surgery for neoplasm: Secondary | ICD-10-CM

## 2018-09-25 DIAGNOSIS — I972 Postmastectomy lymphedema syndrome: Secondary | ICD-10-CM | POA: Diagnosis not present

## 2018-09-25 NOTE — Therapy (Signed)
[place], [place], [address] Phone: [phone]   Fax:  [phone]  Physical Therapy Treatment  Patient Details  Name: TANISA LAGACE MRN: [id number] Date of Birth: 1951/01/27 Referring Provider (PT): Dr. Marlou Starks    Encounter Date: 09/25/2018  PT End of Session - 09/25/18 1102    Visit Number  8    Number of Visits  25    Date for PT Re-Evaluation  11/06/17    PT Start Time  1020    PT Stop Time  1104    PT Time Calculation (min)  44 min    Activity Tolerance  Patient tolerated treatment well    Behavior During Therapy  [facility] for tasks assessed/performed       Past Medical History:  Diagnosis Date  . Arthritis    "knees, lower back" (08/18/2017)  . 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 S/p lumpectomy and radiation.  Marland Kitchen CAD (coronary artery disease) 11/07   cath 11/11 :  3V CADsee report   . Cataract    small both eyes  . Chronic lower back pain   . Chronic pain    MR 08/07/10 :  Spondylosis L4-5 with B foraminal narrowing and L4 nerve root enchroachment B.  . Chronic venous insufficiency 2012   Has had full W/U incl ECHO, LFT's, Cr, Umicro, TSH, BNP. Symptomatic treatment.  . Colonic polyp 1995   Colonoscopies 1994, 2000, and 02/02/2011. Last had 9 polyps - Tubular adenoma and tubulovillous was the pathology results without high grade dysplasia  . Diabetes mellitus type II    Insulin dependent. Has worked with [name] R previously.  . Diastolic CHF, chronic ([place])    NL EF by echo 01/2012, Gr I dd  . Essential hypertension    Requires 5 drug therapy and still difficult to control.  Marland Kitchen GERD (gastroesophageal reflux disease)   . Heart murmur   . Hyperlipidemia    On daily statin  . Iron deficiency anemia    Ferritin was 12 in 2012. on FeSo4. Has had colon and EGD 02/02/11 that showed mild gastritis and colon polyps.  . Obesity    Morbid. HAs  worked with [name] T previously.  . OSA (obstructive sleep apnea) 02/16/2007   02/2007 : Moderate AHI 35.6, O2 sat decreased to 65%, CPAP titration to 16 with AHI of 3.6. 02/2014 : Rare respiratory events with sleep disturbance, within normal limits. AHI 0.4 per hour      . OSA on CPAP   . Personal history of chemotherapy   . Personal history of radiation therapy    "to my left breast"  . Refusal of blood transfusions as patient is Jehovah's Witness   . Seasonal allergies    "grass pollen"    Past Surgical History:  Procedure Laterality Date  . BREAST LUMPECTOMY Left 2006  . BREAST LUMPECTOMY W/ NEEDLE LOCALIZATION  2006   34 Chemo RX  . CARDIAC CATHETERIZATION N/A 08/15/2015   Procedure: Left Heart Cath and Coronary Angiography;  Surgeon: Troy Sine, MD;  Location: [facility];  Service: Cardiovascular;  Laterality: N/A;  . COLONOSCOPY W/ BIOPSIES AND POLYPECTOMY  1994, 2000, 2012, 26/018  . IR IMAGING GUIDED PORT INSERTION  04/17/2018  . LEFT HEART CATH AND CORONARY ANGIOGRAPHY N/A 08/19/2017   Procedure: LEFT HEART CATH AND CORONARY ANGIOGRAPHY;  Surgeon: Belva Crome, MD;  Location: [facility];  Service: Cardiovascular;  Laterality: N/A;  . MASTECTOMY MODIFIED RADICAL Bilateral 07/20/2018  . MASTECTOMY MODIFIED RADICAL Bilateral 07/20/2018   Procedure: BILATERAL MODIFIED RADICAL MASTECTOMIES;  Surgeon: Jovita Kussmaul, MD;  Location: [facility];  Service: General;  Laterality: Bilateral;  . PORTACATH PLACEMENT N/A 04/14/2018   Procedure: ATTEMPED INSERTION PORT-A-CATH;  Surgeon: Jovita Kussmaul, MD;  Location: [facility];  Service: General;  Laterality: N/A;  . TONSILLECTOMY AND ADENOIDECTOMY  1964  . TOTAL ABDOMINAL HYSTERECTOMY  1985   "I had endometrosis"  . ULTRASOUND GUIDANCE FOR VASCULAR ACCESS  08/19/2017   Procedure: Ultrasound Guidance For Vascular Access;  Surgeon: Belva Crome, MD;  Location: [facility];  Service: Cardiovascular;;    There were no  vitals filed for this visit.  Subjective Assessment - 09/25/18 1025    Subjective  I got some soft cotton sports bras and I've been wearing those day and night. they have been helping alot. they hug my "love handles" (extra tissue at lateral trunk areas) and they aren't as swollen now. My nightiime SOB has ot returned either so that's good. The swelling at my Rt chest wall near sternum seems to be filling up so I plan to talk about my doctor about that. My daughter is going to help me order the pulleys off [place].                        [facility] Adult PT Treatment/Exercise - 09/25/18 0001      Shoulder Exercises: Pulleys   Flexion  2 minutes    Flexion Limitations  Tactile and VCs to decrease bil scapular compensations    ABduction  2 minutes    ABduction Limitations  VCs to decrease scapular compensation of Rt>Lt      Manual Therapy   Manual Lymphatic Drainage (MLD)  short neck, superficial and deep abdominals, right inguinal nodes and establishment of Rt axillo inguinal pathway, right chest and lateral trunk moving fluid towards pathway    Passive ROM  in supine to Rt and Lt UEs to tolerance into flexion and abduction               PT Short Term Goals - 08/02/18 2012      PT SHORT TERM GOAL #1   Title  Pt will report feelings of fullness in shoulders and trunk are decreased by at least 50% with manual lymph draiange and compression    Time  3    Period  Weeks    Status  New        PT Long Term Goals - 09/11/18 1217      PT LONG TERM GOAL #1   Title  Pt will have at least 125 degrees of both shoulder abduction so that she can receive radiation and get her shirt on  by herself    Time  8    Period  Weeks    Status  New    [place] Date  11/06/18      PT LONG TERM GOAL #2   Title  Pt reports she has enough left and right shoulder strength so that she can get her dishes to the bottom shelf of the kitchen cabinet to prepare meals at home     Time  8     Period  Weeks    Status  New    Target Date  11/06/18      PT LONG TERM GOAL #3   Title  Pt will verbalize she has an understanding of lymphedema risk reduction practices    Time  8    Period  Weeks    Status  New    Target Date  11/06/18      PT LONG TERM GOAL #4   Title  Pt will obtain appropriate compression garments for long term managment of lymphedema of BUE and chest.    Time  8    Period  Weeks    Status  New    Target Date  11/06/18      PT LONG TERM GOAL #5   Title  Pt will report a 70% improvement in feelings of tightness and discomfort across chest to allow improved comfort.     Time  8    Period  Weeks    Status  New    Target Date  11/06/18            Plan - 09/25/18 1103    Clinical Impression Statement  Focused on manual therapy as end ROM tightness and then manual lymph drainage to Rt chest wall as this was pts biggest complaint today. Also continued with use of pulleys as pt reports these feel very helpful and she plans to order her own in next day or so. She reports feeling much better at end of session. She also reported as she was leaving unsure of when she will be back as she starts radiation nesxt week and will need to figure out transportation.    Rehab Potential  Good    Clinical Impairments Affecting Rehab Potential  pt will be starting radiation    PT Frequency  3x / week    PT Duration  8 weeks    PT Treatment/Interventions  ADLs/Self Care Home Management;Therapeutic activities;Patient/family education;Manual lymph drainage;DME Instruction;Neuromuscular re-education;Orthotic Fit/Training;Therapeutic exercise;Manual techniques;Passive range of motion;Taping;Compression bandaging;Scar mobilization    PT Next Visit Plan  Remeasure ROM next; continue MLD as needed trunk and UEs, shoulder ROM    Consulted and Agree with Plan of Care  Patient       Patient will benefit from skilled therapeutic intervention in order to improve the following deficits  and impairments:  Decreased skin integrity, Decreased knowledge of use of DME, Increased fascial restricitons, Pain, Postural dysfunction, Decreased scar mobility, Decreased range of motion, Decreased strength, Impaired UE functional use, Obesity, Increased edema, Decreased knowledge of precautions  Visit Diagnosis: Postmastectomy lymphedema  Stiffness of right shoulder, not elsewhere classified  Stiffness of left shoulder joint  Muscle weakness (generalized)  Aftercare following surgery for neoplasm     Problem List Patient Active Problem List   Diagnosis Date Noted  . Carcinoma of axillary tail of breast, right ([place]) 09/15/2018  . Cancer of left female breast  ([place]) 07/20/2018  . Bilateral lower extremity edema 06/15/2018  . 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 Shortness of breath on exertion 06/15/2018  . Chronic diastolic heart failure ([place]) 06/15/2018  . Port-A-Cath in place 04/25/2018  . Hypertensive retinopathy 09/09/2017  . Elevated TSH 08/15/2015  . Prolonged Q-T interval on ECG 08/14/2015  . Diabetic retinopathy ([place]) 03/26/2015  . Abnormality of gait 01/06/2013  . CTS (carpal tunnel syndrome) 03/09/2012  . Urinary frequency 07/14/2011  . Chronic venous insufficiency   . Routine health maintenance 03/09/2011  . CAD (coronary artery disease)   . Benign essential HTN   . Hyperlipidemia   . Colonic polyp   . Morbid obesity ([place])   . Chronic pain   . Malignant neoplasm  of lower-inner quadrant of left breast in female, estrogen receptor positive ([place])   . Iron deficiency anemia   . Seasonal allergies   . OSA (obstructive sleep apnea) 02/16/2007  . Type II diabetes mellitus 10/04/2004    Otelia Limes, PTA 09/25/2018, 11:07 AM  [place], [place], [address] Phone: [phone]   Fax:  [phone]  Name: ZAINAH STEVEN MRN: [id number] Date of Birth:  August 10, 1951

## 2018-09-26 ENCOUNTER — Ambulatory Visit: Payer: Medicare Other

## 2018-09-28 DIAGNOSIS — Z510 Encounter for antineoplastic radiation therapy: Secondary | ICD-10-CM | POA: Diagnosis not present

## 2018-09-28 DIAGNOSIS — Z170 Estrogen receptor positive status [ER+]: Secondary | ICD-10-CM | POA: Diagnosis not present

## 2018-09-28 DIAGNOSIS — C50312 Malignant neoplasm of lower-inner quadrant of left female breast: Secondary | ICD-10-CM | POA: Diagnosis not present

## 2018-09-29 ENCOUNTER — Ambulatory Visit
Admission: RE | Admit: 2018-09-29 | Discharge: 2018-09-29 | Disposition: A | Discharge status: Home / Self Care | Payer: Medicare Other | Source: Ambulatory Visit | Attending: Radiation Oncology | Admitting: Radiation Oncology

## 2018-09-29 ENCOUNTER — Encounter

## 2018-09-29 DIAGNOSIS — C50312 Malignant neoplasm of lower-inner quadrant of left female breast: Secondary | ICD-10-CM | POA: Diagnosis not present

## 2018-09-29 DIAGNOSIS — Z510 Encounter for antineoplastic radiation therapy: Secondary | ICD-10-CM | POA: Diagnosis not present

## 2018-09-29 DIAGNOSIS — Z170 Estrogen receptor positive status [ER+]: Secondary | ICD-10-CM | POA: Diagnosis not present

## 2018-10-02 ENCOUNTER — Encounter: Payer: Medicare Other | Admitting: Rehabilitation

## 2018-10-02 ENCOUNTER — Ambulatory Visit
Admission: RE | Admit: 2018-10-02 | Discharge: 2018-10-02 | Disposition: A | Discharge status: Home / Self Care | Payer: Medicare Other | Source: Ambulatory Visit | Attending: Radiation Oncology | Admitting: Radiation Oncology

## 2018-10-02 DIAGNOSIS — Z170 Estrogen receptor positive status [ER+]: Principal | ICD-10-CM

## 2018-10-02 DIAGNOSIS — Z510 Encounter for antineoplastic radiation therapy: Secondary | ICD-10-CM | POA: Diagnosis not present

## 2018-10-02 DIAGNOSIS — C50312 Malignant neoplasm of lower-inner quadrant of left female breast: Secondary | ICD-10-CM | POA: Diagnosis not present

## 2018-10-02 MED ORDER — ALRA NON-METALLIC DEODORANT (RAD-ONC)
1.0000 "application " | Freq: Once | TOPICAL | Status: AC
  Administered 2018-10-02: 1 via TOPICAL

## 2018-10-02 MED ORDER — RADIAPLEXRX EX GEL
Freq: Once | CUTANEOUS | Status: AC
  Administered 2018-10-02: 18:00:00 via TOPICAL

## 2018-10-02 NOTE — Progress Notes (Signed)

## 2018-10-03 ENCOUNTER — Ambulatory Visit
Admission: RE | Admit: 2018-10-03 | Discharge: 2018-10-03 | Disposition: A | Discharge status: Home / Self Care | Payer: Medicare Other | Source: Ambulatory Visit | Attending: Radiation Oncology | Admitting: Radiation Oncology

## 2018-10-03 DIAGNOSIS — C50312 Malignant neoplasm of lower-inner quadrant of left female breast: Secondary | ICD-10-CM | POA: Diagnosis not present

## 2018-10-03 DIAGNOSIS — Z510 Encounter for antineoplastic radiation therapy: Secondary | ICD-10-CM | POA: Diagnosis not present

## 2018-10-03 DIAGNOSIS — Z170 Estrogen receptor positive status [ER+]: Secondary | ICD-10-CM | POA: Diagnosis not present

## 2018-10-05 ENCOUNTER — Ambulatory Visit
Admission: RE | Admit: 2018-10-05 | Discharge: 2018-10-05 | Disposition: A | Discharge status: Home / Self Care | Payer: Medicare Other | Source: Ambulatory Visit | Attending: Radiation Oncology | Admitting: Radiation Oncology

## 2018-10-05 DIAGNOSIS — Z510 Encounter for antineoplastic radiation therapy: Secondary | ICD-10-CM | POA: Insufficient documentation

## 2018-10-05 DIAGNOSIS — Z170 Estrogen receptor positive status [ER+]: Secondary | ICD-10-CM | POA: Insufficient documentation

## 2018-10-05 DIAGNOSIS — C50312 Malignant neoplasm of lower-inner quadrant of left female breast: Secondary | ICD-10-CM | POA: Insufficient documentation

## 2018-10-06 ENCOUNTER — Ambulatory Visit: Payer: Medicare Other | Admitting: Dietician

## 2018-10-06 ENCOUNTER — Ambulatory Visit
Admission: RE | Admit: 2018-10-06 | Discharge: 2018-10-06 | Disposition: A | Discharge status: Home / Self Care | Payer: Medicare Other | Source: Ambulatory Visit | Attending: Radiation Oncology | Admitting: Radiation Oncology

## 2018-10-06 DIAGNOSIS — Z170 Estrogen receptor positive status [ER+]: Secondary | ICD-10-CM | POA: Diagnosis not present

## 2018-10-06 DIAGNOSIS — C50312 Malignant neoplasm of lower-inner quadrant of left female breast: Secondary | ICD-10-CM | POA: Diagnosis not present

## 2018-10-06 DIAGNOSIS — Z510 Encounter for antineoplastic radiation therapy: Secondary | ICD-10-CM | POA: Diagnosis not present

## 2018-10-09 ENCOUNTER — Ambulatory Visit
Admission: RE | Admit: 2018-10-09 | Discharge: 2018-10-09 | Disposition: A | Discharge status: Home / Self Care | Payer: Medicare Other | Source: Ambulatory Visit | Attending: Radiation Oncology | Admitting: Radiation Oncology

## 2018-10-09 DIAGNOSIS — Z170 Estrogen receptor positive status [ER+]: Secondary | ICD-10-CM | POA: Diagnosis not present

## 2018-10-09 DIAGNOSIS — Z510 Encounter for antineoplastic radiation therapy: Secondary | ICD-10-CM | POA: Diagnosis not present

## 2018-10-09 DIAGNOSIS — C50312 Malignant neoplasm of lower-inner quadrant of left female breast: Secondary | ICD-10-CM | POA: Diagnosis not present

## 2018-10-10 ENCOUNTER — Telehealth: Payer: Self-pay | Admitting: Dietician

## 2018-10-10 ENCOUNTER — Ambulatory Visit
Admission: RE | Admit: 2018-10-10 | Discharge: 2018-10-10 | Disposition: A | Discharge status: Home / Self Care | Payer: Medicare Other | Source: Ambulatory Visit | Attending: Radiation Oncology | Admitting: Radiation Oncology

## 2018-10-10 DIAGNOSIS — Z510 Encounter for antineoplastic radiation therapy: Secondary | ICD-10-CM | POA: Diagnosis not present

## 2018-10-10 DIAGNOSIS — Z170 Estrogen receptor positive status [ER+]: Secondary | ICD-10-CM | POA: Diagnosis not present

## 2018-10-10 DIAGNOSIS — C50312 Malignant neoplasm of lower-inner quadrant of left female breast: Secondary | ICD-10-CM | POA: Diagnosis not present

## 2018-10-10 NOTE — Telephone Encounter (Signed)
Patient calls to report blood sugars of 200s in the past 3 days.Patient reports a blood sugar of 135 this morning. She reports taking 3 units of Novolog with meals. She wants to know if she should increase her mealtime insulin.

## 2018-10-10 NOTE — Telephone Encounter (Signed)
Called [name] back for more information about her blood sugars. She wanted to schedule an appointment for tomorrow instead.

## 2018-10-11 ENCOUNTER — Ambulatory Visit: Payer: Medicare Other | Admitting: Dietician

## 2018-10-11 ENCOUNTER — Ambulatory Visit
Admission: RE | Admit: 2018-10-11 | Discharge: 2018-10-11 | Disposition: A | Discharge status: Home / Self Care | Payer: Medicare Other | Source: Ambulatory Visit | Attending: Radiation Oncology | Admitting: Radiation Oncology

## 2018-10-11 ENCOUNTER — Encounter: Payer: Self-pay | Admitting: Dietician

## 2018-10-11 DIAGNOSIS — Z510 Encounter for antineoplastic radiation therapy: Secondary | ICD-10-CM | POA: Diagnosis not present

## 2018-10-11 DIAGNOSIS — Z170 Estrogen receptor positive status [ER+]: Secondary | ICD-10-CM | POA: Diagnosis not present

## 2018-10-11 DIAGNOSIS — E1140 Type 2 diabetes mellitus with diabetic neuropathy, unspecified: Secondary | ICD-10-CM

## 2018-10-11 DIAGNOSIS — C50312 Malignant neoplasm of lower-inner quadrant of left female breast: Secondary | ICD-10-CM | POA: Diagnosis not present

## 2018-10-11 DIAGNOSIS — Z794 Long term (current) use of insulin: Principal | ICD-10-CM

## 2018-10-11 MED ORDER — INSULIN ASPART 100 UNIT/ML FLEXPEN: PEN_INJECTOR | SUBCUTANEOUS | 1 refills | Status: DC

## 2018-10-11 NOTE — Patient Instructions (Signed)
This is a reminder to write your blood sugars down so that when you call me you have them right in front of you. I will send you a log in the mail so that you can write down your blood sugars on it. I will write on the log what days you can call me, with my telephone number.   [name] [phone]

## 2018-10-11 NOTE — Progress Notes (Signed)
Documentation:  Patient came in for meter download for possible insulin adjustment. Patient reports that her diet is not affecting her blood sugars.   METER DOWNLOAD  Report summary is from last 30 days,  Average tests per day: 1.8 Average blood glucose: 202 Range: minimum: 118 and maximum: 346 % in target range: 31 % below target range: 0 % above target range: 69 % hypoglycemia: 0 Notes about patterns: Most of her blood glucose testing occurred in the mornings, with highest blood glucose readings occurring mostly after dinner. Fasting average is 195, lunch average is 196 and dinner average is 219.   Gave patient her meter readings. Increased prandial insulin to 4 units before each meal 3 times a day. Patient to  report back in two weeks with record blood sugars for most recent 3 days.   [name], RD 10/11/2018 4:23 PM.

## 2018-10-12 ENCOUNTER — Ambulatory Visit
Admission: RE | Admit: 2018-10-12 | Discharge: 2018-10-12 | Disposition: A | Discharge status: Home / Self Care | Payer: Medicare Other | Source: Ambulatory Visit | Attending: Radiation Oncology | Admitting: Radiation Oncology

## 2018-10-12 DIAGNOSIS — Z510 Encounter for antineoplastic radiation therapy: Secondary | ICD-10-CM | POA: Diagnosis not present

## 2018-10-12 DIAGNOSIS — Z170 Estrogen receptor positive status [ER+]: Secondary | ICD-10-CM | POA: Diagnosis not present

## 2018-10-12 DIAGNOSIS — C50312 Malignant neoplasm of lower-inner quadrant of left female breast: Secondary | ICD-10-CM | POA: Diagnosis not present

## 2018-10-13 ENCOUNTER — Ambulatory Visit
Admission: RE | Admit: 2018-10-13 | Discharge: 2018-10-13 | Disposition: A | Discharge status: Home / Self Care | Payer: Medicare Other | Source: Ambulatory Visit | Attending: Radiation Oncology | Admitting: Radiation Oncology

## 2018-10-13 DIAGNOSIS — Z510 Encounter for antineoplastic radiation therapy: Secondary | ICD-10-CM | POA: Diagnosis not present

## 2018-10-13 DIAGNOSIS — Z170 Estrogen receptor positive status [ER+]: Secondary | ICD-10-CM | POA: Diagnosis not present

## 2018-10-13 DIAGNOSIS — C50312 Malignant neoplasm of lower-inner quadrant of left female breast: Secondary | ICD-10-CM | POA: Diagnosis not present

## 2018-10-16 ENCOUNTER — Ambulatory Visit
Admission: RE | Admit: 2018-10-16 | Discharge: 2018-10-16 | Disposition: A | Discharge status: Home / Self Care | Payer: Medicare Other | Source: Ambulatory Visit | Attending: Radiation Oncology | Admitting: Radiation Oncology

## 2018-10-16 DIAGNOSIS — C50312 Malignant neoplasm of lower-inner quadrant of left female breast: Secondary | ICD-10-CM | POA: Diagnosis not present

## 2018-10-16 DIAGNOSIS — C50611 Malignant neoplasm of axillary tail of right female breast: Secondary | ICD-10-CM

## 2018-10-16 DIAGNOSIS — Z510 Encounter for antineoplastic radiation therapy: Secondary | ICD-10-CM | POA: Diagnosis not present

## 2018-10-16 DIAGNOSIS — Z170 Estrogen receptor positive status [ER+]: Secondary | ICD-10-CM | POA: Diagnosis not present

## 2018-10-16 MED ORDER — SONAFINE EX EMUL
1.0000 "application " | Freq: Two times a day (BID) | CUTANEOUS | Status: DC
  Administered 2018-10-16: 1 via TOPICAL

## 2018-10-17 ENCOUNTER — Ambulatory Visit
Admission: RE | Admit: 2018-10-17 | Discharge: 2018-10-17 | Disposition: A | Discharge status: Home / Self Care | Payer: Medicare Other | Source: Ambulatory Visit | Attending: Radiation Oncology | Admitting: Radiation Oncology

## 2018-10-17 ENCOUNTER — Ambulatory Visit: Admission: RE | Admit: 2018-10-17 | Payer: Medicare Other | Source: Ambulatory Visit

## 2018-10-17 DIAGNOSIS — Z170 Estrogen receptor positive status [ER+]: Secondary | ICD-10-CM | POA: Diagnosis not present

## 2018-10-17 DIAGNOSIS — Z510 Encounter for antineoplastic radiation therapy: Secondary | ICD-10-CM | POA: Diagnosis not present

## 2018-10-17 DIAGNOSIS — C50312 Malignant neoplasm of lower-inner quadrant of left female breast: Secondary | ICD-10-CM | POA: Diagnosis not present

## 2018-10-18 ENCOUNTER — Ambulatory Visit
Admission: RE | Admit: 2018-10-18 | Discharge: 2018-10-18 | Disposition: A | Discharge status: Home / Self Care | Payer: Medicare Other | Source: Ambulatory Visit | Attending: Radiation Oncology | Admitting: Radiation Oncology

## 2018-10-18 DIAGNOSIS — C50312 Malignant neoplasm of lower-inner quadrant of left female breast: Secondary | ICD-10-CM | POA: Diagnosis not present

## 2018-10-18 DIAGNOSIS — Z510 Encounter for antineoplastic radiation therapy: Secondary | ICD-10-CM | POA: Diagnosis not present

## 2018-10-18 DIAGNOSIS — Z170 Estrogen receptor positive status [ER+]: Secondary | ICD-10-CM | POA: Diagnosis not present

## 2018-10-18 NOTE — Progress Notes (Signed)
Patient Care Team: Bartholomew Crews, MD as PCP - General (Internal Medicine) Hayden Pedro, MD as Consulting Physician (Ophthalmology) Shirley Muscat Loreen Freud, MD as Referring Physician (Optometry) Minus Breeding, MD as Consulting Physician (Cardiology)  DIAGNOSIS: No diagnosis found.  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 S/p lumpectomy and radiation.    03/21/2018 Initial Diagnosis    [place] skin anterior left breast: Ultrasound 7 o'clock position 2 cm from nipple irregular hypoechoic mass 2.2 x 2.5 x 2.5 cm (1 cm medial to prior lumpectomy); 12:00: Hypoechoic mass extending to skin 3.9 x 2.9 x 3.6 cm market skin thickening, no axillary lymph nodes    03/21/2018 Initial Biopsy    Left breast 12 o'clock position: IDC grade 3; 7 o'clock position: IDC grade 3, ER 40%, PR 0%, Ki-67 80%, HER-2 equivocal by FISH ratio 1.17 copy #4.4, IHC 1+ negative, T4d N0 stage IIIc clinical stage     04/20/2018 Imaging    IMPRESSION: 1. Multiple masses within the left breast (inflammatory breast carcinoma.) 2. Cortically thickened right axillary lymph node, indeterminate. Metastatic disease not excluded (patient refused biopsy)    05/09/2018 - 06/20/2018 Neo-Adjuvant Chemotherapy    Neoadjuvant chemotherapy with TC X 4    07/20/2018 Surgery    Bilateral mastectomies: Left mastectomy: Grade 3 IDC 6.5 cm, 1/2 lymph nodes positive, ER 40%, PR 0%, HER-2 negative, Ki-67 80%; right axillary lymph node 7/12 LN pos ER 0%, PR 0%, HER-2 negative, Ki-67 90% RCB class III; right mastectomy: Benign    08/02/2018 Cancer Staging    Staging form: Breast, AJCC 8th Edition - Clinical: Stage IIIC (cT4d, cN0, cM0, G3, ER+, PR-, HER2-) - Signed by Gardenia Phlegm, NP on 08/02/2018    08/02/2018 Cancer Staging    Staging  form: Breast, AJCC 8th Edition - Pathologic: No Stage Recommended (ypT4d, pN1a, cM0, G3, ER-, PR-, HER2-) - Signed by Eppie Gibson, MD on 09/15/2018    10/02/2018 -  Radiation Therapy    Adjuvant XRT     Carcinoma of axillary tail of breast, right ([place])   09/15/2018 Initial Diagnosis    Carcinoma of axillary tail of breast, right ([place])    09/15/2018 Cancer Staging    Staging form: Breast, AJCC 8th Edition - Pathologic: No Stage Recommended (ypT0, pN2a, cM0, G3, ER-, PR-, HER2-) - Signed by Eppie Gibson, MD on 09/15/2018     CHIEF COMPLIANT: Follow-up during radiation therapy  INTERVAL HISTORY: [name] is a 68 y.o. with above-mentioned history of 2006 left breast cancer treated with lumpectomy and radiation who presented with inflammatory changes in the left breast as well as enlarged right axillary lymph nodes. She underwent neoadjuvant chemotherapy with Taxotere Cytoxan and then underwent bilateral mastectomies. She is currently undergoing adjuvant radiation therapy, which began on 10/02/18, and is on Xeloda. She presents to the clinic today with two daughters. She reports radiation is not going well. She is fatigued, has discomfort and burning at the radiation site as well as hardening of the skin, and shortness of breath. She denies any diarrhea or rashes. She reports swelling in her chest at the scar tissue that is very tender.   REVIEW OF SYSTEMS:   Constitutional: Denies fevers, chills or abnormal weight loss (+) fatigue Eyes: Denies blurriness of vision Ears, nose, mouth, throat, and face:  Denies mucositis or sore throat Respiratory: Denies cough or wheezes (+) dyspnea Cardiovascular: Denies palpitation, (+) chest swelling and tenderness Gastrointestinal:  Denies nausea, heartburn or change in bowel habits Skin: Denies abnormal skin rashes  Lymphatics: Denies new lymphadenopathy or easy bruising Neurological: Denies numbness, tingling or new  weaknesses Behavioral/Psych: Mood is stable, no new changes  Extremities: No lower extremity edema Breast: denies any lumps or nodules in either breasts (+) discomfort, pain, burning at radiation site (+) hardening of skin at radiation site All other systems were reviewed with the patient and are negative.  I have reviewed the past medical history, past surgical history, social history and family history with the patient and they are unchanged from previous note.  ALLERGIES:  is allergic to lisinopril; oxycodone-acetaminophen; percocet [oxycodone-acetaminophen]; victoza [liraglutide]; and atorvastatin.  MEDICATIONS:  Current Outpatient Medications  Medication Sig Dispense Refill  . ACCU-CHEK FASTCLIX LANCETS MISC Check blood sugar 3 times a day 102 each 5  . ACCU-CHEK GUIDE test strip USE 1  THREE TIMES DAILY 375 each 1  . amLODipine (NORVASC) 10 MG tablet TAKE ONE (1) TABLET BY MOUTH EVERY DAY (Patient taking differently: Take 10 mg by mouth daily. ) 90 tablet 3  . aspirin EC 81 MG tablet Take 81 mg by mouth daily.    . capecitabine (XELODA) 500 MG tablet Take 2 tablets (1,000 mg total) by mouth 2 (two) times daily after a meal. Take on radiation days only, M-F 132 tablet 0  . chlorpheniramine-HYDROcodone (TUSSIONEX PENNKINETIC ER) 10-8 MG/5ML SUER Take 5 mLs by mouth 2 (two) times daily. 140 mL 0  . Coenzyme Q10 (CO Q 10) 100 MG CAPS Take 100 mg by mouth daily.     Marland Kitchen dexamethasone (DECADRON) 4 MG tablet Take 1 tablet (4 mg total) by mouth daily. Take 1 tablet day before chemo and 1 tablet day after chemo 10 tablet 0  . ezetimibe (ZETIA) 10 MG tablet TAKE ONE (1) TABLET BY MOUTH EVERY DAY 30 tablet 7  . furosemide (LASIX) 20 MG tablet TAKE 1 TABLET (20 MG TOTAL) BY MOUTH AS NEEDED FOR EDEMA. (Patient taking differently: Take 20 mg by mouth daily as needed for edema. ) 30 tablet 2  . gabapentin (NEURONTIN) 300 MG capsule Take 1 capsule (300 mg total) by mouth at bedtime. 30 capsule 2  .  insulin aspart (NOVOLOG FLEXPEN) 100 UNIT/ML FlexPen Inject 4 units before each meal 3 times daily. (Patient taking differently: Inject 5 units before each meal 3 times daily.) 15 mL 1  . insulin aspart protamine- aspart (NOVOLOG MIX 70/30) (70-30) 100 UNIT/ML injection Inject 2-5 Units into the skin See admin instructions. Inject per sliding scale 201-250 = 2 units, 251-300 = 3 units, 301-400 = 4 units, >401 = 5 units and call MD SQ 3 times daily.    . Insulin Glargine (TOUJEO SOLOSTAR) 300 UNIT/ML SOPN Inject 30 Units into the skin daily. 12 mL 3  . Insulin Pen Needle 32G X 4 MM MISC USE FOR INJECTIONS up to 4 times daily. Diagnosis code E11.40, Z79.4 200 each 5  . isosorbide mononitrate (IMDUR) 120 MG 24 hr tablet Take 2 tablets (240 mg total) by mouth every morning. (Patient taking differently: Take 120 mg by mouth 2 (two) times daily. ) 30 tablet 11  . lidocaine-prilocaine (EMLA) cream Apply to affected area once 30 g 3  . loratadine (CLARITIN) 10 MG tablet Take 10 mg by mouth daily as needed for allergies.    Marland Kitchen losartan (COZAAR)  50 MG tablet TAKE ONE (1) TABLET BY MOUTH EVERY DAY (Patient taking differently: Take 50 mg by mouth daily. ) 90 tablet 3  . meloxicam (MOBIC) 7.5 MG tablet Take 1 tablet (7.5 mg total) by mouth daily as needed for pain. 90 tablet 0  . metFORMIN (GLUCOPHAGE) 1000 MG tablet TAKE ONE (1) TABLET BY MOUTH TWO (2) TIMES DAILY WITH A MEAL (Patient taking differently: Take 1,000 mg by mouth 2 (two) times daily with a meal. ) 180 tablet 3  . methocarbamol (ROBAXIN) 750 MG tablet Take 1 tablet (750 mg total) by mouth every 6 (six) hours as needed for muscle spasms. 30 tablet 1  . metoprolol succinate (TOPROL-XL) 100 MG 24 hr tablet TAKE ONE (1) TABLET BY MOUTH EVERY DAY (Patient taking differently: Take 100 mg by mouth daily. ) 90 tablet 3  . nitroGLYCERIN (NITRODUR - DOSED IN MG/24 HR) 0.4 mg/hr patch APPLY ONE PATCH ON SKIN EVERY DAY. LEAVE ON FOR 12 HOURS AND OFF FOR 12 HOURS.  100 patch 3  . nitroGLYCERIN (NITROSTAT) 0.4 MG SL tablet PLACE 1 TABLET UNDER THE TONGUE EVERY 5 MINUTES AS NEEDED FOR CHEST PAIN 25 tablet 7  . Olopatadine HCl (PATADAY) 0.2 % SOLN Place 1 drop into both eyes daily as needed (for allergies).     Marland Kitchen omeprazole (PRILOSEC) 40 MG capsule Take 1 capsule (40 mg total) by mouth daily. (Patient taking differently: Take 40 mg by mouth daily as needed (for acid reflux). ) 30 capsule 5  . ondansetron (ZOFRAN) 8 MG tablet Take 1 tablet (8 mg total) by mouth 2 (two) times daily as needed for refractory nausea / vomiting. Start on day 3 after chemo. 30 tablet 1  . prochlorperazine (COMPAZINE) 10 MG tablet Take 1 tablet (10 mg total) by mouth every 6 (six) hours as needed (Nausea or vomiting). 60 tablet 2  . spironolactone (ALDACTONE) 25 MG tablet Take 1 tablet (25 mg total) by mouth daily. 90 tablet 3  . traMADol (ULTRAM) 50 MG tablet Take 1 tablet (50 mg total) by mouth every 12 (twelve) hours as needed. (Patient taking differently: Take 50 mg by mouth every 12 (twelve) hours as needed for moderate pain. ) 30 tablet 0  . vitamin B-12 (CYANOCOBALAMIN) 1000 MCG tablet Take 1,000 mcg by mouth daily.     No current facility-administered medications for this visit.     PHYSICAL EXAMINATION: ECOG PERFORMANCE STATUS: 2  Vitals:   10/19/18 1307 10/19/18 1313  BP:  (!) 170/70  Pulse: (P) 68 68  Resp: (P) 17 17  Temp: (P) 98.5 F (36.9 C) 98.5 F (36.9 C)  SpO2: (P) 100% 100%   Filed Weights   10/19/18 1313  Weight: 214 lb 8 oz (97.3 kg)    GENERAL: alert, no distress and comfortable SKIN: skin color, texture, turgor are normal, no rashes or significant lesions EYES: normal, Conjunctiva are pink and non-injected, sclera clear OROPHARYNX: no exudate, no erythema and lips, buccal mucosa, and tongue normal  NECK: supple, thyroid normal size, non-tender, without nodularity LYMPH: no palpable lymphadenopathy in the cervical, axillary or inguinal LUNGS:  clear to auscultation and percussion with normal breathing effort HEART: regular rate & rhythm and no murmurs and no lower extremity edema ABDOMEN: abdomen soft, non-tender and normal bowel sounds MUSCULOSKELETAL: no cyanosis of digits and no clubbing  NEURO: alert & oriented x 3 with fluent speech, no focal motor/sensory deficits EXTREMITIES: No lower extremity edema  LABORATORY DATA:  I have reviewed the data  as listed CMP Latest Ref Rng & Units 10/19/2018 09/07/2018 07/20/2018  Glucose 70 - 99 mg/dL 124(H) 138(H) -  BUN 8 - 23 mg/dL 21 15 -  Creatinine 0.44 - 1.00 mg/dL 0.95 0.85 -  Sodium 135 - 145 mmol/L 142 142 139  Potassium 3.5 - 5.1 mmol/L 4.4 3.9 4.5  Chloride 98 - 111 mmol/L 110 107 -  CO2 22 - 32 mmol/L 25 25 -  Calcium 8.9 - 10.3 mg/dL 9.7 9.8 -  Total Protein 6.5 - 8.1 g/dL 7.4 7.5 -  Total Bilirubin 0.3 - 1.2 mg/dL 0.3 <0.2(L) -  Alkaline Phos 38 - 126 U/L 59 74 -  AST 15 - 41 U/L 13(L) 14(L) -  ALT 0 - 44 U/L 12 13 -    Lab Results  Component Value Date   WBC 3.8 (L) 10/19/2018   HGB 10.8 (L) 10/19/2018   HCT 34.5 (L) 10/19/2018   MCV 88.0 10/19/2018   PLT 159 10/19/2018   NEUTROABS 2.2 10/19/2018    ASSESSMENT & PLAN:  No problem-specific Assessment & Plan notes found for this encounter.    No orders of the defined types were placed in this encounter.  The patient has a good understanding of the overall plan. she agrees with it. she will call with any problems that may develop before the next visit here.  Nicholas Lose, MD 10/19/2018  Julious Oka Dorshimer am acting as scribe for Dr. Nicholas Lose.  I have reviewed the above documentation for accuracy and completeness, and I agree with the above.

## 2018-10-19 ENCOUNTER — Ambulatory Visit
Admission: RE | Admit: 2018-10-19 | Discharge: 2018-10-19 | Disposition: A | Discharge status: Home / Self Care | Payer: Medicare Other | Source: Ambulatory Visit | Attending: Radiation Oncology | Admitting: Radiation Oncology

## 2018-10-19 ENCOUNTER — Inpatient Hospital Stay (HOSPITAL_BASED_OUTPATIENT_CLINIC_OR_DEPARTMENT_OTHER): Payer: Medicare Other | Admitting: Hematology and Oncology

## 2018-10-19 ENCOUNTER — Inpatient Hospital Stay: Payer: Medicare Other | Attending: Hematology and Oncology

## 2018-10-19 ENCOUNTER — Telehealth: Payer: Self-pay | Admitting: Dietician

## 2018-10-19 DIAGNOSIS — Z9221 Personal history of antineoplastic chemotherapy: Secondary | ICD-10-CM | POA: Insufficient documentation

## 2018-10-19 DIAGNOSIS — Z7982 Long term (current) use of aspirin: Secondary | ICD-10-CM | POA: Insufficient documentation

## 2018-10-19 DIAGNOSIS — Z794 Long term (current) use of insulin: Secondary | ICD-10-CM

## 2018-10-19 DIAGNOSIS — Z170 Estrogen receptor positive status [ER+]: Secondary | ICD-10-CM | POA: Diagnosis not present

## 2018-10-19 DIAGNOSIS — Z9013 Acquired absence of bilateral breasts and nipples: Secondary | ICD-10-CM

## 2018-10-19 DIAGNOSIS — Z79899 Other long term (current) drug therapy: Secondary | ICD-10-CM

## 2018-10-19 DIAGNOSIS — C50312 Malignant neoplasm of lower-inner quadrant of left female breast: Secondary | ICD-10-CM | POA: Diagnosis not present

## 2018-10-19 DIAGNOSIS — Z510 Encounter for antineoplastic radiation therapy: Secondary | ICD-10-CM | POA: Diagnosis not present

## 2018-10-19 DIAGNOSIS — Z923 Personal history of irradiation: Secondary | ICD-10-CM

## 2018-10-19 DIAGNOSIS — C50611 Malignant neoplasm of axillary tail of right female breast: Secondary | ICD-10-CM | POA: Insufficient documentation

## 2018-10-19 LAB — CBC WITH DIFFERENTIAL (CANCER CENTER ONLY)
Abs Immature Granulocytes: 0 10*3/uL (ref 0.00–0.07)
BASOS PCT: 0 %
Basophils Absolute: 0 10*3/uL (ref 0.0–0.1)
Eosinophils Absolute: 0.1 10*3/uL (ref 0.0–0.5)
Eosinophils Relative: 3 %
HCT: 34.5 % — ABNORMAL LOW (ref 36.0–46.0)
Hemoglobin: 10.8 g/dL — ABNORMAL LOW (ref 12.0–15.0)
Immature Granulocytes: 0 %
Lymphocytes Relative: 30 %
Lymphs Abs: 1.1 10*3/uL (ref 0.7–4.0)
MCH: 27.6 pg (ref 26.0–34.0)
MCHC: 31.3 g/dL (ref 30.0–36.0)
MCV: 88 fL (ref 80.0–100.0)
Monocytes Absolute: 0.4 10*3/uL (ref 0.1–1.0)
Monocytes Relative: 10 %
Neutro Abs: 2.2 10*3/uL (ref 1.7–7.7)
Neutrophils Relative %: 57 %
PLATELETS: 159 10*3/uL (ref 150–400)
RBC: 3.92 MIL/uL (ref 3.87–5.11)
RDW: 18.8 % — ABNORMAL HIGH (ref 11.5–15.5)
WBC Count: 3.8 10*3/uL — ABNORMAL LOW (ref 4.0–10.5)
nRBC: 0 % (ref 0.0–0.2)

## 2018-10-19 LAB — CMP (CANCER CENTER ONLY)
ALBUMIN: 3.6 g/dL (ref 3.5–5.0)
ALK PHOS: 59 U/L (ref 38–126)
ALT: 12 U/L (ref 0–44)
ANION GAP: 7 (ref 5–15)
AST: 13 U/L — ABNORMAL LOW (ref 15–41)
BILIRUBIN TOTAL: 0.3 mg/dL (ref 0.3–1.2)
BUN: 21 mg/dL (ref 8–23)
CALCIUM: 9.7 mg/dL (ref 8.9–10.3)
CO2: 25 mmol/L (ref 22–32)
Chloride: 110 mmol/L (ref 98–111)
Creatinine: 0.95 mg/dL (ref 0.44–1.00)
GLUCOSE: 124 mg/dL — AB (ref 70–99)
Potassium: 4.4 mmol/L (ref 3.5–5.1)
Sodium: 142 mmol/L (ref 135–145)
TOTAL PROTEIN: 7.4 g/dL (ref 6.5–8.1)

## 2018-10-19 NOTE — Telephone Encounter (Signed)
Ms. Janco calls to reports blood sugar on 30 units Toujeo each day and 4 units Novolog 3 times a day before meals:   Date  Breakfast Lunch  Dinner  Bedtime  1/09   189    202  1/10   179    227  1/11  201     200   189   1/12  191      301  1/13   180  142   121  1/14  180  225   200  1/15  211  145   170  1/16  184   138 (after 1 oatmeal raisin cookie, large fry, coffee at [employer]!)  Patient reports no symptoms of low blood sugars.advised to increase her Novolog to 5 units three times daily and to hold Novolog if meal not eaten. We agreed to have her check before bed to determine which insulin needs to be adjusted to better control fasting blood sugars. Reviewed signs and symptoms of hypoglycemia with her and she agreed to call if she has any.  [name], RD 10/19/2018 4:05 PM.

## 2018-10-19 NOTE — Assessment & Plan Note (Signed)
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 S/p lumpectomy and radiation.  03/21/2018:Peau d' Orange skin anterior left breast: Ultrasound 7 o'clock position 2 cm from nipple irregular hypoechoic mass 2.2 x 2.5 x 2.5 cm (1 cm medial to prior lumpectomy); 12:00: Hypoechoic mass extending to skin 3.9 x 2.9 x 3.6 cm market skin thickening, no axillary lymph nodes  Left breast 12 o'clock position: IDC grade 3; 7 o'clock position: IDC grade 3, ER 40%, PR 0%, Ki-67 80%, HER-2 equivocal by FISH ratio 1.17 copy #4.4, IHC 1+ negative, T4d N0 stage IIIc clinical stage  Right breast and axillary ultrasound: Prominent right axillary lymph node.Biopsy revealed metastatic poorly differentiated carcinoma ER 0%, PR 0%, Ki-67 90%  CT CAP 04/20/18:Breast masses and axillary lymph node, no metastatic disease elsewhere ----------------------------------------------------------------------------- Treatment summary: 1. Neoadjuvant chemotherapy withTaxotere Cytoxan x4cycles completed 06/20/2018 (due to cardiac issues she was not a candidate for Adriamycin) 2.  Bilateral mastectomies 07/20/2018 3. Followed by Xeloda-radiation 4. Followed by antiestrogen therapy ---------------------------------------------------------------------------- 07/20/2018: Bilateral mastectomies:  Left mastectomy: Grade 3 IDC 6.5 cm, 1/2 lymph nodes positive, ER 40%, PR 0%, HER-2 negative, Ki-67 80%;  right axillary lymph node 7/12 Positive ER 0%, PR 0%, HER-2 negative, Ki-67 90% RCB class III;  Right mastectomy: Benign ---------------------------------------------------- Current treatment: Xeloda with radiation Return to clinic monthly for lab checks

## 2018-10-20 ENCOUNTER — Ambulatory Visit
Admission: RE | Admit: 2018-10-20 | Discharge: 2018-10-20 | Disposition: A | Discharge status: Home / Self Care | Payer: Medicare Other | Source: Ambulatory Visit | Attending: Radiation Oncology | Admitting: Radiation Oncology

## 2018-10-20 DIAGNOSIS — C50312 Malignant neoplasm of lower-inner quadrant of left female breast: Secondary | ICD-10-CM | POA: Diagnosis not present

## 2018-10-20 DIAGNOSIS — Z510 Encounter for antineoplastic radiation therapy: Secondary | ICD-10-CM | POA: Diagnosis not present

## 2018-10-20 DIAGNOSIS — Z170 Estrogen receptor positive status [ER+]: Secondary | ICD-10-CM | POA: Diagnosis not present

## 2018-10-23 ENCOUNTER — Ambulatory Visit
Admission: RE | Admit: 2018-10-23 | Discharge: 2018-10-23 | Disposition: A | Discharge status: Home / Self Care | Payer: Medicare Other | Source: Ambulatory Visit | Attending: Radiation Oncology | Admitting: Radiation Oncology

## 2018-10-23 DIAGNOSIS — C50312 Malignant neoplasm of lower-inner quadrant of left female breast: Secondary | ICD-10-CM | POA: Diagnosis not present

## 2018-10-23 DIAGNOSIS — Z170 Estrogen receptor positive status [ER+]: Secondary | ICD-10-CM | POA: Diagnosis not present

## 2018-10-23 DIAGNOSIS — Z510 Encounter for antineoplastic radiation therapy: Secondary | ICD-10-CM | POA: Diagnosis not present

## 2018-10-23 MED ORDER — RADIAPLEXRX EX GEL
Freq: Once | CUTANEOUS | Status: AC
  Administered 2018-10-23: 13:00:00 via TOPICAL

## 2018-10-23 MED FILL — CAPECITABINE 500 MG TABS: 500 | 15 days supply | Qty: 44 | Fill #1

## 2018-10-24 ENCOUNTER — Ambulatory Visit
Admission: RE | Admit: 2018-10-24 | Discharge: 2018-10-24 | Disposition: A | Discharge status: Home / Self Care | Payer: Medicare Other | Source: Ambulatory Visit | Attending: Radiation Oncology | Admitting: Radiation Oncology

## 2018-10-24 DIAGNOSIS — C50312 Malignant neoplasm of lower-inner quadrant of left female breast: Secondary | ICD-10-CM | POA: Diagnosis not present

## 2018-10-24 DIAGNOSIS — Z170 Estrogen receptor positive status [ER+]: Secondary | ICD-10-CM | POA: Diagnosis not present

## 2018-10-24 DIAGNOSIS — Z510 Encounter for antineoplastic radiation therapy: Secondary | ICD-10-CM | POA: Diagnosis not present

## 2018-10-25 ENCOUNTER — Ambulatory Visit
Admission: RE | Admit: 2018-10-25 | Discharge: 2018-10-25 | Disposition: A | Discharge status: Home / Self Care | Payer: Medicare Other | Source: Ambulatory Visit | Attending: Radiation Oncology | Admitting: Radiation Oncology

## 2018-10-25 DIAGNOSIS — Z170 Estrogen receptor positive status [ER+]: Secondary | ICD-10-CM | POA: Diagnosis not present

## 2018-10-25 DIAGNOSIS — Z510 Encounter for antineoplastic radiation therapy: Secondary | ICD-10-CM | POA: Diagnosis not present

## 2018-10-25 DIAGNOSIS — C50312 Malignant neoplasm of lower-inner quadrant of left female breast: Secondary | ICD-10-CM | POA: Diagnosis not present

## 2018-10-26 ENCOUNTER — Ambulatory Visit
Admission: RE | Admit: 2018-10-26 | Discharge: 2018-10-26 | Disposition: A | Discharge status: Home / Self Care | Payer: Medicare Other | Source: Ambulatory Visit | Attending: Radiation Oncology | Admitting: Radiation Oncology

## 2018-10-26 DIAGNOSIS — Z170 Estrogen receptor positive status [ER+]: Secondary | ICD-10-CM | POA: Diagnosis not present

## 2018-10-26 DIAGNOSIS — Z510 Encounter for antineoplastic radiation therapy: Secondary | ICD-10-CM | POA: Diagnosis not present

## 2018-10-26 DIAGNOSIS — C50312 Malignant neoplasm of lower-inner quadrant of left female breast: Secondary | ICD-10-CM | POA: Diagnosis not present

## 2018-10-27 ENCOUNTER — Ambulatory Visit
Admission: RE | Admit: 2018-10-27 | Discharge: 2018-10-27 | Disposition: A | Discharge status: Home / Self Care | Payer: Medicare Other | Source: Ambulatory Visit | Attending: Radiation Oncology | Admitting: Radiation Oncology

## 2018-10-27 DIAGNOSIS — C50312 Malignant neoplasm of lower-inner quadrant of left female breast: Secondary | ICD-10-CM | POA: Diagnosis not present

## 2018-10-27 DIAGNOSIS — Z170 Estrogen receptor positive status [ER+]: Secondary | ICD-10-CM | POA: Diagnosis not present

## 2018-10-27 DIAGNOSIS — Z510 Encounter for antineoplastic radiation therapy: Secondary | ICD-10-CM | POA: Diagnosis not present

## 2018-10-30 ENCOUNTER — Ambulatory Visit
Admission: RE | Admit: 2018-10-30 | Discharge: 2018-10-30 | Disposition: A | Discharge status: Home / Self Care | Payer: Medicare Other | Source: Ambulatory Visit | Attending: Radiation Oncology | Admitting: Radiation Oncology

## 2018-10-30 DIAGNOSIS — Z510 Encounter for antineoplastic radiation therapy: Secondary | ICD-10-CM | POA: Diagnosis not present

## 2018-10-30 DIAGNOSIS — C50312 Malignant neoplasm of lower-inner quadrant of left female breast: Secondary | ICD-10-CM

## 2018-10-30 DIAGNOSIS — Z170 Estrogen receptor positive status [ER+]: Secondary | ICD-10-CM | POA: Diagnosis not present

## 2018-10-30 MED ORDER — SONAFINE EX EMUL
1.0000 "application " | Freq: Once | CUTANEOUS | Status: AC
  Administered 2018-10-30: 1 via TOPICAL

## 2018-10-31 ENCOUNTER — Ambulatory Visit
Admission: RE | Admit: 2018-10-31 | Discharge: 2018-10-31 | Disposition: A | Discharge status: Home / Self Care | Payer: Medicare Other | Source: Ambulatory Visit | Attending: Radiation Oncology | Admitting: Radiation Oncology

## 2018-10-31 DIAGNOSIS — Z170 Estrogen receptor positive status [ER+]: Secondary | ICD-10-CM | POA: Diagnosis not present

## 2018-10-31 DIAGNOSIS — C50312 Malignant neoplasm of lower-inner quadrant of left female breast: Secondary | ICD-10-CM | POA: Diagnosis not present

## 2018-10-31 DIAGNOSIS — Z510 Encounter for antineoplastic radiation therapy: Secondary | ICD-10-CM | POA: Diagnosis not present

## 2018-11-01 ENCOUNTER — Ambulatory Visit
Admission: RE | Admit: 2018-11-01 | Discharge: 2018-11-01 | Disposition: A | Discharge status: Home / Self Care | Payer: Medicare Other | Source: Ambulatory Visit | Attending: Radiation Oncology | Admitting: Radiation Oncology

## 2018-11-01 DIAGNOSIS — C50312 Malignant neoplasm of lower-inner quadrant of left female breast: Secondary | ICD-10-CM | POA: Diagnosis not present

## 2018-11-01 DIAGNOSIS — Z510 Encounter for antineoplastic radiation therapy: Secondary | ICD-10-CM | POA: Diagnosis not present

## 2018-11-01 DIAGNOSIS — Z170 Estrogen receptor positive status [ER+]: Secondary | ICD-10-CM | POA: Diagnosis not present

## 2018-11-02 ENCOUNTER — Telehealth: Payer: Self-pay

## 2018-11-02 ENCOUNTER — Ambulatory Visit
Admission: RE | Admit: 2018-11-02 | Discharge: 2018-11-02 | Disposition: A | Discharge status: Home / Self Care | Payer: Medicare Other | Source: Ambulatory Visit | Attending: Radiation Oncology | Admitting: Radiation Oncology

## 2018-11-02 DIAGNOSIS — C50312 Malignant neoplasm of lower-inner quadrant of left female breast: Secondary | ICD-10-CM | POA: Diagnosis not present

## 2018-11-02 DIAGNOSIS — Z170 Estrogen receptor positive status [ER+]: Secondary | ICD-10-CM | POA: Diagnosis not present

## 2018-11-02 DIAGNOSIS — Z510 Encounter for antineoplastic radiation therapy: Secondary | ICD-10-CM | POA: Diagnosis not present

## 2018-11-02 NOTE — Telephone Encounter (Signed)
Pt called complaining that her xeloda is causing so much neuropathy on her left side of arm and legs. Pt also having severe discoloration of her hands and face. Pt states that her skin is darkening and its getting worse. She complains of pain that makes it hard for her to ambulate and get out of bed, due to neuropathy pain. Pt radiation to be completed next week.   Discussed with Dr.Gudena that pt symptomatic with xeloda. She takes 4000mg  per day m-f. Per MD, okay to stop taking xeloda, starting today and will reevaluate symptoms in 1 month. Pt verbalized understanding and will see Dr.Gudena after her last radiation next week.

## 2018-11-03 ENCOUNTER — Ambulatory Visit
Admission: RE | Admit: 2018-11-03 | Discharge: 2018-11-03 | Disposition: A | Discharge status: Home / Self Care | Payer: Medicare Other | Source: Ambulatory Visit | Attending: Radiation Oncology | Admitting: Radiation Oncology

## 2018-11-03 DIAGNOSIS — Z510 Encounter for antineoplastic radiation therapy: Secondary | ICD-10-CM | POA: Diagnosis not present

## 2018-11-03 DIAGNOSIS — Z170 Estrogen receptor positive status [ER+]: Secondary | ICD-10-CM | POA: Diagnosis not present

## 2018-11-03 DIAGNOSIS — C50312 Malignant neoplasm of lower-inner quadrant of left female breast: Secondary | ICD-10-CM | POA: Diagnosis not present

## 2018-11-06 ENCOUNTER — Other Ambulatory Visit: Payer: Self-pay | Admitting: Internal Medicine

## 2018-11-06 ENCOUNTER — Ambulatory Visit
Admission: RE | Admit: 2018-11-06 | Discharge: 2018-11-06 | Disposition: A | Discharge status: Home / Self Care | Payer: Medicare Other | Source: Ambulatory Visit | Attending: Radiation Oncology | Admitting: Radiation Oncology

## 2018-11-06 DIAGNOSIS — Z794 Long term (current) use of insulin: Principal | ICD-10-CM

## 2018-11-06 DIAGNOSIS — C50312 Malignant neoplasm of lower-inner quadrant of left female breast: Secondary | ICD-10-CM | POA: Diagnosis not present

## 2018-11-06 DIAGNOSIS — E1140 Type 2 diabetes mellitus with diabetic neuropathy, unspecified: Secondary | ICD-10-CM

## 2018-11-06 DIAGNOSIS — Z170 Estrogen receptor positive status [ER+]: Secondary | ICD-10-CM | POA: Diagnosis not present

## 2018-11-06 DIAGNOSIS — Z510 Encounter for antineoplastic radiation therapy: Secondary | ICD-10-CM | POA: Diagnosis not present

## 2018-11-07 ENCOUNTER — Ambulatory Visit
Admission: RE | Admit: 2018-11-07 | Discharge: 2018-11-07 | Disposition: A | Discharge status: Home / Self Care | Payer: Medicare Other | Source: Ambulatory Visit | Attending: Radiation Oncology | Admitting: Radiation Oncology

## 2018-11-07 DIAGNOSIS — C50312 Malignant neoplasm of lower-inner quadrant of left female breast: Secondary | ICD-10-CM

## 2018-11-07 DIAGNOSIS — Z510 Encounter for antineoplastic radiation therapy: Secondary | ICD-10-CM | POA: Diagnosis not present

## 2018-11-07 DIAGNOSIS — Z170 Estrogen receptor positive status [ER+]: Secondary | ICD-10-CM | POA: Diagnosis not present

## 2018-11-07 MED ORDER — RADIAPLEXRX EX GEL
Freq: Once | CUTANEOUS | Status: AC
  Administered 2018-11-07: 11:00:00 via TOPICAL

## 2018-11-08 ENCOUNTER — Ambulatory Visit
Admission: RE | Admit: 2018-11-08 | Discharge: 2018-11-08 | Disposition: A | Discharge status: Home / Self Care | Payer: Medicare Other | Source: Ambulatory Visit | Attending: Radiation Oncology | Admitting: Radiation Oncology

## 2018-11-08 DIAGNOSIS — Z510 Encounter for antineoplastic radiation therapy: Secondary | ICD-10-CM | POA: Diagnosis not present

## 2018-11-08 DIAGNOSIS — Z170 Estrogen receptor positive status [ER+]: Secondary | ICD-10-CM | POA: Diagnosis not present

## 2018-11-08 DIAGNOSIS — C50312 Malignant neoplasm of lower-inner quadrant of left female breast: Secondary | ICD-10-CM | POA: Diagnosis not present

## 2018-11-08 NOTE — Progress Notes (Signed)
Patient Care Team: Bartholomew Crews, MD as PCP - General (Internal Medicine) Hayden Pedro, MD as Consulting Physician (Ophthalmology) Calton Dach, MD as Referring Physician (Optometry) Minus Breeding, MD as Consulting Physician (Cardiology)  DIAGNOSIS:    ICD-10-CM   1. 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 S/p lumpectomy and radiation.    03/21/2018 Initial Diagnosis    Peau [place] Orange skin anterior left breast: Ultrasound 7 o'clock position 2 cm from nipple irregular hypoechoic mass 2.2 x 2.5 x 2.5 cm (1 cm medial to prior lumpectomy); 12:00: Hypoechoic mass extending to skin 3.9 x 2.9 x 3.6 cm market skin thickening, no axillary lymph nodes    03/21/2018 Initial Biopsy    Left breast 12 o'clock position: IDC grade 3; 7 o'clock position: IDC grade 3, ER 40%, PR 0%, Ki-67 80%, HER-2 equivocal by FISH ratio 1.17 copy #4.4, IHC 1+ negative, T4d N0 stage IIIc clinical stage     04/20/2018 Imaging    IMPRESSION: 1. Multiple masses within the left breast (inflammatory breast carcinoma.) 2. Cortically thickened right axillary lymph node, indeterminate. Metastatic disease not excluded (patient refused biopsy)    05/09/2018 - 06/20/2018 Neo-Adjuvant Chemotherapy    Neoadjuvant chemotherapy with TC X 4    07/20/2018 Surgery    Bilateral mastectomies: Left mastectomy: Grade 3 IDC 6.5 cm, 1/2 lymph nodes positive, ER 40%, PR 0%, HER-2 negative, Ki-67 80%; right axillary lymph node 7/12 LN pos ER 0%, PR 0%, HER-2 negative, Ki-67 90% RCB class III; right mastectomy: Benign    08/02/2018 Cancer Staging    Staging form: Breast, AJCC 8th Edition -  Clinical: Stage IIIC (cT4d, cN0, cM0, G3, ER+, PR-, HER2-) - Signed by Gardenia Phlegm, NP on 08/02/2018    08/02/2018 Cancer Staging    Staging form: Breast, AJCC 8th Edition - Pathologic: No Stage Recommended (ypT4d, pN1a, cM0, G3, ER-, PR-, HER2-) - Signed by Eppie Gibson, MD on 09/15/2018    10/02/2018 - 11/09/2018 Radiation Therapy    Adjuvant XRT     Carcinoma of axillary tail of breast, right ([place])   09/15/2018 Initial Diagnosis    Carcinoma of axillary tail of breast, right ([place])    09/15/2018 Cancer Staging    Staging form: Breast, AJCC 8th Edition - Pathologic: No Stage Recommended (ypT0, pN2a, cM0, G3, ER-, PR-, HER2-) - Signed by Eppie Gibson, MD on 09/15/2018     CHIEF COMPLIANT: Follow-up of radiation and Xeloda toxicities  INTERVAL HISTORY: [name] is a 68 y.o. with above-mentioned history of 2006 left breast cancer treated with lumpectomy and radiation who presented with inflammatory changes in the left breast as well as enlarged right axillary lymph nodes. She underwent neoadjuvant chemotherapy with Taxotere Cytoxan and then underwent bilateral mastectomies.She finished adjuvant radiation therapy today. She presents to the clinic today with her daughter. She reports severe neuropathy in her left arm and leg and worsening discoloration of skin on hands, chest, and face and stopped Xeloda on 11/02/18. She has worsening pain in her hips and legs and cysts on her fingers in addition to fluid collecting in her abdomen. She has been exercising and  will begin physical therapy. She reviewed her medication list with me and requested to have her port removed.   REVIEW OF SYSTEMS:   Constitutional: Denies fevers, chills or abnormal weight loss (+) discoloration of skin on hands and face Eyes: Denies blurriness of vision Ears, nose, mouth, throat, and face: Denies mucositis or sore throat Respiratory: Denies cough, dyspnea or wheezes Cardiovascular: Denies  palpitation, chest discomfort Gastrointestinal: Denies nausea, heartburn or change in bowel habits (+) fluid collection in abdomen Skin: Denies abnormal skin rashes (+) cysts on fingers MSK: (+) hip pain Lymphatics: Denies new lymphadenopathy or easy bruising Neurological: Denies new weaknesses (+) neuropathy in left arm and leg Behavioral/Psych: Mood is stable, no new changes  Extremities: No lower extremity edema Breast: denies any pain or lumps or nodules in either breasts All other systems were reviewed with the patient and are negative.  I have reviewed the past medical history, past surgical history, social history and family history with the patient and they are unchanged from previous note.  ALLERGIES:  is allergic to lisinopril; oxycodone-acetaminophen; percocet [oxycodone-acetaminophen]; victoza [liraglutide]; and atorvastatin.  MEDICATIONS:  Current Outpatient Medications  Medication Sig Dispense Refill  . ACCU-CHEK FASTCLIX LANCETS MISC Check blood sugar 3 times a day 102 each 5  . ACCU-CHEK GUIDE test strip USE 1  THREE TIMES DAILY 375 each 1  . amLODipine (NORVASC) 10 MG tablet TAKE ONE (1) TABLET BY MOUTH EVERY DAY (Patient taking differently: Take 10 mg by mouth daily. ) 90 tablet 3  . aspirin EC 81 MG tablet Take 81 mg by mouth daily.    . chlorpheniramine-HYDROcodone (TUSSIONEX PENNKINETIC ER) 10-8 MG/5ML SUER Take 5 mLs by mouth 2 (two) times daily. 140 mL 0  . Coenzyme Q10 (CO Q 10) 100 MG CAPS Take 100 mg by mouth daily.     Marland Kitchen ezetimibe (ZETIA) 10 MG tablet TAKE ONE (1) TABLET BY MOUTH EVERY DAY 30 tablet 7  . furosemide (LASIX) 20 MG tablet TAKE 1 TABLET (20 MG TOTAL) BY MOUTH AS NEEDED FOR EDEMA. (Patient taking differently: Take 20 mg by mouth daily as needed for edema. ) 30 tablet 2  . gabapentin (NEURONTIN) 300 MG capsule Take 1 capsule (300 mg total) by mouth at bedtime. 30 capsule 2  . insulin aspart (NOVOLOG FLEXPEN) 100 UNIT/ML FlexPen Inject 4 units before  each meal 3 times daily. (Patient taking differently: Inject 5 units before each meal 3 times daily.) 15 mL 1  . insulin aspart protamine- aspart (NOVOLOG MIX 70/30) (70-30) 100 UNIT/ML injection Inject 2-5 Units into the skin See admin instructions. Inject per sliding scale 201-250 = 2 units, 251-300 = 3 units, 301-400 = 4 units, >401 = 5 units and call MD SQ 3 times daily.    . Insulin Glargine (TOUJEO SOLOSTAR) 300 UNIT/ML SOPN Inject 30 Units into the skin daily. 12 mL 3  . Insulin Pen Needle 32G X 4 MM MISC USE FOR INJECTIONS up to 4 times daily. Diagnosis code E11.40, Z79.4 200 each 5  . isosorbide mononitrate (IMDUR) 120 MG 24 hr tablet Take 2 tablets (240 mg total) by mouth every morning. (Patient taking differently: Take 120 mg by mouth 2 (two) times daily. ) 30 tablet 11  . loratadine (CLARITIN) 10 MG tablet Take 10 mg by mouth daily as needed for allergies.    Marland Kitchen losartan (COZAAR) 50 MG tablet TAKE ONE (1) TABLET BY MOUTH EVERY DAY (Patient taking differently: Take 50 mg by mouth daily. ) 90 tablet 3  .  meloxicam (MOBIC) 7.5 MG tablet Take 1 tablet (7.5 mg total) by mouth daily as needed for pain. 90 tablet 0  . metFORMIN (GLUCOPHAGE) 1000 MG tablet TAKE ONE (1) TABLET BY MOUTH TWO (2) TIMES DAILY WITH A MEAL (Patient taking differently: Take 1,000 mg by mouth 2 (two) times daily with a meal. ) 180 tablet 3  . methocarbamol (ROBAXIN) 750 MG tablet Take 1 tablet (750 mg total) by mouth every 6 (six) hours as needed for muscle spasms. 30 tablet 1  . metoprolol succinate (TOPROL-XL) 100 MG 24 hr tablet TAKE ONE (1) TABLET BY MOUTH EVERY DAY (Patient taking differently: Take 100 mg by mouth daily. ) 90 tablet 3  . nitroGLYCERIN (NITRODUR - DOSED IN MG/24 HR) 0.4 mg/hr patch APPLY ONE PATCH ON SKIN EVERY DAY. LEAVE ON FOR 12 HOURS AND OFF FOR 12 HOURS. 100 patch 3  . nitroGLYCERIN (NITROSTAT) 0.4 MG SL tablet PLACE 1 TABLET UNDER THE TONGUE EVERY 5 MINUTES AS NEEDED FOR CHEST PAIN 25 tablet 7  .  Olopatadine HCl (PATADAY) 0.2 % SOLN Place 1 drop into both eyes daily as needed (for allergies).     Marland Kitchen omeprazole (PRILOSEC) 40 MG capsule Take 1 capsule (40 mg total) by mouth daily. (Patient taking differently: Take 40 mg by mouth daily as needed (for acid reflux). ) 30 capsule 5  . spironolactone (ALDACTONE) 25 MG tablet Take 1 tablet (25 mg total) by mouth daily. 90 tablet 3  . traMADol (ULTRAM) 50 MG tablet Take 1 tablet (50 mg total) by mouth every 12 (twelve) hours as needed. (Patient taking differently: Take 50 mg by mouth every 12 (twelve) hours as needed for moderate pain. ) 30 tablet 0  . vitamin B-12 (CYANOCOBALAMIN) 1000 MCG tablet Take 1,000 mcg by mouth daily.     No current facility-administered medications for this visit.     PHYSICAL EXAMINATION: ECOG PERFORMANCE STATUS: 2 - Symptomatic, <50% confined to bed  Vitals:   11/09/18 1046  BP: (!) 156/75  Pulse: 83  Resp: 17  Temp: 98.4 F (36.9 C)  SpO2: 100%   Filed Weights   11/09/18 1046  Weight: 214 lb 6.4 oz (97.3 kg)    GENERAL: alert, no distress and comfortable SKIN: skin color, texture, turgor are normal, no rashes or significant lesions EYES: normal, Conjunctiva are pink and non-injected, sclera clear OROPHARYNX: no exudate, no erythema and lips, buccal mucosa, and tongue normal  NECK: supple, thyroid normal size, non-tender, without nodularity LYMPH: no palpable lymphadenopathy in the cervical, axillary or inguinal LUNGS: clear to auscultation and percussion with normal breathing effort HEART: regular rate & rhythm and no murmurs and no lower extremity edema ABDOMEN: abdomen soft, non-tender and normal bowel sounds MUSCULOSKELETAL: no cyanosis of digits and no clubbing  NEURO: alert & oriented x 3 with fluent speech, no focal motor/sensory deficits EXTREMITIES: No lower extremity edema  LABORATORY DATA:  I have reviewed the data as listed CMP Latest Ref Rng & Units 10/19/2018 09/07/2018 07/20/2018    Glucose 70 - 99 mg/dL 124(H) 138(H) -  BUN 8 - 23 mg/dL 21 15 -  Creatinine 0.44 - 1.00 mg/dL 0.95 0.85 -  Sodium 135 - 145 mmol/L 142 142 139  Potassium 3.5 - 5.1 mmol/L 4.4 3.9 4.5  Chloride 98 - 111 mmol/L 110 107 -  CO2 22 - 32 mmol/L 25 25 -  Calcium 8.9 - 10.3 mg/dL 9.7 9.8 -  Total Protein 6.5 - 8.1 g/dL 7.4 7.5 -  Total  Bilirubin 0.3 - 1.2 mg/dL 0.3 <0.2(L) -  Alkaline Phos 38 - 126 U/L 59 74 -  AST 15 - 41 U/L 13(L) 14(L) -  ALT 0 - 44 U/L 12 13 -    Lab Results  Component Value Date   WBC 3.8 (L) 10/19/2018   HGB 10.8 (L) 10/19/2018   HCT 34.5 (L) 10/19/2018   MCV 88.0 10/19/2018   PLT 159 10/19/2018   NEUTROABS 2.2 10/19/2018    ASSESSMENT & PLAN:  Malignant neoplasm of lower-inner quadrant of left breast in female, estrogen receptor positive [facility]) 2006 June : 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 S/p lumpectomy and radiation.  03/21/2018:[place] skin anterior left breast: Ultrasound 7 o'clock position 2 cm from nipple irregular hypoechoic mass 2.2 x 2.5 x 2.5 cm (1 cm medial to prior lumpectomy); 12:00: Hypoechoic mass extending to skin 3.9 x 2.9 x 3.6 cm market skin thickening, no axillary lymph nodes  Left breast 12 o'clock position: IDC grade 3; 7 o'clock position: IDC grade 3, ER 40%, PR 0%, Ki-67 80%, HER-2 equivocal by FISH ratio 1.17 copy #4.4, IHC 1+ negative, T4d N0 stage IIIc clinical stage  Right breast and axillary ultrasound: Prominent right axillary lymph node.Biopsy revealed metastatic poorly differentiated carcinoma ER 0%, PR 0%, Ki-67 90%  CT CAP 04/20/18:Breast masses and axillary lymph node, no metastatic disease elsewhere ----------------------------------------------------------------------------- Treatment summary: 1. Neoadjuvant chemotherapy withTaxotere Cytoxan x4cycles completed 06/20/2018 (due to cardiac issues she was not a candidate for Adriamycin) 2.  Bilateral mastectomies 07/20/2018 3.  Followed by Xeloda-radiation completed 11/09/2018 4. Followed by antiestrogen therapy ---------------------------------------------------------------------------- 07/20/2018: Bilateral mastectomies:  Left mastectomy: Grade 3 IDC 6.5 cm, 1/2 lymph nodes positive, ER 40%, PR 0%, HER-2 negative, Ki-67 80%;  right axillary lymph node 7/12 Positive ER 0%, PR 0%, HER-2 negative, Ki-67 90% RCB class III;  Right mastectomy: Benign ---------------------------------------------------- Current treatment: Xeloda discontinued 11/04/2018 Xeloda toxicities: 1.  Darkening of the skin of the hands 2.  Worsening peripheral neuropathy Patient appears to be unable to tolerate it so we will discontinue treatment. We will start her on oral antiestrogen therapy starting in 6 weeks with anastrozole 1 mg daily.   Return to clinic in 6 weeks for follow-up and to discuss antiestrogen treatment.    Orders Placed This Encounter  Procedures  . IR Removal Tun Access W/ Port W/O FL    Standing Status:   Future    Standing Expiration Date:   02-02-2020    Order Specific Question:   Reason for exam:    Answer:   Removal of port since chemo is done    Order Specific Question:   Preferred Imaging Location?    Answer:   [facility]   The patient has a good understanding of the overall plan. she agrees with it. she will call with any problems that may develop before the next visit here.  Nicholas Lose, MD 11/09/2018  Julious Oka Dorshimer am acting as scribe for Dr. Nicholas Lose.  I have reviewed the above documentation for accuracy and completeness, and I agree with the above.

## 2018-11-09 ENCOUNTER — Ambulatory Visit
Admission: RE | Admit: 2018-11-09 | Discharge: 2018-11-09 | Disposition: A | Discharge status: Home / Self Care | Payer: Medicare Other | Source: Ambulatory Visit | Attending: Radiation Oncology | Admitting: Radiation Oncology

## 2018-11-09 ENCOUNTER — Telehealth: Payer: Self-pay | Admitting: Hematology and Oncology

## 2018-11-09 ENCOUNTER — Inpatient Hospital Stay: Payer: Medicare Other | Attending: Hematology and Oncology | Admitting: Hematology and Oncology

## 2018-11-09 DIAGNOSIS — G620 Drug-induced polyneuropathy: Secondary | ICD-10-CM | POA: Diagnosis not present

## 2018-11-09 DIAGNOSIS — Z170 Estrogen receptor positive status [ER+]: Secondary | ICD-10-CM | POA: Diagnosis not present

## 2018-11-09 DIAGNOSIS — C773 Secondary and unspecified malignant neoplasm of axilla and upper limb lymph nodes: Secondary | ICD-10-CM

## 2018-11-09 DIAGNOSIS — Z510 Encounter for antineoplastic radiation therapy: Secondary | ICD-10-CM | POA: Diagnosis not present

## 2018-11-09 DIAGNOSIS — C50812 Malignant neoplasm of overlapping sites of left female breast: Secondary | ICD-10-CM | POA: Diagnosis not present

## 2018-11-09 DIAGNOSIS — G629 Polyneuropathy, unspecified: Secondary | ICD-10-CM | POA: Insufficient documentation

## 2018-11-09 DIAGNOSIS — C50312 Malignant neoplasm of lower-inner quadrant of left female breast: Secondary | ICD-10-CM

## 2018-11-09 NOTE — Telephone Encounter (Signed)
Gave avs and calendar ° °

## 2018-11-09 NOTE — Assessment & Plan Note (Addendum)
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 S/p lumpectomy and radiation.  03/21/2018:[place]au [place] Orange skin anterior left breast: Ultrasound 7 o'clock position 2 cm from nipple irregular hypoechoic mass 2.2 x 2.5 x 2.5 cm (1 cm medial to prior lumpectomy); 12:00: Hypoechoic mass extending to skin 3.9 x 2.9 x 3.6 cm market skin thickening, no axillary lymph nodes  Left breast 12 o'clock position: IDC grade 3; 7 o'clock position: IDC grade 3, ER 40%, PR 0%, Ki-67 80%, HER-2 equivocal by FISH ratio 1.17 copy #4.4, IHC 1+ negative, T4d N0 stage IIIc clinical stage  Right breast and axillary ultrasound: Prominent right axillary lymph node.Biopsy revealed metastatic poorly differentiated carcinoma ER 0%, PR 0%, Ki-67 90%  CT CAP 04/20/18:Breast masses and axillary lymph node, no metastatic disease elsewhere ----------------------------------------------------------------------------- Treatment summary: 1. Neoadjuvant chemotherapy withTaxotere Cytoxan x4cycles completed 06/20/2018 (due to cardiac issues she was not a candidate for Adriamycin) 2.  Bilateral mastectomies 07/20/2018 3. Followed by Xeloda-radiation 4. Followed by antiestrogen therapy ---------------------------------------------------------------------------- 07/20/2018: Bilateral mastectomies:  Left mastectomy: Grade 3 IDC 6.5 cm, 1/2 lymph nodes positive, ER 40%, PR 0%, HER-2 negative, Ki-67 80%;  right axillary lymph node 7/12 Positive ER 0%, PR 0%, HER-2 negative, Ki-67 90% RCB class III;  Right mastectomy: Benign ---------------------------------------------------- Current treatment: Xeloda discontinued 11/04/2018 Xeloda toxicities: 1.  Darkening of the skin of the hands 2.  Worsening peripheral neuropathy Patient appears to be unable to tolerate it so we will discontinue treatment. We will start her on oral antiestrogen therapy starting in 6 weeks with anastrozole 1 mg daily.   Return to  clinic in 6 weeks for follow-up and to discuss antiestrogen treatment.

## 2018-11-10 ENCOUNTER — Encounter: Payer: Self-pay | Admitting: Radiation Oncology

## 2018-11-10 NOTE — Progress Notes (Signed)
  Radiation Oncology         [phone] ________________________________  Name: [name] MRN: [id number]  Date: 11/10/2018  DOB: 1951/09/04  End of Treatment Note  Diagnosis:   Cancer Staging Carcinoma of axillary tail of breast, right ([place]) Staging form: Breast, AJCC 8th Edition - Pathologic: No Stage Recommended (ypT0, pN2a, cM0, G3, ER-, PR-, HER2-) - Signed by Eppie Gibson, MD on 09/15/2018  Malignant neoplasm of lower-inner quadrant of left breast in female, estrogen receptor positive ([place]) Staging form: Breast, AJCC 8th Edition - Pathologic: No Stage Recommended (ypT4d, pN1a, cM0, G3, ER-, PR-, HER2-) - Signed by Eppie Gibson, MD on 09/15/2018 - Clinical: Stage IIIC (cT4d, cN0, cM0, G3, ER+, PR-, HER2-) - Signed by Gardenia Phlegm, NP on 08/02/2018     Indication for treatment:  Curative       Radiation treatment dates:   10/02/2018-11/09/2018  Site/dose:    1. Left chest wall /PAB/ SCV, 1.8 Gy x 28 fractions for a total dose of 50.4 Gy.                      2. Right chest wall /PAB/SCV, 1.8 Gy x 28 fractions for a total dose of 50.4 Gy.             Beams/energy:   1. 3D, Varied energies                   2.  3D,  Varied energies  Narrative: The patient tolerated radiation treatment relatively well. At the beginning of her treatment, she noted redness to her mastectomy site. She was noted to be using radiaplex as instructed. On PE, it was noted minimal erythema to the right chest wall inferior to the mastectomy scar. No signs of infection, the left chest wall shows no signs of infection or significant radiation reaction at this point. Towards the end of her treatments, she noted hyperpigmentation to her right/left chest. She did have an open area to the skin fold to her right chest which she was using neosporin for. On PE it was noted, a 3 cm fissure of moist peeling in the lateral skin fold of the right chest and inferior to this in another skin fold there is  another 2 cm area of moist peeling. She also had a 3 cm area of dry peeling in the lateral fold of the left chest.   Plan: The patient has completed radiation treatment. The patient will return to radiation oncology clinic for routine followup in one month. I advised them to call or return sooner if they have any questions or concerns related to their recovery or treatment.  -----------------------------------  Eppie Gibson, MD  This document serves as a record of services personally performed by Eppie Gibson, MD. It was created on her behalf by Steva Colder, a trained medical scribe. The creation of this record is based on the scribe's personal observations and the provider's statements to them. This document has been checked and approved by the attending provider.

## 2018-11-11 ENCOUNTER — Other Ambulatory Visit: Payer: Self-pay | Admitting: Internal Medicine

## 2018-11-11 DIAGNOSIS — Z794 Long term (current) use of insulin: Principal | ICD-10-CM

## 2018-11-11 DIAGNOSIS — E1140 Type 2 diabetes mellitus with diabetic neuropathy, unspecified: Secondary | ICD-10-CM

## 2018-11-13 ENCOUNTER — Other Ambulatory Visit: Payer: Self-pay | Admitting: Radiology

## 2018-11-13 ENCOUNTER — Other Ambulatory Visit: Payer: Self-pay | Admitting: Physician Assistant

## 2018-11-13 NOTE — Telephone Encounter (Signed)
Needs PCP apppt DM

## 2018-11-14 ENCOUNTER — Encounter (HOSPITAL_COMMUNITY): Payer: Self-pay

## 2018-11-14 ENCOUNTER — Other Ambulatory Visit: Payer: Self-pay

## 2018-11-14 ENCOUNTER — Ambulatory Visit (HOSPITAL_COMMUNITY): Admission: RE | Admit: 2018-11-14 | Payer: Medicare Other | Source: Ambulatory Visit

## 2018-11-14 ENCOUNTER — Ambulatory Visit (HOSPITAL_COMMUNITY)
Admission: RE | Admit: 2018-11-14 | Discharge: 2018-11-14 | Disposition: A | Discharge status: Home / Self Care | Payer: Medicare Other | Source: Ambulatory Visit | Attending: Hematology and Oncology | Admitting: Hematology and Oncology

## 2018-11-14 DIAGNOSIS — Z9013 Acquired absence of bilateral breasts and nipples: Secondary | ICD-10-CM | POA: Diagnosis not present

## 2018-11-14 DIAGNOSIS — I110 Hypertensive heart disease with heart failure: Secondary | ICD-10-CM | POA: Diagnosis not present

## 2018-11-14 DIAGNOSIS — Z833 Family history of diabetes mellitus: Secondary | ICD-10-CM | POA: Insufficient documentation

## 2018-11-14 DIAGNOSIS — Z803 Family history of malignant neoplasm of breast: Secondary | ICD-10-CM | POA: Insufficient documentation

## 2018-11-14 DIAGNOSIS — K219 Gastro-esophageal reflux disease without esophagitis: Secondary | ICD-10-CM | POA: Diagnosis not present

## 2018-11-14 DIAGNOSIS — I5032 Chronic diastolic (congestive) heart failure: Secondary | ICD-10-CM | POA: Insufficient documentation

## 2018-11-14 DIAGNOSIS — I2510 Atherosclerotic heart disease of native coronary artery without angina pectoris: Secondary | ICD-10-CM | POA: Insufficient documentation

## 2018-11-14 DIAGNOSIS — Z170 Estrogen receptor positive status [ER+]: Secondary | ICD-10-CM | POA: Insufficient documentation

## 2018-11-14 DIAGNOSIS — Z885 Allergy status to narcotic agent status: Secondary | ICD-10-CM | POA: Insufficient documentation

## 2018-11-14 DIAGNOSIS — C50312 Malignant neoplasm of lower-inner quadrant of left female breast: Secondary | ICD-10-CM | POA: Insufficient documentation

## 2018-11-14 DIAGNOSIS — Z801 Family history of malignant neoplasm of trachea, bronchus and lung: Secondary | ICD-10-CM | POA: Insufficient documentation

## 2018-11-14 DIAGNOSIS — E785 Hyperlipidemia, unspecified: Secondary | ICD-10-CM | POA: Diagnosis not present

## 2018-11-14 DIAGNOSIS — M1990 Unspecified osteoarthritis, unspecified site: Secondary | ICD-10-CM | POA: Diagnosis not present

## 2018-11-14 DIAGNOSIS — Z90710 Acquired absence of both cervix and uterus: Secondary | ICD-10-CM | POA: Diagnosis not present

## 2018-11-14 DIAGNOSIS — Z7982 Long term (current) use of aspirin: Secondary | ICD-10-CM | POA: Insufficient documentation

## 2018-11-14 DIAGNOSIS — E119 Type 2 diabetes mellitus without complications: Secondary | ICD-10-CM | POA: Diagnosis not present

## 2018-11-14 DIAGNOSIS — Z79899 Other long term (current) drug therapy: Secondary | ICD-10-CM | POA: Diagnosis not present

## 2018-11-14 DIAGNOSIS — Z8262 Family history of osteoporosis: Secondary | ICD-10-CM | POA: Insufficient documentation

## 2018-11-14 DIAGNOSIS — G4733 Obstructive sleep apnea (adult) (pediatric): Secondary | ICD-10-CM | POA: Insufficient documentation

## 2018-11-14 DIAGNOSIS — Z794 Long term (current) use of insulin: Secondary | ICD-10-CM | POA: Insufficient documentation

## 2018-11-14 DIAGNOSIS — Z452 Encounter for adjustment and management of vascular access device: Secondary | ICD-10-CM | POA: Diagnosis not present

## 2018-11-14 DIAGNOSIS — Z6841 Body Mass Index (BMI) 40.0 and over, adult: Secondary | ICD-10-CM | POA: Insufficient documentation

## 2018-11-14 DIAGNOSIS — Z888 Allergy status to other drugs, medicaments and biological substances status: Secondary | ICD-10-CM | POA: Diagnosis not present

## 2018-11-14 DIAGNOSIS — C50919 Malignant neoplasm of unspecified site of unspecified female breast: Secondary | ICD-10-CM | POA: Diagnosis not present

## 2018-11-14 DIAGNOSIS — H269 Unspecified cataract: Secondary | ICD-10-CM | POA: Insufficient documentation

## 2018-11-14 DIAGNOSIS — Z8249 Family history of ischemic heart disease and other diseases of the circulatory system: Secondary | ICD-10-CM | POA: Diagnosis not present

## 2018-11-14 HISTORY — PX: IR REMOVAL TUN ACCESS W/ PORT W/O FL MOD SED: IMG2290

## 2018-11-14 LAB — GLUCOSE, CAPILLARY: GLUCOSE-CAPILLARY: 139 mg/dL — AB (ref 70–99)

## 2018-11-14 LAB — PROTIME-INR
INR: 1.06
Prothrombin Time: 13.7 seconds (ref 11.4–15.2)

## 2018-11-14 LAB — CBC
HCT: 36.3 % (ref 36.0–46.0)
Hemoglobin: 11.5 g/dL — ABNORMAL LOW (ref 12.0–15.0)
MCH: 28.4 pg (ref 26.0–34.0)
MCHC: 31.7 g/dL (ref 30.0–36.0)
MCV: 89.6 fL (ref 80.0–100.0)
Platelets: 144 10*3/uL — ABNORMAL LOW (ref 150–400)
RBC: 4.05 MIL/uL (ref 3.87–5.11)
RDW: 22.5 % — ABNORMAL HIGH (ref 11.5–15.5)
WBC: 3.7 10*3/uL — ABNORMAL LOW (ref 4.0–10.5)
nRBC: 0 % (ref 0.0–0.2)

## 2018-11-14 LAB — APTT: aPTT: 31 seconds (ref 24–36)

## 2018-11-14 MED ORDER — SODIUM CHLORIDE 0.9 % IV SOLN
INTRAVENOUS | Status: AC | PRN
  Administered 2018-11-14: 10 mL/h via INTRAVENOUS

## 2018-11-14 MED ORDER — CEFAZOLIN SODIUM-DEXTROSE 2-4 GM/100ML-% IV SOLN
INTRAVENOUS | Status: AC
  Filled 2018-11-14: qty 100

## 2018-11-14 MED ORDER — LIDOCAINE-EPINEPHRINE (PF) 1 %-1:200000 IJ SOLN
INTRAMUSCULAR | Status: AC
  Filled 2018-11-14: qty 30

## 2018-11-14 MED ORDER — MIDAZOLAM HCL 2 MG/2ML IJ SOLN
INTRAMUSCULAR | Status: AC | PRN
  Administered 2018-11-14: 1 mg via INTRAVENOUS

## 2018-11-14 MED ORDER — LIDOCAINE-EPINEPHRINE 2 %-1:100000 IJ SOLN
INTRAMUSCULAR | Status: AC | PRN
  Administered 2018-11-14: 10 mL

## 2018-11-14 MED ORDER — CEFAZOLIN SODIUM-DEXTROSE 2-4 GM/100ML-% IV SOLN
2.0000 g | Freq: Once | INTRAVENOUS | Status: AC
  Administered 2018-11-14: 2 g via INTRAVENOUS

## 2018-11-14 MED ORDER — FENTANYL CITRATE (PF) 100 MCG/2ML IJ SOLN
INTRAMUSCULAR | Status: AC | PRN
  Administered 2018-11-14: 50 ug via INTRAVENOUS

## 2018-11-14 MED ORDER — MIDAZOLAM HCL 2 MG/2ML IJ SOLN
INTRAMUSCULAR | Status: AC
  Filled 2018-11-14: qty 2

## 2018-11-14 MED ORDER — FENTANYL CITRATE (PF) 100 MCG/2ML IJ SOLN
INTRAMUSCULAR | Status: AC
  Filled 2018-11-14: qty 2

## 2018-11-14 MED ORDER — SODIUM CHLORIDE 0.9 % IV SOLN: INTRAVENOUS | Status: DC

## 2018-11-14 NOTE — H&P (Signed)
Chief Complaint: Patient was seen in consultation today for [facility] a cath removal at the request of [place]  Referring Physician(s): Nicholas Lose  Supervising Physician: Markus Daft  Patient Status: [facility] - Out-pt  History of Present Illness: JANCY SPRANKLE is a 68 y.o. female   Hx Breast Ca  2006 Lumpectomy 2019 recurrence Bilat mastectomy 2017 PAC placed in IR 04/2018 finished adjuvant therapy 11/08/2018  Chemo therapy has caused skin changes Darken spots on face; chest arms Also causing hip and jt pain  All meds stopped now Plan for [facility] removal Considering Radiation again per notes-- maybe in a month or so   Past Medical History:  Diagnosis Date  . Arthritis    "knees, lower back" (08/18/2017)  . 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 S/p lumpectomy and radiation.  Marland Kitchen CAD (coronary artery disease) 11/07   cath 11/11 :  3V CADsee report   . Cataract    small both eyes  . Chronic lower back pain   . Chronic pain    MR 08/07/10 :  Spondylosis L4-5 with B foraminal narrowing and L4 nerve root enchroachment B.  . Chronic venous insufficiency 2012   Has had full W/U incl ECHO, LFT's, Cr, Umicro, TSH, BNP. Symptomatic treatment.  . Colonic polyp 1995   Colonoscopies 1994, 2000, and 02/02/2011. Last had 9 polyps - Tubular adenoma and tubulovillous was the pathology results without high grade dysplasia  . Diabetes mellitus type II    Insulin dependent. Has worked with [name] R previously.  . Diastolic CHF, chronic ([place])    NL EF by echo 01/2012, Gr I dd  . Essential hypertension    Requires 5 drug therapy and still difficult to control.  Marland Kitchen GERD (gastroesophageal reflux disease)   . Heart murmur   . Hyperlipidemia    On daily statin  . Iron deficiency anemia    Ferritin was 12 in 2012. on FeSo4. Has had colon and EGD 02/02/11 that showed mild gastritis and colon polyps.  . Obesity    Morbid. HAs worked with [name] T  previously.  . OSA (obstructive sleep apnea) 02/16/2007   02/2007 : Moderate AHI 35.6, O2 sat decreased to 65%, CPAP titration to 16 with AHI of 3.6. 02/2014 : Rare respiratory events with sleep disturbance, within normal limits. AHI 0.4 per hour      . OSA on CPAP   . Personal history of chemotherapy   . Personal history of radiation therapy    "to my left breast"  . Refusal of blood transfusions as patient is Jehovah's Witness   . Seasonal allergies    "grass pollen"    Past Surgical History:  Procedure Laterality Date  . BREAST LUMPECTOMY Left 2006  . BREAST LUMPECTOMY W/ NEEDLE LOCALIZATION  2006   34 Chemo RX  . CARDIAC CATHETERIZATION N/A 08/15/2015   Procedure: Left Heart Cath and Coronary Angiography;  Surgeon: Troy Sine, MD;  Location: [facility];  Service: Cardiovascular;  Laterality: N/A;  . COLONOSCOPY W/ BIOPSIES AND POLYPECTOMY  1994, 2000, 2012, 26/018  . IR IMAGING GUIDED PORT INSERTION  04/17/2018  . LEFT HEART CATH AND CORONARY ANGIOGRAPHY N/A 08/19/2017   Procedure: LEFT HEART CATH AND CORONARY ANGIOGRAPHY;  Surgeon: Belva Crome, MD;  Location: [place] [facility];  Service: Cardiovascular;  Laterality: N/A;  . MASTECTOMY MODIFIED RADICAL Bilateral 07/20/2018  . MASTECTOMY MODIFIED RADICAL Bilateral 07/20/2018   Procedure: BILATERAL  MODIFIED RADICAL MASTECTOMIES;  Surgeon: Jovita Kussmaul, MD;  Location: [facility];  Service: General;  Laterality: Bilateral;  . PORTACATH PLACEMENT N/A 04/14/2018   Procedure: ATTEMPED INSERTION PORT-A-CATH;  Surgeon: Jovita Kussmaul, MD;  Location: [facility];  Service: General;  Laterality: N/A;  . TONSILLECTOMY AND ADENOIDECTOMY  1964  . TOTAL ABDOMINAL HYSTERECTOMY  1985   "I had endometrosis"  . ULTRASOUND GUIDANCE FOR VASCULAR ACCESS  08/19/2017   Procedure: Ultrasound Guidance For Vascular Access;  Surgeon: Belva Crome, MD;  Location: [place] [facility];  Service: Cardiovascular;;    Allergies: Blood-group specific  substance; Lisinopril; Oxycodone-acetaminophen; Percocet [oxycodone-acetaminophen]; Victoza [liraglutide]; and Atorvastatin  Medications: Prior to Admission medications   Medication Sig Start Date End Date Taking? Authorizing Provider  ACCU-CHEK FASTCLIX LANCETS MISC Check blood sugar 3 times a day Patient taking differently: 1 each by Other route 3 (three) times daily.  10/11/17  Yes Bartholomew Crews, MD  ACCU-CHEK GUIDE test strip USE 1  THREE TIMES DAILY Patient taking differently: 1 each by Other route 3 (three) times daily.  07/03/18  Yes Bartholomew Crews, MD  amLODipine (NORVASC) 10 MG tablet TAKE ONE (1) TABLET BY MOUTH EVERY DAY Patient taking differently: Take 10 mg by mouth daily.  11/25/17  Yes Bartholomew Crews, MD  aspirin EC 81 MG tablet Take 81 mg by mouth daily.   Yes [provider]  Coenzyme Q10 (CO Q 10) 100 MG CAPS Take 100 mg by mouth daily.    Yes [provider]  furosemide (LASIX) 20 MG tablet TAKE 1 TABLET (20 MG TOTAL) BY MOUTH AS NEEDED FOR EDEMA. Patient taking differently: Take 20 mg by mouth daily as needed for edema.  07/07/18 07/07/19 Yes Bartholomew Crews, MD  gabapentin (NEURONTIN) 300 MG capsule Take 1 capsule (300 mg total) by mouth at bedtime. Patient taking differently: Take 300 mg by mouth daily.  06/13/18  Yes Carroll Sage, MD  insulin aspart (NOVOLOG FLEXPEN) 100 UNIT/ML FlexPen Inject 5 Units into the skin 3 (three) times daily with meals. 11/13/18  Yes Bartholomew Crews, MD  Insulin Glargine (TOUJEO SOLOSTAR) 300 UNIT/ML SOPN Inject 30 Units into the skin daily. Patient taking differently: Inject 30 Units into the skin every morning.  06/30/18  Yes Alphonzo Grieve, MD  isosorbide mononitrate (IMDUR) 120 MG 24 hr tablet Take 120 mg by mouth 2 (two) times daily.   Yes [provider]  losartan (COZAAR) 50 MG tablet TAKE ONE (1) TABLET BY MOUTH EVERY DAY Patient taking differently: Take 50 mg by mouth daily.  11/25/17   Yes Bartholomew Crews, MD  meloxicam (MOBIC) 7.5 MG tablet Take 1 tablet (7.5 mg total) by mouth daily as needed for pain. 08/21/18  Yes Bartholomew Crews, MD  metFORMIN (GLUCOPHAGE) 1000 MG tablet TAKE ONE (1) TABLET BY MOUTH TWO (2) TIMES DAILY WITH A MEAL Patient taking differently: Take 1,000 mg by mouth 2 (two) times daily with a meal.  10/13/17  Yes Bartholomew Crews, MD  methocarbamol (ROBAXIN) 750 MG tablet Take 1 tablet (750 mg total) by mouth every 6 (six) hours as needed for muscle spasms. 07/22/18  Yes Autumn Messing III, MD  metoprolol succinate (TOPROL-XL) 100 MG 24 hr tablet TAKE ONE (1) TABLET BY MOUTH EVERY DAY Patient taking differently: Take 100 mg by mouth daily.  03/30/18  Yes Bartholomew Crews, MD  Olopatadine HCl (PATADAY) 0.2 % SOLN Place 1 drop into both eyes daily as  needed (for allergies).    Yes [provider]  omeprazole (PRILOSEC) 40 MG capsule Take 1 capsule (40 mg total) by mouth daily. Patient taking differently: Take 40 mg by mouth daily as needed (for acid reflux).  05/17/18  Yes Tanner, Lyndon Code., PA-C  pyridoxine (B-6) 100 MG tablet Take 100 mg by mouth daily.   Yes [provider]  spironolactone (ALDACTONE) 25 MG tablet Take 1 tablet (25 mg total) by mouth daily. 03/30/18  Yes Bartholomew Crews, MD  traMADol (ULTRAM) 50 MG tablet Take 1 tablet (50 mg total) by mouth every 12 (twelve) hours as needed. Patient taking differently: Take 50 mg by mouth every 12 (twelve) hours as needed for moderate pain.  04/25/18  Yes Alla Feeling, NP  vitamin B-12 (CYANOCOBALAMIN) 1000 MCG tablet Take 1,000 mcg by mouth daily.   Yes [provider]  Insulin Pen Needle 32G X 4 MM MISC USE FOR INJECTIONS up to 4 times daily. Diagnosis code E11.40, Z79.4 05/04/18   Bartholomew Crews, MD     Family History  Problem Relation Age of Onset  . Lung cancer Mother   . Diabetes Father   . Breast cancer Sister   . Pulmonary embolism Brother   .  Depression Daughter 48  . Osteoarthritis Maternal Grandmother   . Heart attack Brother 61  . Kidney failure Brother        died 01-01-2016 2/2 kidney and heart failure  . Colon cancer Neg Hx   . Colon polyps Neg Hx   . Esophageal cancer Neg Hx   . Rectal cancer Neg Hx   . Stomach cancer Neg Hx     Social History   Socioeconomic History  . Marital status: Legally Separated    Spouse name: Not on file  . Number of children: Not on file  . Years of education: 51  . Highest education level: Not on file  Occupational History  . Not on file  Social Needs  . Financial resource strain: Not on file  . Food insecurity:    Worry: Not on file    Inability: Not on file  . Transportation needs:    Medical: Yes    Non-medical: No  Tobacco Use  . Smoking status: Never Smoker  . Smokeless tobacco: Never Used  Substance and Sexual Activity  . Alcohol use: No  . Drug use: No  . Sexual activity: Not Currently  Lifestyle  . Physical activity:    Days per week: Not on file    Minutes per session: Not on file  . Stress: Not at all  Relationships  . Social connections:    Talks on phone: Not on file    Gets together: Not on file    Attends religious service: Not on file    Active member of club or organization: Not on file    Attends meetings of clubs or organizations: Not on file    Relationship status: Not on file  Other Topics Concern  . Not on file  Social History Narrative   Worked at [employer] part-time from [phone], 2009-2011. No longer working 12/31/2013.Associates degree in early childhood education. Single, lives by self.    Review of Systems: A 12 point ROS discussed and pertinent positives are indicated in the HPI above.  All other systems are negative.  Review of Systems  Constitutional: Negative for activity change, fatigue and fever.  Respiratory: Negative for shortness of breath.   Cardiovascular: Negative for chest  pain.  Gastrointestinal: Negative for abdominal  pain.  Neurological: Negative for weakness.  Psychiatric/Behavioral: Negative for behavioral problems and confusion.    Vital Signs: BP (!) 217/116   Pulse 73   Temp 98 F (36.7 C)   Ht 5\' 1"  (1.549 m)   Wt 214 lb (97.1 kg)   LMP 07/12/1984   BMI 40.43 kg/m   Physical Exam Vitals signs reviewed.  Cardiovascular:     Rate and Rhythm: Normal rate and regular rhythm.     Heart sounds: Normal heart sounds.  Pulmonary:     Effort: Pulmonary effort is normal.     Breath sounds: Normal breath sounds.  Abdominal:     General: Bowel sounds are normal.     Palpations: Abdomen is soft.  Musculoskeletal: Normal range of motion.  Skin:    General: Skin is warm and dry.  Neurological:     Mental Status: She is alert and oriented to person, place, and time.  Psychiatric:        Mood and Affect: Mood normal.        Behavior: Behavior normal.        Thought Content: Thought content normal.        Judgment: Judgment normal.     Imaging: No results found.  Labs:  CBC: Recent Labs    07/13/18 1324  07/20/18 1444 09/07/18 1423 10/19/18 1222 11/14/18 1034  WBC 5.7  --   --  6.7 3.8* 3.7*  HGB 10.7*   < > 10.9* 10.3* 10.8* 11.5*  HCT 34.7*   < > 32.0* 33.1* 34.5* 36.3  PLT 262  --   --  215 159 144*   < > = values in this interval not displayed.    COAGS: Recent Labs    04/17/18 0815  INR 0.96  APTT 30    BMP: Recent Labs    07/13/18 1324  07/20/18 1442 07/20/18 1444 09/07/18 1423 10/19/18 1222  NA 140   < > 137 139 142 142  K 3.7   < > 4.5 4.5 3.9 4.4  CL 107  --  108  --  107 110  CO2 22  --  19*  --  25 25  GLUCOSE 198*  --  275*  --  138* 124*  BUN 17  --  12  --  15 21  CALCIUM 9.4  --  9.2  --  9.8 9.7  CREATININE 1.22*  --  0.77  --  0.85 0.95  GFRNONAA 45*  --  >60  --  >60 >60  GFRAA 52*  --  >60  --  >60 >60   < > = values in this interval not displayed.    LIVER FUNCTION TESTS: Recent Labs    06/20/18 1123 07/20/18 1442 09/07/18 1423  10/19/18 1222  BILITOT 0.3 0.3 <0.2* 0.3  AST 14* 26 14* 13*  ALT 12 19 13 12   ALKPHOS 64 56 74 59  PROT 6.8 6.9 7.5 7.4  ALBUMIN 3.3* 3.3* 3.4* 3.6    TUMOR MARKERS: No results for input(s): AFPTM, CEA, CA199, CHROMGRNA in the last 8760 hours.  Assessment and Plan:  Hx Breast Ca Intolerant to chemo therapy Causing skin changes and neuropathy Now for PAC removal Pt is aware of procedure benefits and risks including but not limited to Infection; bleeding; vessel damage Agreeable to proceed Consent signed and in chart   Thank you for this interesting consult.  I greatly enjoyed meeting [name]  and look forward to participating in their care.  A copy of this report was sent to the requesting provider on this date.  Electronically Signed: Lavonia Drafts, PA-C 11/14/2018, 11:04 AM   I spent a total of    25 Minutes in face to face in clinical consultation, greater than 50% of which was counseling/coordinating care for [facility] removal

## 2018-11-14 NOTE — Procedures (Signed)
Interventional Radiology Procedure:   Indications: Port is no longer needed  Procedure: Port removal  Findings: Complete removal of port  Complications: None     EBL: Minimal  Plan: Bedrest 1 hour.    Teale Goodgame R. Anselm Pancoast, MD  Pager: [phone]

## 2018-11-14 NOTE — Discharge Instructions (Addendum)
Implanted Port Removal, Care After °This sheet gives you information about how to care for yourself after your procedure. Your health care provider may also give you more specific instructions. If you have problems or questions, contact your health care provider. °What can I expect after the procedure? °After the procedure, it is common to have: °· Soreness or pain near your incision. °· Some swelling or bruising near your incision. °Follow these instructions at home: °Medicines °· Take over-the-counter and prescription medicines only as told by your health care provider. °· If you were prescribed an antibiotic medicine, take it as told by your health care provider. Do not stop taking the antibiotic even if you start to feel better. °Bathing °· Do not take baths, swim, or use a hot tub until your health care provider approves. Ask your health care provider if you can take showers. You may only be allowed to take sponge baths. °Incision care ° °· Follow instructions from your health care provider about how to take care of your incision. Make sure you: °? Wash your hands with soap and water before you change your bandage (dressing). If soap and water are not available, use hand sanitizer. °? Change your dressing as told by your health care provider. °? Keep your dressing dry. °? Leave stitches (sutures), skin glue, or adhesive strips in place. These skin closures may need to stay in place for 2 weeks or longer. If adhesive strip edges start to loosen and curl up, you may trim the loose edges. Do not remove adhesive strips completely unless your health care provider tells you to do that. °· Check your incision area every day for signs of infection. Check for: °? More redness, swelling, or pain. °? More fluid or blood. °? Warmth. °? Pus or a bad smell. °Driving ° °· Do not drive for 24 hours if you were given a medicine to help you relax (sedative) during your procedure. °· If you did not receive a sedative, ask your  health care provider when it is safe to drive. °Activity °· Return to your normal activities as told by your health care provider. Ask your health care provider what activities are safe for you. °· Do not lift anything that is heavier than 10 lb (4.5 kg), or the limit that you are told, until your health care provider says that it is safe. °· Do not do activities that involve lifting your arms over your head. °General instructions °· Do not use any products that contain nicotine or tobacco, such as cigarettes and e-cigarettes. These can delay healing. If you need help quitting, ask your health care provider. °· Keep all follow-up visits as told by your health care provider. This is important. °Contact a health care provider if: °· You have more redness, swelling, or pain around your incision. °· You have more fluid or blood coming from your incision. °· Your incision feels warm to the touch. °· You have pus or a bad smell coming from your incision. °· You have pain that is not relieved by your pain medicine. °Get help right away if you have: °· A fever or chills. °· Chest pain. °· Difficulty breathing. °Summary °· After the procedure, it is common to have pain, soreness, swelling, or bruising near your incision. °· If you were prescribed an antibiotic medicine, take it as told by your health care provider. Do not stop taking the antibiotic even if you start to feel better. °· Do not drive for 24 hours   if you were given a sedative during your procedure. °· Return to your normal activities as told by your health care provider. Ask your health care provider what activities are safe for you. °This information is not intended to replace advice given to you by your health care provider. Make sure you discuss any questions you have with your health care provider. °Document Released: 09/01/2015 Document Revised: 11/03/2017 Document Reviewed: 11/03/2017 °Elsevier Interactive Patient Education © 2019 [employer]vier  Inc[employer] ° ° °Moderate Conscious Sedation, Adult, Care After °These instructions provide you with information about caring for yourself after your procedure. Your health care provider may also give you more specific instructions. Your treatment has been planned according to current medical practices, but problems sometimes occur. Call your health care provider if you have any problems or questions after your procedure. °What can I expect after the procedure? °After your procedure, it is common: °· To feel sleepy for several hours. °· To feel clumsy and have poor balance for several hours. °· To have poor judgment for several hours. °· To vomit if you eat too soon. °Follow these instructions at home: °For at least 24 hours after the procedure: ° °· Do not: °? Participate in activities where you could fall or become injured. °? Drive. °? Use heavy machinery. °? Drink alcohol. °? Take sleeping pills or medicines that cause drowsiness. °? Make important decisions or sign legal documents. °? Take care of children on your own. °· Rest. °Eating and drinking °· Follow the diet recommended by your health care provider. °· If you vomit: °? Drink water, juice, or soup when you can drink without vomiting. °? Make sure you have little or no nausea before eating solid foods. °General instructions °· Have a responsible adult stay with you until you are awake and alert. °· Take over-the-counter and prescription medicines only as told by your health care provider. °· If you smoke, do not smoke without supervision. °· Keep all follow-up visits as told by your health care provider. This is important. °Contact a health care provider if: °· You keep feeling nauseous or you keep vomiting. °· You feel light-headed. °· You develop a rash. °· You have a fever. °Get help right away if: °· You have trouble breathing. °This information is not intended to replace advice given to you by your health care provider. Make sure you discuss any questions  you have with your health care provider. °Document Released: 07/11/2013 Document Revised: 02/23/2016 Document Reviewed: 01/10/2016 °Elsevier Interactive Patient Education © 2019 [employer] Inc. ° °

## 2018-11-22 ENCOUNTER — Other Ambulatory Visit: Payer: Self-pay | Admitting: *Deleted

## 2018-11-22 ENCOUNTER — Other Ambulatory Visit: Payer: Self-pay

## 2018-11-22 ENCOUNTER — Encounter: Payer: Self-pay | Admitting: Rehabilitation

## 2018-11-22 ENCOUNTER — Ambulatory Visit: Payer: Medicare Other | Attending: Hematology and Oncology | Admitting: Rehabilitation

## 2018-11-22 DIAGNOSIS — M6281 Muscle weakness (generalized): Secondary | ICD-10-CM | POA: Diagnosis not present

## 2018-11-22 DIAGNOSIS — I972 Postmastectomy lymphedema syndrome: Secondary | ICD-10-CM | POA: Diagnosis not present

## 2018-11-22 DIAGNOSIS — M898X9 Other specified disorders of bone, unspecified site: Secondary | ICD-10-CM

## 2018-11-22 DIAGNOSIS — M25611 Stiffness of right shoulder, not elsewhere classified: Secondary | ICD-10-CM | POA: Insufficient documentation

## 2018-11-22 DIAGNOSIS — M25612 Stiffness of left shoulder, not elsewhere classified: Secondary | ICD-10-CM | POA: Diagnosis not present

## 2018-11-22 DIAGNOSIS — Z483 Aftercare following surgery for neoplasm: Secondary | ICD-10-CM | POA: Diagnosis not present

## 2018-11-22 NOTE — Patient Instructions (Signed)
Try a mens compression tshirt from [employer] (true size or a size smaller) Or "men's long sleeve compression tshirt" on [employer]

## 2018-11-22 NOTE — Therapy (Signed)
[place], [place], [address] Phone: [phone]   Fax:  [phone]  Physical Therapy Treatment  Patient Details  Name: [name] MRN: [id number] Date of Birth: 1951/07/08 Referring Provider (PT): Dr. Lindi Adie   Encounter Date: 11/22/2018  PT End of Session - 11/22/18 2031    Visit Number  9    Number of Visits  21    Date for PT Re-Evaluation  01/03/19    Authorization Type  Medicare    PT Start Time  1430    PT Stop Time  1515    PT Time Calculation (min)  45 min    Activity Tolerance  Patient tolerated treatment well    Behavior During Therapy  [facility] for tasks assessed/performed       Past Medical History:  Diagnosis Date  . Arthritis    "knees, lower back" (08/18/2017)  . 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 S/p lumpectomy and radiation.  Marland Kitchen CAD (coronary artery disease) 11/07   cath 11/11 :  3V CADsee report   . Cataract    small both eyes  . Chronic lower back pain   . Chronic pain    MR 08/07/10 :  Spondylosis L4-5 with B foraminal narrowing and L4 nerve root enchroachment B.  . Chronic venous insufficiency 2012   Has had full W/U incl ECHO, LFT's, Cr, Umicro, TSH, BNP. Symptomatic treatment.  . Colonic polyp 1995   Colonoscopies 1994, 2000, and 02/02/2011. Last had 9 polyps - Tubular adenoma and tubulovillous was the pathology results without high grade dysplasia  . Diabetes mellitus type II    Insulin dependent. Has worked with [name] R previously.  . Diastolic CHF, chronic ([place])    NL EF by echo 01/2012, Gr I dd  . Essential hypertension    Requires 5 drug therapy and still difficult to control.  Marland Kitchen GERD (gastroesophageal reflux disease)   . Heart murmur   . Hyperlipidemia    On daily statin  . Iron deficiency anemia    Ferritin was 12 in 2012. on FeSo4. Has had colon and EGD 02/02/11 that showed mild gastritis and colon  polyps.  . Obesity    Morbid. HAs worked with [name] T previously.  . OSA (obstructive sleep apnea) 02/16/2007   02/2007 : Moderate AHI 35.6, O2 sat decreased to 65%, CPAP titration to 16 with AHI of 3.6. 02/2014 : Rare respiratory events with sleep disturbance, within normal limits. AHI 0.4 per hour      . OSA on CPAP   . Personal history of chemotherapy   . Personal history of radiation therapy    "to my left breast"  . Refusal of blood transfusions as patient is Jehovah's Witness   . Seasonal allergies    "grass pollen"    Past Surgical History:  Procedure Laterality Date  . BREAST LUMPECTOMY Left 2006  . BREAST LUMPECTOMY W/ NEEDLE LOCALIZATION  2006   34 Chemo RX  . CARDIAC CATHETERIZATION N/A 08/15/2015   Procedure: Left Heart Cath and Coronary Angiography;  Surgeon: Troy Sine, MD;  Location: [place] [facility];  Service: Cardiovascular;  Laterality: N/A;  . COLONOSCOPY W/ BIOPSIES AND POLYPECTOMY  1994, 2000, 2012, 26/018  . IR IMAGING GUIDED PORT INSERTION  04/17/2018  . IR REMOVAL TUN ACCESS W/ PORT W/O FL MOD SED  11/14/2018  . LEFT HEART CATH AND CORONARY ANGIOGRAPHY N/A 08/19/2017  Procedure: LEFT HEART CATH AND CORONARY ANGIOGRAPHY;  Surgeon: Belva Crome, MD;  Location: [place] [facility];  Service: Cardiovascular;  Laterality: N/A;  . MASTECTOMY MODIFIED RADICAL Bilateral 07/20/2018  . MASTECTOMY MODIFIED RADICAL Bilateral 07/20/2018   Procedure: BILATERAL MODIFIED RADICAL MASTECTOMIES;  Surgeon: Jovita Kussmaul, MD;  Location: [place];  Service: General;  Laterality: Bilateral;  . PORTACATH PLACEMENT N/A 04/14/2018   Procedure: ATTEMPED INSERTION PORT-A-CATH;  Surgeon: Jovita Kussmaul, MD;  [facility];  Service: General;  Laterality: N/A;  . TONSILLECTOMY AND ADENOIDECTOMY  1964  . TOTAL ABDOMINAL HYSTERECTOMY  1985   "I had endometrosis"  . ULTRASOUND GUIDANCE FOR VASCULAR ACCESS  08/19/2017   Procedure: Ultrasound Guidance For Vascular Access;  Surgeon: Belva Crome, MD;  Location: [place] [facility];  Service: Cardiovascular;;    There were no vitals filed for this visit.  Subjective Assessment - 11/22/18 1437    Subjective  My arms are doing fine.  "The fluid is overflowing into my abdomen".  My arms are still tight in the morning but I can stretch them out.  I never got my compression sleeves.  (PT sent email to [facility] just now)  Denies swelling in the legs.  The arm swell intermittently.  I do get some SOB with the abdominal swelling but it doesn't seem CHF related    Pertinent History   Pt had breast cancer diagnosed in May 2019 with 2 blisters popped up on left breast that got worse in June, She was diagnosed with left inflammatory breast cancer and had right axillay node involvment also.  Pt has bilateral mastectomy on October 17 with 1/2 nodes positive on left and 7/12/ nodes positive on right  She underwent chemotherapy from July - Sept and had some problems with fatigue and tingling in hands and fingertips She was not able to get the full does of chemo due to CHF.  The surgery was a success and she recently completed radiation 11/09/18.  Past history includes lumpectomy and radiation to left in 2006, CHF. lower back pain that causes her to walk with a cane  Past history also includes diabetes and high blood pressure, pt had drains removed from mastectomy on 09/05/18    Patient Stated Goals  to be able to get my limbs back in circulation and get rid of this fluid    Currently in Pain?  No/denies         [facility] PT Assessment - 11/22/18 0001      Assessment   Medical Diagnosis  bilateral breast cancer     Referring Provider (PT)  Dr. Lindi Adie    Onset Date/Surgical Date  07/20/18    Hand Dominance  Right      Precautions   Precaution Comments  lymphedema bilateral      Restrictions   Weight Bearing Restrictions  No      Balance Screen   Has the patient fallen in the past 6 months  No    Has the patient had a decrease in activity level  because of a fear of falling?   No    Is the patient reluctant to leave their home because of a fear of falling?   No      Home Environment   Living Environment  Private residence    Living Arrangements  Alone    Available Help at Discharge  Available PRN/intermittently    Type of [place]  Prior Function   Level of Independence  Independent with basic ADLs    Vocation  Retired      Associate Professor   Overall Cognitive Status  Within Functional Limits for tasks assessed      Observation/Other Assessments   Observations  uses SPC    Skin Integrity  see photo from today.  Recent bilateral chest radiation with significant blistering and peeling especially under the Lt axilla and chest.        Posture/Postural Control   Posture/Postural Control  Postural limitations    Postural Limitations  Rounded Shoulders;Forward head;Increased lumbar lordosis    Posture Comments  pt obese        LYMPHEDEMA/ONCOLOGY QUESTIONNAIRE - 11/22/18 1444      Right Upper Extremity Lymphedema   15 cm Proximal to Olecranon Process  44.4 cm    10 cm Proximal to Olecranon Process  42.2 cm    Olecranon Process  29.8 cm    15 cm Proximal to Ulnar Styloid Process  28 cm    Just Proximal to Ulnar Styloid Process  18.2 cm    Across Hand at [employer]  20.9 cm    At [place] of 2nd Digit  6.7 cm    Other  116.6cm around trunk 10cm proximal to the umbilicus (see photos)       Left Upper Extremity Lymphedema   15 cm Proximal to Olecranon Process  46 cm    10 cm Proximal to Olecranon Process  43.6 cm    Olecranon Process  29.6 cm    15 cm Proximal to Ulnar Styloid Process  29.9 cm    Just Proximal to Ulnar Styloid Process  19.3 cm    Across Hand at [employer]  20.1 cm    At [place] of 2nd Digit  6.5 cm                [facility] Adult PT Treatment/Exercise - 11/22/18 0001      Manual Therapy   Edema Management  discussed compression garment options showing pt andrea garment from wear ease but  pt reporting this is too much money for her.  Decided on a compression shirt from [employer] or [employer] mens size.  Also emailed [place] regarding the missing compression sleeves and if these funds could be used for a different garment if needed if they were never sent off               PT Short Term Goals - 08/02/18 2012      PT SHORT TERM GOAL #1   Title  Pt will report feelings of fullness in shoulders and trunk are decreased by at least 50% with manual lymph draiange and compression    Time  3    Period  Weeks    Status  New        PT Long Term Goals - 11/22/18 2041      PT LONG TERM GOAL #1   Title  Pt will be ind with final HEP for shoulder strength and ROM     Time  6    Period  Weeks    Status  New    Target Date  01/03/19      PT LONG TERM GOAL #2   Title  Pt will be ind with lymphedema self care for the UEs and abdomen including self MLD or pump use    Time  6    Period  Weeks  Status  New    Target Date  01/03/19      PT LONG TERM GOAL #3   Title  Pt will report no episodes of SOB and difficulty talking when the abdominal lymphedema exacerbates    Time  6    Status  New    Target Date  01/03/19      PT LONG TERM GOAL #4   Title  Pt will obtain appropriate compression garments for long term management     Time  6    Period  Weeks    Status  New    Target Date  01/03/19            Plan - 11/22/18 2033    Clinical Impression Statement  Pt returns today post radiation of bilateral chest regions with significant skin darkening, blistering, and a few open areas especially under the Lt axilla/chest wall.  Pt reports that she has been stretching out the shoulders as much as possible but they are still tight.  Pt is most concerned about the feeling of lymphedema fluid collection in the abdomen after significant skin breakdown due to radiation.  Pt also with some increased measurements in the Rt upper arm compared to last visit.  PT visits will  consist of MLD and self MLD education for the abdomen, garment education, as well as some progression of bilateral shoulder ROM  and stretching.    Clinical Presentation  Evolving    Clinical Presentation due to:  new radiation status    Clinical Decision Making  Moderate    PT Frequency  2x / week    PT Duration  6 weeks    PT Treatment/Interventions  ADLs/Self Care Home Management;Therapeutic activities;Patient/family education;Manual lymph drainage;DME Instruction;Neuromuscular re-education;Orthotic Fit/Training;Therapeutic exercise;Manual techniques;Passive range of motion;Taping;Compression bandaging;Scar mobilization    PT Next Visit Plan  remeasure shoulder ROM, give pt info on [employer] compression shirt and amazon options as PT forgot to give instructions to her upon leaving, abdominal MLD, send flexitouch demographics if approved by cardiologist    Recommended Other Services  PT emailed [facility] regarding missing arm garments 11/22/18, recommended off the shelf compression shirts as pt thinks she can not afford other options like wear ease, pt interested in flexitouch    Consulted and Agree with Plan of Care  Patient       Patient will benefit from skilled therapeutic intervention in order to improve the following deficits and impairments:  Decreased skin integrity, Decreased knowledge of use of DME, Increased fascial restricitons, Pain, Postural dysfunction, Decreased scar mobility, Decreased range of motion, Decreased strength, Impaired UE functional use, Obesity, Increased edema, Decreased knowledge of precautions  Visit Diagnosis: Postmastectomy lymphedema  Stiffness of right shoulder, not elsewhere classified  Stiffness of left shoulder joint  Aftercare following surgery for neoplasm     Problem List Patient Active Problem List   Diagnosis Date Noted  . Carcinoma of axillary tail of breast, right ([place]) 09/15/2018  . Cancer of left female breast  ([place]) 07/20/2018  .  Bilateral lower extremity edema 06/15/2018  . 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 Shortness of breath on exertion 06/15/2018  . Chronic diastolic heart failure ([place]) 06/15/2018  . Port-A-Cath in place 04/25/2018  . Hypertensive retinopathy 09/09/2017  . Elevated TSH 08/15/2015  . Prolonged Q-T interval on ECG 08/14/2015  . Diabetic retinopathy ([place]) 03/26/2015  . Abnormality of gait 01/06/2013  . CTS (carpal tunnel syndrome) 03/09/2012  . Urinary frequency 07/14/2011  . Chronic venous insufficiency   .  Routine health maintenance 03/09/2011  . CAD (coronary artery disease)   . Benign essential HTN   . Hyperlipidemia   . Colonic polyp   . Morbid obesity ([place])   . Chronic pain   . Malignant neoplasm of lower-inner quadrant of left breast in female, estrogen receptor positive ([place])   . Iron deficiency anemia   . Seasonal allergies   . OSA (obstructive sleep apnea) 02/16/2007  . Type II diabetes mellitus 10/04/2004    Shan Levans, PT 11/22/2018, 8:44 PM  [place], [place], [address] Phone: [phone]   Fax:  [phone]  Name: [name] MRN: [id number] Date of Birth: 11-15-1950

## 2018-11-23 NOTE — Telephone Encounter (Signed)
I have not Rx'd since 2016. Dr Marlou Starks or a mid level has Rx'd most recently. She should call them.

## 2018-11-26 ENCOUNTER — Other Ambulatory Visit: Payer: Self-pay | Admitting: Internal Medicine

## 2018-11-27 ENCOUNTER — Encounter: Payer: Self-pay | Admitting: Physical Therapy

## 2018-11-27 ENCOUNTER — Ambulatory Visit: Payer: Medicare Other | Admitting: Physical Therapy

## 2018-11-27 DIAGNOSIS — I972 Postmastectomy lymphedema syndrome: Secondary | ICD-10-CM

## 2018-11-27 DIAGNOSIS — M25612 Stiffness of left shoulder, not elsewhere classified: Secondary | ICD-10-CM | POA: Diagnosis not present

## 2018-11-27 DIAGNOSIS — M25611 Stiffness of right shoulder, not elsewhere classified: Secondary | ICD-10-CM | POA: Diagnosis not present

## 2018-11-27 DIAGNOSIS — M6281 Muscle weakness (generalized): Secondary | ICD-10-CM | POA: Diagnosis not present

## 2018-11-27 DIAGNOSIS — Z483 Aftercare following surgery for neoplasm: Secondary | ICD-10-CM

## 2018-11-27 NOTE — Therapy (Signed)
[place], [place], [address] Phone: [phone]   Fax:  [phone]  Physical Therapy Treatment  Progress Note Reporting Period 08/02/2018  to 11/27/2018  See note below for Objective Data and Assessment of Progress/Goals.       Patient Details  Name: [name] MRN: [id number] Date of Birth: Nov 15, 1950 Referring Provider (PT): Dr. Lindi Adie   Encounter Date: 11/27/2018  PT End of Session - 11/27/18 1710    Visit Number  10    Number of Visits  21    Date for PT Re-Evaluation  01/03/19    Authorization Type  Medicare    Activity Tolerance  Patient tolerated treatment well    Behavior During Therapy  [facility] for tasks assessed/performed       Past Medical History:  Diagnosis Date  . Arthritis    "knees, lower back" (08/18/2017)  . 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 S/p lumpectomy and radiation.  Marland Kitchen CAD (coronary artery disease) 11/07   cath 11/11 :  3V CADsee report   . Cataract    small both eyes  . Chronic lower back pain   . Chronic pain    MR 08/07/10 :  Spondylosis L4-5 with B foraminal narrowing and L4 nerve root enchroachment B.  . Chronic venous insufficiency 2012   Has had full W/U incl ECHO, LFT's, Cr, Umicro, TSH, BNP. Symptomatic treatment.  . Colonic polyp 1995   Colonoscopies 1994, 2000, and 02/02/2011. Last had 9 polyps - Tubular adenoma and tubulovillous was the pathology results without high grade dysplasia  . Diabetes mellitus type II    Insulin dependent. Has worked with [name] R previously.  . Diastolic CHF, chronic ([place])    NL EF by echo 01/2012, Gr I dd  . Essential hypertension    Requires 5 drug therapy and still difficult to control.  Marland Kitchen GERD (gastroesophageal reflux disease)   . Heart murmur   . Hyperlipidemia    On daily statin  . Iron deficiency anemia    Ferritin was 12 in 2012. on FeSo4. Has had colon  and EGD 02/02/11 that showed mild gastritis and colon polyps.  . Obesity    Morbid. HAs worked with [name] T previously.  . OSA (obstructive sleep apnea) 02/16/2007   02/2007 : Moderate AHI 35.6, O2 sat decreased to 65%, CPAP titration to 16 with AHI of 3.6. 02/2014 : Rare respiratory events with sleep disturbance, within normal limits. AHI 0.4 per hour      . OSA on CPAP   . Personal history of chemotherapy   . Personal history of radiation therapy    "to my left breast"  . Refusal of blood transfusions as patient is Jehovah's Witness   . Seasonal allergies    "grass pollen"    Past Surgical History:  Procedure Laterality Date  . BREAST LUMPECTOMY Left 2006  . BREAST LUMPECTOMY W/ NEEDLE LOCALIZATION  2006   34 Chemo RX  . CARDIAC CATHETERIZATION N/A 08/15/2015   Procedure: Left Heart Cath and Coronary Angiography;  Surgeon: Troy Sine, MD;  Location: [place] [facility];  Service: Cardiovascular;  Laterality: N/A;  . COLONOSCOPY W/ BIOPSIES AND POLYPECTOMY  1994, 2000, 2012, 26/018  . IR IMAGING GUIDED PORT INSERTION  04/17/2018  . IR REMOVAL TUN ACCESS W/ PORT W/O FL MOD SED  11/14/2018  . LEFT HEART CATH AND CORONARY ANGIOGRAPHY N/A 08/19/2017  Procedure: LEFT HEART CATH AND CORONARY ANGIOGRAPHY;  Surgeon: Belva Crome, MD;  Location: [place] [facility];  Service: Cardiovascular;  Laterality: N/A;  . MASTECTOMY MODIFIED RADICAL Bilateral 07/20/2018  . MASTECTOMY MODIFIED RADICAL Bilateral 07/20/2018   Procedure: BILATERAL MODIFIED RADICAL MASTECTOMIES;  Surgeon: Jovita Kussmaul, MD;  Location: [place];  Service: General;  Laterality: Bilateral;  . PORTACATH PLACEMENT N/A 04/14/2018   Procedure: ATTEMPED INSERTION PORT-A-CATH;  Surgeon: Jovita Kussmaul, MD;  [facility];  Service: General;  Laterality: N/A;  . TONSILLECTOMY AND ADENOIDECTOMY  1964  . TOTAL ABDOMINAL HYSTERECTOMY  1985   "I had endometrosis"  . ULTRASOUND GUIDANCE FOR VASCULAR ACCESS  08/19/2017   Procedure:  Ultrasound Guidance For Vascular Access;  Surgeon: Belva Crome, MD;  Location: [place] [facility];  Service: Cardiovascular;;    There were no vitals filed for this visit.  Subjective Assessment - 11/27/18 1314    Subjective  Pt states her legs are really hurting today, probably because of the weather.  She says she did not cancel her order for her sleeves and would like to get them remeasured.  She say she has fullness in her abdomen still and wants to get rid of it.     Pertinent History   Pt had breast cancer diagnosed in May 2019 with 2 blisters popped up on left breast that got worse in June, She was diagnosed with left inflammatory breast cancer and had right axillay node involvment also.  Pt has bilateral mastectomy on October 17 with 1/2 nodes positive on left and 7/12/ nodes positive on right  She underwent chemotherapy from July - Sept and had some problems with fatigue and tingling in hands and fingertips She was not able to get the full does of chemo due to CHF.  The surgery was a success and she recently completed radiation 11/09/18.  Past history includes lumpectomy and radiation to left in 2006, CHF. lower back pain that causes her to walk with a cane  Past history also includes diabetes and high blood pressure, pt had drains removed from mastectomy on 09/05/18    Patient Stated Goals  to be able to get my limbs back in circulation and get rid of this fluid    Currently in Pain?  Yes    Pain Score  8     Pain Location  Leg    Pain Orientation  Left    Pain Descriptors / Indicators  Aching    Pain Type  Chronic pain    Pain Onset  More than a month ago    Pain Frequency  Intermittent    Aggravating Factors   when she has to move, it draws up and it hurts to straighten it out.                        [facility] [facility] Adult PT Treatment/Exercise - 11/27/18 0001      Exercises   Exercises  Lumbar      Lumbar Exercises: Supine   Bent Knee Raise  10 reps    Bent Knee Raise  Limitations  with right leg only, left leg too painfu to lift    for abdominal activation      Shoulder Exercises: Supine   Other Supine Exercises  dowel rod flexion x 10 reps       Manual Therapy   Manual Therapy  Manual Lymphatic Drainage (MLD)    Edema Management  Put  athletic waist trimmer from [employer] around abdomen for gentle compression . pt stated she might put post op binder under axilla to top of binder. if tolerated     Manual Lymphatic Drainage (MLD)  diaphragmatic breaths, superficial and deep abdominals. right inguinal nodes. anterior and lateral abdomen with direction to inguinal nodes and to back. same on right side                PT Short Term Goals - 08/02/18 2012      PT SHORT TERM GOAL #1   Title  Pt will report feelings of fullness in shoulders and trunk are decreased by at least 50% with manual lymph draiange and compression    Time  3    Period  Weeks    Status  New        PT Long Term Goals - 11/22/18 2041      PT LONG TERM GOAL #1   Title  Pt will be ind with final HEP for shoulder strength and ROM     Time  6    Period  Weeks    Status  New    [place] Date  01/03/19      PT LONG TERM GOAL #2   Title  Pt will be ind with lymphedema self care for the UEs and abdomen including self MLD or pump use    Time  6    Period  Weeks    Status  New    Target Date  01/03/19      PT LONG TERM GOAL #3   Title  Pt will report no episodes of SOB and difficulty talking when the abdominal lymphedema exacerbates    Time  6    Status  New    Target Date  01/03/19      PT LONG TERM GOAL #4   Title  Pt will obtain appropriate compression garments for long term management     Time  6    Period  Weeks    Status  New    Target Date  01/03/19            Plan - 11/27/18 1710    Clinical Impression Statement  Pt continues to have signifcant abdominal swelling that did not repsond signifcantly to MLD session. She has firmer fullness in lower abdomen.  Suggested pt may try to put a folded incontinence pad in underwear at pubis to see if this area will soften Tried neoprene waist trimmer around abdomen for mild compression also.  Pt said she felt this support to abdomen was good.  Pt limited by left leg pain today     Clinical Impairments Affecting Rehab Potential  pt will be starting radiation    PT Treatment/Interventions  ADLs/Self Care Home Management;Therapeutic activities;Patient/family education;Manual lymph drainage;DME Instruction;Neuromuscular re-education;Orthotic Fit/Training;Therapeutic exercise;Manual techniques;Passive range of motion;Taping;Compression bandaging;Scar mobilization    PT Next Visit Plan  Check to see if abdominal compression helped remeasure shoulder ROM, give pt info on [employer] compression shirt and [employer] options as PT forgot to give instructions to her upon leaving, abdominal MLD, send flexitouch demographics if approved by cardiologist       Patient will benefit from skilled therapeutic intervention in order to improve the following deficits and impairments:  Decreased skin integrity, Decreased knowledge of use of DME, Increased fascial restricitons, Pain, Postural dysfunction, Decreased scar mobility, Decreased range of motion, Decreased strength, Impaired UE functional use, Obesity, Increased edema, Decreased knowledge of precautions  Visit Diagnosis: Postmastectomy lymphedema  Stiffness of right shoulder, not elsewhere classified  Stiffness of left shoulder joint  Aftercare following surgery for neoplasm  Muscle weakness (generalized)     Problem List Patient Active Problem List   Diagnosis Date Noted  . Carcinoma of axillary tail of breast, right ([place]) 09/15/2018  . Cancer of left female breast  ([place]) 07/20/2018  . Bilateral lower extremity edema 06/15/2018  . 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 Shortness of breath on exertion 06/15/2018  . Chronic diastolic heart failure ([place])  06/15/2018  . Port-A-Cath in place 04/25/2018  . Hypertensive retinopathy 09/09/2017  . Elevated TSH 08/15/2015  . Prolonged Q-T interval on ECG 08/14/2015  . Diabetic retinopathy ([place]) 03/26/2015  . Abnormality of gait 01/06/2013  . CTS (carpal tunnel syndrome) 03/09/2012  . Urinary frequency 07/14/2011  . Chronic venous insufficiency   . Routine health maintenance 03/09/2011  . CAD (coronary artery disease)   . Benign essential HTN   . Hyperlipidemia   . Colonic polyp   . Morbid obesity ([place])   . Chronic pain   . Malignant neoplasm of lower-inner quadrant of left breast in female, estrogen receptor positive ([place])   . Iron deficiency anemia   . Seasonal allergies   . OSA (obstructive sleep apnea) 02/16/2007  . Type II diabetes mellitus 10/04/2004   Donato Heinz. Owens Shark PT  Norwood Levo 11/27/2018, 5:14 PM  [place], [place], [address] Phone: [phone]   Fax:  [phone]  Name: [name] MRN: [id number] Date of Birth: Feb 07, 1951

## 2018-11-29 ENCOUNTER — Encounter: Payer: Self-pay | Admitting: Internal Medicine

## 2018-11-30 ENCOUNTER — Ambulatory Visit (INDEPENDENT_AMBULATORY_CARE_PROVIDER_SITE_OTHER): Payer: Medicare Other | Admitting: Internal Medicine

## 2018-11-30 ENCOUNTER — Other Ambulatory Visit: Payer: Self-pay

## 2018-11-30 VITALS — BP 164/73 | HR 76 | Temp 98.0°F | Wt 209.4 lb

## 2018-11-30 DIAGNOSIS — G894 Chronic pain syndrome: Secondary | ICD-10-CM

## 2018-11-30 DIAGNOSIS — Z170 Estrogen receptor positive status [ER+]: Secondary | ICD-10-CM | POA: Diagnosis not present

## 2018-11-30 DIAGNOSIS — Z79899 Other long term (current) drug therapy: Secondary | ICD-10-CM | POA: Diagnosis not present

## 2018-11-30 DIAGNOSIS — Z794 Long term (current) use of insulin: Secondary | ICD-10-CM | POA: Diagnosis not present

## 2018-11-30 DIAGNOSIS — E11319 Type 2 diabetes mellitus with unspecified diabetic retinopathy without macular edema: Secondary | ICD-10-CM

## 2018-11-30 DIAGNOSIS — Z6839 Body mass index (BMI) 39.0-39.9, adult: Secondary | ICD-10-CM | POA: Diagnosis not present

## 2018-11-30 DIAGNOSIS — G8929 Other chronic pain: Secondary | ICD-10-CM | POA: Diagnosis not present

## 2018-11-30 DIAGNOSIS — Z7982 Long term (current) use of aspirin: Secondary | ICD-10-CM

## 2018-11-30 DIAGNOSIS — Z923 Personal history of irradiation: Secondary | ICD-10-CM

## 2018-11-30 DIAGNOSIS — E1165 Type 2 diabetes mellitus with hyperglycemia: Secondary | ICD-10-CM

## 2018-11-30 DIAGNOSIS — C50312 Malignant neoplasm of lower-inner quadrant of left female breast: Secondary | ICD-10-CM | POA: Diagnosis not present

## 2018-11-30 DIAGNOSIS — IMO0002 Reserved for concepts with insufficient information to code with codable children: Secondary | ICD-10-CM

## 2018-11-30 DIAGNOSIS — Z9221 Personal history of antineoplastic chemotherapy: Secondary | ICD-10-CM | POA: Diagnosis not present

## 2018-11-30 DIAGNOSIS — I10 Essential (primary) hypertension: Secondary | ICD-10-CM | POA: Diagnosis not present

## 2018-11-30 DIAGNOSIS — E1140 Type 2 diabetes mellitus with diabetic neuropathy, unspecified: Secondary | ICD-10-CM

## 2018-11-30 LAB — POCT GLYCOSYLATED HEMOGLOBIN (HGB A1C): Hemoglobin A1C: 8.1 % — AB (ref 4.0–5.6)

## 2018-11-30 LAB — GLUCOSE, CAPILLARY: Glucose-Capillary: 177 mg/dL — ABNORMAL HIGH (ref 70–99)

## 2018-11-30 MED ORDER — INSULIN ASPART 100 UNIT/ML FLEXPEN: PEN_INJECTOR | SUBCUTANEOUS | 3 refills | Status: DC

## 2018-11-30 MED ORDER — DICLOFENAC EPOLAMINE 1.3 % TD PTCH: 1.0000 | MEDICATED_PATCH | Freq: Two times a day (BID) | TRANSDERMAL | 5 refills | Status: DC

## 2018-11-30 NOTE — Patient Instructions (Signed)
1. Increase your novolog to 6 units before breakfast and lunch 2. Continue 5 units novolog with dinner 3. Follow your follow your blood pressures 4. I sent in your pain patches. Let me know if your insurance doesn't cover.

## 2018-11-30 NOTE — Assessment & Plan Note (Signed)
This problem is chronic and improved.  She is on Toujeo 30 units once a day and NovoLog 5 units before meals.  She is also on Metformin 1000 BID.  Her A1c trend has been 7.9 - 8.3 - 8.9 - 8.1 today.  She brought her meter.  Earlier this month, she traveled out of town for a wedding and to visit family out of town and had some rather high levels which significantly increased her average.  Just looking at the most recent sugars, she does not have any hypoglycemia and most are falling between 150 and 200.  I do not want to increase her long-acting due to the imbalance already between her long-acting and short acting insulin and I would also be concerned about hypoglycemia.  Therefore, we are increasing her NovoLog to 6 units before breakfast and lunch which are HER-2 biggest meals.  She will remain on 5 units before dinner because that is a smaller meal.  My A1c goal would be less than 8 due to age and comorbidities.  PLAN :  Toujeo 30 units Metformin 1000 BID NovoLog 6 units with breakfast and lunch and 5 units with dinner Return to clinic in 3 months

## 2018-11-30 NOTE — Progress Notes (Signed)
   Subjective:    Patient ID: [name], female    DOB: 1951-06-25, 68 y.o.   MRN: [id number]  HPI  [name] is here for DM F/U. Please see the A&P for the status of the pt's chronic medical problems.  ROS : per ROS section and in problem oriented charting. All other systems are negative.  PMHx, Soc hx, and / or Fam hx : I reviewed her chart and verified her recent cancer history with the patient.  She is now status post bilateral mastectomy, chemotherapy, and radiation.  She had significant side effects to radiation and chemotherapy.  It is planned that she start an antiestrogen in the not too distant future.  She verifies this history and states that her pain which was most significant in her hands and feet along with discoloration of her hands and feet are getting better.  She also states that she got burns across her chest but that is also getting better.  Review of Systems  Constitutional: Negative for unexpected weight change.  Cardiovascular: Positive for chest pain and leg swelling.  Gastrointestinal: Positive for abdominal distention, nausea and vomiting.       GI symptoms only with certain foods  Musculoskeletal:       Bilateral upper extremities feel tight  Skin: Positive for color change and wound.       Objective:   Physical Exam Vitals signs and nursing note reviewed.  Constitutional:      General: She is not in acute distress.    Appearance: Normal appearance. She is obese. She is not ill-appearing, toxic-appearing or diaphoretic.  HENT:     Head: Normocephalic and atraumatic.     Right Ear: External ear normal.     Left Ear: External ear normal.     Nose: Nose normal. No congestion or rhinorrhea.  Eyes:     Extraocular Movements: Extraocular movements intact.     Conjunctiva/sclera: Conjunctivae normal.  Cardiovascular:     Rate and Rhythm: Normal rate and regular rhythm.     Heart sounds: No murmur.  Pulmonary:     Effort: Pulmonary effort is  normal. No respiratory distress.     Breath sounds: Normal breath sounds.  Musculoskeletal:     Right lower leg: Edema present.     Left lower leg: Edema present.     Comments: +1 pitting edema bilaterally to pretibial  Neurological:     General: No focal deficit present.     Mental Status: She is alert. Mental status is at baseline.  Psychiatric:        Mood and Affect: Mood normal.        Behavior: Behavior normal.        Thought Content: Thought content normal.        Judgment: Judgment normal.           Assessment & Plan:

## 2018-11-30 NOTE — Assessment & Plan Note (Signed)
This problem is chronic and uncontrolled.  We reviewed her tramadol history and I last prescribed it in 2014.  She recently had a prescribed by her surgeon and a midlevel at oncology.  Since I have not been involved with her cancer treatment, I referred her back to either of them for refills.

## 2018-11-30 NOTE — Assessment & Plan Note (Signed)
This problem is chronic and stable.  It is being managed by her oncology team.  Since her surgery, she states that she has fluid collecting in her chest but then drains into her abdomen, groin, and thighs.  She states her oncologist has referred her to PT to help mobilize the fluid better.  She is also using lower extremity stockings.

## 2018-11-30 NOTE — Assessment & Plan Note (Signed)
This problem is chronic and uncontrolled.  She works extensively with our nutritionist and diabetes educator and is really trying.  She states she never lost her appetite during chemo and radiation and instead had an increased appetite.  Despite that, she has lost some weight recently.  PLAN: Follow  Wt Readings from Last 3 Encounters:  11/30/18 209 lb 6.4 oz (95 kg)  11/14/18 214 lb (97.1 kg)  11/09/18 214 lb 6.4 oz (97.3 kg)

## 2018-11-30 NOTE — Assessment & Plan Note (Signed)
This problem is chronic and uncontrolled.  Her medical regimen includes Norvasc 10 mg, furosemide 20 daily, Imdur 120 twice daily, Cozaar 50 daily, metoprolol 100 daily, and spironolactone 25 daily.  Pressure is elevated today and she states that it is because she is in pain.  It has been elevated over the past 4 or 5 appointments.  She states this is due to pain.  We discussed options and she chose to leave medicines as is and try to control her pain better rather than increasing her Spironolactone or Cozaar.  PLAN:  Cont current meds

## 2018-12-01 ENCOUNTER — Ambulatory Visit: Payer: Medicare Other | Admitting: Physical Therapy

## 2018-12-01 ENCOUNTER — Other Ambulatory Visit: Payer: Self-pay

## 2018-12-01 ENCOUNTER — Telehealth: Payer: Self-pay | Admitting: *Deleted

## 2018-12-01 ENCOUNTER — Encounter: Payer: Self-pay | Admitting: Physical Therapy

## 2018-12-01 DIAGNOSIS — I972 Postmastectomy lymphedema syndrome: Secondary | ICD-10-CM | POA: Diagnosis not present

## 2018-12-01 DIAGNOSIS — M25611 Stiffness of right shoulder, not elsewhere classified: Secondary | ICD-10-CM | POA: Diagnosis not present

## 2018-12-01 DIAGNOSIS — Z483 Aftercare following surgery for neoplasm: Secondary | ICD-10-CM | POA: Diagnosis not present

## 2018-12-01 DIAGNOSIS — M25612 Stiffness of left shoulder, not elsewhere classified: Secondary | ICD-10-CM | POA: Diagnosis not present

## 2018-12-01 DIAGNOSIS — M6281 Muscle weakness (generalized): Secondary | ICD-10-CM | POA: Diagnosis not present

## 2018-12-01 NOTE — Therapy (Signed)
[place], [place], [address] Phone: [phone]   Fax:  [phone]  Physical Therapy Treatment  Patient Details  Name: [name] MRN: [id number] Date of Birth: 1951/05/09 Referring Provider (PT): Dr. Lindi Adie   Encounter Date: 12/01/2018  PT End of Session - 12/01/18 1147    Visit Number  11    Number of Visits  21    Date for PT Re-Evaluation  01/03/19    Authorization Type  Medicare    PT Start Time  1117   transportation issues   PT Stop Time  1147    PT Time Calculation (min)  30 min    Activity Tolerance  Patient tolerated treatment well    Behavior During Therapy  [facility] for tasks assessed/performed       Past Medical History:  Diagnosis Date  . Arthritis    "knees, lower back" (08/18/2017)  . 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 S/p lumpectomy and radiation.  Marland Kitchen CAD (coronary artery disease) 11/07   cath 11/11 :  3V CADsee report   . Cataract    small both eyes  . Chronic lower back pain   . Chronic pain    MR 08/07/10 :  Spondylosis L4-5 with B foraminal narrowing and L4 nerve root enchroachment B.  . Chronic venous insufficiency 2012   Has had full W/U incl ECHO, LFT's, Cr, Umicro, TSH, BNP. Symptomatic treatment.  . Colonic polyp 1995   Colonoscopies 1994, 2000, and 02/02/2011. Last had 9 polyps - Tubular adenoma and tubulovillous was the pathology results without high grade dysplasia  . Diabetes mellitus type II    Insulin dependent. Has worked with [name] R previously.  . Diastolic CHF, chronic ([place])    NL EF by echo 01/2012, Gr I dd  . Essential hypertension    Requires 5 drug therapy and still difficult to control.  Marland Kitchen GERD (gastroesophageal reflux disease)   . Heart murmur   . Hyperlipidemia    On daily statin  . Iron deficiency anemia    Ferritin was 12 in 2012. on FeSo4. Has had colon and EGD 02/02/11 that showed mild  gastritis and colon polyps.  . Obesity    Morbid. HAs worked with [name] T previously.  . OSA (obstructive sleep apnea) 02/16/2007   02/2007 : Moderate AHI 35.6, O2 sat decreased to 65%, CPAP titration to 16 with AHI of 3.6. 02/2014 : Rare respiratory events with sleep disturbance, within normal limits. AHI 0.4 per hour      . OSA on CPAP   . Personal history of chemotherapy   . Personal history of radiation therapy    "to my left breast"  . Refusal of blood transfusions as patient is Jehovah's Witness   . Seasonal allergies    "grass pollen"    Past Surgical History:  Procedure Laterality Date  . BREAST LUMPECTOMY Left 2006  . BREAST LUMPECTOMY W/ NEEDLE LOCALIZATION  2006   34 Chemo RX  . CARDIAC CATHETERIZATION N/A 08/15/2015   Procedure: Left Heart Cath and Coronary Angiography;  Surgeon: Troy Sine, MD;  Location: [place] [facility];  Service: Cardiovascular;  Laterality: N/A;  . COLONOSCOPY W/ BIOPSIES AND POLYPECTOMY  1994, 2000, 2012, 26/018  . IR IMAGING GUIDED PORT INSERTION  04/17/2018  . IR REMOVAL TUN ACCESS W/ PORT W/O FL MOD SED  11/14/2018  . LEFT HEART CATH AND CORONARY ANGIOGRAPHY  N/A 08/19/2017   Procedure: LEFT HEART CATH AND CORONARY ANGIOGRAPHY;  Surgeon: Belva Crome, MD;  Location: [place] [facility];  Service: Cardiovascular;  Laterality: N/A;  . MASTECTOMY MODIFIED RADICAL Bilateral 07/20/2018  . MASTECTOMY MODIFIED RADICAL Bilateral 07/20/2018   Procedure: BILATERAL MODIFIED RADICAL MASTECTOMIES;  Surgeon: Jovita Kussmaul, MD;  Location: [place];  Service: General;  Laterality: Bilateral;  . PORTACATH PLACEMENT N/A 04/14/2018   Procedure: ATTEMPED INSERTION PORT-A-CATH;  Surgeon: Jovita Kussmaul, MD;  Location: [facility];  Service: General;  Laterality: N/A;  . TONSILLECTOMY AND ADENOIDECTOMY  1964  . TOTAL ABDOMINAL HYSTERECTOMY  1985   "I had endometrosis"  . ULTRASOUND GUIDANCE FOR VASCULAR ACCESS  08/19/2017   Procedure: Ultrasound Guidance For Vascular  Access;  Surgeon: Belva Crome, MD;  Location: [place] [facility];  Service: Cardiovascular;;    There were no vitals filed for this visit.  Subjective Assessment - 12/01/18 1121    Subjective  The abdominal binder is helping. I went to the doctor yesterday and I was 209.4 before I was 215 or 218.     Pertinent History   Pt had breast cancer diagnosed in May 2019 with 2 blisters popped up on left breast that got worse in June, She was diagnosed with left inflammatory breast cancer and had right axillay node involvment also.  Pt has bilateral mastectomy on October 17 with 1/2 nodes positive on left and 7/12/ nodes positive on right  She underwent chemotherapy from July - Sept and had some problems with fatigue and tingling in hands and fingertips She was not able to get the full does of chemo due to CHF.  The surgery was a success and she recently completed radiation 11/09/18.  Past history includes lumpectomy and radiation to left in 2006, CHF. lower back pain that causes her to walk with a cane  Past history also includes diabetes and high blood pressure, pt had drains removed from mastectomy on 09/05/18    Patient Stated Goals  to be able to get my limbs back in circulation and get rid of this fluid    Currently in Pain?  Yes    Pain Score  4     Pain Location  Leg    Pain Orientation  Left    Pain Descriptors / Indicators  Aching                       [facility] Adult PT Treatment/Exercise - 12/01/18 0001      Manual Therapy   Manual Therapy  Manual Lymphatic Drainage (MLD)    Manual Lymphatic Drainage (MLD)  diaphragmatic breaths, superficial and deep abdominals. right inguinal nodes. anterior and lateral abdomen with direction to inguinal nodes and to back. same on right side                PT Short Term Goals - 08/02/18 2012      PT SHORT TERM GOAL #1   Title  Pt will report feelings of fullness in shoulders and trunk are decreased by at least 50% with manual lymph  draiange and compression    Time  3    Period  Weeks    Status  New        PT Long Term Goals - 11/22/18 2041      PT LONG TERM GOAL #1   Title  Pt will be ind with final HEP for shoulder strength and ROM  Time  6    Period  Weeks    Status  New    Target Date  01/03/19      PT LONG TERM GOAL #2   Title  Pt will be ind with lymphedema self care for the UEs and abdomen including self MLD or pump use    Time  6    Period  Weeks    Status  New    Target Date  01/03/19      PT LONG TERM GOAL #3   Title  Pt will report no episodes of SOB and difficulty talking when the abdominal lymphedema exacerbates    Time  6    Status  New    Target Date  01/03/19      PT LONG TERM GOAL #4   Title  Pt will obtain appropriate compression garments for long term management     Time  6    Period  Weeks    Status  New    Target Date  01/03/19            Plan - 12/01/18 1147    Clinical Impression Statement  Shortned session because pt arrived late due to transportation isues. Continued MLD today to abdomen moving fluid towards inguinal nodes. Pt states it is helping and so is the abdominal binder because she has lost weight.     Rehab Potential  Good    Clinical Impairments Affecting Rehab Potential  pt will be starting radiation    PT Frequency  2x / week    PT Duration  6 weeks    PT Treatment/Interventions  ADLs/Self Care Home Management;Therapeutic activities;Patient/family education;Manual lymph drainage;DME Instruction;Neuromuscular re-education;Orthotic Fit/Training;Therapeutic exercise;Manual techniques;Passive range of motion;Taping;Compression bandaging;Scar mobilization    PT Next Visit Plan  Check to see if abdominal compression helped remeasure shoulder ROM, give pt info on [employer] compression shirt and [employer] options as PT forgot to give instructions to her upon leaving, abdominal MLD, send flexitouch demographics if approved by cardiologist    PT Home Exercise  Plan  post op breast    Consulted and Agree with Plan of Care  Patient       Patient will benefit from skilled therapeutic intervention in order to improve the following deficits and impairments:  Decreased skin integrity, Decreased knowledge of use of DME, Increased fascial restricitons, Pain, Postural dysfunction, Decreased scar mobility, Decreased range of motion, Decreased strength, Impaired UE functional use, Obesity, Increased edema, Decreased knowledge of precautions  Visit Diagnosis: Postmastectomy lymphedema     Problem List Patient Active Problem List   Diagnosis Date Noted  . Bilateral lower extremity edema 06/15/2018  . 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 Chronic diastolic heart failure ([place]) 06/15/2018  . Port-A-Cath in place 04/25/2018  . Hypertensive retinopathy 09/09/2017  . Elevated TSH 08/15/2015  . Prolonged Q-T interval on ECG 08/14/2015  . Diabetic retinopathy ([place]) 03/26/2015  . Abnormality of gait 01/06/2013  . CTS (carpal tunnel syndrome) 03/09/2012  . Urinary frequency 07/14/2011  . Chronic venous insufficiency   . Routine health maintenance 03/09/2011  . CAD (coronary artery disease)   . Benign essential HTN   . Hyperlipidemia   . Colonic polyp   . Morbid obesity ([place])   . Chronic pain   . Malignant neoplasm of lower-inner quadrant of left breast in female, estrogen receptor positive ([place])   . Iron deficiency anemia   . Seasonal allergies   . OSA (obstructive sleep apnea) 02/16/2007  . DM  type 2, uncontrolled, with retinopathy ([place]) 10/04/2004    Allyson Sabal [facility] 12/01/2018, 11:50 AM  [place], [place], [address] Phone: [phone]   Fax:  [phone]  Name: AKIRAH STORCK MRN: [id number] Date of Birth: October 18, 1950  Manus Gunning, PT 12/01/18 11:51 AM

## 2018-12-01 NOTE — Telephone Encounter (Addendum)
Information was sent through CoverMyMeds for PA for Diclofenac Patches 1.3%.  Awaitting determination from [facility]  [phone]. Sander Nephew, RN  Diclofenac Patches 1.3% approved 12/01/2018 thru 10/04/2019  Sander Nephew, RN 12/26/2018 2:14 PM.

## 2018-12-04 ENCOUNTER — Encounter: Payer: Self-pay | Admitting: Radiation Oncology

## 2018-12-04 NOTE — Progress Notes (Signed)
[name] presents for follow up of radiation completed 11/09/18 to her Right and Left chest wall. She last saw Dr. Lindi Adie on 11/09/18 and will see him again on 12/21/18. She plans to begin Anastrozole about that same time. She is seeing  PT for lymphedema treatment two days a week for fluid in abdomen. Her radiation site has healed well. She has dry skin present and old peeling visible. She is using radiaplex but is almost complete. She is using hydrocortisone for itching to her bilateral chest. She continues to use neosporin to her bilateral axilla and central chest that developed peeling during and after radiation treatment.   BP (!) 158/81   Pulse 69   Temp 97.8 F (36.6 C) (Oral)   Resp 18   Wt 205 lb 12.8 oz (93.4 kg)   LMP 07/12/1984   SpO2 99% Comment: room air  BMI 38.89 kg/m    Wt Readings from Last 3 Encounters:  12/08/18 205 lb 12.8 oz (93.4 kg)  11/30/18 209 lb 6.4 oz (95 kg)  11/14/18 214 lb (97.1 kg)

## 2018-12-05 ENCOUNTER — Other Ambulatory Visit: Payer: Self-pay

## 2018-12-05 ENCOUNTER — Encounter: Payer: Self-pay | Admitting: Physical Therapy

## 2018-12-05 ENCOUNTER — Ambulatory Visit: Payer: Medicare Other | Attending: Hematology and Oncology | Admitting: Physical Therapy

## 2018-12-05 DIAGNOSIS — I972 Postmastectomy lymphedema syndrome: Secondary | ICD-10-CM | POA: Insufficient documentation

## 2018-12-05 DIAGNOSIS — M6281 Muscle weakness (generalized): Secondary | ICD-10-CM | POA: Diagnosis not present

## 2018-12-05 DIAGNOSIS — M25612 Stiffness of left shoulder, not elsewhere classified: Secondary | ICD-10-CM | POA: Diagnosis not present

## 2018-12-05 DIAGNOSIS — Z483 Aftercare following surgery for neoplasm: Secondary | ICD-10-CM | POA: Insufficient documentation

## 2018-12-05 DIAGNOSIS — M25611 Stiffness of right shoulder, not elsewhere classified: Secondary | ICD-10-CM | POA: Diagnosis not present

## 2018-12-05 NOTE — Therapy (Signed)
[place], [place], [address] Phone: [phone]   Fax:  [phone]  Physical Therapy Treatment  Patient Details  Name: [name] MRN: [id number] Date of Birth: January 08, 1951 Referring Provider (PT): Dr. Lindi Adie   Encounter Date: 12/05/2018  PT End of Session - 12/05/18 1108    Visit Number  12    Number of Visits  21    Date for PT Re-Evaluation  01/03/19    Authorization Type  Medicare    PT Start Time  0852    PT Stop Time  0931    PT Time Calculation (min)  39 min    Activity Tolerance  Patient tolerated treatment well    Behavior During Therapy  [facility] for tasks assessed/performed       Past Medical History:  Diagnosis Date  . Arthritis    "knees, lower back" (08/18/2017)  . 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 S/p lumpectomy and radiation.  Marland Kitchen CAD (coronary artery disease) 11/07   cath 11/11 :  3V CADsee report   . Cataract    small both eyes  . Chronic lower back pain   . Chronic pain    MR 08/07/10 :  Spondylosis L4-5 with B foraminal narrowing and L4 nerve root enchroachment B.  . Chronic venous insufficiency 2012   Has had full W/U incl ECHO, LFT's, Cr, Umicro, TSH, BNP. Symptomatic treatment.  . Colonic polyp 1995   Colonoscopies 1994, 2000, and 02/02/2011. Last had 9 polyps - Tubular adenoma and tubulovillous was the pathology results without high grade dysplasia  . Diabetes mellitus type II    Insulin dependent. Has worked with [name] R previously.  . Diastolic CHF, chronic ([place])    NL EF by echo 01/2012, Gr I dd  . Essential hypertension    Requires 5 drug therapy and still difficult to control.  Marland Kitchen GERD (gastroesophageal reflux disease)   . Heart murmur   . History of radiation therapy 10/02/18- 11/09/18   Left chest wall SCV, Left chest wall boost, Right chest wall SCV, right chest boost all received 28 fractions for a total  dose of 50.04 Gy  . Hyperlipidemia    On daily statin  . Iron deficiency anemia    Ferritin was 12 in 2012. on FeSo4. Has had colon and EGD 02/02/11 that showed mild gastritis and colon polyps.  . Obesity    Morbid. HAs worked with [name] T previously.  . OSA (obstructive sleep apnea) 02/16/2007   02/2007 : Moderate AHI 35.6, O2 sat decreased to 65%, CPAP titration to 16 with AHI of 3.6. 02/2014 : Rare respiratory events with sleep disturbance, within normal limits. AHI 0.4 per hour      . OSA on CPAP   . Personal history of chemotherapy   . Personal history of radiation therapy    "to my left breast"  . Refusal of blood transfusions as patient is Jehovah's Witness   . Seasonal allergies    "grass pollen"    Past Surgical History:  Procedure Laterality Date  . BREAST LUMPECTOMY Left 2006  . BREAST LUMPECTOMY W/ NEEDLE LOCALIZATION  2006   34 Chemo RX  . CARDIAC CATHETERIZATION N/A 08/15/2015   Procedure: Left Heart Cath and Coronary Angiography;  Surgeon: Troy Sine, MD;  Location: [facility];  Service: Cardiovascular;  Laterality: N/A;  . COLONOSCOPY W/ BIOPSIES AND POLYPECTOMY  1994, 2000,  2012, 26/018  . IR IMAGING GUIDED PORT INSERTION  04/17/2018  . IR REMOVAL TUN ACCESS W/ PORT W/O FL MOD SED  11/14/2018  . LEFT HEART CATH AND CORONARY ANGIOGRAPHY N/A 08/19/2017   Procedure: LEFT HEART CATH AND CORONARY ANGIOGRAPHY;  Surgeon: Belva Crome, MD;  Location: [facility];  Service: Cardiovascular;  Laterality: N/A;  . MASTECTOMY MODIFIED RADICAL Bilateral 07/20/2018  . MASTECTOMY MODIFIED RADICAL Bilateral 07/20/2018   Procedure: BILATERAL MODIFIED RADICAL MASTECTOMIES;  Surgeon: Jovita Kussmaul, MD;  Location: [facility];  Service: General;  Laterality: Bilateral;  . PORTACATH PLACEMENT N/A 04/14/2018   Procedure: ATTEMPED INSERTION PORT-A-CATH;  Surgeon: Jovita Kussmaul, MD;  [facility];  Service: General;  Laterality: N/A;  . TONSILLECTOMY AND ADENOIDECTOMY  1964  .  TOTAL ABDOMINAL HYSTERECTOMY  1985   "I had endometrosis"  . ULTRASOUND GUIDANCE FOR VASCULAR ACCESS  08/19/2017   Procedure: Ultrasound Guidance For Vascular Access;  Surgeon: Belva Crome, MD;  Location: [facility];  Service: Cardiovascular;;    There were no vitals filed for this visit.  Subjective Assessment - 12/05/18 0853    Subjective  My swelling is going down some. I have been doing my massages.     Pertinent History   Pt had breast cancer diagnosed in May 2019 with 2 blisters popped up on left breast that got worse in June, She was diagnosed with left inflammatory breast cancer and had right axillay node involvment also.  Pt has bilateral mastectomy on October 17 with 1/2 nodes positive on left and 7/12/ nodes positive on right  She underwent chemotherapy from July - Sept and had some problems with fatigue and tingling in hands and fingertips She was not able to get the full does of chemo due to CHF.  The surgery was a success and she recently completed radiation 11/09/18.  Past history includes lumpectomy and radiation to left in 2006, CHF. lower back pain that causes her to walk with a cane  Past history also includes diabetes and high blood pressure, pt had drains removed from mastectomy on 09/05/18    Patient Stated Goals  to be able to get my limbs back in circulation and get rid of this fluid    Currently in Pain?  No/denies    Pain Score  0-No pain                       [facility] Adult PT Treatment/Exercise - 12/05/18 0001      Manual Therapy   Manual Therapy  Manual Lymphatic Drainage (MLD)    Manual Lymphatic Drainage (MLD)  diaphragmatic breaths, superficial and deep abdominals. right inguinal nodes. anterior and lateral abdomen with direction to inguinal nodes and to back. same on right side                PT Short Term Goals - 08/02/18 2012      PT SHORT TERM GOAL #1   Title  Pt will report feelings of fullness in shoulders and trunk are  decreased by at least 50% with manual lymph draiange and compression    Time  3    Period  Weeks    Status  New        PT Long Term Goals - 11/22/18 2041      PT LONG TERM GOAL #1   Title  Pt will be ind with final HEP for shoulder strength and ROM  Time  6    Period  Weeks    Status  New    Target Date  01/03/19      PT LONG TERM GOAL #2   Title  Pt will be ind with lymphedema self care for the UEs and abdomen including self MLD or pump use    Time  6    Period  Weeks    Status  New    Target Date  01/03/19      PT LONG TERM GOAL #3   Title  Pt will report no episodes of SOB and difficulty talking when the abdominal lymphedema exacerbates    Time  6    Status  New    Target Date  01/03/19      PT LONG TERM GOAL #4   Title  Pt will obtain appropriate compression garments for long term management     Time  6    Period  Weeks    Status  New    Target Date  01/03/19            Plan - 12/05/18 0932    Clinical Impression Statement  Continued with MLD to abdomen. Pt states she is compliant with self MLD at home and feels it is working. She is having a lot of tightness across her chest and her bilateral shoulder ROM is very limited secondary to this. Pt would benefit from manual therapy to decrease pec tightness and improve bilateral shoulder ROM.     Rehab Potential  Good    Clinical Impairments Affecting Rehab Potential  pt will be starting radiation    PT Frequency  2x / week    PT Duration  6 weeks    PT Treatment/Interventions  ADLs/Self Care Home Management;Therapeutic activities;Patient/family education;Manual lymph drainage;DME Instruction;Neuromuscular re-education;Orthotic Fit/Training;Therapeutic exercise;Manual techniques;Passive range of motion;Taping;Compression bandaging;Scar mobilization    PT Next Visit Plan  emailed Helene Kelp about Flexi status, begin focusing of chest tightness and add goal, give pt info on [employer] compression shirt and [employer]  options as PT forgot to give instructions to her upon leaving, abdominal MLD, send flexitouch demographics if approved by cardiologist    PT Home Exercise Plan  post op breast    Consulted and Agree with Plan of Care  Patient       Patient will benefit from skilled therapeutic intervention in order to improve the following deficits and impairments:  Decreased skin integrity, Decreased knowledge of use of DME, Increased fascial restricitons, Pain, Postural dysfunction, Decreased scar mobility, Decreased range of motion, Decreased strength, Impaired UE functional use, Obesity, Increased edema, Decreased knowledge of precautions  Visit Diagnosis: Postmastectomy lymphedema     Problem List Patient Active Problem List   Diagnosis Date Noted  . Bilateral lower extremity edema 06/15/2018  . 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 Chronic diastolic heart failure ([place]) 06/15/2018  . Port-A-Cath in place 04/25/2018  . Hypertensive retinopathy 09/09/2017  . Elevated TSH 08/15/2015  . Prolonged Q-T interval on ECG 08/14/2015  . Diabetic retinopathy ([place]) 03/26/2015  . Abnormality of gait 01/06/2013  . CTS (carpal tunnel syndrome) 03/09/2012  . Urinary frequency 07/14/2011  . Chronic venous insufficiency   . Routine health maintenance 03/09/2011  . CAD (coronary artery disease)   . Benign essential HTN   . Hyperlipidemia   . Colonic polyp   . Morbid obesity ([place])   . Chronic pain   . Malignant neoplasm of lower-inner quadrant of left breast in female, estrogen receptor positive (  [facility])   . Iron deficiency anemia   . Seasonal allergies   . OSA (obstructive sleep apnea) 02/16/2007  . DM type 2, uncontrolled, with retinopathy ([place]) 10/04/2004    Allyson Sabal [facility] 12/05/2018, 11:09 AM  [place], [place], [address] Phone: [phone]   Fax:  [phone]  Name: [name] MRN: [id number] Date  of Birth: 1950/11/20  Manus Gunning, PT 12/05/18 11:09 AM

## 2018-12-06 ENCOUNTER — Encounter: Payer: Self-pay | Admitting: Rehabilitation

## 2018-12-06 ENCOUNTER — Ambulatory Visit: Payer: Medicare Other | Admitting: Rehabilitation

## 2018-12-06 DIAGNOSIS — Z483 Aftercare following surgery for neoplasm: Secondary | ICD-10-CM

## 2018-12-06 DIAGNOSIS — M25612 Stiffness of left shoulder, not elsewhere classified: Secondary | ICD-10-CM | POA: Diagnosis not present

## 2018-12-06 DIAGNOSIS — I972 Postmastectomy lymphedema syndrome: Secondary | ICD-10-CM

## 2018-12-06 DIAGNOSIS — M25611 Stiffness of right shoulder, not elsewhere classified: Secondary | ICD-10-CM

## 2018-12-06 DIAGNOSIS — M6281 Muscle weakness (generalized): Secondary | ICD-10-CM | POA: Diagnosis not present

## 2018-12-06 NOTE — Therapy (Signed)
[place], [place], [address] Phone: [phone]   Fax:  [phone]  Physical Therapy Treatment  Patient Details  Name: [name] MRN: [id number] Date of Birth: [birth year] Referring Provider (PT): Dr. Lindi Adie   Encounter Date: 12/06/2018  PT End of Session - 12/06/18 1608    Visit Number  13    Number of Visits  21    Date for PT Re-Evaluation  01/03/19    Authorization Type  Medicare    PT Start Time  1602    PT Stop Time  1647    PT Time Calculation (min)  45 min    Activity Tolerance  Patient tolerated treatment well    Behavior During Therapy  [facility] for tasks assessed/performed       Past Medical History:  Diagnosis Date  . Arthritis    "knees, lower back" (08/18/2017)  . 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 S/p lumpectomy and radiation.  Marland Kitchen CAD (coronary artery disease) 11/07   cath 11/11 :  3V CADsee report   . Cataract    small both eyes  . Chronic lower back pain   . Chronic pain    MR 08/07/10 :  Spondylosis L4-5 with B foraminal narrowing and L4 nerve root enchroachment B.  . Chronic venous insufficiency 2012   Has had full W/U incl ECHO, LFT's, Cr, Umicro, TSH, BNP. Symptomatic treatment.  . Colonic polyp 1995   Colonoscopies 1994, 2000, and 02/02/2011. Last had 9 polyps - Tubular adenoma and tubulovillous was the pathology results without high grade dysplasia  . Diabetes mellitus type II    Insulin dependent. Has worked with [name] R previously.  . Diastolic CHF, chronic ([place])    NL EF by echo 01/2012, Gr I dd  . Essential hypertension    Requires 5 drug therapy and still difficult to control.  Marland Kitchen GERD (gastroesophageal reflux disease)   . Heart murmur   . History of radiation therapy 10/02/18- 11/09/18   Left chest wall SCV, Left chest wall boost, Right chest wall SCV, right chest boost all received 28 fractions for a total  dose of 50.04 Gy  . Hyperlipidemia    On daily statin  . Iron deficiency anemia    Ferritin was 12 in 2012. on FeSo4. Has had colon and EGD 02/02/11 that showed mild gastritis and colon polyps.  . Obesity    Morbid. HAs worked with [name] T previously.  . OSA (obstructive sleep apnea) 02/16/2007   02/2007 : Moderate AHI 35.6, O2 sat decreased to 65%, CPAP titration to 16 with AHI of 3.6. 02/2014 : Rare respiratory events with sleep disturbance, within normal limits. AHI 0.4 per hour      . OSA on CPAP   . Personal history of chemotherapy   . Personal history of radiation therapy    "to my left breast"  . Refusal of blood transfusions as patient is Jehovah's Witness   . Seasonal allergies    "grass pollen"    Past Surgical History:  Procedure Laterality Date  . BREAST LUMPECTOMY Left 2006  . BREAST LUMPECTOMY W/ NEEDLE LOCALIZATION  2006   34 Chemo RX  . CARDIAC CATHETERIZATION N/A 08/15/2015   Procedure: Left Heart Cath and Coronary Angiography;  Surgeon: Troy Sine, MD;  Location: [facility];  Service: Cardiovascular;  Laterality: N/A;  . COLONOSCOPY W/ BIOPSIES AND POLYPECTOMY  1994, 2000,  2012, 26/018  . IR IMAGING GUIDED PORT INSERTION  04/17/2018  . IR REMOVAL TUN ACCESS W/ PORT W/O FL MOD SED  11/14/2018  . LEFT HEART CATH AND CORONARY ANGIOGRAPHY N/A 08/19/2017   Procedure: LEFT HEART CATH AND CORONARY ANGIOGRAPHY;  Surgeon: Belva Crome, MD;  Location: [facility];  Service: Cardiovascular;  Laterality: N/A;  . MASTECTOMY MODIFIED RADICAL Bilateral 07/20/2018  . MASTECTOMY MODIFIED RADICAL Bilateral 07/20/2018   Procedure: BILATERAL MODIFIED RADICAL MASTECTOMIES;  Surgeon: Jovita Kussmaul, MD;  Location: [facility];  Service: General;  Laterality: Bilateral;  . PORTACATH PLACEMENT N/A 04/14/2018   Procedure: ATTEMPED INSERTION PORT-A-CATH;  Surgeon: Jovita Kussmaul, MD;  [facility];  Service: General;  Laterality: N/A;  . TONSILLECTOMY AND ADENOIDECTOMY  1964  .  TOTAL ABDOMINAL HYSTERECTOMY  1985   "I had endometrosis"  . ULTRASOUND GUIDANCE FOR VASCULAR ACCESS  08/19/2017   Procedure: Ultrasound Guidance For Vascular Access;  Surgeon: Belva Crome, MD;  Location: [facility];  Service: Cardiovascular;;    There were no vitals filed for this visit.  Subjective Assessment - 12/06/18 1606    Subjective  My chest is just tight .  When I went to my PCP she said everything was working fine    Pertinent History   Pt had breast cancer diagnosed in May 2019 with 2 blisters popped up on left breast that got worse in June, She was diagnosed with left inflammatory breast cancer and had right axillay node involvment also.  Pt has bilateral mastectomy on October 17 with 1/2 nodes positive on left and 7/12/ nodes positive on right  She underwent chemotherapy from July - Sept and had some problems with fatigue and tingling in hands and fingertips She was not able to get the full does of chemo due to CHF.  The surgery was a success and she recently completed radiation 11/09/18.  Past history includes lumpectomy and radiation to left in 2006, CHF. lower back pain that causes her to walk with a cane  Past history also includes diabetes and high blood pressure, pt had drains removed from mastectomy on 09/05/18    Patient Stated Goals  to be able to get my limbs back in circulation and get rid of this fluid    Currently in Pain?  No/denies            LYMPHEDEMA/ONCOLOGY QUESTIONNAIRE - 12/06/18 1637      Right Upper Extremity Lymphedema   Other  [id number] above umbilicus                [facility] Adult PT Treatment/Exercise - 12/06/18 0001      Manual Therapy   Myofascial Release  STM to the bilateral chest around incisions with some stationary circles toward the axillae, gentle skin mobilization to the Lt chest with more radiation fibrosis    Manual Lymphatic Drainage (MLD)  diaphragmatic breaths, superficial and deep abdominals. right inguinal nodes.  anterior and lateral abdomen with direction to inguinal nodes and to back. same on right side     Passive ROM  to bilateral shoulders to tolerance               PT Short Term Goals - 08/02/18 2012      PT SHORT TERM GOAL #1   Title  Pt will report feelings of fullness in shoulders and trunk are decreased by at least 50% with manual lymph draiange and compression    Time  3    Period  Weeks    Status  New        PT Long Term Goals - 11/22/18 2041      PT LONG TERM GOAL #1   Title  Pt will be ind with final HEP for shoulder strength and ROM     Time  6    Period  Weeks    Status  New    Target Date  01/03/19      PT LONG TERM GOAL #2   Title  Pt will be ind with lymphedema self care for the UEs and abdomen including self MLD or pump use    Time  6    Period  Weeks    Status  New    Target Date  01/03/19      PT LONG TERM GOAL #3   Title  Pt will report no episodes of SOB and difficulty talking when the abdominal lymphedema exacerbates    Time  6    Status  New    Target Date  01/03/19      PT LONG TERM GOAL #4   Title  Pt will obtain appropriate compression garments for long term management     Time  6    Period  Weeks    Status  New    Target Date  01/03/19            Plan - 12/06/18 2017    Clinical Impression Statement  Pt continues with significant fluctuating abdominal swelling.  1cm less in circumference today after MLD.  Pt is still able to wear the abdominal binder but it starts to become irritating after too long.  Also work at bilateral chest with focus on skin mobility and incision mobility as well as shoulder pROM.  Increased radiation fibrosis Lt>Rt with less skin mobility.      PT Next Visit Plan  continue abdominal MLD, chest release and bil shoulder PROM, start to teach independence with self abdominal MLD/deep breathing, continue to measure abdominal circumference to see if PT is helping this    Recommended Other Services  flexitouch will  verify medical appropriateness and check with MD before issuing per conversation with Vicente Males today.     Consulted and Agree with Plan of Care  Patient       Patient will benefit from skilled therapeutic intervention in order to improve the following deficits and impairments:  Decreased skin integrity, Decreased knowledge of use of DME, Increased fascial restricitons, Pain, Postural dysfunction, Decreased scar mobility, Decreased range of motion, Decreased strength, Impaired UE functional use, Obesity, Increased edema, Decreased knowledge of precautions  Visit Diagnosis: Postmastectomy lymphedema  Stiffness of right shoulder, not elsewhere classified  Stiffness of left shoulder joint  Aftercare following surgery for neoplasm  Muscle weakness (generalized)     Problem List Patient Active Problem List   Diagnosis Date Noted  . Bilateral lower extremity edema 06/15/2018  . 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 Chronic diastolic heart failure ([place]) 06/15/2018  . Port-A-Cath in place 04/25/2018  . Hypertensive retinopathy 09/09/2017  . Elevated TSH 08/15/2015  . Prolonged Q-T interval on ECG 08/14/2015  . Diabetic retinopathy ([place]) 03/26/2015  . Abnormality of gait 01/06/2013  . CTS (carpal tunnel syndrome) 03/09/2012  . Urinary frequency 07/14/2011  . Chronic venous insufficiency   . Routine health maintenance 03/09/2011  . CAD (coronary artery disease)   . Benign essential HTN   . Hyperlipidemia   . Colonic polyp   .  Morbid obesity ([place])   . Chronic pain   . Malignant neoplasm of lower-inner quadrant of left breast in female, estrogen receptor positive ([place])   . Iron deficiency anemia   . Seasonal allergies   . OSA (obstructive sleep apnea) 02/16/2007  . DM type 2, uncontrolled, with retinopathy ([place]) 10/04/2004    Shan Levans, PT 12/06/2018, 8:23 PM  [place], [place],  [address] Phone: [phone]   Fax:  [phone]  Name: [name] MRN: [id number] Date of Birth: 1951/09/13

## 2018-12-08 ENCOUNTER — Encounter: Payer: Self-pay | Admitting: Radiation Oncology

## 2018-12-08 ENCOUNTER — Ambulatory Visit
Admission: RE | Admit: 2018-12-08 | Discharge: 2018-12-08 | Disposition: A | Discharge status: Home / Self Care | Payer: Medicare Other | Source: Ambulatory Visit | Attending: Radiation Oncology | Admitting: Radiation Oncology

## 2018-12-08 ENCOUNTER — Other Ambulatory Visit: Payer: Self-pay

## 2018-12-08 DIAGNOSIS — I890 Lymphedema, not elsewhere classified: Secondary | ICD-10-CM | POA: Diagnosis not present

## 2018-12-08 DIAGNOSIS — C50312 Malignant neoplasm of lower-inner quadrant of left female breast: Secondary | ICD-10-CM | POA: Insufficient documentation

## 2018-12-08 DIAGNOSIS — Z170 Estrogen receptor positive status [ER+]: Secondary | ICD-10-CM | POA: Diagnosis not present

## 2018-12-08 DIAGNOSIS — Z794 Long term (current) use of insulin: Secondary | ICD-10-CM | POA: Insufficient documentation

## 2018-12-08 DIAGNOSIS — Z7982 Long term (current) use of aspirin: Secondary | ICD-10-CM | POA: Diagnosis not present

## 2018-12-08 DIAGNOSIS — Z79899 Other long term (current) drug therapy: Secondary | ICD-10-CM | POA: Diagnosis not present

## 2018-12-08 DIAGNOSIS — C773 Secondary and unspecified malignant neoplasm of axilla and upper limb lymph nodes: Secondary | ICD-10-CM | POA: Diagnosis not present

## 2018-12-08 HISTORY — DX: Personal history of irradiation: Z92.3

## 2018-12-08 MED ORDER — RADIAPLEXRX EX GEL
Freq: Once | CUTANEOUS | Status: AC
  Administered 2018-12-08: 11:00:00 via TOPICAL

## 2018-12-08 NOTE — Progress Notes (Signed)
Radiation Oncology         [phone] ________________________________  Name: [name] MRN: [id number]  Date: 12/08/2018  DOB: August 29, 1951  Follow-Up Visit Note  Outpatient  CC: Bartholomew Crews, MD  Nicholas Lose, MD  Diagnosis and Prior Radiotherapy:    ICD-10-CM   1. 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 She is accompanied by a family member today. She is scheduled to follow up with Dr. Lindi Adie on 12/21/2018 and plans to begin anastrozole around that time.   She reports seeing physical therapy for lymphedema/fluid in her abdomen two days a week. She still has dry skin and old peeling, but it's improved since completing radiation. She continues to use radiaplex, hydrocortisone, and neosporin.   ALLERGIES:  is allergic to blood-group specific substance; lisinopril; oxycodone-acetaminophen; percocet [oxycodone-acetaminophen]; victoza [liraglutide]; and atorvastatin.  Meds: Current Outpatient Medications  Medication Sig Dispense Refill  . ACCU-CHEK FASTCLIX LANCETS MISC Check blood sugar 3 times a day (Patient taking differently: 1 each by Other route 3 (three) times daily. ) 102 each 5  . ACCU-CHEK GUIDE test strip USE 1  THREE TIMES DAILY (Patient taking differently: 1 each by Other route 3 (three) times daily. ) 375 each 1  . amLODipine (NORVASC) 10 MG tablet TAKE ONE (1) TABLET BY MOUTH EVERY DAY (Patient taking differently: Take 10 mg by mouth daily. ) 90 tablet 3  . aspirin EC 81 MG tablet Take 81 mg by mouth  daily.    . Coenzyme Q10 (CO Q 10) 100 MG CAPS Take 100 mg by mouth daily.     . diclofenac (FLECTOR) 1.3 % PTCH Place 1 patch onto the skin 2 (two) times daily. 30 patch 5  . furosemide (LASIX) 20 MG tablet TAKE 1 TABLET (20 MG TOTAL) BY MOUTH AS NEEDED FOR EDEMA. (Patient taking differently: Take 20 mg by mouth daily as needed for edema. ) 30 tablet 2  . gabapentin (NEURONTIN) 300 MG capsule Take 1 capsule (300 mg total) by mouth at bedtime. (Patient taking differently: Take 300 mg by mouth daily. ) 30 capsule 2  . insulin aspart (NOVOLOG FLEXPEN) 100 UNIT/ML FlexPen Take 6 units with breakfast and lunch. Take 5 units with dinner 15 mL 3  . Insulin Glargine (TOUJEO SOLOSTAR) 300 UNIT/ML SOPN Inject 30 Units into the skin daily. (Patient taking differently: Inject 30 Units into the skin every morning. ) 12 mL 3  . Insulin Pen Needle 32G X 4 MM MISC USE FOR INJECTIONS up to 4 times daily. Diagnosis code E11.40, Z79.4 200 each 5  . isosorbide mononitrate (IMDUR) 120 MG 24 hr tablet Take 120 mg by mouth 2 (two) times daily.    Marland Kitchen losartan (COZAAR) 50 MG tablet TAKE ONE (1) TABLET BY MOUTH EVERY DAY (Patient taking differently: Take 50 mg by mouth daily. ) 90 tablet 3  . meloxicam (MOBIC) 7.5 MG tablet TAKE 1 TABLET BY MOUTH ONCE DAILY AS NEEDED FOR PAIN 90 tablet 0  . metFORMIN (GLUCOPHAGE) 1000 MG tablet TAKE ONE (1) TABLET BY MOUTH TWO (2) TIMES  DAILY WITH A MEAL (Patient taking differently: Take 1,000 mg by mouth 2 (two) times daily with a meal. ) 180 tablet 3  . methocarbamol (ROBAXIN) 750 MG tablet Take 1 tablet (750 mg total) by mouth every 6 (six) hours as needed for muscle spasms. 30 tablet 1  . metoprolol succinate (TOPROL-XL) 100 MG 24 hr tablet TAKE ONE (1) TABLET BY MOUTH EVERY DAY (Patient taking differently: Take 100 mg by mouth daily. ) 90 tablet 3  . Olopatadine HCl (PATADAY) 0.2 % SOLN Place 1 drop into both eyes daily as needed (for allergies).     Marland Kitchen omeprazole (PRILOSEC) 40 MG capsule  Take 1 capsule (40 mg total) by mouth daily. (Patient taking differently: Take 40 mg by mouth daily as needed (for acid reflux). ) 30 capsule 5  . pyridoxine (B-6) 100 MG tablet Take 100 mg by mouth daily.    Marland Kitchen spironolactone (ALDACTONE) 25 MG tablet Take 1 tablet (25 mg total) by mouth daily. 90 tablet 3  . traMADol (ULTRAM) 50 MG tablet Take 1 tablet (50 mg total) by mouth every 12 (twelve) hours as needed. (Patient taking differently: Take 50 mg by mouth every 12 (twelve) hours as needed for moderate pain. ) 30 tablet 0  . vitamin B-12 (CYANOCOBALAMIN) 1000 MCG tablet Take 1,000 mcg by mouth daily.     No current facility-administered medications for this encounter.     Physical Findings: The patient is in no acute distress. Patient is alert and oriented.  weight is 205 lb 12.8 oz (93.4 kg). Her oral temperature is 97.8 F (36.6 C). Her blood pressure is 158/81 (abnormal) and her pulse is 69. Her respiration is 18 and oxygen saturation is 99%. .    General: Alert and oriented, in no acute distress  Skin: Hyper- and hypopigmentation changes over the entire chest, but overall skin is healing nicely.  Psychiatric: Judgment and insight are intact. Affect is appropriate.   Lab Findings: Lab Results  Component Value Date   WBC 3.7 (L) 11/14/2018   HGB 11.5 (L) 11/14/2018   HCT 36.3 11/14/2018   MCV 89.6 11/14/2018   PLT 144 (L) 11/14/2018    Radiographic Findings: Ir Removal Tun Access W/ Port W/o Fl  Result Date: 11/14/2018 INDICATION: 68 year old with breast cancer. Port-A-Cath is no longer needed. Port was placed on 04/17/2018 and has been working well without complication. EXAM: REMOVAL RIGHT IJ VEIN PORT-A-CATH MEDICATIONS: Ancef 2 g; The antibiotic was administered within an appropriate time interval prior to skin puncture. ANESTHESIA/SEDATION: Moderate (conscious) sedation was employed during this procedure. A total of Versed 1.0 mg and Fentanyl 25 mcg was administered  intravenously. Moderate Sedation Time: 26 minutes. The patient's level of consciousness and vital signs were monitored continuously by radiology nursing throughout the procedure under my direct supervision. FLUOROSCOPY TIME:  None COMPLICATIONS: None immediate. PROCEDURE: Informed written consent was obtained from the patient after a thorough discussion of the procedural risks, benefits and alternatives. All questions were addressed. Maximal Sterile Barrier Technique was utilized including caps, mask, sterile gowns, sterile gloves, sterile drape, hand hygiene and skin antiseptic. A timeout was performed prior to the initiation of the procedure. The right chest was prepped and draped in a sterile fashion. Lidocaine was utilized for local anesthesia. An incision was made over the previously healed surgical incision. Utilizing blunt dissection, the port catheter and reservoir were removed from the underlying subcutaneous tissue in their entirety. Securing sutures were also removed. The pocket was irrigated with a copious  amount of sterile normal saline. The subcutaneous tissue was closed with 3-0 Vicryl interrupted subcutaneous stitches. A 4-0 Vicryl running subcuticular stitch was utilized to approximate the skin. Dermabond was applied. IMPRESSION: Successful right IJ vein Port-A-Cath explant. Electronically Signed   By: Markus Daft M.D.   On: 11/14/2018 13:22    Impression/Plan: Bilateral Breast Cancer s/p mastectomy Healing well from radiotherapy to the bilateral chest wall.  Continue skin care with topical Radiaplex, Vitamin E Oil and / or lotion for at least 2 more months for further healing. Continue PT.   I encouraged her to followup with medical oncology. I will see her back on an as-needed basis. I have encouraged her to call if she has any issues or concerns in the future. I wished her the very best.  I spent 15 minutes face to face with the patient and more than 50% of that time was spent in  counseling and/or coordination of care. _____________________________________   Eppie Gibson, MD  This document serves as a record of services personally performed by Eppie Gibson, MD. It was created on her behalf by Wilburn Mylar, a trained medical scribe. The creation of this record is based on the scribe's personal observations and the provider's statements to them. This document has been checked and approved by the attending provider.

## 2018-12-13 ENCOUNTER — Ambulatory Visit: Payer: Medicare Other

## 2018-12-13 ENCOUNTER — Other Ambulatory Visit: Payer: Self-pay | Admitting: Cardiology

## 2018-12-13 ENCOUNTER — Other Ambulatory Visit: Payer: Self-pay | Admitting: Internal Medicine

## 2018-12-13 ENCOUNTER — Other Ambulatory Visit: Payer: Self-pay

## 2018-12-13 DIAGNOSIS — M6281 Muscle weakness (generalized): Secondary | ICD-10-CM | POA: Diagnosis not present

## 2018-12-13 DIAGNOSIS — M25612 Stiffness of left shoulder, not elsewhere classified: Secondary | ICD-10-CM

## 2018-12-13 DIAGNOSIS — M898X9 Other specified disorders of bone, unspecified site: Secondary | ICD-10-CM

## 2018-12-13 DIAGNOSIS — I972 Postmastectomy lymphedema syndrome: Secondary | ICD-10-CM | POA: Diagnosis not present

## 2018-12-13 DIAGNOSIS — M25611 Stiffness of right shoulder, not elsewhere classified: Secondary | ICD-10-CM

## 2018-12-13 DIAGNOSIS — Z483 Aftercare following surgery for neoplasm: Secondary | ICD-10-CM

## 2018-12-13 NOTE — Therapy (Signed)
[place], [place], [address] Phone: [phone]   Fax:  [phone]  Physical Therapy Treatment  Patient Details  Name: [name] MRN: [id number] Date of Birth: 01-01-1951 Referring Provider (PT): Dr. Lindi Adie   Encounter Date: 12/13/2018  PT End of Session - 12/13/18 1014    Visit Number  14    Number of Visits  21    Date for PT Re-Evaluation  01/03/19    Authorization Type  Medicare    PT Start Time  0926    PT Stop Time  1013    PT Time Calculation (min)  47 min    Activity Tolerance  Patient tolerated treatment well    Behavior During Therapy  [facility] [place] for tasks assessed/performed       Past Medical History:  Diagnosis Date  . Arthritis    "knees, lower back" (08/18/2017)  . 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 S/p lumpectomy and radiation.  Marland Kitchen CAD (coronary artery disease) 11/07   cath 11/11 :  3V CADsee report   . Cataract    small both eyes  . Chronic lower back pain   . Chronic pain    MR 08/07/10 :  Spondylosis L4-5 with B foraminal narrowing and L4 nerve root enchroachment B.  . Chronic venous insufficiency 2012   Has had full W/U incl ECHO, LFT's, Cr, Umicro, TSH, BNP. Symptomatic treatment.  . Colonic polyp 1995   Colonoscopies 1994, 2000, and 02/02/2011. Last had 9 polyps - Tubular adenoma and tubulovillous was the pathology results without high grade dysplasia  . Diabetes mellitus type II    Insulin dependent. Has worked with [name] R previously.  . Diastolic CHF, chronic ([place])    NL EF by echo 01/2012, Gr I dd  . Essential hypertension    Requires 5 drug therapy and still difficult to control.  Marland Kitchen GERD (gastroesophageal reflux disease)   . Heart murmur   . History of radiation therapy 10/02/18- 11/09/18   Left chest wall SCV, Left chest wall boost, Right chest wall SCV, right chest boost all received 28 fractions for a total  dose of 50.04 Gy  . Hyperlipidemia    On daily statin  . Iron deficiency anemia    Ferritin was 12 in 2012. on FeSo4. Has had colon and EGD 02/02/11 that showed mild gastritis and colon polyps.  . Obesity    Morbid. HAs worked with [name] T previously.  . OSA (obstructive sleep apnea) 02/16/2007   02/2007 : Moderate AHI 35.6, O2 sat decreased to 65%, CPAP titration to 16 with AHI of 3.6. 02/2014 : Rare respiratory events with sleep disturbance, within normal limits. AHI 0.4 per hour      . OSA on CPAP   . Personal history of chemotherapy   . Personal history of radiation therapy    "to my left breast"  . Refusal of blood transfusions as patient is Jehovah's Witness   . Seasonal allergies    "grass pollen"    Past Surgical History:  Procedure Laterality Date  . BREAST LUMPECTOMY Left 2006  . BREAST LUMPECTOMY W/ NEEDLE LOCALIZATION  2006   34 Chemo RX  . CARDIAC CATHETERIZATION N/A 08/15/2015   Procedure: Left Heart Cath and Coronary Angiography;  Surgeon: Troy Sine, MD;  Location: [facility];  Service: Cardiovascular;  Laterality: N/A;  . COLONOSCOPY W/ BIOPSIES AND POLYPECTOMY  1994, 2000,  2012, 26/018  . IR IMAGING GUIDED PORT INSERTION  04/17/2018  . IR REMOVAL TUN ACCESS W/ PORT W/O FL MOD SED  11/14/2018  . LEFT HEART CATH AND CORONARY ANGIOGRAPHY N/A 08/19/2017   Procedure: LEFT HEART CATH AND CORONARY ANGIOGRAPHY;  Surgeon: Belva Crome, MD;  Location: [place];  Service: Cardiovascular;  Laterality: N/A;  . MASTECTOMY MODIFIED RADICAL Bilateral 07/20/2018  . MASTECTOMY MODIFIED RADICAL Bilateral 07/20/2018   Procedure: BILATERAL MODIFIED RADICAL MASTECTOMIES;  Surgeon: Jovita Kussmaul, MD;  Location: [facility];  Service: General;  Laterality: Bilateral;  . PORTACATH PLACEMENT N/A 04/14/2018   Procedure: ATTEMPED INSERTION PORT-A-CATH;  Surgeon: Jovita Kussmaul, MD;  Location: [facility];  Service: General;  Laterality: N/A;  . TONSILLECTOMY AND ADENOIDECTOMY  1964  .  TOTAL ABDOMINAL HYSTERECTOMY  1985   "I had endometrosis"  . ULTRASOUND GUIDANCE FOR VASCULAR ACCESS  08/19/2017   Procedure: Ultrasound Guidance For Vascular Access;  Surgeon: Belva Crome, MD;  Location: [facility] [facility];  Service: Cardiovascular;;    There were no vitals filed for this visit.  Subjective Assessment - 12/13/18 0929    Subjective  I think the swelling is getting some better. And I'm stretching my shoulders all the time.     Pertinent History   Pt had breast cancer diagnosed in May 2019 with 2 blisters popped up on left breast that got worse in June, She was diagnosed with left inflammatory breast cancer and had right axillay node involvment also.  Pt has bilateral mastectomy on October 17 with 1/2 nodes positive on left and 7/12/ nodes positive on right  She underwent chemotherapy from July - Sept and had some problems with fatigue and tingling in hands and fingertips She was not able to get the full does of chemo due to CHF.  The surgery was a success and she recently completed radiation 11/09/18.  Past history includes lumpectomy and radiation to left in 2006, CHF. lower back pain that causes her to walk with a cane  Past history also includes diabetes and high blood pressure, pt had drains removed from mastectomy on 09/05/18    Patient Stated Goals  to be able to get my limbs back in circulation and get rid of this fluid    Currently in Pain?  No/denies                       [facility] Adult PT Treatment/Exercise - 12/13/18 0001      Manual Therapy   Myofascial Release  Cross hands technique horizontally and vertically and Rt and then Lt chest walls, then bil across chest; also to the bilateral chest incisions with some stationary circles, gentle skin mobilization to the Lt chest with more radiation fibrosis; towards end of session added towel at thoracic spine for increased pectoralis stretch    Manual Lymphatic Drainage (MLD)  short neck, diaphragmatic breaths,  superficial and deep abdominals. right inguinal nodes. anterior and lateral abdomen with direction to inguinal nodes, then same on right side     Passive ROM  to bilateral shoulders to tolerance into flexion, abduction and D2               PT Short Term Goals - 08/02/18 2012      PT SHORT TERM GOAL #1   Title  Pt will report feelings of fullness in shoulders and trunk are decreased by at least 50% with manual lymph draiange and compression  Time  3    Period  Weeks    Status  New        PT Long Term Goals - 11/22/18 2041      PT LONG TERM GOAL #1   Title  Pt will be ind with final HEP for shoulder strength and ROM     Time  6    Period  Weeks    Status  New    Target Date  01/03/19      PT LONG TERM GOAL #2   Title  Pt will be ind with lymphedema self care for the UEs and abdomen including self MLD or pump use    Time  6    Period  Weeks    Status  New    Target Date  01/03/19      PT LONG TERM GOAL #3   Title  Pt will report no episodes of SOB and difficulty talking when the abdominal lymphedema exacerbates    Time  6    Status  New    Target Date  01/03/19      PT LONG TERM GOAL #4   Title  Pt will obtain appropriate compression garments for long term management     Time  6    Period  Weeks    Status  New    Target Date  01/03/19            Plan - 12/13/18 1014    Clinical Impression Statement  Continued with focus on manual therapy to work on decreasing fluid at abdomen and increasing myofascial and UE mobility. Pt reports has been wearing her abdominal binder daily and reports she feels this helps temporarily while she wears it. She has been measured for compression garments and these should arrive in about 2 weeks.     Rehab Potential  Good    Clinical Impairments Affecting Rehab Potential  pt will be starting radiation    PT Frequency  2x / week    PT Duration  6 weeks    PT Treatment/Interventions  ADLs/Self Care Home Management;Therapeutic  activities;Patient/family education;Manual lymph drainage;DME Instruction;Neuromuscular re-education;Orthotic Fit/Training;Therapeutic exercise;Manual techniques;Passive range of motion;Taping;Compression bandaging;Scar mobilization    PT Next Visit Plan  continue abdominal MLD, chest release and bil shoulder PROM, start to teach independence with self abdominal MLD/deep breathing, continue to measure abdominal circumference to see if PT is helping this    Consulted and Agree with Plan of Care  Patient       Patient will benefit from skilled therapeutic intervention in order to improve the following deficits and impairments:  Decreased skin integrity, Decreased knowledge of use of DME, Increased fascial restricitons, Pain, Postural dysfunction, Decreased scar mobility, Decreased range of motion, Decreased strength, Impaired UE functional use, Obesity, Increased edema, Decreased knowledge of precautions  Visit Diagnosis: Postmastectomy lymphedema  Stiffness of right shoulder, not elsewhere classified  Stiffness of left shoulder joint  Aftercare following surgery for neoplasm  Muscle weakness (generalized)     Problem List Patient Active Problem List   Diagnosis Date Noted  . Bilateral lower extremity edema 06/15/2018  . 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 Chronic diastolic heart failure ([place]) 06/15/2018  . Port-A-Cath in place 04/25/2018  . Hypertensive retinopathy 09/09/2017  . Elevated TSH 08/15/2015  . Prolonged Q-T interval on ECG 08/14/2015  . Diabetic retinopathy ([place]) 03/26/2015  . Abnormality of gait 01/06/2013  . CTS (carpal tunnel syndrome) 03/09/2012  . Urinary frequency 07/14/2011  . Chronic  venous insufficiency   . Routine health maintenance 03/09/2011  . CAD (coronary artery disease)   . Benign essential HTN   . Hyperlipidemia   . Colonic polyp   . Morbid obesity ([place])   . Chronic pain   . Malignant neoplasm of lower-inner quadrant of left breast in  female, estrogen receptor positive ([place])   . Iron deficiency anemia   . Seasonal allergies   . OSA (obstructive sleep apnea) 02/16/2007  . DM type 2, uncontrolled, with retinopathy ([place]) 10/04/2004    Otelia Limes, PTA 12/13/2018, 10:18 AM  [place], [place], [address] Phone: [phone]   Fax:  [phone]  Name: [name] MRN: [id number] Date of Birth: 1951/02/15

## 2018-12-13 NOTE — Telephone Encounter (Signed)
Next appt scheduled 5/28 with PCP.

## 2018-12-13 NOTE — Telephone Encounter (Signed)
Called pt - informed of Tramadol refill denial. She stated Dr Marlou Starks had prescribed Tramadol after her surgery; Informed pt to call his office for refills; stated she will or try something else.

## 2018-12-13 NOTE — Telephone Encounter (Signed)
She was supposed to contact the presciber of tramadol and I do not see phone note that she did

## 2018-12-15 ENCOUNTER — Encounter: Payer: Medicare Other | Admitting: Rehabilitation

## 2018-12-19 ENCOUNTER — Other Ambulatory Visit: Payer: Self-pay

## 2018-12-19 ENCOUNTER — Ambulatory Visit: Payer: Medicare Other

## 2018-12-19 DIAGNOSIS — Z483 Aftercare following surgery for neoplasm: Secondary | ICD-10-CM

## 2018-12-19 DIAGNOSIS — M25611 Stiffness of right shoulder, not elsewhere classified: Secondary | ICD-10-CM | POA: Diagnosis not present

## 2018-12-19 DIAGNOSIS — M6281 Muscle weakness (generalized): Secondary | ICD-10-CM | POA: Diagnosis not present

## 2018-12-19 DIAGNOSIS — I972 Postmastectomy lymphedema syndrome: Secondary | ICD-10-CM

## 2018-12-19 DIAGNOSIS — M25612 Stiffness of left shoulder, not elsewhere classified: Secondary | ICD-10-CM | POA: Diagnosis not present

## 2018-12-19 NOTE — Therapy (Addendum)
[place], [place], [address] Phone: [phone]   Fax:  [phone]  Physical Therapy Treatment  Patient Details  Name: [name] MRN: [id number] Date of Birth: 1950/11/11 Referring Provider (PT): Dr. Lindi Adie   Encounter Date: 12/19/2018  PT End of Session - 12/19/18 1159    Visit Number  15    Number of Visits  21    Date for PT Re-Evaluation  01/03/19    PT Start Time  1101    PT Stop Time  1152    PT Time Calculation (min)  51 min    Activity Tolerance  Patient tolerated treatment well    Behavior During Therapy  [facility] for tasks assessed/performed       Past Medical History:  Diagnosis Date  . Arthritis    "knees, lower back" (08/18/2017)  . 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 S/p lumpectomy and radiation.  Marland Kitchen CAD (coronary artery disease) 11/07   cath 11/11 :  3V CADsee report   . Cataract    small both eyes  . Chronic lower back pain   . Chronic pain    MR 08/07/10 :  Spondylosis L4-5 with B foraminal narrowing and L4 nerve root enchroachment B.  . Chronic venous insufficiency 2012   Has had full W/U incl ECHO, LFT's, Cr, Umicro, TSH, BNP. Symptomatic treatment.  . Colonic polyp 1995   Colonoscopies 1994, 2000, and 02/02/2011. Last had 9 polyps - Tubular adenoma and tubulovillous was the pathology results without high grade dysplasia  . Diabetes mellitus type II    Insulin dependent. Has worked with [name] R previously.  . Diastolic CHF, chronic ([place])    NL EF by echo 01/2012, Gr I dd  . Essential hypertension    Requires 5 drug therapy and still difficult to control.  Marland Kitchen GERD (gastroesophageal reflux disease)   . Heart murmur   . History of radiation therapy 10/02/18- 11/09/18   Left chest wall SCV, Left chest wall boost, Right chest wall SCV, right chest boost all received 28 fractions for a total dose of 50.04 Gy  .  Hyperlipidemia    On daily statin  . Iron deficiency anemia    Ferritin was 12 in 2012. on FeSo4. Has had colon and EGD 02/02/11 that showed mild gastritis and colon polyps.  . Obesity    Morbid. HAs worked with [name] T previously.  . OSA (obstructive sleep apnea) 02/16/2007   02/2007 : Moderate AHI 35.6, O2 sat decreased to 65%, CPAP titration to 16 with AHI of 3.6. 02/2014 : Rare respiratory events with sleep disturbance, within normal limits. AHI 0.4 per hour      . OSA on CPAP   . Personal history of chemotherapy   . Personal history of radiation therapy    "to my left breast"  . Refusal of blood transfusions as patient is Jehovah's Witness   . Seasonal allergies    "grass pollen"    Past Surgical History:  Procedure Laterality Date  . BREAST LUMPECTOMY Left 2006  . BREAST LUMPECTOMY W/ NEEDLE LOCALIZATION  2006   34 Chemo RX  . CARDIAC CATHETERIZATION N/A 08/15/2015   Procedure: Left Heart Cath and Coronary Angiography;  Surgeon: Troy Sine, MD;  Location: [place] [facility];  Service: Cardiovascular;  Laterality: N/A;  . COLONOSCOPY W/ BIOPSIES AND POLYPECTOMY  1994, 2000, 2012, 26/018  . IR IMAGING GUIDED  PORT INSERTION  04/17/2018  . IR REMOVAL TUN ACCESS W/ PORT W/O FL MOD SED  11/14/2018  . LEFT HEART CATH AND CORONARY ANGIOGRAPHY N/A 08/19/2017   Procedure: LEFT HEART CATH AND CORONARY ANGIOGRAPHY;  Surgeon: Belva Crome, MD;  Location: [place] [facility];  Service: Cardiovascular;  Laterality: N/A;  . MASTECTOMY MODIFIED RADICAL Bilateral 07/20/2018  . MASTECTOMY MODIFIED RADICAL Bilateral 07/20/2018   Procedure: BILATERAL MODIFIED RADICAL MASTECTOMIES;  Surgeon: Jovita Kussmaul, MD;  Location: [place];  Service: General;  Laterality: Bilateral;  . PORTACATH PLACEMENT N/A 04/14/2018   Procedure: ATTEMPED INSERTION PORT-A-CATH;  Surgeon: Jovita Kussmaul, MD;  Location: [facility];  Service: General;  Laterality: N/A;  . TONSILLECTOMY AND ADENOIDECTOMY  1964  . TOTAL ABDOMINAL  HYSTERECTOMY  1985   "I had endometrosis"  . ULTRASOUND GUIDANCE FOR VASCULAR ACCESS  08/19/2017   Procedure: Ultrasound Guidance For Vascular Access;  Surgeon: Belva Crome, MD;  Location: [place] [facility];  Service: Cardiovascular;;    There were no vitals filed for this visit.  Subjective Assessment - 12/19/18 1058    Subjective  I got my compression sleeves Saturday and I've worn them every day since and my arms feel so good now. The sleeves fit well and are comfortable.     Pertinent History   Pt had breast cancer diagnosed in May 2019 with 2 blisters popped up on left breast that got worse in June, She was diagnosed with left inflammatory breast cancer and had right axillay node involvment also.  Pt has bilateral mastectomy on October 17 with 1/2 nodes positive on left and 7/12/ nodes positive on right  She underwent chemotherapy from July - Sept and had some problems with fatigue and tingling in hands and fingertips She was not able to get the full does of chemo due to CHF.  The surgery was a success and she recently completed radiation 11/09/18.  Past history includes lumpectomy and radiation to left in 2006, CHF. lower back pain that causes her to walk with a cane  Past history also includes diabetes and high blood pressure, pt had drains removed from mastectomy on 09/05/18    Patient Stated Goals  to be able to get my limbs back in circulation and get rid of this fluid    Currently in Pain?  No/denies         [facility] PT Assessment - 12/19/18 0001      AROM   Right Shoulder Flexion  124 Degrees    Right Shoulder ABduction  111 Degrees    Left Shoulder Flexion  127 Degrees    Left Shoulder ABduction  117 Degrees        LYMPHEDEMA/ONCOLOGY QUESTIONNAIRE - 12/19/18 1149      Right Upper Extremity Lymphedema   Other  113.6 10 cm above umbilicus                [facility] Adult PT Treatment/Exercise - 12/19/18 0001      Shoulder Exercises: Pulleys   Flexion  1 minute     ABduction  2 minutes    ABduction Limitations  VCs for correct technique      Manual Therapy   Myofascial Release  Cross hands technique horizontally and vertically and Rt and then Lt chest walls, then bil across chest; also to the bilateral chest incisions with some stationary circles, gentle skin mobilization to the Lt chest with more radiation fibrosis; towards end of session added towel at thoracic  spine for increased pectoralis stretch    Manual Lymphatic Drainage (MLD)  short neck, diaphragmatic breaths, superficial and deep abdominals. right inguinal nodes. anterior and lateral abdomen with direction to inguinal nodes, then same on right side     Passive ROM  to bilateral shoulders to tolerance into flexion, abduction and D2               PT Short Term Goals - 08/02/18 2012      PT SHORT TERM GOAL #1   Title  Pt will report feelings of fullness in shoulders and trunk are decreased by at least 50% with manual lymph draiange and compression    Time  3    Period  Weeks    Status  New        PT Long Term Goals - 11/22/18 2041      PT LONG TERM GOAL #1   Title  Pt will be ind with final HEP for shoulder strength and ROM     Time  6    Period  Weeks    Status  New    Target Date  01/03/19      PT LONG TERM GOAL #2   Title  Pt will be ind with lymphedema self care for the UEs and abdomen including self MLD or pump use    Time  6    Period  Weeks    Status  New    Target Date  01/03/19      PT LONG TERM GOAL #3   Title  Pt will report no episodes of SOB and difficulty talking when the abdominal lymphedema exacerbates    Time  6    Status  New    Target Date  01/03/19      PT LONG TERM GOAL #4   Title  Pt will obtain appropriate compression garments for long term management     Time  6    Period  Weeks    Status  New    Target Date  01/03/19            Plan - 12/19/18 1202    Clinical Impression Statement  Spent alot of time on decongesting [name]  abdomen during manual lymph drainage today. Her A/ROM of bil shoulders has improved well since last time measured and her abdominal circumference is also reduced since last measured. Pt is making good gains this far. She also received her compression sleeves in the mail Saturday and has been wearing htem daily since. They appeared to be a good fit but were slipping into her antecubital fossa of both arms so instructed her to be aware of this and adjust sleeves accordingly during the day.     Rehab Potential  Good    Clinical Impairments Affecting Rehab Potential  pt will be starting radiation    PT Frequency  2x / week    PT Duration  6 weeks    PT Treatment/Interventions  ADLs/Self Care Home Management;Therapeutic activities;Patient/family education;Manual lymph drainage;DME Instruction;Neuromuscular re-education;Orthotic Fit/Training;Therapeutic exercise;Manual techniques;Passive range of motion;Taping;Compression bandaging;Scar mobilization    PT Next Visit Plan  continue abdominal MLD, chest release and bil shoulder PROM, review self abdominal MLD/deep breathing and have pt return demo to assess technique, continue to measure abdominal circumference to see if PT is helping this    Consulted and Agree with Plan of Care  Patient       Patient will benefit from skilled therapeutic intervention in order to  improve the following deficits and impairments:  Decreased skin integrity, Decreased knowledge of use of DME, Increased fascial restricitons, Pain, Postural dysfunction, Decreased scar mobility, Decreased range of motion, Decreased strength, Impaired UE functional use, Obesity, Increased edema, Decreased knowledge of precautions  Visit Diagnosis: Postmastectomy lymphedema  Stiffness of right shoulder, not elsewhere classified  Stiffness of left shoulder joint  Aftercare following surgery for neoplasm  Muscle weakness (generalized)     Problem List Patient Active Problem List    Diagnosis Date Noted  . Bilateral lower extremity edema 06/15/2018  . 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 Chronic diastolic heart failure ([place]) 06/15/2018  . Port-A-Cath in place 04/25/2018  . Hypertensive retinopathy 09/09/2017  . Elevated TSH 08/15/2015  . Prolonged Q-T interval on ECG 08/14/2015  . Diabetic retinopathy ([place]) 03/26/2015  . Abnormality of gait 01/06/2013  . CTS (carpal tunnel syndrome) 03/09/2012  . Urinary frequency 07/14/2011  . Chronic venous insufficiency   . Routine health maintenance 03/09/2011  . CAD (coronary artery disease)   . Benign essential HTN   . Hyperlipidemia   . Colonic polyp   . Morbid obesity ([place])   . Chronic pain   . Malignant neoplasm of lower-inner quadrant of left breast in female, estrogen receptor positive ([place])   . Iron deficiency anemia   . Seasonal allergies   . OSA (obstructive sleep apnea) 02/16/2007  . DM type 2, uncontrolled, with retinopathy ([place]) 10/04/2004    Otelia Limes, PTA 12/19/2018, 12:06 PM  [place], [place], [address] Phone: [phone]   Fax:  [phone]  Name: [name] MRN: [id number] Date of Birth: 18-Jun-1951  PHYSICAL THERAPY DISCHARGE SUMMARY  Visits from Start of Care: 15  Current functional level related to goals / functional outcomes: See above.  Pt stopped visits due to COVID-19 and has not returned.  Chart review shows recent MI and stent placement.     Remaining deficits: Chronic lymphedema, scar tissue, and decreased shoulder mobility    Education / Equipment: HEP, bil sleeves Plan: Patient agrees to discharge.  Patient goals were partially met. Patient is being discharged due to not returning since the last visit.  ?????   Shan Levans, PT

## 2018-12-20 NOTE — Progress Notes (Signed)
Patient Care Team: Bartholomew Crews, MD as PCP - General (Internal Medicine) Hayden Pedro, MD as Consulting Physician (Ophthalmology) Shirley Muscat Loreen Freud, MD as Referring Physician (Optometry) Minus Breeding, MD as Consulting Physician (Cardiology)  DIAGNOSIS:    ICD-10-CM   1. 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 S/p lumpectomy and radiation.    03/21/2018 Initial Diagnosis    Peau [place] Orange skin anterior left breast: Ultrasound 7 o'clock position 2 cm from nipple irregular hypoechoic mass 2.2 x 2.5 x 2.5 cm (1 cm medial to prior lumpectomy); 12:00: Hypoechoic mass extending to skin 3.9 x 2.9 x 3.6 cm market skin thickening, no axillary lymph nodes    03/21/2018 Initial Biopsy    Left breast 12 o'clock position: IDC grade 3; 7 o'clock position: IDC grade 3, ER 40%, PR 0%, Ki-67 80%, HER-2 equivocal by FISH ratio 1.17 copy #4.4, IHC 1+ negative, T4d N0 stage IIIc clinical stage     04/20/2018 Imaging    IMPRESSION: 1. Multiple masses within the left breast (inflammatory breast carcinoma.) 2. Cortically thickened right axillary lymph node, indeterminate. Metastatic disease not excluded (patient refused biopsy)    05/09/2018 - 06/20/2018 Neo-Adjuvant Chemotherapy    Neoadjuvant chemotherapy with TC X 4    07/20/2018 Surgery    Bilateral mastectomies: Left mastectomy: Grade 3 IDC 6.5 cm, 1/2 lymph nodes positive, ER 40%, PR 0%, HER-2 negative, Ki-67 80%; right axillary lymph node 7/12 LN pos ER 0%, PR 0%, HER-2 negative, Ki-67 90% RCB class III; right mastectomy: Benign    08/02/2018 Cancer Staging    Staging form: Breast, AJCC 8th Edition - Clinical: Stage IIIC (cT4d, cN0,  cM0, G3, ER+, PR-, HER2-) - Signed by Gardenia Phlegm, NP on 08/02/2018    08/02/2018 Cancer Staging    Staging form: Breast, AJCC 8th Edition - Pathologic: No Stage Recommended (ypT4d, pN1a, cM0, G3, ER-, PR-, HER2-) - Signed by Eppie Gibson, MD on 09/15/2018    10/02/2018 - 11/09/2018 Radiation Therapy    Adjuvant XRT    12/21/2018 -  Anti-estrogen oral therapy    Anastrozole 61m daily, plan for 7 years     CHIEF COMPLIANT: Follow-up to discuss antiestrogen therapy  INTERVAL HISTORY: Holly MESSINGis a 68y.o. with above-mentioned history of left breast cancer in 2006 with recurrence in the left breast and axilla who underwent neoadjuvant chemotherapy with Taxotere and Cytoxan followed by bilateral mastectomies and adjuvant radiation. She presents to the clinic today with a family member and notes her energy and appetite have improved significantly and she is exercising regularly. She currently has hot flashes and muscle stiffness in her legs in addition to hardening in her chest at her mastectomy scars. She takes calcium and Vitamin D supplements. She reviewed her medication list with me.   REVIEW OF SYSTEMS:   Constitutional: Denies fevers, chills or abnormal weight loss (+) hot flashes Eyes: Denies blurriness of vision Ears, nose, mouth, throat, and face: Denies mucositis or sore throat Respiratory: Denies cough, dyspnea or wheezes Cardiovascular: Denies palpitation, chest discomfort Gastrointestinal: Denies nausea, heartburn or change in bowel habits Skin: Denies abnormal skin rashes MSK: (+) muscle stiffness in legs Lymphatics: Denies new  lymphadenopathy or easy bruising Neurological: Denies numbness, tingling or new weaknesses Behavioral/Psych: Mood is stable, no new changes  Extremities: No lower extremity edema Breast: denies any pain or lumps or nodules in either breasts (+) hardening of skin at mastectomy scars All other systems were reviewed with the patient and  are negative.  I have reviewed the past medical history, past surgical history, social history and family history with the patient and they are unchanged from previous note.  ALLERGIES:  is allergic to blood-group specific substance; lisinopril; oxycodone-acetaminophen; percocet [oxycodone-acetaminophen]; victoza [liraglutide]; and atorvastatin.  MEDICATIONS:  Current Outpatient Medications  Medication Sig Dispense Refill  . ACCU-CHEK FASTCLIX LANCETS MISC Check blood sugar 3 times a day (Patient taking differently: 1 each by Other route 3 (three) times daily. ) 102 each 5  . ACCU-CHEK GUIDE test strip USE 1  THREE TIMES DAILY (Patient taking differently: 1 each by Other route 3 (three) times daily. ) 375 each 1  . amLODipine (NORVASC) 10 MG tablet TAKE ONE (1) TABLET BY MOUTH EVERY DAY 90 tablet 3  . anastrozole (ARIMIDEX) 1 MG tablet Take 1 tablet (1 mg total) by mouth daily. 90 tablet 3  . aspirin EC 81 MG tablet Take 81 mg by mouth daily.    . Coenzyme Q10 (CO Q 10) 100 MG CAPS Take 100 mg by mouth daily.     . diclofenac (FLECTOR) 1.3 % PTCH Place 1 patch onto the skin 2 (two) times daily. 30 patch 5  . furosemide (LASIX) 20 MG tablet TAKE 1 TABLET (20 MG TOTAL) BY MOUTH AS NEEDED FOR EDEMA. (Patient taking differently: Take 20 mg by mouth daily as needed for edema. ) 30 tablet 2  . gabapentin (NEURONTIN) 300 MG capsule Take 1 capsule (300 mg total) by mouth at bedtime. (Patient taking differently: Take 300 mg by mouth daily. ) 30 capsule 2  . insulin aspart (NOVOLOG FLEXPEN) 100 UNIT/ML FlexPen Take 6 units with breakfast and lunch. Take 5 units with dinner 15 mL 3  . Insulin Glargine (TOUJEO SOLOSTAR) 300 UNIT/ML SOPN Inject 30 Units into the skin daily. (Patient taking differently: Inject 30 Units into the skin every morning. ) 12 mL 3  . Insulin Pen Needle 32G X 4 MM MISC USE FOR INJECTIONS up to 4 times daily. Diagnosis code E11.40, Z79.4 200 each 5  . isosorbide mononitrate (IMDUR)  120 MG 24 hr tablet Take 120 mg by mouth 2 (two) times daily.    Marland Kitchen losartan (COZAAR) 50 MG tablet TAKE ONE (1) TABLET BY MOUTH EVERY DAY (Patient taking differently: Take 50 mg by mouth daily. ) 90 tablet 3  . meloxicam (MOBIC) 7.5 MG tablet TAKE 1 TABLET BY MOUTH ONCE DAILY AS NEEDED FOR PAIN 90 tablet 0  . metFORMIN (GLUCOPHAGE) 1000 MG tablet Take 1 tablet (1,000 mg total) by mouth 2 (two) times daily with a meal. 180 tablet 3  . methocarbamol (ROBAXIN) 750 MG tablet Take 1 tablet (750 mg total) by mouth every 6 (six) hours as needed for muscle spasms. 30 tablet 1  . metoprolol succinate (TOPROL-XL) 100 MG 24 hr tablet TAKE ONE (1) TABLET BY MOUTH EVERY DAY (Patient taking differently: Take 100 mg by mouth daily. ) 90 tablet 3  . Olopatadine HCl (PATADAY) 0.2 % SOLN Place 1 drop into both eyes daily as needed (for allergies).     Marland Kitchen omeprazole (PRILOSEC) 40 MG capsule Take 1 capsule (40 mg total) by mouth daily. (Patient taking differently: Take 40 mg  by mouth daily as needed (for acid reflux). ) 30 capsule 5  . pyridoxine (B-6) 100 MG tablet Take 100 mg by mouth daily.    Marland Kitchen spironolactone (ALDACTONE) 25 MG tablet Take 1 tablet (25 mg total) by mouth daily. 90 tablet 3  . traMADol (ULTRAM) 50 MG tablet Take 1 tablet (50 mg total) by mouth every 12 (twelve) hours as needed. (Patient taking differently: Take 50 mg by mouth every 12 (twelve) hours as needed for moderate pain. ) 30 tablet 0  . vitamin B-12 (CYANOCOBALAMIN) 1000 MCG tablet Take 1,000 mcg by mouth daily.     No current facility-administered medications for this visit.     PHYSICAL EXAMINATION: ECOG PERFORMANCE STATUS: 1 - Symptomatic but completely ambulatory  Vitals:   12/21/18 1108  BP: (!) 176/95  Pulse: 69  Resp: 18  Temp: 98.5 F (36.9 C)  SpO2: 100%   Filed Weights   12/21/18 1108  Weight: 207 lb 3.2 oz (94 kg)    GENERAL: alert, no distress and comfortable SKIN: skin color, texture, turgor are normal, no rashes  or significant lesions EYES: normal, Conjunctiva are pink and non-injected, sclera clear OROPHARYNX: no exudate, no erythema and lips, buccal mucosa, and tongue normal  NECK: supple, thyroid normal size, non-tender, without nodularity LYMPH: no palpable lymphadenopathy in the cervical, axillary or inguinal LUNGS: clear to auscultation and percussion with normal breathing effort HEART: regular rate & rhythm and no murmurs and no lower extremity edema ABDOMEN: abdomen soft, non-tender and normal bowel sounds MUSCULOSKELETAL: no cyanosis of digits and no clubbing  NEURO: alert & oriented x 3 with fluent speech, no focal motor/sensory deficits EXTREMITIES: No lower extremity edema  LABORATORY DATA:  I have reviewed the data as listed CMP Latest Ref Rng & Units 10/19/2018 09/07/2018 07/20/2018  Glucose 70 - 99 mg/dL 124(H) 138(H) -  BUN 8 - 23 mg/dL 21 15 -  Creatinine 0.44 - 1.00 mg/dL 0.95 0.85 -  Sodium 135 - 145 mmol/L 142 142 139  Potassium 3.5 - 5.1 mmol/L 4.4 3.9 4.5  Chloride 98 - 111 mmol/L 110 107 -  CO2 22 - 32 mmol/L 25 25 -  Calcium 8.9 - 10.3 mg/dL 9.7 9.8 -  Total Protein 6.5 - 8.1 g/dL 7.4 7.5 -  Total Bilirubin 0.3 - 1.2 mg/dL 0.3 <0.2(L) -  Alkaline Phos 38 - 126 U/L 59 74 -  AST 15 - 41 U/L 13(L) 14(L) -  ALT 0 - 44 U/L 12 13 -    Lab Results  Component Value Date   WBC 3.7 (L) 11/14/2018   HGB 11.5 (L) 11/14/2018   HCT 36.3 11/14/2018   MCV 89.6 11/14/2018   PLT 144 (L) 11/14/2018   NEUTROABS 2.2 10/19/2018    ASSESSMENT & PLAN:  Malignant neoplasm of lower-inner quadrant of left breast in female, estrogen receptor positive [facility]) 2006 June : 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 S/p lumpectomy and radiation.  03/21/2018:[place]au [place] skin anterior left breast: Ultrasound 7 o'clock position 2 cm from nipple irregular hypoechoic mass 2.2 x 2.5 x 2.5 cm (1 cm medial to prior lumpectomy); 12:00: Hypoechoic mass extending to skin 3.9 x  2.9 x 3.6 cm market skin thickening, no axillary lymph nodes  Left breast 12 o'clock position: IDC grade 3; 7 o'clock position: IDC grade 3, ER 40%, PR 0%, Ki-67 80%, HER-2 equivocal by FISH ratio 1.17 copy #4.4, IHC 1+ negative, T4d N0 stage IIIc clinical stage  Right  breast and axillary ultrasound: Prominent right axillary lymph node.Biopsy revealed metastatic poorly differentiated carcinoma ER 0%, PR 0%, Ki-67 90%  CT CAP 04/20/18:Breast masses and axillary lymph node, no metastatic disease elsewhere ----------------------------------------------------------------------------- Treatment summary: 1. Neoadjuvant chemotherapy withTaxotere Cytoxan x4cycles completed 06/20/2018(due to cardiac issues she was not a candidate for Adriamycin) 2.  Bilateral mastectomies 07/20/2018 3. Followed by Xeloda-radiation completed 11/09/2018 4. Followed by antiestrogen therapy anastrozole 1 mg daily started 12/21/2018 ---------------------------------------------------------------------------- 07/20/2018:Bilateral mastectomies:  Left mastectomy: Grade 3 IDC 6.5 cm,1/2lymph nodes positive, ER 40%, PR 0%, HER-2 negative, Ki-67 80%;  right axillary lymph node7/12 PositiveER 0%, PR 0%, HER-2 negative, Ki-67 90% RCB class III;  Right mastectomy: Benign ---------------------------------------------------- Current treatment: Anastrozole 1 mg daily started 12/21/2018  Anastrozole counseling: We discussed the risks and benefits of anti-estrogen therapy with aromatase inhibitors. These include but not limited to insomnia, hot flashes, mood changes, vaginal dryness, bone density loss, and weight gain. We strongly believe that the benefits far outweigh the risks. Patient understands these risks and consented to starting treatment. Planned treatment duration is 5-7 years.  Return to clinic in 3 months for survivorship care plan visit     No orders of the defined types were placed in this encounter.   The patient has a good understanding of the overall plan. she agrees with it. she will call with any problems that may develop before the next visit here.  Nicholas Lose, MD 12/21/2018  Julious Oka Dorshimer am acting as scribe for Dr. Nicholas Lose.  I have reviewed the above documentation for accuracy and completeness, and I agree with the above.

## 2018-12-21 ENCOUNTER — Inpatient Hospital Stay: Payer: Medicare Other | Attending: Hematology and Oncology | Admitting: Hematology and Oncology

## 2018-12-21 ENCOUNTER — Telehealth: Payer: Self-pay | Admitting: Adult Health

## 2018-12-21 ENCOUNTER — Other Ambulatory Visit: Payer: Self-pay

## 2018-12-21 DIAGNOSIS — C50812 Malignant neoplasm of overlapping sites of left female breast: Secondary | ICD-10-CM | POA: Diagnosis not present

## 2018-12-21 DIAGNOSIS — Z170 Estrogen receptor positive status [ER+]: Secondary | ICD-10-CM | POA: Diagnosis not present

## 2018-12-21 DIAGNOSIS — C50312 Malignant neoplasm of lower-inner quadrant of left female breast: Secondary | ICD-10-CM

## 2018-12-21 DIAGNOSIS — Z79811 Long term (current) use of aromatase inhibitors: Secondary | ICD-10-CM | POA: Insufficient documentation

## 2018-12-21 DIAGNOSIS — C773 Secondary and unspecified malignant neoplasm of axilla and upper limb lymph nodes: Secondary | ICD-10-CM | POA: Insufficient documentation

## 2018-12-21 MED ORDER — ANASTROZOLE 1 MG PO TABS: 1.0000 mg | ORAL_TABLET | Freq: Every day | ORAL | 3 refills | Status: DC

## 2018-12-21 NOTE — Telephone Encounter (Signed)
Gave avs and calendar ° °

## 2018-12-21 NOTE — Assessment & Plan Note (Signed)
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 S/p lumpectomy and radiation.  03/21/2018:[place] skin anterior left breast: Ultrasound 7 o'clock position 2 cm from nipple irregular hypoechoic mass 2.2 x 2.5 x 2.5 cm (1 cm medial to prior lumpectomy); 12:00: Hypoechoic mass extending to skin 3.9 x 2.9 x 3.6 cm market skin thickening, no axillary lymph nodes  Left breast 12 o'clock position: IDC grade 3; 7 o'clock position: IDC grade 3, ER 40%, PR 0%, Ki-67 80%, HER-2 equivocal by FISH ratio 1.17 copy #4.4, IHC 1+ negative, T4d N0 stage IIIc clinical stage  Right breast and axillary ultrasound: Prominent right axillary lymph node.Biopsy revealed metastatic poorly differentiated carcinoma ER 0%, PR 0%, Ki-67 90%  CT CAP 04/20/18:Breast masses and axillary lymph node, no metastatic disease elsewhere ----------------------------------------------------------------------------- Treatment summary: 1. Neoadjuvant chemotherapy withTaxotere Cytoxan x4cycles completed 06/20/2018(due to cardiac issues she was not a candidate for Adriamycin) 2.  Bilateral mastectomies 07/20/2018 3. Followed by Xeloda-radiation completed 11/09/2018 4. Followed by antiestrogen therapy anastrozole 1 mg daily started 12/21/2018 ---------------------------------------------------------------------------- 07/20/2018:Bilateral mastectomies:  Left mastectomy: Grade 3 IDC 6.5 cm,1/2lymph nodes positive, ER 40%, PR 0%, HER-2 negative, Ki-67 80%;  right axillary lymph node7/12 PositiveER 0%, PR 0%, HER-2 negative, Ki-67 90% RCB class III;  Right mastectomy: Benign ---------------------------------------------------- Current treatment: Anastrozole 1 mg daily started 12/21/2018  Anastrozole counseling: We discussed the risks and benefits of anti-estrogen therapy with aromatase inhibitors. These include but not limited to insomnia, hot flashes, mood changes, vaginal dryness, bone density  loss, and weight gain. We strongly believe that the benefits far outweigh the risks. Patient understands these risks and consented to starting treatment. Planned treatment duration is 5-7 years.  Return to clinic in 3 months for survivorship care plan visit

## 2018-12-22 ENCOUNTER — Encounter: Payer: Medicare Other | Admitting: Physical Therapy

## 2018-12-22 ENCOUNTER — Encounter: Payer: Self-pay | Admitting: *Deleted

## 2019-01-03 ENCOUNTER — Encounter: Payer: Medicare Other | Admitting: Physical Therapy

## 2019-01-04 ENCOUNTER — Telehealth: Payer: Self-pay | Admitting: Physical Therapy

## 2019-01-04 NOTE — Telephone Encounter (Signed)
Spoke with patient about possibility of doing e visits. Pt reports that she is doing all of her exercises and at this time is not interested in evisits. She wants to be reschedule at [facility] when we reopen. Allyson Sabal [place], [place] 01/04/19 1:06 PM

## 2019-01-09 ENCOUNTER — Telehealth: Payer: Self-pay | Admitting: Dietician

## 2019-01-09 ENCOUNTER — Ambulatory Visit: Payer: Medicare Other | Admitting: Physical Therapy

## 2019-01-09 NOTE — Telephone Encounter (Signed)
Called Ms. Khamis to follow up on diabetes self care/assess needs. She says since her mealtime insulin she has had better blood sugars. "So far its doing real good, the only time it goes higher than 100s is after she drinks a smoothie with fruits for breakfast  Readings are  [phone] Lowest has 100-219 after a smoothie Exercising walking and doing leg lifts.  No needs identified at this time. She agreed to have a low carb smoothie recipe mailed to her an dif that doesn't;t work she may try decreasing her portion to 4 oz instead of 8 oz.  [name], RD 01/09/2019 2:39 PM.

## 2019-01-11 ENCOUNTER — Ambulatory Visit: Payer: Medicare Other

## 2019-01-12 ENCOUNTER — Encounter: Payer: Self-pay | Admitting: General Practice

## 2019-01-12 NOTE — Progress Notes (Signed)
[facility] Support Services   [facility] Support Services Team contacted patient to assess for food insecurity and other psychosocial needs during current COVID19 pandemic.  Unable to reach, left VM w my contact information.    Anne C Cunningham, LCSW  [facility]        

## 2019-01-17 ENCOUNTER — Other Ambulatory Visit: Payer: Self-pay

## 2019-01-17 ENCOUNTER — Other Ambulatory Visit: Payer: Self-pay | Admitting: Cardiology

## 2019-01-17 ENCOUNTER — Other Ambulatory Visit: Payer: Self-pay | Admitting: Internal Medicine

## 2019-01-17 DIAGNOSIS — R600 Localized edema: Secondary | ICD-10-CM

## 2019-01-17 DIAGNOSIS — E1140 Type 2 diabetes mellitus with diabetic neuropathy, unspecified: Secondary | ICD-10-CM

## 2019-01-17 DIAGNOSIS — Z794 Long term (current) use of insulin: Secondary | ICD-10-CM

## 2019-01-17 MED ORDER — ISOSORBIDE MONONITRATE ER 120 MG PO TB24: 120.0000 mg | ORAL_TABLET | Freq: Two times a day (BID) | ORAL | 2 refills | Status: DC

## 2019-01-17 MED FILL — SPIRONOLACTONE 25 MG TABS: 25 | 90 days supply | Qty: 90 | Fill #0

## 2019-01-17 MED FILL — NITROGLYCERIN 0.4 MG TAB SL: 0.4 | 30 days supply | Qty: 25 | Fill #0

## 2019-01-17 MED FILL — GABAPENTIN 300 MG CAPSULE: 300 | 30 days supply | Qty: 30 | Fill #0

## 2019-01-17 MED FILL — FUROSEMIDE 20 MG TABS: 20 | 90 days supply | Qty: 90 | Fill #0

## 2019-01-17 MED FILL — ISOSORBIDE MONONITRATE ER 1: 120 | 30 days supply | Qty: 60 | Fill #0

## 2019-01-17 NOTE — Telephone Encounter (Signed)
Requesting to speak with a nurse about insulin. Please call back.

## 2019-01-17 NOTE — Telephone Encounter (Signed)
Rx(s) sent to pharmacy electronically.  

## 2019-01-17 NOTE — Telephone Encounter (Signed)
NTG SL refilled. 

## 2019-01-17 NOTE — Telephone Encounter (Signed)
[name] would you please call pt and help her with insulin issues

## 2019-01-18 ENCOUNTER — Other Ambulatory Visit: Payer: Self-pay | Admitting: Dietician

## 2019-01-18 DIAGNOSIS — E1140 Type 2 diabetes mellitus with diabetic neuropathy, unspecified: Secondary | ICD-10-CM

## 2019-01-18 DIAGNOSIS — Z794 Long term (current) use of insulin: Principal | ICD-10-CM

## 2019-01-18 MED ORDER — INSULIN ASPART 100 UNIT/ML FLEXPEN: 6.0000 [IU] | PEN_INJECTOR | Freq: Three times a day (TID) | SUBCUTANEOUS | 3 refills | Status: DC

## 2019-01-18 MED FILL — NOVOLOG FLEXPEN SYRINGE: 100 | 83 days supply | Qty: 15 | Fill #0

## 2019-01-18 NOTE — Telephone Encounter (Signed)
Asked to call Ms Linskey about her insulin question. She says her pharmacy needs updated prescription for Novolog Flexpen with new doses. I read the current prescription to her and she said it was not right, that she has been taking 6 units with all three meals- breakfast lunch and dinner.  Request updated prescription be sent to her [place], [place] 01/18/2019 9:01 AM.

## 2019-01-18 NOTE — Telephone Encounter (Addendum)
Asked to call [name] about her insulin. She request a new prescription for Novolog flexpen with updated doses per patient be sent to her pharmacy. She says she is now taking 6 units Novolog with each meal.

## 2019-01-18 NOTE — Addendum Note (Signed)
Addended by: Larey Dresser A on: 01/18/2019 02:49 PM   Modules accepted: Orders

## 2019-01-18 NOTE — Telephone Encounter (Signed)
done

## 2019-01-18 NOTE — Addendum Note (Signed)
Addended by: Resa Miner on: 01/18/2019 09:04 AM   Modules accepted: Orders

## 2019-01-24 MED FILL — TOUJEO SOLOSTAR 300 UNITS/M: 300 | 77 days supply | Qty: 9 | Fill #0

## 2019-02-10 MED FILL — NITROGLYCERIN 0.4 MG TAB SL: 0.4 | 30 days supply | Qty: 25 | Fill #1

## 2019-02-10 MED FILL — GABAPENTIN 300 MG CAPSULE: 300 | 30 days supply | Qty: 30 | Fill #1

## 2019-02-10 MED FILL — ISOSORBIDE MONONITRATE ER 1: 120 | 30 days supply | Qty: 60 | Fill #1

## 2019-02-21 ENCOUNTER — Telehealth: Payer: Self-pay | Admitting: Dietician

## 2019-02-21 NOTE — Telephone Encounter (Signed)
Left voicemail for return call [name], RD 02/21/2019 4:18 PM.

## 2019-02-22 NOTE — Telephone Encounter (Signed)
I called and explained the reasons for the SGLT2i being proposed for her and side effects. She said she pees enough already and is afraid of dehydration and said "I am going to pass". She is content with her current medications, reports her sugars are good most of the time. Kids and grandchildren bringing her what she needs- greens and veggies, except on occassions and that's when her sugars are higher. She appreciates [place] thinking of her.

## 2019-02-22 NOTE — Telephone Encounter (Signed)
Thanks

## 2019-03-01 ENCOUNTER — Ambulatory Visit: Payer: Medicare Other | Admitting: Internal Medicine

## 2019-03-09 ENCOUNTER — Other Ambulatory Visit: Payer: Self-pay | Admitting: Internal Medicine

## 2019-03-12 ENCOUNTER — Other Ambulatory Visit: Payer: Self-pay | Admitting: Medical

## 2019-03-12 ENCOUNTER — Other Ambulatory Visit: Payer: Self-pay | Admitting: Internal Medicine

## 2019-03-12 DIAGNOSIS — G629 Polyneuropathy, unspecified: Secondary | ICD-10-CM

## 2019-03-12 MED FILL — ISOSORBIDE MONONITRATE ER 1: 120 | 30 days supply | Qty: 60 | Fill #2

## 2019-03-12 MED FILL — NITROGLYCERIN 0.4 MG TAB SL: 0.4 | 30 days supply | Qty: 25 | Fill #2

## 2019-03-12 MED FILL — ANASTROZOLE 1 MG TABLET: 1 | 90 days supply | Qty: 90 | Fill #0

## 2019-03-12 MED FILL — metFORMIN HCL 1000 MG TABS: 1000 | 90 days supply | Qty: 180 | Fill #0

## 2019-03-14 ENCOUNTER — Other Ambulatory Visit: Payer: Self-pay | Admitting: Internal Medicine

## 2019-03-14 MED FILL — LOSARTAN POTASSIUM 50 MG TA: 50 | 30 days supply | Qty: 30 | Fill #0

## 2019-03-15 NOTE — Telephone Encounter (Addendum)
Please advise refill and/or what provider.

## 2019-03-19 MED FILL — GABAPENTIN 300 MG CAPSULE: 300 | 90 days supply | Qty: 90 | Fill #0

## 2019-03-26 ENCOUNTER — Encounter: Payer: Self-pay | Admitting: Adult Health

## 2019-03-26 DIAGNOSIS — C50611 Malignant neoplasm of axillary tail of right female breast: Secondary | ICD-10-CM | POA: Insufficient documentation

## 2019-03-28 ENCOUNTER — Telehealth: Payer: Self-pay | Admitting: Adult Health

## 2019-03-28 NOTE — Telephone Encounter (Signed)
I LEFT a message regarding visit change

## 2019-04-02 ENCOUNTER — Other Ambulatory Visit: Payer: Self-pay | Admitting: Internal Medicine

## 2019-04-02 DIAGNOSIS — E1140 Type 2 diabetes mellitus with diabetic neuropathy, unspecified: Secondary | ICD-10-CM

## 2019-04-06 MED FILL — TOUJEO SOLOSTAR 300 UNITS/M: 300 | 90 days supply | Qty: 9 | Fill #0

## 2019-04-06 MED FILL — NOVOLOG FLEXPEN SYRINGE: 100 | 83 days supply | Qty: 15 | Fill #1

## 2019-04-09 ENCOUNTER — Other Ambulatory Visit: Payer: Self-pay | Admitting: Internal Medicine

## 2019-04-09 MED FILL — LOSARTAN POTASSIUM 50 MG TA: 50 | 30 days supply | Qty: 30 | Fill #1

## 2019-04-11 ENCOUNTER — Other Ambulatory Visit: Payer: Self-pay | Admitting: Cardiology

## 2019-04-11 MED FILL — ISOSORBIDE MONONITRATE ER 1: 120 | 30 days supply | Qty: 60 | Fill #0

## 2019-04-11 MED FILL — FUROSEMIDE 20 MG TABS: 20 | 90 days supply | Qty: 90 | Fill #1

## 2019-04-12 ENCOUNTER — Other Ambulatory Visit: Payer: Self-pay | Admitting: Internal Medicine

## 2019-04-12 MED FILL — SPIRONOLACTONE 25 MG TABS: 25 | 90 days supply | Qty: 90 | Fill #0

## 2019-04-12 NOTE — Telephone Encounter (Signed)
pls sch PCP aug / sep

## 2019-04-18 ENCOUNTER — Telehealth: Payer: Self-pay | Admitting: Adult Health

## 2019-04-18 DIAGNOSIS — Z170 Estrogen receptor positive status [ER+]: Secondary | ICD-10-CM | POA: Diagnosis not present

## 2019-04-18 DIAGNOSIS — C50112 Malignant neoplasm of central portion of left female breast: Secondary | ICD-10-CM | POA: Diagnosis not present

## 2019-04-18 NOTE — Telephone Encounter (Signed)
Called pt and left messege on voice mail.

## 2019-04-19 ENCOUNTER — Encounter: Payer: Self-pay | Admitting: Adult Health

## 2019-04-19 ENCOUNTER — Inpatient Hospital Stay: Payer: Medicare Other | Attending: Adult Health | Admitting: Adult Health

## 2019-04-19 DIAGNOSIS — Z9013 Acquired absence of bilateral breasts and nipples: Secondary | ICD-10-CM | POA: Diagnosis not present

## 2019-04-19 DIAGNOSIS — Z923 Personal history of irradiation: Secondary | ICD-10-CM

## 2019-04-19 DIAGNOSIS — Z7982 Long term (current) use of aspirin: Secondary | ICD-10-CM

## 2019-04-19 DIAGNOSIS — Z79899 Other long term (current) drug therapy: Secondary | ICD-10-CM

## 2019-04-19 DIAGNOSIS — C50611 Malignant neoplasm of axillary tail of right female breast: Secondary | ICD-10-CM

## 2019-04-19 DIAGNOSIS — E2839 Other primary ovarian failure: Secondary | ICD-10-CM

## 2019-04-19 DIAGNOSIS — Z9221 Personal history of antineoplastic chemotherapy: Secondary | ICD-10-CM | POA: Diagnosis not present

## 2019-04-19 DIAGNOSIS — Z171 Estrogen receptor negative status [ER-]: Secondary | ICD-10-CM

## 2019-04-19 NOTE — Progress Notes (Signed)
SURVIVORSHIP VIRTUAL VISIT:  I connected with [name] on 04/19/19 at 10:00 AM EDT by telephone and verified that I am speaking with the correct person using two identifiers.   I discussed the limitations, risks, security and privacy concerns of performing an evaluation and management service by telephone and the availability of in person appointments. I also discussed with the patient that there may be a patient responsible charge related to this service. The patient expressed understanding and agreed to proceed.   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 S/p lumpectomy and radiation.   03/21/2018 Initial Diagnosis   [place] skin anterior left breast: Ultrasound 7 o'clock position 2 cm from nipple irregular hypoechoic mass 2.2 x 2.5 x 2.5 cm (1 cm medial to prior lumpectomy); 12:00: Hypoechoic mass extending to skin 3.9 x 2.9 x 3.6 cm market skin thickening, no axillary lymph nodes   03/21/2018 Initial Biopsy   Left breast 12 o'clock position: IDC grade 3; 7 o'clock position: IDC grade 3, ER 40%, PR 0%, Ki-67 80%, HER-2 equivocal by FISH ratio 1.17 copy #4.4, IHC 1+ negative, T4d N0 stage IIIc clinical stage    04/20/2018 Imaging   IMPRESSION: 1. Multiple masses within the left breast (inflammatory breast carcinoma.) 2. Cortically thickened right axillary lymph node, indeterminate. Metastatic disease not excluded (patient refused biopsy)   05/09/2018 - 06/20/2018 Neo-Adjuvant Chemotherapy   Neoadjuvant chemotherapy with TC X 4   07/20/2018 Surgery   Bilateral mastectomies: Left mastectomy: Grade 3 IDC 6.5 cm, 1/2 lymph nodes positive, ER 40%, PR 0%, HER-2 negative, Ki-67 80%; right axillary lymph node 7/12 LN pos ER 0%, PR 0%, HER-2 negative, Ki-67 90% RCB class III; right mastectomy: Benign    08/02/2018 Cancer Staging   Staging form: Breast, AJCC 8th Edition - Clinical: Stage IIIC (cT4d, cN0, cM0, G3, ER+, PR-, HER2-) - Signed by Gardenia Phlegm, NP on 08/02/2018   08/02/2018 Cancer Staging   Staging form: Breast, AJCC 8th Edition - Pathologic: No Stage Recommended (ypT4d, pN1a, cM0, G3, ER-, PR-, HER2-) - Signed by Eppie Gibson, MD on 09/15/2018   10/02/2018 - 11/09/2018 Radiation Therapy   Adjuvant XRT   12/21/2018 -  Anti-estrogen oral therapy   Anastrozole 79m daily, plan for 7 years     INTERVAL HISTORY:  [name] review her survivorship care plan detailing her treatment course for breast cancer, as well as monitoring long-term side effects of that treatment, education regarding health maintenance, screening, and overall wellness and health promotion.     Overall, Ms. BHoltryreports feeling quite well. She spent about 10 minutes reviewing her concerns with me about the telephone call she made to our cancer center asking about this appointment and not getting a call back.  She is taking Anastrozole daily.  She notes that she thinks the cancer is from the radiation and doesn't understand why she has to take it. DOreeis having some achiness.  She has some muscle cramping.  She notes it is difficult to sit down or stand up for too long due to cramping of her muscles.  DNefertariis practicing range of motion.  She takes tylenol for her pain, which doesn't typically help.  DKennideedoesn't get fatigued, however she notes that she has some difficulty with breathing at night, when lying down.  She does  wear a CPAP.    [name] notes some numbness that is mainly at night and intermittent depending on her sleeping position.  She takes Gabapentin for this.  [name] notes an increase in indigestion.    Simone saw Dr. Marlou Starks yesterday and he noted everything looked great and he will see her in 6 months.    REVIEW OF SYSTEMS:  Review of Systems  Constitutional: Negative for  appetite change, chills, fatigue and unexpected weight change.  HENT:   Negative for hearing loss, lump/mass, mouth sores and trouble swallowing.   Eyes: Negative for eye problems and icterus.  Respiratory: Positive for shortness of breath (at night, on cpap). Negative for chest tightness and cough.   Cardiovascular: Positive for chest pain (has h/o angina, it is at night). Negative for leg swelling and palpitations.  Gastrointestinal: Negative for abdominal distention, abdominal pain, constipation, diarrhea, nausea and vomiting.  Endocrine: Negative for hot flashes.  Musculoskeletal: Negative for arthralgias.  Skin: Negative for itching and rash.  Neurological: Negative for dizziness, extremity weakness, headaches and numbness.  Hematological: Negative for adenopathy. Does not bruise/bleed easily.  Psychiatric/Behavioral: Negative for depression. The patient is not nervous/anxious.    Breast: Denies any new nodularity, masses, tenderness, nipple changes, or nipple discharge.      ONCOLOGY TREATMENT TEAM:  1. Surgeon:  Dr. Marlou Starks at [facility] 2. Medical Oncologist: Dr. Lindi Adie  3. Radiation Oncologist: Dr. Isidore Moos    PAST MEDICAL/SURGICAL HISTORY:  Past Medical History:  Diagnosis Date  . Arthritis    "knees, lower back" (08/18/2017)  . 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 S/p lumpectomy and radiation.  Marland Kitchen CAD (coronary artery disease) 11/07   cath 11/11 :  3V CADsee report   . Cataract    small both eyes  . Chronic lower back pain   . Chronic pain    MR 08/07/10 :  Spondylosis L4-5 with B foraminal narrowing and L4 nerve root enchroachment B.  . Chronic venous insufficiency 2012   Has had full W/U incl ECHO, LFT's, Cr, Umicro, TSH, BNP. Symptomatic treatment.  . Colonic polyp 1995   Colonoscopies 1994, 2000, and 02/02/2011. Last had 9 polyps - Tubular adenoma and tubulovillous was the pathology results without  high grade dysplasia  . Diabetes mellitus type II    Insulin dependent. Has worked with [name] R previously.  . Diastolic CHF, chronic ([place])    NL EF by echo 01/2012, Gr I dd  . Essential hypertension    Requires 5 drug therapy and still difficult to control.  Marland Kitchen GERD (gastroesophageal reflux disease)   . Heart murmur   . History of radiation therapy 10/02/18- 11/09/18   Left chest wall SCV, Left chest wall boost, Right chest wall SCV, right chest boost all received 28 fractions for a total dose of 50.04 Gy  . Hyperlipidemia    On daily statin  . Iron deficiency anemia    Ferritin was 12 in 2012. on FeSo4. Has had colon and EGD 02/02/11 that showed mild gastritis and colon polyps.  . Obesity    Morbid. HAs worked with [name] T previously.  . OSA (obstructive sleep apnea) 02/16/2007   02/2007 : Moderate AHI 35.6, O2 sat decreased to 65%, CPAP titration to 16 with AHI of 3.6. 02/2014 : Rare respiratory events with sleep disturbance, within normal limits. AHI 0.4 per hour      . OSA on CPAP   . Personal history  of chemotherapy   . Personal history of radiation therapy    "to my left breast"  . Refusal of blood transfusions as patient is Jehovah's Witness   . Seasonal allergies    "grass pollen"   Past Surgical History:  Procedure Laterality Date  . BREAST LUMPECTOMY Left 2006  . BREAST LUMPECTOMY W/ NEEDLE LOCALIZATION  2006   34 Chemo RX  . CARDIAC CATHETERIZATION N/A 08/15/2015   Procedure: Left Heart Cath and Coronary Angiography;  Surgeon: Troy Sine, MD;  Location: [facility];  Service: Cardiovascular;  Laterality: N/A;  . COLONOSCOPY W/ BIOPSIES AND POLYPECTOMY  1994, 2000, 2012, 26/018  . IR IMAGING GUIDED PORT INSERTION  04/17/2018  . IR REMOVAL TUN ACCESS W/ PORT W/O FL MOD SED  11/14/2018  . LEFT HEART CATH AND CORONARY ANGIOGRAPHY N/A 08/19/2017   Procedure: LEFT HEART CATH AND CORONARY ANGIOGRAPHY;  Surgeon: Belva Crome, MD;  Location: [place] [facility];  Service:  Cardiovascular;  Laterality: N/A;  . MASTECTOMY MODIFIED RADICAL Bilateral 07/20/2018  . MASTECTOMY MODIFIED RADICAL Bilateral 07/20/2018   Procedure: BILATERAL MODIFIED RADICAL MASTECTOMIES;  Surgeon: Jovita Kussmaul, MD;  Location: [facility];  Service: General;  Laterality: Bilateral;  . PORTACATH PLACEMENT N/A 04/14/2018   Procedure: ATTEMPED INSERTION PORT-A-CATH;  Surgeon: Jovita Kussmaul, MD;  Location: [facility];  Service: General;  Laterality: N/A;  . TONSILLECTOMY AND ADENOIDECTOMY  1964  . TOTAL ABDOMINAL HYSTERECTOMY  1985   "I had endometrosis"  . ULTRASOUND GUIDANCE FOR VASCULAR ACCESS  08/19/2017   Procedure: Ultrasound Guidance For Vascular Access;  Surgeon: Belva Crome, MD;  Location: [facility];  Service: Cardiovascular;;     ALLERGIES:  Allergies  Allergen Reactions  . Blood-Group Specific Substance Other (See Comments)    NO BLOOD - JEHOVAH'S WITNESS  . Lisinopril Other (See Comments)    Chronic cough secondary to ACE  . Oxycodone-Acetaminophen Hives, Itching and Other (See Comments)    flushing  . Percocet [Oxycodone-Acetaminophen] Hives  . Victoza [Liraglutide] Other (See Comments)    Made her feel "out of it"  . Atorvastatin Other (See Comments)    myalgias     CURRENT MEDICATIONS:  Outpatient Encounter Medications as of 04/19/2019  Medication Sig  . ACCU-CHEK FASTCLIX LANCETS MISC Check blood sugar 3 times a day (Patient taking differently: 1 each by Other route 3 (three) times daily. )  . ACCU-CHEK GUIDE test strip USE 1 STRIP THREE TIMES DAILY  . amLODipine (NORVASC) 10 MG tablet TAKE ONE (1) TABLET BY MOUTH EVERY DAY  . anastrozole (ARIMIDEX) 1 MG tablet Take 1 tablet (1 mg total) by mouth daily.  Marland Kitchen aspirin EC 81 MG tablet Take 81 mg by mouth daily.  . Coenzyme Q10 (CO Q 10) 100 MG CAPS Take 100 mg by mouth daily.   . diclofenac (FLECTOR) 1.3 % PTCH Place 1 patch onto the skin 2 (two) times daily.  . furosemide (LASIX) 20 MG tablet TAKE 1 TABLET (20  MG TOTAL) BY MOUTH AS NEEDED FOR EDEMA.  Marland Kitchen gabapentin (NEURONTIN) 300 MG capsule TAKE ONE CAPSULE BY MOUTH AT BEDTIME  . insulin aspart (NOVOLOG FLEXPEN) 100 UNIT/ML FlexPen Inject 6 Units into the skin 3 (three) times daily with meals.  . Insulin Pen Needle 32G X 4 MM MISC USE FOR INJECTIONS up to 4 times daily. Diagnosis code E11.40, Z79.4  . isosorbide mononitrate (IMDUR) 120 MG 24 hr tablet Take 1 tablet (120 mg total) by mouth  2 (two) times daily. NEED OV.  Marland Kitchen losartan (COZAAR) 50 MG tablet TAKE ONE (1) TABLET BY MOUTH EVERY DAY  . meloxicam (MOBIC) 7.5 MG tablet TAKE 1 TABLET BY MOUTH ONCE DAILY AS NEEDED FOR PAIN  . metFORMIN (GLUCOPHAGE) 1000 MG tablet Take 1 tablet (1,000 mg total) by mouth 2 (two) times daily with a meal.  . methocarbamol (ROBAXIN) 750 MG tablet Take 1 tablet (750 mg total) by mouth every 6 (six) hours as needed for muscle spasms.  . metoprolol succinate (TOPROL-XL) 100 MG 24 hr tablet TAKE ONE (1) TABLET BY MOUTH EVERY DAY  . nitroGLYCERIN (NITROSTAT) 0.4 MG SL tablet PLACE ONE TABLET UNDER THE TONGUE EVERY 5 MINUTES AS NEEDED FOR CHEST PAIN  . Olopatadine HCl (PATADAY) 0.2 % SOLN Place 1 drop into both eyes daily as needed (for allergies).   Marland Kitchen omeprazole (PRILOSEC) 40 MG capsule Take 1 capsule (40 mg total) by mouth daily. (Patient taking differently: Take 40 mg by mouth daily as needed (for acid reflux). )  . pyridoxine (B-6) 100 MG tablet Take 100 mg by mouth daily.  Marland Kitchen spironolactone (ALDACTONE) 25 MG tablet TAKE ONE TABLET BY MOUTH DAILY  . TOUJEO SOLOSTAR 300 UNIT/ML SOPN INJECT 35 UNITS INTO THE SKIN DAILY  . traMADol (ULTRAM) 50 MG tablet Take 1 tablet (50 mg total) by mouth every 12 (twelve) hours as needed. (Patient taking differently: Take 50 mg by mouth every 12 (twelve) hours as needed for moderate pain. )  . vitamin B-12 (CYANOCOBALAMIN) 1000 MCG tablet Take 1,000 mcg by mouth daily.   No facility-administered encounter medications on file as of 04/19/2019.       ONCOLOGIC FAMILY HISTORY:  Family History  Problem Relation Age of Onset  . Lung cancer Mother   . Diabetes Father   . Breast cancer Sister   . Pulmonary embolism Brother   . Depression Daughter 61  . Osteoarthritis Maternal Grandmother   . Heart attack Brother 57  . Kidney failure Brother        died 12/08/15 2/2 kidney and heart failure  . Colon cancer Neg Hx   . Colon polyps Neg Hx   . Esophageal cancer Neg Hx   . Rectal cancer Neg Hx   . Stomach cancer Neg Hx      GENETIC COUNSELING/TESTING: Not at this time  SOCIAL HISTORY:  Social History   Socioeconomic History  . Marital status: Legally Separated    Spouse name: Not on file  . Number of children: Not on file  . Years of education: 6  . Highest education level: Not on file  Occupational History  . Not on file  Social Needs  . Financial resource strain: Not on file  . Food insecurity    Worry: Not on file    Inability: Not on file  . Transportation needs    Medical: Yes    Non-medical: No  Tobacco Use  . Smoking status: Never Smoker  . Smokeless tobacco: Never Used  Substance and Sexual Activity  . Alcohol use: No  . Drug use: No  . Sexual activity: Not Currently  Lifestyle  . Physical activity    Days per week: Not on file    Minutes per session: Not on file  . Stress: Not at all  Relationships  . Social Herbalist on phone: Not on file    Gets together: Not on file    Attends religious service: Not on file  Active member of club or organization: Not on file    Attends meetings of clubs or organizations: Not on file    Relationship status: Not on file  . Intimate partner violence    Fear of current or ex partner: No    Emotionally abused: No    Physically abused: No    Forced sexual activity: No  Other Topics Concern  . Not on file  Social History Narrative   Worked at [employer] part-time from [phone], 2009-2011. No longer working 2015.Associates degree in early  childhood education. Single, lives by self.     OBSERVATIONS/OBJECTIVE:   Patient sounds well.  She is in no apparent distress.  Breathing is non labored.  Mood and behavior are normal.    LABORATORY DATA:  None for this visit.  DIAGNOSTIC IMAGING:  None for this visit.      ASSESSMENT AND PLAN:  Ms.. Davern is a pleasant 68 y.o. 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  She presents to the Survivorship Clinic for our initial meeting and routine follow-up post-completion of treatment for breast cancer.    1. Bilateral breast cancer:  Ms. Davidow is continuing to recover from definitive treatment for breast cancer. She will follow-up with her medical oncologist, Dr. Lindi Adie in 3 months with history and physical exam per surveillance protocol.  She will continue her anti-estrogen therapy with Anastrozole. Thus far, she is tolerating the Anastrozole moderately well.  She and I reviewed in detail the purpose of anastrozole and how it works to prevent cancer recurrence.  She verbalized understanding.    Today, a comprehensive survivorship care plan and treatment summary was reviewed with the patient today detailing her breast cancer diagnosis, treatment course, potential late/long-term effects of treatment, appropriate follow-up care with recommendations for the future, and patient education resources.  A copy of this summary, along with a letter will be sent to the patient's primary care provider via mail/fax/In Basket message after today's visit.    2. Angina: This is slightly worse than prior.  I recommended she f/u with her PCP or cardiologist promptly as it sounds like it may be worsening.  She understands this.    3. Bone health:  Given Ms. Buccheri's age/history  of breast cancer and her current treatment regimen including anti-estrogen therapy with Anastrozole, she is at risk for bone demineralization.  She hasn't undergone bone density testing, and I ordered this to be done in the next month.  In the meantime, she was encouraged to increase her consumption of foods rich in calcium, as well as increase her weight-bearing activities.  She was given education on specific activities to promote bone health.  4. Cancer screening:  Due to Ms. Crocker's history and her age, she should receive screening for skin cancers, colon cancer, and gynecologic cancers.  The information and recommendations are listed on the patient's comprehensive care plan/treatment summary and were reviewed in detail with the patient.    5. Health maintenance and wellness promotion: Ms. Wandel was encouraged to consume 5-7 servings of fruits and vegetables per day. We reviewed the "Nutrition Rainbow" handout, as well as the handout "Take Control of Your Health and Reduce Your Cancer Risk" from the [place].  She was also encouraged to engage in moderate to vigorous exercise for 30 minutes per day most  days of the week. We discussed the [employer] fitness program, which is designed for cancer survivors to help them become more physically fit after cancer treatments.  She was instructed to limit her alcohol consumption and continue to abstain from tobacco use.     6. Support services/counseling: It is not uncommon for this period of the patient's cancer care trajectory to be one of many emotions and stressors.  We discussed how this can be increasingly difficult during the times of quarantine and social distancing due to the COVID-19 pandemic.   She was given information regarding our available services and encouraged to contact me with any questions or for help enrolling in any of our support group/programs.    Follow up instructions:    -Return to cancer center 3 months for f/u  with Dr. Lindi Adie -Bone density testing in 1 month  -Follow up with Dr. Marlou Starks in 6 months -She is welcome to return back to the Survivorship Clinic at any time; no additional follow-up needed at this time.  -Consider referral back to survivorship as a long-term survivor for continued surveillance  The patient was provided an opportunity to ask questions and all were answered. The patient agreed with the plan and demonstrated an understanding of the instructions.   The patient was advised to call back or seek an in-person evaluation if the symptoms worsen or if the condition fails to improve as anticipated.   I provided 32 minutes of non face-to-face telephone visit time during this encounter, and > 50% was spent counseling as documented under my assessment & plan.  Scot Dock, NP

## 2019-04-20 ENCOUNTER — Telehealth: Payer: Self-pay | Admitting: Hematology and Oncology

## 2019-04-20 NOTE — Telephone Encounter (Signed)
I could not reach regarding schedule I will mail

## 2019-04-24 ENCOUNTER — Telehealth: Payer: Self-pay

## 2019-04-24 ENCOUNTER — Other Ambulatory Visit: Payer: Self-pay

## 2019-04-24 ENCOUNTER — Ambulatory Visit
Admission: RE | Admit: 2019-04-24 | Discharge: 2019-04-24 | Disposition: A | Discharge status: Home / Self Care | Payer: Medicare Other | Source: Ambulatory Visit | Attending: Adult Health | Admitting: Adult Health

## 2019-04-24 DIAGNOSIS — Z13820 Encounter for screening for osteoporosis: Secondary | ICD-10-CM | POA: Diagnosis not present

## 2019-04-24 DIAGNOSIS — Z780 Asymptomatic menopausal state: Secondary | ICD-10-CM | POA: Diagnosis not present

## 2019-04-24 DIAGNOSIS — E2839 Other primary ovarian failure: Secondary | ICD-10-CM

## 2019-04-24 NOTE — Telephone Encounter (Signed)
-----   Message from Gardenia Phlegm, NP sent at 04/24/2019  1:41 PM EDT ----- Bone density was normal.  Please notify patient. ----- Message ----- From: Interface, Rad Results In Sent: 04/24/2019  12:43 PM EDT To: Gardenia Phlegm, NP

## 2019-04-24 NOTE — Telephone Encounter (Signed)
Spoke with patient to inform of normal bone density results.  Patient voiced understanding and thanks.

## 2019-05-05 ENCOUNTER — Other Ambulatory Visit: Payer: Self-pay | Admitting: Cardiology

## 2019-05-07 MED FILL — ISOSORBIDE MONONITRATE ER 1: 120 | 30 days supply | Qty: 60 | Fill #0

## 2019-05-07 MED FILL — LOSARTAN POTASSIUM 50 MG TA: 50 | 30 days supply | Qty: 30 | Fill #2

## 2019-05-23 ENCOUNTER — Telehealth: Payer: Self-pay | Admitting: *Deleted

## 2019-05-23 ENCOUNTER — Ambulatory Visit: Payer: Medicare Other

## 2019-05-23 NOTE — Telephone Encounter (Signed)
Check with Tamela Oddi as might have added on extra appt this AM as Dr Marianna Payment here today

## 2019-05-23 NOTE — Telephone Encounter (Signed)
Appt sch for 05/23/2019 @ 10:45am with the [facility].

## 2019-05-23 NOTE — Telephone Encounter (Signed)
Patient called in stating she is "swollen all over," taking 3 "water pills" per day, and sleeping with 3 pillows. Patient able to speak in full sentences. No [facility] appts available today. Patient given 1045 appt tomorrow with instructions to head directly to ED if [facility] increases. She states understanding. Hubbard Hartshorn, RN, BSN

## 2019-05-23 NOTE — Telephone Encounter (Signed)
Have set appt for 1315, pt states she must make some calls before she can agree, she will call clinic back, she called back and cannot come to clinic until tomorrow, appt [facility] 1045

## 2019-05-24 ENCOUNTER — Other Ambulatory Visit: Payer: Self-pay

## 2019-05-24 ENCOUNTER — Encounter: Payer: Self-pay | Admitting: Internal Medicine

## 2019-05-24 ENCOUNTER — Ambulatory Visit (INDEPENDENT_AMBULATORY_CARE_PROVIDER_SITE_OTHER): Payer: Medicare Other | Admitting: Internal Medicine

## 2019-05-24 VITALS — BP 153/78 | HR 68 | Temp 98.1°F | Ht 61.0 in | Wt 207.7 lb

## 2019-05-24 DIAGNOSIS — D508 Other iron deficiency anemias: Secondary | ICD-10-CM | POA: Diagnosis not present

## 2019-05-24 DIAGNOSIS — Z7982 Long term (current) use of aspirin: Secondary | ICD-10-CM

## 2019-05-24 DIAGNOSIS — Z79899 Other long term (current) drug therapy: Secondary | ICD-10-CM

## 2019-05-24 DIAGNOSIS — I5032 Chronic diastolic (congestive) heart failure: Secondary | ICD-10-CM

## 2019-05-24 NOTE — Progress Notes (Signed)
CC: shortness of breath, swelling   HPI:  [name] is a 68 y.o. female with PMHx listed below who presents for 3 week history of shortness of breath and generalized swelling. Please see problem based charting for further details.   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 S/p lumpectomy and radiation.   CAD (coronary artery disease) 11/07   cath 11/11 :  3V CADsee report    Cataract    small both eyes   Chronic lower back pain    Chronic pain    MR 08/07/10 :  Spondylosis L4-5 with B foraminal narrowing and L4 nerve root enchroachment B.   Chronic venous insufficiency 2012   Has had full W/U incl ECHO, LFT's, Cr, Umicro, TSH, BNP. Symptomatic treatment.   Colonic polyp 1995   Colonoscopies 1994, 2000, and 02/02/2011. Last had 9 polyps - Tubular adenoma and tubulovillous was the pathology results without high grade dysplasia   Diabetes mellitus type II    Insulin dependent. Has worked with [name] R previously.   Diastolic CHF, chronic ([facility])    NL EF by echo 01/2012, Gr I dd   Essential hypertension    Requires 5 drug therapy and still difficult to control.   GERD (gastroesophageal reflux disease)    Heart murmur    History of radiation therapy 10/02/18- 11/09/18   Left chest wall SCV, Left chest wall boost, Right chest wall SCV, right chest boost all received 28 fractions for a total dose of 50.04 Gy   Hyperlipidemia    On daily statin   Iron deficiency anemia    Ferritin was 12 in 2012. on FeSo4. Has had colon and EGD 02/02/11 that showed mild gastritis and colon polyps.   Obesity    Morbid. HAs worked with [name] T previously.   OSA (obstructive sleep apnea) 02/16/2007   02/2007 : Moderate AHI 35.6, O2 sat decreased to 65%, CPAP titration to 16 with AHI of 3.6. 02/2014 : Rare respiratory events with sleep disturbance,  within normal limits. AHI 0.4 per hour       OSA on CPAP    Personal history of chemotherapy    Personal history of radiation therapy    "to my left breast"   Refusal of blood transfusions as patient is Jehovah's Witness    Seasonal allergies    "grass pollen"   Review of Systems:   Review of Systems  Constitutional: Negative for chills and fever.  Respiratory: Positive for shortness of breath. Negative for cough.   Cardiovascular: Positive for orthopnea and leg swelling.  Gastrointestinal: Negative for abdominal pain.  Genitourinary: Negative for dysuria and hematuria.  Neurological: Positive for dizziness. Negative for sensory change, focal weakness, loss of consciousness and headaches.    Physical Exam:  Vitals:   05/24/19 1040 05/24/19 1048  BP: (!) 149/74 (!) 153/78  Pulse: 69 68  Temp: 98.1 F (36.7 C)   TempSrc: Oral   SpO2: 100%   Weight: 207 lb 11.2 oz (94.2 kg)   Height: 5\' 1"  (1.549 m)    Physical Exam Constitutional:      General: She is not in acute distress. Eyes:     Conjunctiva/sclera: Conjunctivae normal.  Neck:     Vascular: No hepatojugular reflux or JVD.  Cardiovascular:     Rate and Rhythm: Normal rate and  regular rhythm.     Pulses: Normal pulses.  Pulmonary:     Effort: Pulmonary effort is normal.     Breath sounds: Normal breath sounds.  Abdominal:     General: There is no distension.     Palpations: Abdomen is soft.     Tenderness: There is no abdominal tenderness.  Musculoskeletal:     Comments: Trace pitting edema in BLE   Neurological:     General: No focal deficit present.     Mental Status: She is alert and oriented to person, place, and time.  Psychiatric:        Mood and Affect: Mood normal.        Behavior: Behavior normal.      Assessment & Plan:   See Encounters Tab for problem based charting.  Patient discussed with Dr. Evette Doffing

## 2019-05-24 NOTE — Assessment & Plan Note (Addendum)
Patient presents with 3 week history of generalized edema, DOE, and orthopnea. She increased her Furosemide 20 mg PRN to TID which she notes significantly helped the swelling. Her weight is unchanged from last visit. Exam is reassuring today. O2 sat is normal. She has no crackles or JVD. Minimal trace edema in BLE. Will have her continue taking the Furosemide 60 mg for 3 more days to treat a presumed HFpEF exacerbation. If she is still reporting Class III symptoms at PCP visit in 1 week, may need to pursue work-up for other possible etiologies such as pulmonary HTN or COPD. She has had an echo within the last year with preserved EF and G1DD.  - BMP today to ensure renal function is stable

## 2019-05-24 NOTE — Patient Instructions (Signed)
[name], Today we discussed your fluid overload and shortness of breath. You have done a great job cutting back your fluid and salt intake. I'd like you to continue the 3 fluid pills a day for 3 more days to see if we can get any more fluid off to help your symptoms.   I am checking some labs today and will let you know if there are any abnormalities.   You will follow-up with Dr. Lynnae January in 1 week. If you are still not improved, you may need to have further testing done to evaluate your shortness of breath.   Take care, Dr. Koleen Distance

## 2019-05-25 LAB — BMP8+ANION GAP
Anion Gap: 20 mmol/L — ABNORMAL HIGH (ref 10.0–18.0)
BUN/Creatinine Ratio: 19 (ref 12–28)
BUN: 20 mg/dL (ref 8–27)
CO2: 22 mmol/L (ref 20–29)
Calcium: 10.3 mg/dL (ref 8.7–10.3)
Chloride: 100 mmol/L (ref 96–106)
Creatinine, Ser: 1.08 mg/dL — ABNORMAL HIGH (ref 0.57–1.00)
GFR calc Af Amer: 61 mL/min/{1.73_m2} (ref >59)
GFR calc non Af Amer: 53 mL/min/{1.73_m2} — ABNORMAL LOW (ref >59)
Glucose: 111 mg/dL — ABNORMAL HIGH (ref 65–99)
Potassium: 4.6 mmol/L (ref 3.5–5.2)
Sodium: 142 mmol/L (ref 134–144)

## 2019-05-25 LAB — CBC WITH DIFFERENTIAL/PLATELET
Basophils Absolute: 0 10*3/uL (ref 0.0–0.2)
Basos: 0 %
EOS (ABSOLUTE): 0.1 10*3/uL (ref 0.0–0.4)
Eos: 2 %
Hematocrit: 37.3 % (ref 34.0–46.6)
Hemoglobin: 12.6 g/dL (ref 11.1–15.9)
Immature Grans (Abs): 0 10*3/uL (ref 0.0–0.1)
Immature Granulocytes: 0 %
Lymphocytes Absolute: 1.3 10*3/uL (ref 0.7–3.1)
Lymphs: 27 %
MCH: 31 pg (ref 26.6–33.0)
MCHC: 33.8 g/dL (ref 31.5–35.7)
MCV: 92 fL (ref 79–97)
Monocytes Absolute: 0.5 10*3/uL (ref 0.1–0.9)
Monocytes: 10 %
Neutrophils Absolute: 2.9 10*3/uL (ref 1.4–7.0)
Neutrophils: 61 %
Platelets: 209 10*3/uL (ref 150–450)
RBC: 4.07 x10E6/uL (ref 3.77–5.28)
RDW: 14.7 % (ref 11.7–15.4)
WBC: 4.9 10*3/uL (ref 3.4–10.8)

## 2019-05-25 MED FILL — METOPROLOL SUCCINATE ER 100: 100 | 90 days supply | Qty: 90 | Fill #0

## 2019-05-25 MED FILL — AMLODIPINE BESYLATE 10 MG T: 10 | 90 days supply | Qty: 90 | Fill #0

## 2019-05-28 NOTE — Progress Notes (Signed)
Internal Medicine Clinic Attending  Case discussed with Dr. Bloomfield at the time of the visit.  We reviewed the resident's history and exam and pertinent patient test results.  I agree with the assessment, diagnosis, and plan of care documented in the resident's note.  

## 2019-05-31 ENCOUNTER — Ambulatory Visit (INDEPENDENT_AMBULATORY_CARE_PROVIDER_SITE_OTHER): Payer: Medicare Other | Admitting: Internal Medicine

## 2019-05-31 ENCOUNTER — Other Ambulatory Visit: Payer: Self-pay

## 2019-05-31 ENCOUNTER — Other Ambulatory Visit: Payer: Self-pay | Admitting: Cardiology

## 2019-05-31 ENCOUNTER — Encounter: Payer: Self-pay | Admitting: Internal Medicine

## 2019-05-31 VITALS — BP 132/65 | HR 66 | Temp 98.4°F | Wt 210.9 lb

## 2019-05-31 DIAGNOSIS — R011 Cardiac murmur, unspecified: Secondary | ICD-10-CM

## 2019-05-31 DIAGNOSIS — M79652 Pain in left thigh: Secondary | ICD-10-CM | POA: Diagnosis not present

## 2019-05-31 DIAGNOSIS — E11319 Type 2 diabetes mellitus with unspecified diabetic retinopathy without macular edema: Secondary | ICD-10-CM | POA: Diagnosis not present

## 2019-05-31 DIAGNOSIS — R600 Localized edema: Secondary | ICD-10-CM

## 2019-05-31 DIAGNOSIS — G8929 Other chronic pain: Secondary | ICD-10-CM

## 2019-05-31 DIAGNOSIS — R269 Unspecified abnormalities of gait and mobility: Secondary | ICD-10-CM | POA: Diagnosis not present

## 2019-05-31 DIAGNOSIS — N3941 Urge incontinence: Secondary | ICD-10-CM

## 2019-05-31 DIAGNOSIS — I5032 Chronic diastolic (congestive) heart failure: Secondary | ICD-10-CM

## 2019-05-31 DIAGNOSIS — Z79899 Other long term (current) drug therapy: Secondary | ICD-10-CM | POA: Diagnosis not present

## 2019-05-31 DIAGNOSIS — Z7982 Long term (current) use of aspirin: Secondary | ICD-10-CM

## 2019-05-31 DIAGNOSIS — R6881 Early satiety: Secondary | ICD-10-CM | POA: Diagnosis not present

## 2019-05-31 DIAGNOSIS — E1165 Type 2 diabetes mellitus with hyperglycemia: Secondary | ICD-10-CM

## 2019-05-31 DIAGNOSIS — IMO0002 Reserved for concepts with insufficient information to code with codable children: Secondary | ICD-10-CM

## 2019-05-31 DIAGNOSIS — M5442 Lumbago with sciatica, left side: Secondary | ICD-10-CM

## 2019-05-31 DIAGNOSIS — G894 Chronic pain syndrome: Secondary | ICD-10-CM

## 2019-05-31 LAB — GLUCOSE, CAPILLARY: Glucose-Capillary: 145 mg/dL — ABNORMAL HIGH (ref 70–99)

## 2019-05-31 LAB — POCT GLYCOSYLATED HEMOGLOBIN (HGB A1C): Hemoglobin A1C: 8.1 % — AB (ref 4.0–5.6)

## 2019-05-31 NOTE — Progress Notes (Signed)
   Subjective:    Patient ID: [name], female    DOB: 06/04/1951, 68 y.o.   MRN: [id number]  HPI  EBBONY ADAN is here for volume overload follow-up. Please see the A&P for the status of the pt's chronic medical problems.  ROS : per ROS section and in problem oriented charting. All other systems are negative.  PMHx includes recurrent breast cancer in 2020 treated with surgery, radiation, and cyclophosphamide and docetaxel  Review of Systems  Constitutional: Positive for fatigue and unexpected weight change.  Respiratory: Positive for shortness of breath and wheezing.   Cardiovascular: Positive for leg swelling.       + PND  Gastrointestinal:       Early satiety, bloating  Genitourinary:       Positive for polyuria and urge incontinence with furosemide  Musculoskeletal: Positive for gait problem and myalgias.       Positive for muscle spasms       Objective:   Physical Exam Constitutional:      General: She is not in acute distress.    Appearance: Normal appearance. She is obese. She is not ill-appearing, toxic-appearing or diaphoretic.  HENT:     Head: Normocephalic and atraumatic.     Right Ear: External ear normal.     Left Ear: External ear normal.  Eyes:     General: No scleral icterus.       Left eye: No discharge.     Conjunctiva/sclera: Conjunctivae normal.  Cardiovascular:     Rate and Rhythm: Normal rate and regular rhythm.     Heart sounds: Murmur present.     Comments: +1 pitting edema bilaterally to knees Pulmonary:     Effort: Pulmonary effort is normal.     Breath sounds: Normal breath sounds. No wheezing or rhonchi.     Comments: Able to speak in full sentences without accessory muscle usage or respiratory distress Musculoskeletal:        General: No swelling, tenderness, deformity or signs of injury.  Skin:    General: Skin is warm and dry.  Neurological:     General: No focal deficit present.     Mental Status: She is alert. Mental  status is at baseline.  Psychiatric:        Mood and Affect: Mood normal.        Behavior: Behavior normal.        Thought Content: Thought content normal.        Judgment: Judgment normal.       Assessment & Plan:

## 2019-05-31 NOTE — Progress Notes (Signed)
8.1 

## 2019-05-31 NOTE — Assessment & Plan Note (Signed)
New problem : She has chronic pain but has a new pain today.  She states it came on suddenly after she finished her cancer treatment and was not preceded by a fall or other trauma.  It is of the left upper thigh but starts a little bit in the back, wraps around to the thigh anteriorly, and can go down to her ankle.  She describes it as a tight muscle which is extremely painful.  It is pretty much constant but if she sits down and does not move, she may not have any of the pain.  When she stands up, she has significant difficulty and may have trouble straightening that leg due to pain.  She also has trouble walking as sometimes that leg throws her and she has to catch herself.  She is taking hot baths, massaging it, doing range of motion exercises, and trying to walk.  Pain pills do nothing.  She attributes this to the radiation treatment.  That could be possible but I want to rule out treatable causes and discussed that this sounds like a radiculopathy of the left L3-L4. I rec an MRI which she agreed to do.  Other possibilities would include lateral femoral nerve entrapment syndrome but that would not explain the pain that goes down to her ankle or low back.   PLAN : MRI

## 2019-05-31 NOTE — Patient Instructions (Signed)
1. I am ordering a heart study and MRI of back 2. Try lasix 40 mg twice a day

## 2019-05-31 NOTE — Assessment & Plan Note (Signed)
This problem is chronic and uncontrolled.  Her echo in September 2019 showed grade 1 diastolic dysfunction with a normal EF. Last week, she was seen in [facility] with a 3-week history of generalized edema, DOE, and orthopnea.  She had increased her furosemide from 20  Qd to 20 TID and that helped.  She is still taking her furosemide 20 mg TID.  Her weight before this exacerbation was 200 pounds by patient report.  Her maximum during the episode was 217 pounds.  Currently she is at 210 pounds.  She continues to have symptoms although they are improved compared to her appointment on August 20.  She continues to have 3 pillow orthopnea, PND, edema, early satiety, and wheezing.  On exam, she has lower extremity edema but not proximal to the knees.  Her lungs are clear.  She is in no overt respiratory distress.  It seems like her edema is mostly peripheral.  Since her last echo, she has had radiation and chemotherapy, both of which can cause a cardiomyopathy.  She is also at high risk due to her known cardiac disease and other comorbid factors.  I have encouraged her to get a repeat echo.  Additionally, she is still volume up, as much as 10 pounds per her home scales.  I suspect she has abdominal edema and encouraged her to take her furosemide as a single dose to improve absorption and overcome the threshold.  She states that she cannot do that because it causes cramping of her heart.  She was in agreement to try a 40 mg dose and if can tolerate it, to try 40 mg twice daily.  She will return on Tuesday, September 1 for follow-up.  She will need a CMP at that time (to assess liver function and electrolytes).  PLAN : ECHO Attempt to increase furosemide to 40 BID If cannot tolerate that dose, resume furosemide 20 TID

## 2019-06-01 LAB — PROTEIN / CREATININE RATIO, URINE
Creatinine, Urine: 64.5 mg/dL
Protein, Ur: 4.5 mg/dL
Protein/Creat Ratio: 70 mg/g creat (ref 0–200)

## 2019-06-04 MED FILL — metFORMIN HCL 1000 MG TABS: 1000 | 90 days supply | Qty: 180 | Fill #1

## 2019-06-04 MED FILL — ISOSORBIDE MONONITRATE ER 1: 120 | 30 days supply | Qty: 60 | Fill #0

## 2019-06-04 MED FILL — ANASTROZOLE 1 MG TABLET: 1 | 90 days supply | Qty: 90 | Fill #1

## 2019-06-05 ENCOUNTER — Ambulatory Visit (INDEPENDENT_AMBULATORY_CARE_PROVIDER_SITE_OTHER): Payer: Medicare Other | Admitting: Internal Medicine

## 2019-06-05 ENCOUNTER — Ambulatory Visit (HOSPITAL_COMMUNITY)
Admission: RE | Admit: 2019-06-05 | Discharge: 2019-06-05 | Disposition: A | Discharge status: Home / Self Care | Payer: Medicare Other | Source: Ambulatory Visit | Attending: Internal Medicine | Admitting: Internal Medicine

## 2019-06-05 ENCOUNTER — Other Ambulatory Visit: Payer: Self-pay

## 2019-06-05 VITALS — BP 152/74 | HR 62 | Temp 97.9°F | Ht 61.0 in | Wt 207.5 lb

## 2019-06-05 DIAGNOSIS — E785 Hyperlipidemia, unspecified: Secondary | ICD-10-CM | POA: Diagnosis not present

## 2019-06-05 DIAGNOSIS — E119 Type 2 diabetes mellitus without complications: Secondary | ICD-10-CM | POA: Insufficient documentation

## 2019-06-05 DIAGNOSIS — C50919 Malignant neoplasm of unspecified site of unspecified female breast: Secondary | ICD-10-CM | POA: Diagnosis not present

## 2019-06-05 DIAGNOSIS — I5032 Chronic diastolic (congestive) heart failure: Secondary | ICD-10-CM | POA: Diagnosis not present

## 2019-06-05 DIAGNOSIS — I2510 Atherosclerotic heart disease of native coronary artery without angina pectoris: Secondary | ICD-10-CM | POA: Diagnosis not present

## 2019-06-05 DIAGNOSIS — D508 Other iron deficiency anemias: Secondary | ICD-10-CM | POA: Diagnosis not present

## 2019-06-05 DIAGNOSIS — I351 Nonrheumatic aortic (valve) insufficiency: Secondary | ICD-10-CM | POA: Diagnosis not present

## 2019-06-05 DIAGNOSIS — R600 Localized edema: Secondary | ICD-10-CM | POA: Diagnosis not present

## 2019-06-05 DIAGNOSIS — K219 Gastro-esophageal reflux disease without esophagitis: Secondary | ICD-10-CM | POA: Diagnosis not present

## 2019-06-05 DIAGNOSIS — I509 Heart failure, unspecified: Secondary | ICD-10-CM | POA: Insufficient documentation

## 2019-06-05 DIAGNOSIS — I313 Pericardial effusion (noninflammatory): Secondary | ICD-10-CM | POA: Insufficient documentation

## 2019-06-05 DIAGNOSIS — I110 Hypertensive heart disease with heart failure: Secondary | ICD-10-CM | POA: Insufficient documentation

## 2019-06-05 MED ORDER — SPIRONOLACTONE 25 MG PO TABS: 50.0000 mg | ORAL_TABLET | Freq: Every day | ORAL | 0 refills | Status: DC

## 2019-06-05 MED FILL — SPIRONOLACTONE 25 MG TABS: 25 | 30 days supply | Qty: 60 | Fill #0

## 2019-06-05 NOTE — Progress Notes (Signed)
CC: acute on chronic diastolic CHF, iron def anemia  HPI:  [name] is a 68 y.o. female with PMH below.  Today we will address acute on chronic diastolic CHF, iron def anemia  Please see A&P for status of the patient's chronic medical conditions  Past Medical History:  Diagnosis Date  . Arthritis    "knees, lower back" (08/18/2017)  . 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 S/p lumpectomy and radiation.  Marland Kitchen CAD (coronary artery disease) 11/07   cath 11/11 :  3V CADsee report   . Cataract    small both eyes  . Chronic lower back pain   . Chronic pain    MR 08/07/10 :  Spondylosis L4-5 with B foraminal narrowing and L4 nerve root enchroachment B.  . Chronic venous insufficiency 2012   Has had full W/U incl ECHO, LFT's, Cr, Umicro, TSH, BNP. Symptomatic treatment.  . Colonic polyp 1995   Colonoscopies 1994, 2000, and 02/02/2011. Last had 9 polyps - Tubular adenoma and tubulovillous was the pathology results without high grade dysplasia  . Diabetes mellitus type II    Insulin dependent. Has worked with [name] R previously.  . Diastolic CHF, chronic ([place])    NL EF by echo 01/2012, Gr I dd  . Essential hypertension    Requires 5 drug therapy and still difficult to control.  Marland Kitchen GERD (gastroesophageal reflux disease)   . Heart murmur   . History of radiation therapy 10/02/18- 11/09/18   Left chest wall SCV, Left chest wall boost, Right chest wall SCV, right chest boost all received 28 fractions for a total dose of 50.04 Gy  . Hyperlipidemia    On daily statin  . Iron deficiency anemia    Ferritin was 12 in 2012. on FeSo4. Has had colon and EGD 02/02/11 that showed mild gastritis and colon polyps.  . Obesity    Morbid. HAs worked with [name] T previously.  . OSA (obstructive sleep apnea) 02/16/2007   02/2007 : Moderate AHI 35.6, O2 sat decreased to 65%, CPAP titration to 16 with AHI of 3.6. 02/2014 : Rare respiratory events with  sleep disturbance, within normal limits. AHI 0.4 per hour      . OSA on CPAP   . Personal history of chemotherapy   . Personal history of radiation therapy    "to my left breast"  . Refusal of blood transfusions as patient is Jehovah's Witness   . Seasonal allergies    "grass pollen"   Review of Systems:  ROS: Pulmonary: pt denies increased work of breathing, but does endorse dyspnea on exertion, PND Cardiac: pt denies palpitations, chest pain,  Abdominal: pt denies abdominal pain, nausea, vomiting, or diarrhea   Physical Exam:  Vitals:   06/05/19 1553  BP: (!) 152/74  Pulse: 62  Temp: 97.9 F (36.6 C)  TempSrc: Oral  SpO2: 100%  Weight: 207 lb 8 oz (94.1 kg)  Height: 5\' 1"  (1.549 m)   Cardiac: JVD elevated, distant heart sounds but no obvious murmur or gallop, 1+ LE edema bilaterally with compression stockings Pulmonary: decreased bibasilar breath sounds upper lung fields clear, not in distress Abdominal: soft and nontender Psych: Alert, conversant   Social History   Socioeconomic History  . Marital status: Legally Separated    Spouse name: Not on file  . Number of children: Not on file  . Years of education: 76  . Highest education level: Not on  file  Occupational History  . Not on file  Social Needs  . Financial resource strain: Not on file  . Food insecurity    Worry: Not on file    Inability: Not on file  . Transportation needs    Medical: Yes    Non-medical: No  Tobacco Use  . Smoking status: Never Smoker  . Smokeless tobacco: Never Used  Substance and Sexual Activity  . Alcohol use: No  . Drug use: No  . Sexual activity: Not Currently  Lifestyle  . Physical activity    Days per week: Not on file    Minutes per session: Not on file  . Stress: Not at all  Relationships  . Social Herbalist on phone: Not on file    Gets together: Not on file    Attends religious service: Not on file    Active member of club or organization: Not on  file    Attends meetings of clubs or organizations: Not on file    Relationship status: Not on file  . Intimate partner violence    Fear of current or ex partner: No    Emotionally abused: No    Physically abused: No    Forced sexual activity: No  Other Topics Concern  . Not on file  Social History Narrative   Worked at [employer] part-time from [phone], 2009-2011. No longer working 2014-01-17.Associates degree in early childhood education. Single, lives by self.    Family History  Problem Relation Age of Onset  . Lung cancer Mother   . Diabetes Father   . Breast cancer Sister   . Pulmonary embolism Brother   . Depression Daughter 63  . Osteoarthritis Maternal Grandmother   . Heart attack Brother 55  . Kidney failure Brother        died 2016/01/18 2/2 kidney and heart failure  . Colon cancer Neg Hx   . Colon polyps Neg Hx   . Esophageal cancer Neg Hx   . Rectal cancer Neg Hx   . Stomach cancer Neg Hx     Assessment & Plan:   See Encounters Tab for problem based charting.  Patient discussed with Dr. Philipp Ovens

## 2019-06-05 NOTE — Patient Instructions (Signed)
[name] we have increased your spironolactone to 50mg  daily.  We will also check some blood work to check your renal function and electrolytes including magnesium.  I will also check your iron studies today. Please keep weighing yourself and return in one week.

## 2019-06-05 NOTE — Progress Notes (Signed)
  Echocardiogram 2D Echocardiogram has been performed.  [name] 06/05/2019, 3:37 PM

## 2019-06-06 ENCOUNTER — Other Ambulatory Visit: Payer: Self-pay

## 2019-06-06 ENCOUNTER — Telehealth: Payer: Self-pay | Admitting: *Deleted

## 2019-06-06 LAB — BMP8+ANION GAP
Anion Gap: 15 mmol/L (ref 10.0–18.0)
BUN/Creatinine Ratio: 23 (ref 12–28)
BUN: 27 mg/dL (ref 8–27)
CO2: 22 mmol/L (ref 20–29)
Calcium: 10 mg/dL (ref 8.7–10.3)
Chloride: 104 mmol/L (ref 96–106)
Creatinine, Ser: 1.16 mg/dL — ABNORMAL HIGH (ref 0.57–1.00)
GFR calc Af Amer: 56 mL/min/{1.73_m2} — ABNORMAL LOW (ref >59)
GFR calc non Af Amer: 48 mL/min/{1.73_m2} — ABNORMAL LOW (ref >59)
Glucose: 89 mg/dL (ref 65–99)
Potassium: 4.1 mmol/L (ref 3.5–5.2)
Sodium: 141 mmol/L (ref 134–144)

## 2019-06-06 LAB — MAGNESIUM: Magnesium: 2 mg/dL (ref 1.6–2.3)

## 2019-06-06 LAB — FERRITIN: Ferritin: 118 ng/mL (ref 15–150)

## 2019-06-06 LAB — IRON AND TIBC
Iron Saturation: 17 % (ref 15–55)
Iron: 54 ug/dL (ref 27–139)
Total Iron Binding Capacity: 313 ug/dL (ref 250–450)
UIBC: 259 ug/dL (ref 118–369)

## 2019-06-06 MED FILL — LOSARTAN POTASSIUM 50 MG TA: 50 | 30 days supply | Qty: 30 | Fill #3

## 2019-06-06 NOTE — Progress Notes (Signed)
Internal Medicine Clinic Attending  Case discussed with Dr. Winfrey  at the time of the visit.  We reviewed the resident's history and exam and pertinent patient test results.  I agree with the assessment, diagnosis, and plan of care documented in the resident's note.  

## 2019-06-06 NOTE — Assessment & Plan Note (Signed)
Pt with history of IDA, has some recent cramping in legs thought to be related to lasix.  Not currently anemic, however if iron is low could improve cramping and possibly dyspnea as well.    -check iron studies

## 2019-06-06 NOTE — Telephone Encounter (Signed)
Pt calls and states her breathing is worse, states EMS came out last night and told her she needed to start breathing treatments. She states they kept telling her she just needed a breathing treatment and she probably had covid. She states maybe she needs an inhaler Please advise

## 2019-06-06 NOTE — Assessment & Plan Note (Addendum)
She has cut back on her lasix due to cramping over the weekend.  This morning weighs 205.5 this morning at home scale.  Had been 200lbs for 2 months at that time breathing well.  Day before yesterday weight was 207 but was taking 40mg  BID over the weekend. By our scales she is also down about 3lbs. She has dropped down to 20mg  BID of lasix.  Shortness of breath comes and goes, was wheezing last night, got back on cpap which improved her breathing described as episode of PND.  Has some intense muscle spasms which she attributes to the lasix.  On physical exam she remains volume overloaded, I discussed these findings with her but she is very resistant to more diuretic.  I tried several approaches possibly every other day increases, versus one extra pill just in the morning but she was very resistant does not want more lasix.  At this point I discussed increasing spironolactone she has plenty of bp left for this.  She was more agreeable to this, and I told her we would need to monitor her potassium closely.  Repeat ECHO not yet resulted, viewed images myself quickly and did not notice findings that would change my management.    -increased spiro to 50mg  daily -repeat labs today and add mag and iron studies -could also consider adding farxiga in the future if potassium will tolerate -one week follow up

## 2019-06-10 ENCOUNTER — Other Ambulatory Visit: Payer: Self-pay

## 2019-06-10 ENCOUNTER — Emergency Department (HOSPITAL_COMMUNITY): Payer: Medicare Other

## 2019-06-10 ENCOUNTER — Other Ambulatory Visit (HOSPITAL_COMMUNITY): Payer: Self-pay

## 2019-06-10 ENCOUNTER — Encounter (HOSPITAL_COMMUNITY): Payer: Self-pay

## 2019-06-10 ENCOUNTER — Inpatient Hospital Stay (HOSPITAL_COMMUNITY)
Admission: EM | Admit: 2019-06-10 | Discharge: 2019-06-14 | DRG: 247 | Disposition: A | Discharge status: Home Health | Payer: Medicare Other | Attending: Student in an Organized Health Care Education/Training Program | Admitting: Student in an Organized Health Care Education/Training Program

## 2019-06-10 DIAGNOSIS — M25552 Pain in left hip: Secondary | ICD-10-CM | POA: Diagnosis not present

## 2019-06-10 DIAGNOSIS — I5042 Chronic combined systolic (congestive) and diastolic (congestive) heart failure: Secondary | ICD-10-CM | POA: Diagnosis present

## 2019-06-10 DIAGNOSIS — M7061 Trochanteric bursitis, right hip: Secondary | ICD-10-CM | POA: Diagnosis present

## 2019-06-10 DIAGNOSIS — Z841 Family history of disorders of kidney and ureter: Secondary | ICD-10-CM

## 2019-06-10 DIAGNOSIS — Z9013 Acquired absence of bilateral breasts and nipples: Secondary | ICD-10-CM | POA: Diagnosis not present

## 2019-06-10 DIAGNOSIS — D649 Anemia, unspecified: Secondary | ICD-10-CM | POA: Diagnosis present

## 2019-06-10 DIAGNOSIS — Z803 Family history of malignant neoplasm of breast: Secondary | ICD-10-CM

## 2019-06-10 DIAGNOSIS — Z885 Allergy status to narcotic agent status: Secondary | ICD-10-CM

## 2019-06-10 DIAGNOSIS — I110 Hypertensive heart disease with heart failure: Secondary | ICD-10-CM | POA: Diagnosis present

## 2019-06-10 DIAGNOSIS — K219 Gastro-esophageal reflux disease without esophagitis: Secondary | ICD-10-CM | POA: Diagnosis present

## 2019-06-10 DIAGNOSIS — I25110 Atherosclerotic heart disease of native coronary artery with unstable angina pectoris: Secondary | ICD-10-CM | POA: Diagnosis present

## 2019-06-10 DIAGNOSIS — E119 Type 2 diabetes mellitus without complications: Secondary | ICD-10-CM | POA: Diagnosis not present

## 2019-06-10 DIAGNOSIS — Z8719 Personal history of other diseases of the digestive system: Secondary | ICD-10-CM

## 2019-06-10 DIAGNOSIS — R1110 Vomiting, unspecified: Secondary | ICD-10-CM | POA: Diagnosis not present

## 2019-06-10 DIAGNOSIS — K30 Functional dyspepsia: Secondary | ICD-10-CM | POA: Diagnosis present

## 2019-06-10 DIAGNOSIS — Z6838 Body mass index (BMI) 38.0-38.9, adult: Secondary | ICD-10-CM

## 2019-06-10 DIAGNOSIS — Z801 Family history of malignant neoplasm of trachea, bronchus and lung: Secondary | ICD-10-CM

## 2019-06-10 DIAGNOSIS — Z90710 Acquired absence of both cervix and uterus: Secondary | ICD-10-CM | POA: Diagnosis not present

## 2019-06-10 DIAGNOSIS — E11319 Type 2 diabetes mellitus with unspecified diabetic retinopathy without macular edema: Secondary | ICD-10-CM | POA: Diagnosis present

## 2019-06-10 DIAGNOSIS — I340 Nonrheumatic mitral (valve) insufficiency: Secondary | ICD-10-CM | POA: Diagnosis not present

## 2019-06-10 DIAGNOSIS — K760 Fatty (change of) liver, not elsewhere classified: Secondary | ICD-10-CM | POA: Diagnosis not present

## 2019-06-10 DIAGNOSIS — E785 Hyperlipidemia, unspecified: Secondary | ICD-10-CM | POA: Diagnosis present

## 2019-06-10 DIAGNOSIS — I2510 Atherosclerotic heart disease of native coronary artery without angina pectoris: Secondary | ICD-10-CM | POA: Diagnosis not present

## 2019-06-10 DIAGNOSIS — Z791 Long term (current) use of non-steroidal anti-inflammatories (NSAID): Secondary | ICD-10-CM

## 2019-06-10 DIAGNOSIS — K7589 Other specified inflammatory liver diseases: Secondary | ICD-10-CM | POA: Diagnosis present

## 2019-06-10 DIAGNOSIS — Z8249 Family history of ischemic heart disease and other diseases of the circulatory system: Secondary | ICD-10-CM

## 2019-06-10 DIAGNOSIS — Z923 Personal history of irradiation: Secondary | ICD-10-CM

## 2019-06-10 DIAGNOSIS — Z9221 Personal history of antineoplastic chemotherapy: Secondary | ICD-10-CM

## 2019-06-10 DIAGNOSIS — I313 Pericardial effusion (noninflammatory): Secondary | ICD-10-CM | POA: Diagnosis present

## 2019-06-10 DIAGNOSIS — Z20828 Contact with and (suspected) exposure to other viral communicable diseases: Secondary | ICD-10-CM | POA: Diagnosis present

## 2019-06-10 DIAGNOSIS — E1165 Type 2 diabetes mellitus with hyperglycemia: Secondary | ICD-10-CM | POA: Diagnosis not present

## 2019-06-10 DIAGNOSIS — Z7982 Long term (current) use of aspirin: Secondary | ICD-10-CM

## 2019-06-10 DIAGNOSIS — Z818 Family history of other mental and behavioral disorders: Secondary | ICD-10-CM

## 2019-06-10 DIAGNOSIS — Z833 Family history of diabetes mellitus: Secondary | ICD-10-CM

## 2019-06-10 DIAGNOSIS — I514 Myocarditis, unspecified: Secondary | ICD-10-CM | POA: Insufficient documentation

## 2019-06-10 DIAGNOSIS — Z888 Allergy status to other drugs, medicaments and biological substances status: Secondary | ICD-10-CM

## 2019-06-10 DIAGNOSIS — I5032 Chronic diastolic (congestive) heart failure: Secondary | ICD-10-CM | POA: Diagnosis present

## 2019-06-10 DIAGNOSIS — I214 Non-ST elevation (NSTEMI) myocardial infarction: Secondary | ICD-10-CM | POA: Diagnosis not present

## 2019-06-10 DIAGNOSIS — R1111 Vomiting without nausea: Secondary | ICD-10-CM | POA: Diagnosis not present

## 2019-06-10 DIAGNOSIS — Z79891 Long term (current) use of opiate analgesic: Secondary | ICD-10-CM

## 2019-06-10 DIAGNOSIS — M1612 Unilateral primary osteoarthritis, left hip: Secondary | ICD-10-CM | POA: Diagnosis present

## 2019-06-10 DIAGNOSIS — R7401 Elevation of levels of liver transaminase levels: Secondary | ICD-10-CM

## 2019-06-10 DIAGNOSIS — Z9861 Coronary angioplasty status: Secondary | ICD-10-CM | POA: Diagnosis not present

## 2019-06-10 DIAGNOSIS — G8929 Other chronic pain: Secondary | ICD-10-CM | POA: Diagnosis not present

## 2019-06-10 DIAGNOSIS — R52 Pain, unspecified: Secondary | ICD-10-CM | POA: Diagnosis not present

## 2019-06-10 DIAGNOSIS — Z955 Presence of coronary angioplasty implant and graft: Secondary | ICD-10-CM

## 2019-06-10 DIAGNOSIS — Z531 Procedure and treatment not carried out because of patient's decision for reasons of belief and group pressure: Secondary | ICD-10-CM | POA: Diagnosis present

## 2019-06-10 DIAGNOSIS — E669 Obesity, unspecified: Secondary | ICD-10-CM | POA: Diagnosis not present

## 2019-06-10 DIAGNOSIS — R531 Weakness: Secondary | ICD-10-CM

## 2019-06-10 DIAGNOSIS — Z853 Personal history of malignant neoplasm of breast: Secondary | ICD-10-CM

## 2019-06-10 DIAGNOSIS — R740 Nonspecific elevation of levels of transaminase and lactic acid dehydrogenase [LDH]: Secondary | ICD-10-CM | POA: Diagnosis not present

## 2019-06-10 DIAGNOSIS — IMO0002 Reserved for concepts with insufficient information to code with codable children: Secondary | ICD-10-CM | POA: Diagnosis present

## 2019-06-10 DIAGNOSIS — I371 Nonrheumatic pulmonary valve insufficiency: Secondary | ICD-10-CM | POA: Diagnosis not present

## 2019-06-10 DIAGNOSIS — R509 Fever, unspecified: Secondary | ICD-10-CM | POA: Diagnosis not present

## 2019-06-10 DIAGNOSIS — G4733 Obstructive sleep apnea (adult) (pediatric): Secondary | ICD-10-CM | POA: Diagnosis present

## 2019-06-10 DIAGNOSIS — Z79899 Other long term (current) drug therapy: Secondary | ICD-10-CM

## 2019-06-10 DIAGNOSIS — Z794 Long term (current) use of insulin: Secondary | ICD-10-CM

## 2019-06-10 LAB — CBC WITH DIFFERENTIAL/PLATELET
Abs Immature Granulocytes: 0.04 10*3/uL (ref 0.00–0.07)
Basophils Absolute: 0 10*3/uL (ref 0.0–0.1)
Basophils Relative: 0 %
Eosinophils Absolute: 0 10*3/uL (ref 0.0–0.5)
Eosinophils Relative: 0 %
HCT: 39.1 % (ref 36.0–46.0)
Hemoglobin: 12.3 g/dL (ref 12.0–15.0)
Immature Granulocytes: 1 %
Lymphocytes Relative: 4 %
Lymphs Abs: 0.3 10*3/uL — ABNORMAL LOW (ref 0.7–4.0)
MCH: 29.9 pg (ref 26.0–34.0)
MCHC: 31.5 g/dL (ref 30.0–36.0)
MCV: 94.9 fL (ref 80.0–100.0)
Monocytes Absolute: 0.4 10*3/uL (ref 0.1–1.0)
Monocytes Relative: 5 %
Neutro Abs: 7.4 10*3/uL (ref 1.7–7.7)
Neutrophils Relative %: 90 %
Platelets: 177 10*3/uL (ref 150–400)
RBC: 4.12 MIL/uL (ref 3.87–5.11)
RDW: 14.8 % (ref 11.5–15.5)
WBC: 8.1 10*3/uL (ref 4.0–10.5)
nRBC: 0 % (ref 0.0–0.2)

## 2019-06-10 LAB — COMPREHENSIVE METABOLIC PANEL
ALT: 171 U/L — ABNORMAL HIGH (ref 0–44)
AST: 299 U/L — ABNORMAL HIGH (ref 15–41)
Albumin: 4.3 g/dL (ref 3.5–5.0)
Alkaline Phosphatase: 105 U/L (ref 38–126)
Anion gap: 14 (ref 5–15)
BUN: 38 mg/dL — ABNORMAL HIGH (ref 8–23)
CO2: 20 mmol/L — ABNORMAL LOW (ref 22–32)
Calcium: 10 mg/dL (ref 8.9–10.3)
Chloride: 102 mmol/L (ref 98–111)
Creatinine, Ser: 1.04 mg/dL — ABNORMAL HIGH (ref 0.44–1.00)
GFR calc Af Amer: >60 mL/min (ref >60)
GFR calc non Af Amer: 55 mL/min — ABNORMAL LOW (ref >60)
Glucose, Bld: 193 mg/dL — ABNORMAL HIGH (ref 70–99)
Potassium: 4.9 mmol/L (ref 3.5–5.1)
Sodium: 136 mmol/L (ref 135–145)
Total Bilirubin: 1.8 mg/dL — ABNORMAL HIGH (ref 0.3–1.2)
Total Protein: 7.6 g/dL (ref 6.5–8.1)

## 2019-06-10 LAB — URINALYSIS, ROUTINE W REFLEX MICROSCOPIC
Bilirubin Urine: NEGATIVE
Glucose, UA: NEGATIVE mg/dL
Hgb urine dipstick: NEGATIVE
Ketones, ur: NEGATIVE mg/dL
Leukocytes,Ua: NEGATIVE
Nitrite: NEGATIVE
Protein, ur: NEGATIVE mg/dL
Specific Gravity, Urine: 1.023 (ref 1.005–1.030)
pH: 5 (ref 5.0–8.0)

## 2019-06-10 LAB — GLUCOSE, CAPILLARY: Glucose-Capillary: 171 mg/dL — ABNORMAL HIGH (ref 70–99)

## 2019-06-10 LAB — LACTIC ACID, PLASMA
Lactic Acid, Venous: 2.7 mmol/L (ref 0.5–1.9)
Lactic Acid, Venous: 2.2 mmol/L (ref 0.5–1.9)

## 2019-06-10 LAB — PROTIME-INR
INR: 1.1 (ref 0.8–1.2)
Prothrombin Time: 13.7 seconds (ref 11.4–15.2)

## 2019-06-10 LAB — SARS CORONAVIRUS 2 BY RT PCR (HOSPITAL ORDER, PERFORMED IN ~~LOC~~ HOSPITAL LAB): SARS Coronavirus 2: NEGATIVE

## 2019-06-10 LAB — APTT: aPTT: 25 seconds (ref 24–36)

## 2019-06-10 LAB — LIPASE, BLOOD: Lipase: 30 U/L (ref 11–51)

## 2019-06-10 LAB — BRAIN NATRIURETIC PEPTIDE
B Natriuretic Peptide: 86.1 pg/mL (ref 0.0–100.0)
B Natriuretic Peptide: 250.1 pg/mL — ABNORMAL HIGH (ref 0.0–100.0)

## 2019-06-10 LAB — TROPONIN I (HIGH SENSITIVITY)
Troponin I (High Sensitivity): 86 ng/L — ABNORMAL HIGH (ref <18)
Troponin I (High Sensitivity): 802 ng/L (ref <18)

## 2019-06-10 LAB — PHOSPHORUS: Phosphorus: 4.4 mg/dL (ref 2.5–4.6)

## 2019-06-10 LAB — MAGNESIUM: Magnesium: 1.8 mg/dL (ref 1.7–2.4)

## 2019-06-10 LAB — TSH: TSH: 0.631 u[IU]/mL (ref 0.350–4.500)

## 2019-06-10 MED ORDER — INSULIN ASPART 100 UNIT/ML ~~LOC~~ SOLN
0.0000 [IU] | Freq: Three times a day (TID) | SUBCUTANEOUS | Status: DC
  Administered 2019-06-11: 3 [IU] via SUBCUTANEOUS
  Administered 2019-06-11: 06:00:00 5 [IU] via SUBCUTANEOUS
  Administered 2019-06-11 – 2019-06-12 (×3): 3 [IU] via SUBCUTANEOUS
  Administered 2019-06-12: 5 [IU] via SUBCUTANEOUS
  Administered 2019-06-13 – 2019-06-14 (×2): 3 [IU] via SUBCUTANEOUS
  Administered 2019-06-14 (×2): 2 [IU] via SUBCUTANEOUS

## 2019-06-10 MED ORDER — ASPIRIN 300 MG RE SUPP: 300.0000 mg | RECTAL | Status: DC

## 2019-06-10 MED ORDER — LOSARTAN POTASSIUM 50 MG PO TABS: 50.0000 mg | ORAL_TABLET | Freq: Every day | ORAL | Status: DC

## 2019-06-10 MED ORDER — SODIUM CHLORIDE 0.9 % IV SOLN
2.0000 g | Freq: Once | INTRAVENOUS | Status: AC
  Administered 2019-06-10: 21:00:00 2 g via INTRAVENOUS
  Filled 2019-06-10: qty 2

## 2019-06-10 MED ORDER — MORPHINE SULFATE (PF) 4 MG/ML IV SOLN
4.0000 mg | Freq: Once | INTRAVENOUS | Status: AC
  Administered 2019-06-10: 4 mg via INTRAVENOUS
  Filled 2019-06-10: qty 1

## 2019-06-10 MED ORDER — NITROGLYCERIN 0.4 MG SL SUBL
0.4000 mg | SUBLINGUAL_TABLET | SUBLINGUAL | Status: DC | PRN
  Administered 2019-06-10 (×2): 0.4 mg via SUBLINGUAL
  Filled 2019-06-10 (×2): qty 1

## 2019-06-10 MED ORDER — ORAL CARE MOUTH RINSE
15.0000 mL | Freq: Two times a day (BID) | OROMUCOSAL | Status: DC
  Administered 2019-06-11 – 2019-06-14 (×4): 15 mL via OROMUCOSAL

## 2019-06-10 MED ORDER — VANCOMYCIN HCL 10 G IV SOLR
2000.0000 mg | Freq: Once | INTRAVENOUS | Status: AC
  Administered 2019-06-10: 15:00:00 2000 mg via INTRAVENOUS
  Filled 2019-06-10: qty 2000

## 2019-06-10 MED ORDER — METOPROLOL SUCCINATE ER 50 MG PO TB24
50.0000 mg | ORAL_TABLET | Freq: Every day | ORAL | Status: DC
  Administered 2019-06-11 – 2019-06-14 (×4): 50 mg via ORAL
  Filled 2019-06-10 (×4): qty 1

## 2019-06-10 MED ORDER — NITROGLYCERIN IN D5W 200-5 MCG/ML-% IV SOLN
0.0000 ug/min | INTRAVENOUS | Status: DC
  Administered 2019-06-10: 5 ug/min via INTRAVENOUS
  Administered 2019-06-12: 25 ug/min via INTRAVENOUS
  Filled 2019-06-10 (×2): qty 250

## 2019-06-10 MED ORDER — ASPIRIN 81 MG PO CHEW
324.0000 mg | CHEWABLE_TABLET | Freq: Once | ORAL | Status: AC
  Administered 2019-06-10: 324 mg via ORAL
  Filled 2019-06-10: qty 4

## 2019-06-10 MED ORDER — INSULIN ASPART 100 UNIT/ML ~~LOC~~ SOLN: 0.0000 [IU] | Freq: Three times a day (TID) | SUBCUTANEOUS | Status: DC

## 2019-06-10 MED ORDER — VANCOMYCIN HCL IN DEXTROSE 1-5 GM/200ML-% IV SOLN: 1000.0000 mg | Freq: Once | INTRAVENOUS | Status: DC

## 2019-06-10 MED ORDER — HEPARIN BOLUS VIA INFUSION
4000.0000 [IU] | Freq: Once | INTRAVENOUS | Status: AC
  Administered 2019-06-10: 21:00:00 4000 [IU] via INTRAVENOUS
  Filled 2019-06-10: qty 4000

## 2019-06-10 MED ORDER — METRONIDAZOLE IN NACL 5-0.79 MG/ML-% IV SOLN
500.0000 mg | Freq: Once | INTRAVENOUS | Status: AC
  Administered 2019-06-10: 15:00:00 500 mg via INTRAVENOUS
  Filled 2019-06-10: qty 100

## 2019-06-10 MED ORDER — CHLORHEXIDINE GLUCONATE CLOTH 2 % EX PADS
6.0000 | MEDICATED_PAD | Freq: Every day | CUTANEOUS | Status: DC
  Administered 2019-06-12 – 2019-06-13 (×2): 6 via TOPICAL

## 2019-06-10 MED ORDER — ONDANSETRON HCL 4 MG/2ML IJ SOLN
4.0000 mg | Freq: Four times a day (QID) | INTRAMUSCULAR | Status: DC | PRN
  Administered 2019-06-10: 4 mg via INTRAVENOUS
  Filled 2019-06-10: qty 2

## 2019-06-10 MED ORDER — AMLODIPINE BESYLATE 10 MG PO TABS: 10.0000 mg | ORAL_TABLET | Freq: Every day | ORAL | Status: DC

## 2019-06-10 MED ORDER — ASPIRIN EC 81 MG PO TBEC
81.0000 mg | DELAYED_RELEASE_TABLET | Freq: Every day | ORAL | Status: DC
  Administered 2019-06-11 – 2019-06-14 (×4): 81 mg via ORAL
  Filled 2019-06-10 (×5): qty 1

## 2019-06-10 MED ORDER — MUPIROCIN 2 % EX OINT
1.0000 "application " | TOPICAL_OINTMENT | Freq: Two times a day (BID) | CUTANEOUS | Status: DC
  Administered 2019-06-11 – 2019-06-14 (×6): 1 via NASAL
  Filled 2019-06-10: qty 22

## 2019-06-10 MED ORDER — ASPIRIN 81 MG PO CHEW: 324.0000 mg | CHEWABLE_TABLET | ORAL | Status: DC

## 2019-06-10 MED ORDER — HEPARIN (PORCINE) 25000 UT/250ML-% IV SOLN
1450.0000 [IU]/h | INTRAVENOUS | Status: DC
  Administered 2019-06-10 – 2019-06-11 (×2): 900 [IU]/h via INTRAVENOUS
  Administered 2019-06-13: 1300 [IU]/h via INTRAVENOUS
  Filled 2019-06-10 (×3): qty 250

## 2019-06-10 NOTE — ED Notes (Signed)
ED TO INPATIENT HANDOFF REPORT  ED Nurse Name and Phone #: [phone]  S Name/Age/Gender [name] 68 y.o. female Room/Bed: 036C/036C  Code Status   Code Status: Full Code  Home/SNF/Other Home Patient oriented to: self, place, time and situation Is this baseline? Yes   Triage Complete: Triage complete  Chief Complaint Weakness  Triage Note Pt c/o generalized weakness and vomitting starting today, L hip pain from when family moved her, had episode of lethargy but no LOC  4 episodes of vomitting today, but no zofran given. Afebrile for [facility]   Allergies Allergies  Allergen Reactions  . Blood-Group Specific Substance Other (See Comments)    NO BLOOD - JEHOVAH'S WITNESS  . Lisinopril Other (See Comments)    Chronic cough secondary to ACE  . Oxycodone-Acetaminophen Hives, Itching and Other (See Comments)    flushing  . Percocet [Oxycodone-Acetaminophen] Hives  . Victoza [Liraglutide] Other (See Comments)    Made her feel "out of it"  . Atorvastatin Other (See Comments)    myalgias    Level of Care/Admitting Diagnosis ED Disposition    ED Disposition Condition Comment   Admit  Hospital Area: [facility] [[id number]]  Level of Care: Progressive [102]  Covid Evaluation: Confirmed COVID Negative  Diagnosis: NSTEMI (non-ST elevated myocardial infarction) [id number]  Admitting Physician: Axel Filler [phone]  Attending Physician: Axel Filler [id number]  Estimated length of stay: past midnight tomorrow  Certification:: I certify this patient will need inpatient services for at least 2 midnights  PT Class (Do Not Modify): Inpatient [101]  PT Acc Code (Do Not Modify): Private [1]       B Medical/Surgery History Past Medical History:  Diagnosis Date  . Arthritis    "knees, lower back" (08/18/2017)  . 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 S/p lumpectomy and radiation.  Marland Kitchen  CAD (coronary artery disease) 11/07   cath 11/11 :  3V CADsee report   . Cataract    small both eyes  . Chronic lower back pain   . Chronic pain    MR 08/07/10 :  Spondylosis L4-5 with B foraminal narrowing and L4 nerve root enchroachment B.  . Chronic venous insufficiency 2012   Has had full W/U incl ECHO, LFT's, Cr, Umicro, TSH, BNP. Symptomatic treatment.  . Colonic polyp 1995   Colonoscopies 1994, 2000, and 02/02/2011. Last had 9 polyps - Tubular adenoma and tubulovillous was the pathology results without high grade dysplasia  . Diabetes mellitus type II    Insulin dependent. Has worked with [name] R previously.  . Diastolic CHF, chronic ([place])    NL EF by echo 01/2012, Gr I dd  . Essential hypertension    Requires 5 drug therapy and still difficult to control.  Marland Kitchen GERD (gastroesophageal reflux disease)   . Heart murmur   . History of radiation therapy 10/02/18- 11/09/18   Left chest wall SCV, Left chest wall boost, Right chest wall SCV, right chest boost all received 28 fractions for a total dose of 50.04 Gy  . Hyperlipidemia    On daily statin  . Iron deficiency anemia    Ferritin was 12 in 2012. on FeSo4. Has had colon and EGD 02/02/11 that showed mild gastritis and colon polyps.  . Obesity    Morbid. HAs worked with [name] T previously.  . OSA (obstructive sleep apnea) 02/16/2007   02/2007 : Moderate AHI 35.6, O2 sat decreased to  65%, CPAP titration to 16 with AHI of 3.6. 02/2014 : Rare respiratory events with sleep disturbance, within normal limits. AHI 0.4 per hour      . OSA on CPAP   . Personal history of chemotherapy   . Personal history of radiation therapy    "to my left breast"  . Refusal of blood transfusions as patient is Jehovah's Witness   . Seasonal allergies    "grass pollen"   Past Surgical History:  Procedure Laterality Date  . BREAST LUMPECTOMY Left 2006  . BREAST LUMPECTOMY W/ NEEDLE LOCALIZATION  2006   34 Chemo RX  . CARDIAC CATHETERIZATION N/A 08/15/2015    Procedure: Left Heart Cath and Coronary Angiography;  Surgeon: Troy Sine, MD;  Location: [facility];  Service: Cardiovascular;  Laterality: N/A;  . COLONOSCOPY W/ BIOPSIES AND POLYPECTOMY  1994, 2000, 2012, 26/018  . IR IMAGING GUIDED PORT INSERTION  04/17/2018  . IR REMOVAL TUN ACCESS W/ PORT W/O FL MOD SED  11/14/2018  . LEFT HEART CATH AND CORONARY ANGIOGRAPHY N/A 08/19/2017   Procedure: LEFT HEART CATH AND CORONARY ANGIOGRAPHY;  Surgeon: Belva Crome, MD;  Location: [facility];  Service: Cardiovascular;  Laterality: N/A;  . MASTECTOMY MODIFIED RADICAL Bilateral 07/20/2018  . MASTECTOMY MODIFIED RADICAL Bilateral 07/20/2018   Procedure: BILATERAL MODIFIED RADICAL MASTECTOMIES;  Surgeon: Jovita Kussmaul, MD;  Location: [facility];  Service: General;  Laterality: Bilateral;  . PORTACATH PLACEMENT N/A 04/14/2018   Procedure: ATTEMPED INSERTION PORT-A-CATH;  Surgeon: Jovita Kussmaul, MD;  Location: [facility];  Service: General;  Laterality: N/A;  . TONSILLECTOMY AND ADENOIDECTOMY  1964  . TOTAL ABDOMINAL HYSTERECTOMY  1985   "I had endometrosis"  . ULTRASOUND GUIDANCE FOR VASCULAR ACCESS  08/19/2017   Procedure: Ultrasound Guidance For Vascular Access;  Surgeon: Belva Crome, MD;  Location: [facility];  Service: Cardiovascular;;     A IV Location/Drains/Wounds Patient Lines/Drains/Airways Status   Active Line/Drains/Airways    Name:   Placement date:   Placement time:   Site:   Days:   Peripheral IV 06/10/19 Right Antecubital   06/10/19    -    Antecubital   less than 1   Peripheral IV 06/10/19 Left Arm   06/10/19    1458    Arm   less than 1   Closed System Drain 1 Right;Lateral Chest Bulb (JP) 19 Fr.   07/20/18    0941    Chest   325   Closed System Drain 2 Right;Medial Chest Bulb (JP) 19 Fr.   07/20/18    0941    Chest   325   Closed System Drain 3 Left;Medial Chest Bulb (JP) 19 Fr.   07/20/18    1116    Chest   325   Closed System Drain 4 Left;Lateral Chest Bulb  (JP) 19 Fr.   07/20/18    1117    Chest   325   Incision (Closed) 08/19/17 Arm Right   08/19/17    1441     660   Incision (Closed) 04/17/18 Neck Right   04/17/18    1246     419   Incision (Closed) 07/20/18 Breast Right   07/20/18    1002     325   Incision (Closed) 07/20/18 Breast Left   07/20/18    1120     325   Incision (Closed) 11/14/18 Chest Right   11/14/18    1306  208          Intake/Output Last 24 hours No intake or output data in the 24 hours ending 06/10/19 2113  Labs/Imaging Results for orders placed or performed during the hospital encounter of 06/10/19 (from the past 48 hour(s))  Lactic acid, plasma     Status: Abnormal   Collection Time: 06/10/19  2:50 PM  Result Value Ref Range   Lactic Acid, Venous 2.7 (HH) 0.5 - 1.9 mmol/L    Comment: CRITICAL RESULT CALLED TO, READ BACK BY AND VERIFIED WITH: Mallie Mussel RN @ 1542 ON 06/10/2019 BY TEMOCHE, H Performed at [facility], [address] [place], [place] [address]   SARS Coronavirus 2 [facility] order, Performed in [facility] hospital lab) Nasopharyngeal Nasopharyngeal Swab     Status: None   Collection Time: 06/10/19  2:50 PM   Specimen: Nasopharyngeal Swab  Result Value Ref Range   SARS Coronavirus 2 NEGATIVE NEGATIVE    Comment: (NOTE) If result is NEGATIVE SARS-CoV-2 target nucleic acids are NOT DETECTED. The SARS-CoV-2 RNA is generally detectable in upper and lower  respiratory specimens during the acute phase of infection. The lowest  concentration of SARS-CoV-2 viral copies this assay can detect is 250  copies / mL. A negative result does not preclude SARS-CoV-2 infection  and should not be used as the sole basis for treatment or other  patient management decisions.  A negative result may occur with  improper specimen collection / handling, submission of specimen other  than nasopharyngeal swab, presence of viral mutation(s) within the  areas targeted by this assay, and inadequate number of  viral copies  (<250 copies / mL). A negative result must be combined with clinical  observations, patient history, and epidemiological information. If result is POSITIVE SARS-CoV-2 target nucleic acids are DETECTED. The SARS-CoV-2 RNA is generally detectable in upper and lower  respiratory specimens dur ing the acute phase of infection.  Positive  results are indicative of active infection with SARS-CoV-2.  Clinical  correlation with patient history and other diagnostic information is  necessary to determine patient infection status.  Positive results do  not rule out bacterial infection or co-infection with other viruses. If result is PRESUMPTIVE POSTIVE SARS-CoV-2 nucleic acids MAY BE PRESENT.   A presumptive positive result was obtained on the submitted specimen  and confirmed on repeat testing.  While 2019 novel coronavirus  (SARS-CoV-2) nucleic acids may be present in the submitted sample  additional confirmatory testing may be necessary for epidemiological  and / or clinical management purposes  to differentiate between  SARS-CoV-2 and other Sarbecovirus currently known to infect humans.  If clinically indicated additional testing with an alternate test  methodology [phone]) is advised. The SARS-CoV-2 RNA is generally  detectable in upper and lower respiratory sp ecimens during the acute  phase of infection. The expected result is Negative. Fact Sheet for Patients:  StrictlyIdeas.no Fact Sheet for Healthcare Providers: BankingDealers.co.za This test is not yet approved or cleared by the [place] FDA and has been authorized for detection and/or diagnosis of SARS-CoV-2 by FDA under an Emergency Use Authorization (EUA).  This EUA will remain in effect (meaning this test can be used) for the duration of the COVID-19 declaration under Section 564(b)(1) of the Act, 21 U.S.C. section 360bbb-3(b)(1), unless the authorization is  terminated or revoked sooner. Performed at [facility], [place] [address], [place], [place] [address]   Comprehensive metabolic panel     Status: Abnormal  Collection Time: 06/10/19  3:15 PM  Result Value Ref Range   Sodium 136 135 - 145 mmol/L   Potassium 4.9 3.5 - 5.1 mmol/L   Chloride 102 98 - 111 mmol/L   CO2 20 (L) 22 - 32 mmol/L   Glucose, Bld 193 (H) 70 - 99 mg/dL   BUN 38 (H) 8 - 23 mg/dL   Creatinine, Ser 1.04 (H) 0.44 - 1.00 mg/dL   Calcium 10.0 8.9 - 10.3 mg/dL   Total Protein 7.6 6.5 - 8.1 g/dL   Albumin 4.3 3.5 - 5.0 g/dL   AST 299 (H) 15 - 41 U/L   ALT 171 (H) 0 - 44 U/L   Alkaline Phosphatase 105 38 - 126 U/L   Total Bilirubin 1.8 (H) 0.3 - 1.2 mg/dL   GFR calc non Af Amer 55 (L) >60 mL/min   GFR calc Af Amer >60 >60 mL/min   Anion gap 14 5 - 15    Comment: Performed at [facility], [address], [place], [place] [address]  CBC WITH DIFFERENTIAL     Status: Abnormal   Collection Time: 06/10/19  3:15 PM  Result Value Ref Range   WBC 8.1 4.0 - 10.5 K/uL   RBC 4.12 3.87 - 5.11 MIL/uL   Hemoglobin 12.3 12.0 - 15.0 g/dL   HCT 39.1 36.0 - 46.0 %   MCV 94.9 80.0 - 100.0 fL   MCH 29.9 26.0 - 34.0 pg   MCHC 31.5 30.0 - 36.0 g/dL   RDW 14.8 11.5 - 15.5 %   Platelets 177 150 - 400 K/uL   nRBC 0.0 0.0 - 0.2 %   Neutrophils Relative % 90 %   Neutro Abs 7.4 1.7 - 7.7 K/uL   Lymphocytes Relative 4 %   Lymphs Abs 0.3 (L) 0.7 - 4.0 K/uL   Monocytes Relative 5 %   Monocytes Absolute 0.4 0.1 - 1.0 K/uL   Eosinophils Relative 0 %   Eosinophils Absolute 0.0 0.0 - 0.5 K/uL   Basophils Relative 0 %   Basophils Absolute 0.0 0.0 - 0.1 K/uL   Immature Granulocytes 1 %   Abs Immature Granulocytes 0.04 0.00 - 0.07 K/uL    Comment: Performed at [facility] [address] [place], [place] [address]  APTT     Status: None   Collection Time: 06/10/19  3:15 PM  Result Value Ref Range   aPTT 25 24 - 36 seconds    Comment: Performed at [facility] [address] [place], [place] [address]  Protime-INR     Status: None   Collection Time: 06/10/19  3:15 PM  Result Value Ref Range   Prothrombin Time 13.7 11.4 - 15.2 seconds   INR 1.1 0.8 - 1.2    Comment: (NOTE) INR goal varies based on device and disease states. Performed at [facility], [place] [address], [place], [place] [address]   Lipase, blood     Status: None   Collection Time: 06/10/19  3:15 PM  Result Value Ref Range   Lipase 30 11 - 51 U/L    Comment: Performed at [facility], [place] [address] [place], [place] [address]  Troponin I (High Sensitivity)     Status: Abnormal   Collection Time: 06/10/19  3:15 PM  Result Value Ref Range   Troponin I (High Sensitivity) 86 (H) <18 ng/L    Comment: (NOTE) Elevated high sensitivity troponin I (hsTnI) values and significant  changes across serial measurements may suggest ACS but many other  chronic and acute conditions are known to elevate hsTnI results.  Refer to the Links section for chest pain algorithms and additional  guidance. Performed at [facility], [place] [address] [place], [place] [address]   Brain natriuretic peptide     Status: None   Collection Time: 06/10/19  3:15 PM  Result Value Ref Range   B Natriuretic Peptide 86.1 0.0 - 100.0 pg/mL    Comment: Performed at [facility] [address] [place], [place] [address]  Troponin I (High Sensitivity)     Status: Abnormal   Collection Time: 06/10/19  7:00 PM  Result Value Ref Range   Troponin I (High Sensitivity) 802 (HH) <18 ng/L    Comment: CRITICAL RESULT CALLED TO, READ BACK BY AND VERIFIED WITH: Tillman Sers RN @ 1951 ON 06/10/2019 BY TEMOCHE, H (NOTE) Elevated high sensitivity troponin I (hsTnI) values and significant  changes across serial measurements may suggest ACS but many other  chronic and acute conditions are known to elevate hsTnI results.  Refer to the Links section for chest pain algorithms and additional   guidance. Performed at [facility], [place] [address] [place], [place] [address]    *Note: Due to a large number of results and/or encounters for the requested time period, some results have not been displayed. A complete set of results can be found in Results Review.   Dg Chest Port 1 View  Result Date: 06/10/2019 CLINICAL DATA:  Generalized weakness and vomiting. EXAM: PORTABLE CHEST 1 VIEW COMPARISON:  May 17, 2018 FINDINGS: Cardiomediastinal silhouette is normal. Mediastinal contours appear intact. Calcific atherosclerotic disease of the aorta. There is no evidence of focal airspace consolidation, pleural effusion or pneumothorax. Osseous structures are without acute abnormality. Postsurgical changes in the bilateral chest wall. IMPRESSION: No active disease. Electronically Signed   By: Fidela Salisbury M.D.   On: 06/10/2019 17:07   Dg Hips Bilat W Or Wo Pelvis 3-4 Views  Result Date: 06/10/2019 CLINICAL DATA:  LEFT hip pain EXAM: DG HIP (WITH OR WITHOUT PELVIS) 3-4V BILAT COMPARISON:  Pelvic radiograph 03/31/2012 FINDINGS: Osseous demineralization. Advanced osteoarthritic changes of both hip joints with joint space narrowing and spur formation, significantly progressive since prior study. SI joints preserved. No acute fracture, dislocation, or bone destruction. IMPRESSION: Advanced osteoarthritic changes of both hips, progressive since 2013. Osseous demineralization. Electronically Signed   By: Lavonia Dana M.D.   On: 06/10/2019 17:54   US Abdomen Limited Ruq  Result Date: 06/10/2019 CLINICAL DATA:  Transaminitis. EXAM: ULTRASOUND ABDOMEN LIMITED RIGHT UPPER QUADRANT COMPARISON:  Chest, abdomen and pelvis CT dated 04/20/2018. FINDINGS: Gallbladder: No gallstones or wall thickening visualized. No sonographic Murphy sign noted by sonographer. Common bile duct: Diameter: 5.0 mm Liver: Diffusely echogenic. Corresponding diffuse low density on the previous CT. Portal vein is patent on color  Doppler imaging with normal direction of blood flow towards the liver. Other: None. IMPRESSION: 1. Diffuse hepatic steatosis. 2. Otherwise, normal examination. Electronically Signed   By: Claudie Revering M.D.   On: 06/10/2019 18:05    Pending Labs Unresulted Labs (From admission, onward)    Start     Ordered   06/11/19 0500  Heparin level (unfractionated)  Daily,   R     06/10/19 2011   06/11/19 0500  CBC  Daily,   R     06/10/19 2011   06/11/19 0500  Comprehensive metabolic panel  Tomorrow morning,   R     06/10/19 2020   06/10/19 2114  Sedimentation rate  Add-on,   AD     06/10/19 2113   06/10/19 2114  C-reactive protein  Add-on,   AD     06/10/19 2113   06/10/19 2035  Phosphorus  Once,   STAT     06/10/19 2035   06/10/19 2018  TSH  Once,   STAT     06/10/19 2020   06/10/19 2018  Magnesium  Once,   STAT     06/10/19 2020   06/10/19 2018  Brain natriuretic peptide  Once,   STAT     06/10/19 2020   06/10/19 2016  HIV antibody (Routine Testing)  Once,   STAT     06/10/19 2020   06/10/19 1431  Lactic acid, plasma  Now then every 2 hours,   STAT     06/10/19 1432   06/10/19 1431  Blood Culture (routine x 2)  BLOOD CULTURE X 2,   STAT     06/10/19 1432   06/10/19 1431  Urinalysis, Routine w reflex microscopic  ONCE - STAT,   STAT     06/10/19 1432   06/10/19 1431  Urine culture  ONCE - STAT,   STAT     06/10/19 1432          Vitals/Pain Today's Vitals   06/10/19 1405 06/10/19 1945 06/10/19 2050 06/10/19 2100  BP: (!) 150/75 125/63 123/65 130/69  Pulse: (!) 103 99 98   Resp: 18 20 17 15   Temp: (!) 102 F (38.9 C)  99.8 F (37.7 C)   TempSrc: Oral  Oral   SpO2: 99% 97% 96%     Isolation Precautions No active isolations  Medications Medications  nitroGLYCERIN (NITROSTAT) SL tablet 0.4 mg (0.4 mg Sublingual Given 06/10/19 2010)  heparin bolus via infusion 4,000 Units (has no administration in time range)  heparin ADULT infusion 100 units/mL (25000 units/247mL sodium  chloride 0.45%) (has no administration in time range)  aspirin EC tablet 81 mg (has no administration in time range)  ondansetron (ZOFRAN) injection 4 mg (has no administration in time range)  insulin aspart (novoLOG) injection 0-9 Units (has no administration in time range)  ceFEPIme (MAXIPIME) 2 g in sodium chloride 0.9 % 100 mL IVPB (0 g Intravenous Stopped 06/10/19 2112)  metroNIDAZOLE (FLAGYL) IVPB 500 mg (0 mg Intravenous Stopped 06/10/19 2022)  vancomycin (VANCOCIN) 2,000 mg in sodium chloride 0.9 % 500 mL IVPB (0 mg Intravenous Stopped 06/10/19 2022)  morphine 4 MG/ML injection 4 mg (4 mg Intravenous Given 06/10/19 1446)  morphine 4 MG/ML injection 4 mg (4 mg Intravenous Given 06/10/19 1717)  aspirin chewable tablet 324 mg (324 mg Oral Given 06/10/19 1840)  morphine 4 MG/ML injection 4 mg (4 mg Intravenous Given 06/10/19 1841)    Mobility walks with person assist Low fall risk   Focused Assessments Cardiac Assessment Handoff:  Cardiac Rhythm: Sinus tachycardia Lab Results  Component Value Date   CKTOTAL 161 12/22/2017   CKMB 1.9 10/10/2011   TROPONINI <0.03 08/15/2015   Lab Results  Component Value Date   DDIMER 0.78 (H) 08/18/2017   Does the Patient currently have chest pain? Yes     R Recommendations: See Admitting Provider Note  Report given to:   Additional Notes:

## 2019-06-10 NOTE — ED Notes (Signed)
Heparin verified by patti rn

## 2019-06-10 NOTE — ED Notes (Signed)
Patient transported to X-ray 

## 2019-06-10 NOTE — ED Provider Notes (Signed)
[facility] EMERGENCY DEPARTMENT Provider Note   CSN: [id number] Arrival date & time: 06/10/19  1348     History   Chief Complaint Chief Complaint  Patient presents with   Weakness    HPI [name] is a 68 y.o. female.     HPI  68 year old female with a history of breast cancer post lumpectomy, chemotherapy and radiation, severe coronary artery disease, chronic pain, diabetes type 2, diastolic congestive heart failure, hypertension, hyperlipidemia who presents with concern for generalized weakness, dyspnea.  Per daughter and patient, she has had shortness of breath over the last month and has been seeing PCP with concern for CHF and they have been increasing her lasix and spironolactone.  She had been walking normally, and today daughter is concerned she over-did-it, doing things around the house, and then suddenly had severe generalized weakness and dyspnea.  Her family had to help her walk back from the bathroom given the severe generalized weakness. When they got back to the bed, she dropped onto the bed and suddenly had worsening hip pain on the left side. Has hx of hip pain but significantly worsened now.  Reported generalized weakness but was unaware of fever until arrival here.  Denies significant cough. Nausea, 4 episodes of emesis. Severe pain in hip when tried to weight bear again.  No urinary symptoms.   Does not answer when asked about diarrhea. Denies abdominal pain.  Has had orthopnea.  On ROS reports chronic chest pain, wears nitro patch, not significantly changed today. Denies rashes, tick bites, known COVID19 contacts.  History from pt limited by severe pain in hip.   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 S/p lumpectomy and radiation.   CAD (coronary artery disease) 11/07   cath 11/11 :  3V CADsee  report    Cataract    small both eyes   Chronic lower back pain    Chronic pain    MR 08/07/10 :  Spondylosis L4-5 with B foraminal narrowing and L4 nerve root enchroachment B.   Chronic venous insufficiency 2012   Has had full W/U incl ECHO, LFT's, Cr, Umicro, TSH, BNP. Symptomatic treatment.   Colonic polyp 1995   Colonoscopies 1994, 2000, and 02/02/2011. Last had 9 polyps - Tubular adenoma and tubulovillous was the pathology results without high grade dysplasia   Diabetes mellitus type II    Insulin dependent. Has worked with [name] R previously.   Diastolic CHF, chronic (HCC)    NL EF by echo 01/2012, Gr I dd   Essential hypertension    Requires 5 drug therapy and still difficult to control.   GERD (gastroesophageal reflux disease)    Heart murmur    History of radiation therapy 10/02/18- 11/09/18   Left chest wall SCV, Left chest wall boost, Right chest wall SCV, right chest boost all received 28 fractions for a total dose of 50.04 Gy   Hyperlipidemia    On daily statin   Iron deficiency anemia    Ferritin was 12 in 2012. on FeSo4. Has had colon and EGD 02/02/11 that showed mild gastritis and colon polyps.   Obesity    Morbid. HAs worked with [name] T previously.   OSA (obstructive sleep apnea) 02/16/2007   02/2007 : Moderate AHI 35.6, O2 sat decreased to 65%, CPAP titration to 16 with  AHI of 3.6. 02/2014 : Rare respiratory events with sleep disturbance, within normal limits. 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   Chronic diastolic heart failure ([place]) 06/15/2018   Port-A-Cath in place 04/25/2018   Hypertensive retinopathy 09/09/2017   Elevated TSH 08/15/2015   Prolonged Q-T interval on ECG 08/14/2015   Diabetic retinopathy ([place]) 03/26/2015   Abnormality of gait 01/06/2013   CTS (carpal tunnel syndrome) 03/09/2012   Urinary frequency 07/14/2011   Chronic venous insufficiency    Routine health maintenance 03/09/2011   CAD (coronary artery disease)    Benign essential HTN    Hyperlipidemia    Colonic polyp    Morbid obesity ([place])    Chronic pain    Malignant neoplasm of lower-inner quadrant of left breast in female, estrogen receptor positive ([place])    Iron deficiency anemia    Seasonal allergies    OSA (obstructive sleep apnea) 02/16/2007   DM type 2, uncontrolled, with retinopathy ([place]) 10/04/2004    Past Surgical History:  Procedure Laterality Date   BREAST LUMPECTOMY Left 2006   BREAST LUMPECTOMY W/ NEEDLE LOCALIZATION  2006   34 Chemo RX   CARDIAC CATHETERIZATION N/A 08/15/2015   Procedure: Left Heart Cath and Coronary Angiography;  Surgeon: Troy Sine, MD;  Location: [facility];  Service: Cardiovascular;  Laterality: N/A;   COLONOSCOPY W/ BIOPSIES AND POLYPECTOMY  1994, 2000, 2012, 26/018   IR IMAGING GUIDED PORT INSERTION  04/17/2018   IR REMOVAL TUN ACCESS W/ PORT W/O FL MOD SED  11/14/2018   LEFT HEART CATH AND CORONARY ANGIOGRAPHY N/A 08/19/2017   Procedure: LEFT HEART CATH AND CORONARY ANGIOGRAPHY;  Surgeon: Belva Crome, MD;  Location: [facility];  Service: Cardiovascular;  Laterality: N/A;   MASTECTOMY MODIFIED RADICAL Bilateral 07/20/2018   MASTECTOMY MODIFIED RADICAL Bilateral 07/20/2018   Procedure: BILATERAL MODIFIED RADICAL MASTECTOMIES;  Surgeon:  Jovita Kussmaul, MD;  Location: [facility];  Service: General;  Laterality: Bilateral;   PORTACATH PLACEMENT N/A 04/14/2018   Procedure: ATTEMPED INSERTION PORT-A-CATH;  Surgeon: Jovita Kussmaul, MD;  Location: [facility];  Service: General;  Laterality: N/A;   [place]   "I had endometrosis"   ULTRASOUND GUIDANCE FOR VASCULAR ACCESS  08/19/2017   Procedure: Ultrasound Guidance For Vascular Access;  Surgeon: Belva Crome, MD;  Location: [facility];  Service: Cardiovascular;;     OB History   No obstetric history on file.      Home Medications    Prior to Admission medications   Medication Sig Start Date End Date Taking? Authorizing Provider  ACCU-CHEK FASTCLIX LANCETS MISC Check blood sugar 3 times a day Patient taking differently: 1 each by Other route 3 (three) times daily.  10/11/17  Bartholomew Crews, MD  ACCU-CHEK GUIDE test strip USE 1 [place] 04/03/19   Bartholomew Crews, MD  amLODipine (NORVASC) 10 MG tablet TAKE ONE (1) TABLET BY MOUTH EVERY DAY 12/13/18   Bartholomew Crews, MD  anastrozole (ARIMIDEX) 1 MG tablet Take 1 tablet (1 mg total) by mouth daily. 12/21/18   Nicholas Lose, MD  aspirin EC 81 MG tablet Take 81 mg by mouth daily.    [provider]  Coenzyme Q10 (CO Q 10) 100 MG CAPS Take 100 mg by mouth daily.     [provider]  diclofenac (FLECTOR) 1.3 % PTCH Place 1 patch onto the skin 2 (two) times daily. 11/30/18   Bartholomew Crews, MD  furosemide (LASIX) 20 MG tablet TAKE 1 TABLET (20 MG TOTAL) BY MOUTH AS NEEDED FOR EDEMA. 01/17/19 01/17/20  Bartholomew Crews, MD  gabapentin (NEURONTIN) 300 MG capsule TAKE ONE CAPSULE BY MOUTH AT BEDTIME 03/19/19   Nicholas Lose, MD  insulin aspart (NOVOLOG FLEXPEN) 100 UNIT/ML FlexPen Inject 6 Units into the skin 3 (three) times daily with meals. 01/18/19   Bartholomew Crews, MD  Insulin Pen Needle 32G X 4 MM MISC USE  FOR INJECTIONS up to 4 times daily. Diagnosis code E11.40, Z79.4 05/04/18   Bartholomew Crews, MD  isosorbide mononitrate (IMDUR) 120 MG 24 hr tablet Take 1 tablet (120 mg total) by mouth 2 (two) times daily. OFFICE VISIT NEEDED 06/04/19   Minus Breeding, MD  losartan (COZAAR) 50 MG tablet TAKE ONE (1) TABLET BY MOUTH EVERY DAY 03/14/19   Bartholomew Crews, MD  meloxicam (MOBIC) 7.5 MG tablet TAKE 1 TABLET BY MOUTH ONCE DAILY AS NEEDED FOR PAIN 03/09/19   Bartholomew Crews, MD  metFORMIN (GLUCOPHAGE) 1000 MG tablet Take 1 tablet (1,000 mg total) by mouth 2 (two) times daily with a meal. 12/13/18   Bartholomew Crews, MD  methocarbamol (ROBAXIN) 750 MG tablet Take 1 tablet (750 mg total) by mouth every 6 (six) hours as needed for muscle spasms. 07/22/18   Autumn Messing III, MD  metoprolol succinate (TOPROL-XL) 100 MG 24 hr tablet TAKE ONE (1) TABLET BY MOUTH EVERY DAY 01/17/19   Bartholomew Crews, MD  nitroGLYCERIN (NITROSTAT) 0.4 MG SL tablet PLACE ONE TABLET UNDER THE TONGUE EVERY 5 MINUTES AS NEEDED FOR CHEST PAIN 01/17/19   Minus Breeding, MD  Olopatadine HCl (PATADAY) 0.2 % SOLN Place 1 drop into both eyes daily as needed (for allergies).     [provider]  omeprazole (PRILOSEC) 40 MG capsule Take 1 capsule (40 mg total) by mouth daily. Patient taking differently: Take 40 mg by mouth daily as needed (for acid reflux).  05/17/18   Tanner, Lyndon Code., PA-C  pyridoxine (B-6) 100 MG tablet Take 100 mg by mouth daily.    [provider]  spironolactone (ALDACTONE) 25 MG tablet Take 2 tablets (50 mg total) by mouth daily. 06/05/19   Katherine Roan, MD  TOUJEO SOLOSTAR 300 UNIT/ML SOPN INJECT 35 UNITS INTO THE SKIN DAILY 04/09/19   Bartholomew Crews, MD  traMADol (ULTRAM) 50 MG tablet Take 1 tablet (50 mg total) by mouth every 12 (twelve) hours as needed. Patient taking differently: Take 50 mg by mouth every 12 (twelve) hours as needed for moderate pain.  04/25/18   Alla Feeling,  NP  vitamin B-12 (CYANOCOBALAMIN) 1000 MCG tablet Take 1,000 mcg by mouth daily.    [provider]  Family History Family History  Problem Relation Age of Onset   Lung cancer Mother    Diabetes Father    Breast cancer Sister    Pulmonary embolism Brother    Depression Daughter 66   Osteoarthritis Maternal Grandmother    Heart attack Brother 14   Kidney failure Brother        died 2016-01-22 2/2 kidney and heart failure   Colon cancer Neg Hx    Colon polyps Neg Hx    Esophageal cancer Neg Hx    Rectal cancer Neg Hx    Stomach cancer Neg Hx     Social History Social History   Tobacco Use   Smoking status: Never Smoker   Smokeless tobacco: Never Used  Substance Use Topics   Alcohol use: No   Drug use: No     Allergies   Blood-group specific substance, Lisinopril, Oxycodone-acetaminophen, Percocet [oxycodone-acetaminophen], Victoza [liraglutide], and Atorvastatin   Review of Systems Review of Systems  Constitutional: Positive for fatigue and fever.  HENT: Negative for sore throat.   Eyes: Negative for visual disturbance.  Respiratory: Positive for shortness of breath. Negative for cough.   Cardiovascular: Negative for chest pain.  Gastrointestinal: Positive for nausea and vomiting. Negative for abdominal pain.  Genitourinary: Negative for difficulty urinating and dysuria.  Musculoskeletal: Negative for back pain and neck pain.  Skin: Negative for rash.  Neurological: Negative for syncope and headaches.     Physical Exam Updated Vital Signs BP 120/65 (BP Location: Left Leg)    Pulse (!) 102    Temp 100 F (37.8 C) (Oral)    Resp 18    Ht 5\' 1"  (1.549 m)    Wt 93.5 kg    LMP 07/12/1984    SpO2 98%    BMI 38.95 kg/m   Physical Exam Vitals signs and nursing note reviewed.  Constitutional:      General: She is not in acute distress.    Appearance: She is well-developed. She is not diaphoretic.  HENT:     Head: Normocephalic and  atraumatic.  Eyes:     Conjunctiva/sclera: Conjunctivae normal.  Neck:     Musculoskeletal: Normal range of motion.  Cardiovascular:     Rate and Rhythm: Regular rhythm. Tachycardia present.     Heart sounds: Normal heart sounds. No murmur. No friction rub. No gallop.   Pulmonary:     Effort: Pulmonary effort is normal. No respiratory distress.     Breath sounds: Normal breath sounds. No wheezing or rales.  Abdominal:     General: There is no distension.     Palpations: Abdomen is soft.     Tenderness: There is no abdominal tenderness. There is no guarding.  Musculoskeletal:        General: Tenderness (severe pain with ROM left hip, mild right hip pain) present.     Right lower leg: Edema present.     Left lower leg: Edema present.  Skin:    General: Skin is warm and dry.     Findings: No erythema or rash.  Neurological:     Mental Status: She is alert and oriented to person, place, and time.      ED Treatments / Results  Labs (all labs ordered are listed, but only abnormal results are displayed) Labs Reviewed  LACTIC ACID, PLASMA - Abnormal; Notable for the following components:      Result Value   Lactic Acid, Venous 2.7 (*)    All other components within normal  limits  LACTIC ACID, PLASMA - Abnormal; Notable for the following components:   Lactic Acid, Venous 2.2 (*)    All other components within normal limits  COMPREHENSIVE METABOLIC PANEL - Abnormal; Notable for the following components:   CO2 20 (*)    Glucose, Bld 193 (*)    BUN 38 (*)    Creatinine, Ser 1.04 (*)    AST 299 (*)    ALT 171 (*)    Total Bilirubin 1.8 (*)    GFR calc non Af Amer 55 (*)    All other components within normal limits  CBC WITH DIFFERENTIAL/PLATELET - Abnormal; Notable for the following components:   Lymphs Abs 0.3 (*)    All other components within normal limits  URINALYSIS, ROUTINE W REFLEX MICROSCOPIC - Abnormal; Notable for the following components:   Color, Urine AMBER (*)      All other components within normal limits  BRAIN NATRIURETIC PEPTIDE - Abnormal; Notable for the following components:   B Natriuretic Peptide 250.1 (*)    All other components within normal limits  COMPREHENSIVE METABOLIC PANEL - Abnormal; Notable for the following components:   CO2 20 (*)    Glucose, Bld 190 (*)    BUN 35 (*)    Creatinine, Ser 1.19 (*)    AST 329 (*)    ALT 271 (*)    Total Bilirubin 2.4 (*)    GFR calc non Af Amer 47 (*)    GFR calc Af Amer 54 (*)    All other components within normal limits  LIPID PANEL - Abnormal; Notable for the following components:   Cholesterol 214 (*)    LDL Cholesterol 141 (*)    All other components within normal limits  GLUCOSE, CAPILLARY - Abnormal; Notable for the following components:   Glucose-Capillary 171 (*)    All other components within normal limits  TROPONIN I (HIGH SENSITIVITY) - Abnormal; Notable for the following components:   Troponin I (High Sensitivity) 86 (*)    All other components within normal limits  TROPONIN I (HIGH SENSITIVITY) - Abnormal; Notable for the following components:   Troponin I (High Sensitivity) 802 (*)    All other components within normal limits  SARS CORONAVIRUS 2 (HOSPITAL ORDER, [facility])  CULTURE, BLOOD (ROUTINE X 2)  CULTURE, BLOOD (ROUTINE X 2)  URINE CULTURE  MRSA PCR SCREENING  APTT  PROTIME-INR  LIPASE, BLOOD  BRAIN NATRIURETIC PEPTIDE  HEPARIN LEVEL (UNFRACTIONATED)  CBC  TSH  MAGNESIUM  PHOSPHORUS  HIV ANTIBODY (ROUTINE TESTING W REFLEX)  SEDIMENTATION RATE  C-REACTIVE PROTEIN  LACTIC ACID, PLASMA  HEPARIN LEVEL (UNFRACTIONATED)  BILIRUBIN, DIRECT    EKG None  Radiology Dg Chest Port 1 View  Result Date: 06/10/2019 CLINICAL DATA:  Generalized weakness and vomiting. EXAM: PORTABLE CHEST 1 VIEW COMPARISON:  May 17, 2018 FINDINGS: Cardiomediastinal silhouette is normal. Mediastinal contours appear intact. Calcific atherosclerotic  disease of the aorta. There is no evidence of focal airspace consolidation, pleural effusion or pneumothorax. Osseous structures are without acute abnormality. Postsurgical changes in the bilateral chest wall. IMPRESSION: No active disease. Electronically Signed   By: Fidela Salisbury M.D.   On: 06/10/2019 17:07   Dg Hips Bilat W Or Wo Pelvis 3-4 Views  Result Date: 06/10/2019 CLINICAL DATA:  LEFT hip pain EXAM: DG HIP (WITH OR WITHOUT PELVIS) 3-4V BILAT COMPARISON:  Pelvic radiograph 03/31/2012 FINDINGS: Osseous demineralization. Advanced osteoarthritic changes of both hip joints with joint space narrowing  and spur formation, significantly progressive since prior study. SI joints preserved. No acute fracture, dislocation, or bone destruction. IMPRESSION: Advanced osteoarthritic changes of both hips, progressive since 2013. Osseous demineralization. Electronically Signed   By: Lavonia Dana M.D.   On: 06/10/2019 17:54   US Abdomen Limited Ruq  Result Date: 06/10/2019 CLINICAL DATA:  Transaminitis. EXAM: ULTRASOUND ABDOMEN LIMITED RIGHT UPPER QUADRANT COMPARISON:  Chest, abdomen and pelvis CT dated 04/20/2018. FINDINGS: Gallbladder: No gallstones or wall thickening visualized. No sonographic Murphy sign noted by sonographer. Common bile duct: Diameter: 5.0 mm Liver: Diffusely echogenic. Corresponding diffuse low density on the previous CT. Portal vein is patent on color Doppler imaging with normal direction of blood flow towards the liver. Other: None. IMPRESSION: 1. Diffuse hepatic steatosis. 2. Otherwise, normal examination. Electronically Signed   By: Claudie Revering M.D.   On: 06/10/2019 18:05    Procedures .Critical Care Performed by: Gareth Morgan, MD Authorized by: Gareth Morgan, MD   Critical care provider statement:    Critical care time (minutes):  90   Critical care was time spent personally by me on the following activities:  Discussions with consultants, evaluation of patient's  response to treatment, examination of patient, ordering and performing treatments and interventions, ordering and review of laboratory studies, ordering and review of radiographic studies, pulse oximetry, re-evaluation of patient's condition, obtaining history from patient or surrogate and review of old charts Angiocath insertion  Date/Time: 06/11/2019 1:31 AM Performed by: Gareth Morgan, MD Authorized by: Gareth Morgan, MD  Consent: Verbal consent obtained. Risks and benefits: risks, benefits and alternatives were discussed Consent given by: patient Time out: Immediately prior to procedure a "time out" was called to verify the correct patient, procedure, equipment, support staff and site/side marked as required. Patient tolerance: patient tolerated the procedure well with no immediate complications    (including critical care time)  Medications Ordered in ED Medications  nitroGLYCERIN (NITROSTAT) SL tablet 0.4 mg (0.4 mg Sublingual Given 06/10/19 2117)  heparin ADULT infusion 100 units/mL (25000 units/222mL sodium chloride 0.45%) (900 Units/hr Intravenous New Bag/Given 06/10/19 2121)  aspirin EC tablet 81 mg (has no administration in time range)  ondansetron (ZOFRAN) injection 4 mg (4 mg Intravenous Given 06/10/19 2126)  amLODipine (NORVASC) tablet 10 mg (has no administration in time range)  losartan (COZAAR) tablet 50 mg (has no administration in time range)  metoprolol succinate (TOPROL-XL) 24 hr tablet 50 mg (has no administration in time range)  nitroGLYCERIN 50 mg in dextrose 5 % 250 mL (0.2 mg/mL) infusion (5 mcg/min Intravenous New Bag/Given 06/10/19 2206)  insulin aspart (novoLOG) injection 0-15 Units (has no administration in time range)  mupirocin ointment (BACTROBAN) 2 % 1 application (1 application Nasal Not Given 06/10/19 2312)  Chlorhexidine Gluconate Cloth 2 % PADS 6 each (has no administration in time range)  MEDLINE mouth rinse (has no administration in time range)  ceFEPIme  (MAXIPIME) 2 g in sodium chloride 0.9 % 100 mL IVPB (0 g Intravenous Stopped 06/10/19 2112)  metroNIDAZOLE (FLAGYL) IVPB 500 mg (0 mg Intravenous Stopped 06/10/19 2022)  vancomycin (VANCOCIN) 2,000 mg in sodium chloride 0.9 % 500 mL IVPB (0 mg Intravenous Stopped 06/10/19 2022)  morphine 4 MG/ML injection 4 mg (4 mg Intravenous Given 06/10/19 1446)  morphine 4 MG/ML injection 4 mg (4 mg Intravenous Given 06/10/19 1717)  aspirin chewable tablet 324 mg (324 mg Oral Given 06/10/19 1840)  morphine 4 MG/ML injection 4 mg (4 mg Intravenous Given 06/10/19 1841)  heparin bolus via infusion  4,000 Units (4,000 Units Intravenous Bolus from Bag 06/10/19 2122)     Initial Impression / Assessment and Plan / ED Course  I have reviewed the triage vital signs and the nursing notes.  Pertinent labs & imaging results that were available during my care of the patient were reviewed by me and considered in my medical decision making (see chart for details).       68 year old female with a history of breast cancer post lumpectomy, chemotherapy and radiation, severe coronary artery disease, chronic pain, diabetes type 2, diastolic congestive heart failure, hypertension, hyperlipidemia who presents with concern for generalized weakness, dyspnea, nausea/vomiting today with severe left hip pain after falling down onto the bed and found to be febrile on arrival to the ED.  Patient with severe acute on chronic hip pain on arrival to the ED.  Given fever to 102, suspected infection likely etiology of generalized weakness.  Blood cx obtained and ordered empiric abx for sepsis of unknown etiology. Delay in UA obtained given hip pain.  XR of hip and chest show no evidence of fracture, pneumonia or pulmonary edema.  Lower suspicion for septic arthritis of the hip given acute worsening after she fell onto the bed, prior chronic pain, was able to ambulate normally this AM.    Transaminitis noted and RUQ [place] ordered to evaluate for cholecystitis as  an etiology of fever but shows no acute abnormalities.   While in the ED, pt with worsening chest pain, noted chronic CP. ECG with ischemic changes but does not meet criteria for STEMI and was discussed with Dr. [place].  She has known triple vessel CAD, last Cath in 2018 with Dr. Tamala Julian and CABG was discussed but patient is a Sales promotion account executive Witness and decided on medical management.  Troponin in ED trended from 80 to 800s.  Gave aspirin, started on heparin gtt and consulted Cardiology who came to bedside.  Patient with complex history at this time with fever of 102 of unclear etiology, generalized weakness, NSTEMI possibly secondary to demand in setting of infection with severe CAD, or separate myocardial ischemia or myocarditis.  COVID19 testing negative.  Admitted to medicine with Cardiology consulting.  Final Clinical Impressions(s) / ED Diagnoses   Final diagnoses:  Transaminitis  NSTEMI (non-ST elevated myocardial infarction) ([place])  Fever, unspecified  Generalized weakness    ED Discharge Orders    None       Gareth Morgan, MD 06/11/19 [phone]

## 2019-06-10 NOTE — ED Notes (Signed)
Attempted to call report, will call back in 5.

## 2019-06-10 NOTE — Progress Notes (Signed)
ANTICOAGULATION CONSULT NOTE - Initial Consult  Pharmacy Consult for heparin Indication: chest pain/ACS   Patient Measurements: Heparin Dosing Weight: 70 kg  Vital Signs: Temp: 102 F (38.9 C) (09/06 1405) Temp Source: Oral (09/06 1405) BP: 150/75 (09/06 1405) Pulse Rate: 103 (09/06 1405)  Labs: Recent Labs    06/10/19 1515 06/10/19 1900  HGB 12.3  --   HCT 39.1  --   PLT 177  --   APTT 25  --   LABPROT 13.7  --   INR 1.1  --   CREATININE 1.04*  --   TROPONINIHS 86* 802*    Estimated Creatinine Clearance: 54.2 mL/min (A) (by C-G formula based on SCr of 1.04 mg/dL (H)).   Medical History: Past Medical History:  Diagnosis Date  . Arthritis    "knees, lower back" (08/18/2017)  . 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 S/p lumpectomy and radiation.  Marland Kitchen CAD (coronary artery disease) 11/07   cath 11/11 :  3V CADsee report   . Cataract    small both eyes  . Chronic lower back pain   . Chronic pain    MR 08/07/10 :  Spondylosis L4-5 with B foraminal narrowing and L4 nerve root enchroachment B.  . Chronic venous insufficiency 2012   Has had full W/U incl ECHO, LFT's, Cr, Umicro, TSH, BNP. Symptomatic treatment.  . Colonic polyp 1995   Colonoscopies 1994, 2000, and 02/02/2011. Last had 9 polyps - Tubular adenoma and tubulovillous was the pathology results without high grade dysplasia  . Diabetes mellitus type II    Insulin dependent. Has worked with [name] R previously.  . Diastolic CHF, chronic ([place])    NL EF by echo 01/2012, Gr I dd  . Essential hypertension    Requires 5 drug therapy and still difficult to control.  Marland Kitchen GERD (gastroesophageal reflux disease)   . Heart murmur   . History of radiation therapy 10/02/18- 11/09/18   Left chest wall SCV, Left chest wall boost, Right chest wall SCV, right chest boost all received 28 fractions for a total dose of 50.04 Gy  . Hyperlipidemia    On daily statin  . Iron deficiency  anemia    Ferritin was 12 in 2012. on FeSo4. Has had colon and EGD 02/02/11 that showed mild gastritis and colon polyps.  . Obesity    Morbid. HAs worked with [name] T previously.  . OSA (obstructive sleep apnea) 02/16/2007   02/2007 : Moderate AHI 35.6, O2 sat decreased to 65%, CPAP titration to 16 with AHI of 3.6. 02/2014 : Rare respiratory events with sleep disturbance, within normal limits. AHI 0.4 per hour      . OSA on CPAP   . Personal history of chemotherapy   . Personal history of radiation therapy    "to my left breast"  . Refusal of blood transfusions as patient is Jehovah's Witness   . Seasonal allergies    "grass pollen"     Assessment: 68 yo female with hx of breast cancer amongst other comorbidities. Planning to start heparin infusion for rule out ACS. Troponin positive. SCr and CBC wnl.    Goal of Therapy:  Heparin level 0.3-0.7 units/ml Monitor platelets by anticoagulation protocol: Yes    Plan:  -Heparin bolus 4000 units x1 then 900 units/hr -Daily HL, CBC -Check level with AM labs   Harvel Quale 06/10/2019,8:07 PM

## 2019-06-10 NOTE — Consult Note (Signed)
Cardiology Consult    Patient ID: [name] MRN: [id number], DOB/AGE: [birth year]   Admit date: 06/10/2019 Date of Consult: 06/10/2019  Primary Physician: Bartholomew Crews, MD Primary Cardiologist: No primary care provider on file. Requesting Provider: Dr. Billy Fischer  Patient Profile    [name] is a 68 y.o. female with a history of three-vessel coronary disease, medically managed, HFpEF, diabetes, hypertension, hyperlipidemia, breast cancer status post chemo and radiation, obesity, who is being seen today for the evaluation of chest pain and elevated troponin at the request of Dr. Billy Fischer.  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 S/p lumpectomy and radiation.   CAD (coronary artery disease) 11/07   cath 11/11 :  3V CADsee report    Cataract    small both eyes   Chronic lower back pain    Chronic pain    MR 08/07/10 :  Spondylosis L4-5 with B foraminal narrowing and L4 nerve root enchroachment B.   Chronic venous insufficiency 2012   Has had full W/U incl ECHO, LFT's, Cr, Umicro, TSH, BNP. Symptomatic treatment.   Colonic polyp 1995   Colonoscopies 1994, 2000, and 02/02/2011. Last had 9 polyps - Tubular adenoma and tubulovillous was the pathology results without high grade dysplasia   Diabetes mellitus type II    Insulin dependent. Has worked with [name] R previously.   Diastolic CHF, chronic ([facility])    NL EF by echo 01/2012, Gr I dd   Essential hypertension    Requires 5 drug therapy and still difficult to control.   GERD (gastroesophageal reflux disease)    Heart murmur    History of radiation therapy 10/02/18- 11/09/18   Left chest wall SCV, Left chest wall boost, Right chest wall SCV, right chest boost all received 28 fractions for a total dose of 50.04 Gy   Hyperlipidemia    On daily  statin   Iron deficiency anemia    Ferritin was 12 in 2012. on FeSo4. Has had colon and EGD 02/02/11 that showed mild gastritis and colon polyps.   Obesity    Morbid. HAs worked with [name] T previously.   OSA (obstructive sleep apnea) 02/16/2007   02/2007 : Moderate AHI 35.6, O2 sat decreased to 65%, CPAP titration to 16 with AHI of 3.6. 02/2014 : Rare respiratory events with sleep disturbance, within normal limits. AHI 0.4 per hour       OSA on CPAP    Personal history of chemotherapy    Personal history of radiation therapy    "to my left breast"   Refusal of blood transfusions as patient is Jehovah's Witness    Seasonal allergies    "grass pollen"    Past Surgical History:  Procedure Laterality Date   BREAST LUMPECTOMY Left 2006   BREAST LUMPECTOMY W/ NEEDLE LOCALIZATION  2006   34 Chemo RX   CARDIAC CATHETERIZATION N/A 08/15/2015   Procedure: Left Heart Cath and Coronary Angiography;  Surgeon: Troy Sine, MD;  Location: [facility];  Service: Cardiovascular;  Laterality: N/A;   COLONOSCOPY W/ BIOPSIES AND POLYPECTOMY  1994, 2000, 2012, 26/018   IR IMAGING GUIDED PORT INSERTION  04/17/2018   IR REMOVAL TUN ACCESS W/ PORT W/O FL MOD SED  11/14/2018   LEFT HEART CATH AND CORONARY ANGIOGRAPHY N/A 08/19/2017  Procedure: LEFT HEART CATH AND CORONARY ANGIOGRAPHY;  Surgeon: Belva Crome, MD;  Location: [facility];  Service: Cardiovascular;  Laterality: N/A;   MASTECTOMY MODIFIED RADICAL Bilateral 07/20/2018   MASTECTOMY MODIFIED RADICAL Bilateral 07/20/2018   Procedure: BILATERAL MODIFIED RADICAL MASTECTOMIES;  Surgeon: Jovita Kussmaul, MD;  Location: [facility];  Service: General;  Laterality: Bilateral;   PORTACATH PLACEMENT N/A 04/14/2018   Procedure: ATTEMPED INSERTION PORT-A-CATH;  Surgeon: Jovita Kussmaul, MD;  Location: [facility];  Service: General;  Laterality: N/A;   [place]   "I had  endometrosis"   ULTRASOUND GUIDANCE FOR VASCULAR ACCESS  08/19/2017   Procedure: Ultrasound Guidance For Vascular Access;  Surgeon: Belva Crome, MD;  Location: [facility];  Service: Cardiovascular;;     Allergies  Allergies  Allergen Reactions   Blood-Group Specific Substance Other (See Comments)    NO BLOOD - JEHOVAH'S WITNESS   Lisinopril Other (See Comments)    Chronic cough secondary to ACE   Oxycodone-Acetaminophen Hives, Itching and Other (See Comments)    flushing   Percocet [Oxycodone-Acetaminophen] Hives   Victoza [Liraglutide] Other (See Comments)    Made her feel "out of it"   Atorvastatin Other (See Comments)    myalgias    History of Present Illness    [name] is a 68 y.o. female with a history of three-vessel coronary disease, medically managed, HFpEF, diabetes, hypertension, hyperlipidemia, breast cancer status post chemo and radiation, obesity, who is being seen today for the evaluation of chest pain and elevated troponin.  The patient is able to give only minimal history as she has recently received several doses of IV morphine.  However, her daughter is with her and able to provide her recent history.  The daughter reports that over the past several weeks the patient has had shortness of breath, volume overload, wheezing, orthopnea and that she was seen by her primary care office on multiple occasions with adjustments to her diuretic.  Her shortness of breath had improved as her weight came down and volume status was improved.  As a part of her work-up, she had a recent echocardiogram on 06/05/2019 which revealed a normal ejection fraction and without significant valvular disease.  The daughter reports that she was checking on her mother today as her mother reported that she had significant fatigue.  Her mother normally lives independently and is able to care for herself but today was so fatigued that she was unable to walk the 10 feet from her bed  to the toilet.  She had to be assisted by her daughter and nephew into her bed.  On further discussion the patient also reports that she has had chest pain for the last several years.  She does believe that this has been worsening recently.  She wears a nitroglycerin patch as needed at home and has been using this daily in the last several weeks.  She is unable to tell me when she began to notice escalation of her symptoms.  Chest pain is described as substernal that radiates to the left arm.  It is not clearly associated with all episodes of recent shortness of breath.  Nonetheless, EMS was called due to her symptoms today and she was brought to the emergency room.  In the ED she was found to be febrile with a temperature of 102.  Cardiac with a heart rate in the low 100s but  normotensive to hypertensive.  She began having recurrent chest pain in the emergency room.She had significant artifact but initial EKG showed sinus rhythm with ST-elevation in aVR and diffuse ST depression.  Repeat EKG showed similar findings.  Initial troponin was 86 with an increase to 802 over nearly 4 hours.  When I am seeing her the patient reports ongoing intermittent chest discomfort.  It has improved to a 5/10 from 10/10 earlier.  Notably believe the patient has a history of coronary disease.  She had a heart catheterization in 01-04-17 which revealed severe three-vessel coronary artery disease with severe proximal LAD, LCx lesions in a left dominant coronary system.  She was referred for consideration of coronary artery bypass grafting at that time.  However, given anemia and a refusal of blood products as she is a Jehovah's Witness, the patient was not felt to be a surgical candidate.  She was managed with medical therapy for coronary artery disease.  Inpatient Medications     [START ON 06/11/2019] aspirin EC  81 mg Oral Daily   heparin  4,000 Units Intravenous Once   [START ON 06/11/2019] insulin aspart  0-9 Units Subcutaneous  TID WC    Family History    Family History  Problem Relation Age of Onset   Lung cancer Mother    Diabetes Father    Breast cancer Sister    Pulmonary embolism Brother    Depression Daughter 57   Osteoarthritis Maternal Grandmother    Heart attack Brother 92   Kidney failure Brother        died January 05, 2016 2/2 kidney and heart failure   Colon cancer Neg Hx    Colon polyps Neg Hx    Esophageal cancer Neg Hx    Rectal cancer Neg Hx    Stomach cancer Neg Hx    She indicated that her mother is deceased. She indicated that her father is deceased. She indicated that her sister is deceased. She indicated that one of her three brothers is deceased. She indicated that her maternal grandmother is deceased. She indicated that her maternal grandfather is deceased. She indicated that her paternal grandmother is deceased. She indicated that her paternal grandfather is deceased. She indicated that the status of her daughter is unknown. She indicated that the status of her neg hx is unknown.   Social History    Social History   Socioeconomic History   Marital status: Legally Separated    Spouse name: Not on file   Number of children: Not on file   Years of education: 14   Highest education level: Not on file  Occupational History   Not on file  Social Needs   Financial resource strain: Not on file   Food insecurity    Worry: Not on file    Inability: Not on file   Transportation needs    Medical: Yes    Non-medical: No  Tobacco Use   Smoking status: Never Smoker   Smokeless tobacco: Never Used  Substance and Sexual Activity   Alcohol use: No   Drug use: No   Sexual activity: Not Currently  Lifestyle   Physical activity    Days per week: Not on file    Minutes per session: Not on file   Stress: Not at all  Relationships   Social connections    Talks on phone: Not on file    Gets together: Not on file    Attends religious service: Not on file  Active member of club or organization: Not on file    Attends meetings of clubs or organizations: Not on file    Relationship status: Not on file   Intimate partner violence    Fear of current or ex partner: No    Emotionally abused: No    Physically abused: No    Forced sexual activity: No  Other Topics Concern   Not on file  Social History Narrative   Worked at [employer] part-time from [phone], 2009-2011. No longer working 2015.Associates degree in early childhood education. Single, lives by self.     Review of Systems    General:  No chills, fever, night sweats or weight changes.  Cardiovascular:  No chest pain, dyspnea on exertion, edema, orthopnea, palpitations, paroxysmal nocturnal dyspnea. Dermatological: No rash, lesions/masses Respiratory: No cough, dyspnea Urologic: No hematuria, dysuria Abdominal:   No nausea, vomiting, diarrhea, bright red blood per rectum, melena, or hematemesis Neurologic:  No visual changes, wkns, changes in mental status. All other systems reviewed and are otherwise negative except as noted above.  Physical Exam    Blood pressure 130/69, pulse 98, temperature 99.8 F (37.7 C), temperature source Oral, resp. rate 15, last menstrual period 07/12/1984, SpO2 96 %.  General: Appears uncomfortable.  Is groggy but arousable Neuro: Alert and oriented X 3. Moves all extremities spontaneously. HEENT: Normal  Neck: Supple without bruits.  No significant JVD at 45 degrees Lungs:  Resp regular and unlabored, CTA on anterior auscultation Heart: Tachycardic.  Regular rhythm.  No audible murmurs, rubs or gallops Abdomen: Soft, non-tender, non-distended, BS + x 4.  Extremities: Remedies are somewhat cool to the touch.  Trace to 1+ pitting edema, equal bilaterally, in the lower extremities.  Labs    Cardiac Enzymes Recent Labs  Lab 06/10/19 1515 06/10/19 1900  TROPONINIHS 86* 802*      Lab Results  Component Value Date   WBC 8.1 06/10/2019    HGB 12.3 06/10/2019   HCT 39.1 06/10/2019   MCV 94.9 06/10/2019   PLT 177 06/10/2019    Recent Labs  Lab 06/10/19 1515  NA 136  K 4.9  CL 102  CO2 20*  BUN 38*  CREATININE 1.04*  CALCIUM 10.0  PROT 7.6  BILITOT 1.8*  ALKPHOS 105  ALT 171*  AST 299*  GLUCOSE 193*   Lab Results  Component Value Date   CHOL 185 11/04/2017   HDL 57 11/04/2017   LDLCALC 112 (H) 11/04/2017   TRIG 78 11/04/2017   Lab Results  Component Value Date   DDIMER 0.78 (H) 08/18/2017     Radiology Studies    Dg Chest Port 1 View  Result Date: 06/10/2019 CLINICAL DATA:  Generalized weakness and vomiting. EXAM: PORTABLE CHEST 1 VIEW COMPARISON:  May 17, 2018 FINDINGS: Cardiomediastinal silhouette is normal. Mediastinal contours appear intact. Calcific atherosclerotic disease of the aorta. There is no evidence of focal airspace consolidation, pleural effusion or pneumothorax. Osseous structures are without acute abnormality. Postsurgical changes in the bilateral chest wall. IMPRESSION: No active disease. Electronically Signed   By: Fidela Salisbury M.D.   On: 06/10/2019 17:07   Dg Hips Bilat W Or Wo Pelvis 3-4 Views  Result Date: 06/10/2019 CLINICAL DATA:  LEFT hip pain EXAM: DG HIP (WITH OR WITHOUT PELVIS) 3-4V BILAT COMPARISON:  Pelvic radiograph 03/31/2012 FINDINGS: Osseous demineralization. Advanced osteoarthritic changes of both hip joints with joint space narrowing and spur formation, significantly progressive since prior study. SI joints preserved. No acute fracture, dislocation,  or bone destruction. IMPRESSION: Advanced osteoarthritic changes of both hips, progressive since 2013. Osseous demineralization. Electronically Signed   By: Lavonia Dana M.D.   On: 06/10/2019 17:54   US Abdomen Limited Ruq  Result Date: 06/10/2019 CLINICAL DATA:  Transaminitis. EXAM: ULTRASOUND ABDOMEN LIMITED RIGHT UPPER QUADRANT COMPARISON:  Chest, abdomen and pelvis CT dated 04/20/2018. FINDINGS: Gallbladder: No  gallstones or wall thickening visualized. No sonographic Murphy sign noted by sonographer. Common bile duct: Diameter: 5.0 mm Liver: Diffusely echogenic. Corresponding diffuse low density on the previous CT. Portal vein is patent on color Doppler imaging with normal direction of blood flow towards the liver. Other: None. IMPRESSION: 1. Diffuse hepatic steatosis. 2. Otherwise, normal examination. Electronically Signed   By: Claudie Revering M.D.   On: 06/10/2019 18:05    ECG & Cardiac Imaging    06/10/2019- personally reviewed.  As above, elevation in aVR with multiple leads with ST depression.  Assessment & Plan    [name] is a 68 y.o. female with a history of three-vessel coronary disease, medically managed, HFpEF, diabetes, hypertension, hyperlipidemia, breast cancer status post chemo and radiation, obesity, who is being seen today for the evaluation of NSTEMI.  The patient has known severe three-vessel coronary artery disease.  It is somewhat unclear at this time whether her elevated troponin represents a type I versus type II event in the setting of an infection.  Per the history given, she is having escalating chest pain so likely does need a repeat heart catheterization during her hospitalization.  Given unclear infectious source at this point, would try to medically manage tonight while further information is obtained.   Dr. Billy Fischer reviewed her EKG on arrival with the interventional cardiologist on-call, Dr. Martinique.  She does not meet STEMI criteria at this point but would have relatively low threshold for coronary angiography. If we are unable to manage her chest pain with medical therapy overnight, will need to rediscuss the potential for a heart catheterization urgently this evening.  - ASA, heparin gtt - If the patient continues to have chest pain can initiate a nitroglycerin infusion.  -TTE in the morning -I have discussed the above with the primary team.  They will continue to  closely monitor the patient's chest pain and notify me if this cannot be controlled with medical management.  Signed, Bryna Colander, MD 06/10/2019, 9:19 PM  For questions or updates, please contact   Please consult [email or web address] for contact info under Cardiology/STEMI.

## 2019-06-10 NOTE — ED Triage Notes (Signed)
Pt c/o generalized weakness and vomitting starting today, L hip pain from when family moved her, had episode of lethargy but no LOC  4 episodes of vomitting today, but no zofran given. Afebrile for [facility]

## 2019-06-10 NOTE — H&P (Addendum)
Date: 06/11/2019               Patient Name:  [name] MRN: [id number]  DOB: 1951-09-24 Age / Sex: 68 y.o., female   PCP: Bartholomew Crews, MD         Medical Service: Internal Medicine Teaching Service         Attending Physician: Dr. Evette Doffing, Mallie Mussel, *    First Contact: Dr. Sheppard Coil Pager: [phone]  Second Contact: Dr. Youlanda Roys Pager: [phone]       After Hours (After 5p/  First Contact Pager: [phone]  weekends / holidays): Second Contact Pager: [phone]   Chief Complaint: Chest pain  History of Present Illness: [name] is a 68 year old with a pertinent past medical history of three-vessel coronary disease, HFpEF, diabetes, hypertension, hyperlipidemia, breast cancer status post chemo and radiation, obesity and presents today with a chief complaint of chest pain.    The patient's daughter states that over the past couple of weeks she has had trouble controlling her heart failure related symptoms and complained of increased shortness of breath and wheezing. She was recently seen at by her primary care physician and her diuretic medications were adjusted, which improved her symptoms. Over the last week the patient has had generalized weakness which progressively got worse today.  Patient states that she was unable to make it to the restroom and back to her bed without assistance.  Patient admits to having some left hip pain due to osteoarthritis which could have contributed to her difficulty with ambulation. Patient was daughter concerned that her heart failure was getting worse and called EMS. Once in the hospital the patient began having substernal chest pain that radiated to the left shoulder. The pain significantly limited her ability to answer questions during the interview.  She states that she has had correction of her anginal symptoms over the last couple weeks and has required more nitroglycerin than usual. Patient was given aspirin, morphine, and nitro  in the ED which helped relieve her pain.  She admits to fever, chest pain, nausea, and shortness of breath. She denies abdominal pain, vomiting, diarrhea, dysuria, recent illness, or sick contacts.   Meds:  No current facility-administered medications on file prior to encounter.    Current Outpatient Medications on File Prior to Encounter  Medication Sig Dispense Refill   ACCU-CHEK FASTCLIX LANCETS MISC Check blood sugar 3 times a day (Patient taking differently: 1 each by Other route 3 (three) times daily. ) 102 each 5   ACCU-CHEK GUIDE test strip USE 1 STRIP THREE TIMES DAILY 375 each 0   amLODipine (NORVASC) 10 MG tablet TAKE ONE (1) TABLET BY MOUTH EVERY DAY 90 tablet 3   anastrozole (ARIMIDEX) 1 MG tablet Take 1 tablet (1 mg total) by mouth daily. 90 tablet 3   aspirin EC 81 MG tablet Take 81 mg by mouth daily.     Coenzyme Q10 (CO Q 10) 100 MG CAPS Take 100 mg by mouth daily.      diclofenac (FLECTOR) 1.3 % PTCH Place 1 patch onto the skin 2 (two) times daily. 30 patch 5   furosemide (LASIX) 20 MG tablet TAKE 1 TABLET (20 MG TOTAL) BY MOUTH AS NEEDED FOR EDEMA. 90 tablet 3   gabapentin (NEURONTIN) 300 MG capsule TAKE ONE CAPSULE BY MOUTH AT BEDTIME 90 capsule 0   insulin aspart (NOVOLOG FLEXPEN) 100 UNIT/ML FlexPen Inject 6 Units into the skin 3 (three) times daily with meals. 15  mL 3   Insulin Pen Needle 32G X 4 MM MISC USE FOR INJECTIONS up to 4 times daily. Diagnosis code E11.40, Z79.4 200 each 5   isosorbide mononitrate (IMDUR) 120 MG 24 hr tablet Take 1 tablet (120 mg total) by mouth 2 (two) times daily. OFFICE VISIT NEEDED 60 tablet 0   losartan (COZAAR) 50 MG tablet TAKE ONE (1) TABLET BY MOUTH EVERY DAY 90 tablet 3   meloxicam (MOBIC) 7.5 MG tablet TAKE 1 TABLET BY MOUTH ONCE DAILY AS NEEDED FOR PAIN 90 tablet 0   metFORMIN (GLUCOPHAGE) 1000 MG tablet Take 1 tablet (1,000 mg total) by mouth 2 (two) times daily with a meal. 180 tablet 3   methocarbamol (ROBAXIN)  750 MG tablet Take 1 tablet (750 mg total) by mouth every 6 (six) hours as needed for muscle spasms. 30 tablet 1   metoprolol succinate (TOPROL-XL) 100 MG 24 hr tablet TAKE ONE (1) TABLET BY MOUTH EVERY DAY 90 tablet 3   nitroGLYCERIN (NITROSTAT) 0.4 MG SL tablet PLACE ONE TABLET UNDER THE TONGUE EVERY 5 MINUTES AS NEEDED FOR CHEST PAIN 25 tablet 7   Olopatadine HCl (PATADAY) 0.2 % SOLN Place 1 drop into both eyes daily as needed (for allergies).      omeprazole (PRILOSEC) 40 MG capsule Take 1 capsule (40 mg total) by mouth daily. (Patient taking differently: Take 40 mg by mouth daily as needed (for acid reflux). ) 30 capsule 5   pyridoxine (B-6) 100 MG tablet Take 100 mg by mouth daily.     spironolactone (ALDACTONE) 25 MG tablet Take 2 tablets (50 mg total) by mouth daily. 60 tablet 0   TOUJEO SOLOSTAR 300 UNIT/ML SOPN INJECT 35 UNITS INTO THE SKIN DAILY 9 mL 11   traMADol (ULTRAM) 50 MG tablet Take 1 tablet (50 mg total) by mouth every 12 (twelve) hours as needed. (Patient taking differently: Take 50 mg by mouth every 12 (twelve) hours as needed for moderate pain. ) 30 tablet 0   vitamin B-12 (CYANOCOBALAMIN) 1000 MCG tablet Take 1,000 mcg by mouth daily.     No outpatient medications have been marked as taking for the 06/10/19 encounter [facility] Encounter).     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  S/p lumpectomy and radiation.   CAD (coronary artery disease) 11/07   cath 11/11 :  3V CADsee report    Cataract    small both eyes   Chronic lower back pain    Chronic pain    MR 08/07/10 :  Spondylosis L4-5 with B foraminal narrowing and L4 nerve root enchroachment B.   Chronic venous insufficiency 2012   Has had full W/U incl ECHO, LFT's, Cr, Umicro, TSH, BNP. Symptomatic treatment.   Colonic polyp 1995   Colonoscopies 1994, 2000, and 02/02/2011. Last had 9 polyps - Tubular adenoma and tubulovillous was the pathology results without high grade dysplasia   Diabetes mellitus type II    Insulin dependent. Has worked with [name]  R previously.   Diastolic CHF, chronic (HCC)    NL EF by echo 01/2012, Gr I dd   Essential hypertension    Requires 5 drug therapy and still difficult to control.   GERD (gastroesophageal reflux disease)    Heart murmur    History of radiation therapy 10/02/18- 11/09/18   Left chest wall SCV, Left chest wall boost, Right chest wall SCV, right chest boost all received 28 fractions for a total dose of 50.04 Gy   Hyperlipidemia    On daily statin   Iron deficiency anemia    Ferritin was 12 in 01-08-11. on FeSo4. Has had colon and EGD 02/02/11 that showed mild gastritis and colon polyps.   Obesity    Morbid. HAs worked with [name] T previously.   OSA (obstructive sleep apnea) 02/16/2007   02/2007 : Moderate AHI 35.6, O2 sat decreased to 65%, CPAP titration to 16 with AHI of 3.6. 02/2014 : Rare respiratory events with sleep disturbance, within normal limits. AHI 0.4 per hour       OSA on CPAP    Personal history of chemotherapy    Personal history of radiation therapy    "to my left breast"   Refusal of blood transfusions as patient is Jehovah's Witness    Seasonal allergies    "grass pollen"    Family History:  Family History  Problem Relation Age of Onset   Lung cancer Mother    Diabetes Father    Breast cancer Sister    Pulmonary embolism  Brother    Depression Daughter 14   Osteoarthritis Maternal Grandmother    Heart attack Brother 53   Kidney failure Brother        died 08-Jan-2016 2/2 kidney and heart failure   Colon cancer Neg Hx    Colon polyps Neg Hx    Esophageal cancer Neg Hx    Rectal cancer Neg Hx    Stomach cancer Neg Hx     Social History:  Social History   Socioeconomic History   Marital status: Legally Separated    Spouse name: Not on file   Number of children: Not on file   Years of education: 14   Highest education level: Not on file  Occupational History   Not on file  Social Needs   Financial resource strain: Not on file   Food insecurity    Worry: Not on file    Inability: Not on file   Transportation needs    Medical: Yes    Non-medical: No  Tobacco Use   Smoking status: Never Smoker   Smokeless tobacco: Never Used  Substance and Sexual Activity   Alcohol use: No   Drug use: No   Sexual activity: Not Currently  Lifestyle   Physical activity    Days per week: Not on file    Minutes per session: Not on file   Stress: Not at all  Relationships   Social connections    Talks on phone: Not on file    Gets together: Not on file    Attends religious service: Not on file    Active member of club or organization: Not on file    Attends meetings of clubs or organizations: Not on file    Relationship status: Not on file   Intimate partner violence    Fear of current or ex partner: No    Emotionally abused: No    Physically abused: No    Forced sexual activity: No  Other Topics Concern   Not on file  Social History Narrative   Worked at [employer] part-time from [phone], 2009-2011. No longer working 2015.Associates degree in early childhood education. Single, lives by self.    Review of Systems: A complete ROS was negative except as per HPI.  Physical Exam: Blood pressure 108/63, pulse 97, temperature 100 F (37.8 C), temperature source Oral,  resp. rate 18, height 5\' 1"  (1.549 m), weight 93.5 kg, last menstrual period 07/12/1984, SpO2 96 %. Physical Exam  Constitutional: She is oriented to person, place, and time. She has a sickly appearance.  Neck: Hepatojugular reflux present. No JVD present.  Cardiovascular: Normal rate, regular rhythm, normal heart sounds and intact distal pulses. Exam reveals no gallop and no friction rub.  No murmur heard. Pulmonary/Chest: Effort normal and breath sounds normal. No respiratory distress. She has no wheezes. She has no rales. She exhibits no tenderness.  Abdominal: Soft. She exhibits no distension. There is no abdominal tenderness.  Musculoskeletal:        General: Edema present.  Neurological: She is alert and oriented to person, place, and time.  Skin: Skin is warm and dry.     EKG: personally reviewed my interpretation is ST depressions in lateral leads.  [place] RUQ abdomen: Diffuse hepatic steatosis  CXR: personally reviewed my interpretation is within normal limits  Assessment & Plan by Problem: Principal Problem:   NSTEMI (non-ST elevated myocardial infarction) ([place]) Active Problems:   OSA (obstructive sleep apnea)   DM type 2, uncontrolled, with retinopathy (HCC)   Chronic diastolic heart failure (HCC)   Transaminitis     Plan:  Norhan Tanzillo is a 68 year old female with pertinent past medical history of hree-vessel coronary disease, HFpEF, diabetes, hypertension, hyperlipidemia, breast cancer status post chemo and radiation, obesity  came to the ED for evaluation of generalized weakness was found to have chest pain.  NSTEMI - patient's event was originally 63 and then increased to 802. Recent echo in September 2020 showed preserved ejection fraction of 50-55%.  Left ventricular global hypokinesis.  Small circumferential pericardial effusion.  Cath from 2018 shows severe multivessel disease.  Her EKG shows ST depressions in her lateral leads.  - Cardiology consulted - Patient  given morphine, aspirin, heparin, and nitroglycerin  - Echo ordered for the morning. - Low threshold for cardiac cath.  Fever -  likely secondary to ACS.  Cardiology will hold off on cath due to concern for possible infectious etiology. Lactic acid has decreased to 1.4.  Transaminitis - Ultrasound of RUQ showed diffuse hepatic steatosis  - Consider CT with contrast of the abdomen to rule out metastatic disease or hepatic vein thrombosis.  We will defer this test at this time due to possibility of heart cath in the setting of NSTEMI. -Viral hepatitis panel ordered. - Could be due to subclinical volume disturbance leading to ischemic hepatitis. Will add on LDH. - Concern for encephalopathy, thought to be due to morphine. We checked on her multiple times and she seems to be improving.   OSA - Ordered CPAP  Diabetes mellitus - NovoLog 3 times daily moderate biding scale  Hx. 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   Dispo: Admit patient to Inpatient with expected length of stay greater than 2 midnights.  Signed: Marianna Payment, MD 06/11/2019, 2:40 AM  Pager: [phone]

## 2019-06-10 NOTE — ED Notes (Signed)
Attempted to give report, will call back in 5.

## 2019-06-11 ENCOUNTER — Inpatient Hospital Stay (HOSPITAL_COMMUNITY): Payer: Medicare Other

## 2019-06-11 DIAGNOSIS — Z9013 Acquired absence of bilateral breasts and nipples: Secondary | ICD-10-CM

## 2019-06-11 DIAGNOSIS — G8929 Other chronic pain: Secondary | ICD-10-CM

## 2019-06-11 DIAGNOSIS — R509 Fever, unspecified: Secondary | ICD-10-CM

## 2019-06-11 DIAGNOSIS — M1612 Unilateral primary osteoarthritis, left hip: Secondary | ICD-10-CM

## 2019-06-11 DIAGNOSIS — I340 Nonrheumatic mitral (valve) insufficiency: Secondary | ICD-10-CM

## 2019-06-11 DIAGNOSIS — Z853 Personal history of malignant neoplasm of breast: Secondary | ICD-10-CM

## 2019-06-11 DIAGNOSIS — I214 Non-ST elevation (NSTEMI) myocardial infarction: Principal | ICD-10-CM

## 2019-06-11 DIAGNOSIS — R740 Nonspecific elevation of levels of transaminase and lactic acid dehydrogenase [LDH]: Secondary | ICD-10-CM

## 2019-06-11 DIAGNOSIS — I371 Nonrheumatic pulmonary valve insufficiency: Secondary | ICD-10-CM

## 2019-06-11 DIAGNOSIS — Z923 Personal history of irradiation: Secondary | ICD-10-CM

## 2019-06-11 DIAGNOSIS — I5032 Chronic diastolic (congestive) heart failure: Secondary | ICD-10-CM

## 2019-06-11 DIAGNOSIS — Z91048 Other nonmedicinal substance allergy status: Secondary | ICD-10-CM

## 2019-06-11 DIAGNOSIS — K760 Fatty (change of) liver, not elsewhere classified: Secondary | ICD-10-CM

## 2019-06-11 DIAGNOSIS — Z885 Allergy status to narcotic agent status: Secondary | ICD-10-CM

## 2019-06-11 DIAGNOSIS — K30 Functional dyspepsia: Secondary | ICD-10-CM

## 2019-06-11 DIAGNOSIS — Z888 Allergy status to other drugs, medicaments and biological substances status: Secondary | ICD-10-CM

## 2019-06-11 DIAGNOSIS — E785 Hyperlipidemia, unspecified: Secondary | ICD-10-CM

## 2019-06-11 DIAGNOSIS — I2510 Atherosclerotic heart disease of native coronary artery without angina pectoris: Secondary | ICD-10-CM

## 2019-06-11 DIAGNOSIS — E669 Obesity, unspecified: Secondary | ICD-10-CM

## 2019-06-11 DIAGNOSIS — Z794 Long term (current) use of insulin: Secondary | ICD-10-CM

## 2019-06-11 DIAGNOSIS — E119 Type 2 diabetes mellitus without complications: Secondary | ICD-10-CM

## 2019-06-11 DIAGNOSIS — Z9221 Personal history of antineoplastic chemotherapy: Secondary | ICD-10-CM

## 2019-06-11 DIAGNOSIS — G4733 Obstructive sleep apnea (adult) (pediatric): Secondary | ICD-10-CM

## 2019-06-11 DIAGNOSIS — I110 Hypertensive heart disease with heart failure: Secondary | ICD-10-CM

## 2019-06-11 DIAGNOSIS — Z79899 Other long term (current) drug therapy: Secondary | ICD-10-CM

## 2019-06-11 LAB — LACTIC ACID, PLASMA
Lactic Acid, Venous: 2.3 mmol/L (ref 0.5–1.9)
Lactic Acid, Venous: 1.4 mmol/L (ref 0.5–1.9)

## 2019-06-11 LAB — CBC
HCT: 37 % (ref 36.0–46.0)
Hemoglobin: 12.1 g/dL (ref 12.0–15.0)
MCH: 30.8 pg (ref 26.0–34.0)
MCHC: 32.7 g/dL (ref 30.0–36.0)
MCV: 94.1 fL (ref 80.0–100.0)
Platelets: 173 10*3/uL (ref 150–400)
RBC: 3.93 MIL/uL (ref 3.87–5.11)
RDW: 15 % (ref 11.5–15.5)
WBC: 9.7 10*3/uL (ref 4.0–10.5)
nRBC: 0 % (ref 0.0–0.2)

## 2019-06-11 LAB — HEPARIN LEVEL (UNFRACTIONATED)
Heparin Unfractionated: 0.57 IU/mL (ref 0.30–0.70)
Heparin Unfractionated: 0.42 IU/mL (ref 0.30–0.70)

## 2019-06-11 LAB — GLUCOSE, CAPILLARY
Glucose-Capillary: 221 mg/dL — ABNORMAL HIGH (ref 70–99)
Glucose-Capillary: 189 mg/dL — ABNORMAL HIGH (ref 70–99)
Glucose-Capillary: 168 mg/dL — ABNORMAL HIGH (ref 70–99)
Glucose-Capillary: 182 mg/dL — ABNORMAL HIGH (ref 70–99)

## 2019-06-11 LAB — COMPREHENSIVE METABOLIC PANEL
ALT: 271 U/L — ABNORMAL HIGH (ref 0–44)
ALT: 258 U/L — ABNORMAL HIGH (ref 0–44)
AST: 329 U/L — ABNORMAL HIGH (ref 15–41)
AST: 343 U/L — ABNORMAL HIGH (ref 15–41)
Albumin: 3.6 g/dL (ref 3.5–5.0)
Albumin: 3.2 g/dL — ABNORMAL LOW (ref 3.5–5.0)
Alkaline Phosphatase: 108 U/L (ref 38–126)
Alkaline Phosphatase: 104 U/L (ref 38–126)
Anion gap: 12 (ref 5–15)
Anion gap: 12 (ref 5–15)
BUN: 35 mg/dL — ABNORMAL HIGH (ref 8–23)
BUN: 38 mg/dL — ABNORMAL HIGH (ref 8–23)
CO2: 20 mmol/L — ABNORMAL LOW (ref 22–32)
CO2: 20 mmol/L — ABNORMAL LOW (ref 22–32)
Calcium: 9.2 mg/dL (ref 8.9–10.3)
Calcium: 8.9 mg/dL (ref 8.9–10.3)
Chloride: 106 mmol/L (ref 98–111)
Chloride: 105 mmol/L (ref 98–111)
Creatinine, Ser: 1.19 mg/dL — ABNORMAL HIGH (ref 0.44–1.00)
Creatinine, Ser: 1.3 mg/dL — ABNORMAL HIGH (ref 0.44–1.00)
GFR calc Af Amer: 54 mL/min — ABNORMAL LOW (ref >60)
GFR calc Af Amer: 49 mL/min — ABNORMAL LOW (ref >60)
GFR calc non Af Amer: 47 mL/min — ABNORMAL LOW (ref >60)
GFR calc non Af Amer: 42 mL/min — ABNORMAL LOW (ref >60)
Glucose, Bld: 190 mg/dL — ABNORMAL HIGH (ref 70–99)
Glucose, Bld: 233 mg/dL — ABNORMAL HIGH (ref 70–99)
Potassium: 4.9 mmol/L (ref 3.5–5.1)
Potassium: 4.5 mmol/L (ref 3.5–5.1)
Sodium: 138 mmol/L (ref 135–145)
Sodium: 137 mmol/L (ref 135–145)
Total Bilirubin: 2.4 mg/dL — ABNORMAL HIGH (ref 0.3–1.2)
Total Bilirubin: 3.1 mg/dL — ABNORMAL HIGH (ref 0.3–1.2)
Total Protein: 7 g/dL (ref 6.5–8.1)
Total Protein: 6.7 g/dL (ref 6.5–8.1)

## 2019-06-11 LAB — MRSA PCR SCREENING: MRSA by PCR: NEGATIVE

## 2019-06-11 LAB — ECHOCARDIOGRAM COMPLETE
Height: 61 in
Weight: 3343.94 oz

## 2019-06-11 LAB — URINE CULTURE: Culture: NO GROWTH

## 2019-06-11 LAB — TROPONIN I (HIGH SENSITIVITY)
Troponin I (High Sensitivity): 12648 ng/L (ref <18)
Troponin I (High Sensitivity): 20461 ng/L (ref <18)

## 2019-06-11 LAB — LIPID PANEL
Cholesterol: 214 mg/dL — ABNORMAL HIGH (ref 0–200)
HDL: 66 mg/dL (ref >40)
LDL Cholesterol: 141 mg/dL — ABNORMAL HIGH (ref 0–99)
Total CHOL/HDL Ratio: 3.2 RATIO
Triglycerides: 33 mg/dL (ref <150)
VLDL: 7 mg/dL (ref 0–40)

## 2019-06-11 LAB — SEDIMENTATION RATE: Sed Rate: 42 mm/hr — ABNORMAL HIGH (ref 0–22)

## 2019-06-11 LAB — LACTATE DEHYDROGENASE: LDH: 467 U/L — ABNORMAL HIGH (ref 98–192)

## 2019-06-11 LAB — BILIRUBIN, DIRECT: Bilirubin, Direct: 1.6 mg/dL — ABNORMAL HIGH (ref 0.0–0.2)

## 2019-06-11 LAB — C-REACTIVE PROTEIN: CRP: 11.4 mg/dL — ABNORMAL HIGH (ref <1.0)

## 2019-06-11 MED ORDER — VANCOMYCIN HCL IN DEXTROSE 750-5 MG/150ML-% IV SOLN
750.0000 mg | INTRAVENOUS | Status: DC
  Administered 2019-06-11 – 2019-06-12 (×2): 750 mg via INTRAVENOUS
  Filled 2019-06-11 (×3): qty 150

## 2019-06-11 MED ORDER — MAGNESIUM SULFATE 2 GM/50ML IV SOLN
2.0000 g | Freq: Once | INTRAVENOUS | Status: AC
  Administered 2019-06-11: 2 g via INTRAVENOUS
  Filled 2019-06-11: qty 50

## 2019-06-11 MED ORDER — SODIUM CHLORIDE 0.9 % IV SOLN
INTRAVENOUS | Status: DC | PRN
  Administered 2019-06-11: 250 mL via INTRAVENOUS

## 2019-06-11 MED ORDER — DIPHENHYDRAMINE HCL 25 MG PO CAPS: 25.0000 mg | ORAL_CAPSULE | Freq: Four times a day (QID) | ORAL | Status: DC | PRN

## 2019-06-11 MED ORDER — SODIUM CHLORIDE 0.9 % IV SOLN
2.0000 g | Freq: Two times a day (BID) | INTRAVENOUS | Status: DC
  Filled 2019-06-11: qty 2

## 2019-06-11 MED ORDER — ONDANSETRON HCL 4 MG/2ML IJ SOLN
4.0000 mg | Freq: Four times a day (QID) | INTRAMUSCULAR | Status: DC | PRN
  Administered 2019-06-12: 4 mg via INTRAVENOUS
  Filled 2019-06-11: qty 2

## 2019-06-11 MED ORDER — VANCOMYCIN HCL IN DEXTROSE 750-5 MG/150ML-% IV SOLN: 750.0000 mg | INTRAVENOUS | Status: DC

## 2019-06-11 MED ORDER — ONDANSETRON HCL 4 MG/2ML IJ SOLN
INTRAMUSCULAR | Status: AC
  Administered 2019-06-11: 17:00:00
  Filled 2019-06-11: qty 2

## 2019-06-11 MED ORDER — SODIUM CHLORIDE 0.9 % IV SOLN
2.0000 g | Freq: Two times a day (BID) | INTRAVENOUS | Status: DC
  Administered 2019-06-11 – 2019-06-13 (×5): 2 g via INTRAVENOUS
  Filled 2019-06-11 (×6): qty 2

## 2019-06-11 NOTE — Progress Notes (Addendum)
Subjective: Pt seen at the bedside on rounds this AM. The patient reports pressure and sharp pain around her heart. She says she started having chest pain started after she got chemotherapy and radiation which ended in February.  Reports current pain is same as the chronic pain. States she has had chronic indigestion as well.  Objective:  Vital signs in last 24 hours: Vitals:   06/11/19 0500 06/11/19 0600 06/11/19 0700 06/11/19 1039  BP: (!) 95/47 121/62 (!) 113/58 (!) 109/53  Pulse: 84 89 83 73  Resp: (!) 21 18 (!) 29 15  Temp:   99 F (37.2 C) 98 F (36.7 C)  TempSrc:   Axillary Oral  SpO2: 91% 92% 92% 96%  Weight:      Height:       Physical Exam: General: Ill appearing female laying in bed with eyes closed for most of interview HEENT: NCAT CV: RRR, no JVD appreciated, Normal S1 & S2 without murmurs, rubs or gallops, 1+ pitting edema in bilateral lower extremities  PULM: CTAB, limited secondary to large body habitus  ABD: No tenderness to palpation in all quadrants, no guarding  NEURO: Alert and oriented, no focal deficits  Bedside cardiac ultrasound:  No overt evidence of wall motion abnormalities  Assessment/Plan:  Principal Problem:   NSTEMI (non-ST elevated myocardial infarction) ([place]) Active Problems:   DM type 2, uncontrolled, with retinopathy (HCC)   Chronic diastolic heart failure (HCC)   Transaminitis   Fever  In summary, Ms. Amundson is a 68 year old Jehovah Witness with hx of  three-vessel coronary disease, HFpEF, T2DM, HTN, HLD, breast cancer status post bilateral mastectomy with chemo and radiation (last radiation tx Feburary 2020), and obesity who presented with NSTEMI, fever, and transaminitis.   #Chest pain #CAD #NSTEMI: The patient's event was originally 68 and then increased to 68 Recent echo in September 2020 showed preserved ejection fraction of 50-55% with grade 1 diastolic dysfunction and small circumferential pericardial effusion. Cath from 2018  shows severe multivessel disease but it was decided to medically manage her at the time. Her EKG shows on admission ST depressions in her lateral leads. Troponins have uptrended to 12,648 after initial value of 86. Today, the patient continues to have chest discomfort.  - Cardiology consulted, will appreciate recommendations:  - Plan to perform heart catheterization once patient is afebrile for 24 hours  - Nitroglycerin drip - Nitroglycerin tablets 0.4mg  q47min PRN - Heparin drip  - ASA 81 mg daily  - Echocardiogram ordered   #Transaminitis: The patient presented with transaminitis and her AST/ALT has uptrended to 343/258. At this time, we are still considering ischemic disease secondary to NSTEMI, hepatitis or DILI. RUQ u/s was performed and showed diffuse hepatic steatosis, but no evidence of thrombosis or cholestasis. It is unclear what the root cause is at this time. We will continue to work this up. - Viral hepatitis panel is currently pending    #Fever: Tmax 102. Pt has been afebrile since coming to the ED, but in the setting of transaminitis is concerning. No leukocytosis, but did have elevated sed rate & CRP to 42 and 11.4 respectively which could suggest infection. The patient does not have SOB, productive cough, abdominal pain, urinary complaints, or new skin lesions that would suggest she has an infectious source.  - F/u blood cultures  - Vanc and cefepime empirically until blood cultures negative at 2 days.  #OSA - Ordered CPAP PRN   #T2DM -SSI    #Hx. Of Breast  Cancer  - Holding home Anastrozole.     #Dispo: Pending medical course   Patient is a Jehovah's Witness and does not accept blood products.  Earlene Plater, MD Internal Medicine, PGY1 Pager: [phone]  06/11/2019,11:49 AM

## 2019-06-11 NOTE — Progress Notes (Signed)
ANTICOAGULATION CONSULT NOTE - Follow Up Consult  Pharmacy Consult for heparin Indication: chest pain/ACS  Patient Measurements: Heparin Dosing Weight: 70 kg  Vital Signs: Temp: 99 F (37.2 C) (09/07 0700) Temp Source: Axillary (09/07 0700) BP: 113/58 (09/07 0700) Pulse Rate: 83 (09/07 0700)  Labs: Recent Labs    06/10/19 1515 06/10/19 1900 06/11/19 0036 06/11/19 0543  HGB 12.3  --  12.1  --   HCT 39.1  --  37.0  --   PLT 177  --  173  --   APTT 25  --   --   --   LABPROT 13.7  --   --   --   INR 1.1  --   --   --   HEPARINUNFRC  --   --  0.57 0.42  CREATININE 1.04*  --  1.19*  --   TROPONINIHS 86* 802*  --   --     Estimated Creatinine Clearance: 47.6 mL/min (A) (by C-G formula based on SCr of 1.19 mg/dL (H)).   Medical History: Past Medical History:  Diagnosis Date  . Arthritis    "knees, lower back" (08/18/2017)  . 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 S/p lumpectomy and radiation.  Marland Kitchen CAD (coronary artery disease) 11/07   cath 11/11 :  3V CADsee report   . Cataract    small both eyes  . Chronic lower back pain   . Chronic pain    MR 08/07/10 :  Spondylosis L4-5 with B foraminal narrowing and L4 nerve root enchroachment B.  . Chronic venous insufficiency 2012   Has had full W/U incl ECHO, LFT's, Cr, Umicro, TSH, BNP. Symptomatic treatment.  . Colonic polyp 1995   Colonoscopies 1994, 2000, and 02/02/2011. Last had 9 polyps - Tubular adenoma and tubulovillous was the pathology results without high grade dysplasia  . Diabetes mellitus type II    Insulin dependent. Has worked with [name] R previously.  . Diastolic CHF, chronic ([place])    NL EF by echo 01/2012, Gr I dd  . Essential hypertension    Requires 5 drug therapy and still difficult to control.  Marland Kitchen GERD (gastroesophageal reflux disease)   . Heart murmur   . History of radiation therapy 10/02/18- 11/09/18   Left chest wall SCV, Left chest wall boost, Right  chest wall SCV, right chest boost all received 28 fractions for a total dose of 50.04 Gy  . Hyperlipidemia    On daily statin  . Iron deficiency anemia    Ferritin was 12 in 2012. on FeSo4. Has had colon and EGD 02/02/11 that showed mild gastritis and colon polyps.  . Obesity    Morbid. HAs worked with [name] T previously.  . OSA (obstructive sleep apnea) 02/16/2007   02/2007 : Moderate AHI 35.6, O2 sat decreased to 65%, CPAP titration to 16 with AHI of 3.6. 02/2014 : Rare respiratory events with sleep disturbance, within normal limits. AHI 0.4 per hour      . OSA on CPAP   . Personal history of chemotherapy   . Personal history of radiation therapy    "to my left breast"  . Refusal of blood transfusions as patient is Jehovah's Witness   . Seasonal allergies    "grass pollen"    Assessment: 68 yo female with known 3-vessel CAD amongst other comorbidities. On heparin infusion for NSTEMI. Troponin positive. SCr and CBC wnl.   Heparin level therapeutic at 0.42,  CBC WNL, no bleeding noted.  Goal of Therapy:  Heparin level 0.3-0.7 units/ml Monitor platelets by anticoagulation protocol: Yes    Plan:  -Continue heparin infusion at 900 units/hr -Daily heparin level, CBC -Monitor for bleeding  Vertis Kelch, PharmD PGY2 Cardiology Pharmacy Resident Phone [phone] 06/11/2019       7:31 AM  Please check [email or web address] for unit-specific pharmacist phone numbers

## 2019-06-11 NOTE — Progress Notes (Signed)
ANTICOAGULATION CONSULT NOTE - Follow Up Consult  Pharmacy Consult for heparin Indication: chest pain/ACS  Labs: Recent Labs    06/10/19 1515 06/10/19 1900 06/11/19 0036  HGB 12.3  --  12.1  HCT 39.1  --  37.0  PLT 177  --  173  APTT 25  --   --   LABPROT 13.7  --   --   INR 1.1  --   --   HEPARINUNFRC  --   --  0.57  CREATININE 1.04*  --   --   TROPONINIHS 86* 802*  --     Assessment/Plan  68yo female therapeutic on heparin with initial though lab was drawn just 3h after bolus. Will continue gtt at current rate for now and confirm stable with additional level.    Wynona Neat, PharmD, BCPS  06/11/2019,1:06 AM

## 2019-06-11 NOTE — Progress Notes (Signed)
  Echocardiogram 2D Echocardiogram has been performed.  [name] 06/11/2019, 10:35 AM

## 2019-06-11 NOTE — Plan of Care (Signed)
  Problem: Education: Goal: Knowledge of General Education information will improve Description: Including pain rating scale, medication(s)/side effects and non-pharmacologic comfort measures Outcome: Progressing   Problem: Activity: Goal: Risk for activity intolerance will decrease Outcome: Progressing   Problem: Coping: Goal: Level of anxiety will decrease Outcome: Progressing   Problem: Pain Managment: Goal: General experience of comfort will improve Outcome: Progressing   Problem: Skin Integrity: Goal: Risk for impaired skin integrity will decrease Outcome: Progressing   

## 2019-06-11 NOTE — Progress Notes (Signed)
Progress Note  Patient Name: [name] Date of Encounter: 06/11/2019  Primary Cardiologist: No primary care provider on file.   Subjective   Still has some intermittent pain but appears comfortable  Inpatient Medications    Scheduled Meds: . aspirin EC  81 mg Oral Daily  . Chlorhexidine Gluconate Cloth  6 each Topical Q0600  . insulin aspart  0-15 Units Subcutaneous TID WC  . mouth rinse  15 mL Mouth Rinse BID  . metoprolol succinate  50 mg Oral Daily  . mupirocin ointment  1 application Nasal BID   Continuous Infusions: . sodium chloride 250 mL (06/11/19 [id number])  . heparin 900 Units/hr (06/10/19 2121)  . nitroGLYCERIN 15 mcg/min (06/11/19 0744)   PRN Meds: sodium chloride, nitroGLYCERIN   Vital Signs    Vitals:   06/11/19 0451 06/11/19 0500 06/11/19 0600 06/11/19 0700  BP:  (!) 95/47 121/62 (!) 113/58  Pulse:  84 89 83  Resp:  (!) 21 18 (!) 29  Temp:    99 F (37.2 C)  TempSrc:    Axillary  SpO2:  91% 92% 92%  Weight: 94.8 kg     Height:        Intake/Output Summary (Last 24 hours) at 06/11/2019 0815 Last data filed at 06/11/2019 0524 Gross per 24 hour  Intake 96.86 ml  Output 350 ml  Net -253.14 ml   Filed Weights   06/10/19 2248 06/11/19 0451  Weight: 93.5 kg 94.8 kg    Telemetry    NSR - Personally Reviewed  ECG    NSR with nonspecific ST/T wave abnormality - Personally Reviewed  Physical Exam   GEN: No acute distress.   Neck: No JVD Cardiac: RRR, no murmurs, rubs, or gallops.  Respiratory: Clear to auscultation bilaterally. GI: Soft, nontender, non-distended  MS: No edema; No deformity. Neuro:  Nonfocal  Psych: Normal affect   Labs    Chemistry Recent Labs  Lab 06/10/19 1515 06/11/19 0036 06/11/19 0629  NA 136 138 137  K 4.9 4.9 4.5  CL 102 106 105  CO2 20* 20* 20*  GLUCOSE 193* 190* 233*  BUN 38* 35* 38*  CREATININE 1.04* 1.19* 1.30*  CALCIUM 10.0 9.2 8.9  PROT 7.6 7.0 6.7  ALBUMIN 4.3 3.6 3.2*  AST 299* 329* 343*   ALT 171* 271* 258*  ALKPHOS 105 108 104  BILITOT 1.8* 2.4* 3.1*  GFRNONAA 55* 47* 42*  GFRAA >60 54* 49*  ANIONGAP 14 12 12      Hematology Recent Labs  Lab 06/10/19 1515 06/11/19 0036  WBC 8.1 9.7  RBC 4.12 3.93  HGB 12.3 12.1  HCT 39.1 37.0  MCV 94.9 94.1  MCH 29.9 30.8  MCHC 31.5 32.7  RDW 14.8 15.0  PLT 177 173    Cardiac EnzymesNo results for input(s): TROPONINI in the last 168 hours. No results for input(s): TROPIPOC in the last 168 hours.   BNP Recent Labs  Lab 06/10/19 1515 06/10/19 2036  BNP 86.1 250.1*     DDimer No results for input(s): DDIMER in the last 168 hours.   Radiology    Dg Chest Port 1 View  Result Date: 06/10/2019 CLINICAL DATA:  Generalized weakness and vomiting. EXAM: PORTABLE CHEST 1 VIEW COMPARISON:  May 17, 2018 FINDINGS: Cardiomediastinal silhouette is normal. Mediastinal contours appear intact. Calcific atherosclerotic disease of the aorta. There is no evidence of focal airspace consolidation, pleural effusion or pneumothorax. Osseous structures are without acute abnormality. Postsurgical changes in the bilateral chest  wall. IMPRESSION: No active disease. Electronically Signed   By: Fidela Salisbury M.D.   On: 06/10/2019 17:07   Dg Hips Bilat W Or Wo Pelvis 3-4 Views  Result Date: 06/10/2019 CLINICAL DATA:  LEFT hip pain EXAM: DG HIP (WITH OR WITHOUT PELVIS) 3-4V BILAT COMPARISON:  Pelvic radiograph 03/31/2012 FINDINGS: Osseous demineralization. Advanced osteoarthritic changes of both hip joints with joint space narrowing and spur formation, significantly progressive since prior study. SI joints preserved. No acute fracture, dislocation, or bone destruction. IMPRESSION: Advanced osteoarthritic changes of both hips, progressive since 2013. Osseous demineralization. Electronically Signed   By: Lavonia Dana M.D.   On: 06/10/2019 17:54   US Abdomen Limited Ruq  Result Date: 06/10/2019 CLINICAL DATA:  Transaminitis. EXAM: ULTRASOUND ABDOMEN  LIMITED RIGHT UPPER QUADRANT COMPARISON:  Chest, abdomen and pelvis CT dated 04/20/2018. FINDINGS: Gallbladder: No gallstones or wall thickening visualized. No sonographic Murphy sign noted by sonographer. Common bile duct: Diameter: 5.0 mm Liver: Diffusely echogenic. Corresponding diffuse low density on the previous CT. Portal vein is patent on color Doppler imaging with normal direction of blood flow towards the liver. Other: None. IMPRESSION: 1. Diffuse hepatic steatosis. 2. Otherwise, normal examination. Electronically Signed   By: Claudie Revering M.D.   On: 06/10/2019 18:05    Cardiac Studies   2D echo IMPRESSIONS    1. The left ventricle has low normal systolic function, with an ejection fraction of 50-55%. The cavity size was normal. Left ventricular diastolic Doppler parameters are indeterminate. Left ventrical global hypokinesis without regional wall motion  abnormalities.  2. The right ventricle has normal systolic function. The cavity was normal. There is no increase in right ventricular wall thickness. Right ventricular systolic pressure could not be assessed.  3. Small pericardial effusion.  4. The pericardial effusion is circumferential.  5. Mild thickening of the mitral valve leaflet. There is mild mitral annular calcification present.  6. The aortic valve is tricuspid. Mild thickening of the aortic valve. Mild calcification of the aortic valve. Aortic valve regurgitation is trivial by color flow Doppler.  7. The aorta is normal unless otherwise noted.  8. Possible ASD.  Cardiac Cath 08/2017 Conclusion   Severe multivessel coronary disease including a segmental proximal to mid 70-80 80% LAD that was proven hemodynamically significant by FFR of 0.64.  90-95% obstruction in the first obtuse marginal, proximal circumflex 80% stenosis, mid circumflex 90% stenosis, circumflex branches beyond the first obtuse marginal quite small and not graftable.  Small 70-80% ramus intermedius   40% mid body left main stenosis.  Catheter damping with engagement using the 6 [place] guide catheter.  Nondominant right coronary with proximal 80-90% obstruction.  Normal left ventricular function with EF estimated at 65%.  Normal left ventricular filling pressures.  RECOMMENDATIONS:   Uptitrate medical therapy with additional long-acting nitrate, and beta-blocker.  Recommend the patient consider coronary artery bypass grafting given diabetes and multifocal nature of disease.  She has some reservations and wants to know more about alternative treatment strategies but wants to learn about bypass surgery in her particular situation.  Percutaneous options exist in the first obtuse marginal which is the likely culprit in the mid LAD.  Briefly discussed percutaneous option and incomplete nature of revascularization.     Patient Profile     68 y.o. female with a history of three-vessel coronary disease, medically managed, HFpEF, diabetes, hypertension, hyperlipidemia, breast cancer status post chemo and radiation, obesity, who is being seen for the evaluation of chest pain and elevated troponin  Assessment & Plan    1.  NSTEMI  -admitted with CP and elevated trop c/w NSTEMI. -has been using a NTG patch PRN but recently using it daily due to CP and SOB -Mild CP this am -Cath in 2018 with severe CAD and CABG recommended but she refused due to not wanting blood products  Echo 06/2019 with EF 50-55% with small circumferential pericardial effusion -continue ASA, Heparin gtt,IV NTG gtt.  Statin intolerant. -2D echo this am to reassess wall motion and pericardial effusion -initial hsTrop 86 and now 12,648 -plan heart cath once afebrile - hopefully tomorrow - will make NPO after MN  2.  ASCAD  - cath 2018 with 70 - 80% LAD stenosis that was significant by FFR.  There was 90 - 95% OM1 stenosis, prox circ 80% a nd mid circ 90% stenosis.  There was high grade disease in a small RI and non  domimnant right.  EF was NL.   proximal RCA stenosis, first marginal 80% stenosis, mid LAD 70% stenosis, distal LAD 70% stenosis, distal circumflex 70% stenosis in the circumflex 60% stenosis. The EF was 55%.  However, it was decided to manage her medically at that time.  CABG was discussed but she is unable to consider a blood transfusion (Jehovah's Witness) so after discussion opted for medical management -continue ASA, BB  3.  HTN -BP controlled  -continue Toprol XL 50mg  daily  4.  HLD -LDL goal < 70 -intolerant to statins -last LDL was 141 -on Zetia 10mg  daily  -needs referral to lipid clinic as oupt  5.  Febrile illness with weakness -still with low grade fever -SARS neg and WBC normal -elevated FLTs -possible sepsis      For questions or updates, please contact [facility] Please consult [email or web address] for contact info under Cardiology/STEMI.      Signed, Fransico Him, MD  06/11/2019, 8:15 AM

## 2019-06-11 NOTE — Progress Notes (Signed)
Pharmacy Antibiotic Note  [name] is a 68 y.o. female admitted on 06/10/2019 with ACS and concern for sepsis.  Pharmacy has been consulted for vancomycin and cefepime dosing.  Plan: Vancomycin 2000mg  given in ED; continue with vancomycin 750mg  IV Q24H. Goal AUC 400-550.  Expected AUC 490.  SCr used 1.19.  Cefepime 2g IV Q12H.  Height: 5\' 1"  (154.9 cm) Weight: 206 lb 2.1 oz (93.5 kg) IBW/kg (Calculated) : 47.8  Temp (24hrs), Avg:100.6 F (38.1 C), Min:99.8 F (37.7 C), Max:102 F (38.9 C)  Recent Labs  Lab 06/05/19 1701 06/10/19 1450 06/10/19 1515 06/10/19 2036 06/11/19 0036 06/11/19 0100  WBC  --   --  8.1  --  9.7  --   CREATININE 1.16*  --  1.04*  --  1.19*  --   LATICACIDVEN  --  2.7*  --  2.2*  --  2.3*    Estimated Creatinine Clearance: 47.2 mL/min (A) (by C-G formula based on SCr of 1.19 mg/dL (H)).    Allergies  Allergen Reactions  . Blood-Group Specific Substance Other (See Comments)    NO BLOOD - JEHOVAH'S WITNESS  . Lisinopril Other (See Comments)    Chronic cough secondary to ACE  . Oxycodone-Acetaminophen Hives, Itching and Other (See Comments)    flushing  . Percocet [Oxycodone-Acetaminophen] Hives  . Victoza [Liraglutide] Other (See Comments)    Made her feel "out of it"  . Atorvastatin Other (See Comments)    myalgias     Thank you for allowing pharmacy to be a part of this patient's care.  Wynona Neat, PharmD, BCPS  06/11/2019 1:58 AM

## 2019-06-11 NOTE — Progress Notes (Signed)
Patient reports pain is reduced since increasing the nitro drip to 20 mcg

## 2019-06-11 NOTE — Progress Notes (Signed)
Pharmacy Antibiotic Note  [name] is a 68 y.o. female admitted on 06/10/2019 with ACS and concern for sepsis.  Pharmacy has been consulted for vancomycin and cefepime dosing.  Vancomycin was discontinued, then reordered again today.  No doses missed.  Will restart with previous dosing.  Plan: Restart Vancomycin 750mg  IV Q24H.  First dose now. Goal AUC 400-550.  Expected AUC 521.  SCr used 1.3. Vd 0.5   Thank you for allowing pharmacy to be a part of this patient's care.  [employer], Pharm.D., BCPS Clinical Pharmacist  **Pharmacist phone directory can now be found on [email or web address] ([email or web address]).  Listed under [place].  06/11/2019 4:20 PM

## 2019-06-12 ENCOUNTER — Other Ambulatory Visit: Payer: Self-pay | Admitting: Hematology and Oncology

## 2019-06-12 DIAGNOSIS — G629 Polyneuropathy, unspecified: Secondary | ICD-10-CM

## 2019-06-12 LAB — CBC WITH DIFFERENTIAL/PLATELET
Abs Immature Granulocytes: 0.03 10*3/uL (ref 0.00–0.07)
Basophils Absolute: 0 10*3/uL (ref 0.0–0.1)
Basophils Relative: 0 %
Eosinophils Absolute: 0 10*3/uL (ref 0.0–0.5)
Eosinophils Relative: 0 %
HCT: 33.5 % — ABNORMAL LOW (ref 36.0–46.0)
Hemoglobin: 10.7 g/dL — ABNORMAL LOW (ref 12.0–15.0)
Immature Granulocytes: 0 %
Lymphocytes Relative: 8 %
Lymphs Abs: 0.6 10*3/uL — ABNORMAL LOW (ref 0.7–4.0)
MCH: 30 pg (ref 26.0–34.0)
MCHC: 31.9 g/dL (ref 30.0–36.0)
MCV: 93.8 fL (ref 80.0–100.0)
Monocytes Absolute: 0.6 10*3/uL (ref 0.1–1.0)
Monocytes Relative: 8 %
Neutro Abs: 6.3 10*3/uL (ref 1.7–7.7)
Neutrophils Relative %: 84 %
Platelets: 156 10*3/uL (ref 150–400)
RBC: 3.57 MIL/uL — ABNORMAL LOW (ref 3.87–5.11)
RDW: 15.3 % (ref 11.5–15.5)
WBC: 7.6 10*3/uL (ref 4.0–10.5)
nRBC: 0 % (ref 0.0–0.2)

## 2019-06-12 LAB — COMPREHENSIVE METABOLIC PANEL
ALT: 247 U/L — ABNORMAL HIGH (ref 0–44)
AST: 403 U/L — ABNORMAL HIGH (ref 15–41)
Albumin: 3.1 g/dL — ABNORMAL LOW (ref 3.5–5.0)
Alkaline Phosphatase: 101 U/L (ref 38–126)
Anion gap: 9 (ref 5–15)
BUN: 33 mg/dL — ABNORMAL HIGH (ref 8–23)
CO2: 21 mmol/L — ABNORMAL LOW (ref 22–32)
Calcium: 9.1 mg/dL (ref 8.9–10.3)
Chloride: 108 mmol/L (ref 98–111)
Creatinine, Ser: 1.18 mg/dL — ABNORMAL HIGH (ref 0.44–1.00)
GFR calc Af Amer: 55 mL/min — ABNORMAL LOW (ref >60)
GFR calc non Af Amer: 47 mL/min — ABNORMAL LOW (ref >60)
Glucose, Bld: 179 mg/dL — ABNORMAL HIGH (ref 70–99)
Potassium: 4.3 mmol/L (ref 3.5–5.1)
Sodium: 138 mmol/L (ref 135–145)
Total Bilirubin: 2.5 mg/dL — ABNORMAL HIGH (ref 0.3–1.2)
Total Protein: 6.6 g/dL (ref 6.5–8.1)

## 2019-06-12 LAB — GLUCOSE, CAPILLARY
Glucose-Capillary: 179 mg/dL — ABNORMAL HIGH (ref 70–99)
Glucose-Capillary: 216 mg/dL — ABNORMAL HIGH (ref 70–99)
Glucose-Capillary: 176 mg/dL — ABNORMAL HIGH (ref 70–99)
Glucose-Capillary: 183 mg/dL — ABNORMAL HIGH (ref 70–99)

## 2019-06-12 LAB — HEPARIN LEVEL (UNFRACTIONATED)
Heparin Unfractionated: 0.26 IU/mL — ABNORMAL LOW (ref 0.30–0.70)
Heparin Unfractionated: 0.21 IU/mL — ABNORMAL LOW (ref 0.30–0.70)
Heparin Unfractionated: 0.11 IU/mL — ABNORMAL LOW (ref 0.30–0.70)

## 2019-06-12 LAB — HIV ANTIBODY (ROUTINE TESTING W REFLEX): HIV Screen 4th Generation wRfx: NONREACTIVE

## 2019-06-12 LAB — HEPATITIS B SURFACE ANTIGEN: Hepatitis B Surface Ag: NEGATIVE

## 2019-06-12 MED ORDER — SODIUM CHLORIDE 0.9 % WEIGHT BASED INFUSION
3.0000 mL/kg/h | INTRAVENOUS | Status: DC
  Administered 2019-06-13: 3 mL/kg/h via INTRAVENOUS

## 2019-06-12 MED ORDER — SODIUM CHLORIDE 0.9% FLUSH: 3.0000 mL | INTRAVENOUS | Status: DC | PRN

## 2019-06-12 MED ORDER — SODIUM CHLORIDE 0.9% FLUSH: 3.0000 mL | Freq: Two times a day (BID) | INTRAVENOUS | Status: DC

## 2019-06-12 MED ORDER — SODIUM CHLORIDE 0.9 % WEIGHT BASED INFUSION
1.0000 mL/kg/h | INTRAVENOUS | Status: DC
  Administered 2019-06-13: 1 mL/kg/h via INTRAVENOUS

## 2019-06-12 MED ORDER — SODIUM CHLORIDE 0.9 % IV SOLN: 250.0000 mL | INTRAVENOUS | Status: DC | PRN

## 2019-06-12 MED FILL — GABAPENTIN 300 MG CAPSULE: 300 | 90 days supply | Qty: 90 | Fill #0

## 2019-06-12 NOTE — Progress Notes (Signed)
Progress Note  Patient Name: [name] Date of Encounter: 06/12/2019  Primary Cardiologist:   Minus Breeding, MD   Subjective   Chest pain last night.  None currently.  No SOB.    Inpatient Medications    Scheduled Meds: . aspirin EC  81 mg Oral Daily  . Chlorhexidine Gluconate Cloth  6 each Topical Q0600  . insulin aspart  0-15 Units Subcutaneous TID WC  . mouth rinse  15 mL Mouth Rinse BID  . metoprolol succinate  50 mg Oral Daily  . mupirocin ointment  1 application Nasal BID   Continuous Infusions: . sodium chloride 250 mL (06/11/19 [id number])  . ceFEPime (MAXIPIME) IV 2 g (06/11/19 2218)  . heparin 1,000 Units/hr (06/12/19 0432)  . nitroGLYCERIN 25 mcg/min (06/12/19 0400)  . vancomycin 750 mg (06/11/19 1734)   PRN Meds: sodium chloride, diphenhydrAMINE, nitroGLYCERIN, ondansetron (ZOFRAN) IV   Vital Signs    Vitals:   06/11/19 1925 06/11/19 2300 06/12/19 0004 06/12/19 0333  BP: (!) 115/59 (!) 113/54  129/61  Pulse: 77 75 76 78  Resp: 17 19 19 18   Temp: 98.9 F (37.2 C) 99.1 F (37.3 C)  98.1 F (36.7 C)  TempSrc: Oral Oral  Axillary  SpO2: 90% 93% 94% 97%  Weight:    92.9 kg  Height:        Intake/Output Summary (Last 24 hours) at 06/12/2019 0751 Last data filed at 06/12/2019 0400 Gross per 24 hour  Intake 359.42 ml  Output -  Net 359.42 ml   Filed Weights   06/10/19 2248 06/11/19 0451 06/12/19 0333  Weight: 93.5 kg 94.8 kg 92.9 kg    Telemetry    NSR - Personally Reviewed  ECG    NA - Personally Reviewed  Physical Exam   GEN: No acute distress.   Neck: No  JVD Cardiac: RRR, no murmurs, rubs, or gallops.  Respiratory:   Decreased crackles at the bases left greater than right GI: Soft, nontender, non-distended  MS: No  edema; No deformity. Neuro:  Nonfocal  Psych: Normal affect   Labs    Chemistry Recent Labs  Lab 06/11/19 0036 06/11/19 0629 06/12/19 0318  NA 138 137 138  K 4.9 4.5 4.3  CL 106 105 108  CO2 20* 20* 21*   GLUCOSE 190* 233* 179*  BUN 35* 38* 33*  CREATININE 1.19* 1.30* 1.18*  CALCIUM 9.2 8.9 9.1  PROT 7.0 6.7 6.6  ALBUMIN 3.6 3.2* 3.1*  AST 329* 343* 403*  ALT 271* 258* 247*  ALKPHOS 108 104 101  BILITOT 2.4* 3.1* 2.5*  GFRNONAA 47* 42* 47*  GFRAA 54* 49* 55*  ANIONGAP 12 12 9      Hematology Recent Labs  Lab 06/10/19 1515 06/11/19 0036 06/12/19 0318  WBC 8.1 9.7 7.6  RBC 4.12 3.93 3.57*  HGB 12.3 12.1 10.7*  HCT 39.1 37.0 33.5*  MCV 94.9 94.1 93.8  MCH 29.9 30.8 30.0  MCHC 31.5 32.7 31.9  RDW 14.8 15.0 15.3  PLT 177 173 156    Cardiac EnzymesNo results for input(s): TROPONINI in the last 168 hours. No results for input(s): TROPIPOC in the last 168 hours.   BNP Recent Labs  Lab 06/10/19 1515 06/10/19 2036  BNP 86.1 250.1*     DDimer No results for input(s): DDIMER in the last 168 hours.   Radiology    Dg Chest Port 1 View  Result Date: 06/10/2019 CLINICAL DATA:  Generalized weakness and vomiting. EXAM: PORTABLE CHEST 1  VIEW COMPARISON:  May 17, 2018 FINDINGS: Cardiomediastinal silhouette is normal. Mediastinal contours appear intact. Calcific atherosclerotic disease of the aorta. There is no evidence of focal airspace consolidation, pleural effusion or pneumothorax. Osseous structures are without acute abnormality. Postsurgical changes in the bilateral chest wall. IMPRESSION: No active disease. Electronically Signed   By: Fidela Salisbury M.D.   On: 06/10/2019 17:07   Dg Hips Bilat W Or Wo Pelvis 3-4 Views  Result Date: 06/10/2019 CLINICAL DATA:  LEFT hip pain EXAM: DG HIP (WITH OR WITHOUT PELVIS) 3-4V BILAT COMPARISON:  Pelvic radiograph 03/31/2012 FINDINGS: Osseous demineralization. Advanced osteoarthritic changes of both hip joints with joint space narrowing and spur formation, significantly progressive since prior study. SI joints preserved. No acute fracture, dislocation, or bone destruction. IMPRESSION: Advanced osteoarthritic changes of both hips,  progressive since 2013. Osseous demineralization. Electronically Signed   By: Lavonia Dana M.D.   On: 06/10/2019 17:54   US Abdomen Limited Ruq  Result Date: 06/10/2019 CLINICAL DATA:  Transaminitis. EXAM: ULTRASOUND ABDOMEN LIMITED RIGHT UPPER QUADRANT COMPARISON:  Chest, abdomen and pelvis CT dated 04/20/2018. FINDINGS: Gallbladder: No gallstones or wall thickening visualized. No sonographic Murphy sign noted by sonographer. Common bile duct: Diameter: 5.0 mm Liver: Diffusely echogenic. Corresponding diffuse low density on the previous CT. Portal vein is patent on color Doppler imaging with normal direction of blood flow towards the liver. Other: None. IMPRESSION: 1. Diffuse hepatic steatosis. 2. Otherwise, normal examination. Electronically Signed   By: Claudie Revering M.D.   On: 06/10/2019 18:05    Cardiac Studies   ECHO:  1. The left ventricle has mildly reduced systolic function, with an ejection fraction of 45-50%. The cavity size was normal. Left ventricular diastolic Doppler parameters are consistent with pseudonormalization. Elevated left ventricular end-diastolic pressure Left ventricular diffuse hypokinesis. 2. The right ventricle has normal systolic function. The cavity was normal. There is no increase in right ventricular wall thickness. 3. There is mild to moderate mitral annular calcification present. 4. The aortic valve is tricuspid. Mild sclerosis of the aortic valve. Aortic valve regurgitation is trivial by color flow Doppler. 5. The aorta is normal unless otherwise noted.  Patient Profile     68 y.o. female with a history of three-vessel coronary disease, medically managed, HFpEF, diabetes, hypertension, hyperlipidemia, breast cancer status post chemo and radiation, obesity, who is being seen for the evaluation of chest pain and elevated troponin at the request of Dr. Billy Fischer.  She has a history of 3 vessel CAD and did not want CABG.   Assessment & Plan    NSTEMI:  Plan  heart cath.    Note EF was slightly reduced as above.   On IV Heparin.  NTG IV.  Plan for cath tomorrow.    ASCAD:  See above.   HTN:  BP controlled.  Continue current therapy.    DYSLIPIDEMIA:  Statins on hold with elevated liver enzymes.    FEVER:   On empiric antibiotics.   Afebrile.  Plan per primary team.   Liver enzymes remain elevated.     ELEVATED LIVER ENZYMES:  I would not suspect that this is related to her MI as it continues to rise.  I would not suggest passive hepatic congestion as she does not otherwise have overt right sided heart failure.  I will defer further imaging work up to primary team.     For questions or updates, please contact [facility] Please consult [email or web address] for contact info under Cardiology/STEMI.   Signed, Jeneen Rinks  Rozella Servello, MD  06/12/2019, 7:51 AM

## 2019-06-12 NOTE — Progress Notes (Signed)
ANTICOAGULATION CONSULT NOTE - Follow Up Consult  Pharmacy Consult for heparin Indication: chest pain/ACS  Patient Measurements: Heparin Dosing Weight: 70 kg  Vital Signs: Temp: 99.5 F (37.5 C) (09/08 1855) Temp Source: Oral (09/08 1855) BP: 116/63 (09/08 1900) Pulse Rate: 80 (09/08 1900)  Labs: Recent Labs    06/10/19 1515 06/10/19 1900 06/11/19 0036  06/11/19 0629 06/11/19 1209 06/12/19 0318 06/12/19 1302 06/12/19 2035  HGB 12.3  --  12.1  --   --   --  10.7*  --   --   HCT 39.1  --  37.0  --   --   --  33.5*  --   --   PLT 177  --  173  --   --   --  156  --   --   APTT 25  --   --   --   --   --   --   --   --   LABPROT 13.7  --   --   --   --   --   --   --   --   INR 1.1  --   --   --   --   --   --   --   --   HEPARINUNFRC  --   --  0.57   < >  --   --  0.26* 0.21* 0.11*  CREATININE 1.04*  --  1.19*  --  1.30*  --  1.18*  --   --   TROPONINIHS 86* 802*  --   --  12,648* 20,461*  --   --   --    < > = values in this interval not displayed.    Estimated Creatinine Clearance: 47.4 mL/min (A) (by C-G formula based on SCr of 1.18 mg/dL (H)).   Medical History: Past Medical History:  Diagnosis Date  . Arthritis    "knees, lower back" (08/18/2017)  . 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 S/p lumpectomy and radiation.  Marland Kitchen CAD (coronary artery disease) 11/07   cath 11/11 :  3V CADsee report   . Cataract    small both eyes  . Chronic lower back pain   . Chronic pain    MR 08/07/10 :  Spondylosis L4-5 with B foraminal narrowing and L4 nerve root enchroachment B.  . Chronic venous insufficiency 2012   Has had full W/U incl ECHO, LFT's, Cr, Umicro, TSH, BNP. Symptomatic treatment.  . Colonic polyp 1995   Colonoscopies 1994, 2000, and 02/02/2011. Last had 9 polyps - Tubular adenoma and tubulovillous was the pathology results without high grade dysplasia  . Diabetes mellitus type II    Insulin dependent. Has worked  with [name] R previously.  . Diastolic CHF, chronic ([place])    NL EF by echo 01/2012, Gr I dd  . Essential hypertension    Requires 5 drug therapy and still difficult to control.  Marland Kitchen GERD (gastroesophageal reflux disease)   . Heart murmur   . History of radiation therapy 10/02/18- 11/09/18   Left chest wall SCV, Left chest wall boost, Right chest wall SCV, right chest boost all received 28 fractions for a total dose of 50.04 Gy  . Hyperlipidemia    On daily statin  . Iron deficiency anemia    Ferritin was 12 in 2012. on FeSo4. Has had colon and EGD 02/02/11 that showed mild gastritis and colon polyps.  Marland Kitchen  Obesity    Morbid. HAs worked with [name] T previously.  . OSA (obstructive sleep apnea) 02/16/2007   02/2007 : Moderate AHI 35.6, O2 sat decreased to 65%, CPAP titration to 16 with AHI of 3.6. 02/2014 : Rare respiratory events with sleep disturbance, within normal limits. AHI 0.4 per hour      . OSA on CPAP   . Personal history of chemotherapy   . Personal history of radiation therapy    "to my left breast"  . Refusal of blood transfusions as patient is Jehovah's Witness   . Seasonal allergies    "grass pollen"    Assessment: 68 yo female presented on 06/10/2019 for chest pain in setting of known 3-vessel CAD found to have NSTEMI. Pharmacy consulted to dose heparin for ACS. Troponin increased from 86 on admission to 20,461. Plan for cardiac cath on 9/9.    Heparin level was 0.21 subtherapeutic on heparin 1000 units/hr, so heparin infusion was increased to 1150 units/hr. Per nursing at that time,  no IV line access issues, and no reported bleeding. Slight decrease in Hgb from 12.1 to 10.7. Plt wnl.    Heparin level ~6.5 hrs after increasing heparin infusion rate to 1150 units/hr was lower (0.11 units/ml). Per RN and pt, IV has been beeping a lot over past few hours, which helps explain lower heparin level despite increase in infusion rate. Per RN, no signs/sx of bleeding.  Goal of Therapy:   Heparin level 0.3-0.7 units/ml Monitor platelets by anticoagulation protocol: Yes    Plan:  Increase heparin to 1300 units/hr  Check 6-hr heparin level Monitor daily heparin level, CBC, and signs/sx of bleeding  Follow up anticoagulations plans after cardiac cath on 9/9  Gillermina Hu, PharmD, BCPS, [facility] Clinical Pharmacist 06/12/2019       10:14 PM

## 2019-06-12 NOTE — Progress Notes (Signed)
ANTICOAGULATION CONSULT NOTE - Follow Up Consult  Pharmacy Consult for heparin Indication: NSTEMI  Labs: Recent Labs    06/10/19 1515 06/10/19 1900 06/11/19 0036 06/11/19 0543 06/11/19 0629 06/11/19 1209 06/12/19 0318  HGB 12.3  --  12.1  --   --   --  10.7*  HCT 39.1  --  37.0  --   --   --  33.5*  PLT 177  --  173  --   --   --  156  APTT 25  --   --   --   --   --   --   LABPROT 13.7  --   --   --   --   --   --   INR 1.1  --   --   --   --   --   --   HEPARINUNFRC  --   --  0.57 0.42  --   --  0.26*  CREATININE 1.04*  --  1.19*  --  1.30*  --   --   TROPONINIHS 86* 802*  --   --  12,648* 20,461*  --      Assessment: 68yo female now subtherapeutic on heparin after two levels at goal though had been trending down; no gtt issues or overt signs of bleeding per RN, Hgb down ~1.5 points.  Goal of Therapy:  Heparin level 0.3-0.7 units/ml   Plan:  Will increase heparin gtt by 1 unit/kg/hr to 1000 units/hr and check level in 12 hours.    Wynona Neat, PharmD, BCPS  06/12/2019,4:15 AM

## 2019-06-12 NOTE — H&P (View-Only) (Signed)
Progress Note  Patient Name: [name] Date of Encounter: 06/12/2019  Primary Cardiologist:   Minus Breeding, MD   Subjective   Chest pain last night.  None currently.  No SOB.    Inpatient Medications    Scheduled Meds: . aspirin EC  81 mg Oral Daily  . Chlorhexidine Gluconate Cloth  6 each Topical Q0600  . insulin aspart  0-15 Units Subcutaneous TID WC  . mouth rinse  15 mL Mouth Rinse BID  . metoprolol succinate  50 mg Oral Daily  . mupirocin ointment  1 application Nasal BID   Continuous Infusions: . sodium chloride 250 mL (06/11/19 [id number])  . ceFEPime (MAXIPIME) IV 2 g (06/11/19 2218)  . heparin 1,000 Units/hr (06/12/19 0432)  . nitroGLYCERIN 25 mcg/min (06/12/19 0400)  . vancomycin 750 mg (06/11/19 1734)   PRN Meds: sodium chloride, diphenhydrAMINE, nitroGLYCERIN, ondansetron (ZOFRAN) IV   Vital Signs    Vitals:   06/11/19 1925 06/11/19 2300 06/12/19 0004 06/12/19 0333  BP: (!) 115/59 (!) 113/54  129/61  Pulse: 77 75 76 78  Resp: 17 19 19 18   Temp: 98.9 F (37.2 C) 99.1 F (37.3 C)  98.1 F (36.7 C)  TempSrc: Oral Oral  Axillary  SpO2: 90% 93% 94% 97%  Weight:    92.9 kg  Height:        Intake/Output Summary (Last 24 hours) at 06/12/2019 0751 Last data filed at 06/12/2019 0400 Gross per 24 hour  Intake 359.42 ml  Output -  Net 359.42 ml   Filed Weights   06/10/19 2248 06/11/19 0451 06/12/19 0333  Weight: 93.5 kg 94.8 kg 92.9 kg    Telemetry    NSR - Personally Reviewed  ECG    NA - Personally Reviewed  Physical Exam   GEN: No acute distress.   Neck: No  JVD Cardiac: RRR, no murmurs, rubs, or gallops.  Respiratory:   Decreased crackles at the bases left greater than right GI: Soft, nontender, non-distended  MS: No  edema; No deformity. Neuro:  Nonfocal  Psych: Normal affect   Labs    Chemistry Recent Labs  Lab 06/11/19 0036 06/11/19 0629 06/12/19 0318  NA 138 137 138  K 4.9 4.5 4.3  CL 106 105 108  CO2 20* 20* 21*   GLUCOSE 190* 233* 179*  BUN 35* 38* 33*  CREATININE 1.19* 1.30* 1.18*  CALCIUM 9.2 8.9 9.1  PROT 7.0 6.7 6.6  ALBUMIN 3.6 3.2* 3.1*  AST 329* 343* 403*  ALT 271* 258* 247*  ALKPHOS 108 104 101  BILITOT 2.4* 3.1* 2.5*  GFRNONAA 47* 42* 47*  GFRAA 54* 49* 55*  ANIONGAP 12 12 9      Hematology Recent Labs  Lab 06/10/19 1515 06/11/19 0036 06/12/19 0318  WBC 8.1 9.7 7.6  RBC 4.12 3.93 3.57*  HGB 12.3 12.1 10.7*  HCT 39.1 37.0 33.5*  MCV 94.9 94.1 93.8  MCH 29.9 30.8 30.0  MCHC 31.5 32.7 31.9  RDW 14.8 15.0 15.3  PLT 177 173 156    Cardiac EnzymesNo results for input(s): TROPONINI in the last 168 hours. No results for input(s): TROPIPOC in the last 168 hours.   BNP Recent Labs  Lab 06/10/19 1515 06/10/19 2036  BNP 86.1 250.1*     DDimer No results for input(s): DDIMER in the last 168 hours.   Radiology    Dg Chest Port 1 View  Result Date: 06/10/2019 CLINICAL DATA:  Generalized weakness and vomiting. EXAM: PORTABLE CHEST 1  VIEW COMPARISON:  May 17, 2018 FINDINGS: Cardiomediastinal silhouette is normal. Mediastinal contours appear intact. Calcific atherosclerotic disease of the aorta. There is no evidence of focal airspace consolidation, pleural effusion or pneumothorax. Osseous structures are without acute abnormality. Postsurgical changes in the bilateral chest wall. IMPRESSION: No active disease. Electronically Signed   By: Fidela Salisbury M.D.   On: 06/10/2019 17:07   Dg Hips Bilat W Or Wo Pelvis 3-4 Views  Result Date: 06/10/2019 CLINICAL DATA:  LEFT hip pain EXAM: DG HIP (WITH OR WITHOUT PELVIS) 3-4V BILAT COMPARISON:  Pelvic radiograph 03/31/2012 FINDINGS: Osseous demineralization. Advanced osteoarthritic changes of both hip joints with joint space narrowing and spur formation, significantly progressive since prior study. SI joints preserved. No acute fracture, dislocation, or bone destruction. IMPRESSION: Advanced osteoarthritic changes of both hips,  progressive since 2013. Osseous demineralization. Electronically Signed   By: Lavonia Dana M.D.   On: 06/10/2019 17:54   US Abdomen Limited Ruq  Result Date: 06/10/2019 CLINICAL DATA:  Transaminitis. EXAM: ULTRASOUND ABDOMEN LIMITED RIGHT UPPER QUADRANT COMPARISON:  Chest, abdomen and pelvis CT dated 04/20/2018. FINDINGS: Gallbladder: No gallstones or wall thickening visualized. No sonographic Murphy sign noted by sonographer. Common bile duct: Diameter: 5.0 mm Liver: Diffusely echogenic. Corresponding diffuse low density on the previous CT. Portal vein is patent on color Doppler imaging with normal direction of blood flow towards the liver. Other: None. IMPRESSION: 1. Diffuse hepatic steatosis. 2. Otherwise, normal examination. Electronically Signed   By: Claudie Revering M.D.   On: 06/10/2019 18:05    Cardiac Studies   ECHO:  1. The left ventricle has mildly reduced systolic function, with an ejection fraction of 45-50%. The cavity size was normal. Left ventricular diastolic Doppler parameters are consistent with pseudonormalization. Elevated left ventricular end-diastolic pressure Left ventricular diffuse hypokinesis. 2. The right ventricle has normal systolic function. The cavity was normal. There is no increase in right ventricular wall thickness. 3. There is mild to moderate mitral annular calcification present. 4. The aortic valve is tricuspid. Mild sclerosis of the aortic valve. Aortic valve regurgitation is trivial by color flow Doppler. 5. The aorta is normal unless otherwise noted.  Patient Profile     68 y.o. female with a history of three-vessel coronary disease, medically managed, HFpEF, diabetes, hypertension, hyperlipidemia, breast cancer status post chemo and radiation, obesity, who is being seen for the evaluation of chest pain and elevated troponin at the request of Dr. Billy Fischer.  She has a history of 3 vessel CAD and did not want CABG.   Assessment & Plan    NSTEMI:  Plan  heart cath.    Note EF was slightly reduced as above.   On IV Heparin.  NTG IV.  Plan for cath tomorrow.    ASCAD:  See above.   HTN:  BP controlled.  Continue current therapy.    DYSLIPIDEMIA:  Statins on hold with elevated liver enzymes.    FEVER:   On empiric antibiotics.   Afebrile.  Plan per primary team.   Liver enzymes remain elevated.     ELEVATED LIVER ENZYMES:  I would not suspect that this is related to her MI as it continues to rise.  I would not suggest passive hepatic congestion as she does not otherwise have overt right sided heart failure.  I will defer further imaging work up to primary team.     For questions or updates, please contact [place] Please consult [email or web address] for contact info under Cardiology/STEMI.   Signed, Jeneen Rinks  Carmelo Reidel, MD  06/12/2019, 7:51 AM

## 2019-06-12 NOTE — Progress Notes (Signed)
ANTICOAGULATION CONSULT NOTE - Follow Up Consult  Pharmacy Consult for heparin Indication: chest pain/ACS  Patient Measurements: Heparin Dosing Weight: 70 kg  Vital Signs: Temp: 98.7 F (37.1 C) (09/08 1108) Temp Source: Oral (09/08 1108) BP: 121/61 (09/08 1108) Pulse Rate: 71 (09/08 1108)  Labs: Recent Labs    06/10/19 1515 06/10/19 1900 06/11/19 0036 06/11/19 0543 06/11/19 0629 06/11/19 1209 06/12/19 0318  HGB 12.3  --  12.1  --   --   --  10.7*  HCT 39.1  --  37.0  --   --   --  33.5*  PLT 177  --  173  --   --   --  156  APTT 25  --   --   --   --   --   --   LABPROT 13.7  --   --   --   --   --   --   INR 1.1  --   --   --   --   --   --   HEPARINUNFRC  --   --  0.57 0.42  --   --  0.26*  CREATININE 1.04*  --  1.19*  --  1.30*  --  1.18*  TROPONINIHS 86* 802*  --   --  12,648* 20,461*  --     Estimated Creatinine Clearance: 47.4 mL/min (A) (by C-G formula based on SCr of 1.18 mg/dL (H)).   Medical History: Past Medical History:  Diagnosis Date  . Arthritis    "knees, lower back" (08/18/2017)  . 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 S/p lumpectomy and radiation.  Marland Kitchen CAD (coronary artery disease) 11/07   cath 11/11 :  3V CADsee report   . Cataract    small both eyes  . Chronic lower back pain   . Chronic pain    MR 08/07/10 :  Spondylosis L4-5 with B foraminal narrowing and L4 nerve root enchroachment B.  . Chronic venous insufficiency 2012   Has had full W/U incl ECHO, LFT's, Cr, Umicro, TSH, BNP. Symptomatic treatment.  . Colonic polyp 1995   Colonoscopies 1994, 2000, and 02/02/2011. Last had 9 polyps - Tubular adenoma and tubulovillous was the pathology results without high grade dysplasia  . Diabetes mellitus type II    Insulin dependent. Has worked with [name] R previously.  . Diastolic CHF, chronic ([place])    NL EF by echo 01/2012, Gr I dd  . Essential hypertension    Requires 5 drug therapy and still  difficult to control.  Marland Kitchen GERD (gastroesophageal reflux disease)   . Heart murmur   . History of radiation therapy 10/02/18- 11/09/18   Left chest wall SCV, Left chest wall boost, Right chest wall SCV, right chest boost all received 28 fractions for a total dose of 50.04 Gy  . Hyperlipidemia    On daily statin  . Iron deficiency anemia    Ferritin was 12 in 2012. on FeSo4. Has had colon and EGD 02/02/11 that showed mild gastritis and colon polyps.  . Obesity    Morbid. HAs worked with [name] T previously.  . OSA (obstructive sleep apnea) 02/16/2007   02/2007 : Moderate AHI 35.6, O2 sat decreased to 65%, CPAP titration to 16 with AHI of 3.6. 02/2014 : Rare respiratory events with sleep disturbance, within normal limits. AHI 0.4 per hour      . OSA on CPAP   . Personal  history of chemotherapy   . Personal history of radiation therapy    "to my left breast"  . Refusal of blood transfusions as patient is Jehovah's Witness   . Seasonal allergies    "grass pollen"    Assessment: 68 yo female presented on 06/10/2019 for chest pain in setting of known 3-vessel CAD found to have NSTEMI. Pharmacy consulted to dose heparin for ACS. Troponin increased from 86 on admission to 20,461. Plan for cardiac cath on 9/9.    Heparin level 0.21 subtherapeutic on heparin 1000 units/hr. Per nursing, rate running at 1000 units/hr, no IV line access issues, and no reported bleeding. Slight decrease in Hgb from 12.1 to 10.7. Plt wnl.    Goal of Therapy:  Heparin level 0.3-0.7 units/ml Monitor platelets by anticoagulation protocol: Yes    Plan:  Increase heparin to 1150 units/hr  Check heparin level at 2030 on 9/8 Monitor heparin level, CBC, and S/S of bleeding  Follow up anticoagulations plans after cardiac cath on 9/9  Cristela Felt, PharmD PGY1 Pharmacy Resident [employer]: [phone]  06/12/2019       12:30 PM

## 2019-06-12 NOTE — Progress Notes (Signed)
ANTICOAGULATION CONSULT NOTE - Follow Up Consult  Pharmacy Consult for heparin Indication: chest pain/ACS  Patient Measurements: Heparin Dosing Weight: 70 kg  Vital Signs: Temp: 99.5 F (37.5 C) (09/08 1855) Temp Source: Oral (09/08 1855) BP: 116/63 (09/08 1900) Pulse Rate: 80 (09/08 1900)  Labs: Recent Labs    06/10/19 1515 06/10/19 1900 06/11/19 0036  06/11/19 0629 06/11/19 1209 06/12/19 0318 06/12/19 1302 06/12/19 2035  HGB 12.3  --  12.1  --   --   --  10.7*  --   --   HCT 39.1  --  37.0  --   --   --  33.5*  --   --   PLT 177  --  173  --   --   --  156  --   --   APTT 25  --   --   --   --   --   --   --   --   LABPROT 13.7  --   --   --   --   --   --   --   --   INR 1.1  --   --   --   --   --   --   --   --   HEPARINUNFRC  --   --  0.57   < >  --   --  0.26* 0.21* 0.11*  CREATININE 1.04*  --  1.19*  --  1.30*  --  1.18*  --   --   TROPONINIHS 86* 802*  --   --  12,648* 20,461*  --   --   --    < > = values in this interval not displayed.    Estimated Creatinine Clearance: 47.4 mL/min (A) (by C-G formula based on SCr of 1.18 mg/dL (H)).   Medical History: Past Medical History:  Diagnosis Date  . Arthritis    "knees, lower back" (08/18/2017)  . 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 S/p lumpectomy and radiation.  Marland Kitchen CAD (coronary artery disease) 11/07   cath 11/11 :  3V CADsee report   . Cataract    small both eyes  . Chronic lower back pain   . Chronic pain    MR 08/07/10 :  Spondylosis L4-5 with B foraminal narrowing and L4 nerve root enchroachment B.  . Chronic venous insufficiency 2012   Has had full W/U incl ECHO, LFT's, Cr, Umicro, TSH, BNP. Symptomatic treatment.  . Colonic polyp 1995   Colonoscopies 1994, 2000, and 02/02/2011. Last had 9 polyps - Tubular adenoma and tubulovillous was the pathology results without high grade dysplasia  . Diabetes mellitus type II    Insulin dependent. Has worked  with [name] R previously.  . Diastolic CHF, chronic ([place])    NL EF by echo 01/2012, Gr I dd  . Essential hypertension    Requires 5 drug therapy and still difficult to control.  Marland Kitchen GERD (gastroesophageal reflux disease)   . Heart murmur   . History of radiation therapy 10/02/18- 11/09/18   Left chest wall SCV, Left chest wall boost, Right chest wall SCV, right chest boost all received 28 fractions for a total dose of 50.04 Gy  . Hyperlipidemia    On daily statin  . Iron deficiency anemia    Ferritin was 12 in 2012. on FeSo4. Has had colon and EGD 02/02/11 that showed mild gastritis and colon polyps.  Marland Kitchen  Obesity    Morbid. HAs worked with [name] T previously.  . OSA (obstructive sleep apnea) 02/16/2007   02/2007 : Moderate AHI 35.6, O2 sat decreased to 65%, CPAP titration to 16 with AHI of 3.6. 02/2014 : Rare respiratory events with sleep disturbance, within normal limits. AHI 0.4 per hour      . OSA on CPAP   . Personal history of chemotherapy   . Personal history of radiation therapy    "to my left breast"  . Refusal of blood transfusions as patient is Jehovah's Witness   . Seasonal allergies    "grass pollen"    Assessment: 68 yo female presented on 06/10/2019 for chest pain in setting of known 3-vessel CAD found to have NSTEMI. Pharmacy consulted to dose heparin for ACS. Troponin increased from 86 on admission to 20,461. Plan for cardiac cath on 9/9.    Heparin level was 0.21 subtherapeutic on heparin 1000 units/hr, so heparin infusion was increased to 1150 units/hr. Per nursing at that time,  no IV line access issues, and no reported bleeding. Slight decrease in Hgb from 12.1 to 10.7. Plt wnl.    Heparin level ~6.5 hrs after increasing heparin infusion rate to 1150 units/hr was lower (0.11 units/ml). Per RN and pt, IV has been beeping a lot over past few hours, which helps explain lower heparin level despite increase in infusion rate. Per RN, no signs/sx of bleeding.  Goal of Therapy:   Heparin level 0.3-0.7 units/ml Monitor platelets by anticoagulation protocol: Yes    Plan:  Increase heparin to 1300 units/hr (more cautious increase, due to issues with IV) Check 6-hr heparin level Monitor daily heparin level, CBC, and signs/sx of bleeding  Follow up anticoagulations plans after cardiac cath on 9/9  Gillermina Hu, PharmD, BCPS, [facility] Clinical Pharmacist 06/12/2019       10:19 PM

## 2019-06-12 NOTE — Progress Notes (Addendum)
Subjective: Pt seen at the bedside on rounds this AM. Pt denies SOB or chest pain. She said the Nitro drip was helpful yesterday. No complaints today.   Objective: Echocardiogram (9/7): IMPRESSIONS 1. The left ventricle has mildly reduced systolic function, with an ejection fraction of 45-50%. The cavity size was normal. Left ventricular diastolic Doppler parameters are consistent with pseudonormalization. Elevated left ventricular end-diastolic  pressure Left ventricular diffuse hypokinesis. 2. The right ventricle has normal systolic function. The cavity was normal. There is no increase in right ventricular wall thickness. 3. There is mild to moderate mitral annular calcification present. 4. The aortic valve is tricuspid. Mild sclerosis of the aortic valve. Aortic valve regurgitation is trivial by color flow Doppler. 5. The aorta is normal unless otherwise noted.  Vital signs in last 24 hours: Vitals:   06/11/19 1925 06/11/19 2300 06/12/19 0004 06/12/19 0333  BP: (!) 115/59 (!) 113/54  129/61  Pulse: 77 75 76 78  Resp: 17 19 19 18   Temp: 98.9 F (37.2 C) 99.1 F (37.3 C)  98.1 F (36.7 C)  TempSrc: Oral Oral  Axillary  SpO2: 90% 93% 94% 97%  Weight:    92.9 kg  Height:       Physical Exam: General: Obese appearing female laying in bed comfortably, NAD HEENT: NCAT CV: RRR, normal S1 and S2.  No murmurs, rubs or gallops appreciated PULM: Clear to auscultation bilaterally.  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 is a 68 year old Jehovah Witness with a hx of three-vessel coronary disease, HFpEF, T2DM, HTN, HLD, breast cancer status post bilateral mastectomy with chemo and  radiation (last radiation tx Feburary 2020), and obesitywho presented with NSTEMI, fever, and transaminitis.   #Chest pain #CAD #NSTEMI: Pt had an echo yesterday which showed EF of 45-50% and mild aortic and mitral valve calcifications. Cath from 2018 shows severe multivessel disease but it was decided to medically manage her at the time.Her EKG on admission showed ST depressions in the lateral leads. Troponins have uptrended to 20,461 after initial value of 86. Today, the patient's chest pain is well controlled. -Cardiology consulted, will appreciate recommendations:             - Plan to perform heart catheterization tomorrow 9/9 - Continue nitroglycerin drip - Nitroglycerin tablets 0.4mg  q1min PRN - Heparin drip  - ASA 81 mg daily   #Transaminitis: The patient presented with transaminitis and her AST/ALT is 403/247. At this time, we are still considering ischemic disease secondary to NSTEMI, hepatitis or DILI. RUQ u/s was performed and showed diffuse hepatic steatosis, but no evidence of thrombosis or cholestasis. It is unclear what the root cause is at this time. We will continue to work this up. - Viral hepatitis panel is currently pending   #Fever: Etiology uncertain.Tmax 102 on admission. This in the setting of transaminitis is concerning. The patient continues to not have leukocytosis, but did have elevated sed rate & CRP to 42 and 11.4 respectively which could suggest the presence of infection. The patient does not have SOB, productive cough, abdominal pain, urinary complaints, or new skin lesions that would suggest she has an infectious source.  - F/u blood cultures (negative to date) - Continue  vanc and cefepime   #T2DM - SSI   #FEN/GI - Diet: Carb modified - IV Fluids - no  #Dispo: Pending medical course.    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  Earlene Plater, MD Internal Medicine, PGY1 Pager: [phone]  06/12/2019,10:55 AM

## 2019-06-12 NOTE — Plan of Care (Signed)
  Problem: Education: ?Goal: Knowledge of General Education information will improve ?Description: Including pain rating scale, medication(s)/side effects and non-pharmacologic comfort measures ?Outcome: Progressing ?  ?Problem: Clinical Measurements: ?Goal: Respiratory complications will improve ?Outcome: Progressing ?  ?Problem: Clinical Measurements: ?Goal: Cardiovascular complication will be avoided ?Outcome: Progressing ?  ?Problem: Activity: ?Goal: Risk for activity intolerance will decrease ?Outcome: Progressing ?  ?Problem: Nutrition: ?Goal: Adequate nutrition will be maintained ?Outcome: Progressing ?  ?Problem: Coping: ?Goal: Level of anxiety will decrease ?Outcome: Progressing ?  ?Problem: Pain Managment: ?Goal: General experience of comfort will improve ?Outcome: Progressing ?  ?

## 2019-06-13 ENCOUNTER — Other Ambulatory Visit: Payer: Self-pay | Admitting: *Deleted

## 2019-06-13 ENCOUNTER — Encounter (HOSPITAL_COMMUNITY): Payer: Self-pay | Admitting: Cardiovascular Disease

## 2019-06-13 ENCOUNTER — Encounter (HOSPITAL_COMMUNITY)
Admission: EM | Disposition: A | Discharge status: Home Health | Payer: Self-pay | Source: Home / Self Care | Attending: Student in an Organized Health Care Education/Training Program

## 2019-06-13 DIAGNOSIS — G629 Polyneuropathy, unspecified: Secondary | ICD-10-CM

## 2019-06-13 DIAGNOSIS — M25552 Pain in left hip: Secondary | ICD-10-CM

## 2019-06-13 HISTORY — PX: CORONARY STENT INTERVENTION: CATH118234

## 2019-06-13 HISTORY — PX: LEFT HEART CATH AND CORONARY ANGIOGRAPHY: CATH118249

## 2019-06-13 LAB — POCT ACTIVATED CLOTTING TIME
Activated Clotting Time: 334 seconds
Activated Clotting Time: 411 seconds

## 2019-06-13 LAB — BASIC METABOLIC PANEL
Anion gap: 7 (ref 5–15)
BUN: 18 mg/dL (ref 8–23)
CO2: 21 mmol/L — ABNORMAL LOW (ref 22–32)
Calcium: 8.8 mg/dL — ABNORMAL LOW (ref 8.9–10.3)
Chloride: 110 mmol/L (ref 98–111)
Creatinine, Ser: 0.93 mg/dL (ref 0.44–1.00)
GFR calc Af Amer: >60 mL/min (ref >60)
GFR calc non Af Amer: >60 mL/min (ref >60)
Glucose, Bld: 167 mg/dL — ABNORMAL HIGH (ref 70–99)
Potassium: 4.3 mmol/L (ref 3.5–5.1)
Sodium: 138 mmol/L (ref 135–145)

## 2019-06-13 LAB — CBC
HCT: 31.4 % — ABNORMAL LOW (ref 36.0–46.0)
Hemoglobin: 10.2 g/dL — ABNORMAL LOW (ref 12.0–15.0)
MCH: 30.4 pg (ref 26.0–34.0)
MCHC: 32.5 g/dL (ref 30.0–36.0)
MCV: 93.5 fL (ref 80.0–100.0)
Platelets: 154 10*3/uL (ref 150–400)
RBC: 3.36 MIL/uL — ABNORMAL LOW (ref 3.87–5.11)
RDW: 15.1 % (ref 11.5–15.5)
WBC: 6.8 10*3/uL (ref 4.0–10.5)
nRBC: 0 % (ref 0.0–0.2)

## 2019-06-13 LAB — HEPARIN LEVEL (UNFRACTIONATED): Heparin Unfractionated: 0.28 IU/mL — ABNORMAL LOW (ref 0.30–0.70)

## 2019-06-13 LAB — GLUCOSE, CAPILLARY
Glucose-Capillary: 157 mg/dL — ABNORMAL HIGH (ref 70–99)
Glucose-Capillary: 121 mg/dL — ABNORMAL HIGH (ref 70–99)
Glucose-Capillary: 130 mg/dL — ABNORMAL HIGH (ref 70–99)

## 2019-06-13 SURGERY — LEFT HEART CATH AND CORONARY ANGIOGRAPHY
Anesthesia: LOCAL

## 2019-06-13 MED ORDER — TICAGRELOR 90 MG PO TABS
90.0000 mg | ORAL_TABLET | Freq: Two times a day (BID) | ORAL | Status: DC
  Administered 2019-06-13 – 2019-06-14 (×2): 90 mg via ORAL
  Filled 2019-06-13 (×2): qty 1

## 2019-06-13 MED ORDER — HYDRALAZINE HCL 20 MG/ML IJ SOLN: 10.0000 mg | INTRAMUSCULAR | Status: AC | PRN

## 2019-06-13 MED ORDER — LABETALOL HCL 5 MG/ML IV SOLN: 10.0000 mg | INTRAVENOUS | Status: AC | PRN

## 2019-06-13 MED ORDER — SODIUM CHLORIDE 0.9 % IV SOLN: 250.0000 mL | INTRAVENOUS | Status: DC | PRN

## 2019-06-13 MED ORDER — MIDAZOLAM HCL 2 MG/2ML IJ SOLN
INTRAMUSCULAR | Status: AC
  Filled 2019-06-13: qty 2

## 2019-06-13 MED ORDER — TIROFIBAN HCL IN NACL 5-0.9 MG/100ML-% IV SOLN
INTRAVENOUS | Status: DC | PRN
  Administered 2019-06-13: 0.15 ug/kg/min via INTRAVENOUS

## 2019-06-13 MED ORDER — IOHEXOL 350 MG/ML SOLN
INTRAVENOUS | Status: DC | PRN
  Administered 2019-06-13: 13:00:00 200 mL via INTRA_ARTERIAL

## 2019-06-13 MED ORDER — HEPARIN SODIUM (PORCINE) 1000 UNIT/ML IJ SOLN
INTRAMUSCULAR | Status: AC
  Filled 2019-06-13: qty 1

## 2019-06-13 MED ORDER — HEPARIN (PORCINE) IN NACL 1000-0.9 UT/500ML-% IV SOLN
INTRAVENOUS | Status: DC | PRN
  Administered 2019-06-13 (×3): 500 mL

## 2019-06-13 MED ORDER — FENTANYL CITRATE (PF) 100 MCG/2ML IJ SOLN
INTRAMUSCULAR | Status: AC
  Filled 2019-06-13: qty 2

## 2019-06-13 MED ORDER — TIROFIBAN (AGGRASTAT) BOLUS VIA INFUSION
INTRAVENOUS | Status: DC | PRN
  Administered 2019-06-13: 12:00:00 2322.5 ug via INTRAVENOUS

## 2019-06-13 MED ORDER — HEPARIN SODIUM (PORCINE) 1000 UNIT/ML IJ SOLN
INTRAMUSCULAR | Status: DC | PRN
  Administered 2019-06-13 (×2): 5000 [IU] via INTRAVENOUS

## 2019-06-13 MED ORDER — TICAGRELOR 90 MG PO TABS
ORAL_TABLET | ORAL | Status: DC | PRN
  Administered 2019-06-13: 180 mg via ORAL

## 2019-06-13 MED ORDER — FUROSEMIDE 10 MG/ML IJ SOLN
40.0000 mg | Freq: Two times a day (BID) | INTRAMUSCULAR | Status: DC
  Administered 2019-06-13 – 2019-06-14 (×2): 40 mg via INTRAVENOUS
  Filled 2019-06-13 (×2): qty 4

## 2019-06-13 MED ORDER — LIDOCAINE HCL (PF) 1 % IJ SOLN
INTRAMUSCULAR | Status: AC
  Filled 2019-06-13: qty 30

## 2019-06-13 MED ORDER — FENTANYL CITRATE (PF) 100 MCG/2ML IJ SOLN
INTRAMUSCULAR | Status: DC | PRN
  Administered 2019-06-13 (×2): 25 ug via INTRAVENOUS

## 2019-06-13 MED ORDER — SODIUM CHLORIDE 0.9% FLUSH: 3.0000 mL | INTRAVENOUS | Status: DC | PRN

## 2019-06-13 MED ORDER — VERAPAMIL HCL 2.5 MG/ML IV SOLN
INTRAVENOUS | Status: AC
  Filled 2019-06-13: qty 2

## 2019-06-13 MED ORDER — TIROFIBAN HCL IN NACL 5-0.9 MG/100ML-% IV SOLN
INTRAVENOUS | Status: AC
  Filled 2019-06-13: qty 100

## 2019-06-13 MED ORDER — GABAPENTIN 300 MG PO CAPS: 300.0000 mg | ORAL_CAPSULE | Freq: Every day | ORAL | 0 refills | Status: DC

## 2019-06-13 MED ORDER — MIDAZOLAM HCL 2 MG/2ML IJ SOLN
INTRAMUSCULAR | Status: DC | PRN
  Administered 2019-06-13 (×2): 1 mg via INTRAVENOUS

## 2019-06-13 MED ORDER — SODIUM CHLORIDE 0.9 % IV SOLN: INTRAVENOUS | Status: AC

## 2019-06-13 MED ORDER — TICAGRELOR 90 MG PO TABS
ORAL_TABLET | ORAL | Status: AC
  Filled 2019-06-13: qty 2

## 2019-06-13 MED ORDER — HEPARIN (PORCINE) IN NACL 1000-0.9 UT/500ML-% IV SOLN
INTRAVENOUS | Status: AC
  Filled 2019-06-13: qty 500

## 2019-06-13 MED ORDER — LIDOCAINE HCL (PF) 1 % IJ SOLN
INTRAMUSCULAR | Status: DC | PRN
  Administered 2019-06-13: 2 mL

## 2019-06-13 MED ORDER — VERAPAMIL HCL 2.5 MG/ML IV SOLN
INTRAVENOUS | Status: DC | PRN
  Administered 2019-06-13: 10 mL via INTRA_ARTERIAL

## 2019-06-13 MED ORDER — HEPARIN (PORCINE) IN NACL 1000-0.9 UT/500ML-% IV SOLN
INTRAVENOUS | Status: AC
  Filled 2019-06-13: qty 1000

## 2019-06-13 MED ORDER — SODIUM CHLORIDE 0.9% FLUSH
3.0000 mL | Freq: Two times a day (BID) | INTRAVENOUS | Status: DC
  Administered 2019-06-13 – 2019-06-14 (×2): 3 mL via INTRAVENOUS

## 2019-06-13 SURGICAL SUPPLY — 19 items
BALLN SAPPHIRE 2.0X12 (BALLOONS) ×2
BALLN SAPPHIRE ~~LOC~~ 2.0X15 (BALLOONS) ×1 IMPLANT
BALLN SAPPHIRE ~~LOC~~ 3.25X8 (BALLOONS) ×1 IMPLANT
BALLOON SAPPHIRE 2.0X12 (BALLOONS) IMPLANT
CATH 5FR JL3.5 JR4 ANG PIG MP (CATHETERS) ×1 IMPLANT
CATH VISTA GUIDE 6FR XBLAD3.5 (CATHETERS) ×1 IMPLANT
DEVICE RAD COMP TR BAND LRG (VASCULAR PRODUCTS) ×1 IMPLANT
ELECT DEFIB PAD ADLT CADENCE (PAD) ×1 IMPLANT
GLIDESHEATH SLEND SS 6F .021 (SHEATH) ×1 IMPLANT
GUIDELINER 6F (CATHETERS) ×1 IMPLANT
GUIDEWIRE INQWIRE 1.5J.035X260 (WIRE) IMPLANT
INQWIRE 1.5J .035X260CM (WIRE) ×2
KIT ENCORE 26 ADVANTAGE (KITS) ×1 IMPLANT
KIT HEART LEFT (KITS) ×2 IMPLANT
PACK CARDIAC CATHETERIZATION (CUSTOM PROCEDURE TRAY) ×2 IMPLANT
STENT SYNERGY DES 3X12 (Permanent Stent) ×1 IMPLANT
TRANSDUCER W/STOPCOCK (MISCELLANEOUS) ×2 IMPLANT
TUBING CIL FLEX 10 FLL-RA (TUBING) ×2 IMPLANT
WIRE COUGAR XT STRL 190CM (WIRE) ×2 IMPLANT

## 2019-06-13 NOTE — Progress Notes (Addendum)
   Subjective: Patient seen at the bedside on rounds this morning.  Patient's daughter is at the bedside.  Patient says she is not having any chest pain or shortness of breath this morning.  Her left hip is bothering her more than anything.  No other complaints at this time.  Objective:  Vital signs in last 24 hours: Vitals:   06/13/19 0000 06/13/19 0356 06/13/19 0639 06/13/19 0728  BP: 117/60 128/62  131/67  Pulse: 75 73  73  Resp: (!) 21 20  19   Temp:  98.6 F (37 C)  98.4 F (36.9 C)  TempSrc:    Oral  SpO2: 100% 95%  96%  Weight:   92.9 kg   Height:       Physical Exam: General: Comfortable appearing resting in bed, NAD HEENT: NCAT CV: Normal S1 and S2 appreciated, no murmurs rubs or gallops heard.  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   #Chest pain #CAD #NSTEMI: Pt had an echo yesterday which showedEF of 45-50% and mild aortic and mitral valve calcifications. Cath from 2018 shows severe multivessel disease but it was decided to medically manage her at the time.Her EKGon admissionshowed ST depressions in the lateral leads.Troponins have uptrended to 20,461 after initial value of 86. Today, the patient says she has no chest pain and is very comfortable.  -Cardiology consulted, will appreciate recommendations: - Plan to perform heart  catheterization today - Continue nitroglycerin drip - Nitroglycerin tablets 0.4mg  q70min PRN - Heparin drip  - ASA 81 mg daily  #Transaminitis: The patient presented with transaminitis. AST/ALT is 403/247. RUQ u/s was performed and showed diffuse hepatic steatosis, but no evidence of thrombosis or cholestasis. It is unclear what the root cause is at this time. We will continue to work this up. - Viral hepatitis panelis currently pending  #Fever: Etiology uncertain.Tmax 102 on admission. This in the setting of transaminitis is concerning. The patient continues to not have leukocytosis, but did have elevated sed rate &CRP to 42 and 11.4 respectively which could suggest the presence of infection. The patient does not have SOB, productive cough, abdominal pain, urinary complaints, or new skin lesions that would suggest she has an infectious source.  - F/u blood cultures (negative to date) -Continue vanc and cefepime (day 4). May d/c after catheterization   #L hip pain: Patient says her left hip pain is bothering her the most.  At this point, we will focus on working up her chest pain with cardiology and addressed her hip pain tomorrow. - May consider imaging studies tomorrow.  #T2DM - SSI  #FEN/GI - Diet: Carb modified - IV Fluids - none  #Dispo:Pending medical course.   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  Holly Plater, MD Internal Medicine, PGY1 Pager: [phone]  06/13/2019,1:03 PM

## 2019-06-13 NOTE — Progress Notes (Signed)
Patient to cath lab for procedure.

## 2019-06-13 NOTE — Care Management Important Message (Signed)
Important Message  Patient Details  Name: MONZERAT MICHAUX MRN: [id number] Date of Birth: [birth year]   Medicare Important Message Given:  Yes     Memory Argue 06/13/2019, 2:04 PM    PATIENT  DAUGHTER  SIGNED  IM FOR MOTHER

## 2019-06-13 NOTE — Progress Notes (Signed)
patinet with a small amount of bleeding under TR band with deflation of a total of 2 ccs.  Pausing for 45 minutes. Then begin deflation again.  Patient with good cap refill, sensation and no numbness, strong radial pulse

## 2019-06-13 NOTE — Interval H&P Note (Signed)
History and Physical Interval Note:  06/13/2019 11:40 AM  [name]  has presented today for surgery, with the diagnosis of nonstemi.  The various methods of treatment have been discussed with the patient and family. After consideration of risks, benefits and other options for treatment, the patient has consented to  Procedure(s): LEFT HEART CATH AND CORONARY ANGIOGRAPHY (N/A) as a surgical intervention.  The patient's history has been reviewed, patient examined, no change in status, stable for surgery.  I have reviewed the patient's chart and labs.  Questions were answered to the patient's satisfaction.    Cath Lab Visit (complete for each Cath Lab visit)  Clinical Evaluation Leading to the Procedure:   ACS: Yes.    Non-ACS:    Anginal Classification: CCS IV  Anti-ischemic medical therapy: Maximal Therapy (2 or more classes of medications)  Non-Invasive Test Results: No non-invasive testing performed  Prior CABG: No previous CABG         Lauree Chandler

## 2019-06-13 NOTE — Progress Notes (Signed)
Pharmacy Antibiotic Note  [name] is a 68 y.o. female admitted on 06/10/2019 with ACS and concern for sepsis.  Pharmacy has been consulted for vancomycin and cefepime dosing.  On day #4- WBC WNL, CRP 11.4, LA 2.2>1.4, afebrile. Scr 1.04>0.93 (CrCl 60 mL/min).   Plan: Continue with vancomycin 750mg  IV Q24H. Goal AUC 400-550.   Expected AUC 490.  SCr used 1.19.  Continue cefepime 2g IV Q12H. Monitor renal fx, cx results, clinical pic, plan for levels on 9/10 if vancomycin continued  Height: 5\' 1"  (154.9 cm) Weight: 204 lb 12.9 oz (92.9 kg) IBW/kg (Calculated) : 47.8  Temp (24hrs), Avg:98.7 F (37.1 C), Min:98.2 F (36.8 C), Max:99.5 F (37.5 C)  Recent Labs  Lab 06/10/19 1450 06/10/19 1515 06/10/19 2036 06/11/19 0036 06/11/19 0100 06/11/19 0423 06/11/19 0629 06/12/19 0318 06/13/19 0233  WBC  --  8.1  --  9.7  --   --   --  7.6 6.8  CREATININE  --  1.04*  --  1.19*  --   --  1.30* 1.18* 0.93  LATICACIDVEN 2.7*  --  2.2*  --  2.3* 1.4  --   --   --     Estimated Creatinine Clearance: 60.1 mL/min (by C-G formula based on SCr of 0.93 mg/dL).    Allergies  Allergen Reactions  . Blood-Group Specific Substance Other (See Comments)    NO BLOOD - JEHOVAH'S WITNESS  . Lisinopril Other (See Comments)    Chronic cough secondary to ACE  . Oxycodone-Acetaminophen Hives, Itching and Other (See Comments)    flushing  . Percocet [Oxycodone-Acetaminophen] Hives  . Victoza [Liraglutide] Other (See Comments)    Made her feel "out of it"  . Atorvastatin Other (See Comments)    myalgias    Thank you for allowing pharmacy to be a part of this patient's care.  Antibiotics This Admission:  Vanc 9/6>> Cefepime 9/6 >> Flagyl x 1 9/6  Microbiological Results: 9/6 BCx - ngtd 9/6 MRSA - neg 9/6 UCx - sent 9/6 COVID - neg  Antonietta Jewel, PharmD, BCCCP Clinical Pharmacist  Phone: [phone]  Please check AMION for all [place] phone numbers After 10:00 PM, call [place]  [phone] 06/13/2019 8:18 AM

## 2019-06-13 NOTE — Progress Notes (Signed)
ANTICOAGULATION CONSULT NOTE  Pharmacy Consult for Heparin Indication: chest pain/ACS  Patient Measurements: Heparin Dosing Weight: 70 kg  Vital Signs: Temp: 98.6 F (37 C) (09/09 0356) Temp Source: Oral (09/08 2353) BP: 128/62 (09/09 0356) Pulse Rate: 73 (09/09 0356)  Labs: Recent Labs    06/10/19 1515 06/10/19 1900 06/11/19 0036  06/11/19 0629 06/11/19 1209 06/12/19 0318 06/12/19 1302 06/12/19 2035 06/13/19 0233  HGB 12.3  --  12.1  --   --   --  10.7*  --   --  10.2*  HCT 39.1  --  37.0  --   --   --  33.5*  --   --  31.4*  PLT 177  --  173  --   --   --  156  --   --  154  APTT 25  --   --   --   --   --   --   --   --   --   LABPROT 13.7  --   --   --   --   --   --   --   --   --   INR 1.1  --   --   --   --   --   --   --   --   --   HEPARINUNFRC  --   --  0.57   < >  --   --  0.26* 0.21* 0.11* 0.28*  CREATININE 1.04*  --  1.19*  --  1.30*  --  1.18*  --   --   --   TROPONINIHS 86* 802*  --   --  12,648* 20,461*  --   --   --   --    < > = values in this interval not displayed.    Estimated Creatinine Clearance: 47.4 mL/min (A) (by C-G formula based on SCr of 1.18 mg/dL (H)).   Medical History: Past Medical History:  Diagnosis Date  . Arthritis    "knees, lower back" (08/18/2017)  . 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 S/p lumpectomy and radiation.  Marland Kitchen CAD (coronary artery disease) 11/07   cath 11/11 :  3V CADsee report   . Cataract    small both eyes  . Chronic lower back pain   . Chronic pain    MR 08/07/10 :  Spondylosis L4-5 with B foraminal narrowing and L4 nerve root enchroachment B.  . Chronic venous insufficiency 2012   Has had full W/U incl ECHO, LFT's, Cr, Umicro, TSH, BNP. Symptomatic treatment.  . Colonic polyp 1995   Colonoscopies 1994, 2000, and 02/02/2011. Last had 9 polyps - Tubular adenoma and tubulovillous was the pathology results without high grade dysplasia  . Diabetes mellitus  type II    Insulin dependent. Has worked with [name] R previously.  . Diastolic CHF, chronic ([place])    NL EF by echo 01/2012, Gr I dd  . Essential hypertension    Requires 5 drug therapy and still difficult to control.  Marland Kitchen GERD (gastroesophageal reflux disease)   . Heart murmur   . History of radiation therapy 10/02/18- 11/09/18   Left chest wall SCV, Left chest wall boost, Right chest wall SCV, right chest boost all received 28 fractions for a total dose of 50.04 Gy  . Hyperlipidemia    On daily statin  . Iron deficiency anemia    Ferritin was 12 in  2012. on FeSo4. Has had colon and EGD 02/02/11 that showed mild gastritis and colon polyps.  . Obesity    Morbid. HAs worked with [name] T previously.  . OSA (obstructive sleep apnea) 02/16/2007   02/2007 : Moderate AHI 35.6, O2 sat decreased to 65%, CPAP titration to 16 with AHI of 3.6. 02/2014 : Rare respiratory events with sleep disturbance, within normal limits. AHI 0.4 per hour      . OSA on CPAP   . Personal history of chemotherapy   . Personal history of radiation therapy    "to my left breast"  . Refusal of blood transfusions as patient is Jehovah's Witness   . Seasonal allergies    "grass pollen"    Assessment: 68 yo female presented on 06/10/2019 for chest pain in setting of known 3-vessel CAD found to have NSTEMI. Pharmacy consulted to dose heparin for ACS. Troponin increased from 86 on admission to 20,461. Plan for cardiac cath on 9/9.    9/9 AM update: heparin level low but trending up, no issues per RN  Goal of Therapy:  Heparin level 0.3-0.7 units/ml Monitor platelets by anticoagulation protocol: Yes    Plan:  Increase heparin to 1450 units/hr  Re-check heparin level in 8 hours Monitor heparin level, CBC, and S/S of bleeding  Follow up anticoagulations plans after cardiac cath on 9/9  Narda Bonds, PharmD, [place] Pharmacist Phone: [phone]

## 2019-06-13 NOTE — Progress Notes (Addendum)
Progress Note  Patient Name: [name] Date of Encounter: 06/13/2019  Primary Cardiologist: Minus Breeding, MD   Subjective   Currently CP free. Scheduled for cath today.   Inpatient Medications    Scheduled Meds: . aspirin EC  81 mg Oral Daily  . Chlorhexidine Gluconate Cloth  6 each Topical Q0600  . insulin aspart  0-15 Units Subcutaneous TID WC  . mouth rinse  15 mL Mouth Rinse BID  . metoprolol succinate  50 mg Oral Daily  . mupirocin ointment  1 application Nasal BID  . sodium chloride flush  3 mL Intravenous Q12H   Continuous Infusions: . sodium chloride 250 mL (06/11/19 [id number])  . sodium chloride    . sodium chloride 1 mL/kg/hr (06/13/19 0512)  . ceFEPime (MAXIPIME) IV 2 g (06/13/19 1005)  . heparin 1,450 Units/hr (06/13/19 0401)  . nitroGLYCERIN 25 mcg/min (06/13/19 0401)  . vancomycin 750 mg (06/12/19 1633)   PRN Meds: sodium chloride, sodium chloride, diphenhydrAMINE, nitroGLYCERIN, ondansetron (ZOFRAN) IV, sodium chloride flush   Vital Signs    Vitals:   06/13/19 0000 06/13/19 0356 06/13/19 0639 06/13/19 0728  BP: 117/60 128/62  131/67  Pulse: 75 73  73  Resp: (!) 21 20  19   Temp:  98.6 F (37 C)  98.4 F (36.9 C)  TempSrc:    Oral  SpO2: 100% 95%  96%  Weight:   92.9 kg   Height:        Intake/Output Summary (Last 24 hours) at 06/13/2019 1056 Last data filed at 06/13/2019 [id number] Gross per 24 hour  Intake 320.67 ml  Output 1200 ml  Net -879.33 ml   Last 3 Weights 06/13/2019 06/12/2019 06/11/2019  Weight (lbs) 204 lb 12.9 oz 204 lb 12.9 oz 208 lb 15.9 oz  Weight (kg) 92.9 kg 92.9 kg 94.8 kg      Telemetry    NSR 90s- Personally Reviewed  ECG    No new EKG to review today- Personally Reviewed  Physical Exam   GEN: No acute distress.   Neck: No JVD Cardiac: RRR, no murmurs, rubs, or gallops.  Respiratory: Clear to auscultation bilaterally. GI: Soft, nontender, non-distended  MS: No edema; No deformity. Neuro:  Nonfocal  Psych: Normal  affect   Labs    High Sensitivity Troponin:   Recent Labs  Lab 06/10/19 1515 06/10/19 1900 06/11/19 0629 06/11/19 1209  TROPONINIHS 86* 802* 12,648* 20,461*      Chemistry Recent Labs  Lab 06/11/19 0036 06/11/19 0629 06/12/19 0318 06/13/19 0233  NA 138 137 138 138  K 4.9 4.5 4.3 4.3  CL 106 105 108 110  CO2 20* 20* 21* 21*  GLUCOSE 190* 233* 179* 167*  BUN 35* 38* 33* 18  CREATININE 1.19* 1.30* 1.18* 0.93  CALCIUM 9.2 8.9 9.1 8.8*  PROT 7.0 6.7 6.6  --   ALBUMIN 3.6 3.2* 3.1*  --   AST 329* 343* 403*  --   ALT 271* 258* 247*  --   ALKPHOS 108 104 101  --   BILITOT 2.4* 3.1* 2.5*  --   GFRNONAA 47* 42* 47* >60  GFRAA 54* 49* 55* >60  ANIONGAP 12 12 9 7      Hematology Recent Labs  Lab 06/11/19 0036 06/12/19 0318 06/13/19 0233  WBC 9.7 7.6 6.8  RBC 3.93 3.57* 3.36*  HGB 12.1 10.7* 10.2*  HCT 37.0 33.5* 31.4*  MCV 94.1 93.8 93.5  MCH 30.8 30.0 30.4  MCHC 32.7 31.9 32.5  RDW  15.0 15.3 15.1  PLT 173 156 154    BNP Recent Labs  Lab 06/10/19 1515 06/10/19 2036  BNP 86.1 250.1*     DDimer No results for input(s): DDIMER in the last 168 hours.   Radiology    No results found.  Cardiac Studies   ECHO:  1. The left ventricle has mildly reduced systolic function, with an ejection fraction of 45-50%. The cavity size was normal. Left ventricular diastolic Doppler parameters are consistent with pseudonormalization. Elevated left ventricular end-diastolic pressure Left ventricular diffuse hypokinesis. 2. The right ventricle has normal systolic function. The cavity was normal. There is no increase in right ventricular wall thickness. 3. There is mild to moderate mitral annular calcification present. 4. The aortic valve is tricuspid. Mild sclerosis of the aortic valve. Aortic valve regurgitation is trivial by color flow Doppler. 5. The aorta is normal unless otherwise noted.  Patient Profile     68 y.o. female with a history of three-vessel  coronary disease, medically managed, HFpEF, diabetes, hypertension, hyperlipidemia, breast cancer status post chemo and radiation, obesity, who is being seen for the evaluation ofchest pain and elevated troponinat the request of Dr. Billy Fischer.  She has a history of 3 vessel CAD and did not want CABG.   Assessment & Plan    1. CAD/ NSTEMI: known 3V disease. Pt previously refused CABG. Now w/ significant hs troponin elevation c/w NSTEMI. 88>>802>>12K>>20K. Echo with mildly reduced LVEF, 45-50%  Plan LHC +/- PCI today.   Continue IV heparin  ASA 81  Metoprolol 50   No statin currently given elevated LFTs   2. HTN: BP controlled. 131/67  Continue metoprolol  Given LV dysfunction and NSTEMI, she would benefit from an ACE/ARB. Will initiate post cath  3. DLD: LDL elevated at 141. Goal given CAD < 70 mg.  No statins currently given abnormal LFTs  May be able to consider PCSK9i therapy post discharge  4. Fever: Etiology uncertain. Tmax 102 on admit. COVID negative. Blood cultures no growth to date. Afebrile today.  WBC WNL. On IV antibiotics Vanc + cefepime.  Further management and w/u per primary team   5. Elevated Liver Enzymes: do not suspect that this is related to her MI as it continues to rise.  Doubt passive hepatic congestion as she does not otherwise have overt right sided heart failure.    Will defer further imaging work up to primary team.      For questions or updates, please contact [place] Please consult [email or web address] for contact info under        Signed, Lyda Jester, PA-C  06/13/2019, 10:56 AM    I have personally seen and examined this patient. I agree with the assessment and plan as outlined above.  Plan for cardiac cath today.   Lauree Chandler 06/13/2019 3:42 PM

## 2019-06-14 DIAGNOSIS — Z9861 Coronary angioplasty status: Secondary | ICD-10-CM

## 2019-06-14 LAB — CBC
HCT: 31 % — ABNORMAL LOW (ref 36.0–46.0)
Hemoglobin: 10.5 g/dL — ABNORMAL LOW (ref 12.0–15.0)
MCH: 30.5 pg (ref 26.0–34.0)
MCHC: 33.9 g/dL (ref 30.0–36.0)
MCV: 90.1 fL (ref 80.0–100.0)
Platelets: 179 10*3/uL (ref 150–400)
RBC: 3.44 MIL/uL — ABNORMAL LOW (ref 3.87–5.11)
RDW: 14.9 % (ref 11.5–15.5)
WBC: 5.7 10*3/uL (ref 4.0–10.5)
nRBC: 0.5 % — ABNORMAL HIGH (ref 0.0–0.2)

## 2019-06-14 LAB — BASIC METABOLIC PANEL
Anion gap: 9 (ref 5–15)
BUN: 16 mg/dL (ref 8–23)
CO2: 20 mmol/L — ABNORMAL LOW (ref 22–32)
Calcium: 8.7 mg/dL — ABNORMAL LOW (ref 8.9–10.3)
Chloride: 107 mmol/L (ref 98–111)
Creatinine, Ser: 0.78 mg/dL (ref 0.44–1.00)
GFR calc Af Amer: >60 mL/min (ref >60)
GFR calc non Af Amer: >60 mL/min (ref >60)
Glucose, Bld: 116 mg/dL — ABNORMAL HIGH (ref 70–99)
Potassium: 4.7 mmol/L (ref 3.5–5.1)
Sodium: 136 mmol/L (ref 135–145)

## 2019-06-14 LAB — HEPATIC FUNCTION PANEL
ALT: 122 U/L — ABNORMAL HIGH (ref 0–44)
AST: 88 U/L — ABNORMAL HIGH (ref 15–41)
Albumin: 3.4 g/dL — ABNORMAL LOW (ref 3.5–5.0)
Alkaline Phosphatase: 92 U/L (ref 38–126)
Bilirubin, Direct: 0.3 mg/dL — ABNORMAL HIGH (ref 0.0–0.2)
Indirect Bilirubin: 0.7 mg/dL (ref 0.3–0.9)
Total Bilirubin: 1 mg/dL (ref 0.3–1.2)
Total Protein: 7.4 g/dL (ref 6.5–8.1)

## 2019-06-14 LAB — GLUCOSE, CAPILLARY
Glucose-Capillary: 140 mg/dL — ABNORMAL HIGH (ref 70–99)
Glucose-Capillary: 169 mg/dL — ABNORMAL HIGH (ref 70–99)
Glucose-Capillary: 141 mg/dL — ABNORMAL HIGH (ref 70–99)

## 2019-06-14 MED ORDER — ROSUVASTATIN CALCIUM 5 MG PO TABS: 5.0000 mg | ORAL_TABLET | Freq: Every day | ORAL | 11 refills | Status: DC

## 2019-06-14 MED ORDER — LOSARTAN POTASSIUM 50 MG PO TABS
50.0000 mg | ORAL_TABLET | Freq: Every day | ORAL | Status: DC
  Administered 2019-06-14: 50 mg via ORAL
  Filled 2019-06-14: qty 1

## 2019-06-14 MED ORDER — TICAGRELOR 90 MG PO TABS: 90.0000 mg | ORAL_TABLET | Freq: Two times a day (BID) | ORAL | 11 refills | Status: DC

## 2019-06-14 MED ORDER — ROSUVASTATIN CALCIUM 5 MG PO TABS
5.0000 mg | ORAL_TABLET | Freq: Every day | ORAL | Status: DC
  Administered 2019-06-14: 5 mg via ORAL
  Filled 2019-06-14: qty 1

## 2019-06-14 MED FILL — BRILINTA 90 MG TABLET: 90 | 30 days supply | Qty: 60 | Fill #0

## 2019-06-14 MED FILL — ROSUVASTATIN CALCIUM 5 MG T: 5 | 30 days supply | Qty: 30 | Fill #0

## 2019-06-14 NOTE — TOC Initial Note (Addendum)
Transition of Care [facility]) - Initial/Assessment Note    Patient Details  Name: [name] MRN: [id number] Date of Birth: 1951/04/18  Transition of [facility] CM/SW Contact:    Alberteen Sam, [place] Phone Number: [phone] 06/14/2019, 3:57 PM  Clinical Narrative:                  CSW contacted [employer] for patient's equipment needs for rolling walker, rollator, 3in1 and tub bench. Pending delivery to room for patient's discharge. Patient's daughter transporting home, no further needs identified.   *PENDING ORDERS FROM MD For above equipment   Expected Discharge Plan: [place] Barriers to Discharge: No Barriers Identified   Patient Goals and CMS Choice   CMS Medicare.gov Compare Post Acute Care list provided to:: Patient Choice offered to / list presented to : Patient  Expected Discharge Plan and Services Expected Discharge Plan: [place]       Living arrangements for the past 2 months: Single Family Home Expected Discharge Date: 06/14/19               DME Arranged: Gilford Rile rolling with seat, Tub bench, 3-N-1(Rollator) DME Agency: [employer] Date DME Agency Contacted: 06/14/19 Time DME Agency Contacted: 1600 Representative spoke with at DME Agency: [place] Arrangements/Services Living arrangements for the past 2 months: [place] with:: Self Patient language and need for interpreter reviewed:: Yes Do you feel safe going back to the place where you live?: Yes      Need for Family Participation in Patient Care: No (Comment) Care giver support system in place?: Yes (comment)   Criminal Activity/Legal Involvement Pertinent to Current Situation/Hospitalization: No - Comment as needed  Activities of Daily Living Home Assistive Devices/Equipment: None ADL Screening (condition at time of admission) Patient's cognitive ability adequate to safely complete daily activities?: Yes Is the patient deaf  or have difficulty hearing?: No Does the patient have difficulty seeing, even when wearing glasses/contacts?: No Does the patient have difficulty concentrating, remembering, or making decisions?: No Patient able to express need for assistance with ADLs?: Yes Does the patient have difficulty dressing or bathing?: Yes(not at baseline) Independently performs ADLs?: No Communication: Independent Dressing (OT): Needs assistance Is this a change from baseline?: Change from baseline, expected to last <3days Grooming: Needs assistance Is this a change from baseline?: Change from baseline, expected to last <3 days Feeding: Independent Bathing: Needs assistance Is this a change from baseline?: Change from baseline, expected to last <3 days Toileting: Independent with device (comment) In/Out Bed: Needs assistance Is this a change from baseline?: Change from baseline, expected to last <3 days Walks in Home: Independent Does the patient have difficulty walking or climbing stairs?: Yes Weakness of Legs: Left Weakness of Arms/Hands: None  Permission Sought/Granted                  Emotional Assessment Appearance:: Appears stated age Attitude/Demeanor/Rapport: Gracious Affect (typically observed): Calm Orientation: : Oriented to Self, Oriented to Place, Oriented to  Time, Oriented to Situation Alcohol / Substance Use: Not Applicable Psych Involvement: No (comment)  Admission diagnosis:  Transaminitis [R74.0] Patient Active Problem List   Diagnosis Date Noted  . Transaminitis 06/11/2019  . Fever, unspecified 06/11/2019  . NSTEMI (non-ST elevated myocardial infarction) ([place]) 06/10/2019  . 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 Bilateral  lower extremity edema 06/15/2018  . 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 Chronic diastolic heart failure ([place]) 06/15/2018  . Port-A-Cath in place 04/25/2018  . Hypertensive  retinopathy 09/09/2017  . Elevated TSH 08/15/2015  . Prolonged Q-T interval on ECG 08/14/2015  . Diabetic retinopathy ([place]) 03/26/2015  . Abnormality of gait 01/06/2013  . CTS (carpal tunnel syndrome) 03/09/2012  . Urinary frequency 07/14/2011  . Chronic venous insufficiency   . Routine health maintenance 03/09/2011  . CAD (coronary artery disease)   . Benign essential HTN   . Hyperlipidemia   . Colonic polyp   . Morbid obesity ([place])   . Chronic pain   . Malignant neoplasm of lower-inner quadrant of left breast in female, estrogen receptor positive ([place])   . Iron deficiency anemia   . Seasonal allergies   . OSA (obstructive sleep apnea) 02/16/2007  . DM type 2, uncontrolled, with retinopathy ([place]) 10/04/2004   PCP:  Bartholomew Crews, MD Pharmacy:   [facility], [place] - [place] [address] Phone: [phone] Fax: [phone]  [place] [address] [place]), [place] - [id number] [place]) [place] [address] Phone: [phone] Fax: [phone]  [place], [place] [address] Phone: [phone] Fax: [phone]     Social Determinants of Health (SDOH) Interventions    Readmission Risk Interventions No flowsheet data found.

## 2019-06-14 NOTE — Progress Notes (Signed)
Benefit check completed, NCM spoke with patient , [facility] pharmacy will be filling her medications and bring them to the room prior to dc.  Her copay is 3.90 thru Medicaid.

## 2019-06-14 NOTE — TOC Benefit Eligibility Note (Signed)
Transition of Care [facility]) Benefit Eligibility Note    Patient Details  Name: [name] MRN: [id number] Date of Birth: 03/18/1951   Medication/Dose: Kary Kos 30m bid is  covered  Covered?: Yes  Tier: 3 Drug  Prescription Coverage Preferred Pharmacy: [facility]  Spoke with Person/Company/Phone Number:: NOvid Curd Co-Pay: 3.90 for 30 day supply  Prior Approval: No  Deductible: Met    Patient has a LPump BackPhone Number: 06/14/2019, 12:26 PM

## 2019-06-14 NOTE — Progress Notes (Signed)
Subjective:  Patient evaluated at bedside this AM. States she was able to get herself up to the bedside commode with minimal assistance. Patient reports continued left hip pain. Reports occasional right hip pain that hurts but does not get as bad as the left hip pain. Patient asking for help with pain. Patient updated on results of catheterization. Patient articulates understanding. All questions answered. She states she took cholesterol medication (atorvastatin) before but caused her to have muscle pains and stomach pain. Discussed trying a different cholesterol medication at low dose. She asked if she could come off statin if cholesterol lowers. Informed this can be discussed further as outpatient. Patient hopeful to go home today.   Review of Systems - Negative for SOB, chest pain, abdominal pain, and nausea.  Objective:  Vital signs in last 24 hours: Vitals:   06/13/19 2057 06/14/19 0005 06/14/19 0343 06/14/19 0721  BP: 115/64  135/62 137/77  Pulse: 82 78 83 80  Resp: 18 19 (!) 21 20  Temp: 99 F (37.2 C) 99 F (37.2 C) 98.4 F (36.9 C) 99.1 F (37.3 C)  TempSrc: Oral Oral Oral Axillary  SpO2: 98%  100% 99%  Weight:   92 kg   Height:       Weight change: -0.9 kg  Intake/Output Summary (Last 24 hours) at 06/14/2019 1030 Last data filed at 06/14/2019 0344 Gross per 24 hour  Intake 1999.81 ml  Output 2450 ml  Net -450.19 ml   Physical Exam  Constitutional: She appears well-developed and well-nourished.  HENT:  Head: Normocephalic and atraumatic.  Pulmonary/Chest: Effort normal.  Musculoskeletal:        General: Tenderness present. Examination of the hip showed no exacerbation of pain upon internal rotation of the left leg. Palpation of the left lateral hip elicited discomfort.  Neurological: She is alert.  Skin: Skin is warm and dry.  Psychiatric: She has a normal mood and affect.    Assessment/Plan:  Principal Problem:   NSTEMI (non-ST elevated myocardial  infarction) ([place]) Active Problems:   DM type 2, uncontrolled, with retinopathy ([place])   Chronic diastolic heart failure ([facility])   Transaminitis   Fever, unspecified  Assessment & Plan:  Ms. Oganesyan is a 68 year old female who presented at the ED with fatigue, SOB, and hip pain and found to have an NSTEMI and transaminitis. Subsequent left heart cath showed severe triple vessel CAD that was managed with a stent in the proximal LAD and balloon angioplasty of the obtuse marginal branch. The circumflex was also has moderately severe disease but no stent was placed. Patient has been a candidate for CABG in the past but has refused due to her refusal of blood products as a Jehovah's Witness. Her hip pain has not yet resolved and physical exam findings are concerning for greater trochanteric pain syndrome. Patient is scheduled for an MRI of the Lumbar Spine Wo Contrast on 06/20/2019 with [facility]. Her transaminitis has been down-trending since her catheterization. 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  Plan - Cardiac Ischemia:  -Continue DAPT with ASA and Brilinta for one year.  -Continue TOPROL-XL 50 mg tablet once daily.  -Since AST/ALT dropped below 3x baseline, start rosuvastatin 5 mg daily. Patient reported muscle pains and stomach issues with statins in past, however, agreed to try again with a smaller dose.  Plan - Hip Pain: -May be a candidate for  bursa injection during outpatient management.    Plan - Transaminitis:  -Viral hepatitis serology panel is pending. Follow-up on results in clinic.     LOS: 4 days   Ozella Almond, Medical Student 06/14/2019, 10:30 AM

## 2019-06-14 NOTE — Progress Notes (Signed)
Discharged home by wheelchair accompanied by daughter. Discharge instructions and equipment with the pt. Belongings taken home.

## 2019-06-14 NOTE — Progress Notes (Signed)
CARDIAC REHAB PHASE I   PRE:  Rate/Rhythm: 77 SR    BP: sitting 140/63    SaO2: 98 RA  MODE:  Ambulation: 100 ft   POST:  Rate/Rhythm: 104 ST    BP: sitting 131/70     SaO2: 100 RA  Pt c/o left leg pain that has been present for weeks. She sts that it has an infection that exacerbated her MI. She was able to get up slowly on her own and walk with RW. We used assist x2 for safety but she was fairly independent. She is weak and rested x2. She needed reminders for safety with RW. She does not have a RW at home and will need one at d/c. VSS after walk. Discussed MI, stent, Brilinta, restrictions, diet, exercise, NTG, and CRPII. She was receptive and interested in CRPII (did previously). She has some questions regarding Brilinta and her meloxicam for MD. Her daughter will be with her at home. [id number]  [employer], ACSM 06/14/2019 9:54 AM

## 2019-06-14 NOTE — Evaluation (Signed)
Physical Therapy Evaluation Patient Details Name: [name] MRN: [id number] DOB: 1951/03/16 Today's Date: 06/14/2019   History of Present Illness  Pt is a 68 y.o. F with significant PMH of three-vessel coronary disease, medically managed, HFpEF, diabetes, hypertension, breast cancer s/p chemo and radiation seen for evaluation of chest pain and elevated troponin. Echo with mildly reduced LVEF, 45-50%. LHC 9/9 showed severe stenosis in proximal LAD treated with PTCA/DES, severe stenosis in first obtuse marginal branch treated with balloon angioplasty.  Clinical Impression  Pt admitted with above. Pt presents with decreased functional mobility secondary to decreased cardiovascular endurance, weakness, balance impairments. Ambulating 25 feet x 2 with Rollator at a min guard assist level. HR 70-77 bpm, BP 138/47 post mobility. Prior to admission, pt lives alone in a level entry apartment. She states her daughter will be able to assist as needed. Education provided re: activity recommendations, energy conservation, endurance strategies. Will follow acutely.     Follow Up Recommendations Supervision for mobility/OOB;Outpatient PT (CRPII)    Equipment Recommendations  3in1 (PT);Other (comment)(Rollator, tub bench)    Recommendations for Other Services       Precautions / Restrictions Precautions Precautions: Fall Restrictions Weight Bearing Restrictions: No      Mobility  Bed Mobility Overal bed mobility: Needs Assistance Bed Mobility: Supine to Sit;Sit to Supine     Supine to sit: Supervision Sit to supine: Min assist   General bed mobility comments: Progressing to sitting without assist, use of bed rail. MinA for LE negotiation back into bed  Transfers Overall transfer level: Needs assistance   Transfers: Sit to/from Stand Sit to Stand: Supervision            Ambulation/Gait Ambulation/Gait assistance: Min guard Gait Distance (Feet): 25 Feet(x2) Assistive device:  4-wheeled walker Gait Pattern/deviations: Step-through pattern;Decreased stride length;Antalgic Gait velocity: decreased Gait velocity interpretation: <1.8 ft/sec, indicate of risk for recurrent falls General Gait Details: Slow pace, fatigues easily  Stairs            Wheelchair Mobility    Modified Rankin (Stroke Patients Only)       Balance Overall balance assessment: Needs assistance Sitting-balance support: Feet supported Sitting balance-Leahy Scale: Good     Standing balance support: Bilateral upper extremity supported Standing balance-Leahy Scale: Poor Standing balance comment: reliant on external support                             Pertinent Vitals/Pain Pain Assessment: No/denies pain    Home Living Family/patient expects to be discharged to:: Private residence Living Arrangements: Alone Available Help at Discharge: Family;Available PRN/intermittently Type of Home: Apartment Home Access: Level entry     Home Layout: One level Home Equipment: Cane - single point      Prior Function Level of Independence: Independent               Hand Dominance        Extremity/Trunk Assessment   Upper Extremity Assessment Upper Extremity Assessment: Generalized weakness    Lower Extremity Assessment Lower Extremity Assessment: Generalized weakness    Cervical / Trunk Assessment Cervical / Trunk Assessment: Kyphotic  Communication   Communication: No difficulties  Cognition Arousal/Alertness: Awake/alert Behavior During Therapy: WFL for tasks assessed/performed Overall Cognitive Status: Within Functional Limits for tasks assessed  General Comments      Exercises     Assessment/Plan    PT Assessment Patient needs continued PT services  PT Problem List Decreased strength;Decreased activity tolerance;Decreased mobility;Decreased balance;Cardiopulmonary status limiting  activity       PT Treatment Interventions DME instruction;Gait training;Functional mobility training;Therapeutic activities;Therapeutic exercise;Balance training;Patient/family education    PT Goals (Current goals can be found in the Care Plan section)  Acute Rehab PT Goals Patient Stated Goal: "walk long distances again." PT Goal Formulation: With patient Time For Goal Achievement: 06/28/19 Potential to Achieve Goals: Good    Frequency Min 3X/week   Barriers to discharge        Co-evaluation               AM-PAC PT "6 Clicks" Mobility  Outcome Measure Help needed turning from your back to your side while in a flat bed without using bedrails?: None Help needed moving from lying on your back to sitting on the side of a flat bed without using bedrails?: None Help needed moving to and from a bed to a chair (including a wheelchair)?: None Help needed standing up from a chair using your arms (e.g., wheelchair or bedside chair)?: None Help needed to walk in hospital room?: A Little Help needed climbing 3-5 steps with a railing? : A Lot 6 Click Score: 21    End of Session   Activity Tolerance: Patient tolerated treatment well Patient left: in bed;with call bell/phone within reach;with family/visitor present   PT Visit Diagnosis: Difficulty in walking, not elsewhere classified (R26.2);Unsteadiness on feet (R26.81);Muscle weakness (generalized) (M62.81)    Time: [id number] PT Time Calculation (min) (ACUTE ONLY): 26 min   Charges:   PT Evaluation $PT Eval Moderate Complexity: 1 Mod PT Treatments $Therapeutic Activity: 8-22 mins        Ellamae Sia, PT, DPT Acute Rehabilitation Services Pager [phone] Office [phone]   Willy Eddy 06/14/2019, 3:51 PM

## 2019-06-14 NOTE — Progress Notes (Addendum)
Progress Note  Patient Name: [name] Date of Encounter: 06/14/2019  Primary Cardiologist: Minus Breeding, MD   Subjective   Doing well this morning. No complaints. Denies CP. No dyspnea. Right radial cath site stable. No hand/ arm pain.   Inpatient Medications    Scheduled Meds: . aspirin EC  81 mg Oral Daily  . Chlorhexidine Gluconate Cloth  6 each Topical Q0600  . furosemide  40 mg Intravenous BID  . insulin aspart  0-15 Units Subcutaneous TID WC  . mouth rinse  15 mL Mouth Rinse BID  . metoprolol succinate  50 mg Oral Daily  . mupirocin ointment  1 application Nasal BID  . sodium chloride flush  3 mL Intravenous Q12H  . ticagrelor  90 mg Oral BID   Continuous Infusions: . sodium chloride Stopped (06/13/19 1121)  . sodium chloride     PRN Meds: sodium chloride, sodium chloride, diphenhydrAMINE, nitroGLYCERIN, ondansetron (ZOFRAN) IV, sodium chloride flush   Vital Signs    Vitals:   06/13/19 1602 06/13/19 2057 06/14/19 0005 06/14/19 0343  BP: (!) 153/77 115/64  135/62  Pulse: 81 82 78 83  Resp: 18 18 19  (!) 21  Temp: 98.6 F (37 C) 99 F (37.2 C) 99 F (37.2 C) 98.4 F (36.9 C)  TempSrc: Oral Oral Oral Oral  SpO2: 100% 98%  100%  Weight:    92 kg  Height:        Intake/Output Summary (Last 24 hours) at 06/14/2019 0710 Last data filed at 06/14/2019 0344 Gross per 24 hour  Intake 1999.81 ml  Output 2450 ml  Net -450.19 ml   Last 3 Weights 06/14/2019 06/13/2019 06/12/2019  Weight (lbs) 202 lb 13.2 oz 204 lb 12.9 oz 204 lb 12.9 oz  Weight (kg) 92 kg 92.9 kg 92.9 kg      Telemetry    NSR mid 70s - Personally Reviewed  ECG    NSR w/ 1st degree AVB, 74 bpm, lateral ST segment depressions - Personally Reviewed  Physical Exam   GEN: obese middle aged AAF in no acute distress.   Neck: No JVD Cardiac: RRR, no murmurs, rubs, or gallops.  Respiratory: Clear to auscultation bilaterally. GI: Soft, nontender, non-distended  MS: No edema; No deformity.  Neuro:  Nonfocal  Psych: Normal affect   Labs    High Sensitivity Troponin:   Recent Labs  Lab 06/10/19 1515 06/10/19 1900 06/11/19 0629 06/11/19 1209  TROPONINIHS 86* 802* 12,648* 20,461*      Chemistry Recent Labs  Lab 06/11/19 0036 06/11/19 0629 06/12/19 0318 06/13/19 0233 06/14/19 0231  NA 138 137 138 138 136  K 4.9 4.5 4.3 4.3 4.7  CL 106 105 108 110 107  CO2 20* 20* 21* 21* 20*  GLUCOSE 190* 233* 179* 167* 116*  BUN 35* 38* 33* 18 16  CREATININE 1.19* 1.30* 1.18* 0.93 0.78  CALCIUM 9.2 8.9 9.1 8.8* 8.7*  PROT 7.0 6.7 6.6  --   --   ALBUMIN 3.6 3.2* 3.1*  --   --   AST 329* 343* 403*  --   --   ALT 271* 258* 247*  --   --   ALKPHOS 108 104 101  --   --   BILITOT 2.4* 3.1* 2.5*  --   --   GFRNONAA 47* 42* 47* >60 >60  GFRAA 54* 49* 55* >60 >60  ANIONGAP 12 12 9 7 9      Hematology Recent Labs  Lab 06/12/19 0318 06/13/19  [id number] 06/14/19 0231  WBC 7.6 6.8 5.7  RBC 3.57* 3.36* 3.44*  HGB 10.7* 10.2* 10.5*  HCT 33.5* 31.4* 31.0*  MCV 93.8 93.5 90.1  MCH 30.0 30.4 30.5  MCHC 31.9 32.5 33.9  RDW 15.3 15.1 14.9  PLT 156 154 179    BNP Recent Labs  Lab 06/10/19 1515 06/10/19 2036  BNP 86.1 250.1*     DDimer No results for input(s): DDIMER in the last 168 hours.   Radiology    No results found.  Cardiac Studies    2D Echo 06/11/19 1. The left ventricle has mildly reduced systolic function, with an ejection fraction of 45-50%. The cavity size was normal. Left ventricular diastolic Doppler parameters are consistent with pseudonormalization. Elevated left ventricular end-diastolic pressure Left ventricular diffuse hypokinesis. 2. The right ventricle has normal systolic function. The cavity was normal. There is no increase in right ventricular wall thickness. 3. There is mild to moderate mitral annular calcification present. 4. The aortic valve is tricuspid. Mild sclerosis of the aortic valve. Aortic valve regurgitation is trivial by color flow  Doppler. 5. The aorta is normal unless otherwise noted.  LHC 06/13/19  Conclusion    Prox LAD lesion is 99% stenosed.  Ost 1st Mrg lesion is 99% stenosed.  Prox Cx lesion is 80% stenosed.  Mid Cx lesion is 90% stenosed.  Dist Cx lesion is 95% stenosed.  Prox RCA lesion is 85% stenosed.  Prox RCA to Mid RCA lesion is 70% stenosed.  Ost Ramus to Ramus lesion is 75% stenosed.  Ost LM to Mid LM lesion is 40% stenosed.  A drug-eluting stent was successfully placed using a STENT SYNERGY DES 3X12.  Post intervention, there is a 0% residual stenosis.  Balloon angioplasty was performed using a BALLOON SAPPHIRE [place] 2.0X15.  Post intervention, there is a 70% residual stenosis.   1. Severe triple vessel CAD, previously turned down for CABG in 2018 due to refusal of blood products 2. Severe stenosis proximal LAD. Successful PTCA/DES x 1 proximal LAD 3. Severe stenosis in the first obtuse marginal branch with moderately severe disease in the Circumflex. Successful PTCA with balloon angioplasty only of the obtuse marginal branch.  4. Moderate disease in the small non-dominant RCA 5. Elevated filling pressures  Recommendations: Continue DAPT with ASA and Brilinta for one year. Continue beta blocker. She is statin intolerant.     Coronary Diagrams  Diagnostic Dominance: Right  Intervention     Patient Profile     68 y.o.femalewith a history of three-vessel coronary disease, medically managed, HFpEF, diabetes, hypertension, hyperlipidemia, breast cancer status post chemo and radiation, obesity, who is being seen for the evaluation ofchest pain and elevated troponinat the request of Dr. Billy Fischer.She has a history of 3 vessel CAD and did not want CABG.  Assessment & Plan    1. CAD/ NSTEMI: known severe 3V disease, previously turned down for CABG in 2018 due to refusal of blood products. Now w/ significant hs troponin elevation c/w NSTEMI. 88>>802>>12K>>20K. Echo with  mildly reduced LVEF, 45-50%  LHC 9/9 showed severe stenosis in proximal LAD treated w/ PTCA/DES x 1 proximal LAD, severe stenosis in the first obtuse marginal branch with moderately severe disease in the circumflex, treated w/ balloon angioplasty only of the obtuse marginal branch and moderate disease in the small non-dominant RCA (medical therapy)  CP free. No dyspnea. Rt Radial cath site stable. 2+ radial pulse. Renal function stable post cath.   Treat w/ DAPT w/ ASA + Brilinta  for a minimum of 1 yr   Continue  blocker therapy, Metoprolol 50 mg daily  No statin currently given elevated LFTs. Also h/o statin intolerance. Consider PCSK9i for LDL target < 70.   Continue Imdur given residual CAD  Ambulate w/ cardiac rehab today. Recommend phase II outpatient cardiac rehab.   Will need to use caution with use of Meloxicam, given DAPT (on home med list). Continue PPI for GI protection.   2. HTN: BP controlled this morning.  Continue metoprolol 50 mg daily  Given LV dysfunction and NSTEMI, recommend resuming ARB. Was on losartan pre admit. Renal function stable post cath. SBP in the 130s. Will restart home losartan dose, 50 mg daily today.   Will hold off on adding back home spiro today. If BP stable, consider resuming tomorrow.  3. DLD: LDL elevated at 141. Goal given multivessel CAD and DM < 70 mg.  No statins currently given abnormal LFTs. Also h/o statin intolerance.  Will refer to [facility] post discharge for PCSK9i therapy   4. Fever: Etiology uncertain. Tmax 102 on admit. COVID negative. Blood cultures no growth to date. Afebrile today.  WBC WNL. On IV antibiotics Vanc + cefepime.  Further management and w/u per primary team   5. Elevated Liver Enzymes: do not suspect that this is related to her MI as it continues to rise. Doubt passive hepatic congestion as she does not otherwise have overt right sided heart failure.    Will defer further imaging work up to primary  team.   6. DM: poorly controlled. Hgb A1c 8.1.   Continue to hold Metformin post cath to reduce risk of CIN. Can resume 9/12.   Further management per PCP   Consider later addition of SGLT2i  7. Systolic HF/ ICM: EF mildly reduced at 45-50%.  Continue  blocker + ARB  Plan to later resume spironolactone if BP able to tolerate  Given concomitant DM, consider later addition of SGLT2i. Will defer to primary cardiologist/ PCP  Will need low salt diet and daily weight instructions on d/c  MD to follow with further recommendations.     For questions or updates, please contact [place] Please consult [email or web address] for contact info under        Signed, Lyda Jester, PA-C  06/14/2019, 7:10 AM    History and all data above reviewed.  Patient examined.  I agree with the findings as above.   The patient denies any chest pain currently.  She is doing well post PCI.  She has a little bit of shortness of breath.  The patient exam reveals COR:RRR  ,  Lungs: Clear  ,  Abd: Positive bowel sounds, no rebound no guarding, Ext Right radial without bruising or bleeding  .  All available labs, radiology testing, previous records reviewed. Agree with documented assessment and plan. CAD/UNSTABLE ANGINA:  Post PCI.  Meds as written above.  She is going home today and we are arranging follow up.    Jeneen Rinks Zanobia Griebel  4:59 PM  06/14/2019

## 2019-06-14 NOTE — Plan of Care (Signed)

## 2019-06-15 LAB — CULTURE, BLOOD (ROUTINE X 2)
Culture: NO GROWTH
Culture: NO GROWTH
Special Requests: ADEQUATE

## 2019-06-15 NOTE — Discharge Summary (Addendum)
Name: [name] MRN: [id number] DOB: 1951-06-30 68 y.o. PCP: Bartholomew Crews, MD  Date of Admission: 06/10/2019  1:48 PM Date of Discharge: 06/14/2019 Attending Physician: Lalla Brothers, MD  Discharge Diagnosis: 1. NSTEMI 2. Transaminitis 3. L Hip Pain   Discharge Medications: Allergies as of 06/14/2019       Reactions   Blood-group Specific Substance Other (See Comments)   NO BLOOD - JEHOVAH'S WITNESS   Lisinopril Other (See Comments)   Chronic cough secondary to ACE   Oxycodone-acetaminophen Hives, Itching, Other (See Comments)   flushing   Percocet [oxycodone-acetaminophen] Hives   Victoza [liraglutide] Other (See Comments)   Made her feel "out of it"   Atorvastatin Other (See Comments)   myalgias        Medication List     STOP taking these medications    meloxicam 7.5 MG tablet Commonly known as: MOBIC       TAKE these medications    Accu-Chek FastClix Lancets Misc Check blood sugar 3 times a day What changed:  how much to take how to take this when to take this additional instructions   Accu-Chek Guide test strip Generic drug: glucose blood USE 1 STRIP THREE TIMES DAILY What changed: See the new instructions.   amLODipine 10 MG tablet Commonly known as: NORVASC TAKE ONE (1) TABLET BY MOUTH EVERY DAY What changed: See the new instructions.   anastrozole 1 MG tablet Commonly known as: ARIMIDEX Take 1 tablet (1 mg total) by mouth daily.   aspirin EC 81 MG tablet Take 81 mg by mouth daily.   Co Q 10 100 MG Caps Take 100 mg by mouth daily.   diclofenac 1.3 % Ptch Commonly known as: FLECTOR Place 1 patch onto the skin 2 (two) times daily.   furosemide 20 MG tablet Commonly known as: LASIX TAKE 1 TABLET (20 MG TOTAL) BY MOUTH AS NEEDED FOR EDEMA. What changed: when to take this   gabapentin 300 MG capsule Commonly known as: NEURONTIN Take 1 capsule (300 mg total) by mouth at bedtime.   insulin aspart 100 UNIT/ML FlexPen  Commonly known as: NovoLOG FlexPen Inject 6 Units into the skin 3 (three) times daily with meals.   Insulin Pen Needle 32G X 4 MM Misc USE FOR INJECTIONS up to 4 times daily. Diagnosis code E11.40, Z79.4   isosorbide mononitrate 120 MG 24 hr tablet Commonly known as: IMDUR Take 1 tablet (120 mg total) by mouth 2 (two) times daily. OFFICE VISIT NEEDED What changed: additional instructions   losartan 50 MG tablet Commonly known as: COZAAR TAKE ONE (1) TABLET BY MOUTH EVERY DAY What changed: See the new instructions.   metFORMIN 1000 MG tablet Commonly known as: GLUCOPHAGE Take 1 tablet (1,000 mg total) by mouth 2 (two) times daily with a meal.   metoprolol succinate 100 MG 24 hr tablet Commonly known as: TOPROL-XL TAKE ONE (1) TABLET BY MOUTH EVERY DAY What changed: See the new instructions.   nitroGLYCERIN 0.4 MG SL tablet Commonly known as: NITROSTAT PLACE ONE TABLET UNDER THE TONGUE EVERY 5 MINUTES AS NEEDED FOR CHEST PAIN What changed: See the new instructions.   omeprazole 40 MG capsule Commonly known as: PRILOSEC Take 1 capsule (40 mg total) by mouth daily. What changed:  when to take this reasons to take this   Pataday 0.2 % Soln Generic drug: Olopatadine HCl Place 1 drop into both eyes daily as needed (for allergies).   pyridoxine 100 MG tablet Commonly known  as: B-6 Take 100 mg by mouth daily.   rosuvastatin 5 MG tablet Commonly known as: CRESTOR Take 1 tablet (5 mg total) by mouth daily.   spironolactone 25 MG tablet Commonly known as: ALDACTONE Take 2 tablets (50 mg total) by mouth daily.   ticagrelor 90 MG Tabs tablet Commonly known as: BRILINTA Take 1 tablet (90 mg total) by mouth 2 (two) times daily.   Toujeo SoloStar 300 UNIT/ML Sopn Generic drug: Insulin Glargine (1 Unit Dial) INJECT 35 UNITS INTO THE SKIN DAILY What changed: See the new instructions.   vitamin B-12 1000 MCG tablet Commonly known as: CYANOCOBALAMIN Take 1,000 mcg by mouth  daily.        Disposition and follow-up:   [name] was discharged from [facility] in fair condition.  At the hospital follow up visit please address:  1. NSTEMI: Patient initially presented with chest pain and troponins were elevated to 20,461.  Patient went to the Cath Lab and was found to have severe triple-vessel disease with up to 99% stenosis in multiple vessels. One stent placed. Discharged with the following additional medications.  Brilinta 90 mg twice daily  Aspirin 81 mg daily  Rosuvastatin 5 mg daily   Cardiac rehabilitation referral was placed  2. Transaminitis: Patient initially presented with transaminitis to 403:247. RUQ ultrasound was performed and showed diffuse hepatic steatosis, but there were no evidence for thrombosis or cholestasis. Suspect NAFLD. AST and ALT down trended to 88:122 respectively on the day of discharge.  It is unclear at this time what caused the transaminitis. Suspect potential mild ischemic injury in the setting of NSTEMI. Hepatitis panel pending.  Consider GI referral  3. L Hip pain: Pointed tenderness over the left greater trochanter, consistent with bursitis.  Follow-up for possible steroid injection  4.  Labs / imaging needed at time of follow-up: CBC, BMP  5.  Pending labs/ test needing follow-up: Hepatitis panel  Follow-up Appointments:    Hospital Course by problem list: 1. NSTEMI 2. Transaminitis 3. L Hip pain   [name] is a 68 year old Jehovah witness patient with a history of extensive three-vessel coronary disease,HFpEF, T2DM, HTN,HLD, and breast cancer status post bilateral mastectomy and chemo with radiation (last radiation tx Feburary 2020) who presented to the hospital with fever to 102, chest pain, and transaminitis (AST:ALT 403:247).  Work-up was significant for NSTEMI for which the patient received a catheterization and stent placed. Catheterization was notable for severe triple-vessel disease  with up to 99% stenosis in multiple vessels. She was discharged on the following additional medications:  Brilinta 90 mg twice daily  Aspirin 81 mg daily  Rosuvastatin 5 mg daily   Cardiac rehabilitation referral was placed per cardiology's recommendations.  The etiology of the fever is unclear at this time.  Patient did not show signs of infection after presenting with a fever of 102 which resolved spontaneously after being admitted.  As for the transaminitis, the patient consistently downtrending during her stay.  Right upper quadrant ultrasound of was performed which was significant for diffuse hepatic steatosis but did not show evidence of thrombosis or cholestasis.  It was unclear what caused the transaminitis, however we suspect this may have been due to underlying fatty liver under stress in the context of an STEMI.  Hepatitis panel was ordered and still pending at the time of discharge.  Patient may benefit from a GI referral.  AST and ALT were 88:122 at the time of discharge.  The patient  also complained of left-sided hip pain with point tenderness at the left greater trochanter, consistent with bursitis.  Encouraged her to follow this up with her PCP for a potential steroid injection.  Discharge Vitals:   BP (!) 172/94 (BP Location: Left Leg)   Pulse 75   Temp 98.8 F (37.1 C) (Oral)   Resp 15   Ht 5\' 1"  (1.549 m)   Wt 92 kg   LMP 07/12/1984   SpO2 93%   BMI 38.32 kg/m   Pertinent Labs, Studies, and Procedures:  CBC Latest Ref Rng & Units 06/14/2019 06/13/2019 06/12/2019  WBC 4.0 - 10.5 K/uL 5.7 6.8 7.6  Hemoglobin 12.0 - 15.0 g/dL 10.5(L) 10.2(L) 10.7(L)  Hematocrit 36.0 - 46.0 % 31.0(L) 31.4(L) 33.5(L)  Platelets 150 - 400 K/uL 179 154 156   BMP Latest Ref Rng & Units 06/14/2019 06/13/2019 06/12/2019  Glucose 70 - 99 mg/dL 116(H) 167(H) 179(H)  BUN 8 - 23 mg/dL 16 18 33(H)  Creatinine 0.44 - 1.00 mg/dL 0.78 0.93 1.18(H)  BUN/Creat Ratio 12 - 28 - - -  Sodium 135 - 145 mmol/L  136 138 138  Potassium 3.5 - 5.1 mmol/L 4.7 4.3 4.3  Chloride 98 - 111 mmol/L 107 110 108  CO2 22 - 32 mmol/L 20(L) 21(L) 21(L)  Calcium 8.9 - 10.3 mg/dL 8.7(L) 8.8(L) 9.1   Discharge Instructions: Discharge Instructions     (HEART FAILURE PATIENTS) Call MD:  Anytime you have any of the following symptoms: 1) 3 pound weight gain in 24 hours or 5 pounds in 1 week 2) shortness of breath, with or without a dry hacking cough 3) swelling in the hands, feet or stomach 4) if you have to sleep on extra pillows at night in order to breathe.   Complete by: As directed    (HEART FAILURE PATIENTS) Call MD:  Anytime you have any of the following symptoms: 1) 3 pound weight gain in 24 hours or 5 pounds in 1 week 2) shortness of breath, with or without a dry hacking cough 3) swelling in the hands, feet or stomach 4) if you have to sleep on extra pillows at night in order to breathe.   Complete by: As directed    (HEART FAILURE PATIENTS) Call MD:  Anytime you have any of the following symptoms: 1) 3 pound weight gain in 24 hours or 5 pounds in 1 week 2) shortness of breath, with or without a dry hacking cough 3) swelling in the hands, feet or stomach 4) if you have to sleep on extra pillows at night in order to breathe.   Complete by: As directed    AMB Referral to Phase II Cardiac Rehab   Complete by: As directed    Diagnosis:  Coronary Stents Heart Failure (see criteria below if ordering Phase II) NSTEMI     Heart Failure Type: Chronic Diastolic   After initial evaluation and assessments completed: Virtual Based Care may be provided alone or in conjunction with Phase 2 Cardiac Rehab based on patient barriers.: Yes   AMB Referral to Phase II Cardiac Rehab   Complete by: As directed    Diagnosis: NSTEMI   After initial evaluation and assessments completed: Virtual Based Care may be provided alone or in conjunction with Phase 2 Cardiac Rehab based on patient barriers.: Yes   Amb Referral to Cardiac  Rehabilitation   Complete by: As directed    Diagnosis:  NSTEMI Coronary Stents PTCA     After initial evaluation and  assessments completed: Virtual Based Care may be provided alone or in conjunction with Phase 2 Cardiac Rehab based on patient barriers.: Yes   Call MD for:   Complete by: As directed    Call MD for:   Complete by: As directed    Call MD for:   Complete by: As directed    Call MD for:  difficulty breathing, headache or visual disturbances   Complete by: As directed    Call MD for:  difficulty breathing, headache or visual disturbances   Complete by: As directed    Call MD for:  difficulty breathing, headache or visual disturbances   Complete by: As directed    Call MD for:  extreme fatigue   Complete by: As directed    Call MD for:  extreme fatigue   Complete by: As directed    Call MD for:  extreme fatigue   Complete by: As directed    Call MD for:  hives   Complete by: As directed    Call MD for:  hives   Complete by: As directed    Call MD for:  hives   Complete by: As directed    Call MD for:  persistant dizziness or light-headedness   Complete by: As directed    Call MD for:  persistant dizziness or light-headedness   Complete by: As directed    Call MD for:  persistant dizziness or light-headedness   Complete by: As directed    Call MD for:  persistant nausea and vomiting   Complete by: As directed    Call MD for:  persistant nausea and vomiting   Complete by: As directed    Call MD for:  persistant nausea and vomiting   Complete by: As directed    Call MD for:  redness, tenderness, or signs of infection (pain, swelling, redness, odor or green/yellow discharge around incision site)   Complete by: As directed    Call MD for:  redness, tenderness, or signs of infection (pain, swelling, redness, odor or green/yellow discharge around incision site)   Complete by: As directed    Call MD for:  redness, tenderness, or signs of infection (pain, swelling,  redness, odor or green/yellow discharge around incision site)   Complete by: As directed    Call MD for:  severe uncontrolled pain   Complete by: As directed    Call MD for:  severe uncontrolled pain   Complete by: As directed    Call MD for:  severe uncontrolled pain   Complete by: As directed    Call MD for:  temperature >100.4   Complete by: As directed    Call MD for:  temperature >100.4   Complete by: As directed    Call MD for:  temperature >100.4   Complete by: As directed    Diet - low sodium heart healthy   Complete by: As directed    Diet - low sodium heart healthy   Complete by: As directed    Diet - low sodium heart healthy   Complete by: As directed    Discharge instructions   Complete by: As directed    Thank you for allowing [place] to take care of you during your hospitalization.  Below is a summary of what we treated.  1.  Heart failure  Continue metoprolol 100 mg daily  Amlodipine 10 mg daily  Imdur 120 mg twice daily  Losartan 50 mg daily  Spironolactone 50 mg daily  Lasix 20  mg daily as needed for edema  2.  Heart attack: You received a heart catheterization and had 1 stent placed in your heart after your heart attack.  Please take the following medications when your discharge:  Aspirin 81 mg daily  Brilinta 10 mg daily  Rosuvastatin 5 mg daily  Nitroglycerin 1 tablet every 5 minutes as needed for chest pain  3.  Elevated liver enzymes: We think your liver enzyme numbers were elevated in relation to the heart attack.  Sometimes when the heart is not pumping blood efficiently, some organs do not get enough blood.  We think this may have happened with your liver.  Follow up with your primary care provider  4.  Hip pain: Your hip pain is most likely the result of an inflamed bursa.  The condition is called greater trochanteric bursitis.  Follow-up with your primary care provider at next clinic visit.  They may consider doing a local steroid injection.  If you  have any questions or concerns, please feel free to reach out to [place].   Discharge instructions   Complete by: As directed    Increase activity slowly   Complete by: As directed    Increase activity slowly   Complete by: As directed    Increase activity slowly   Complete by: As directed        Signed: Earlene Plater, MD Internal Medicine, PGY1 Pager: [phone]  06/15/2019,3:55 PM

## 2019-06-16 LAB — HCV INTERPRETATION

## 2019-06-16 LAB — HEPATITIS B SURFACE ANTIBODY, QUANTITATIVE: Hep B S AB Quant (Post): 5.6 m[IU]/mL — ABNORMAL LOW (ref >9.9)

## 2019-06-16 LAB — HCV AB W REFLEX TO QUANT PCR: HCV Ab: 0.2 s/co ratio (ref 0.0–0.9)

## 2019-06-20 ENCOUNTER — Ambulatory Visit (HOSPITAL_COMMUNITY): Payer: Medicare Other

## 2019-06-21 ENCOUNTER — Encounter: Payer: Self-pay | Admitting: Internal Medicine

## 2019-06-21 ENCOUNTER — Other Ambulatory Visit: Payer: Self-pay

## 2019-06-21 ENCOUNTER — Ambulatory Visit (INDEPENDENT_AMBULATORY_CARE_PROVIDER_SITE_OTHER): Payer: Medicare Other | Admitting: Internal Medicine

## 2019-06-21 VITALS — BP 127/68 | HR 88 | Temp 98.8°F | Ht 61.0 in | Wt 206.6 lb

## 2019-06-21 DIAGNOSIS — R7401 Elevation of levels of liver transaminase levels: Secondary | ICD-10-CM

## 2019-06-21 DIAGNOSIS — I214 Non-ST elevation (NSTEMI) myocardial infarction: Secondary | ICD-10-CM

## 2019-06-21 DIAGNOSIS — I25118 Atherosclerotic heart disease of native coronary artery with other forms of angina pectoris: Secondary | ICD-10-CM

## 2019-06-21 DIAGNOSIS — R269 Unspecified abnormalities of gait and mobility: Secondary | ICD-10-CM

## 2019-06-21 DIAGNOSIS — R740 Nonspecific elevation of levels of transaminase and lactic acid dehydrogenase [LDH]: Secondary | ICD-10-CM

## 2019-06-21 NOTE — Progress Notes (Signed)
CC: Hospital follow up of NSTEMI  HPI:  [name] is a 68 y.o. female Jehovah witness with CAD, HFpEF, essential htn, OSA, DM2, breast cancer s/p bilateral msastectomy chemo and radiation, iron deficiency anemia who presents for hospital follow up of NSTEMI. Please see problem based charting for evaluation, assessment, and plan.  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 S/p lumpectomy and radiation.   CAD (coronary artery disease) 11/07   cath 11/11 :  3V CADsee report    Cataract    small both eyes   Chronic lower back pain    Chronic pain    MR 08/07/10 :  Spondylosis L4-5 with B foraminal narrowing and L4 nerve root enchroachment B.   Chronic venous insufficiency 2012   Has had full W/U incl ECHO, LFT's, Cr, Umicro, TSH, BNP. Symptomatic treatment.   Colonic polyp 1995   Colonoscopies 1994, 2000, and 02/02/2011. Last had 9 polyps - Tubular adenoma and tubulovillous was the pathology results without high grade dysplasia   Diabetes mellitus type II    Insulin dependent. Has worked with [name] R previously.   Diastolic CHF, chronic (HCC)    NL EF by echo 01/2012, Gr I dd   Essential hypertension    Requires 5 drug therapy and still difficult to control.   GERD (gastroesophageal reflux disease)    Heart murmur    History of radiation therapy 10/02/18- 11/09/18   Left chest wall SCV, Left chest wall boost, Right chest wall SCV, right chest boost all received 28 fractions for a total dose of 50.04 Gy   Hyperlipidemia    On daily statin   Iron deficiency anemia    Ferritin was 12 in 2012. on FeSo4. Has had colon and EGD 02/02/11 that showed mild gastritis and colon polyps.   Obesity    Morbid. HAs worked with [name] T previously.   OSA (obstructive sleep apnea) 02/16/2007   02/2007 : Moderate AHI 35.6, O2 sat decreased to  65%, CPAP titration to 16 with AHI of 3.6. 02/2014 : Rare respiratory events with sleep disturbance, within normal limits. AHI 0.4 per hour       OSA on CPAP    Personal history of chemotherapy    Personal history of radiation therapy    "to my left breast"   Refusal of blood transfusions as patient is Jehovah's Witness    Seasonal allergies    "grass pollen"   Review of Systems:    Review of Systems  Constitutional: Negative for chills and fever.  Respiratory: Positive for shortness of breath. Negative for cough.   Cardiovascular: Negative for chest pain.  Gastrointestinal: Negative for abdominal pain, nausea and vomiting.  Musculoskeletal: Positive for joint pain (bilateral hips).  Neurological: Positive for dizziness (when walking or cleaning). Negative for headaches.   Physical Exam:  Vitals:   06/21/19 1027  BP: 127/68  Pulse: 88  Temp: 98.8 F (37.1 C)  TempSrc: Oral  SpO2: 100%  Weight: 206 lb 9.6 oz (93.7 kg)  Height: 5\' 1"  (1.549 m)   Physical Exam  Constitutional: She is oriented to person, place, and time. She appears well-developed and well-nourished. No distress.  HENT:  Head: Normocephalic and atraumatic.  Eyes: Conjunctivae are normal.  Cardiovascular: Normal rate, regular rhythm and normal heart sounds.  Respiratory: Effort normal and breath sounds  normal. No respiratory distress. She has no wheezes.  GI: Soft. Bowel sounds are normal. She exhibits no distension. There is no abdominal tenderness.  Musculoskeletal:        General: Edema (1+ bilaterally) present.     Comments: 3/5 muscle strength in lower extremities bilaterally  Neurological: She is alert and oriented to person, place, and time.  Skin: She is not diaphoretic.  Psychiatric: She has a normal mood and affect. Her behavior is normal. Judgment and thought content normal.   Assessment & Plan:   See Encounters Tab for problem based charting.  Patient discussed with Dr. Philipp Ovens

## 2019-06-21 NOTE — Patient Instructions (Signed)
It was a pleasure to see you today [name]. Please make the following changes:  Please follow up with cardiology on 9/30 Please continue taking all the medication indicated on this form  Please get MRI of your spine done when the pain decreases  I have collected blood from you today to assess how your liver is functioning  If you have any questions or concerns, please call our clinic at [phone] between 9am-5pm and after hours call [phone] and ask for the internal medicine resident on call. If you feel you are having a medical emergency please call 911.   Thank you, we look forward to help you remain healthy!  Lars Mage, MD Internal Medicine PGY3

## 2019-06-21 NOTE — Telephone Encounter (Signed)
Pt was seen today in [facility] for HFU.

## 2019-06-22 ENCOUNTER — Telehealth (HOSPITAL_COMMUNITY): Payer: Self-pay

## 2019-06-22 LAB — CMP14 + ANION GAP
ALT: 29 IU/L (ref 0–32)
AST: 16 IU/L (ref 0–40)
Albumin/Globulin Ratio: 1.4 (ref 1.2–2.2)
Albumin: 4.1 g/dL (ref 3.8–4.8)
Alkaline Phosphatase: 80 IU/L (ref 39–117)
Anion Gap: 15 mmol/L (ref 10.0–18.0)
BUN/Creatinine Ratio: 19 (ref 12–28)
BUN: 18 mg/dL (ref 8–27)
Bilirubin Total: 0.3 mg/dL (ref 0.0–1.2)
CO2: 21 mmol/L (ref 20–29)
Calcium: 10 mg/dL (ref 8.7–10.3)
Chloride: 104 mmol/L (ref 96–106)
Creatinine, Ser: 0.95 mg/dL (ref 0.57–1.00)
GFR calc Af Amer: 71 mL/min/{1.73_m2} (ref >59)
GFR calc non Af Amer: 62 mL/min/{1.73_m2} (ref >59)
Globulin, Total: 2.9 g/dL (ref 1.5–4.5)
Glucose: 220 mg/dL — ABNORMAL HIGH (ref 65–99)
Potassium: 4.3 mmol/L (ref 3.5–5.2)
Sodium: 140 mmol/L (ref 134–144)
Total Protein: 7 g/dL (ref 6.0–8.5)

## 2019-06-22 NOTE — Assessment & Plan Note (Signed)
Patient with limited rom in bilateral lower extremities. She states that she has pain in her hips and it radiates down her legs.   -MRI lumbar spine pending

## 2019-06-22 NOTE — Assessment & Plan Note (Addendum)
Patient was admitted from 9/6-9/10/20 for NSTEMI. LHC showed severe triple vessel CAD, severe stenosis of proximal LAD, severe stenosis in first obtuse marginal branch with moderately severe disease in circumflex. A PTCA with balloon angioplasty was done only in obtuse marginal branch. Of note, the patient was offered a CABG in 2018, but she refused due to needing blood products.   Patient was told to do DAPT with ASA and Brilinta for one year and continue beta blocker.  Assessment and plan  The patients vitals are stable, she is subjectively feeling well without any acute complaints.   -Follow up with cardiology -continue aspirin 81mg  qd  -continue brilinta 90mg  bid  -continue metoprolol 100mg  qd  -continue imdur 120mg  bid  -cardiac rehabilitation referral  -rosuvastatin 5mg  qd (will have to determine if there is a true statin intolerance) -patient would like to have CABG using cell savage machine. Her congregation is looking for physicians who are trained to perform surgeries using this.

## 2019-06-22 NOTE — Progress Notes (Signed)
Internal Medicine Clinic Attending  Case discussed with Dr. Chundi at the time of the visit.  We reviewed the resident's history and exam and pertinent patient test results.  I agree with the assessment, diagnosis, and plan of care documented in the resident's note. 

## 2019-06-22 NOTE — Telephone Encounter (Signed)
Pt insurance is active and benefits verified through Medicare A/B. Co-pay $0.00, DED $0.00/$0.00 met, out of pocket $0.00/$0.00 met, co-insurance 0%. No pre-authorization required. Passport, 06/19/2019 @ 12:09PM, VHQ#[id number]  Pt insurance is active through Medicaid. NUU#[id number]  Will fax over Medicaid Reimbursement form to Dr. Percival Spanish  Will contact patient to see if she is interested in the Cardiac Rehab Program. If interested, patient will need to complete follow up appt. Once completed, patient will be contacted for scheduling upon review by the RN Navigator.

## 2019-06-22 NOTE — Assessment & Plan Note (Signed)
   Patient was found to have transaminitis AST:ALT 403:247 during hospitalization. RUQ ultrasound showed diffuse hepatic steatosis without thrombosis or cholestasis. Hep B surface ag negative, surface ab 5.6 (suggesting poor immunity as range for immunity is >9.9), hcv ab 0.2.   Assessment and plan   -Repeat CMP to evaluate if transaminitis was transient and as a result of end organ damage in setting of NSTEMI

## 2019-06-25 ENCOUNTER — Other Ambulatory Visit: Payer: Self-pay | Admitting: Internal Medicine

## 2019-06-25 DIAGNOSIS — E1140 Type 2 diabetes mellitus with diabetic neuropathy, unspecified: Secondary | ICD-10-CM

## 2019-06-25 DIAGNOSIS — Z794 Long term (current) use of insulin: Secondary | ICD-10-CM

## 2019-06-27 MED FILL — NOVOLOG FLEXPEN SYRINGE: 100 | 83 days supply | Qty: 15 | Fill #2

## 2019-06-29 ENCOUNTER — Other Ambulatory Visit: Payer: Self-pay | Admitting: Cardiology

## 2019-06-29 ENCOUNTER — Other Ambulatory Visit: Payer: Self-pay | Admitting: Internal Medicine

## 2019-06-29 DIAGNOSIS — I5032 Chronic diastolic (congestive) heart failure: Secondary | ICD-10-CM

## 2019-06-29 MED FILL — ISOSORBIDE MONONITRATE ER 1: 120 | 60 days supply | Qty: 60 | Fill #0

## 2019-06-30 ENCOUNTER — Other Ambulatory Visit: Payer: Self-pay | Admitting: Cardiology

## 2019-06-30 MED FILL — SPIRONOLACTONE 25 MG TABS: 25 | 90 days supply | Qty: 180 | Fill #0

## 2019-07-02 NOTE — Progress Notes (Signed)
Cardiology Office Note   Date:  07/04/2019   ID:  [name], DOB Sep 03, 1951, MRN [id number]  PCP:  Bartholomew Crews, MD  Cardiologist: Dr. Percival Spanish  No chief complaint on file.    History of Present Illness: [name] is a 68 y.o. female who presents for ongoing assessment and management of CAD, most recent cardiac cath in the setting of NSTEMI,  revealing 3 vessel disease with cath on 06/14/2019 with severe stenosis of the proximal LAD, s/p PTCA/DES X 1 of proximal LAD; Severe stenosis of the 1st OM branch with severe disease in the Cx s/p successful PTCA with balloon angioplasty only of the OM branch. She was to continue DAPT with ASA and Brilinta for one year.    Other history of NIDDM, hyperlipidemia, transaminitis, with ultrasound revealing diffuse hepatic stenosis, NAFLD. HFpEF, breast cancer.   She comes today without any cardiac pain.  Her main complaint is lower extremity pain, spasms, and difficulty walking.  She was diagnosed with severe arthritis and diabetic neuropathy while recently hospitalized.  She was diagnosed with bursitis and treated for this with steroid injections.  She is continued to have pain in her legs but is finding some relief with Tylenol and gabapentin.  She walks in her apartment complex parking lot with her daughter every day.  She is looking for herbal remedies to assist with pain control as she does not wish to be on narcotics.  She denies bleeding, bruising, rapid heart rhythm, significant dyspnea on exertion, or fatigue.  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 S/p lumpectomy and radiation.   CAD (coronary artery disease) 11/07   cath 11/11 :  3V CADsee report    Cataract    small both eyes   Chronic lower back pain    Chronic pain    MR 08/07/10 :  Spondylosis L4-5 with B foraminal narrowing  and L4 nerve root enchroachment B.   Chronic venous insufficiency 2012   Has had full W/U incl ECHO, LFT's, Cr, Umicro, TSH, BNP. Symptomatic treatment.   Colonic polyp 1995   Colonoscopies 1994, 2000, and 02/02/2011. Last had 9 polyps - Tubular adenoma and tubulovillous was the pathology results without high grade dysplasia   Diabetes mellitus type II    Insulin dependent. Has worked with [name] R previously.   Diastolic CHF, chronic (HCC)    NL EF by echo 01/2012, Gr I dd   Essential hypertension    Requires 5 drug therapy and still difficult to control.   GERD (gastroesophageal reflux disease)    Heart murmur    History of radiation therapy 10/02/18- 11/09/18   Left chest wall SCV, Left chest wall boost, Right chest wall SCV, right chest boost all received 28 fractions for a total dose of 50.04 Gy   Hyperlipidemia    On daily statin   Iron deficiency anemia    Ferritin was 12 in 2012. on FeSo4. Has had colon and EGD 02/02/11 that showed mild gastritis and colon polyps.   Obesity    Morbid. HAs worked with [name] T previously.   OSA (obstructive sleep apnea) 02/16/2007   02/2007 : Moderate AHI 35.6, O2 sat decreased to 65%, CPAP titration to 16 with AHI of 3.6. 02/2014 : Rare respiratory events with sleep disturbance, within normal limits. AHI 0.4 per hour  OSA on CPAP    Personal history of chemotherapy    Personal history of radiation therapy    "to my left breast"   Refusal of blood transfusions as patient is Jehovah's Witness    Seasonal allergies    "grass pollen"    Past Surgical History:  Procedure Laterality Date   BREAST LUMPECTOMY Left 2006   BREAST LUMPECTOMY W/ NEEDLE LOCALIZATION  2006   34 Chemo RX   CARDIAC CATHETERIZATION N/A 08/15/2015   Procedure: Left Heart Cath and Coronary Angiography;  Surgeon: Troy Sine, MD;  Location: [facility];  Service: Cardiovascular;  Laterality: N/A;   COLONOSCOPY W/ BIOPSIES AND POLYPECTOMY  1994,  2000, 2012, 26/018   CORONARY STENT INTERVENTION N/A 06/13/2019   Procedure: CORONARY STENT INTERVENTION;  Surgeon: Burnell Blanks, MD;  Location: [facility];  Service: Cardiovascular;  Laterality: N/A;   IR IMAGING GUIDED PORT INSERTION  04/17/2018   IR REMOVAL TUN ACCESS W/ PORT W/O FL MOD SED  11/14/2018   LEFT HEART CATH AND CORONARY ANGIOGRAPHY N/A 08/19/2017   Procedure: LEFT HEART CATH AND CORONARY ANGIOGRAPHY;  Surgeon: Belva Crome, MD;  Location: [facility];  Service: Cardiovascular;  Laterality: N/A;   LEFT HEART CATH AND CORONARY ANGIOGRAPHY N/A 06/13/2019   Procedure: LEFT HEART CATH AND CORONARY ANGIOGRAPHY;  Surgeon: Burnell Blanks, MD;  Location: [facility];  Service: Cardiovascular;  Laterality: N/A;   MASTECTOMY MODIFIED RADICAL Bilateral 07/20/2018   MASTECTOMY MODIFIED RADICAL Bilateral 07/20/2018   Procedure: BILATERAL MODIFIED RADICAL MASTECTOMIES;  Surgeon: Jovita Kussmaul, MD;  Location: [facility];  Service: General;  Laterality: Bilateral;   PORTACATH PLACEMENT N/A 04/14/2018   Procedure: ATTEMPED INSERTION PORT-A-CATH;  Surgeon: Jovita Kussmaul, MD;  Location: [facility];  Service: General;  Laterality: N/A;   [place]   "I had endometrosis"   ULTRASOUND GUIDANCE FOR VASCULAR ACCESS  08/19/2017   Procedure: Ultrasound Guidance For Vascular Access;  Surgeon: Belva Crome, MD;  Location: [facility];  Service: Cardiovascular;;     Current Outpatient Medications  Medication Sig Dispense Refill   ACCU-CHEK FASTCLIX LANCETS MISC Check blood sugar 3 times a day (Patient taking differently: 1 each by Other route 3 (three) times daily. ) 102 each 5   ACCU-CHEK GUIDE test strip USE 1 STRIP THREE TIMES DAILY (Patient taking differently: 1 each by Other route 3 (three) times daily. ) 375 each 0   amLODipine (NORVASC) 10 MG tablet TAKE ONE (1) TABLET BY MOUTH  EVERY DAY (Patient taking differently: Take 10 mg by mouth daily. ) 90 tablet 3   anastrozole (ARIMIDEX) 1 MG tablet Take 1 tablet (1 mg total) by mouth daily. 90 tablet 3   aspirin EC 81 MG tablet Take 81 mg by mouth daily.     Coenzyme Q10 (CO Q 10) 100 MG CAPS Take 100 mg by mouth daily.      diclofenac (FLECTOR) 1.3 % PTCH Place 1 patch onto the skin 2 (two) times daily. 30 patch 5   furosemide (LASIX) 20 MG tablet TAKE 1 TABLET (20 MG TOTAL) BY MOUTH AS NEEDED FOR EDEMA. (Patient taking differently: Take 20 mg by mouth daily as needed for edema. ) 90 tablet 3   gabapentin (NEURONTIN) 300 MG capsule Take 1 capsule (300 mg total) by mouth at bedtime. 90 capsule 0   insulin aspart (NOVOLOG FLEXPEN) 100  UNIT/ML FlexPen Inject 6 Units into the skin 3 (three) times daily with meals. 15 mL 3   isosorbide mononitrate (IMDUR) 120 MG 24 hr tablet Take 1 tablet (120 mg total) by mouth 2 (two) times daily. 60 tablet 0   losartan (COZAAR) 50 MG tablet Take 1 tablet (50 mg total) by mouth daily. 90 tablet 3   metFORMIN (GLUCOPHAGE) 1000 MG tablet Take 1 tablet (1,000 mg total) by mouth 2 (two) times daily with a meal. 180 tablet 3   metoprolol succinate (TOPROL-XL) 100 MG 24 hr tablet TAKE ONE (1) TABLET BY MOUTH EVERY DAY (Patient taking differently: Take 100 mg by mouth daily. ) 90 tablet 3   nitroGLYCERIN (NITROSTAT) 0.4 MG SL tablet PLACE ONE TABLET UNDER THE TONGUE EVERY 5 MINUTES AS NEEDED FOR CHEST PAIN (Patient taking differently: Place 0.4 mg under the tongue every 5 (five) minutes as needed for chest pain. ) 25 tablet 7   Olopatadine HCl (PATADAY) 0.2 % SOLN Place 1 drop into both eyes daily as needed (for allergies).      omeprazole (PRILOSEC) 40 MG capsule Take 1 capsule (40 mg total) by mouth daily. (Patient taking differently: Take 40 mg by mouth daily as needed (for acid reflux). ) 30 capsule 5   pyridoxine (B-6) 100 MG tablet Take 100 mg by mouth daily.     RELION PEN NEEDLES  32G X 4 MM MISC USE FOR INJECTIONS UP TO 4 TIMES DAILY 200 each 0   rosuvastatin (CRESTOR) 5 MG tablet Take 1 tablet (5 mg total) by mouth daily. 30 tablet 11   spironolactone (ALDACTONE) 25 MG tablet Take 2 tablets (50 mg total) by mouth daily. 180 tablet 3   ticagrelor (BRILINTA) 90 MG TABS tablet Take 1 tablet (90 mg total) by mouth 2 (two) times daily. 60 tablet 11   TOUJEO SOLOSTAR 300 UNIT/ML SOPN INJECT 35 UNITS INTO THE SKIN DAILY (Patient taking differently: Inject 35 Units into the skin daily. ) 9 mL 11   vitamin B-12 (CYANOCOBALAMIN) 1000 MCG tablet Take 1,000 mcg by mouth daily.     No current facility-administered medications for this visit.     Allergies:   Blood-group specific substance, Lisinopril, Oxycodone-acetaminophen, Percocet [oxycodone-acetaminophen], Victoza [liraglutide], and Atorvastatin    Social History:  The patient  reports that she has never smoked. She has never used smokeless tobacco. She reports that she does not drink alcohol or use drugs.   Family History:  The patient's family history includes Breast cancer in her sister; Depression (age of onset: 55) in her daughter; Diabetes in her father; Heart attack (age of onset: 69) in her brother; Kidney failure in her brother; Lung cancer in her mother; Osteoarthritis in her maternal grandmother; Pulmonary embolism in her brother.    ROS: All other systems are reviewed and negative. Unless otherwise mentioned in H&P    PHYSICAL EXAM: VS:  BP (!) 111/58    Pulse 62    Ht 5\' 1"  (1.549 m)    Wt 202 lb 3.2 oz (91.7 kg)    LMP 07/12/1984    SpO2 97%    BMI 38.21 kg/m  , BMI Body mass index is 38.21 kg/m. GEN: Well nourished, well developed, in no acute distress, obese HEENT: normal Neck: no JVD, carotid bruits, or masses Cardiac: RRR; no murmurs, rubs, or gallops,no edema (wearing bilateral support hose) Respiratory:  Clear to auscultation bilaterally, normal work of breathing GI: soft, nontender,  nondistended, + BS MS: no deformity or  atrophy Skin: warm and dry, no rash Neuro:  Strength and sensation are intact Psych: euthymic mood, full affect   EKG: Sinus rhythm with first-degree AV block PR interval 220 ms, inferior T wave inversion with inversion in V5 and V6 as well although not as prominent.  Recent Labs: 06/10/2019: B Natriuretic Peptide 250.1; Magnesium 1.8; TSH 0.631 06/14/2019: Hemoglobin 10.5; Platelets 179 06/21/2019: ALT 29; BUN 18; Creatinine, Ser 0.95; Potassium 4.3; Sodium 140    Lipid Panel    Component Value Date/Time   CHOL 214 (H) 06/11/2019 0036   CHOL 185 11/04/2017 1041   TRIG 33 06/11/2019 0036   HDL 66 06/11/2019 0036   HDL 57 11/04/2017 1041   CHOLHDL 3.2 06/11/2019 0036   VLDL 7 06/11/2019 0036   LDLCALC 141 (H) 06/11/2019 0036   LDLCALC 112 (H) 11/04/2017 1041   LDLDIRECT 153 (H) 07/13/2011 1055      Wt Readings from Last 3 Encounters:  07/04/19 202 lb 3.2 oz (91.7 kg)  06/21/19 206 lb 9.6 oz (93.7 kg)  06/14/19 202 lb 13.2 oz (92 kg)      Other studies Reviewed: 2D Echo 06/11/19 1. The left ventricle has mildly reduced systolic function, with an ejection fraction of 45-50%. The cavity size was normal. Left ventricular diastolic Doppler parameters are consistent with pseudonormalization. Elevated left ventricular end-diastolic pressure Left ventricular diffuse hypokinesis. 2. The right ventricle has normal systolic function. The cavity was normal. There is no increase in right ventricular wall thickness. 3. There is mild to moderate mitral annular calcification present. 4. The aortic valve is tricuspid. Mild sclerosis of the aortic valve. Aortic valve regurgitation is trivial by color flow Doppler. 5. The aorta is normal unless otherwise noted.  LHC 06/13/19  Conclusion    Prox LAD lesion is 99% stenosed.  Ost 1st Mrg lesion is 99% stenosed.  Prox Cx lesion is 80% stenosed.  Mid Cx lesion is 90% stenosed.  Dist Cx lesion  is 95% stenosed.  Prox RCA lesion is 85% stenosed.  Prox RCA to Mid RCA lesion is 70% stenosed.  Ost Ramus to Ramus lesion is 75% stenosed.  Ost LM to Mid LM lesion is 40% stenosed.  A drug-eluting stent was successfully placed using a STENT SYNERGY DES 3X12.  Post intervention, there is a 0% residual stenosis.  Balloon angioplasty was performed using a BALLOON SAPPHIRE [place] 2.0X15.  Post intervention, there is a 70% residual stenosis.  1. Severe triple vessel CAD, previously turned down for CABG in 2018 due to refusal of blood products 2. Severe stenosis proximal LAD. Successful PTCA/DES x 1 proximal LAD 3. Severe stenosis in the first obtuse marginal branch with moderately severe disease in the Circumflex. Successful PTCA with balloon angioplasty only of the obtuse marginal branch.  4. Moderate disease in the small non-dominant RCA 5. Elevated filling pressures  Recommendations: Continue DAPT with ASA and Brilinta for one year. Continue beta blocker. She is statin intolerant.      ASSESSMENT AND PLAN:  1.  Coronary artery disease: Status post recent hospitalization with abnormal EKG and mild chest discomfort.  The majority of her symptoms were related to lower extremity pain and cramping but labs revealed abnormal troponin and EKG was also found to be abnormal.  Cardiac catheterization revealed 99% stenosis of multiple vessels.  She required PTCA and drug-eluting stent to the proximal LAD, EF of 45% to 50%..  She is currently on dual antiplatelet therapy with Brilinta and aspirin.  EF of she denies  any bleeding, excessive bruising, or shortness of breath.  2.  Hypertension: Excellent control of blood pressure today on amlodipine, losartan, isosorbide, spironolactone and metoprolol.  She continues on Lasix as well for fluid overload.  She denies any overt symptoms.  Religiously wears bilateral support hose.  She is maintaining her weight and doing her best to avoid salty foods.  Will  need to repeat echocardiogram on follow-up visit with reduced EF on optimal medical therapy.  3.  Hyperlipidemia: LDL dated 06/11/2019 was elevated at 141.  Goal of LDL less than 70 with known CAD.  She is currently on rosuvastatin 5 mg daily.  If she cannot tolerate higher doses due to worsening muscle pain, we may need to refer her to the lipid clinic for [place].  She states that she was having these leg cramps and pain prior to being started on statin therapy, and had had intolerances to statin therapy in the past.  Myalgia pain continued despite her stopping statin therapy.  She did have some transaminitis during hospitalization.  She was to have follow-up labs by primary care.  4.  NAFLD: Followed by primary care.  Labs on follow-up.  5.  Type 2 diabetes: Being followed by primary care.  Having some symptoms of diabetic neuropathy especially in her lower extremities.  She is also being treated for bursitis.  I have offered her ABIs to evaluate blood flow in her lower extremities with known history of CAD and multiple risk factors.  She is decided to wait on this and less pain becomes worse.  Current medicines are reviewed at length with the patient today.    Labs/ tests ordered today include: Lipids and LFTs, BMET, with follow-up echo to be scheduled on next office visit.  Phill Myron. West Pugh, ANP, AACC   07/04/2019 8:48 AM    [facility] [place] [address] Office [phone] Fax [phone]

## 2019-07-04 ENCOUNTER — Ambulatory Visit (INDEPENDENT_AMBULATORY_CARE_PROVIDER_SITE_OTHER): Payer: Medicare Other | Admitting: Adult Health

## 2019-07-04 ENCOUNTER — Other Ambulatory Visit: Payer: Self-pay

## 2019-07-04 ENCOUNTER — Encounter: Payer: Self-pay | Admitting: Adult Health

## 2019-07-04 VITALS — BP 111/58 | HR 62 | Ht 61.0 in | Wt 202.2 lb

## 2019-07-04 DIAGNOSIS — E11319 Type 2 diabetes mellitus with unspecified diabetic retinopathy without macular edema: Secondary | ICD-10-CM

## 2019-07-04 DIAGNOSIS — Z79899 Other long term (current) drug therapy: Secondary | ICD-10-CM

## 2019-07-04 DIAGNOSIS — E785 Hyperlipidemia, unspecified: Secondary | ICD-10-CM

## 2019-07-04 DIAGNOSIS — I10 Essential (primary) hypertension: Secondary | ICD-10-CM | POA: Diagnosis not present

## 2019-07-04 DIAGNOSIS — I214 Non-ST elevation (NSTEMI) myocardial infarction: Secondary | ICD-10-CM

## 2019-07-04 DIAGNOSIS — I5032 Chronic diastolic (congestive) heart failure: Secondary | ICD-10-CM | POA: Diagnosis not present

## 2019-07-04 DIAGNOSIS — E1165 Type 2 diabetes mellitus with hyperglycemia: Secondary | ICD-10-CM | POA: Diagnosis not present

## 2019-07-04 DIAGNOSIS — I2510 Atherosclerotic heart disease of native coronary artery without angina pectoris: Secondary | ICD-10-CM

## 2019-07-04 DIAGNOSIS — IMO0002 Reserved for concepts with insufficient information to code with codable children: Secondary | ICD-10-CM

## 2019-07-04 MED ORDER — LOSARTAN POTASSIUM 50 MG PO TABS: 50.0000 mg | ORAL_TABLET | Freq: Every day | ORAL | 3 refills | Status: AC

## 2019-07-04 MED ORDER — LOSARTAN POTASSIUM 50 MG PO TABS: 50.0000 mg | ORAL_TABLET | Freq: Every day | ORAL | 3 refills | Status: DC

## 2019-07-04 MED ORDER — TICAGRELOR 90 MG PO TABS: 90.0000 mg | ORAL_TABLET | Freq: Two times a day (BID) | ORAL | 3 refills | Status: AC

## 2019-07-04 MED ORDER — ISOSORBIDE MONONITRATE ER 120 MG PO TB24: 120.0000 mg | ORAL_TABLET | Freq: Two times a day (BID) | ORAL | 6 refills | Status: AC

## 2019-07-04 MED ORDER — TICAGRELOR 90 MG PO TABS: 90.0000 mg | ORAL_TABLET | Freq: Two times a day (BID) | ORAL | 11 refills | Status: DC

## 2019-07-04 MED ORDER — SPIRONOLACTONE 25 MG PO TABS: 50.0000 mg | ORAL_TABLET | Freq: Every day | ORAL | 3 refills | Status: DC

## 2019-07-04 MED ORDER — SPIRONOLACTONE 25 MG PO TABS: 50.0000 mg | ORAL_TABLET | Freq: Every day | ORAL | 3 refills | Status: AC

## 2019-07-04 MED FILL — LOSARTAN POTASSIUM 50 MG TA: 50 | 30 days supply | Qty: 30 | Fill #0

## 2019-07-04 MED FILL — BRILINTA 90 MG TABLET: 90 | 90 days supply | Qty: 180 | Fill #0

## 2019-07-04 NOTE — Patient Instructions (Signed)
Medication Instructions:  Continue current medications  If you need a refill on your cardiac medications before your next appointment, please call your pharmacy.  Labwork: Fasting lipids and liver, BMP in 3 Months HERE IN OUR OFFICE AT [facility] You will need to fast. DO NOT EAT OR DRINK PAST MIDNIGHT.       Take the provided lab slips with you to the lab for your blood draw.   When you have your labs (blood work) drawn today and your tests are completely normal, you will receive your results only by MyChart Message (if you have MyChart) -OR-  A paper copy in the mail.  If you have any lab test that is abnormal or we need to change your treatment, we will call you to review these results.  Testing/Procedures: None Ordered  Follow-Up: You will need a follow up appointment in 3 months.  Please call our office 2 months in advance to schedule this appointment.  You may see Minus Breeding, MD or one of the following Advanced Practice Providers on your designated Care Team:   Rosaria Ferries, PA-C . Jory Sims, DNP, ANP     At [facility], you and your health needs are our priority.  As part of our continuing mission to provide you with exceptional heart care, we have created designated Provider Care Teams.  These Care Teams include your primary Cardiologist (physician) and Advanced Practice Providers (APPs -  Physician Assistants and Nurse Practitioners) who all work together to provide you with the care you need, when you need it.  Thank you for choosing [facility] at [facility]!!

## 2019-07-04 NOTE — Addendum Note (Signed)
Addended by: Vennie Homans on: 07/04/2019 11:33 AM   Modules accepted: Orders

## 2019-07-05 ENCOUNTER — Other Ambulatory Visit: Payer: Self-pay | Admitting: Internal Medicine

## 2019-07-05 DIAGNOSIS — Z794 Long term (current) use of insulin: Secondary | ICD-10-CM

## 2019-07-05 DIAGNOSIS — E1140 Type 2 diabetes mellitus with diabetic neuropathy, unspecified: Secondary | ICD-10-CM

## 2019-07-06 ENCOUNTER — Other Ambulatory Visit: Payer: Self-pay | Admitting: Internal Medicine

## 2019-07-06 MED ORDER — INSULIN PEN NEEDLE 32G X 4 MM MISC: 1 refills | Status: DC

## 2019-07-06 NOTE — Telephone Encounter (Signed)
Needs refill on glucose blood (ACCU-CHEK GUIDE) test strip RELION PEN NEEDLES 32G X 4 MM MISC  ;pt contact [facility] (SE), [place] [facility] [place]

## 2019-07-10 MED FILL — FUROSEMIDE 20 MG TABS: 20 | 90 days supply | Qty: 90 | Fill #2

## 2019-07-11 MED FILL — UNIFINE PENTIPS 32GX5/32: 32G X 4 MM | 75 days supply | Qty: 300 | Fill #0

## 2019-07-11 MED FILL — UNIFINE PENTIPS 32GX5/32": 32G X 4 MM | 75 days supply | Qty: 300 | Fill #0

## 2019-07-20 ENCOUNTER — Inpatient Hospital Stay: Payer: Medicare Other | Attending: Adult Health | Admitting: Hematology and Oncology

## 2019-07-20 ENCOUNTER — Other Ambulatory Visit: Payer: Self-pay

## 2019-07-20 DIAGNOSIS — I2510 Atherosclerotic heart disease of native coronary artery without angina pectoris: Secondary | ICD-10-CM | POA: Diagnosis not present

## 2019-07-20 DIAGNOSIS — C50312 Malignant neoplasm of lower-inner quadrant of left female breast: Secondary | ICD-10-CM | POA: Insufficient documentation

## 2019-07-20 DIAGNOSIS — Z170 Estrogen receptor positive status [ER+]: Secondary | ICD-10-CM | POA: Diagnosis not present

## 2019-07-20 DIAGNOSIS — R262 Difficulty in walking, not elsewhere classified: Secondary | ICD-10-CM | POA: Insufficient documentation

## 2019-07-20 DIAGNOSIS — Z79811 Long term (current) use of aromatase inhibitors: Secondary | ICD-10-CM | POA: Insufficient documentation

## 2019-07-20 NOTE — Progress Notes (Signed)
Patient Care Team: Bartholomew Crews, MD as PCP - General (Internal Medicine) Minus Breeding, MD as PCP - Cardiology (Cardiology) Hayden Pedro, MD as Consulting Physician (Ophthalmology) Shirley Muscat Loreen Freud, MD as Referring Physician (Optometry) Minus Breeding, MD as Consulting Physician (Cardiology)  DIAGNOSIS:    ICD-10-CM   1. 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 S/p lumpectomy and radiation.   03/21/2018 Initial Diagnosis   Peau d' Orange skin anterior left breast: Ultrasound 7 o'clock position 2 cm from nipple irregular hypoechoic mass 2.2 x 2.5 x 2.5 cm (1 cm medial to prior lumpectomy); 12:00: Hypoechoic mass extending to skin 3.9 x 2.9 x 3.6 cm market skin thickening, no axillary lymph nodes   03/21/2018 Initial Biopsy   Left breast 12 o'clock position: IDC grade 3; 7 o'clock position: IDC grade 3, ER 40%, PR 0%, Ki-67 80%, HER-2 equivocal by FISH ratio 1.17 copy #4.4, IHC 1+ negative, T4d N0 stage IIIc clinical stage    04/20/2018 Imaging   IMPRESSION: 1. Multiple masses within the left breast (inflammatory breast carcinoma.) 2. Cortically thickened right axillary lymph node, indeterminate. Metastatic disease not excluded (patient refused biopsy)   05/09/2018 - 06/20/2018 Neo-Adjuvant Chemotherapy   Neoadjuvant chemotherapy with TC X 4   07/20/2018 Surgery   Bilateral mastectomies: Left mastectomy: Grade 3 IDC 6.5 cm, 1/2 lymph nodes positive, ER 40%, PR 0%, HER-2 negative, Ki-67 80%; right axillary lymph node 7/12 LN pos ER 0%, PR 0%, HER-2 negative, Ki-67 90% RCB class III; right mastectomy: Benign   08/02/2018 Cancer Staging   Staging form:  Breast, AJCC 8th Edition - Clinical: Stage IIIC (cT4d, cN0, cM0, G3, ER+, PR-, HER2-) - Signed by Gardenia Phlegm, NP on 08/02/2018   08/02/2018 Cancer Staging   Staging form: Breast, AJCC 8th Edition - Pathologic: No Stage Recommended (ypT4d, pN1a, cM0, G3, ER-, PR-, HER2-) - Signed by Eppie Gibson, MD on 09/15/2018   10/02/2018 - 11/09/2018 Radiation Therapy   Adjuvant XRT   12/21/2018 -  Anti-estrogen oral therapy   Anastrozole 84m daily, plan for 7 years     CHIEF COMPLIANT: Follow-up of recurrent left breast cancer on anastrozole  INTERVAL HISTORY: Holly BUFFONEis a 68y.o. with above-mentioned history of left breast cancer in 2006 with recurrence in the left breast and axilla who underwent neoadjuvant chemotherapy with Taxotere and Cytoxan followed by bilateral mastectomies and adjuvant radiation. She is currently on anti-estrogen therapy with anastrozole. She presents to the clinic today for follow-up.   REVIEW OF SYSTEMS:   Constitutional: Denies fevers, chills or abnormal weight loss Eyes: Denies blurriness of vision Ears, nose, mouth, throat, and face: Denies mucositis or sore throat Respiratory: Denies cough, dyspnea or wheezes Cardiovascular: Denies palpitation, chest discomfort Gastrointestinal: Denies nausea, heartburn or change in bowel habits Skin: Denies abnormal skin rashes Lymphatics: Denies new lymphadenopathy or easy bruising Neurological: Denies numbness, tingling or new weaknesses Behavioral/Psych: Mood is stable, no new changes  Extremities: No lower extremity edema Breast: denies any pain or lumps or nodules in either breasts All other systems were reviewed with the patient and are negative.  I have reviewed the past medical history, past surgical history, social history and  family history with the patient and they are unchanged from previous note.  ALLERGIES:  is allergic to blood-group specific substance; lisinopril; oxycodone-acetaminophen;  percocet [oxycodone-acetaminophen]; victoza [liraglutide]; and atorvastatin.  MEDICATIONS:  Current Outpatient Medications  Medication Sig Dispense Refill  . ACCU-CHEK FASTCLIX LANCETS MISC Check blood sugar 3 times a day (Patient taking differently: 1 each by Other route 3 (three) times daily. ) 102 each 5  . amLODipine (NORVASC) 10 MG tablet TAKE ONE (1) TABLET BY MOUTH EVERY DAY (Patient taking differently: Take 10 mg by mouth daily. ) 90 tablet 3  . anastrozole (ARIMIDEX) 1 MG tablet Take 1 tablet (1 mg total) by mouth daily. 90 tablet 3  . aspirin EC 81 MG tablet Take 81 mg by mouth daily.    . Coenzyme Q10 (CO Q 10) 100 MG CAPS Take 100 mg by mouth daily.     . diclofenac (FLECTOR) 1.3 % PTCH Place 1 patch onto the skin 2 (two) times daily. 30 patch 5  . furosemide (LASIX) 20 MG tablet TAKE 1 TABLET (20 MG TOTAL) BY MOUTH AS NEEDED FOR EDEMA. (Patient taking differently: Take 20 mg by mouth daily as needed for edema. ) 90 tablet 3  . gabapentin (NEURONTIN) 300 MG capsule Take 1 capsule (300 mg total) by mouth at bedtime. 90 capsule 0  . glucose blood (ACCU-CHEK GUIDE) test strip Use to check blood sugar 3 times daily. DIAG CODE E11.319. INSULIN DEPENDENT 300 each 1  . insulin aspart (NOVOLOG FLEXPEN) 100 UNIT/ML FlexPen Inject 6 Units into the skin 3 (three) times daily with meals. 15 mL 3  . Insulin Pen Needle 32G X 4 MM MISC Use to inject insulin 4 times daily under the skin. DIAG CODE E11.319. INSULIN DEPENDENT 380 each 1  . isosorbide mononitrate (IMDUR) 120 MG 24 hr tablet Take 1 tablet (120 mg total) by mouth 2 (two) times daily. 60 tablet 6  . losartan (COZAAR) 50 MG tablet Take 1 tablet (50 mg total) by mouth daily. 90 tablet 3  . metFORMIN (GLUCOPHAGE) 1000 MG tablet Take 1 tablet (1,000 mg total) by mouth 2 (two) times daily with a meal. 180 tablet 3  . metoprolol succinate (TOPROL-XL) 100 MG 24 hr tablet TAKE ONE (1) TABLET BY MOUTH EVERY DAY (Patient taking differently: Take 100  mg by mouth daily. ) 90 tablet 3  . nitroGLYCERIN (NITROSTAT) 0.4 MG SL tablet PLACE ONE TABLET UNDER THE TONGUE EVERY 5 MINUTES AS NEEDED FOR CHEST PAIN (Patient taking differently: Place 0.4 mg under the tongue every 5 (five) minutes as needed for chest pain. ) 25 tablet 7  . Olopatadine HCl (PATADAY) 0.2 % SOLN Place 1 drop into both eyes daily as needed (for allergies).     Marland Kitchen omeprazole (PRILOSEC) 40 MG capsule Take 1 capsule (40 mg total) by mouth daily. (Patient taking differently: Take 40 mg by mouth daily as needed (for acid reflux). ) 30 capsule 5  . pyridoxine (B-6) 100 MG tablet Take 100 mg by mouth daily.    . rosuvastatin (CRESTOR) 5 MG tablet Take 1 tablet (5 mg total) by mouth daily. 30 tablet 11  . spironolactone (ALDACTONE) 25 MG tablet Take 2 tablets (50 mg total) by mouth daily. 180 tablet 3  . ticagrelor (BRILINTA) 90 MG TABS tablet Take 1 tablet (90 mg total) by mouth 2 (two) times daily. 180 tablet 3  . TOUJEO SOLOSTAR 300 UNIT/ML SOPN INJECT 35 UNITS INTO THE SKIN DAILY (Patient taking differently: Inject 35 Units  into the skin daily. ) 9 mL 11  . vitamin B-12 (CYANOCOBALAMIN) 1000 MCG tablet Take 1,000 mcg by mouth daily.     No current facility-administered medications for this visit.     PHYSICAL EXAMINATION: ECOG PERFORMANCE STATUS: 1 - Symptomatic but completely ambulatory  There were no vitals filed for this visit. There were no vitals filed for this visit.  GENERAL: alert, no distress and comfortable SKIN: skin color, texture, turgor are normal, no rashes or significant lesions EYES: normal, Conjunctiva are pink and non-injected, sclera clear OROPHARYNX: no exudate, no erythema and lips, buccal mucosa, and tongue normal  NECK: supple, thyroid normal size, non-tender, without nodularity LYMPH: no palpable lymphadenopathy in the cervical, axillary or inguinal LUNGS: clear to auscultation and percussion with normal breathing effort HEART: regular rate & rhythm  and no murmurs and no lower extremity edema ABDOMEN: abdomen soft, non-tender and normal bowel sounds MUSCULOSKELETAL: no cyanosis of digits and no clubbing  NEURO: alert & oriented x 3 with fluent speech, no focal motor/sensory deficits EXTREMITIES: No lower extremity edema  LABORATORY DATA:  I have reviewed the data as listed CMP Latest Ref Rng & Units 06/21/2019 06/14/2019 06/13/2019  Glucose 65 - 99 mg/dL 220(H) 116(H) 167(H)  BUN 8 - 27 mg/dL _0 Creatinine 0.57 - 1.00 mg/dL 0.95 0.78 0.93  Sodium 134 - 144 mmol/L 140 136 138  Potassium 3.5 - 5.2 mmol/L 4.3 4.7 4.3  Chloride 96 - 106 mmol/L 104 107 110  CO2 20 - 29 mmol/L 21 20(L) 21(L)  Calcium 8.7 - 10.3 mg/dL 10.0 8.7(L) 8.8(L)  Total Protein 6.0 - 8.5 g/dL 7.0 7.4 -  Total Bilirubin 0.0 - 1.2 mg/dL 0.3 1.0 -  Alkaline Phos 39 - 117 IU/L 80 92 -  AST 0 - 40 IU/L 16 88(H) -  ALT 0 - 32 IU/L 29 122(H) -    Lab Results  Component Value Date   WBC 5.7 06/14/2019   HGB 10.5 (L) 06/14/2019   HCT 31.0 (L) 06/14/2019   MCV 90.1 06/14/2019   PLT 179 06/14/2019   NEUTROABS 6.3 06/12/2019    ASSESSMENT & PLAN:  Malignant neoplasm of lower-inner quadrant of left breast in female, estrogen receptor positive [facility]) 2006 June : 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 S/p lumpectomy and radiation.  03/21/2018:Peau [place] Orange skin anterior left breast: Ultrasound 7 o'clock position 2 cm from nipple irregular hypoechoic mass 2.2 x 2.5 x 2.5 cm (1 cm medial to prior lumpectomy); 12:00: Hypoechoic mass extending to skin 3.9 x 2.9 x 3.6 cm market skin thickening, no axillary lymph nodes  Left breast 12 o'clock position: IDC grade 3; 7 o'clock position: IDC grade 3, ER 40%, PR 0%, Ki-67 80%, HER-2 equivocal by FISH ratio 1.17 copy #4.4, IHC 1+ negative, T4d N0 stage IIIc clinical stage  Right breast and axillary ultrasound: Prominent right axillary lymph node.Biopsy revealed metastatic poorly differentiated  carcinoma ER 0%, PR 0%, Ki-67 90%  CT CAP 04/20/18:Breast masses and axillary lymph node, no metastatic disease elsewhere ----------------------------------------------------------------------------- Treatment summary: 1. Neoadjuvant chemotherapy withTaxotere Cytoxan x4cycles completed 06/20/2018(due to cardiac issues she was not a candidate for Adriamycin) 2. Bilateral mastectomies 07/20/2018 3. Followed by Xeloda-radiation completed 11/09/2018 4. Followed by antiestrogen therapy anastrozole 1 mg daily started 12/21/2018 ---------------------------------------------------------------------------- 07/20/2018:Bilateral mastectomies:  Left mastectomy: Grade 3 IDC 6.5 cm,1/2lymph nodes positive, ER 40%, PR 0%, HER-2 negative, Ki-67 80%;  right axillary lymph node7/12 PositiveER 0%, PR 0%, HER-2 negative,  Ki-67 90% RCB class III;  Right mastectomy: Benign ---------------------------------------------------- Current treatment: Anastrozole 1 mg daily started 12/21/2018  Anastrozole toxicities: Severe joint and muscle pains with difficulty ambulation.  She tells me that if she sits down her hip and leg will completely freeze and then she has to stretch it out before she can walk again.  Once she gets walking she does better.  This has been going on ever since we started her on anastrozole therapy.  Therefore I will discontinue anastrozole today.  She will stay off any antiestrogen therapy for 2 weeks and then I will touch base with her on the telephone.  If she is doing much better then we will switch her to letrozole at half a tablet daily. If it does not improve then I will refer her to orthopedics.  Hospitalization 06/10/2019-06/14/2019: Acute MI stent placement  I will see her back in 3 months in the clinic for follow-up.  No orders of the defined types were placed in this encounter.  The patient has a good understanding of the overall plan. she agrees with it. she will call with any  problems that may develop before the next visit here.  Nicholas Lose, MD 07/20/2019  Julious Oka Dorshimer am acting as scribe for Dr. Nicholas Lose.  I have reviewed the above documentation for accuracy and completeness, and I agree with the above.

## 2019-07-20 NOTE — Assessment & Plan Note (Signed)
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 S/p lumpectomy and radiation.  03/21/2018:Peau d[place] Orange skin anterior left breast: Ultrasound 7 o'clock position 2 cm from nipple irregular hypoechoic mass 2.2 x 2.5 x 2.5 cm (1 cm medial to prior lumpectomy); 12:00: Hypoechoic mass extending to skin 3.9 x 2.9 x 3.6 cm market skin thickening, no axillary lymph nodes  Left breast 12 o'clock position: IDC grade 3; 7 o'clock position: IDC grade 3, ER 40%, PR 0%, Ki-67 80%, HER-2 equivocal by FISH ratio 1.17 copy #4.4, IHC 1+ negative, T4d N0 stage IIIc clinical stage  Right breast and axillary ultrasound: Prominent right axillary lymph node.Biopsy revealed metastatic poorly differentiated carcinoma ER 0%, PR 0%, Ki-67 90%  CT CAP 04/20/18:Breast masses and axillary lymph node, no metastatic disease elsewhere ----------------------------------------------------------------------------- Treatment summary: 1. Neoadjuvant chemotherapy withTaxotere Cytoxan x4cycles completed 06/20/2018(due to cardiac issues she was not a candidate for Adriamycin) 2. Bilateral mastectomies 07/20/2018 3. Followed by Xeloda-radiation completed 11/09/2018 4. Followed by antiestrogen therapy anastrozole 1 mg daily started 12/21/2018 ---------------------------------------------------------------------------- 07/20/2018:Bilateral mastectomies:  Left mastectomy: Grade 3 IDC 6.5 cm,1/2lymph nodes positive, ER 40%, PR 0%, HER-2 negative, Ki-67 80%;  right axillary lymph node7/12 PositiveER 0%, PR 0%, HER-2 negative, Ki-67 90% RCB class III;  Right mastectomy: Benign ---------------------------------------------------- Current treatment: Anastrozole 1 mg daily started 12/21/2018  Anastrozole toxicities:  Hospitalization 06/10/2019-06/14/2019: Acute MI stent placement

## 2019-07-25 ENCOUNTER — Telehealth: Payer: Self-pay | Admitting: Hematology and Oncology

## 2019-07-25 NOTE — Telephone Encounter (Signed)
I left a message regarding schedule  

## 2019-07-26 ENCOUNTER — Ambulatory Visit (HOSPITAL_COMMUNITY): Payer: Medicare Other

## 2019-07-28 MED FILL — LOSARTAN POTASSIUM 50 MG TA: 50 | 30 days supply | Qty: 30 | Fill #1

## 2019-07-31 ENCOUNTER — Ambulatory Visit (HOSPITAL_COMMUNITY): Payer: Medicare Other

## 2019-08-02 ENCOUNTER — Ambulatory Visit (HOSPITAL_COMMUNITY): Payer: Medicare Other

## 2019-08-02 ENCOUNTER — Telehealth (HOSPITAL_COMMUNITY): Payer: Self-pay | Admitting: Pharmacist

## 2019-08-02 NOTE — Progress Notes (Signed)
HEMATOLOGY-ONCOLOGY TELEPHONE VISIT PROGRESS NOTE  I connected with [name] on 08/03/2019 at  8:30 AM EDT by telephone and verified that I am speaking with the correct person using two identifiers.  I discussed the limitations, risks, security and privacy concerns of performing an evaluation and management service by telephone and the availability of in person appointments.  I also discussed with the patient that there may be a patient responsible charge related to this service. The patient expressed understanding and agreed to proceed.   History of Present Illness: [name] is a 68 y.o. female with above-mentioned history of left breast cancer in 2006 with recurrence in the left breast and axilla who underwent neoadjuvant chemotherapy with Taxotere and Cytoxan followed by bilateral mastectomies and adjuvant radiation. She was on anti-estrogen therapy with anastrozole, but it was discontinued two weeks ago due to myalgias and joint stiffness. She presents over the phone today for follow-up.  Joint pains markedly improved after stopping anastrozole.  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 S/p lumpectomy and radiation.   03/21/2018 Initial Diagnosis   [place] skin anterior left breast: Ultrasound 7 o'clock position 2 cm from nipple irregular hypoechoic mass 2.2 x 2.5 x 2.5 cm (1 cm medial to prior lumpectomy); 12:00: Hypoechoic mass extending to skin 3.9 x 2.9 x 3.6 cm market skin thickening, no axillary lymph nodes   03/21/2018 Initial Biopsy   Left breast 12 o'clock position: IDC grade 3; 7 o'clock position: IDC grade 3, ER 40%, PR 0%, Ki-67 80%, HER-2 equivocal by FISH ratio 1.17 copy #4.4, IHC 1+ negative, T4d N0 stage IIIc clinical stage    04/20/2018 Imaging   IMPRESSION: 1. Multiple masses within the left  breast (inflammatory breast carcinoma.) 2. Cortically thickened right axillary lymph node, indeterminate. Metastatic disease not excluded (patient refused biopsy)   05/09/2018 - 06/20/2018 Neo-Adjuvant Chemotherapy   Neoadjuvant chemotherapy with TC X 4   07/20/2018 Surgery   Bilateral mastectomies: Left mastectomy: Grade 3 IDC 6.5 cm, 1/2 lymph nodes positive, ER 40%, PR 0%, HER-2 negative, Ki-67 80%; right axillary lymph node 7/12 LN pos ER 0%, PR 0%, HER-2 negative, Ki-67 90% RCB class III; right mastectomy: Benign   08/02/2018 Cancer Staging   Staging form: Breast, AJCC 8th Edition - Clinical: Stage IIIC (cT4d, cN0, cM0, G3, ER+, PR-, HER2-) - Signed by Gardenia Phlegm, NP on 08/02/2018   08/02/2018 Cancer Staging   Staging form: Breast, AJCC 8th Edition - Pathologic: No Stage Recommended (ypT4d, pN1a, cM0, G3, ER-, PR-, HER2-) - Signed by Eppie Gibson, MD on 09/15/2018   10/02/2018 - 11/09/2018 Radiation Therapy   Adjuvant XRT   12/21/2018 -  Anti-estrogen oral therapy   Anastrozole '1mg'$  daily, plan for 7 years, discontinued 07/20/19 due to myalgias and joint stiffness     Observations/Objective:  Joint pains have improved.   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 S/p lumpectomy and radiation.  03/21/2018:[place] skin anterior left breast: Ultrasound 7 o'clock position 2 cm from nipple irregular hypoechoic mass 2.2 x 2.5 x 2.5 cm (1 cm medial to prior lumpectomy); 12:00: Hypoechoic mass extending to skin 3.9 x 2.9 x 3.6 cm market skin thickening, no axillary lymph  nodes  Left breast 12 o'clock position: IDC grade 3; 7 o'clock position: IDC grade 3, ER 40%, PR 0%, Ki-67 80%, HER-2 equivocal by FISH ratio 1.17 copy #4.4, IHC 1+ negative, T4d N0 stage IIIc clinical stage  Right breast and axillary ultrasound:  Prominent right axillary lymph node.Biopsy revealed metastatic poorly differentiated carcinoma ER 0%, PR 0%, Ki-67 90%  CT CAP 04/20/18:Breast masses and axillary lymph node, no metastatic disease elsewhere ----------------------------------------------------------------------------- Treatment summary: 1. Neoadjuvant chemotherapy withTaxotere Cytoxan x4cycles completed 06/20/2018(due to cardiac issues she was not a candidate for Adriamycin) 2. Bilateral mastectomies 07/20/2018 3. Followed by Xeloda-radiation completed 11/09/2018 4. Followed by antiestrogen therapyanastrozole 1 mg daily started 12/21/2018 ---------------------------------------------------------------------------- 07/20/2018:Bilateral mastectomies:  Left mastectomy: Grade 3 IDC 6.5 cm,1/2lymph nodes positive, ER 40%, PR 0%, HER-2 negative, Ki-67 80%;  right axillary lymph node7/12 PositiveER 0%, PR 0%, HER-2 negative, Ki-67 90% RCB class III;  Right mastectomy: Benign ---------------------------------------------------- Current treatment:Anastrozole 1 mg daily started 12/21/2018 discontinued 07/20/2019 due to Severe joint and muscle pains with difficulty ambulation.   Hospitalization 06/10/2019-06/14/2019: Acute MI stent placement Treatment plan: Since she improved significantly, we will switch her to letrozole half a tablet daily. I discussed with her that if she develops muscle aches and pains with letrozole then she is to call me sooner. Follow-up visit in 1 month to assess tolerability to letrozole   I discussed the assessment and treatment plan with the patient. The patient was provided an opportunity to ask questions and all were answered. The patient agreed with the plan and demonstrated an understanding of the instructions. The patient was advised to call back or seek an in-person evaluation if the symptoms worsen or if the condition fails to improve as anticipated.   I provided 12 minutes of  non-face-to-face time during this encounter.   Rulon Eisenmenger, MD 08/03/2019    I, Molly Dorshimer, am acting as scribe for Nicholas Lose, MD.  I have reviewed the above documentation for accuracy and completeness, and I agree with the above.

## 2019-08-03 ENCOUNTER — Telehealth: Payer: Self-pay | Admitting: Hematology and Oncology

## 2019-08-03 ENCOUNTER — Inpatient Hospital Stay (HOSPITAL_BASED_OUTPATIENT_CLINIC_OR_DEPARTMENT_OTHER): Payer: Medicare Other | Admitting: Hematology and Oncology

## 2019-08-03 DIAGNOSIS — Z170 Estrogen receptor positive status [ER+]: Secondary | ICD-10-CM | POA: Diagnosis not present

## 2019-08-03 DIAGNOSIS — C50312 Malignant neoplasm of lower-inner quadrant of left female breast: Secondary | ICD-10-CM

## 2019-08-03 MED ORDER — LETROZOLE 2.5 MG PO TABS: 1.2500 mg | ORAL_TABLET | Freq: Every day | ORAL | 0 refills | Status: DC

## 2019-08-03 MED FILL — LETROZOLE 2.5 MG TABLET: 2.5 | 60 days supply | Qty: 30 | Fill #0

## 2019-08-03 NOTE — Assessment & Plan Note (Signed)
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 S/p lumpectomy and radiation.  03/21/2018:Peau d' Orange skin anterior left breast: Ultrasound 7 o'clock position 2 cm from nipple irregular hypoechoic mass 2.2 x 2.5 x 2.5 cm (1 cm medial to prior lumpectomy); 12:00: Hypoechoic mass extending to skin 3.9 x 2.9 x 3.6 cm market skin thickening, no axillary lymph nodes  Left breast 12 o'clock position: IDC grade 3; 7 o'clock position: IDC grade 3, ER 40%, PR 0%, Ki-67 80%, HER-2 equivocal by FISH ratio 1.17 copy #4.4, IHC 1+ negative, T4d N0 stage IIIc clinical stage  Right breast and axillary ultrasound: Prominent right axillary lymph node.Biopsy revealed metastatic poorly differentiated carcinoma ER 0%, PR 0%, Ki-67 90%  CT CAP 04/20/18:Breast masses and axillary lymph node, no metastatic disease elsewhere ----------------------------------------------------------------------------- Treatment summary: 1. Neoadjuvant chemotherapy withTaxotere Cytoxan x4cycles completed 06/20/2018(due to cardiac issues she was not a candidate for Adriamycin) 2. Bilateral mastectomies 07/20/2018 3. Followed by Xeloda-radiation completed 11/09/2018 4. Followed by antiestrogen therapyanastrozole 1 mg daily started 12/21/2018 ---------------------------------------------------------------------------- 07/20/2018:Bilateral mastectomies:  Left mastectomy: Grade 3 IDC 6.5 cm,1/2lymph nodes positive, ER 40%, PR 0%, HER-2 negative, Ki-67 80%;  right axillary lymph node7/12 PositiveER 0%, PR 0%, HER-2 negative, Ki-67 90% RCB class III;  Right mastectomy: Benign ---------------------------------------------------- Current treatment:Anastrozole 1 mg daily started 12/21/2018 discontinued 07/20/2019 due to Severe joint and muscle pains with difficulty ambulation.   Hospitalization 06/10/2019-06/14/2019: Acute MI stent placement Treatment plan: If she improved significantly then we will  switch her to letrozole half a tablet daily.  Follow-up visit in 1 month to assess tolerability to letrozole

## 2019-08-03 NOTE — Telephone Encounter (Signed)
Cardiac Rehab Medication Review by a Pharmacist  Does the patient  feel that his/her medications are working for him/her?  no  Has the patient been experiencing any side effects to the medications prescribed?  no  Does the patient measure his/her own blood pressure or blood glucose at home?  Yes, every day  Does the patient have any problems obtaining medications due to transportation or finances?   no  Understanding of regimen: fair Understanding of indications: good Potential of compliance: good  Pharmacist comments: Patient expresses that she does not have any refills for her Crestor but that she has a future appointment scheduled with her doctor.  Richardine Service, PharmD PGY1 Pharmacy Resident Phone: [phone] 08/03/2019  9:55 AM

## 2019-08-03 NOTE — Telephone Encounter (Signed)
I talk with patient regarding schedule  

## 2019-08-07 ENCOUNTER — Encounter (HOSPITAL_COMMUNITY): Payer: Self-pay

## 2019-08-07 ENCOUNTER — Encounter (HOSPITAL_COMMUNITY)
Admission: RE | Admit: 2019-08-07 | Discharge: 2019-08-07 | Disposition: A | Discharge status: Home / Self Care | Payer: Medicare Other | Source: Ambulatory Visit | Attending: Cardiology | Admitting: Cardiology

## 2019-08-07 ENCOUNTER — Ambulatory Visit (HOSPITAL_COMMUNITY): Payer: Medicare Other

## 2019-08-07 ENCOUNTER — Other Ambulatory Visit: Payer: Self-pay

## 2019-08-07 VITALS — BP 114/70 | HR 76 | Temp 96.3°F | Ht 60.0 in | Wt 202.6 lb

## 2019-08-07 DIAGNOSIS — Z955 Presence of coronary angioplasty implant and graft: Secondary | ICD-10-CM

## 2019-08-07 DIAGNOSIS — I214 Non-ST elevation (NSTEMI) myocardial infarction: Secondary | ICD-10-CM | POA: Diagnosis not present

## 2019-08-07 NOTE — Progress Notes (Signed)
Cardiac Individual Treatment Plan  Patient Details  Name: [name] MRN: [id number] Date of Birth: 1951/09/14 Referring Provider:     CARDIAC REHAB PHASE II ORIENTATION from 08/07/2019 in [place]  Referring Provider  Dr. Percival Spanish      Initial Encounter Date:    CARDIAC REHAB PHASE II ORIENTATION from 08/07/2019 in [facility]  Date  08/07/19      Visit Diagnosis: NSTEMI (non-ST elevated myocardial infarction) ([place]) 06/10/19  S/P coronary artery stent placement S/P DES LAD 06/13/19  Patient's Home Medications on Admission:  Current Outpatient Medications:  .  ACCU-CHEK FASTCLIX LANCETS MISC, Check blood sugar 3 times a day (Patient taking differently: 1 each by Other route 3 (three) times daily. ), Disp: 102 each, Rfl: 5 .  amLODipine (NORVASC) 10 MG tablet, TAKE ONE (1) TABLET BY MOUTH EVERY DAY (Patient taking differently: Take 10 mg by mouth daily. ), Disp: 90 tablet, Rfl: 3 .  aspirin EC 81 MG tablet, Take 81 mg by mouth daily., Disp: , Rfl:  .  Coenzyme Q10 (CO Q 10) 100 MG CAPS, Take 100 mg by mouth daily. , Disp: , Rfl:  .  diclofenac (FLECTOR) 1.3 % PTCH, Place 1 patch onto the skin 2 (two) times daily., Disp: 30 patch, Rfl: 5 .  furosemide (LASIX) 20 MG tablet, TAKE 1 TABLET (20 MG TOTAL) BY MOUTH AS NEEDED FOR EDEMA. (Patient taking differently: Take 20 mg by mouth daily as needed for edema. ), Disp: 90 tablet, Rfl: 3 .  gabapentin (NEURONTIN) 300 MG capsule, Take 1 capsule (300 mg total) by mouth at bedtime., Disp: 90 capsule, Rfl: 0 .  glucose blood (ACCU-CHEK GUIDE) test strip, Use to check blood sugar 3 times daily. DIAG CODE E11.319. INSULIN DEPENDENT, Disp: 300 each, Rfl: 1 .  insulin aspart (NOVOLOG FLEXPEN) 100 UNIT/ML FlexPen, Inject 6 Units into the skin 3 (three) times daily with meals., Disp: 15 mL, Rfl: 3 .  Insulin Pen Needle 32G X 4 MM MISC, Use to inject insulin 4 times daily under the skin.  DIAG CODE E11.319. INSULIN DEPENDENT, Disp: 380 each, Rfl: 1 .  isosorbide mononitrate (IMDUR) 120 MG 24 hr tablet, Take 1 tablet (120 mg total) by mouth 2 (two) times daily., Disp: 60 tablet, Rfl: 6 .  letrozole (FEMARA) 2.5 MG tablet, Take 0.5 tablets (1.25 mg total) by mouth daily., Disp: 30 tablet, Rfl: 0 .  losartan (COZAAR) 50 MG tablet, Take 1 tablet (50 mg total) by mouth daily., Disp: 90 tablet, Rfl: 3 .  metFORMIN (GLUCOPHAGE) 1000 MG tablet, Take 1 tablet (1,000 mg total) by mouth 2 (two) times daily with a meal., Disp: 180 tablet, Rfl: 3 .  metoprolol succinate (TOPROL-XL) 100 MG 24 hr tablet, TAKE ONE (1) TABLET BY MOUTH EVERY DAY (Patient taking differently: Take 100 mg by mouth daily. ), Disp: 90 tablet, Rfl: 3 .  Multiple Vitamins-Minerals (CENTRUM SILVER PO), Take 1 tablet by mouth daily., Disp: , Rfl:  .  nitroGLYCERIN (NITROSTAT) 0.4 MG SL tablet, PLACE ONE TABLET UNDER THE TONGUE EVERY 5 MINUTES AS NEEDED FOR CHEST PAIN (Patient taking differently: Place 0.4 mg under the tongue every 5 (five) minutes as needed for chest pain. ), Disp: 25 tablet, Rfl: 7 .  Olopatadine HCl (PATADAY) 0.2 % SOLN, Place 1 drop into both eyes daily as needed (for allergies). , Disp: , Rfl:  .  omeprazole (PRILOSEC) 40 MG capsule, Take 1 capsule (40 mg  total) by mouth daily. (Patient taking differently: Take 40 mg by mouth daily as needed (for acid reflux). ), Disp: 30 capsule, Rfl: 5 .  pyridoxine (B-6) 100 MG tablet, Take 100 mg by mouth daily., Disp: , Rfl:  .  rosuvastatin (CRESTOR) 5 MG tablet, Take 1 tablet (5 mg total) by mouth daily., Disp: 30 tablet, Rfl: 11 .  spironolactone (ALDACTONE) 25 MG tablet, Take 2 tablets (50 mg total) by mouth daily., Disp: 180 tablet, Rfl: 3 .  ticagrelor (BRILINTA) 90 MG TABS tablet, Take 1 tablet (90 mg total) by mouth 2 (two) times daily., Disp: 180 tablet, Rfl: 3 .  TOUJEO SOLOSTAR 300 UNIT/ML SOPN, INJECT 35 UNITS INTO THE SKIN DAILY (Patient taking differently:  Inject 35 Units into the skin daily. ), Disp: 9 mL, Rfl: 11 .  vitamin B-12 (CYANOCOBALAMIN) 1000 MCG tablet, Take 1,000 mcg by mouth daily., Disp: , Rfl:   Past Medical History: Past Medical History:  Diagnosis Date  . Arthritis    "knees, lower back" (08/18/2017)  . 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 S/p lumpectomy and radiation.  Marland Kitchen CAD (coronary artery disease) 11/07   cath 11/11 :  3V CADsee report   . Cataract    small both eyes  . Chronic lower back pain   . Chronic pain    MR 08/07/10 :  Spondylosis L4-5 with B foraminal narrowing and L4 nerve root enchroachment B.  . Chronic venous insufficiency 2012   Has had full W/U incl ECHO, LFT's, Cr, Umicro, TSH, BNP. Symptomatic treatment.  . Colonic polyp 1995   Colonoscopies 1994, 2000, and 02/02/2011. Last had 9 polyps - Tubular adenoma and tubulovillous was the pathology results without high grade dysplasia  . Diabetes mellitus type II    Insulin dependent. Has worked with [name] R previously.  . Diastolic CHF, chronic ([place])    NL EF by echo 01/2012, Gr I dd  . Essential hypertension    Requires 5 drug therapy and still difficult to control.  Marland Kitchen GERD (gastroesophageal reflux disease)   . Heart murmur   . History of radiation therapy 10/02/18- 11/09/18   Left chest wall SCV, Left chest wall boost, Right chest wall SCV, right chest boost all received 28 fractions for a total dose of 50.04 Gy  . Hyperlipidemia    On daily statin  . Iron deficiency anemia    Ferritin was 12 in 2012. on FeSo4. Has had colon and EGD 02/02/11 that showed mild gastritis and colon polyps.  . Obesity    Morbid. HAs worked with [name] T previously.  . OSA (obstructive sleep apnea) 02/16/2007   02/2007 : Moderate AHI 35.6, O2 sat decreased to 65%, CPAP titration to 16 with AHI of 3.6. 02/2014 : Rare respiratory events with sleep disturbance, within normal limits. AHI 0.4 per hour      . OSA on CPAP   .  Personal history of chemotherapy   . Personal history of radiation therapy    "to my left breast"  . Refusal of blood transfusions as patient is Jehovah's Witness   . Seasonal allergies    "grass pollen"    Tobacco Use: Social History   Tobacco Use  Smoking Status Never Smoker  Smokeless Tobacco Never Used    Labs: Recent Review Flowsheet Data    Labs for ITP Cardiac and Pulmonary Rehab Latest Ref Rng & Units 07/20/2018 07/20/2018 11/30/2018 05/31/2019 06/11/2019   Cholestrol  0 - 200 mg/dL - - - - 214(H)   LDLCALC 0 - 99 mg/dL - - - - 141(H)   LDLDIRECT mg/dL - - - - -   HDL >40 mg/dL - - - - 66   Trlycerides <150 mg/dL - - - - 33   Hemoglobin A1c 4.0 - 5.6 % - - 8.1(A) 8.1(A) -   PHART 7.350 - 7.450 7.360 7.381 - - -   PCO2ART 32.0 - 48.0 mmHg 43.1 38.0 - - -   HCO3 20.0 - 28.0 mmol/L 24.3 22.6 - - -   TCO2 22 - 32 mmol/L 26 24 - - -   ACIDBASEDEF 0.0 - 2.0 mmol/L 1.0 2.0 - - -   O2SAT % 70.0 100.0 - - -      Capillary Blood Glucose: Lab Results  Component Value Date   GLUCAP 141 (H) 06/14/2019   GLUCAP 169 (H) 06/14/2019   GLUCAP 140 (H) 06/14/2019   GLUCAP 130 (H) 06/13/2019   GLUCAP 121 (H) 06/13/2019     Exercise Target Goals: Exercise Program Goal: Individual exercise prescription set using results from initial 6 min walk test and THRR while considering  patient's activity barriers and safety.   Exercise Prescription Goal: Starting with aerobic activity 30 plus minutes a day, 3 days per week for initial exercise prescription. Provide home exercise prescription and guidelines that participant acknowledges understanding prior to discharge.  Activity Barriers & Risk Stratification: Activity Barriers & Cardiac Risk Stratification - 08/07/19 1117      Activity Barriers & Cardiac Risk Stratification   Activity Barriers  Arthritis;Back Problems;Muscular Weakness;Deconditioning;Assistive Device;Balance Concerns    Cardiac Risk Stratification  High       6 Minute  Walk: 6 Minute Walk    Row Name 08/07/19 1117         6 Minute Walk   Phase  Initial     Distance  630 feet     Walk Time  6 minutes     # of Rest Breaks  0     MPH  1.1     METS  1.1     RPE  7     Perceived Dyspnea   0     VO2 Peak  3.9     Symptoms  No     Resting HR  76 bpm     Resting BP  114/70     Resting Oxygen Saturation   100 %     Exercise Oxygen Saturation  during 6 min walk  99 %     Max Ex. HR  100 bpm     Max Ex. BP  130/70     2 Minute Post BP  118/70        Oxygen Initial Assessment:   Oxygen Re-Evaluation:   Oxygen Discharge (Final Oxygen Re-Evaluation):   Initial Exercise Prescription: Initial Exercise Prescription - 08/07/19 1100      Date of Initial Exercise RX and Referring Provider   Date  08/07/19    Referring Provider  Dr. Percival Spanish    Expected Discharge Date  10/03/19      NuStep   Level  2    SPM  75    Minutes  30    METs  1.5      Prescription Details   Frequency (times per week)  3    Duration  Progress to 30 minutes of continuous aerobic without signs/symptoms of physical distress      Intensity  THRR 40-80% of Max Heartrate  61-122    Ratings of Perceived Exertion  11-13      Progression   Progression  Continue to progress workloads to maintain intensity without signs/symptoms of physical distress.      Resistance Training   Training Prescription  Yes    Weight  2 lbs.     Reps  10-15       Perform Capillary Blood Glucose checks as needed.  Exercise Prescription Changes:   Exercise Comments:   Exercise Goals and Review:  Exercise Goals    Row Name 08/07/19 1120             Exercise Goals   Increase Physical Activity  Yes       Intervention  Provide advice, education, support and counseling about physical activity/exercise needs.;Develop an individualized exercise prescription for aerobic and resistive training based on initial evaluation findings, risk stratification, comorbidities and participant's  personal goals.       Expected Outcomes  Short Term: Attend rehab on a regular basis to increase amount of physical activity.;Long Term: Add in home exercise to make exercise part of routine and to increase amount of physical activity.;Long Term: Exercising regularly at least 3-5 days a week.       Increase Strength and Stamina  Yes       Intervention  Provide advice, education, support and counseling about physical activity/exercise needs.;Develop an individualized exercise prescription for aerobic and resistive training based on initial evaluation findings, risk stratification, comorbidities and participant's personal goals.       Expected Outcomes  Short Term: Increase workloads from initial exercise prescription for resistance, speed, and METs.;Short Term: Perform resistance training exercises routinely during rehab and add in resistance training at home;Long Term: Improve cardiorespiratory fitness, muscular endurance and strength as measured by increased METs and functional capacity (6MWT)       Able to understand and use rate of perceived exertion (RPE) scale  Yes       Intervention  Provide education and explanation on how to use RPE scale       Expected Outcomes  Short Term: Able to use RPE daily in rehab to express subjective intensity level;Long Term:  Able to use RPE to guide intensity level when exercising independently       Knowledge and understanding of Target Heart Rate Range (THRR)  Yes       Intervention  Provide education and explanation of THRR including how the numbers were predicted and where they are located for reference       Expected Outcomes  Short Term: Able to state/look up THRR;Long Term: Able to use THRR to govern intensity when exercising independently;Short Term: Able to use daily as guideline for intensity in rehab       Able to check pulse independently  Yes       Intervention  Provide education and demonstration on how to check pulse in carotid and radial  arteries.;Review the importance of being able to check your own pulse for safety during independent exercise       Expected Outcomes  Short Term: Able to explain why pulse checking is important during independent exercise;Long Term: Able to check pulse independently and accurately       Understanding of Exercise Prescription  Yes       Intervention  Provide education, explanation, and written materials on patient's individual exercise prescription       Expected Outcomes  Short Term: Able to explain program  exercise prescription;Long Term: Able to explain home exercise prescription to exercise independently          Exercise Goals Re-Evaluation :    Discharge Exercise Prescription (Final Exercise Prescription Changes):   Nutrition:  Target Goals: Understanding of nutrition guidelines, daily intake of sodium 1500mg , cholesterol 200mg , calories 30% from fat and 7% or less from saturated fats, daily to have 5 or more servings of fruits and vegetables.  Biometrics: Pre Biometrics - 08/07/19 1121      Pre Biometrics   Height  5' (1.524 m)    Weight  91.9 kg    Waist Circumference  42 inches    Hip Circumference  50.5 inches    Waist to Hip Ratio  0.83 %    BMI (Calculated)  39.57    Triceps Skinfold  70 mm    % Body Fat  54 %    Grip Strength  27 kg    Flexibility  0 in    Single Leg Stand  0 seconds        Nutrition Therapy Plan and Nutrition Goals:   Nutrition Assessments:   Nutrition Goals Re-Evaluation:   Nutrition Goals Discharge (Final Nutrition Goals Re-Evaluation):   Psychosocial: Target Goals: Acknowledge presence or absence of significant depression and/or stress, maximize coping skills, provide positive support system. Participant is able to verbalize types and ability to use techniques and skills needed for reducing stress and depression.  Initial Review & Psychosocial Screening: Initial Psych Review & Screening - 08/07/19 1032      Initial Review    Current issues with  None Identified      Family Dynamics   Good Support System?  Yes   Halynn has her two daughters and grandchildren for support     Barriers   Psychosocial barriers to participate in program  There are no identifiable barriers or psychosocial needs.      Screening Interventions   Interventions  Encouraged to exercise       Quality of Life Scores: Quality of Life - 08/07/19 1121      Quality of Life   Select  Quality of Life      Quality of Life Scores   Health/Function Pre  27.89 %    Socioeconomic Pre  27.25 %    Psych/Spiritual Pre  27.5 %    Family Pre  24.5 %    GLOBAL Pre  27.15 %      Scores of 19 and below usually indicate a poorer quality of life in these areas.  A difference of  2-3 points is a clinically meaningful difference.  A difference of 2-3 points in the total score of the Quality of Life Index has been associated with significant improvement in overall quality of life, self-image, physical symptoms, and general health in studies assessing change in quality of life.  PHQ-9: Recent Review Flowsheet Data    Depression screen [facility] 2/9 08/07/2019 06/21/2019 06/05/2019 05/31/2019 11/30/2018   Decreased Interest 0 0 0 0 0    Down, Depressed, Hopeless 0 0 0 1 0   PHQ - 2 Score 0 0 0 1 0   Altered sleeping - - - 1 0   Tired, decreased energy - - - 0 0   Change in appetite - - - 0 0   Feeling bad or failure about yourself  - - - 0 0   Trouble concentrating - - - 0 0   Moving slowly or fidgety/restless - - -  0 0   Suicidal thoughts - - - 0 0   PHQ-9 Score - - - 2 0   Difficult doing work/chores - - Not difficult at all Not difficult at all Not difficult at all     Interpretation of Total Score  Total Score Depression Severity:  1-4 = Minimal depression, 5-9 = Mild depression, 10-14 = Moderate depression, 15-19 = Moderately severe depression, 20-27 = Severe depression   Psychosocial Evaluation and Intervention:   Psychosocial Re-Evaluation:    Psychosocial Discharge (Final Psychosocial Re-Evaluation):   Vocational Rehabilitation: Provide vocational rehab assistance to qualifying candidates.   Vocational Rehab Evaluation & Intervention: Vocational Rehab - 08/07/19 1040      Initial Vocational Rehab Evaluation & Intervention   Assessment shows need for Vocational Rehabilitation  No       Education: Education Goals: Education classes will be provided on a weekly basis, covering required topics. Participant will state understanding/return demonstration of topics presented.  Learning Barriers/Preferences: Learning Barriers/Preferences - 08/07/19 1122      Learning Barriers/Preferences   Learning Barriers  None    Learning Preferences  Written Material;Video;Pictoral       Education Topics: Hypertension, Hypertension Reduction -Define heart disease and high blood pressure. Discus how high blood pressure affects the body and ways to reduce high blood pressure.   Exercise and Your Heart -Discuss why it is important to exercise, the FITT principles of exercise, normal and abnormal responses to exercise, and how to exercise safely.   Angina -Discuss definition of angina, causes of angina, treatment of angina, and how to decrease risk of having angina.   Cardiac Medications -Review what the following cardiac medications are used for, how they affect the body, and side effects that may occur when taking the medications.  Medications include Aspirin, Beta blockers, calcium channel blockers, ACE Inhibitors, angiotensin receptor blockers, diuretics, digoxin, and antihyperlipidemics.   Congestive Heart Failure -Discuss the definition of CHF, how to live with CHF, the signs and symptoms of CHF, and how keep track of weight and sodium intake.   Heart Disease and Intimacy -Discus the effect sexual activity has on the heart, how changes occur during intimacy as we age, and safety during sexual activity.   Smoking Cessation  / COPD -Discuss different methods to quit smoking, the health benefits of quitting smoking, and the definition of COPD.   Nutrition I: Fats -Discuss the types of cholesterol, what cholesterol does to the heart, and how cholesterol levels can be controlled.   Nutrition II: Labels -Discuss the different components of food labels and how to read food label   Heart Parts/Heart Disease and PAD -Discuss the anatomy of the heart, the pathway of blood circulation through the heart, and these are affected by heart disease.   Stress I: Signs and Symptoms -Discuss the causes of stress, how stress may lead to anxiety and depression, and ways to limit stress.   Stress II: Relaxation -Discuss different types of relaxation techniques to limit stress.   Warning Signs of Stroke / TIA -Discuss definition of a stroke, what the signs and symptoms are of a stroke, and how to identify when someone is having stroke.   Knowledge Questionnaire Score: Knowledge Questionnaire Score - 08/07/19 1123      Knowledge Questionnaire Score   Pre Score  21/24       Core Components/Risk Factors/Patient Goals at Admission: Personal Goals and Risk Factors at Admission - 08/07/19 1123      Core Components/Risk Factors/Patient Goals  on Admission    Weight Management  Yes;Obesity;Weight Maintenance;Weight Loss    Intervention  Weight Management: Develop a combined nutrition and exercise program designed to reach desired caloric intake, while maintaining appropriate intake of nutrient and fiber, sodium and fats, and appropriate energy expenditure required for the weight goal.;Weight Management: Provide education and appropriate resources to help participant work on and attain dietary goals.;Weight Management/Obesity: Establish reasonable short term and long term weight goals.;Obesity: Provide education and appropriate resources to help participant work on and attain dietary goals.    Admit Weight  202 lb 9.6 oz (91.9  kg)    Expected Outcomes  Short Term: Continue to assess and modify interventions until short term weight is achieved;Long Term: Adherence to nutrition and physical activity/exercise program aimed toward attainment of established weight goal;Weight Maintenance: Understanding of the daily nutrition guidelines, which includes 25-35% calories from fat, 7% or less cal from saturated fats, less than 200mg  cholesterol, less than 1.5gm of sodium, & 5 or more servings of fruits and vegetables daily;Weight Loss: Understanding of general recommendations for a balanced deficit meal plan, which promotes 1-2 lb weight loss per week and includes a negative energy balance of [phone] kcal/d;Understanding recommendations for meals to include 15-35% energy as protein, 25-35% energy from fat, 35-60% energy from carbohydrates, less than 200mg  of dietary cholesterol, 20-35 gm of total fiber daily;Understanding of distribution of calorie intake throughout the day with the consumption of 4-5 meals/snacks    Diabetes  Yes    Intervention  Provide education about signs/symptoms and action to take for hypo/hyperglycemia.;Provide education about proper nutrition, including hydration, and aerobic/resistive exercise prescription along with prescribed medications to achieve blood glucose in normal ranges: Fasting glucose 65-99 mg/dL    Expected Outcomes  Short Term: Participant verbalizes understanding of the signs/symptoms and immediate care of hyper/hypoglycemia, proper foot care and importance of medication, aerobic/resistive exercise and nutrition plan for blood glucose control.;Long Term: Attainment of HbA1C < 7%.    Heart Failure  Yes    Expected Outcomes  Improve functional capacity of life;Short term: Attendance in program 2-3 days a week with increased exercise capacity. Reported lower sodium intake. Reported increased fruit and vegetable intake. Reports medication compliance.;Short term: Daily weights obtained and reported for  increase. Utilizing diuretic protocols set by physician.;Long term: Adoption of self-care skills and reduction of barriers for early signs and symptoms recognition and intervention leading to self-care maintenance.    Hypertension  Yes    Intervention  Provide education on lifestyle modifcations including regular physical activity/exercise, weight management, moderate sodium restriction and increased consumption of fresh fruit, vegetables, and low fat dairy, alcohol moderation, and smoking cessation.;Monitor prescription use compliance.    Expected Outcomes  Short Term: Continued assessment and intervention until BP is < 140/26mm HG in hypertensive participants. < 130/17mm HG in hypertensive participants with diabetes, heart failure or chronic kidney disease.;Long Term: Maintenance of blood pressure at goal levels.    Lipids  Yes    Intervention  Provide education and support for participant on nutrition & aerobic/resistive exercise along with prescribed medications to achieve LDL 70mg , HDL >40mg .    Expected Outcomes  Short Term: Participant states understanding of desired cholesterol values and is compliant with medications prescribed. Participant is following exercise prescription and nutrition guidelines.;Long Term: Cholesterol controlled with medications as prescribed, with individualized exercise RX and with personalized nutrition plan. Value goals: LDL < 70mg , HDL > 40 mg.       Core Components/Risk Factors/Patient Goals Review:  Core Components/Risk Factors/Patient Goals at Discharge (Final Review):    ITP Comments: ITP Comments    Row Name 08/07/19 1047           ITP Comments  Medical Director- Dr. Fransico Him, MD          [place] attended orientation on 08/07/2019 to review rules and guidelines for program.  Completed 6 minute walk test, Intitial ITP, and exercise prescription.  VSS. Telemetry-Sinus Rhythm, first degree heart block rare PVC.  Asymptomatic. Safety  measures and social distancing in place per CDC guidelines. Prissy used her personal rolling walker for stability.Barnet Pall, RN,BSN 08/07/2019 11:59 AM

## 2019-08-08 LAB — GLUCOSE, CAPILLARY: Glucose-Capillary: 249 mg/dL — ABNORMAL HIGH (ref 70–99)

## 2019-08-09 ENCOUNTER — Ambulatory Visit (HOSPITAL_COMMUNITY): Payer: Medicare Other

## 2019-08-13 ENCOUNTER — Encounter (HOSPITAL_COMMUNITY)
Admission: RE | Admit: 2019-08-13 | Discharge: 2019-08-13 | Disposition: A | Discharge status: Home / Self Care | Payer: Medicare Other | Source: Ambulatory Visit | Attending: Cardiology | Admitting: Cardiology

## 2019-08-13 ENCOUNTER — Other Ambulatory Visit: Payer: Self-pay

## 2019-08-13 VITALS — Ht 60.0 in | Wt 202.2 lb

## 2019-08-13 DIAGNOSIS — E119 Type 2 diabetes mellitus without complications: Secondary | ICD-10-CM | POA: Diagnosis not present

## 2019-08-13 DIAGNOSIS — H1045 Other chronic allergic conjunctivitis: Secondary | ICD-10-CM | POA: Diagnosis not present

## 2019-08-13 DIAGNOSIS — H25013 Cortical age-related cataract, bilateral: Secondary | ICD-10-CM | POA: Diagnosis not present

## 2019-08-13 DIAGNOSIS — Z955 Presence of coronary angioplasty implant and graft: Secondary | ICD-10-CM | POA: Diagnosis not present

## 2019-08-13 DIAGNOSIS — H04123 Dry eye syndrome of bilateral lacrimal glands: Secondary | ICD-10-CM | POA: Diagnosis not present

## 2019-08-13 DIAGNOSIS — H31091 Other chorioretinal scars, right eye: Secondary | ICD-10-CM | POA: Diagnosis not present

## 2019-08-13 DIAGNOSIS — H2513 Age-related nuclear cataract, bilateral: Secondary | ICD-10-CM | POA: Diagnosis not present

## 2019-08-13 DIAGNOSIS — I214 Non-ST elevation (NSTEMI) myocardial infarction: Secondary | ICD-10-CM

## 2019-08-13 DIAGNOSIS — H18413 Arcus senilis, bilateral: Secondary | ICD-10-CM | POA: Diagnosis not present

## 2019-08-13 DIAGNOSIS — H35033 Hypertensive retinopathy, bilateral: Secondary | ICD-10-CM | POA: Diagnosis not present

## 2019-08-13 DIAGNOSIS — H40013 Open angle with borderline findings, low risk, bilateral: Secondary | ICD-10-CM | POA: Diagnosis not present

## 2019-08-13 DIAGNOSIS — H35372 Puckering of macula, left eye: Secondary | ICD-10-CM | POA: Diagnosis not present

## 2019-08-13 DIAGNOSIS — H353131 Nonexudative age-related macular degeneration, bilateral, early dry stage: Secondary | ICD-10-CM | POA: Diagnosis not present

## 2019-08-13 DIAGNOSIS — H11153 Pinguecula, bilateral: Secondary | ICD-10-CM | POA: Diagnosis not present

## 2019-08-13 LAB — GLUCOSE, CAPILLARY
Glucose-Capillary: 238 mg/dL — ABNORMAL HIGH (ref 70–99)
Glucose-Capillary: 183 mg/dL — ABNORMAL HIGH (ref 70–99)

## 2019-08-13 MED FILL — RESTASIS 0.05% EYE EMULSION: 0.05 | 90 days supply | Qty: 180 | Fill #0

## 2019-08-13 MED FILL — PAZEO 0.7% EYE DROPS: 0.7 | 25 days supply | Qty: 3 | Fill #0

## 2019-08-13 NOTE — Progress Notes (Signed)
Daily Session Note  Patient Details  Name: [name] MRN: [id number] Date of Birth: Feb 21, 1951 Referring Provider:     CARDIAC REHAB PHASE II ORIENTATION from 08/07/2019 in [place]  Referring Provider  Dr. Percival Spanish      Encounter Date: 08/13/2019  Check In: Session Check In - 08/13/19 1356      Check-In   Supervising physician immediately available to respond to emergencies  [facility] immediately available    Physician(s)  Dr Tawanna Solo    Location  [facility]    Staff Present  Deitra Mayo, BS, ACSM CEP, Exercise Physiologist;Maria Whitaker, RN, Toma Deiters, RN, Mosie Epstein, MS,ACSM CEP, Exercise Physiologist;Mattison Golay Rollene Rotunda, RN, Deland Pretty, MS, ACSM CEP, Exercise Physiologist    Virtual Visit  No    Medication changes reported      No    Fall or balance concerns reported     No    Tobacco Cessation  No Change    Warm-up and Cool-down  Performed on first and last piece of equipment    Resistance Training Performed  No    VAD Patient?  No    PAD/SET Patient?  No      Pain Assessment   Currently in Pain?  Yes    Pain Score  5     Pain Location  Leg    Pain Orientation  Left    Pain Type  Chronic pain    Multiple Pain Sites  No       Capillary Blood Glucose: Results for orders placed or performed during the hospital encounter of 08/13/19 (from the past 24 hour(s))  Glucose, capillary     Status: Abnormal   Collection Time: 08/13/19  1:40 PM  Result Value Ref Range   Glucose-Capillary 238 (H) 70 - 99 mg/dL  Glucose, capillary     Status: Abnormal   Collection Time: 08/13/19  2:22 PM  Result Value Ref Range   Glucose-Capillary 183 (H) 70 - 99 mg/dL   *Note: Due to a large number of results and/or encounters for the requested time period, some results have not been displayed. A complete set of results can be found in Results Review.    Exercise Prescription Changes - 08/13/19 1500      Response to Exercise   Blood Pressure (Admit)  120/68    Blood Pressure (Exercise)  128/70    Blood Pressure (Exit)  122/80    Heart Rate (Admit)  92 bpm    Heart Rate (Exercise)  98 bpm    Heart Rate (Exit)  88 bpm    Rating of Perceived Exertion (Exercise)  15    Perceived Dyspnea (Exercise)  0    Symptoms  5/10 Leg Pain    Comments  Pt oreinted to exercise equipment    Duration  Progress to 30 minutes of  aerobic without signs/symptoms of physical distress    Intensity  THRR unchanged      Progression   Progression  Continue to progress workloads to maintain intensity without signs/symptoms of physical distress.    Average METs  1.5      Resistance Training   Training Prescription  No      NuStep   Level  1    SPM  50    Minutes  20    METs  1.5       Social History   Tobacco Use  Smoking Status Never Smoker  Smokeless Tobacco Never  Used    Goals Met:  Exercise tolerated well No report of cardiac concerns or symptoms  Goals Unmet:  Not Applicable  Comments: Pt started cardiac rehab today.  Pt tolerated light exercise without difficulty. VSS, telemetry-NSR, asymptomatic.  Medication list reconciled. Pt denies barriers to medicaiton compliance.  PSYCHOSOCIAL ASSESSMENT:  PHQ-0. Pt exhibits positive coping skills, hopeful outlook with supportive family. No psychosocial needs identified at this time, no psychosocial interventions necessary. Pt oriented to exercise equipment and routine. Understanding verbalized.   Dr. Fransico Him is Medical Director for Cardiac Rehab at [facility].

## 2019-08-14 ENCOUNTER — Ambulatory Visit (HOSPITAL_COMMUNITY): Payer: Medicare Other

## 2019-08-14 MED FILL — PAZEO 0.7% EYE DROPS: 0.7 | 75 days supply | Qty: 8 | Fill #0

## 2019-08-14 NOTE — Progress Notes (Signed)
[name] 68 y.o. female Nutrition Screening Note  Visit Diagnosis: NSTEMI (non-ST elevated myocardial infarction) ([place]) 06/10/19  Past Medical History:  Diagnosis Date  . Arthritis    "knees, lower back" (08/18/2017)  . 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 S/p lumpectomy and radiation.  Marland Kitchen CAD (coronary artery disease) 11/07   cath 11/11 :  3V CADsee report   . Cataract    small both eyes  . Chronic lower back pain   . Chronic pain    MR 08/07/10 :  Spondylosis L4-5 with B foraminal narrowing and L4 nerve root enchroachment B.  . Chronic venous insufficiency 2012   Has had full W/U incl ECHO, LFT's, Cr, Umicro, TSH, BNP. Symptomatic treatment.  . Colonic polyp 1995   Colonoscopies 1994, 2000, and 02/02/2011. Last had 9 polyps - Tubular adenoma and tubulovillous was the pathology results without high grade dysplasia  . Diabetes mellitus type II    Insulin dependent. Has worked with [name] R previously.  . Diastolic CHF, chronic ([place])    NL EF by echo 01/2012, Gr I dd  . Essential hypertension    Requires 5 drug therapy and still difficult to control.  Marland Kitchen GERD (gastroesophageal reflux disease)   . Heart murmur   . History of radiation therapy 10/02/18- 11/09/18   Left chest wall SCV, Left chest wall boost, Right chest wall SCV, right chest boost all received 28 fractions for a total dose of 50.04 Gy  . Hyperlipidemia    On daily statin  . Iron deficiency anemia    Ferritin was 12 in 2012. on FeSo4. Has had colon and EGD 02/02/11 that showed mild gastritis and colon polyps.  . Obesity    Morbid. HAs worked with [name] T previously.  . OSA (obstructive sleep apnea) 02/16/2007   02/2007 : Moderate AHI 35.6, O2 sat decreased to 65%, CPAP titration to 16 with AHI of 3.6. 02/2014 : Rare respiratory events with sleep disturbance, within normal limits. AHI 0.4 per hour      . OSA on CPAP   . Personal history of chemotherapy   . Personal  history of radiation therapy    "to my left breast"  . Refusal of blood transfusions as patient is Jehovah's Witness   . Seasonal allergies    "grass pollen"     Medications reviewed.   Current Outpatient Medications:  .  ACCU-CHEK FASTCLIX LANCETS MISC, Check blood sugar 3 times a day (Patient taking differently: 1 each by Other route 3 (three) times daily. ), Disp: 102 each, Rfl: 5 .  amLODipine (NORVASC) 10 MG tablet, TAKE ONE (1) TABLET BY MOUTH EVERY DAY (Patient taking differently: Take 10 mg by mouth daily. ), Disp: 90 tablet, Rfl: 3 .  aspirin EC 81 MG tablet, Take 81 mg by mouth daily., Disp: , Rfl:  .  Coenzyme Q10 (CO Q 10) 100 MG CAPS, Take 100 mg by mouth daily. , Disp: , Rfl:  .  diclofenac (FLECTOR) 1.3 % PTCH, Place 1 patch onto the skin 2 (two) times daily., Disp: 30 patch, Rfl: 5 .  furosemide (LASIX) 20 MG tablet, TAKE 1 TABLET (20 MG TOTAL) BY MOUTH AS NEEDED FOR EDEMA. (Patient taking differently: Take 20 mg by mouth daily as needed for edema. ), Disp: 90 tablet, Rfl: 3 .  gabapentin (NEURONTIN) 300 MG capsule, Take 1 capsule (300 mg total) by mouth at bedtime., Disp: 90 capsule, Rfl: 0 .  glucose blood (ACCU-CHEK GUIDE) test strip, Use to check blood sugar 3 times daily. DIAG CODE E11.319. INSULIN DEPENDENT, Disp: 300 each, Rfl: 1 .  insulin aspart (NOVOLOG FLEXPEN) 100 UNIT/ML FlexPen, Inject 6 Units into the skin 3 (three) times daily with meals., Disp: 15 mL, Rfl: 3 .  Insulin Pen Needle 32G X 4 MM MISC, Use to inject insulin 4 times daily under the skin. DIAG CODE E11.319. INSULIN DEPENDENT, Disp: 380 each, Rfl: 1 .  isosorbide mononitrate (IMDUR) 120 MG 24 hr tablet, Take 1 tablet (120 mg total) by mouth 2 (two) times daily., Disp: 60 tablet, Rfl: 6 .  letrozole (FEMARA) 2.5 MG tablet, Take 0.5 tablets (1.25 mg total) by mouth daily., Disp: 30 tablet, Rfl: 0 .  losartan (COZAAR) 50 MG tablet, Take 1 tablet (50 mg total) by mouth daily., Disp: 90 tablet, Rfl: 3 .   metFORMIN (GLUCOPHAGE) 1000 MG tablet, Take 1 tablet (1,000 mg total) by mouth 2 (two) times daily with a meal., Disp: 180 tablet, Rfl: 3 .  metoprolol succinate (TOPROL-XL) 100 MG 24 hr tablet, TAKE ONE (1) TABLET BY MOUTH EVERY DAY (Patient taking differently: Take 100 mg by mouth daily. ), Disp: 90 tablet, Rfl: 3 .  Multiple Vitamins-Minerals (CENTRUM SILVER PO), Take 1 tablet by mouth daily., Disp: , Rfl:  .  nitroGLYCERIN (NITROSTAT) 0.4 MG SL tablet, PLACE ONE TABLET UNDER THE TONGUE EVERY 5 MINUTES AS NEEDED FOR CHEST PAIN (Patient taking differently: Place 0.4 mg under the tongue every 5 (five) minutes as needed for chest pain. ), Disp: 25 tablet, Rfl: 7 .  Olopatadine HCl (PATADAY) 0.2 % SOLN, Place 1 drop into both eyes daily as needed (for allergies). , Disp: , Rfl:  .  omeprazole (PRILOSEC) 40 MG capsule, Take 1 capsule (40 mg total) by mouth daily. (Patient taking differently: Take 40 mg by mouth daily as needed (for acid reflux). ), Disp: 30 capsule, Rfl: 5 .  pyridoxine (B-6) 100 MG tablet, Take 100 mg by mouth daily., Disp: , Rfl:  .  rosuvastatin (CRESTOR) 5 MG tablet, Take 1 tablet (5 mg total) by mouth daily., Disp: 30 tablet, Rfl: 11 .  spironolactone (ALDACTONE) 25 MG tablet, Take 2 tablets (50 mg total) by mouth daily., Disp: 180 tablet, Rfl: 3 .  ticagrelor (BRILINTA) 90 MG TABS tablet, Take 1 tablet (90 mg total) by mouth 2 (two) times daily., Disp: 180 tablet, Rfl: 3 .  TOUJEO SOLOSTAR 300 UNIT/ML SOPN, INJECT 35 UNITS INTO THE SKIN DAILY (Patient taking differently: Inject 35 Units into the skin daily. ), Disp: 9 mL, Rfl: 11 .  vitamin B-12 (CYANOCOBALAMIN) 1000 MCG tablet, Take 1,000 mcg by mouth daily., Disp: , Rfl:    Ht Readings from Last 1 Encounters:  08/07/19 5' (1.524 m)     Wt Readings from Last 3 Encounters:  08/07/19 202 lb 9.6 oz (91.9 kg)  07/20/19 201 lb 4.8 oz (91.3 kg)  07/04/19 202 lb 3.2 oz (91.7 kg)     There is no height or weight on file to  calculate BMI.   Social History   Tobacco Use  Smoking Status Never Smoker  Smokeless Tobacco Never Used     Lab Results  Component Value Date   CHOL 214 (H) 06/11/2019   Lab Results  Component Value Date   HDL 66 06/11/2019   Lab Results  Component Value Date   LDLCALC 141 (H) 06/11/2019   Lab Results  Component Value Date   TRIG  33 06/11/2019   Lab Results  Component Value Date   CHOLHDL 3.2 06/11/2019     Lab Results  Component Value Date   HGBA1C 8.1 (A) 05/31/2019     CBG (last 3)  Recent Labs    08/13/19 1340 08/13/19 1422  GLUCAP 238* 183*      Nutrition Diagnosis ? Food-and nutrition-related knowledge deficit related to lack of exposure to information as related to diagnosis of: ? CVD ? Type 2 Diabetes ? Obese  II = 35-39.9 related to excessive energy intake as evidenced by a 39.7  Nutrition Intervention ? To be determined  Goal(s) ? To be determined  Plan:   Will provide client-centered nutrition education as part of interdisciplinary care  Monitor and evaluate progress toward nutrition goal with team.   Michaele Offer, MS, RDN, LDN

## 2019-08-15 ENCOUNTER — Encounter (HOSPITAL_COMMUNITY): Payer: Medicare Other

## 2019-08-16 ENCOUNTER — Ambulatory Visit (HOSPITAL_COMMUNITY): Payer: Medicare Other

## 2019-08-16 NOTE — Progress Notes (Signed)
Cardiac Individual Treatment Plan  Patient Details  Name: [name] MRN: [id number] Date of Birth: Jul 10, 1951 Referring Provider:     [place] from 08/07/2019 in [place]  Referring Provider  Dr. Percival Spanish      Initial Encounter Date:    CARDIAC REHAB PHASE II ORIENTATION from 08/07/2019 in [place]  Date  08/07/19      Visit Diagnosis: NSTEMI (non-ST elevated myocardial infarction) ([place]) 06/10/19  S/P coronary artery stent placement S/P DES LAD 06/13/19  Patient's Home Medications on Admission:  Current Outpatient Medications:    ACCU-CHEK FASTCLIX LANCETS MISC, Check blood sugar 3 times a day (Patient taking differently: 1 each by Other route 3 (three) times daily. ), Disp: 102 each, Rfl: 5   amLODipine (NORVASC) 10 MG tablet, TAKE ONE (1) TABLET BY MOUTH EVERY DAY (Patient taking differently: Take 10 mg by mouth daily. ), Disp: 90 tablet, Rfl: 3   aspirin EC 81 MG tablet, Take 81 mg by mouth daily., Disp: , Rfl:    Coenzyme Q10 (CO Q 10) 100 MG CAPS, Take 100 mg by mouth daily. , Disp: , Rfl:    diclofenac (FLECTOR) 1.3 % PTCH, Place 1 patch onto the skin 2 (two) times daily., Disp: 30 patch, Rfl: 5   furosemide (LASIX) 20 MG tablet, TAKE 1 TABLET (20 MG TOTAL) BY MOUTH AS NEEDED FOR EDEMA. (Patient taking differently: Take 20 mg by mouth daily as needed for edema. ), Disp: 90 tablet, Rfl: 3   gabapentin (NEURONTIN) 300 MG capsule, Take 1 capsule (300 mg total) by mouth at bedtime., Disp: 90 capsule, Rfl: 0   glucose blood (ACCU-CHEK GUIDE) test strip, Use to check blood sugar 3 times daily. DIAG CODE E11.319. INSULIN DEPENDENT, Disp: 300 each, Rfl: 1   insulin aspart (NOVOLOG FLEXPEN) 100 UNIT/ML FlexPen, Inject 6 Units into the skin 3 (three) times daily with meals., Disp: 15 mL, Rfl: 3   Insulin Pen Needle 32G X 4 MM MISC, Use to inject insulin 4 times daily under the skin.  DIAG CODE E11.319. INSULIN DEPENDENT, Disp: 380 each, Rfl: 1   isosorbide mononitrate (IMDUR) 120 MG 24 hr tablet, Take 1 tablet (120 mg total) by mouth 2 (two) times daily., Disp: 60 tablet, Rfl: 6   letrozole (FEMARA) 2.5 MG tablet, Take 0.5 tablets (1.25 mg total) by mouth daily., Disp: 30 tablet, Rfl: 0   losartan (COZAAR) 50 MG tablet, Take 1 tablet (50 mg total) by mouth daily., Disp: 90 tablet, Rfl: 3   metFORMIN (GLUCOPHAGE) 1000 MG tablet, Take 1 tablet (1,000 mg total) by mouth 2 (two) times daily with a meal., Disp: 180 tablet, Rfl: 3   metoprolol succinate (TOPROL-XL) 100 MG 24 hr tablet, TAKE ONE (1) TABLET BY MOUTH EVERY DAY (Patient taking differently: Take 100 mg by mouth daily. ), Disp: 90 tablet, Rfl: 3   Multiple Vitamins-Minerals (CENTRUM SILVER PO), Take 1 tablet by mouth daily., Disp: , Rfl:    nitroGLYCERIN (NITROSTAT) 0.4 MG SL tablet, PLACE ONE TABLET UNDER THE TONGUE EVERY 5 MINUTES AS NEEDED FOR CHEST PAIN (Patient taking differently: Place 0.4 mg under the tongue every 5 (five) minutes as needed for chest pain. ), Disp: 25 tablet, Rfl: 7   Olopatadine HCl (PATADAY) 0.2 % SOLN, Place 1 drop into both eyes daily as needed (for allergies). , Disp: , Rfl:    omeprazole (PRILOSEC) 40 MG capsule, Take 1 capsule (40 mg  total) by mouth daily. (Patient taking differently: Take 40 mg by mouth daily as needed (for acid reflux). ), Disp: 30 capsule, Rfl: 5   pyridoxine (B-6) 100 MG tablet, Take 100 mg by mouth daily., Disp: , Rfl:    rosuvastatin (CRESTOR) 5 MG tablet, Take 1 tablet (5 mg total) by mouth daily., Disp: 30 tablet, Rfl: 11   spironolactone (ALDACTONE) 25 MG tablet, Take 2 tablets (50 mg total) by mouth daily., Disp: 180 tablet, Rfl: 3   ticagrelor (BRILINTA) 90 MG TABS tablet, Take 1 tablet (90 mg total) by mouth 2 (two) times daily., Disp: 180 tablet, Rfl: 3   TOUJEO SOLOSTAR 300 UNIT/ML SOPN, INJECT 35 UNITS INTO THE SKIN DAILY (Patient taking differently:  Inject 35 Units into the skin daily. 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 S/p lumpectomy and radiation.   CAD (coronary artery disease) 11/07   cath 11/11 :  3V CADsee report    Cataract    small both eyes   Chronic lower back pain    Chronic pain    MR 08/07/10 :  Spondylosis L4-5 with B foraminal narrowing and L4 nerve root enchroachment B.   Chronic venous insufficiency 2012   Has had full W/U incl ECHO, LFT's, Cr, Umicro, TSH, BNP. Symptomatic treatment.   Colonic polyp 1995   Colonoscopies 1994, 2000, and 02/02/2011. Last had 9 polyps - Tubular adenoma and tubulovillous was the pathology results without high grade dysplasia   Diabetes mellitus type II    Insulin dependent. Has worked with [name] R previously.   Diastolic CHF, chronic (HCC)    NL EF by echo 01/2012, Gr I dd   Essential hypertension    Requires 5 drug therapy and still difficult to control.   GERD (gastroesophageal reflux disease)    Heart murmur    History of radiation therapy 10/02/18- 11/09/18   Left chest wall SCV, Left chest wall boost, Right chest wall SCV, right chest boost all received 28 fractions for a total dose of 50.04 Gy   Hyperlipidemia    On daily statin   Iron deficiency anemia    Ferritin was 12 in 2012. on FeSo4. Has had colon and EGD 02/02/11 that showed mild gastritis and colon polyps.   Obesity    Morbid. HAs worked with [name] T previously.   OSA (obstructive sleep apnea) 02/16/2007   02/2007 : Moderate AHI 35.6, O2 sat decreased to 65%, CPAP titration to 16 with AHI of 3.6. 02/2014 : Rare respiratory events with sleep disturbance, within normal limits. AHI 0.4 per hour       OSA on CPAP     Personal history of chemotherapy    Personal history of radiation therapy    "to my left breast"   Refusal of blood transfusions as patient is [employer]'s Witness    Seasonal allergies    "grass pollen"    Tobacco Use: Social History   Tobacco Use  Smoking Status Never Smoker  Smokeless Tobacco Never Used    Labs: Recent Review Flowsheet Data    Labs for ITP Cardiac and Pulmonary Rehab Latest Ref Rng & Units 07/20/2018 07/20/2018 11/30/2018 05/31/2019 06/11/2019   Cholestrol  0 - 200 mg/dL - - - - 214(H)   LDLCALC 0 - 99 mg/dL - - - - 141(H)   LDLDIRECT mg/dL - - - - -   HDL >40 mg/dL - - - - 66   Trlycerides <150 mg/dL - - - - 33   Hemoglobin A1c 4.0 - 5.6 % - - 8.1(A) 8.1(A) -   PHART 7.350 - 7.450 7.360 7.381 - - -   PCO2ART 32.0 - 48.0 mmHg 43.1 38.0 - - -   HCO3 20.0 - 28.0 mmol/L 24.3 22.6 - - -   TCO2 22 - 32 mmol/L 26 24 - - -   ACIDBASEDEF 0.0 - 2.0 mmol/L 1.0 2.0 - - -   O2SAT % 70.0 100.0 - - -      Capillary Blood Glucose: Lab Results  Component Value Date   GLUCAP 183 (H) 08/13/2019   GLUCAP 238 (H) 08/13/2019   GLUCAP 249 (H) 08/07/2019   GLUCAP 141 (H) 06/14/2019   GLUCAP 169 (H) 06/14/2019     Exercise Target Goals: Exercise Program Goal: Individual exercise prescription set using results from initial 6 min walk test and THRR while considering  patients activity barriers and safety.   Exercise Prescription Goal: Starting with aerobic activity 30 plus minutes a day, 3 days per week for initial exercise prescription. Provide home exercise prescription and guidelines that participant acknowledges understanding prior to discharge.  Activity Barriers & Risk Stratification: Activity Barriers & Cardiac Risk Stratification - 08/07/19 1117      Activity Barriers & Cardiac Risk Stratification   Activity Barriers  Arthritis;Back Problems;Muscular Weakness;Deconditioning;Assistive Device;Balance Concerns    Cardiac Risk Stratification  High       6 Minute  Walk: 6 Minute Walk    Row Name 08/07/19 1117         6 Minute Walk   Phase  Initial     Distance  630 feet     Walk Time  6 minutes     # of Rest Breaks  0     MPH  1.1     METS  1.1     RPE  7     Perceived Dyspnea   0     VO2 Peak  3.9     Symptoms  No     Resting HR  76 bpm     Resting BP  114/70     Resting Oxygen Saturation   100 %     Exercise Oxygen Saturation  during 6 min walk  99 %     Max Ex. HR  100 bpm     Max Ex. BP  130/70     2 Minute Post BP  118/70        Oxygen Initial Assessment:   Oxygen Re-Evaluation:   Oxygen Discharge (Final Oxygen Re-Evaluation):   Initial Exercise Prescription: Initial Exercise Prescription - 08/07/19 1100      Date of Initial Exercise RX and Referring Provider   Date  08/07/19    Referring Provider  Dr. Percival Spanish    Expected Discharge Date  10/03/19      NuStep   Level  2    SPM  75    Minutes  30    METs  1.5      Prescription Details   Frequency (times per week)  3    Duration  Progress to 30 minutes of continuous aerobic without signs/symptoms of physical distress      Intensity  THRR 40-80% of Max Heartrate  61-122    Ratings of Perceived Exertion  11-13      Progression   Progression  Continue to progress workloads to maintain intensity without signs/symptoms of physical distress.      Resistance Training   Training Prescription  Yes    Weight  2 lbs.     Reps  10-15       Perform Capillary Blood Glucose checks as needed.  Exercise Prescription Changes: Exercise Prescription Changes    Row Name 08/13/19 1500             Response to Exercise   Blood Pressure (Admit)  120/68       Blood Pressure (Exercise)  128/70       Blood Pressure (Exit)  122/80       Heart Rate (Admit)  92 bpm       Heart Rate (Exercise)  98 bpm       Heart Rate (Exit)  88 bpm       Rating of Perceived Exertion (Exercise)  15       Perceived Dyspnea (Exercise)  0       Symptoms  5/10 Leg Pain       Comments  Pt  oreinted to exercise equipment       Duration  Progress to 30 minutes of  aerobic without signs/symptoms of physical distress       Intensity  THRR unchanged         Progression   Progression  Continue to progress workloads to maintain intensity without signs/symptoms of physical distress.       Average METs  1.5         Resistance Training   Training Prescription  No         NuStep   Level  1       SPM  50       Minutes  20       METs  1.5          Exercise Comments: Exercise Comments    Row Name 08/13/19 1600           Exercise Comments  Pt's first day of exericse. Pt did not tolerate exercise well. Pt reported 5/10 leg pain while exercising on Nustep. Despite pain, pt showed effort. Will continue to monitor pt.          Exercise Goals and Review: Exercise Goals    Row Name 08/07/19 1120             Exercise Goals   Increase Physical Activity  Yes       Intervention  Provide advice, education, support and counseling about physical activity/exercise needs.;Develop an individualized exercise prescription for aerobic and resistive training based on initial evaluation findings, risk stratification, comorbidities and participant's personal goals.       Expected Outcomes  Short Term: Attend rehab on a regular basis to increase amount of physical activity.;Long Term: Add in home exercise to make exercise part of routine and to increase amount of physical activity.;Long Term: Exercising regularly at least 3-5 days a week.       Increase Strength and Stamina  Yes       Intervention  Provide advice, education, support and counseling about physical activity/exercise needs.;Develop an individualized exercise prescription for aerobic and resistive training based on initial evaluation findings, risk stratification, comorbidities and participant's personal goals.       Expected Outcomes  Short Term:  Increase workloads from initial exercise prescription for resistance, speed, and  METs.;Short Term: Perform resistance training exercises routinely during rehab and add in resistance training at home;Long Term: Improve cardiorespiratory fitness, muscular endurance and strength as measured by increased METs and functional capacity (6MWT)       Able to understand and use rate of perceived exertion (RPE) scale  Yes       Intervention  Provide education and explanation on how to use RPE scale       Expected Outcomes  Short Term: Able to use RPE daily in rehab to express subjective intensity level;Long Term:  Able to use RPE to guide intensity level when exercising independently       Knowledge and understanding of Target Heart Rate Range (THRR)  Yes       Intervention  Provide education and explanation of THRR including how the numbers were predicted and where they are located for reference       Expected Outcomes  Short Term: Able to state/look up THRR;Long Term: Able to use THRR to govern intensity when exercising independently;Short Term: Able to use daily as guideline for intensity in rehab       Able to check pulse independently  Yes       Intervention  Provide education and demonstration on how to check pulse in carotid and radial arteries.;Review the importance of being able to check your own pulse for safety during independent exercise       Expected Outcomes  Short Term: Able to explain why pulse checking is important during independent exercise;Long Term: Able to check pulse independently and accurately       Understanding of Exercise Prescription  Yes       Intervention  Provide education, explanation, and written materials on patient's individual exercise prescription       Expected Outcomes  Short Term: Able to explain program exercise prescription;Long Term: Able to explain home exercise prescription to exercise independently          Exercise Goals Re-Evaluation : Exercise Goals Re-Evaluation    Row Name 08/13/19 1602             Exercise Goal Re-Evaluation    Exercise Goals Review  Increase Physical Activity;Increase Strength and Stamina       Comments  Pt struggled with exercise due to leg pain and deconditioning. Pt maintained good spirits and is motivated to exercise. Will work with pt to increase exercise tolearance and functional capacity.       Expected Outcomes  Pt will continue to build stamina in order to exercise for 30 minutes. Will progress workloads as tolerated by pt.           Discharge Exercise Prescription (Final Exercise Prescription Changes): Exercise Prescription Changes - 08/13/19 1500      Response to Exercise   Blood Pressure (Admit)  120/68    Blood Pressure (Exercise)  128/70    Blood Pressure (Exit)  122/80    Heart Rate (Admit)  92 bpm    Heart Rate (Exercise)  98 bpm    Heart Rate (Exit)  88 bpm    Rating of Perceived Exertion (Exercise)  15    Perceived Dyspnea (Exercise)  0    Symptoms  5/10 Leg Pain    Comments  Pt oreinted to exercise equipment    Duration  Progress to 30 minutes of  aerobic without signs/symptoms of physical distress    Intensity  THRR unchanged  Progression   Progression  Continue to progress workloads to maintain intensity without signs/symptoms of physical distress.    Average METs  1.5      Resistance Training   Training Prescription  No      NuStep   Level  1    SPM  50    Minutes  20    METs  1.5       Nutrition:  Target Goals: Understanding of nutrition guidelines, daily intake of sodium 1500mg , cholesterol 200mg , calories 30% from fat and 7% or less from saturated fats, daily to have 5 or more servings of fruits and vegetables.  Biometrics: Pre Biometrics - 08/07/19 1121      Pre Biometrics   Height  5' (1.524 m)    Weight  91.9 kg    Waist Circumference  42 inches    Hip Circumference  50.5 inches    Waist to Hip Ratio  0.83 %    BMI (Calculated)  39.57    Triceps Skinfold  70 mm    % Body Fat  54 %    Grip Strength  27 kg    Flexibility  0 in     Single Leg Stand  0 seconds        Nutrition Therapy Plan and Nutrition Goals: Nutrition Therapy & Goals - 08/14/19 0827      Nutrition Therapy   Diet  Heart Healthy/Carbohydrate modified    Drug/Food Interactions  Statins/Certain Fruits      Personal Nutrition Goals   Nutrition Goal  Improved glycemic control as evidenced by pt's A1c trending from 8.8 to 7 or less.      Intervention Plan   Intervention  Prescribe, educate and counsel regarding individualized specific dietary modifications aiming towards targeted core components such as weight, hypertension, lipid management, diabetes, heart failure and other comorbidities.;Nutrition handout(s) given to patient.    Expected Outcomes  Short Term Goal: A plan has been developed with personal nutrition goals set during dietitian appointment.;Long Term Goal: Adherence to prescribed nutrition plan.       Nutrition Assessments: Nutrition Assessments - 08/14/19 0827      MEDFICTS Scores   Pre Score  24       Nutrition Goals Re-Evaluation:   Nutrition Goals Discharge (Final Nutrition Goals Re-Evaluation):   Psychosocial: Target Goals: Acknowledge presence or absence of significant depression and/or stress, maximize coping skills, provide positive support system. Participant is able to verbalize types and ability to use techniques and skills needed for reducing stress and depression.  Initial Review & Psychosocial Screening: Initial Psych Review & Screening - 08/07/19 1032      Initial Review   Current issues with  None Identified      Family Dynamics   Good Support System?  Yes   Larrie has her two daughters and grandchildren for support     Barriers   Psychosocial barriers to participate in program  There are no identifiable barriers or psychosocial needs.      Screening Interventions   Interventions  Encouraged to exercise       Quality of Life Scores: Quality of Life - 08/07/19 1121      Quality of Life    Select  Quality of Life      Quality of Life Scores   Health/Function Pre  27.89 %    Socioeconomic Pre  27.25 %    Psych/Spiritual Pre  27.5 %    Family Pre  24.5 %  GLOBAL Pre  27.15 %      Scores of 19 and below usually indicate a poorer quality of life in these areas.  A difference of  2-3 points is a clinically meaningful difference.  A difference of 2-3 points in the total score of the Quality of Life Index has been associated with significant improvement in overall quality of life, self-image, physical symptoms, and general health in studies assessing change in quality of life.  PHQ-9: Recent Review Flowsheet Data    Depression screen [facility] 2/9 08/07/2019 06/21/2019 06/05/2019 05/31/2019 11/30/2018   Decreased Interest 0 0 0 0 0    Down, Depressed, Hopeless 0 0 0 1 0   PHQ - 2 Score 0 0 0 1 0   Altered sleeping - - - 1 0   Tired, decreased energy - - - 0 0   Change in appetite - - - 0 0   Feeling bad or failure about yourself  - - - 0 0   Trouble concentrating - - - 0 0   Moving slowly or fidgety/restless - - - 0 0   Suicidal thoughts - - - 0 0   PHQ-9 Score - - - 2 0   Difficult doing work/chores - - Not difficult at all Not difficult at all Not difficult at all     Interpretation of Total Score  Total Score Depression Severity:  1-4 = Minimal depression, 5-9 = Mild depression, 10-14 = Moderate depression, 15-19 = Moderately severe depression, 20-27 = Severe depression   Psychosocial Evaluation and Intervention: Psychosocial Evaluation - 08/13/19 1644      Psychosocial Evaluation & Interventions   Interventions  Encouraged to exercise with the program and follow exercise prescription    Comments  Patient denies psychosocial barriers to participation in CR at this time. She maintains a positive attitude and admits to having a strong support system. She enjoys crafting including crocheting and making jewlry. She has a strong faith and participates in [employer] services.      Expected Outcomes  Patient will continue to have a positive attitude and outlook. She will rely on her faith and support system for encouratement and support if psychosocial barriers arise.    Continue Psychosocial Services   No Follow up required       Psychosocial Re-Evaluation:   Psychosocial Discharge (Final Psychosocial Re-Evaluation):   Vocational Rehabilitation: Provide vocational rehab assistance to qualifying candidates.   Vocational Rehab Evaluation & Intervention: Vocational Rehab - 08/07/19 1040      Initial Vocational Rehab Evaluation & Intervention   Assessment shows need for Vocational Rehabilitation  No       Education: Education Goals: Education classes will be provided on a weekly basis, covering required topics. Participant will state understanding/return demonstration of topics presented.  Learning Barriers/Preferences: Learning Barriers/Preferences - 08/07/19 1122      Learning Barriers/Preferences   Learning Barriers  None    Learning Preferences  Written Material;Video;Pictoral       Education Topics: Hypertension, Hypertension Reduction -Define heart disease and high blood pressure. Discus how high blood pressure affects the body and ways to reduce high blood pressure.   Exercise and Your Heart -Discuss why it is important to exercise, the FITT principles of exercise, normal and abnormal responses to exercise, and how to exercise safely.   Angina -Discuss definition of angina, causes of angina, treatment of angina, and how to decrease risk of having angina.   Cardiac Medications -Review what the following cardiac  medications are used for, how they affect the body, and side effects that may occur when taking the medications.  Medications include Aspirin, Beta blockers, calcium channel blockers, ACE Inhibitors, angiotensin receptor blockers, diuretics, digoxin, and antihyperlipidemics.   Congestive Heart Failure -Discuss the definition of  CHF, how to live with CHF, the signs and symptoms of CHF, and how keep track of weight and sodium intake.   Heart Disease and Intimacy -Discus the effect sexual activity has on the heart, how changes occur during intimacy as we age, and safety during sexual activity.   Smoking Cessation / COPD -Discuss different methods to quit smoking, the health benefits of quitting smoking, and the definition of COPD.   Nutrition I: Fats -Discuss the types of cholesterol, what cholesterol does to the heart, and how cholesterol levels can be controlled.   Nutrition II: Labels -Discuss the different components of food labels and how to read food label   Heart Parts/Heart Disease and PAD -Discuss the anatomy of the heart, the pathway of blood circulation through the heart, and these are affected by heart disease.   Stress I: Signs and Symptoms -Discuss the causes of stress, how stress may lead to anxiety and depression, and ways to limit stress.   Stress II: Relaxation -Discuss different types of relaxation techniques to limit stress.   Warning Signs of Stroke / TIA -Discuss definition of a stroke, what the signs and symptoms are of a stroke, and how to identify when someone is having stroke.   Knowledge Questionnaire Score: Knowledge Questionnaire Score - 08/07/19 1123      Knowledge Questionnaire Score   Pre Score  21/24       Core Components/Risk Factors/Patient Goals at Admission: Personal Goals and Risk Factors at Admission - 08/07/19 1123      Core Components/Risk Factors/Patient Goals on Admission    Weight Management  Yes;Obesity;Weight Maintenance;Weight Loss    Intervention  Weight Management: Develop a combined nutrition and exercise program designed to reach desired caloric intake, while maintaining appropriate intake of nutrient and fiber, sodium and fats, and appropriate energy expenditure required for the weight goal.;Weight Management: Provide education and appropriate  resources to help participant work on and attain dietary goals.;Weight Management/Obesity: Establish reasonable short term and long term weight goals.;Obesity: Provide education and appropriate resources to help participant work on and attain dietary goals.    Admit Weight  202 lb 9.6 oz (91.9 kg)    Expected Outcomes  Short Term: Continue to assess and modify interventions until short term weight is achieved;Long Term: Adherence to nutrition and physical activity/exercise program aimed toward attainment of established weight goal;Weight Maintenance: Understanding of the daily nutrition guidelines, which includes 25-35% calories from fat, 7% or less cal from saturated fats, less than 200mg  cholesterol, less than 1.5gm of sodium, & 5 or more servings of fruits and vegetables daily;Weight Loss: Understanding of general recommendations for a balanced deficit meal plan, which promotes 1-2 lb weight loss per week and includes a negative energy balance of [phone] kcal/d;Understanding recommendations for meals to include 15-35% energy as protein, 25-35% energy from fat, 35-60% energy from carbohydrates, less than 200mg  of dietary cholesterol, 20-35 gm of total fiber daily;Understanding of distribution of calorie intake throughout the day with the consumption of 4-5 meals/snacks    Diabetes  Yes    Intervention  Provide education about signs/symptoms and action to take for hypo/hyperglycemia.;Provide education about proper nutrition, including hydration, and aerobic/resistive exercise prescription along with prescribed medications to achieve blood  glucose in normal ranges: Fasting glucose 65-99 mg/dL    Expected Outcomes  Short Term: Participant verbalizes understanding of the signs/symptoms and immediate care of hyper/hypoglycemia, proper foot care and importance of medication, aerobic/resistive exercise and nutrition plan for blood glucose control.;Long Term: Attainment of HbA1C < 7%.    Heart Failure  Yes     Expected Outcomes  Improve functional capacity of life;Short term: Attendance in program 2-3 days a week with increased exercise capacity. Reported lower sodium intake. Reported increased fruit and vegetable intake. Reports medication compliance.;Short term: Daily weights obtained and reported for increase. Utilizing diuretic protocols set by physician.;Long term: Adoption of self-care skills and reduction of barriers for early signs and symptoms recognition and intervention leading to self-care maintenance.    Hypertension  Yes    Intervention  Provide education on lifestyle modifcations including regular physical activity/exercise, weight management, moderate sodium restriction and increased consumption of fresh fruit, vegetables, and low fat dairy, alcohol moderation, and smoking cessation.;Monitor prescription use compliance.    Expected Outcomes  Short Term: Continued assessment and intervention until BP is < 140/67mm HG in hypertensive participants. < 130/37mm HG in hypertensive participants with diabetes, heart failure or chronic kidney disease.;Long Term: Maintenance of blood pressure at goal levels.    Lipids  Yes    Intervention  Provide education and support for participant on nutrition & aerobic/resistive exercise along with prescribed medications to achieve LDL 70mg , HDL >40mg .    Expected Outcomes  Short Term: Participant states understanding of desired cholesterol values and is compliant with medications prescribed. Participant is following exercise prescription and nutrition guidelines.;Long Term: Cholesterol controlled with medications as prescribed, with individualized exercise RX and with personalized nutrition plan. Value goals: LDL < 70mg , HDL > 40 mg.       Core Components/Risk Factors/Patient Goals Review:  Goals and Risk Factor Review    Row Name 08/13/19 1651             Core Components/Risk Factors/Patient Goals Review   Personal Goals Review  Weight  Management/Obesity;Lipids;Diabetes;Heart Failure;Hypertension       Review  Patient with multiple CAD risk factors. She is eager to participate in CR and learn lifestyle modifications.       Expected Outcomes  Patient will participate in exercise, nutrition and lifestyle modification education to decrease overall RF. Patient's personal goal is to be able to walk without her Rollator and to feel more comfortable walking with a cane.          Core Components/Risk Factors/Patient Goals at Discharge (Final Review):  Goals and Risk Factor Review - 08/13/19 1651      Core Components/Risk Factors/Patient Goals Review   Personal Goals Review  Weight Management/Obesity;Lipids;Diabetes;Heart Failure;Hypertension    Review  Patient with multiple CAD risk factors. She is eager to participate in CR and learn lifestyle modifications.    Expected Outcomes  Patient will participate in exercise, nutrition and lifestyle modification education to decrease overall RF. Patient's personal goal is to be able to walk without her Rollator and to feel more comfortable walking with a cane.       ITP Comments: ITP Comments    Row Name 08/07/19 1047 08/13/19 1640         ITP Comments  Medical Director- Dr. Fransico Him, MD  30 day ITP review: Patient completed her first exercise session today in CR and tolerated well. 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         Comments: see ITP comment

## 2019-08-17 ENCOUNTER — Encounter (HOSPITAL_COMMUNITY)
Admission: RE | Admit: 2019-08-17 | Discharge: 2019-08-17 | Disposition: A | Discharge status: Home / Self Care | Payer: Medicare Other | Source: Ambulatory Visit | Attending: Cardiology | Admitting: Cardiology

## 2019-08-17 ENCOUNTER — Other Ambulatory Visit: Payer: Self-pay

## 2019-08-17 DIAGNOSIS — Z955 Presence of coronary angioplasty implant and graft: Secondary | ICD-10-CM

## 2019-08-17 DIAGNOSIS — I214 Non-ST elevation (NSTEMI) myocardial infarction: Secondary | ICD-10-CM | POA: Diagnosis not present

## 2019-08-17 LAB — GLUCOSE, CAPILLARY
Glucose-Capillary: 106 mg/dL — ABNORMAL HIGH (ref 70–99)
Glucose-Capillary: 68 mg/dL — ABNORMAL LOW (ref 70–99)

## 2019-08-17 MED FILL — AMLODIPINE BESYLATE 10 MG T: 10 | 90 days supply | Qty: 90 | Fill #1

## 2019-08-17 MED FILL — METOPROLOL SUCCINATE ER 100: 100 | 90 days supply | Qty: 90 | Fill #1

## 2019-08-17 NOTE — Progress Notes (Signed)
Discharge Progress Report  Patient Details  Name: [name] MRN: [id number] Date of Birth: 04/19/1951 Referring Provider:     [facility] from 08/07/2019 in [facility]  Referring Provider  Dr. Percival Spanish       Number of Visits: 2  Reason for Discharge:  Early Exit:  severe leg pain with exercise  Smoking History:  Social History   Tobacco Use  Smoking Status Never Smoker  Smokeless Tobacco Never Used    Diagnosis:  NSTEMI (non-ST elevated myocardial infarction) ([facility]) 06/10/19  S/P coronary artery stent placement S/P DES LAD 06/13/19  ADL [facility]:   Initial Exercise Prescription: Initial Exercise Prescription - 08/07/19 1100      Date of Initial Exercise RX and Referring Provider   Date  08/07/19    Referring Provider  Dr. Percival Spanish    Expected Discharge Date  10/03/19      NuStep   Level  2    SPM  75    Minutes  30    METs  1.5      Prescription Details   Frequency (times per week)  3    Duration  Progress to 30 minutes of continuous aerobic without signs/symptoms of physical distress      Intensity   THRR 40-80% of Max Heartrate  61-122    Ratings of Perceived Exertion  11-13      Progression   Progression  Continue to progress workloads to maintain intensity without signs/symptoms of physical distress.      Resistance Training   Training Prescription  Yes    Weight  2 lbs.     Reps  10-15       Discharge Exercise Prescription (Final Exercise Prescription Changes): Exercise Prescription Changes - 08/17/19 1500      Response to Exercise   Blood Pressure (Admit)  130/56    Blood Pressure (Exercise)  126/68    Blood Pressure (Exit)  131/56    Heart Rate (Admit)  102 bpm    Heart Rate (Exercise)  115 bpm    Heart Rate (Exit)  95 bpm    Rating of Perceived Exertion (Exercise)  15    Perceived Dyspnea (Exercise)  0    Symptoms  5/10 Leg Pain    Comments  Pt discharged from CR program.      Duration  Progress to 30 minutes of  aerobic without signs/symptoms of physical distress    Intensity  THRR unchanged      Progression   Progression  Continue to progress workloads to maintain intensity without signs/symptoms of physical distress.    Average METs  1.7      Resistance Training   Training Prescription  No      NuStep   Level  1    SPM  50    Minutes  20    METs  1.7       Functional Capacity: 6 Minute Walk    Row Name 08/07/19 1117         6 Minute Walk   Phase  Initial     Distance  630 feet     Walk Time  6 minutes     # of Rest Breaks  0     MPH  1.1     METS  1.1     RPE  7     Perceived Dyspnea   0     VO2 Peak  3.9  Symptoms  No     Resting HR  76 bpm     Resting BP  114/70     Resting Oxygen Saturation   100 %     Exercise Oxygen Saturation  during 6 min walk  99 %     Max Ex. HR  100 bpm     Max Ex. BP  130/70     2 Minute Post BP  118/70        Psychological, QOL, Others - Outcomes: PHQ 2/9: Depression screen [facility] 2/9 08/07/2019 06/21/2019 06/05/2019 05/31/2019 11/30/2018  Decreased Interest 0 0 0 0 0  Down, Depressed, Hopeless 0 0 0 1 0  PHQ - 2 Score 0 0 0 1 0  Altered sleeping - - - 1 0  Tired, decreased energy - - - 0 0  Change in appetite - - - 0 0  Feeling bad or failure about yourself  - - - 0 0  Trouble concentrating - - - 0 0  Moving slowly or fidgety/restless - - - 0 0  Suicidal thoughts - - - 0 0  PHQ-9 Score - - - 2 0  Difficult doing work/chores - - Not difficult at all Not difficult at all Not difficult at all  Some recent data might be hidden    Quality of Life: Quality of Life - 08/07/19 1121      Quality of Life   Select  Quality of Life      Quality of Life Scores   Health/Function Pre  27.89 %    Socioeconomic Pre  27.25 %    Psych/Spiritual Pre  27.5 %    Family Pre  24.5 %    GLOBAL Pre  27.15 %       Personal Goals: Goals established at orientation with interventions provided to work toward  goal. Personal Goals and Risk Factors at Admission - 08/07/19 1123      Core Components/Risk Factors/Patient Goals on Admission    Weight Management  Yes;Obesity;Weight Maintenance;Weight Loss    Intervention  Weight Management: Develop a combined nutrition and exercise program designed to reach desired caloric intake, while maintaining appropriate intake of nutrient and fiber, sodium and fats, and appropriate energy expenditure required for the weight goal.;Weight Management: Provide education and appropriate resources to help participant work on and attain dietary goals.;Weight Management/Obesity: Establish reasonable short term and long term weight goals.;Obesity: Provide education and appropriate resources to help participant work on and attain dietary goals.    Admit Weight  202 lb 9.6 oz (91.9 kg)    Expected Outcomes  Short Term: Continue to assess and modify interventions until short term weight is achieved;Long Term: Adherence to nutrition and physical activity/exercise program aimed toward attainment of established weight goal;Weight Maintenance: Understanding of the daily nutrition guidelines, which includes 25-35% calories from fat, 7% or less cal from saturated fats, less than 200mg  cholesterol, less than 1.5gm of sodium, & 5 or more servings of fruits and vegetables daily;Weight Loss: Understanding of general recommendations for a balanced deficit meal plan, which promotes 1-2 lb weight loss per week and includes a negative energy balance of [phone] kcal/d;Understanding recommendations for meals to include 15-35% energy as protein, 25-35% energy from fat, 35-60% energy from carbohydrates, less than 200mg  of dietary cholesterol, 20-35 gm of total fiber daily;Understanding of distribution of calorie intake throughout the day with the consumption of 4-5 meals/snacks    Diabetes  Yes    Intervention  Provide education about signs/symptoms and  action to take for hypo/hyperglycemia.;Provide  education about proper nutrition, including hydration, and aerobic/resistive exercise prescription along with prescribed medications to achieve blood glucose in normal ranges: Fasting glucose 65-99 mg/dL    Expected Outcomes  Short Term: Participant verbalizes understanding of the signs/symptoms and immediate care of hyper/hypoglycemia, proper foot care and importance of medication, aerobic/resistive exercise and nutrition plan for blood glucose control.;Long Term: Attainment of HbA1C < 7%.    Heart Failure  Yes    Expected Outcomes  Improve functional capacity of life;Short term: Attendance in program 2-3 days a week with increased exercise capacity. Reported lower sodium intake. Reported increased fruit and vegetable intake. Reports medication compliance.;Short term: Daily weights obtained and reported for increase. Utilizing diuretic protocols set by physician.;Long term: Adoption of self-care skills and reduction of barriers for early signs and symptoms recognition and intervention leading to self-care maintenance.    Hypertension  Yes    Intervention  Provide education on lifestyle modifcations including regular physical activity/exercise, weight management, moderate sodium restriction and increased consumption of fresh fruit, vegetables, and low fat dairy, alcohol moderation, and smoking cessation.;Monitor prescription use compliance.    Expected Outcomes  Short Term: Continued assessment and intervention until BP is < 140/17mm HG in hypertensive participants. < 130/88mm HG in hypertensive participants with diabetes, heart failure or chronic kidney disease.;Long Term: Maintenance of blood pressure at goal levels.    Lipids  Yes    Intervention  Provide education and support for participant on nutrition & aerobic/resistive exercise along with prescribed medications to achieve LDL 70mg , HDL >40mg .    Expected Outcomes  Short Term: Participant states understanding of desired cholesterol values and is  compliant with medications prescribed. Participant is following exercise prescription and nutrition guidelines.;Long Term: Cholesterol controlled with medications as prescribed, with individualized exercise RX and with personalized nutrition plan. Value goals: LDL < 70mg , HDL > 40 mg.        Personal Goals Discharge: Goals and Risk Factor Review    Row Name 08/13/19 1651             Core Components/Risk Factors/Patient Goals Review   Personal Goals Review  Weight Management/Obesity;Lipids;Diabetes;Heart Failure;Hypertension       Review  Patient with multiple CAD risk factors. She is eager to participate in CR and learn lifestyle modifications.       Expected Outcomes  Patient will participate in exercise, nutrition and lifestyle modification education to decrease overall RF. Patient's personal goal is to be able to walk without her Rollator and to feel more comfortable walking with a cane.          Exercise Goals and Review: Exercise Goals    Row Name 08/07/19 1120             Exercise Goals   Increase Physical Activity  Yes       Intervention  Provide advice, education, support and counseling about physical activity/exercise needs.;Develop an individualized exercise prescription for aerobic and resistive training based on initial evaluation findings, risk stratification, comorbidities and participant's personal goals.       Expected Outcomes  Short Term: Attend rehab on a regular basis to increase amount of physical activity.;Long Term: Add in home exercise to make exercise part of routine and to increase amount of physical activity.;Long Term: Exercising regularly at least 3-5 days a week.       Increase Strength and Stamina  Yes       Intervention  Provide advice, education, support and counseling about  physical activity/exercise needs.;Develop an individualized exercise prescription for aerobic and resistive training based on initial evaluation findings, risk stratification,  comorbidities and participant's personal goals.       Expected Outcomes  Short Term: Increase workloads from initial exercise prescription for resistance, speed, and METs.;Short Term: Perform resistance training exercises routinely during rehab and add in resistance training at home;Long Term: Improve cardiorespiratory fitness, muscular endurance and strength as measured by increased METs and functional capacity (6MWT)       Able to understand and use rate of perceived exertion (RPE) scale  Yes       Intervention  Provide education and explanation on how to use RPE scale       Expected Outcomes  Short Term: Able to use RPE daily in rehab to express subjective intensity level;Long Term:  Able to use RPE to guide intensity level when exercising independently       Knowledge and understanding of Target Heart Rate Range (THRR)  Yes       Intervention  Provide education and explanation of THRR including how the numbers were predicted and where they are located for reference       Expected Outcomes  Short Term: Able to state/look up THRR;Long Term: Able to use THRR to govern intensity when exercising independently;Short Term: Able to use daily as guideline for intensity in rehab       Able to check pulse independently  Yes       Intervention  Provide education and demonstration on how to check pulse in carotid and radial arteries.;Review the importance of being able to check your own pulse for safety during independent exercise       Expected Outcomes  Short Term: Able to explain why pulse checking is important during independent exercise;Long Term: Able to check pulse independently and accurately       Understanding of Exercise Prescription  Yes       Intervention  Provide education, explanation, and written materials on patient's individual exercise prescription       Expected Outcomes  Short Term: Able to explain program exercise prescription;Long Term: Able to explain home exercise prescription to  exercise independently          Exercise Goals Re-Evaluation: Exercise Goals Re-Evaluation    Row Name 08/13/19 1602 08/17/19 1532           Exercise Goal Re-Evaluation   Exercise Goals Review  Increase Physical Activity;Increase Strength and Stamina  -      Comments  Pt struggled with exercise due to leg pain and deconditioning. Pt maintained good spirits and is motivated to exercise. Will work with pt to increase exercise tolearance and functional capacity.  Pt discharged from CR program today. Pt was unable to tolerated the seated stepper at level 1 without severe leg pain.      Expected Outcomes  Pt will continue to build stamina in order to exercise for 30 minutes. Will progress workloads as tolerated by pt.  -         Nutrition & Weight - Outcomes: Pre Biometrics - 08/07/19 1121      Pre Biometrics   Height  5' (1.524 m)    Weight  91.9 kg    Waist Circumference  42 inches    Hip Circumference  50.5 inches    Waist to Hip Ratio  0.83 %    BMI (Calculated)  39.57    Triceps Skinfold  70 mm    % Body Fat  54 %    Grip Strength  27 kg    Flexibility  0 in    Single Leg Stand  0 seconds        Nutrition: Nutrition Therapy & Goals - 08/14/19 0827      Nutrition Therapy   Diet  Heart Healthy/Carbohydrate modified    Drug/Food Interactions  Statins/Certain Fruits      Personal Nutrition Goals   Nutrition Goal  Improved glycemic control as evidenced by pt's A1c trending from 8.8 to 7 or less.      Intervention Plan   Intervention  Prescribe, educate and counsel regarding individualized specific dietary modifications aiming towards targeted core components such as weight, hypertension, lipid management, diabetes, heart failure and other comorbidities.;Nutrition handout(s) given to patient.    Expected Outcomes  Short Term Goal: A plan has been developed with personal nutrition goals set during dietitian appointment.;Long Term Goal: Adherence to prescribed nutrition plan.        Nutrition Discharge: Nutrition Assessments - 08/14/19 0827      MEDFICTS Scores   Pre Score  24       Education Questionnaire Score: Knowledge Questionnaire Score - 08/07/19 1123      Knowledge Questionnaire Score   Pre Score  21/24      Ms. Ealy has a history of chronic pain. She was referred to CR after NSTEMI with stent placement. 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 Ms. Mackins states she was unable to attend CR on Wednesday secondary to the pain caused by exercise on Monday. She presented again today, however after her exercise, she had to be assisted x2 with ambulation using her Rollator. She feels she is unable to participate in CR at this time. She request discharge. This RN CM will discuss patients inability to participate in exercise with PCP. Patient discharged per her request.

## 2019-08-20 ENCOUNTER — Encounter (HOSPITAL_COMMUNITY): Payer: Medicare Other

## 2019-08-21 ENCOUNTER — Telehealth: Payer: Self-pay | Admitting: *Deleted

## 2019-08-21 ENCOUNTER — Ambulatory Visit (HOSPITAL_COMMUNITY): Payer: Medicare Other

## 2019-08-21 LAB — GLUCOSE, CAPILLARY: Glucose-Capillary: 94 mg/dL (ref 70–99)

## 2019-08-21 NOTE — Telephone Encounter (Signed)
PATIENT CALLED REQUESTING A NEUROLOGY REFERRAL TO DR JAFFE / [phone].  MESSAGE SENT TO DR Software engineer.

## 2019-08-22 ENCOUNTER — Encounter (HOSPITAL_COMMUNITY): Payer: Medicare Other

## 2019-08-23 ENCOUNTER — Ambulatory Visit (HOSPITAL_COMMUNITY): Payer: Medicare Other

## 2019-08-24 ENCOUNTER — Encounter (HOSPITAL_COMMUNITY): Payer: Medicare Other

## 2019-08-27 ENCOUNTER — Telehealth: Payer: Self-pay | Admitting: Internal Medicine

## 2019-08-27 ENCOUNTER — Other Ambulatory Visit: Payer: Self-pay | Admitting: Internal Medicine

## 2019-08-27 ENCOUNTER — Encounter (HOSPITAL_COMMUNITY): Payer: Medicare Other

## 2019-08-27 DIAGNOSIS — M5416 Radiculopathy, lumbar region: Secondary | ICD-10-CM

## 2019-08-27 MED FILL — LOSARTAN POTASSIUM 50 MG TA: 50 | 30 days supply | Qty: 30 | Fill #2

## 2019-08-27 NOTE — Telephone Encounter (Signed)
I called the patient and verified the details I documented in August.  She is willing to get an MRI (cancelled when she got admitted) and I have put in that order.  I also referred her to neurology but told her that that may change based on the MRI findings.  I will call her once I get the MRI results.

## 2019-08-27 NOTE — Telephone Encounter (Addendum)
Pt calling back about referral appointment to a Neurologist. Pt states she called in on 08/21/2019 requesting this referral. Pt also asking about rescheduling another MRI she cancelled due to being Hospitalized Please advise .

## 2019-08-28 ENCOUNTER — Ambulatory Visit (HOSPITAL_COMMUNITY): Payer: Medicare Other

## 2019-08-28 NOTE — Telephone Encounter (Signed)
Follow Up.  Patient has been sch for her MRI on 09/07/2019 with [facility] radiology.   Patient has been notified of her appointment. Also, the patient's referral request  has been sent to [facility].  Per their notes they have already called the patient to schedule her but, she has declined an appointment until after her MRI, per Referral Coordinator.

## 2019-08-29 ENCOUNTER — Encounter (HOSPITAL_COMMUNITY): Payer: Medicare Other

## 2019-08-29 ENCOUNTER — Telehealth: Payer: Self-pay | Admitting: Hematology and Oncology

## 2019-08-29 NOTE — Telephone Encounter (Signed)
Confirmed telephone visit for 11/27 and verified pt's demographics

## 2019-08-30 NOTE — Progress Notes (Signed)
HEMATOLOGY-ONCOLOGY TELEPHONE VISIT PROGRESS NOTE  I connected with [name] on 08/31/2019 at  8:45 AM EST by telephone and verified that I am speaking with the correct person using two identifiers.  I discussed the limitations, risks, security and privacy concerns of performing an evaluation and management service by telephone and the availability of in person appointments.  I also discussed with the patient that there may be a patient responsible charge related to this service. The patient expressed understanding and agreed to proceed.   History of Present Illness: [name] is a 68 y.o. female with above-mentioned history of left breast cancer in 2006 with recurrence in the left breast and axilla who underwent neoadjuvant chemotherapy with Taxotere and Cytoxan followed by bilateral mastectomies and adjuvant radiation. She is currently on antiestrogen therapy with letrozole after she could not tolerate anastrozole. .She is over the phone today for follow-up.   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 S/p lumpectomy and radiation.   03/21/2018 Initial Diagnosis   [place] skin anterior left breast: Ultrasound 7 o'clock position 2 cm from nipple irregular hypoechoic mass 2.2 x 2.5 x 2.5 cm (1 cm medial to prior lumpectomy); 12:00: Hypoechoic mass extending to skin 3.9 x 2.9 x 3.6 cm market skin thickening, no axillary lymph nodes   03/21/2018 Initial Biopsy   Left breast 12 o'clock position: IDC grade 3; 7 o'clock position: IDC grade 3, ER 40%, PR 0%, Ki-67 80%, HER-2 equivocal by FISH ratio 1.17 copy #4.4, IHC 1+ negative, T4d N0 stage IIIc clinical stage    04/20/2018 Imaging   IMPRESSION: 1. Multiple masses within the left breast (inflammatory breast carcinoma.) 2. Cortically thickened right axillary lymph node,  indeterminate. Metastatic disease not excluded (patient refused biopsy)   05/09/2018 - 06/20/2018 Neo-Adjuvant Chemotherapy   Neoadjuvant chemotherapy with TC X 4   07/20/2018 Surgery   Bilateral mastectomies: Left mastectomy: Grade 3 IDC 6.5 cm, 1/2 lymph nodes positive, ER 40%, PR 0%, HER-2 negative, Ki-67 80%; right axillary lymph node 7/12 LN pos ER 0%, PR 0%, HER-2 negative, Ki-67 90% RCB class III; right mastectomy: Benign   08/02/2018 Cancer Staging   Staging form: Breast, AJCC 8th Edition - Clinical: Stage IIIC (cT4d, cN0, cM0, G3, ER+, PR-, HER2-) - Signed by Gardenia Phlegm, NP on 08/02/2018   08/02/2018 Cancer Staging   Staging form: Breast, AJCC 8th Edition - Pathologic: No Stage Recommended (ypT4d, pN1a, cM0, G3, ER-, PR-, HER2-) - Signed by Eppie Gibson, MD on 09/15/2018   10/02/2018 - 11/09/2018 Radiation Therapy   Adjuvant XRT   12/21/2018 -  Anti-estrogen oral therapy   Anastrozole '1mg'$  daily, plan for 7 years, discontinued 07/20/19 due to myalgias and joint stiffness; letrozole initiated 08/03/19     Observations/Objective:     Assessment Plan:  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 S/p lumpectomy and radiation.  03/21/2018:[place] skin anterior left breast: Ultrasound 7 o'clock position 2 cm from nipple irregular hypoechoic mass 2.2 x 2.5 x 2.5 cm (1 cm medial to prior lumpectomy); 12:00: Hypoechoic mass extending to skin 3.9 x 2.9 x 3.6 cm market skin thickening, no axillary lymph nodes  Left breast 12 o'clock position: IDC grade 3; 7 o'clock position:  IDC grade 3, ER 40%, PR 0%, Ki-67 80%, HER-2 equivocal by FISH ratio 1.17 copy #4.4, IHC 1+ negative, T4d N0 stage IIIc clinical stage  Right breast and axillary ultrasound: Prominent right axillary lymph node.Biopsy revealed metastatic poorly differentiated  carcinoma ER 0%, PR 0%, Ki-67 90%  CT CAP 04/20/18:Breast masses and axillary lymph node, no metastatic disease elsewhere ----------------------------------------------------------------------------- Treatment summary: 1. Neoadjuvant chemotherapy withTaxotere Cytoxan x4cycles completed 06/20/2018(due to cardiac issues she was not a candidate for Adriamycin) 2. Bilateral mastectomies 07/20/2018 3. Followed by Xeloda-radiation completed 11/09/2018 4. Followed by antiestrogen therapyanastrozole 1 mg daily started 12/21/2018 ---------------------------------------------------------------------------- 07/20/2018:Bilateral mastectomies:  Left mastectomy: Grade 3 IDC 6.5 cm,1/2lymph nodes positive, ER 40%, PR 0%, HER-2 negative, Ki-67 80%;  right axillary lymph node7/12 PositiveER 0%, PR 0%, HER-2 negative, Ki-67 90% RCB class III;  Right mastectomy: Benign ---------------------------------------------------- Current treatment:Anastrozole 1 mg daily started 12/21/2018 discontinued 07/20/2019 due to Severe joint and muscle pains with difficulty ambulation.  Started letrozole 08/03/2019  Hospitalization 06/10/2019-06/14/2019: Acute MI stent placement Letrozole toxicities:  Much better. Tumeric tea  Jevovah Witness Return to clinic in 6 months for follow-up  I discussed the assessment and treatment plan with the patient. The patient was provided an opportunity to ask questions and all were answered. The patient agreed with the plan and demonstrated an understanding of the instructions. The patient was advised to call back or seek an in-person evaluation if the symptoms worsen or if the condition fails to improve as anticipated.   I provided 15 minutes of non-face-to-face time during this encounter.   Holly Eisenmenger, MD 08/31/2019    I, Molly Dorshimer, am acting as scribe for Nicholas Lose, MD.  I have reviewed the above documentation for accuracy and completeness, and I agree with  the above.

## 2019-08-31 ENCOUNTER — Inpatient Hospital Stay: Payer: Medicare Other | Attending: Adult Health | Admitting: Hematology and Oncology

## 2019-08-31 ENCOUNTER — Encounter (HOSPITAL_COMMUNITY): Payer: Medicare Other

## 2019-08-31 DIAGNOSIS — C50611 Malignant neoplasm of axillary tail of right female breast: Secondary | ICD-10-CM

## 2019-08-31 DIAGNOSIS — Z171 Estrogen receptor negative status [ER-]: Secondary | ICD-10-CM

## 2019-08-31 MED ORDER — LIDOCAINE HCL 2 % IJ SOLN
INTRAMUSCULAR | Status: AC
  Filled 2019-08-31: qty 20

## 2019-08-31 NOTE — Assessment & Plan Note (Signed)
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 S/p lumpectomy and radiation.  03/21/2018:[place] skin anterior left breast: Ultrasound 7 o'clock position 2 cm from nipple irregular hypoechoic mass 2.2 x 2.5 x 2.5 cm (1 cm medial to prior lumpectomy); 12:00: Hypoechoic mass extending to skin 3.9 x 2.9 x 3.6 cm market skin thickening, no axillary lymph nodes  Left breast 12 o'clock position: IDC grade 3; 7 o'clock position: IDC grade 3, ER 40%, PR 0%, Ki-67 80%, HER-2 equivocal by FISH ratio 1.17 copy #4.4, IHC 1+ negative, T4d N0 stage IIIc clinical stage  Right breast and axillary ultrasound: Prominent right axillary lymph node.Biopsy revealed metastatic poorly differentiated carcinoma ER 0%, PR 0%, Ki-67 90%  CT CAP 04/20/18:Breast masses and axillary lymph node, no metastatic disease elsewhere ----------------------------------------------------------------------------- Treatment summary: 1. Neoadjuvant chemotherapy withTaxotere Cytoxan x4cycles completed 06/20/2018(due to cardiac issues she was not a candidate for Adriamycin) 2. Bilateral mastectomies 07/20/2018 3. Followed by Xeloda-radiation completed 11/09/2018 4. Followed by antiestrogen therapyanastrozole 1 mg daily started 12/21/2018 ---------------------------------------------------------------------------- 07/20/2018:Bilateral mastectomies:  Left mastectomy: Grade 3 IDC 6.5 cm,1/2lymph nodes positive, ER 40%, PR 0%, HER-2 negative, Ki-67 80%;  right axillary lymph node7/12 PositiveER 0%, PR 0%, HER-2 negative, Ki-67 90% RCB class III;  Right mastectomy: Benign ---------------------------------------------------- Current treatment:Anastrozole 1 mg daily started 12/21/2018 discontinued 07/20/2019 due to Severe joint and muscle pains with difficulty ambulation.  Started letrozole 08/03/2019  Hospitalization 06/10/2019-06/14/2019: Acute MI stent placement Letrozole toxicities:     Return to clinic in 6 months for follow-up

## 2019-09-01 MED FILL — PAZEO 0.7% EYE DROPS: 0.7 | 25 days supply | Qty: 3 | Fill #1

## 2019-09-03 ENCOUNTER — Encounter (HOSPITAL_COMMUNITY): Payer: Medicare Other

## 2019-09-04 ENCOUNTER — Other Ambulatory Visit: Payer: Self-pay | Admitting: *Deleted

## 2019-09-04 DIAGNOSIS — G629 Polyneuropathy, unspecified: Secondary | ICD-10-CM

## 2019-09-04 MED ORDER — GABAPENTIN 300 MG PO CAPS: 300.0000 mg | ORAL_CAPSULE | Freq: Every day | ORAL | 0 refills | Status: DC

## 2019-09-04 MED FILL — ANASTROZOLE 1 MG TABLET: 1 | 90 days supply | Qty: 90 | Fill #2

## 2019-09-04 MED FILL — metFORMIN HCL 1000 MG TABS: 1000 | 90 days supply | Qty: 180 | Fill #2

## 2019-09-04 MED FILL — GABAPENTIN 300 MG CAPSULE: 300 | 90 days supply | Qty: 90 | Fill #0

## 2019-09-05 ENCOUNTER — Encounter (HOSPITAL_COMMUNITY): Payer: Medicare Other

## 2019-09-05 ENCOUNTER — Other Ambulatory Visit: Payer: Self-pay | Admitting: *Deleted

## 2019-09-05 DIAGNOSIS — G629 Polyneuropathy, unspecified: Secondary | ICD-10-CM

## 2019-09-07 ENCOUNTER — Other Ambulatory Visit: Payer: Self-pay

## 2019-09-07 ENCOUNTER — Encounter (HOSPITAL_COMMUNITY): Payer: Medicare Other

## 2019-09-07 ENCOUNTER — Ambulatory Visit (HOSPITAL_COMMUNITY)
Admission: RE | Admit: 2019-09-07 | Discharge: 2019-09-07 | Disposition: A | Discharge status: Home / Self Care | Payer: Medicare Other | Source: Ambulatory Visit | Attending: Internal Medicine | Admitting: Internal Medicine

## 2019-09-07 ENCOUNTER — Other Ambulatory Visit: Payer: Self-pay | Admitting: Hematology and Oncology

## 2019-09-07 DIAGNOSIS — M5416 Radiculopathy, lumbar region: Secondary | ICD-10-CM | POA: Insufficient documentation

## 2019-09-07 DIAGNOSIS — G629 Polyneuropathy, unspecified: Secondary | ICD-10-CM

## 2019-09-07 DIAGNOSIS — M5126 Other intervertebral disc displacement, lumbar region: Secondary | ICD-10-CM | POA: Diagnosis not present

## 2019-09-07 DIAGNOSIS — M48061 Spinal stenosis, lumbar region without neurogenic claudication: Secondary | ICD-10-CM | POA: Diagnosis not present

## 2019-09-08 ENCOUNTER — Other Ambulatory Visit: Payer: Self-pay | Admitting: Internal Medicine

## 2019-09-08 DIAGNOSIS — M5416 Radiculopathy, lumbar region: Secondary | ICD-10-CM

## 2019-09-08 NOTE — Progress Notes (Signed)
I put in orders to IR and PMR.  She needs a lumbar steroid injection and I do not care who does it.  Whoever can get her in soonest would be best.  Patient knows I put in this referral.

## 2019-09-10 ENCOUNTER — Encounter (HOSPITAL_COMMUNITY): Payer: Medicare Other

## 2019-09-12 ENCOUNTER — Encounter (HOSPITAL_COMMUNITY): Payer: Medicare Other

## 2019-09-12 ENCOUNTER — Other Ambulatory Visit: Payer: Self-pay | Admitting: Internal Medicine

## 2019-09-12 DIAGNOSIS — M5416 Radiculopathy, lumbar region: Secondary | ICD-10-CM

## 2019-09-13 ENCOUNTER — Telehealth: Payer: Self-pay | Admitting: *Deleted

## 2019-09-13 NOTE — Telephone Encounter (Signed)
This patient has extensive CAD and is S/P LAD PCI with DES 06/13/2019.  She cannot hold Brilinta or ASA for at least 6 months.  I tried to call the patient- left message on her cell to call back.  I'll also forward to her attending cardiologist Dr Percival Spanish for his input before notifying [facility] imaging.   Kerin Ransom PA-C 09/13/2019 2:22 PM

## 2019-09-13 NOTE — Telephone Encounter (Signed)
   [facility] Pre-operative Risk Assessment    Request for surgical clearance:  1. What type of surgery is being performed? LUMBAR ESI  2. When is this surgery scheduled? TBD   What type of clearance is required (medical clearance vs. Pharmacy clearance to hold med vs. Both)? MED 3. Are there any medications that need to be held prior to surgery and how long?BRIILINTA   4. Practice name and name of physician performing surgery? [facility]   5. What is your office phone number [phone]    7.   What is your office fax number [phone]  8.   Anesthesia type (None, local, MAC, general) ? LOCAL   Devra Dopp 09/13/2019, 11:16 AM  _________________________________________________________________   (provider comments below)

## 2019-09-14 ENCOUNTER — Encounter (HOSPITAL_COMMUNITY): Payer: Medicare Other

## 2019-09-14 NOTE — Telephone Encounter (Signed)
   Primary Cardiologist: Minus Breeding, MD  Chart reviewed as part of pre-operative protocol coverage. Patient was contacted 09/14/2019 in reference to pre-operative risk assessment for pending surgery as outlined below.  [name] was last seen on 06/27/19 by Jory Sims, NP.  Miss Winkelman has extensive CAD and is s/p LAD PCI with DES 06/13/2019. She cannot hold Brilinta or Aspirin for at least six months. Per her primary cardiologist, Dr. Percival Spanish, "I would not hold her DAPT for elective procedure at this stage". She has upcoming appointment with Dr. Percival Spanish 10/08/19. As such, she is not cleared for lumbar ESI at this time.  Unable to reach patient by phone. Left a voicemail to inform her of the need for uninterrupted DAPT for 6 months and reminding her of her January appointment.   I will route this recommendation to the requesting party via Epic fax function and remove from pre-op pool.  Please call with questions.   Loel Dubonnet, NP 09/14/2019, 1:01 PM

## 2019-09-14 NOTE — Telephone Encounter (Signed)
I agree that I would not hold her DAPT for an elective procedure at this stage.

## 2019-09-17 ENCOUNTER — Encounter (HOSPITAL_COMMUNITY): Payer: Medicare Other

## 2019-09-18 ENCOUNTER — Ambulatory Visit: Payer: Medicare Other | Admitting: Neurology

## 2019-09-18 MED FILL — UNIFINE PENTIPS 32GX5/32": 32G X 4 MM | 75 days supply | Qty: 300 | Fill #1

## 2019-09-18 MED FILL — UNIFINE PENTIPS 32GX5/32: 32G X 4 MM | 75 days supply | Qty: 300 | Fill #1

## 2019-09-18 MED FILL — NOVOLOG FLEXPEN SYRINGE: 100 | 83 days supply | Qty: 15 | Fill #3

## 2019-09-19 ENCOUNTER — Encounter (HOSPITAL_COMMUNITY): Payer: Medicare Other

## 2019-09-21 ENCOUNTER — Encounter (HOSPITAL_COMMUNITY): Payer: Medicare Other

## 2019-09-24 ENCOUNTER — Encounter (HOSPITAL_COMMUNITY): Payer: Medicare Other

## 2019-09-25 ENCOUNTER — Telehealth: Payer: Self-pay | Admitting: Internal Medicine

## 2019-09-25 DIAGNOSIS — G629 Polyneuropathy, unspecified: Secondary | ICD-10-CM

## 2019-09-25 MED ORDER — GABAPENTIN 300 MG PO CAPS: 300.0000 mg | ORAL_CAPSULE | Freq: Three times a day (TID) | ORAL | 0 refills | Status: DC

## 2019-09-25 MED ORDER — TRAMADOL HCL 50 MG PO TABS: 50.0000 mg | ORAL_TABLET | Freq: Two times a day (BID) | ORAL | 0 refills | Status: DC | PRN

## 2019-09-25 NOTE — Telephone Encounter (Signed)
Attempted to call pt, [name] for rtc

## 2019-09-25 NOTE — Addendum Note (Signed)
Addended by: Hulan Fray on: 09/25/2019 12:56 PM   Modules accepted: Orders

## 2019-09-25 NOTE — Telephone Encounter (Signed)
Pt calls and states she needs some pain medicine prescribed. She states she cannot come for an appt and dr Software engineer knows all about the situation. She states again she cannot come for appt, that dr Software engineer will give her something. She is suffering.

## 2019-09-25 NOTE — Telephone Encounter (Signed)
Needs refill on pain medicine ;pt contact [place], [place] [facility]

## 2019-09-25 NOTE — Telephone Encounter (Signed)
Increase gaba to TID  Tramadol at bedtime PRN to help sleep or sev pain  Can't get steroid injection 2/2 anti-plt need after ACS  Pt understands and told about sedation

## 2019-09-26 ENCOUNTER — Other Ambulatory Visit: Payer: Self-pay | Admitting: Internal Medicine

## 2019-09-26 ENCOUNTER — Encounter (HOSPITAL_COMMUNITY): Payer: Medicare Other

## 2019-09-26 MED FILL — LOSARTAN POTASSIUM 50 MG TA: 50 | 30 days supply | Qty: 30 | Fill #3

## 2019-09-26 MED FILL — LETROZOLE 2.5 MG TABLET: 2.5 | 90 days supply | Qty: 45 | Fill #0

## 2019-09-26 MED FILL — BRILINTA 90 MG TABLET: 90 | 90 days supply | Qty: 180 | Fill #1

## 2019-09-26 MED FILL — PAZEO 0.7% EYE DROPS: 0.7 | 25 days supply | Qty: 3 | Fill #2

## 2019-09-26 NOTE — Telephone Encounter (Signed)
Needs a refill on traMADol (ULTRAM) 50 MG tablet  ;pt contact [place], [place]

## 2019-10-01 ENCOUNTER — Encounter (HOSPITAL_COMMUNITY): Payer: Medicare Other

## 2019-10-01 NOTE — Telephone Encounter (Signed)
Pt is calling back regarding her medicine; pls contact [phone]

## 2019-10-01 NOTE — Telephone Encounter (Signed)
Called pt - stated she no longer get her medications from [facility] but from [facility] since they will mail. Please re-send Tramadol rx. Thanks  [facility] pharmacy removed from pt's chart.

## 2019-10-02 MED ORDER — TRAMADOL HCL 50 MG PO TABS: 50.0000 mg | ORAL_TABLET | Freq: Two times a day (BID) | ORAL | 0 refills | Status: DC | PRN

## 2019-10-02 MED FILL — traMADol HCL 50 MG TABS: 50 | 30 days supply | Qty: 60 | Fill #0

## 2019-10-03 ENCOUNTER — Encounter (HOSPITAL_COMMUNITY): Payer: Medicare Other

## 2019-10-06 NOTE — Progress Notes (Signed)
Cardiology Office Note   Date:  10/08/2019   ID:  [name], DOB 10/03/1951, MRN [id number]  PCP:  Bartholomew Crews, MD  Cardiologist:   Minus Breeding, MD   Chief Complaint  Patient presents with  . Coronary Artery Disease      History of Present Illness: [name] is a 69 y.o. female who presents for ongoing assessment and management of CAD.  Cardiac cath in the setting of NSTEMI in Sept revealed 3 vessel disease with cath on 06/14/2019 with severe stenosis of the proximal LAD, s/p PTCA/DES X 1 of proximal LAD; Severe stenosis of the 1st OM branch with severe disease in the Cx s/p successful PTCA with balloon angioplasty only of the OM branch. She was to continue DAPT with ASA and Brilinta for one year.   Since I last saw her she has continued to have leg pain as her predominant complaint.  She has had an MRI suggesting that she might benefit from some injections for nerve impingement.  She is limited walking with a walker.  The pain has been relatively significant and being managed medically but she again might need some injections.  She is not having any chest pressure, neck or arm discomfort.  Is not having any new palpitations, presyncope or syncope.  She had no weight gain or edema.    Past Medical History:  Diagnosis Date  . Arthritis    "knees, lower back" (08/18/2017)  . 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 S/p lumpectomy and radiation.  Marland Kitchen CAD (coronary artery disease) 11/07   cath 11/11 :  3V CADsee report   . Cataract    small both eyes  . Chronic lower back pain   . Chronic pain    MR 08/07/10 :  Spondylosis L4-5 with B foraminal narrowing and L4 nerve root enchroachment B.  . Chronic venous insufficiency 2012   Has had full W/U incl ECHO, LFT's, Cr, Umicro, TSH, BNP. Symptomatic treatment.  . Colonic polyp 1995   Colonoscopies 1994, 2000, and 02/02/2011. Last had 9 polyps - Tubular adenoma and  tubulovillous was the pathology results without high grade dysplasia  . Diabetes mellitus type II    Insulin dependent. Has worked with [name] R previously.  . Diastolic CHF, chronic ([place])    NL EF by echo 01/2012, Gr I dd  . Essential hypertension    Requires 5 drug therapy and still difficult to control.  Marland Kitchen GERD (gastroesophageal reflux disease)   . Heart murmur   . History of radiation therapy 10/02/18- 11/09/18   Left chest wall SCV, Left chest wall boost, Right chest wall SCV, right chest boost all received 28 fractions for a total dose of 50.04 Gy  . Hyperlipidemia    On daily statin  . Iron deficiency anemia    Ferritin was 12 in 2012. on FeSo4. Has had colon and EGD 02/02/11 that showed mild gastritis and colon polyps.  . Obesity    Morbid. HAs worked with [name] T previously.  . OSA (obstructive sleep apnea) 02/16/2007   02/2007 : Moderate AHI 35.6, O2 sat decreased to 65%, CPAP titration to 16 with AHI of 3.6. 02/2014 : Rare respiratory events with sleep disturbance, within normal limits. AHI 0.4 per hour      . OSA on CPAP   . Personal history of chemotherapy   . Personal history of radiation therapy    "to my  left breast"  . Refusal of blood transfusions as patient is Jehovah's Witness   . Seasonal allergies    "grass pollen"    Past Surgical History:  Procedure Laterality Date  . BREAST LUMPECTOMY Left 2006  . BREAST LUMPECTOMY W/ NEEDLE LOCALIZATION  2006   34 Chemo RX  . CARDIAC CATHETERIZATION N/A 08/15/2015   Procedure: Left Heart Cath and Coronary Angiography;  Surgeon: Troy Sine, MD;  Location: [facility];  Service: Cardiovascular;  Laterality: N/A;  . COLONOSCOPY W/ BIOPSIES AND POLYPECTOMY  1994, 2000, 2012, 26/018  . CORONARY STENT INTERVENTION N/A 06/13/2019   Procedure: CORONARY STENT INTERVENTION;  Surgeon: Burnell Blanks, MD;  Location: [facility];  Service: Cardiovascular;  Laterality: N/A;  . IR IMAGING GUIDED PORT INSERTION   04/17/2018  . IR REMOVAL TUN ACCESS W/ PORT W/O FL MOD SED  11/14/2018  . LEFT HEART CATH AND CORONARY ANGIOGRAPHY N/A 08/19/2017   Procedure: LEFT HEART CATH AND CORONARY ANGIOGRAPHY;  Surgeon: Belva Crome, MD;  Location: [facility];  Service: Cardiovascular;  Laterality: N/A;  . LEFT HEART CATH AND CORONARY ANGIOGRAPHY N/A 06/13/2019   Procedure: LEFT HEART CATH AND CORONARY ANGIOGRAPHY;  Surgeon: Burnell Blanks, MD;  Location: [facility];  Service: Cardiovascular;  Laterality: N/A;  . MASTECTOMY MODIFIED RADICAL Bilateral 07/20/2018  . MASTECTOMY MODIFIED RADICAL Bilateral 07/20/2018   Procedure: BILATERAL MODIFIED RADICAL MASTECTOMIES;  Surgeon: Jovita Kussmaul, MD;  Location: [facility];  Service: General;  Laterality: Bilateral;  . PORTACATH PLACEMENT N/A 04/14/2018   Procedure: ATTEMPED INSERTION PORT-A-CATH;  Surgeon: Jovita Kussmaul, MD;  Location: [facility];  Service: General;  Laterality: N/A;  . TONSILLECTOMY AND ADENOIDECTOMY  1964  . TOTAL ABDOMINAL HYSTERECTOMY  1985   "I had endometrosis"  . ULTRASOUND GUIDANCE FOR VASCULAR ACCESS  08/19/2017   Procedure: Ultrasound Guidance For Vascular Access;  Surgeon: Belva Crome, MD;  Location: [facility];  Service: Cardiovascular;;     Current Outpatient Medications  Medication Sig Dispense Refill  . ACCU-CHEK FASTCLIX LANCETS MISC Check blood sugar 3 times a day (Patient taking differently: 1 each by Other route 3 (three) times daily. ) 102 each 5  . amLODipine (NORVASC) 10 MG tablet TAKE ONE (1) TABLET BY MOUTH EVERY DAY (Patient taking differently: Take 10 mg by mouth daily. ) 90 tablet 3  . aspirin EC 81 MG tablet Take 81 mg by mouth daily.    Marland Kitchen BRILINTA 90 MG TABS tablet Take 1 tablet by mouth 2 (two) times daily.    . Coenzyme Q10 (CO Q 10) 100 MG CAPS Take 100 mg by mouth daily.     . diclofenac (FLECTOR) 1.3 % PTCH Place 1 patch onto the skin 2 (two) times daily. 30 patch 5  . furosemide (LASIX) 20 MG  tablet TAKE 1 TABLET (20 MG TOTAL) BY MOUTH AS NEEDED FOR EDEMA. (Patient taking differently: Take 20 mg by mouth daily as needed for edema. ) 90 tablet 3  . gabapentin (NEURONTIN) 300 MG capsule Take 1 capsule (300 mg total) by mouth 3 (three) times daily. 270 capsule 0  . glucose blood (ACCU-CHEK GUIDE) test strip Use to check blood sugar 3 times daily. DIAG CODE E11.319. INSULIN DEPENDENT 300 each 1  . insulin aspart (NOVOLOG FLEXPEN) 100 UNIT/ML FlexPen Inject 6 Units into the skin 3 (three) times daily with meals. 15 mL 3  . Insulin Pen Needle 32G X 4 MM  MISC Use to inject insulin 4 times daily under the skin. DIAG CODE E11.319. INSULIN DEPENDENT 380 each 1  . isosorbide mononitrate (IMDUR) 120 MG 24 hr tablet Take 1 tablet (120 mg total) by mouth 2 (two) times daily. 60 tablet 6  . letrozole (FEMARA) 2.5 MG tablet TAKE 1/2 TABLET BY MOUTH DAILY 90 tablet 0  . losartan (COZAAR) 50 MG tablet Take 1 tablet (50 mg total) by mouth daily. 90 tablet 3  . metFORMIN (GLUCOPHAGE) 1000 MG tablet Take 1 tablet (1,000 mg total) by mouth 2 (two) times daily with a meal. 180 tablet 3  . metoprolol succinate (TOPROL-XL) 100 MG 24 hr tablet TAKE ONE (1) TABLET BY MOUTH EVERY DAY (Patient taking differently: Take 100 mg by mouth daily. ) 90 tablet 3  . Multiple Vitamins-Minerals (CENTRUM SILVER PO) Take 1 tablet by mouth daily.    . nitroGLYCERIN (NITROSTAT) 0.4 MG SL tablet PLACE ONE TABLET UNDER THE TONGUE EVERY 5 MINUTES AS NEEDED FOR CHEST PAIN (Patient taking differently: Place 0.4 mg under the tongue every 5 (five) minutes as needed for chest pain. ) 25 tablet 7  . Olopatadine HCl (PATADAY) 0.2 % SOLN Place 1 drop into both eyes daily as needed (for allergies).     Marland Kitchen omeprazole (PRILOSEC) 40 MG capsule Take 1 capsule (40 mg total) by mouth daily. (Patient taking differently: Take 40 mg by mouth daily as needed (for acid reflux). ) 30 capsule 5  . pyridoxine (B-6) 100 MG tablet Take 100 mg by mouth daily.      . RESTASIS 0.05 % ophthalmic emulsion 1 drop 2 (two) times daily.    Marland Kitchen spironolactone (ALDACTONE) 25 MG tablet Take 2 tablets (50 mg total) by mouth daily. 180 tablet 3  . TOUJEO SOLOSTAR 300 UNIT/ML SOPN INJECT 35 UNITS INTO THE SKIN DAILY (Patient taking differently: Inject 35 Units into the skin daily. ) 9 mL 11  . traMADol (ULTRAM) 50 MG tablet Take 1 tablet (50 mg total) by mouth every 12 (twelve) hours as needed for severe pain. 60 tablet 0  . vitamin B-12 (CYANOCOBALAMIN) 1000 MCG tablet Take 1,000 mcg by mouth daily.    . rosuvastatin (CRESTOR) 5 MG tablet Take 1 tablet (5 mg total) by mouth daily. 30 tablet 11   No current facility-administered medications for this visit.    Allergies:   Blood-group specific substance, Lisinopril, Oxycodone-acetaminophen, Percocet [oxycodone-acetaminophen], Victoza [liraglutide], and Atorvastatin   ROS:  Please see the history of present illness.   Otherwise, review of systems are positive for none.   All other systems are reviewed and negative.    PHYSICAL EXAM: VS:  BP 130/62   Pulse 70   Temp 97.7 F (36.5 C)   Ht 5' (1.524 m)   Wt 206 lb 3.2 oz (93.5 kg)   LMP 07/12/1984   SpO2 98%   BMI 40.27 kg/m  , BMI Body mass index is 40.27 kg/m. GENERAL:  Well appearing NECK:  No jugular venous distention, waveform within normal limits, carotid upstroke brisk and symmetric, no bruits, no thyromegaly LUNGS:  Clear to auscultation bilaterally CHEST:  Unremarkable, bilateral mastectomies HEART:  PMI not displaced or sustained,S1 and S2 within normal limits, no S3, no S4, no clicks, no rubs, soft brief apical systolic murmur, no diastolic murmurs ABD:  Flat, positive bowel sounds normal in frequency in pitch, no bruits, no rebound, no guarding, no midline pulsatile mass, no hepatomegaly, no splenomegaly EXT:  2 plus pulses throughout, no edema, no  cyanosis no clubbing   EKG:  EKG is ordered today. The ekg ordered today demonstrates sinus rhythm,  rate 78, axis within normal limits, intervals within normal limits, inferolateral T wave inversions unchanged from previous.   Recent Labs: 06/10/2019: B Natriuretic Peptide 250.1; Magnesium 1.8; TSH 0.631 06/14/2019: Hemoglobin 10.5; Platelets 179 06/21/2019: ALT 29; BUN 18; Creatinine, Ser 0.95; Potassium 4.3; Sodium 140    Lipid Panel    Component Value Date/Time   CHOL 214 (H) 06/11/2019 0036   CHOL 185 11/04/2017 1041   TRIG 33 06/11/2019 0036   HDL 66 06/11/2019 0036   HDL 57 11/04/2017 1041   CHOLHDL 3.2 06/11/2019 0036   VLDL 7 06/11/2019 0036   LDLCALC 141 (H) 06/11/2019 0036   LDLCALC 112 (H) 11/04/2017 1041   LDLDIRECT 153 (H) 07/13/2011 1055      Wt Readings from Last 3 Encounters:  10/08/19 206 lb 3.2 oz (93.5 kg)  08/14/19 202 lb 2.9 oz (91.7 kg)  08/07/19 202 lb 9.6 oz (91.9 kg)      Other studies Reviewed: Additional studies/ records that were reviewed today include: Labs. Review of the above records demonstrates:  Please see elsewhere in the note.     ASSESSMENT AND PLAN:  Coronary artery disease:  The patient has no new sypmtoms.  No further cardiovascular testing is indicated.  We will continue with aggressive risk reduction and meds as listed.  We talked at length about the indications for anticoagulation with dual anticoagulant therapy.  If he absolutely needs injections because the pain becomes unbearable will order potential loss of function.  Talked about stopping the Brilinta before the 1 year anniversary of her angioplasty.  She will call me and let me know.  Hypertension:  Blood pressure is controlled.  Continue meds as listed.  Hyperlipidemia:  She ran out of her Crestor and wondered why she did have renewals.  I will renew this.  She does not tolerate at all higher doses.  If in 10 weeks for lipid level between her change in diet that she has been slowly statin medications is not at target LDL less than 70 and I will refer her to start PCSK9  inhibitor.  She and I discussed this.   Type 2 diabetes: A1c is elevated at 8.1.  She is working with her primary provider on this.  Covid education: He has an allergy to vaccinations and will not take the flu vaccine..  Discussed.   Current medicines are reviewed at length with the patient today.  The patient does not have concerns regarding medicines.  The following changes have been made:  no change  Labs/ tests ordered today include:   Orders Placed This Encounter  Procedures  . Hepatic function panel  . Lipid Profile  . EKG 12-Lead     Disposition:   FU with me or APP in six months.     Signed, Minus Breeding, MD  10/08/2019 10:22 AM    [facility]

## 2019-10-08 ENCOUNTER — Other Ambulatory Visit: Payer: Self-pay

## 2019-10-08 ENCOUNTER — Ambulatory Visit (INDEPENDENT_AMBULATORY_CARE_PROVIDER_SITE_OTHER): Payer: Medicare Other | Admitting: Cardiology

## 2019-10-08 ENCOUNTER — Encounter (INDEPENDENT_AMBULATORY_CARE_PROVIDER_SITE_OTHER): Payer: Self-pay

## 2019-10-08 ENCOUNTER — Encounter: Payer: Self-pay | Admitting: Cardiology

## 2019-10-08 DIAGNOSIS — I2510 Atherosclerotic heart disease of native coronary artery without angina pectoris: Secondary | ICD-10-CM | POA: Diagnosis not present

## 2019-10-08 DIAGNOSIS — E118 Type 2 diabetes mellitus with unspecified complications: Secondary | ICD-10-CM

## 2019-10-08 DIAGNOSIS — E785 Hyperlipidemia, unspecified: Secondary | ICD-10-CM

## 2019-10-08 DIAGNOSIS — Z794 Long term (current) use of insulin: Secondary | ICD-10-CM

## 2019-10-08 MED ORDER — ROSUVASTATIN CALCIUM 5 MG PO TABS: 5.0000 mg | ORAL_TABLET | Freq: Every day | ORAL | 11 refills | Status: DC

## 2019-10-08 MED FILL — ROSUVASTATIN CALCIUM 5 MG T: 5 | 30 days supply | Qty: 30 | Fill #0

## 2019-10-08 MED FILL — FUROSEMIDE 20 MG TABS: 20 | 90 days supply | Qty: 90 | Fill #3

## 2019-10-08 NOTE — Patient Instructions (Signed)
Medication Instructions:  YOUR ROSUVASTATIN (CRESTOR) HAS BEEN SENT TO [facility]   *If you need a refill on your cardiac medications before your next appointment, please call your pharmacy*  Lab Work: FASTING LP/LIVER PANEL IN 10 WEEKS  Testing/Procedures: NONE   Follow-Up: At [facility], you and your health needs are our priority.  As part of our continuing mission to provide you with exceptional heart care, we have created designated Provider Care Teams.  These Care Teams include your primary Cardiologist (physician) and Advanced Practice Providers (APPs -  Physician Assistants and Nurse Practitioners) who all work together to provide you with the care you need, when you need it.  Your next appointment:   6 month(s) You will receive a reminder letter in the mail two months in advance. If you don't receive a letter, please call our office to schedule the follow-up appointment.   The format for your next appointment:   In Person  Provider:   You may see Minus Breeding, MD or one of the following Advanced Practice Providers on your designated Care Team:    Kerin Ransom, PA-C  [place], [place]  Coletta Memos, [place]

## 2019-10-16 ENCOUNTER — Encounter: Payer: Self-pay | Admitting: *Deleted

## 2019-10-18 NOTE — Addendum Note (Signed)
Addended by: Yvonna Alanis E on: 10/18/2019 01:20 PM   Modules accepted: Orders

## 2019-10-18 NOTE — Progress Notes (Signed)
Patient Care Team: Bartholomew Crews, MD as PCP - General (Internal Medicine) Minus Breeding, MD as PCP - Cardiology (Cardiology) Hayden Pedro, MD as Consulting Physician (Ophthalmology) Shirley Muscat Loreen Freud, MD as Referring Physician (Optometry) Minus Breeding, MD as Consulting Physician (Cardiology)  DIAGNOSIS:    ICD-10-CM   1. 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 S/p lumpectomy and radiation.   03/21/2018 Initial Diagnosis   Peau d' Orange skin anterior left breast: Ultrasound 7 o'clock position 2 cm from nipple irregular hypoechoic mass 2.2 x 2.5 x 2.5 cm (1 cm medial to prior lumpectomy); 12:00: Hypoechoic mass extending to skin 3.9 x 2.9 x 3.6 cm market skin thickening, no axillary lymph nodes   03/21/2018 Initial Biopsy   Left breast 12 o'clock position: IDC grade 3; 7 o'clock position: IDC grade 3, ER 40%, PR 0%, Ki-67 80%, HER-2 equivocal by FISH ratio 1.17 copy #4.4, IHC 1+ negative, T4d N0 stage IIIc clinical stage    04/20/2018 Imaging   IMPRESSION: 1. Multiple masses within the left breast (inflammatory breast carcinoma.) 2. Cortically thickened right axillary lymph node, indeterminate. Metastatic disease not excluded (patient refused biopsy)   05/09/2018 - 06/20/2018 Neo-Adjuvant Chemotherapy   Neoadjuvant chemotherapy with TC X 4   07/20/2018 Surgery   Bilateral mastectomies: Left mastectomy: Grade 3 IDC 6.5 cm, 1/2 lymph nodes positive, ER 40%, PR 0%, HER-2 negative, Ki-67 80%; right axillary lymph node 7/12 LN pos ER 0%, PR 0%, HER-2 negative, Ki-67 90% RCB class III; right mastectomy: Benign   08/02/2018 Cancer Staging   Staging form:  Breast, AJCC 8th Edition - Clinical: Stage IIIC (cT4d, cN0, cM0, G3, ER+, PR-, HER2-) - Signed by Gardenia Phlegm, NP on 08/02/2018   08/02/2018 Cancer Staging   Staging form: Breast, AJCC 8th Edition - Pathologic: No Stage Recommended (ypT4d, pN1a, cM0, G3, ER-, PR-, HER2-) - Signed by Eppie Gibson, MD on 09/15/2018   10/02/2018 - 11/09/2018 Radiation Therapy   Adjuvant XRT   12/21/2018 -  Anti-estrogen oral therapy   Anastrozole 17m daily, plan for 7 years, discontinued 07/20/19 due to myalgias and joint stiffness; letrozole initiated 08/03/19     CHIEF COMPLIANT: Follow-up of left breast cancer on letrozole  INTERVAL HISTORY: Holly MOROis a 69y.o. with above-mentioned history of left breast cancer in 2006 with recurrence in the left breast and axilla who underwent neoadjuvant chemotherapy with Taxotere and Cytoxan followed by bilateral mastectomies and adjuvant radiation. She is currently on antiestrogen therapy with letrozole after she could not tolerate anastrozole. She presents to the clinic today for follow-up.     ALLERGIES:  is allergic to blood-group specific substance; lisinopril; oxycodone-acetaminophen; percocet [oxycodone-acetaminophen]; victoza [liraglutide]; and atorvastatin.  MEDICATIONS:  Current Outpatient Medications  Medication Sig Dispense Refill  . ACCU-CHEK FASTCLIX LANCETS MISC Check blood sugar 3 times a day (Patient taking differently: 1 each by Other route 3 (three) times daily. ) 102 each 5  . amLODipine (NORVASC) 10 MG tablet TAKE ONE (1) TABLET BY MOUTH EVERY DAY (Patient taking differently: Take 10 mg by mouth daily. ) 90 tablet 3  . aspirin EC 81 MG tablet Take 81 mg by mouth daily.    .Marland Kitchen  BRILINTA 90 MG TABS tablet Take 1 tablet by mouth 2 (two) times daily.    . Coenzyme Q10 (CO Q 10) 100 MG CAPS Take 100 mg by mouth daily.     . diclofenac (FLECTOR) 1.3 % PTCH Place 1 patch onto the skin 2 (two) times daily. 30 patch 5  . furosemide  (LASIX) 20 MG tablet TAKE 1 TABLET (20 MG TOTAL) BY MOUTH AS NEEDED FOR EDEMA. (Patient taking differently: Take 20 mg by mouth daily as needed for edema. ) 90 tablet 3  . gabapentin (NEURONTIN) 300 MG capsule Take 1 capsule (300 mg total) by mouth 3 (three) times daily. 270 capsule 0  . glucose blood (ACCU-CHEK GUIDE) test strip Use to check blood sugar 3 times daily. DIAG CODE E11.319. INSULIN DEPENDENT 300 each 1  . insulin aspart (NOVOLOG FLEXPEN) 100 UNIT/ML FlexPen Inject 6 Units into the skin 3 (three) times daily with meals. 15 mL 3  . Insulin Pen Needle 32G X 4 MM MISC Use to inject insulin 4 times daily under the skin. DIAG CODE E11.319. INSULIN DEPENDENT 380 each 1  . isosorbide mononitrate (IMDUR) 120 MG 24 hr tablet Take 1 tablet (120 mg total) by mouth 2 (two) times daily. 60 tablet 6  . letrozole (FEMARA) 2.5 MG tablet TAKE 1/2 TABLET BY MOUTH DAILY 90 tablet 0  . losartan (COZAAR) 50 MG tablet Take 1 tablet (50 mg total) by mouth daily. 90 tablet 3  . metFORMIN (GLUCOPHAGE) 1000 MG tablet Take 1 tablet (1,000 mg total) by mouth 2 (two) times daily with a meal. 180 tablet 3  . metoprolol succinate (TOPROL-XL) 100 MG 24 hr tablet TAKE ONE (1) TABLET BY MOUTH EVERY DAY (Patient taking differently: Take 100 mg by mouth daily. ) 90 tablet 3  . Multiple Vitamins-Minerals (CENTRUM SILVER PO) Take 1 tablet by mouth daily.    . nitroGLYCERIN (NITROSTAT) 0.4 MG SL tablet PLACE ONE TABLET UNDER THE TONGUE EVERY 5 MINUTES AS NEEDED FOR CHEST PAIN (Patient taking differently: Place 0.4 mg under the tongue every 5 (five) minutes as needed for chest pain. ) 25 tablet 7  . Olopatadine HCl (PATADAY) 0.2 % SOLN Place 1 drop into both eyes daily as needed (for allergies).     Marland Kitchen omeprazole (PRILOSEC) 40 MG capsule Take 1 capsule (40 mg total) by mouth daily. (Patient taking differently: Take 40 mg by mouth daily as needed (for acid reflux). ) 30 capsule 5  . pyridoxine (B-6) 100 MG tablet Take 100 mg by  mouth daily.    . RESTASIS 0.05 % ophthalmic emulsion 1 drop 2 (two) times daily.    . rosuvastatin (CRESTOR) 5 MG tablet Take 1 tablet (5 mg total) by mouth daily. 30 tablet 11  . spironolactone (ALDACTONE) 25 MG tablet Take 2 tablets (50 mg total) by mouth daily. 180 tablet 3  . TOUJEO SOLOSTAR 300 UNIT/ML SOPN INJECT 35 UNITS INTO THE SKIN DAILY (Patient taking differently: Inject 35 Units into the skin daily. ) 9 mL 11  . traMADol (ULTRAM) 50 MG tablet Take 1 tablet (50 mg total) by mouth every 12 (twelve) hours as needed for severe pain. 60 tablet 0  . vitamin B-12 (CYANOCOBALAMIN) 1000 MCG tablet Take 1,000 mcg by mouth daily.     No current facility-administered medications for this visit.    PHYSICAL EXAMINATION: ECOG PERFORMANCE STATUS: 1 - Symptomatic but completely ambulatory  Vitals:   10/19/19 0944  BP: 126/66  Pulse: 83  Resp: 18  Temp: 98.7 F (37.1 C)  SpO2: 99%   Filed Weights   10/19/19 0944  Weight: 206 lb 8 oz (93.7 kg)    LABORATORY DATA:  I have reviewed the data as listed CMP Latest Ref Rng & Units 06/21/2019 06/14/2019 06/13/2019  Glucose 65 - 99 mg/dL 220(H) 116(H) 167(H)  BUN 8 - 27 mg/dL _0 Creatinine 0.57 - 1.00 mg/dL 0.95 0.78 0.93  Sodium 134 - 144 mmol/L 140 136 138  Potassium 3.5 - 5.2 mmol/L 4.3 4.7 4.3  Chloride 96 - 106 mmol/L 104 107 110  CO2 20 - 29 mmol/L 21 20(L) 21(L)  Calcium 8.7 - 10.3 mg/dL 10.0 8.7(L) 8.8(L)  Total Protein 6.0 - 8.5 g/dL 7.0 7.4 -  Total Bilirubin 0.0 - 1.2 mg/dL 0.3 1.0 -  Alkaline Phos 39 - 117 IU/L 80 92 -  AST 0 - 40 IU/L 16 88(H) -  ALT 0 - 32 IU/L 29 122(H) -    Lab Results  Component Value Date   WBC 5.7 06/14/2019   HGB 10.5 (L) 06/14/2019   HCT 31.0 (L) 06/14/2019   MCV 90.1 06/14/2019   PLT 179 06/14/2019   NEUTROABS 6.3 06/12/2019    ASSESSMENT & PLAN:  Malignant neoplasm of lower-inner quadrant of left breast in female, estrogen receptor positive [facility]) 2006 June : L ductal carcinoma  in situ with papillary features, high grade. S/p lumpectomy and radiation.  03/21/2018:Peau d' Orange skin anterior left breast: Ultrasound 7 o'clock position 2 cm from nipple irregular hypoechoic mass 2.2 x 2.5 x 2.5 cm (1 cm medial to prior lumpectomy); 12:00: Hypoechoic mass extending to skin 3.9 x 2.9 x 3.6 cm market skin thickening, no axillary lymph nodes  Left breast 12 o'clock position: IDC grade 3; 7 o'clock position: IDC grade 3, ER 40%, PR 0%, Ki-67 80%, HER-2 equivocal by FISH ratio 1.17 copy #4.4, IHC 1+ negative, T4d N0 stage IIIc clinical stage  Right breast and axillary ultrasound: Prominent right axillary lymph node.Biopsy revealed metastatic poorly differentiated carcinoma ER 0%, PR 0%, Ki-67 90%  CT CAP 04/20/18:Breast masses and axillary lymph node, no metastatic disease elsewhere ----------------------------------------------------------------------------- Treatment summary: 1. Neoadjuvant chemotherapy withTaxotere Cytoxan x4cycles completed 06/20/2018(due to cardiac issues she was not a candidate for Adriamycin) 2. Bilateral mastectomies 07/20/2018 3. Followed by Xeloda-radiation completed 11/09/2018 4. Followed by antiestrogen therapyanastrozole 1 mg daily started 12/21/2018 ---------------------------------------------------------------------------- 07/20/2018:Bilateral mastectomies:  Left mastectomy: Grade 3 IDC 6.5 cm,1/2lymph nodes positive, ER 40%, PR 0%, HER-2 negative, Ki-67 80%;  right axillary lymph node7/12 PositiveER 0%, PR 0%, HER-2 negative, Ki-67 90% RCB class III;  Right mastectomy: Benign ---------------------------------------------------- Current treatment:Anastrozole 1 mg daily started 3/19/2020discontinued 07/20/2019 due toSevere joint and muscle pains with difficulty ambulation.  Started letrozole 08/03/2019  Hospitalization 06/10/2019-06/14/2019: Acute MI stent placement Letrozole toxicities: 1.  Arthralgias myalgias: Improved  2.  Occasional hot flashes  Breast cancer surveillance: Physical examination of chest wall 10/19/2019: Benign She is a Jehovah's Witness. Return to clinic in 1 year for follow-up    No orders of the defined types were placed in this encounter.  The patient has a good understanding of the overall plan. she agrees with it. she will call with any problems that may develop before the next visit here.  Total time spent: 15 mins including face to face time and time spent for planning, charting and coordination of care  Nicholas Lose, MD 10/19/2019  I, Cloyde Reams Dorshimer, am acting as scribe for Dr. Nicholas Lose.  I  have reviewed the above documentation for accuracy and completeness, and I agree with the above.

## 2019-10-19 ENCOUNTER — Inpatient Hospital Stay: Payer: Medicare Other | Attending: Adult Health | Admitting: Hematology and Oncology

## 2019-10-19 ENCOUNTER — Other Ambulatory Visit: Payer: Self-pay

## 2019-10-19 DIAGNOSIS — Z170 Estrogen receptor positive status [ER+]: Secondary | ICD-10-CM | POA: Insufficient documentation

## 2019-10-19 DIAGNOSIS — C50312 Malignant neoplasm of lower-inner quadrant of left female breast: Secondary | ICD-10-CM | POA: Diagnosis not present

## 2019-10-19 DIAGNOSIS — I2510 Atherosclerotic heart disease of native coronary artery without angina pectoris: Secondary | ICD-10-CM | POA: Diagnosis not present

## 2019-10-19 DIAGNOSIS — C773 Secondary and unspecified malignant neoplasm of axilla and upper limb lymph nodes: Secondary | ICD-10-CM | POA: Diagnosis not present

## 2019-10-19 DIAGNOSIS — Z79811 Long term (current) use of aromatase inhibitors: Secondary | ICD-10-CM | POA: Insufficient documentation

## 2019-10-19 MED ORDER — LETROZOLE 2.5 MG PO TABS: 1.2500 mg | ORAL_TABLET | Freq: Every day | ORAL | 3 refills | Status: AC

## 2019-10-19 NOTE — Assessment & Plan Note (Signed)
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 S/p lumpectomy and radiation.  03/21/2018:Peau d' Orange skin anterior left breast: Ultrasound 7 o'clock position 2 cm from nipple irregular hypoechoic mass 2.2 x 2.5 x 2.5 cm (1 cm medial to prior lumpectomy); 12:00: Hypoechoic mass extending to skin 3.9 x 2.9 x 3.6 cm market skin thickening, no axillary lymph nodes  Left breast 12 o'clock position: IDC grade 3; 7 o'clock position: IDC grade 3, ER 40%, PR 0%, Ki-67 80%, HER-2 equivocal by FISH ratio 1.17 copy #4.4, IHC 1+ negative, T4d N0 stage IIIc clinical stage  Right breast and axillary ultrasound: Prominent right axillary lymph node.Biopsy revealed metastatic poorly differentiated carcinoma ER 0%, PR 0%, Ki-67 90%  CT CAP 04/20/18:Breast masses and axillary lymph node, no metastatic disease elsewhere ----------------------------------------------------------------------------- Treatment summary: 1. Neoadjuvant chemotherapy withTaxotere Cytoxan x4cycles completed 06/20/2018(due to cardiac issues she was not a candidate for Adriamycin) 2. Bilateral mastectomies 07/20/2018 3. Followed by Xeloda-radiation completed 11/09/2018 4. Followed by antiestrogen therapyanastrozole 1 mg daily started 12/21/2018 ---------------------------------------------------------------------------- 07/20/2018:Bilateral mastectomies:  Left mastectomy: Grade 3 IDC 6.5 cm,1/2lymph nodes positive, ER 40%, PR 0%, HER-2 negative, Ki-67 80%;  right axillary lymph node7/12 PositiveER 0%, PR 0%, HER-2 negative, Ki-67 90% RCB class III;  Right mastectomy: Benign ---------------------------------------------------- Current treatment:Anastrozole 1 mg daily started 3/19/2020discontinued 07/20/2019 due toSevere joint and muscle pains with difficulty ambulation.  Started letrozole 08/03/2019  Hospitalization 06/10/2019-06/14/2019: Acute MI stent placement Letrozole toxicities: 1.   Arthralgias myalgias: Improved 2.  Occasional hot flashes  Breast cancer surveillance: Physical examination of chest wall 10/19/2019: Benign She is a Jehovah's Witness. Return to clinic in 1 year for follow-up

## 2019-10-21 MED FILL — PAZEO 0.7% EYE DROPS: 0.7 | 25 days supply | Qty: 3 | Fill #3

## 2019-10-22 ENCOUNTER — Telehealth: Payer: Self-pay | Admitting: Hematology and Oncology

## 2019-10-22 NOTE — Telephone Encounter (Signed)
I could not reach patient regarding schedule  °

## 2019-10-23 NOTE — Addendum Note (Signed)
Addended by: Hulan Fray on: 10/23/2019 04:08 PM   Modules accepted: Orders

## 2019-10-26 MED FILL — LOSARTAN POTASSIUM 50 MG TA: 50 | 30 days supply | Qty: 30 | Fill #4

## 2019-10-31 MED FILL — traMADol HCL 50 MG TABS: 50 | 30 days supply | Qty: 60 | Fill #0

## 2019-11-02 ENCOUNTER — Other Ambulatory Visit: Payer: Self-pay | Admitting: Internal Medicine

## 2019-11-02 MED FILL — ROSUVASTATIN CALCIUM 5 MG T: 5 | 30 days supply | Qty: 30 | Fill #1

## 2019-11-15 MED FILL — METOPROLOL SUCCINATE ER 100: 100 | 90 days supply | Qty: 90 | Fill #2

## 2019-11-15 MED FILL — TOUJEO SOLOSTAR 300 UNITS/M: 300 | 77 days supply | Qty: 9 | Fill #0

## 2019-11-15 MED FILL — AMLODIPINE BESYLATE 10 MG T: 10 | 90 days supply | Qty: 90 | Fill #2

## 2019-11-22 ENCOUNTER — Encounter: Payer: Medicare Other | Admitting: Internal Medicine

## 2019-11-26 MED FILL — LOSARTAN POTASSIUM 50 MG TA: 50 | 30 days supply | Qty: 30 | Fill #5

## 2019-11-27 ENCOUNTER — Other Ambulatory Visit: Payer: Self-pay | Admitting: *Deleted

## 2019-11-27 DIAGNOSIS — G629 Polyneuropathy, unspecified: Secondary | ICD-10-CM

## 2019-11-27 MED ORDER — GABAPENTIN 300 MG PO CAPS: 300.0000 mg | ORAL_CAPSULE | Freq: Three times a day (TID) | ORAL | 1 refills | Status: AC

## 2019-11-27 MED FILL — GABAPENTIN 300 MG CAPSULE: 300 | 90 days supply | Qty: 90 | Fill #0

## 2019-11-28 MED FILL — traMADol HCL 50 MG TABS: 50 | 30 days supply | Qty: 60 | Fill #0

## 2019-11-29 ENCOUNTER — Ambulatory Visit (INDEPENDENT_AMBULATORY_CARE_PROVIDER_SITE_OTHER): Payer: Medicare Other | Admitting: Dietician

## 2019-11-29 ENCOUNTER — Telehealth: Payer: Self-pay | Admitting: *Deleted

## 2019-11-29 ENCOUNTER — Encounter: Payer: Self-pay | Admitting: Internal Medicine

## 2019-11-29 ENCOUNTER — Encounter: Payer: Self-pay | Admitting: Dietician

## 2019-11-29 ENCOUNTER — Ambulatory Visit (INDEPENDENT_AMBULATORY_CARE_PROVIDER_SITE_OTHER): Payer: Medicare Other | Admitting: Internal Medicine

## 2019-11-29 VITALS — BP 134/64 | HR 82 | Wt 205.6 lb

## 2019-11-29 DIAGNOSIS — Z79899 Other long term (current) drug therapy: Secondary | ICD-10-CM | POA: Diagnosis not present

## 2019-11-29 DIAGNOSIS — I5032 Chronic diastolic (congestive) heart failure: Secondary | ICD-10-CM

## 2019-11-29 DIAGNOSIS — I25110 Atherosclerotic heart disease of native coronary artery with unstable angina pectoris: Secondary | ICD-10-CM | POA: Diagnosis not present

## 2019-11-29 DIAGNOSIS — I700 Atherosclerosis of aorta: Secondary | ICD-10-CM | POA: Diagnosis not present

## 2019-11-29 DIAGNOSIS — Z794 Long term (current) use of insulin: Secondary | ICD-10-CM | POA: Diagnosis not present

## 2019-11-29 DIAGNOSIS — E1165 Type 2 diabetes mellitus with hyperglycemia: Secondary | ICD-10-CM

## 2019-11-29 DIAGNOSIS — IMO0002 Reserved for concepts with insufficient information to code with codable children: Secondary | ICD-10-CM

## 2019-11-29 DIAGNOSIS — Z7982 Long term (current) use of aspirin: Secondary | ICD-10-CM | POA: Diagnosis not present

## 2019-11-29 DIAGNOSIS — I200 Unstable angina: Secondary | ICD-10-CM

## 2019-11-29 DIAGNOSIS — E11319 Type 2 diabetes mellitus with unspecified diabetic retinopathy without macular edema: Secondary | ICD-10-CM | POA: Diagnosis not present

## 2019-11-29 DIAGNOSIS — M549 Dorsalgia, unspecified: Secondary | ICD-10-CM

## 2019-11-29 LAB — POCT GLYCOSYLATED HEMOGLOBIN (HGB A1C): Hemoglobin A1C: 8.7 % — AB (ref 4.0–5.6)

## 2019-11-29 LAB — GLUCOSE, CAPILLARY: Glucose-Capillary: 201 mg/dL — ABNORMAL HIGH (ref 70–99)

## 2019-11-29 MED ORDER — FREESTYLE LIBRE 2 READER DEVI: 1.0000 | Freq: Every day | 1 refills | Status: AC

## 2019-11-29 MED ORDER — FREESTYLE LIBRE 2 SENSOR MISC: 1.0000 | Freq: Every day | 3 refills | Status: AC

## 2019-11-29 NOTE — Progress Notes (Signed)
Documentation for Freestyle Libre Pro Continuous glucose monitoring Freestyle Libre Pro CGM sensor placed today. Patient was educated about wearing sensor, keeping food, activity and medication log and when to call office. Patient was educated about how to care for the sensor and not to have an MRI, CT or Diathermy while wearing the sensor. Follow up was arranged with the patient for 1 week.   Ms [name] is interested in using a personal CGM. Her doctor has requested one for her.   Lot #: [id number] Serial #: [id number] Expiration Date: 03/03/2020  [name], RD 11/29/2019 11:22 AM/

## 2019-11-29 NOTE — Assessment & Plan Note (Signed)
This problem is chronic and controlled.  It was an incidental finding on a chest CT in November 2018.  She currently is on a statin, aspirin.  We are working on better diabetes control and her blood pressure is at goal.  Plan: follow

## 2019-11-29 NOTE — Assessment & Plan Note (Signed)
This problem is chronic and stable.  She has severe three-vessel CAD which is being treated medically.  She is on Imdur chronically which is controlling her unstable angina.  She does get chest pain when she is volume overloaded.  I reviewed Dr. Rosezella Florida Jan office note which indicated he wanted her to continue the Crestor 5 mg and plan on repeating a fasting lipid panel in mid March.  If her lipids are not at goal, he was going to start a PC SK inhibitor.  PLAN : Continue medications Patient is fasting and wanted to get her lipid panel today

## 2019-11-29 NOTE — Assessment & Plan Note (Signed)
This problem is chronic and uncontrolled.  Her A1c trend has been 8.9 - 8.1 - 8.7 today.  She increased her Toujeo per my instructions 1 week ago from 30 units to 35 units.  She is using NovoLog 3 times daily 6 units with meals.  She is also on Metformin 1000 BID.  She forgot her meter today but states most of them have been above 200 as high as 355.  Her lowest was 147.  She is working extensively with [name].  We discussed options today and she is going to remain on Toujeo 35, increase NovoLog to 8 units with meals 3 times a day, continue Metformin, and have a professional CGM monitor placed.  She is also interested in a personal CGM and we discussed that she will have to have QID CBG testing documented..I collaborated with [name], our diabetes educator, about both professional and personal CGM.  [name] has submitted the paperwork to get personal CGM starting.  [name] also placed a professional CGM today and she will return in 1 week for an [facility] appointment and to work with [name].  Her basal bolus ratio is slightly off with too much basal but this can be worked out with the CGM results.  PLAN : Toujeo 35 daily NovoLog 8 with meals 3 times daily Metformin 1000 twice daily Professional CGM and return in 1 week Work on getting personal CGM

## 2019-11-29 NOTE — Assessment & Plan Note (Signed)
This problem is chronic and uncontrolled.  Her dry weught seems to be about 194.  Today, on our clinic scales, she is 205 but she states that her symptoms are improving because previously at home, she was 216 pounds.  When her weight is up, she feels bad and describes orthopnea, trouble breathing, wheezing.  She is taking her Spironolactone 25 twice daily and uses the Lasix as needed based on her weight and symptoms.  She has not tolerated higher doses or more frequent doses of Lasix due to muscle cramping.  Her weight is downtrending so we will continue to follow at this time.  PLAN:  Cont current meds

## 2019-11-29 NOTE — Patient Instructions (Signed)
Please record the time, amount and what food drinks and activities you have while wearing the continuous glucose monitor (CGM).  Bring the folder with you to follow up appointments. If your monitor falls off, please place it in the bag provided in your folder and bring it back with you to your next appointment.   Do not have a CT or an MRI while wearing the CGM.   1 week visit has been set up with me and a doctor for the first of two CGM downloads.   You will also return in 2 weeks to have your second download and the CGM removed.  [name], RD 11/29/2019 11:28 AM

## 2019-11-29 NOTE — Patient Instructions (Signed)
1. I will mail you lipid results 2. Talk to donna about continuous glucose monitor  Please return in 7 days with [place] for Download #1.

## 2019-11-29 NOTE — Telephone Encounter (Signed)
Call made to patient-after no showing for appt today with Dr Lynnae January.  No answer and both home and mobile numbers-message left on recorder for pt to call back to reschedule appt.[name]2/25/20219:43 AM

## 2019-11-29 NOTE — Progress Notes (Signed)
   Subjective:    Patient ID: [name], female    DOB: [birth year], 69 y.o.   MRN: [id number]  HPI  [name] is here for hyperglycemia. Please see the A&P for the status of the pt's chronic medical problems.  ROS : per ROS section and in problem oriented charting. All other systems are negative.  PMHx, Soc hx, and / or Fam hx : Wants to get fasting lipid today to send to Dr Percival Spanish   Review of Systems  Constitutional: Negative for unexpected weight change.  Respiratory: Positive for shortness of breath.   Cardiovascular:       Intermittent orthopnea  Gastrointestinal:       Bloating  Musculoskeletal: Positive for back pain.       Objective:   Physical Exam Constitutional:      General: She is not in acute distress.    Appearance: Normal appearance. She is obese. She is not ill-appearing, toxic-appearing or diaphoretic.  HENT:     Head: Normocephalic and atraumatic.     Right Ear: External ear normal.     Left Ear: External ear normal.  Eyes:     General: No scleral icterus.       Right eye: No discharge.        Left eye: No discharge.     Extraocular Movements: Extraocular movements intact.     Conjunctiva/sclera: Conjunctivae normal.  Neurological:     General: No focal deficit present.     Mental Status: She is alert. Mental status is at baseline.  Psychiatric:        Mood and Affect: Mood normal.        Behavior: Behavior normal.        Thought Content: Thought content normal.        Judgment: Judgment normal.       Assessment & Plan:  Billing notes She has uncontrolled diastolic dysfunction which requires treatment with diuretics which causes the side effect of muscle cramping I reviewed Dr. Rosezella Florida January note, ordered a fasting lipid panel, reviewed the results of her most recent lipid panel, and discussed her management with [name] our diabetes educator

## 2019-11-30 LAB — LIPID PANEL
Chol/HDL Ratio: 2.7 ratio (ref 0.0–4.4)
Cholesterol, Total: 167 mg/dL (ref 100–199)
HDL: 62 mg/dL (ref >39)
LDL Chol Calc (NIH): 91 mg/dL (ref 0–99)
Triglycerides: 75 mg/dL (ref 0–149)
VLDL Cholesterol Cal: 14 mg/dL (ref 5–40)

## 2019-11-30 MED FILL — AZELASTINE HCL 0.05 % SOLN: 0.05 | 90 days supply | Qty: 18 | Fill #0

## 2019-12-01 ENCOUNTER — Other Ambulatory Visit: Payer: Self-pay | Admitting: Internal Medicine

## 2019-12-01 MED FILL — ROSUVASTATIN CALCIUM 5 MG T: 5 | 30 days supply | Qty: 30 | Fill #2

## 2019-12-03 ENCOUNTER — Encounter: Payer: Self-pay | Admitting: Internal Medicine

## 2019-12-03 ENCOUNTER — Other Ambulatory Visit: Payer: Self-pay | Admitting: *Deleted

## 2019-12-03 MED FILL — UNIFINE PENTIPS 32GX5/32: 32G X 4 MM | 50 days supply | Qty: 200 | Fill #2

## 2019-12-03 MED FILL — metFORMIN HCL 1000 MG TABS: 1000 | 90 days supply | Qty: 180 | Fill #0

## 2019-12-03 MED FILL — UNIFINE PENTIPS 32GX5/32": 32G X 4 MM | 50 days supply | Qty: 200 | Fill #2

## 2019-12-04 MED ORDER — INSULIN PEN NEEDLE 32G X 4 MM MISC: 1 refills | Status: AC

## 2019-12-07 ENCOUNTER — Encounter: Payer: Medicare Other | Admitting: Dietician

## 2019-12-07 ENCOUNTER — Other Ambulatory Visit: Payer: Self-pay

## 2019-12-07 ENCOUNTER — Encounter: Payer: Self-pay | Admitting: Dietician

## 2019-12-07 ENCOUNTER — Encounter: Payer: Self-pay | Admitting: Internal Medicine

## 2019-12-07 ENCOUNTER — Other Ambulatory Visit: Payer: Self-pay | Admitting: Internal Medicine

## 2019-12-07 ENCOUNTER — Ambulatory Visit (INDEPENDENT_AMBULATORY_CARE_PROVIDER_SITE_OTHER): Payer: Medicare Other | Admitting: Internal Medicine

## 2019-12-07 ENCOUNTER — Ambulatory Visit (INDEPENDENT_AMBULATORY_CARE_PROVIDER_SITE_OTHER): Payer: Medicare Other | Admitting: Dietician

## 2019-12-07 DIAGNOSIS — E1165 Type 2 diabetes mellitus with hyperglycemia: Secondary | ICD-10-CM

## 2019-12-07 DIAGNOSIS — E11319 Type 2 diabetes mellitus with unspecified diabetic retinopathy without macular edema: Secondary | ICD-10-CM

## 2019-12-07 DIAGNOSIS — I200 Unstable angina: Secondary | ICD-10-CM | POA: Diagnosis not present

## 2019-12-07 DIAGNOSIS — E1140 Type 2 diabetes mellitus with diabetic neuropathy, unspecified: Secondary | ICD-10-CM

## 2019-12-07 DIAGNOSIS — IMO0002 Reserved for concepts with insufficient information to code with codable children: Secondary | ICD-10-CM

## 2019-12-07 DIAGNOSIS — Z794 Long term (current) use of insulin: Secondary | ICD-10-CM

## 2019-12-07 DIAGNOSIS — Z713 Dietary counseling and surveillance: Secondary | ICD-10-CM

## 2019-12-07 MED ORDER — NOVOLOG FLEXPEN 100 UNIT/ML ~~LOC~~ SOPN: PEN_INJECTOR | SUBCUTANEOUS | 5 refills | Status: AC

## 2019-12-07 MED FILL — NOVOLOG FLEXPEN SYRINGE: 100 | 53 days supply | Qty: 15 | Fill #0

## 2019-12-07 NOTE — Progress Notes (Signed)
   CC: DMII  HPI:[name] is a 69 y.o. female who presents for evaluation of DMII. Please see individual problem based A/P for details.  Depression, PHQ-9: Based on the patients    Office Visit from 12/07/2019 in [place]  PHQ-9 Total Score  6     score we have decided to continue to monitor.  Past Medical History:  Diagnosis Date  . Arthritis    "knees, lower back" (08/18/2017)  . 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 S/p lumpectomy and radiation.  Marland Kitchen CAD (coronary artery disease) 11/07   cath 11/11 :  3V CADsee report   . Cataract    small both eyes  . Chronic lower back pain   . Chronic pain    MR 08/07/10 :  Spondylosis L4-5 with B foraminal narrowing and L4 nerve root enchroachment B.  . Chronic venous insufficiency 2012   Has had full W/U incl ECHO, LFT's, Cr, Umicro, TSH, BNP. Symptomatic treatment.  . Colonic polyp 1995   Colonoscopies 1994, 2000, and 02/02/2011. Last had 9 polyps - Tubular adenoma and tubulovillous was the pathology results without high grade dysplasia  . Diabetes mellitus type II    Insulin dependent. Has worked with [name] R previously.  . Diastolic CHF, chronic ([place])    NL EF by echo 01/2012, Gr I dd  . Essential hypertension    Requires 5 drug therapy and still difficult to control.  Marland Kitchen GERD (gastroesophageal reflux disease)   . Heart murmur   . History of radiation therapy 10/02/18- 11/09/18   Left chest wall SCV, Left chest wall boost, Right chest wall SCV, right chest boost all received 28 fractions for a total dose of 50.04 Gy  . Hyperlipidemia    On daily statin  . Iron deficiency anemia    Ferritin was 12 in 2012. on FeSo4. Has had colon and EGD 02/02/11 that showed mild gastritis and colon polyps.  . Obesity    Morbid. HAs worked with [name] T previously.  . OSA (obstructive sleep apnea) 02/16/2007   02/2007 : Moderate AHI 35.6, O2 sat decreased to 65%, CPAP  titration to 16 with AHI of 3.6. 02/2014 : Rare respiratory events with sleep disturbance, within normal limits. AHI 0.4 per hour      . OSA on CPAP   . Personal history of chemotherapy   . Personal history of radiation therapy    "to my left breast"  . Refusal of blood transfusions as patient is Jehovah's Witness   . Seasonal allergies    "grass pollen"   Review of Systems:  ROS negative except as per HPI.  Physical Exam: Vitals:   12/07/19 1036 12/07/19 1037  BP:  123/60  Pulse:  72  Temp:  98.4 F (36.9 C)  TempSrc:  Oral  SpO2:  100%  Weight: 204 lb 6.4 oz (92.7 kg)   Height: 5\' 1"  (1.549 m)    Filed Weights   12/07/19 1036  Weight: 204 lb 6.4 oz (92.7 kg)   General: A/O x4, in no acute distress, afebrile, nondiaphoretic HEENT: PEERL, EMO intact Cardio: RRR, no mrg's  Pulmonary: CTA bilaterally, no wheezing or crackles  Psych: Appropriate affect, not depressed in appearance, engages well  Assessment & Plan:   See Encounters Tab for problem based charting.  Patient discussed with Dr. Lynnae January

## 2019-12-07 NOTE — Assessment & Plan Note (Signed)
  DMII: [name] wore the CGM for 9 days. The average reading was 172, % time in target was 57, % time below target was 0, and % time above target was 43. Intervention will be to increase her Novolog to 10U at lunch and dinner while keeping her breakfast dose at [place]. She will also need to decrease the amount of dried fruit that she eats and consume more fresh fruits as substitutes. The patient will be scheduled to see [name] and [facility] for a final appointment.  Hgb A1c 8.7 % up from 8.1% on her prior visit. The patient denied polyuria above baseline, polydipsia, headache, fatigue, confusion, nausea, vomiting or diaphoresis. Lipid Panel     Component Value Date/Time   CHOL 167 11/29/2019 1108   TRIG 75 11/29/2019 1108   HDL 62 11/29/2019 1108   CHOLHDL 2.7 11/29/2019 1108   CHOLHDL 3.2 06/11/2019 0036   VLDL 7 06/11/2019 0036   LDLCALC 91 11/29/2019 1108   LDLDIRECT 153 (H) 07/13/2011 1055   LABVLDL 14 11/29/2019 1108  Lipid panel appears improve with statin therapy Renal function is stable this of diversion 17 2020 with serum creatinine 0.95. Consider GLP-1 versus SGLT2 at next visit given her cardiac disease.  Plan: Continue Toujeo 35U daily Contniue Novolog 8U with breakfast but increase to 10U with lunch and dinner Continue Metformin 1000mg  BID Continue rosuvastatin 5 mg daily Return in one week

## 2019-12-07 NOTE — Progress Notes (Signed)
Internal Medicine Clinic Attending  Case discussed with Dr. Harbrecht at the time of the visit.  We reviewed the resident's history and exam and pertinent patient test results.  I agree with the assessment, diagnosis, and plan of care documented in the resident's note.  I personally reviewed the CGM data, the resident's interpretation, and agree with the intervention. 

## 2019-12-07 NOTE — Progress Notes (Signed)
Time in: 10:00am  Time Out: 10:30am  A: RD and Intern met with pt to discuss week one of two, [place]. Pt had detailed food log for each day and compared food log with BG data.  D: Self monitoring deficit (NB-1.4) related to food and nutrition related knowledge deficit concerning self monitoring as evidenced by food journal dietary recall and DM dx associated with self monitoring.   I: RD and intern discussed ways to reduce the hyperglycemic events that cause quick spikes in BG as increasing water intake, physical activity, portion control, and timing bolus insulin doses 15-20 minutes prior to food intake. We also identified high carb dietary choices with pt and how these foods impact BG.   M/E: dietary intake through food diary, CGM data, physical activity    Thea Gist Dietetic Intern 03.05.2021

## 2019-12-07 NOTE — Patient Instructions (Signed)
Please return in 6 days with [place] for Download #2.   Please increase your Novolog meal time insulin to 10U at lunch and with dinner and keep the 8U with breakfast. Please continue your Metformin and Toujeo 35U daily.

## 2019-12-07 NOTE — Progress Notes (Addendum)
Per patient's meter she  is checking  Her blood sugar 2.6 times a day. Range over the past week hs been 109-394 with an average of 202. Will follow up in 1 week. Assess readiness for personal CGM

## 2019-12-14 ENCOUNTER — Encounter: Payer: Self-pay | Admitting: Dietician

## 2019-12-14 ENCOUNTER — Telehealth: Payer: Self-pay | Admitting: Dietician

## 2019-12-14 ENCOUNTER — Encounter: Payer: Self-pay | Admitting: Internal Medicine

## 2019-12-14 ENCOUNTER — Ambulatory Visit (INDEPENDENT_AMBULATORY_CARE_PROVIDER_SITE_OTHER): Payer: Medicare Other | Admitting: Dietician

## 2019-12-14 ENCOUNTER — Ambulatory Visit (INDEPENDENT_AMBULATORY_CARE_PROVIDER_SITE_OTHER): Payer: Medicare Other | Admitting: Internal Medicine

## 2019-12-14 DIAGNOSIS — Z713 Dietary counseling and surveillance: Secondary | ICD-10-CM | POA: Diagnosis not present

## 2019-12-14 DIAGNOSIS — Z794 Long term (current) use of insulin: Secondary | ICD-10-CM | POA: Diagnosis not present

## 2019-12-14 DIAGNOSIS — Z923 Personal history of irradiation: Secondary | ICD-10-CM

## 2019-12-14 DIAGNOSIS — Z7982 Long term (current) use of aspirin: Secondary | ICD-10-CM

## 2019-12-14 DIAGNOSIS — Z79899 Other long term (current) drug therapy: Secondary | ICD-10-CM | POA: Diagnosis not present

## 2019-12-14 DIAGNOSIS — E119 Type 2 diabetes mellitus without complications: Secondary | ICD-10-CM | POA: Diagnosis not present

## 2019-12-14 DIAGNOSIS — IMO0002 Reserved for concepts with insufficient information to code with codable children: Secondary | ICD-10-CM

## 2019-12-14 DIAGNOSIS — E11319 Type 2 diabetes mellitus with unspecified diabetic retinopathy without macular edema: Secondary | ICD-10-CM

## 2019-12-14 DIAGNOSIS — I10 Essential (primary) hypertension: Secondary | ICD-10-CM | POA: Diagnosis not present

## 2019-12-14 DIAGNOSIS — Z853 Personal history of malignant neoplasm of breast: Secondary | ICD-10-CM

## 2019-12-14 DIAGNOSIS — E1165 Type 2 diabetes mellitus with hyperglycemia: Secondary | ICD-10-CM

## 2019-12-14 DIAGNOSIS — C50312 Malignant neoplasm of lower-inner quadrant of left female breast: Secondary | ICD-10-CM

## 2019-12-14 NOTE — Telephone Encounter (Incomplete Revision)
call To [facility] about personal CGM order; they are not in network with [place]  Medicaid. [facility] to see if they can service her.await reply.  ?

## 2019-12-14 NOTE — Patient Instructions (Signed)
FOLLOW-UP INSTRUCTIONS When: in about 3 months For: Routine with your PCP Dr. Lynnae January What to bring: All of your medications  I have not made any changes to your medications today.   Today we discussed your diabetes and need to continue following with the breast cancer doctor.  For your diabetes please continue to take your current medications and to continue the low sugar diet with fresh fruits and not dried fruits as you recommended. We will work on approving the personal continuous glucose monitor through Dr. Lynnae January.   Thank you for your visit to the [facility] today. If you have any questions or concerns please call us at [phone].

## 2019-12-14 NOTE — Progress Notes (Addendum)
   CC: DMII  HPI:[name] is a 69 y.o. female who presents for evaluation of DMII. Please see individual problem based A/P for details.  Past Medical History:  Diagnosis Date  . Arthritis    "knees, lower back" (08/18/2017)  . 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 S/p lumpectomy and radiation.  Marland Kitchen CAD (coronary artery disease) 11/07   cath 11/11 :  3V CADsee report   . Cataract    small both eyes  . Chronic lower back pain   . Chronic pain    MR 08/07/10 :  Spondylosis L4-5 with B foraminal narrowing and L4 nerve root enchroachment B.  . Chronic venous insufficiency 2012   Has had full W/U incl ECHO, LFT's, Cr, Umicro, TSH, BNP. Symptomatic treatment.  . Colonic polyp 1995   Colonoscopies 1994, 2000, and 02/02/2011. Last had 9 polyps - Tubular adenoma and tubulovillous was the pathology results without high grade dysplasia  . Diabetes mellitus type II    Insulin dependent. Has worked with [name] R previously.  . Diastolic CHF, chronic ([place])    NL EF by echo 01/2012, Gr I dd  . Essential hypertension    Requires 5 drug therapy and still difficult to control.  Marland Kitchen GERD (gastroesophageal reflux disease)   . Heart murmur   . History of radiation therapy 10/02/18- 11/09/18   Left chest wall SCV, Left chest wall boost, Right chest wall SCV, right chest boost all received 28 fractions for a total dose of 50.04 Gy  . Hyperlipidemia    On daily statin  . Iron deficiency anemia    Ferritin was 12 in 2012. on FeSo4. Has had colon and EGD 02/02/11 that showed mild gastritis and colon polyps.  . Obesity    Morbid. HAs worked with [name] T previously.  . OSA (obstructive sleep apnea) 02/16/2007   02/2007 : Moderate AHI 35.6, O2 sat decreased to 65%, CPAP titration to 16 with AHI of 3.6. 02/2014 : Rare respiratory events with sleep disturbance, within normal limits. AHI 0.4 per hour      . OSA on CPAP   . Personal history of chemotherapy   .  Personal history of radiation therapy    "to my left breast"  . Refusal of blood transfusions as patient is Jehovah's Witness   . Seasonal allergies    "grass pollen"   Review of Systems:  ROS negative except as per HPI.  Physical Exam: Vitals:   12/14/19 1045  BP: 138/69  Pulse: 74  SpO2: 100%  Weight: 208 lb 8 oz (94.6 kg)   Filed Weights   12/14/19 1045  Weight: 208 lb 8 oz (94.6 kg)   General: A/O x4, in no acute distress, afebrile, nondiaphoretic HEENT: PEERL, EMO intact Neuro: Alert, CNII-XII grossly intact, conversational Psych: Appropriate affect, not depressed in appearance, engages well  Assessment & Plan:   See Encounters Tab for problem based charting.  Patient discussed with Dr. Dareen Piano

## 2019-12-14 NOTE — Assessment & Plan Note (Signed)
DMII: [name] wore the CGM for 15 days. The average reading was 164. Time in target was 67% as compared to 57% prior, time below target was 0% as compared to 0%, and time above target was 31% as compared to 43%. The prior intervention was to increase meal time insulin to 10U for lunch and dinner only and to decrease her intake of dried fruit. Intervention today will be to continue the prior therapy and to apply for a personal CGM. The patient will be scheduled to see her PCP in 1-2 months for a follow-up appointment.   Current A1c 8.7% with a slight increase as compared to the prior A1c at 8.1% The patient denied polyuria, polydipsia, headache, fatigue, confusion, nausea, vomiting or diaphoresis.   Plan: Continue Toujeo 35U daily Continue Novolog 8U w/ breakfast and 10U w/ lunch and dinner Continue Metformin 1000mg  BID Continue rosuvastatin 5mg  daily Repeat A1c at next visit

## 2019-12-14 NOTE — Telephone Encounter (Addendum)
call To [facility] about personal CGM order; they are not in network with [place]  Medicaid.. Call to [facility] to see if they can service her.await reply.

## 2019-12-14 NOTE — Assessment & Plan Note (Signed)
Hypertension: Patient's BP today is 138/69 with a goal of <140/80. The patient endorses adherence to her medication regimen. She denied, chest pain, headache, visual changes, lightheadedness, weakness, dizziness on standing, swelling in the feet or ankles. She stated that at home while resting her BP is typically in the [id number] systolic.   Plan: Continue changes recommended today as her BP is likely only mildly elevated due to walking over to her appointment. We will recheck this at her next appointment and consider therapy changes as indicated.

## 2019-12-14 NOTE — Assessment & Plan Note (Signed)
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 [place] skin anteriro left breast noted in 03/21/18 with Left breast 12 o'clock position: IDC grade 3; 7 o'clock position: IDC grade 3, ER 40%, PR 0%, Ki-67 80%, HER-2 equivocal by FISH ratio 1.17 copy #4.4, IHC 1+ negative, T4d N0 stage IIIc clinical stage.   She is s/p treatment and follows with Oncology. No indication for routine screening today. Will defer screening to oncology at this time.

## 2019-12-14 NOTE — Progress Notes (Signed)
Time In:10:10am Time Out: 10:35am  Assessment: RD and Intern met with pt to discuss 14 day, Freestyle Libre Professional download. Pt provided detailed food and insulin journal.  CGM Results from Download #2  Average is  164  for 15 days Glucose management indicator 7.2 % Time in range (70-180 mg/dL): 67 % (Goal >70%) Time High (181-250 mg/dL) 31 % (Goal < 25%) Time Very High (>250 mg/dl) 2 % (Goal < 5%) Time Low (54-69 mg/dL): 0 % (Goal is <4%) Time Very Low (<54) 0%  (Goal <1%)  Coefficient of variation: 24.2 % (Goal is <36%)  Week 1 Time In Range was 50%, increase of 10%. Week 1 average was 202 mg/dL and Week 2 average was 164 mg/dL.  Diagnosis:Self monitoring deficit (NB-1.4) related to food and nutrition related knowledge deficit concerning self monitoring as evidenced by food journal dietary recall and DM dx associated with self monitoring.    Intervention: RD and Intern discussed ways to improve Time In Range and lower A1c as maintaining hydration status, physical activity, portion control, and timing of bolus insulin/medication adherence. A new goal was set to carry insulin when dining out or away from home in order to bolus for those meals. Another goal was set to get a personal CGM and to return for CGM training.  Monitor/Evaluation: dietary intake, glucometer data, physical activity  Matthew Wilhelm Dietetic Intern 03.12.2021  

## 2019-12-14 NOTE — Progress Notes (Signed)
Internal Medicine Clinic Attending  Case discussed with Dr. Harbrecht at the time of the visit.  We reviewed the resident's history and exam and pertinent patient test results.  I agree with the assessment, diagnosis, and plan of care documented in the resident's note.   

## 2019-12-19 MED FILL — LETROZOLE 2.5 MG TABLET: 2.5 | 90 days supply | Qty: 45 | Fill #1

## 2019-12-21 ENCOUNTER — Telehealth: Payer: Self-pay | Admitting: Internal Medicine

## 2019-12-21 NOTE — Telephone Encounter (Signed)
   [name] DOB: 1951-07-30 MRN: [id number]   RIDER WAIVER AND RELEASE OF LIABILITY  For purposes of improving physical access to our facilities, [facility] is pleased to partner with third parties to provide [facility] patients or other authorized individuals the option of convenient, on-demand ground transportation services (the Technical brewer") through use of the technology service that enables users to request on-demand ground transportation from independent third-party providers.  By opting to use and accept these [employer], I, the undersigned, hereby agree on behalf of myself, and on behalf of any minor child using the [employer] for whom I am the parent or legal guardian, as follows:  1. Government social research officer provided to me are provided by independent third-party transportation providers who are not [employer] or employees and who are unaffiliated with [employer]. 2. [facility] is neither a transportation carrier nor a common or public carrier. 3. [facility] has no control over the quality or safety of the transportation that occurs as a result of the [employer]. 4. [facility] cannot guarantee that any third-party transportation provider will complete any arranged transportation service. 5. [facility] makes no representation, warranty, or guarantee regarding the reliability, timeliness, quality, safety, suitability, or availability of any of the Transport Services or that they will be error free. 6. I fully understand that traveling by vehicle involves risks and dangers of serious bodily injury, including permanent disability, paralysis, and death. I agree, on behalf of myself and on behalf of any minor child using the Transport Services for whom I am the parent or legal guardian, that the entire risk arising out of my use of the [employer] remains solely with me, to the maximum extent permitted under applicable law. 7. The [employer] are provided "as is" and "as available." [facility] disclaims all representations and warranties, express, implied or statutory, not expressly set out in these terms, including the implied warranties of merchantability and fitness for a particular purpose. 8. I hereby waive and release [facility], its agents, employees, officers, directors, representatives, insurers, attorneys, assigns, successors, subsidiaries, and affiliates from any and all past, present, or future claims, demands, liabilities, actions, causes of action, or suits of any kind directly or indirectly arising from acceptance and use of the [employer]. 9. I further waive and release [facility] and its affiliates from all present and future liability and responsibility for any injury or death to persons or damages to property caused by or related to the use of the [employer]. 10. I have read this Waiver and Release of Liability, and I understand the terms used in it and their legal significance. This Waiver is freely and voluntarily given with the understanding that my right (as well as the right of any minor child for whom I am the parent or legal guardian using the [employer]) to legal recourse against [place] in connection with the [employer] is knowingly surrendered in return for use of these services.   I attest that I read the consent document to [name], gave Ms. Mitsch the opportunity to ask questions and answered the questions asked (if any). I affirm that [name] then provided consent for she's participation in this program.     Drucie Ip

## 2019-12-25 ENCOUNTER — Other Ambulatory Visit: Payer: Self-pay

## 2019-12-25 ENCOUNTER — Ambulatory Visit (INDEPENDENT_AMBULATORY_CARE_PROVIDER_SITE_OTHER): Payer: Medicare Other | Admitting: Pharmacist Clinician (PhC)/ Clinical Pharmacy Specialist

## 2019-12-25 DIAGNOSIS — E785 Hyperlipidemia, unspecified: Secondary | ICD-10-CM | POA: Diagnosis not present

## 2019-12-25 DIAGNOSIS — I200 Unstable angina: Secondary | ICD-10-CM

## 2019-12-25 MED ORDER — ROSUVASTATIN CALCIUM 10 MG PO TABS: 10.0000 mg | ORAL_TABLET | Freq: Every day | ORAL | 3 refills | Status: AC

## 2019-12-25 MED FILL — LOSARTAN POTASSIUM 50 MG TA: 50 | 30 days supply | Qty: 30 | Fill #6

## 2019-12-25 MED FILL — ROSUVASTATIN CALCIUM 10 MG: 10 | 90 days supply | Qty: 90 | Fill #0

## 2019-12-25 MED FILL — BRILINTA 90 MG TABLET: 90 | 90 days supply | Qty: 180 | Fill #2

## 2019-12-25 NOTE — Patient Instructions (Signed)
Your Results:             Your most recent labs Goal  Total Cholesterol 167 < 200  Triglycerides 75 < 150  HDL (happy/good cholesterol) 62 > 40  LDL (lousy/bad cholesterol 91 < 70    Medication changes:  Increase rosuvastatin to 10 mg daily.  You can take 2 of the 5 mg tablets each night until they are gone, then pick up the 10 mg prescription  Lab orders:  Go to the lab in about 3 months to repeat your cholesterol labs.  We will also check liver function as well.   Patient Assistance:  The [employer] Well foundation offers assistance to help pay for medication copays.  They will cover copays for all cholesterol lowering meds, including statins, fibrates, omega-3 oils, ezetimibe, Repatha, Praluent, Nexletol, Nexlizet.  The cards are usually good for $2,500 or 12 months, whichever comes first. 1. Go to healthwellfoundation.org 2. Click on "Apply Now" 3. Answer questions as to whom is applying (patient or representative) 4. Your disease fund will be "hypercholesterolemia - Medicare access" 5. They will ask questions about finances and which medications you are taking for cholesterol 6. When you submit, the approval is usually within minutes.  You will need to print the card information from the site 7. You will need to show this information to your pharmacy, they will bill your Medicare Part D plan first -then bill Health Well --for the copay.   You can also call them at [phone], although the hold times can be quite long.   Thank you for choosing [facility]

## 2019-12-25 NOTE — Progress Notes (Signed)
12/26/2019 [name] 1951-02-15 [id number]   HPI:  [name] is a 69 y.o. female patient of Dr Percival Spanish, who presents today for a lipid clinic evaluation.  See pertinent past medical history below.  She was last seen by Dr. Percival Spanish in January and started on rosuvastatin 5 mg daily.  She had previously failed simvastatin, atorvastatin and ezetimibe due to myalgias.  She is in the office to discuss further options for lowering LDL.    She states today that she is doing well on the rosuvastatin 5 mg. No sign of myalgias or other concerns.     Past Medical History: ASCVD NSTEMI 06/2019, DES to proximal LAD, balloon angioplasty to 1st OM; aortic atherosclerosis  chronic venous insufficiency chronic leg pain  DM2 A1c 8.7 (2/21) On Trujeo and Novolog; with retinopathy and neuropathy  hypertension Controlled at 130/62 on   OSA Has CPAP  Morbid obesity 94.6 kg  BMI 39.4    Current Medications: rosuvastatin 5 mg  Cholesterol Goals: LDL < 70   Intolerant/previously tried: atorvastatin 40 and 80 mg - myalgias; ezetimibe, simvastatin  Family history: mother lung cancer -died in her 64's  Father with DM, HTN, smoker died at 10; 2 brothers deceased (one post [place] health issues) other tobacco and alcohol, 1 sister (breast cancer);  2 daughters, still healthy  Diet: changed diet, now makes veggie or sea moss somoothine with herbs - twice daily, regular small meals with mostly veggies; protein mostly thru chicken and fish; added lentils and chicpeas, pintos,  Weight right at 200 lb now, has lost some since changing diet  Exercise:  Walks daily, also does range of motion exercise tid; sit up/push ups daily  Labs: 2/21  TC 167, G 75, HDL 62, LDL 91   Current Outpatient Medications  Medication Sig Dispense Refill  . ACCU-CHEK FASTCLIX LANCETS MISC Check blood sugar 3 times a day (Patient taking differently: 1 each by Other route 3 (three) times daily. ) 102 each 5  . amLODipine  (NORVASC) 10 MG tablet TAKE ONE (1) TABLET BY MOUTH EVERY DAY (Patient taking differently: Take 10 mg by mouth daily. ) 90 tablet 3  . aspirin EC 81 MG tablet Take 81 mg by mouth daily.    Marland Kitchen BRILINTA 90 MG TABS tablet Take 1 tablet by mouth 2 (two) times daily.    . Coenzyme Q10 (CO Q 10) 100 MG CAPS Take 100 mg by mouth daily.     . Continuous Blood Gluc Receiver (FREESTYLE LIBRE 2 READER) DEVI 1 each by Does not apply route 6 (six) times daily. 1 each 1  . Continuous Blood Gluc Sensor (FREESTYLE LIBRE 2 SENSOR) MISC 1 each by Does not apply route 6 (six) times daily. 7 each 3  . furosemide (LASIX) 20 MG tablet TAKE 1 TABLET (20 MG TOTAL) BY MOUTH AS NEEDED FOR EDEMA. (Patient taking differently: Take 20 mg by mouth daily as needed for edema. ) 90 tablet 3  . gabapentin (NEURONTIN) 300 MG capsule Take 1 capsule (300 mg total) by mouth 3 (three) times daily. (Patient taking differently: Take 300 mg by mouth 3 (three) times daily as needed. ) 270 capsule 1  . glucose blood (ACCU-CHEK GUIDE) test strip USE TO CHECK BLOOD SUGARS THREE TIMES DAILY 300 each 2  . insulin aspart (NOVOLOG FLEXPEN) 100 UNIT/ML FlexPen Please take 8U in the morning before breakfast and take 10U at lunch and with dinner. 15 mL 5  . Insulin Pen Needle 32G X  4 MM MISC Use to inject insulin 4 times daily under the skin. DIAG CODE E11.319. INSULIN DEPENDENT 380 each 1  . isosorbide mononitrate (IMDUR) 120 MG 24 hr tablet Take 1 tablet (120 mg total) by mouth 2 (two) times daily. 60 tablet 6  . letrozole (FEMARA) 2.5 MG tablet Take 0.5 tablets (1.25 mg total) by mouth daily. 90 tablet 3  . losartan (COZAAR) 50 MG tablet Take 1 tablet (50 mg total) by mouth daily. 90 tablet 3  . metFORMIN (GLUCOPHAGE) 1000 MG tablet TAKE 1 TABLET (1,000 MG TOTAL) BY MOUTH 2 (TWO) TIMES DAILY WITH A MEAL. 180 tablet 3  . metoprolol succinate (TOPROL-XL) 100 MG 24 hr tablet TAKE ONE (1) TABLET BY MOUTH EVERY DAY (Patient taking differently: Take 100 mg  by mouth daily. ) 90 tablet 3  . Multiple Vitamins-Minerals (CENTRUM SILVER PO) Take 1 tablet by mouth daily.    . nitroGLYCERIN (NITROSTAT) 0.4 MG SL tablet PLACE ONE TABLET UNDER THE TONGUE EVERY 5 MINUTES AS NEEDED FOR CHEST PAIN (Patient taking differently: Place 0.4 mg under the tongue every 5 (five) minutes as needed for chest pain. ) 25 tablet 7  . Olopatadine HCl (PATADAY) 0.2 % SOLN Place 1 drop into both eyes daily as needed (for allergies).     Marland Kitchen omeprazole (PRILOSEC) 40 MG capsule Take 1 capsule (40 mg total) by mouth daily. (Patient taking differently: Take 40 mg by mouth daily as needed (for acid reflux). ) 30 capsule 5  . pyridoxine (B-6) 100 MG tablet Take 100 mg by mouth daily.    . RESTASIS 0.05 % ophthalmic emulsion 1 drop 2 (two) times daily.    Marland Kitchen spironolactone (ALDACTONE) 25 MG tablet Take 2 tablets (50 mg total) by mouth daily. (Patient taking differently: Take 25 mg by mouth daily. ) 180 tablet 3  . TOUJEO SOLOSTAR 300 UNIT/ML SOPN INJECT 35 UNITS INTO THE SKIN DAILY (Patient taking differently: Inject 35 Units into the skin daily. ) 9 mL 11  . vitamin B-12 (CYANOCOBALAMIN) 1000 MCG tablet Take 1,000 mcg by mouth daily.    . rosuvastatin (CRESTOR) 10 MG tablet Take 1 tablet (10 mg total) by mouth daily. 90 tablet 3   No current facility-administered medications for this visit.    Allergies  Allergen Reactions  . Blood-Group Specific Substance Other (See Comments)    NO BLOOD - JEHOVAH'S WITNESS  . Lisinopril Other (See Comments)    Chronic cough secondary to ACE  . Oxycodone-Acetaminophen Hives, Itching and Other (See Comments)    flushing  . Percocet [Oxycodone-Acetaminophen] Hives  . Victoza [Liraglutide] Other (See Comments)    Made her feel "out of it"  . Atorvastatin Other (See Comments)    myalgias    Past Medical History:  Diagnosis Date  . Arthritis    "knees, lower back" (08/18/2017)  . 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 S/p lumpectomy and radiation.  Marland Kitchen CAD (coronary artery disease) 11/07   cath 11/11 :  3V CADsee report   . Cataract    small both eyes  . Chronic lower back pain   . Chronic pain    MR 08/07/10 :  Spondylosis L4-5 with B foraminal narrowing and L4 nerve root enchroachment B.  . Chronic venous insufficiency 2012   Has had full W/U incl ECHO, LFT's, Cr, Umicro, TSH, BNP. Symptomatic treatment.  . Colonic polyp 1995   Colonoscopies 1994, 2000, and 02/02/2011. Last  had 9 polyps - Tubular adenoma and tubulovillous was the pathology results without high grade dysplasia  . Diabetes mellitus type II    Insulin dependent. Has worked with [name] R previously.  . Diastolic CHF, chronic ([place])    NL EF by echo 01/2012, Gr I dd  . Essential hypertension    Requires 5 drug therapy and still difficult to control.  Marland Kitchen GERD (gastroesophageal reflux disease)   . Heart murmur   . History of radiation therapy 10/02/18- 11/09/18   Left chest wall SCV, Left chest wall boost, Right chest wall SCV, right chest boost all received 28 fractions for a total dose of 50.04 Gy  . Hyperlipidemia    On daily statin  . Iron deficiency anemia    Ferritin was 12 in 2012. on FeSo4. Has had colon and EGD 02/02/11 that showed mild gastritis and colon polyps.  . Obesity    Morbid. HAs worked with [name] T previously.  . OSA (obstructive sleep apnea) 02/16/2007   02/2007 : Moderate AHI 35.6, O2 sat decreased to 65%, CPAP titration to 16 with AHI of 3.6. 02/2014 : Rare respiratory events with sleep disturbance, within normal limits. AHI 0.4 per hour      . OSA on CPAP   . Personal history of chemotherapy   . Personal history of radiation therapy    "to my left breast"  . Refusal of blood transfusions as patient is Jehovah's Witness   . Seasonal allergies    "grass pollen"    Last menstrual period 07/12/1984.   Hyperlipidemia Patient with hyperlipidemia currently not at goal, but tolerating  rosuvastatin 5 mg daily.  Reviewed options for increasing dose, adding PSCK-9 inhibitor or bempedoic acid.  Discussed mechanisms of action, side effects, dosing and answered all questions.  Patient is willing to start with just increasing rosuvastatin.  Will go to 10 mg daily.  She is aware to contact the office should she develop any signs of myalgia, otherwise will have her repeat labs in 3 months to determine if at goal (had 35% drop in LDL with just the 5 mg dose).     Tommy Medal PharmD CPP [facility] [address] [place], [place] [address] [phone]

## 2019-12-26 ENCOUNTER — Encounter: Payer: Self-pay | Admitting: Pharmacist Clinician (PhC)/ Clinical Pharmacy Specialist

## 2019-12-26 NOTE — Telephone Encounter (Signed)
Order faxed to [facility]: [name] notified she may hear from them. she asked using advanced [facility] on [address]. Appointment schedule to be trained next Wednesday.

## 2019-12-26 NOTE — Assessment & Plan Note (Signed)
Patient with hyperlipidemia currently not at goal, but tolerating rosuvastatin 5 mg daily.  Reviewed options for increasing dose, adding PSCK-9 inhibitor or bempedoic acid.  Discussed mechanisms of action, side effects, dosing and answered all questions.  Patient is willing to start with just increasing rosuvastatin.  Will go to 10 mg daily.  She is aware to contact the office should she develop any signs of myalgia, otherwise will have her repeat labs in 3 months to determine if at goal (had 35% drop in LDL with just the 5 mg dose).

## 2019-12-27 MED FILL — ISOSORBIDE MONONITRATE ER 1: 120 | 30 days supply | Qty: 60 | Fill #0

## 2019-12-31 MED FILL — ROSUVASTATIN CALCIUM 5 MG T: 5 | 30 days supply | Qty: 30 | Fill #3

## 2020-01-01 ENCOUNTER — Inpatient Hospital Stay (HOSPITAL_COMMUNITY)
Admission: EM | Admit: 2020-01-01 | Discharge: 2020-02-02 | DRG: 208 | Disposition: E | Payer: Medicare Other | Attending: Pulmonary Disease | Admitting: Pulmonary Disease

## 2020-01-01 ENCOUNTER — Other Ambulatory Visit: Payer: Self-pay

## 2020-01-01 ENCOUNTER — Emergency Department (HOSPITAL_COMMUNITY): Payer: Medicare Other

## 2020-01-01 ENCOUNTER — Inpatient Hospital Stay (HOSPITAL_COMMUNITY): Payer: Medicare Other

## 2020-01-01 ENCOUNTER — Encounter (HOSPITAL_COMMUNITY): Payer: Self-pay | Admitting: *Deleted

## 2020-01-01 DIAGNOSIS — J90 Pleural effusion, not elsewhere classified: Secondary | ICD-10-CM | POA: Diagnosis not present

## 2020-01-01 DIAGNOSIS — J690 Pneumonitis due to inhalation of food and vomit: Secondary | ICD-10-CM | POA: Diagnosis not present

## 2020-01-01 DIAGNOSIS — E1165 Type 2 diabetes mellitus with hyperglycemia: Secondary | ICD-10-CM | POA: Diagnosis present

## 2020-01-01 DIAGNOSIS — Z9911 Dependence on respirator [ventilator] status: Secondary | ICD-10-CM

## 2020-01-01 DIAGNOSIS — I50814 Right heart failure due to left heart failure: Secondary | ICD-10-CM | POA: Diagnosis not present

## 2020-01-01 DIAGNOSIS — R4182 Altered mental status, unspecified: Secondary | ICD-10-CM | POA: Diagnosis not present

## 2020-01-01 DIAGNOSIS — E119 Type 2 diabetes mellitus without complications: Secondary | ICD-10-CM | POA: Diagnosis not present

## 2020-01-01 DIAGNOSIS — K219 Gastro-esophageal reflux disease without esophagitis: Secondary | ICD-10-CM | POA: Diagnosis present

## 2020-01-01 DIAGNOSIS — E161 Other hypoglycemia: Secondary | ICD-10-CM | POA: Diagnosis not present

## 2020-01-01 DIAGNOSIS — D62 Acute posthemorrhagic anemia: Secondary | ICD-10-CM | POA: Diagnosis not present

## 2020-01-01 DIAGNOSIS — R0602 Shortness of breath: Secondary | ICD-10-CM | POA: Diagnosis not present

## 2020-01-01 DIAGNOSIS — J156 Pneumonia due to other aerobic Gram-negative bacteria: Secondary | ICD-10-CM | POA: Diagnosis not present

## 2020-01-01 DIAGNOSIS — I214 Non-ST elevation (NSTEMI) myocardial infarction: Secondary | ICD-10-CM | POA: Diagnosis present

## 2020-01-01 DIAGNOSIS — I5043 Acute on chronic combined systolic (congestive) and diastolic (congestive) heart failure: Secondary | ICD-10-CM | POA: Diagnosis present

## 2020-01-01 DIAGNOSIS — Z66 Do not resuscitate: Secondary | ICD-10-CM | POA: Diagnosis not present

## 2020-01-01 DIAGNOSIS — I462 Cardiac arrest due to underlying cardiac condition: Secondary | ICD-10-CM | POA: Diagnosis present

## 2020-01-01 DIAGNOSIS — Z8674 Personal history of sudden cardiac arrest: Secondary | ICD-10-CM

## 2020-01-01 DIAGNOSIS — I252 Old myocardial infarction: Secondary | ICD-10-CM | POA: Diagnosis not present

## 2020-01-01 DIAGNOSIS — G931 Anoxic brain damage, not elsewhere classified: Secondary | ICD-10-CM | POA: Diagnosis not present

## 2020-01-01 DIAGNOSIS — Z20822 Contact with and (suspected) exposure to covid-19: Secondary | ICD-10-CM | POA: Diagnosis present

## 2020-01-01 DIAGNOSIS — J9622 Acute and chronic respiratory failure with hypercapnia: Secondary | ICD-10-CM | POA: Diagnosis present

## 2020-01-01 DIAGNOSIS — Z452 Encounter for adjustment and management of vascular access device: Secondary | ICD-10-CM

## 2020-01-01 DIAGNOSIS — I5033 Acute on chronic diastolic (congestive) heart failure: Secondary | ICD-10-CM | POA: Diagnosis not present

## 2020-01-01 DIAGNOSIS — Z7982 Long term (current) use of aspirin: Secondary | ICD-10-CM

## 2020-01-01 DIAGNOSIS — E874 Mixed disorder of acid-base balance: Secondary | ICD-10-CM | POA: Diagnosis present

## 2020-01-01 DIAGNOSIS — Z803 Family history of malignant neoplasm of breast: Secondary | ICD-10-CM

## 2020-01-01 DIAGNOSIS — Z91048 Other nonmedicinal substance allergy status: Secondary | ICD-10-CM

## 2020-01-01 DIAGNOSIS — J9621 Acute and chronic respiratory failure with hypoxia: Secondary | ICD-10-CM | POA: Diagnosis not present

## 2020-01-01 DIAGNOSIS — I472 Ventricular tachycardia: Secondary | ICD-10-CM | POA: Diagnosis present

## 2020-01-01 DIAGNOSIS — Z955 Presence of coronary angioplasty implant and graft: Secondary | ICD-10-CM

## 2020-01-01 DIAGNOSIS — I25119 Atherosclerotic heart disease of native coronary artery with unspecified angina pectoris: Secondary | ICD-10-CM | POA: Diagnosis present

## 2020-01-01 DIAGNOSIS — Z853 Personal history of malignant neoplasm of breast: Secondary | ICD-10-CM

## 2020-01-01 DIAGNOSIS — Z885 Allergy status to narcotic agent status: Secondary | ICD-10-CM

## 2020-01-01 DIAGNOSIS — I340 Nonrheumatic mitral (valve) insufficiency: Secondary | ICD-10-CM | POA: Diagnosis not present

## 2020-01-01 DIAGNOSIS — D7582 Heparin induced thrombocytopenia (HIT): Secondary | ICD-10-CM | POA: Diagnosis not present

## 2020-01-01 DIAGNOSIS — I25118 Atherosclerotic heart disease of native coronary artery with other forms of angina pectoris: Secondary | ICD-10-CM

## 2020-01-01 DIAGNOSIS — E785 Hyperlipidemia, unspecified: Secondary | ICD-10-CM | POA: Diagnosis present

## 2020-01-01 DIAGNOSIS — R069 Unspecified abnormalities of breathing: Secondary | ICD-10-CM | POA: Diagnosis not present

## 2020-01-01 DIAGNOSIS — T45515A Adverse effect of anticoagulants, initial encounter: Secondary | ICD-10-CM | POA: Diagnosis not present

## 2020-01-01 DIAGNOSIS — C50312 Malignant neoplasm of lower-inner quadrant of left female breast: Secondary | ICD-10-CM

## 2020-01-01 DIAGNOSIS — E669 Obesity, unspecified: Secondary | ICD-10-CM | POA: Diagnosis not present

## 2020-01-01 DIAGNOSIS — R570 Cardiogenic shock: Secondary | ICD-10-CM | POA: Diagnosis present

## 2020-01-01 DIAGNOSIS — I509 Heart failure, unspecified: Secondary | ICD-10-CM

## 2020-01-01 DIAGNOSIS — Z888 Allergy status to other drugs, medicaments and biological substances status: Secondary | ICD-10-CM

## 2020-01-01 DIAGNOSIS — G4733 Obstructive sleep apnea (adult) (pediatric): Secondary | ICD-10-CM | POA: Diagnosis present

## 2020-01-01 DIAGNOSIS — R918 Other nonspecific abnormal finding of lung field: Secondary | ICD-10-CM | POA: Diagnosis not present

## 2020-01-01 DIAGNOSIS — Z170 Estrogen receptor positive status [ER+]: Secondary | ICD-10-CM

## 2020-01-01 DIAGNOSIS — Z7902 Long term (current) use of antithrombotics/antiplatelets: Secondary | ICD-10-CM

## 2020-01-01 DIAGNOSIS — E11319 Type 2 diabetes mellitus with unspecified diabetic retinopathy without macular edema: Secondary | ICD-10-CM | POA: Diagnosis present

## 2020-01-01 DIAGNOSIS — K922 Gastrointestinal hemorrhage, unspecified: Secondary | ICD-10-CM | POA: Diagnosis not present

## 2020-01-01 DIAGNOSIS — Z9013 Acquired absence of bilateral breasts and nipples: Secondary | ICD-10-CM

## 2020-01-01 DIAGNOSIS — Z515 Encounter for palliative care: Secondary | ICD-10-CM | POA: Diagnosis not present

## 2020-01-01 DIAGNOSIS — IMO0002 Reserved for concepts with insufficient information to code with codable children: Secondary | ICD-10-CM | POA: Diagnosis present

## 2020-01-01 DIAGNOSIS — R000 Tachycardia, unspecified: Secondary | ICD-10-CM

## 2020-01-01 DIAGNOSIS — I491 Atrial premature depolarization: Secondary | ICD-10-CM | POA: Diagnosis not present

## 2020-01-01 DIAGNOSIS — Z8249 Family history of ischemic heart disease and other diseases of the circulatory system: Secondary | ICD-10-CM

## 2020-01-01 DIAGNOSIS — J9601 Acute respiratory failure with hypoxia: Secondary | ICD-10-CM | POA: Diagnosis not present

## 2020-01-01 DIAGNOSIS — Z531 Procedure and treatment not carried out because of patient's decision for reasons of belief and group pressure: Secondary | ICD-10-CM | POA: Diagnosis not present

## 2020-01-01 DIAGNOSIS — I5023 Acute on chronic systolic (congestive) heart failure: Secondary | ICD-10-CM

## 2020-01-01 DIAGNOSIS — K7200 Acute and subacute hepatic failure without coma: Secondary | ICD-10-CM | POA: Diagnosis not present

## 2020-01-01 DIAGNOSIS — R05 Cough: Secondary | ICD-10-CM | POA: Diagnosis not present

## 2020-01-01 DIAGNOSIS — R0902 Hypoxemia: Secondary | ICD-10-CM | POA: Diagnosis not present

## 2020-01-01 DIAGNOSIS — I110 Hypertensive heart disease with heart failure: Secondary | ICD-10-CM | POA: Diagnosis present

## 2020-01-01 DIAGNOSIS — I5032 Chronic diastolic (congestive) heart failure: Secondary | ICD-10-CM | POA: Diagnosis present

## 2020-01-01 DIAGNOSIS — I469 Cardiac arrest, cause unspecified: Secondary | ICD-10-CM | POA: Diagnosis not present

## 2020-01-01 DIAGNOSIS — R9431 Abnormal electrocardiogram [ECG] [EKG]: Secondary | ICD-10-CM | POA: Diagnosis present

## 2020-01-01 DIAGNOSIS — Z801 Family history of malignant neoplasm of trachea, bronchus and lung: Secondary | ICD-10-CM

## 2020-01-01 DIAGNOSIS — Z4682 Encounter for fitting and adjustment of non-vascular catheter: Secondary | ICD-10-CM | POA: Diagnosis not present

## 2020-01-01 DIAGNOSIS — I2510 Atherosclerotic heart disease of native coronary artery without angina pectoris: Secondary | ICD-10-CM | POA: Diagnosis present

## 2020-01-01 DIAGNOSIS — I4901 Ventricular fibrillation: Secondary | ICD-10-CM | POA: Diagnosis present

## 2020-01-01 DIAGNOSIS — N179 Acute kidney failure, unspecified: Secondary | ICD-10-CM | POA: Diagnosis present

## 2020-01-01 DIAGNOSIS — Z833 Family history of diabetes mellitus: Secondary | ICD-10-CM

## 2020-01-01 DIAGNOSIS — Z789 Other specified health status: Secondary | ICD-10-CM

## 2020-01-01 DIAGNOSIS — D509 Iron deficiency anemia, unspecified: Secondary | ICD-10-CM | POA: Diagnosis present

## 2020-01-01 DIAGNOSIS — E162 Hypoglycemia, unspecified: Secondary | ICD-10-CM | POA: Diagnosis not present

## 2020-01-01 DIAGNOSIS — K319 Disease of stomach and duodenum, unspecified: Secondary | ICD-10-CM | POA: Diagnosis present

## 2020-01-01 DIAGNOSIS — Z923 Personal history of irradiation: Secondary | ICD-10-CM

## 2020-01-01 DIAGNOSIS — Z978 Presence of other specified devices: Secondary | ICD-10-CM

## 2020-01-01 DIAGNOSIS — Z9221 Personal history of antineoplastic chemotherapy: Secondary | ICD-10-CM

## 2020-01-01 DIAGNOSIS — K2961 Other gastritis with bleeding: Secondary | ICD-10-CM | POA: Diagnosis present

## 2020-01-01 DIAGNOSIS — Z794 Long term (current) use of insulin: Secondary | ICD-10-CM

## 2020-01-01 DIAGNOSIS — I255 Ischemic cardiomyopathy: Secondary | ICD-10-CM | POA: Diagnosis present

## 2020-01-01 DIAGNOSIS — Z79818 Long term (current) use of other agents affecting estrogen receptors and estrogen levels: Secondary | ICD-10-CM

## 2020-01-01 DIAGNOSIS — J9811 Atelectasis: Secondary | ICD-10-CM | POA: Diagnosis not present

## 2020-01-01 DIAGNOSIS — Z6838 Body mass index (BMI) 38.0-38.9, adult: Secondary | ICD-10-CM

## 2020-01-01 DIAGNOSIS — J9600 Acute respiratory failure, unspecified whether with hypoxia or hypercapnia: Secondary | ICD-10-CM | POA: Diagnosis not present

## 2020-01-01 DIAGNOSIS — Z79899 Other long term (current) drug therapy: Secondary | ICD-10-CM

## 2020-01-01 DIAGNOSIS — Z841 Family history of disorders of kidney and ureter: Secondary | ICD-10-CM

## 2020-01-01 DIAGNOSIS — Z8719 Personal history of other diseases of the digestive system: Secondary | ICD-10-CM

## 2020-01-01 LAB — LIPID PANEL
Cholesterol: 162 mg/dL (ref 0–200)
HDL: 55 mg/dL (ref >40)
LDL Cholesterol: 99 mg/dL (ref 0–99)
Total CHOL/HDL Ratio: 2.9 RATIO
Triglycerides: 40 mg/dL (ref <150)
VLDL: 8 mg/dL (ref 0–40)

## 2020-01-01 LAB — MRSA PCR SCREENING: MRSA by PCR: NEGATIVE

## 2020-01-01 LAB — POCT I-STAT 7, (LYTES, BLD GAS, ICA,H+H)
Acid-base deficit: 4 mmol/L — ABNORMAL HIGH (ref 0.0–2.0)
Acid-base deficit: 16 mmol/L — ABNORMAL HIGH (ref 0.0–2.0)
Bicarbonate: 20.9 mmol/L (ref 20.0–28.0)
Bicarbonate: 14.1 mmol/L — ABNORMAL LOW (ref 20.0–28.0)
Calcium, Ion: 1.2 mmol/L (ref 1.15–1.40)
Calcium, Ion: 1.11 mmol/L — ABNORMAL LOW (ref 1.15–1.40)
HCT: 36 % (ref 36.0–46.0)
HCT: 43 % (ref 36.0–46.0)
Hemoglobin: 12.2 g/dL (ref 12.0–15.0)
Hemoglobin: 14.6 g/dL (ref 12.0–15.0)
O2 Saturation: 92 %
O2 Saturation: 73 %
Patient temperature: 99.4
Patient temperature: 99.4
Potassium: 4.4 mmol/L (ref 3.5–5.1)
Potassium: 4.9 mmol/L (ref 3.5–5.1)
Sodium: 136 mmol/L (ref 135–145)
Sodium: 135 mmol/L (ref 135–145)
TCO2: 22 mmol/L (ref 22–32)
TCO2: 16 mmol/L — ABNORMAL LOW (ref 22–32)
pCO2 arterial: 37.8 mmHg (ref 32.0–48.0)
pCO2 arterial: 52 mmHg — ABNORMAL HIGH (ref 32.0–48.0)
pH, Arterial: 7.352 (ref 7.350–7.450)
pH, Arterial: 7.045 — CL (ref 7.350–7.450)
pO2, Arterial: 67 mmHg — ABNORMAL LOW (ref 83.0–108.0)
pO2, Arterial: 57 mmHg — ABNORMAL LOW (ref 83.0–108.0)

## 2020-01-01 LAB — COMPREHENSIVE METABOLIC PANEL
ALT: 26 U/L (ref 0–44)
AST: 43 U/L — ABNORMAL HIGH (ref 15–41)
Albumin: 3.5 g/dL (ref 3.5–5.0)
Alkaline Phosphatase: 68 U/L (ref 38–126)
Anion gap: 13 (ref 5–15)
BUN: 29 mg/dL — ABNORMAL HIGH (ref 8–23)
CO2: 23 mmol/L (ref 22–32)
Calcium: 9.2 mg/dL (ref 8.9–10.3)
Chloride: 102 mmol/L (ref 98–111)
Creatinine, Ser: 1.43 mg/dL — ABNORMAL HIGH (ref 0.44–1.00)
GFR calc Af Amer: 44 mL/min — ABNORMAL LOW (ref >60)
GFR calc non Af Amer: 38 mL/min — ABNORMAL LOW (ref >60)
Glucose, Bld: 245 mg/dL — ABNORMAL HIGH (ref 70–99)
Potassium: 4.1 mmol/L (ref 3.5–5.1)
Sodium: 138 mmol/L (ref 135–145)
Total Bilirubin: 0.3 mg/dL (ref 0.3–1.2)
Total Protein: 7.7 g/dL (ref 6.5–8.1)

## 2020-01-01 LAB — CBC WITH DIFFERENTIAL/PLATELET
Abs Immature Granulocytes: 0.02 10*3/uL (ref 0.00–0.07)
Basophils Absolute: 0 10*3/uL (ref 0.0–0.1)
Basophils Relative: 0 %
Eosinophils Absolute: 0.1 10*3/uL (ref 0.0–0.5)
Eosinophils Relative: 1 %
HCT: 37.1 % (ref 36.0–46.0)
Hemoglobin: 11.5 g/dL — ABNORMAL LOW (ref 12.0–15.0)
Immature Granulocytes: 0 %
Lymphocytes Relative: 21 %
Lymphs Abs: 1.6 10*3/uL (ref 0.7–4.0)
MCH: 29.4 pg (ref 26.0–34.0)
MCHC: 31 g/dL (ref 30.0–36.0)
MCV: 94.9 fL (ref 80.0–100.0)
Monocytes Absolute: 0.5 10*3/uL (ref 0.1–1.0)
Monocytes Relative: 7 %
Neutro Abs: 5.5 10*3/uL (ref 1.7–7.7)
Neutrophils Relative %: 71 %
Platelets: 220 10*3/uL (ref 150–400)
RBC: 3.91 MIL/uL (ref 3.87–5.11)
RDW: 15.7 % — ABNORMAL HIGH (ref 11.5–15.5)
WBC: 7.7 10*3/uL (ref 4.0–10.5)
nRBC: 0.3 % — ABNORMAL HIGH (ref 0.0–0.2)

## 2020-01-01 LAB — LACTIC ACID, PLASMA
Lactic Acid, Venous: 3.8 mmol/L (ref 0.5–1.9)
Lactic Acid, Venous: 7.9 mmol/L (ref 0.5–1.9)
Lactic Acid, Venous: 5.2 mmol/L (ref 0.5–1.9)

## 2020-01-01 LAB — POCT I-STAT EG7
Bicarbonate: 24.8 mmol/L (ref 20.0–28.0)
Calcium, Ion: 1.16 mmol/L (ref 1.15–1.40)
HCT: 28 % — ABNORMAL LOW (ref 36.0–46.0)
Hemoglobin: 9.5 g/dL — ABNORMAL LOW (ref 12.0–15.0)
O2 Saturation: 47 %
Potassium: 4 mmol/L (ref 3.5–5.1)
Sodium: 139 mmol/L (ref 135–145)
TCO2: 26 mmol/L (ref 22–32)
pCO2, Ven: 39.3 mmHg — ABNORMAL LOW (ref 44.0–60.0)
pH, Ven: 7.408 (ref 7.250–7.430)
pO2, Ven: 25 mmHg — CL (ref 32.0–45.0)

## 2020-01-01 LAB — CBG MONITORING, ED
Glucose-Capillary: 345 mg/dL — ABNORMAL HIGH (ref 70–99)
Glucose-Capillary: 415 mg/dL — ABNORMAL HIGH (ref 70–99)
Glucose-Capillary: 412 mg/dL — ABNORMAL HIGH (ref 70–99)

## 2020-01-01 LAB — CBC
HCT: 35.4 % — ABNORMAL LOW (ref 36.0–46.0)
Hemoglobin: 10.6 g/dL — ABNORMAL LOW (ref 12.0–15.0)
MCH: 29.3 pg (ref 26.0–34.0)
MCHC: 29.9 g/dL — ABNORMAL LOW (ref 30.0–36.0)
MCV: 97.8 fL (ref 80.0–100.0)
Platelets: 257 10*3/uL (ref 150–400)
RBC: 3.62 MIL/uL — ABNORMAL LOW (ref 3.87–5.11)
RDW: 15.6 % — ABNORMAL HIGH (ref 11.5–15.5)
WBC: 16.4 10*3/uL — ABNORMAL HIGH (ref 4.0–10.5)
nRBC: 0.1 % (ref 0.0–0.2)

## 2020-01-01 LAB — GLUCOSE, CAPILLARY: Glucose-Capillary: 388 mg/dL — ABNORMAL HIGH (ref 70–99)

## 2020-01-01 LAB — COOXEMETRY PANEL
Carboxyhemoglobin: 0.8 % (ref 0.5–1.5)
Methemoglobin: 1.3 % (ref 0.0–1.5)
O2 Saturation: 61.8 %
Total hemoglobin: 11.2 g/dL — ABNORMAL LOW (ref 12.0–16.0)

## 2020-01-01 LAB — RESPIRATORY PANEL BY RT PCR (FLU A&B, COVID)
Influenza A by PCR: NEGATIVE
Influenza B by PCR: NEGATIVE
SARS Coronavirus 2 by RT PCR: NEGATIVE

## 2020-01-01 LAB — HEMOGLOBIN A1C
Hgb A1c MFr Bld: 8.6 % — ABNORMAL HIGH (ref 4.8–5.6)
Mean Plasma Glucose: 200.12 mg/dL

## 2020-01-01 LAB — ECHOCARDIOGRAM COMPLETE
Height: 61 in
Weight: 3200 oz

## 2020-01-01 LAB — BRAIN NATRIURETIC PEPTIDE: B Natriuretic Peptide: 1562.2 pg/mL — ABNORMAL HIGH (ref 0.0–100.0)

## 2020-01-01 LAB — POC SARS CORONAVIRUS 2 AG -  ED: SARS Coronavirus 2 Ag: NEGATIVE

## 2020-01-01 LAB — CREATININE, SERUM
Creatinine, Ser: 1.97 mg/dL — ABNORMAL HIGH (ref 0.44–1.00)
GFR calc Af Amer: 30 mL/min — ABNORMAL LOW (ref >60)
GFR calc non Af Amer: 25 mL/min — ABNORMAL LOW (ref >60)

## 2020-01-01 LAB — HEPARIN LEVEL (UNFRACTIONATED): Heparin Unfractionated: 0.98 IU/mL — ABNORMAL HIGH (ref 0.30–0.70)

## 2020-01-01 LAB — TROPONIN I (HIGH SENSITIVITY)
Troponin I (High Sensitivity): 3076 ng/L (ref <18)
Troponin I (High Sensitivity): 3427 ng/L (ref <18)
Troponin I (High Sensitivity): 3446 ng/L (ref <18)
Troponin I (High Sensitivity): 3906 ng/L (ref <18)

## 2020-01-01 MED ORDER — INSULIN ASPART 100 UNIT/ML ~~LOC~~ SOLN
0.0000 [IU] | SUBCUTANEOUS | Status: DC
  Administered 2020-01-01: 18:00:00 11 [IU] via SUBCUTANEOUS

## 2020-01-01 MED ORDER — CHLORHEXIDINE GLUCONATE CLOTH 2 % EX PADS
6.0000 | MEDICATED_PAD | Freq: Every day | CUTANEOUS | Status: DC
  Administered 2020-01-02 – 2020-01-08 (×6): 6 via TOPICAL

## 2020-01-01 MED ORDER — SENNOSIDES 8.8 MG/5ML PO SYRP
5.0000 mL | ORAL_SOLUTION | Freq: Two times a day (BID) | ORAL | Status: DC | PRN
  Filled 2020-01-01 (×2): qty 5

## 2020-01-01 MED ORDER — PROPOFOL 1000 MG/100ML IV EMUL
5.0000 ug/kg/min | INTRAVENOUS | Status: DC
  Administered 2020-01-01: 23:00:00 27.78 ug/kg/min via INTRAVENOUS
  Administered 2020-01-01: 17:00:00 20 ug/kg/min via INTRAVENOUS
  Administered 2020-01-02 (×2): 25 ug/kg/min via INTRAVENOUS
  Administered 2020-01-02: 19:00:00 35 ug/kg/min via INTRAVENOUS
  Administered 2020-01-03: 40 ug/kg/min via INTRAVENOUS
  Administered 2020-01-03: 27 ug/kg/min via INTRAVENOUS
  Filled 2020-01-01 (×6): qty 100

## 2020-01-01 MED ORDER — FUROSEMIDE 10 MG/ML IJ SOLN
80.0000 mg | Freq: Two times a day (BID) | INTRAMUSCULAR | Status: DC
  Administered 2020-01-02 – 2020-01-03 (×3): 80 mg via INTRAVENOUS
  Filled 2020-01-01 (×5): qty 8

## 2020-01-01 MED ORDER — NOREPINEPHRINE 4 MG/250ML-% IV SOLN
0.0000 ug/min | INTRAVENOUS | Status: DC
  Administered 2020-01-01 (×2): 34 ug/min via INTRAVENOUS
  Administered 2020-01-01: 10 ug/min via INTRAVENOUS
  Filled 2020-01-01 (×3): qty 250

## 2020-01-01 MED ORDER — NOREPINEPHRINE 4 MG/250ML-% IV SOLN
INTRAVENOUS | Status: AC
  Administered 2020-01-01: 18:00:00 30 ug/min via INTRAVENOUS
  Filled 2020-01-01: qty 250

## 2020-01-01 MED ORDER — INSULIN REGULAR(HUMAN) IN NACL 100-0.9 UT/100ML-% IV SOLN
INTRAVENOUS | Status: DC
  Administered 2020-01-01: 22:00:00 11.5 [IU]/h via INTRAVENOUS
  Administered 2020-01-02: 05:00:00 13 [IU]/h via INTRAVENOUS
  Filled 2020-01-01 (×4): qty 100

## 2020-01-01 MED ORDER — ROSUVASTATIN CALCIUM 5 MG PO TABS
10.0000 mg | ORAL_TABLET | Freq: Every day | ORAL | Status: DC
  Administered 2020-01-02 – 2020-01-07 (×2): 10 mg via ORAL
  Filled 2020-01-01 (×3): qty 2

## 2020-01-01 MED ORDER — FENTANYL CITRATE (PF) 100 MCG/2ML IJ SOLN
25.0000 ug | INTRAMUSCULAR | Status: AC | PRN
  Administered 2020-01-02 (×3): 25 ug via INTRAVENOUS
  Filled 2020-01-01 (×3): qty 2

## 2020-01-01 MED ORDER — INSULIN ASPART 100 UNIT/ML ~~LOC~~ SOLN: 0.0000 [IU] | Freq: Three times a day (TID) | SUBCUTANEOUS | Status: DC

## 2020-01-01 MED ORDER — FENTANYL CITRATE (PF) 100 MCG/2ML IJ SOLN: 25.0000 ug | Freq: Once | INTRAMUSCULAR | Status: DC

## 2020-01-01 MED ORDER — PIPERACILLIN-TAZOBACTAM 3.375 G IVPB 30 MIN
3.3750 g | Freq: Once | INTRAVENOUS | Status: AC
  Administered 2020-01-01: 13:00:00 3.375 g via INTRAVENOUS
  Filled 2020-01-01: qty 50

## 2020-01-01 MED ORDER — DEXMEDETOMIDINE HCL IN NACL 400 MCG/100ML IV SOLN
0.0000 ug/kg/h | INTRAVENOUS | Status: DC
  Filled 2020-01-01: qty 100

## 2020-01-01 MED ORDER — FUROSEMIDE 10 MG/ML IJ SOLN
60.0000 mg | Freq: Once | INTRAMUSCULAR | Status: AC
  Administered 2020-01-01: 60 mg via INTRAVENOUS
  Filled 2020-01-01: qty 6

## 2020-01-01 MED ORDER — MAGNESIUM SULFATE 2 GM/50ML IV SOLN
2.0000 g | Freq: Once | INTRAVENOUS | Status: AC
  Administered 2020-01-01: 08:00:00 2 g via INTRAVENOUS
  Filled 2020-01-01: qty 50

## 2020-01-01 MED ORDER — SODIUM CHLORIDE 0.9 % IV SOLN: INTRAVENOUS | Status: DC | PRN

## 2020-01-01 MED ORDER — PANTOPRAZOLE SODIUM 40 MG IV SOLR
40.0000 mg | INTRAVENOUS | Status: DC
  Administered 2020-01-02 – 2020-01-03 (×2): 40 mg via INTRAVENOUS
  Filled 2020-01-01 (×3): qty 40

## 2020-01-01 MED ORDER — IPRATROPIUM BROMIDE HFA 17 MCG/ACT IN AERS
2.0000 | INHALATION_SPRAY | Freq: Once | RESPIRATORY_TRACT | Status: AC
  Administered 2020-01-01: 08:00:00 2 via RESPIRATORY_TRACT
  Filled 2020-01-01: qty 12.9

## 2020-01-01 MED ORDER — IOHEXOL 350 MG/ML SOLN
75.0000 mL | Freq: Once | INTRAVENOUS | Status: AC | PRN
  Administered 2020-01-01: 12:00:00 75 mL via INTRAVENOUS

## 2020-01-01 MED ORDER — PROPOFOL 1000 MG/100ML IV EMUL
INTRAVENOUS | Status: AC
  Administered 2020-01-01: 18:00:00 27.8 ug/kg/min via INTRAVENOUS
  Filled 2020-01-01: qty 100

## 2020-01-01 MED ORDER — IOHEXOL 350 MG/ML SOLN: 75.0000 mL | Freq: Once | INTRAVENOUS | Status: DC | PRN

## 2020-01-01 MED ORDER — PERFLUTREN LIPID MICROSPHERE
1.0000 mL | INTRAVENOUS | Status: AC | PRN
  Administered 2020-01-01: 4 mL via INTRAVENOUS
  Filled 2020-01-01: qty 10

## 2020-01-01 MED ORDER — LORAZEPAM 2 MG/ML IJ SOLN
INTRAMUSCULAR | Status: AC
  Filled 2020-01-01: qty 1

## 2020-01-01 MED ORDER — SUCCINYLCHOLINE CHLORIDE 20 MG/ML IJ SOLN
INTRAMUSCULAR | Status: AC | PRN
  Administered 2020-01-01: 100 mg via INTRAVENOUS

## 2020-01-01 MED ORDER — LORAZEPAM 2 MG/ML IJ SOLN
0.5000 mg | Freq: Once | INTRAMUSCULAR | Status: AC
  Administered 2020-01-01: 10:00:00 0.5 mg via INTRAVENOUS
  Filled 2020-01-01: qty 1

## 2020-01-01 MED ORDER — HEPARIN (PORCINE) 25000 UT/250ML-% IV SOLN
1400.0000 [IU]/h | INTRAVENOUS | Status: DC
  Administered 2020-01-01: 15:00:00 1400 [IU]/h via INTRAVENOUS
  Filled 2020-01-01: qty 250

## 2020-01-01 MED ORDER — HEPARIN BOLUS VIA INFUSION
4000.0000 [IU] | Freq: Once | INTRAVENOUS | Status: AC
  Administered 2020-01-01: 15:00:00 4000 [IU] via INTRAVENOUS
  Filled 2020-01-01: qty 4000

## 2020-01-01 MED ORDER — VANCOMYCIN HCL 2000 MG/400ML IV SOLN
2000.0000 mg | Freq: Once | INTRAVENOUS | Status: AC
  Administered 2020-01-01: 14:00:00 2000 mg via INTRAVENOUS
  Filled 2020-01-01: qty 400

## 2020-01-01 MED ORDER — ALBUTEROL SULFATE HFA 108 (90 BASE) MCG/ACT IN AERS
8.0000 | INHALATION_SPRAY | Freq: Once | RESPIRATORY_TRACT | Status: AC
  Administered 2020-01-01: 8 via RESPIRATORY_TRACT
  Filled 2020-01-01: qty 6.7

## 2020-01-01 MED ORDER — ASPIRIN 300 MG RE SUPP
300.0000 mg | Freq: Once | RECTAL | Status: AC
  Administered 2020-01-01: 300 mg via RECTAL
  Filled 2020-01-01: qty 1

## 2020-01-01 MED ORDER — CHLORHEXIDINE GLUCONATE 0.12% ORAL RINSE (MEDLINE KIT)
15.0000 mL | Freq: Two times a day (BID) | OROMUCOSAL | Status: DC
  Administered 2020-01-01 – 2020-01-05 (×8): 15 mL via OROMUCOSAL

## 2020-01-01 MED ORDER — DEXTROSE-NACL 5-0.45 % IV SOLN: INTRAVENOUS | Status: DC

## 2020-01-01 MED ORDER — SODIUM CHLORIDE 0.9 % IV SOLN
INTRAVENOUS | Status: DC
  Administered 2020-01-01: 22:00:00 1000 mL via INTRAVENOUS

## 2020-01-01 MED ORDER — ORAL CARE MOUTH RINSE
15.0000 mL | OROMUCOSAL | Status: DC
  Administered 2020-01-01 – 2020-01-06 (×42): 15 mL via OROMUCOSAL

## 2020-01-01 MED ORDER — FENTANYL CITRATE (PF) 100 MCG/2ML IJ SOLN
25.0000 ug | INTRAMUSCULAR | Status: DC | PRN
  Administered 2020-01-01 – 2020-01-02 (×2): 50 ug via INTRAVENOUS
  Administered 2020-01-03: 100 ug via INTRAVENOUS
  Filled 2020-01-01 (×3): qty 2

## 2020-01-01 MED ORDER — ETOMIDATE 2 MG/ML IV SOLN
INTRAVENOUS | Status: AC | PRN
  Administered 2020-01-01: 20 mg via INTRAVENOUS

## 2020-01-01 MED ORDER — DEXTROSE 50 % IV SOLN: 0.0000 mL | INTRAVENOUS | Status: DC | PRN

## 2020-01-01 MED ORDER — LORAZEPAM 2 MG/ML IJ SOLN
0.5000 mg | Freq: Once | INTRAMUSCULAR | Status: AC
  Administered 2020-01-01: 15:00:00 0.5 mg via INTRAVENOUS

## 2020-01-01 MED ORDER — FENTANYL BOLUS VIA INFUSION
25.0000 ug | INTRAVENOUS | Status: DC | PRN
  Filled 2020-01-01: qty 25

## 2020-01-01 MED ORDER — FUROSEMIDE 10 MG/ML IJ SOLN
80.0000 mg | Freq: Once | INTRAMUSCULAR | Status: AC
  Administered 2020-01-02: 80 mg via INTRAVENOUS
  Filled 2020-01-01: qty 8

## 2020-01-01 MED ORDER — ASPIRIN EC 81 MG PO TBEC
81.0000 mg | DELAYED_RELEASE_TABLET | Freq: Every day | ORAL | Status: DC
  Administered 2020-01-02: 81 mg via ORAL
  Filled 2020-01-01 (×2): qty 1

## 2020-01-01 MED ORDER — FENTANYL 2500MCG IN NS 250ML (10MCG/ML) PREMIX INFUSION
25.0000 ug/h | INTRAVENOUS | Status: DC
  Administered 2020-01-03: 21:00:00 200 ug/h via INTRAVENOUS
  Administered 2020-01-03: 09:00:00 25 ug/h via INTRAVENOUS
  Administered 2020-01-04: 10:00:00 100 ug/h via INTRAVENOUS
  Administered 2020-01-05: 180 ug/h via INTRAVENOUS
  Filled 2020-01-01 (×4): qty 250

## 2020-01-01 MED ORDER — EPINEPHRINE 1 MG/10ML IJ SOSY
PREFILLED_SYRINGE | INTRAMUSCULAR | Status: AC | PRN
  Administered 2020-01-01: 1 mg via INTRAVENOUS

## 2020-01-01 MED ORDER — FUROSEMIDE 10 MG/ML IJ SOLN
20.0000 mg | Freq: Once | INTRAMUSCULAR | Status: AC
  Administered 2020-01-01: 11:00:00 20 mg via INTRAVENOUS
  Filled 2020-01-01: qty 2

## 2020-01-01 MED ORDER — METHYLPREDNISOLONE SODIUM SUCC 125 MG IJ SOLR
125.0000 mg | Freq: Once | INTRAMUSCULAR | Status: AC
  Administered 2020-01-01: 08:00:00 125 mg via INTRAVENOUS
  Filled 2020-01-01: qty 2

## 2020-01-01 NOTE — ED Provider Notes (Signed)
Cardiology came to the bedside to see the patient, the patient has become gradually more obtunded and at this time is almost completely unresponsive on BiPAP, in need of intubation.  This was discussed with the family member at the bedside who agreed that they would like that.  Though the patient denies and declines blood products intubation and mechanical ventilation is supported.  This patient is thought to have an ischemic cardiomyopathy worsening congestive heart failure with now an ejection fraction of 25 to 30%.  On my exam the patient is obtunded, cold, poorly perfused and diaphoretic.  Virtually unresponsive to painful stimuli.  Diffuse bilateral lower extremity edema.  Frothy sputum  During the intubation the patient had a bradycardic PEA arrest, this lasted for less than 5 minutes with brief CPR and 1 dose of epinephrine she regained pulses and is now more stable on the cardiac monitor.  I personally directed CPR as well as intubated the patient.  This patient will need to go to the intensive care unit.  Critical care paged at 4:25 PM  Procedure Name: Intubation Date/Time: 12/08/2019 4:26 PM Performed by: Noemi Chapel, MD Pre-anesthesia Checklist: Patient identified, Patient being monitored, Emergency Drugs available, Timeout performed and Suction available Oxygen Delivery Method: Ambu bag Preoxygenation: Pre-oxygenation with 100% oxygen Induction Type: Rapid sequence Ventilation: Mask ventilation without difficulty Laryngoscope Size: Mac and 4 Grade View: Grade II Tube size: 7.0 mm Number of attempts: 2 Airway Equipment and Method: Stylet Placement Confirmation: ETT inserted through vocal cords under direct vision,  CO2 detector and Breath sounds checked- equal and bilateral Secured at: 23 cm Tube secured with: ETT holder Dental Injury: Teeth and Oropharynx as per pre-operative assessment  Difficulty Due To: Difficulty was unanticipated Comments:      CPR  Date/Time:  12/24/2019 4:27 PM Performed by: Noemi Chapel, MD Authorized by: Noemi Chapel, MD  CPR Procedure Details:      Amount of time prior to administration of ACLS/BLS (minutes):  10   CPR/ACLS performed in the ED: Yes     Duration of CPR (minutes):  5   Outcome: ROSC obtained    CPR performed via ACLS guidelines under my direct supervision.  See RN documentation for details including defibrillator use, medications, doses and timing. Comments:     Return of circulation obtained, I personally performed and directed others to do CPR for 5 minutes.  After 2 rounds of CPR and 1 dose of epinephrine the patient regained pulses.    I discussed the case with critical care at 4:37 PM, they will come to admit the patient.  She is critically ill requiring multiple resuscitative measures including Levophed and propofol.  Currently being ventilated, oxygenation around 90%  Final diagnoses:  Acute on chronic systolic congestive heart failure (HCC)      Noemi Chapel, MD 12/06/2019 [phone]

## 2020-01-01 NOTE — Progress Notes (Signed)
CRITICAL VALUE ALERT  Critical Value:  Troponin 3,906  Date & Time Notied:  12/22/2019, 22:25  Provider Notified: E-link

## 2020-01-01 NOTE — Progress Notes (Signed)
[facility] Progress Note Patient Name: PRINCELLA CRAIGE DOB: 19-Mar-1951 MRN: [id number]   Date of Service  12/22/2019  HPI/Events of Note  LVEF = 25% and CVP = 17.   eICU Interventions  Will order: 1. Lasix 80 mg IV now.      Intervention Category Major Interventions: Other:  Lysle Dingwall 12/10/2019, 11:33 PM

## 2020-01-01 NOTE — H&P (Addendum)
Date: 12/26/2019               Patient Name:  [name] MRN: [id number]  DOB: 09-04-1951 Age / Sex: 69 y.o., female   PCP: Bartholomew Crews, MD         Medical Service: Internal Medicine Teaching Service         Attending Physician: Dr. Deno Etienne, DO    First Contact: Marianna Payment, DO, Marland Kitchen Pager: [facility] [phone])  Second Contact: Eileen Stanford, MD, Obed Pager: OA [phone])       After Hours (After 5p/  First Contact Pager: [phone]  weekends / holidays): Second Contact Pager: [phone]   Chief Complaint: Shortness of breath  History of Present Illness: [name] is a 69 y.o. female with a pertinent PMH of three-vessel coronary disease, HFpEF, diabetes, hypertension, hyperlipidemia, breast cancer status post chemo and radiation, obesity who presented to [facility] with a 4 days history of shortness of breath   She states that she had an episode of chest pain last week that was unrelieved with nitroglycerin. The chest pains occurred Friday, Saturday and Sunday. She has associated left arm pain but denies nausea or vomiting. These episodes were in the setting of cleaning up in her house. She states that she though she was having indigestion with associated diaphoresis. The pain subsided with rest. She began experiencing shortness of breath on Saturday morning. She also endorses lower extremity edema, abdominal bloating and decreased urine output. She takes all her medications as instructed but missed her doses today. She denies fever, sick contacts. She reports of lower back pain  ED Course: In the ED patient became hypoxic eventually coded requiring intubation and CPR.  Patient was resuscitated and transferred to the ICU.   Lab Orders     Respiratory Panel by RT PCR (Flu A&B, Covid) - Nasopharyngeal Swab     CBC with Differential     Comprehensive metabolic panel     Brain natriuretic peptide     Blood gas, arterial     POC SARS Coronavirus 2 Ag-ED - Nasal Swab (BD Veritor Kit)      POCT I-Stat EG7     I-STAT 7, (LYTES, BLD GAS, ICA, H+H)   Meds:  Current Meds  Medication Sig   amLODipine (NORVASC) 10 MG tablet TAKE ONE (1) TABLET BY MOUTH EVERY DAY (Patient taking differently: Take 10 mg by mouth daily. )   aspirin EC 81 MG tablet Take 81 mg by mouth daily.   BRILINTA 90 MG TABS tablet Take 1 tablet by mouth 2 (two) times daily.   Coenzyme Q10 (CO Q 10) 100 MG CAPS Take 100 mg by mouth daily.    furosemide (LASIX) 20 MG tablet TAKE 1 TABLET (20 MG TOTAL) BY MOUTH AS NEEDED FOR EDEMA. (Patient taking differently: Take 20 mg by mouth daily as needed for edema. )   gabapentin (NEURONTIN) 300 MG capsule Take 1 capsule (300 mg total) by mouth 3 (three) times daily. (Patient taking differently: Take 300 mg by mouth 3 (three) times daily as needed. )   guaiFENesin (ROBITUSSIN) 100 MG/5ML SOLN Take 10 mLs by mouth every 4 (four) hours as needed for cough or to loosen phlegm. Uses sugar free   insulin aspart (NOVOLOG FLEXPEN) 100 UNIT/ML FlexPen Please take 8U in the morning before breakfast and take 10U at lunch and with dinner.   isosorbide mononitrate (IMDUR) 120 MG 24 hr tablet Take 1 tablet (120 mg total) by mouth 2 (two)  times daily.   letrozole (FEMARA) 2.5 MG tablet Take 0.5 tablets (1.25 mg total) by mouth daily.   losartan (COZAAR) 50 MG tablet Take 1 tablet (50 mg total) by mouth daily.   metFORMIN (GLUCOPHAGE) 1000 MG tablet TAKE 1 TABLET (1,000 MG TOTAL) BY MOUTH 2 (TWO) TIMES DAILY WITH A MEAL.   metoprolol succinate (TOPROL-XL) 100 MG 24 hr tablet TAKE ONE (1) TABLET BY MOUTH EVERY DAY (Patient taking differently: Take 100 mg by mouth daily. )   Multiple Vitamins-Minerals (CENTRUM SILVER PO) Take 1 tablet by mouth daily.   nitroGLYCERIN (NITROSTAT) 0.4 MG SL tablet PLACE ONE TABLET UNDER THE TONGUE EVERY 5 MINUTES AS NEEDED FOR CHEST PAIN (Patient taking differently: Place 0.4 mg under the tongue every 5 (five) minutes as needed for chest pain. )   Olopatadine HCl  (PATADAY) 0.2 % SOLN Place 1 drop into both eyes daily as needed (for allergies).    omeprazole (PRILOSEC) 40 MG capsule Take 1 capsule (40 mg total) by mouth daily. (Patient taking differently: Take 40 mg by mouth daily as needed (for acid reflux). )   pyridoxine (B-6) 100 MG tablet Take 100 mg by mouth daily.   RESTASIS 0.05 % ophthalmic emulsion Place 1 drop into both eyes 2 (two) times daily as needed (dry eyes).    rosuvastatin (CRESTOR) 10 MG tablet Take 1 tablet (10 mg total) by mouth daily.   spironolactone (ALDACTONE) 25 MG tablet Take 2 tablets (50 mg total) by mouth daily. (Patient taking differently: Take 25 mg by mouth daily. )   TOUJEO SOLOSTAR 300 UNIT/ML SOPN INJECT 35 UNITS INTO THE SKIN DAILY (Patient taking differently: Inject 35 Units into the skin daily. )   vitamin B-12 (CYANOCOBALAMIN) 1000 MCG tablet Take 1,000 mcg by mouth daily.    Social:  Social History   Socioeconomic History   Marital status: Legally Separated    Spouse name: Not on file   Number of children: Not on file   Years of education: 14   Highest education level: Associate degree: academic program  Occupational History   Not on file  Tobacco Use   Smoking status: Never Smoker   Smokeless tobacco: Never Used  Substance and Sexual Activity   Alcohol use: No   Drug use: No   Sexual activity: Not Currently  Other Topics Concern   Not on file  Social History Narrative   Worked at [employer] part-time from [phone], 2009-2011. No longer working 2015.Associates degree in early childhood education. Single, lives by self.   Social Determinants of Health   Financial Resource Strain: Low Risk    Difficulty of Paying Living Expenses: Not hard at all  Food Insecurity: No Food Insecurity   Worried About Charity fundraiser in the Last Year: Never true   Arboriculturist in the Last Year: Never true  Transportation Needs:    Film/video editor (Medical):    Lack of Transportation  (Non-Medical):   Physical Activity: Unknown   Days of Exercise per Week: 2 days   Minutes of Exercise per Session: Not asked  Stress:    Feeling of Stress :   Social Connections:    Frequency of Communication with Friends and Family:    Frequency of Social Gatherings with Friends and Family:    Attends Religious Services:    Active Member of Clubs or Organizations:    Attends Archivist Meetings:    Marital Status:   Intimate Partner Violence:  Fear of Current or Ex-Partner:    Emotionally Abused:    Physically Abused:    Sexually Abused:      Family History:  Family History  Problem Relation Age of Onset   Lung cancer Mother    Diabetes Father    Breast cancer Sister    Pulmonary embolism Brother    Depression Daughter 12   Osteoarthritis Maternal Grandmother    Heart attack Brother 75   Kidney failure Brother        died 11/29/15 2/2 kidney and heart failure   Colon cancer Neg Hx    Colon polyps Neg Hx    Esophageal cancer Neg Hx    Rectal cancer Neg Hx    Stomach cancer Neg Hx      Allergies: Allergies as of 01/02/2020 - Review Complete 12/10/2019  Allergen Reaction Noted   Blood-group specific substance Other (See Comments) 11/14/2018   Lisinopril Other (See Comments) 11/11/2010   Oxycodone-acetaminophen Hives, Itching, and Other (See Comments) 11/11/2010   Percocet [oxycodone-acetaminophen] Hives 06/23/2018   Victoza [liraglutide] Other (See Comments) 03/24/2017   Atorvastatin Other (See Comments) 07/10/2015   Past Medical History:  Diagnosis Date   Arthritis    "knees, lower back" (08/18/2017)   Breast cancer ([place]) 11-28-04   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 S/p lumpectomy and radiation.   CAD (coronary artery disease) 11/07   cath 11/11 :  3V CADsee report    Cataract    small both eyes   Chronic lower back pain    Chronic pain    MR 08/07/10 :  Spondylosis L4-5 with B foraminal narrowing and L4 nerve root  enchroachment B.   Chronic venous insufficiency 11-28-10   Has had full W/U incl ECHO, LFT's, Cr, Umicro, TSH, BNP. Symptomatic treatment.   Colonic polyp 1995   Colonoscopies 1994, 2000, and 02/02/2011. Last had 9 polyps - Tubular adenoma and tubulovillous was the pathology results without high grade dysplasia   Diabetes mellitus type II    Insulin dependent. Has worked with [name] R previously.   Diastolic CHF, chronic (HCC)    NL EF by echo 01/2012, Gr I dd   Essential hypertension    Requires 5 drug therapy and still difficult to control.   GERD (gastroesophageal reflux disease)    Heart murmur    History of radiation therapy 10/02/18- 11/09/18   Left chest wall SCV, Left chest wall boost, Right chest wall SCV, right chest boost all received 28 fractions for a total dose of 50.04 Gy   Hyperlipidemia    On daily statin   Iron deficiency anemia    Ferritin was 12 in November 28, 2010. on FeSo4. Has had colon and EGD 02/02/11 that showed mild gastritis and colon polyps.   Obesity    Morbid. HAs worked with [name] T previously.   OSA (obstructive sleep apnea) 02/16/2007   02/2007 : Moderate AHI 35.6, O2 sat decreased to 65%, CPAP titration to 16 with AHI of 3.6. 02/2014 : Rare respiratory events with sleep disturbance, within normal limits. AHI 0.4 per hour       OSA on CPAP    Personal history of chemotherapy    Personal history of radiation therapy    "to my left breast"   Refusal of blood transfusions as patient is Jehovah's Witness    Seasonal allergies    "grass pollen"     Review of Systems: A complete ROS was negative except as per HPI.  Physical Exam: Blood pressure 120/77, pulse 99, temperature 99.4 F (37.4 C), temperature source Oral, resp. rate (!) 29, height '5\' 1"'$  (1.549 m), weight 90.7 kg, last menstrual period 07/12/1984, SpO2 95 %. Physical Exam  Constitutional: She is oriented to person, place, and time. She appears distressed.  HENT:  Head: Normocephalic and atraumatic.  Eyes: EOM  are normal.  Cardiovascular: Intact distal pulses. Tachycardia present.  Lower extremities were cool to the touch.  Pulmonary/Chest: She is in respiratory distress. She has rales.  Abdominal: She exhibits no distension. There is no abdominal tenderness.  Musculoskeletal:        General: Edema present. Normal range of motion.     Cervical back: Normal range of motion.  Neurological: She is alert and oriented to person, place, and time.  Skin: She is not diaphoretic.     Labs: CBC    Component Value Date/Time   WBC 7.7 12/20/2019 0753   RBC 3.91 12/14/2019 0753   HGB 12.2 12/31/2019 1124   HGB 12.6 05/24/2019 1151   HCT 36.0 12/03/2019 1124   HCT 37.3 05/24/2019 1151   PLT 220 12/26/2019 0753   PLT 209 05/24/2019 1151   MCV 94.9 12/13/2019 0753   MCV 92 05/24/2019 1151   MCH 29.4 12/08/2019 0753   MCHC 31.0 12/25/2019 0753   RDW 15.7 (H) 12/24/2019 0753   RDW 14.7 05/24/2019 1151   LYMPHSABS 1.6 12/15/2019 0753   LYMPHSABS 1.3 05/24/2019 1151   MONOABS 0.5 12/27/2019 0753   EOSABS 0.1 12/08/2019 0753   EOSABS 0.1 05/24/2019 1151   BASOSABS 0.0 12/12/2019 0753   BASOSABS 0.0 05/24/2019 1151     CMP     Component Value Date/Time   NA 136 12/25/2019 1124   NA 140 06/21/2019 1116   K 4.4 12/06/2019 1124   CL 102 12/03/2019 0753   CO2 23 12/16/2019 0753   GLUCOSE 245 (H) 12/26/2019 0753   BUN 29 (H) 12/31/2019 0753   BUN 18 06/21/2019 1116   CREATININE 1.43 (H) 12/12/2019 0753   CREATININE 0.95 10/19/2018 1222   CREATININE 0.80 10/17/2014 1106   CALCIUM 9.2 12/05/2019 0753   PROT 7.7 12/20/2019 0753   PROT 7.0 06/21/2019 1116   ALBUMIN 3.5 12/03/2019 0753   ALBUMIN 4.1 06/21/2019 1116   AST 43 (H) 12/31/2019 0753   AST 13 (L) 10/19/2018 1222   ALT 26 12/13/2019 0753   ALT 12 10/19/2018 1222   ALKPHOS 68 12/25/2019 0753   BILITOT 0.3 12/31/2019 0753   BILITOT 0.3 06/21/2019 1116   BILITOT 0.3 10/19/2018 1222   GFRNONAA 38 (L) 12/31/2019 0753   GFRNONAA >60  10/19/2018 1222   GFRNONAA 79 10/17/2014 1106   GFRAA 44 (L) 12/03/2019 0753   GFRAA >60 10/19/2018 1222   GFRAA >89 10/17/2014 1106   Troponin: 6,659>9,357 BNP: 1,562.2  ABG    Component Value Date/Time   PHART 7.352 12/29/2019 1124   PCO2ART 37.8 12/03/2019 1124   PO2ART 67.0 (L) 12/09/2019 1124   HCO3 20.9 12/24/2019 1124   TCO2 22 12/09/2019 1124   ACIDBASEDEF 4.0 (H) 12/22/2019 1124   O2SAT 92.0 12/15/2019 1124   Imaging: CXR: Cardiomegaly with pulmonary vascular congestion. Mild interstitial edema. Suspect a degree of underlying congestive heart failure. Bibasilar atelectasis. No consolidation  CT Chest:  1. No pulmonary emboli.  2. Extensive bilateral pulmonary infiltrates with small bilateral pleural effusions, right greater than left.  3. Tracheomalacia, new since the prior study.  4. Fluid collection in the  left anterolateral chest wall, probably a postoperative seroma. Aortic Atherosclerosis    EKG: personally reviewed my interpretation is Sinus tachycardia Ventricular premature complex Borderline intraventricular conduction delay Low voltage with right axis deviation Repol abnrm suggests ischemia, lateral leadsBorderline ST elevation, anterior leads No significant change since last tracing  Assessment & Plan by Problem: Principal Problem:   Acute hypoxemic respiratory failure (HCC) Active Problems:   CAD (coronary artery disease)   Morbid obesity ([place])   DM type 2, uncontrolled, with retinopathy ([place])   Decompensated heart failure ([place])   NSTEMI (non-ST elevated myocardial infarction) ([place])   Cardiac arrest ([place])   Acute on chronic systolic congestive heart failure (HCC)   [name] is a 68 y.o. with pertinent PMH of three-vessel coronary disease, HFpEF, diabetes, hypertension, hyperlipidemia, breast cancer status post chemo and radiation, obesity  who presented with chest pain and shortness of breath and admit for acute hypoxic respiratory failure in  the setting of typical cardiac chest pain on hospital day 0  #Acute hypoxic respiratory failure Patient became hypoxic requiring intubation that resulted in PEA arrest requiring 5 minutes minutes of CPR and and 1 dose of epinephrine prior to ROSC.  PCCM was consulted and the patient was transferred to the ICU for further evaluation and management.  #Typical cardiac chest pain #NSTEMI #HFrEF with severely reduced ejection fraction Patient has a significant cardiovascular history and presented with signs and symptoms concerning for ACS.  Recent echo showed severely reduced ejection fraction which is a decline in function from her previous echo in September.  She had significantly elevated changing troponins concerning for NSTEMI.  Patient was started on heparin and cardiology was consulted in the ED.  Patient was transferred to ICU.  Likely need ischemic evaluation but we will defer management to cardiology.  Diet: NPO VTE: Heparin IVF: None,None Code: Full  Prior to Admission Living Arrangement:  Home in [facility] Anticipated Discharge Location: Unknown Barriers to Discharge: Further medical evaluation and management  Dispo: Admit patient to Inpatient with expected length of stay greater than 2 midnights.  Signed: Marianna Payment, MD 12/03/2019, 12:53 PM  Pager: [phone]

## 2020-01-01 NOTE — Progress Notes (Signed)
ANTICOAGULATION CONSULT NOTE - Initial Consult  Pharmacy Consult for heparin Indication: chest pain/ACS  Allergies  Allergen Reactions  . Blood-Group Specific Substance Other (See Comments)    NO BLOOD - JEHOVAH'S WITNESS  . Lisinopril Other (See Comments)    Chronic cough secondary to ACE  . Oxycodone-Acetaminophen Hives, Itching and Other (See Comments)    flushing  . Percocet [Oxycodone-Acetaminophen] Hives  . Victoza [Liraglutide] Other (See Comments)    Made her feel "out of it"  . Atorvastatin Other (See Comments)    myalgias    Patient Measurements: Height: 5\' 1"  (154.9 cm) Weight: 200 lb (90.7 kg) IBW/kg (Calculated) : 47.8 Heparin Dosing Weight: 69kg  Vital Signs: Temp: 99.4 F (37.4 C) (03/30 0750) Temp Source: Oral (03/30 0750) BP: 108/75 (03/30 1345) Pulse Rate: 98 (03/30 1345)  Labs: Recent Labs    12/10/2019 0753 12/06/2019 0753 12/17/2019 0818 12/07/2019 1008 12/10/2019 1124  HGB 11.5*   < > 9.5*  --  12.2  HCT 37.1  --  28.0*  --  36.0  PLT 220  --   --   --   --   CREATININE 1.43*  --   --   --   --   TROPONINIHS 3,076*  --   --  3,427*  --    < > = values in this interval not displayed.    Estimated Creatinine Clearance: 38.6 mL/min (A) (by C-G formula based on SCr of 1.43 mg/dL (H)).   Medical History: Past Medical History:  Diagnosis Date  . Arthritis    "knees, lower back" (08/18/2017)  . 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 S/p lumpectomy and radiation.  Marland Kitchen CAD (coronary artery disease) 11/07   cath 11/11 :  3V CADsee report   . Cataract    small both eyes  . Chronic lower back pain   . Chronic pain    MR 08/07/10 :  Spondylosis L4-5 with B foraminal narrowing and L4 nerve root enchroachment B.  . Chronic venous insufficiency 2012   Has had full W/U incl ECHO, LFT's, Cr, Umicro, TSH, BNP. Symptomatic treatment.  . Colonic polyp 1995   Colonoscopies 1994, 2000, and 02/02/2011. Last had 9  polyps - Tubular adenoma and tubulovillous was the pathology results without high grade dysplasia  . Diabetes mellitus type II    Insulin dependent. Has worked with [name] R previously.  . Diastolic CHF, chronic ([place])    NL EF by echo 01/2012, Gr I dd  . Essential hypertension    Requires 5 drug therapy and still difficult to control.  Marland Kitchen GERD (gastroesophageal reflux disease)   . Heart murmur   . History of radiation therapy 10/02/18- 11/09/18   Left chest wall SCV, Left chest wall boost, Right chest wall SCV, right chest boost all received 28 fractions for a total dose of 50.04 Gy  . Hyperlipidemia    On daily statin  . Iron deficiency anemia    Ferritin was 12 in 2012. on FeSo4. Has had colon and EGD 02/02/11 that showed mild gastritis and colon polyps.  . Obesity    Morbid. HAs worked with [name] T previously.  . OSA (obstructive sleep apnea) 02/16/2007   02/2007 : Moderate AHI 35.6, O2 sat decreased to 65%, CPAP titration to 16 with AHI of 3.6. 02/2014 : Rare respiratory events with sleep disturbance, within normal limits. AHI 0.4 per hour      . OSA on  CPAP   . Personal history of chemotherapy   . Personal history of radiation therapy    "to my left breast"  . Refusal of blood transfusions as patient is Jehovah's Witness   . Seasonal allergies    "grass pollen"    Medications:  Infusions:  . heparin    . vancomycin 2,000 mg (12/16/2019 1345)    Assessment: 8 yof presented to the ED with SOB. Troponin elevated and now starting IV heparin. Hgb is slightly low and platelets are WNL. She is not on anticoagulation PTA.   Goal of Therapy:  Heparin level 0.3-0.7 units/ml Monitor platelets by anticoagulation protocol: Yes   Plan:  Heparin bolus 4000 units IV x 1 Heparin gtt 1400 units/hr Check a 6 hr heparin level Daily heparin level and CBC  Mahki Spikes, Rande Lawman 12/24/2019,1:54 PM

## 2020-01-01 NOTE — Progress Notes (Signed)
Echocardiogram 2D Echocardiogram has been performed.  Oneal Deputy Abram Sax 12/24/2019, 2:17 PM

## 2020-01-01 NOTE — ED Provider Notes (Signed)
[facility] EMERGENCY DEPARTMENT Provider Note   CSN: [id number] Arrival date & time: 12/12/2019  [id number]     History Chief Complaint  Patient presents with  . Shortness of Breath    [name] is a 69 y.o. female.  69 yo F with a chief complaint of shortness of breath.  This been going on for about 4 days.  Patient states that she has had a cough for the distorted recently.  She thinks is due to her neighbors smoking cigarettes and the smoke coming in through the vents in her house.  States that she is allergic to smoke and this is happened to her before.  Denies fevers denies chest pain or pressure denies abdominal pain nausea vomiting or diarrhea.  Denies exposure to anyone sick.  She had significant improvement with breathing treatments by EMS and is requesting a breathing treatment here.  The history is provided by the patient.  Shortness of Breath Severity:  Moderate Onset quality:  Sudden Duration:  4 days Timing:  Constant Progression:  Worsening Chronicity:  New Relieved by:  Nothing Worsened by:  Nothing Ineffective treatments:  None tried Associated symptoms: cough   Associated symptoms: no chest pain, no fever, no headaches, no vomiting and no wheezing        Past Medical History:  Diagnosis Date  . Arthritis    "knees, lower back" (08/18/2017)  . 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 S/p lumpectomy and radiation.  Marland Kitchen CAD (coronary artery disease) 11/07   cath 11/11 :  3V CADsee report   . Cataract    small both eyes  . Chronic lower back pain   . Chronic pain    MR 08/07/10 :  Spondylosis L4-5 with B foraminal narrowing and L4 nerve root enchroachment B.  . Chronic venous insufficiency 2012   Has had full W/U incl ECHO, LFT's, Cr, Umicro, TSH, BNP. Symptomatic treatment.  . Colonic polyp 1995   Colonoscopies 1994, 2000, and 02/02/2011. Last had 9 polyps - Tubular adenoma and  tubulovillous was the pathology results without high grade dysplasia  . Diabetes mellitus type II    Insulin dependent. Has worked with [name] R previously.  . Diastolic CHF, chronic ([place])    NL EF by echo 01/2012, Gr I dd  . Essential hypertension    Requires 5 drug therapy and still difficult to control.  Marland Kitchen GERD (gastroesophageal reflux disease)   . Heart murmur   . History of radiation therapy 10/02/18- 11/09/18   Left chest wall SCV, Left chest wall boost, Right chest wall SCV, right chest boost all received 28 fractions for a total dose of 50.04 Gy  . Hyperlipidemia    On daily statin  . Iron deficiency anemia    Ferritin was 12 in 2012. on FeSo4. Has had colon and EGD 02/02/11 that showed mild gastritis and colon polyps.  . Obesity    Morbid. HAs worked with [name] T previously.  . OSA (obstructive sleep apnea) 02/16/2007   02/2007 : Moderate AHI 35.6, O2 sat decreased to 65%, CPAP titration to 16 with AHI of 3.6. 02/2014 : Rare respiratory events with sleep disturbance, within normal limits. AHI 0.4 per hour      . OSA on CPAP   . Personal history of chemotherapy   . Personal history of radiation therapy    "to my left breast"  . Refusal of blood transfusions as patient is  [employer]   . Seasonal allergies    "grass pollen"    Patient Active Problem List   Diagnosis Date Noted  . Acute hypoxemic respiratory failure ([place]) 12/04/2019  . Decompensated heart failure ([place]) 12/09/2019  . NSTEMI (non-ST elevated myocardial infarction) ([facility] [facility]) 12/13/2019  . Aortic atherosclerosis ([place]) 11/29/2019  . 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 Bilateral lower extremity edema 06/15/2018  . 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 Chronic diastolic heart failure ([place]) 06/15/2018  . Port-A-Cath in place 04/25/2018  . Hypertensive retinopathy 09/09/2017  . Unstable angina ([place]) 08/18/2017  . Elevated TSH 08/15/2015    . Prolonged Q-T interval on ECG 08/14/2015  . Diabetic retinopathy ([place]) 03/26/2015  . Abnormality of gait 01/06/2013  . CTS (carpal tunnel syndrome) 03/09/2012  . Urinary frequency 07/14/2011  . Chronic venous insufficiency   . Routine health maintenance 03/09/2011  . CAD (coronary artery disease)   . Benign essential HTN   . Hyperlipidemia   . Colonic polyp   . Morbid obesity ([place])   . Chronic pain   . Malignant neoplasm of lower-inner quadrant of left breast in female, estrogen receptor positive ([place])   . Iron deficiency anemia   . Seasonal allergies   . OSA (obstructive sleep apnea) 02/16/2007  . DM type 2, uncontrolled, with retinopathy ([place]) 10/04/2004    Past Surgical History:  Procedure Laterality Date  . BREAST LUMPECTOMY Left 2006  . BREAST LUMPECTOMY W/ NEEDLE LOCALIZATION  2006   34 Chemo RX  . CARDIAC CATHETERIZATION N/A 08/15/2015   Procedure: Left Heart Cath and Coronary Angiography;  Surgeon: Troy Sine, MD;  Location: [facility];  Service: Cardiovascular;  Laterality: N/A;  . COLONOSCOPY W/ BIOPSIES AND POLYPECTOMY  1994, 2000, 2012, 26/018  . CORONARY STENT INTERVENTION N/A 06/13/2019   Procedure: CORONARY STENT INTERVENTION;  Surgeon: Burnell Blanks, MD;  Location: [facility];  Service: Cardiovascular;  Laterality: N/A;  . IR IMAGING GUIDED PORT INSERTION  04/17/2018  . IR REMOVAL TUN ACCESS W/ PORT W/O FL MOD SED  11/14/2018  . LEFT HEART CATH AND CORONARY ANGIOGRAPHY N/A 08/19/2017   Procedure: LEFT HEART CATH AND CORONARY ANGIOGRAPHY;  Surgeon: Belva Crome, MD;  Location: [facility];  Service: Cardiovascular;  Laterality: N/A;  . LEFT HEART CATH AND CORONARY ANGIOGRAPHY N/A 06/13/2019   Procedure: LEFT HEART CATH AND CORONARY ANGIOGRAPHY;  Surgeon: Burnell Blanks, MD;  Location: [facility];  Service: Cardiovascular;  Laterality: N/A;  . MASTECTOMY MODIFIED RADICAL Bilateral 07/20/2018  . MASTECTOMY  MODIFIED RADICAL Bilateral 07/20/2018   Procedure: BILATERAL MODIFIED RADICAL MASTECTOMIES;  Surgeon: Jovita Kussmaul, MD;  Location: [facility];  Service: General;  Laterality: Bilateral;  . PORTACATH PLACEMENT N/A 04/14/2018   Procedure: ATTEMPED INSERTION PORT-A-CATH;  Surgeon: Jovita Kussmaul, MD;  Location: [facility];  Service: General;  Laterality: N/A;  . TONSILLECTOMY AND ADENOIDECTOMY  1964  . TOTAL ABDOMINAL HYSTERECTOMY  1985   "I had endometrosis"  . ULTRASOUND GUIDANCE FOR VASCULAR ACCESS  08/19/2017   Procedure: Ultrasound Guidance For Vascular Access;  Surgeon: Belva Crome, MD;  Location: [facility];  Service: Cardiovascular;;     OB History   No obstetric history on file.     Family History  Problem Relation Age of Onset  . Lung cancer Mother   . Diabetes Father   . Breast cancer Sister   .  Pulmonary embolism Brother   . Depression Daughter 75  . Osteoarthritis Maternal Grandmother   . Heart attack Brother 29  . Kidney failure Brother        died 2016-01-25 2/2 kidney and heart failure  . Colon cancer Neg Hx   . Colon polyps Neg Hx   . Esophageal cancer Neg Hx   . Rectal cancer Neg Hx   . Stomach cancer Neg Hx     Social History   Tobacco Use  . Smoking status: Never Smoker  . Smokeless tobacco: Never Used  Substance Use Topics  . Alcohol use: No  . Drug use: No    Home Medications Prior to Admission medications   Medication Sig Start Date End Date Taking? Authorizing Provider  amLODipine (NORVASC) 10 MG tablet TAKE ONE (1) TABLET BY MOUTH EVERY DAY Patient taking differently: Take 10 mg by mouth daily.  12/13/18  Yes Bartholomew Crews, MD  aspirin EC 81 MG tablet Take 81 mg by mouth daily.   Yes [provider]  BRILINTA 90 MG TABS tablet Take 1 tablet by mouth 2 (two) times daily. 09/26/19  Yes [provider]  Coenzyme Q10 (CO Q 10) 100 MG CAPS Take 100 mg by mouth daily.    Yes [provider]  furosemide (LASIX) 20 MG  tablet TAKE 1 TABLET (20 MG TOTAL) BY MOUTH AS NEEDED FOR EDEMA. Patient taking differently: Take 20 mg by mouth daily as needed for edema.  01/17/19 01/17/20 Yes Bartholomew Crews, MD  gabapentin (NEURONTIN) 300 MG capsule Take 1 capsule (300 mg total) by mouth 3 (three) times daily. Patient taking differently: Take 300 mg by mouth 3 (three) times daily as needed.  11/27/19  Yes Nicholas Lose, MD  guaiFENesin (ROBITUSSIN) 100 MG/5ML SOLN Take 10 mLs by mouth every 4 (four) hours as needed for cough or to loosen phlegm. Uses sugar free   Yes [provider]  insulin aspart (NOVOLOG FLEXPEN) 100 UNIT/ML FlexPen Please take 8U in the morning before breakfast and take 10U at lunch and with dinner. 12/07/19  Yes Kathi Ludwig, MD  isosorbide mononitrate (IMDUR) 120 MG 24 hr tablet Take 1 tablet (120 mg total) by mouth 2 (two) times daily. 07/04/19  Yes Lendon Colonel, NP  letrozole [facility]) 2.5 MG tablet Take 0.5 tablets (1.25 mg total) by mouth daily. 10/19/19  Yes Nicholas Lose, MD  losartan (COZAAR) 50 MG tablet Take 1 tablet (50 mg total) by mouth daily. 07/04/19  Yes Lendon Colonel, NP  metFORMIN (GLUCOPHAGE) 1000 MG tablet TAKE 1 TABLET (1,000 MG TOTAL) BY MOUTH 2 (TWO) TIMES DAILY WITH A MEAL. 12/03/19  Yes Bartholomew Crews, MD  metoprolol succinate (TOPROL-XL) 100 MG 24 hr tablet TAKE ONE (1) TABLET BY MOUTH EVERY DAY Patient taking differently: Take 100 mg by mouth daily.  01/17/19  Yes Bartholomew Crews, MD  Multiple Vitamins-Minerals (CENTRUM SILVER PO) Take 1 tablet by mouth daily.   Yes [provider]  nitroGLYCERIN (NITROSTAT) 0.4 MG SL tablet PLACE ONE TABLET UNDER THE TONGUE EVERY 5 MINUTES AS NEEDED FOR CHEST PAIN Patient taking differently: Place 0.4 mg under the tongue every 5 (five) minutes as needed for chest pain.  01/17/19  Yes Minus Breeding, MD  Olopatadine HCl (PATADAY) 0.2 % SOLN Place 1 drop into both eyes daily as needed (for allergies).     Yes [provider]  omeprazole (PRILOSEC) 40 MG capsule Take 1 capsule (40 mg total) by mouth  daily. Patient taking differently: Take 40 mg by mouth daily as needed (for acid reflux).  05/17/18  Yes Tanner, Lyndon Code., PA-C  pyridoxine (B-6) 100 MG tablet Take 100 mg by mouth daily.   Yes [provider]  RESTASIS 0.05 % ophthalmic emulsion Place 1 drop into both eyes 2 (two) times daily as needed (dry eyes).  08/13/19  Yes [provider]  rosuvastatin (CRESTOR) 10 MG tablet Take 1 tablet (10 mg total) by mouth daily. 12/25/19 03/24/20 Yes Minus Breeding, MD  spironolactone (ALDACTONE) 25 MG tablet Take 2 tablets (50 mg total) by mouth daily. Patient taking differently: Take 25 mg by mouth daily.  07/04/19  Yes Lendon Colonel, NP  TOUJEO SOLOSTAR 300 UNIT/ML SOPN INJECT 35 UNITS INTO THE SKIN DAILY Patient taking differently: Inject 35 Units into the skin daily.  04/09/19  Yes Bartholomew Crews, MD  vitamin B-12 (CYANOCOBALAMIN) 1000 MCG tablet Take 1,000 mcg by mouth daily.   Yes [provider]  ACCU-CHEK FASTCLIX LANCETS MISC Check blood sugar 3 times a day Patient taking differently: 1 each by Other route 3 (three) times daily.  10/11/17   Bartholomew Crews, MD  Continuous Blood Gluc Receiver (FREESTYLE LIBRE 2 READER) DEVI 1 each by Does not apply route 6 (six) times daily. 11/29/19   Bartholomew Crews, MD  Continuous Blood Gluc Sensor (FREESTYLE LIBRE 2 SENSOR) MISC 1 each by Does not apply route 6 (six) times daily. 11/29/19   Bartholomew Crews, MD  glucose blood (ACCU-CHEK GUIDE) test strip USE TO CHECK BLOOD SUGARS THREE TIMES DAILY 12/10/19   Bartholomew Crews, MD  Insulin Pen Needle 32G X 4 MM MISC Use to inject insulin 4 times daily under the skin. DIAG CODE E11.319. INSULIN DEPENDENT 12/04/19   Bartholomew Crews, MD    Allergies    Blood-group specific substance, Lisinopril, Oxycodone-acetaminophen, Percocet [oxycodone-acetaminophen],  Victoza [liraglutide], and Atorvastatin  Review of Systems   Review of Systems  Constitutional: Negative for chills and fever.  HENT: Negative for congestion and rhinorrhea.   Eyes: Negative for redness and visual disturbance.  Respiratory: Positive for cough and shortness of breath. Negative for wheezing.   Cardiovascular: Negative for chest pain and palpitations.  Gastrointestinal: Negative for nausea and vomiting.  Genitourinary: Negative for dysuria and urgency.  Musculoskeletal: Negative for arthralgias and myalgias.  Skin: Negative for pallor and wound.  Neurological: Negative for dizziness and headaches.    Physical Exam Updated Vital Signs BP 118/80   Pulse 99   Temp 99.4 F (37.4 C) (Oral)   Resp (!) 26   Ht 5\' 1"  (1.549 m)   Wt 90.7 kg   LMP 07/12/1984   SpO2 93%   BMI 37.79 kg/m   Physical Exam Vitals and nursing note reviewed.  Constitutional:      General: She is not in acute distress.    Appearance: She is well-developed. She is morbidly obese. She is not diaphoretic.  HENT:     Head: Normocephalic and atraumatic.  Eyes:     Pupils: Pupils are equal, round, and reactive to light.  Cardiovascular:     Rate and Rhythm: Normal rate and regular rhythm.     Heart sounds: No murmur. No friction rub. No gallop.   Pulmonary:     Effort: Pulmonary effort is normal.     Breath sounds: Rhonchi present. No wheezing or rales.     Comments: Tachypnea, diffuse rhonchi. Abdominal:     General: There  is no distension.     Palpations: Abdomen is soft.     Tenderness: There is no abdominal tenderness.  Musculoskeletal:        General: No tenderness.     Cervical back: Normal range of motion and neck supple.  Skin:    General: Skin is warm and dry.  Neurological:     Mental Status: She is alert and oriented to person, place, and time.  Psychiatric:        Behavior: Behavior normal.     ED Results / Procedures / Treatments   Labs (all labs ordered are listed,  but only abnormal results are displayed) Labs Reviewed  CBC WITH DIFFERENTIAL/PLATELET - Abnormal; Notable for the following components:      Result Value   Hemoglobin 11.5 (*)    RDW 15.7 (*)    nRBC 0.3 (*)    All other components within normal limits  COMPREHENSIVE METABOLIC PANEL - Abnormal; Notable for the following components:   Glucose, Bld 245 (*)    BUN 29 (*)    Creatinine, Ser 1.43 (*)    AST 43 (*)    GFR calc non Af Amer 38 (*)    GFR calc Af Amer 44 (*)    All other components within normal limits  BRAIN NATRIURETIC PEPTIDE - Abnormal; Notable for the following components:   B Natriuretic Peptide 1,562.2 (*)    All other components within normal limits  POCT I-STAT EG7 - Abnormal; Notable for the following components:   pCO2, Ven 39.3 (*)    pO2, Ven 25.0 (*)    HCT 28.0 (*)    Hemoglobin 9.5 (*)    All other components within normal limits  POCT I-STAT 7, (LYTES, BLD GAS, ICA,H+H) - Abnormal; Notable for the following components:   pO2, Arterial 67.0 (*)    Acid-base deficit 4.0 (*)    All other components within normal limits  TROPONIN I (HIGH SENSITIVITY) - Abnormal; Notable for the following components:   Troponin I (High Sensitivity) 3,076 (*)    All other components within normal limits  TROPONIN I (HIGH SENSITIVITY) - Abnormal; Notable for the following components:   Troponin I (High Sensitivity) 3,427 (*)    All other components within normal limits  RESPIRATORY PANEL BY RT PCR (FLU A&B, COVID)  BLOOD GAS, ARTERIAL  HEPARIN LEVEL (UNFRACTIONATED)  HEMOGLOBIN A1C  LIPID PANEL  LACTIC ACID, PLASMA  LACTIC ACID, PLASMA  POC SARS CORONAVIRUS 2 AG -  ED    EKG EKG Interpretation  Date/Time:  Tuesday January 01 2020 09:44:18 EDT Ventricular Rate:  120 PR Interval:    QRS Duration: 113 QT Interval:  302 QTC Calculation: 427 R Axis:   77 Text Interpretation: Sinus tachycardia Ventricular premature complex Borderline intraventricular conduction delay  Low voltage with right axis deviation Repol abnrm suggests ischemia, lateral leads Borderline ST elevation, anterior leads No significant change since last tracing Confirmed by Deno Etienne [phone]) on 12/06/2019 9:52:12 AM   Radiology CT Angio Chest PE W and/or Wo Contrast  Result Date: 12/19/2019 CLINICAL DATA:  Shortness of breath. EXAM: CT ANGIOGRAPHY CHEST WITH CONTRAST TECHNIQUE: Multidetector CT imaging of the chest was performed using the standard protocol during bolus administration of intravenous contrast. Multiplanar CT image reconstructions and MIPs were obtained to evaluate the vascular anatomy. CONTRAST:  45mL OMNIPAQUE IOHEXOL 350 MG/ML SOLN COMPARISON:  Chest CT dated 04/20/2018 FINDINGS: Cardiovascular: No pulmonary emboli. Aortic atherosclerosis. Extensive coronary artery calcifications. Borderline cardiomegaly. No pericardial  effusion. 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 Thyroid gland esophagus are normal. No hilar or mediastinal. Lungs/Pleura: The patient has extensive bilateral pulmonary infiltrates as well as small bilateral pleural effusions, right greater than left. No pulmonary edema. Upper Abdomen: Normal. Musculoskeletal: There is a fluid collection in the left anterolateral chest wall measuring 9.5 by 3.1 by 7.4 cm. Patient has had bilateral mastectomies. This may represent a postoperative seroma. Was not present on the prior study. No bone abnormality. Review of the MIP images confirms the above findings. IMPRESSION: 1. No pulmonary emboli. 2. Extensive bilateral pulmonary infiltrates with small bilateral pleural effusions, right greater than left. 3. Tracheomalacia, new since the prior study. 4. Fluid collection in the left anterolateral chest wall, probably a postoperative seroma. Aortic Atherosclerosis (ICD10-I70.0). Electronically Signed   By: Lorriane Shire M.D.   On: 12/22/2019 12:27   DG Chest Port 1 View  Result Date:  12/16/2019 CLINICAL DATA:  Shortness of breath and cough EXAM: PORTABLE CHEST 1 VIEW COMPARISON:  June 10, 2019 FINDINGS: There is cardiomegaly with pulmonary venous hypertension. There is mild interstitial edema. There is bibasilar atelectasis. There is no frank consolidation. No adenopathy. Postoperative change noted in each axillary region. There is degenerative change in each shoulder. IMPRESSION: Cardiomegaly with pulmonary vascular congestion. Mild interstitial edema. Suspect a degree of underlying congestive heart failure. Bibasilar atelectasis. No consolidation. Electronically Signed   By: Lowella Grip III M.D.   On: 12/08/2019 08:38   ECHOCARDIOGRAM COMPLETE  Result Date: 12/07/2019    ECHOCARDIOGRAM REPORT   Patient Name:   REKA SNIDOW Date of Exam: 12/04/2019 Medical Rec #:  [id number]       Height:       61.0 in Accession #:    [id number]      Weight:       200.0 lb Date of Birth:  1950/12/12       BSA:          1.889 m Patient Age:    70 years        BP:           106/73 mmHg Patient Gender: F               HR:           98 bpm. Exam Location:  Inpatient Procedure: 2D Echo, Color Doppler, Cardiac Doppler and Intracardiac            Opacification Agent STAT ECHO Indications:    R06.9 DOE  History:        Patient has prior history of Echocardiogram examinations, most                 recent 06/11/2019. CHF, CAD; Risk Factors:Hypertension, Diabetes,                 Dyslipidemia and Sleep Apnea. Breast Cancer.  Sonographer:    Raquel Sarna Senior RDCS Referring Phys: [id number] Jacquel Redditt  Sonographer Comments: Technically difficult due to mastectomy and patient sitting upright on bipap. IMPRESSIONS  1. Left ventricular ejection fraction, by estimation, is 25 to 30%. The left ventricle has severely decreased function. The left ventricle demonstrates global hypokinesis. The left ventricular internal cavity size was mildly dilated. Left ventricular diastolic parameters are indeterminate. Elevated left  ventricular end-diastolic pressure. There is severe akinesis of the left ventricular, entire inferior wall, inferoseptal wall and inferolateral wall.  2. Right ventricular systolic function is mildly reduced. The right ventricular size  is normal. There is moderately elevated pulmonary artery systolic pressure.  3. The mitral valve is abnormal. Mild to moderate mitral valve regurgitation.  4. The aortic valve is tricuspid. Aortic valve regurgitation is trivial. Mild aortic valve sclerosis is present, with no evidence of aortic valve stenosis.  5. The inferior vena cava is dilated in size with <50% respiratory variability, suggesting right atrial pressure of 15 mmHg. Comparison(s): 06/05/2019: LVEF 45-50%, global hypokinesis. FINDINGS  Left Ventricle: Left ventricular ejection fraction, by estimation, is 25 to 30%. The left ventricle has severely decreased function. The left ventricle demonstrates global hypokinesis. Severe akinesis of the left ventricular, entire inferior wall, inferoseptal wall and inferolateral wall. Definity contrast agent was given IV to delineate the left ventricular endocardial borders. The left ventricular internal cavity size was mildly dilated. There is no left ventricular hypertrophy. Left ventricular  diastolic parameters are indeterminate. Elevated left ventricular end-diastolic pressure. Right Ventricle: The right ventricular size is normal. No increase in right ventricular wall thickness. Right ventricular systolic function is mildly reduced. There is moderately elevated pulmonary artery systolic pressure. The tricuspid regurgitant velocity is 2.80 m/s, and with an assumed right atrial pressure of 15 mmHg, the estimated right ventricular systolic pressure is [id number] mmHg. Left Atrium: Left atrial size was normal in size. Right Atrium: Right atrial size was normal in size. Pericardium: There is no evidence of pericardial effusion. Mitral Valve: The mitral valve is abnormal. There is mild  thickening of the mitral valve leaflet(s). Mild to moderate mitral valve regurgitation. Tricuspid Valve: The tricuspid valve is grossly normal. Tricuspid valve regurgitation is trivial. Aortic Valve: The aortic valve is tricuspid. Aortic valve regurgitation is trivial. Mild aortic valve sclerosis is present, with no evidence of aortic valve stenosis. Pulmonic Valve: The pulmonic valve was grossly normal. Pulmonic valve regurgitation is trivial. Aorta: The aortic root and ascending aorta are structurally normal, with no evidence of dilitation. Venous: The inferior vena cava is dilated in size with less than 50% respiratory variability, suggesting right atrial pressure of 15 mmHg. IAS/Shunts: No atrial level shunt detected by color flow Doppler.  LEFT VENTRICLE PLAX 2D LVIDd:         5.80 cm  Diastology LVIDs:         5.40 cm  LV e' lateral:   5.22 cm/s LV PW:         0.80 cm  LV E/e' lateral: 18.0 LV IVS:        0.50 cm  LV e' medial:    4.90 cm/s LVOT diam:     2.00 cm  LV E/e' medial:  19.1 LV SV:         35 LV SV Index:   18 LVOT Area:     3.14 cm  RIGHT VENTRICLE RV S prime:     8.16 cm/s TAPSE (M-mode): 1.0 cm LEFT ATRIUM           Index       RIGHT ATRIUM           Index LA diam:      3.30 cm 1.75 cm/m  RA Area:     11.40 cm LA Vol (A4C): 41.3 ml 21.87 ml/m RA Volume:   23.50 ml  12.44 ml/m  AORTIC VALVE LVOT Vmax:   59.90 cm/s LVOT Vmean:  42.500 cm/s LVOT VTI:    0.110 m  AORTA Ao Root diam: 3.00 cm MITRAL VALVE  TRICUSPID VALVE MV Area (PHT): 4.89 cm    TR Peak grad:   31.4 mmHg MV Decel Time: 155 msec    TR Vmax:        280.00 cm/s MV E velocity: 93.80 cm/s MV A velocity: 24.20 cm/s  SHUNTS MV E/A ratio:  3.88        Systemic VTI:  0.11 m                            Systemic Diam: 2.00 cm Lyman Bishop MD Electronically signed by Lyman Bishop MD Signature Date/Time: 12/22/2019/2:47:35 PM    Final     Procedures Procedures (including critical care time)  Medications Ordered in  ED Medications  vancomycin (VANCOREADY) IVPB 2000 mg/400 mL (2,000 mg Intravenous New Bag/Given 12/17/2019 1345)  heparin ADULT infusion 100 units/mL (25000 units/213mL sodium chloride 0.45%) (1,400 Units/hr Intravenous New Bag/Given 12/08/2019 1430)  perflutren lipid microspheres (DEFINITY) IV suspension (4 mLs Intravenous Given 12/29/2019 1418)  rosuvastatin (CRESTOR) tablet 10 mg (has no administration in time range)  aspirin EC tablet 81 mg (has no administration in time range)  furosemide (LASIX) injection 80 mg (has no administration in time range)  insulin aspart (novoLOG) injection 0-15 Units (has no administration in time range)  LORazepam (ATIVAN) 2 MG/ML injection (has no administration in time range)  albuterol (VENTOLIN HFA) 108 (90 Base) MCG/ACT inhaler 8 puff (8 puffs Inhalation Given 12/24/2019 0820)  ipratropium (ATROVENT HFA) inhaler 2 puff (2 puffs Inhalation Given 12/12/2019 0820)  methylPREDNISolone sodium succinate (SOLU-MEDROL) 125 mg/2 mL injection 125 mg (125 mg Intravenous Given 12/28/2019 0804)  magnesium sulfate IVPB 2 g 50 mL (0 g Intravenous Stopped 12/08/2019 0938)  LORazepam (ATIVAN) injection 0.5 mg (0.5 mg Intravenous Given 01/02/2020 0935)  aspirin suppository 300 mg (300 mg Rectal Given 12/31/2019 1048)  furosemide (LASIX) injection 20 mg (20 mg Intravenous Given 12/22/2019 1048)  iohexol (OMNIPAQUE) 350 MG/ML injection 75 mL (75 mLs Intravenous Contrast Given 12/27/2019 1219)  piperacillin-tazobactam (ZOSYN) IVPB 3.375 g (0 g Intravenous Stopped 12/30/2019 1339)  LORazepam (ATIVAN) injection 0.5 mg (0.5 mg Intravenous Given 12/10/2019 1442)  furosemide (LASIX) injection 60 mg (60 mg Intravenous Given 12/31/2019 1414)  heparin bolus via infusion 4,000 Units (4,000 Units Intravenous Bolus from Bag 12/05/2019 1430)    ED Course  I have reviewed the triage vital signs and the nursing notes.  Pertinent labs & imaging results that were available during my care of the patient were reviewed by me and  considered in my medical decision making (see chart for details).    MDM Rules/Calculators/A&P                      69 yo F with a chief complaint of shortness of breath.  Going on for about 4 days.  Associated with cough.  Found to be significantly hypoxic by EMS requiring nonrebreather in route to the ED.  On nonrebreather with an oxygen saturation in the low 90s.  This is occurring during the novel coronavirus pandemic that must be considered in the differential.  Will obtain a point-of-care test.  Lab work chest x-ray give albuterol and ipratropium as this seemed to have helped her in route with EMS.  Magnesium Solu-Medrol.  Reassess.  Patient continues to have significant difficulty breathing.  She started to get more agitated and has difficulty following commands or wearing her mask.  States that she feels hot and would like  to sit up on the side of the bed.  Asking for something for anxiety.  Given 1/2 mg Ativan and started on BiPAP.  Improvement in work of breathing and increased aeration.   Chest x-ray read by radiology with possible fluid overload.  Not obvious on physical exam due to morbid obesity.  Patient's troponin is elevated at 3000.  Her BNP is 1500.  We will give a small dose of Lasix.  Aspirin.  There is some concern for possible pulmonary embolism, like to obtain a CT scan of the chest if able.  CT scan of chest with some infiltrates bilaterally.  Will cover with antibiotics.  Discussed with internal medicine and cardiology.  Admit.  CRITICAL CARE Performed by: Cecilio Asper   Total critical care time: 80 minutes  Critical care time was exclusive of separately billable procedures and treating other patients.  Critical care was necessary to treat or prevent imminent or life-threatening deterioration.  Critical care was time spent personally by me on the following activities: development of treatment plan with patient and/or surrogate as well as nursing,  discussions with consultants, evaluation of patient's response to treatment, examination of patient, obtaining history from patient or surrogate, ordering and performing treatments and interventions, ordering and review of laboratory studies, ordering and review of radiographic studies, pulse oximetry and re-evaluation of patient's condition.   The patients results and plan were reviewed and discussed.   Any x-rays performed were independently reviewed by myself.   Differential diagnosis were considered with the presenting HPI.  Medications  vancomycin (VANCOREADY) IVPB 2000 mg/400 mL (2,000 mg Intravenous New Bag/Given 12/28/2019 1345)  heparin ADULT infusion 100 units/mL (25000 units/213mL sodium chloride 0.45%) (1,400 Units/hr Intravenous New Bag/Given 12/15/2019 1430)  perflutren lipid microspheres (DEFINITY) IV suspension (4 mLs Intravenous Given 12/20/2019 1418)  rosuvastatin (CRESTOR) tablet 10 mg (has no administration in time range)  aspirin EC tablet 81 mg (has no administration in time range)  furosemide (LASIX) injection 80 mg (has no administration in time range)  insulin aspart (novoLOG) injection 0-15 Units (has no administration in time range)  LORazepam (ATIVAN) 2 MG/ML injection (has no administration in time range)  albuterol (VENTOLIN HFA) 108 (90 Base) MCG/ACT inhaler 8 puff (8 puffs Inhalation Given 12/24/2019 0820)  ipratropium (ATROVENT HFA) inhaler 2 puff (2 puffs Inhalation Given 12/28/2019 0820)  methylPREDNISolone sodium succinate (SOLU-MEDROL) 125 mg/2 mL injection 125 mg (125 mg Intravenous Given 12/18/2019 0804)  magnesium sulfate IVPB 2 g 50 mL (0 g Intravenous Stopped 12/31/2019 0938)  LORazepam (ATIVAN) injection 0.5 mg (0.5 mg Intravenous Given 12/28/2019 0935)  aspirin suppository 300 mg (300 mg Rectal Given 12/13/2019 1048)  furosemide (LASIX) injection 20 mg (20 mg Intravenous Given 12/28/2019 1048)  iohexol (OMNIPAQUE) 350 MG/ML injection 75 mL (75 mLs Intravenous Contrast Given  12/27/2019 1219)  piperacillin-tazobactam (ZOSYN) IVPB 3.375 g (0 g Intravenous Stopped 12/26/2019 1339)  LORazepam (ATIVAN) injection 0.5 mg (0.5 mg Intravenous Given 12/19/2019 1442)  furosemide (LASIX) injection 60 mg (60 mg Intravenous Given 12/11/2019 1414)  heparin bolus via infusion 4,000 Units (4,000 Units Intravenous Bolus from Bag 12/29/2019 1430)    Vitals:   01/02/2020 1445 12/22/2019 1500 12/24/2019 1515 12/03/2019 1530  BP: 112/81 118/80    Pulse: 95 96 (!) 102 99  Resp:      Temp:      TempSrc:      SpO2: 93% 99% 93% 93%  Weight:      Height:  Final diagnoses:  Acute on chronic systolic congestive heart failure ([place])    Admission/ observation were discussed with the admitting physician, patient and/or family and they are comfortable with the plan.   Final Clinical Impression(s) / ED Diagnoses Final diagnoses:  Acute on chronic systolic congestive heart failure [facility])    Rx / DC Orders ED Discharge Orders    None       Deno Etienne, DO 12/08/2019 1538

## 2020-01-01 NOTE — Code Documentation (Addendum)
Patient lost pulses and went bradycardiac. CPR initiated.

## 2020-01-01 NOTE — Code Documentation (Addendum)
Femoral pulse palpated, CPR stopped.

## 2020-01-01 NOTE — H&P (Addendum)
NAME:  [name], MRN:  [id number], DOB:  1950/10/18, LOS: 0 ADMISSION DATE:  12/30/2019, CONSULTATION DATE:  12/04/2019 REFERRING MD: Noemi Chapel MD CHIEF COMPLAINT:  Cardiac arrest   Brief History   Patient is a 69 yr old female with PMHx of three vessel CAD, diabetes, HFpEF, HTN, breast cancer s/p radiation, obesity who presented with four days of dyspnea. Patient admitted with cardiogenic shock in setting of worsening CHF requiring intubation with brief PEA arrest with ROSC with brief CPR and 1 dose epinephrine. PCCM to admit  History of present illness   History obtained via chart review and family at bedside as patient is sedated and mechanically ventilated.  Patient is a 69yr old female with PMHx of three vessel CAD, HFpEF (EF 25-30%), HTN, breast cancer s/p radiation, obesity who presented with four day history of shortness of breath. Patient also noted three day history of chest pain unrelieved by nitroglycerin with associated left arm pain in setting of exertion with associated diaphoresis. Pain subsided with rest. Patient also noted to have lower extremity edema, abdominal bloating and decreased urine output despite medication compliance.   Patient initially found to be significantly hypoxic by EMS requiring NRB en route with improvement of O2 sats to low 90s. Patient improved with breathing treatments with EMS. She continued to have difficulty breathing despite receiving magnesium and solumedrol in the ED. Patient started on BiPAP. CXR with possible fluid overload with elevated BNP and troponins. Patient given small dose of lasix and aspirin. CT chest with bilateral infiltrates concerning for possible PNA, however negative for PE. In the ED, patient became gradually more obtunded and unresponsive on BiPAP requiring intubation. During intubation, patient had brief bradycardic PEA arrest lasting <5 minutes with brief CPR and 1 dose epinephrine with ROSC. Patient admitted to [facility].  Past  Medical History   Past Medical History:  Diagnosis Date  . Arthritis    "knees, lower back" (08/18/2017)  . 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 S/p lumpectomy and radiation.  Marland Kitchen CAD (coronary artery disease) 11/07   cath 11/11 :  3V CADsee report   . Cataract    small both eyes  . Chronic lower back pain   . Chronic pain    MR 08/07/10 :  Spondylosis L4-5 with B foraminal narrowing and L4 nerve root enchroachment B.  . Chronic venous insufficiency 2012   Has had full W/U incl ECHO, LFT's, Cr, Umicro, TSH, BNP. Symptomatic treatment.  . Colonic polyp 1995   Colonoscopies 1994, 2000, and 02/02/2011. Last had 9 polyps - Tubular adenoma and tubulovillous was the pathology results without high grade dysplasia  . Diabetes mellitus type II    Insulin dependent. Has worked with [name] R previously.  . Diastolic CHF, chronic ([place])    NL EF by echo 01/2012, Gr I dd  . Essential hypertension    Requires 5 drug therapy and still difficult to control.  Marland Kitchen GERD (gastroesophageal reflux disease)   . Heart murmur   . History of radiation therapy 10/02/18- 11/09/18   Left chest wall SCV, Left chest wall boost, Right chest wall SCV, right chest boost all received 28 fractions for a total dose of 50.04 Gy  . Hyperlipidemia    On daily statin  . Iron deficiency anemia    Ferritin was 12 in 2012. on FeSo4. Has had colon and EGD 02/02/11 that showed mild gastritis and colon polyps.  . Obesity  Morbid. HAs worked with [name] T previously.  . OSA (obstructive sleep apnea) 02/16/2007   02/2007 : Moderate AHI 35.6, O2 sat decreased to 65%, CPAP titration to 16 with AHI of 3.6. 02/2014 : Rare respiratory events with sleep disturbance, within normal limits. AHI 0.4 per hour      . OSA on CPAP   . Personal history of chemotherapy   . Personal history of radiation therapy    "to my left breast"  . Refusal of blood transfusions as patient is Jehovah's Witness   .  Seasonal allergies    "grass pollen"   Significant Hospital Events   3/30 - admit to ICU s/p cardiac arrest  Consults:  PCCM Cardiology  Procedures:  ETT 3/30>>  Significant Diagnostic Tests:  3/30 CTA Chest PE >> No PE, extensive bilateral pulmonary infiltrates w/bilateral pleural effusions R>L, tracheomalacia, postoperative seroma 3/30 Echo >> LVEF 25-30%, global hypokinesis, elevated LVEDP, severe akinesis of LV, inferior, inferoseptal and inferolateral wall; moderately elevated pulmonary artery systolic pressure; mild to moderate elevated MV regurgitation  Micro Data:  3/30 SARS COVID-19, Influenza A/B >> negative   Antimicrobials:  Vancomycin 3/30 Zosyn 3/30  Interim history/subjective:  Patient admitted with acute hypoxic and hypercarbic respiratory failure in setting of cardiogenic shock  Objective   Blood pressure (!) 148/73, pulse (!) 118, temperature 99.4 F (37.4 C), temperature source Oral, resp. rate (!) 28, height 5\' 1"  (1.549 m), weight 90.7 kg, last menstrual period 07/12/1984, SpO2 93 %.    Vent Mode: PRVC FiO2 (%):  [100 %] 100 % Set Rate:  [20 bmp-28 bmp] 28 bmp Vt Set:  [380 mL] 380 mL PEEP:  [10 cmH20] 10 cmH20   Intake/Output Summary (Last 24 hours) at 12/14/2019 1703 Last data filed at 12/20/2019 1614 Gross per 24 hour  Intake 550 ml  Output --  Net 550 ml   Filed Weights   12/31/2019 0750  Weight: 90.7 kg    Examination: General: critically ill appearing female who appears older than stated age, sedated and mechanically ventilated  HENT: [place]/AT, ET tube in place, PERRL, MM moist Lungs: coarse bilateral rales diffusely Cardiovascular: S1 and S2 present, intact distal pulses, holosystolic murmur at apex Abdomen: distended, soft, nontender, normoactive bowel soundsx4 Extremities: cool to touch, 1+ pitting edema of bilateral lower extremities Neuro: sedated and minimally responsive to painful stimuli, PERRL  Resolved Hospital Problem list      Assessment & Plan:  Acute hypoxic and hypercarbic respiratory failure requiring mechanical ventilation Patient presented with acute hypoxic and hypercarbic respiratory failure requiring intubation after failed BiPAP trial. Initial concerns for CAP given CXR for which patient given one dose of vancomycin and zosyn. However, patient is afebrile and without leukocytosis. CXR more consistent with interstitial edema in setting of congestive heart failure.  - continue full mechanical ventilator support  - PRVC 6-17ml/kg  - VAP protocol - Goal SpO2 >90%  Cardiogenic shock in setting or worsening HFrEF NSTEMI, Cardiac arrest  Hx three-vessel CAD Patient presented with worsening HFrEF, noted to have severely reduced EF of 25-30% (previously 45-50% in September 2020). She has history of severe CAD s/p stent in 06/2019. Patient not good candidate for CABG due to anemia and refusal of blood products as she is Jehovah's witness. On admission, noted to have elevated BNP and elevated troponins. EKG with nonspecific changes.She suffered brief cardiac arrest during intubation.  - Cardiology recommendations appreciated - Vasopressor support for MAP >65 - Patient will need central access and arterial line for  monitoring  - IV heparin gtt - Trending troponins  - Monitoring co-ox - Ischemic evaluation once stabilized  Acute kidney injury: Patient's baseline sCr 0.9-1.0; Noted to have sCr 1.43 in setting of cardiogenic shock  - BMP daily - Avoid nephrotoxic agents - Strict I&O  Acute encephalopathy 2/2 respiratory failure and cardiogenic shock  Patient currently sedated with propofol and fentanyl.  - Continue with sedation with RASS goal -1 to -2   Diabetes mellitus:  Patient's A1c 8.6. She is on metformin at home. - SSI (moderate) - CBG q4h  Hypertension: Patient's home antihypertensives held in setting of cardiogenic shock   Best practice:  Diet: NPO Pain/Anxiety/Delirium protocol (if  indicated): fentanyl and precedex gtt VAP protocol (if indicated): per protocol DVT prophylaxis: heparin subQ + SCDs GI prophylaxis: PPI Glucose control: SSI Mobility: Bed bound Code Status: Full code Family Communication: Family updated at bedside Disposition: ICU  Labs   CBC: Recent Labs  Lab 12/10/2019 0753 12/13/2019 0818 12/30/2019 1124 12/12/2019 1652  WBC 7.7  --   --   --   NEUTROABS 5.5  --   --   --   HGB 11.5* 9.5* 12.2 14.6  HCT 37.1 28.0* 36.0 43.0  MCV 94.9  --   --   --   PLT 220  --   --   --     Basic Metabolic Panel: Recent Labs  Lab 12/18/2019 0753 01/02/2020 0818 12/21/2019 1124 12/31/2019 1652  NA 138 139 136 135  K 4.1 4.0 4.4 4.9  CL 102  --   --   --   CO2 23  --   --   --   GLUCOSE 245*  --   --   --   BUN 29*  --   --   --   CREATININE 1.43*  --   --   --   CALCIUM 9.2  --   --   --    GFR: Estimated Creatinine Clearance: 38.6 mL/min (A) (by C-G formula based on SCr of 1.43 mg/dL (H)). Recent Labs  Lab 12/09/2019 0753 12/12/2019 1520  WBC 7.7  --   LATICACIDVEN  --  3.8*    Liver Function Tests: Recent Labs  Lab 12/04/2019 0753  AST 43*  ALT 26  ALKPHOS 68  BILITOT 0.3  PROT 7.7  ALBUMIN 3.5   No results for input(s): LIPASE, AMYLASE in the last 168 hours. No results for input(s): AMMONIA in the last 168 hours.  ABG    Component Value Date/Time   PHART 7.045 (LL) 12/16/2019 1652   PCO2ART 52.0 (H) 12/04/2019 1652   PO2ART 57.0 (L) 12/03/2019 1652   HCO3 14.1 (L) 12/25/2019 1652   TCO2 16 (L) 12/30/2019 1652   ACIDBASEDEF 16.0 (H) 12/20/2019 1652   O2SAT 73.0 12/06/2019 1652     Coagulation Profile: No results for input(s): INR, PROTIME in the last 168 hours.  Cardiac Enzymes: No results for input(s): CKTOTAL, CKMB, CKMBINDEX, TROPONINI in the last 168 hours.  HbA1C: Hemoglobin A1C  Date/Time Value Ref Range Status  11/29/2019 10:19 AM 8.7 (A) 4.0 - 5.6 % Final  05/31/2019 10:04 AM 8.1 (A) 4.0 - 5.6 % Final   Hgb A1c MFr Bld   Date/Time Value Ref Range Status  12/23/2019 03:20 PM 8.6 (H) 4.8 - 5.6 % Final    Comment:    (NOTE) Pre diabetes:          5.7%-6.4% Diabetes:              >  6.4% Glycemic control for   <7.0% adults with diabetes   07/13/2018 01:25 PM 8.9 (H) 4.8 - 5.6 % Final    Comment:    (NOTE) Pre diabetes:          5.7%-6.4% Diabetes:              >6.4% Glycemic control for   <7.0% adults with diabetes     CBG: No results for input(s): GLUCAP in the last 168 hours.  Review of Systems:   Unable to attain - patient sedated and mechanically ventilated  Past Medical History  She,  has a past medical history of Arthritis, Breast cancer ([place]) (2006), CAD (coronary artery disease) (11/07), Cataract, Chronic lower back pain, Chronic pain, Chronic venous insufficiency (2012), Colonic polyp (1995), Diabetes mellitus type II, Diastolic CHF, chronic ([place]), Essential hypertension, GERD (gastroesophageal reflux disease), Heart murmur, History of radiation therapy (10/02/18- 11/09/18), Hyperlipidemia, Iron deficiency anemia, Obesity, OSA (obstructive sleep apnea) (02/16/2007), OSA on CPAP, Personal history of chemotherapy, Personal history of radiation therapy, Refusal of blood transfusions as patient is Jehovah's Witness, and Seasonal allergies.   Surgical History    Past Surgical History:  Procedure Laterality Date  . BREAST LUMPECTOMY Left 2006  . BREAST LUMPECTOMY W/ NEEDLE LOCALIZATION  2006   34 Chemo RX  . CARDIAC CATHETERIZATION N/A 08/15/2015   Procedure: Left Heart Cath and Coronary Angiography;  Surgeon: Troy Sine, MD;  Location: [facility];  Service: Cardiovascular;  Laterality: N/A;  . COLONOSCOPY W/ BIOPSIES AND POLYPECTOMY  1994, 2000, 2012, 26/018  . CORONARY STENT INTERVENTION N/A 06/13/2019   Procedure: CORONARY STENT INTERVENTION;  Surgeon: Burnell Blanks, MD;  Location: [place] [place] [facility];  Service: Cardiovascular;  Laterality: N/A;  . IR IMAGING GUIDED PORT  INSERTION  04/17/2018  . IR REMOVAL TUN ACCESS W/ PORT W/O FL MOD SED  11/14/2018  . LEFT HEART CATH AND CORONARY ANGIOGRAPHY N/A 08/19/2017   Procedure: LEFT HEART CATH AND CORONARY ANGIOGRAPHY;  Surgeon: Belva Crome, MD;  Location: [facility];  Service: Cardiovascular;  Laterality: N/A;  . LEFT HEART CATH AND CORONARY ANGIOGRAPHY N/A 06/13/2019   Procedure: LEFT HEART CATH AND CORONARY ANGIOGRAPHY;  Surgeon: Burnell Blanks, MD;  Location: [facility];  Service: Cardiovascular;  Laterality: N/A;  . MASTECTOMY MODIFIED RADICAL Bilateral 07/20/2018  . MASTECTOMY MODIFIED RADICAL Bilateral 07/20/2018   Procedure: BILATERAL MODIFIED RADICAL MASTECTOMIES;  Surgeon: Jovita Kussmaul, MD;  Location: [facility];  Service: General;  Laterality: Bilateral;  . PORTACATH PLACEMENT N/A 04/14/2018   Procedure: ATTEMPED INSERTION PORT-A-CATH;  Surgeon: Jovita Kussmaul, MD;  Location: [facility];  Service: General;  Laterality: N/A;  . TONSILLECTOMY AND ADENOIDECTOMY  1964  . TOTAL ABDOMINAL HYSTERECTOMY  1985   "I had endometrosis"  . ULTRASOUND GUIDANCE FOR VASCULAR ACCESS  08/19/2017   Procedure: Ultrasound Guidance For Vascular Access;  Surgeon: Belva Crome, MD;  Location: [facility];  Service: Cardiovascular;;     Social History   reports that she has never smoked. She has never used smokeless tobacco. She reports that she does not drink alcohol or use drugs.   Family History   Her family history includes Breast cancer in her sister; Depression (age of onset: 55) in her daughter; Diabetes in her father; Heart attack (age of onset: 31) in her brother; Kidney failure in her brother; Lung cancer in her mother; Osteoarthritis in her maternal grandmother; Pulmonary embolism in her brother.  There is no history of Colon cancer, Colon polyps, Esophageal cancer, Rectal cancer, or Stomach cancer.   Allergies Allergies  Allergen Reactions  . Blood-Group Specific Substance Other (See  Comments)    NO BLOOD - JEHOVAH'S WITNESS  . Lisinopril Other (See Comments)    Chronic cough secondary to ACE  . Oxycodone-Acetaminophen Hives, Itching and Other (See Comments)    flushing  . Percocet [Oxycodone-Acetaminophen] Hives  . Victoza [Liraglutide] Other (See Comments)    Made her feel "out of it"  . Atorvastatin Other (See Comments)    myalgias     Home Medications  Prior to Admission medications   Medication Sig Start Date End Date Taking? Authorizing Provider  amLODipine (NORVASC) 10 MG tablet TAKE ONE (1) TABLET BY MOUTH EVERY DAY Patient taking differently: Take 10 mg by mouth daily.  12/13/18  Yes Bartholomew Crews, MD  aspirin EC 81 MG tablet Take 81 mg by mouth daily.   Yes [provider]  BRILINTA 90 MG TABS tablet Take 1 tablet by mouth 2 (two) times daily. 09/26/19  Yes [provider]  Coenzyme Q10 (CO Q 10) 100 MG CAPS Take 100 mg by mouth daily.    Yes [provider]  furosemide (LASIX) 20 MG tablet TAKE 1 TABLET (20 MG TOTAL) BY MOUTH AS NEEDED FOR EDEMA. Patient taking differently: Take 20 mg by mouth daily as needed for edema.  01/17/19 01/17/20 Yes Bartholomew Crews, MD  gabapentin (NEURONTIN) 300 MG capsule Take 1 capsule (300 mg total) by mouth 3 (three) times daily. Patient taking differently: Take 300 mg by mouth 3 (three) times daily as needed.  11/27/19  Yes Nicholas Lose, MD  guaiFENesin (ROBITUSSIN) 100 MG/5ML SOLN Take 10 mLs by mouth every 4 (four) hours as needed for cough or to loosen phlegm. Uses sugar free   Yes [provider]  insulin aspart (NOVOLOG FLEXPEN) 100 UNIT/ML FlexPen Please take 8U in the morning before breakfast and take 10U at lunch and with dinner. 12/07/19  Yes Kathi Ludwig, MD  isosorbide mononitrate (IMDUR) 120 MG 24 hr tablet Take 1 tablet (120 mg total) by mouth 2 (two) times daily. 07/04/19  Yes Lendon Colonel, NP  letrozole [facility]) 2.5 MG tablet Take 0.5 tablets (1.25 mg  total) by mouth daily. 10/19/19  Yes Nicholas Lose, MD  losartan (COZAAR) 50 MG tablet Take 1 tablet (50 mg total) by mouth daily. 07/04/19  Yes Lendon Colonel, NP  metFORMIN (GLUCOPHAGE) 1000 MG tablet TAKE 1 TABLET (1,000 MG TOTAL) BY MOUTH 2 (TWO) TIMES DAILY WITH A MEAL. 12/03/19  Yes Bartholomew Crews, MD  metoprolol succinate (TOPROL-XL) 100 MG 24 hr tablet TAKE ONE (1) TABLET BY MOUTH EVERY DAY Patient taking differently: Take 100 mg by mouth daily.  01/17/19  Yes Bartholomew Crews, MD  Multiple Vitamins-Minerals (CENTRUM SILVER PO) Take 1 tablet by mouth daily.   Yes [provider]  nitroGLYCERIN (NITROSTAT) 0.4 MG SL tablet PLACE ONE TABLET UNDER THE TONGUE EVERY 5 MINUTES AS NEEDED FOR CHEST PAIN Patient taking differently: Place 0.4 mg under the tongue every 5 (five) minutes as needed for chest pain.  01/17/19  Yes Minus Breeding, MD  Olopatadine HCl (PATADAY) 0.2 % SOLN Place 1 drop into both eyes daily as needed (for allergies).    Yes [provider]  omeprazole (PRILOSEC) 40 MG capsule Take 1 capsule (40 mg total) by mouth daily. Patient taking differently: Take 40 mg by mouth daily as  needed (for acid reflux).  05/17/18  Yes Tanner, Lyndon Code., PA-C  pyridoxine (B-6) 100 MG tablet Take 100 mg by mouth daily.   Yes [provider]  RESTASIS 0.05 % ophthalmic emulsion Place 1 drop into both eyes 2 (two) times daily as needed (dry eyes).  08/13/19  Yes [provider]  rosuvastatin (CRESTOR) 10 MG tablet Take 1 tablet (10 mg total) by mouth daily. 12/25/19 03/24/20 Yes Minus Breeding, MD  spironolactone (ALDACTONE) 25 MG tablet Take 2 tablets (50 mg total) by mouth daily. Patient taking differently: Take 25 mg by mouth daily.  07/04/19  Yes Lendon Colonel, NP  TOUJEO SOLOSTAR 300 UNIT/ML SOPN INJECT 35 UNITS INTO THE SKIN DAILY Patient taking differently: Inject 35 Units into the skin daily.  04/09/19  Yes Bartholomew Crews, MD  vitamin B-12  (CYANOCOBALAMIN) 1000 MCG tablet Take 1,000 mcg by mouth daily.   Yes [provider]  ACCU-CHEK FASTCLIX LANCETS MISC Check blood sugar 3 times a day Patient taking differently: 1 each by Other route 3 (three) times daily.  10/11/17   Bartholomew Crews, MD  Continuous Blood Gluc Receiver (FREESTYLE LIBRE 2 READER) DEVI 1 each by Does not apply route 6 (six) times daily. 11/29/19   Bartholomew Crews, MD  Continuous Blood Gluc Sensor (FREESTYLE LIBRE 2 SENSOR) MISC 1 each by Does not apply route 6 (six) times daily. 11/29/19   Bartholomew Crews, MD  glucose blood (ACCU-CHEK GUIDE) test strip USE TO CHECK BLOOD SUGARS THREE TIMES DAILY 12/10/19   Bartholomew Crews, MD  Insulin Pen Needle 32G X 4 MM MISC Use to inject insulin 4 times daily under the skin. DIAG CODE E11.319. INSULIN DEPENDENT 12/04/19   Bartholomew Crews, MD     Critical care time: 56 minutes   Harvie Heck, MD Internal Medicine, PGY-1 12/18/2019 6:16 PM

## 2020-01-01 NOTE — Progress Notes (Signed)
RT NOTES: Critical ABG results given to Dr Sabra Heck. Respiratory rate increased to 28.

## 2020-01-01 NOTE — Progress Notes (Signed)
Patient transported to 2M12 without complications.  

## 2020-01-01 NOTE — Telephone Encounter (Addendum)
Faxed clinical notes successfully per [employer] [facility] [employer]'s request

## 2020-01-01 NOTE — ED Triage Notes (Signed)
Patient presents to ED via [place] she has been sob for several weeks. Family member at bedside. States patient has been very sob, Patient wasn't able to hardly speak. Sates were consistently in the uppers 0's with NRB> Dr . Tyrone Nine at bedside.

## 2020-01-01 NOTE — Progress Notes (Addendum)
[place] for heparin Indication: chest pain/ACS  Allergies  Allergen Reactions  . Blood-Group Specific Substance Other (See Comments)    NO BLOOD - JEHOVAH'S WITNESS  . Lisinopril Other (See Comments)    Chronic cough secondary to ACE  . Oxycodone-Acetaminophen Hives, Itching and Other (See Comments)    flushing  . Percocet [Oxycodone-Acetaminophen] Hives  . Victoza [Liraglutide] Other (See Comments)    Made her feel "out of it"  . Atorvastatin Other (See Comments)    myalgias    Patient Measurements: Height: 5\' 1"  (154.9 cm) Weight: 200 lb (90.7 kg) IBW/kg (Calculated) : 47.8 Heparin Dosing Weight: 69kg  Vital Signs: Temp: 98.3 F (36.8 C) (03/30 2046) Temp Source: Oral (03/30 2046) BP: 132/43 (03/30 1930) Pulse Rate: 83 (03/30 2030)  Labs: Recent Labs    12/10/2019 0753 12/28/2019 0818 12/19/2019 1008 12/18/2019 1124 12/05/2019 1124 12/25/2019 1652 12/11/2019 1922 12/21/2019 1925 12/12/2019 2103  HGB 11.5*   < >  --  12.2   < > 14.6  --  10.6*  --   HCT 37.1   < >  --  36.0  --  43.0  --  35.4*  --   PLT 220  --   --   --   --   --   --  257  --   HEPARINUNFRC  --   --   --   --   --   --   --   --  0.98*  CREATININE 1.43*  --   --   --   --   --  1.97*  --   --   TROPONINIHS 3,076*  --  3,427*  --   --   --  3,446*  --   --    < > = values in this interval not displayed.    Estimated Creatinine Clearance: 28 mL/min (A) (by C-G formula based on SCr of 1.97 mg/dL (H)).   Medical History: Past Medical History:  Diagnosis Date  . Arthritis    "knees, lower back" (08/18/2017)  . 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 S/p lumpectomy and radiation.  Marland Kitchen CAD (coronary artery disease) 11/07   cath 11/11 :  3V CADsee report   . Cataract    small both eyes  . Chronic lower back pain   . Chronic pain    MR 08/07/10 :  Spondylosis L4-5 with B foraminal narrowing and L4 nerve root  enchroachment B.  . Chronic venous insufficiency 2012   Has had full W/U incl ECHO, LFT's, Cr, Umicro, TSH, BNP. Symptomatic treatment.  . Colonic polyp 1995   Colonoscopies 1994, 2000, and 02/02/2011. Last had 9 polyps - Tubular adenoma and tubulovillous was the pathology results without high grade dysplasia  . Diabetes mellitus type II    Insulin dependent. Has worked with [name] R previously.  . Diastolic CHF, chronic ([place])    NL EF by echo 01/2012, Gr I dd  . Essential hypertension    Requires 5 drug therapy and still difficult to control.  Marland Kitchen GERD (gastroesophageal reflux disease)   . Heart murmur   . History of radiation therapy 10/02/18- 11/09/18   Left chest wall SCV, Left chest wall boost, Right chest wall SCV, right chest boost all received 28 fractions for a total dose of 50.04 Gy  . Hyperlipidemia    On daily statin  . Iron deficiency anemia  Ferritin was 12 in 2012. on FeSo4. Has had colon and EGD 02/02/11 that showed mild gastritis and colon polyps.  . Obesity    Morbid. HAs worked with [name] T previously.  . OSA (obstructive sleep apnea) 02/16/2007   02/2007 : Moderate AHI 35.6, O2 sat decreased to 65%, CPAP titration to 16 with AHI of 3.6. 02/2014 : Rare respiratory events with sleep disturbance, within normal limits. AHI 0.4 per hour      . OSA on CPAP   . Personal history of chemotherapy   . Personal history of radiation therapy    "to my left breast"  . Refusal of blood transfusions as patient is Jehovah's Witness   . Seasonal allergies    "grass pollen"    Medications:  Infusions:  . sodium chloride    . sodium chloride 1,000 mL (12/28/2019 2210)  . dextrose 5 % and 0.45% NaCl    . fentaNYL infusion INTRAVENOUS    . heparin 1,400 Units/hr (12/19/2019 1430)  . insulin 11.5 Units/hr (12/26/2019 2200)  . norepinephrine (LEVOPHED) Adult infusion 34 mcg/min (12/20/2019 2113)  . propofol (DIPRIVAN) infusion 27.8 mcg/kg/min (12/06/2019 1752)    Assessment: 50 yof presented to  the ED with SOB. Troponin elevated and now starting IV heparin. Hgb is slightly low and platelets are WNL. She is not on anticoagulation PTA.  Initial heparin level resulted high at 0.98. Heparin infusing in L AC, level drawn from central line per RN. Will get stat repeat heparin level to confirm. No issues with infusion per discussion with RN. RN does report bleeding from central line earlier today, currently resolved with sandbag applied. She will notify Rx if worsens.  Goal of Therapy:  Heparin level 0.3-0.7 units/ml Monitor platelets by anticoagulation protocol: Yes   Plan:  Stat repeat heparin level Continue heparin gtt 1400 units/hr for now Monitor daily CBC, s/sx bleeding   Arturo Morton, PharmD, BCPS Please check AMION for all [place] contact numbers Clinical Pharmacist 12/14/2019 10:20 PM

## 2020-01-01 NOTE — Consult Note (Addendum)
Cardiology Consultation:   Patient ID: SHELLI LIVINGS MRN: [id number]; DOB: 08-01-1951  Admit date: 12/10/2019 Date of Consult: 12/14/2019  Primary Care Provider: Bartholomew Crews, MD Primary Cardiologist: Minus Breeding, MD   Patient Profile:   [name] is a 69 y.o. female with a hx of three-vessel coronary artery disease, hypertension, hyperlipidemia with hx of statin intolerance (followed in lipid clinic), breast cancer s/p chemoradiation, obesity and diabetes mellitus who is being seen today for the evaluation of CHF and non-STEMI at the request of Dr. Evette Doffing.  Corene Cornea in 2018 showed severe three-vessel disease.  She did not felt surgical candidate given anemia and refusal of blood products secondary to Jehovah's Witness.  Medically managed.  She did have prior cath as well.  Admitted September 2020 with non-STEMI. Cath showed severe stenosis in proximal LAD treated w/ PTCA/DES x 1 proximal LAD, severe stenosis in the first obtuse marginal branch with moderately severe disease in the circumflex, treated w/ balloon angioplasty only of the obtuse marginal branch and moderate disease in the small non-dominant RCA (medical therapy).  Diagnostic 06/13/19 Dominance: Right  Intervention     She was doing well on cardiac standpoint when last seen by Dr. Percival Spanish October 08, 2019.  History of Present Illness:   Ms. Kovaleski presented with worsening shortness of breath and chest pain.  Patient currently on BiPAP so unable to provide perfect history..  Reported having chest pain since Friday.   She also has shortness of breath.  Nitroglycerin did not improve her pain and continue to have intermittent chest discomfort.  She also has orthopnea and PND.  Compliant with her medication.  Patient has been worsening shortness of breath this morning and EMS was called and found to be in distress and hypoxic.Marland Kitchen  Also has abdominal distention and cough.  Given breathing treatment by EMS and presented  to ER.  BNP 1562. chest x-ray showed cardiomegaly with pulmonary vascular congestion.  Mild interstitial edema.  CT angio of the chest without pulmonary embolism.  Extensive bilateral pulmonary infiltrate with small bilateral pleural effusion.  She was given total IV Lasix 80 mg, IV Solu-Medrol and placed on BiPAP with improvement of breathing and abdominal distention significantly.  She is also started on broad-spectrum antibiotic.  Covid negative Creatinine 1.43  High-sensitivity troponin 3076>>3427.  Started on heparin.  Echocardiogram today showed LV function of 25 to 30% (45 to 50% 06/2019), global hypokinesis, intermediate diastolic dysfunction, elevated LVEDP, mild to moderate mitral regurgitation.   Past Medical History:  Diagnosis Date  . Arthritis    "knees, lower back" (08/18/2017)  . 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 S/p lumpectomy and radiation.  Marland Kitchen CAD (coronary artery disease) 11/07   cath 11/11 :  3V CADsee report   . Cataract    small both eyes  . Chronic lower back pain   . Chronic pain    MR 08/07/10 :  Spondylosis L4-5 with B foraminal narrowing and L4 nerve root enchroachment B.  . Chronic venous insufficiency 2012   Has had full W/U incl ECHO, LFT's, Cr, Umicro, TSH, BNP. Symptomatic treatment.  . Colonic polyp 1995   Colonoscopies 1994, 2000, and 02/02/2011. Last had 9 polyps - Tubular adenoma and tubulovillous was the pathology results without high grade dysplasia  . Diabetes mellitus type II    Insulin dependent. Has worked with [name] R previously.  . Diastolic CHF, chronic (HCC)    NL EF  by echo 01/2012, Gr I dd  . Essential hypertension    Requires 5 drug therapy and still difficult to control.  Marland Kitchen GERD (gastroesophageal reflux disease)   . Heart murmur   . History of radiation therapy 10/02/18- 11/09/18   Left chest wall SCV, Left chest wall boost, Right chest wall SCV, right chest boost all received 28  fractions for a total dose of 50.04 Gy  . Hyperlipidemia    On daily statin  . Iron deficiency anemia    Ferritin was 12 in 2012. on FeSo4. Has had colon and EGD 02/02/11 that showed mild gastritis and colon polyps.  . Obesity    Morbid. HAs worked with [name] T previously.  . OSA (obstructive sleep apnea) 02/16/2007   02/2007 : Moderate AHI 35.6, O2 sat decreased to 65%, CPAP titration to 16 with AHI of 3.6. 02/2014 : Rare respiratory events with sleep disturbance, within normal limits. AHI 0.4 per hour      . OSA on CPAP   . Personal history of chemotherapy   . Personal history of radiation therapy    "to my left breast"  . Refusal of blood transfusions as patient is Jehovah's Witness   . Seasonal allergies    "grass pollen"    Past Surgical History:  Procedure Laterality Date  . BREAST LUMPECTOMY Left 2006  . BREAST LUMPECTOMY W/ NEEDLE LOCALIZATION  2006   34 Chemo RX  . CARDIAC CATHETERIZATION N/A 08/15/2015   Procedure: Left Heart Cath and Coronary Angiography;  Surgeon: Troy Sine, MD;  Location: [facility];  Service: Cardiovascular;  Laterality: N/A;  . COLONOSCOPY W/ BIOPSIES AND POLYPECTOMY  1994, 2000, 2012, 26/018  . CORONARY STENT INTERVENTION N/A 06/13/2019   Procedure: CORONARY STENT INTERVENTION;  Surgeon: Burnell Blanks, MD;  Location: [facility];  Service: Cardiovascular;  Laterality: N/A;  . IR IMAGING GUIDED PORT INSERTION  04/17/2018  . IR REMOVAL TUN ACCESS W/ PORT W/O FL MOD SED  11/14/2018  . LEFT HEART CATH AND CORONARY ANGIOGRAPHY N/A 08/19/2017   Procedure: LEFT HEART CATH AND CORONARY ANGIOGRAPHY;  Surgeon: Belva Crome, MD;  Location: [facility];  Service: Cardiovascular;  Laterality: N/A;  . LEFT HEART CATH AND CORONARY ANGIOGRAPHY N/A 06/13/2019   Procedure: LEFT HEART CATH AND CORONARY ANGIOGRAPHY;  Surgeon: Burnell Blanks, MD;  Location: [facility];  Service: Cardiovascular;  Laterality: N/A;  . MASTECTOMY  MODIFIED RADICAL Bilateral 07/20/2018  . MASTECTOMY MODIFIED RADICAL Bilateral 07/20/2018   Procedure: BILATERAL MODIFIED RADICAL MASTECTOMIES;  Surgeon: Jovita Kussmaul, MD;  Location: [facility];  Service: General;  Laterality: Bilateral;  . PORTACATH PLACEMENT N/A 04/14/2018   Procedure: ATTEMPED INSERTION PORT-A-CATH;  Surgeon: Jovita Kussmaul, MD;  Location: [facility];  Service: General;  Laterality: N/A;  . TONSILLECTOMY AND ADENOIDECTOMY  1964  . TOTAL ABDOMINAL HYSTERECTOMY  1985   "I had endometrosis"  . ULTRASOUND GUIDANCE FOR VASCULAR ACCESS  08/19/2017   Procedure: Ultrasound Guidance For Vascular Access;  Surgeon: Belva Crome, MD;  Location: [facility];  Service: Cardiovascular;;     Inpatient Medications: Scheduled Meds: . [START ON 01/02/2020] aspirin EC  81 mg Oral Daily  . [START ON 01/02/2020] furosemide  80 mg Intravenous BID  . insulin aspart  0-15 Units Subcutaneous Q4H  . LORazepam      . rosuvastatin  10 mg Oral Daily   Continuous Infusions: . heparin 1,400 Units/hr (12/30/2019 1430)  .  vancomycin 2,000 mg (12/15/2019 1345)   PRN Meds: perflutren lipid microspheres (DEFINITY) IV suspension  Allergies:    Allergies  Allergen Reactions  . Blood-Group Specific Substance Other (See Comments)    NO BLOOD - JEHOVAH'S WITNESS  . Lisinopril Other (See Comments)    Chronic cough secondary to ACE  . Oxycodone-Acetaminophen Hives, Itching and Other (See Comments)    flushing  . Percocet [Oxycodone-Acetaminophen] Hives  . Victoza [Liraglutide] Other (See Comments)    Made her feel "out of it"  . Atorvastatin Other (See Comments)    myalgias    Social History:   Social History   Socioeconomic History  . Marital status: Legally Separated    Spouse name: Not on file  . Number of children: Not on file  . Years of education: 31  . Highest education level: Associate degree: academic program  Occupational History  . Not on file  Tobacco Use  . Smoking status:  Never Smoker  . Smokeless tobacco: Never Used  Substance and Sexual Activity  . Alcohol use: No  . Drug use: No  . Sexual activity: Not Currently  Other Topics Concern  . Not on file  Social History Narrative   Worked at [employer] part-time from [phone], 2009-2011. No longer working 03-Jan-2014.Associates degree in early childhood education. Single, lives by self.   Social Determinants of Health   Financial Resource Strain: Low Risk   . Difficulty of Paying Living Expenses: Not hard at all  Food Insecurity: No Food Insecurity  . Worried About Charity fundraiser in the Last Year: Never true  . Ran Out of Food in the Last Year: Never true  Transportation Needs:   . Lack of Transportation (Medical):   Marland Kitchen Lack of Transportation (Non-Medical):   Physical Activity: Unknown  . Days of Exercise per Week: 2 days  . Minutes of Exercise per Session: Not asked  Stress:   . Feeling of Stress :   Social Connections:   . Frequency of Communication with Friends and Family:   . Frequency of Social Gatherings with Friends and Family:   . Attends Religious Services:   . Active Member of Clubs or Organizations:   . Attends Archivist Meetings:   Marland Kitchen Marital Status:   Intimate Partner Violence:   . Fear of Current or Ex-Partner:   . Emotionally Abused:   Marland Kitchen Physically Abused:   . Sexually Abused:     Family History:    Family History  Problem Relation Age of Onset  . Lung cancer Mother   . Diabetes Father   . Breast cancer Sister   . Pulmonary embolism Brother   . Depression Daughter 59  . Osteoarthritis Maternal Grandmother   . Heart attack Brother 44  . Kidney failure Brother        died 01-04-16 2/2 kidney and heart failure  . Colon cancer Neg Hx   . Colon polyps Neg Hx   . Esophageal cancer Neg Hx   . Rectal cancer Neg Hx   . Stomach cancer Neg Hx      ROS:  Please see the history of present illness.  All other ROS reviewed and negative.     Physical Exam/Data:    Vitals:   12/15/2019 1400 12/03/2019 1415 12/25/2019 1430 12/20/2019 1445  BP: (!) 118/93 (!) 119/49 121/76 112/81  Pulse: 98 94 96 95  Resp:      Temp:      TempSrc:  SpO2: 91% 92% 92% 93%  Weight:      Height:        Intake/Output Summary (Last 24 hours) at 12/08/2019 1530 Last data filed at 12/27/2019 1339 Gross per 24 hour  Intake 150 ml  Output --  Net 150 ml   Last 3 Weights 12/18/2019 12/14/2019 12/07/2019  Weight (lbs) 200 lb 208 lb 8 oz 204 lb 6.4 oz  Weight (kg) 90.719 kg 94.575 kg 92.715 kg     Body mass index is 37.79 kg/m.  General: Obese female on BiPAP HEENT: normal Lymph: no adenopathy Neck: Difficult to evaluate JVD Endocrine:  No thryomegaly Vascular: No carotid bruits; FA pulses 2+ bilaterally without bruits  Cardiac:  normal S1, S2; RRR; no murmur Lungs: Faint Rales \ abd: soft, distended abdomen, no hepatomegaly  Ext: 1+ bilateral lower extremity edema with compression stocking Musculoskeletal:  No deformities, BUE and BLE strength normal and equal Skin: warm and dry  Neuro:  CNs 2-12 intact, no focal abnormalities noted Psych:  Normal affect   EKG:  The EKG was personally reviewed and demonstrates: Sinus tachycardia with diffuse ST elevation anteriorly with repolarization abnormality  Relevant CV Studies:  Echo 12/26/2019 1. Left ventricular ejection fraction, by estimation, is 25 to 30%. The  left ventricle has severely decreased function. The left ventricle  demonstrates global hypokinesis. The left ventricular internal cavity size  was mildly dilated. Left ventricular  diastolic parameters are indeterminate. Elevated left ventricular  end-diastolic pressure. There is severe akinesis of the left ventricular,  entire inferior wall, inferoseptal wall and inferolateral wall.  2. Right ventricular systolic function is mildly reduced. The right  ventricular size is normal. There is moderately elevated pulmonary artery  systolic pressure.  3. The  mitral valve is abnormal. Mild to moderate mitral valve  regurgitation.  4. The aortic valve is tricuspid. Aortic valve regurgitation is trivial.  Mild aortic valve sclerosis is present, with no evidence of aortic valve  stenosis.  5. The inferior vena cava is dilated in size with <50% respiratory  variability, suggesting right atrial pressure of 15 mmHg.   Laboratory Data:  High Sensitivity Troponin:   Recent Labs  Lab 12/23/2019 0753 12/06/2019 1008  TROPONINIHS 3,076* 3,427*     Chemistry Recent Labs  Lab 12/21/2019 0753 12/18/2019 0818 12/24/2019 1124  NA 138 139 136  K 4.1 4.0 4.4  CL 102  --   --   CO2 23  --   --   GLUCOSE 245*  --   --   BUN 29*  --   --   CREATININE 1.43*  --   --   CALCIUM 9.2  --   --   GFRNONAA 38*  --   --   GFRAA 44*  --   --   ANIONGAP 13  --   --     Recent Labs  Lab 12/15/2019 0753  PROT 7.7  ALBUMIN 3.5  AST 43*  ALT 26  ALKPHOS 68  BILITOT 0.3   Hematology Recent Labs  Lab 12/10/2019 0753 12/21/2019 0818 12/07/2019 1124  WBC 7.7  --   --   RBC 3.91  --   --   HGB 11.5* 9.5* 12.2  HCT 37.1 28.0* 36.0  MCV 94.9  --   --   MCH 29.4  --   --   MCHC 31.0  --   --   RDW 15.7*  --   --   PLT 220  --   --  BNP Recent Labs  Lab 12/10/2019 0753  BNP 1,562.2*    DDimer No results for input(s): DDIMER in the last 168 hours.   Radiology/Studies:  CT Angio Chest PE W and/or Wo Contrast  Result Date: 12/09/2019 CLINICAL DATA:  Shortness of breath. EXAM: CT ANGIOGRAPHY CHEST WITH CONTRAST TECHNIQUE: Multidetector CT imaging of the chest was performed using the standard protocol during bolus administration of intravenous contrast. Multiplanar CT image reconstructions and MIPs were obtained to evaluate the vascular anatomy. CONTRAST:  75mL OMNIPAQUE IOHEXOL 350 MG/ML SOLN COMPARISON:  Chest CT dated 04/20/2018 FINDINGS: Cardiovascular: No pulmonary emboli. Aortic atherosclerosis. Extensive coronary artery calcifications. Borderline  cardiomegaly. No pericardial effusion. 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 Thyroid gland esophagus are normal. No hilar or mediastinal. Lungs/Pleura: The patient has extensive bilateral pulmonary infiltrates as well as small bilateral pleural effusions, right greater than left. No pulmonary edema. Upper Abdomen: Normal. Musculoskeletal: There is a fluid collection in the left anterolateral chest wall measuring 9.5 by 3.1 by 7.4 cm. Patient has had bilateral mastectomies. This may represent a postoperative seroma. Was not present on the prior study. No bone abnormality. Review of the MIP images confirms the above findings. IMPRESSION: 1. No pulmonary emboli. 2. Extensive bilateral pulmonary infiltrates with small bilateral pleural effusions, right greater than left. 3. Tracheomalacia, new since the prior study. 4. Fluid collection in the left anterolateral chest wall, probably a postoperative seroma. Aortic Atherosclerosis (ICD10-I70.0). Electronically Signed   By: Lorriane Shire M.D.   On: 12/09/2019 12:27   DG Chest Port 1 View  Result Date: 12/23/2019 CLINICAL DATA:  Shortness of breath and cough EXAM: PORTABLE CHEST 1 VIEW COMPARISON:  June 10, 2019 FINDINGS: There is cardiomegaly with pulmonary venous hypertension. There is mild interstitial edema. There is bibasilar atelectasis. There is no frank consolidation. No adenopathy. Postoperative change noted in each axillary region. There is degenerative change in each shoulder. IMPRESSION: Cardiomegaly with pulmonary vascular congestion. Mild interstitial edema. Suspect a degree of underlying congestive heart failure. Bibasilar atelectasis. No consolidation. Electronically Signed   By: Lowella Grip III M.D.   On: 12/27/2019 08:38   ECHOCARDIOGRAM COMPLETE  Result Date: 01/02/2020    ECHOCARDIOGRAM REPORT   Patient Name:   [name] Date of Exam: 12/25/2019 Medical Rec #:  [id number]       Height:        61.0 in Accession #:    [id number]      Weight:       200.0 lb Date of Birth:  1951-06-27       BSA:          1.889 m Patient Age:    71 years        BP:           106/73 mmHg Patient Gender: F               HR:           98 bpm. Exam Location:  Inpatient Procedure: 2D Echo, Color Doppler, Cardiac Doppler and Intracardiac            Opacification Agent STAT ECHO Indications:    R06.9 DOE  History:        Patient has prior history of Echocardiogram examinations, most                 recent 06/11/2019. CHF, CAD; Risk Factors:Hypertension, Diabetes,  Dyslipidemia and Sleep Apnea. Breast Cancer.  Sonographer:    Raquel Sarna Senior RDCS Referring Phys: [id number] DAN FLOYD  Sonographer Comments: Technically difficult due to mastectomy and patient sitting upright on bipap. IMPRESSIONS  1. Left ventricular ejection fraction, by estimation, is 25 to 30%. The left ventricle has severely decreased function. The left ventricle demonstrates global hypokinesis. The left ventricular internal cavity size was mildly dilated. Left ventricular diastolic parameters are indeterminate. Elevated left ventricular end-diastolic pressure. There is severe akinesis of the left ventricular, entire inferior wall, inferoseptal wall and inferolateral wall.  2. Right ventricular systolic function is mildly reduced. The right ventricular size is normal. There is moderately elevated pulmonary artery systolic pressure.  3. The mitral valve is abnormal. Mild to moderate mitral valve regurgitation.  4. The aortic valve is tricuspid. Aortic valve regurgitation is trivial. Mild aortic valve sclerosis is present, with no evidence of aortic valve stenosis.  5. The inferior vena cava is dilated in size with <50% respiratory variability, suggesting right atrial pressure of 15 mmHg. Comparison(s): 06/05/2019: LVEF 45-50%, global hypokinesis. FINDINGS  Left Ventricle: Left ventricular ejection fraction, by estimation, is 25 to 30%. The left ventricle has  severely decreased function. The left ventricle demonstrates global hypokinesis. Severe akinesis of the left ventricular, entire inferior wall, inferoseptal wall and inferolateral wall. Definity contrast agent was given IV to delineate the left ventricular endocardial borders. The left ventricular internal cavity size was mildly dilated. There is no left ventricular hypertrophy. Left ventricular  diastolic parameters are indeterminate. Elevated left ventricular end-diastolic pressure. Right Ventricle: The right ventricular size is normal. No increase in right ventricular wall thickness. Right ventricular systolic function is mildly reduced. There is moderately elevated pulmonary artery systolic pressure. The tricuspid regurgitant velocity is 2.80 m/s, and with an assumed right atrial pressure of 15 mmHg, the estimated right ventricular systolic pressure is [id number] mmHg. Left Atrium: Left atrial size was normal in size. Right Atrium: Right atrial size was normal in size. Pericardium: There is no evidence of pericardial effusion. Mitral Valve: The mitral valve is abnormal. There is mild thickening of the mitral valve leaflet(s). Mild to moderate mitral valve regurgitation. Tricuspid Valve: The tricuspid valve is grossly normal. Tricuspid valve regurgitation is trivial. Aortic Valve: The aortic valve is tricuspid. Aortic valve regurgitation is trivial. Mild aortic valve sclerosis is present, with no evidence of aortic valve stenosis. Pulmonic Valve: The pulmonic valve was grossly normal. Pulmonic valve regurgitation is trivial. Aorta: The aortic root and ascending aorta are structurally normal, with no evidence of dilitation. Venous: The inferior vena cava is dilated in size with less than 50% respiratory variability, suggesting right atrial pressure of 15 mmHg. IAS/Shunts: No atrial level shunt detected by color flow Doppler.  LEFT VENTRICLE PLAX 2D LVIDd:         5.80 cm  Diastology LVIDs:         5.40 cm  LV e'  lateral:   5.22 cm/s LV PW:         0.80 cm  LV E/e' lateral: 18.0 LV IVS:        0.50 cm  LV e' medial:    4.90 cm/s LVOT diam:     2.00 cm  LV E/e' medial:  19.1 LV SV:         35 LV SV Index:   18 LVOT Area:     3.14 cm  RIGHT VENTRICLE RV S prime:     8.16 cm/s TAPSE (M-mode): 1.0 cm LEFT ATRIUM  Index       RIGHT ATRIUM           Index LA diam:      3.30 cm 1.75 cm/m  RA Area:     11.40 cm LA Vol (A4C): 41.3 ml 21.87 ml/m RA Volume:   23.50 ml  12.44 ml/m  AORTIC VALVE LVOT Vmax:   59.90 cm/s LVOT Vmean:  42.500 cm/s LVOT VTI:    0.110 m  AORTA Ao Root diam: 3.00 cm MITRAL VALVE               TRICUSPID VALVE MV Area (PHT): 4.89 cm    TR Peak grad:   31.4 mmHg MV Decel Time: 155 msec    TR Vmax:        280.00 cm/s MV E velocity: 93.80 cm/s MV A velocity: 24.20 cm/s  SHUNTS MV E/A ratio:  3.88        Systemic VTI:  0.11 m                            Systemic Diam: 2.00 cm Lyman Bishop MD Electronically signed by Lyman Bishop MD Signature Date/Time: 12/21/2019/2:47:35 PM    Final     TIMI Risk Score for Unstable Angina or Non-ST Elevation MI:   The patient's TIMI risk score is 7, which indicates a 41% risk of all cause mortality, new or recurrent myocardial infarction or need for urgent revascularization in the next 14 days.   Assessment and Plan:   1. Non-STEMI with known three-vessel disease (previously trended down for CABG secondary to decline blood product in 2018) s/p DES to LAD in 06/2019 -Presenting with symptoms concerning for angina in setting of acute heart failure. -EKG with nonspecific changes.  High-sensitivity troponin 3076>>3427 -given decline in LV function patient will need ischemic evaluation when stable, likely Friday after diuresis  - Continue ASA and Brillinta  2.  Acute on chronic combined CHF - BNP 1562, chest x-ray with pulmonary edema - Echocardiogram today showed LV function of 25 to 30% (45 to 50% 06/2019), global hypokinesis, intermediate diastolic  dysfunction, elevated LVEDP, mild to moderate mitral regurgitation.  - Continue IV diuresis with BiPAP>> strict I & O  - watch renal function  -Home beta-blocker and losartan on hold due to soft blood pressure>> consider entresto this admission  3.  Hypertension -Home antihypertensive on hold due to soft blood pressure  4. DM - Per primary   5. HLD -hx of statin intolerance.  Followed in lipid clinic.  Continue home regimen.  Will follow along   For questions or updates, please contact [place] Please consult [email or web address] for contact info under     Jarrett Soho, [place]  12/19/2019 3:30 PM   Attending Note:   The patient was seen and examined.  Agree with assessment and plan as noted above.  Changes made to the above note as needed.  Patient seen and independently examined with  Robbie Lis, PA .   We discussed all aspects of the encounter. I agree with the assessment and plan as stated above.  1.  Acute respiratory failure: When I went in to evaluate the patient she was thrashing about in bed.  She had the BiPAP on.  She was noncommunicative.  Her O2 sats were in the upper 80s.   She is being intubated currently   In talking with her daughter she is been having anginal-like chest pain for the  past 5 days.  She only came in today when she developed severe respiratory distress.  Her daughter states that she still eats quite a bit of salty foods and processed meats.  She has known three-vessel coronary artery disease and was turned down for bypass surgery because of her unwillingness to have a blood transfusion.  She had a PCI to the LAD and POBA to the LCX  several years ago but continues to have episodes of angina.  Dr. Angelena Form was unable to deliver the stent to the proper location of the  LCX and so it was not deployed.   Troponins are elevated in the 3000 range.  Echocardiogram from today reveals new severe LV dysfunction with an ejection fraction of 25 to 30%.   She has akinesis of the inferior wall.  She has moderately elevated pulmonary pressures.  Previous ejection fraction was 45 to 50%.  I have reviewed the case with Dr. Pernell Dupre, interventionalists.  At this point there is probably a little bit we can do.  The infarct area appears to be the circumflex distribution.  Dr. Angelena Form has already tried to open that and was unsuccessful.  He was unable to adequately deliver a stent to the lesion in the circumflex artery without having a stent in the left main.  It sounds like this infarct started approximately 5 days ago.  At this time we will continue with supportive therapy/medical therapy of her infarct.    2.  Acute on Chronic combined CHF:   Cont supportive care .  Diurese Intubated, on the vent  Supportive care EF has fallen from 45% to 25-30% since last echo    3.  Coronary artery disease: The patient has severe three-vessel coronary artery disease.  She has PTCA and stenting of her LAD.  Dr. Angelena Form performed PTCA of her left circumflex artery but was unable to deliver a stent to the proper location.  She was left with a 70 to 80% stenosis in her left circumflex artery.  I suspect that this artery has close down and that this is the infarct vessel.  This corresponds to her area of akinesis in the inferoapical region.  4.  Diabetes mellitus: Continue current medications.  She needs to work on a better diet, exercise, weight loss program.  5.  Hypertension: We will continue medical therapy for hypertension.    I have spent a total of 40 minutes with patient reviewing hospital  notes , telemetry, EKGs, labs and examining patient as well as establishing an assessment and plan that was discussed with the patient. > 50% of time was spent in direct patient care.    Thayer Headings, Brooke Bonito., MD, [facility] 12/06/2019, 4:07 PM [address]  [place] Pager [phone]

## 2020-01-01 NOTE — Progress Notes (Signed)
[facility] Progress Note Patient Name: [name] DOB: [birth year] MRN: [id number]   Date of Service  12/10/2019  HPI/Events of Note  Last pH = 7.045 and lactic Acid = 7.9.  eICU Interventions  Will order: 1. ABG and Lactic Acid STAT.     Intervention Category Major Interventions: Acid-Base disturbance - evaluation and management;Respiratory failure - evaluation and management  Lysle Dingwall 12/17/2019, 9:42 PM

## 2020-01-01 NOTE — ED Notes (Signed)
Patient is much calmer. Resting at present. Family remains at bedside.

## 2020-01-01 NOTE — Progress Notes (Signed)
RT NOTES: Patient placed on bipap per MD order.

## 2020-01-01 NOTE — Procedures (Signed)
Critical care central line insertion note  Indication: Vasopressor requirement, CVP monitoring.  Consent obtained: From daughters.  Preprocedural timeout called: Yes  Full aseptic bundle used: Chlorhexidine prep full drape and gown.  Ultrasound guidance: Real-time ultrasound guidance.  Line inserted: 7 French 20 cm triple-lumen catheter  Insertion depth: 19 cm  Vessel used: Left internal jugular  Number of attempts: Single pass  Line placement confirmation: Wire visualized in lumen of vessel.     X-ray performed: Position confirmed in SVC.  No pneumothorax.  Kipp Brood, MD [facility] ICU Physician [place]  Pager: [phone] Mobile: [phone] After hours: [phone].  11/27/2018, 6:28 PM

## 2020-01-02 ENCOUNTER — Ambulatory Visit: Payer: Medicare Other | Admitting: Dietician

## 2020-01-02 ENCOUNTER — Other Ambulatory Visit: Payer: Self-pay

## 2020-01-02 ENCOUNTER — Encounter (HOSPITAL_COMMUNITY): Payer: Self-pay | Admitting: Pulmonary Disease

## 2020-01-02 DIAGNOSIS — I5023 Acute on chronic systolic (congestive) heart failure: Secondary | ICD-10-CM

## 2020-01-02 LAB — CBC
HCT: 32.8 % — ABNORMAL LOW (ref 36.0–46.0)
HCT: 30.2 % — ABNORMAL LOW (ref 36.0–46.0)
Hemoglobin: 10.2 g/dL — ABNORMAL LOW (ref 12.0–15.0)
Hemoglobin: 9.5 g/dL — ABNORMAL LOW (ref 12.0–15.0)
MCH: 29.8 pg (ref 26.0–34.0)
MCH: 29.4 pg (ref 26.0–34.0)
MCHC: 31.1 g/dL (ref 30.0–36.0)
MCHC: 31.5 g/dL (ref 30.0–36.0)
MCV: 95.9 fL (ref 80.0–100.0)
MCV: 93.5 fL (ref 80.0–100.0)
Platelets: 249 10*3/uL (ref 150–400)
Platelets: 199 10*3/uL (ref 150–400)
RBC: 3.42 MIL/uL — ABNORMAL LOW (ref 3.87–5.11)
RBC: 3.23 MIL/uL — ABNORMAL LOW (ref 3.87–5.11)
RDW: 15.4 % (ref 11.5–15.5)
RDW: 15.3 % (ref 11.5–15.5)
WBC: 17.4 10*3/uL — ABNORMAL HIGH (ref 4.0–10.5)
WBC: 15 10*3/uL — ABNORMAL HIGH (ref 4.0–10.5)
nRBC: 0.3 % — ABNORMAL HIGH (ref 0.0–0.2)
nRBC: 0.3 % — ABNORMAL HIGH (ref 0.0–0.2)

## 2020-01-02 LAB — COMPREHENSIVE METABOLIC PANEL
ALT: 166 U/L — ABNORMAL HIGH (ref 0–44)
AST: 212 U/L — ABNORMAL HIGH (ref 15–41)
Albumin: 3 g/dL — ABNORMAL LOW (ref 3.5–5.0)
Alkaline Phosphatase: 61 U/L (ref 38–126)
Anion gap: 14 (ref 5–15)
BUN: 40 mg/dL — ABNORMAL HIGH (ref 8–23)
CO2: 18 mmol/L — ABNORMAL LOW (ref 22–32)
Calcium: 8.1 mg/dL — ABNORMAL LOW (ref 8.9–10.3)
Chloride: 105 mmol/L (ref 98–111)
Creatinine, Ser: 1.83 mg/dL — ABNORMAL HIGH (ref 0.44–1.00)
GFR calc Af Amer: 32 mL/min — ABNORMAL LOW (ref >60)
GFR calc non Af Amer: 28 mL/min — ABNORMAL LOW (ref >60)
Glucose, Bld: 264 mg/dL — ABNORMAL HIGH (ref 70–99)
Potassium: 3.6 mmol/L (ref 3.5–5.1)
Sodium: 137 mmol/L (ref 135–145)
Total Bilirubin: 0.2 mg/dL — ABNORMAL LOW (ref 0.3–1.2)
Total Protein: 6.8 g/dL (ref 6.5–8.1)

## 2020-01-02 LAB — URINALYSIS, ROUTINE W REFLEX MICROSCOPIC
Bilirubin Urine: NEGATIVE
Glucose, UA: NEGATIVE mg/dL
Ketones, ur: NEGATIVE mg/dL
Nitrite: NEGATIVE
Protein, ur: NEGATIVE mg/dL
Specific Gravity, Urine: 1.018 (ref 1.005–1.030)
pH: 5 (ref 5.0–8.0)

## 2020-01-02 LAB — POCT I-STAT 7, (LYTES, BLD GAS, ICA,H+H)
Acid-base deficit: 8 mmol/L — ABNORMAL HIGH (ref 0.0–2.0)
Bicarbonate: 17.9 mmol/L — ABNORMAL LOW (ref 20.0–28.0)
Calcium, Ion: 1.12 mmol/L — ABNORMAL LOW (ref 1.15–1.40)
HCT: 33 % — ABNORMAL LOW (ref 36.0–46.0)
Hemoglobin: 11.2 g/dL — ABNORMAL LOW (ref 12.0–15.0)
O2 Saturation: 100 %
Patient temperature: 98
Potassium: 4 mmol/L (ref 3.5–5.1)
Sodium: 138 mmol/L (ref 135–145)
TCO2: 19 mmol/L — ABNORMAL LOW (ref 22–32)
pCO2 arterial: 34.6 mmHg (ref 32.0–48.0)
pH, Arterial: 7.32 — ABNORMAL LOW (ref 7.350–7.450)
pO2, Arterial: 308 mmHg — ABNORMAL HIGH (ref 83.0–108.0)

## 2020-01-02 LAB — HEPARIN LEVEL (UNFRACTIONATED)
Heparin Unfractionated: 1.22 IU/mL — ABNORMAL HIGH (ref 0.30–0.70)
Heparin Unfractionated: <0.1 IU/mL — ABNORMAL LOW (ref 0.30–0.70)

## 2020-01-02 LAB — GLUCOSE, CAPILLARY
Glucose-Capillary: 118 mg/dL — ABNORMAL HIGH (ref 70–99)
Glucose-Capillary: 158 mg/dL — ABNORMAL HIGH (ref 70–99)
Glucose-Capillary: 181 mg/dL — ABNORMAL HIGH (ref 70–99)
Glucose-Capillary: 181 mg/dL — ABNORMAL HIGH (ref 70–99)
Glucose-Capillary: 185 mg/dL — ABNORMAL HIGH (ref 70–99)
Glucose-Capillary: 189 mg/dL — ABNORMAL HIGH (ref 70–99)
Glucose-Capillary: 199 mg/dL — ABNORMAL HIGH (ref 70–99)
Glucose-Capillary: 221 mg/dL — ABNORMAL HIGH (ref 70–99)
Glucose-Capillary: 229 mg/dL — ABNORMAL HIGH (ref 70–99)
Glucose-Capillary: 250 mg/dL — ABNORMAL HIGH (ref 70–99)
Glucose-Capillary: 258 mg/dL — ABNORMAL HIGH (ref 70–99)
Glucose-Capillary: 300 mg/dL — ABNORMAL HIGH (ref 70–99)
Glucose-Capillary: 334 mg/dL — ABNORMAL HIGH (ref 70–99)
Glucose-Capillary: 354 mg/dL — ABNORMAL HIGH (ref 70–99)
Glucose-Capillary: 381 mg/dL — ABNORMAL HIGH (ref 70–99)
Glucose-Capillary: 428 mg/dL — ABNORMAL HIGH (ref 70–99)
Glucose-Capillary: 95 mg/dL (ref 70–99)

## 2020-01-02 MED ORDER — HEPARIN (PORCINE) 25000 UT/250ML-% IV SOLN
1200.0000 [IU]/h | INTRAVENOUS | Status: DC
  Administered 2020-01-02: 02:00:00 1200 [IU]/h via INTRAVENOUS

## 2020-01-02 MED ORDER — INSULIN DETEMIR 100 UNIT/ML ~~LOC~~ SOLN
5.0000 [IU] | Freq: Two times a day (BID) | SUBCUTANEOUS | Status: DC
  Filled 2020-01-02 (×2): qty 0.05

## 2020-01-02 MED ORDER — INSULIN DETEMIR 100 UNIT/ML ~~LOC~~ SOLN
12.0000 [IU] | Freq: Two times a day (BID) | SUBCUTANEOUS | Status: DC
  Administered 2020-01-02 – 2020-01-04 (×5): 12 [IU] via SUBCUTANEOUS
  Filled 2020-01-02 (×6): qty 0.12

## 2020-01-02 MED ORDER — SODIUM CHLORIDE 0.9% FLUSH: 10.0000 mL | INTRAVENOUS | Status: DC | PRN

## 2020-01-02 MED ORDER — NOREPINEPHRINE 16 MG/250ML-% IV SOLN
0.0000 ug/min | INTRAVENOUS | Status: DC
  Administered 2020-01-02: 10:00:00 20 ug/min via INTRAVENOUS
  Administered 2020-01-02: 34 ug/min via INTRAVENOUS
  Administered 2020-01-03: 5 ug/min via INTRAVENOUS
  Filled 2020-01-02 (×3): qty 250

## 2020-01-02 MED ORDER — ACETAMINOPHEN 650 MG RE SUPP
650.0000 mg | Freq: Once | RECTAL | Status: AC
  Administered 2020-01-02: 18:00:00 650 mg via RECTAL
  Filled 2020-01-02: qty 1

## 2020-01-02 MED ORDER — INSULIN ASPART 100 UNIT/ML ~~LOC~~ SOLN
2.0000 [IU] | SUBCUTANEOUS | Status: DC
  Administered 2020-01-02 (×2): 4 [IU] via SUBCUTANEOUS
  Administered 2020-01-02 – 2020-01-03 (×2): 6 [IU] via SUBCUTANEOUS
  Administered 2020-01-03 – 2020-01-04 (×5): 4 [IU] via SUBCUTANEOUS
  Administered 2020-01-04: 6 [IU] via SUBCUTANEOUS
  Administered 2020-01-04: 4 [IU] via SUBCUTANEOUS
  Administered 2020-01-04 (×2): 6 [IU] via SUBCUTANEOUS

## 2020-01-02 MED ORDER — SODIUM CHLORIDE 0.9% FLUSH
10.0000 mL | Freq: Two times a day (BID) | INTRAVENOUS | Status: DC
  Administered 2020-01-02: 30 mL
  Administered 2020-01-03 – 2020-01-08 (×11): 10 mL

## 2020-01-02 MED FILL — Medication: Qty: 1 | Status: AC

## 2020-01-02 NOTE — Progress Notes (Signed)
Progress Note  Patient Name: [name] Date of Encounter: 01/02/2020  Primary Cardiologist: Minus Breeding, MD    Subjective   [name] is a 69 year old female with a history of three-vessel coronary artery disease, hypertension hyperlipidemia and statin intolerance.  She has known three-vessel coronary artery disease but is not a surgical candidate given her refusal to take blood products.  She had PCI of her LAD a year ago.  She was noted to have significant disease in her circumflex artery.  Dr. Angelena Form ballooned her circumflex artery but was unable to deliver a stent.  She presented to the hospital yesterday with 5 days of ongoing chest pressure and increasing shortness of breath.  She developed respiratory failure and was intubated. Troponin speak so far at  3906. She is intubated and sedated.    Inpatient Medications    Scheduled Meds: . aspirin EC  81 mg Oral Daily  . chlorhexidine gluconate (MEDLINE KIT)  15 mL Mouth Rinse BID  . Chlorhexidine Gluconate Cloth  6 each Topical Q0600  . fentaNYL (SUBLIMAZE) injection  25 mcg Intravenous Once  . furosemide  80 mg Intravenous BID  . insulin aspart  2-6 Units Subcutaneous Q4H  . insulin detemir  5 Units Subcutaneous Q12H  . mouth rinse  15 mL Mouth Rinse 10 times per day  . pantoprazole (PROTONIX) IV  40 mg Intravenous Q24H  . rosuvastatin  10 mg Oral Daily   Continuous Infusions: . sodium chloride 10 mL/hr at 01/02/20 1300  . fentaNYL infusion INTRAVENOUS    . norepinephrine (LEVOPHED) Adult infusion 15 mcg/min (01/02/20 1300)  . propofol (DIPRIVAN) infusion 25 mcg/kg/min (01/02/20 1310)   PRN Meds: Place/Maintain arterial line **AND** sodium chloride, fentaNYL, fentaNYL (SUBLIMAZE) injection, fentaNYL (SUBLIMAZE) injection, sennosides   Vital Signs    Vitals:   01/02/20 1000 01/02/20 1100 01/02/20 1120 01/02/20 1200  BP: 110/71 (!) 102/45 (!) 108/58 104/72  Pulse: 95 92 93 97  Resp: (!) 24 (!) 22 (!) 22 (!) 25    Temp: 99.9 F (37.7 C) 100.2 F (37.9 C)  (!) 100.6 F (38.1 C)  TempSrc:      SpO2: 96% 100% 100% 99%  Weight:      Height:        Intake/Output Summary (Last 24 hours) at 01/02/2020 1441 Last data filed at 01/02/2020 1300 Gross per 24 hour  Intake 2910.99 ml  Output 3140 ml  Net -229.01 ml   Last 3 Weights 01/02/2020 01/02/2020 12/14/2019  Weight (lbs) 210 lb 12.2 oz 200 lb 208 lb 8 oz  Weight (kg) 95.6 kg 90.719 kg 94.575 kg      Telemetry    Sinus tach - Personally Reviewed  ECG     - Personally Reviewed  Physical Exam   GEN:  sedated and intubated , on the vent    Neck: No JVD Cardiac: RRR, no murmurs, rubs, or gallops.  Respiratory: vent sound  GI: Soft, nontender, non-distended  MS: No edema; No deformity. Neuro:   sedated   Psych:  Unable to assess.   Labs    High Sensitivity Troponin:   Recent Labs  Lab 12/03/2019 0753 12/20/2019 1008 12/08/2019 1922 12/10/2019 2103  TROPONINIHS 3,076* 3,427* 3,446* 3,906*      Chemistry Recent Labs  Lab 12/25/2019 0753 12/21/2019 0818 12/09/2019 1652 01/02/2020 1922 01/02/20 0119 01/02/20 0446  NA 138   < > 135  --  138 137  K 4.1   < > 4.9  --  4.0 3.6  CL 102  --   --   --   --  105  CO2 23  --   --   --   --  18*  GLUCOSE 245*  --   --   --   --  264*  BUN 29*  --   --   --   --  40*  CREATININE 1.43*  --   --  1.97*  --  1.83*  CALCIUM 9.2  --   --   --   --  8.1*  PROT 7.7  --   --   --   --  6.8  ALBUMIN 3.5  --   --   --   --  3.0*  AST 43*  --   --   --   --  212*  ALT 26  --   --   --   --  166*  ALKPHOS 68  --   --   --   --  61  BILITOT 0.3  --   --   --   --  0.2*  GFRNONAA 38*  --   --  25*  --  28*  GFRAA 44*  --   --  30*  --  32*  ANIONGAP 13  --   --   --   --  14   < > = values in this interval not displayed.     Hematology Recent Labs  Lab 12/29/2019 0753 12/29/2019 0818 12/03/2019 1925 01/02/20 0119 01/02/20 0446  WBC 7.7  --  16.4*  --  17.4*  RBC 3.91  --  3.62*  --  3.42*  HGB 11.5*    < > 10.6* 11.2* 10.2*  HCT 37.1   < > 35.4* 33.0* 32.8*  MCV 94.9  --  97.8  --  95.9  MCH 29.4  --  29.3  --  29.8  MCHC 31.0  --  29.9*  --  31.1  RDW 15.7*  --  15.6*  --  15.4  PLT 220  --  257  --  249   < > = values in this interval not displayed.    BNP Recent Labs  Lab 12/17/2019 0753  BNP 1,562.2*     DDimer No results for input(s): DDIMER in the last 168 hours.   Radiology    CT Angio Chest PE W and/or Wo Contrast  Result Date: 12/18/2019 CLINICAL DATA:  Shortness of breath. EXAM: CT ANGIOGRAPHY CHEST WITH CONTRAST TECHNIQUE: Multidetector CT imaging of the chest was performed using the standard protocol during bolus administration of intravenous contrast. Multiplanar CT image reconstructions and MIPs were obtained to evaluate the vascular anatomy. CONTRAST:  61m OMNIPAQUE IOHEXOL 350 MG/ML SOLN COMPARISON:  Chest CT dated 04/20/2018 FINDINGS: Cardiovascular: No pulmonary emboli. Aortic atherosclerosis. Extensive coronary artery calcifications. Borderline cardiomegaly. No pericardial effusion. 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 Thyroid gland esophagus are normal. No hilar or mediastinal. Lungs/Pleura: The patient has extensive bilateral pulmonary infiltrates as well as small bilateral pleural effusions, right greater than left. No pulmonary edema. Upper Abdomen: Normal. Musculoskeletal: There is a fluid collection in the left anterolateral chest wall measuring 9.5 by 3.1 by 7.4 cm. Patient has had bilateral mastectomies. This may represent a postoperative seroma. Was not present on the prior study. No bone abnormality. Review of the MIP images confirms the above findings. IMPRESSION: 1. No pulmonary emboli. 2. Extensive bilateral pulmonary infiltrates with  small bilateral pleural effusions, right greater than left. 3. Tracheomalacia, new since the prior study. 4. Fluid collection in the left anterolateral chest wall, probably a  postoperative seroma. Aortic Atherosclerosis (ICD10-I70.0). Electronically Signed   By: Lorriane Shire M.D.   On: 12/07/2019 12:27   DG CHEST PORT 1 VIEW  Result Date: 12/08/2019 CLINICAL DATA:  ETT placement EXAM: PORTABLE CHEST 1 VIEW COMPARISON:  12/11/2019 FINDINGS: Endotracheal tube tip is about 3 cm superior to the carina. Left IJ central venous catheter tip over the SVC. No pneumothorax is seen. Esophageal tube tip in the left upper quadrant. Cardiomegaly with bilateral ground-glass opacity and consolidations, slightly improved. Clips within both axilla IMPRESSION: 1. Endotracheal tube tip about 3 cm superior to carina 2. Left IJ central venous catheter tip over the SVC, no pneumothorax. 3. Continued cardiomegaly and central vascular congestion. Fairly extensive bilateral ground-glass opacities/probable edema though with some improvement in aeration compared to earlier radiograph. More confluent consolidation at the right greater than left base could reflect pneumonia. Electronically Signed   By: Donavan Foil M.D.   On: 12/30/2019 19:15   DG Chest Portable 1 View  Result Date: 12/30/2019 CLINICAL DATA:  Intubated, shortness of breath EXAM: PORTABLE CHEST 1 VIEW COMPARISON:  12/15/2019 FINDINGS: Single frontal view of the chest demonstrates endotracheal tube overlying tracheal air column tip at level of thoracic inlet. Enteric catheter passes below diaphragm tip excluded by collimation. Cardiac silhouette is enlarged. There is worsening bilateral airspace disease in a perihilar distribution. The pleural effusion seen on chest CT are less apparent by radiograph. No pneumothorax. No acute bony abnormalities. IMPRESSION: 1. Support devices as above. 2. Findings consistent with progressive congestive heart failure. Electronically Signed   By: Randa Ngo M.D.   On: 12/30/2019 17:35   DG Chest Port 1 View  Result Date: 12/06/2019 CLINICAL DATA:  Shortness of breath and cough EXAM: PORTABLE CHEST 1  VIEW COMPARISON:  June 10, 2019 FINDINGS: There is cardiomegaly with pulmonary venous hypertension. There is mild interstitial edema. There is bibasilar atelectasis. There is no frank consolidation. No adenopathy. Postoperative change noted in each axillary region. There is degenerative change in each shoulder. IMPRESSION: Cardiomegaly with pulmonary vascular congestion. Mild interstitial edema. Suspect a degree of underlying congestive heart failure. Bibasilar atelectasis. No consolidation. Electronically Signed   By: Lowella Grip III M.D.   On: 12/06/2019 08:38   ECHOCARDIOGRAM COMPLETE  Result Date: 01/02/2020    ECHOCARDIOGRAM REPORT   Patient Name:   [name] Date of Exam: 01/02/2020 Medical Rec #:  [id number]       Height:       61.0 in Accession #:    [id number]      Weight:       200.0 lb Date of Birth:  Feb 19, 1951       BSA:          1.889 m Patient Age:    39 years        BP:           106/73 mmHg Patient Gender: F               HR:           98 bpm. Exam Location:  Inpatient Procedure: 2D Echo, Color Doppler, Cardiac Doppler and Intracardiac            Opacification Agent STAT ECHO Indications:    R06.9 DOE  History:        Patient has prior  history of Echocardiogram examinations, most                 recent 06/11/2019. CHF, CAD; Risk Factors:Hypertension, Diabetes,                 Dyslipidemia and Sleep Apnea. Breast Cancer.  Sonographer:    Raquel Sarna Senior RDCS Referring Phys: [id number] DAN FLOYD  Sonographer Comments: Technically difficult due to mastectomy and patient sitting upright on bipap. IMPRESSIONS  1. Left ventricular ejection fraction, by estimation, is 25 to 30%. The left ventricle has severely decreased function. The left ventricle demonstrates global hypokinesis. The left ventricular internal cavity size was mildly dilated. Left ventricular diastolic parameters are indeterminate. Elevated left ventricular end-diastolic pressure. There is severe akinesis of the left ventricular,  entire inferior wall, inferoseptal wall and inferolateral wall.  2. Right ventricular systolic function is mildly reduced. The right ventricular size is normal. There is moderately elevated pulmonary artery systolic pressure.  3. The mitral valve is abnormal. Mild to moderate mitral valve regurgitation.  4. The aortic valve is tricuspid. Aortic valve regurgitation is trivial. Mild aortic valve sclerosis is present, with no evidence of aortic valve stenosis.  5. The inferior vena cava is dilated in size with <50% respiratory variability, suggesting right atrial pressure of 15 mmHg. Comparison(s): 06/05/2019: LVEF 45-50%, global hypokinesis. FINDINGS  Left Ventricle: Left ventricular ejection fraction, by estimation, is 25 to 30%. The left ventricle has severely decreased function. The left ventricle demonstrates global hypokinesis. Severe akinesis of the left ventricular, entire inferior wall, inferoseptal wall and inferolateral wall. Definity contrast agent was given IV to delineate the left ventricular endocardial borders. The left ventricular internal cavity size was mildly dilated. There is no left ventricular hypertrophy. Left ventricular  diastolic parameters are indeterminate. Elevated left ventricular end-diastolic pressure. Right Ventricle: The right ventricular size is normal. No increase in right ventricular wall thickness. Right ventricular systolic function is mildly reduced. There is moderately elevated pulmonary artery systolic pressure. The tricuspid regurgitant velocity is 2.80 m/s, and with an assumed right atrial pressure of 15 mmHg, the estimated right ventricular systolic pressure is 24.4 mmHg. Left Atrium: Left atrial size was normal in size. Right Atrium: Right atrial size was normal in size. Pericardium: There is no evidence of pericardial effusion. Mitral Valve: The mitral valve is abnormal. There is mild thickening of the mitral valve leaflet(s). Mild to moderate mitral valve regurgitation.  Tricuspid Valve: The tricuspid valve is grossly normal. Tricuspid valve regurgitation is trivial. Aortic Valve: The aortic valve is tricuspid. Aortic valve regurgitation is trivial. Mild aortic valve sclerosis is present, with no evidence of aortic valve stenosis. Pulmonic Valve: The pulmonic valve was grossly normal. Pulmonic valve regurgitation is trivial. Aorta: The aortic root and ascending aorta are structurally normal, with no evidence of dilitation. Venous: The inferior vena cava is dilated in size with less than 50% respiratory variability, suggesting right atrial pressure of 15 mmHg. IAS/Shunts: No atrial level shunt detected by color flow Doppler.  LEFT VENTRICLE PLAX 2D LVIDd:         5.80 cm  Diastology LVIDs:         5.40 cm  LV e' lateral:   5.22 cm/s LV PW:         0.80 cm  LV E/e' lateral: 18.0 LV IVS:        0.50 cm  LV e' medial:    4.90 cm/s LVOT diam:     2.00 cm  LV E/e' medial:  19.1  LV SV:         35 LV SV Index:   18 LVOT Area:     3.14 cm  RIGHT VENTRICLE RV S prime:     8.16 cm/s TAPSE (M-mode): 1.0 cm LEFT ATRIUM           Index       RIGHT ATRIUM           Index LA diam:      3.30 cm 1.75 cm/m  RA Area:     11.40 cm LA Vol (A4C): 41.3 ml 21.87 ml/m RA Volume:   23.50 ml  12.44 ml/m  AORTIC VALVE LVOT Vmax:   59.90 cm/s LVOT Vmean:  42.500 cm/s LVOT VTI:    0.110 m  AORTA Ao Root diam: 3.00 cm MITRAL VALVE               TRICUSPID VALVE MV Area (PHT): 4.89 cm    TR Peak grad:   31.4 mmHg MV Decel Time: 155 msec    TR Vmax:        280.00 cm/s MV E velocity: 93.80 cm/s MV A velocity: 24.20 cm/s  SHUNTS MV E/A ratio:  3.88        Systemic VTI:  0.11 m                            Systemic Diam: 2.00 cm Lyman Bishop MD Electronically signed by Lyman Bishop MD Signature Date/Time: 12/29/2019/2:47:35 PM    Final     Cardiac Studies     Patient Profile     69 y.o. female with severe 3 V cad admitted with repsiratory failure     Assessment & Plan    1.  CAD:  Difficult to tell  if this respiratory failure was due to closure of her LCX or just progressive CHF.  Cont supportive care.    I have reviewed her previous cath with Dr. Tamala Julian who does not think repeat attempts at PCI of her L:Cx would be successful.  Cont current support. Ive discussed with her daughters.   2.  chf :  Cont supportive care.   3.  HTN:       bp is on the low side currently .  Will continue to follow       For questions or updates, please contact [place] Please consult [email or web address] for contact info under        Signed, Mertie Moores, MD  01/02/2020, 2:41 PM

## 2020-01-02 NOTE — Progress Notes (Signed)
Last blood glucose was 115. Insulin gtt stopped per MD verbal order.

## 2020-01-02 NOTE — Progress Notes (Signed)
[facility] Progress Note Patient Name: [name] DOB: 06-Apr-1951 MRN: [id number]   Date of Service  01/02/2020  HPI/Events of Note  Bright red blood from ETT and oozing from central line site.   eICU Interventions  Will order: 1. Hold Heparin IV infusion until patient is evaluated by rounding team in AM.      Intervention Category Major Interventions: Other:  Lysle Dingwall 01/02/2020, 5:01 AM

## 2020-01-02 NOTE — Progress Notes (Signed)
NAME:  [name], MRN:  [id number], DOB:  1950-12-22, LOS: 1 ADMISSION DATE:  12/10/2019, CONSULTATION DATE:  01/02/20 REFERRING MD: Noemi Chapel MD CHIEF COMPLAINT:  Cardiac arrest   Brief History   Patient is a 69 yr old female with PMHx of three vessel CAD, diabetes, HFpEF, HTN, breast cancer s/p radiation, obesity who presented with four days of dyspnea. Patient admitted with cardiogenic shock in setting of worsening CHF requiring intubation with brief PEA arrest with ROSC with brief CPR and 1 dose epinephrine. PCCM to admit  History of present illness   History obtained via chart review and family at bedside as patient is sedated and mechanically ventilated.  Patient is a 69yr old female with PMHx of three vessel CAD, HFpEF (EF 25-30%), HTN, breast cancer s/p radiation, obesity who presented with four day history of shortness of breath. Patient also noted three day history of chest pain unrelieved by nitroglycerin with associated left arm pain in setting of exertion with associated diaphoresis. Pain subsided with rest. Patient also noted to have lower extremity edema, abdominal bloating and decreased urine output despite medication compliance.   Patient initially found to be significantly hypoxic by EMS requiring NRB en route with improvement of O2 sats to low 90s. Patient improved with breathing treatments with EMS. She continued to have difficulty breathing despite receiving magnesium and solumedrol in the ED. Patient started on BiPAP. CXR with possible fluid overload with elevated BNP and troponins. Patient given small dose of lasix and aspirin. CT chest with bilateral infiltrates concerning for possible PNA, however negative for PE. In the ED, patient became gradually more obtunded and unresponsive on BiPAP requiring intubation. During intubation, patient had brief bradycardic PEA arrest lasting <5 minutes with brief CPR and 1 dose epinephrine with ROSC. Patient admitted to [facility].   Past  Medical History   Past Medical History:  Diagnosis Date  . Arthritis    "knees, lower back" (08/18/2017)  . 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 S/p lumpectomy and radiation.  Marland Kitchen CAD (coronary artery disease) 11/07   cath 11/11 :  3V CADsee report   . Cataract    small both eyes  . Chronic lower back pain   . Chronic pain    MR 08/07/10 :  Spondylosis L4-5 with B foraminal narrowing and L4 nerve root enchroachment B.  . Chronic venous insufficiency 2012   Has had full W/U incl ECHO, LFT's, Cr, Umicro, TSH, BNP. Symptomatic treatment.  . Colonic polyp 1995   Colonoscopies 1994, 2000, and 02/02/2011. Last had 9 polyps - Tubular adenoma and tubulovillous was the pathology results without high grade dysplasia  . Diabetes mellitus type II    Insulin dependent. Has worked with [name] R previously.  . Diastolic CHF, chronic ([place])    NL EF by echo 01/2012, Gr I dd  . Essential hypertension    Requires 5 drug therapy and still difficult to control.  Marland Kitchen GERD (gastroesophageal reflux disease)   . Heart murmur   . History of radiation therapy 10/02/18- 11/09/18   Left chest wall SCV, Left chest wall boost, Right chest wall SCV, right chest boost all received 28 fractions for a total dose of 50.04 Gy  . Hyperlipidemia    On daily statin  . Iron deficiency anemia    Ferritin was 12 in 2012. on FeSo4. Has had colon and EGD 02/02/11 that showed mild gastritis and colon polyps.  Marland Kitchen  Obesity    Morbid. HAs worked with [name] T previously.  . OSA (obstructive sleep apnea) 02/16/2007   02/2007 : Moderate AHI 35.6, O2 sat decreased to 65%, CPAP titration to 16 with AHI of 3.6. 02/2014 : Rare respiratory events with sleep disturbance, within normal limits. AHI 0.4 per hour      . OSA on CPAP   . Personal history of chemotherapy   . Personal history of radiation therapy    "to my left breast"  . Refusal of blood transfusions as patient is Jehovah's Witness   .  Seasonal allergies    "grass pollen"   Significant Hospital Events   3/30 - admit to ICU s/p cardiac arrest  Consults:  PCCM Cardiology  Procedures:  ETT 3/30>>  Significant Diagnostic Tests:  3/30 CTA Chest PE >> No PE, extensive bilateral pulmonary infiltrates w/bilateral pleural effusions R>L, tracheomalacia, postoperative seroma  3/30 Echo >> LVEF 25-30%, global hypokinesis, elevated LVEDP, severe akinesis of LV, inferior, inferoseptal and inferolateral wall; moderately elevated pulmonary artery systolic pressure; mild to moderate elevated MV regurgitation  Micro Data:  3/30 SARS COVID-19, Influenza A/B >> negative   Antimicrobials:  Vancomycin x1 3/30 Zosyn x1 3/30  Interim history/subjective:  Norepinephrine currently at 30 Propofol 25 Insulin infusion running I/O+ 1.5 L for the hospitalization, furosemide ordered 3/30 and overnight Slight improvement serum creatinine Heparin stopped overnight, has had oozing from CVC site and OG tube.  Hemoglobin stable, 10.2   Objective   Blood pressure 122/75, pulse (!) 101, temperature 99.3 F (37.4 C), resp. rate (!) 25, height 5\' 1"  (1.549 m), weight 95.6 kg, last menstrual period 07/12/1984, SpO2 95 %. CVP:  [13 mmHg-16 mmHg] 13 mmHg  Vent Mode: PRVC FiO2 (%):  [40 %-100 %] 70 % Set Rate:  [20 bmp-28 bmp] 20 bmp Vt Set:  [380 mL] 380 mL PEEP:  [10 cmH20-15 cmH20] 10 cmH20 Plateau Pressure:  [15 cmH20-32 cmH20] 15 cmH20   Intake/Output Summary (Last 24 hours) at 01/02/2020 [id number] Last data filed at 01/02/2020 0800 Gross per 24 hour  Intake 2675.23 ml  Output 1135 ml  Net 1540.23 ml   Filed Weights   12/03/2019 0750 01/02/20 0518  Weight: 90.7 kg 95.6 kg    Examination: General: Critically ill-appearing woman, intubated, sedated HENT: ET tube in good position, oropharynx clear, no secretions, pupils equal Lungs: Bilateral inspiratory crackles, no wheezing Cardiovascular: Regular, borderline tachycardic, 2/6 systolic  murmur Abdomen: Slightly protuberant, nontender, positive bowel sounds Extremities: 1+ pitting edema bilateral lower extremities Neuro: grimace with stimulation, opens eyes briefly then back to sleep, not following commands  Resolved Hospital Problem list     Assessment & Plan:  Acute hypoxic and hypercarbic respiratory failure requiring mechanical ventilation, due to bilateral infiltrates, presumed cardiogenic pulmonary edema Antibiotics discontinued on 3/30, following clinically given suspicion that this is cardiogenic, not infection Continue PRVC 8 cc/kg, 0.70, PEEP 10.  Wean FiO2 as able, then start weaning PEEP Follow chest x-ray Diuresis as she can tolerate, Lasix ordered  Cardiogenic shock in setting or worsening HFrEF NSTEMI, Cardiac arrest  Hx three-vessel CAD Patient presented with worsening HFrEF, noted to have severely reduced EF of 25-30% (previously 45-50% in September 2020). She has history of severe CAD s/p stent in 06/2019. Patient not good candidate for CABG due to anemia and refusal of blood products as she is Jehovah's witness. On admission, noted to have elevated BNP and elevated troponins. EKG with nonspecific changes.She suffered brief cardiac arrest during intubation.  Heparin  held overnight given blood in G-tube, oozing from CVC site. Continue norepinephrine, wean as able Follow CVP, volume status with diuresis She may benefit from inotropes, will discuss with cardiology.  Follow cooximetry Likely will require reevaluation of her CAD once stabilized from this acute event.  Acute kidney injury: Patient's baseline sCr 0.9-1.0; Noted to have sCr 1.43 in setting of cardiogenic shock  Follow BMP, urine output, especially with active diuresis Ensure adequate renal perfusion, avoid nephrotoxins and dose medications for renal function  Acute encephalopathy 2/2 respiratory failure and cardiogenic shock  Patient currently sedated with propofol and fentanyl.  RASS goal -1  to -2 until MVA needs improved   Acute blood loss - oozing CVC site, GT > possible GI bleed Follow serial CBC off heparin May need formal GI eval depending on persistence, CBC trend  Diabetes mellitus:  Patient's A1c 8.6. She is on metformin at home. Converted to insulin drip on 3/30 overnight.  Continue for now, goal to wean and convert to sliding scale when feasible  Hx Hypertension: Patient's home antihypertensives held in setting of cardiogenic shock   Best practice:  Diet: NPO Pain/Anxiety/Delirium protocol (if indicated): fentanyl and precedex gtt VAP protocol (if indicated): per protocol DVT prophylaxis: heparin subQ + SCDs GI prophylaxis: PPI Glucose control: SSI Mobility: Bed bound Code Status: Full code Family Communication: spoke with the patient's daughters at bedside 3/31.  Disposition: ICU   Labs   CBC: Recent Labs  Lab 12/22/2019 0753 12/18/2019 0818 12/15/2019 1124 12/23/2019 1652 12/31/2019 1925 01/02/20 0119 01/02/20 0446  WBC 7.7  --   --   --  16.4*  --  17.4*  NEUTROABS 5.5  --   --   --   --   --   --   HGB 11.5*   < > 12.2 14.6 10.6* 11.2* 10.2*  HCT 37.1   < > 36.0 43.0 35.4* 33.0* 32.8*  MCV 94.9  --   --   --  97.8  --  95.9  PLT 220  --   --   --  257  --  249   < > = values in this interval not displayed.    Basic Metabolic Panel: Recent Labs  Lab 12/31/2019 0753 12/11/2019 0753 12/03/2019 0818 12/05/2019 1124 12/09/2019 1652 12/30/2019 1922 01/02/20 0119 01/02/20 0446  NA 138   < > 139 136 135  --  138 137  K 4.1   < > 4.0 4.4 4.9  --  4.0 3.6  CL 102  --   --   --   --   --   --  105  CO2 23  --   --   --   --   --   --  18*  GLUCOSE 245*  --   --   --   --   --   --  264*  BUN 29*  --   --   --   --   --   --  40*  CREATININE 1.43*  --   --   --   --  1.97*  --  1.83*  CALCIUM 9.2  --   --   --   --   --   --  8.1*   < > = values in this interval not displayed.   GFR: Estimated Creatinine Clearance: 31.1 mL/min (A) (by C-G formula based on  SCr of 1.83 mg/dL (H)). Recent Labs  Lab 12/22/2019 0753 12/28/2019 1520 12/17/2019 1925 12/31/2019  1928 12/29/2019 2201 01/02/20 0446  WBC 7.7  --  16.4*  --   --  17.4*  LATICACIDVEN  --  3.8*  --  7.9* 5.2*  --     Liver Function Tests: Recent Labs  Lab 12/18/2019 0753 01/02/20 0446  AST 43* 212*  ALT 26 166*  ALKPHOS 68 61  BILITOT 0.3 0.2*  PROT 7.7 6.8  ALBUMIN 3.5 3.0*   No results for input(s): LIPASE, AMYLASE in the last 168 hours. No results for input(s): AMMONIA in the last 168 hours.  ABG    Component Value Date/Time   PHART 7.320 (L) 01/02/2020 0119   PCO2ART 34.6 01/02/2020 0119   PO2ART 308.0 (H) 01/02/2020 0119   HCO3 17.9 (L) 01/02/2020 0119   TCO2 19 (L) 01/02/2020 0119   ACIDBASEDEF 8.0 (H) 01/02/2020 0119   O2SAT 100.0 01/02/2020 0119     Coagulation Profile: No results for input(s): INR, PROTIME in the last 168 hours.  Cardiac Enzymes: No results for input(s): CKTOTAL, CKMB, CKMBINDEX, TROPONINI in the last 168 hours.  HbA1C: Hemoglobin A1C  Date/Time Value Ref Range Status  11/29/2019 10:19 AM 8.7 (A) 4.0 - 5.6 % Final  05/31/2019 10:04 AM 8.1 (A) 4.0 - 5.6 % Final   Hgb A1c MFr Bld  Date/Time Value Ref Range Status  12/15/2019 03:20 PM 8.6 (H) 4.8 - 5.6 % Final    Comment:    (NOTE) Pre diabetes:          5.7%-6.4% Diabetes:              >6.4% Glycemic control for   <7.0% adults with diabetes   07/13/2018 01:25 PM 8.9 (H) 4.8 - 5.6 % Final    Comment:    (NOTE) Pre diabetes:          5.7%-6.4% Diabetes:              >6.4% Glycemic control for   <7.0% adults with diabetes     CBG: Recent Labs  Lab 01/02/20 0331 01/02/20 0503 01/02/20 0632 01/02/20 0740 01/02/20 0815  GLUCAP 250* 258* 229* 95 199*    Critical care time: 33 minutes     Baltazar Apo, MD, PhD 01/02/2020, 9:19 AM [facility] [phone] or if no answer [phone]

## 2020-01-02 NOTE — Progress Notes (Signed)
[place] Progress Note Patient Name: [name] DOB: 1950-12-12 MRN: [id number]   Date of Service  01/02/2020  HPI/Events of Note  ABG on 100%/PRVC 20/TV 380/P 15 = 7.32/34.6/308.  eICU Interventions  Plan: 1. Continue present ventilator management.  2. Wean FiO2 as tolerated.      Intervention Category Major Interventions: Acid-Base disturbance - evaluation and management;Respiratory failure - evaluation and management  Shaughnessy Gethers [place] 01/02/2020, 1:25 AM

## 2020-01-02 NOTE — Progress Notes (Signed)
Patients heparin paused due to frank flood in the OG tube. E-link and pharmacy notified of patients status. Awaiting orders, will continue to monitor.

## 2020-01-02 NOTE — Progress Notes (Signed)
Heparin held per Dr. Oletta Darter order.

## 2020-01-02 NOTE — Progress Notes (Signed)
Inpatient Diabetes Program Recommendations  AACE/ADA: New Consensus Statement on Inpatient Glycemic Control (2015)  Target Ranges:  Prepandial:   less than 140 mg/dL      Peak postprandial:   less than 180 mg/dL (1-2 hours)      Critically ill patients:  140 - 180 mg/dL   Lab Results  Component Value Date   GLUCAP 158 (H) 01/02/2020   HGBA1C 8.6 (H) 12/21/2019    Review of Glycemic Control Results for [name], [name] (MRN [id number]) as of 01/02/2020 13:34  Ref. Range 01/02/2020 08:15 01/02/2020 09:21 01/02/2020 10:18 01/02/2020 11:38  Glucose-Capillary Latest Ref Range: 70 - 99 mg/dL 199 (H) 181 (H) 118 (H) 158 (H)   Diabetes history: Type 2 DM Outpatient Diabetes medications: Novolog 8 units QAM/ 10 units QPM, Metformin 1000 mg BID, Toujeo 35 units QD Current orders for Inpatient glycemic control: IV insulin   Inpatient Diabetes Program Recommendations:    Recommending continuing with insulin drip for now as drip rates exceed 4 units/hr, making transition recommendations difficult. Patient has received >90 units since midnight.   @1430 : Checked back in and noticed patient IV insulin was stopped per verbal order from Dr Lamonte Sakai. Verified by RN. Encouraged RN to check a blood sugar to assess and to help assist with further recommendations.  Dr Lamonte Sakai notified and placing orders.   Following.  Thanks, Bronson Curb, MSN, RNC-OB Diabetes Coordinator [phone] (8a-5p)

## 2020-01-02 NOTE — Progress Notes (Signed)
Rt attempted to place arterial line.  Unable to get arterial line at this time.  Called [facility] and advised.

## 2020-01-02 NOTE — Progress Notes (Signed)
[place] for heparin Indication: chest pain/ACS  Allergies  Allergen Reactions  . Blood-Group Specific Substance Other (See Comments)    NO BLOOD - JEHOVAH'S WITNESS  . Lisinopril Other (See Comments)    Chronic cough secondary to ACE  . Oxycodone-Acetaminophen Hives, Itching and Other (See Comments)    flushing  . Percocet [Oxycodone-Acetaminophen] Hives  . Victoza [Liraglutide] Other (See Comments)    Made her feel "out of it"  . Atorvastatin Other (See Comments)    myalgias    Patient Measurements: Height: 5\' 1"  (154.9 cm) Weight: 200 lb (90.7 kg) IBW/kg (Calculated) : 47.8 Heparin Dosing Weight: 69kg  Vital Signs: Temp: 99 F (37.2 C) (03/31 0030) Temp Source: Oral (03/31 0006) BP: 110/82 (03/30 2330) Pulse Rate: 89 (03/31 0030)  Labs: Recent Labs    12/10/2019 0753 12/28/2019 0818 12/17/2019 1008 12/12/2019 1124 12/05/2019 1124 12/21/2019 1652 12/21/2019 1922 12/15/2019 1925 12/05/2019 2103 12/31/2019 2300  HGB 11.5*   < >  --  12.2   < > 14.6  --  10.6*  --   --   HCT 37.1   < >  --  36.0  --  43.0  --  35.4*  --   --   PLT 220  --   --   --   --   --   --  257  --   --   HEPARINUNFRC  --   --   --   --   --   --   --   --  0.98* 1.22*  CREATININE 1.43*  --   --   --   --   --  1.97*  --   --   --   TROPONINIHS 3,076*   < > 3,427*  --   --   --  [id number]*  --  3,906*  --    < > = values in this interval not displayed.    Estimated Creatinine Clearance: 28 mL/min (A) (by C-G formula based on SCr of 1.97 mg/dL (H)).   Medical History: Past Medical History:  Diagnosis Date  . Arthritis    "knees, lower back" (08/18/2017)  . 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 S/p lumpectomy and radiation.  Marland Kitchen CAD (coronary artery disease) 11/07   cath 11/11 :  3V CADsee report   . Cataract    small both eyes  . Chronic lower back pain   . Chronic pain    MR 08/07/10 :  Spondylosis L4-5 with B  foraminal narrowing and L4 nerve root enchroachment B.  . Chronic venous insufficiency 2012   Has had full W/U incl ECHO, LFT's, Cr, Umicro, TSH, BNP. Symptomatic treatment.  . Colonic polyp 1995   Colonoscopies 1994, 2000, and 02/02/2011. Last had 9 polyps - Tubular adenoma and tubulovillous was the pathology results without high grade dysplasia  . Diabetes mellitus type II    Insulin dependent. Has worked with [name] R previously.  . Diastolic CHF, chronic ([place])    NL EF by echo 01/2012, Gr I dd  . Essential hypertension    Requires 5 drug therapy and still difficult to control.  Marland Kitchen GERD (gastroesophageal reflux disease)   . Heart murmur   . History of radiation therapy 10/02/18- 11/09/18   Left chest wall SCV, Left chest wall boost, Right chest wall SCV, right chest boost all received 28 fractions for a total dose of 50.04  Gy  . Hyperlipidemia    On daily statin  . Iron deficiency anemia    Ferritin was 12 in 2012. on FeSo4. Has had colon and EGD 02/02/11 that showed mild gastritis and colon polyps.  . Obesity    Morbid. HAs worked with [name] T previously.  . OSA (obstructive sleep apnea) 02/16/2007   02/2007 : Moderate AHI 35.6, O2 sat decreased to 65%, CPAP titration to 16 with AHI of 3.6. 02/2014 : Rare respiratory events with sleep disturbance, within normal limits. AHI 0.4 per hour      . OSA on CPAP   . Personal history of chemotherapy   . Personal history of radiation therapy    "to my left breast"  . Refusal of blood transfusions as patient is Jehovah's Witness   . Seasonal allergies    "grass pollen"    Medications:  Infusions:  . sodium chloride 10 mL/hr at 12/07/2019 2250  . sodium chloride 1,000 mL (12/31/2019 2210)  . dextrose 5 % and 0.45% NaCl    . fentaNYL infusion INTRAVENOUS    . heparin    . insulin 13 Units/hr (12/28/2019 2352)  . norepinephrine (LEVOPHED) Adult infusion 34 mcg/min (12/26/2019 2311)  . propofol (DIPRIVAN) infusion 27.78 mcg/kg/min (12/24/2019 2301)     Assessment: 36 yof presented to the ED with SOB. Troponin elevated and now starting IV heparin. Hgb is slightly low and platelets are WNL. She is not on anticoagulation PTA.  Initial heparin level resulted high at 0.98. Heparin infusing in L AC, level drawn from central line per RN. Will get stat repeat heparin level to confirm. No issues with infusion per discussion with RN. RN does report bleeding from central line earlier today, currently resolved with sandbag applied. She will notify Rx if worsens.  Repeat heparin level elevated at 1.22  Goal of Therapy:  Heparin level 0.3-0.7 units/ml Monitor platelets by anticoagulation protocol: Yes   Plan:  Hold heparin for 1 hour Restart at 1200 units / hr 6 hour heparin level Monitor daily CBC, s/sx bleeding  Thank you Anette Guarneri, PharmD   01/02/2020 12:35 AM

## 2020-01-03 ENCOUNTER — Inpatient Hospital Stay (HOSPITAL_COMMUNITY): Payer: Medicare Other

## 2020-01-03 LAB — BASIC METABOLIC PANEL
Anion gap: 12 (ref 5–15)
BUN: 45 mg/dL — ABNORMAL HIGH (ref 8–23)
CO2: 24 mmol/L (ref 22–32)
Calcium: 8.5 mg/dL — ABNORMAL LOW (ref 8.9–10.3)
Chloride: 103 mmol/L (ref 98–111)
Creatinine, Ser: 1.43 mg/dL — ABNORMAL HIGH (ref 0.44–1.00)
GFR calc Af Amer: 44 mL/min — ABNORMAL LOW (ref >60)
GFR calc non Af Amer: 38 mL/min — ABNORMAL LOW (ref >60)
Glucose, Bld: 235 mg/dL — ABNORMAL HIGH (ref 70–99)
Potassium: 4.1 mmol/L (ref 3.5–5.1)
Sodium: 139 mmol/L (ref 135–145)

## 2020-01-03 LAB — GLUCOSE, CAPILLARY
Glucose-Capillary: 213 mg/dL — ABNORMAL HIGH (ref 70–99)
Glucose-Capillary: 200 mg/dL — ABNORMAL HIGH (ref 70–99)
Glucose-Capillary: 168 mg/dL — ABNORMAL HIGH (ref 70–99)
Glucose-Capillary: 161 mg/dL — ABNORMAL HIGH (ref 70–99)
Glucose-Capillary: 194 mg/dL — ABNORMAL HIGH (ref 70–99)

## 2020-01-03 LAB — CBC
HCT: 29.8 % — ABNORMAL LOW (ref 36.0–46.0)
Hemoglobin: 9.4 g/dL — ABNORMAL LOW (ref 12.0–15.0)
MCH: 29.6 pg (ref 26.0–34.0)
MCHC: 31.5 g/dL (ref 30.0–36.0)
MCV: 93.7 fL (ref 80.0–100.0)
Platelets: 209 10*3/uL (ref 150–400)
RBC: 3.18 MIL/uL — ABNORMAL LOW (ref 3.87–5.11)
RDW: 15.4 % (ref 11.5–15.5)
WBC: 14.5 10*3/uL — ABNORMAL HIGH (ref 4.0–10.5)
nRBC: 0.2 % (ref 0.0–0.2)

## 2020-01-03 LAB — MAGNESIUM: Magnesium: 2.3 mg/dL (ref 1.7–2.4)

## 2020-01-03 LAB — LACTIC ACID, PLASMA: Lactic Acid, Venous: 1.6 mmol/L (ref 0.5–1.9)

## 2020-01-03 MED ORDER — SODIUM CHLORIDE 0.9 % IV SOLN
3.0000 g | Freq: Four times a day (QID) | INTRAVENOUS | Status: DC
  Administered 2020-01-03 – 2020-01-05 (×8): 3 g via INTRAVENOUS
  Filled 2020-01-03 (×8): qty 8

## 2020-01-03 MED ORDER — DEXMEDETOMIDINE HCL IN NACL 400 MCG/100ML IV SOLN
0.0000 ug/kg/h | INTRAVENOUS | Status: DC
  Administered 2020-01-03: 0.4 ug/kg/h via INTRAVENOUS
  Administered 2020-01-03 – 2020-01-04 (×4): 1.2 ug/kg/h via INTRAVENOUS
  Administered 2020-01-04: 2 ug/kg/h via INTRAVENOUS
  Administered 2020-01-04: 1.2 ug/kg/h via INTRAVENOUS
  Administered 2020-01-04: 2 ug/kg/h via INTRAVENOUS
  Administered 2020-01-04: 04:00:00 1.2 ug/kg/h via INTRAVENOUS
  Administered 2020-01-05: 07:00:00 2 ug/kg/h via INTRAVENOUS
  Administered 2020-01-05: 05:00:00 1.7 ug/kg/h via INTRAVENOUS
  Administered 2020-01-05: 1.8 ug/kg/h via INTRAVENOUS
  Filled 2020-01-03 (×2): qty 100
  Filled 2020-01-03: qty 200
  Filled 2020-01-03: qty 100
  Filled 2020-01-03: qty 200
  Filled 2020-01-03: qty 100
  Filled 2020-01-03: qty 200
  Filled 2020-01-03: qty 100
  Filled 2020-01-03: qty 200

## 2020-01-03 MED ORDER — MIDAZOLAM HCL 2 MG/2ML IJ SOLN
INTRAMUSCULAR | Status: AC
  Administered 2020-01-03: 4 mg via INTRAVENOUS
  Filled 2020-01-03: qty 4

## 2020-01-03 MED ORDER — MIDAZOLAM HCL 2 MG/2ML IJ SOLN: 4.0000 mg | Freq: Once | INTRAMUSCULAR | Status: AC

## 2020-01-03 MED ORDER — ACETAMINOPHEN 650 MG RE SUPP
650.0000 mg | RECTAL | Status: DC | PRN
  Administered 2020-01-04: 01:00:00 650 mg via RECTAL
  Filled 2020-01-03 (×2): qty 1

## 2020-01-03 MED ORDER — FUROSEMIDE 10 MG/ML IJ SOLN
40.0000 mg | Freq: Two times a day (BID) | INTRAMUSCULAR | Status: DC
  Administered 2020-01-03 – 2020-01-05 (×4): 40 mg via INTRAVENOUS
  Filled 2020-01-03 (×5): qty 4

## 2020-01-03 NOTE — Progress Notes (Addendum)
Pharmacy Antibiotic Note  [name] is a 69 y.o. female admitted on 12/16/2019 with SOB, s/p brief PEA arrest.  CXR showing improvement in B/L pulmonary infiltrates.  Pharmacy has been consulted for Unasyn dosing for PNA.  Renal function is improving - Tmax 100.6, WBC down 14.5, LA 1.6.  Plan: Unasyn 3gm IV Q6H Monitor renal fxn, clinical progress  Height: 5\' 1"  (154.9 cm) Weight: 94.4 kg (208 lb 1.8 oz) IBW/kg (Calculated) : 47.8  Temp (24hrs), Avg:99.4 F (37.4 C), Min:98.4 F (36.9 C), Max:100.9 F (38.3 C)  Recent Labs  Lab 12/10/2019 0753 12/28/2019 1520 12/05/2019 1922 12/15/2019 1925 12/11/2019 1928 12/06/2019 2201 01/02/20 0446 01/02/20 1603 01/03/20 0350  WBC 7.7  --   --  16.4*  --   --  17.4* 15.0* 14.5*  CREATININE 1.43*  --  1.97*  --   --   --  1.83*  --  1.43*  LATICACIDVEN  --  3.8*  --   --  7.9* 5.2*  --   --  1.6    Estimated Creatinine Clearance: 39.5 mL/min (A) (by C-G formula based on SCr of 1.43 mg/dL (H)).    Allergies  Allergen Reactions  . Blood-Group Specific Substance Other (See Comments)    NO BLOOD - JEHOVAH'S WITNESS  . Lisinopril Other (See Comments)    Chronic cough secondary to ACE  . Oxycodone-Acetaminophen Hives, Itching and Other (See Comments)    flushing  . Percocet [Oxycodone-Acetaminophen] Hives  . Victoza [Liraglutide] Other (See Comments)    Made her feel "out of it"  . Atorvastatin Other (See Comments)    myalgias    Vanc/Zosyn x1 3/30 Unasyn 4/1 >>  3/30 covid / flu / MRSA PCR - negative 3/31 TA - 3/31 BCx - NGTD 4/1 BCx -   Halford Goetzke D. Mina Marble, PharmD, BCPS, [place] 01/03/2020, 10:21 AM

## 2020-01-03 NOTE — Progress Notes (Signed)
Progress Note  Patient Name: [name] Date of Encounter: 01/03/2020  Primary Cardiologist: Minus Breeding, MD    Subjective   [name] is a 69 year old female with a history of three-vessel coronary artery disease, hypertension hyperlipidemia and statin intolerance.  She has known three-vessel coronary artery disease but is not a surgical candidate given her refusal to take blood products.  She had PCI of her LAD a year ago.  She was noted to have significant disease in her circumflex artery.  Dr. Angelena Form ballooned her circumflex artery but was unable to deliver a stent.  Still on the vent.  Slow improvement  Has heme + drainage from NG tube.    Inpatient Medications    Scheduled Meds: . chlorhexidine gluconate (MEDLINE KIT)  15 mL Mouth Rinse BID  . Chlorhexidine Gluconate Cloth  6 each Topical Q0600  . fentaNYL (SUBLIMAZE) injection  25 mcg Intravenous Once  . furosemide  80 mg Intravenous BID  . insulin aspart  2-6 Units Subcutaneous Q4H  . insulin detemir  12 Units Subcutaneous Q12H  . mouth rinse  15 mL Mouth Rinse 10 times per day  . pantoprazole (PROTONIX) IV  40 mg Intravenous Q24H  . rosuvastatin  10 mg Oral Daily  . sodium chloride flush  10-40 mL Intracatheter Q12H   Continuous Infusions: . sodium chloride 10 mL/hr at 01/03/20 0800  . fentaNYL infusion INTRAVENOUS 25 mcg/hr (01/03/20 0923)  . norepinephrine (LEVOPHED) Adult infusion 7 mcg/min (01/03/20 0800)  . propofol (DIPRIVAN) infusion 40 mcg/kg/min (01/03/20 0900)   PRN Meds: Place/Maintain arterial line **AND** sodium chloride, fentaNYL, fentaNYL (SUBLIMAZE) injection, sennosides, sodium chloride flush   Vital Signs    Vitals:   01/03/20 0809 01/03/20 0850 01/03/20 0900 01/03/20 1000  BP: 101/79 (!) 105/53 106/64 (!) 107/43  Pulse: (!) 101 95 88 91  Resp: (!) _0 Temp:   99 F (37.2 C) 98.8 F (37.1 C)  TempSrc:      SpO2: 100% 100% 100% 100%  Weight:      Height:         Intake/Output Summary (Last 24 hours) at 01/03/2020 1012 Last data filed at 01/03/2020 0800 Gross per 24 hour  Intake 728.87 ml  Output 3180 ml  Net -2451.13 ml   Last 3 Weights 01/03/2020 01/02/2020 12/17/2019  Weight (lbs) 208 lb 1.8 oz 210 lb 12.2 oz 200 lb  Weight (kg) 94.4 kg 95.6 kg 90.719 kg      Telemetry    Sinus tach - Personally Reviewed  ECG     - Personally Reviewed  Physical Exam   Physical Exam: Blood pressure (!) 107/43, pulse 91, temperature 98.8 F (37.1 C), resp. rate 20, height 5' 1" (1.549 m), weight 94.4 kg, last menstrual period 07/12/1984, SpO2 100 %.  GEN:   Middle age female,   HEENT: Normal NECK: No JVD; No carotid bruits LYMPHATICS: No lymphadenopathy CARDIAC: RR  RESPIRATORY:  Clear to auscultation without rales, wheezing or rhonchi  ABDOMEN: Soft, non-tender, non-distended MUSCULOSKELETAL:  No edema; No deformity  SKIN: Warm and dry NEUROLOGIC:  Alert and oriented x 3   Labs    High Sensitivity Troponin:   Recent Labs  Lab 12/30/2019 0753 12/29/2019 1008 12/10/2019 1922 12/18/2019 2103  TROPONINIHS 3,076* 3,427* 3,446* 3,906*      Chemistry Recent Labs  Lab 01/02/2020 0753 12/17/2019 0818 12/12/2019 1652 12/27/2019 1922 01/02/20 0119 01/02/20 0446 01/03/20 0350  NA 138   < >   < >  --  138 137 139  K 4.1   < >   < >  --  4.0 3.6 4.1  CL 102  --   --   --   --  105 103  CO2 23  --   --   --   --  18* 24  GLUCOSE 245*  --   --   --   --  264* 235*  BUN 29*  --   --   --   --  40* 45*  CREATININE 1.43*   < >  --  1.97*  --  1.83* 1.43*  CALCIUM 9.2  --   --   --   --  8.1* 8.5*  PROT 7.7  --   --   --   --  6.8  --   ALBUMIN 3.5  --   --   --   --  3.0*  --   AST 43*  --   --   --   --  212*  --   ALT 26  --   --   --   --  166*  --   ALKPHOS 68  --   --   --   --  61  --   BILITOT 0.3  --   --   --   --  0.2*  --   GFRNONAA 38*   < >  --  25*  --  28* 38*  GFRAA 44*   < >  --  30*  --  32* 44*  ANIONGAP 13  --   --   --   --  14 12    < > = values in this interval not displayed.     Hematology Recent Labs  Lab 01/02/20 0446 01/02/20 1603 01/03/20 0350  WBC 17.4* 15.0* 14.5*  RBC 3.42* 3.23* 3.18*  HGB 10.2* 9.5* 9.4*  HCT 32.8* 30.2* 29.8*  MCV 95.9 93.5 93.7  MCH 29.8 29.4 29.6  MCHC 31.1 31.5 31.5  RDW 15.4 15.3 15.4  PLT 249 199 209    BNP Recent Labs  Lab 12/31/2019 0753  BNP 1,562.2*     DDimer No results for input(s): DDIMER in the last 168 hours.   Radiology    CT Angio Chest PE W and/or Wo Contrast  Result Date: 01/02/2020 CLINICAL DATA:  Shortness of breath. EXAM: CT ANGIOGRAPHY CHEST WITH CONTRAST TECHNIQUE: Multidetector CT imaging of the chest was performed using the standard protocol during bolus administration of intravenous contrast. Multiplanar CT image reconstructions and MIPs were obtained to evaluate the vascular anatomy. CONTRAST:  65mL OMNIPAQUE IOHEXOL 350 MG/ML SOLN COMPARISON:  Chest CT dated 04/20/2018 FINDINGS: Cardiovascular: No pulmonary emboli. Aortic atherosclerosis. Extensive coronary artery calcifications. Borderline cardiomegaly. No pericardial effusion. 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 Thyroid gland esophagus are normal. No hilar or mediastinal. Lungs/Pleura: The patient has extensive bilateral pulmonary infiltrates as well as small bilateral pleural effusions, right greater than left. No pulmonary edema. Upper Abdomen: Normal. Musculoskeletal: There is a fluid collection in the left anterolateral chest wall measuring 9.5 by 3.1 by 7.4 cm. Patient has had bilateral mastectomies. This may represent a postoperative seroma. Was not present on the prior study. No bone abnormality. Review of the MIP images confirms the above findings. IMPRESSION: 1. No pulmonary emboli. 2. Extensive bilateral pulmonary infiltrates with small bilateral pleural effusions, right greater than left. 3. Tracheomalacia, new since the prior study. 4. Fluid  collection   in the left anterolateral chest wall, probably a postoperative seroma. Aortic Atherosclerosis (ICD10-I70.0). Electronically Signed   By: James  Maxwell M.D.   On: 12/09/2019 12:27   DG Chest Port 1 View  Result Date: 01/03/2020 CLINICAL DATA:  Acute respiratory failure.  Hypoxia. EXAM: PORTABLE CHEST 1 VIEW COMPARISON:  12/14/2019. FINDINGS: Endotracheal tube, NG tube, left IJ line noted in stable position. Cardiomegaly. Interim improvement of bilateral pulmonary infiltrates/edema with residual noted in the right lung base. No prominent pleural effusion. No pneumothorax. Surgical clips both axilla. IMPRESSION: 1.  Lines and tubes in stable position. 2.  Cardiomegaly. 3. Interim improvement of bilateral pulmonary infiltrates/edema with residual noted in the right lung base. Electronically Signed   By: Thomas  Register   On: 01/03/2020 05:48   DG CHEST PORT 1 VIEW  Result Date: 12/29/2019 CLINICAL DATA:  ETT placement EXAM: PORTABLE CHEST 1 VIEW COMPARISON:  12/06/2019 FINDINGS: Endotracheal tube tip is about 3 cm superior to the carina. Left IJ central venous catheter tip over the SVC. No pneumothorax is seen. Esophageal tube tip in the left upper quadrant. Cardiomegaly with bilateral ground-glass opacity and consolidations, slightly improved. Clips within both axilla IMPRESSION: 1. Endotracheal tube tip about 3 cm superior to carina 2. Left IJ central venous catheter tip over the SVC, no pneumothorax. 3. Continued cardiomegaly and central vascular congestion. Fairly extensive bilateral ground-glass opacities/probable edema though with some improvement in aeration compared to earlier radiograph. More confluent consolidation at the right greater than left base could reflect pneumonia. Electronically Signed   By: Kim  Fujinaga M.D.   On: 12/26/2019 19:15   DG Chest Portable 1 View  Result Date: 12/07/2019 CLINICAL DATA:  Intubated, shortness of breath EXAM: PORTABLE CHEST 1 VIEW COMPARISON:   01/02/2020 FINDINGS: Single frontal view of the chest demonstrates endotracheal tube overlying tracheal air column tip at level of thoracic inlet. Enteric catheter passes below diaphragm tip excluded by collimation. Cardiac silhouette is enlarged. There is worsening bilateral airspace disease in a perihilar distribution. The pleural effusion seen on chest CT are less apparent by radiograph. No pneumothorax. No acute bony abnormalities. IMPRESSION: 1. Support devices as above. 2. Findings consistent with progressive congestive heart failure. Electronically Signed   By: Michael  Paganelli M.D.   On: 12/12/2019 17:35   ECHOCARDIOGRAM COMPLETE  Result Date: 12/10/2019    ECHOCARDIOGRAM REPORT   Patient Name:   Izetta D Maguire Date of Exam: 12/07/2019 Medical Rec #:  [id number]       Height:       61.0 in Accession #:    [id number]      Weight:       200.0 lb Date of Birth:  05/09/1951       BSA:          1.889 m Patient Age:    68 years        BP:           106/73 mmHg Patient Gender: F               HR:           98 bpm. Exam Location:  Inpatient Procedure: 2D Echo, Color Doppler, Cardiac Doppler and Intracardiac            Opacification Agent STAT ECHO Indications:    R06.9 DOE  History:        Patient has prior history of Echocardiogram examinations, most                   recent 06/11/2019. CHF, CAD; Risk Factors:Hypertension, Diabetes,                 Dyslipidemia and Sleep Apnea. Breast Cancer.  Sonographer:    Raquel Sarna Senior RDCS Referring Phys: [id number] DAN FLOYD  Sonographer Comments: Technically difficult due to mastectomy and patient sitting upright on bipap. IMPRESSIONS  1. Left ventricular ejection fraction, by estimation, is 25 to 30%. The left ventricle has severely decreased function. The left ventricle demonstrates global hypokinesis. The left ventricular internal cavity size was mildly dilated. Left ventricular diastolic parameters are indeterminate. Elevated left ventricular end-diastolic pressure. There is  severe akinesis of the left ventricular, entire inferior wall, inferoseptal wall and inferolateral wall.  2. Right ventricular systolic function is mildly reduced. The right ventricular size is normal. There is moderately elevated pulmonary artery systolic pressure.  3. The mitral valve is abnormal. Mild to moderate mitral valve regurgitation.  4. The aortic valve is tricuspid. Aortic valve regurgitation is trivial. Mild aortic valve sclerosis is present, with no evidence of aortic valve stenosis.  5. The inferior vena cava is dilated in size with <50% respiratory variability, suggesting right atrial pressure of 15 mmHg. Comparison(s): 06/05/2019: LVEF 45-50%, global hypokinesis. FINDINGS  Left Ventricle: Left ventricular ejection fraction, by estimation, is 25 to 30%. The left ventricle has severely decreased function. The left ventricle demonstrates global hypokinesis. Severe akinesis of the left ventricular, entire inferior wall, inferoseptal wall and inferolateral wall. Definity contrast agent was given IV to delineate the left ventricular endocardial borders. The left ventricular internal cavity size was mildly dilated. There is no left ventricular hypertrophy. Left ventricular  diastolic parameters are indeterminate. Elevated left ventricular end-diastolic pressure. Right Ventricle: The right ventricular size is normal. No increase in right ventricular wall thickness. Right ventricular systolic function is mildly reduced. There is moderately elevated pulmonary artery systolic pressure. The tricuspid regurgitant velocity is 2.80 m/s, and with an assumed right atrial pressure of 15 mmHg, the estimated right ventricular systolic pressure is 98.2 mmHg. Left Atrium: Left atrial size was normal in size. Right Atrium: Right atrial size was normal in size. Pericardium: There is no evidence of pericardial effusion. Mitral Valve: The mitral valve is abnormal. There is mild thickening of the mitral valve leaflet(s). Mild  to moderate mitral valve regurgitation. Tricuspid Valve: The tricuspid valve is grossly normal. Tricuspid valve regurgitation is trivial. Aortic Valve: The aortic valve is tricuspid. Aortic valve regurgitation is trivial. Mild aortic valve sclerosis is present, with no evidence of aortic valve stenosis. Pulmonic Valve: The pulmonic valve was grossly normal. Pulmonic valve regurgitation is trivial. Aorta: The aortic root and ascending aorta are structurally normal, with no evidence of dilitation. Venous: The inferior vena cava is dilated in size with less than 50% respiratory variability, suggesting right atrial pressure of 15 mmHg. IAS/Shunts: No atrial level shunt detected by color flow Doppler.  LEFT VENTRICLE PLAX 2D LVIDd:         5.80 cm  Diastology LVIDs:         5.40 cm  LV e' lateral:   5.22 cm/s LV PW:         0.80 cm  LV E/e' lateral: 18.0 LV IVS:        0.50 cm  LV e' medial:    4.90 cm/s LVOT diam:     2.00 cm  LV E/e' medial:  19.1 LV SV:         35 LV SV Index:   18 LVOT Area:  3.14 cm  RIGHT VENTRICLE RV S prime:     8.16 cm/s TAPSE (M-mode): 1.0 cm LEFT ATRIUM           Index       RIGHT ATRIUM           Index LA diam:      3.30 cm 1.75 cm/m  RA Area:     11.40 cm LA Vol (A4C): 41.3 ml 21.87 ml/m RA Volume:   23.50 ml  12.44 ml/m  AORTIC VALVE LVOT Vmax:   59.90 cm/s LVOT Vmean:  42.500 cm/s LVOT VTI:    0.110 m  AORTA Ao Root diam: 3.00 cm MITRAL VALVE               TRICUSPID VALVE MV Area (PHT): 4.89 cm    TR Peak grad:   31.4 mmHg MV Decel Time: 155 msec    TR Vmax:        280.00 cm/s MV E velocity: 93.80 cm/s MV A velocity: 24.20 cm/s  SHUNTS MV E/A ratio:  3.88        Systemic VTI:  0.11 m                            Systemic Diam: 2.00 cm Kenneth Hilty MD Electronically signed by Kenneth Hilty MD Signature Date/Time: 12/31/2019/2:47:35 PM    Final     Cardiac Studies     Patient Profile     68 y.o. female with severe 3 V cad admitted with repsiratory failure     Assessment &  Plan    1.  CAD:   Cont current care .   She is not a good candidate for repeat cath  Cont current support. Have discussed with family   2.  chf :  Cont supportive care.   3.  HTN:       cont meds   For questions or updates, please contact [facility] Please consult [email or web address] for contact info under        Signed, Philip Nahser, MD  01/03/2020, 10:12 AM    

## 2020-01-03 NOTE — Progress Notes (Signed)
eLink Physician-Brief Progress Note Patient Name: [name] DOB: 29-Mar-1951 MRN: [id number]   Date of Service  01/03/2020  HPI/Events of Note  Patient had 15 beat run of NSVT vs SVT w/ aberrancy. Also has fever.   eICU Interventions  Check lytes and Mg and Calcium level.  Treat fever with tylenol suppository. Already on ABX.  CTM.     Intervention Category Intermediate Interventions: Arrhythmia - evaluation and management  Charlott Rakes 01/03/2020, 11:37 PM

## 2020-01-03 NOTE — Progress Notes (Addendum)
Patient had an 18 beat run of SVT. E-link nurse notified, will continue to monitor.

## 2020-01-03 NOTE — Progress Notes (Signed)
NAME:  [name], MRN:  [id number], DOB:  Dec 12, 1950, LOS: 2 ADMISSION DATE:  12/19/2019, CONSULTATION DATE:  01/03/20 REFERRING MD: Noemi Chapel MD CHIEF COMPLAINT:  Cardiac arrest   Brief History   Patient is a 69 yr old female with PMHx of three vessel CAD, diabetes, HFpEF, HTN, breast cancer s/p radiation, obesity who presented with four days of dyspnea. Patient admitted with cardiogenic shock in setting of worsening CHF requiring intubation with brief PEA arrest with ROSC with brief CPR and 1 dose epinephrine. PCCM to admit  History of present illness   History obtained via chart review and family at bedside as patient is sedated and mechanically ventilated.  Patient is a 68yr old female with PMHx of three vessel CAD, HFpEF (EF 25-30%), HTN, breast cancer s/p radiation, obesity who presented with four day history of shortness of breath. Patient also noted three day history of chest pain unrelieved by nitroglycerin with associated left arm pain in setting of exertion with associated diaphoresis. Pain subsided with rest. Patient also noted to have lower extremity edema, abdominal bloating and decreased urine output despite medication compliance.   Patient initially found to be significantly hypoxic by EMS requiring NRB en route with improvement of O2 sats to low 90s. Patient improved with breathing treatments with EMS. She continued to have difficulty breathing despite receiving magnesium and solumedrol in the ED. Patient started on BiPAP. CXR with possible fluid overload with elevated BNP and troponins. Patient given small dose of lasix and aspirin. CT chest with bilateral infiltrates concerning for possible PNA, however negative for PE. In the ED, patient became gradually more obtunded and unresponsive on BiPAP requiring intubation. During intubation, patient had brief bradycardic PEA arrest lasting <5 minutes with brief CPR and 1 dose epinephrine with ROSC. Patient admitted to [facility].   Past  Medical History   Past Medical History:  Diagnosis Date  . Arthritis    "knees, lower back" (08/18/2017)  . 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 S/p lumpectomy and radiation.  Marland Kitchen CAD (coronary artery disease) 11/07   cath 11/11 :  3V CADsee report   . Cataract    small both eyes  . Chronic lower back pain   . Chronic pain    MR 08/07/10 :  Spondylosis L4-5 with B foraminal narrowing and L4 nerve root enchroachment B.  . Chronic venous insufficiency 2012   Has had full W/U incl ECHO, LFT's, Cr, Umicro, TSH, BNP. Symptomatic treatment.  . Colonic polyp 1995   Colonoscopies 1994, 2000, and 02/02/2011. Last had 9 polyps - Tubular adenoma and tubulovillous was the pathology results without high grade dysplasia  . Diabetes mellitus type II    Insulin dependent. Has worked with [name] R previously.  . Diastolic CHF, chronic ([place])    NL EF by echo 01/2012, Gr I dd  . Essential hypertension    Requires 5 drug therapy and still difficult to control.  Marland Kitchen GERD (gastroesophageal reflux disease)   . Heart murmur   . History of radiation therapy 10/02/18- 11/09/18   Left chest wall SCV, Left chest wall boost, Right chest wall SCV, right chest boost all received 28 fractions for a total dose of 50.04 Gy  . Hyperlipidemia    On daily statin  . Iron deficiency anemia    Ferritin was 12 in 2012. on FeSo4. Has had colon and EGD 02/02/11 that showed mild gastritis and colon polyps.  Marland Kitchen  Obesity    Morbid. HAs worked with [name] T previously.  . OSA (obstructive sleep apnea) 02/16/2007   02/2007 : Moderate AHI 35.6, O2 sat decreased to 65%, CPAP titration to 16 with AHI of 3.6. 02/2014 : Rare respiratory events with sleep disturbance, within normal limits. AHI 0.4 per hour      . OSA on CPAP   . Personal history of chemotherapy   . Personal history of radiation therapy    "to my left breast"  . Refusal of blood transfusions as patient is Jehovah's Witness   .  Seasonal allergies    "grass pollen"   Significant Hospital Events   3/30 - admit to ICU s/p cardiac arrest  Consults:  PCCM Cardiology  Procedures:  ETT 3/30>>  Significant Diagnostic Tests:  3/30 CTA Chest PE >> No PE, extensive bilateral pulmonary infiltrates w/bilateral pleural effusions R>L, tracheomalacia, postoperative seroma  3/30 Echo >> LVEF 25-30%, global hypokinesis, elevated LVEDP, severe akinesis of LV, inferior, inferoseptal and inferolateral wall; moderately elevated pulmonary artery systolic pressure; mild to moderate elevated MV regurgitation  Micro Data:  3/30 SARS COVID-19, Influenza A/B >> negative   Antimicrobials:  Vancomycin x1 3/30 Zosyn x1 3/30  Interim history/subjective:  Norepinephrine effectively weaned, currently 8, propofol down to 15 Off insulin drip I/O- 1.8 L Serum creatinine improving   Objective   Blood pressure (!) 107/43, pulse 91, temperature 98.8 F (37.1 C), resp. rate 20, height 5\' 1"  (1.549 m), weight 94.4 kg, last menstrual period 07/12/1984, SpO2 100 %. CVP:  [7 mmHg-12 mmHg] 9 mmHg  Vent Mode: PRVC FiO2 (%):  [40 %-50 %] 40 % Set Rate:  [20 bmp] 20 bmp Vt Set:  [380 mL] 380 mL PEEP:  [8 cmH20-10 cmH20] 8 cmH20 Plateau Pressure:  [10 [id number] cmH20] 21 cmH20   Intake/Output Summary (Last 24 hours) at 01/03/2020 1006 Last data filed at 01/03/2020 0800 Gross per 24 hour  Intake 728.87 ml  Output 3180 ml  Net -2451.13 ml   Filed Weights   12/12/2019 0750 01/02/20 0518 01/03/20 0356  Weight: 90.7 kg 95.6 kg 94.4 kg    Examination: General: Critically ill woman, sedated and intubated HENT: ET tube in good position, oropharynx otherwise clear, pupils equal Lungs: Improved bilateral breath sounds, no wheezing, no crackles Cardiovascular: Regular, 2/6 systolic murmur Abdomen: Slightly protuberant but nontender, positive bowel sounds Extremities: 1+ pitting edema bilateral lower extremities Neuro: Wakes to voice, opens eyes,  nods to questions and follows commands, weak cough  Chest x-ray 4/1 reviewed by me, shows improving infiltrates, mostly clear on the left with some persistent right sided perihilar opacity  Resolved Hospital Problem list     Assessment & Plan:  Acute hypoxic and hypercarbic respiratory failure requiring mechanical ventilation, due to bilateral infiltrates, presumed cardiogenic pulmonary edema.  Persistent right perihilar infiltrate on chest x-ray 4/1 after diuresis Continue gentle diuretics as she can tolerate, decrease Lasix to 40 mg IV twice daily, follow I/O and renal function Continue PRVC 8 cc/kg, FiO2 and PEEP has been weaned, continue to wean PEEP, currently 10. Follow chest x-ray Will add empiric Unasyn given persistent right sided focal infiltrate, possibility of aspiration when she had brief cardiac arrest.  Cardiogenic shock in setting or worsening HFrEF NSTEMI, Cardiac arrest  Hx three-vessel CAD Patient presented with worsening HFrEF, noted to have severely reduced EF of 25-30% (previously 45-50% in September 2020).  She has had PCI to LCx but unable to be stented, likely culprit vessel (subacute) Continue to  hold heparin given oozing from her gastric tube, may be able to restart aspirin and Brilinta on 4/2 depending on resolution Wean norepinephrine as able Continue diuresis as above No indication for milrinone or dobutamine at this point, appreciate cardiology advice  Acute kidney injury: Patient's baseline sCr 0.9-1.0; Noted to have sCr 1.43 in setting of cardiogenic shock  Diuresis as above, continue to follow BMP, urine output Ensure adequate renal perfusion Renally dose medications  Acute encephalopathy 2/2 respiratory failure and cardiogenic shock  Patient currently sedated with propofol and fentanyl.  Continue to decrease sedating medications   Acute blood loss - oozing CVC site, GT > possible GI bleed Continue to follow serial CBC, stable Consider GI  evaluation depending on persistence, CBC trend  Diabetes mellitus:  Patient's A1c 8.6. She is on metformin at home. Insulin drip converted to sliding-scale insulin  Hx Hypertension: Home antihypertensive meds held in setting of cardiogenic shock  Best practice:  Diet: NPO Pain/Anxiety/Delirium protocol (if indicated): ordered VAP protocol (if indicated): per protocol DVT prophylaxis: heparin subQ + SCDs GI prophylaxis: PPI Glucose control: SSI Mobility: Bed bound Code Status: Full code Family Communication: spoke with the patient's daughters at bedside 4/1.  Disposition: ICU   Labs   CBC: Recent Labs  Lab 12/19/2019 0753 12/11/2019 0818 12/20/2019 1925 01/02/20 0119 01/02/20 0446 01/02/20 1603 01/03/20 0350  WBC 7.7  --  16.4*  --  17.4* 15.0* 14.5*  NEUTROABS 5.5  --   --   --   --   --   --   HGB 11.5*   < > 10.6* 11.2* 10.2* 9.5* 9.4*  HCT 37.1   < > 35.4* 33.0* 32.8* 30.2* 29.8*  MCV 94.9  --  97.8  --  95.9 93.5 93.7  PLT 220  --  257  --  249 199 209   < > = values in this interval not displayed.    Basic Metabolic Panel: Recent Labs  Lab 12/25/2019 0753 12/13/2019 0818 12/21/2019 1124 12/06/2019 1652 12/22/2019 1922 01/02/20 0119 01/02/20 0446 01/03/20 0350  NA 138   < > 136 135  --  138 137 139  K 4.1   < > 4.4 4.9  --  4.0 3.6 4.1  CL 102  --   --   --   --   --  105 103  CO2 23  --   --   --   --   --  18* 24  GLUCOSE 245*  --   --   --   --   --  264* 235*  BUN 29*  --   --   --   --   --  40* 45*  CREATININE 1.43*  --   --   --  1.97*  --  1.83* 1.43*  CALCIUM 9.2  --   --   --   --   --  8.1* 8.5*  MG  --   --   --   --   --   --   --  2.3   < > = values in this interval not displayed.   GFR: Estimated Creatinine Clearance: 39.5 mL/min (A) (by C-G formula based on SCr of 1.43 mg/dL (H)). Recent Labs  Lab 12/16/2019 0753 12/10/2019 1520 12/22/2019 1925 12/05/2019 1928 12/19/2019 2201 01/02/20 0446 01/02/20 1603 01/03/20 0350  WBC   < >  --  16.4*  --   --   17.4* 15.0* 14.5*  LATICACIDVEN  --  3.8*  --  7.9* 5.2*  --   --  1.6   < > = values in this interval not displayed.    Liver Function Tests: Recent Labs  Lab 12/24/2019 0753 01/02/20 0446  AST 43* 212*  ALT 26 166*  ALKPHOS 68 61  BILITOT 0.3 0.2*  PROT 7.7 6.8  ALBUMIN 3.5 3.0*   No results for input(s): LIPASE, AMYLASE in the last 168 hours. No results for input(s): AMMONIA in the last 168 hours.  ABG    Component Value Date/Time   PHART 7.320 (L) 01/02/2020 0119   PCO2ART 34.6 01/02/2020 0119   PO2ART 308.0 (H) 01/02/2020 0119   HCO3 17.9 (L) 01/02/2020 0119   TCO2 19 (L) 01/02/2020 0119   ACIDBASEDEF 8.0 (H) 01/02/2020 0119   O2SAT 100.0 01/02/2020 0119     Coagulation Profile: No results for input(s): INR, PROTIME in the last 168 hours.  Cardiac Enzymes: No results for input(s): CKTOTAL, CKMB, CKMBINDEX, TROPONINI in the last 168 hours.  HbA1C: Hemoglobin A1C  Date/Time Value Ref Range Status  11/29/2019 10:19 AM 8.7 (A) 4.0 - 5.6 % Final  05/31/2019 10:04 AM 8.1 (A) 4.0 - 5.6 % Final   Hgb A1c MFr Bld  Date/Time Value Ref Range Status  12/03/2019 03:20 PM 8.6 (H) 4.8 - 5.6 % Final    Comment:    (NOTE) Pre diabetes:          5.7%-6.4% Diabetes:              >6.4% Glycemic control for   <7.0% adults with diabetes   07/13/2018 01:25 PM 8.9 (H) 4.8 - 5.6 % Final    Comment:    (NOTE) Pre diabetes:          5.7%-6.4% Diabetes:              >6.4% Glycemic control for   <7.0% adults with diabetes     CBG: Recent Labs  Lab 01/02/20 1540 01/02/20 1945 01/02/20 2342 01/03/20 0340 01/03/20 0732  GLUCAP 189* 221* 181* 213* 200*    Critical care time: 32 minutes     Baltazar Apo, MD, PhD 01/03/2020, 10:06 AM  Pulmonary and Critical Care [phone] or if no answer [phone]

## 2020-01-03 DEATH — deceased

## 2020-01-04 ENCOUNTER — Inpatient Hospital Stay (HOSPITAL_COMMUNITY): Payer: Medicare Other

## 2020-01-04 ENCOUNTER — Encounter (HOSPITAL_COMMUNITY): Admission: EM | Disposition: E | Payer: Self-pay | Source: Home / Self Care | Attending: Emergency Medicine

## 2020-01-04 ENCOUNTER — Encounter (HOSPITAL_COMMUNITY): Payer: Self-pay | Admitting: Certified Registered Nurse Anesthetist

## 2020-01-04 DIAGNOSIS — K2961 Other gastritis with bleeding: Secondary | ICD-10-CM

## 2020-01-04 DIAGNOSIS — K922 Gastrointestinal hemorrhage, unspecified: Secondary | ICD-10-CM

## 2020-01-04 HISTORY — PX: ESOPHAGOGASTRODUODENOSCOPY (EGD) WITH PROPOFOL: SHX5813

## 2020-01-04 LAB — GLUCOSE, CAPILLARY
Glucose-Capillary: 175 mg/dL — ABNORMAL HIGH (ref 70–99)
Glucose-Capillary: 195 mg/dL — ABNORMAL HIGH (ref 70–99)
Glucose-Capillary: 235 mg/dL — ABNORMAL HIGH (ref 70–99)
Glucose-Capillary: 238 mg/dL — ABNORMAL HIGH (ref 70–99)
Glucose-Capillary: 239 mg/dL — ABNORMAL HIGH (ref 70–99)
Glucose-Capillary: 244 mg/dL — ABNORMAL HIGH (ref 70–99)

## 2020-01-04 LAB — BASIC METABOLIC PANEL
Anion gap: 11 (ref 5–15)
BUN: 34 mg/dL — ABNORMAL HIGH (ref 8–23)
CO2: 28 mmol/L (ref 22–32)
Calcium: 8.8 mg/dL — ABNORMAL LOW (ref 8.9–10.3)
Chloride: 102 mmol/L (ref 98–111)
Creatinine, Ser: 1.15 mg/dL — ABNORMAL HIGH (ref 0.44–1.00)
GFR calc Af Amer: 57 mL/min — ABNORMAL LOW (ref >60)
GFR calc non Af Amer: 49 mL/min — ABNORMAL LOW (ref >60)
Glucose, Bld: 273 mg/dL — ABNORMAL HIGH (ref 70–99)
Potassium: 3.7 mmol/L (ref 3.5–5.1)
Sodium: 141 mmol/L (ref 135–145)

## 2020-01-04 LAB — BRAIN NATRIURETIC PEPTIDE: B Natriuretic Peptide: 2155.8 pg/mL — ABNORMAL HIGH (ref 0.0–100.0)

## 2020-01-04 LAB — CBC
HCT: 27.8 % — ABNORMAL LOW (ref 36.0–46.0)
HCT: 28.6 % — ABNORMAL LOW (ref 36.0–46.0)
Hemoglobin: 8.6 g/dL — ABNORMAL LOW (ref 12.0–15.0)
Hemoglobin: 8.9 g/dL — ABNORMAL LOW (ref 12.0–15.0)
MCH: 29.2 pg (ref 26.0–34.0)
MCH: 29.3 pg (ref 26.0–34.0)
MCHC: 30.9 g/dL (ref 30.0–36.0)
MCHC: 31.1 g/dL (ref 30.0–36.0)
MCV: 94.2 fL (ref 80.0–100.0)
MCV: 94.1 fL (ref 80.0–100.0)
Platelets: 170 10*3/uL (ref 150–400)
Platelets: 150 10*3/uL (ref 150–400)
RBC: 2.95 MIL/uL — ABNORMAL LOW (ref 3.87–5.11)
RBC: 3.04 MIL/uL — ABNORMAL LOW (ref 3.87–5.11)
RDW: 15.1 % (ref 11.5–15.5)
RDW: 15.1 % (ref 11.5–15.5)
WBC: 8.3 10*3/uL (ref 4.0–10.5)
WBC: 9.5 10*3/uL (ref 4.0–10.5)
nRBC: 1 % — ABNORMAL HIGH (ref 0.0–0.2)
nRBC: 1 % — ABNORMAL HIGH (ref 0.0–0.2)

## 2020-01-04 LAB — MAGNESIUM
Magnesium: 2.1 mg/dL (ref 1.7–2.4)
Magnesium: 2.2 mg/dL (ref 1.7–2.4)

## 2020-01-04 LAB — COMPREHENSIVE METABOLIC PANEL
ALT: 97 U/L — ABNORMAL HIGH (ref 0–44)
AST: 45 U/L — ABNORMAL HIGH (ref 15–41)
Albumin: 2.9 g/dL — ABNORMAL LOW (ref 3.5–5.0)
Alkaline Phosphatase: 58 U/L (ref 38–126)
Anion gap: 10 (ref 5–15)
BUN: 35 mg/dL — ABNORMAL HIGH (ref 8–23)
CO2: 28 mmol/L (ref 22–32)
Calcium: 8.9 mg/dL (ref 8.9–10.3)
Chloride: 103 mmol/L (ref 98–111)
Creatinine, Ser: 1.22 mg/dL — ABNORMAL HIGH (ref 0.44–1.00)
GFR calc Af Amer: 53 mL/min — ABNORMAL LOW (ref >60)
GFR calc non Af Amer: 45 mL/min — ABNORMAL LOW (ref >60)
Glucose, Bld: 238 mg/dL — ABNORMAL HIGH (ref 70–99)
Potassium: 3.7 mmol/L (ref 3.5–5.1)
Sodium: 141 mmol/L (ref 135–145)
Total Bilirubin: 0.3 mg/dL (ref 0.3–1.2)
Total Protein: 6.5 g/dL (ref 6.5–8.1)

## 2020-01-04 LAB — PHOSPHORUS: Phosphorus: 2.9 mg/dL (ref 2.5–4.6)

## 2020-01-04 SURGERY — ESOPHAGOGASTRODUODENOSCOPY (EGD) WITH PROPOFOL
Anesthesia: Monitor Anesthesia Care

## 2020-01-04 MED ORDER — MIDAZOLAM HCL (PF) 5 MG/ML IJ SOLN
INTRAMUSCULAR | Status: AC
  Filled 2020-01-04: qty 2

## 2020-01-04 MED ORDER — FENTANYL CITRATE (PF) 100 MCG/2ML IJ SOLN
INTRAMUSCULAR | Status: AC
  Filled 2020-01-04: qty 2

## 2020-01-04 MED ORDER — MIDAZOLAM HCL 2 MG/2ML IJ SOLN
INTRAMUSCULAR | Status: DC | PRN
  Administered 2020-01-04: 2 mg via INTRAVENOUS

## 2020-01-04 MED ORDER — METOPROLOL TARTRATE 5 MG/5ML IV SOLN
2.5000 mg | Freq: Three times a day (TID) | INTRAVENOUS | Status: DC
  Administered 2020-01-04 – 2020-01-06 (×6): 2.5 mg via INTRAVENOUS
  Filled 2020-01-04 (×6): qty 5

## 2020-01-04 MED ORDER — METOPROLOL TARTRATE 12.5 MG HALF TABLET: 12.5000 mg | ORAL_TABLET | Freq: Two times a day (BID) | ORAL | Status: DC

## 2020-01-04 MED ORDER — PANTOPRAZOLE SODIUM 40 MG IV SOLR: 40.0000 mg | Freq: Two times a day (BID) | INTRAVENOUS | Status: DC

## 2020-01-04 MED ORDER — PANTOPRAZOLE SODIUM 40 MG IV SOLR
40.0000 mg | Freq: Two times a day (BID) | INTRAVENOUS | Status: DC
  Administered 2020-01-04 – 2020-01-08 (×8): 40 mg via INTRAVENOUS
  Filled 2020-01-04 (×8): qty 40

## 2020-01-04 SURGICAL SUPPLY — 15 items

## 2020-01-04 NOTE — Consult Note (Addendum)
Referring Provider:  [facility]         Primary Care Physician:  Bartholomew Crews, MD Primary Gastroenterologist:    Oretha Caprice, MD          We were asked to see this patient for:    GI bleed              ASSESSMENT /  PLAN    69 yo female with pmh significant for DM, HTN, hyperlipidemia, CAD / PCI, morbid obesity, chronic CHF   # Upper GI bleed / acute on chronic anemia --Has mild chronic anemia with baseline hgb ~10-11, now at 8.6.  --Spoke with RN. Averaging ~ 50 ml blood from NGT per shift over last 3 days.    No melena.  --Was on Heparin infusion, discontinued a couple of days ago --Getting IV PPI BID. In setting of active bleeding would normal change to an infusion but has prolonged QT.  --Jehovah's witness. Doesn't take blood products --May need EGD, especially since she will not take blood products. I did discuss procedure with both daughters so they could sign a consent should be decide to proceed. The risks and benefits of EGD were discussed and the daughter agrees to proceed.   # Respiratory failure , on ventilator  # Acute on chronic combined CHF --diuresis in progress  # CAD / NSTEMI / worsening heart failure with cardiogenic shock   --Not good candidate for CABG due to anemia and refusal of blood products.  --Significant disease in circumflex artery on last cath a year or so ago. Got PCI of LAD at that time but stent couldn't be delivered to circumflex --No plans for cath right now / reattempt at PCI of Lcx   # AKI --Overall improved. Cr 1.22 now  #Hx of colon polyps --Due for surveillance colonscopy in June 2021     [facility] Attending   I have taken an interval history, reviewed the chart and examined the patient. I agree with the Advanced Practitioner's note, impression and recommendations.    Persistent low grade GI bleeding in a Jehovah's witness, critically ill after NSTEMI arrest Will do EGD to see if there is anything therapeutic to  offer. The risks and benefits as well as alternatives of endoscopic procedure(s) have been discussed and reviewed. All questions answered. The patient's daughters agrees to proceed.  Gatha Mayer, MD, [facility] 01/22/2020 3:22 PM    HPI:    Chief Complaint: gastrointestinal bleeding  [name] is a 69 y.o. female who presented to ED 3/30 with SOB x 4 days. CXR suggested fluid overload. BNP was high. Troponin was elevated. Chest CT scan showed bilateral infiltrates. During intubation she had a brief PEA arrest responding to CPR and epinephrine. Admitted with NSTEMI, acute respiratory failure requiring intubation and acute on chronic combined CHF   Patient unable to provide history. Daughters in room and tells me that patient complained over abdominal pain for a few days prior to admission. She had an episode of non-bloody emesis on Saturday. Patient takes a baby ASA daily but no other NSAIDs per daughters. No known history of PUD.    Past Medical History:  Diagnosis Date  . Arthritis    "knees, lower back" (08/18/2017)  . 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 S/p lumpectomy and radiation.  Marland Kitchen CAD (coronary artery disease) 11/07   cath 11/11 :  3V CADsee report   .  Cataract    small both eyes  . Chronic lower back pain   . Chronic pain    MR 08/07/10 :  Spondylosis L4-5 with B foraminal narrowing and L4 nerve root enchroachment B.  . Chronic venous insufficiency 2012   Has had full W/U incl ECHO, LFT's, Cr, Umicro, TSH, BNP. Symptomatic treatment.  . Colonic polyp 1995   Colonoscopies 1994, 2000, and 02/02/2011. Last had 9 polyps - Tubular adenoma and tubulovillous was the pathology results without high grade dysplasia  . Diabetes mellitus type II    Insulin dependent. Has worked with [name] R previously.  . Diastolic CHF, chronic ([place])    NL EF by echo 01/2012, Gr I dd  . Essential hypertension     Requires 5 drug therapy and still difficult to control.  Marland Kitchen GERD (gastroesophageal reflux disease)   . Heart murmur   . History of radiation therapy 10/02/18- 11/09/18   Left chest wall SCV, Left chest wall boost, Right chest wall SCV, right chest boost all received 28 fractions for a total dose of 50.04 Gy  . Hyperlipidemia    On daily statin  . Iron deficiency anemia    Ferritin was 12 in 2012. on FeSo4. Has had colon and EGD 02/02/11 that showed mild gastritis and colon polyps.  . Obesity    Morbid. HAs worked with [name] T previously.  . OSA (obstructive sleep apnea) 02/16/2007   02/2007 : Moderate AHI 35.6, O2 sat decreased to 65%, CPAP titration to 16 with AHI of 3.6. 02/2014 : Rare respiratory events with sleep disturbance, within normal limits. AHI 0.4 per hour      . OSA on CPAP   . Personal history of chemotherapy   . Personal history of radiation therapy    "to my left breast"  . Refusal of blood transfusions as patient is Jehovah's Witness   . Seasonal allergies    "grass pollen"    Past Surgical History:  Procedure Laterality Date  . BREAST LUMPECTOMY Left 2006  . BREAST LUMPECTOMY W/ NEEDLE LOCALIZATION  2006   34 Chemo RX  . CARDIAC CATHETERIZATION N/A 08/15/2015   Procedure: Left Heart Cath and Coronary Angiography;  Surgeon: Troy Sine, MD;  Location: [facility];  Service: Cardiovascular;  Laterality: N/A;  . COLONOSCOPY W/ BIOPSIES AND POLYPECTOMY  1994, 2000, 2012, 26/018  . CORONARY STENT INTERVENTION N/A 06/13/2019   Procedure: CORONARY STENT INTERVENTION;  Surgeon: Burnell Blanks, MD;  Location: [facility];  Service: Cardiovascular;  Laterality: N/A;  . IR IMAGING GUIDED PORT INSERTION  04/17/2018  . IR REMOVAL TUN ACCESS W/ PORT W/O FL MOD SED  11/14/2018  . LEFT HEART CATH AND CORONARY ANGIOGRAPHY N/A 08/19/2017   Procedure: LEFT HEART CATH AND CORONARY ANGIOGRAPHY;  Surgeon: Belva Crome, MD;  Location: [facility];  Service:  Cardiovascular;  Laterality: N/A;  . LEFT HEART CATH AND CORONARY ANGIOGRAPHY N/A 06/13/2019   Procedure: LEFT HEART CATH AND CORONARY ANGIOGRAPHY;  Surgeon: Burnell Blanks, MD;  Location: [facility];  Service: Cardiovascular;  Laterality: N/A;  . MASTECTOMY MODIFIED RADICAL Bilateral 07/20/2018  . MASTECTOMY MODIFIED RADICAL Bilateral 07/20/2018   Procedure: BILATERAL MODIFIED RADICAL MASTECTOMIES;  Surgeon: Jovita Kussmaul, MD;  Location: [facility];  Service: General;  Laterality: Bilateral;  . PORTACATH PLACEMENT N/A 04/14/2018   Procedure: ATTEMPED INSERTION PORT-A-CATH;  Surgeon: Jovita Kussmaul, MD;  Location: [facility];  Service: General;  Laterality: N/A;  .  TONSILLECTOMY AND ADENOIDECTOMY  1964  . TOTAL ABDOMINAL HYSTERECTOMY  1985   "I had endometrosis"  . ULTRASOUND GUIDANCE FOR VASCULAR ACCESS  08/19/2017   Procedure: Ultrasound Guidance For Vascular Access;  Surgeon: Belva Crome, MD;  Location: [facility];  Service: Cardiovascular;;    Prior to Admission medications   Medication Sig Start Date End Date Taking? Authorizing Provider  amLODipine (NORVASC) 10 MG tablet TAKE ONE (1) TABLET BY MOUTH EVERY DAY Patient taking differently: Take 10 mg by mouth daily.  12/13/18  Yes Bartholomew Crews, MD  aspirin EC 81 MG tablet Take 81 mg by mouth daily.   Yes [provider]  BRILINTA 90 MG TABS tablet Take 1 tablet by mouth 2 (two) times daily. 09/26/19  Yes [provider]  Coenzyme Q10 (CO Q 10) 100 MG CAPS Take 100 mg by mouth daily.    Yes [provider]  furosemide (LASIX) 20 MG tablet TAKE 1 TABLET (20 MG TOTAL) BY MOUTH AS NEEDED FOR EDEMA. Patient taking differently: Take 20 mg by mouth daily as needed for edema.  01/17/19 01/17/20 Yes Bartholomew Crews, MD  gabapentin (NEURONTIN) 300 MG capsule Take 1 capsule (300 mg total) by mouth 3 (three) times daily. Patient taking differently: Take 300 mg by mouth 3 (three) times daily as  needed.  11/27/19  Yes Nicholas Lose, MD  guaiFENesin (ROBITUSSIN) 100 MG/5ML SOLN Take 10 mLs by mouth every 4 (four) hours as needed for cough or to loosen phlegm. Uses sugar free   Yes [provider]  insulin aspart (NOVOLOG FLEXPEN) 100 UNIT/ML FlexPen Please take 8U in the morning before breakfast and take 10U at lunch and with dinner. 12/07/19  Yes Kathi Ludwig, MD  isosorbide mononitrate (IMDUR) 120 MG 24 hr tablet Take 1 tablet (120 mg total) by mouth 2 (two) times daily. 07/04/19  Yes Lendon Colonel, NP  letrozole [facility]) 2.5 MG tablet Take 0.5 tablets (1.25 mg total) by mouth daily. 10/19/19  Yes Nicholas Lose, MD  losartan (COZAAR) 50 MG tablet Take 1 tablet (50 mg total) by mouth daily. 07/04/19  Yes Lendon Colonel, NP  metFORMIN (GLUCOPHAGE) 1000 MG tablet TAKE 1 TABLET (1,000 MG TOTAL) BY MOUTH 2 (TWO) TIMES DAILY WITH A MEAL. 12/03/19  Yes Bartholomew Crews, MD  metoprolol succinate (TOPROL-XL) 100 MG 24 hr tablet TAKE ONE (1) TABLET BY MOUTH EVERY DAY Patient taking differently: Take 100 mg by mouth daily.  01/17/19  Yes Bartholomew Crews, MD  Multiple Vitamins-Minerals (CENTRUM SILVER PO) Take 1 tablet by mouth daily.   Yes [provider]  nitroGLYCERIN (NITROSTAT) 0.4 MG SL tablet PLACE ONE TABLET UNDER THE TONGUE EVERY 5 MINUTES AS NEEDED FOR CHEST PAIN Patient taking differently: Place 0.4 mg under the tongue every 5 (five) minutes as needed for chest pain.  01/17/19  Yes Minus Breeding, MD  Olopatadine HCl (PATADAY) 0.2 % SOLN Place 1 drop into both eyes daily as needed (for allergies).    Yes [provider]  omeprazole (PRILOSEC) 40 MG capsule Take 1 capsule (40 mg total) by mouth daily. Patient taking differently: Take 40 mg by mouth daily as needed (for acid reflux).  05/17/18  Yes Tanner, Lyndon Code., PA-C  pyridoxine (B-6) 100 MG tablet Take 100 mg by mouth daily.   Yes [provider]  RESTASIS 0.05 % ophthalmic emulsion Place  1 drop into both eyes 2 (two) times daily as needed (dry eyes).  08/13/19  Yes [provider]  rosuvastatin (CRESTOR) 10 MG tablet Take 1 tablet (10 mg total) by mouth daily. 12/25/19 03/24/20 Yes Minus Breeding, MD  spironolactone (ALDACTONE) 25 MG tablet Take 2 tablets (50 mg total) by mouth daily. Patient taking differently: Take 25 mg by mouth daily.  07/04/19  Yes Lendon Colonel, NP  TOUJEO SOLOSTAR 300 UNIT/ML SOPN INJECT 35 UNITS INTO THE SKIN DAILY Patient taking differently: Inject 35 Units into the skin daily.  04/09/19  Yes Bartholomew Crews, MD  vitamin B-12 (CYANOCOBALAMIN) 1000 MCG tablet Take 1,000 mcg by mouth daily.   Yes [provider]  ACCU-CHEK FASTCLIX LANCETS MISC Check blood sugar 3 times a day Patient taking differently: 1 each by Other route 3 (three) times daily.  10/11/17   Bartholomew Crews, MD  Continuous Blood Gluc Receiver (FREESTYLE LIBRE 2 READER) DEVI 1 each by Does not apply route 6 (six) times daily. 11/29/19   Bartholomew Crews, MD  Continuous Blood Gluc Sensor (FREESTYLE LIBRE 2 SENSOR) MISC 1 each by Does not apply route 6 (six) times daily. 11/29/19   Bartholomew Crews, MD  glucose blood (ACCU-CHEK GUIDE) test strip USE TO CHECK BLOOD SUGARS THREE TIMES DAILY 12/10/19   Bartholomew Crews, MD  Insulin Pen Needle 32G X 4 MM MISC Use to inject insulin 4 times daily under the skin. DIAG CODE E11.319. INSULIN DEPENDENT 12/04/19   Bartholomew Crews, MD    Current Facility-Administered Medications  Medication Dose Route Frequency Provider Last Rate Last Admin  . 0.9 %  sodium chloride infusion   Intra-arterial PRN Kipp Brood, MD   Stopped at 01/17/2020 1159  . acetaminophen (TYLENOL) suppository 650 mg  650 mg Rectal Q4H PRN Stretch, Marily Lente, MD   650 mg at 01/25/2020 0056  . Ampicillin-Sulbactam (UNASYN) 3 g in sodium chloride 0.9 % 100 mL IVPB  3 g Intravenous Q6H Collene Gobble, MD 200 mL/hr at 01/17/2020 1200 Rate Verify at  01/23/2020 1200  . chlorhexidine gluconate (MEDLINE KIT) (PERIDEX) 0.12 % solution 15 mL  15 mL Mouth Rinse BID Agarwala, Ravi, MD   15 mL at 01/27/2020 0800  . Chlorhexidine Gluconate Cloth 2 % PADS 6 each  6 each Topical Q0600 Kipp Brood, MD   6 each at 01/24/2020 [phone]  . dexmedetomidine (PRECEDEX) 400 MCG/100ML (4 mcg/mL) infusion  0-2 mcg/kg/hr Intravenous Continuous Collene Gobble, MD 33 mL/hr at 01/21/2020 1200 1.4 mcg/kg/hr at 01/03/2020 1200  . fentaNYL (SUBLIMAZE) bolus via infusion 25 mcg  25 mcg Intravenous Q15 min PRN Aslam, Sadia, MD      . fentaNYL (SUBLIMAZE) injection 25 mcg  25 mcg Intravenous Once Aslam, Sadia, MD      . fentaNYL (SUBLIMAZE) injection 25-100 mcg  25-100 mcg Intravenous Q30 min PRN Harvie Heck, MD   100 mcg at 01/03/20 0500  . fentaNYL 2569mg in NS 2534m(1043mml) infusion-PREMIX  25-200 mcg/hr Intravenous Continuous AslHarvie HeckD 10 mL/hr at 01/19/2020 0949 100 mcg/hr at 01/20/2020 0949  . furosemide (LASIX) injection 40 mg  40 mg Intravenous BID ByrCollene GobbleD   40 mg at 02/01/2020 0836  . insulin aspart (novoLOG) injection 2-6 Units  2-6 Units Subcutaneous Q4H ByrCollene GobbleD   4 Units at 01/26/2020 1219  . insulin detemir (LEVEMIR) injection 12 Units  12 Units Subcutaneous Q12H ByrCollene GobbleD   12 Units at 01/16/2020 0950  . MEDLINE mouth rinse  15 mL Mouth Rinse 10 times  per day Kipp Brood, MD   15 mL at 01/21/2020 01-02-18  . metoprolol tartrate (LOPRESSOR) injection 2.5 mg  2.5 mg Intravenous Q8H Jose Persia, MD   2.5 mg at 01/05/2020 1200  . norepinephrine (LEVOPHED) 16 mg in 232m premix infusion  0-40 mcg/min Intravenous Continuous AKipp Brood MD   Stopped at 01/07/2020 0149  . pantoprazole (PROTONIX) injection 40 mg  40 mg Intravenous Q12H BCollene Gobble MD      . rosuvastatin (CRESTOR) tablet 10 mg  10 mg Oral Daily AJean Rosenthal MD   10 mg at 01/02/20 0908  . sennosides (SENOKOT) 8.8 MG/5ML syrup 5 mL  5 mL Per Tube BID PRN Aslam, Sadia, MD       . sodium chloride flush (NS) 0.9 % injection 10-40 mL  10-40 mL Intracatheter Q12H BCollene Gobble MD   10 mL at 01/14/2020 0837  . sodium chloride flush (NS) 0.9 % injection 10-40 mL  10-40 mL Intracatheter PRN BCollene Gobble MD        Allergies as of 12/20/2019 - Review Complete 12/06/2019  Allergen Reaction Noted  . Blood-group specific substance Other (See Comments) 11/14/2018  . Lisinopril Other (See Comments) 11/11/2010  . Oxycodone-acetaminophen Hives, Itching, and Other (See Comments) 11/11/2010  . Percocet [oxycodone-acetaminophen] Hives 06/23/2018  . Victoza [liraglutide] Other (See Comments) 03/24/2017  . Atorvastatin Other (See Comments) 07/10/2015    Family History  Problem Relation Age of Onset  . Lung cancer Mother   . Diabetes Father   . Breast cancer Sister   . Pulmonary embolism Brother   . Depression Daughter 142 . Osteoarthritis Maternal Grandmother   . Heart attack Brother 559 . Kidney failure Brother        died 2Apr 01, 20172/2 kidney and heart failure  . Colon cancer Neg Hx   . Colon polyps Neg Hx   . Esophageal cancer Neg Hx   . Rectal cancer Neg Hx   . Stomach cancer Neg Hx     Social History   Socioeconomic History  . Marital status: Legally Separated    Spouse name: Not on file  . Number of children: Not on file  . Years of education: 136 . Highest education level: Associate degree: academic program  Occupational History  . Not on file  Tobacco Use  . Smoking status: Never Smoker  . Smokeless tobacco: Never Used  Substance and Sexual Activity  . Alcohol use: No  . Drug use: No  . Sexual activity: Not Currently  Other Topics Concern  . Not on file  Social History Narrative   Worked at [employer]time from [phone] 2009-2011. No longer working 204/01/2015Associates degree in early childhood education. Single, lives by self.   Social Determinants of Health   Financial Resource Strain: Low Risk   . Difficulty of Paying Living  Expenses: Not hard at all  Food Insecurity: No Food Insecurity  . Worried About RCharity fundraiserin the Last Year: Never true  . Ran Out of Food in the Last Year: Never true  Transportation Needs:   . Lack of Transportation (Medical):   .Marland KitchenLack of Transportation (Non-Medical):   Physical Activity: Unknown  . Days of Exercise per Week: 2 days  . Minutes of Exercise per Session: Not asked  Stress:   . Feeling of Stress :   Social Connections:   . Frequency of Communication with Friends and Family:   . Frequency of Social Gatherings with Friends  and Family:   . Attends Religious Services:   . Active Member of Clubs or Organizations:   . Attends Archivist Meetings:   Marland Kitchen Marital Status:   Intimate Partner Violence:   . Fear of Current or Ex-Partner:   . Emotionally Abused:   Marland Kitchen Physically Abused:   . Sexually Abused:     Review of Systems: Unobtainable . Patient drowsy, on ventilator  Physical Exam: Vital signs in last 24 hours: Temp:  [98.6 F (37 C)-101.3 F (38.5 C)] 100.2 F (37.9 C) (04/02 1200) Pulse Rate:  [70-91] 70 (04/02 1104) Resp:  [20-22] 20 (04/02 1104) BP: (89-139)/(59-83) 114/65 (04/02 1200) SpO2:  [100 %] 100 % (04/02 1200) FiO2 (%):  [40 %] 40 % (04/02 1200) Weight:  [92 kg] 92 kg (04/02 0433) Last BM Date: (pta) General:   Partially alert female in NAD. Tries to open eyes when spoken to. NGT >> fresh blood in tube and small amount in cannister  Psych:  Cannot assess Eyes:  Pupils equal, sclera clear, no icterus.   Conjunctiva pink. Ears:  Normal auditory acuity. Nose:  No deformity, discharge,  or lesions. Neck:  Supple; no masses Lungs:  On Ventilator. Chest clear to auscultation.   Heart:  Regular rate and rhythm; no murmurs, 1+ bilateral lower extremity edema Abdomen:  Soft, non-distended, doesn't appear tender. A few BS  Rectal:  Deferred  Msk:  Symmetrical without gross deformities. . Neurologic:  Alert and  oriented x4;  grossly  normal neurologically. Skin:  Intact without significant lesions or rashes.   Intake/Output from previous day: 04/01 0701 - 04/02 0700 In: 1134.8 [I.V.:734.9; IV Piggyback:400] Out: 2010 [Urine:3090; Emesis/NG output:100] Intake/Output this shift: Total I/O In: 195.8 [I.V.:194; IV Piggyback:1.8] Out: -   Lab Results: Recent Labs    01/02/20 1603 01/03/20 0350 01/25/2020 0445  WBC 15.0* 14.5* 8.3  HGB 9.5* 9.4* 8.6*  HCT 30.2* 29.8* 27.8*  PLT 199 209 170   BMET Recent Labs    01/03/20 0350 01/21/2020 0017 01/07/2020 0445  NA 139 141 141  K 4.1 3.7 3.7  CL 103 102 103  CO2 '24 28 28  '$ GLUCOSE 235* 273* 238*  BUN 45* 34* 35*  CREATININE 1.43* 1.15* 1.22*  CALCIUM 8.5* 8.8* 8.9   LFT Recent Labs    01/05/2020 0445  PROT 6.5  ALBUMIN 2.9*  AST 45*  ALT 97*  ALKPHOS 58  BILITOT 0.3   PT/INR No results for input(s): LABPROT, INR in the last 72 hours. Hepatitis Panel No results for input(s): HEPBSAG, HCVAB, HEPAIGM, HEPBIGM in the last 72 hours.   . CBC Latest Ref Rng & Units 01/09/2020 01/03/2020 01/02/2020  WBC 4.0 - 10.5 K/uL 8.3 14.5(H) 15.0(H)  Hemoglobin 12.0 - 15.0 g/dL 8.6(L) 9.4(L) 9.5(L)  Hematocrit 36.0 - 46.0 % 27.8(L) 29.8(L) 30.2(L)  Platelets 150 - 400 K/uL 170 209 199    . CMP Latest Ref Rng & Units 01/25/2020 01/20/2020 01/03/2020  Glucose 70 - 99 mg/dL 238(H) 273(H) 235(H)  BUN 8 - 23 mg/dL 35(H) 34(H) 45(H)  Creatinine 0.44 - 1.00 mg/dL 1.22(H) 1.15(H) 1.43(H)  Sodium 135 - 145 mmol/L 141 141 139  Potassium 3.5 - 5.1 mmol/L 3.7 3.7 4.1  Chloride 98 - 111 mmol/L 103 102 103  CO2 22 - 32 mmol/L '28 28 24  '$ Calcium 8.9 - 10.3 mg/dL 8.9 8.8(L) 8.5(L)  Total Protein 6.5 - 8.1 g/dL 6.5 - -  Total Bilirubin 0.3 - 1.2 mg/dL 0.3 - -  Alkaline Phos 38 - 126 U/L 58 - -  AST 15 - 41 U/L 45(H) - -  ALT 0 - 44 U/L 97(H) - -   Studies/Results: DG Chest Port 1 View  Result Date: 01/19/2020 CLINICAL DATA:  69 year old female with history of acute respiratory  failure with hypoxia. EXAM: PORTABLE CHEST 1 VIEW COMPARISON:  Chest x-ray 01/03/2020. FINDINGS: An endotracheal tube is in place with tip 3.6 cm above the carina. There is a left-sided internal jugular central venous catheter with tip terminating in the mid superior vena cava. A nasogastric tube is seen extending into the stomach, however, the tip of the nasogastric tube extends below the lower margin of the image. Lung volumes are slightly low. Bibasilar opacities which may reflect areas of atelectasis and/or consolidation. Small bilateral pleural effusions. No pneumothorax. No evidence of pulmonary edema. Heart size is mildly enlarged. Upper mediastinal contours are within normal limits. Aortic atherosclerosis. Surgical clips project over the chest wall and axillary regions bilaterally, likely from prior axillary nodal dissection. IMPRESSION: 1. Support apparatus, as above. 2. Worsening aeration in the lung bases with increasing regions of atelectasis and/or consolidation and small bilateral pleural effusions. 3. Mild cardiomegaly. 4. Aortic atherosclerosis. Electronically Signed   By: Vinnie Langton M.D.   On: 01/07/2020 05:26   Portable Chest x-ray  Result Date: 01/03/2020 CLINICAL DATA:  Intubation. EXAM: PORTABLE CHEST 1 VIEW COMPARISON:  12/10/2019.  CT 12/05/2019. FINDINGS: Endotracheal tube tip noted 4 cm above the carina. NG tube, left IJ line stable position. Heart size normal. Bibasilar infiltrates/edema and small bilateral pleural effusions again noted. No interim change. Surgical clips both axilla. IMPRESSION: 1. Endotracheal tube tip noted 4 cm above the carina. Lines and tubes in stable position. 2. Cardiomegaly with bibasilar pulmonary infiltrates/edema and small bilateral pleural effusions. No interim change from prior exam. Electronically Signed   By: Marcello Moores  Register   On: 01/03/2020 13:42   DG Chest Port 1 View  Result Date: 01/03/2020 CLINICAL DATA:  Acute respiratory failure.   Hypoxia. EXAM: PORTABLE CHEST 1 VIEW COMPARISON:  12/28/2019. FINDINGS: Endotracheal tube, NG tube, left IJ line noted in stable position. Cardiomegaly. Interim improvement of bilateral pulmonary infiltrates/edema with residual noted in the right lung base. No prominent pleural effusion. No pneumothorax. Surgical clips both axilla. IMPRESSION: 1.  Lines and tubes in stable position. 2.  Cardiomegaly. 3. Interim improvement of bilateral pulmonary infiltrates/edema with residual noted in the right lung base. Electronically Signed   By: Marcello Moores  Register   On: 01/03/2020 05:48    Principal Problem:   Acute hypoxemic respiratory failure ([place]) Active Problems:   CAD (coronary artery disease)   Morbid obesity ([place])   DM type 2, uncontrolled, with retinopathy ([place])   Decompensated heart failure ([place])   NSTEMI (non-ST elevated myocardial infarction) ([place])   Cardiac arrest ([place])   Acute on chronic systolic congestive heart failure ([place])    Tye Savoy, NP-C @  01/07/2020, 1:25 PM

## 2020-01-04 NOTE — Progress Notes (Signed)
    No new cardiology recs Cont medical therapy for CAD and ischemic cardiomyopathy. Still on the vent , criticall ill Plans per pccm team     Mertie Moores, MD  01/19/2020 1:03 PM    [address],  [address] [place], [place]  [address] Phone: [phone]; Fax: [phone]

## 2020-01-04 NOTE — Progress Notes (Signed)
NAME:  [name], MRN:  [id number], DOB:  [birth year], LOS: 3 ADMISSION DATE:  12/04/2019, CONSULTATION DATE:  01/05/2020 REFERRING MD: Noemi Chapel MD CHIEF COMPLAINT:  Cardiac arrest   Brief History   Patient is a 69 yr old female with PMHx of three vessel CAD, diabetes, HFpEF, HTN, breast cancer s/p radiation, obesity who presented with four days of dyspnea. Patient admitted with cardiogenic shock in setting of worsening CHF requiring intubation with brief PEA arrest with ROSC with brief CPR and 1 dose epinephrine. PCCM to admit  History of present illness   History obtained via chart review and family at bedside as patient is sedated and mechanically ventilated.  Patient is a 69yr old female with PMHx of three vessel CAD, HFpEF (EF 25-30%), HTN, breast cancer s/p radiation, obesity who presented with four day history of shortness of breath. Patient also noted three day history of chest pain unrelieved by nitroglycerin with associated left arm pain in setting of exertion with associated diaphoresis. Pain subsided with rest. Patient also noted to have lower extremity edema, abdominal bloating and decreased urine output despite medication compliance.   Patient initially found to be significantly hypoxic by EMS requiring NRB en route with improvement of O2 sats to low 90s. Patient improved with breathing treatments with EMS. She continued to have difficulty breathing despite receiving magnesium and solumedrol in the ED. Patient started on BiPAP. CXR with possible fluid overload with elevated BNP and troponins. Patient given small dose of lasix and aspirin. CT chest with bilateral infiltrates concerning for possible PNA, however negative for PE. In the ED, patient became gradually more obtunded and unresponsive on BiPAP requiring intubation. During intubation, patient had brief bradycardic PEA arrest lasting <5 minutes with brief CPR and 1 dose epinephrine with ROSC. Patient admitted to [facility].   Past  Medical History   Past Medical History:  Diagnosis Date  . Arthritis    "knees, lower back" (08/18/2017)  . 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 S/p lumpectomy and radiation.  Marland Kitchen CAD (coronary artery disease) 11/07   cath 11/11 :  3V CADsee report   . Cataract    small both eyes  . Chronic lower back pain   . Chronic pain    MR 08/07/10 :  Spondylosis L4-5 with B foraminal narrowing and L4 nerve root enchroachment B.  . Chronic venous insufficiency 2012   Has had full W/U incl ECHO, LFT's, Cr, Umicro, TSH, BNP. Symptomatic treatment.  . Colonic polyp 1995   Colonoscopies 1994, 2000, and 02/02/2011. Last had 9 polyps - Tubular adenoma and tubulovillous was the pathology results without high grade dysplasia  . Diabetes mellitus type II    Insulin dependent. Has worked with [name] R previously.  . Diastolic CHF, chronic ([place])    NL EF by echo 01/2012, Gr I dd  . Essential hypertension    Requires 5 drug therapy and still difficult to control.  Marland Kitchen GERD (gastroesophageal reflux disease)   . Heart murmur   . History of radiation therapy 10/02/18- 11/09/18   Left chest wall SCV, Left chest wall boost, Right chest wall SCV, right chest boost all received 28 fractions for a total dose of 50.04 Gy  . Hyperlipidemia    On daily statin  . Iron deficiency anemia    Ferritin was 12 in 2012. on FeSo4. Has had colon and EGD 02/02/11 that showed mild gastritis and colon polyps.  Marland Kitchen  Obesity    Morbid. HAs worked with [name] T previously.  . OSA (obstructive sleep apnea) 02/16/2007   02/2007 : Moderate AHI 35.6, O2 sat decreased to 65%, CPAP titration to 16 with AHI of 3.6. 02/2014 : Rare respiratory events with sleep disturbance, within normal limits. AHI 0.4 per hour      . OSA on CPAP   . Personal history of chemotherapy   . Personal history of radiation therapy    "to my left breast"  . Refusal of blood transfusions as patient is Jehovah's Witness   .  Seasonal allergies    "grass pollen"   Significant Hospital Events   3/30 - admit to ICU s/p cardiac arrest  Consults:  PCCM Cardiology  Procedures:  ETT 3/30>>  Significant Diagnostic Tests:  3/30 CTA Chest PE >> No PE, extensive bilateral pulmonary infiltrates w/bilateral pleural effusions R>L, tracheomalacia, postoperative seroma  3/30 Echo >> LVEF 25-30%, global hypokinesis, elevated LVEDP, severe akinesis of LV, inferior, inferoseptal and inferolateral wall; moderately elevated pulmonary artery systolic pressure; mild to moderate elevated MV regurgitation  Micro Data:  3/30 SARS COVID-19, Influenza A/B >> negative  Blood cultures (3/31): NGTD  Antimicrobials:  Vancomycin x1 3/30 Zosyn x1 3/30 Unasyn 4/1  Interim history/subjective:  Febrile episode overnight to a  Maximum of 100.8.  15 beat run of NSVT vs SVT.   Levophed weaned off without difficulty. Current drips include Precedex and Fentanyl this AM.   Objective   Blood pressure 107/67, pulse 73, temperature (!) 100.8 F (38.2 C), resp. rate 20, height 5\' 1"  (1.549 m), weight 92 kg, last menstrual period 07/12/1984, SpO2 100 %. CVP:  [5 mmHg-11 mmHg] 5 mmHg  Vent Mode: PRVC FiO2 (%):  [40 %] 40 % Set Rate:  [20 bmp] 20 bmp Vt Set:  [380 mL] 380 mL PEEP:  [5 cmH20-8 cmH20] 5 cmH20 Plateau Pressure:  [14 cmH20-22 cmH20] 14 cmH20   Intake/Output Summary (Last 24 hours) at 01/22/2020 0819 Last data filed at 01/16/2020 0600 Gross per 24 hour  Intake 1071.76 ml  Output 2990 ml  Net -1918.24 ml   Filed Weights   01/02/20 0518 01/03/20 0356 01/31/2020 0433  Weight: 95.6 kg 94.4 kg 92 kg    Examination: General: Critically ill woman, sedated and intubated HENT: ET tube in place.  Lungs:Clear to ascultation bilaterally. No wheezing or rales.  Cardiovascular: Regular rate and rhythm Abdomen: Slightly distended but nontender, positive bowel sounds Extremities: Trace pitting edema bilateral lower extremities Neuro:  Sedated; not waking to voice or following commands  Resolved Hospital Problem list     Assessment & Plan:  Acute hypoxic and hypercarbic respiratory failure requiring mechanical ventilation, due to bilateral infiltrates, presumed cardiogenic pulmonary edema.   Persistent right perihilar infiltrate on chest x-ray 4/1 after diuresis Continue gentle diuretics as she can tolerate. Currently on Lasix to 40 mg IV twice daily. Plan to continue current dosing.  Follow I/O and renal function (net negative 2 L in the past 24 h, creatinine slightly increased but overall stable) FiO2 of 40, PEEP of 5. Will continue working towards weaning today.  CXR today shows increasing regions of atelectasis versus consolidation. Bilateral pleural effusions present, however only mildly worsened from yesterday.  Unasyn added on 4/1 for aspiration pneumonia. Day 2/5 day course.   Cardiogenic shock in setting or worsening HFrEF NSTEMI, Cardiac arrest  Hx three-vessel CAD Patient presented with worsening HFrEF, noted to have severely reduced EF of 25-30% (previously 45-50% in September 2020).  She has had PCI to LCx but unable to be stented, likely culprit vessel (subacute) Continue to hold heparin secondary to GI bleed.  Levophed was able to be weaned.  Continue diuresis as above Will begin to optimize maintenance medication for CAD and HFrEF --> start low dose Metoprolol  Acute kidney injury: Patient's baseline sCr 0.9-1.0 Continues to overall trend down since admission. Today sCr of 1.22.  Diuresis as above, continue to follow BMP, urine output Ensure adequate renal perfusion Renally dose medications  Acute encephalopathy 2/2 respiratory failure and cardiogenic shock  Patient currently sedated on Precedex and Fentanyl. Propofol was discontinued on 4/1 Continue to wean sedating medications today   Acute blood loss - oozing CVC site, GT > possible GI bleed Hemoglobin slowly trending downwards. Today @ 8.6.  Patient is a Jehovah's witness; no blood products.  Approximately 100 cc of blood loss per day via NG tube in the past 2 hours.  Increased Protonix today to BID dosing.   Continue to follow serial CBC.  Due to continued bloody output from NGT, Gastroenterology was consulted today.   Diabetes mellitus:  A1c 8.6%. Home medications include Metformin. Insulin drip initially. Converted to sliding-scale insulin. Tolerating well.  Hx Hypertension: Home antihypertensive meds held in setting of cardiogenic shock  Best practice:  Diet: NPO Pain/Anxiety/Delirium protocol (if indicated): ordered VAP protocol (if indicated): per protocol DVT prophylaxis: SCDs GI prophylaxis: PPI Glucose control: SSI Mobility: Bed bound Code Status: Full code Family Communication: Family updated at bedside on 4/2.  Disposition: ICU   Labs   CBC: Recent Labs  Lab 12/16/2019 0753 12/16/2019 0818 12/28/2019 1925 12/06/2019 1925 01/02/20 0119 01/02/20 0446 01/02/20 1603 01/03/20 0350 01/10/2020 0445  WBC 7.7   < > 16.4*  --   --  17.4* 15.0* 14.5* 8.3  NEUTROABS 5.5  --   --   --   --   --   --   --   --   HGB 11.5*   < > 10.6*   < > 11.2* 10.2* 9.5* 9.4* 8.6*  HCT 37.1   < > 35.4*   < > 33.0* 32.8* 30.2* 29.8* 27.8*  MCV 94.9   < > 97.8  --   --  95.9 93.5 93.7 94.2  PLT 220   < > 257  --   --  249 199 209 170   < > = values in this interval not displayed.    Basic Metabolic Panel: Recent Labs  Lab 12/25/2019 0753 12/07/2019 0818 12/07/2019 1652 12/24/2019 1922 01/02/20 0119 01/02/20 0446 01/03/20 0350 01/31/2020 0017 01/30/2020 0445  NA 138   < >   < >  --  138 137 139 141 141  K 4.1   < >   < >  --  4.0 3.6 4.1 3.7 3.7  CL 102  --   --   --   --  105 103 102 103  CO2 23  --   --   --   --  18* 24 28 28   GLUCOSE 245*  --   --   --   --  264* 235* 273* 238*  BUN 29*  --   --   --   --  40* 45* 34* 35*  CREATININE 1.43*   < >  --  1.97*  --  1.83* 1.43* 1.15* 1.22*  CALCIUM 9.2  --   --   --   --  8.1*  8.5* 8.8* 8.9  MG  --   --   --   --   --   --  2.3 2.1 2.2  PHOS  --   --   --   --   --   --   --   --  2.9   < > = values in this interval not displayed.   GFR: Estimated Creatinine Clearance: 45.6 mL/min (A) (by C-G formula based on SCr of 1.22 mg/dL (H)). Recent Labs  Lab 12/05/2019 1520 12/19/2019 1925 12/16/2019 1928 12/04/2019 2201 01/02/20 0446 01/02/20 1603 01/03/20 0350 01/30/2020 0445  WBC  --    < >  --   --  17.4* 15.0* 14.5* 8.3  LATICACIDVEN 3.8*  --  7.9* 5.2*  --   --  1.6  --    < > = values in this interval not displayed.    Liver Function Tests: Recent Labs  Lab 12/18/2019 0753 01/02/20 0446 01/18/2020 0445  AST 43* 212* 45*  ALT 26 166* 97*  ALKPHOS 68 61 58  BILITOT 0.3 0.2* 0.3  PROT 7.7 6.8 6.5  ALBUMIN 3.5 3.0* 2.9*   No results for input(s): LIPASE, AMYLASE in the last 168 hours. No results for input(s): AMMONIA in the last 168 hours.  ABG    Component Value Date/Time   PHART 7.320 (L) 01/02/2020 0119   PCO2ART 34.6 01/02/2020 0119   PO2ART 308.0 (H) 01/02/2020 0119   HCO3 17.9 (L) 01/02/2020 0119   TCO2 19 (L) 01/02/2020 0119   ACIDBASEDEF 8.0 (H) 01/02/2020 0119   O2SAT 100.0 01/02/2020 0119     Coagulation Profile: No results for input(s): INR, PROTIME in the last 168 hours.  Cardiac Enzymes: No results for input(s): CKTOTAL, CKMB, CKMBINDEX, TROPONINI in the last 168 hours.  HbA1C: Hemoglobin A1C  Date/Time Value Ref Range Status  11/29/2019 10:19 AM 8.7 (A) 4.0 - 5.6 % Final  05/31/2019 10:04 AM 8.1 (A) 4.0 - 5.6 % Final   Hgb A1c MFr Bld  Date/Time Value Ref Range Status  12/06/2019 03:20 PM 8.6 (H) 4.8 - 5.6 % Final    Comment:    (NOTE) Pre diabetes:          5.7%-6.4% Diabetes:              >6.4% Glycemic control for   <7.0% adults with diabetes   07/13/2018 01:25 PM 8.9 (H) 4.8 - 5.6 % Final    Comment:    (NOTE) Pre diabetes:          5.7%-6.4% Diabetes:              >6.4% Glycemic control for   <7.0% adults with  diabetes     CBG: Recent Labs  Lab 01/03/20 1245 01/03/20 1549 01/03/20 1945 01/13/2020 0012 01/25/2020 0346  GLUCAP 168* 161* 194* 238* 239*       Dr. Jose Persia Internal Medicine PGY-1  Pager: [phone] 01/16/2020, 8:19 AM

## 2020-01-04 NOTE — Op Note (Signed)
[facility] Patient Name: [name] Procedure Date : 01/29/2020 MRN: [id number] Attending MD: Gatha Mayer , MD Date of Birth: 1950-12-13 CSN: [id number] Age: 69 Admit Type: Inpatient Procedure:                Upper GI endoscopy Indications:              Active gastrointestinal bleeding Providers:                Gatha Mayer, MD, Cherylynn Ridges, Technician Referring MD:              Medicines:                Midazolam 2 mg IV, On Precedx and Fentanyl in ICU Complications:            No immediate complications. Estimated Blood Loss:     Estimated blood loss: none. Procedure:                Pre-Anesthesia Assessment:                           - Prior to the procedure, a History and Physical                            was performed, and patient medications and                            allergies were reviewed. The patient's tolerance of                            previous anesthesia was also reviewed. The risks                            and benefits of the procedure and the sedation                            options and risks were discussed with the patient.                            All questions were answered, and informed consent                            was obtained. Prior Anticoagulants: The patient has                            taken no previous anticoagulant or antiplatelet                            agents. ASA Grade Assessment: IV - A patient with                            severe systemic disease that is a constant threat                            to life. After reviewing the risks and benefits,  the patient was deemed in satisfactory condition to                            undergo the procedure.                           After obtaining informed consent, the endoscope was                            passed under direct vision. Throughout the                            procedure, the patient's blood pressure, pulse, and                          oxygen saturations were monitored continuously. The                            [id number] Olympus gastroscope was                            introduced through the mouth, and advanced to the                            second part of duodenum. The upper GI endoscopy was                            accomplished without difficulty. The patient                            tolerated the procedure well. Findings:      Multiple dispersed small erosions with stigmata of recent bleeding were       found in the gastric body. These had small adherent clots and are c/w       gastric tube erosions (from suction)      The exam was otherwise without abnormality.      The cardia and gastric fundus were otherwise normal on retroflexion. Impression:               - Erosive gastropathy with stigmata of recent                            bleeding. Gastric tube suction erosions w/ adherent                            clots - cause of low grade bleeding                           - The examination was otherwise normal.                           - No specimens collected. Images not stored for                            report - malfunction Recommendation:           -  Leave OG tube out for now                           Next step would be Nasoenteric tube (Cor-Track)                            when more stable                           Stay on bid PPI                           As far as anticoagulation - day by day decision -                            not yet - will follow Procedure Code(s):        --- Professional ---                           [phone], Esophagogastroduodenoscopy, flexible,                            transoral; diagnostic, including collection of                            specimen(s) by brushing or washing, when performed                            (separate procedure) Diagnosis Code(s):        --- Professional ---                           K92.2, Gastrointestinal  hemorrhage, unspecified CPT copyright 2019 [employer]. All rights reserved. The codes documented in this report are preliminary and upon coder review may  be revised to meet current compliance requirements. Gatha Mayer, MD 01/16/2020 5:36:03 PM This report has been signed electronically. Number of Addenda: 0

## 2020-01-04 NOTE — Progress Notes (Signed)
Inpatient Diabetes Program Recommendations  AACE/ADA: New Consensus Statement on Inpatient Glycemic Control (2015)  Target Ranges:  Prepandial:   less than 140 mg/dL      Peak postprandial:   less than 180 mg/dL (1-2 hours)      Critically ill patients:  140 - 180 mg/dL   Lab Results  Component Value Date   GLUCAP 175 (H) 01/31/2020   HGBA1C 8.6 (H) 12/28/2019    Review of Glycemic Control Results for [name], [name] (MRN [id number]) as of 01/24/2020 11:27  Ref. Range 01/09/2020 00:12 01/09/2020 03:46 01/05/2020 08:07  Glucose-Capillary Latest Ref Range: 70 - 99 mg/dL 238 (H) 239 (H) 175 (H)   Diabetes history: Type 2 DM Outpatient Diabetes medications: Novolog 8 units QAM/ 10 units QPM, Metformin 1000 mg BID, Toujeo 35 units QD Current orders for Inpatient glycemic control: Levemir 12 units BID, Novolog 2-6 units Q4H  Inpatient Diabetes Program Recommendations:     Consider slightly increasing Levemir to 14 units BID.   Thanks, Bronson Curb, MSN, RNC-OB Diabetes Coordinator [phone] (8a-5p)

## 2020-01-05 ENCOUNTER — Inpatient Hospital Stay (HOSPITAL_COMMUNITY): Payer: Medicare Other

## 2020-01-05 LAB — BASIC METABOLIC PANEL
Anion gap: 12 (ref 5–15)
BUN: 41 mg/dL — ABNORMAL HIGH (ref 8–23)
CO2: 27 mmol/L (ref 22–32)
Calcium: 8.8 mg/dL — ABNORMAL LOW (ref 8.9–10.3)
Chloride: 107 mmol/L (ref 98–111)
Creatinine, Ser: 1.21 mg/dL — ABNORMAL HIGH (ref 0.44–1.00)
GFR calc Af Amer: 53 mL/min — ABNORMAL LOW (ref >60)
GFR calc non Af Amer: 46 mL/min — ABNORMAL LOW (ref >60)
Glucose, Bld: 264 mg/dL — ABNORMAL HIGH (ref 70–99)
Potassium: 3.8 mmol/L (ref 3.5–5.1)
Sodium: 146 mmol/L — ABNORMAL HIGH (ref 135–145)

## 2020-01-05 LAB — GLUCOSE, CAPILLARY
Glucose-Capillary: 119 mg/dL — ABNORMAL HIGH (ref 70–99)
Glucose-Capillary: 175 mg/dL — ABNORMAL HIGH (ref 70–99)
Glucose-Capillary: 227 mg/dL — ABNORMAL HIGH (ref 70–99)
Glucose-Capillary: 232 mg/dL — ABNORMAL HIGH (ref 70–99)
Glucose-Capillary: 250 mg/dL — ABNORMAL HIGH (ref 70–99)
Glucose-Capillary: 257 mg/dL — ABNORMAL HIGH (ref 70–99)

## 2020-01-05 LAB — CULTURE, RESPIRATORY W GRAM STAIN: Gram Stain: NONE SEEN

## 2020-01-05 LAB — CBC
HCT: 27.2 % — ABNORMAL LOW (ref 36.0–46.0)
Hemoglobin: 8.5 g/dL — ABNORMAL LOW (ref 12.0–15.0)
MCH: 29.7 pg (ref 26.0–34.0)
MCHC: 31.3 g/dL (ref 30.0–36.0)
MCV: 95.1 fL (ref 80.0–100.0)
Platelets: 133 10*3/uL — ABNORMAL LOW (ref 150–400)
RBC: 2.86 MIL/uL — ABNORMAL LOW (ref 3.87–5.11)
RDW: 15.1 % (ref 11.5–15.5)
WBC: 8.2 10*3/uL (ref 4.0–10.5)
nRBC: 1.8 % — ABNORMAL HIGH (ref 0.0–0.2)

## 2020-01-05 LAB — MAGNESIUM: Magnesium: 2.3 mg/dL (ref 1.7–2.4)

## 2020-01-05 LAB — CALCIUM, IONIZED: Calcium, Ionized, Serum: 4.9 mg/dL (ref 4.5–5.6)

## 2020-01-05 MED ORDER — INSULIN GLARGINE 100 UNIT/ML ~~LOC~~ SOLN
10.0000 [IU] | Freq: Every day | SUBCUTANEOUS | Status: DC
  Administered 2020-01-05: 10 [IU] via SUBCUTANEOUS
  Filled 2020-01-05: qty 0.1

## 2020-01-05 MED ORDER — INSULIN GLARGINE 100 UNIT/ML ~~LOC~~ SOLN
10.0000 [IU] | Freq: Once | SUBCUTANEOUS | Status: AC
  Administered 2020-01-05: 10 [IU] via SUBCUTANEOUS
  Filled 2020-01-05: qty 0.1

## 2020-01-05 MED ORDER — ASPIRIN EC 81 MG PO TBEC
81.0000 mg | DELAYED_RELEASE_TABLET | Freq: Every day | ORAL | Status: DC
  Administered 2020-01-07: 11:00:00 81 mg via ORAL
  Filled 2020-01-05 (×2): qty 1

## 2020-01-05 MED ORDER — FUROSEMIDE 10 MG/ML IJ SOLN: 40.0000 mg | Freq: Every day | INTRAMUSCULAR | Status: DC

## 2020-01-05 MED ORDER — INSULIN ASPART 100 UNIT/ML ~~LOC~~ SOLN
0.0000 [IU] | SUBCUTANEOUS | Status: DC
  Administered 2020-01-05: 8 [IU] via SUBCUTANEOUS
  Administered 2020-01-05: 09:00:00 5 [IU] via SUBCUTANEOUS
  Administered 2020-01-05: 17:00:00 3 [IU] via SUBCUTANEOUS
  Administered 2020-01-05 (×2): 5 [IU] via SUBCUTANEOUS
  Administered 2020-01-06 – 2020-01-07 (×5): 3 [IU] via SUBCUTANEOUS
  Administered 2020-01-07 (×3): 5 [IU] via SUBCUTANEOUS
  Administered 2020-01-07 – 2020-01-08 (×3): 3 [IU] via SUBCUTANEOUS
  Administered 2020-01-08: 5 [IU] via SUBCUTANEOUS

## 2020-01-05 MED ORDER — SODIUM CHLORIDE 0.9 % IV SOLN
2.0000 g | Freq: Two times a day (BID) | INTRAVENOUS | Status: DC
  Administered 2020-01-05 – 2020-01-06 (×3): 2 g via INTRAVENOUS
  Filled 2020-01-05 (×5): qty 2

## 2020-01-05 MED ORDER — INSULIN ASPART 100 UNIT/ML ~~LOC~~ SOLN
2.0000 [IU] | Freq: Once | SUBCUTANEOUS | Status: AC
  Administered 2020-01-05: 2 [IU] via SUBCUTANEOUS

## 2020-01-05 MED ORDER — INSULIN GLARGINE 100 UNIT/ML ~~LOC~~ SOLN
20.0000 [IU] | Freq: Two times a day (BID) | SUBCUTANEOUS | Status: DC
  Administered 2020-01-05: 20 [IU] via SUBCUTANEOUS
  Filled 2020-01-05 (×3): qty 0.2

## 2020-01-05 NOTE — Progress Notes (Signed)
NAME:  [name], MRN:  [id number], DOB:  1950-12-31, LOS: 4 ADMISSION DATE:  12/09/2019, CONSULTATION DATE:  01/05/20 REFERRING MD: Noemi Chapel MD CHIEF COMPLAINT:  Cardiac arrest   Brief History   Patient is a 69 yr old female with PMHx of three vessel CAD, diabetes, HFpEF, HTN, breast cancer s/p radiation, obesity who presented with four days of dyspnea. Patient admitted with cardiogenic shock in setting of worsening CHF requiring intubation with brief PEA arrest with ROSC with brief CPR and 1 dose epinephrine. PCCM to admit  History of present illness   History obtained via chart review and family at bedside as patient is sedated and mechanically ventilated.   Patient is a 69yr old female with PMHx of three vessel CAD, HFpEF (EF 25-30%), HTN, breast cancer s/p radiation, obesity who presented with four day history of shortness of breath. Patient also noted three day history of chest pain unrelieved by nitroglycerin with associated left arm pain in setting of exertion with associated diaphoresis. Pain subsided with rest. Patient also noted to have lower extremity edema, abdominal bloating and decreased urine output despite medication compliance.   Patient initially found to be significantly hypoxic by EMS requiring NRB en route with improvement of O2 sats to low 90s. Patient improved with breathing treatments with EMS. She continued to have difficulty breathing despite receiving magnesium and solumedrol in the ED. Patient started on BiPAP. CXR with possible fluid overload with elevated BNP and troponins. Patient given small dose of lasix and aspirin. CT chest with bilateral infiltrates concerning for possible PNA, however negative for PE. In the ED, patient became gradually more obtunded and unresponsive on BiPAP requiring intubation. During intubation, patient had brief bradycardic PEA arrest lasting <5 minutes with brief CPR and 1 dose epinephrine with ROSC. Patient admitted to [facility].    Past Medical History   Past Medical History:  Diagnosis Date  . Arthritis    "knees, lower back" (08/18/2017)  . 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 S/p lumpectomy and radiation.  Marland Kitchen CAD (coronary artery disease) 11/07   cath 11/11 :  3V CADsee report   . Cataract    small both eyes  . Chronic lower back pain   . Chronic pain    MR 08/07/10 :  Spondylosis L4-5 with B foraminal narrowing and L4 nerve root enchroachment B.  . Chronic venous insufficiency 2012   Has had full W/U incl ECHO, LFT's, Cr, Umicro, TSH, BNP. Symptomatic treatment.  . Colonic polyp 1995   Colonoscopies 1994, 2000, and 02/02/2011. Last had 9 polyps - Tubular adenoma and tubulovillous was the pathology results without high grade dysplasia  . Diabetes mellitus type II    Insulin dependent. Has worked with [name] R previously.  . Diastolic CHF, chronic ([place])    NL EF by echo 01/2012, Gr I dd  . Essential hypertension    Requires 5 drug therapy and still difficult to control.  Marland Kitchen GERD (gastroesophageal reflux disease)   . Heart murmur   . History of radiation therapy 10/02/18- 11/09/18   Left chest wall SCV, Left chest wall boost, Right chest wall SCV, right chest boost all received 28 fractions for a total dose of 50.04 Gy  . Hyperlipidemia    On daily statin  . Iron deficiency anemia    Ferritin was 12 in 2012. on FeSo4. Has had colon and EGD 02/02/11 that showed mild gastritis and colon polyps.  Marland Kitchen  Obesity    Morbid. HAs worked with [name] T previously.  . OSA (obstructive sleep apnea) 02/16/2007   02/2007 : Moderate AHI 35.6, O2 sat decreased to 65%, CPAP titration to 16 with AHI of 3.6. 02/2014 : Rare respiratory events with sleep disturbance, within normal limits. AHI 0.4 per hour      . OSA on CPAP   . Personal history of chemotherapy   . Personal history of radiation therapy    "to my left breast"  . Refusal of blood transfusions as patient is Jehovah's  Witness   . Seasonal allergies    "grass pollen"   Significant Hospital Events   3/30 - admit to ICU s/p cardiac arrest 4/3 - extubated   Consults:  PCCM Cardiology Gastroenterology  Procedures:  3/30: ETT 4/2 EGD: Erosive gastropathy with stigmata of recent bleeding. Gastric tube erosions with clots adhered.   Significant Diagnostic Tests:  3/30 CTA Chest PE >> No PE, extensive bilateral pulmonary infiltrates w/bilateral pleural effusions R>L, tracheomalacia, postoperative seroma  3/30 Echo >> LVEF 25-30%, global hypokinesis, elevated LVEDP, severe akinesis of LV, inferior, inferoseptal and inferolateral wall; moderately elevated pulmonary artery systolic pressure; mild to moderate elevated MV regurgitation  Micro Data:  3/30 SARS COVID-19, Influenza A/B >> negative  Blood cultures (3/31): NGTD  Antimicrobials:  Vancomycin x1 3/30 Zosyn x1 3/30 Unasyn 4/1-4/3 Cefepime 4/3 >>  Interim history/subjective:    Gastric tube removed yesterday after EGD demonstrated it has contributed to GI bleed. Continues to be febrile overnight with T. Max of 100.6. Hyperglycemia noted overnight with CBG of 257.   Objective   Blood pressure 112/77, pulse 72, temperature 99.5 F (37.5 C), temperature source Bladder, resp. rate 20, height 5\' 1"  (1.549 m), weight 90.7 kg, last menstrual period 07/12/1984, SpO2 97 %. CVP:  [6 mmHg-8 mmHg] 7 mmHg  Vent Mode: PRVC FiO2 (%):  [40 %] 40 % Set Rate:  [2 bmp-20 bmp] 2 bmp Vt Set:  [380 mL] 380 mL PEEP:  [5 cmH20] 5 cmH20 Plateau Pressure:  [20 cmH20-22 cmH20] 21 cmH20   Intake/Output Summary (Last 24 hours) at 01/05/2020 0643 Last data filed at 01/05/2020 0600 Gross per 24 hour  Intake 1683.88 ml  Output 1596 ml  Net 87.88 ml   Filed Weights   01/03/20 0356 01/13/2020 0433 01/05/20 0400  Weight: 94.4 kg 92 kg 90.7 kg    Examination: General: Critically ill woman, sedated and intubated HENT: ET tube in place.  Lungs:Clear to ascultation  bilaterally. No wheezing or rales.  Cardiovascular: Regular rate and rhythm Abdomen: Soft and mildly distended. Decreased BS.  Extremities: No edema in bilateral lower extremities Neuro: Alert this AM. On re-assessment, she is following commands.   Resolved Hospital Problem list     Assessment & Plan:   Acute hypoxic and hypercarbic respiratory failure Multi-factorial: Cardiogenic pulmonary edema + CAP   Continue gentle diuretics as she can tolerate. She has been on Lasix to 40 mg IV BID for past 2 days without renal decline. Given she is euvolemic on examination today though, will transition to Lasix just once per day.  Net volume is up 150 mL in the past 24 hours but net negative of 4L during admission as a whole.  Patient on minimal vent settings this AM, doing well on SBT. She was able to be successfully extubated on 4/3.  CXR continues to show left sided infiltrate. Tracheal aspirate cultures positive for Enterobacter Aerogenes. Susceptibility show sensitive to Cefepime, so Unasyn was transitioned  to Cefepime for better coverage.   Cardiogenic shock in setting or worsening HFrEF NSTEMI, Cardiac arrest  Hx three-vessel CAD Patient presented with worsening HFrEF, noted to have severely reduced EF of 25-30% (previously 45-50% in September 2020).  She has had PCI to LCx but unable to be stented, likely culprit vessel (subacute) Continue to hold heparin secondary to GI bleed.  Continue diuresis as above Maintenance medications started: Metoprolol (4/2) No longer requiring pressor support for +24 hours.   Acute kidney injury: Patient's baseline sCr 0.9-1.0 Creatinine stable today.  Diuresis as above. Trend BMP, urine output  Acute encephalopathy 2/2 respiratory failure and cardiogenic shock  Intermittently requiring Fentanyl, in addition to Precedex, for agitation.  Precedex at 1.2 this AM but was able to be weaned completely prior to extubation.  Encephalopathy significantly  improved on re-examination.    Acute blood loss anemia Upper GI bleed Gastroenterology consulted yesterday. EGD performed and showed erosive gastropathy with erosions near G tube; tube removed.  Hemoglobin stable today.  Patient is a Jehovah's witness; no blood products.  Protonix 40 mg BID Continue to follow serial CBC.   Diabetes mellitus:  A1c 8.6%. Home medications include Metformin. Insulin drip initially --> sliding-scale insulin.  Hyperglycemia in the past 24 hours. SSI increased to moderate. Continue Lantus 10 units BID  Hx Hypertension: Home antihypertensive meds held in setting of cardiogenic shock  Best practice:  Diet: NPO, pending swallow study Pain/Anxiety/Delirium protocol (if indicated): ordered VAP protocol (if indicated): per protocol DVT prophylaxis: SCDs GI prophylaxis: PPI Glucose control: SSI Mobility: Bed bound Code Status: Full code Family Communication: Family updated at bedside on 4/3 Disposition: Plan to transfer to floor.   Labs   CBC: Recent Labs  Lab 12/31/2019 0753 12/14/2019 0818 01/02/20 1603 01/03/20 0350 01/31/2020 0445 01/03/2020 1400 01/05/20 0442  WBC 7.7   < > 15.0* 14.5* 8.3 9.5 8.2  NEUTROABS 5.5  --   --   --   --   --   --   HGB 11.5*   < > 9.5* 9.4* 8.6* 8.9* 8.5*  HCT 37.1   < > 30.2* 29.8* 27.8* 28.6* 27.2*  MCV 94.9   < > 93.5 93.7 94.2 94.1 95.1  PLT 220   < > 199 209 170 150 133*   < > = values in this interval not displayed.    Basic Metabolic Panel: Recent Labs  Lab 01/02/20 0446 01/03/20 0350 01/17/2020 0017 01/23/2020 0445 01/05/20 0442  NA 137 139 141 141 146*  K 3.6 4.1 3.7 3.7 3.8  CL 105 103 102 103 107  CO2 18* 24 28 28 27   GLUCOSE 264* 235* 273* 238* 264*  BUN 40* 45* 34* 35* 41*  CREATININE 1.83* 1.43* 1.15* 1.22* 1.21*  CALCIUM 8.1* 8.5* 8.8* 8.9 8.8*  MG  --  2.3 2.1 2.2 2.3  PHOS  --   --   --  2.9  --    GFR: Estimated Creatinine Clearance: 45.7 mL/min (A) (by C-G formula based on SCr of 1.21  mg/dL (H)). Recent Labs  Lab 12/04/2019 1520 12/31/2019 1925 12/31/2019 1928 12/10/2019 2201 01/02/20 0446 01/03/20 0350 01/06/2020 0445 01/05/2020 1400 01/05/20 0442  WBC  --    < >  --   --    < > 14.5* 8.3 9.5 8.2  LATICACIDVEN 3.8*  --  7.9* 5.2*  --  1.6  --   --   --    < > = values in this interval not displayed.  Liver Function Tests: Recent Labs  Lab 12/21/2019 0753 01/02/20 0446 01/29/2020 0445  AST 43* 212* 45*  ALT 26 166* 97*  ALKPHOS 68 61 58  BILITOT 0.3 0.2* 0.3  PROT 7.7 6.8 6.5  ALBUMIN 3.5 3.0* 2.9*   No results for input(s): LIPASE, AMYLASE in the last 168 hours. No results for input(s): AMMONIA in the last 168 hours.  ABG    Component Value Date/Time   PHART 7.320 (L) 01/02/2020 0119   PCO2ART 34.6 01/02/2020 0119   PO2ART 308.0 (H) 01/02/2020 0119   HCO3 17.9 (L) 01/02/2020 0119   TCO2 19 (L) 01/02/2020 0119   ACIDBASEDEF 8.0 (H) 01/02/2020 0119   O2SAT 100.0 01/02/2020 0119     Coagulation Profile: No results for input(s): INR, PROTIME in the last 168 hours.  Cardiac Enzymes: No results for input(s): CKTOTAL, CKMB, CKMBINDEX, TROPONINI in the last 168 hours.  HbA1C: Hemoglobin A1C  Date/Time Value Ref Range Status  11/29/2019 10:19 AM 8.7 (A) 4.0 - 5.6 % Final  05/31/2019 10:04 AM 8.1 (A) 4.0 - 5.6 % Final   Hgb A1c MFr Bld  Date/Time Value Ref Range Status  12/23/2019 03:20 PM 8.6 (H) 4.8 - 5.6 % Final    Comment:    (NOTE) Pre diabetes:          5.7%-6.4% Diabetes:              >6.4% Glycemic control for   <7.0% adults with diabetes   07/13/2018 01:25 PM 8.9 (H) 4.8 - 5.6 % Final    Comment:    (NOTE) Pre diabetes:          5.7%-6.4% Diabetes:              >6.4% Glycemic control for   <7.0% adults with diabetes     CBG: Recent Labs  Lab 01/16/2020 1139 01/30/2020 1607 01/14/2020 1952 01/05/20 0001 01/05/20 0352  GLUCAP 195* 235* 244* 257* 227*       Dr. Jose Persia Internal Medicine PGY-1  Pager: [phone] 01/05/2020,  6:43 AM

## 2020-01-05 NOTE — Progress Notes (Signed)
Progress Note  Patient Name: [name] Date of Encounter: 01/05/2020  Primary Cardiologist: Minus Breeding, MD   Subjective   Extubated; denies CP  Inpatient Medications    Scheduled Meds: . chlorhexidine gluconate (MEDLINE KIT)  15 mL Mouth Rinse BID  . Chlorhexidine Gluconate Cloth  6 each Topical Q0600  . fentaNYL (SUBLIMAZE) injection  25 mcg Intravenous Once  . furosemide  40 mg Intravenous BID  . insulin aspart  0-15 Units Subcutaneous Q4H  . insulin glargine  10 Units Subcutaneous Once  . insulin glargine  20 Units Subcutaneous BID  . mouth rinse  15 mL Mouth Rinse 10 times per day  . metoprolol tartrate  2.5 mg Intravenous Q8H  . pantoprazole (PROTONIX) IV  40 mg Intravenous Q12H  . rosuvastatin  10 mg Oral Daily  . sodium chloride flush  10-40 mL Intracatheter Q12H   Continuous Infusions: . sodium chloride Stopped (01/03/2020 1637)  . ceFEPime (MAXIPIME) IV Stopped (01/05/20 1029)  . dexmedetomidine (PRECEDEX) IV infusion Stopped (01/05/20 0951)  . fentaNYL infusion INTRAVENOUS 180 mcg/hr (01/05/20 0300)   PRN Meds: Place/Maintain arterial line **AND** sodium chloride, acetaminophen, fentaNYL, fentaNYL (SUBLIMAZE) injection, sennosides, sodium chloride flush   Vital Signs    Vitals:   01/05/20 0930 01/05/20 1000 01/05/20 1030 01/05/20 1100  BP: 132/82 (!) 120/59 118/76 111/80  Pulse:    (!) 133  Resp:      Temp: 99.3 F (37.4 C) 99.3 F (37.4 C) 99.3 F (37.4 C) 99.5 F (37.5 C)  TempSrc:      SpO2:    97%  Weight:      Height:        Intake/Output Summary (Last 24 hours) at 01/05/2020 1131 Last data filed at 01/05/2020 1100 Gross per 24 hour  Intake 1724.85 ml  Output 1131 ml  Net 593.85 ml   Last 3 Weights 01/05/2020 01/12/2020 01/03/2020  Weight (lbs) 199 lb 15.3 oz 202 lb 13.2 oz 208 lb 1.8 oz  Weight (kg) 90.7 kg 92 kg 94.4 kg      Telemetry    Sinus - Personally Reviewed   Physical Exam   GEN: WD obese Neck: JVD difficult to  assess Cardiac: RRR Respiratory: CTA anteriorly GI: Soft, nontender, non-distended  MS: No edema Neuro:  Nonfocal  Psych: Normal affect   Labs    High Sensitivity Troponin:   Recent Labs  Lab 12/13/2019 0753 12/07/2019 1008 12/22/2019 1922 12/21/2019 2103  TROPONINIHS 3,076* 3,427* 3,446* 3,906*      Chemistry Recent Labs  Lab 12/20/2019 0753 12/15/2019 0818 01/02/20 0446 01/03/20 0350 01/13/2020 0017 01/28/2020 0445 01/05/20 0442  NA 138   < > 137   < > 141 141 146*  K 4.1   < > 3.6   < > 3.7 3.7 3.8  CL 102   < > 105   < > 102 103 107  CO2 23   < > 18*   < > '28 28 27  '$ GLUCOSE 245*   < > 264*   < > 273* 238* 264*  BUN 29*   < > 40*   < > 34* 35* 41*  CREATININE 1.43*   < > 1.83*   < > 1.15* 1.22* 1.21*  CALCIUM 9.2   < > 8.1*   < > 8.8* 8.9 8.8*  PROT 7.7  --  6.8  --   --  6.5  --   ALBUMIN 3.5  --  3.0*  --   --  2.9*  --   AST 43*  --  212*  --   --  45*  --   ALT 26  --  166*  --   --  97*  --   ALKPHOS 68  --  61  --   --  58  --   BILITOT 0.3  --  0.2*  --   --  0.3  --   GFRNONAA 38*   < > 28*   < > 49* 45* 46*  GFRAA 44*   < > 32*   < > 57* 53* 53*  ANIONGAP 13   < > 14   < > '11 10 12   '$ < > = values in this interval not displayed.     Hematology Recent Labs  Lab 01/25/2020 0445 01/09/2020 1400 01/05/20 0442  WBC 8.3 9.5 8.2  RBC 2.95* 3.04* 2.86*  HGB 8.6* 8.9* 8.5*  HCT 27.8* 28.6* 27.2*  MCV 94.2 94.1 95.1  MCH 29.2 29.3 29.7  MCHC 30.9 31.1 31.3  RDW 15.1 15.1 15.1  PLT 170 150 133*    BNP Recent Labs  Lab 01/02/2020 0753 01/23/2020 0446  BNP 1,562.2* 2,155.8*     Radiology    DG CHEST PORT 1 VIEW  Result Date: 01/05/2020 CLINICAL DATA:  Pleural effusion, in-patient. EXAM: PORTABLE CHEST 1 VIEW COMPARISON:  Chest x-ray dated 01/12/2020. FINDINGS: Persistent bibasilar opacities, compatible with small pleural effusions and/or atelectasis. Central pulmonary vascular congestion. Upper lungs remain clear. No pneumothorax is seen. Borderline cardiomegaly is  stable. Enteric tube has been removed. Endotracheal tube remains well positioned with tip approximately 3-4 cm above the carina. LEFT IJ central line is adequately positioned with tip at the level of the mid/upper SVC. IMPRESSION: 1. Persistent bibasilar opacities, most likely small pleural effusions and/or atelectasis. 2. Central pulmonary vascular congestion suggesting mild volume overload/CHF. 3. Enteric tube has been removed. Electronically Signed   By: Franki Cabot M.D.   On: 01/05/2020 08:16   DG Chest Port 1 View  Result Date: 01/07/2020 CLINICAL DATA:  69 year old female with history of acute respiratory failure with hypoxia. EXAM: PORTABLE CHEST 1 VIEW COMPARISON:  Chest x-ray 01/03/2020. FINDINGS: An endotracheal tube is in place with tip 3.6 cm above the carina. There is a left-sided internal jugular central venous catheter with tip terminating in the mid superior vena cava. A nasogastric tube is seen extending into the stomach, however, the tip of the nasogastric tube extends below the lower margin of the image. Lung volumes are slightly low. Bibasilar opacities which may reflect areas of atelectasis and/or consolidation. Small bilateral pleural effusions. No pneumothorax. No evidence of pulmonary edema. Heart size is mildly enlarged. Upper mediastinal contours are within normal limits. Aortic atherosclerosis. Surgical clips project over the chest wall and axillary regions bilaterally, likely from prior axillary nodal dissection. IMPRESSION: 1. Support apparatus, as above. 2. Worsening aeration in the lung bases with increasing regions of atelectasis and/or consolidation and small bilateral pleural effusions. 3. Mild cardiomegaly. 4. Aortic atherosclerosis. Electronically Signed   By: Vinnie Langton M.D.   On: 01/20/2020 05:26   Portable Chest x-ray  Result Date: 01/03/2020 CLINICAL DATA:  Intubation. EXAM: PORTABLE CHEST 1 VIEW COMPARISON:  12/07/2019.  CT 12/23/2019. FINDINGS: Endotracheal  tube tip noted 4 cm above the carina. NG tube, left IJ line stable position. Heart size normal. Bibasilar infiltrates/edema and small bilateral pleural effusions again noted. No interim change. Surgical clips both axilla. IMPRESSION: 1. Endotracheal tube tip noted  4 cm above the carina. Lines and tubes in stable position. 2. Cardiomegaly with bibasilar pulmonary infiltrates/edema and small bilateral pleural effusions. No interim change from prior exam. Electronically Signed   By: Marcello Moores  Register   On: 01/03/2020 13:42    Patient Profile     69 y.o. female with past medical history of coronary artery disease, hypertension, hyperlipidemia admitted with non-ST elevation myocardial infarction and acute on chronic combined systolic/diastolic congestive heart failure.  Intubated by critical care medicine.  Suffered a brief PEA arrest in the emergency room.  Patient has known three-vessel coronary artery disease and has been felt not to be a surgical candidate given her refusal to take blood products as she is a Restaurant manager, fast food.  Patient underwent cardiac catheterization September 2020 and was found to have severe three-vessel coronary artery disease.  She had a drug-eluting stent to her proximal LAD.  There was severe stenosis in the first obtuse marginal with moderately severe disease in the circumflex.  She had successful PTCA of the obtuse marginal as a stent could not be placed.    Assessment & Plan    1 acute on chronic combined systolic/diastolic congestive heart failure-continue Lasix at present dose.  Follow renal function.  2 non-ST elevation myocardial infarction-Continue IV metoprolol and statin (change to oral when tolerating).  Will resume aspirin 81 mg daily given history of stent.  Once she has been extubated and it is clear GI bleeding has improved we will need to revisit whether she is a candidate for cardiac catheterization.  At time of admission it was felt that she presented late and  that circumflex which could not be stented previously was the infarct vessel.   3 VDRF-Now extubated  4 pneumonia-possible aspiration at time of arrest.  Antibiotics per critical care medicine.  5 hypertension-continue present medications.  6 acute renal insufficiency-follow renal function closely with diuresis.  7 recent GI bleed-continue Protonix and follow hemoglobin.  For questions or updates, please contact [place] Please consult [email or web address] for contact info under        Signed, Kirk Ruths, MD  01/05/2020, 11:31 AM

## 2020-01-05 NOTE — Progress Notes (Signed)
   Chart review  Hgb relatively stable  No new recommendations  Will follow-up next 1-2 days  Call if something changes and ?  Thanks  Gatha Mayer, MD, [facility] 01/05/2020 8:12 AM

## 2020-01-05 NOTE — Procedures (Signed)
Extubation Procedure Note  Patient Details:   Name: [name] DOB: Dec 14, 1950 MRN: [id number]   Airway Documentation:    Vent end date: 01/05/20 Vent end time: 1138   Evaluation  O2 sats: stable throughout Complications: No apparent complications Patient did tolerate procedure well. Bilateral Breath Sounds: Clear, Diminished   Yes   No distress or stridor noted.  [name] 01/05/2020, 11:44 AM

## 2020-01-05 NOTE — Progress Notes (Signed)
Rehab Admissions Coordinator Note:  Patient was screened by Cleatrice Burke for appropriateness for an Inpatient Acute Rehab Consult per PT recs.   At this time, we are recommending Inpatient Rehab consult. Please place order for consult.  Cleatrice Burke 01/05/2020, 4:58 PM  I can be reached at [phone].

## 2020-01-05 NOTE — Progress Notes (Signed)
Paged PCCM provider. This RN was concerned about 20 units lantus dose for patient with CBG of 119. Verbal order received to give only 5 units of lantus tonight. Will continue to monitor patient.

## 2020-01-05 NOTE — Progress Notes (Signed)
[place] Progress Note Patient Name: [name] DOB: August 29, 1951 MRN: [id number]   Date of Service  01/05/2020  HPI/Events of Note  Notified of CBG 257 on standard scale SSI and Levemir 12 BID. NPO currently as without access.  eICU Interventions  Ordered to increase SSI to moderate scale and give an extra 2 unit subcutaneous regular insulin tonight     Intervention Category Major Interventions: Hyperglycemia - active titration of insulin therapy  [name] 01/05/2020, 12:41 AM

## 2020-01-05 NOTE — Evaluation (Signed)
Physical Therapy Evaluation Patient Details Name: [name] MRN: [id number] DOB: [birth year] Today's Date: 01/05/2020   History of Present Illness  Pt adm with acute systolic heart failure and cardiogenic shock. Pt with brief PEA arrest with CPR. Pt intubated 3/30-4/3. PMH - CAD, PTCA,  DM, HTN, diastolic chf, obesity, breast CA, back pain  Clinical Impression  Pt admitted with above diagnosis and presents to PT with functional limitations due to deficits listed below (See PT problem list). Pt needs skilled PT to maximize independence and safety to allow discharge to CIR for further rehab before returning home.      Follow Up Recommendations CIR    Equipment Recommendations  Other (comment)(To be determined)    Recommendations for Other Services       Precautions / Restrictions Precautions Precautions: Fall      Mobility  Bed Mobility Overal bed mobility: Needs Assistance Bed Mobility: Supine to Sit;Sit to Supine     Supine to sit: Mod assist;HOB elevated Sit to supine: +2 for physical assistance;Max assist   General bed mobility comments: Assist to bring legs off of bed, elevate trunk into sitting and bring hips to EOB. Assist to lower trunk and bring legs back up into bed   Transfers Overall transfer level: Needs assistance Equipment used: 4-wheeled walker Transfers: Sit to/from Stand Sit to Stand: +2 physical assistance;Max assist         General transfer comment: Assist to bring hips up and for balance.   Ambulation/Gait                Stairs            Wheelchair Mobility    Modified Rankin (Stroke Patients Only)       Balance Overall balance assessment: Needs assistance Sitting-balance support: Bilateral upper extremity supported;Feet supported Sitting balance-Leahy Scale: Poor Sitting balance - Comments: Pt kept scooting hips forward and leaning posteriorly and to the right putting her in danger of sliding off of bed.    Standing  balance support: Bilateral upper extremity supported Standing balance-Leahy Scale: Zero Standing balance comment: Stood x 10 sec with +2 max with rollator                             Pertinent Vitals/Pain Pain Assessment: No/denies pain    Home Living Family/patient expects to be discharged to:: Private residence Living Arrangements: Alone Available Help at Discharge: Family;Available PRN/intermittently Type of Home: Apartment Home Access: Level entry     Home Layout: One level Home Equipment: Cane - single point      Prior Function Level of Independence: Independent         Comments: Reports daughter available to come and help if needed     Hand Dominance        Extremity/Trunk Assessment   Upper Extremity Assessment Upper Extremity Assessment: Defer to OT evaluation    Lower Extremity Assessment Lower Extremity Assessment: Generalized weakness       Communication   Communication: No difficulties  Cognition Arousal/Alertness: Awake/alert Behavior During Therapy: Impulsive Overall Cognitive Status: Impaired/Different from baseline Area of Impairment: Orientation;Attention;Memory;Following commands;Safety/judgement;Problem solving                 Orientation Level: Disoriented to;Situation Current Attention Level: Sustained Memory: Decreased recall of precautions;Decreased short-term memory Following Commands: Follows one step commands inconsistently Safety/Judgement: Decreased awareness of safety;Decreased awareness of deficits   Problem Solving: Slow processing;Decreased initiation;Difficulty sequencing;Requires  verbal cues;Requires tactile cues        General Comments      Exercises     Assessment/Plan    PT Assessment Patient needs continued PT services  PT Problem List Decreased strength;Decreased activity tolerance;Decreased balance;Decreased mobility;Decreased cognition;Decreased safety awareness;Obesity       PT  Treatment Interventions DME instruction;Gait training;Functional mobility training;Therapeutic activities;Therapeutic exercise;Balance training;Cognitive remediation;Patient/family education    PT Goals (Current goals can be found in the Care Plan section)  Acute Rehab PT Goals Patient Stated Goal: go home PT Goal Formulation: With patient Time For Goal Achievement: 01/19/20 Potential to Achieve Goals: Fair    Frequency Min 3X/week   Barriers to discharge Decreased caregiver support;Inaccessible home environment stairs to enter and unsure if daughter truly available to stay with patient    Co-evaluation               AM-PAC PT "6 Clicks" Mobility  Outcome Measure Help needed turning from your back to your side while in a flat bed without using bedrails?: A Lot Help needed moving from lying on your back to sitting on the side of a flat bed without using bedrails?: A Lot Help needed moving to and from a bed to a chair (including a wheelchair)?: Total Help needed standing up from a chair using your arms (e.g., wheelchair or bedside chair)?: Total Help needed to walk in hospital room?: Total Help needed climbing 3-5 steps with a railing? : Total 6 Click Score: 8    End of Session Equipment Utilized During Treatment: Oxygen Activity Tolerance: Patient limited by fatigue Patient left: in bed;with call bell/phone within reach;with chair alarm set Nurse Communication: Mobility status PT Visit Diagnosis: Unsteadiness on feet (R26.81);Other abnormalities of gait and mobility (R26.89);Muscle weakness (generalized) (M62.81)    Time: [id number] PT Time Calculation (min) (ACUTE ONLY): 27 min   Charges:   PT Evaluation $PT Eval Moderate Complexity: 1 Mod PT Treatments $Therapeutic Activity: 8-22 mins        [place] Pager [phone] Office [place] 01/05/2020, 4:47 PM

## 2020-01-05 NOTE — Evaluation (Signed)
Clinical/Bedside Swallow Evaluation Patient Details  Name: [name] MRN: [id number] Date of Birth: [birth year]  Today's Date: 01/05/2020 Time: SLP Start Time (ACUTE ONLY): 1505 SLP Stop Time (ACUTE ONLY): 1530 SLP Time Calculation (min) (ACUTE ONLY): 25 min  Past Medical History:  Past Medical History:  Diagnosis Date  . Arthritis    "knees, lower back" (08/18/2017)  . 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 S/p lumpectomy and radiation.  Marland Kitchen CAD (coronary artery disease) 11/07   cath 11/11 :  3V CADsee report   . Cataract    small both eyes  . Chronic lower back pain   . Chronic pain    MR 08/07/10 :  Spondylosis L4-5 with B foraminal narrowing and L4 nerve root enchroachment B.  . Chronic venous insufficiency 2012   Has had full W/U incl ECHO, LFT's, Cr, Umicro, TSH, BNP. Symptomatic treatment.  . Colonic polyp 1995   Colonoscopies 1994, 2000, and 02/02/2011. Last had 9 polyps - Tubular adenoma and tubulovillous was the pathology results without high grade dysplasia  . Diabetes mellitus type II    Insulin dependent. Has worked with [name] R previously.  . Diastolic CHF, chronic ([place])    NL EF by echo 01/2012, Gr I dd  . Essential hypertension    Requires 5 drug therapy and still difficult to control.  Marland Kitchen GERD (gastroesophageal reflux disease)   . Heart murmur   . History of radiation therapy 10/02/18- 11/09/18   Left chest wall SCV, Left chest wall boost, Right chest wall SCV, right chest boost all received 28 fractions for a total dose of 50.04 Gy  . Hyperlipidemia    On daily statin  . Iron deficiency anemia    Ferritin was 12 in 2012. on FeSo4. Has had colon and EGD 02/02/11 that showed mild gastritis and colon polyps.  . Obesity    Morbid. HAs worked with [name] T previously.  . OSA (obstructive sleep apnea) 02/16/2007   02/2007 : Moderate AHI 35.6, O2 sat decreased to 65%, CPAP titration to 16 with AHI of 3.6. 02/2014 : Rare  respiratory events with sleep disturbance, within normal limits. AHI 0.4 per hour      . OSA on CPAP   . Personal history of chemotherapy   . Personal history of radiation therapy    "to my left breast"  . Refusal of blood transfusions as patient is Jehovah's Witness   . Seasonal allergies    "grass pollen"   Past Surgical History:  Past Surgical History:  Procedure Laterality Date  . BREAST LUMPECTOMY Left 2006  . BREAST LUMPECTOMY W/ NEEDLE LOCALIZATION  2006   34 Chemo RX  . CARDIAC CATHETERIZATION N/A 08/15/2015   Procedure: Left Heart Cath and Coronary Angiography;  Surgeon: Troy Sine, MD;  Location: [facility];  Service: Cardiovascular;  Laterality: N/A;  . COLONOSCOPY W/ BIOPSIES AND POLYPECTOMY  1994, 2000, 2012, 26/018  . CORONARY STENT INTERVENTION N/A 06/13/2019   Procedure: CORONARY STENT INTERVENTION;  Surgeon: Burnell Blanks, MD;  Location: [facility];  Service: Cardiovascular;  Laterality: N/A;  . IR IMAGING GUIDED PORT INSERTION  04/17/2018  . IR REMOVAL TUN ACCESS W/ PORT W/O FL MOD SED  11/14/2018  . LEFT HEART CATH AND CORONARY ANGIOGRAPHY N/A 08/19/2017   Procedure: LEFT HEART CATH AND CORONARY ANGIOGRAPHY;  Surgeon: Belva Crome, MD;  Location: [facility];  Service: Cardiovascular;  Laterality: N/A;  . LEFT HEART CATH AND CORONARY ANGIOGRAPHY N/A 06/13/2019   Procedure: LEFT HEART CATH AND CORONARY ANGIOGRAPHY;  Surgeon: Burnell Blanks, MD;  Location: [facility];  Service: Cardiovascular;  Laterality: N/A;  . MASTECTOMY MODIFIED RADICAL Bilateral 07/20/2018  . MASTECTOMY MODIFIED RADICAL Bilateral 07/20/2018   Procedure: BILATERAL MODIFIED RADICAL MASTECTOMIES;  Surgeon: Jovita Kussmaul, MD;  Location: [facility];  Service: General;  Laterality: Bilateral;  . PORTACATH PLACEMENT N/A 04/14/2018   Procedure: ATTEMPED INSERTION PORT-A-CATH;  Surgeon: Jovita Kussmaul, MD;  Location: [facility];  Service: General;  Laterality: N/A;   . TONSILLECTOMY AND ADENOIDECTOMY  1964  . TOTAL ABDOMINAL HYSTERECTOMY  1985   "I had endometrosis"  . ULTRASOUND GUIDANCE FOR VASCULAR ACCESS  08/19/2017   Procedure: Ultrasound Guidance For Vascular Access;  Surgeon: Belva Crome, MD;  Location: [facility];  Service: Cardiovascular;;   HPI:  Pt adm with acute systolic heart failure and cardiogenic shock. Pt with brief PEA arrest with CPR. Pt intubated 3/30-4/3. PMH - CAD, PTCA,  DM, HTN, diastolic chf, obesity, breast CA, back pain   Assessment / Plan / Recommendation Clinical Impression  Patient presents with a mild pharyngeal dysphagia s/p 5 days of intubation, but without any overt s/s of aspiration or penetration. Voice was clear and became stronger as session progressed. Patient exhibited a mild delay in swallow initiation but also she would frequently lose focus and would be slow to respond, so it was difficult to determine a true swallow initiation delay versus cognitive-based holding of liquids. SLP Visit Diagnosis: Dysphagia, unspecified (R13.10)    Aspiration Risk  Mild aspiration risk    Diet Recommendation Thin liquid;Dysphagia 1 (Puree)(full liquids, thin)   Liquid Administration via: Cup;Straw Medication Administration: Whole meds with puree Supervision: Patient able to self feed;Staff to assist with self feeding;Intermittent supervision to cue for compensatory strategies Compensations: Minimize environmental distractions;Small sips/bites;Slow rate Postural Changes: Seated upright at 90 degrees    Other  Recommendations Oral Care Recommendations: Oral care BID;Staff/trained caregiver to provide oral care   Follow up Recommendations Other (comment)(TBD)      Frequency and Duration min 2x/week  1 week       Prognosis Prognosis for Safe Diet Advancement: Good      Swallow Study   General Date of Onset: 01/05/20 HPI: Pt adm with acute systolic heart failure and cardiogenic shock. Pt with brief PEA  arrest with CPR. Pt intubated 3/30-4/3. PMH - CAD, PTCA,  DM, HTN, diastolic chf, obesity, breast CA, back pain Type of Study: Bedside Swallow Evaluation Previous Swallow Assessment: N/A Diet Prior to this Study: NPO Temperature Spikes Noted: No Respiratory Status: Nasal cannula History of Recent Intubation: Yes Length of Intubations (days): 5 days Date extubated: 01/05/20 Behavior/Cognition: Alert;Cooperative;Pleasant mood;Confused;Lethargic/Drowsy Oral Cavity Assessment: Within Functional Limits Oral Care Completed by SLP: Yes Oral Cavity - Dentition: Adequate natural dentition Self-Feeding Abilities: Able to feed self;Needs assist;Needs set up Patient Positioning: Upright in bed Baseline Vocal Quality: Low vocal intensity Volitional Cough: Strong Volitional Swallow: Able to elicit    Oral/Motor/Sensory Function Overall Oral Motor/Sensory Function: Within functional limits   Ice Chips Ice chips: Within functional limits Presentation: Spoon   Thin Liquid Thin Liquid: Impaired Presentation: Straw Pharyngeal  Phase Impairments: Suspected delayed Swallow Other Comments: Difficult to determine if delayed swallow initiation or patient holding liquids as she would frequently have delays in response time    Nectar Thick     Honey Thick  Puree Puree: Within functional limits   Solid     Solid: Not tested Other Comments: Patient declined and said she didnt think she could tolerate solids at this time        Sonia Baller, MA, CCC-SLP Speech Therapy [facility] Acute [facility]hab

## 2020-01-05 NOTE — Progress Notes (Signed)
eLink Physician-Brief Progress Note Patient Name: [name] DOB: 27-Jun-1951 MRN: [id number]   Date of Service  01/05/2020  HPI/Events of Note  Notified of glucose 119 and patient is scheduled to receive Lantus 20 She has received Lantus 10 at noon today for glucose in the 200s but at that time patient was on tube feeding while intubated.  eICU Interventions  Decrease Lantus to 5 for tonight while patient is just on clear liquids. Increase dose accordingly once tolerating PO intake     Intervention Category Major Interventions: Hyperglycemia - active titration of insulin therapy  [name] 01/05/2020, 10:45 PM

## 2020-01-05 NOTE — Progress Notes (Signed)
   [facility] was paged regarding transferring pt from ICU to teaching service.  [name] is a 69 year old female with past medical history of HFrEF, HTN, CAD who presented with acute on chronic combined CHF, who presented with shortness of breath. She was admitted in ICU due to cardiogenic shock and required intubation extubated today. She had GI bleeding that controled and resolved. Also gets treatment for aspiration PNA.  Now extubated, stable off of pressors, no further GI bleeding. AKI is improving.  Patient will be transferred to IMTS service tomorrow AM 01/06/2020.  Dewayne Hatch, MD 01/05/2020, 3:11 PM Pager: @[email or web address] @

## 2020-01-06 ENCOUNTER — Inpatient Hospital Stay: Payer: Self-pay

## 2020-01-06 ENCOUNTER — Inpatient Hospital Stay (HOSPITAL_COMMUNITY): Payer: Medicare Other

## 2020-01-06 DIAGNOSIS — D62 Acute posthemorrhagic anemia: Secondary | ICD-10-CM

## 2020-01-06 DIAGNOSIS — N179 Acute kidney failure, unspecified: Secondary | ICD-10-CM

## 2020-01-06 DIAGNOSIS — Z7982 Long term (current) use of aspirin: Secondary | ICD-10-CM

## 2020-01-06 DIAGNOSIS — Z794 Long term (current) use of insulin: Secondary | ICD-10-CM

## 2020-01-06 DIAGNOSIS — J690 Pneumonitis due to inhalation of food and vomit: Secondary | ICD-10-CM

## 2020-01-06 DIAGNOSIS — Z79899 Other long term (current) drug therapy: Secondary | ICD-10-CM

## 2020-01-06 LAB — POCT I-STAT 7, (LYTES, BLD GAS, ICA,H+H)
Acid-base deficit: 5 mmol/L — ABNORMAL HIGH (ref 0.0–2.0)
Bicarbonate: 17.7 mmol/L — ABNORMAL LOW (ref 20.0–28.0)
Calcium, Ion: 1.12 mmol/L — ABNORMAL LOW (ref 1.15–1.40)
HCT: 28 % — ABNORMAL LOW (ref 36.0–46.0)
Hemoglobin: 9.5 g/dL — ABNORMAL LOW (ref 12.0–15.0)
O2 Saturation: 98 %
Potassium: 4.1 mmol/L (ref 3.5–5.1)
Sodium: 140 mmol/L (ref 135–145)
TCO2: 19 mmol/L — ABNORMAL LOW (ref 22–32)
pCO2 arterial: 25.3 mmHg — ABNORMAL LOW (ref 32.0–48.0)
pH, Arterial: 7.454 — ABNORMAL HIGH (ref 7.350–7.450)
pO2, Arterial: 93 mmHg (ref 83.0–108.0)

## 2020-01-06 LAB — COMPREHENSIVE METABOLIC PANEL WITH GFR
ALT: 609 U/L — ABNORMAL HIGH (ref 0–44)
AST: 804 U/L — ABNORMAL HIGH (ref 15–41)
Albumin: 3 g/dL — ABNORMAL LOW (ref 3.5–5.0)
Alkaline Phosphatase: 67 U/L (ref 38–126)
Anion gap: 23 — ABNORMAL HIGH (ref 5–15)
BUN: 54 mg/dL — ABNORMAL HIGH (ref 8–23)
CO2: 16 mmol/L — ABNORMAL LOW (ref 22–32)
Calcium: 8.7 mg/dL — ABNORMAL LOW (ref 8.9–10.3)
Chloride: 105 mmol/L (ref 98–111)
Creatinine, Ser: 1.99 mg/dL — ABNORMAL HIGH (ref 0.44–1.00)
GFR calc Af Amer: 29 mL/min — ABNORMAL LOW
GFR calc non Af Amer: 25 mL/min — ABNORMAL LOW
Glucose, Bld: 206 mg/dL — ABNORMAL HIGH (ref 70–99)
Potassium: 4.2 mmol/L (ref 3.5–5.1)
Sodium: 144 mmol/L (ref 135–145)
Total Bilirubin: 1.1 mg/dL (ref 0.3–1.2)
Total Protein: 6.7 g/dL (ref 6.5–8.1)

## 2020-01-06 LAB — CBC
HCT: 36 % (ref 36.0–46.0)
Hemoglobin: 10.6 g/dL — ABNORMAL LOW (ref 12.0–15.0)
MCH: 29.4 pg (ref 26.0–34.0)
MCHC: 29.4 g/dL — ABNORMAL LOW (ref 30.0–36.0)
MCV: 99.7 fL (ref 80.0–100.0)
Platelets: 145 10*3/uL — ABNORMAL LOW (ref 150–400)
RBC: 3.61 MIL/uL — ABNORMAL LOW (ref 3.87–5.11)
RDW: 15.8 % — ABNORMAL HIGH (ref 11.5–15.5)
WBC: 15.7 10*3/uL — ABNORMAL HIGH (ref 4.0–10.5)
nRBC: 10.1 % — ABNORMAL HIGH (ref 0.0–0.2)

## 2020-01-06 LAB — BASIC METABOLIC PANEL
Anion gap: 20 — ABNORMAL HIGH (ref 5–15)
BUN: 43 mg/dL — ABNORMAL HIGH (ref 8–23)
CO2: 18 mmol/L — ABNORMAL LOW (ref 22–32)
Calcium: 8.7 mg/dL — ABNORMAL LOW (ref 8.9–10.3)
Chloride: 105 mmol/L (ref 98–111)
Creatinine, Ser: 1.72 mg/dL — ABNORMAL HIGH (ref 0.44–1.00)
GFR calc Af Amer: 35 mL/min — ABNORMAL LOW (ref >60)
GFR calc non Af Amer: 30 mL/min — ABNORMAL LOW (ref >60)
Glucose, Bld: 197 mg/dL — ABNORMAL HIGH (ref 70–99)
Potassium: 3.5 mmol/L (ref 3.5–5.1)
Sodium: 143 mmol/L (ref 135–145)

## 2020-01-06 LAB — GLUCOSE, CAPILLARY
Glucose-Capillary: 103 mg/dL — ABNORMAL HIGH (ref 70–99)
Glucose-Capillary: 115 mg/dL — ABNORMAL HIGH (ref 70–99)
Glucose-Capillary: 119 mg/dL — ABNORMAL HIGH (ref 70–99)
Glucose-Capillary: 139 mg/dL — ABNORMAL HIGH (ref 70–99)
Glucose-Capillary: 174 mg/dL — ABNORMAL HIGH (ref 70–99)
Glucose-Capillary: 175 mg/dL — ABNORMAL HIGH (ref 70–99)
Glucose-Capillary: 191 mg/dL — ABNORMAL HIGH (ref 70–99)

## 2020-01-06 LAB — TROPONIN I (HIGH SENSITIVITY)
Troponin I (High Sensitivity): 11191 ng/L (ref <18)
Troponin I (High Sensitivity): 9321 ng/L

## 2020-01-06 LAB — BLOOD GAS, ARTERIAL
Acid-base deficit: 10.3 mmol/L — ABNORMAL HIGH (ref 0.0–2.0)
Bicarbonate: 13.8 mmol/L — ABNORMAL LOW (ref 20.0–28.0)
Drawn by: 24686
FIO2: 44
O2 Saturation: 84.4 %
Patient temperature: 37
pCO2 arterial: 23.9 mmHg — ABNORMAL LOW (ref 32.0–48.0)
pH, Arterial: 7.381 (ref 7.350–7.450)
pO2, Arterial: 58.1 mmHg — ABNORMAL LOW (ref 83.0–108.0)

## 2020-01-06 LAB — LACTIC ACID, PLASMA
Lactic Acid, Venous: >11 mmol/L (ref 0.5–1.9)
Lactic Acid, Venous: 5 mmol/L (ref 0.5–1.9)

## 2020-01-06 MED ORDER — WHITE PETROLATUM EX OINT
TOPICAL_OINTMENT | CUTANEOUS | Status: AC
  Administered 2020-01-06: 0.2
  Filled 2020-01-06: qty 28.35

## 2020-01-06 MED ORDER — DIPHENHYDRAMINE HCL 50 MG/ML IJ SOLN
25.0000 mg | Freq: Once | INTRAMUSCULAR | Status: AC
  Administered 2020-01-06: 02:00:00 25 mg via INTRAMUSCULAR
  Filled 2020-01-06: qty 1

## 2020-01-06 MED ORDER — ONDANSETRON HCL 4 MG/2ML IJ SOLN: 4.0000 mg | Freq: Once | INTRAMUSCULAR | Status: DC

## 2020-01-06 MED ORDER — PROMETHAZINE HCL 25 MG/ML IJ SOLN
12.5000 mg | Freq: Four times a day (QID) | INTRAMUSCULAR | Status: DC | PRN
  Administered 2020-01-06 – 2020-01-08 (×2): 12.5 mg via INTRAVENOUS
  Filled 2020-01-06 (×3): qty 1

## 2020-01-06 MED ORDER — IPRATROPIUM-ALBUTEROL 0.5-2.5 (3) MG/3ML IN SOLN: 3.0000 mL | Freq: Four times a day (QID) | RESPIRATORY_TRACT | Status: DC | PRN

## 2020-01-06 MED ORDER — LIP MEDEX EX OINT
TOPICAL_OINTMENT | CUTANEOUS | Status: DC | PRN
  Filled 2020-01-06: qty 7

## 2020-01-06 MED ORDER — SODIUM CHLORIDE 0.9 % IV SOLN
2.0000 g | INTRAVENOUS | Status: DC
  Administered 2020-01-07 – 2020-01-08 (×2): 2 g via INTRAVENOUS
  Filled 2020-01-06 (×2): qty 2

## 2020-01-06 MED ORDER — FUROSEMIDE 10 MG/ML IJ SOLN
40.0000 mg | Freq: Once | INTRAMUSCULAR | Status: AC
  Administered 2020-01-06: 40 mg via INTRAVENOUS
  Filled 2020-01-06: qty 4

## 2020-01-06 MED ORDER — FUROSEMIDE 10 MG/ML IJ SOLN
80.0000 mg | Freq: Two times a day (BID) | INTRAMUSCULAR | Status: DC
  Administered 2020-01-06 – 2020-01-07 (×3): 80 mg via INTRAVENOUS
  Filled 2020-01-06 (×3): qty 8

## 2020-01-06 MED ORDER — MILRINONE LACTATE IN DEXTROSE 20-5 MG/100ML-% IV SOLN
0.2500 ug/kg/min | INTRAVENOUS | Status: DC
  Administered 2020-01-06 – 2020-01-08 (×4): 0.25 ug/kg/min via INTRAVENOUS
  Filled 2020-01-06 (×4): qty 100

## 2020-01-06 MED ORDER — FUROSEMIDE 10 MG/ML IJ SOLN: 40.0000 mg | Freq: Two times a day (BID) | INTRAMUSCULAR | Status: DC

## 2020-01-06 MED ORDER — NOREPINEPHRINE 4 MG/250ML-% IV SOLN
2.0000 ug/min | INTRAVENOUS | Status: DC
  Administered 2020-01-06: 11:00:00 2 ug/min via INTRAVENOUS
  Filled 2020-01-06 (×2): qty 250

## 2020-01-06 MED ORDER — SODIUM CHLORIDE 0.9 % IV SOLN
250.0000 mL | INTRAVENOUS | Status: DC
  Administered 2020-01-06: 11:00:00 250 mL via INTRAVENOUS

## 2020-01-06 NOTE — Progress Notes (Addendum)
Progress Note  Patient Name: [name] Date of Encounter: 01/06/2020  Primary Cardiologist: Minus Breeding, MD   Subjective   Events noted; patient became more dyspneic last evening and was transferred back to the unit.  She now describes dyspnea but denies chest pain.  Inpatient Medications    Scheduled Meds: . aspirin EC  81 mg Oral Daily  . Chlorhexidine Gluconate Cloth  6 each Topical Q0600  . fentaNYL (SUBLIMAZE) injection  25 mcg Intravenous Once  . furosemide  80 mg Intravenous BID  . insulin aspart  0-15 Units Subcutaneous Q4H  . insulin glargine  20 Units Subcutaneous BID  . mouth rinse  15 mL Mouth Rinse 10 times per day  . pantoprazole (PROTONIX) IV  40 mg Intravenous Q12H  . rosuvastatin  10 mg Oral Daily  . sodium chloride flush  10-40 mL Intracatheter Q12H   Continuous Infusions: . sodium chloride 10 mL/hr at 01/05/20 2139  . sodium chloride    . ceFEPime (MAXIPIME) IV 2 g (01/05/20 2139)  . norepinephrine (LEVOPHED) Adult infusion     PRN Meds: Place/Maintain arterial line **AND** sodium chloride, acetaminophen, fentaNYL, fentaNYL (SUBLIMAZE) injection, promethazine, sennosides, sodium chloride flush   Vital Signs    Vitals:   01/06/20 0747 01/06/20 0800 01/06/20 0813 01/06/20 0825  BP:  97/66 94/67 96/66   Pulse:      Resp:  (!) 33 (!) 31 (!) 38  Temp: 97.6 F (36.4 C)     TempSrc: Oral     SpO2:      Weight:      Height:        Intake/Output Summary (Last 24 hours) at 01/06/2020 0958 Last data filed at 01/05/2020 2152 Gross per 24 hour  Intake 601.17 ml  Output 700 ml  Net -98.83 ml   Last 3 Weights 01/06/2020 01/05/2020 01/05/2020  Weight (lbs) 204 lb 207 lb 14.3 oz 199 lb 15.3 oz  Weight (kg) 92.534 kg 94.3 kg 90.7 kg      Telemetry    Sinus to sinus tachycardia - Personally Reviewed   Physical Exam   GEN: WD obese; mild respiratory distress Neck: JVD difficult to assess; supple Cardiac: RRR Respiratory: diminished BS bases GI:  Soft, NT/ND MS: 1+ edema Neuro: grossly intact Psych: Normal affect   Labs    High Sensitivity Troponin:   Recent Labs  Lab 12/21/2019 0753 12/15/2019 1008 12/22/2019 1922 12/05/2019 2103  TROPONINIHS 3,076* 3,427* 3,446* 3,906*      Chemistry Recent Labs  Lab 12/06/2019 0753 12/20/2019 0818 01/02/20 0446 01/03/20 0350 01/06/2020 0445 01/05/20 0442 01/06/20 0504  NA 138   < > 137   < > 141 146* 143  K 4.1   < > 3.6   < > 3.7 3.8 3.5  CL 102   < > 105   < > 103 107 105  CO2 23   < > 18*   < > 28 27 18*  GLUCOSE 245*   < > 264*   < > 238* 264* 197*  BUN 29*   < > 40*   < > 35* 41* 43*  CREATININE 1.43*   < > 1.83*   < > 1.22* 1.21* 1.72*  CALCIUM 9.2   < > 8.1*   < > 8.9 8.8* 8.7*  PROT 7.7  --  6.8  --  6.5  --   --   ALBUMIN 3.5  --  3.0*  --  2.9*  --   --  AST 43*  --  212*  --  45*  --   --   ALT 26  --  166*  --  97*  --   --   ALKPHOS 68  --  61  --  58  --   --   BILITOT 0.3  --  0.2*  --  0.3  --   --   GFRNONAA 38*   < > 28*   < > 45* 46* 30*  GFRAA 44*   < > 32*   < > 53* 53* 35*  ANIONGAP 13   < > 14   < > 10 12 20*   < > = values in this interval not displayed.     Hematology Recent Labs  Lab 01/20/2020 1400 01/05/20 0442 01/06/20 0812  WBC 9.5 8.2 15.7*  RBC 3.04* 2.86* 3.61*  HGB 8.9* 8.5* 10.6*  HCT 28.6* 27.2* 36.0  MCV 94.1 95.1 99.7  MCH 29.3 29.7 29.4  MCHC 31.1 31.3 29.4*  RDW 15.1 15.1 15.8*  PLT 150 133* 145*    BNP Recent Labs  Lab 12/24/2019 0753 01/12/2020 0446  BNP 1,562.2* 2,155.8*     Radiology    DG CHEST PORT 1 VIEW  Result Date: 01/06/2020 CLINICAL DATA:  Hypoxia EXAM: PORTABLE CHEST 1 VIEW COMPARISON:  01/05/2020 FINDINGS: Mild cardiomegaly. Increased right lower lung opacities. No pneumothorax or sizable pleural effusion. Endotracheal tube has been removed. Left IJ central venous catheter has also been removed. IMPRESSION: Increased right lower lung opacities. Electronically Signed   By: Ulyses Jarred M.D.   On: 01/06/2020 05:23     DG CHEST PORT 1 VIEW  Result Date: 01/05/2020 CLINICAL DATA:  Pleural effusion, in-patient. EXAM: PORTABLE CHEST 1 VIEW COMPARISON:  Chest x-ray dated 01/15/2020. FINDINGS: Persistent bibasilar opacities, compatible with small pleural effusions and/or atelectasis. Central pulmonary vascular congestion. Upper lungs remain clear. No pneumothorax is seen. Borderline cardiomegaly is stable. Enteric tube has been removed. Endotracheal tube remains well positioned with tip approximately 3-4 cm above the carina. LEFT IJ central line is adequately positioned with tip at the level of the mid/upper SVC. IMPRESSION: 1. Persistent bibasilar opacities, most likely small pleural effusions and/or atelectasis. 2. Central pulmonary vascular congestion suggesting mild volume overload/CHF. 3. Enteric tube has been removed. Electronically Signed   By: Franki Cabot M.D.   On: 01/05/2020 08:16    Patient Profile     69 y.o. female with past medical history of coronary artery disease, hypertension, hyperlipidemia admitted with non-ST elevation myocardial infarction and acute on chronic combined systolic/diastolic congestive heart failure.  Intubated by critical care medicine.  Suffered a brief PEA arrest in the emergency room.  Patient has known three-vessel coronary artery disease and has been felt not to be a surgical candidate given her refusal to take blood products as she is a Restaurant manager, fast food.  Patient underwent cardiac catheterization September 2020 and was found to have severe three-vessel coronary artery disease.  She had a drug-eluting stent to her proximal LAD.  There was severe stenosis in the first obtuse marginal with moderately severe disease in the circumflex.  She had successful PTCA of the obtuse marginal as a stent could not be placed.    Assessment & Plan    1 acute on chronic combined systolic/diastolic congestive heart failure/cardiogenic shock-events noted.  Patient developed recurrent pulmonary  edema and dyspnea after extubation yesterday.  She presently denies chest pain.  We will continue Lasix 80 mg IV twice daily.  She has been placed on norepinephrine for hypotension.  I discussed patient with Dr. Lamonte Sakai.  Family is discussing aggressiveness of care.  Ideally would place central line or PICC for coox.  For now we will begin IV milrinone.  Given recurrent pulmonary edema will recycle enzymes.  At present patient would be a very poor candidate for cardiac catheterization.  As outlined in previous notes her circumflex could not be stented previously.  She also had recent GI bleeding and therefore anticoagulation could not be used.  2 non-ST elevation myocardial infarction-continue aspirin and statin.  No heparin in the setting of GI bleed.  Discontinue beta-blocker in setting of cardiogenic shock/hypotension.  She is not presently a candidate for cardiac catheterization as outlined above.  Her electrocardiogram does not show ST elevation.  We could revisit this in the future if she improves to evaluate patency of previously placed LAD stent.    3 pneumonia-possible aspiration at time of arrest.  Antibiotics per critical care medicine.  4 acute renal insufficiency-follow renal function closely with diuresis.  5 recent GI bleed-continue Protonix and follow hemoglobin.  For questions or updates, please contact [place] Please consult [email or web address] for contact info under        Signed, Kirk Ruths, MD  01/06/2020, 9:58 AM

## 2020-01-06 NOTE — Progress Notes (Signed)
Unable to place patient on BiPAP per order due to patient actively vomiting.

## 2020-01-06 NOTE — Progress Notes (Addendum)
Subjective:  [name] is a 69 y.o. with PMH of HFrEF (EF 25-30), HTN, CAD, Obesity admit for dyspnea on hospital day 5  [name] was examined and evaluated at bedside this am. She was noted to be in respiratory distress, diaphoretic. She was AAOx2 and noted to have difficulty completting sentences. She mentions how her 'Jehovah' will save her. She denies any chest pain, fevers, chills, palpitations but endorse significant dyspnea and orthopnea.  Objective:  Vital signs in last 24 hours: Vitals:   01/06/20 0526 01/06/20 0627 01/06/20 0730 01/06/20 0747  BP:   108/79   Pulse:      Resp:  (!) 29 (!) 33   Temp:    97.6 F (36.4 C)  TempSrc:    Oral  SpO2:  94%    Weight: 92.5 kg     Height:       Gen: Well-developed, obese, In distress HEENT: NCAT head, hearing intact, EOMI, PERRL, No nasal discharge, MMM Neck: supple, ROM intact, wide neck CV: Tachycardic, regular rhythm, No rubs, no murmurs, no gallops Pulm: Right sided crackles, expiratory wheezing, + accessory muscle use, tachypneic Abd: Soft, BS+, NTND, No rebound, no guarding Extm: ROM intact, Peripheral pulses intact, trace pitting dema Skin: Diaphoretic, extremities cool and clammy  Assessment/Plan:  Principal Problem:   Acute hypoxemic respiratory failure (HCC) Active Problems:   CAD (coronary artery disease)   Morbid obesity ([place])   DM type 2, uncontrolled, with retinopathy ([place])   Decompensated heart failure (HCC)   NSTEMI (non-ST elevated myocardial infarction) ([place])   Cardiac arrest (HCC)   Acute on chronic systolic congestive heart failure (HCC)   Erosive gastritis with hemorrhage  [name] is a 69 y.o. with PMH of HFrEF (EF 25-30), HTN, CAD, Obesity admit for acute on chronic systolic heart failure  Acute hypoxic respiratory failure Acute on chronic systolic heart failure In significant resp distress this am. X-ray w/ RLL opacities and vascular congestion. Tachypneic with increasing oxygen  requirement (6L currently). Given IV furosemide 40mg  3 hours prior but no significant output so far (150cc per nursing staff). Per chart review, extubated yesterday and transferred from ICU. Metoprolol given overnight. Worsening respiratory status likely due to worsening edema in setting of low output heart failure. BP stable but extremities poorly perfused. May need pressor support. - Stat ABG (pH 7.38, pCO2 23.9, pO2 58.1, Bicarb 13.8) consistent w/ metabolic acidosis + resp alkalosis - Bipap  - Lactate - Stop metoprolol - C/w Iv furosemide 40mg  - Appreciate PCCM/Cardiology assistance  NSTEMI CAD Hx of PCI to LCx. EF 25-30%. Thought to be poor candidate for cardiac catherization at admission due to blood loss anemia. Heparin stopped 2/2 GI bleed. Per chart review, cardiology planning to reassess once GI bleed resolved.  - Appreciate cardiology assistance - C/w aspirin 81mg  daily, rosuvastatin 10mg   Acute kidney injury Baseline Cr 0.9-1.0. Rising creatinine in setting of active diuresis. BUN 43, Creatinine 1.15->1.22->1.72. No urine output recorded overnight. Nursing at bedside mentions only about 150cc. Hypervolemic on exam - Trend renal fx - Monitor urine output  Acute blood loss anemia Upper GI bleed Cbc dropped from 11.5->9 with blood in NG tube. EGD performed showing erosive gastropathy with resolved bleed. Likely complication during insertion of nasogastric tube. Currently stable with hgb 8.5 yesterday. Am cbc pending. - Trend cbc - C/w protonix 40mg  BID  Diabetes mellitus:  A1c 8.6% - Lantus 10 units - SSI - Glucose checks  DVT prophx: SCDs Diet: Liquid Bowel: Miralax  Code: Full  Dispo: Anticipated discharge in approximately 4-5 day(s).   Mosetta Anis, MD 01/06/2020, 8:30 AM Pager: [phone]

## 2020-01-06 NOTE — Progress Notes (Signed)
Patient is currently confused and therefore, I do not think she has the capacity to make a decision on her code status.   I spoke with the patient's daughter, [name] after the patient was transferred to the ICU. We discussed that this is likely secondary to her heart and that we are going to trial the patient on BiPAP. However, there is a risk that the patient will not tolerate BiPAP and many need to be intubated. When asked if the daughter thought her mother would want this she states that she does not think her mother would want to be intubated. She acknowledges that she seemed very uncomfortable on MV; however, she is not comfortable making that decision without talking to her sister. We then discussed CPR and further interventions including central line placement and pressor support. At this time they would want CPR, central lines, and vasoactive medications if needed.   At this time the patient will remain FULL CODE until Reuel Boom has had the opportunity to talk with her sister about whether the patient should be transitioned to PARTIAL CODE (DNI).   Ina Homes, MD  [facility] PGY3  Pager: [phone]

## 2020-01-06 NOTE — Progress Notes (Signed)
[facility] Progress Note Patient Name: [name] DOB: 08/24/1951 MRN: [id number]   Date of Service  01/06/2020  HPI/Events of Note  Request for sleep aid but patient unable to take PO. Im not able to see patient remotely in this room.  eICU Interventions  Ordered benadryl but alerted with prolonged QT and PEA arrest. Ordered EKG before deciding if diphenhydramine may be given.     Intervention Category Minor Interventions: Other:  [name] 01/06/2020, 12:34 AM

## 2020-01-06 NOTE — Significant Event (Signed)
Rapid Response Event Note  Overview: Follow Up - Ongoing Respiratory Distress   Initial Focused Assessment: I came up to the 3E to see how Ms. Maseda was doing this morning. Upon arrival, IMTS MDs were at the bedside. Patient was being transferred back to the ICU. Patient was awake, conversant, but in fairly significant respiratory distress. RR 40s, + accessory muscle use, shallow - rapid breathing.   +SOB/WOB. Lung sounds - diminished in the bases. [place] 6L - saturations were not picking up - hands were very cool to touch, patient was quite diaphoretic and clammy. I placed on the patient on NRB 15L, BP 97/66(75), HR 122, and RR 36-40. Trace edema in extremities. + Dry heaving and vomiting throughout the night and this morning.  Interventions: -- Urgent transfer to 2M12  Plan of Care: -- Urgent transfer to 2M12  Event Summary:  Start Time 0805 End Time 0845  Netty Starring

## 2020-01-06 NOTE — H&P (Addendum)
PULMONARY / CRITICAL CARE MEDICINE   NAME:  [name], MRN:  [id number], DOB:  05-28-1951, LOS: 5 ADMISSION DATE:  12/30/2019, CONSULTATION DATE:  01/06/2020 REFERRING MD:  Dr. Truman Hayward, CHIEF COMPLAINT:  Dyspnea  BRIEF HISTORY:    Patient is a 69 yr old female with PMHx of three vessel CAD, diabetes, HFpEF, HTN, breast cancer s/p radiation, obesity who presented with four days of dyspnea. Patient admitted with cardiogenic shock in setting of worsening CHF requiring intubation with brief PEA arrest with ROSC with brief CPR and 1 dose epinephrine. PCCM to admit.  HISTORY OF PRESENT ILLNESS   History obtained via chart review and family at bedside as patient is sedated and mechanically ventilated.   Patient is a 69yr old female with PMHx of three vessel CAD, HFpEF (EF 25-30%), HTN, breast cancer s/p radiation, obesity who presented with four day history of shortness of breath. Patient also noted three day history of chest pain unrelieved by nitroglycerin with associated left arm pain in setting of exertion with associated diaphoresis. Pain subsided with rest. Patient also noted to have lower extremity edema, abdominal bloating and decreased urine output despite medication compliance.   Patient initially found to be significantly hypoxic by EMS requiring NRB en route with improvement of O2 sats to low 90s. Patient improved with breathing treatments with EMS. She continued to have difficulty breathing despite receiving magnesium and solumedrol in the ED. Patient started on BiPAP. CXR with possible fluid overload with elevated BNP and troponins. Patient given small dose of lasix and aspirin. CT chest with bilateral infiltrates concerning for possible PNA, however negative for PE. In the ED, patient became gradually more obtunded and unresponsive on BiPAP requiring intubation. During intubation, patient had brief bradycardic PEA arrest lasting <5 minutes with brief CPR and 1 dose epinephrine with ROSC.  Patient admitted to [facility] for cardiac arrest.   CXR today shows increasing regions of atelectasis versus consolidation. Bilateral pleural effusions present, however only mildly worsened from yesterday. Unasyn added for concern for aspiration pneumonia and was quickly transition to cefepime due to sputum culture growing Enterobacter. Patient had some acute blood lose  Concerned for acute GI bleed. GI consulted and EGD showed erosive gastropathy with remnants of bleeding. NG tube place.   Patient was extubated and pressers weaned off and transitioned to the floor yesterday (04/03)  On 04/04 she began to have nausea and at least 2 episodes of emesis. CXR showed worsening right sided consolidation concerning aspiration vs CHF. She had new oxygen requirement and respiratory distress. Patient was transferred back to the ICU  SIGNIFICANT PAST MEDICAL HISTORY   HFrEF HTN Breast cancer s/p radiation  Obesity   SIGNIFICANT EVENTS:  3/30 - admit to ICU s/p cardiac arrest 4/3 - extubated  4/3 - central line removed. 4/4 - admitted to ICU and BiPAP started today  STUDIES:   3/30 CTA Chest PE >> No PE, extensive bilateral pulmonary infiltrates w/bilateral pleural effusions R>L, tracheomalacia, postoperative seroma  3/30 Echo >> LVEF 25-30%, global hypokinesis, elevated LVEDP, severe akinesis of LV, inferior, inferoseptal and inferolateral wall; moderately elevated pulmonary artery systolic pressure; mild to moderate elevated MV regurgitation. CULTURES:  3/30 SARS COVID-19, Influenza A/B >> negative  Blood cultures (3/31): NGTD 3/31 Sputum culture - enterobacter  ANTIBIOTICS:  Vancomycin x1 3/30 Zosyn x1 3/30 Unasyn 4/1-4/3 Cefepime 4/3 >>  LINES/TUBES:  Peripheral line left and right AC  CONSULTANTS:    SUBJECTIVE:  Patient transferred back to the MICU today  CONSTITUTIONAL:  BP 108/79   Pulse (!) 122   Temp 97.6 F (36.4 C) (Oral)   Resp (!) 33   Ht 5\' 1"  (1.549 m)   Wt 92.5 kg    LMP 07/12/1984   SpO2 94%   BMI 38.55 kg/m   I/O last 3 completed shifts: In: 1886.1 [P.O.:480; I.V.:747.7; JL:[id number]; IV Piggyback:433.3] Out: 1075 [Urine:1075]     Vent Mode: Stand-by FiO2 (%):  [40 %] 40 % PEEP:  [5 cmH20] 5 cmH20 Pressure Support:  [5 cmH20] 5 cmH20  Not on ventilator, starting BIPAP today  PHYSICAL EXAM: Physical Exam  Constitutional: She is oriented to person, place, and time. She appears distressed.  HENT:  Head: Normocephalic and atraumatic.  Eyes: EOM are normal.  Cardiovascular: Intact distal pulses. Tachycardia present.  Pulmonary/Chest: She is in respiratory distress. She has wheezes (upper lung fields bilaterally). She has rales (diffuse right lung fields).  Abdominal: She exhibits no distension. There is no abdominal tenderness.  Musculoskeletal:        General: Edema (trace to 1+ pitting edema) present. Normal range of motion.     Cervical back: Normal range of motion.  Neurological: She is alert and oriented to person, place, and time.  Skin: She is diaphoretic.  Cool and clamy extremities     RESOLVED PROBLEM LIST    ASSESSMENT AND PLAN   Acute hypoxic and hypercarbic respiratory failure Multi-factorial: Cardiogenic pulmonary edema + CAP   Patient developed worsening respiratory status with tachypnea and hypoxia in the setting of poor cardiac functioning and possible aspiration. Recenty taken off positive pressure ventilation with worsening right sided infiltrate on CXR concerning for cardiogenic etiology vs aspiration. I will increase her IV lasix today and monitor urine output. We will start BIPAP, but patient is nauseated with 1 episodes of emesis/sputum production overnight concerning for reintubation. She does have a leukocytosis concerning for aspiration pneumonia with sputum cultures growing Enterobacter. She is on day 6 of antibiotic therapy and deescalated to Cefepime yesterday due to sensitivities from sputum culture.  I will talk  to the patient's daughters today about goals of care regarding reintubation.  - BIPAP today - Increase lasix to 80 BID monitor BP - Repeat lactic acid and ABG - Continue Cefepime for possible aspiration pneumonia.   Cardiogenic shock in setting or worsening HFrEF NSTEMI, Cardiac arrest  Hx three-vessel CAD Patient presented with worsening HFrEF, noted to have severely reduced EF of 25-30% (previously 45-50% in September 2020).  She has had PCI to LCx but unable to be stented, likely culprit vessel (subacute). Patient transition to floor yesterday and was taken off of positive pressure, likely developed worsening atelectasis in the setting of poor cardiac function. She is intermittently hypotensive.  - Restart IV furosemide at 80 mg BID, carefully watch blood pressure. - She may need pressers started in the near future. - Monitor strict ins and outs  Acute kidney injury: Patient's baseline Cr 0.9-1.0. Continues to have an elevated CR at 1.72 today with decreased urine output. - Monitor renal function with daily BMPs and urine output.  Acute blood loss anemia Upper GI bleed Gastroenterology consulted yesterday. EGD performed and showed erosive gastropathy with erosions near G tube; tube removed. Patient's Hgb is stable today - Continue protonix 40 mg BID  -Monitor CBC  Diabetes mellitus:  A1c 8.6%. Home medications include Metformin. - SSI   Hx Hypertension: Continue to hold antihypertensive medications.   SUMMARY OF TODAY'S PLAN:  See above  Best Practice / Goals of  Care / Disposition.   DVT PROPHYLAXIS:SCDs NUTRITION:NPO MOBILITY:bedrest GOALS OF CARE:full FAMILY DISCUSSIONS: spoke with patient's daughters about changing status to DNI DISPOSITION pending  LABS  Glucose Recent Labs  Lab 01/05/20 1629 01/05/20 2132 01/06/20 0015 01/06/20 0401 01/06/20 0621 01/06/20 0745  GLUCAP 175* 119* 174* 115* 139* 103*    BMET Recent Labs  Lab 01/13/2020 0445  01/05/20 0442 01/06/20 0504  NA 141 146* 143  K 3.7 3.8 3.5  CL 103 107 105  CO2 28 27 18*  BUN 35* 41* 43*  CREATININE 1.22* 1.21* 1.72*  GLUCOSE 238* 264* 197*    Liver Enzymes Recent Labs  Lab 12/12/2019 0753 01/02/20 0446 01/17/2020 0445  AST 43* 212* 45*  ALT 26 166* 97*  ALKPHOS 68 61 58  BILITOT 0.3 0.2* 0.3  ALBUMIN 3.5 3.0* 2.9*    Electrolytes Recent Labs  Lab 01/26/2020 0017 01/22/2020 0017 01/12/2020 0445 01/05/20 0442 01/06/20 0504  CALCIUM 8.8*   < > 8.9 8.8* 8.7*  MG 2.1  --  2.2 2.3  --   PHOS  --   --  2.9  --   --    < > = values in this interval not displayed.    CBC Recent Labs  Lab 01/07/2020 1400 01/05/20 0442 01/06/20 0812  WBC 9.5 8.2 PENDING  HGB 8.9* 8.5* 10.6*  HCT 28.6* 27.2* 36.0  PLT 150 133* 145*    ABG Recent Labs  Lab 12/16/2019 1652 01/02/20 0119 01/06/20 0730  PHART 7.045* 7.320* 7.381  PCO2ART 52.0* 34.6 23.9*  PO2ART 57.0* 308.0* 58.1*    Coag's No results for input(s): APTT, INR in the last 168 hours.  Sepsis Markers Recent Labs  Lab 12/25/2019 1928 12/27/2019 2201 01/03/20 0350  LATICACIDVEN 7.9* 5.2* 1.6    Cardiac Enzymes No results for input(s): TROPONINI, PROBNP in the last 168 hours.  PAST MEDICAL HISTORY :   She  has a past medical history of Arthritis, Breast cancer ([place]) (2006), CAD (coronary artery disease) (11/07), Cataract, Chronic lower back pain, Chronic pain, Chronic venous insufficiency (2012), Colonic polyp (1995), Diabetes mellitus type II, Diastolic CHF, chronic ([place]), Essential hypertension, GERD (gastroesophageal reflux disease), Heart murmur, History of radiation therapy (10/02/18- 11/09/18), Hyperlipidemia, Iron deficiency anemia, Obesity, OSA (obstructive sleep apnea) (02/16/2007), OSA on CPAP, Personal history of chemotherapy, Personal history of radiation therapy, Refusal of blood transfusions as patient is Jehovah's Witness, and Seasonal allergies.  PAST SURGICAL HISTORY:  She  has a past  surgical history that includes Breast lumpectomy w/ needle localization (2006); Tonsillectomy and adenoidectomy (1964); Breast lumpectomy (Left, 2006); Total abdominal hysterectomy (1985); Colonoscopy w/ biopsies and polypectomy (1994, 2000, 2012, 26/018); Cardiac catheterization (N/A, 08/15/2015); LEFT HEART CATH AND CORONARY ANGIOGRAPHY (N/A, 08/19/2017); Ultrasound guidance for vascular access (08/19/2017); Portacath placement (N/A, 04/14/2018); IR IMAGING GUIDED PORT INSERTION (04/17/2018); Mastectomy modified radical (Bilateral, 07/20/2018); Mastectomy modified radical (Bilateral, 07/20/2018); IR REMOVAL TUN ACCESS W/ PORT W/O FL MOD SED (11/14/2018); LEFT HEART CATH AND CORONARY ANGIOGRAPHY (N/A, 06/13/2019); and CORONARY STENT INTERVENTION (N/A, 06/13/2019).  Allergies  Allergen Reactions  . Blood-Group Specific Substance Other (See Comments)    NO BLOOD - JEHOVAH'S WITNESS  . Lisinopril Other (See Comments)    Chronic cough secondary to ACE  . Oxycodone-Acetaminophen Hives, Itching and Other (See Comments)    flushing  . Percocet [Oxycodone-Acetaminophen] Hives  . Victoza [Liraglutide] Other (See Comments)    Made her feel "out of it"  . Atorvastatin Other (See Comments)    myalgias  No current facility-administered medications on file prior to encounter.   Current Outpatient Medications on File Prior to Encounter  Medication Sig  . amLODipine (NORVASC) 10 MG tablet TAKE ONE (1) TABLET BY MOUTH EVERY DAY (Patient taking differently: Take 10 mg by mouth daily. )  . aspirin EC 81 MG tablet Take 81 mg by mouth daily.  Marland Kitchen BRILINTA 90 MG TABS tablet Take 1 tablet by mouth 2 (two) times daily.  . Coenzyme Q10 (CO Q 10) 100 MG CAPS Take 100 mg by mouth daily.   . furosemide (LASIX) 20 MG tablet TAKE 1 TABLET (20 MG TOTAL) BY MOUTH AS NEEDED FOR EDEMA. (Patient taking differently: Take 20 mg by mouth daily as needed for edema. )  . gabapentin (NEURONTIN) 300 MG capsule Take 1 capsule (300 mg  total) by mouth 3 (three) times daily. (Patient taking differently: Take 300 mg by mouth 3 (three) times daily as needed. )  . guaiFENesin (ROBITUSSIN) 100 MG/5ML SOLN Take 10 mLs by mouth every 4 (four) hours as needed for cough or to loosen phlegm. Uses sugar free  . insulin aspart (NOVOLOG FLEXPEN) 100 UNIT/ML FlexPen Please take 8U in the morning before breakfast and take 10U at lunch and with dinner.  . isosorbide mononitrate (IMDUR) 120 MG 24 hr tablet Take 1 tablet (120 mg total) by mouth 2 (two) times daily.  Marland Kitchen letrozole (FEMARA) 2.5 MG tablet Take 0.5 tablets (1.25 mg total) by mouth daily.  Marland Kitchen losartan (COZAAR) 50 MG tablet Take 1 tablet (50 mg total) by mouth daily.  . metFORMIN (GLUCOPHAGE) 1000 MG tablet TAKE 1 TABLET (1,000 MG TOTAL) BY MOUTH 2 (TWO) TIMES DAILY WITH A MEAL.  . metoprolol succinate (TOPROL-XL) 100 MG 24 hr tablet TAKE ONE (1) TABLET BY MOUTH EVERY DAY (Patient taking differently: Take 100 mg by mouth daily. )  . Multiple Vitamins-Minerals (CENTRUM SILVER PO) Take 1 tablet by mouth daily.  . nitroGLYCERIN (NITROSTAT) 0.4 MG SL tablet PLACE ONE TABLET UNDER THE TONGUE EVERY 5 MINUTES AS NEEDED FOR CHEST PAIN (Patient taking differently: Place 0.4 mg under the tongue every 5 (five) minutes as needed for chest pain. )  . Olopatadine HCl (PATADAY) 0.2 % SOLN Place 1 drop into both eyes daily as needed (for allergies).   Marland Kitchen omeprazole (PRILOSEC) 40 MG capsule Take 1 capsule (40 mg total) by mouth daily. (Patient taking differently: Take 40 mg by mouth daily as needed (for acid reflux). )  . pyridoxine (B-6) 100 MG tablet Take 100 mg by mouth daily.  . RESTASIS 0.05 % ophthalmic emulsion Place 1 drop into both eyes 2 (two) times daily as needed (dry eyes).   . rosuvastatin (CRESTOR) 10 MG tablet Take 1 tablet (10 mg total) by mouth daily.  Marland Kitchen spironolactone (ALDACTONE) 25 MG tablet Take 2 tablets (50 mg total) by mouth daily. (Patient taking differently: Take 25 mg by mouth daily.  )  . TOUJEO SOLOSTAR 300 UNIT/ML SOPN INJECT 35 UNITS INTO THE SKIN DAILY (Patient taking differently: Inject 35 Units into the skin daily. )  . vitamin B-12 (CYANOCOBALAMIN) 1000 MCG tablet Take 1,000 mcg by mouth daily.  Marland Kitchen ACCU-CHEK FASTCLIX LANCETS MISC Check blood sugar 3 times a day (Patient taking differently: 1 each by Other route 3 (three) times daily. )  . Continuous Blood Gluc Receiver (FREESTYLE LIBRE 2 READER) DEVI 1 each by Does not apply route 6 (six) times daily.  . Continuous Blood Gluc Sensor (FREESTYLE LIBRE 2 SENSOR) MISC 1 each  by Does not apply route 6 (six) times daily.  Marland Kitchen glucose blood (ACCU-CHEK GUIDE) test strip USE TO CHECK BLOOD SUGARS THREE TIMES DAILY  . Insulin Pen Needle 32G X 4 MM MISC Use to inject insulin 4 times daily under the skin. DIAG CODE E11.319. INSULIN DEPENDENT    FAMILY HISTORY:   Her family history includes Breast cancer in her sister; Depression (age of onset: 41) in her daughter; Diabetes in her father; Heart attack (age of onset: 64) in her brother; Kidney failure in her brother; Lung cancer in her mother; Osteoarthritis in her maternal grandmother; Pulmonary embolism in her brother. There is no history of Colon cancer, Colon polyps, Esophageal cancer, Rectal cancer, or Stomach cancer.  SOCIAL HISTORY:  She  reports that she has never smoked. She has never used smokeless tobacco. She reports that she does not drink alcohol or use drugs.  REVIEW OF SYSTEMS:    SHOB, wheezing, nausea, emesis

## 2020-01-06 NOTE — Progress Notes (Addendum)
   Patient Name: [name] Date of Encounter: 01/06/2020, 12:51 PM    Subjective  Vomited some today but no bleeding Extubated but back to ICU because of resp distress   Objective  BP 94/68   Pulse (!) 122   Temp 97.6 F (36.4 C) (Oral)   Resp (!) 23   Ht 5\' 1"  (1.549 m)   Wt 92.5 kg   LMP 07/12/1984   SpO2 100%   BMI 38.55 kg/m  Acutely and chronically ill  CBC Latest Ref Rng & Units 01/06/2020 01/06/2020 01/05/2020  WBC 4.0 - 10.5 K/uL - 15.7(H) 8.2  Hemoglobin 12.0 - 15.0 g/dL 9.5(L) 10.6(L) 8.5(L)  Hematocrit 36.0 - 46.0 % 28.0(L) 36.0 27.2(L)  Platelets 150 - 400 K/uL - 145(L) 133(L)      Assessment and Plan  Hematemesis, UGI bleed from erosive gastritis in part due to NGT suction Acute blood loss anemia Jehovah's witness  Improved Continue PPI and no anti-coagulants though I am not opposed to asa and other antiPLT like clopidogrel, ticagrelor in next couple of days if no further bleeding  Signing off  Gatha Mayer, MD, [facility] 01/06/2020 12:51 PM

## 2020-01-06 NOTE — Progress Notes (Signed)
Patient now much more alert and oriented now. Dr. Lamonte Sakai, Suezanne Jacquet (RN), and myself were at bedside for continued [place] discussions given that she now appears to be in cardiogenic shock. Dr. Lamonte Sakai explained the implications of the current situation and various strategies including invasive aggressive care. Daughter and patient are currently leaning towards none invasive care and avoiding central access and intubation if possible; however, they are not ready to make a definitive decision. We discussed keeping her on NRB and starting low dose peripheral norepinephrine. We will coordinate with cardiology to ensure we are providing optimal care. All questions and concerns addressed.   Ina Homes, MD  [facility] PGY3  Pager: [phone]

## 2020-01-06 NOTE — Progress Notes (Signed)
OT Cancellation Note  Patient Details Name: ZEPHA SALVINO MRN: [id number] DOB: 25-Jul-1951   Cancelled Treatment:    Reason Eval/Treat Not Completed: Patient not medically ready Pt with vomiting, significant event with respiratory status and transfer back to ICU. Will hold until pt more medically appropriate.   Zenovia Jarred, MSOT, OTR/L Acute Rehabilitation Services [facility] Office Number: [phone] Pager: [phone]  Zenovia Jarred 01/06/2020, 2:40 PM

## 2020-01-06 NOTE — Progress Notes (Signed)
Lengthy conversation with pt and family re PICC placement and surgical hx.  States has had Bil mastectomy with lymph nodes removed bil as well.  Dr Lamonte Sakai and Suezanne Jacquet RN aware and need to remove PIV bil as well.  CVC recommended to avoid peripheral access.

## 2020-01-06 NOTE — Significant Event (Signed)
Rapid Response Event Note  Overview:Called d/t HR-140s, increased WOB and decreasing SpO2.  Initial Focused Assessment: Pt laying in bed, alert and oriented, c/o SOB. Pt appears mildly anxious-keeps requesting that we call her dtr. Pt's is tachypneic and has mild WOB-gets winded easily when talking. Lungs diminished on L and wheezy on R. Skin cool to touch but pt c/o being hot. Pt has been coughing up thick secretions t/o night and has vomited once. T-98.3, HR-140s(ST), BP-103/80, RR-32, SpO2-88% on 3.5L [place]. FIO2 increased to 6L [place] with SpO2 increasing to 91-94%.  Interventions: PCCM MD to bedside to evaluate pt Give 6AM dose of metoprolol 5mg  IV now-HR down to 115 PCXR Lasix 40mg  IV x 1 Plan of Care (if not transferred): Give lasix and monitor response. Await PCXR results and relay abnormalities to MD. Continue to monitor pt. Call RRT if further assistance needed.  Event Summary: PCCM Elink MD notified: 24 Called: [phone] Arrived: 0406 Ended: Dillard [place]

## 2020-01-06 NOTE — Progress Notes (Signed)
PHARMACY NOTE:  ANTIMICROBIAL RENAL DOSAGE ADJUSTMENT  Current antimicrobial regimen includes a mismatch between antimicrobial dosage and estimated renal function.  As per policy approved by the Pharmacy & Therapeutics and Medical Executive Committees, the antimicrobial dosage will be adjusted accordingly.  Current antimicrobial dosage:  Cefepime 2g q12h  Indication: PNA  Renal Function:  Estimated Creatinine Clearance: 28.1 mL/min (A) (by C-G formula based on SCr of 1.99 mg/dL (H)). []      On intermittent HD, scheduled: []      On CRRT    Antimicrobial dosage has been changed to:  Cefepime 2g q24h  Arrie Senate, PharmD, BCPS Clinical Pharmacist [phone] Please check AMION for all [facility] numbers 01/06/2020

## 2020-01-06 NOTE — Progress Notes (Signed)
Internal Medicine Attending:   I saw and examined the patient. I reviewed the resident's note and I agree with the resident's findings and plan as documented in the resident's note.  Patient was initially admitted to the hospital with cardiogenic shock secondary to NSTEMI requiring intubation was hospital course was complicated by a GI bleed and aspiration pneumonia.  Patient also initially required pressors.  Patient was weaned off pressors and yesterday she was extubated and we resumed service this a.m.  I saw this patient with Dr. Gilberto Better.  Patient was noted to be in respiratory distress (tachypneic, tachycardic and diaphoretic).  She was also noted to have cold and clammy extremities and decreased urine output (has had only minimal urine output over the last 12 hours.  Patient with likely cardiogenic shock from low output heart failure in setting of a history of recent NSTEMI.  Repeat chest x-ray done showed worsening right lower lobe infiltrates.  Stat ABG was done which showed a pH of 7.38, PCO2 of 23.9 and PO2 58.1 with a bicarb of 14 consistent with metabolic acidosis with respiratory alkalosis.  Repeat lactic acid was greater than 11 and patient's creatinine has worsened to 1.72.  Patient likely with multiorgan failure secondary to cardiogenic shock.  We will transfer the patient back to the ICU today.  She remains a full code for now.  Cardiology follow-up and recommendations appreciated.  Patient is now on milrinone and norepinephrine drips.  We will continue diuresis with IV Lasix.  Continue with cefepime for right lower lobe pneumonia.  Resident discussed case with PCCM.  We also discussed plan of care with daughter at bedside.  Ongoing goals of care discussion is being had with daughter and family.  We will resume service once patient is transferred back to the floor.

## 2020-01-06 NOTE — Progress Notes (Addendum)
Patient has no output 1 hr after IV lasix given. Bladder scan performed showed 149. Will continue to monitor and perform In and out catherization per protocol if unable to void. Patient remains on 6L [place] SPO2 94%. Work of breathing has improved. Will continue to monitor patient.

## 2020-01-07 ENCOUNTER — Inpatient Hospital Stay (HOSPITAL_COMMUNITY): Payer: Medicare Other

## 2020-01-07 ENCOUNTER — Other Ambulatory Visit: Payer: Self-pay | Admitting: Internal Medicine

## 2020-01-07 DIAGNOSIS — I509 Heart failure, unspecified: Secondary | ICD-10-CM

## 2020-01-07 DIAGNOSIS — R600 Localized edema: Secondary | ICD-10-CM

## 2020-01-07 DIAGNOSIS — R000 Tachycardia, unspecified: Secondary | ICD-10-CM

## 2020-01-07 DIAGNOSIS — E11319 Type 2 diabetes mellitus with unspecified diabetic retinopathy without macular edema: Secondary | ICD-10-CM

## 2020-01-07 DIAGNOSIS — E1165 Type 2 diabetes mellitus with hyperglycemia: Secondary | ICD-10-CM

## 2020-01-07 DIAGNOSIS — I2510 Atherosclerotic heart disease of native coronary artery without angina pectoris: Secondary | ICD-10-CM

## 2020-01-07 LAB — GLUCOSE, CAPILLARY
Glucose-Capillary: 169 mg/dL — ABNORMAL HIGH (ref 70–99)
Glucose-Capillary: 197 mg/dL — ABNORMAL HIGH (ref 70–99)
Glucose-Capillary: 200 mg/dL — ABNORMAL HIGH (ref 70–99)
Glucose-Capillary: 206 mg/dL — ABNORMAL HIGH (ref 70–99)
Glucose-Capillary: 216 mg/dL — ABNORMAL HIGH (ref 70–99)
Glucose-Capillary: 219 mg/dL — ABNORMAL HIGH (ref 70–99)
Glucose-Capillary: 237 mg/dL — ABNORMAL HIGH (ref 70–99)
Glucose-Capillary: 241 mg/dL — ABNORMAL HIGH (ref 70–99)

## 2020-01-07 LAB — BASIC METABOLIC PANEL
Anion gap: 18 — ABNORMAL HIGH (ref 5–15)
BUN: 53 mg/dL — ABNORMAL HIGH (ref 8–23)
CO2: 21 mmol/L — ABNORMAL LOW (ref 22–32)
Calcium: 8.9 mg/dL (ref 8.9–10.3)
Chloride: 106 mmol/L (ref 98–111)
Creatinine, Ser: 1.8 mg/dL — ABNORMAL HIGH (ref 0.44–1.00)
GFR calc Af Amer: 33 mL/min — ABNORMAL LOW (ref >60)
GFR calc non Af Amer: 28 mL/min — ABNORMAL LOW (ref >60)
Glucose, Bld: 211 mg/dL — ABNORMAL HIGH (ref 70–99)
Potassium: 3.7 mmol/L (ref 3.5–5.1)
Sodium: 145 mmol/L (ref 135–145)

## 2020-01-07 LAB — CULTURE, BLOOD (ROUTINE X 2): Culture: NO GROWTH

## 2020-01-07 LAB — MAGNESIUM: Magnesium: 2.5 mg/dL — ABNORMAL HIGH (ref 1.7–2.4)

## 2020-01-07 MED ORDER — FENTANYL CITRATE (PF) 100 MCG/2ML IJ SOLN
25.0000 ug | Freq: Once | INTRAMUSCULAR | Status: AC
  Administered 2020-01-07: 25 ug via INTRAVENOUS
  Filled 2020-01-07: qty 2

## 2020-01-07 MED ORDER — PNEUMOCOCCAL VAC POLYVALENT 25 MCG/0.5ML IJ INJ
0.5000 mL | INJECTION | INTRAMUSCULAR | Status: DC
  Filled 2020-01-07: qty 0.5

## 2020-01-07 MED ORDER — SENNA 8.6 MG PO TABS
1.0000 | ORAL_TABLET | Freq: Two times a day (BID) | ORAL | Status: DC
  Administered 2020-01-07: 21:00:00 8.6 mg via ORAL
  Filled 2020-01-07: qty 1

## 2020-01-07 MED ORDER — LIDOCAINE HCL (PF) 1 % IJ SOLN
INTRAMUSCULAR | Status: AC
  Filled 2020-01-07: qty 5

## 2020-01-07 MED ORDER — DIPHENHYDRAMINE HCL 25 MG PO CAPS
25.0000 mg | ORAL_CAPSULE | Freq: Once | ORAL | Status: AC | PRN
  Administered 2020-01-07: 25 mg via ORAL
  Filled 2020-01-07: qty 1

## 2020-01-07 MED ORDER — POLYETHYLENE GLYCOL 3350 17 G PO PACK
17.0000 g | PACK | Freq: Two times a day (BID) | ORAL | Status: DC
  Administered 2020-01-07: 17 g via ORAL
  Filled 2020-01-07: qty 1

## 2020-01-07 MED ORDER — DIPHENHYDRAMINE HCL 25 MG PO CAPS
25.0000 mg | ORAL_CAPSULE | Freq: Every evening | ORAL | Status: DC | PRN
  Administered 2020-01-07: 25 mg via ORAL
  Filled 2020-01-07: qty 1

## 2020-01-07 NOTE — Progress Notes (Signed)
Inpatient Diabetes Program Recommendations  AACE/ADA: New Consensus Statement on Inpatient Glycemic Control (2015)  Target Ranges:  Prepandial:   less than 140 mg/dL      Peak postprandial:   less than 180 mg/dL (1-2 hours)      Critically ill patients:  140 - 180 mg/dL   Lab Results  Component Value Date   GLUCAP 200 (H) 01/07/2020   HGBA1C 8.6 (H) 12/09/2019    Review of Glycemic Control Results for TIMAYA, GOSA (MRN [id number]) as of 01/07/2020 11:17  Ref. Range 01/07/2020 00:27 01/07/2020 04:23 01/07/2020 07:55  Glucose-Capillary Latest Ref Range: 70 - 99 mg/dL 197 (H) 169 (H) 200 (H)   Diabetes history:Type 2 DM Outpatient Diabetes medications:Novolog 8 units QAM/ 10 units QPM, Metformin 1000 mg BID, Toujeo 35 units QD Current orders for Inpatient glycemic control:Novolog 0-15 units Q4H  Inpatient Diabetes Program Recommendations:  Consider adding back Levemir 10 units BID.   Thanks, Bronson Curb, MSN, RNC-OB Diabetes Coordinator [phone] (8a-5p)

## 2020-01-07 NOTE — Telephone Encounter (Signed)
In the ICU

## 2020-01-07 NOTE — Progress Notes (Addendum)
Progress Note  Patient Name: [name] Date of Encounter: 01/07/2020  Primary Cardiologist: Minus Breeding, MD   Subjective   Feeling better.  Denies any chest pain.  SOB currently but just finished with PT.  She put out 950cc yesterday and is net neg 3.8L.  Weight is down 6lbs from yesterday.  Tele shows tachycardia - cannot discern whether ST or aflutter.  Inpatient Medications    Scheduled Meds: . aspirin EC  81 mg Oral Daily  . Chlorhexidine Gluconate Cloth  6 each Topical Q0600  . fentaNYL (SUBLIMAZE) injection  25 mcg Intravenous Once  . furosemide  80 mg Intravenous BID  . insulin aspart  0-15 Units Subcutaneous Q4H  . pantoprazole (PROTONIX) IV  40 mg Intravenous Q12H  . rosuvastatin  10 mg Oral Daily  . sodium chloride flush  10-40 mL Intracatheter Q12H   Continuous Infusions: . sodium chloride Stopped (01/05/20 2311)  . sodium chloride 10 mL/hr at 01/07/20 0800  . ceFEPime (MAXIPIME) IV    . milrinone 0.25 mcg/kg/min (01/07/20 0800)  . norepinephrine (LEVOPHED) Adult infusion Stopped (01/07/20 0215)   PRN Meds: [CANCELED] Place/Maintain arterial line **AND** sodium chloride, acetaminophen, fentaNYL, fentaNYL (SUBLIMAZE) injection, lip balm, promethazine, sennosides, sodium chloride flush   Vital Signs    Vitals:   01/07/20 0730 01/07/20 0800 01/07/20 0830 01/07/20 0900  BP: 112/66 97/67 104/67 107/67  Pulse:  (!) 134 (!) 137 (!) 132  Resp: (!) 25 (!) 29 (!) 24 (!) 27  Temp:  98.4 F (36.9 C)    TempSrc:  Oral    SpO2:  96% 94% 100%  Weight:      Height:        Intake/Output Summary (Last 24 hours) at 01/07/2020 [id number] Last data filed at 01/07/2020 0800 Gross per 24 hour  Intake 1159.95 ml  Output 950 ml  Net 209.95 ml   Last 3 Weights 01/07/2020 01/06/2020 01/05/2020  Weight (lbs) 198 lb 10.2 oz 204 lb 207 lb 14.3 oz  Weight (kg) 90.1 kg 92.534 kg 94.3 kg      Telemetry    Tachycardia at 130bpm but just finished PT - cannot discern from tele whether  ST or aflutter- Personally Reviewed   Physical Exam   GEN: Well nourished, well developed with some DOE after finishing PT HEENT: Normal NECK: No JVD; No carotid bruits LYMPHATICS: No lymphadenopathy CARDIAC:tachy, no murmurs, rubs, gallops RESPIRATORY:  Clear to auscultation anteriorly ABDOMEN: Soft, non-tender, non-distended MUSCULOSKELETAL:  No edema; No deformity  SKIN: Warm and dry NEUROLOGIC:  Alert and oriented x 3 PSYCHIATRIC:  Normal affect    Labs    High Sensitivity Troponin:   Recent Labs  Lab 12/21/2019 1008 12/26/2019 1922 12/29/2019 2103 01/06/20 1205 01/06/20 1449  TROPONINIHS 3,427* 3,446* 3,906* 11,191* 9,321*      Chemistry Recent Labs  Lab 01/02/20 0446 01/03/20 0350 01/10/2020 0445 01/05/20 0442 01/06/20 0504 01/06/20 0504 01/06/20 1205 01/06/20 1227 01/07/20 0435  NA 137   < > 141   < > 143   < > 144 140 145  K 3.6   < > 3.7   < > 3.5   < > 4.2 4.1 3.7  CL 105   < > 103   < > 105  --  105  --  106  CO2 18*   < > 28   < > 18*  --  16*  --  21*  GLUCOSE 264*   < > 238*   < >  197*  --  206*  --  211*  BUN 40*   < > 35*   < > 43*  --  54*  --  53*  CREATININE 1.83*   < > 1.22*   < > 1.72*  --  1.99*  --  1.80*  CALCIUM 8.1*   < > 8.9   < > 8.7*  --  8.7*  --  8.9  PROT 6.8  --  6.5  --   --   --  6.7  --   --   ALBUMIN 3.0*  --  2.9*  --   --   --  3.0*  --   --   AST 212*  --  45*  --   --   --  804*  --   --   ALT 166*  --  97*  --   --   --  609*  --   --   ALKPHOS 61  --  58  --   --   --  67  --   --   BILITOT 0.2*  --  0.3  --   --   --  1.1  --   --   GFRNONAA 28*   < > 45*   < > 30*  --  25*  --  28*  GFRAA 32*   < > 53*   < > 35*  --  29*  --  33*  ANIONGAP 14   < > 10   < > 20*  --  23*  --  18*   < > = values in this interval not displayed.     Hematology Recent Labs  Lab 01/26/2020 1400 01/05/2020 1400 01/05/20 0442 01/06/20 0812 01/06/20 1227  WBC 9.5  --  8.2 15.7*  --   RBC 3.04*  --  2.86* 3.61*  --   HGB 8.9*   < > 8.5*  10.6* 9.5*  HCT 28.6*   < > 27.2* 36.0 28.0*  MCV 94.1  --  95.1 99.7  --   MCH 29.3  --  29.7 29.4  --   MCHC 31.1  --  31.3 29.4*  --   RDW 15.1  --  15.1 15.8*  --   PLT 150  --  133* 145*  --    < > = values in this interval not displayed.    BNP Recent Labs  Lab 01/02/2020 0753 01/10/2020 0446  BNP 1,562.2* 2,155.8*     Radiology    DG CHEST PORT 1 VIEW  Result Date: 01/06/2020 CLINICAL DATA:  Hypoxia EXAM: PORTABLE CHEST 1 VIEW COMPARISON:  01/05/2020 FINDINGS: Mild cardiomegaly. Increased right lower lung opacities. No pneumothorax or sizable pleural effusion. Endotracheal tube has been removed. Left IJ central venous catheter has also been removed. IMPRESSION: Increased right lower lung opacities. Electronically Signed   By: Ulyses Jarred M.D.   On: 01/06/2020 05:23   [place] EKG SITE RITE  Result Date: 01/06/2020 If Site Rite image not attached, placement could not be confirmed due to current cardiac rhythm.   Patient Profile     69 y.o. female with past medical history of coronary artery disease, hypertension, hyperlipidemia admitted with non-ST elevation myocardial infarction and acute on chronic combined systolic/diastolic congestive heart failure.  Intubated by critical care medicine.  Suffered a brief PEA arrest in the emergency room.  Patient has known three-vessel coronary artery disease and has been felt not to be a surgical  candidate given her refusal to take blood products as she is a Restaurant manager, fast food.  Patient underwent cardiac catheterization September 2020 and was found to have severe three-vessel coronary artery disease.  She had a drug-eluting stent to her proximal LAD.  There was severe stenosis in the first obtuse marginal with moderately severe disease in the circumflex.  She had successful PTCA of the obtuse marginal as a stent could not be placed.   Assessment & Plan    1.  Acute on chronic combined systolic/diastolic congestive heart failure/cardiogenic  shock- -Patient developed recurrent pulmonary edema and dyspnea after extubation.   -she put out 950cc yesterday and is net neg 3.8L and weight down 6lbs from yesterday -continue Lasix 80 mg IV twice daily.   -She was placed on norepinephrine for hypotension yesterday and BP soft at 104/77mmHg today and is off pressors.  -started on IV Milrinone yesterday -she is very tachycardic in the 130's today that was present before PT worked with her and has persisted. She is not febrile and is anemic but Hbg is 9.5 and should not account for that much tachycardia.  Need to consider persistent cardiogenic shock despite Milrinone -the patient cannot have PICC line due to prior double mastectomy so will ask CCM to plae right IJ catheter to obtain CO-ox readings and CVP.  2. Non-ST elevation myocardial infarction -bumped hsTrop back up yesterday from 3906 to 11,191 yesterday and trending downward to 9,321 -continue aspirin and statin.   -No heparin in the setting of GI bleed.   -she is tachycardic but cannot use BB in setting of soft BP -She is not presently a candidate for cardiac catheterization.  As outlined in previous notes her circumflex could not be stented previously.  She also had recent GI bleeding and therefore anticoagulation could not be used. -Her electrocardiogram does not show ST elevation.   -We could revisit this in the future if she improves to evaluate patency of previously placed LAD stent.    3.  Pneumonia -possible aspiration at time of arrest.   -Antibiotics per critical care medicine.  4.  AKI -follow renal function closely with diuresis.  5.  Recent GI bleed -continue Protonix and follow hemoglobin.  6.  Tachycardia -appears to be sinus in origin on EKG today -? Etiology -check TSH -CCM to place RIJ line to get CVP and CO-Ox. -BP soft for BB at this time    The patient is critically ill with multiple organ systems failure and requires high complexity decision making  for assessment and support, frequent evaluation and titration of therapies, application of advanced monitoring technologies and extensive interpretation of multiple databases. Critical Care Time devoted to patient care services described in this note independent of APP time is 60 minutes with >50% of time spent in direct patient care.    For questions or updates, please contact [facility] Please consult [email or web address] for contact info under        Signed, Fransico Him, MD  01/07/2020, 9:29 AM

## 2020-01-07 NOTE — Progress Notes (Addendum)
PULMONARY / CRITICAL CARE MEDICINE   NAME:  [name], MRN:  [id number], DOB:  [birth year], LOS: 6 ADMISSION DATE:  12/23/2019, CONSULTATION DATE:  01/06/2020 REFERRING MD:  Dr. Truman Hayward, CHIEF COMPLAINT:  Dyspnea  BRIEF HISTORY:    Patient is a 69 yr old female with PMHx of three vessel CAD, diabetes, HFpEF, HTN, breast cancer s/p radiation, obesity who presented with four days of dyspnea. Patient admitted with cardiogenic shock in setting of worsening CHF requiring intubation with brief PEA arrest with ROSC with brief CPR and 1 dose epinephrine. PCCM to admit.  HISTORY OF PRESENT ILLNESS   History obtained via chart review and family at bedside as patient is sedated and mechanically ventilated.   Patient is a 69yr old female with PMHx of three vessel CAD, HFpEF (EF 25-30%), HTN, breast cancer s/p radiation, obesity who presented with four day history of shortness of breath. Patient also noted three day history of chest pain unrelieved by nitroglycerin with associated left arm pain in setting of exertion with associated diaphoresis. Pain subsided with rest. Patient also noted to have lower extremity edema, abdominal bloating and decreased urine output despite medication compliance.   Patient initially found to be significantly hypoxic by EMS requiring NRB en route with improvement of O2 sats to low 90s. Patient improved with breathing treatments with EMS. She continued to have difficulty breathing despite receiving magnesium and solumedrol in the ED. Patient started on BiPAP. CXR with possible fluid overload with elevated BNP and troponins. Patient given small dose of lasix and aspirin. CT chest with bilateral infiltrates concerning for possible PNA, however negative for PE. In the ED, patient became gradually more obtunded and unresponsive on BiPAP requiring intubation. During intubation, patient had brief bradycardic PEA arrest lasting <5 minutes with brief CPR and 1 dose epinephrine with ROSC.  Patient admitted to [facility] for cardiac arrest.   CXR today shows increasing regions of atelectasis versus consolidation. Bilateral pleural effusions present, however only mildly worsened from yesterday. Unasyn added for concern for aspiration pneumonia and was quickly transition to cefepime due to sputum culture growing Enterobacter. Patient had some acute blood lose  Concerned for acute GI bleed. GI consulted and EGD showed erosive gastropathy with remnants of bleeding. NG tube place.   Patient was extubated and pressers weaned off and transitioned to the floor on 04/03.  On 04/04 she began to have nausea and at least 2 episodes of emesis. CXR showed worsening right sided consolidation concerning aspiration vs CHF. She had new oxygen requirement and respiratory distress. Patient was transferred back to the ICU  SIGNIFICANT PAST MEDICAL HISTORY   HFrEF HTN Breast cancer s/p radiation  Obesity   SIGNIFICANT EVENTS:  3/30 - admit to ICU s/p cardiac arrest 4/3 - extubated  4/3 - central line removed. 4/4 - re-admitted to ICU   STUDIES:   3/30 CTA Chest PE >> No PE, extensive bilateral pulmonary infiltrates w/bilateral pleural effusions R>L, tracheomalacia, postoperative seroma  3/30 Echo >> LVEF 25-30%, global hypokinesis, elevated LVEDP, severe akinesis of LV, inferior, inferoseptal and inferolateral wall; moderately elevated pulmonary artery systolic pressure; mild to moderate elevated MV regurgitation.  4/4 CXR >> Increased right lower lung opacities  CULTURES:  3/30 SARS COVID-19, Influenza A/B >> negative  3/31 Blood cultures >> NGTD at 5 days 3/31 Sputum culture >> Enterobacter aerogenes  ANTIBIOTICS:  Vancomycin x1 3/30 Zosyn x1 3/30 Unasyn 4/1-4/3 Cefepime 4/3 >>  LINES/TUBES:  Peripheral line left and right AC  CONSULTANTS:  Cardiology  Gastroenterology   SUBJECTIVE:  No overnight events, other than difficulty sleeping. Patient was able to be weaned off of Levophed  without difficulty.   She denies any CP at this time. She notes that its been difficult to talk and "get out her words" since being brought back to the ICU.   CONSTITUTIONAL: BP 110/69 (BP Location: Right Wrist)   Pulse (!) 127   Temp 98.7 F (37.1 C) (Oral)   Resp (!) 26   Ht 5\' 1"  (1.549 m)   Wt 90.1 kg   LMP 07/12/1984   SpO2 98%   BMI 37.53 kg/m   I/O last 3 completed shifts: In: 1366.2 [P.O.:480; I.V.:686.2; IV Piggyback:200.1] Out: 950 [Urine:950]  FiO2 (%):  [60 %-100 %] 60 %  PHYSICAL EXAM: Physical Exam Vitals and nursing note reviewed.  Constitutional:      General: She is not in acute distress.    Appearance: She is obese.  Cardiovascular:     Rate and Rhythm: Regular rhythm. Tachycardia present.     Heart sounds: No murmur.  Pulmonary:     Effort: Tachypnea and accessory muscle usage present. No respiratory distress.     Breath sounds: Examination of the right-lower field reveals rales. Examination of the left-lower field reveals rales. Rales (minimal fine crackles ) present. No decreased breath sounds or wheezing.  Abdominal:     Palpations: Abdomen is soft.     Tenderness: There is no abdominal tenderness. There is no guarding.  Musculoskeletal:     Right lower leg: No tenderness. Edema (trace) present.     Left lower leg: No tenderness. Edema (trace) present.  Skin:    General: Skin is warm and dry.  Neurological:     General: No focal deficit present.     Mental Status: She is alert.  Psychiatric:        Mood and Affect: Mood normal.        Behavior: Behavior normal.    RESOLVED PROBLEM LIST    ASSESSMENT AND PLAN    Acute hypoxic and hypercarbic respiratory failure Multi-factorial: Cardiogenic pulmonary edema + CAP   Ms. Hommes initially requiring BiPAP yesterday on re-admission to the ICU but has been able to be weaned to HF [place] this AM.   - Continue supplemental oxygen PRN to maintain sats > 92% - Wean as tolerated  Cardiogenic shock in  setting or worsening HFrEF NSTEMI, Cardiac arrest  Hx three-vessel CAD Patient presented with worsening HFrEF on admission, noted to have severely reduced EF of 25-30% (previously 45-50% in September 2020).  She has had PCI to LCx but unable to be stented. After being transferred to the floor yesterday, patient developed acute cardiogenic shock once again, likely in the setting of volume overload after diuresis was de-escalated. She has made significant improves today in her volume load. Will pursue a central line to allow daily tracking of CVP and co-ox panel.   - Cardiology following, we appreciate their recommendations  - Lasix 80 BID - IV Milrinone  - Monitor strict ins and outs - Daily weights - Co-ox panel daily  - CVP daily   Community Acquired Pneumonia Tracheal aspirate growing Enterbacter aerogenes. Unasyn was switched to Cefepime for better coverage.   - Cefepime 2 g QD   Tachycardia Likely sinus tachycardia, however difficult to assess at this time. Will monitor closely. Unable to treat with BB due to soft BP.   Acute kidney injury: Patient's baseline Cr 0.9-1.0. Creatinine elevation noted, likely secondary to  cardiogenic shock and diuresis. Improving today.   - Monitor renal function and urine output  Shock Liver Patient developed acute elevations in LFTs on 4/04 have developing cardiogenic shock again. Will trend LFTs to ensure improvement. No evidence of liver failure at this time.   - CMP  Acute blood loss anemia Upper GI bleed EGD showed erosive gastropathy with erosions near G tube; tube removed and bleed improved significantly. Patient's hemoglobin remains stable at this time.   - Continue protonix 40 mg BID  - Monitor CBC  Diabetes mellitus:  A1c 8.6%. Home medications include Metformin.  - SSI (Moderate)  Hx of Hypertension: Continue to hold antihypertensive medications.   Best Practice / Goals of Care / Disposition.   DVT PROPHYLAXIS: SCDs.  Heparin on hold due to upper GI bleed NUTRITION: Soft diet  GLYCEMIC: SSI (moderate) MOBILITY: PT/OT evaluation BOWEL: Miralax + Senna added today GOALS OF CARE: FULL FAMILY DISCUSSIONS: Updated family at bedside on 01/07/20 DISPOSITION ICU  LABS  Glucose Recent Labs  Lab 01/06/20 1709 01/06/20 2030 01/07/20 0027 01/07/20 0423 01/07/20 0755 01/07/20 1143  GLUCAP 175* 191* 197* 169* 200* 237*    BMET Recent Labs  Lab 01/06/20 0504 01/06/20 0504 01/06/20 1205 01/06/20 1227 01/07/20 0435  NA 143   < > 144 140 145  K 3.5   < > 4.2 4.1 3.7  CL 105  --  105  --  106  CO2 18*  --  16*  --  21*  BUN 43*  --  54*  --  53*  CREATININE 1.72*  --  1.99*  --  1.80*  GLUCOSE 197*  --  206*  --  211*   < > = values in this interval not displayed.    Liver Enzymes Recent Labs  Lab 01/02/20 0446 01/03/2020 0445 01/06/20 1205  AST 212* 45* 804*  ALT 166* 97* 609*  ALKPHOS 61 58 67  BILITOT 0.2* 0.3 1.1  ALBUMIN 3.0* 2.9* 3.0*    Electrolytes Recent Labs  Lab 01/16/2020 0445 01/05/2020 0445 01/05/20 0442 01/05/20 0442 01/06/20 0504 01/06/20 1205 01/07/20 0435  CALCIUM 8.9   < > 8.8*   < > 8.7* 8.7* 8.9  MG 2.2  --  2.3  --   --   --  2.5*  PHOS 2.9  --   --   --   --   --   --    < > = values in this interval not displayed.    CBC Recent Labs  Lab 01/07/2020 1400 01/25/2020 1400 01/05/20 0442 01/06/20 0812 01/06/20 1227  WBC 9.5  --  8.2 15.7*  --   HGB 8.9*   < > 8.5* 10.6* 9.5*  HCT 28.6*   < > 27.2* 36.0 28.0*  PLT 150  --  133* 145*  --    < > = values in this interval not displayed.    ABG Recent Labs  Lab 01/02/20 0119 01/06/20 0730 01/06/20 1227  PHART 7.320* 7.381 7.454*  PCO2ART 34.6 23.9* 25.3*  PO2ART 308.0* 58.1* 93.0    Coag's No results for input(s): APTT, INR in the last 168 hours.  Sepsis Markers Recent Labs  Lab 01/03/20 0350 01/06/20 0812 01/06/20 1224  LATICACIDVEN 1.6 >11.0* 5.0*    Cardiac Enzymes No results for  input(s): TROPONINI, PROBNP in the last 168 hours.  PAST MEDICAL HISTORY :   She  has a past medical history of Arthritis, Breast cancer ([place]) (2006), CAD (coronary artery disease) (  11/07), Cataract, Chronic lower back pain, Chronic pain, Chronic venous insufficiency (2012), Colonic polyp (1995), Diabetes mellitus type II, Diastolic CHF, chronic ([place]), Essential hypertension, GERD (gastroesophageal reflux disease), Heart murmur, History of radiation therapy (10/02/18- 11/09/18), Hyperlipidemia, Iron deficiency anemia, Obesity, OSA (obstructive sleep apnea) (02/16/2007), OSA on CPAP, Personal history of chemotherapy, Personal history of radiation therapy, Refusal of blood transfusions as patient is Jehovah's Witness, and Seasonal allergies.  PAST SURGICAL HISTORY:  She  has a past surgical history that includes Breast lumpectomy w/ needle localization (2006); Tonsillectomy and adenoidectomy (1964); Breast lumpectomy (Left, 2006); Total abdominal hysterectomy (1985); Colonoscopy w/ biopsies and polypectomy (1994, 2000, 2012, 26/018); Cardiac catheterization (N/A, 08/15/2015); LEFT HEART CATH AND CORONARY ANGIOGRAPHY (N/A, 08/19/2017); Ultrasound guidance for vascular access (08/19/2017); Portacath placement (N/A, 04/14/2018); IR IMAGING GUIDED PORT INSERTION (04/17/2018); Mastectomy modified radical (Bilateral, 07/20/2018); Mastectomy modified radical (Bilateral, 07/20/2018); IR REMOVAL TUN ACCESS W/ PORT W/O FL MOD SED (11/14/2018); LEFT HEART CATH AND CORONARY ANGIOGRAPHY (N/A, 06/13/2019); CORONARY STENT INTERVENTION (N/A, 06/13/2019); and Esophagogastroduodenoscopy (egd) with propofol (N/A, 01/16/2020).  Allergies  Allergen Reactions  . Blood-Group Specific Substance Other (See Comments)    NO BLOOD - JEHOVAH'S WITNESS  . Lisinopril Other (See Comments)    Chronic cough secondary to ACE  . Oxycodone-Acetaminophen Hives, Itching and Other (See Comments)    flushing  . Percocet [Oxycodone-Acetaminophen] Hives   . Victoza [Liraglutide] Other (See Comments)    Made her feel "out of it"  . Atorvastatin Other (See Comments)    myalgias    No current facility-administered medications on file prior to encounter.   Current Outpatient Medications on File Prior to Encounter  Medication Sig  . amLODipine (NORVASC) 10 MG tablet TAKE ONE (1) TABLET BY MOUTH EVERY DAY (Patient taking differently: Take 10 mg by mouth daily. )  . aspirin EC 81 MG tablet Take 81 mg by mouth daily.  Marland Kitchen BRILINTA 90 MG TABS tablet Take 1 tablet by mouth 2 (two) times daily.  . Coenzyme Q10 (CO Q 10) 100 MG CAPS Take 100 mg by mouth daily.   . furosemide (LASIX) 20 MG tablet TAKE 1 TABLET (20 MG TOTAL) BY MOUTH AS NEEDED FOR EDEMA. (Patient taking differently: Take 20 mg by mouth daily as needed for edema. )  . gabapentin (NEURONTIN) 300 MG capsule Take 1 capsule (300 mg total) by mouth 3 (three) times daily. (Patient taking differently: Take 300 mg by mouth 3 (three) times daily as needed. )  . guaiFENesin (ROBITUSSIN) 100 MG/5ML SOLN Take 10 mLs by mouth every 4 (four) hours as needed for cough or to loosen phlegm. Uses sugar free  . insulin aspart (NOVOLOG FLEXPEN) 100 UNIT/ML FlexPen Please take 8U in the morning before breakfast and take 10U at lunch and with dinner.  . isosorbide mononitrate (IMDUR) 120 MG 24 hr tablet Take 1 tablet (120 mg total) by mouth 2 (two) times daily.  Marland Kitchen letrozole (FEMARA) 2.5 MG tablet Take 0.5 tablets (1.25 mg total) by mouth daily.  Marland Kitchen losartan (COZAAR) 50 MG tablet Take 1 tablet (50 mg total) by mouth daily.  . metFORMIN (GLUCOPHAGE) 1000 MG tablet TAKE 1 TABLET (1,000 MG TOTAL) BY MOUTH 2 (TWO) TIMES DAILY WITH A MEAL.  . metoprolol succinate (TOPROL-XL) 100 MG 24 hr tablet TAKE ONE (1) TABLET BY MOUTH EVERY DAY (Patient taking differently: Take 100 mg by mouth daily. )  . Multiple Vitamins-Minerals (CENTRUM SILVER PO) Take 1 tablet by mouth daily.  . nitroGLYCERIN (NITROSTAT) 0.4 MG SL  tablet PLACE  ONE TABLET UNDER THE TONGUE EVERY 5 MINUTES AS NEEDED FOR CHEST PAIN (Patient taking differently: Place 0.4 mg under the tongue every 5 (five) minutes as needed for chest pain. )  . Olopatadine HCl (PATADAY) 0.2 % SOLN Place 1 drop into both eyes daily as needed (for allergies).   Marland Kitchen omeprazole (PRILOSEC) 40 MG capsule Take 1 capsule (40 mg total) by mouth daily. (Patient taking differently: Take 40 mg by mouth daily as needed (for acid reflux). )  . pyridoxine (B-6) 100 MG tablet Take 100 mg by mouth daily.  . RESTASIS 0.05 % ophthalmic emulsion Place 1 drop into both eyes 2 (two) times daily as needed (dry eyes).   . rosuvastatin (CRESTOR) 10 MG tablet Take 1 tablet (10 mg total) by mouth daily.  Marland Kitchen spironolactone (ALDACTONE) 25 MG tablet Take 2 tablets (50 mg total) by mouth daily. (Patient taking differently: Take 25 mg by mouth daily. )  . TOUJEO SOLOSTAR 300 UNIT/ML SOPN INJECT 35 UNITS INTO THE SKIN DAILY (Patient taking differently: Inject 35 Units into the skin daily. )  . vitamin B-12 (CYANOCOBALAMIN) 1000 MCG tablet Take 1,000 mcg by mouth daily.  Marland Kitchen ACCU-CHEK FASTCLIX LANCETS MISC Check blood sugar 3 times a day (Patient taking differently: 1 each by Other route 3 (three) times daily. )  . Continuous Blood Gluc Receiver (FREESTYLE LIBRE 2 READER) DEVI 1 each by Does not apply route 6 (six) times daily.  . Continuous Blood Gluc Sensor (FREESTYLE LIBRE 2 SENSOR) MISC 1 each by Does not apply route 6 (six) times daily.  Marland Kitchen glucose blood (ACCU-CHEK GUIDE) test strip USE TO CHECK BLOOD SUGARS THREE TIMES DAILY  . Insulin Pen Needle 32G X 4 MM MISC Use to inject insulin 4 times daily under the skin. DIAG CODE E11.319. INSULIN DEPENDENT    FAMILY HISTORY:   Her family history includes Breast cancer in her sister; Depression (age of onset: 82) in her daughter; Diabetes in her father; Heart attack (age of onset: 25) in her brother; Kidney failure in her brother; Lung cancer in her mother;  Osteoarthritis in her maternal grandmother; Pulmonary embolism in her brother. There is no history of Colon cancer, Colon polyps, Esophageal cancer, Rectal cancer, or Stomach cancer.  SOCIAL HISTORY:  She  reports that she has never smoked. She has never used smokeless tobacco. She reports that she does not drink alcohol or use drugs.   Dr. Jose Persia Internal Medicine PGY-1  Pager: [phone] 01/07/2020, 2:06 PM   Pulmonary critical care attending:  This is a 69 year old female past medical history of three-vessel coronary disease, diabetes heart failure with reduced ejection fraction, hypertension, breast cancer bilateral.  Patient was initially admitted for cardiogenic shock worsening congestive heart failure had a brief PEA cardiac arrest with CPR in epinephrine x1.  This was on admission.  Patient was brought back to the intensive care unit with progressive respiratory failure concern for pulmonary edema.  Also in cardiogenic shock requiring vasopressors and milrinone.  BP (!) 88/71   Pulse (!) 133   Temp 98.4 F (36.9 C) (Oral)   Resp (!) 27   Ht 5\' 1"  (1.549 m)   Wt 90.1 kg   LMP 07/12/1984   SpO2 96%   BMI 37.53 kg/m   General: Elderly female resting in bed, able to speak in few word sentences.  Some labored breathing. Heart: Regular tachycardic, S1-S2 Lungs: Diminished bilaterally bases, few crackles.  Labs: Reviewed Chest x-ray: Reviewed  Assessment: Acute  hypoxemic respiratory failure requiring nasal cannula O2 supplementation. History of OSA on CPAP nightly Cardiogenic shock, decompensated systolic heart failure History of NSTEMI, cardiac arrest History of three-vessel coronary disease Lactic acidosis  Plan: Patient remains on Lasix 80 mg IV twice daily Continue IV milrinone Patient has been successfully weaned off of norepinephrine Monitor I's and O's closely Central venous catheter placement plan for today for CVP and coox monitoring.  The patient's  family was updated at bedside.  This patient is critically ill with multiple organ system failure; which, requires frequent high complexity decision making, assessment, support, evaluation, and titration of therapies. This was completed through the application of advanced monitoring technologies and extensive interpretation of multiple databases. During this encounter critical care time was devoted to patient care services described in this note for 32 minutes.  Garner Nash, DO [facility] 01/07/2020 4:28 PM

## 2020-01-07 NOTE — Progress Notes (Signed)
  Speech Language Pathology Treatment: Dysphagia  Patient Details Name: [name] MRN: [id number] DOB: 10-12-1950 Today's Date: 01/07/2020 Time: ID:[id number] SLP Time Calculation (min) (ACUTE ONLY): 15 min  Assessment / Plan / Recommendation Clinical Impression  Pt with vomiting episode yesterday morning and developed respiratory distress > transferred back to ICU. She was seen for dysphagia treatment with family present and now on a full liquid diet. She reported frequent coughing and mucous expectoration since extubation. Pt coughing after head of bed raised that triggered gag response leading to mild-moderate emesis. After time to recover she consumed one straw sip diet gingerale without difficulty but declined additional stating it "didn't agree with her like she thought it would." Reviewed swallow precautions with pt and family and goal to upgrade when appropriate.    HPI HPI: Pt adm with acute systolic heart failure and cardiogenic shock. Pt with brief PEA arrest with CPR. Pt intubated 3/30-4/3. PMH - CAD, PTCA,  DM, HTN, diastolic chf, obesity, breast CA, back pain      SLP Plan  Continue with current plan of care       Recommendations  Diet recommendations: Thin liquid;Other(comment)(full liquids) Liquids provided via: Cup;Straw Medication Administration: Whole meds with puree Supervision: Patient able to self feed;Staff to assist with self feeding Compensations: Slow rate;Small sips/bites Postural Changes and/or Swallow Maneuvers: Seated upright 90 degrees                Oral Care Recommendations: Oral care BID Follow up Recommendations: (TBD) SLP Visit Diagnosis: Dysphagia, unspecified (R13.10) Plan: Continue with current plan of care                      Houston Siren 01/07/2020, 1:14 PM  Orbie Pyo Colvin Caroli.Ed Risk analyst [phone] Office [phone]

## 2020-01-07 NOTE — Progress Notes (Signed)
OT Cancellation    01/07/20 1600  OT Visit Information  Last OT Received On 01/07/20  Reason Eval/Treat Not Completed Patient not medically ready;Fatigue/lethargy limiting ability to participate (Will hold today and attempt later date)  Maurie Boettcher, [place] Pager [phone] Office [phone]

## 2020-01-07 NOTE — Progress Notes (Addendum)
PCCM INTERVAL PROGRESS NOTE  Central line attempt.   Procedure: Insertion of Central Venous Catheter Indications: Frequent blood sampling  Procedure Details Consent: Risks of procedure as well as the alternatives and risks of each were explained to the (patient/caregiver).  Consent for procedure obtained. Time Out: Verified patient identification, verified procedure, site/side was marked, verified correct patient position, special equipment/implants available, medications/allergies/relevent history reviewed, required imaging and test results available.  Performed  Maximum sterile technique was used including antiseptics, cap, gloves, gown, hand hygiene, mask and sheet. Skin prep: Chlorhexidine; local anesthetic administered  Right internal jugular vein was accessed under direct real-time ultrasound guidance. Guidewire was advanced without difficulty. Skin nicked with scalpel at the location of wire entry. Dilator was inserted through that incision without difficulty, at which point it then became impossible to dilate any further. The wire continued to move freely in the dilator, however, I met incredible resistance with the dilator and was unable to advance any further. I attempted to place the line without success. I did not feel comfortable applying any more pressure with the dilator and the attempt was aborted  CXR pending  Dr Valeta Harms notified.   Georgann Housekeeper, AGACNP-BC [place]  See Amion for personal pager PCCM on call pager [phone]  01/07/2020 5:04 PM

## 2020-01-07 NOTE — Progress Notes (Signed)
[facility] Progress Note Patient Name: [name] DOB: 1950-12-06 MRN: [id number]   Date of Service  01/07/2020  HPI/Events of Note  Requesting for sleep aid. Tolerated Benadryl last night  eICU Interventions  Ordered Benadryl 25 mg PO once     Intervention Category Minor Interventions: Routine modifications to care plan (e.g. PRN medications for pain, fever)  [name] [name] 01/07/2020, 1:41 AM

## 2020-01-07 NOTE — Progress Notes (Signed)
Physical Therapy Treatment Patient Details Name: [name] MRN: [id number] DOB: 1950/10/20 Today's Date: 01/07/2020    History of Present Illness Pt adm with acute systolic heart failure and cardiogenic shock. Pt with brief PEA arrest with CPR. Pt intubated 3/30-4/3. PMH - CAD, PTCA,  DM, HTN, diastolic chf, obesity, breast CA, back pain    PT Comments    Pt with very poor activity tolerance. Pt on heated HFNC 25L. Pt with HR 140's with sitting EOB. Initially had recommended consider CIR but doubt pt can tolerate that level of therapy. Likely will need SNF.   Follow Up Recommendations  SNF     Equipment Recommendations  Other (comment)(To be determined)    Recommendations for Other Services       Precautions / Restrictions Precautions Precautions: Fall Restrictions Weight Bearing Restrictions: No    Mobility  Bed Mobility Overal bed mobility: Needs Assistance Bed Mobility: Supine to Sit;Sit to Supine     Supine to sit: +2 for physical assistance;Mod assist;HOB elevated Sit to supine: +2 for physical assistance;Max assist   General bed mobility comments: Assist to bring legs off of bed, elevate trunk into sitting and bring hips to EOB. Assist to lower trunk and bring legs back up into bed   Transfers                 General transfer comment: Not attempted due to weakness, fatigue, and tenuous status.  Ambulation/Gait                 Stairs             Wheelchair Mobility    Modified Rankin (Stroke Patients Only)       Balance Overall balance assessment: Needs assistance Sitting-balance support: Bilateral upper extremity supported;Feet supported Sitting balance-Leahy Scale: Poor Sitting balance - Comments: Pt sat EOB x 5 minutes with mod assist Postural control: Posterior lean                                  Cognition Arousal/Alertness: Awake/alert Behavior During Therapy: Anxious Overall Cognitive Status:  Impaired/Different from baseline Area of Impairment: Attention;Memory;Following commands;Safety/judgement;Problem solving;Orientation                 Orientation Level: Disoriented to;Time Current Attention Level: Sustained Memory: Decreased recall of precautions;Decreased short-term memory Following Commands: Follows one step commands consistently;Follows one step commands with increased time Safety/Judgement: Decreased awareness of safety;Decreased awareness of deficits   Problem Solving: Slow processing;Decreased initiation;Difficulty sequencing;Requires verbal cues;Requires tactile cues        Exercises      General Comments        Pertinent Vitals/Pain Pain Assessment: Faces Faces Pain Scale: Hurts little more Pain Location: RLE Pain Descriptors / Indicators: Sore Pain Intervention(s): Limited activity within patient's tolerance;Repositioned    Home Living                      Prior Function            PT Goals (current goals can now be found in the care plan section) Acute Rehab PT Goals Patient Stated Goal: go home Progress towards PT goals: Not progressing toward goals - comment    Frequency    Min 3X/week      PT Plan Discharge plan needs to be updated    Co-evaluation  AM-PAC PT "6 Clicks" Mobility   Outcome Measure  Help needed turning from your back to your side while in a flat bed without using bedrails?: A Lot Help needed moving from lying on your back to sitting on the side of a flat bed without using bedrails?: Total Help needed moving to and from a bed to a chair (including a wheelchair)?: Total Help needed standing up from a chair using your arms (e.g., wheelchair or bedside chair)?: Total Help needed to walk in hospital room?: Total Help needed climbing 3-5 steps with a railing? : Total 6 Click Score: 7    End of Session Equipment Utilized During Treatment: Oxygen Activity Tolerance: Patient limited by  fatigue Patient left: in bed;with call bell/phone within reach;with chair alarm set;with family/visitor present Nurse Communication: Mobility status PT Visit Diagnosis: Unsteadiness on feet (R26.81);Other abnormalities of gait and mobility (R26.89);Muscle weakness (generalized) (M62.81)     Time: [id number] PT Time Calculation (min) (ACUTE ONLY): 19 min  Charges:  $Therapeutic Activity: 8-22 mins                     [place] Pager [phone] Office [place] 01/07/2020, 1:11 PM

## 2020-01-08 ENCOUNTER — Inpatient Hospital Stay (HOSPITAL_COMMUNITY): Payer: Medicare Other | Admitting: Certified Registered Nurse Anesthetist

## 2020-01-08 ENCOUNTER — Other Ambulatory Visit: Payer: Self-pay | Admitting: Internal Medicine

## 2020-01-08 ENCOUNTER — Inpatient Hospital Stay (HOSPITAL_COMMUNITY): Payer: Medicare Other

## 2020-01-08 DIAGNOSIS — R600 Localized edema: Secondary | ICD-10-CM

## 2020-01-08 LAB — POCT I-STAT 7, (LYTES, BLD GAS, ICA,H+H)
Acid-base deficit: 16 mmol/L — ABNORMAL HIGH (ref 0.0–2.0)
Acid-base deficit: 21 mmol/L — ABNORMAL HIGH (ref 0.0–2.0)
Bicarbonate: 10.4 mmol/L — ABNORMAL LOW (ref 20.0–28.0)
Bicarbonate: 6.9 mmol/L — ABNORMAL LOW (ref 20.0–28.0)
Calcium, Ion: 1.07 mmol/L — ABNORMAL LOW (ref 1.15–1.40)
Calcium, Ion: 1.03 mmol/L — ABNORMAL LOW (ref 1.15–1.40)
HCT: 28 % — ABNORMAL LOW (ref 36.0–46.0)
HCT: 27 % — ABNORMAL LOW (ref 36.0–46.0)
Hemoglobin: 9.5 g/dL — ABNORMAL LOW (ref 12.0–15.0)
Hemoglobin: 9.2 g/dL — ABNORMAL LOW (ref 12.0–15.0)
O2 Saturation: 100 %
O2 Saturation: 99 %
Patient temperature: 98.6
Potassium: 3.9 mmol/L (ref 3.5–5.1)
Potassium: 4.9 mmol/L (ref 3.5–5.1)
Sodium: 147 mmol/L — ABNORMAL HIGH (ref 135–145)
Sodium: 145 mmol/L (ref 135–145)
TCO2: 11 mmol/L — ABNORMAL LOW (ref 22–32)
TCO2: 8 mmol/L — ABNORMAL LOW (ref 22–32)
pCO2 arterial: 24.8 mmHg — ABNORMAL LOW (ref 32.0–48.0)
pCO2 arterial: 21.5 mmHg — ABNORMAL LOW (ref 32.0–48.0)
pH, Arterial: 7.23 — ABNORMAL LOW (ref 7.350–7.450)
pH, Arterial: 7.113 — CL (ref 7.350–7.450)
pO2, Arterial: 299 mmHg — ABNORMAL HIGH (ref 83.0–108.0)
pO2, Arterial: 152 mmHg — ABNORMAL HIGH (ref 83.0–108.0)

## 2020-01-08 LAB — CBC WITH DIFFERENTIAL/PLATELET
Abs Immature Granulocytes: 1.11 10*3/uL — ABNORMAL HIGH (ref 0.00–0.07)
Abs Immature Granulocytes: 2.58 10*3/uL — ABNORMAL HIGH (ref 0.00–0.07)
Basophils Absolute: 0.1 10*3/uL (ref 0.0–0.1)
Basophils Absolute: 0.1 10*3/uL (ref 0.0–0.1)
Basophils Relative: 0 %
Basophils Relative: 0 %
Eosinophils Absolute: 0.1 10*3/uL (ref 0.0–0.5)
Eosinophils Absolute: 0 10*3/uL (ref 0.0–0.5)
Eosinophils Relative: 1 %
Eosinophils Relative: 0 %
HCT: 33.3 % — ABNORMAL LOW (ref 36.0–46.0)
HCT: 29.2 % — ABNORMAL LOW (ref 36.0–46.0)
Hemoglobin: 9.8 g/dL — ABNORMAL LOW (ref 12.0–15.0)
Hemoglobin: 8.3 g/dL — ABNORMAL LOW (ref 12.0–15.0)
Immature Granulocytes: 6 %
Immature Granulocytes: 10 %
Lymphocytes Relative: 10 %
Lymphocytes Relative: 4 %
Lymphs Abs: 1.9 10*3/uL (ref 0.7–4.0)
Lymphs Abs: 1.1 10*3/uL (ref 0.7–4.0)
MCH: 29.2 pg (ref 26.0–34.0)
MCH: 30.1 pg (ref 26.0–34.0)
MCHC: 29.4 g/dL — ABNORMAL LOW (ref 30.0–36.0)
MCHC: 28.4 g/dL — ABNORMAL LOW (ref 30.0–36.0)
MCV: 99.1 fL (ref 80.0–100.0)
MCV: 105.8 fL — ABNORMAL HIGH (ref 80.0–100.0)
Monocytes Absolute: 1.4 10*3/uL — ABNORMAL HIGH (ref 0.1–1.0)
Monocytes Absolute: 1.9 10*3/uL — ABNORMAL HIGH (ref 0.1–1.0)
Monocytes Relative: 7 %
Monocytes Relative: 8 %
Neutro Abs: 14.9 10*3/uL — ABNORMAL HIGH (ref 1.7–7.7)
Neutro Abs: 20.1 10*3/uL — ABNORMAL HIGH (ref 1.7–7.7)
Neutrophils Relative %: 76 %
Neutrophils Relative %: 78 %
Platelets: 91 10*3/uL — ABNORMAL LOW (ref 150–400)
Platelets: 77 10*3/uL — ABNORMAL LOW (ref 150–400)
RBC: 3.36 MIL/uL — ABNORMAL LOW (ref 3.87–5.11)
RBC: 2.76 MIL/uL — ABNORMAL LOW (ref 3.87–5.11)
RDW: 16.6 % — ABNORMAL HIGH (ref 11.5–15.5)
RDW: 17 % — ABNORMAL HIGH (ref 11.5–15.5)
WBC: 19.5 10*3/uL — ABNORMAL HIGH (ref 4.0–10.5)
WBC: 25.8 10*3/uL — ABNORMAL HIGH (ref 4.0–10.5)
nRBC: 15.9 % — ABNORMAL HIGH (ref 0.0–0.2)
nRBC: 14.9 % — ABNORMAL HIGH (ref 0.0–0.2)

## 2020-01-08 LAB — COMPREHENSIVE METABOLIC PANEL
ALT: 863 U/L — ABNORMAL HIGH (ref 0–44)
ALT: 969 U/L — ABNORMAL HIGH (ref 0–44)
AST: 900 U/L — ABNORMAL HIGH (ref 15–41)
AST: 1673 U/L — ABNORMAL HIGH (ref 15–41)
Albumin: 2.9 g/dL — ABNORMAL LOW (ref 3.5–5.0)
Albumin: 2.7 g/dL — ABNORMAL LOW (ref 3.5–5.0)
Alkaline Phosphatase: 102 U/L (ref 38–126)
Alkaline Phosphatase: 117 U/L (ref 38–126)
Anion gap: 32 — ABNORMAL HIGH (ref 5–15)
Anion gap: 34 — ABNORMAL HIGH (ref 5–15)
BUN: 81 mg/dL — ABNORMAL HIGH (ref 8–23)
BUN: 85 mg/dL — ABNORMAL HIGH (ref 8–23)
CO2: 10 mmol/L — ABNORMAL LOW (ref 22–32)
CO2: 7 mmol/L — ABNORMAL LOW (ref 22–32)
Calcium: 8.5 mg/dL — ABNORMAL LOW (ref 8.9–10.3)
Calcium: 8.2 mg/dL — ABNORMAL LOW (ref 8.9–10.3)
Chloride: 108 mmol/L (ref 98–111)
Chloride: 108 mmol/L (ref 98–111)
Creatinine, Ser: 3.15 mg/dL — ABNORMAL HIGH (ref 0.44–1.00)
Creatinine, Ser: 4.4 mg/dL — ABNORMAL HIGH (ref 0.44–1.00)
GFR calc Af Amer: 17 mL/min — ABNORMAL LOW (ref >60)
GFR calc Af Amer: 11 mL/min — ABNORMAL LOW (ref >60)
GFR calc non Af Amer: 14 mL/min — ABNORMAL LOW (ref >60)
GFR calc non Af Amer: 10 mL/min — ABNORMAL LOW (ref >60)
Glucose, Bld: 183 mg/dL — ABNORMAL HIGH (ref 70–99)
Glucose, Bld: 99 mg/dL (ref 70–99)
Potassium: 4 mmol/L (ref 3.5–5.1)
Potassium: 5.2 mmol/L — ABNORMAL HIGH (ref 3.5–5.1)
Sodium: 150 mmol/L — ABNORMAL HIGH (ref 135–145)
Sodium: 149 mmol/L — ABNORMAL HIGH (ref 135–145)
Total Bilirubin: 1.7 mg/dL — ABNORMAL HIGH (ref 0.3–1.2)
Total Bilirubin: 2.3 mg/dL — ABNORMAL HIGH (ref 0.3–1.2)
Total Protein: 6.4 g/dL — ABNORMAL LOW (ref 6.5–8.1)
Total Protein: 5.7 g/dL — ABNORMAL LOW (ref 6.5–8.1)

## 2020-01-08 LAB — CULTURE, BLOOD (ROUTINE X 2)
Culture: NO GROWTH
Special Requests: ADEQUATE

## 2020-01-08 LAB — GLUCOSE, CAPILLARY
Glucose-Capillary: 162 mg/dL — ABNORMAL HIGH (ref 70–99)
Glucose-Capillary: 158 mg/dL — ABNORMAL HIGH (ref 70–99)
Glucose-Capillary: 115 mg/dL — ABNORMAL HIGH (ref 70–99)
Glucose-Capillary: 110 mg/dL — ABNORMAL HIGH (ref 70–99)

## 2020-01-08 LAB — LACTIC ACID, PLASMA: Lactic Acid, Venous: >11 mmol/L (ref 0.5–1.9)

## 2020-01-08 LAB — PROCALCITONIN: Procalcitonin: 2.41 ng/mL

## 2020-01-08 LAB — MAGNESIUM: Magnesium: 4.1 mg/dL — ABNORMAL HIGH (ref 1.7–2.4)

## 2020-01-08 MED ORDER — AMIODARONE HCL IN DEXTROSE 360-4.14 MG/200ML-% IV SOLN
INTRAVENOUS | Status: AC
  Administered 2020-01-08: 60 mg/h via INTRAVENOUS
  Filled 2020-01-08: qty 200

## 2020-01-08 MED ORDER — FENTANYL CITRATE (PF) 100 MCG/2ML IJ SOLN
25.0000 ug | INTRAMUSCULAR | Status: DC | PRN
  Filled 2020-01-08: qty 2

## 2020-01-08 MED ORDER — MIDAZOLAM HCL 2 MG/2ML IJ SOLN: 2.0000 mg | INTRAMUSCULAR | Status: DC | PRN

## 2020-01-08 MED ORDER — POLYVINYL ALCOHOL 1.4 % OP SOLN
1.0000 [drp] | Freq: Four times a day (QID) | OPHTHALMIC | Status: DC | PRN
  Filled 2020-01-08: qty 15

## 2020-01-08 MED ORDER — GLYCOPYRROLATE 1 MG PO TABS: 1.0000 mg | ORAL_TABLET | ORAL | Status: DC | PRN

## 2020-01-08 MED ORDER — FENTANYL CITRATE (PF) 100 MCG/2ML IJ SOLN
INTRAMUSCULAR | Status: AC
  Administered 2020-01-08: 100 ug via INTRAVENOUS
  Filled 2020-01-08: qty 2

## 2020-01-08 MED ORDER — DEXTROSE 5 % IV SOLN: INTRAVENOUS | Status: DC

## 2020-01-08 MED ORDER — FENTANYL 2500MCG IN NS 250ML (10MCG/ML) PREMIX INFUSION
25.0000 ug/h | INTRAVENOUS | Status: DC
  Administered 2020-01-08: 09:00:00 50 ug/h via INTRAVENOUS
  Filled 2020-01-08: qty 250

## 2020-01-08 MED ORDER — AMIODARONE HCL IN DEXTROSE 360-4.14 MG/200ML-% IV SOLN
60.0000 mg/h | INTRAVENOUS | Status: AC
  Filled 2020-01-08: qty 200

## 2020-01-08 MED ORDER — GLYCOPYRROLATE 0.2 MG/ML IJ SOLN: 0.2000 mg | INTRAMUSCULAR | Status: DC | PRN

## 2020-01-08 MED ORDER — FENTANYL BOLUS VIA INFUSION
25.0000 ug | INTRAVENOUS | Status: DC | PRN
  Administered 2020-01-08 (×2): 25 ug via INTRAVENOUS
  Filled 2020-01-08: qty 25

## 2020-01-08 MED ORDER — VASOPRESSIN 20 UNIT/ML IV SOLN
0.0400 [IU]/min | INTRAVENOUS | Status: DC
  Administered 2020-01-08: 15:00:00 0.04 [IU]/min via INTRAVENOUS
  Filled 2020-01-08 (×2): qty 2

## 2020-01-08 MED ORDER — MORPHINE BOLUS VIA INFUSION
5.0000 mg | INTRAVENOUS | Status: DC | PRN
  Filled 2020-01-08: qty 5

## 2020-01-08 MED ORDER — ACETAMINOPHEN 325 MG PO TABS: 650.0000 mg | ORAL_TABLET | Freq: Four times a day (QID) | ORAL | Status: DC | PRN

## 2020-01-08 MED ORDER — MORPHINE SULFATE (PF) 2 MG/ML IV SOLN: 2.0000 mg | INTRAVENOUS | Status: DC | PRN

## 2020-01-08 MED ORDER — CHLORHEXIDINE GLUCONATE 0.12% ORAL RINSE (MEDLINE KIT): 15.0000 mL | Freq: Two times a day (BID) | OROMUCOSAL | Status: DC

## 2020-01-08 MED ORDER — ORAL CARE MOUTH RINSE
15.0000 mL | OROMUCOSAL | Status: DC
  Administered 2020-01-08 (×2): 15 mL via OROMUCOSAL

## 2020-01-08 MED ORDER — FENTANYL CITRATE (PF) 100 MCG/2ML IJ SOLN: 25.0000 ug | INTRAMUSCULAR | Status: DC | PRN

## 2020-01-08 MED ORDER — NOREPINEPHRINE 16 MG/250ML-% IV SOLN
0.0000 ug/min | INTRAVENOUS | Status: DC
  Administered 2020-01-08 (×2): 40 ug/min via INTRAVENOUS
  Filled 2020-01-08 (×2): qty 250

## 2020-01-08 MED ORDER — AMIODARONE HCL IN DEXTROSE 360-4.14 MG/200ML-% IV SOLN
30.0000 mg/h | INTRAVENOUS | Status: DC
  Administered 2020-01-08: 30 mg/h via INTRAVENOUS

## 2020-01-08 MED ORDER — MORPHINE 100MG IN NS 100ML (1MG/ML) PREMIX INFUSION
0.0000 mg/h | INTRAVENOUS | Status: DC
  Administered 2020-01-08: 5 mg/h via INTRAVENOUS
  Filled 2020-01-08: qty 100

## 2020-01-08 MED ORDER — ACETAMINOPHEN 650 MG RE SUPP: 650.0000 mg | Freq: Four times a day (QID) | RECTAL | Status: DC | PRN

## 2020-01-08 MED ORDER — FENTANYL CITRATE (PF) 100 MCG/2ML IJ SOLN: 25.0000 ug | Freq: Once | INTRAMUSCULAR | Status: AC

## 2020-01-09 ENCOUNTER — Other Ambulatory Visit: Payer: Self-pay | Admitting: Internal Medicine

## 2020-01-09 DIAGNOSIS — R600 Localized edema: Secondary | ICD-10-CM

## 2020-01-09 LAB — HEPARIN INDUCED PLATELET AB (HIT ANTIBODY): Heparin Induced Plt Ab: 0.094 OD (ref 0.000–0.400)

## 2020-01-09 MED FILL — Medication: Qty: 1 | Status: AC

## 2020-01-14 LAB — SEROTONIN RELEASE ASSAY (SRA)
SRA .2 IU/mL UFH Ser-aCnc: 8 % (ref 0–20)
SRA 100IU/mL UFH Ser-aCnc: 5 % (ref 0–20)

## 2020-01-31 IMAGING — MR MR LUMBAR SPINE W/O CM
4 of 5 series · 18 of 48 positions shown · non-contrast
Comparison: 12/19/2013

CLINICAL DATA: Left worse than right radiculopathy.

EXAM:
MRI LUMBAR SPINE WITHOUT CONTRAST
TECHNIQUE: Multiplanar, multisequence MR imaging of the lumbar spine was
performed. No intravenous contrast was administered.

[Series 6: T2 · sagittal · 4.0mm · 0.43mm/px · 6 of 15 slices shown (1 of 2)]
[im 1/15]
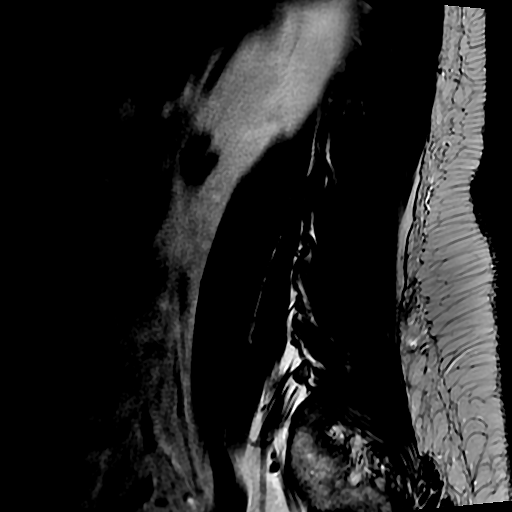
[im 3/15]
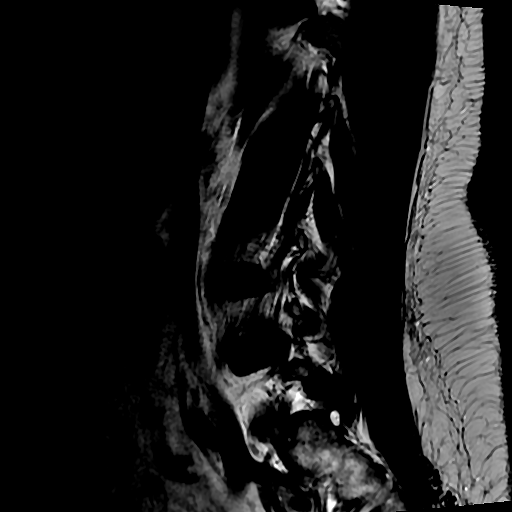
[im 6/15]
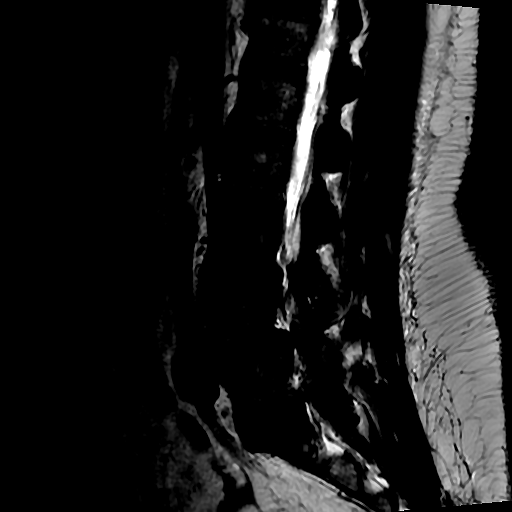
[im 9/15]
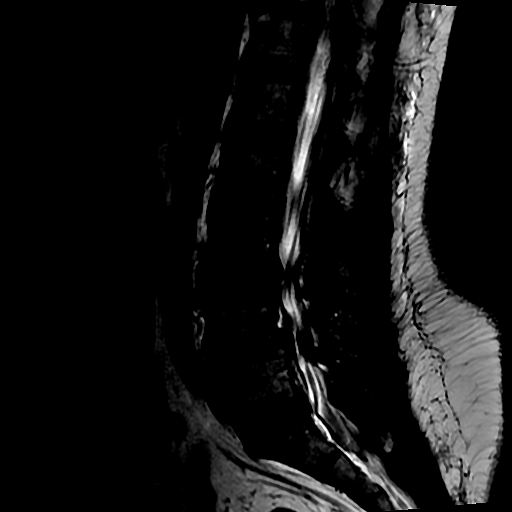
[im 12/15]
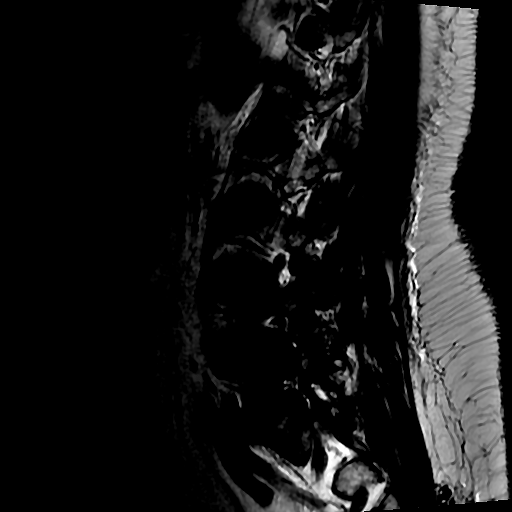
[im 15/15]
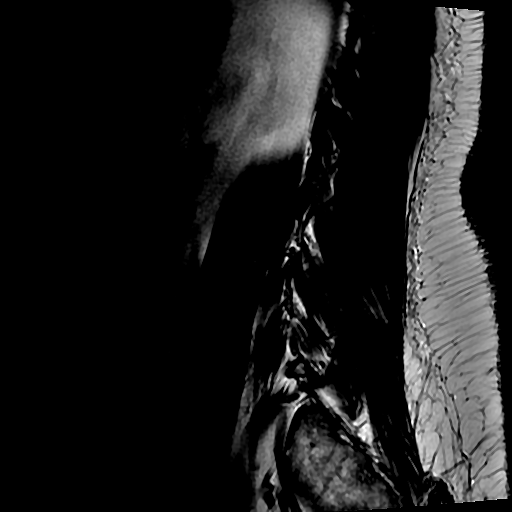

[Series 9: T1 · sagittal · 4.0mm · 0.43mm/px · 3 of 15 slices shown (1 of 2)]
[im 3/15]
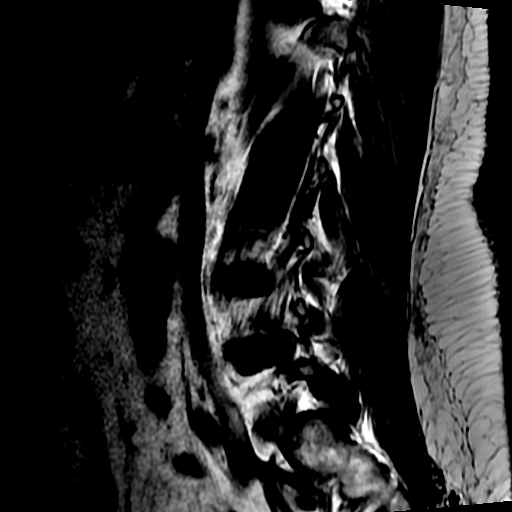
[im 9/15]
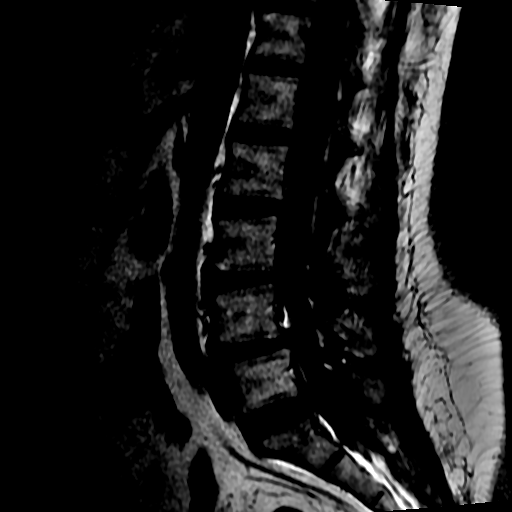
[im 15/15]
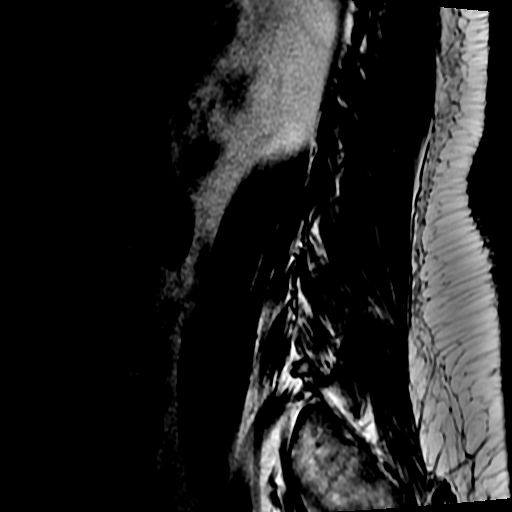

[Series 10: T2 · axial · 4.0mm · 0.39mm/px · z∈[+33,+182]mm · 6 of 36 slices shown (2 of 2)]
[im 1/36]
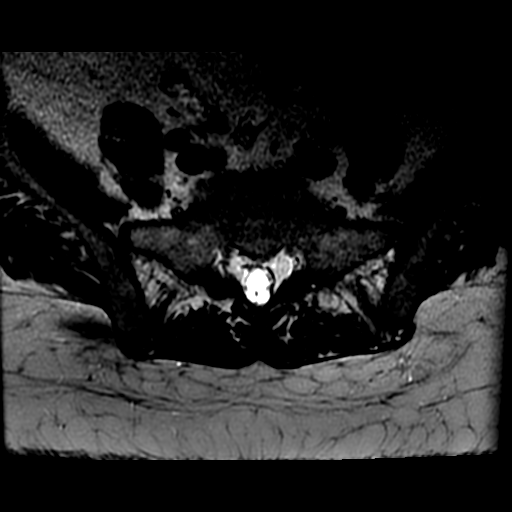
[im 6/36]
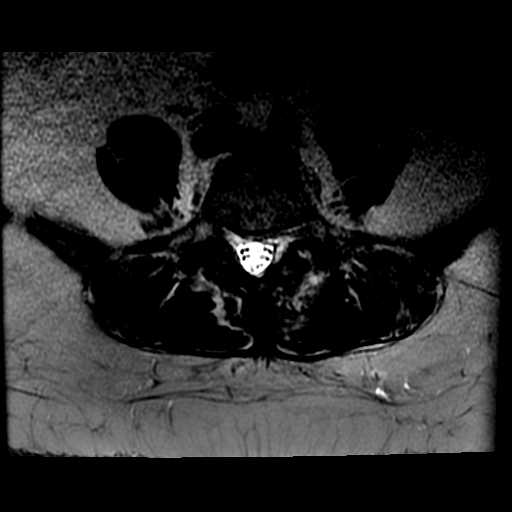
[im 11/36]
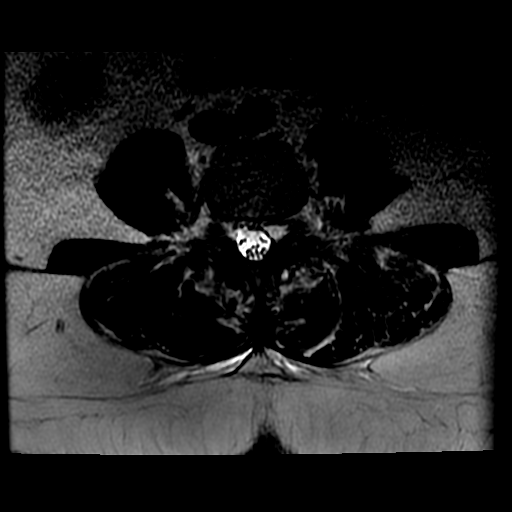
[im 16/36]
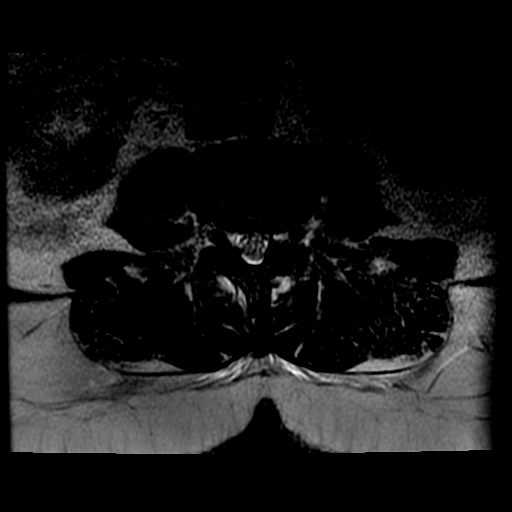
[im 18/36]
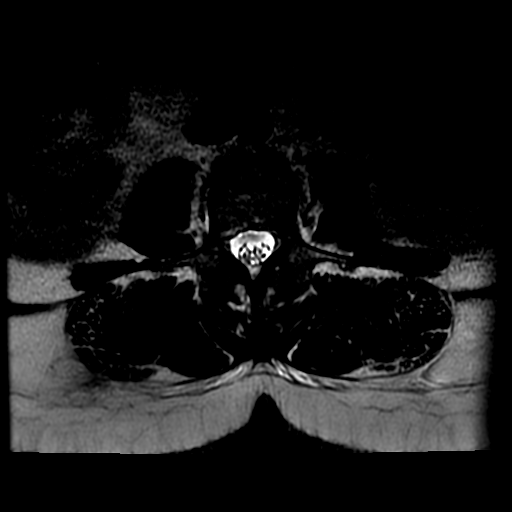
[im 31/36]
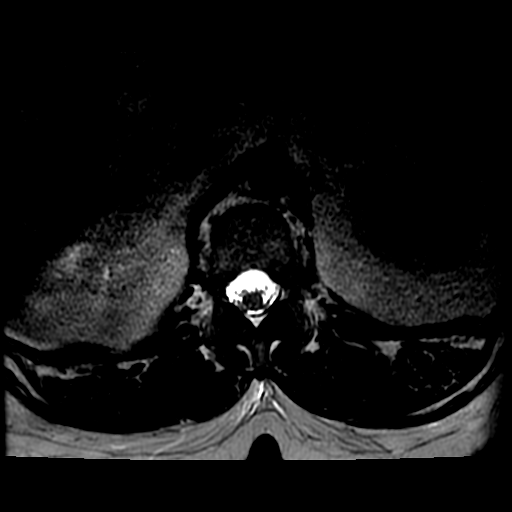

[Series 11: T1 · axial · 4.0mm · 0.39mm/px · z∈[+58,+182]mm · 3 of 36 slices shown (2 of 2)]
[im 6/36]
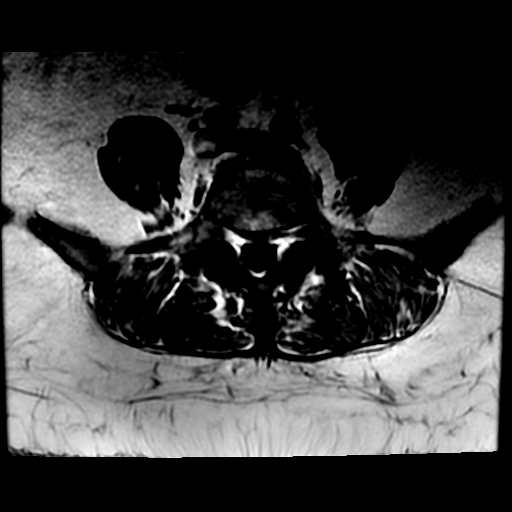
[im 18/36]
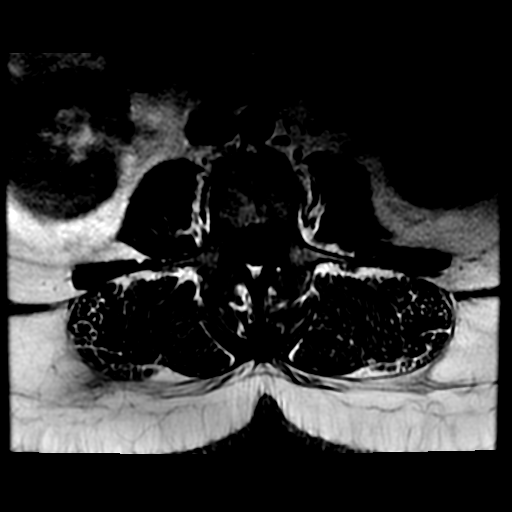
[im 31/36]
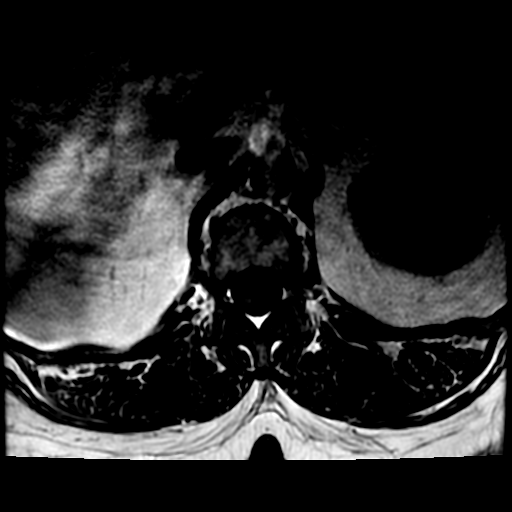

[18 of 48 positions shown; findings below may reference images not displayed]

FINDINGS: Segmentation:  5 lumbar type vertebral bodies.

Alignment:  4 mm degenerative anterolisthesis L4-5.

Vertebrae: No fracture or primary bone lesion. Mild discogenic
endplate changes on the left at L5-S1.

Conus medullaris and cauda equina: Conus extends to the L1-2 level.
Conus and cauda equina appear normal.

Paraspinal and other soft tissues: Negative

Disc levels:

No significant finding at T12-L1, L1-2 or L2-3.

L3-4: Disc degeneration with shallow protrusion. Bilateral facet
degeneration and hypertrophy. Mild multifactorial stenosis at this
level. Findings could certainly contribute to back pain. These
findings of worsened considerably since 8015.

L4-5: Chronic facet arthropathy with 4 mm of anterolisthesis. Disc
degeneration with circumferential protrusion. Stenosis of the
lateral recesses and neural foramina that could be symptomatic.
Slight worsening since the study of 8015.

L5-S1: Mild bulging of the disc. Bilateral facet arthropathy left
worse than right. No compressive canal stenosis. Mild foraminal
narrowing left more than right. Findings have worsened since 8015.
IMPRESSION: 1. L3-4: Disc degeneration with shallow protrusion. Bilateral facet
degeneration and hypertrophy. Mild multifactorial stenosis. Findings
could certainly contribute to back pain. Considerable worsening
since [DATE]. L4-5: Chronic facet arthropathy with 4 mm of anterolisthesis.
Bulging of the disc. Stenosis of the lateral recesses and neural
foramina that could be symptomatic. Mild worsening since [DATE]. L5-S1: Bilateral facet arthropathy left worse than right. Mild
foraminal narrowing left more than right. Findings have worsened
since [DATE].

## 2020-02-02 NOTE — Progress Notes (Signed)
While rounding on the unit learned from Mrs. Radde's nurse she was going to be moved to comfort care.  Nurse reported some family in the room in anticipation of this event.  Checking in with the family and acknowledging how this is a sacred and often private time for the family, I asked if I could help at this time.  Family denied needing any help at this time.  Invited family members to have Chaplain paged if the need for support arose.  De Burrs Chaplain Resident

## 2020-02-02 NOTE — Progress Notes (Signed)
Pt spontaneously went into v tach, found to be pulseless. Code blue called and ACLS initiated. See code sheet for details.

## 2020-02-02 NOTE — Progress Notes (Addendum)
PULMONARY / CRITICAL CARE MEDICINE   NAME:  [name], MRN:  [id number], DOB:  07-31-1951, LOS: 7 ADMISSION DATE:  12/04/2019, CONSULTATION DATE:  01/06/2020 REFERRING MD:  Dr. Truman Hayward, CHIEF COMPLAINT:  Dyspnea  BRIEF HISTORY:    Patient is a 69 yr old female with PMHx of three vessel CAD, diabetes, HFpEF, HTN, breast cancer s/p radiation, obesity who presented with four days of dyspnea. Patient admitted with cardiogenic shock in setting of worsening CHF requiring intubation with brief PEA arrest with ROSC with brief CPR and 1 dose epinephrine. PCCM to admit.  HISTORY OF PRESENT ILLNESS   History obtained via chart review and family at bedside as patient is sedated and mechanically ventilated.   Patient is a 69yr old female with PMHx of three vessel CAD, HFpEF (EF 25-30%), HTN, breast cancer s/p radiation, obesity who presented with four day history of shortness of breath. Patient also noted three day history of chest pain unrelieved by nitroglycerin with associated left arm pain in setting of exertion with associated diaphoresis. Pain subsided with rest. Patient also noted to have lower extremity edema, abdominal bloating and decreased urine output despite medication compliance.   Patient initially found to be significantly hypoxic by EMS requiring NRB en route with improvement of O2 sats to low 90s. Patient improved with breathing treatments with EMS. She continued to have difficulty breathing despite receiving magnesium and solumedrol in the ED. Patient started on BiPAP. CXR with possible fluid overload with elevated BNP and troponins. Patient given small dose of lasix and aspirin. CT chest with bilateral infiltrates concerning for possible PNA, however negative for PE. In the ED, patient became gradually more obtunded and unresponsive on BiPAP requiring intubation. During intubation, patient had brief bradycardic PEA arrest lasting <5 minutes with brief CPR and 1 dose epinephrine with ROSC.  Patient admitted to [facility] for cardiac arrest.   CXR today shows increasing regions of atelectasis versus consolidation. Bilateral pleural effusions present, however only mildly worsened from yesterday. Unasyn added for concern for aspiration pneumonia and was quickly transition to cefepime due to sputum culture growing Enterobacter. Patient had some acute blood lose  Concerned for acute GI bleed. GI consulted and EGD showed erosive gastropathy with remnants of bleeding. NG tube place.   Patient was extubated and pressers weaned off and transitioned to the floor on 04/03.  On 04/04 she began to have nausea and at least 2 episodes of emesis. CXR showed worsening right sided consolidation concerning aspiration vs CHF. She had new oxygen requirement and respiratory distress. Patient was transferred back to the ICU  SIGNIFICANT PAST MEDICAL HISTORY   HFrEF HTN Breast cancer s/p radiation  Obesity   SIGNIFICANT EVENTS:  3/30 - admit to ICU s/p cardiac arrest 4/3 - extubated  4/3 - central line removed. 4/4 - re-admitted to ICU 4/6 - cardiac arrest  STUDIES:   3/30 CTA Chest PE >> No PE, extensive bilateral pulmonary infiltrates w/bilateral pleural effusions R>L, tracheomalacia, postoperative seroma  3/30 Echo >> LVEF 25-30%, global hypokinesis, elevated LVEDP, severe akinesis of LV, inferior, inferoseptal and inferolateral wall; moderately elevated pulmonary artery systolic pressure; mild to moderate elevated MV regurgitation.  4/4 CXR >> Increased right lower lung opacities  CULTURES:  3/30 SARS COVID-19, Influenza A/B >> negative  3/31 Blood cultures >> NGTD at 5 days 3/31 Sputum culture >> Enterobacter aerogenes  ANTIBIOTICS:  Vancomycin x1 3/30 Zosyn x1 3/30 Unasyn 4/1-4/3 Cefepime 4/3 >>  LINES/TUBES:  Right femoral CVC (4/6)  Arterial line (  4/6)  CONSULTANTS:  Cardiology Gastroenterology ([place]) Advanced HF  SUBJECTIVE:   No overnight events until 6:50 AM, at which time  patient suddenly converted from sinus tachycardia to Ventricular tachycardia. ROSC was able to be achieved after 25 minutes. Afterwards, patient recover well without evidence of encephalopathy. She is following commands and answering questions appropriately. She denied any chest pain prior to arrest. She denies pain at this time.   Discussion on next steps was initiated with the patient and her family by Dr. Radford Pax. They have decided they would like to move forward with cath if AHF recommends it. We discussed the risks involved, including blood loss, as well as chance that stenting/ballooning may not be possible. Family expressed understanding.   CONSTITUTIONAL: BP 92/63   Pulse (!) 122   Temp (!) 97.5 F (36.4 C) (Oral)   Resp (!) 26   Ht 5\' 1"  (1.549 m)   Wt 87.6 kg   LMP 07/12/1984   SpO2 93%   BMI 36.49 kg/m   I/O last 3 completed shifts: In: 860.6 [P.O.:240; I.V.:520.5; IV Piggyback:100.1] Out: 1540 [Urine:1540]  FiO2 (%):  [60 %] 60 %  PHYSICAL EXAM: Physical Exam Vitals and nursing note reviewed.  Constitutional:      Appearance: She is obese. She is ill-appearing.     Interventions: She is intubated.  Cardiovascular:     Rate and Rhythm: Regular rhythm. Tachycardia present.  Pulmonary:     Effort: She is intubated.     Comments: ETT tube in place.  Musculoskeletal:     Right lower leg: No tenderness. No edema.     Left lower leg: No tenderness. No edema.  Skin:    General: Skin is warm and dry.  Neurological:     General: No focal deficit present.     Mental Status: She is alert and oriented to person, place, and time.    RESOLVED PROBLEM LIST    ASSESSMENT AND PLAN    Cardiogenic shock in setting or worsening HFrEF NSTEMI, Cardiac arrest x2  Hx three-vessel CAD Patient to the ED in cardiogenic shock secondary to NSTEMI on 3/30. She went into PEA arrest in the ED and ROSC was achieved after several minutes. She was noted to have worsening HFrEF with  severely reduced EF of 25-30% (previously 45-50% in September 2020).   Patient has a history of severe triple vessel disease. CABG was pursed in 2018 but ultimately denied due to patient's refusal of blood products. In Sept 2020, She did undergo successful DES placement in the proximal LAD, as well as PTCA with balloon angioplasty of the obtuse marginal branch. On this admission, ischemic appeared to be in the circumflex region, which was previously attempted to be stented and was unsuccessful. It was determined that stenting would unlikely be successful at this time either.   Discussed with cardiology after V.tach/V fib cardiac arrest this AM. Will consult advanced heart failure and readdress whether cath with possible stenting would be beneficial at this time. Discussed with GI regarding use of AC/DAPT after upper GI bleed; it is okay from their standpoint given severity of cardiac disease.  - Cardiology following, we appreciate their recommendations  - AHF consult  - Monitor strict ins and outs - Daily weights - Levophed, titrate to maintain MAP >65  - Stop Milrinone - Hold Lasix  - Fentanyl gtt for sedation   Acute hypoxic and hypercarbic respiratory failure Multi-factorial: Cardiogenic pulmonary edema + CAP   Ms. Deberry was intubated on initial  presentation to the ED on 3/30 but was able to be successfully extubated on 4/3. Unfortunately, she developed respiratory distress due again on 4/4 requiring BiPAP. On 4/5, she was able to be transitioned to RA and then placed on 2L. After cardiac arrest on 4/6, patient was re-intubated.   - Continue PRVC  Community Acquired Pneumonia Tracheal aspirate growing Enterbacter aerogenes. Unasyn was switched to Cefepime for better coverage. Seven day course planned, currently day 4.   - Cefepime 2 g QD   Tachycardia Continues to be tachycardiac in the 120-130s likely secondary to severe HFrEF. Unable to use BB due to soft BP.  Acute kidney  injury: Patient's baseline Cr 0.9-1.0. Creatinine increased more drastically this AM, likely from most recent cardiac arrest. No electrolyte abnormalities or encephalopathy to suggest emergent dialysis is warranted at this moment. However, she is certainly at high risk of continued worsening given she may have a cardiac cath today.   - Monitor renal function and urine output  Shock Liver Patient developed acute elevations in LFTs on 4/04 have developing cardiogenic shock again. LFTs improving today. Will monitor closely.   - CMP daily - PT/INR daily   Acute blood loss anemia Upper GI bleed EGD showed erosive gastropathy with erosions near G tube; tube removed and bleed improved significantly. Patient's hemoglobin remains stable at this time.   - Continue protonix 40 mg BID  - Monitor CBC  Thrombocytopenia Platelets initially WNL on admission but have been trended down. Today she is at her max low of 91. Her last Heparin dose was 6 days ago. This may be secondary to acute illness versus HIT or other drug induced thrombocytopenia.   - HIT antibodies pending  - Repeat CBC in the AM - Monitor closely for signs of bleeding.   Diabetes mellitus:  A1c 8.6%. Home medications include Metformin.  - SSI (Moderate)  Hx of Hypertension: Continue to hold antihypertensive medications.   Best Practice / Goals of Care / Disposition.   DVT PROPHYLAXIS: SCDs. Heparin on hold due to upper GI bleed NUTRITION: NPO GLYCEMIC: SSI (moderate) MOBILITY: Bedrest BOWEL: Miralax + Senna GOALS OF CARE: DNR, status changed on 4/6 FAMILY DISCUSSIONS: Updated family at bedside on 06-Feb-2020 DISPOSITION ICU  LABS  Glucose Recent Labs  Lab 01/07/20 1143 01/07/20 1533 01/07/20 1735 01/07/20 2014 01/07/20 2336 Feb 06, 2020 0332  GLUCAP 237* 206* 219* 241* 216* 162*    BMET Recent Labs  Lab 01/06/20 0504 01/06/20 0504 01/06/20 1205 01/06/20 1227 01/07/20 0435  NA 143   < > 144 140 145  K 3.5   <  > 4.2 4.1 3.7  CL 105  --  105  --  106  CO2 18*  --  16*  --  21*  BUN 43*  --  54*  --  53*  CREATININE 1.72*  --  1.99*  --  1.80*  GLUCOSE 197*  --  206*  --  211*   < > = values in this interval not displayed.    Liver Enzymes Recent Labs  Lab 01/02/20 0446 01/26/2020 0445 01/06/20 1205  AST 212* 45* 804*  ALT 166* 97* 609*  ALKPHOS 61 58 67  BILITOT 0.2* 0.3 1.1  ALBUMIN 3.0* 2.9* 3.0*    Electrolytes Recent Labs  Lab 02/01/2020 0445 01/15/2020 0445 01/05/20 0442 01/05/20 0442 01/06/20 0504 01/06/20 1205 01/07/20 0435  CALCIUM 8.9   < > 8.8*   < > 8.7* 8.7* 8.9  MG 2.2  --  2.3  --   --   --  2.5*  PHOS 2.9  --   --   --   --   --   --    < > = values in this interval not displayed.    CBC Recent Labs  Lab 01/05/20 0442 01/05/20 0442 01/06/20 0812 01/06/20 1227 2020/01/14 0500  WBC 8.2  --  15.7*  --  19.5*  HGB 8.5*   < > 10.6* 9.5* 9.8*  HCT 27.2*   < > 36.0 28.0* 33.3*  PLT 133*  --  145*  --  91*   < > = values in this interval not displayed.    ABG Recent Labs  Lab 01/02/20 0119 01/06/20 0730 01/06/20 1227  PHART 7.320* 7.381 7.454*  PCO2ART 34.6 23.9* 25.3*  PO2ART 308.0* 58.1* 93.0    Coag's No results for input(s): APTT, INR in the last 168 hours.  Sepsis Markers Recent Labs  Lab 01/03/20 0350 01/06/20 0812 01/06/20 1224  LATICACIDVEN 1.6 >11.0* 5.0*    Cardiac Enzymes No results for input(s): TROPONINI, PROBNP in the last 168 hours.  PAST MEDICAL HISTORY :   She  has a past medical history of Arthritis, Breast cancer ([place]) (2006), CAD (coronary artery disease) (11/07), Cataract, Chronic lower back pain, Chronic pain, Chronic venous insufficiency (2012), Colonic polyp (1995), Diabetes mellitus type II, Diastolic CHF, chronic ([place]), Essential hypertension, GERD (gastroesophageal reflux disease), Heart murmur, History of radiation therapy (10/02/18- 11/09/18), Hyperlipidemia, Iron deficiency anemia, Obesity, OSA (obstructive sleep  apnea) (02/16/2007), OSA on CPAP, Personal history of chemotherapy, Personal history of radiation therapy, Refusal of blood transfusions as patient is Jehovah's Witness, and Seasonal allergies.  PAST SURGICAL HISTORY:  She  has a past surgical history that includes Breast lumpectomy w/ needle localization (2006); Tonsillectomy and adenoidectomy (1964); Breast lumpectomy (Left, 2006); Total abdominal hysterectomy (1985); Colonoscopy w/ biopsies and polypectomy (1994, 2000, 2012, 26/018); Cardiac catheterization (N/A, 08/15/2015); LEFT HEART CATH AND CORONARY ANGIOGRAPHY (N/A, 08/19/2017); Ultrasound guidance for vascular access (08/19/2017); Portacath placement (N/A, 04/14/2018); IR IMAGING GUIDED PORT INSERTION (04/17/2018); Mastectomy modified radical (Bilateral, 07/20/2018); Mastectomy modified radical (Bilateral, 07/20/2018); IR REMOVAL TUN ACCESS W/ PORT W/O FL MOD SED (11/14/2018); LEFT HEART CATH AND CORONARY ANGIOGRAPHY (N/A, 06/13/2019); CORONARY STENT INTERVENTION (N/A, 06/13/2019); and Esophagogastroduodenoscopy (egd) with propofol (N/A, 01/28/2020).  Allergies  Allergen Reactions  . Blood-Group Specific Substance Other (See Comments)    NO BLOOD - JEHOVAH'S WITNESS  . Lisinopril Other (See Comments)    Chronic cough secondary to ACE  . Oxycodone-Acetaminophen Hives, Itching and Other (See Comments)    flushing  . Percocet [Oxycodone-Acetaminophen] Hives  . Victoza [Liraglutide] Other (See Comments)    Made her feel "out of it"  . Atorvastatin Other (See Comments)    myalgias    No current facility-administered medications on file prior to encounter.   Current Outpatient Medications on File Prior to Encounter  Medication Sig  . amLODipine (NORVASC) 10 MG tablet TAKE ONE (1) TABLET BY MOUTH EVERY DAY (Patient taking differently: Take 10 mg by mouth daily. )  . aspirin EC 81 MG tablet Take 81 mg by mouth daily.  Marland Kitchen BRILINTA 90 MG TABS tablet Take 1 tablet by mouth 2 (two) times daily.  .  Coenzyme Q10 (CO Q 10) 100 MG CAPS Take 100 mg by mouth daily.   . furosemide (LASIX) 20 MG tablet TAKE 1 TABLET (20 MG TOTAL) BY MOUTH AS NEEDED FOR EDEMA. (Patient taking differently: Take 20 mg by mouth daily as needed for edema. )  . gabapentin (  NEURONTIN) 300 MG capsule Take 1 capsule (300 mg total) by mouth 3 (three) times daily. (Patient taking differently: Take 300 mg by mouth 3 (three) times daily as needed. )  . guaiFENesin (ROBITUSSIN) 100 MG/5ML SOLN Take 10 mLs by mouth every 4 (four) hours as needed for cough or to loosen phlegm. Uses sugar free  . insulin aspart (NOVOLOG FLEXPEN) 100 UNIT/ML FlexPen Please take 8U in the morning before breakfast and take 10U at lunch and with dinner.  . isosorbide mononitrate (IMDUR) 120 MG 24 hr tablet Take 1 tablet (120 mg total) by mouth 2 (two) times daily.  Marland Kitchen letrozole (FEMARA) 2.5 MG tablet Take 0.5 tablets (1.25 mg total) by mouth daily.  Marland Kitchen losartan (COZAAR) 50 MG tablet Take 1 tablet (50 mg total) by mouth daily.  . metFORMIN (GLUCOPHAGE) 1000 MG tablet TAKE 1 TABLET (1,000 MG TOTAL) BY MOUTH 2 (TWO) TIMES DAILY WITH A MEAL.  . metoprolol succinate (TOPROL-XL) 100 MG 24 hr tablet TAKE ONE (1) TABLET BY MOUTH EVERY DAY (Patient taking differently: Take 100 mg by mouth daily. )  . Multiple Vitamins-Minerals (CENTRUM SILVER PO) Take 1 tablet by mouth daily.  . nitroGLYCERIN (NITROSTAT) 0.4 MG SL tablet PLACE ONE TABLET UNDER THE TONGUE EVERY 5 MINUTES AS NEEDED FOR CHEST PAIN (Patient taking differently: Place 0.4 mg under the tongue every 5 (five) minutes as needed for chest pain. )  . Olopatadine HCl (PATADAY) 0.2 % SOLN Place 1 drop into both eyes daily as needed (for allergies).   Marland Kitchen omeprazole (PRILOSEC) 40 MG capsule Take 1 capsule (40 mg total) by mouth daily. (Patient taking differently: Take 40 mg by mouth daily as needed (for acid reflux). )  . pyridoxine (B-6) 100 MG tablet Take 100 mg by mouth daily.  . RESTASIS 0.05 % ophthalmic  emulsion Place 1 drop into both eyes 2 (two) times daily as needed (dry eyes).   . rosuvastatin (CRESTOR) 10 MG tablet Take 1 tablet (10 mg total) by mouth daily.  Marland Kitchen spironolactone (ALDACTONE) 25 MG tablet Take 2 tablets (50 mg total) by mouth daily. (Patient taking differently: Take 25 mg by mouth daily. )  . TOUJEO SOLOSTAR 300 UNIT/ML SOPN INJECT 35 UNITS INTO THE SKIN DAILY (Patient taking differently: Inject 35 Units into the skin daily. )  . vitamin B-12 (CYANOCOBALAMIN) 1000 MCG tablet Take 1,000 mcg by mouth daily.  Marland Kitchen ACCU-CHEK FASTCLIX LANCETS MISC Check blood sugar 3 times a day (Patient taking differently: 1 each by Other route 3 (three) times daily. )  . Continuous Blood Gluc Receiver (FREESTYLE LIBRE 2 READER) DEVI 1 each by Does not apply route 6 (six) times daily.  . Continuous Blood Gluc Sensor (FREESTYLE LIBRE 2 SENSOR) MISC 1 each by Does not apply route 6 (six) times daily.  Marland Kitchen glucose blood (ACCU-CHEK GUIDE) test strip USE TO CHECK BLOOD SUGARS THREE TIMES DAILY  . Insulin Pen Needle 32G X 4 MM MISC Use to inject insulin 4 times daily under the skin. DIAG CODE E11.319. INSULIN DEPENDENT    FAMILY HISTORY:   Her family history includes Breast cancer in her sister; Depression (age of onset: 32) in her daughter; Diabetes in her father; Heart attack (age of onset: 32) in her brother; Kidney failure in her brother; Lung cancer in her mother; Osteoarthritis in her maternal grandmother; Pulmonary embolism in her brother. There is no history of Colon cancer, Colon polyps, Esophageal cancer, Rectal cancer, or Stomach cancer.  SOCIAL HISTORY:  She  reports  that she has never smoked. She has never used smokeless tobacco. She reports that she does not drink alcohol or use drugs.   Dr. Jose Persia Internal Medicine PGY-1  Pager: [phone] Jan 28, 2020, 8:04 AM    PCCM Attending:   69 yo FM, cardiogenic shock, VT VF arrest this AM, known 3v disease, CPR and shocks this AM. Now with  escalating doses of vasopressors.   BP (!) 103/50   Pulse (!) 116   Temp 99 F (37.2 C) (Axillary)   Resp 15   Ht 5\' 1"  (1.549 m)   Wt 87.6 kg   LMP 07/12/1984   SpO2 100%   BMI 36.49 kg/m   Gen: alert following commands post arrest  Heart; rrr s1 s2 Lungs: bl vented breath sounds   Labs: reviewed, worsening kidney function  Lab Results  Component Value Date   CREATININE 3.15 (H) January 28, 2020   BUN 81 (H) 28-Jan-2020   NA 147 (H) Jan 28, 2020   K 3.9 January 28, 2020   CL 108 January 28, 2020   CO2 10 (L) 01/28/2020   cxr reviewed   A:  Cardiogenic shock  AHRF on MV  3v disease  VT VF arrest  Acute renal failure  Shock liver   P: Escalating pressors.  Appreciate cardiology input Wean vent as tolerated Goal RASS 0 - -1  MAP goal >65  I am not sure of the end point. If she is not a candidate for any additional support I suspect she will pass from this.   This patient is critically ill with multiple organ system failure; which, requires frequent high complexity decision making, assessment, support, evaluation, and titration of therapies. This was completed through the application of advanced monitoring technologies and extensive interpretation of multiple databases. During this encounter critical care time was devoted to patient care services described in this note for 38 minutes.   [facility] 01/28/2020 3:16 PM

## 2020-02-02 NOTE — Progress Notes (Signed)
OT Cancellation Note  Patient Details Name: ASHAI CASABLANCA MRN: [id number] DOB: April 10, 1951   Cancelled Treatment:    Reason Eval/Treat Not Completed: Patient not medically ready;Medical issues which prohibited therapy. VT/VF arrest this am. Resuscitated, now intubated.   Ramond Dial, OT/L   Acute OT Clinical Specialist Acute Rehabilitation Services Pager [phone] Office [phone]  2020/01/17, 2:41 PM

## 2020-02-02 NOTE — Anesthesia Procedure Notes (Addendum)
Procedure Name: Intubation Date/Time: 01-16-2020 7:05 AM Performed by: Janace Litten, CRNA Pre-anesthesia Checklist: Emergency Drugs available, Suction available and Patient being monitored Oxygen Delivery Method: Ambu bag Preoxygenation: Pre-oxygenation with 100% oxygen Ventilation: Mask ventilation without difficulty Tube type: Oral Tube size: 7.5 mm Number of attempts: 1 Airway Equipment and Method: Stylet Placement Confirmation: ETT inserted through vocal cords under direct vision,  breath sounds checked- equal and bilateral and CO2 detector Secured at: 21 cm Tube secured with: Tape Dental Injury: Teeth and Oropharynx as per pre-operative assessment

## 2020-02-02 NOTE — Progress Notes (Signed)
Patient extubated per order of comfort measures. Patient comfortable at this time. RN and family at bedside.

## 2020-02-02 NOTE — Progress Notes (Signed)
PCCM:  Patient with progressive cardiogenic shock and MODS.   Long discussion with family at bedside. They do not want her to suffer and they understand that she will likely pass.   They do not want her to pass connected to machines.   We will plan for comfort care transition once family is ready.   [facility] January 29, 2020 6:43 PM

## 2020-02-02 NOTE — Code Documentation (Signed)
  Patient Name: [name]   MRN: [id number]   Date of Birth/ Sex: 1951/03/19 , female      Admission Date: 12/14/2019  Attending Provider: Garner Nash, DO  Primary Diagnosis: Acute hypoxemic respiratory failure [facility])   Indication: Pt was in her usual state of health until this AM, when she was noted to suddenly convert to V. Tach. Code blue was subsequently called. At the time of arrival on scene, ACLS protocol was underway.   Technical Description:  - CPR performance duration:  25 minutes  - Was defibrillation or cardioversion used? Yes   - Was external pacer placed? No  - Was patient intubated pre/post CPR? Yes   Medications Administered: Y = Yes; Blank = No Amiodarone  Y  Atropine    Calcium    Epinephrine  Y  Lidocaine  Y  Magnesium  Y  Norepinephrine  Y  Phenylephrine    Sodium bicarbonate  Y  Vasopressin     Post CPR evaluation:  - Final Status - Was patient successfully resuscitated ? Yes - What is current rhythm? Sinus tachycardia - What is current hemodynamic status? Unstable.   Miscellaneous Information:  - Labs sent, including: Mg, CMP, ABG  - Primary team notified?  Yes  - Family Notified? Yes  - Additional notes/ transfer status:  Patient alternated between V. Tach and V. Fib.    Dr. Jose Persia Internal Medicine PGY-1  Pager: [phone] 01/26/2020, 7:50 AM

## 2020-02-02 NOTE — Progress Notes (Addendum)
Progress Note  Patient Name: [name] Date of Encounter: 01/26/20  Primary Cardiologist: Minus Breeding, MD   Subjective   Unable to get IJ line in.  Taken off high flow O2 yesterday and on O2 at 2L.  This am had VT/VF arrest and successfully resuscitated and now intubated on Levo gtt at 63.  Awake and follows commands.  Denies any chest pain.   Inpatient Medications    Scheduled Meds: . aspirin EC  81 mg Oral Daily  . Chlorhexidine Gluconate Cloth  6 each Topical Q0600  . furosemide  80 mg Intravenous BID  . insulin aspart  0-15 Units Subcutaneous Q4H  . pantoprazole (PROTONIX) IV  40 mg Intravenous Q12H  . pneumococcal 23 valent vaccine  0.5 mL Intramuscular Tomorrow-1000  . polyethylene glycol  17 g Oral BID  . rosuvastatin  10 mg Oral Daily  . senna  1 tablet Oral BID  . sodium chloride flush  10-40 mL Intracatheter Q12H   Continuous Infusions: . sodium chloride Stopped (01/05/20 2311)  . sodium chloride Stopped (January 26, 2020 0323)  . ceFEPime (MAXIPIME) IV Stopped (01/07/20 1121)  . milrinone 0.25 mcg/kg/min (Jan 26, 2020 0600)  . norepinephrine (LEVOPHED) Adult infusion 40 mcg/min (Jan 26, 2020 0805)   PRN Meds: [CANCELED] Place/Maintain arterial line **AND** sodium chloride, acetaminophen, diphenhydrAMINE, fentaNYL (SUBLIMAZE) injection, fentaNYL (SUBLIMAZE) injection, lip balm, promethazine, sennosides, sodium chloride flush   Vital Signs    Vitals:   01/26/20 0530 Jan 26, 2020 0600 26-Jan-2020 0651 01/26/2020 0700  BP: 93/67 92/63    Pulse:  (!) 127 (!) 122   Resp: (!) 28 (!) 22 (!) 26   Temp:    98.6 F (37 C)  TempSrc:    Axillary  SpO2:  100% 93%   Weight:      Height:        Intake/Output Summary (Last 24 hours) at 2020-01-26 0831 Last data filed at 26-Jan-2020 0600 Gross per 24 hour  Intake 308.35 ml  Output 865 ml  Net -556.65 ml   Last 3 Weights 2020/01/26 01/07/2020 01/06/2020  Weight (lbs) 193 lb 2 oz 198 lb 10.2 oz 204 lb  Weight (kg) 87.6 kg 90.1 kg 92.534 kg       Telemetry    Sinus Tachycardia - personally reviewed  Physical Exam   GEN: Well nourished,, intubated, obese HEENT:intubated NECK: No JVD; No carotid bruits LYMPHATICS: No lymphadenopathy CARDIAC:regular and tachycardic, no murmurs, rubs, gallops RESPIRATORY:  Clear to auscultation without rales, wheezing or rhonchi anteriorly ABDOMEN: Soft, non-tender, non-distended MUSCULOSKELETAL: trace edema; No deformity  SKIN: Warm and dry NEUROLOGIC:  Alert and oriented x 3 PSYCHIATRIC: cannot assess   Labs    High Sensitivity Troponin:   Recent Labs  Lab 12/09/2019 1008 01/02/2020 1922 12/10/2019 2103 01/06/20 1205 01/06/20 1449  TROPONINIHS 3,427* 3,446* 3,906* 11,191* 9,321*      Chemistry Recent Labs  Lab 01/02/20 0446 01/03/20 0350 01/12/2020 0445 01/05/20 0442 01/06/20 0504 01/06/20 0504 01/06/20 1205 01/06/20 1227 01/07/20 0435  NA 137   < > 141   < > 143   < > 144 140 145  K 3.6   < > 3.7   < > 3.5   < > 4.2 4.1 3.7  CL 105   < > 103   < > 105  --  105  --  106  CO2 18*   < > 28   < > 18*  --  16*  --  21*  GLUCOSE 264*   < >  238*   < > 197*  --  206*  --  211*  BUN 40*   < > 35*   < > 43*  --  54*  --  53*  CREATININE 1.83*   < > 1.22*   < > 1.72*  --  1.99*  --  1.80*  CALCIUM 8.1*   < > 8.9   < > 8.7*  --  8.7*  --  8.9  PROT 6.8  --  6.5  --   --   --  6.7  --   --   ALBUMIN 3.0*  --  2.9*  --   --   --  3.0*  --   --   AST 212*  --  45*  --   --   --  804*  --   --   ALT 166*  --  97*  --   --   --  609*  --   --   ALKPHOS 61  --  58  --   --   --  67  --   --   BILITOT 0.2*  --  0.3  --   --   --  1.1  --   --   GFRNONAA 28*   < > 45*   < > 30*  --  25*  --  28*  GFRAA 32*   < > 53*   < > 35*  --  29*  --  33*  ANIONGAP 14   < > 10   < > 20*  --  23*  --  18*   < > = values in this interval not displayed.     Hematology Recent Labs  Lab 01/05/20 0442 01/05/20 0442 01/06/20 0812 01/06/20 1227 2020-01-28 0500  WBC 8.2  --  15.7*  --  19.5*  RBC  2.86*  --  3.61*  --  3.36*  HGB 8.5*   < > 10.6* 9.5* 9.8*  HCT 27.2*   < > 36.0 28.0* 33.3*  MCV 95.1  --  99.7  --  99.1  MCH 29.7  --  29.4  --  29.2  MCHC 31.3  --  29.4*  --  29.4*  RDW 15.1  --  15.8*  --  16.6*  PLT 133*  --  145*  --  91*   < > = values in this interval not displayed.    BNP Recent Labs  Lab 01/07/2020 0446  BNP 2,155.8*     Radiology    DG CHEST PORT 1 VIEW  Result Date: 01/07/2020 CLINICAL DATA:  Right IJ placement aborted EXAM: PORTABLE CHEST 1 VIEW COMPARISON:  01/06/2020 FINDINGS: There is bibasilar airspace disease. There are bilateral small pleural effusions. There is no pneumothorax. The heart mediastinum are stable. There is no acute osseous abnormality. IMPRESSION: Bilateral patchy airspace disease concerning for pneumonia. Electronically Signed   By: Kathreen Devoid   On: 01/07/2020 17:55   [place] EKG SITE RITE  Result Date: 01/06/2020 If Site Rite image not attached, placement could not be confirmed due to current cardiac rhythm.   Patient Profile     69 y.o. female with past medical history of coronary artery disease, hypertension, hyperlipidemia admitted with non-ST elevation myocardial infarction and acute on chronic combined systolic/diastolic congestive heart failure.  Intubated by critical care medicine.  Suffered a brief PEA arrest in the emergency room.  Patient has known three-vessel coronary artery disease and has been felt  not to be a surgical candidate given her refusal to take blood products as she is a Restaurant manager, fast food.  Patient underwent cardiac catheterization September 2020 and was found to have severe three-vessel coronary artery disease.  She had a drug-eluting stent to her proximal LAD.  There was severe stenosis in the first obtuse marginal with moderately severe disease in the circumflex.  She had successful PTCA of the obtuse marginal as a stent could not be placed.   Assessment & Plan    1.  Acute on chronic combined  systolic/diastolic congestive heart failure/cardiogenic shock- -Patient developed recurrent pulmonary edema and dyspnea after extubation.   -she was down to 2L [place] until her cardiac arrest this am and now intubated -she put out 865cc yesterday and is net neg 4.4L and weight down 5lbs from yesterday -attempted IJ cath yesterday to get CO-Ox but unsuccessful and cannot do PICC due to hx of bilateral mastectomies -continue Lasix 80 mg IV twice daily.   -BMET pending this am -Chest xray this am with persistent PNA -12 lead EKG with no ST elevation -will stop Milrinone in setting of cardiac arrest and now on Levo for BP support until seen by AHF -continue Levo gtt and titrate to maintain MAP>65. -continue ASA and statin -no BB due to soft BP -will ask AHF service to consult -likely need to proceed with heart cath  2. Non-ST elevation myocardial infarction/ASCAD -known three-vessel disease (previously turned down for CABG secondary to decline blood product in 2018) s/p DES to LAD in 06/2019 -now with NSTEMI this admit >>bumped hsTrop back up Sunday from 3906 to 11,191  and then trending downward to 9,321 -continue aspirin and statin.   -No heparin in the setting of GI bleed.   -she is tachycardic but cannot use BB in setting of soft BP -She was deemed over the weekend not to currently be a candidate for cardiac catheterization.  As outlined in previous notes her circumflex could not be stented previously.  She also had recent GI bleeding and therefore anticoagulation could not be used. -Given the current events of this am with cardiac arrest I feel we need to proceed with cath to redefine coronary anatomy -GI has cleared her for DAPT if needed  3.  VT/VF cardiac arrest -resuscitated after several defibrillations -now in ST on IV Amio gtt -BMET pending -12 lead EKG with no ST elevation and inferolateral ST abnormality -continue IV Amio gtt -BMET and Mag pending  4.  Pneumonia -possible  aspiration at time of arrest.   -Antibiotics per critical care medicine.  5.  AKI -follow renal function closely with diuresis.  6.  Recent GI bleed -followed by GI -EGD with erosive gastropathy felt related to G tube -no further hematemasis -continue Protonix and follow hemoglobin. -GI ok with using DAPT if needed pending findings on cath  7.  Tachycardia -appears to be sinus in origin on EKG today -? Etiology -check TSH -unfortunately could not get IJ placed for Co-ox and cannot do PICC due to hx of bilateral mastectomies. -SBP low normal on Levo  The patient is critically ill with multiple organ systems failure and requires high complexity decision making for assessment and support, frequent evaluation and titration of therapies, application of advanced monitoring technologies and extensive interpretation of multiple databases. Critical Care Time devoted to patient care services described in this note independent of APP time is 60 minutes with >50% of time spent in direct patient care.    For questions or  updates, please contact [place] Please consult [email or web address] for contact info under        Signed, Fransico Him, MD  16-Jan-2020, 8:31 AM

## 2020-02-02 NOTE — Progress Notes (Signed)
ABG results given to CCM NP. No changes needed at this time.

## 2020-02-02 NOTE — Progress Notes (Signed)
Critical ABG results called to Dr Valeta Harms. No changes at this time. MD to bedside.

## 2020-02-02 NOTE — Progress Notes (Signed)
Speech Language Pathology Discharge Patient Details Name: [name] MRN: [id number] DOB: October 13, 1950 Today's Date: Jan 11, 2020 Time:  -     Patient discharged from SLP services secondary to medical decline - will need to re-order SLP to resume therapy services. Pt coded twice this morning- on vent. Will sign off at this time.   Please see latest therapy progress note for current level of functioning and progress toward goals.    Progress and discharge plan discussed with patient and/or caregiver: Patient unable to participate in discharge planning and no caregivers available  GO     [name] 01/11/2020, 8:58 AM    [name].Ed Risk analyst [phone] Office [phone]

## 2020-02-02 NOTE — Procedures (Signed)
Central Venous Catheter Insertion Procedure Note CALLIAH KEHR [id number] 1951-06-12  Procedure: Insertion of Central Venous Catheter Indications: Assessment of intravascular volume, Drug and/or fluid administration and Frequent blood sampling  Procedure Details Consent: Unable to obtain consent because of emergent medical necessity. Time Out: Verified patient identification, verified procedure, site/side was marked, verified correct patient position, special equipment/implants available, medications/allergies/relevent history reviewed, required imaging and test results available.  Performed  Maximum sterile technique was used including antiseptics, cap, gloves, gown, hand hygiene, mask and sheet. Skin prep: Chlorhexidine; local anesthetic administered A antimicrobial bonded/coated triple lumen catheter was placed in the right femoral vein due to emergent situation using the Seldinger technique. Ultrasound guidance used.Yes.   Catheter placed to 20 cm. Blood aspirated via all 3 ports and then flushed x 3. Line sutured x 2 and dressing applied.  Evaluation Blood flow good Complications: No apparent complications Patient did tolerate procedure well.   Richardson Landry Mylo Driskill ACNP Acute Care Nurse Practitioner [place] Please consult Amion 2020/02/07, 7:41 AM

## 2020-02-02 NOTE — Progress Notes (Signed)
Nutrition Follow-up  DOCUMENTATION CODES:   Obesity unspecified  INTERVENTION:   When hemodynamically stable, recommend begin TF via OGT:   Vital High Protein at 50 ml/h (1200 ml per day)   Provides 1200 kcal, 105 gm protein, 1003 ml free water daily  NUTRITION DIAGNOSIS:   Inadequate oral intake related to acute illness as evidenced by NPO status.  Ongoing   GOAL:   Provide needs based on ASPEN/SCCM guidelines  Unmet, TF off  MONITOR:   Labs, Weight trends, Vent status  REASON FOR ASSESSMENT:   Ventilator    ASSESSMENT:   69 yo female admitted with cardiogenic shock, worsening CHF requiring intubation with brief PEA arrest. PMH includes CAD, DM, CHF, HTN, breast ca s/p radiation, obesity, GERD.  Patient was extubated 4/3. VT/VF arrest this morning, was resuscitated, and re-intubated.  Patient is currently intubated on ventilator support. MV: 9.6 L/min Temp (24hrs), Avg:98.2 F (36.8 C), Min:97.5 F (36.4 C), Max:99 F (37.2 C) MAP 64-62-52-48   Labs reviewed.  CBG's: [phone]  Medications reviewed and include vasopressin, levophed, lasix, novolog, miralax, senokot.  Weight down to 87.6 kg today, lowest weight since admission. Admit weight 90.7 kg I/O -3.6 L   NUTRITION - FOCUSED PHYSICAL EXAM:    Most Recent Value  Orbital Region  No depletion  Upper Arm Region  No depletion  Thoracic and Lumbar Region  No depletion  Buccal Region  Unable to assess  Temple Region  No depletion  Clavicle Bone Region  No depletion  Clavicle and Acromion Bone Region  No depletion  Scapular Bone Region  No depletion  Dorsal Hand  No depletion  Patellar Region  No depletion  Anterior Thigh Region  No depletion  Posterior Calf Region  No depletion       Diet Order:   Diet Order    None      EDUCATION NEEDS:   Not appropriate for education at this time  Skin:  Skin Assessment: Reviewed RN Assessment  Last BM:  4/6  Height:   Ht Readings  from Last 1 Encounters:  01/05/20 5\' 1"  (1.549 m)    Weight:   Wt Readings from Last 1 Encounters:  01/10/2020 87.6 kg    Ideal Body Weight:  47.7 kg  BMI:  Body mass index is 36.49 kg/m.  Estimated Nutritional Needs:   Kcal:  [id number]  Protein:  96-115 g  Fluid:  >/=1.5 L    Molli Barrows, RD, LDN, CNSC Please refer to Amion for contact information.

## 2020-02-02 NOTE — Progress Notes (Signed)
PCCM INTERVAL PROGRESS NOTE  [name] suffered cardiac arrest this morning at approximately 650. She has been VT/VF for approximately 30 minutes off and on. I have discussed this with family and they feel she would not want to undergo any further CPR. Aggressive medical therapies OK. Fortunately [name] is awake post code. I asked if she could hear me and she clearly nodded her head "yes".  Plan: DNR Full aggressive medical care otherwise.  Will hand her care off to the 36M rounding team.   Georgann Housekeeper, AGACNP-BC [place] for personal pager PCCM on call pager [phone]  01-Feb-2020 7:39 AM

## 2020-02-02 NOTE — Progress Notes (Signed)
Per order patient terminally extubated at 01-Jan-2010. Patient's family by bedside. Drips titrated in half over 10 minutes. Morphine was started. Patient deceased at 38. Anderson Malta, RN and Laquentin Loudermilk, RN pronounce patient's time of death at 01-02-2019. [facility] was contacted by [employer] to update. 75ML of Morphine and 175ML of Fentaynl was wasted and witnessed by Vivia Ewing, RN and Camillo Flaming, RN.

## 2020-02-02 NOTE — Death Summary Note (Signed)
DEATH SUMMARY   Patient Details  Name: [name] MRN: [id number] DOB: 03-02-1951  Admission/Discharge Information   Admit Date:  Jan 05, 2020  Date of Death: Date of Death: Jan 12, 2020  Time of Death: Time of Death: 12-30-18  Length of Stay: 7  Referring Physician: No primary care provider on file.   Reason(s) for Hospitalization  Patient is a 69 yr old female with PMHx of three vessel CAD, diabetes, HFpEF, HTN, breast cancer s/p radiation, obesity who presented with four days of dyspnea. Patient admitted with cardiogenic shock in setting of worsening CHF requiring intubation with brief PEA arrest with ROSC with brief CPR and 1 dose epinephrine. PCCM to admit.  Diagnoses  Preliminary cause of death: Cardiogenic shock ([place]) Secondary Diagnoses (including complications and co-morbidities):  Principal Problem:   Acute hypoxemic respiratory failure ([place]) Active Problems:   CAD (coronary artery disease)   Morbid obesity ([place])   Malignant neoplasm of lower-inner quadrant of left breast in female, estrogen receptor positive ([place])   DM type 2, uncontrolled, with retinopathy ([place])   Prolonged Q-T interval on ECG   Chronic diastolic heart failure (HCC)   Decompensated heart failure (HCC)   NSTEMI (non-ST elevated myocardial infarction) ([place])   Cardiac arrest ([place])   Acute on chronic systolic congestive heart failure (HCC)   Erosive gastritis with hemorrhage   Sinus tachycardia   Brief Hospital Course (including significant findings, care, treatment, and services provided and events leading to death)  [name] is a 69 y.o. year old female who Patient is a 69yr old female with PMHx of three vessel CAD, HFpEF (EF 25-30%), HTN, breast cancer s/p radiation, obesity who presented with four day history of shortness of breath. Patient also noted three day history of chest pain unrelieved by nitroglycerin with associated left arm pain in setting of exertion with associated diaphoresis. Pain  subsided with rest. Patient also noted to have lower extremity edema, abdominal bloating and decreased urine output despite medication compliance.   Patient initially found to be significantly hypoxic by EMS requiring NRB en route with improvement of O2 sats to low 90s. Patient improved with breathing treatments with EMS. She continued to have difficulty breathing despite receiving magnesium and solumedrol in the ED. Patient started on BiPAP. CXR with possible fluid overload with elevated BNP and troponins. Patient given small dose of lasix and aspirin. CT chest with bilateral infiltrates concerning for possible PNA, however negative for PE. In the ED, patient became gradually more obtunded and unresponsive on BiPAP requiring intubation. During intubation, patient had brief bradycardic PEA arrest lasting <5 minutes with brief CPR and 1 dose epinephrine with ROSC. Patient admitted to [facility] for cardiac arrest.   CXR today shows increasing regions of atelectasis versus consolidation. Bilateral pleural effusions present, however only mildly worsened from yesterday.Unasyn added for concern for aspiration pneumonia and was quickly transition to cefepime due to sputum culture growing Enterobacter. Patient had some acute blood lose  Concerned for acute GI bleed. GI consulted and EGD showed erosive gastropathy with remnants of bleeding. NG tube place.   Patient was extubated and pressers weaned off and transitioned to the floor on 04/03. On 04/04 she began to have nausea and at least 2 episodes of emesis. CXR showed worsening right sided consolidation concerning aspiration vs CHF. She had new oxygen requirement and respiratory distress. Patient was transferred back to the ICU  Cardiogenic shock in setting or worsening HFrEF NSTEMI, Cardiac arrest x2  Hx three-vessel CAD Patient to the ED  in cardiogenic shock secondary to NSTEMI on 3/30. She went into PEA arrest in the ED and ROSC was achieved after several  minutes. She was noted to have worsening HFrEF with severely reduced EF of 25-30% (previously 45-50% in September 2020).   Patient has a history of severe triple vessel disease. CABG was pursed in 2018 but ultimately denied due to patient's refusal of blood products. In Sept 2020, She did undergo successful DES placement in the proximal LAD, as well as PTCA with balloon angioplasty of the obtuse marginal branch. On this admission, ischemic appeared to be in the circumflex region, which was previously attempted to be stented and was unsuccessful. It was determined that stenting would unlikely be successful at this time either.   Discussed with cardiology after V.tach/V fib cardiac arrest this AM. Will consult advanced heart failure and readdress whether cath with possible stenting would be beneficial at this time. Discussed with GI regarding use of AC/DAPT after upper GI bleed; it is okay from their standpoint given severity of cardiac disease.  Acute hypoxic and hypercarbic respiratory failure Multi-factorial: Cardiogenic pulmonary edema + CAP [name] was intubated on initial presentation to the ED on 3/30 but was able to be successfully extubated on 4/3. Unfortunately, she developed respiratory distress due again on 4/4 requiring BiPAP. On 4/5, she was able to be transitioned to RA and then placed on 2L. After cardiac arrest on 4/6, patient was re-intubated.  - Continue PRVC  Community Acquired Pneumonia Tracheal aspirate growing Enterbacter aerogenes. Unasyn was switched to Cefepime for better coverage. Seven day course planned, currently day 4.  - Cefepime 2 g QD   Tachycardia Continues to be tachycardiac in the 120-130s likely secondary to severe HFrEF. Unable to use BB due to soft BP.  Acute kidney injury: Patient's baseline Cr 0.9-1.0. Creatinine increased more drastically this AM, likely from most recent cardiac arrest. No electrolyte abnormalities or encephalopathy to suggest  emergent dialysis is warranted at this moment. - Monitor renal function and urine output  Shock Liver Patient developed acute elevations in LFTs on 4/04 have developing cardiogenic shock again. LFTs improving today. Will monitor closely.   Acute blood lossanemia UpperGI bleed EGD showed erosive gastropathy with erosions near G tube; tube removed and bleed improved significantly. Patient's hemoglobin remains stable at this time.   Thrombocytopenia Platelets initially WNL on admission but have been trended down. Today she is at her max low of 91. Her last Heparin dose was 6 days ago. This may be secondary to acute illness versus HIT or other drug induced thrombocytopenia.   Diabetes mellitus:  A1c 8.6%. Home medications include Metformin. - SSI (Moderate)  Hx of Hypertension: Continue to hold antihypertensive medications.   Patient had progressive cardiogenic shock following a VT cardiac arrest. Decision was made for transition to comfort care.   Pertinent Labs and Studies  Significant Diagnostic Studies CT Angio Chest PE W and/or Wo Contrast  Result Date: 12/29/2019 CLINICAL DATA:  Shortness of breath. EXAM: CT ANGIOGRAPHY CHEST WITH CONTRAST TECHNIQUE: Multidetector CT imaging of the chest was performed using the standard protocol during bolus administration of intravenous contrast. Multiplanar CT image reconstructions and MIPs were obtained to evaluate the vascular anatomy. CONTRAST:  46mL OMNIPAQUE IOHEXOL 350 MG/ML SOLN COMPARISON:  Chest CT dated 04/20/2018 FINDINGS: Cardiovascular: No pulmonary emboli. Aortic atherosclerosis. Extensive coronary artery calcifications. Borderline cardiomegaly. No pericardial effusion. 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 Thyroid gland esophagus are normal. No hilar or mediastinal.  Lungs/Pleura: The patient has extensive bilateral pulmonary infiltrates as well as small bilateral pleural effusions, right  greater than left. No pulmonary edema. Upper Abdomen: Normal. Musculoskeletal: There is a fluid collection in the left anterolateral chest wall measuring 9.5 by 3.1 by 7.4 cm. Patient has had bilateral mastectomies. This may represent a postoperative seroma. Was not present on the prior study. No bone abnormality. Review of the MIP images confirms the above findings. IMPRESSION: 1. No pulmonary emboli. 2. Extensive bilateral pulmonary infiltrates with small bilateral pleural effusions, right greater than left. 3. Tracheomalacia, new since the prior study. 4. Fluid collection in the left anterolateral chest wall, probably a postoperative seroma. Aortic Atherosclerosis (ICD10-I70.0). Electronically Signed   By: Lorriane Shire M.D.   On: 12/03/2019 12:27   DG Chest Port 1 View  Result Date: 16-Jan-2020 CLINICAL DATA:  Check endotracheal tube placement EXAM: PORTABLE CHEST 1 VIEW COMPARISON:  01/07/2020 FINDINGS: Endotracheal tube is seen 2.4 cm above the carina. Gastric catheter extends into the stomach. Cardiac shadow is stable. Patchy bibasilar atelectasis is noted. No acute bony abnormality is seen. IMPRESSION: Patchy bibasilar atelectasis. Tubes and lines in satisfactory position. Electronically Signed   By: Inez Catalina M.D.   On: 01-16-20 10:22   DG CHEST PORT 1 VIEW  Result Date: 01/07/2020 CLINICAL DATA:  Right IJ placement aborted EXAM: PORTABLE CHEST 1 VIEW COMPARISON:  01/06/2020 FINDINGS: There is bibasilar airspace disease. There are bilateral small pleural effusions. There is no pneumothorax. The heart mediastinum are stable. There is no acute osseous abnormality. IMPRESSION: Bilateral patchy airspace disease concerning for pneumonia. Electronically Signed   By: Kathreen Devoid   On: 01/07/2020 17:55   DG CHEST PORT 1 VIEW  Result Date: 01/06/2020 CLINICAL DATA:  Hypoxia EXAM: PORTABLE CHEST 1 VIEW COMPARISON:  01/05/2020 FINDINGS: Mild cardiomegaly. Increased right lower lung opacities. No  pneumothorax or sizable pleural effusion. Endotracheal tube has been removed. Left IJ central venous catheter has also been removed. IMPRESSION: Increased right lower lung opacities. Electronically Signed   By: Ulyses Jarred M.D.   On: 01/06/2020 05:23   DG CHEST PORT 1 VIEW  Result Date: 01/05/2020 CLINICAL DATA:  Pleural effusion, in-patient. EXAM: PORTABLE CHEST 1 VIEW COMPARISON:  Chest x-ray dated 01/20/2020. FINDINGS: Persistent bibasilar opacities, compatible with small pleural effusions and/or atelectasis. Central pulmonary vascular congestion. Upper lungs remain clear. No pneumothorax is seen. Borderline cardiomegaly is stable. Enteric tube has been removed. Endotracheal tube remains well positioned with tip approximately 3-4 cm above the carina. LEFT IJ central line is adequately positioned with tip at the level of the mid/upper SVC. IMPRESSION: 1. Persistent bibasilar opacities, most likely small pleural effusions and/or atelectasis. 2. Central pulmonary vascular congestion suggesting mild volume overload/CHF. 3. Enteric tube has been removed. Electronically Signed   By: Franki Cabot M.D.   On: 01/05/2020 08:16   DG Chest Port 1 View  Result Date: 01/10/2020 CLINICAL DATA:  69 year old female with history of acute respiratory failure with hypoxia. EXAM: PORTABLE CHEST 1 VIEW COMPARISON:  Chest x-ray 01/03/2020. FINDINGS: An endotracheal tube is in place with tip 3.6 cm above the carina. There is a left-sided internal jugular central venous catheter with tip terminating in the mid superior vena cava. A nasogastric tube is seen extending into the stomach, however, the tip of the nasogastric tube extends below the lower margin of the image. Lung volumes are slightly low. Bibasilar opacities which may reflect areas of atelectasis and/or consolidation. Small bilateral pleural effusions. No pneumothorax. No  evidence of pulmonary edema. Heart size is mildly enlarged. Upper mediastinal contours are within  normal limits. Aortic atherosclerosis. Surgical clips project over the chest wall and axillary regions bilaterally, likely from prior axillary nodal dissection. IMPRESSION: 1. Support apparatus, as above. 2. Worsening aeration in the lung bases with increasing regions of atelectasis and/or consolidation and small bilateral pleural effusions. 3. Mild cardiomegaly. 4. Aortic atherosclerosis. Electronically Signed   By: Vinnie Langton M.D.   On: 02/01/2020 05:26   Portable Chest x-ray  Result Date: 01/03/2020 CLINICAL DATA:  Intubation. EXAM: PORTABLE CHEST 1 VIEW COMPARISON:  12/08/2019.  CT 01/02/2020. FINDINGS: Endotracheal tube tip noted 4 cm above the carina. NG tube, left IJ line stable position. Heart size normal. Bibasilar infiltrates/edema and small bilateral pleural effusions again noted. No interim change. Surgical clips both axilla. IMPRESSION: 1. Endotracheal tube tip noted 4 cm above the carina. Lines and tubes in stable position. 2. Cardiomegaly with bibasilar pulmonary infiltrates/edema and small bilateral pleural effusions. No interim change from prior exam. Electronically Signed   By: Marcello Moores  Register   On: 01/03/2020 13:42   DG Chest Port 1 View  Result Date: 01/03/2020 CLINICAL DATA:  Acute respiratory failure.  Hypoxia. EXAM: PORTABLE CHEST 1 VIEW COMPARISON:  12/09/2019. FINDINGS: Endotracheal tube, NG tube, left IJ line noted in stable position. Cardiomegaly. Interim improvement of bilateral pulmonary infiltrates/edema with residual noted in the right lung base. No prominent pleural effusion. No pneumothorax. Surgical clips both axilla. IMPRESSION: 1.  Lines and tubes in stable position. 2.  Cardiomegaly. 3. Interim improvement of bilateral pulmonary infiltrates/edema with residual noted in the right lung base. Electronically Signed   By: Marcello Moores  Register   On: 01/03/2020 05:48   DG CHEST PORT 1 VIEW  Result Date: 12/21/2019 CLINICAL DATA:  ETT placement EXAM: PORTABLE CHEST 1 VIEW  COMPARISON:  12/28/2019 FINDINGS: Endotracheal tube tip is about 3 cm superior to the carina. Left IJ central venous catheter tip over the SVC. No pneumothorax is seen. Esophageal tube tip in the left upper quadrant. Cardiomegaly with bilateral ground-glass opacity and consolidations, slightly improved. Clips within both axilla IMPRESSION: 1. Endotracheal tube tip about 3 cm superior to carina 2. Left IJ central venous catheter tip over the SVC, no pneumothorax. 3. Continued cardiomegaly and central vascular congestion. Fairly extensive bilateral ground-glass opacities/probable edema though with some improvement in aeration compared to earlier radiograph. More confluent consolidation at the right greater than left base could reflect pneumonia. Electronically Signed   By: Donavan Foil M.D.   On: 12/31/2019 19:15   DG Chest Portable 1 View  Result Date: 12/10/2019 CLINICAL DATA:  Intubated, shortness of breath EXAM: PORTABLE CHEST 1 VIEW COMPARISON:  12/08/2019 FINDINGS: Single frontal view of the chest demonstrates endotracheal tube overlying tracheal air column tip at level of thoracic inlet. Enteric catheter passes below diaphragm tip excluded by collimation. Cardiac silhouette is enlarged. There is worsening bilateral airspace disease in a perihilar distribution. The pleural effusion seen on chest CT are less apparent by radiograph. No pneumothorax. No acute bony abnormalities. IMPRESSION: 1. Support devices as above. 2. Findings consistent with progressive congestive heart failure. Electronically Signed   By: Randa Ngo M.D.   On: 12/31/2019 17:35   DG Chest Port 1 View  Result Date: 12/05/2019 CLINICAL DATA:  Shortness of breath and cough EXAM: PORTABLE CHEST 1 VIEW COMPARISON:  June 10, 2019 FINDINGS: There is cardiomegaly with pulmonary venous hypertension. There is mild interstitial edema. There is bibasilar atelectasis. There is no  frank consolidation. No adenopathy. Postoperative change  noted in each axillary region. There is degenerative change in each shoulder. IMPRESSION: Cardiomegaly with pulmonary vascular congestion. Mild interstitial edema. Suspect a degree of underlying congestive heart failure. Bibasilar atelectasis. No consolidation. Electronically Signed   By: Lowella Grip III M.D.   On: 01/02/2020 08:38   ECHOCARDIOGRAM COMPLETE  Result Date: 12/20/2019    ECHOCARDIOGRAM REPORT   Patient Name:   MYLAN TILLER Date of Exam: 12/07/2019 Medical Rec #:  [id number]       Height:       61.0 in Accession #:    [id number]      Weight:       200.0 lb Date of Birth:  1951/09/08       BSA:          1.889 m Patient Age:    72 years        BP:           106/73 mmHg Patient Gender: F               HR:           98 bpm. Exam Location:  Inpatient Procedure: 2D Echo, Color Doppler, Cardiac Doppler and Intracardiac            Opacification Agent STAT ECHO Indications:    R06.9 DOE  History:        Patient has prior history of Echocardiogram examinations, most                 recent 06/11/2019. CHF, CAD; Risk Factors:Hypertension, Diabetes,                 Dyslipidemia and Sleep Apnea. Breast Cancer.  Sonographer:    Raquel Sarna Senior RDCS Referring Phys: [id number] DAN FLOYD  Sonographer Comments: Technically difficult due to mastectomy and patient sitting upright on bipap. IMPRESSIONS  1. Left ventricular ejection fraction, by estimation, is 25 to 30%. The left ventricle has severely decreased function. The left ventricle demonstrates global hypokinesis. The left ventricular internal cavity size was mildly dilated. Left ventricular diastolic parameters are indeterminate. Elevated left ventricular end-diastolic pressure. There is severe akinesis of the left ventricular, entire inferior wall, inferoseptal wall and inferolateral wall.  2. Right ventricular systolic function is mildly reduced. The right ventricular size is normal. There is moderately elevated pulmonary artery systolic pressure.  3. The  mitral valve is abnormal. Mild to moderate mitral valve regurgitation.  4. The aortic valve is tricuspid. Aortic valve regurgitation is trivial. Mild aortic valve sclerosis is present, with no evidence of aortic valve stenosis.  5. The inferior vena cava is dilated in size with <50% respiratory variability, suggesting right atrial pressure of 15 mmHg. Comparison(s): 06/05/2019: LVEF 45-50%, global hypokinesis. FINDINGS  Left Ventricle: Left ventricular ejection fraction, by estimation, is 25 to 30%. The left ventricle has severely decreased function. The left ventricle demonstrates global hypokinesis. Severe akinesis of the left ventricular, entire inferior wall, inferoseptal wall and inferolateral wall. Definity contrast agent was given IV to delineate the left ventricular endocardial borders. The left ventricular internal cavity size was mildly dilated. There is no left ventricular hypertrophy. Left ventricular  diastolic parameters are indeterminate. Elevated left ventricular end-diastolic pressure. Right Ventricle: The right ventricular size is normal. No increase in right ventricular wall thickness. Right ventricular systolic function is mildly reduced. There is moderately elevated pulmonary artery systolic pressure. The tricuspid regurgitant velocity is 2.80 m/s, and with an assumed right atrial pressure  of 15 mmHg, the estimated right ventricular systolic pressure is [id number] mmHg. Left Atrium: Left atrial size was normal in size. Right Atrium: Right atrial size was normal in size. Pericardium: There is no evidence of pericardial effusion. Mitral Valve: The mitral valve is abnormal. There is mild thickening of the mitral valve leaflet(s). Mild to moderate mitral valve regurgitation. Tricuspid Valve: The tricuspid valve is grossly normal. Tricuspid valve regurgitation is trivial. Aortic Valve: The aortic valve is tricuspid. Aortic valve regurgitation is trivial. Mild aortic valve sclerosis is present, with no  evidence of aortic valve stenosis. Pulmonic Valve: The pulmonic valve was grossly normal. Pulmonic valve regurgitation is trivial. Aorta: The aortic root and ascending aorta are structurally normal, with no evidence of dilitation. Venous: The inferior vena cava is dilated in size with less than 50% respiratory variability, suggesting right atrial pressure of 15 mmHg. IAS/Shunts: No atrial level shunt detected by color flow Doppler.  LEFT VENTRICLE PLAX 2D LVIDd:         5.80 cm  Diastology LVIDs:         5.40 cm  LV e' lateral:   5.22 cm/s LV PW:         0.80 cm  LV E/e' lateral: 18.0 LV IVS:        0.50 cm  LV e' medial:    4.90 cm/s LVOT diam:     2.00 cm  LV E/e' medial:  19.1 LV SV:         35 LV SV Index:   18 LVOT Area:     3.14 cm  RIGHT VENTRICLE RV S prime:     8.16 cm/s TAPSE (M-mode): 1.0 cm LEFT ATRIUM           Index       RIGHT ATRIUM           Index LA diam:      3.30 cm 1.75 cm/m  RA Area:     11.40 cm LA Vol (A4C): 41.3 ml 21.87 ml/m RA Volume:   23.50 ml  12.44 ml/m  AORTIC VALVE LVOT Vmax:   59.90 cm/s LVOT Vmean:  42.500 cm/s LVOT VTI:    0.110 m  AORTA Ao Root diam: 3.00 cm MITRAL VALVE               TRICUSPID VALVE MV Area (PHT): 4.89 cm    TR Peak grad:   31.4 mmHg MV Decel Time: 155 msec    TR Vmax:        280.00 cm/s MV E velocity: 93.80 cm/s MV A velocity: 24.20 cm/s  SHUNTS MV E/A ratio:  3.88        Systemic VTI:  0.11 m                            Systemic Diam: 2.00 cm Lyman Bishop MD Electronically signed by Lyman Bishop MD Signature Date/Time: 12/18/2019/2:47:35 PM    Final    [place] EKG SITE RITE  Result Date: 01/06/2020 If Site Rite image not attached, placement could not be confirmed due to current cardiac rhythm.   Microbiology Recent Results (from the past 240 hour(s))  Culture, blood (Routine X 2) w Reflex to ID Panel     Status: None   Collection Time: 01/02/20  5:20 PM   Specimen: BLOOD RIGHT HAND  Result Value Ref Range Status   Specimen Description BLOOD RIGHT  HAND  Final   Special  Requests   Final    BOTTLES DRAWN AEROBIC ONLY Blood Culture results may not be optimal due to an inadequate volume of blood received in culture bottles   Culture   Final    NO GROWTH 5 DAYS Performed at [facility], [place] [address], [place], [place] [address]    Report Status 01/07/2020 FINAL  Final  Culture, respiratory (non-expectorated)     Status: None   Collection Time: 01/02/20 11:45 PM   Specimen: Tracheal Aspirate; Respiratory  Result Value Ref Range Status   Specimen Description TRACHEAL ASPIRATE  Final   Special Requests NONE  Final   Gram Stain   Final    NO WBC SEEN NO ORGANISMS SEEN Performed at [facility], [address], [place], [place] [address]    Culture FEW ENTEROBACTER AEROGENES  Final   Report Status 01/05/2020 FINAL  Final   Organism ID, Bacteria ENTEROBACTER AEROGENES  Final      Susceptibility   Enterobacter aerogenes - MIC*    CEFAZOLIN >=64 RESISTANT Resistant     CEFEPIME <=0.12 SENSITIVE Sensitive     CEFTAZIDIME <=1 SENSITIVE Sensitive     CEFTRIAXONE <=0.25 SENSITIVE Sensitive     CIPROFLOXACIN <=0.25 SENSITIVE Sensitive     GENTAMICIN <=1 SENSITIVE Sensitive     IMIPENEM 2 SENSITIVE Sensitive     TRIMETH/SULFA <=20 SENSITIVE Sensitive     PIP/TAZO <=4 SENSITIVE Sensitive     * FEW ENTEROBACTER AEROGENES  Culture, blood (Routine X 2) w Reflex to ID Panel     Status: None   Collection Time: 01/03/20  4:17 AM   Specimen: BLOOD LEFT WRIST  Result Value Ref Range Status   Specimen Description BLOOD LEFT WRIST  Final   Special Requests   Final    BOTTLES DRAWN AEROBIC AND ANAEROBIC Blood Culture adequate volume   Culture   Final    NO GROWTH 5 DAYS Performed at [facility], [address], [place],  [address]    Report Status 2020-01-26 FINAL  Final    Lab Basic Metabolic Panel: Recent Labs  Lab 01/06/20 0504 01/06/20 0504 01/06/20 1205 01/06/20 1227 01/07/20 0435 01/26/20 0908  01/26/20 0918 Jan 26, 2020 1706 2020-01-26 1752  NA 143   < > 144   < > 145 150* 147* 145 149*  K 3.5   < > 4.2   < > 3.7 4.0 3.9 4.9 5.2*  CL 105  --  105  --  106 108  --   --  108  CO2 18*  --  16*  --  21* 10*  --   --  7*  GLUCOSE 197*  --  206*  --  211* 183*  --   --  99  BUN 43*  --  54*  --  53* 81*  --   --  85*  CREATININE 1.72*  --  1.99*  --  1.80* 3.15*  --   --  4.40*  CALCIUM 8.7*  --  8.7*  --  8.9 8.5*  --   --  8.2*  MG  --   --   --   --  2.5* 4.1*  --   --   --    < > = values in this interval not displayed.   Liver Function Tests: Recent Labs  Lab 01/06/20 1205 01/26/20 0908 01-26-20 1752  AST 804* 900* 1,673*  ALT 609* 863* 969*  ALKPHOS 67 102 117  BILITOT  1.1 1.7* 2.3*  PROT 6.7 6.4* 5.7*  ALBUMIN 3.0* 2.9* 2.7*   No results for input(s): LIPASE, AMYLASE in the last 168 hours. No results for input(s): AMMONIA in the last 168 hours. CBC: Recent Labs  Lab 01/06/20 0812 01/06/20 0812 01/06/20 1227 02-05-2020 0500 02-05-2020 0918 02-05-20 1706 Feb 05, 2020 1752  WBC 15.7*  --   --  19.5*  --   --  25.8*  NEUTROABS  --   --   --  14.9*  --   --  20.1*  HGB 10.6*   < > 9.5* 9.8* 9.5* 9.2* 8.3*  HCT 36.0   < > 28.0* 33.3* 28.0* 27.0* 29.2*  MCV 99.7  --   --  99.1  --   --  105.8*  PLT 145*  --   --  91*  --   --  77*   < > = values in this interval not displayed.   Cardiac Enzymes: No results for input(s): CKTOTAL, CKMB, CKMBINDEX, TROPONINI in the last 168 hours. Sepsis Labs: Recent Labs  Lab 01/06/20 0812 01/06/20 1224 Feb 05, 2020 0500 02-05-20 1752  PROCALCITON  --   --   --  2.41  WBC 15.7*  --  19.5* 25.8*  LATICACIDVEN >11.0* 5.0*  --  >11.0*     Aseel Truxillo L Kili Gracy 01/12/2020, 3:12 PM

## 2020-02-02 NOTE — Consult Note (Addendum)
Advanced Heart Failure Team Consult Note   Primary Physician: Bartholomew Crews, MD PCP-Cardiologist:  Minus Breeding, MD  AHF: New   Reason for Consultation: Acute on Chronic Systolic Heart Failure/ Cardiogenic Shock   HPI:    [name] is seen today for evaluation of acute on chronic systolic HF/ cardiogenic shock at the request of Dr. Radford Pax, Cardiology.   69 y/o AAF w/ h/o 3VCAD previously turned down for CABG in 2018 due to refusal of blood products (Jehovahs Witness), recent admission 06/2019 for NSTEMI treated w/ PCI (see details below), chronic systolic HF (last echo 06/4764 w/ mildly reduced LVEF 45-50%), HTN, HLD and DM.   Her last hospitalization was in 06/2019 for NSTEMI. LHC showed severe stenosis in proximal LAD treated w/ PTCA/DES x 1 proximal LAD, severe stenosis in the first obtuse marginal branch with moderately severe disease in the circumflex, treated w/ balloon angioplasty only of the obtuse marginal branch and moderate disease in the small non-dominant RCA (medical therapy). EF 45-50%. Was placed on DAPT w/ ASA + Brilinta, no statin due to h/o intolerance. She was on GDMT for systolic HF w/ ARB,  blocker and spironolactone.   She was readmitted 3/30 w/ CP and dyspnea/ hypoxic respiratory failure requiring BiPAP. COVID negative. CT of chest negative for PE but did show diffuse bilateral infiltrates and was started on broad spectrum abx. Diagnosed w/ acute CHF. BNP 1562. CXR showed cardiomegaly w/ pulmonary vascular congestion. Hs troponin level also c/w NSTEMI, 3076>>3427>>11,191. She was started on IV heparin for ACS. Echo repeated and showed worseing LV systolic function, w/ EF now at 25-30% w/ global hypokinesis.  During hospitalization she progressed to cardiogenic shock w/ hypotension and worsening renal failure w/ attempts at diuresis. Started on NE and Milrinone.    On 4/6 she had VT/VF arrest and successfully resuscitated and intubated. Milrinone  discontinued. Remains on high dose NE 40 mcg + VP 0.04 (MAPs upper 60s). On IV amiodarone. No recurrent VT. AHF now asked to assist w/ further management.   She is currently awake on vent and following commands. Family present at beside.   Post arrest, SCr bump 1.8 yesterday to 3.15 today. WBC 19.5. AF.     Echo 12/26/2019 1. Left ventricular ejection fraction, by estimation, is 25 to 30%. The  left ventricle has severely decreased function. The left ventricle  demonstrates global hypokinesis. The left ventricular internal cavity size  was mildly dilated. Left ventricular  diastolic parameters are indeterminate. Elevated left ventricular  end-diastolic pressure. There is severe akinesis of the left ventricular,  entire inferior wall, inferoseptal wall and inferolateral wall.  2. Right ventricular systolic function is mildly reduced. The right  ventricular size is normal. There is moderately elevated pulmonary artery  systolic pressure.  3. The mitral valve is abnormal. Mild to moderate mitral valve  regurgitation.  4. The aortic valve is tricuspid. Aortic valve regurgitation is trivial.  Mild aortic valve sclerosis is present, with no evidence of aortic valve  stenosis.  5. The inferior vena cava is dilated in size with <50% respiratory  variability, suggesting right atrial pressure of 15 mmHg.   Review of Systems: [y] = yes, _0  = no   . General: Weight gain _1 ; Weight loss _2 ; Anorexia _3 ; Fatigue _4 ; Fever _5 ; Chills _6 ; Weakness _7   . Cardiac: Chest pain/pressure _8 ; Resting SOB _9 ; Exertional SOB _10 ; Orthopnea _11 ; Pedal Edema _12 ;  Palpitations _0 ; Syncope _1 ; Presyncope _2 ; Paroxysmal nocturnal dyspnea_3   . Pulmonary: Cough _4 ; Wheezing_5 ; Hemoptysis_6 ; Sputum _7 ; Snoring _8   . GI: Vomiting_9 ; Dysphagia_10 ; Melena_11 ; Hematochezia _12 ; Heartburn_13 ; Abdominal pain _14 ; Constipation _15 ; Diarrhea _16 ; BRBPR _17   . GU: Hematuria_18 ; Dysuria _19 ; Nocturia_20     . Vascular: Pain in legs with walking _21 ; Pain in feet with lying flat _22 ; Non-healing sores _23 ; Stroke _24 ; TIA _25 ; Slurred speech _26 ;  . Neuro: Headaches_27 ; Vertigo_28 ; Seizures_29 ; Paresthesias_30 ;Blurred vision _31 ; Diplopia _32 ; Vision changes _33   . Ortho/Skin: Arthritis _34 ; Joint pain _35 ; Muscle pain _36 ; Joint swelling _37 ; Back Pain _38 ; Rash _39   . Psych: Depression_40 ; Anxiety_41   . Heme: Bleeding problems _42 ; Clotting disorders _43 ; Anemia _44   . Endocrine: Diabetes _45 ; Thyroid dysfunction_46   Home Medications Prior to Admission medications   Medication Sig Start Date End Date Taking? Authorizing Provider  amLODipine (NORVASC) 10 MG tablet TAKE ONE (1) TABLET BY MOUTH EVERY DAY Patient taking differently: Take 10 mg by mouth daily.  12/13/18  Yes Bartholomew Crews, MD  aspirin EC 81 MG tablet Take 81 mg by mouth daily.   Yes [provider]  BRILINTA 90 MG TABS tablet Take 1 tablet by mouth 2 (two) times daily. 09/26/19  Yes [provider]  Coenzyme Q10 (CO Q 10) 100 MG CAPS Take 100 mg by mouth daily.    Yes [provider]  furosemide (LASIX) 20 MG tablet TAKE 1 TABLET (20 MG TOTAL) BY MOUTH AS NEEDED FOR EDEMA. Patient taking differently: Take 20 mg by mouth daily as needed for edema.  01/17/19 01/17/20 Yes Bartholomew Crews, MD  gabapentin (NEURONTIN) 300 MG capsule Take 1 capsule (300 mg total) by mouth 3 (three) times daily. Patient taking differently: Take 300 mg by mouth 3 (three) times daily as needed.  11/27/19  Yes Nicholas Lose, MD  guaiFENesin (ROBITUSSIN) 100 MG/5ML SOLN Take 10 mLs by mouth every 4 (four) hours as needed for cough or to loosen phlegm. Uses sugar free   Yes [provider]  insulin aspart (NOVOLOG FLEXPEN) 100 UNIT/ML FlexPen Please take 8U in the morning before breakfast and take 10U at lunch and with dinner. 12/07/19  Yes Kathi Ludwig, MD  isosorbide mononitrate (IMDUR) 120 MG 24 hr tablet Take 1  tablet (120 mg total) by mouth 2 (two) times daily. 07/04/19  Yes Lendon Colonel, NP  letrozole [facility]) 2.5 MG tablet Take 0.5 tablets (1.25 mg total) by mouth daily. 10/19/19  Yes Nicholas Lose, MD  losartan (COZAAR) 50 MG tablet Take 1 tablet (50 mg total) by mouth daily. 07/04/19  Yes Lendon Colonel, NP  metFORMIN (GLUCOPHAGE) 1000 MG tablet TAKE 1 TABLET (1,000 MG TOTAL) BY MOUTH 2 (TWO) TIMES DAILY WITH A MEAL. 12/03/19  Yes Bartholomew Crews, MD  metoprolol succinate (TOPROL-XL) 100 MG 24 hr tablet TAKE ONE (1) TABLET BY MOUTH EVERY DAY Patient taking differently: Take 100 mg by mouth daily.  01/17/19  Yes Bartholomew Crews, MD  Multiple Vitamins-Minerals (CENTRUM SILVER PO) Take 1 tablet by mouth daily.   Yes [provider]  nitroGLYCERIN (NITROSTAT) 0.4 MG SL tablet PLACE ONE TABLET UNDER THE TONGUE EVERY 5 MINUTES AS NEEDED FOR CHEST PAIN Patient  taking differently: Place 0.4 mg under the tongue every 5 (five) minutes as needed for chest pain.  01/17/19  Yes Minus Breeding, MD  Olopatadine HCl (PATADAY) 0.2 % SOLN Place 1 drop into both eyes daily as needed (for allergies).    Yes [provider]  omeprazole (PRILOSEC) 40 MG capsule Take 1 capsule (40 mg total) by mouth daily. Patient taking differently: Take 40 mg by mouth daily as needed (for acid reflux).  05/17/18  Yes Tanner, Lyndon Code., PA-C  pyridoxine (B-6) 100 MG tablet Take 100 mg by mouth daily.   Yes [provider]  RESTASIS 0.05 % ophthalmic emulsion Place 1 drop into both eyes 2 (two) times daily as needed (dry eyes).  08/13/19  Yes [provider]  rosuvastatin (CRESTOR) 10 MG tablet Take 1 tablet (10 mg total) by mouth daily. 12/25/19 03/24/20 Yes Minus Breeding, MD  spironolactone (ALDACTONE) 25 MG tablet Take 2 tablets (50 mg total) by mouth daily. Patient taking differently: Take 25 mg by mouth daily.  07/04/19  Yes Lendon Colonel, NP  TOUJEO SOLOSTAR 300 UNIT/ML SOPN INJECT  35 UNITS INTO THE SKIN DAILY Patient taking differently: Inject 35 Units into the skin daily.  04/09/19  Yes Bartholomew Crews, MD  vitamin B-12 (CYANOCOBALAMIN) 1000 MCG tablet Take 1,000 mcg by mouth daily.   Yes [provider]  ACCU-CHEK FASTCLIX LANCETS MISC Check blood sugar 3 times a day Patient taking differently: 1 each by Other route 3 (three) times daily.  10/11/17   Bartholomew Crews, MD  Continuous Blood Gluc Receiver (FREESTYLE LIBRE 2 READER) DEVI 1 each by Does not apply route 6 (six) times daily. 11/29/19   Bartholomew Crews, MD  Continuous Blood Gluc Sensor (FREESTYLE LIBRE 2 SENSOR) MISC 1 each by Does not apply route 6 (six) times daily. 11/29/19   Bartholomew Crews, MD  glucose blood (ACCU-CHEK GUIDE) test strip USE TO CHECK BLOOD SUGARS THREE TIMES DAILY 12/10/19   Bartholomew Crews, MD  Insulin Pen Needle 32G X 4 MM MISC Use to inject insulin 4 times daily under the skin. DIAG CODE E11.319. INSULIN DEPENDENT 12/04/19   Bartholomew Crews, MD    Past Medical History: Past Medical History:  Diagnosis Date  . Arthritis    "knees, lower back" (08/18/2017)  . 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 S/p lumpectomy and radiation.  Marland Kitchen CAD (coronary artery disease) 11/07   cath 11/11 :  3V CADsee report   . Cataract    small both eyes  . Chronic lower back pain   . Chronic pain    MR 08/07/10 :  Spondylosis L4-5 with B foraminal narrowing and L4 nerve root enchroachment B.  . Chronic venous insufficiency 2012   Has had full W/U incl ECHO, LFT's, Cr, Umicro, TSH, BNP. Symptomatic treatment.  . Colonic polyp 1995   Colonoscopies 1994, 2000, and 02/02/2011. Last had 9 polyps - Tubular adenoma and tubulovillous was the pathology results without high grade dysplasia  . Diabetes mellitus type II    Insulin dependent. Has worked with [name] R previously.  . Diastolic CHF, chronic ([place])    NL EF by echo 01/2012, Gr I  dd  . Essential hypertension    Requires 5 drug therapy and still difficult to control.  Marland Kitchen GERD (gastroesophageal reflux disease)   . Heart murmur   . History of radiation therapy 10/02/18- 11/09/18   Left  chest wall SCV, Left chest wall boost, Right chest wall SCV, right chest boost all received 28 fractions for a total dose of 50.04 Gy  . Hyperlipidemia    On daily statin  . Iron deficiency anemia    Ferritin was 12 in 22-Nov-2010. on FeSo4. Has had colon and EGD 02/02/11 that showed mild gastritis and colon polyps.  . Obesity    Morbid. HAs worked with [name] T previously.  . OSA (obstructive sleep apnea) 02/16/2007   02/2007 : Moderate AHI 35.6, O2 sat decreased to 65%, CPAP titration to 16 with AHI of 3.6. 02/2014 : Rare respiratory events with sleep disturbance, within normal limits. AHI 0.4 per hour      . OSA on CPAP   . Personal history of chemotherapy   . Personal history of radiation therapy    "to my left breast"  . Refusal of blood transfusions as patient is Jehovah's Witness   . Seasonal allergies    "grass pollen"    Past Surgical History: Past Surgical History:  Procedure Laterality Date  . BREAST LUMPECTOMY Left 2004/11/22  . BREAST LUMPECTOMY W/ NEEDLE LOCALIZATION  2006   34 Chemo RX  . CARDIAC CATHETERIZATION N/A 08/15/2015   Procedure: Left Heart Cath and Coronary Angiography;  Surgeon: Troy Sine, MD;  Location: [place] [facility];  Service: Cardiovascular;  Laterality: N/A;  . COLONOSCOPY W/ BIOPSIES AND POLYPECTOMY  1994, 2000, 2012, 26/018  . CORONARY STENT INTERVENTION N/A 06/13/2019   Procedure: CORONARY STENT INTERVENTION;  Surgeon: Burnell Blanks, MD;  Location: [place] [facility];  Service: Cardiovascular;  Laterality: N/A;  . ESOPHAGOGASTRODUODENOSCOPY (EGD) WITH PROPOFOL N/A 01/09/2020   Procedure: ESOPHAGOGASTRODUODENOSCOPY (EGD) WITH PROPOFOL;  Surgeon: Gatha Mayer, MD;  Location: [place];  Service: Endoscopy;  Laterality: N/A;  on Ventilator  . IR  IMAGING GUIDED PORT INSERTION  04/17/2018  . IR REMOVAL TUN ACCESS W/ PORT W/O FL MOD SED  11/14/2018  . LEFT HEART CATH AND CORONARY ANGIOGRAPHY N/A 08/19/2017   Procedure: LEFT HEART CATH AND CORONARY ANGIOGRAPHY;  Surgeon: Belva Crome, MD;  Location: [place] [facility];  Service: Cardiovascular;  Laterality: N/A;  . LEFT HEART CATH AND CORONARY ANGIOGRAPHY N/A 06/13/2019   Procedure: LEFT HEART CATH AND CORONARY ANGIOGRAPHY;  Surgeon: Burnell Blanks, MD;  Location: [place] [facility];  Service: Cardiovascular;  Laterality: N/A;  . MASTECTOMY MODIFIED RADICAL Bilateral 07/20/2018  . MASTECTOMY MODIFIED RADICAL Bilateral 07/20/2018   Procedure: BILATERAL MODIFIED RADICAL MASTECTOMIES;  Surgeon: Jovita Kussmaul, MD;  Location: [place];  Service: General;  Laterality: Bilateral;  . PORTACATH PLACEMENT N/A 04/14/2018   Procedure: ATTEMPED INSERTION PORT-A-CATH;  Surgeon: Jovita Kussmaul, MD;  Location: [facility];  Service: General;  Laterality: N/A;  . TONSILLECTOMY AND ADENOIDECTOMY  11-22-62  . TOTAL ABDOMINAL HYSTERECTOMY  1985   "I had endometrosis"  . ULTRASOUND GUIDANCE FOR VASCULAR ACCESS  08/19/2017   Procedure: Ultrasound Guidance For Vascular Access;  Surgeon: Belva Crome, MD;  Location: [place] [facility];  Service: Cardiovascular;;    Family History: Family History  Problem Relation Age of Onset  . Lung cancer Mother   . Diabetes Father   . Breast cancer Sister   . Pulmonary embolism Brother   . Depression Daughter 23  . Osteoarthritis Maternal Grandmother   . Heart attack Brother 76  . Kidney failure Brother        died November 23, 2015 2/2 kidney and heart failure  .  Colon cancer Neg Hx   . Colon polyps Neg Hx   . Esophageal cancer Neg Hx   . Rectal cancer Neg Hx   . Stomach cancer Neg Hx     Social History: Social History   Socioeconomic History  . Marital status: Legally Separated    Spouse name: Not on file  . Number of children: Not on file  . Years of education: 68  .  Highest education level: Associate degree: academic program  Occupational History  . Not on file  Tobacco Use  . Smoking status: Never Smoker  . Smokeless tobacco: Never Used  Substance and Sexual Activity  . Alcohol use: No  . Drug use: No  . Sexual activity: Not Currently  Other Topics Concern  . Not on file  Social History Narrative   Worked at [employer] part-time from [phone], 2009-2011. No longer working 2015.Associates degree in early childhood education. Single, lives by self.   Social Determinants of Health   Financial Resource Strain: Low Risk   . Difficulty of Paying Living Expenses: Not hard at all  Food Insecurity: No Food Insecurity  . Worried About Charity fundraiser in the Last Year: Never true  . Ran Out of Food in the Last Year: Never true  Transportation Needs:   . Lack of Transportation (Medical):   Marland Kitchen Lack of Transportation (Non-Medical):   Physical Activity: Unknown  . Days of Exercise per Week: 2 days  . Minutes of Exercise per Session: Not asked  Stress:   . Feeling of Stress :   Social Connections:   . Frequency of Communication with Friends and Family:   . Frequency of Social Gatherings with Friends and Family:   . Attends Religious Services:   . Active Member of Clubs or Organizations:   . Attends Archivist Meetings:   Marland Kitchen Marital Status:     Allergies:  Allergies  Allergen Reactions  . Blood-Group Specific Substance Other (See Comments)    NO BLOOD - JEHOVAH'S WITNESS  . Lisinopril Other (See Comments)    Chronic cough secondary to ACE  . Oxycodone-Acetaminophen Hives, Itching and Other (See Comments)    flushing  . Percocet [Oxycodone-Acetaminophen] Hives  . Victoza [Liraglutide] Other (See Comments)    Made her feel "out of it"  . Atorvastatin Other (See Comments)    myalgias    Objective:    Vital Signs:   Temp:  [97.5 F (36.4 C)-99 F (37.2 C)] 99 F (37.2 C) (04/06 1100) Pulse Rate:  [116-133] 116  (04/06 1500) Resp:  [15-38] 15 (04/06 1500) BP: (87-111)/(36-75) 87/45 (04/06 1511) SpO2:  [67 %-100 %] 100 % (04/06 1511) Arterial Line BP: (88-115)/(45-57) 88/45 (04/06 1500) FiO2 (%):  [50 %-100 %] 50 % (04/06 1511) Weight:  [87.6 kg] 87.6 kg (04/06 0148) Last BM Date: 01/27/2020  Weight change: Filed Weights   01/06/20 0526 01/07/20 0500 01-27-2020 0148  Weight: 92.5 kg 90.1 kg 87.6 kg    Intake/Output:   Intake/Output Summary (Last 24 hours) at 2020/01/27 1531 Last data filed at 01/27/2020 1500 Gross per 24 hour  Intake 836.99 ml  Output 335 ml  Net 501.99 ml      Physical Exam    General:  Critically ill AAF wake on vent. No resp difficulty HEENT: normal + ETT Neck: supple. Elevated JVP. Carotids 2+ bilat; no bruits. No lymphadenopathy or thyromegaly appreciated. Cor: PMI nondisplaced. Regular rhythm tachy rate. No rubs, gallops or murmurs. Lungs: intubated, clear Abdomen:  soft, nontender, nondistended. No hepatosplenomegaly. No bruits or masses. Good bowel sounds. Extremities: no cyanosis, clubbing, rash, edema, bilateral SCDs Neuro: awake on vent, following commands    Telemetry   Sinus tach 110s   Labs   Basic Metabolic Panel: Recent Labs  Lab 01/07/2020 0017 01/19/2020 0017 01/21/2020 0445 01/30/2020 0445 01/05/20 0442 01/05/20 0442 01/06/20 0504 01/06/20 0504 01/06/20 1205 01/06/20 1227 01/07/20 0435 2020-01-25 0908 2020-01-25 0918  NA 141   < > 141   < > 146*   < > 143   < > 144 140 145 150* 147*  K 3.7   < > 3.7   < > 3.8   < > 3.5   < > 4.2 4.1 3.7 4.0 3.9  CL 102   < > 103   < > 107  --  105  --  105  --  106 108  --   CO2 28   < > 28   < > 27  --  18*  --  16*  --  21* 10*  --   GLUCOSE 273*   < > 238*   < > 264*  --  197*  --  206*  --  211* 183*  --   BUN 34*   < > 35*   < > 41*  --  43*  --  54*  --  53* 81*  --   CREATININE 1.15*   < > 1.22*   < > 1.21*  --  1.72*  --  1.99*  --  1.80* 3.15*  --   CALCIUM 8.8*   < > 8.9   < > 8.8*   < > 8.7*   < >  8.7*  --  8.9 8.5*  --   MG 2.1  --  2.2  --  2.3  --   --   --   --   --  2.5* 4.1*  --   PHOS  --   --  2.9  --   --   --   --   --   --   --   --   --   --    < > = values in this interval not displayed.    Liver Function Tests: Recent Labs  Lab 01/02/20 0446 01/25/2020 0445 01/06/20 1205 01-25-2020 0908  AST 212* 45* 804* 900*  ALT 166* 97* 609* 863*  ALKPHOS 61 58 67 102  BILITOT 0.2* 0.3 1.1 1.7*  PROT 6.8 6.5 6.7 6.4*  ALBUMIN 3.0* 2.9* 3.0* 2.9*   No results for input(s): LIPASE, AMYLASE in the last 168 hours. No results for input(s): AMMONIA in the last 168 hours.  CBC: Recent Labs  Lab 01/28/2020 0445 01/05/2020 0445 01/18/2020 1400 01/22/2020 1400 01/05/20 0442 01/06/20 0812 01/06/20 1227 January 25, 2020 0500 25-Jan-2020 0918  WBC 8.3  --  9.5  --  8.2 15.7*  --  19.5*  --   NEUTROABS  --   --   --   --   --   --   --  14.9*  --   HGB 8.6*   < > 8.9*   < > 8.5* 10.6* 9.5* 9.8* 9.5*  HCT 27.8*   < > 28.6*   < > 27.2* 36.0 28.0* 33.3* 28.0*  MCV 94.2  --  94.1  --  95.1 99.7  --  99.1  --   PLT 170  --  150  --  133* 145*  --  91*  --    < > = values in this interval not displayed.    Cardiac Enzymes: No results for input(s): CKTOTAL, CKMB, CKMBINDEX, TROPONINI in the last 168 hours.  BNP: BNP (last 3 results) Recent Labs    06/10/19 2036 12/06/2019 0753 02/01/2020 0446  BNP 250.1* 1,562.2* 2,155.8*    ProBNP (last 3 results) No results for input(s): PROBNP in the last 8760 hours.   CBG: Recent Labs  Lab 01/07/20 2014 01/07/20 2336 01/16/2020 0332 Jan 16, 2020 0805 2020/01/16 1139  GLUCAP 241* 216* 162* 158* 115*    Coagulation Studies: No results for input(s): LABPROT, INR in the last 72 hours.   Imaging   DG Chest Port 1 View  Result Date: 16-Jan-2020 CLINICAL DATA:  Check endotracheal tube placement EXAM: PORTABLE CHEST 1 VIEW COMPARISON:  01/07/2020 FINDINGS: Endotracheal tube is seen 2.4 cm above the carina. Gastric catheter extends into the stomach. Cardiac  shadow is stable. Patchy bibasilar atelectasis is noted. No acute bony abnormality is seen. IMPRESSION: Patchy bibasilar atelectasis. Tubes and lines in satisfactory position. Electronically Signed   By: Inez Catalina M.D.   On: 2020/01/16 10:22   DG CHEST PORT 1 VIEW  Result Date: 01/07/2020 CLINICAL DATA:  Right IJ placement aborted EXAM: PORTABLE CHEST 1 VIEW COMPARISON:  01/06/2020 FINDINGS: There is bibasilar airspace disease. There are bilateral small pleural effusions. There is no pneumothorax. The heart mediastinum are stable. There is no acute osseous abnormality. IMPRESSION: Bilateral patchy airspace disease concerning for pneumonia. Electronically Signed   By: Kathreen Devoid   On: 01/07/2020 17:55      Medications:     Current Medications: . aspirin EC  81 mg Oral Daily  . chlorhexidine gluconate (MEDLINE KIT)  15 mL Mouth Rinse BID  . Chlorhexidine Gluconate Cloth  6 each Topical Q0600  . furosemide  80 mg Intravenous BID  . insulin aspart  0-15 Units Subcutaneous Q4H  . mouth rinse  15 mL Mouth Rinse 10 times per day  . pantoprazole (PROTONIX) IV  40 mg Intravenous Q12H  . pneumococcal 23 valent vaccine  0.5 mL Intramuscular Tomorrow-1000  . polyethylene glycol  17 g Oral BID  . senna  1 tablet Oral BID  . sodium chloride flush  10-40 mL Intracatheter Q12H     Infusions: . sodium chloride Stopped (01/05/20 2311)  . sodium chloride 33.3 mL/hr at 01/16/20 1240  . amiodarone 60 mg/hr (2020/01/16 1500)  . amiodarone    . ceFEPime (MAXIPIME) IV Stopped (01-16-2020 1049)  . fentaNYL infusion INTRAVENOUS 50 mcg/hr (2020/01/16 1500)  . milrinone Stopped (01-16-20 0825)  . norepinephrine (LEVOPHED) Adult infusion 40 mcg/min (2020/01/16 1500)  . vasopressin (PITRESSIN) infusion - *FOR SHOCK* 0.04 Units/min (16-Jan-2020 1525)      Assessment/Plan   1. Cardiogenic Shock S/P VT/VF Arrest 4/6 - EF now 25-30% (previously 45-50% 06/2019), in the setting of suspected ACS - s/p resuscitation.  Now intubated and awake on vent w/ high pressor requirements  - now off milrinone given VT/VF - continue IV amiodarone. Keep K >4.0 and Mg >2.0 - will continue vent + pressor support and observe over the next 24-48 hs. Continue NE + VP. Target MAP >65. Follow Lactic acid for clearance  - she now has Central line access. Will follow co-ox - if she makes progress w/ improvement in SCr, will plan LHC to further evaluate - if no significant improvement, will shift focus to palliative/ comfort care - She is currently DNR  2. CAD -  known 3V CAD. Turned down for CABG in 2018 due to refusal to accept blood products - NSTEMI 06/2019. LHC showed severe stenosis in proximal LAD treated w/ PTCA/DES x 1 proximal LAD, severe stenosis in the first obtuse marginal branch with moderately severe disease in the circumflex, treated w/ balloon angioplasty only of the obtuse marginal branch and moderate disease in the small non-dominant RCA (medical therapy). - Now w/ suspected ACS given troponin elevation and worsening HF.  - Awake on vent and denies active CP. No plans for immediate [facility] given AKI.  - If Scr improves, plan repeat LHC  - Continue ASA. No statin due to shock liver   3. ? PNA - admit chest CT showed bilateral diffuse infiltrates - Covid negative - on cefepime.  - continues w/ high pressor support on NE + VP, suspect primarily due to cardiogenic shock, but ? Potential component of septic shock.  WBC up to 19K. Check PCT  4. Acute AKI  - SCr 1.4 on admit, now up to 3.1, in setting of CGS - will hopefully improve w/ pressor support - follow BMP   5. Shock Liver - AST 900 - ALT 863 - continue pressor support and follow   Medication concerns reviewed with patient and pharmacy team. Barriers identified:  Length of Stay: [address], PA-C  28-Jan-2020, 3:31 PM  Advanced Heart Failure Team Pager [phone] (M-F; 7a - 4p)  Please contact [facility] for night-coverage after hours (4p  -7a ) and weekends on [email or web address]  Agree with above.  69 y/o woman with h/o severe 3v CAD, breast CA, DM2 whom we are asked to see by Dr. Radford Pax due to cardiogenic shock.   Has known 3vCAD turned down for CABG in 2018 due to unwillingness to accept blood products (Jehovah's Witness). In 2019 had stenting of 99%p LAD and POBA of large branching OM.   Now admitted with several days of CP/NSTEMI.   Initially improved with medical care despite echo with EF 25-30% but then developed progressive HF. Started on inotropes and developed VF arrest. Was resuscitated but developed MSOF  Now intubated but awake. On NE 40. BP marginal. Cr 3.1 + shock liver  On exam  General:  Intubated awake and following commands HEENT: normal Neck: supple. JVP to jawCarotids 2+ bilat; no bruits. No lymphadenopathy or thryomegaly appreciated. Cor: PMI nondisplaced. Irregular rate & rhythm. +s3 Lungs: coarse Abdomen: soft, nontender, nondistended. No hepatosplenomegaly. No bruits or masses. Good bowel sounds. Extremities: no cyanosis, clubbing, rash, tr edema Neuro: awake on vent following commands  She is critically ill with cardiogenic shock and MSOF in setting of OOH MI. Now intubated and relatively stabilized on NE 40.   I spoke to her and her family at length about her wishes and their primary concern is that she not suffer. Will continue current level of care without escalation and see if she can recover. If deteriorates or becomes uncomfortable would want to switch to comfort care. She is DNR/DNI. She cannot get blood products.   No role for cath with OOH MI and AKI.   D/w CCM team   CRITICAL CARE Performed by: Glori Bickers  Total critical care time: 45 minutes  Critical care time was exclusive of separately billable procedures and treating other patients.  Critical care was necessary to treat or prevent imminent or life-threatening deterioration.  Critical care was time spent personally by me  (independent of midlevel providers or residents) on the following activities: development of treatment  plan with patient and/or surrogate as well as nursing, discussions with consultants, evaluation of patient's response to treatment, examination of patient, obtaining history from patient or surrogate, ordering and performing treatments and interventions, ordering and review of laboratory studies, ordering and review of radiographic studies, pulse oximetry and re-evaluation of patient's condition.  Glori Bickers, MD  5:43 PM

## 2020-02-02 NOTE — Procedures (Signed)
Arterial Catheter Insertion Procedure Note JOHNENE GRANADE DI:[id number] 11-16-1950  Procedure: Insertion of Arterial Catheter  Indications: Blood pressure monitoring and Frequent blood sampling  Procedure Details Consent: Unable to obtain consent because of emergent medical necessity. Time Out: Verified patient identification, verified procedure, site/side was marked, verified correct patient position, special equipment/implants available, medications/allergies/relevent history reviewed, required imaging and test results available.  Performed  Maximum sterile technique was used including antiseptics, cap, gloves, gown, hand hygiene, mask and sheet. Skin prep: Chlorhexidine; local anesthetic administered 20 gauge catheter was inserted into right femoral artery using the Seldinger technique.  Evaluation Blood flow good; BP tracing good. Complications: No apparent complications.   Richardson Landry Alyria Krack ACNP Maryanna Shape PCCM Pager [phone] till 3 pm If no answer page [phone] 2020-02-02, 7:40 AM

## 2020-02-02 DEATH — deceased

## 2020-10-21 ENCOUNTER — Ambulatory Visit: Payer: Medicare Other | Admitting: Hematology and Oncology

## 2021-12-26 ENCOUNTER — Emergency Department: Admit: 2021-12-26 | Payer: MEDICARE

## 2021-12-26 ENCOUNTER — Inpatient Hospital Stay
Admit: 2021-12-26 | Discharge: 2021-12-27 | Disposition: A | Discharge status: Skilled Nursing Facility | Payer: MEDICARE

## 2021-12-26 DIAGNOSIS — R140 Abdominal distension (gaseous): Secondary | ICD-10-CM

## 2021-12-26 LAB — HEPATIC FUNCTION PANEL
ALT: 19 U/L (ref 7–52)
AST: 27 U/L (ref 13–39)
Albumin: 3.4 g/dL (ref 3.5–5.7)
Alkaline Phosphatase: 67 U/L (ref 36–125)
Bilirubin, Direct: 0.1 mg/dL (ref 0.0–0.4)
Bilirubin, Indirect: 0.4 mg/dL (ref 0.0–1.1)
Total Bilirubin: 0.5 mg/dL (ref 0.0–1.5)
Total Protein: 7.3 g/dL (ref 6.4–8.9)

## 2021-12-26 LAB — URINALYSIS W/RFL TO MICROSCOPIC
Bilirubin, UA: NEGATIVE
Glucose, UA: NEGATIVE mg/dL
Ketones, UA: NEGATIVE mg/dL
Nitrite, UA: NEGATIVE
Protein, UA: 30 mg/dL
RBC, UA: >100 /HPF (ref 0–3)
Specific Gravity, UA: 1.015 (ref 1.005–1.035)
Urobilinogen, UA: <2 mg/dL (ref 0.2–1.9)
WBC, UA: 21 /HPF (ref 0–5)
pH, UA: 6 (ref 5.0–8.0)

## 2021-12-26 LAB — VENOUS BLOOD GAS, LINE/SYRINGE
%HBO2-Line Draw: 69.3 % (ref 40.0–70.0)
Base Excess-Line Draw: 8.7 mmol/L (ref <=3.0)
CO2 Content-Line Draw: 36 mmol/L (ref 25–29)
Carboxyhgb-Line Draw: 1.8 %
HCO3-Line Draw: 35 mmol/L (ref 24–28)
Methemoglobin-Line Draw: 0.5 % (ref 0.0–1.5)
PCO2-Line Draw: 52 mm Hg (ref 41–51)
PH-Line Draw: 7.43 (ref 7.32–7.42)
PO2-Line Draw: 39 mm Hg (ref 25–40)
Reduced Hemoglobin-Line Draw: 28.4 % (ref 0.0–5.0)

## 2021-12-26 LAB — DIFFERENTIAL
Basophils Absolute: 14 /uL (ref 0–200)
Basophils Relative: 0.1 % (ref 0.0–1.0)
Eosinophils Absolute: 0 /uL (ref 15–500)
Eosinophils Relative: 0 % (ref 0.0–8.0)
Lymphocytes Absolute: 511 /uL (ref 850–3900)
Lymphocytes Relative: 3.7 % (ref 15.0–45.0)
Monocytes Absolute: 925 /uL (ref 200–950)
Monocytes Relative: 6.7 % (ref 0.0–12.0)
Neutrophils Absolute: 12351 /uL (ref 1500–7800)
Neutrophils Relative: 89.5 % (ref 40.0–80.0)
nRBC: 0 /100 WBC (ref 0–0)

## 2021-12-26 LAB — BASIC METABOLIC PANEL
Anion Gap: 8 mmol/L (ref 3–16)
BUN: 46 mg/dL (ref 7–25)
CO2: 31 mmol/L (ref 21–33)
Calcium: 9.9 mg/dL (ref 8.6–10.3)
Chloride: 96 mmol/L (ref 98–110)
Creatinine: 0.78 mg/dL (ref 0.60–1.30)
EGFR: 82
Glucose: 147 mg/dL (ref 70–100)
Osmolality, Calculated: 295 mOsm/kg (ref 278–305)
Potassium: 5.1 mmol/L (ref 3.5–5.3)
Sodium: 135 mmol/L (ref 133–146)

## 2021-12-26 LAB — CBC
Hematocrit: 39.2 % (ref 35.0–45.0)
Hemoglobin: 13.2 g/dL (ref 11.7–15.5)
MCH: 35.3 pg (ref 27.0–33.0)
MCHC: 33.6 g/dL (ref 32.0–36.0)
MCV: 105.1 fL (ref 80.0–100.0)
MPV: 7.5 fL (ref 7.5–11.5)
Platelets: 347 10*3/uL (ref 140–400)
RBC: 3.73 10*6/uL (ref 3.80–5.10)
RDW: 12.7 % (ref 11.0–15.0)
WBC: 13.8 10*3/uL (ref 3.8–10.8)

## 2021-12-26 LAB — LIPASE: Lipase: 33 U/L (ref 4–82)

## 2021-12-26 LAB — URINE CULTURE: Culture Result: <1000

## 2021-12-26 LAB — LACTIC ACID: Lactate: 1.6 mmol/L (ref 0.5–2.2)

## 2021-12-26 LAB — POC GLU MONITORING DEVICE: POC Glucose Monitoring Device: 132 mg/dL (ref 70–100)

## 2021-12-26 LAB — MAGNESIUM: Magnesium: 2.6 mg/dL (ref 1.5–2.5)

## 2021-12-26 MED ORDER — electrolyte-R (pH 7.4) (NORMOSOL-R pH 7.4) 1,000 mL IV fluid
Freq: Once | INTRAVENOUS | Status: AC
Start: 2021-12-26 — End: 2021-12-26
  Administered 2021-12-26: 18:00:00 1000 mL via INTRAVENOUS

## 2021-12-26 MED ORDER — ketorolac (TORADOL) injection 15 mg
15 | Freq: Once | INTRAMUSCULAR | Status: AC
Start: 2021-12-26 — End: 2021-12-26
  Administered 2021-12-26: 23:00:00 15 mg via INTRAVENOUS

## 2021-12-26 MED ORDER — cefTRIAXone (ROCEPHIN) 1 g in sterile water (PF) 10 mL IV Push
1 | Freq: Once | INTRAMUSCULAR | Status: AC
Start: 2021-12-26 — End: 2021-12-26
  Administered 2021-12-26: 18:00:00 1 g via INTRAVENOUS

## 2021-12-26 MED ORDER — cephALEXin (KEFLEX) 500 MG capsule
500 | ORAL_CAPSULE | Freq: Four times a day (QID) | ORAL | 0 refills | Status: AC
Start: 2021-12-26 — End: 2022-01-06

## 2021-12-26 MED ORDER — OMNIPAQUE (iohexol) 350 mg iodine/mL 100 mL
350 | Freq: Once | INTRAVENOUS | Status: AC | PRN
Start: 2021-12-26 — End: 2021-12-26
  Administered 2021-12-26: 18:00:00 100 mL via INTRAVENOUS

## 2021-12-26 MED ORDER — S.M.O.G. enema (RECTAL USE ONLY)
Freq: Once | Status: AC
Start: 2021-12-26 — End: 2021-12-26

## 2021-12-26 MED ORDER — sodium chloride 0.9 % infusion
INTRAVENOUS | Status: AC
Start: 2021-12-26 — End: 2021-12-26
  Administered 2021-12-26: 18:00:00

## 2021-12-26 MED FILL — CEFTRIAXONE 1 GRAM SOLUTION FOR INJECTION: 1 1 gram | INTRAMUSCULAR | Qty: 1

## 2021-12-26 MED FILL — KETOROLAC 15 MG/ML INJECTION SOLUTION: 15 15 mg/mL | INTRAMUSCULAR | Qty: 1

## 2021-12-26 MED FILL — SODIUM CHLORIDE 0.9 % INTRAVENOUS SOLUTION: INTRAVENOUS | Qty: 50

## 2021-12-26 MED FILL — NORMOSOL-R PH 7.4 INTRAVENOUS SOLUTION: 1000.00 1000.00 mL | INTRAVENOUS | Qty: 1000

## 2021-12-26 MED FILL — S.M.O.G. ENEMA 400 ML: 400.00 400.00 mL | Qty: 400

## 2021-12-26 NOTE — Unmapped (Signed)
Bed: C24W  Expected date:   Expected time:   Means of arrival: [facility])  Comments:  [place]/ 71 yo f, c/o abd distention, gen illness

## 2021-12-26 NOTE — Unmapped (Signed)
Patient report and care given to [facility] crew. All paperwork and discharge paperwork given to transport crew.

## 2021-12-26 NOTE — Unmapped (Addendum)
You were seen in the emergency department for   Chief Complaint   Patient presents with    Abdominal Distention   .  Your labs and/or imaging were urgently unremarkable.  Under CT imaging, there is evidence of adrenal mass.  Please discuss these findings with your primary care physician for further evaluation work-up including additional imaging as indicated outpatient.    I recommend that you follow up with your primary care physician in the next 3 days for further evaluation and management.    Also provided you with a prescription for Keflex for urinary tract infection.  Please take this as prescribed.  Urine cultures have been sent and you will be notified if this antibiotic needs to be changed for any reason.      Please return to the emergency department if you develop a fever, have new or worsening symptoms including headache, abdominal pain, nausea, vomiting, diarrhea, chest pain or chest tightness, feel as if you  are going to pass out / pass out, or have any other new or concerning symptoms.     No future appointments.

## 2021-12-26 NOTE — Unmapped (Signed)
Report called to [facility]

## 2021-12-26 NOTE — Unmapped (Signed)
ED Attending Attestation Note    Date of service:  12/26/2021    This patient was seen by the resident physician.  I have seen and examined the patient, agree with the workup, evaluation, management and diagnosis. The care plan has been discussed and I concur.  I have reviewed the ECG and concur with the resident's interpretation.    My assessment reveals a 71 y.o. female presented to the emergency department for evaluation of abdominal pain with distention.  The patient was having a lower abdominal pain discomfort with associated distention concerning for bowel obstruction.  The patient has history of cachexia, dysphagia and is status post intracranial hemorrhage in 2015.  She currently is in a long-term extended-care facility presents for evaluation and assessment for abdominal pain

## 2021-12-26 NOTE — Unmapped (Signed)
[facility] ED Note    Date of Service: 12/26/2021  Reason for Visit: Abdominal Distention      Patient History     HPI    Nursing triage note reviewed      Michelle Ponce is a 71 y.o. female with a past medical history of intracranial hemorrhage, malnutrition with G-tube in place, further history as noted who is presenting to the emergency department today with abdominal distention.  Per report, abdominal distention has been ongoing for the last couple of days but severely worsened today.  Patient is unable to provide much history but per EMS the patient was in no significant acute distress.  Patient hemodynamically stable and afebrile.  Husband is on the way.    In speaking with patient's family who is at bedside, the patient was recently admitted for a femur fracture which was operatively repaired.  9 days ago, the patient had a G-tube placed.  Patient has been having irregular bowel movements, first bowel movement about 5 to 6 days after her operation.  However, she has been constipated since.  Unclear whether or not patient has been passing gas.    Other than stated above, no additional aggravating or alleviating factors are noted.    Past Medical History:   Diagnosis Date   ??? Hypercholesteremia    ??? Hypertension    ??? Stroke (CMS-HCC)      History reviewed. No pertinent surgical history.  Patient  reports that she has never smoked. She has never used smokeless tobacco. She reports that she does not currently use alcohol. She reports that she does not use drugs.  Previous Medications    ATORVASTATIN (LIPITOR) 10 MG TABLET    1 tablet (10 mg total) by Per G Tube route daily.    ENOXAPARIN (LOVENOX) 40 MG/0.4 ML SYRG    Inject 0.4 mLs (40 mg total) subcutaneously daily.    LORATADINE (CLARITIN) 10 MG TABLET    1 tablet (10 mg total) by Per G Tube route daily.    PANTOPRAZOLE (PROTONIX) 20 MG TABLET    1 tablet (20 mg total) every morning before breakfast.    TRAMADOL (ULTRAM) 50 MG TABLET    1 tablet (50 mg total) by  Per G Tube route every 6 hours as needed for Pain.       Fhx:   No family history on file.     Allergies:   Allergies as of 12/26/2021 - Fully Reviewed 12/26/2021   Allergen Reaction Noted   ??? Aspirin  12/26/2021   ??? Atenolol  12/26/2021   ??? Codeine  12/26/2021   ??? Methylprednisone  12/26/2021   ??? Sudafed [pseudoephedrine hcl]  12/26/2021       All nursing notes and triage notes were appropriately reviewed in the course of the creation of this note.     Review of Systems     ROS    Positive for abdominal distention, otherwise unable to obtain significant review of systems secondary to the patient's baseline mental status    Physical Exam     Vitals:    12/26/21 1430 12/26/21 1500 12/26/21 1530 12/26/21 1620   BP: 110/78 143/85 139/86    BP Location:       Patient Position:       Pulse: 112 99 103    Resp:       Temp:    97.7 ??F (36.5 ??C)   TempSrc:    Axillary   SpO2: 97% 98% 98%  Weight:       Height:         General:   Cachectic and emaciated  Head: Atraumatic   Eyes:  Pupils reactive. No discharge from eyes. No conjuntival redness or scleral icterus.   ENT:  MMM. No discharge from nose. OP clear without exudate.   Pulmonary:   Non-labored breathing. Breath sounds clear bilaterally without rales, wheezes, or ronchi.  Cardiac:  Regular rate and rhythm. No murmurs, rubs, or gallops.    Abdomen:   Abdomen has diffuse distention with diffuse abdominal tenderness.  No rebound tenderness, no guarding.  G-tube is in place with minimal surrounding erythema consistent with likely localized skin irritation.  No fluctuance or induration  Musculoskeletal:  No long bone deformity.   Vascular:  Extremities warm and perfused.   Skin:  Dry, no rashes, no jaundice. No spider angiomata/palmar erythema.   Extremities:  No peripheral edema  Neuro:  Alert. Moves all four extremities to command. No focal deficit  Psych: Patient is alert, intermittently interactive  Diagnostic Studies     Labs:  Labs Reviewed   BASIC METABOLIC PANEL -  Abnormal; Notable for the following components:       Result Value    Chloride 96 (*)     BUN 46 (*)     Glucose 147 (*)     All other components within normal limits   CBC - Abnormal; Notable for the following components:    WBC 13.8 (*)     RBC 3.73 (*)     MCV 105.1 (*)     MCH 35.3 (*)     All other components within normal limits   DIFFERENTIAL - Abnormal; Notable for the following components:    Neutrophils Relative 89.5 (*)     Lymphocytes Relative 3.7 (*)     Neutrophils Absolute 12,351 (*)     Lymphocytes Absolute 511 (*)     Eosinophils Absolute 0 (*)     All other components within normal limits   HEPATIC FUNCTION PANEL - Abnormal; Notable for the following components:    Albumin 3.4 (*)     All other components within normal limits   MAGNESIUM - Abnormal; Notable for the following components:    Magnesium 2.6 (*)     All other components within normal limits   VENOUS BLOOD GAS, LINE/SYRINGE - Abnormal; Notable for the following components:    PH-Line Draw 7.43 (*)     PCO2-Line Draw 52 (*)     HCO3-Line Draw 35 (*)     CO2 Content-Line Draw 36 (*)     Base Excess-Line Draw 8.7 (*)     Reduced Hemoglobin-Line Draw 28.4 (*)     All other components within normal limits   URINALYSIS W/RFL TO MICROSCOPIC - Abnormal; Notable for the following components:    Color, UA Amber (*)     Clarity, UA Cloudy (*)     Protein, UA 30 (*)     Blood, UA Large (*)     Leukocytes, UA Trace (*)     RBC, UA >100 (*)     WBC, UA 21 (*)     Bacteria, UA Moderate (*)     All other components within normal limits   POC GLU MONITORING DEVICE - Abnormal; Notable for the following components:    POC Glucose Monitoring Device 132 (*)     All other components within normal limits   LACTIC ACID   LIPASE  Radiology:  CT Abdomen and Pelvis With IV contrast   Final Result   IMPRESSION:      1.  No acute findings in the abdomen or pelvis.   2.  Soft tissue edema and gas surrounding the left hip, likely related to recent  hemiarthroplasty.    3.  Incidental 1.3 cm right adrenal nodule, likely an adenoma. Consider comparison with prior imaging or 12 month follow up as clinically indicated.    4.  Calcified atherosclerosis causing mild to moderate stenosis of the bilateral renal arteries and SMA ostium.      I have personally reviewed the images and I agree with this report.      Report Verified by: William Hamburger, MD at 12/26/2021 3:45 PM EDT      X-ray Abdomen AP view   Final Result   IMPRESSION:       Nonspecific bowel gas pattern with moderate colonic stool burden.         Report Verified by: William Hamburger, MD at 12/26/2021 1:41 PM EDT          EKG:    None      Emergency Department Procedures   None    ED Course and MDM       Wenda Vanschaick is a 71 y.o. female with a history and presentation as described above in HPI.  The patient was evaluated by myself and the ED Attending Physician, Dr. Daleen Squibb. All management and disposition plans were discussed and agreed upon.      ED Course as of 12/26/21 1639   Sat Dec 26, 2021   1238 Patient is a unwell, cachectic, emaciated female presenting to the emergency department today from nursing facility due to concern for abdominal distention.  Per report, the patient has had abdominal distention and pain worsening over the last 2 days.  On physical examination, the patient has significant, diffuse abdominal distention.  She has no rebound tenderness or guarding, however she does have diffuse tenderness palpation on examination.  Patient is afebrile and hemodynamically stable with very mild tachycardia to 105.  G-tube is in place without evidence of significant underlying or surrounding infection.  History and presentation is concerning for ileus/SBO/bowel obstruction versus acute intra-abdominal infectious pathology or perforation.  As result, will obtain x-ray imaging followed by CT imaging of the abdomen pelvis with IV contrast.  Broad medical work-up has been initiated.  VBG reveals a metabolic  alkalosis.   1254 Laboratory evaluation thus far reveals a leukocytosis at 13.8.  No anemia.  Basic metabolic panel is notable for an elevation in her BUN without elevation in creatinine.  No AKI or significant electrolyte abnormalities.  Ultimate etiology of patient's elevated BUN is unknown however I am concerned for possible perforated viscus versus possible GI bleed in association with her significant abdominal distention.  Will perform bedside rectal examination though patient is hemodynamically stable with normal hemoglobin, decreasing suspicion for acute upper or lower GI bleed that is brisk at this time.  LFTs are unremarkable.  Lactate within normal limits.  Lipase is within normal limits.  Decrease suspicion for pancreatitis.   1337 On digital rectal examination, the patient does appear to have some stool.  However, unable to disimpact at this time.  There is no blood.  Further, Foley catheter was placed for evidence of significant bladder distention on bedside x-ray.  A significant amount of dark urine/bloody urine has been retrieved.  We will send for testing for urinary tract infection.  CT is pending at this time.  Will consider enema pending findings on CT.   1355 X-ray Abdomen AP view  IMPRESSION:   ??   Nonspecific bowel gas pattern with moderate colonic stool burden.    1356 Urinalysis w/Rfl to Microscopic(!):    Color, UA Amber(!)   Clarity, UA Cloudy(!)   Specific Gravity, UA 1.015   pH, UA 6.0   Protein, UA 30(!)   Glucose, UA Negative   Ketones, UA Negative   Bilirubin, UA Negative   Blood, UA Large(!)   Nitrite, UA Negative   Urobilinogen, UA <2.0   Leukocytes, UA Trace(!)   RBC, UA >100(!)   WBC, UA 21(!)   Bacteria, UA Moderate(!)  Urine culture has been sent.  CT of the abdomen pelvis is pending at this time to evaluate for possible stone/infected stone.  Patient noted to have significant urinary retention.  Foley has been placed with a significant amount of output.  X-ray without evidence  of significant SBO however there is moderate stool burden.   1357 We will give patient a liter of fluids as well as ceftriaxone for treatment of suspected urinary tract infection and possible dehydration.   1611 CT Abdomen and Pelvis With IV contrast  IMPRESSION:     1. ??No acute findings in the abdomen or pelvis.   2. ??Soft tissue edema and gas surrounding the left hip, likely related to recent hemiarthroplasty.   3. ??Incidental 1.3 cm right adrenal nodule, likely an adenoma. Consider comparison with prior imaging or 12 month follow up as clinically indicated.   4. ??Calcified atherosclerosis causing mild to moderate stenosis of the bilateral renal arteries and SMA ostium.    1614 CT of the abdomen pelvis was ultimately unremarkable without evidence of SBO or perforated viscus.  Significant abdominal distention may well have been related to her significant urinary retention, resolved and relieved with Foley catheter placement.  Patient is noted to have constipation therefore will administer smog enema.  In the setting of reassuring physical examination, imaging, and laboratory evaluation, I do not believe patient has an indication for admission to the hospital at this time nor does she require surgical consultation.  Patient likely does have a urinary tract infection.  Urine culture has been sent and patient has been given her first dose of ceftriaxone here.  Patient will be discharged with Keflex with instructed to follow-up with her primary care physician closely within the next 3 days.  Patient will be discharged back to her nursing facility where she will be in the monitored setting.  Further, I have informed family of her CT exam findings and recommended that she follow-up with the physician to have further imaging as needed.       Medical Decision Making  Abdominal distension: acute illness or injury  Amount and/or Complexity of Data Reviewed  Labs: ordered. Decision-making details documented in ED  Course.  Radiology: ordered. Decision-making details documented in ED Course.      Risk  Prescription drug management.              Medications received during this ED visit:  Medications   S.M.O.G. enema (RECTAL USE ONLY) (has no administration in time range)   OMNIPAQUE (iohexol) 350 mg iodine/mL 100 mL (100 mLs Intravenous Given 12/26/21 1400)   sodium chloride 0.9 % infusion (0 mL/hr  Stopped 12/26/21 1401)   cefTRIAXone (ROCEPHIN) 1 g in sterile water (PF) 10 mL IV Push (1 g Intravenous Given 12/26/21 1425)  electrolyte-R (pH 7.4) (NORMOSOL-R pH 7.4) 1,000 mL IV fluid (0 mLs Intravenous Stopped 12/26/21 1620)         Impression     1. Abdominal distension    2. UTI (urinary tract infection), uncomplicated         Plan     I do not believe that this patient requires any further work-up or inpatient management for their complaints, and are appropriate for outpatient follow-up.  I discussed the findings of my physical exam and work-up with the patient, and they were given the opportunity to ask questions.  The patient is agreeable with the plan and is ready to be discharged.    The patient was discharged  with prescriptions for :  New Prescriptions    CEPHALEXIN (KEFLEX) 500 MG CAPSULE    Take 1 capsule (500 mg total) by mouth 4 times daily before meals and at bedtime for 7 days.   .   Patient instructed to follow-up with PCP as well as urology, and given strict return precautions. All of patient's questions were answered satisfactorily, ambulating without assistance with stable vital signs and she was subsequently sent home in stable condition.        Derrek Gu, MD, PGY-3  [facility]      Please note that this note was dictated using voice-recognition software, which occasionally leads to inadvertent typographic errors       Critical Care Time (Attendings)        Derrek Gu, MD  Resident  12/26/21 [phone]

## 2021-12-26 NOTE — Unmapped (Signed)
Patient from [facility]. Presents with abdominal distention x a couple days.

## 2022-01-03 ENCOUNTER — Observation Stay: Admit: 2022-01-03 | Payer: MEDICARE

## 2022-01-03 ENCOUNTER — Emergency Department: Admit: 2022-01-03 | Payer: MEDICARE

## 2022-01-03 ENCOUNTER — Inpatient Hospital Stay
Admission: EM | Admit: 2022-01-03 | Discharge: 2022-01-06 | Disposition: A | Discharge status: Skilled Nursing Facility | Payer: MEDICARE

## 2022-01-03 DIAGNOSIS — J690 Pneumonitis due to inhalation of food and vomit: Secondary | ICD-10-CM

## 2022-01-03 LAB — DIFFERENTIAL
Basophils Absolute: 10 /uL (ref 0–200)
Basophils Relative: 0.1 % (ref 0.0–1.0)
Eosinophils Absolute: 39 /uL (ref 15–500)
Eosinophils Relative: 0.4 % (ref 0.0–8.0)
Lymphocytes Absolute: 588 /uL (ref 850–3900)
Lymphocytes Relative: 6 % (ref 15.0–45.0)
Monocytes Absolute: 529 /uL (ref 200–950)
Monocytes Relative: 5.4 % (ref 0.0–12.0)
Neutrophils Absolute: 8634 /uL (ref 1500–7800)
Neutrophils Relative: 88.1 % (ref 40.0–80.0)
nRBC: 0 /100 WBC (ref 0–0)

## 2022-01-03 LAB — HEPATIC FUNCTION PANEL
ALT: 21 U/L (ref 7–52)
AST: 25 U/L (ref 13–39)
Albumin: 2.6 g/dL (ref 3.5–5.7)
Alkaline Phosphatase: 54 U/L (ref 36–125)
Bilirubin, Direct: 0.1 mg/dL (ref 0.0–0.4)
Bilirubin, Indirect: 0.2 mg/dL (ref 0.0–1.1)
Total Bilirubin: 0.3 mg/dL (ref 0.0–1.5)
Total Protein: 6 g/dL (ref 6.4–8.9)

## 2022-01-03 LAB — LIPASE: Lipase: 52 U/L (ref 4–82)

## 2022-01-03 LAB — URINALYSIS-MACROSCOPIC W/REFLEX TO MICROSCOPIC
Bilirubin, UA: NEGATIVE
Glucose, UA: NEGATIVE mg/dL
Ketones, UA: NEGATIVE mg/dL
Nitrite, UA: NEGATIVE
Protein, UA: >=500 mg/dL
Specific Gravity, UA: 1.019 (ref 1.005–1.035)
Urobilinogen, UA: <2 mg/dL (ref 0.2–1.9)
pH, UA: 6 (ref 5.0–8.0)

## 2022-01-03 LAB — BASIC METABOLIC PANEL
Anion Gap: 5 mmol/L (ref 3–16)
BUN: 25 mg/dL (ref 7–25)
CO2: 30 mmol/L (ref 21–33)
Calcium: 8.3 mg/dL (ref 8.6–10.3)
Chloride: 98 mmol/L (ref 98–110)
Creatinine: 0.41 mg/dL (ref 0.60–1.30)
EGFR: >90
Glucose: 152 mg/dL (ref 70–100)
Osmolality, Calculated: 283 mOsm/kg (ref 278–305)
Potassium: 4.7 mmol/L (ref 3.5–5.3)
Sodium: 133 mmol/L (ref 133–146)

## 2022-01-03 LAB — CBC
Hematocrit: 30 % (ref 35.0–45.0)
Hemoglobin: 10.2 g/dL (ref 11.7–15.5)
MCH: 34.7 pg (ref 27.0–33.0)
MCHC: 33.9 g/dL (ref 32.0–36.0)
MCV: 102.3 fL (ref 80.0–100.0)
MPV: 7.2 fL (ref 7.5–11.5)
Platelets: 426 10*3/uL (ref 140–400)
RBC: 2.94 10*6/uL (ref 3.80–5.10)
RDW: 12.4 % (ref 11.0–15.0)
WBC: 9.8 10*3/uL (ref 3.8–10.8)

## 2022-01-03 LAB — HIGH SENSITIVITY TROPONIN
High Sensitivity Troponin: 15 ng/L (ref 0–14)
High Sensitivity Troponin: 15 ng/L (ref 0–14)

## 2022-01-03 MED ORDER — ceFEPIme (MAXIPIME) 2 g in sodium chloride 0.9% 100 mL IVPB
Freq: Three times a day (TID) | INTRAVENOUS | Status: AC
Start: 2022-01-03 — End: 2022-01-04
  Administered 2022-01-04 (×3): 2 g via INTRAVENOUS

## 2022-01-03 MED ORDER — acetaminophen (TYLENOL) tablet 650 mg
325 | ORAL | Status: AC | PRN
Start: 2022-01-03 — End: 2022-01-03

## 2022-01-03 MED ORDER — esomeprazole (NEXIUM) 40 mg capsule FOR FEEDING TUBE
40 | Freq: Every day | Status: AC
Start: 2022-01-03 — End: 2022-01-06
  Administered 2022-01-04 – 2022-01-06 (×3): 40 mg

## 2022-01-03 MED ORDER — ondansetron (ZOFRAN) injection 4 mg
4 | INTRAMUSCULAR | Status: AC | PRN
Start: 2022-01-03 — End: 2022-01-06
  Administered 2022-01-04: 22:00:00 4 mg via INTRAVENOUS

## 2022-01-03 MED ORDER — 0.9 % NaCl with KCl 20 mEq infusion
20 | INTRAVENOUS | Status: AC
Start: 2022-01-03 — End: 2022-01-03

## 2022-01-03 MED ORDER — electrolyte-R (pH 7.4) (NORMOSOL-R pH 7.4) 1,000 mL IV fluid
Freq: Once | INTRAVENOUS | Status: AC
Start: 2022-01-03 — End: 2022-01-03
  Administered 2022-01-03: 20:00:00 1000 mL via INTRAVENOUS

## 2022-01-03 MED ORDER — sodium chloride 0.9 % infusion
INTRAVENOUS | Status: AC
Start: 2022-01-03 — End: 2022-01-05
  Administered 2022-01-04 (×2): 75 mL/h via INTRAVENOUS

## 2022-01-03 MED ORDER — vancomycin (VANCOCIN) 750 mg in sodium chloride 0.9% 250 mL ADDaptor IVPB
750 | INTRAVENOUS | Status: AC
Start: 2022-01-03 — End: 2022-01-03

## 2022-01-03 MED ORDER — polyethylene glycol (MIRALAX) packet 17 g
17 | Freq: Every day | ORAL | Status: AC
Start: 2022-01-03 — End: 2022-01-06
  Administered 2022-01-04 – 2022-01-06 (×3): 17 g via GASTROSTOMY

## 2022-01-03 MED ORDER — magnesium hydroxide (MILK OF MAGNESIA) 2,400 mg/10 mL oral suspension 10 mL
2400 | Freq: Every day | ORAL | Status: AC | PRN
Start: 2022-01-03 — End: 2022-01-06

## 2022-01-03 MED ORDER — acetaminophen (TYLENOL) oral solution Soln 650 mg
650 | ORAL | Status: AC | PRN
Start: 2022-01-03 — End: 2022-01-06
  Administered 2022-01-04 – 2022-01-06 (×4): 650 mg via GASTROSTOMY

## 2022-01-03 MED ORDER — vancomycin (VANCOCIN) 750 mg in sodium chloride 0.9% 250 mL ADDaptor IVPB
Freq: Two times a day (BID) | INTRAVENOUS | Status: AC
Start: 2022-01-03 — End: 2022-01-06
  Administered 2022-01-04 – 2022-01-06 (×4): 750 mg/kg via INTRAVENOUS

## 2022-01-03 MED ORDER — loratadine (CLARITIN) tablet 10 mg
10 | Freq: Every day | ORAL | Status: AC
Start: 2022-01-03 — End: 2022-01-03

## 2022-01-03 MED ORDER — atorvastatin (LIPITOR) tablet 10 mg
10 | Freq: Every day | ORAL | Status: AC
Start: 2022-01-03 — End: 2022-01-06
  Administered 2022-01-04 – 2022-01-06 (×3): 10 mg via GASTROSTOMY

## 2022-01-03 MED ORDER — enoxaparin (LOVENOX) for prophylaxis syringe 40 mg/0.4 mL
40 | Freq: Every day | SUBCUTANEOUS | Status: AC
Start: 2022-01-03 — End: 2022-01-06
  Administered 2022-01-04 – 2022-01-06 (×3): 40 mg via SUBCUTANEOUS

## 2022-01-03 MED ORDER — vancomycin (VANCOCIN) 1,250 mg in sodium chloride 0.9 % 250 mL IVPB
Freq: Once | INTRAVENOUS | Status: AC
Start: 2022-01-03 — End: 2022-01-04
  Administered 2022-01-04: 05:00:00 1250 mg/kg via INTRAVENOUS

## 2022-01-03 MED FILL — ACETAMINOPHEN 650 MG/20.3 ML ORAL SOLUTION: 650 650 mg/20.3 mL | ORAL | Qty: 20.3

## 2022-01-03 MED FILL — NORMOSOL-R PH 7.4 INTRAVENOUS SOLUTION: 1000.00 1000.00 mL | INTRAVENOUS | Qty: 1000

## 2022-01-03 MED FILL — VANCOMYCIN 1,000 MG INTRAVENOUS INJECTION: 1000 1000 mg | INTRAVENOUS | Qty: 1250

## 2022-01-03 NOTE — Unmapped (Addendum)
Imaging reviewed.  Will continue cefepime and vancomycin for now.  No pathology on abdominal imaging : ? If from peg or from prior baldder distention with FC being kinked vs UTI with cultures pending now [facility] has been placed since 3/25  EXAM: CT CHEST WO CONTRAST   EXAM: CT ABDOMEN AND PELVIS WO CONTRAST     INDICATION: Cough, chronic/persisting > 8 weeks, failed empiric treatment,UTI, recurrent/complicated (Female)     COMPARISON: Chest radiograph from the same day and CT abdomen pelvis from 12/26/2021.     TECHNIQUE: Thin section CT images were obtained from the lung apices through the pelvis without contrast. Coronal and sagittal MPR was performed at the scanner.     FIELD OF VIEW: 38 cm.     FINDINGS:   CHEST:     Lower Neck: Unremarkable.     Heart: The heart is not enlarged. Moderate to severe coronary artery calcifications. Mitral annular calcifications.     Vascular Structures: Unremarkable.     Mediastinum And Hila: No lymphadenopathy.     Central airways: Central secretions most significant in the right mainstem bronchus with scattered subsegmental mucous plugging     Pleura: Small right and moderate left pleural effusions.     Lungs: Left greater than right passive atelectasis. Patchy peripheral predominant on the right and posterior predominant on the left patchy consolidations with scattered bandlike opacities. Right basilar predominant smooth septal thickening.     Chest Wall: Anasarca.     Osseous Structures: No acute osseous abnormalities or suspicious osseous lesions. Subacute sixth through eighth anterior right rib fractures.     ABDOMEN/PELVIS:     Liver: Noncirrhotic in morphology. ??No focal liver lesions within the limitations of a noncontrast exam.     Biliary Tree: No intra or extra-hepatic biliary ductal dilatation.     Spleen: Normal     Pancreas: Normal     Adrenal Glands: Unchanged right adrenal nodule.     Kidneys/Ureters/Bladder: No focal renal lesions. ??No hydronephrosis. The urinary  bladder is not well visualized due to streak artifact from left hip arthroplasty.     Gastrointestinal Tract: Gastrostomy tube terminates at the body/antrum junction. Gastric, small bowel, colonic caliber and wall thickness are within normal limits. The appendix is not definitively visualized.     Lymphatics: No abdominal or pelvic lymph nodes are enlarged by size criteria.     Vasculature: Scattered atherosclerotic plaque without aneurysmal dilatation.     Peritoneum/Retroperitoneum: Small volume ascites. No free air or loculated fluid collections.     Abdominal Wall/Soft Tissues: Unremarkable.     Genital Structures: The lower pelvis is not well visualized due to streak artifact from left hip arthroplasty.     Osseous Structures: No acute osseous abnormalities or suspicious osseous lesions. Levocurvature of the lumbar spine, which may be exaggerated due to positioning. Postsurgical changes associated with left hip arthroplasty. Partially visualized hardware appears intact without periprosthetic lucency. Moderate right hip right cirrhosis. Subacute or chronic fracture of the left obturator ring.     Impression   IMPRESSION:     CHEST   1. ??Central secretions most significant in the right mainstem bronchus with scattered subsegmental mucous plugging, which may related to aspiration.   2. ??Small right and moderate left pleural effusions with associated passive atelectasis.   3. ??Bilateral patchy consolidations, which may related to multifocal pneumonia or aspiration.   4. ??Right basilar predominant smooth septal thickening, which may related to pulmonary edema.  ABDOMEN/PELVIS   1. ??Small volume ascites, otherwise no acute abnormality of the abdomen and pelvis identified within the limits of a noncontrast exam.     Approved by Carey Bullocks, MD on 01/03/2022 9:46 PM EDT     I have personally reviewed the images and I agree with this report.     Report Verified by: Zane Herald, MD at 01/03/2022 9:57 PM EDT     Result  History    CT Chest WO contrast (Order #[id number]) on 01/03/22

## 2022-01-03 NOTE — Unmapped (Signed)
[place] ED Note    Date of Service: 01/03/2022  Reason for Visit: Abdominal Pain      ED Course and MDM     Michelle Ponce is a 71 y.o. female with a history and presentation as described above in HPI.  The patient was evaluated by myself and the ED Attending Physician, Dr. Gelene Mink. All management and disposition plans were discussed and agreed upon.    Upon presentation, the patient was chronically ill-appearing, afebrile and hemodynamically stable.    The patient is a 71 year old female with past medical history significant for ICH, malnutrition with G-tube in place who presents for evaluation of abdominal pain.  The patient had recently presented on 12/26/2021 for similar complaints with concern for UTI, was treated.  Notably urine micro did not isolate bacteria.  UA today mostly remarkable for blood.  Apparently at the nursing facility today, Foley was kinked and had to be adjusted.  Foley appears to be draining without issues here. BMP without significant abnormalities. Hepatic function panel not consistent with acute hepatitis or cholestatic process.  Low albumin likely secondary to malnutrition. Lipase WNL making acute pancreatitis less likely.  CBC with reported low drop from 7 days ago.  There were no reports of blood per rectum by nursing facility.  Performed rectal exam with chaperone present. DRE showed no signs of hemorrhoids, fissures, blood, rectal tone intact and had normal Swor stool.  Otherwise EKG was nonischemic with troponins are elevated but stable reassuring against ACS.  Chest x-ray with patchy airspace disease otherwise without clinical symptoms of pneumonia.  Ultimately given hemoglobin drop and concerns from family regarding new onset lethargy, will admit for further evaluation.    Amount and complexity of data reviewed:    Independent historian:  spouse    External data reviewed:  labs, radiology, EKG and notes    Labs:  ordered.  Details documented in MDM above.    Radiology:  ordered. Details documented in MDM above.    ECG/medicine tests:  ordered. Details documented in MDM above.    Discussed with external providers:  Yes. Details documented in MDM above.    Risk  Drugs:  N/A    Treatment:  Decision regarding hospitalization    At this time the patient has been admitted to medicine for further evaluation and management of acute anemia, ab pain. The patient will continue to be monitored here in the emergency department until which time she is moved to her new treatment location.    Impression     1. Abdominal pain, unspecified abdominal location    2. Acute anemia         Plan   1. The patient is to be admitted in stable/improved condition  2. Workup, treatment and diagnosis were discussed with the patient and/or family members; the patient agrees to the plan and all questions were addressed and answered.      Patient History     HPI  Michelle Ponce is a 71 y.o. female with PMH significant for ICH, hypertension, hyperlipidemia, recent femur fracture on Lovenox prophylaxis who presents for evaluation of ab pain.     Per nursing facility, patient had woken today with a kinked Foley, this has been resolved and they have irrigated the Foley removed blood clots.  She had abdominal pain at the time, however after Foley issues were resolved, patient continued to complain of chest and abdominal pain.    On primary history here, patient only endorses abdominal pain, does not  note chest pain currently.    Other than stated above, no additional aggravating or alleviating factors are noted.    Past Medical History:   Diagnosis Date   ??? Hypercholesteremia    ??? Hypertension    ??? Stroke (CMS-HCC)      History reviewed. No pertinent surgical history.  Patient  reports that she has never smoked. She has never used smokeless tobacco. She reports that she does not currently use alcohol. She reports that she does not use drugs.  Current Discharge Medication  List      CONTINUE these medications which have NOT CHANGED    Details   atorvastatin (LIPITOR) 10 MG tablet 1 tablet (10 mg total) by Per G Tube route daily.      enoxaparin (LOVENOX) 40 mg/0.4 mL Syrg Inject 0.4 mLs (40 mg total) subcutaneously daily.      loratadine (CLARITIN) 10 mg tablet 1 tablet (10 mg total) by Per G Tube route daily.      pantoprazole (PROTONIX) 20 MG tablet 1 tablet (20 mg total) every morning before breakfast.      traMADoL (ULTRAM) 50 mg tablet 1 tablet (50 mg total) by Per G Tube route every 6 hours as needed for Pain.             Allergies:   Allergies as of 01/03/2022 - Fully Reviewed 01/03/2022   Allergen Reaction Noted   ??? Aspirin  12/26/2021   ??? Atenolol  12/26/2021   ??? Codeine  12/26/2021   ??? Methylprednisone  12/26/2021   ??? Sudafed [pseudoephedrine hcl]  12/26/2021       All nursing notes and triage notes were appropriately reviewed in the course of the creation of this note.     Review of Systems     ROS as stated above, all other systems reviewed and are negative.    Physical Exam     Vitals:    01/03/22 1800 01/03/22 1830 01/03/22 1930 01/03/22 2032   BP: 110/69 114/70 111/66 121/77   BP Location:    Right arm   Patient Position:    Lying   Pulse: 95 93 97 93   Resp:    16   Temp:    97.8 ??F (36.6 ??C)   TempSrc:    Axillary   SpO2: 96% 96% 94% 100%   Weight:       Height:           General:  Chronically ill-appearing.   Eyes:  Pupils reactive. No discharge from eyes   ENT:  No discharge from nose. OP clear  Neck:  Supple, trachea midline  Pulmonary:   Non-labored breathing. Breath sounds clear bilaterally  Cardiac:  Regular rate and rhythm. No murmurs  Abdomen:  Soft.  Tender to palpation throughout, no rebound, guarding or rigidity.  Non-distended  Musculoskeletal:  No long bone deformity.    Vascular:  Extremities warm and perfused.  Skin:  Dry, no rashes  Neuro:  Alert and oriented x3. Moves all four extremities to command. No focal deficit  CN II-XII intact. Sensation  grossly intact to light touch. Speech and mentation normal.  Psych: Appropriate mood and affect    Diagnostic Studies     Labs:  Please see electronic medical record for any tests performed in the ED    Radiology:  X-ray Portable Chest   Final Result   IMPRESSION:    Patchy airspace disease greatest at the left lung base which may reflect aspiration  or pneumonia.      Report Verified by: Zane Herald, MD at 01/03/2022 4:06 PM EDT      CT Chest WO contrast    (Results Pending)   CT Abdomen and Pelvis WO IV contrast    (Results Pending)       Delton Coombes, MD, PGY-4  [facility]           Delton Coombes, MD  Resident  01/03/22 [id number]

## 2022-01-03 NOTE — Unmapped (Signed)
Problem: Knowledge Deficit  Goal: Patient/family/caregiver demonstrates understanding of disease process, treatment plan, medications, and discharge instructions  Description: Complete learning assessment and assess knowledge base.  Outcome: Progressing     Problem: Potential for Compromised Skin Integrity  Goal: Skin integrity is maintained or improved  Description: Assess and monitor skin integrity. Identify patients at risk for skin breakdown on admission and per policy. Collaborate with interdisciplinary team and initiate plans and interventions as needed.  Outcome: Progressing  Goal: Nutritional status is improving  Description: Monitor and assess patient for malnutrition (ex- brittle hair, bruises, dry skin, pale skin and conjunctiva, muscle wasting, smooth red tongue, and disorientation). Collaborate with interdisciplinary team and initiate plan and interventions as ordered.  Monitor patient's weight and dietary intake as ordered or per policy. Utilize nutrition screening tool and intervene per policy. Determine patient's food preferences and provide high-protein, high-caloric foods as appropriate.   Outcome: Progressing     Problem: Incontinence  Goal: Perineal skin integrity is maintained or improved  Description: Assess genitourinary system, perineal skin, labs (urinalysis), and history of incontinence to include past management, aggravating, and alleviating factors.  Collaborate with interdisciplinary team and initiate plans and interventions as needed.  Outcome: Progressing

## 2022-01-03 NOTE — Unmapped (Signed)
Pharmacy Vancomycin Monitoring Service    Michelle Ponce is a 71 y.o. female started on vancomycin. Pharmacy consulted for dosing, monitoring, and adjustment.    Height: 5' 2 (157.5 cm)   Wt Readings from Last 3 Encounters:   01/03/22 114 lb 14.4 oz (52.1 kg)   12/26/21 114 lb 14.4 oz (52.1 kg)        Ideal body weight: 50.1 kg (110 lb 7.2 oz)  Adjusted ideal body weight: 50.9 kg (112 lb 3.7 oz)    Pertinent Laboratory Values:       Lab 01/03/22  1535   CREATININE 0.41*   BUN 25   WBC 9.8       Weight used for CrCl calculation: 50.1 kg  Baseline SCr: 0.4 mg/dL (used 0.7mg /dl)  Current EstCrCl: 69 mL/min    Staph aureus nasal swab results:  No results found for requested labs within last 720 hours.  No results found for requested labs within last 720 hours.    Assessment/Plan:  ?? Michelle MonicaDeborah Ponce is a 71 y.o. female started on vancomycin for pneumonia. Vancomycin goal 15-20.  ?? Vancomycin:  ?? This patient was not on vancomycin prior to admission.  ?? This patient does not have recent vancomycin dosing history.  ?? Received vancomycin 1250 mg x 1  ?? Followed by vancomycin 750 mg q12h  ?? No labs ordered   ?? Pharmacy will continue to monitor patient and adjust therapy as indicated    Thank you for the consult.     Judee ClaraCourtney Lilias Lorensen, Rph.    01/03/2022 11:21 PM  Phone: [phone]    For clinical pharmacy assistance on evenings and weekends, please call [phone].

## 2022-01-03 NOTE — Unmapped (Signed)
Pt arrived from [place]. Pt c/o Abdominal pain. Pt treated recently for UTI and has foley placed. Pt on eliquis. Hx of stroke. Pt has gtube and gets tube feeding. Pt lethargic but oriented x 4.

## 2022-01-03 NOTE — Unmapped (Signed)
Bed: C24W  Expected date:   Expected time:   Means of arrival:   Comments:  [place] 71 y/o female abd pain

## 2022-01-03 NOTE — Unmapped (Signed)
Family at bedside.

## 2022-01-03 NOTE — Unmapped (Signed)
Per facility they noted blood in foley. They irrigated it and stated no clots. Nurse also reported that she was c/o CP this morning and MD wanted her sent out and evaluated.

## 2022-01-03 NOTE — Unmapped (Addendum)
[facility]  History and Physical    Admit Date:   01/03/2022    Patient's PCP:  Alden Server, MD  DOB:     [birth year]  Patient Class:  Observation    CHIEF COMPLAINT     Chief Complaint   Patient presents with   ??? Abdominal Pain       History was obtained from the spouse Michelle Ponce phone #[phone]  And son Michelle Ponce phone #[phone].  Both son and spouse are at bedside.  No history could be obtained from the patient.    Additional history was obtained from chart review.    HISTORY OF PRESENT ILLNESS   Michelle Ponce is a 71 y.o. female with  medical history of    Hypertension  Hyperlipidemia  Malnutrition  Hemorrhagic CVA with left hemiparesis x9 years ago dysphagia on a pur??ed diet.   Patient is walker bound.   In the last 3 years has required increasing help from spouse with ADLs and ADLs.   3 weeks ago she had a mechanical fall, on 12/14/2021 and was hospitalized at [facility].  Spouse and son states  there has been prior multiple falls.  She was found to have an intertrochanteric fracture of the left hip and underwent repair.  In addition given her severe cachexia with severe protein calorie malnutrition and hyponatremia given her poor p.o. intake a PEG tube was placed.  Oral intakes were encouraged however this was limited.  Her family said that she was severely constipated x8 days and only had 1 bowel movement and was again constipated when she arrived at the skilled nursing facility [facility] x8 days subsequently she is now having diarrhea with multiple episodes throughout the day.  She had been recently noncommunicative almost lethargic and was appearing to be in a lot of pain and so on 12/27/2018 she was brought to the emergency department and was found to have urinary retention with abdominal distention.  She had a CT of the abdominal pelvis that showed postsurgical changes of the left hip.  In addition severe levoscoliosis and chronic degenerative changes  of the thoracolumbar spine.  In addition she was found to have chronic chest wall deformity with remote rib fractures and a remote left superior pubic rami and pubic body fractures with severe right hip osteoarthritis.  She received a smog enema.  There was a concern for possible urinary tract infection and she received a dose of ceftriaxone and was discharged back to the facility with a prescription for Keflex/cephalexin.  Per spouse she continues to be on his antibiotics. (Urine microbiology however revealed less than 1000 CFU per mL of skin/urogenital flora with no further work-up indicated.)  2 days ago per discussion with son and spouse she suddenly woke up she was alert and awake.  She had a lot of family visiting her.  She was suddenly hungry.  This continued for Friday and Saturday.  As a consequence the spouse felt that her he infection was being treated.  However this morning when he came to visit her approximate 11:00AM she appeared to be in severe pain.  She was tender in her abdomen.  She had no urine output from the Foley catheter that was placed on her ER visit on 12/27/2021 : Foley was placed secondary to urinary retention.  This staff mentioned that she continued to moan overnight.  Her spouse described her stomach as being as hard as a rock.  Her blood pressure slowly  dropped to 101/62.  The staff at the facility manipulated her catheter and flush her catheter and there was some blood clots.  He was then advised to bring her to the emergency department.      Per discussion with family the patient has not been very active even at the other facility/postoperatively.  She has limitation in ambulation.  She was not eating.        PAST MEDICAL HISTORY     Past Medical History:   Diagnosis Date   ??? Hypercholesteremia    ??? Hypertension    ??? Stroke (CMS-HCC)          ??? Allergic rhinitis   prev with allergy shot   ??? Chronic back pain   ??? CVA (cerebral vascular accident) (HCC)   ??? Hemorrhagic cerebrovascular  accident (CVA) (HCC) 6/14   Ruptured blood vessel   ??? Hyperlipidemia   ??? Leukopenia   ??? Osteopenia   last dexa 2010   ??? Retinal detachment   left eye blindness                 PAST SURGICAL HISTORY    Left Hip ORIF 12/15/21  ??? REMV CATARACT INTRACAP,INSERT LENS   ??? RETINAL DETACHMENT SURGERY Left   2006       MEDICATIONS   (Not in a hospital admission)        No current facility-administered medications for this encounter.     Current Outpatient Medications   Medication Sig   ??? atorvastatin 1 tablet (10 mg total) by Per G Tube route daily.   ??? enoxaparin Inject 0.4 mLs (40 mg total) subcutaneously daily.   ??? loratadine 1 tablet (10 mg total) by Per G Tube route daily.   ??? pantoprazole 1 tablet (20 mg total) every morning before breakfast.   ??? traMADoL 1 tablet (50 mg total) by Per G Tube route every 6 hours as needed for Pain.       ALLERGIES   Aspirin, Atenolol, Codeine, Methylprednisone, and Sudafed [pseudoephedrine hcl]    SOCIAL HISTORY   TOBACCO:  reports that she has never smoked. She has never used smokeless tobacco.       ETOH:   reports that she does not currently use alcohol.  DRUG:    Social History     Substance and Sexual Activity   Drug Use Never       FAMILY HISTORY   Diabetes Father   ??? Heart Disease Father   51   ??? High Cholesterol Father   ??? High BP Father   ??? Depression Father   ??? Diabetes Mother   ??? Arthritis Mother   ??? Arthritis Sister   ??? Cancer Brother   jaw   ??? Thyroid Disease Neg Hx   ??? Stroke Neg Hx   ??? Asthma Neg Hx   ??? Blood Disorder Neg Hx   ??? Anemia Neg Hx   ??? Kidney Disease Neg Hx   ??? Alzheimer's disease Neg Hx   ??? Osteoporosis Neg Hx   ??? Adoption Neg Hx   ??? No Known Medical Problem Neg Hx   ??? CHF Neg Hx   ??? Seizures Neg Hx       REVIEW OF SYSTEMS   Unable to be obtained due to mental state    PHYSICAL EXAM   BP 114/70    Pulse 93    Temp 99.2 ??F (37.3 ??C) (Axillary)    Resp 17    Ht 5'  2 (1.575 m)    Wt 114 lb 14.4 oz (52.1 kg)    SpO2 96%    BMI 21.02 kg/m??       General Appearance:    Briefly awakens with name calling however unable to communicate  She is unable to follow commands  Occasional nonpurposeful movement of her upper extremities  Limited movements of her lower extremities  Pale  Chronically ill-appearing   Head:   Temporal muscle wasting   Eyes:   Right pupil reactive; left pupil nonreactive ( per spouse : blind in left eye)   Throat:  Dry mucus membranes    Neck:  Supple, trachea midline  Has her head more to the right side   Lungs:   Diminished to auscultation bilaterally, respirations unlabored    Heart:  Regular rate and rhythm, S1 and S2 normal   Abdomen:  Soft, tender on the left mid and left lower quadrant, bowel sounds active all four quadrants  PEG in place   Extremities:  Extremities with venous stasis changes  Presence of a healing wound left lower extremity  OA changes  Thickened nailbeds   Pulses:  2+ and symmetric    Skin:  Warm and dry    Has levoscoliosis and is more on her right side     Peg Site       Left hip surgical wound    Left lower extremity            LABS     Results for orders placed or performed during the hospital encounter of 01/03/22   Basic metabolic panel   Result Value Ref Range    Sodium 133 133 - 146 mmol/L    Potassium 4.7 3.5 - 5.3 mmol/L    Chloride 98 98 - 110 mmol/L    CO2 30 21 - 33 mmol/L    Anion Gap 5 3 - 16 mmol/L    BUN 25 7 - 25 mg/dL    Creatinine 0.98 (L) 0.60 - 1.30 mg/dL    Glucose 119 (H) 70 - 100 mg/dL    Calcium 8.3 (L) 8.6 - 10.3 mg/dL    Osmolality, Calculated 283 278 - 305 mOsm/kg    EGFR >90    Hepatic Function Panel   Result Value Ref Range    Total Bilirubin 0.3 0.0 - 1.5 mg/dL    Bilirubin, Direct 0.1 0.0 - 0.4 mg/dL    AST 25 13 - 39 U/L    ALT 21 7 - 52 U/L    Alkaline Phosphatase 54 36 - 125 U/L    Total Protein 6.0 (L) 6.4 - 8.9 g/dL    Albumin 2.6 (L) 3.5 - 5.7 g/dL    Bilirubin, Indirect 0.2 0.0 - 1.1 mg/dL   CBC   Result Value Ref Range    WBC 9.8 3.8 - 10.8 10E3/uL    RBC 2.94 (L) 3.80 - 5.10 10E6/uL    Hemoglobin  10.2 (L) 11.7 - 15.5 g/dL    Hematocrit 14.7 (L) 35.0 - 45.0 %    MCV 102.3 (H) 80.0 - 100.0 fL    MCH 34.7 (H) 27.0 - 33.0 pg    MCHC 33.9 32.0 - 36.0 g/dL    RDW 82.9 56.2 - 13.0 %    Platelets 426 (H) 140 - 400 10E3/uL    MPV 7.2 (L) 7.5 - 11.5 fL   Differential   Result Value Ref Range    Neutrophils Relative 88.1 (H) 40.0 - 80.0 %  Lymphocytes Relative 6.0 (L) 15.0 - 45.0 %    Monocytes Relative 5.4 0.0 - 12.0 %    Eosinophils Relative 0.4 0.0 - 8.0 %    Basophils Relative 0.1 0.0 - 1.0 %    nRBC 0 0 - 0 /100 WBC    Neutrophils Absolute 8,634 (H) 1,500 - 7,800 /uL    Lymphocytes Absolute 588 (L) 850 - 3,900 /uL    Monocytes Absolute 529 200 - 950 /uL    Eosinophils Absolute 39 15 - 500 /uL    Basophils Absolute 10 0 - 200 /uL   Lipase   Result Value Ref Range    Lipase 52 4 - 82 U/L   Urinalysis-Macroscopic w/Rfx to Microsco   Result Value Ref Range    Color, UA Red (A) Yellow,Straw    Clarity, UA Turbid (A) Clear    Specific Gravity, UA 1.019 1.005 - 1.035    pH, UA 6.0 5.0 - 8.0    Protein, UA >=500 (A) Negative mg/dL    Glucose, UA Negative Negative mg/dL    Ketones, UA Negative Negative mg/dL    Bilirubin, UA Negative Negative    Blood, UA Large (A) Negative    Nitrite, UA Negative Negative    Urobilinogen, UA <2.0 0.2 - 1.9 mg/dL    Leukocytes, UA Large (A) Negative   High Sensitivity Troponin   Result Value Ref Range    High Sensitivity Troponin 15 (H) 0 - 14 ng/L   High Sensitivity Troponin   Result Value Ref Range    High Sensitivity Troponin 15 (H) 0 - 14 ng/L         RADIOLOGY   X-ray Abdomen AP view    Result Date: 12/26/2021  EXAM: XR ABDOMEN AP VIEW ONLY INDICATION: sbo TECHNIQUE: 2 supine views the abdomen and pelvis. COMPARISON: None Available. FINDINGS: Visualized lungs are clear. A gastrostomy tube projects over the left upper quadrant. There is a relative paucity of small bowel gas. No colonic distention. Moderate colonic stool burden. Postsurgical changes of left hip hemiarthroplasty.  Moderate right hip osteoarthritis. Scoliotic curvature of the thoracolumbar spine.     IMPRESSION:  Nonspecific bowel gas pattern with moderate colonic stool burden. Report Verified by: William Hamburger, MD at 12/26/2021 1:41 PM EDT    CT Abdomen and Pelvis With IV contrast    Result Date: 12/26/2021  EXAM: CT ABDOMEN AND PELVIS WITH IV CONTRAST INDICATION: Abdominal distention, G-tube placement 9 days prior and left femoral fracture repair 12 days prior TECHNIQUE: CT of the abdomen and pelvis was performed after the administration of intravenous contrast. Axial images were obtained with coronal and sagittal reconstructions. CONTRAST: 100 mL of IOHEXOL 350 MG IODINE/ML INTRAVENOUS SOLUTION administered intravenously FIELD OF VIEW: 33.6 cm COMPARISON: Abdominal radiograph 1 hour prior. FINDINGS: Lower chest: Mild bibasilar parenchymal opacities. Left basilar atelectasis. Small left pleural effusion. No suspicious pulmonary nodules. Normal heart size. No pericardial effusion. Liver: Smooth contour. No enhancing lesions. Few scattered subcentimeter hypoattenuating lesions that are too small to characterize. Biliary tree/Gallbladder: Contracted gallbladder. No cholelithiasis, gallbladder wall thickening, or pericholecystic fluid. No intra or extrahepatic biliary ductal dilation. Spleen: Normal. Pancreas: Normal. Adrenal glands: Nonenhancing right adrenal nodule measuring 1.3 x 1.3 cm in axial cross section (series 301, image 24). Normal left adrenal gland. Kidneys/Ureters/Bladder: Symmetric cortical enhancement bilaterally. No enhancing lesions. No hydronephrosis. Collapsed urinary bladder in the setting of a Foley catheter. Gastrointestinal tract: Percutaneous gastrostomy tube tip within the gastric body. Large and small bowel bowel  are normal in caliber and wall thickness. The appendix is not definitively visualized. No inflammatory changes to suggest acute appendicitis. Lymphatics: No lymphadenopathy. Vasculature: Calcified  atherosclerosis of the normal caliber abdominal aorta and its major branches, including plaques causing mild to moderate stenosis of the bilateral renal arteries and SMA ostium. Peritoneum/Retroperitoneum: No free air or free fluid. Abdominal wall/soft tissues: Diffuse edema surrounding the left hip. Multiple foci of soft tissue gas are present, likely postsurgical related to recent repair. Genital Organs: Atrophic uterus. Osseous structures: Postsurgical changes of left hip hemiarthroplasty without evidence of hardware complication. Moderate to severe degenerative changes of the right hip joint. Severe levoscoliosis and chronic degenerative changes of the thoracolumbar spine. Chronic chest wall deformity with remote rib fractures. Remote left superior pubic ramus and pubic body fracture. Severe right hip osteoarthritis.     IMPRESSION: 1.  No acute findings in the abdomen or pelvis. 2.  Soft tissue edema and gas surrounding the left hip, likely related to recent hemiarthroplasty. 3.  Incidental 1.3 cm right adrenal nodule, likely an adenoma. Consider comparison with prior imaging or 12 month follow up as clinically indicated. 4.  Calcified atherosclerosis causing mild to moderate stenosis of the bilateral renal arteries and SMA ostium. I have personally reviewed the images and I agree with this report. Report Verified by: William Hamburger, MD at 12/26/2021 3:45 PM EDT    X-ray Portable Chest    Result Date: 01/03/2022  EXAM: XR PORTABLE CHEST INDICATION: Chest pain, unspecified TECHNIQUE: 1 view of the chest. COMPARISON: 12/26/2021 FINDINGS: Medical Devices: None. Heart and Mediastinum: Cardiomediastinal silhouette is within normal limits. Lungs and Pleura: Patchy bilateral airspace disease greatest at the left lung base with small left pleural effusion. No pneumothorax.. No evidence for pneumothorax. Bones and soft tissues: No acute abnormalities.     IMPRESSION: Patchy airspace disease greatest at the left lung base which  may reflect aspiration or pneumonia. Report Verified by: Zane Herald, MD at 01/03/2022 4:06 PM EDT       ASSESSMENT & PLAN     Patient will be admitted to the hospital for/with the following problems:      Altered mental status  Acute metabolic encephalopathy likely with underlying delirium  Patient with impaired function secondary to prior CVA with known left hemiparesis and dysphagia limited ability to provide for self   Status post left hip ORIF  No immediate obvious signs of infection at both surgical sites and left lower extremity wound  Portable chest x-ray with questionable patchy airspace disease greatest at the left lung which could be aspiration or pneumonia : For now we will send for CT of the chest abdomen and pelvis given in addition she continues to grimace upon palpation of the left lower quadrant   Urinalysis has been sent with urine cultures pending  Will request blood cultures  Patient has Foley catheter and urine appears to be concentrated : We will start an IV fluids  Empirically placed on cefepime and vancomycin given concern of pneumonia with recent hospital stay  If no significant improvement : ?  If need to have a CT of the head without contrast versus MRI    Malnutrition  Speech therapy to evaluate for dysphagia given concern of aspiration  N.p.o. for now  All nutrition/medications through PEG tube   Nutrition consult for PEG feeding  Additional labs: Magnesium and phosphorus pending    Low normal blood pressures   Blood pressure  has been documented as low as 99/62.  She has been given IV fluids  We will continue IV fluids for now  Patient with underlying diagnosis of hypertension however not on any medications    Anemia  ?  Postoperative blood loss  However also secondary to anemia of chronic disease  Anemia labs sent  We will follow H&H  Family unaware of any rectal bleeding      Known diagnosis:    Allergic rhinitis : Hold Claritin for now  Hyperlipidemia : To continue statin for  PEG      DVT proph with lovenox : Continued from the facility postoperatively  BM regimen : MiraLAX through PEG  GI prophylaxis : Nexium per PEG    CODE STATUS was discussed with patient's spouse Michelle Ponce : The patient is a full code    I believe that patient requires inpatient services and is anticipated to require >2 midnight stay in the hospital:  Comorbidities: CVA with left hemiparesis/recent hip surgery/malnutrition  Current medical needs: IVF/MS evaluation  Risk of adverse events:progression of decline / death  In my medical opinion patient is not safe for discharge from the hospital/outpatient care.      I personally reviewed the patient's medical record, labs, radiology reports and discussed the patient's case with the ER provider submitting the patient for admission.  Admission was discussed with the patient in detail; all questions answered and patient agrees with plan.    Electronically signed,  Al Pimple, MD  Pager: [phone]  01/03/2022  7:11 PM  [facility]

## 2022-01-03 NOTE — Unmapped (Signed)
Bladder scan result average of across 3 readings

## 2022-01-03 NOTE — Unmapped (Signed)
ED Attending Attestation Note    Date of service:  01/03/2022    This patient was seen by the resident physician.  I have seen and examined the patient, agree with the workup, evaluation, management and diagnosis. The care plan has been discussed and I concur.     My assessment reveals a 71 y.o. female with prior ICH, history of malnutrition with a G-tube in place.  Patient's came in on 325 for altered mental status and abdominal pain and was ultimately diagnosed with a UTI however this returned culture negative.  Since going home from that encounter she is actually improved and had 3 or 4 really good days where she has been feeling better eating more and has been more awake and alert.  Today she was moaning complaining of significant abdominal pain and was rather lethargic when family came to see her at the nursing facility and so husband went and got someone to come evaluate her, they noticed that the Foley was kinked/not draining and they were able to get it to drain and she spontaneously drained 2 L.  She seems more comfortable now but still remains more lethargic than she has been the last few days.  Husband had the facility doctor evaluate her and he recommended evaluation in the emergency department.    On exam she is chronically unwell appearing, thin, frail, malnourished and heavily deconditioned, she has normal S1 and S2 with no appreciable murmurs, she has some contractions of the extremities, she has a soft, nontender nondistended abdomen, she has a Foley in place draining blood-tinged urine.

## 2022-01-04 ENCOUNTER — Inpatient Hospital Stay: Admit: 2022-01-04 | Payer: MEDICARE

## 2022-01-04 ENCOUNTER — Inpatient Hospital Stay: Admit: 2022-01-05 | Payer: MEDICARE

## 2022-01-04 ENCOUNTER — Encounter

## 2022-01-04 LAB — HIGH SENSITIVITY TROPONIN: High Sensitivity Troponin: 12 ng/L (ref 0–14)

## 2022-01-04 LAB — RENAL FUNCTION PANEL W/EGFR
Albumin: 2.3 g/dL — ABNORMAL LOW (ref 3.5–5.7)
Anion Gap: 5 mmol/L (ref 3–16)
BUN: 21 mg/dL (ref 7–25)
CO2: 30 mmol/L (ref 21–33)
Calcium: 8 mg/dL — ABNORMAL LOW (ref 8.6–10.3)
Chloride: 101 mmol/L (ref 98–110)
Creatinine: 0.23 mg/dL — ABNORMAL LOW (ref 0.60–1.30)
EGFR: >90
Glucose: 86 mg/dL (ref 70–100)
Osmolality, Calculated: 284 mosm/kg (ref 278–305)
Phosphorus: 2.5 mg/dL (ref 2.1–4.5)
Potassium: 4.1 mmol/L (ref 3.5–5.3)
Sodium: 136 mmol/L (ref 133–146)

## 2022-01-04 LAB — VITAMIN B12: Vitamin B-12: 827 pg/mL (ref 180–914)

## 2022-01-04 LAB — POC GLU MONITORING DEVICE
POC Glucose Monitoring Device: 71 mg/dL (ref 70–100)
POC Glucose Monitoring Device: 86 mg/dL (ref 70–100)

## 2022-01-04 LAB — DIFFERENTIAL
Basophils Absolute: 21 /uL (ref 0–200)
Basophils Relative: 0.3 % (ref 0.0–1.0)
Eosinophils Absolute: 126 /uL (ref 15–500)
Eosinophils Relative: 1.8 % (ref 0.0–8.0)
Lymphocytes Absolute: 728 /uL — ABNORMAL LOW (ref 850–3900)
Lymphocytes Relative: 10.4 % — ABNORMAL LOW (ref 15.0–45.0)
Monocytes Absolute: 322 /uL (ref 200–950)
Monocytes Relative: 4.6 % (ref 0.0–12.0)
Neutrophils Absolute: 5803 /uL (ref 1500–7800)
Neutrophils Relative: 82.9 % — ABNORMAL HIGH (ref 40.0–80.0)
nRBC: 0 /100{WBCs} (ref 0–0)

## 2022-01-04 LAB — HEPATIC FUNCTION PANEL
ALT: 17 U/L (ref 7–52)
AST: 16 U/L (ref 13–39)
Albumin: 2.3 g/dL — ABNORMAL LOW (ref 3.5–5.7)
Alkaline Phosphatase: 47 U/L (ref 36–125)
Bilirubin, Direct: 0.1 mg/dL (ref 0.0–0.4)
Bilirubin, Indirect: 0.3 mg/dL (ref 0.0–1.1)
Total Bilirubin: 0.4 mg/dL (ref 0.0–1.5)
Total Protein: 5.1 g/dL — ABNORMAL LOW (ref 6.4–8.9)

## 2022-01-04 LAB — CBC
Hematocrit: 25.1 % — ABNORMAL LOW (ref 35.0–45.0)
Hemoglobin: 8.6 g/dL — ABNORMAL LOW (ref 11.7–15.5)
MCH: 35.1 pg — ABNORMAL HIGH (ref 27.0–33.0)
MCHC: 34.5 g/dL (ref 32.0–36.0)
MCV: 101.7 fL — ABNORMAL HIGH (ref 80.0–100.0)
MPV: 6.9 fL — ABNORMAL LOW (ref 7.5–11.5)
Platelets: 357 10*3/uL (ref 140–400)
RBC: 2.47 10*6/uL — ABNORMAL LOW (ref 3.80–5.10)
RDW: 12.2 % (ref 11.0–15.0)
WBC: 7 10*3/uL (ref 3.8–10.8)

## 2022-01-04 LAB — PHOSPHORUS: Phosphorus: 2.6 mg/dL (ref 2.1–4.5)

## 2022-01-04 LAB — IRON STUDIES
% Iron Saturation: 11.7 % — ABNORMAL LOW (ref 15.0–55.0)
Iron: 21 ug/dL — ABNORMAL LOW (ref 50–212)
TIBC: 180 ug/dL — ABNORMAL LOW (ref 265–497)

## 2022-01-04 LAB — BLOOD CULTURE-PERIPHERAL
Culture Result: NO GROWTH
Culture Result: NO GROWTH

## 2022-01-04 LAB — MAGNESIUM
Magnesium: 2.2 mg/dL (ref 1.5–2.5)
Magnesium: 2.1 mg/dL (ref 1.5–2.5)

## 2022-01-04 LAB — TSH: TSH: 2.66 u[IU]/mL (ref 0.45–4.12)

## 2022-01-04 LAB — FOLATE: Folic Acid: 20.9 ng/mL (ref 5.90–24.80)

## 2022-01-04 LAB — URINE CULTURE: Culture Result: NO GROWTH

## 2022-01-04 MED ORDER — OMNIPAQUE (iohexol) 350 mg iodine/mL 125 mL
350 | Freq: Once | INTRAVENOUS | Status: AC | PRN
Start: 2022-01-04 — End: 2022-01-04
  Administered 2022-01-04: 20:00:00 125 mL via INTRAVENOUS

## 2022-01-04 MED ORDER — ceFEPIme (MAXIPIME) 2 g in sodium chloride 0.9% 100 mL IVPB
Freq: Two times a day (BID) | INTRAVENOUS | Status: AC
Start: 2022-01-04 — End: 2022-01-06
  Administered 2022-01-05 – 2022-01-06 (×3): 2 g via INTRAVENOUS

## 2022-01-04 MED ORDER — gadobutrol (GADAVIST) 1 mmol/1 mL IV solution 5 mL
1 | Freq: Once | INTRAVENOUS | Status: AC | PRN
Start: 2022-01-04 — End: 2022-01-04
  Administered 2022-01-05: 03:00:00 5 mL/kg via INTRAVENOUS

## 2022-01-04 MED ORDER — morphine injection 0.5 mg
2 | Freq: Once | INTRAVENOUS | Status: AC
Start: 2022-01-04 — End: 2022-01-05

## 2022-01-04 MED ORDER — sodium chloride 0.9 % infusion
INTRAVENOUS | Status: AC
Start: 2022-01-04 — End: 2022-01-05

## 2022-01-04 MED FILL — SODIUM CHLORIDE 0.9 % INTRAVENOUS SOLUTION: 75.00 75.00 mL/hr | INTRAVENOUS | Qty: 1000

## 2022-01-04 MED FILL — ONDANSETRON HCL (PF) 4 MG/2 ML INJECTION SOLUTION: 4 4 mg/2 mL | INTRAMUSCULAR | Qty: 2

## 2022-01-04 MED FILL — CEFEPIME 2 GRAM SOLUTION FOR INJECTION: 2 2 gram | INTRAMUSCULAR | Qty: 1

## 2022-01-04 MED FILL — ENOXAPARIN 40 MG/0.4 ML SUBCUTANEOUS SYRINGE: 40 40 mg/0.4 mL | SUBCUTANEOUS | Qty: 0.4

## 2022-01-04 MED FILL — NEXIUM 40 MG CAPSULE,DELAYED RELEASE: 40 40 mg | ORAL | Qty: 1

## 2022-01-04 MED FILL — SODIUM CHLORIDE 0.9 % INTRAVENOUS SOLUTION: INTRAVENOUS | Qty: 50

## 2022-01-04 MED FILL — ACETAMINOPHEN 650 MG/20.3 ML ORAL SOLUTION: 650 650 mg/20.3 mL | ORAL | Qty: 20.3

## 2022-01-04 MED FILL — ATORVASTATIN 10 MG TABLET: 10 10 MG | ORAL | Qty: 1

## 2022-01-04 MED FILL — POLYETHYLENE GLYCOL 3350 17 GRAM ORAL POWDER PACKET: 17 17 gram | ORAL | Qty: 1

## 2022-01-04 MED FILL — VANCOMYCIN 750 MG INTRAVENOUS SOLUTION: 750 750 mg | INTRAVENOUS | Qty: 750

## 2022-01-04 NOTE — Unmapped (Signed)
[place]  Care Management/Social Work Assessment      Patient Information     Patient Name: Michelle Ponce  MRN: [id number]  Hospital Day: 0  Inpatient/Observation: Inpatient  Admit Date: 01/03/2022  Admission Diagnosis: Abdominal pain, unspecified abdominal location [R10.9]  Acute anemia [D64.9]  Attending provider: Melissa Montane, MD    PCP: Alden Server, MD  Home Pharmacy:              [facility] - [place], [place] - [address] [place] [address]  [address] [place] [address]  [place] [address]  Phone: [phone]        Pertinent Medications  Anticoagulation therapy: Yes  Anticoagulant (Name of Drug): lovenox  New Diabetic: No      Issues related to obtaining medications: none    Payor Information   Medical Insurance Coverage:  Payor: MEDICARE / Plan: MEDICARE A AND B / Product Type: Medicare /   Secondary Payor: [employer]    Functional Assessment   Functional Assessment  Assessment Information Obtained From:: Spouse  May We Obtain Collateral Information From Family, Friends and Neighbors?: Yes  Current Mental Status: Unable to Assess (patient resting with eyes closed)  Activities of Daily Living: Total Assistance Needed  Work History: Retired  Marital Status: Married    Current Living Arrangements   Current Living Arrangements  Current Living Arrangements: Skilled Nursing Facility  Skilled Nursing Facility Name/Phone #: [facility] [place] [facility]    Electrical engineer at Home: Not Applicable    Support Systems   Emergency contact: Extended Emergency Contact Information  Primary Emergency Contact: [name],[name]  Mobile Phone: [phone]  Relation: Spouse  Secondary Emergency Contact: [name],aaron  Mobile Phone: [phone]  Relation: Son  Interpreter needed? No    Support Systems  Legal Status: HCPOA  Name of Guardian/POA/ Payee and Phone Number: [name] - spouse - #[phone]  Primary Caregiver: Facility  Times of available support: Total 24/7 hands on (add  comment)  Marital Status: Married  Next of Kin: [name] Corporate investment banker  Next of Kin Relationship:  Spouse  Next of Kin Phone Number: #[phone]  Assessment Information Obtained From:: Spouse    Other Pertinent Information     Patient is hospital day #1 for abdominal pain/acute anemia.    CM met with patient and spouse to discuss discharge plans. Patient was resting with eyes closed so CM spoke with spouse. He states patient has been at [facility] [place] [facility] and the plan is to return at discharge. CM placed referral in Epic to [facility] at this time.    CM spoke with Gen at [facility] [place] and she confirmed patient was SNF level of care and can return when medically ready.     Advance Directives (For Healthcare)  Advance Directive: Patient does not have advance directive  Healthcare Agent Appointed: No  Pre-existing DNR/DNI Order: No  Patient Requests Assistance: No    Discharge Plan     Met with patient to initiate discussion regarding discharge planning. Introduced self and role of case management/social work and provided Tour manager.    Anticipated Discharge Plan: Return to [facility] [place] [facility]    Anticipated Discharge Date: TBD    Anticipated Transportation: Will need transport    Patient/Family aware and taking part in the discharge plan.  Patient/family educated that once post-acute care needs have been identified, a provider list applicable to the identified post-acute care needs as well as the insurance provider will be provided, and  patient/family have the freedom to choose their provider(s); financial interest(s) are disclosed as appropriate.    Atticus Lemberger L. Katrinka Blazing RN, CM  Phone Number: [phone]

## 2022-01-04 NOTE — Unmapped (Signed)
Clinical Pharmacy Service - Renal Dosing Progress Note    Subjective/Objective:  Michelle Ponce is a 71 y.o. female being treated with cefepime for concern for PNA    Ht Readings from Last 1 Encounters:   01/03/22 5' 2 (1.575 m)     Wt Readings from Last 2 Encounters:   01/03/22 114 lb 14.4 oz (52.1 kg)   12/26/21 114 lb 14.4 oz (52.1 kg)     Ideal body weight: 50.1 kg (110 lb 7.2 oz)  Adjusted ideal body weight: 50.9 kg (112 lb 3.7 oz)       Lab 01/04/22  0419 01/03/22  1535   CREATININE 0.23* 0.41*   BUN 21 25     Weight used to calculate CrCl: 52.1 kg  Estimated CrCl = ~40 mL/min    Dialysis Type (if applicable): n/a      Assessment/Plan:  Renal dose changes made based on a CrCl of 30-59 mL/min  Dose adjustments are summarized in the table below.    Current Drug Therapy and Dose New Drug Therapy and Dose   Cefepime 2 g q8h Cefepime 2 g q12h       Renal dose change was made per DPD Committee Policy, [facility]MED [id number]-[id number]; Renal Dosing: Protocol for renal function-based dose adjustments. Contact pharmacy at [facility] [phone] if there are any questions. Exceptions to the pharmacy renal dosing policy include: CVVH, antivirals in transplant patients, medications not listed on the dosing guideline, Dose as Written indicated in the medication order.    Thanks,    Rogers Seeds, PharmD, BCPS  Clinical Pharmacy Specialist, Trauma/Surgery  Phone: [phone]

## 2022-01-04 NOTE — Unmapped (Signed)
Palliative Care   Initial Consultation Note    PATIENT NAME: Michelle Ponce  DATE OF BIRTH: 11/12/1950  ATTENDING:   PCP: Alden Server, MD  MRN#: [id number]  [id number]  ADMIT DATE: 01/03/2022  LOS: 0  DATE OF SERVICE: 01/04/2022    ASSESSMENT AND RECOMMENDATIONS / PLAN   Michelle Ponce is a 71 y.o. female with a history of HTN, HLD, CVA (c/b L hemiparesis and malnutrition) admitted 01/03/2022 with AMS.  Palliative care consulted for goals of care.    1. Goals of Care / Treatment Preferences related to CVA c/b dysphagia s/p PEG and severe protein calorie malnutrition:  Date 01/04/2022 (with husband and son [place] Custard)    Overall goals are rehabilitative so that patient can eventually return home with her husband [place].  They are focused on managing acute issues and getting her back to her previous level of function before her hip fracture a few weeks ago.  Confirmed full code status today.      Briefly discussed overall decline including her stroke history with hip fracture and subsequent dependence on tube feeds and protein calorie malnutrition.      --Code status: Full Code  --Preferred Disposition: TBD         2. Psychosocial/Spiritual:   Care Provided: care/communication coordination and therapeutic/empathic listening  Palliative care interdisciplinary team will continue to follow for support.    3. Advance Directives/Surrogate Decision Maker:  Advance Directives:Patient does not have advance directive  Advance Directives Type:   Agent/DPOA:    Agent/DPOA Phone:      Next of Kin:   Next of Kin relationship:   Next of Kin Phone:      HISTORY   Chief complaint  Confusion    Reason for consult:  Dr. Thedore Mins requested a palliative care consultation for Michelle Ponce for goals of care.    History of present illness:  Michelle Ponce is a 71 y.o. female with a past medical history of TN, HLD, CVA (c/b L hemiparesis and malnutrition) admitted 01/03/2022 with AMS.  Patient suffered hemorrhagic CVA 9 years ago.  Over the past three  years, she has required increased assistance with ADLs.  Three weeks prior, she was admitted after a fall to [facility] (12/14/2021) and found to have intertrochanteric femur fracture.  She was also noted to be severely cachectic and a PEG tube was placed.      She has been increasingly lethargic and noncommunicative recently and was brought to the ED on 3/25.  She was given a SMOG enema, a foley cathter and started on antibiotics for a UTI and discharged from the ED back home.  Initially she did well but developed severe abdominal pain the morning of representation with decreased UOP.  She was started on empiric broad spectrum antibiotics.  CT chest c/w aspiration or infection.             Family shared that patient had been living at home with husband and ambulating with walker prior to a few weeks ago when she fell and suffered hip fracture.  They shared that the first two days post op patient was doing well with therapy and maintaining adequate nutrition.  However, a PEG tube was pursed around POD three and she has declined ever since including not wanting to eat and being less interactive.  She had two good days after her recent ED visit but quickly became less interactive and not wanting to take any PO.     Social History: (ex:  living situation, primary care giver, support people, etc:)  Patient is a resident of [place]     Spiritual History: (FICA)  Patient and husband's faith is very important to them.  They belong to [facility]     Past Medical History:   Diagnosis Date   ??? Hypercholesteremia    ??? Hypertension    ??? Stroke (CMS-HCC)      History reviewed. No pertinent surgical history.  Current Outpatient Medications   Medication Instructions   ??? atorvastatin (LIPITOR) 10 mg, Per G Tube, Daily   ??? enoxaparin (LOVENOX) 40 mg, Subcutaneous, Daily   ??? loratadine (CLARITIN) 10 mg, Per G Tube, Daily   ??? pantoprazole (PROTONIX) 20 mg, Every morning before breakfast   ??? traMADoL (ULTRAM) 50 mg,  Per G Tube, Every 6 hours PRN     Scheduled Meds:  ??? atorvastatin  10 mg Per G Tube Daily 0900   ??? ceFEPime (MAXIPIME) IV extended infusion  2 g Intravenous 3 times per day   ??? enoxaparin  40 mg Subcutaneous Daily 0900   ??? esomeprazole  40 mg Per NG / OG tube DAILY 0600   ??? morphine  0.5 mg Intravenous Once   ??? polyethylene glycol  17 g Per G Tube Daily 0900   ??? vancomycin  15 mg/kg Intravenous Q12H     Continuous Infusions:  ??? sodium chloride 0.9 % 75 mL/hr (01/03/22 2042)     PRN Meds:.acetaminophen (TYLENOL) 325 mg/10.15 mL oral solution, magnesium hydroxide, ondansetron  Allergies   Allergen Reactions   ??? Aspirin    ??? Atenolol    ??? Codeine    ??? Methylprednisone    ??? Sudafed [Pseudoephedrine Hcl]        Comprehensive Pain Assessment:  Comprehensive Pain Assessment (with every routine assessment)  Patient's Stated Pain Goal: No pain  Pain Assessment: NRS Pain Score (0-10)  Pain Score: 0 - No Pain  Pain Location: Hip  Pain Descriptors: Sore  Pain Intervention(s): Repositioned, Emotional support    Comprehensive Symptom Assessment:  Shortness of Breath: None  Dry Mouth: Mild   Nausea: None -  Lack of Appetite: Severe   Weight Loss: Moderate   Constipation: Mild   Fatigue/ Weakness: Severe   Poor Sleep/ Insomnia: Unable to Assess    Drowsiness/Sedation: Moderate    Poor Concentration/Confusion: Mild    Nervousness/Irritability: None   Worry/Anxiety: Unable to Assess   Sadness/Depression: Unable to Assess   Total Symptom Distress Score (Calculated): 13       Functional status:  Palliative Performance Scale Score (PPS): 40%  40%-1. Mainly in Bed; 2. Unable to do most activity, Extensive disease; 3. Mainly assistance; 4. Normal or reduced; 5. Full or Drowsy +/- Confusion      OBJECTIVE     Vital Signs:  BP 145/86 (BP Location: Right arm, Patient Position: Lying)    Pulse 92    Temp 97.7 ??F (36.5 ??C) (Axillary)    Resp 16    Ht 5' 2 (1.575 m)    Wt 114 lb 14.4 oz (52.1 kg)    SpO2 92%    BMI 21.02 kg/m??      Intake/Output:     Intake/Output Summary (Last 24 hours) at 01/04/2022 1357  Last data filed at 01/04/2022 0947  Gross per 24 hour   Intake 190 ml   Output 1625 ml   Net -1435 ml       Physical Exam:  General Appearance:chronically ill appearing and cachectic;  in NAD  HEENT:dry mucosa  Pulmonary:normal work of breathing and no tachypnea, retractions or cyanosis  Cardiovascular:deferred  WG:[id number]  Musculoskeletal:no lower or upper extremity edema  Skin:warm, dry, normal color, without lesions or rash    Labs Personally Reviewed:     Lab Results   Component Value Date/Time    WBC 7.0 01/04/2022 04:19 AM    RBC 2.47 (L) 01/04/2022 04:19 AM    HCT 25.1 (L) 01/04/2022 04:19 AM    PLT 357 01/04/2022 04:19 AM      Lab Results   Component Value Date/Time    NA 136 01/04/2022 04:19 AM    K 4.1 01/04/2022 04:19 AM    CL 101 01/04/2022 04:19 AM    CO2 30 01/04/2022 04:19 AM    BUN 21 01/04/2022 04:19 AM    CREATININE 0.23 (L) 01/04/2022 04:19 AM    GLUCOSE 86 01/04/2022 04:19 AM    CALCIUM 8.0 (L) 01/04/2022 04:19 AM     Lab Results   Component Value Date    ALBUMIN 2.3 (L) 01/04/2022    ALBUMIN 2.3 (L) 01/04/2022     Imaging Personally Reviewed:     CT CHEST WO CONTRAST; CT ABDOMEN AND PELVIS WO CONTRAST 01/03/2022 - IMPRESSION:   CHEST   1. ??Central secretions most significant in the right mainstem bronchus with scattered subsegmental mucous plugging, which may related to aspiration.   2. ??Small right and moderate left pleural effusions with associated passive atelectasis.   3. ??Bilateral patchy consolidations, which may related to multifocal pneumonia or aspiration.   4. ??Right basilar predominant smooth septal thickening, which may related to pulmonary edema.     ABDOMEN/PELVIS   1. ??Small volume ascites, otherwise no acute abnormality of the abdomen and pelvis identified within the limits of a noncontrast exam.     ______________    The palliative care team appreciates the opportunity to be involved in the care of this  patient. For any questions or concerns, please page the palliative care team via [facility] or the operator.    I attest that I spent 75 minutes for today's visit performing the following activities: Chart review, Obtaining/Reviewing History, Discussing advance directives/advance care planning, Discussing code status, Discussing goals of care with patient/family, Communicating with primary team and/or consultants on the patient's care plan, Documenting in the Patient's Medical Record, Providing active listening and support related to serious or life-limiting illness and Care Coordination    Electronically signed by:  Azzie Glatter, DO   01/04/2022

## 2022-01-04 NOTE — Unmapped (Signed)
PRN Zofran given for nausea.

## 2022-01-04 NOTE — Unmapped (Signed)
[facility]  [place]  DEPARTMENT OF FAMILY MEDICINE  DAILY PROGRESS NOTE         Chief Complaint / Reason for Follow-Up     the patient Michelle Ponce is a 71 y.o. female who presents for   Chief Complaint   Patient presents with   ??? Abdominal Pain     Interval History / Subjective      Overnight : NAOE     Consults: SLP, nutrition, palliative care     Significant Imagining/Labs changes: ECG 01/04/2022 NSR    Past Medical History:   Diagnosis Date   ??? Hypercholesteremia    ??? Hypertension    ??? Stroke (CMS-HCC)          Review of Systems      Unable to obtain     Medications     Current Facility-Administered Medications   Medication Dose Route Frequency Provider Last Rate Last Admin   ??? acetaminophen (TYLENOL) oral solution Soln 650 mg  650 mg Per G Tube Q4H PRN Alvina FilbertNenuka Reddy, MD   650 mg at 01/04/22 0858   ??? atorvastatin (LIPITOR) tablet 10 mg  10 mg Per G Tube Daily 0900 Clarisa SchoolsBrad Evans, DO   10 mg at 01/04/22 0854   ??? ceFEPIme (MAXIPIME) 2 g in sodium chloride 0.9% 100 mL IVPB  2 g Intravenous 3 times per day Alvina FilbertNenuka Reddy, MD 25 mL/hr at 01/04/22 0707 2 g at 01/04/22 0707   ??? enoxaparin (LOVENOX) for prophylaxis syringe 40 mg/0.4 mL  40 mg Subcutaneous Daily 0900 Clarisa SchoolsBrad Evans, DO   40 mg at 01/04/22 0854   ??? esomeprazole (NEXIUM) 40 mg capsule FOR FEEDING TUBE  40 mg Per NG / OG tube DAILY 0600 Clarisa SchoolsBrad Evans, DO   40 mg at 01/04/22 0707   ??? magnesium hydroxide (MILK OF MAGNESIA) 2,400 mg/10 mL oral suspension 10 mL  10 mL Oral Daily PRN Clarisa SchoolsBrad Evans, DO       ??? morphine injection 0.5 mg  0.5 mg Intravenous Once Zenovia JordanJennifer H Wells, CNP       ??? ondansetron (ZOFRAN) injection 4 mg  4 mg Intravenous Q4H PRN Clarisa SchoolsBrad Evans, DO       ??? polyethylene glycol (MIRALAX) packet 17 g  17 g Per G Tube Daily 0900 Brad Evans, DO   17 g at 01/04/22 0854   ??? sodium chloride 0.9 % infusion  75 mL/hr Intravenous Continuous Alvina FilbertNenuka Reddy, MD 75 mL/hr at 01/03/22 2042 75 mL/hr at 01/03/22 2042   ??? vancomycin (VANCOCIN) 750 mg in sodium chloride 0.9% 250 mL ADDaptor  IVPB  15 mg/kg Intravenous Q12H Alvina FilbertNenuka Reddy, MD           Vital Signs      Vitals:    01/04/22 0736   BP: (!) 148/91   Pulse: 92   Resp: 16   Temp: 97.5 ??F (36.4 ??C)   SpO2: 94%           Physical Exam     General: difficult to arouse, somnolent,    Heent/neck: NCAT. Neck soft, supple, warm  Cv: HRRR, S1S2, no murmur appreciated   Resp:CTA b/l no wheezes, rales, ronchi  GI: Distended, small grimace in LLQ, increased tympany, PEG tube in place    Ext: warm, no edema   Neuro: will answer with muted mumbles, unable to follow commands,, somnolent   Psych: unable to assess     Laboratory Data         Lab Results  Component Value Date    WBC 7.0 01/04/2022    HGB 8.6 (L) 01/04/2022    HCT 25.1 (L) 01/04/2022    MCV 101.7 (H) 01/04/2022    PLT 357 01/04/2022     Lab Results   Component Value Date    CREATININE 0.23 (L) 01/04/2022    BUN 21 01/04/2022    NA 136 01/04/2022    K 4.1 01/04/2022    CL 101 01/04/2022    CO2 30 01/04/2022              Diagnostic Studies      CT Abdomen and Pelvis WO IV contrast    Result Date: 01/03/2022  IMPRESSION: CHEST 1.  Central secretions most significant in the right mainstem bronchus with scattered subsegmental mucous plugging, which may related to aspiration. 2.  Small right and moderate left pleural effusions with associated passive atelectasis. 3.  Bilateral patchy consolidations, which may related to multifocal pneumonia or aspiration. 4.  Right basilar predominant smooth septal thickening, which may related to pulmonary edema. ABDOMEN/PELVIS 1.  Small volume ascites, otherwise no acute abnormality of the abdomen and pelvis identified within the limits of a noncontrast exam. Approved by Carey Bullocks, MD on 01/03/2022 9:46 PM EDT I have personally reviewed the images and I agree with this report. Report Verified by: Zane Herald, MD at 01/03/2022 9:57 PM EDT    X-ray Abdomen AP view    Result Date: 12/26/2021  IMPRESSION:  Nonspecific bowel gas pattern with moderate colonic stool burden.  Report Verified by: William Hamburger, MD at 12/26/2021 1:41 PM EDT    CT Chest WO contrast    Result Date: 01/03/2022  IMPRESSION: CHEST 1.  Central secretions most significant in the right mainstem bronchus with scattered subsegmental mucous plugging, which may related to aspiration. 2.  Small right and moderate left pleural effusions with associated passive atelectasis. 3.  Bilateral patchy consolidations, which may related to multifocal pneumonia or aspiration. 4.  Right basilar predominant smooth septal thickening, which may related to pulmonary edema. ABDOMEN/PELVIS 1.  Small volume ascites, otherwise no acute abnormality of the abdomen and pelvis identified within the limits of a noncontrast exam. Approved by Carey Bullocks, MD on 01/03/2022 9:46 PM EDT I have personally reviewed the images and I agree with this report. Report Verified by: Zane Herald, MD at 01/03/2022 9:57 PM EDT    CT Abdomen and Pelvis With IV contrast    Result Date: 12/26/2021  IMPRESSION: 1.  No acute findings in the abdomen or pelvis. 2.  Soft tissue edema and gas surrounding the left hip, likely related to recent hemiarthroplasty. 3.  Incidental 1.3 cm right adrenal nodule, likely an adenoma. Consider comparison with prior imaging or 12 month follow up as clinically indicated. 4.  Calcified atherosclerosis causing mild to moderate stenosis of the bilateral renal arteries and SMA ostium. I have personally reviewed the images and I agree with this report. Report Verified by: William Hamburger, MD at 12/26/2021 3:45 PM EDT    X-ray Portable Chest    Result Date: 01/03/2022  IMPRESSION: Patchy airspace disease greatest at the left lung base which may reflect aspiration or pneumonia. Report Verified by: Zane Herald, MD at 01/03/2022 4:06 PM EDT         Assessment & Plan       Michelle Ponce is a 71 y.o. with a history of CVA w/ residual hemiparesis, L hip fracture with arthoplasty, who is admitted for AMS and abdominal pain.     #  AMS concern for acute metabolic  encephalopathy   -empiric vanc cefepime   -Blood cultures/ sputum cultures pending  -CT w/ pneumonia vs aspiration   -B12 WNL  -MRI w contrast   -Urine cultures ordered      #abdominal pain  -Ct w and wo contrast with small volume ascites,  -bladder scan on 4/2 with   -CT Ab with contrast      #Anemia- most likely iron deficiency although MCV elevated with normal B12 and folate   -Trend hemoglobin       # HLD - continue Lipitor 10 mg via Peg, no acute concerns at this time     #HTN  -No concerns at this time     #Urinary retention requiring Foley placement  Bladder scan in ED w/o sign of retention, continue to monitor foley output and readjust as needed  - holding home Claritin     #Hx of CVA w/ residual hemiparesis   -SLP eval order, NPO at this time, on pureed diet at home     #Pain  -Tylenol PRN      #Hx of malnurtition w/ PEG placement   Nutrition  Consulted for tube feeds  -palliative acre consulted      Orders Placed This Encounter   Procedures   ??? Diet NPO past midnight Except for: no exceptions     DVT prophylaxis: Enoxaparin (prophylaxis)  Code Status: Full Code   Lines/Drains- PEG tube, Foley cath (3/25)  Fluid Status- s/p 1L normosol om 4/2, on 70ml/hr NS  Dispo planning- Pt/OT following with short-term skilled PT/OT recommended           Loyola Mast, MS IV  01/04/2022 1052AM    ATTESTATION  Patient seen with the medical student and examined on this date  D/w medical student: agree with evaluation  My summary:  1. AMS: s/w better this am; suspecting primarily from infection (?uti, ?(asp) pna); iv cefepime and vanc ongoing: D2; await cultures; MRI head; abd still distended and tender; CT w?o con was ok, shd get CT w con; sounds like she may have had CDI recently, if diarrhea recurs get stool for c diff  2. ?pna: aspiration?; SLP cs  3. Anemia: iron sat low; elev mcv but normal b12 and folate; Continue to monitor       Fluids, Electrolytes and Nutrition:   Diet: tube feeds; iv NS @ 75;  DVT  prophylaxis:   lovenox  Discharge planning:    obs status but H&P doc inpatient status which seems more reasonable; expect will need 1-2n further in hospital; PT/OT evals: expect will return to SNF p dc; overall status isnt great and she has had several acute issues over past several weeks: pall consult re code status and goals of care    40 mins    Melissa Montane, MD, FAAFP   Associate Professor of Family Medicine [facility])   [facility]

## 2022-01-04 NOTE — Unmapped (Signed)
[place]  Palliative Care Spiritual Assessment    PATIENT NAME: Michelle Ponce  DATE OF BIRTH: [birth year]    PCP: Alden Server, MD  MRN#: [id number]  [id number]  ADMIT DATE: 01/03/2022  LOS: 0  DATE OF SERVICE: 01/04/2022    Family contact today: Daron Offer (spouse) and [place] Custard (son)     Interventions: Palliative Care Team visited pt today for initial consult. Spouse present with son on speaker phone. Team made introductions and discussed goals of care with family. Pt sleeping. Family is active at [facility], and leadership know about pt admission. Spouse asked chaplain to pray and chaplain prayed as requested. No other needs were presented.    Spiritual Assessment:  Religious Affiliation:Christian               Community: family    Address/ Action in Care (Plan):           Advances Directives/Decision Making:      Current Code Status: Full Code      Advance Directives:Patient does not have advance directive            Advance Directives Type:       Surrogate Decision Maker:       Surrogate relationship:       Surrogate Phone:          Cultural/Spiritual History:  Specific spiritual/ cultural preferences: Ephriam Knuckles  Anticipating grief/ loss issues identified: none  Risk for complicated grief: low        Emmerson Taddei  4:00 PM      Thank you for allowing the Palliative Care Team to participate in the care of this patient. Please call the Palliative Care Services for any questions or concerns:  [phone]. For after hours and weekends [phone] and the operator will page the on-call provider.

## 2022-01-04 NOTE — Unmapped (Signed)
Pt FSBS was 71. Physician called. Husband stated pt is on 24h tube feeds through PEG tube at home. Physician ordered the same tube feed that she gets at home for her to get here. The hope is that this will improve her blood sugar. Will check blood sugar again in 6 hours.

## 2022-01-04 NOTE — Unmapped (Signed)
Occupational Therapy  Initial Assessment and Treatment     Name: Michelle Ponce  DOB: May 21, 1951  Attending Physician: Melissa Montane, MD  Admission Diagnosis: Abdominal pain, unspecified abdominal location [R10.9]  Acute anemia [D64.9]  Date: 01/04/2022  Reviewed Pertinent hospital course: Yes  Hospital Course PT/OT: Pt is a 71 yo female who presents with abdominal pain. CXR: patchy airspace disease. Pt sustained a mechanical fall, on 12/14/2021 and she was found to have an intertrochanteric fracture of the left hip and s/p left hip hemiarthroplasty, PEG tube placed during admission at [facility]. CT abdominal pelvis: postsurgical changes of the left hip, chronic chest wall deformity with remote rib fractures and a remote left superior pubic rami and pubic body fractures with severe right hip osteoarthritis.  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  In addition she was found to have chronic chest wall deformity with remote rib fractures and a remote left superior pubic rami and pubic body fractures with severe right hip osteoarthritis.  Precautions: Full code, fall risk, LLE FWB (per [facility] documentation)  Activity Level: Activity as tolerated    Assessment  OT 6 Clicks  Help From Another Person To Put On/Take Off Regular Lower Body Clothing: Total  Help From Another Person Bathing ( including washing ,rinsing,drying): Total  Help From Another Person Toileting, which includes using the toilet, bedpan or urinal: Total  Help From Another Person To Put On/Take Off Regular Upper Body Clothing: Total  Help From Another Person Taking Care Of Personal Grooming: Total  Help From Another Person Eating Meals: Total  OT 6 Clicks Score: 6  Assessment: Decreased ADL status, Decreased UE strength, Decreased Safe judgment during ADL, Decreased cognition, Decreased self-care transfers, Decreased UE ROM, Decreased activity tolerance,  Decreased Functional Mobility, Decreased Balance        Co-treatment indicated: secondary to anticipated level of skilled assistance required to safely treat and mobilize patient       Prognosis for OT goals: Guarded (given significant deconditioning)                        Goals  Goals to be met in: one week, by 01/11/22  Patient stated goal: patient unable to state  Patient will complete supine to sit in prep for ADLs: Maximum assistance (assist x2)  Patient will complete grooming task: Maximum assistance  Patient will complete upper body dressing: Maximum assistance  Pt Will participate in upper extremity HEP to prep for ADLs: .  Miscellaneous Goal #1: Pt will tolerate sitting EOB x2 minutes with Mod A for sitting balance during ADLs/functional activity.  Miscellaneous Goal #2: Pt will perform bed mobility with mod A for rolling R/L for hygiene purposes.  Collaborated with: Patient    Recommendation  Plan  Treatment Interventions: ADL retraining, Functional transfer training, UE strengthening/ROM, Therapeutic Activity, Compensatory technique education, Patient/Family training, Cognitive reorientation, Neuro muscular reeducation, Activity Tolerance training  Progress: Slow progress, decreased activity tolerance  OT Frequency: minimum 3x/week  Recommendation  Recommendation: Short-term skilled OT  Equipment Recommendations: Defer at this time      Problem List  There is no problem list on file for this patient.       Past Medical History  Past Medical History:   Diagnosis Date   ??? Hypercholesteremia    ??? Hypertension    ??? Stroke (CMS-HCC)         Past Surgical History  History reviewed. No pertinent surgical history.    Home Living/Prior Function  Patient able to provide accurate information at this time: Yes, but patient is/may be a poor historian and accuracy should be confirmed  Comments: Pt has been residing at [facility]. Pt residing at home with family prior to fall and fx hip on 12/14/21. Per chart multiple  prior falls. Pt states she ambulated with a walker prior to hip fx. States since then has only gotten up 1 time and is unable to stand or walk        Pain  Pain Score: 5   Pain Location: Hip  Pain Descriptors: Sore  Pain Intervention(s): Repositioned;Emotional support  Therapist reported pain to: RN is aware and monitoring     Vision  Overall Vision/ Perception: Unable to assess       Cognition  Overall Cognitive Status: Impaired  Cognitive Assessment: Arousal/ Alertness;Orientation Level;Behavior;Following Commands;Safety Judgment;Insight;Memory  Arousal/Alertness: Fluctuating alertness  Orientation Level: Oriented to person;Oriented to place  Behavior: Distracted;Flat affect  Following Commands: Requires verbal cues;Requires tactile cues;Requires repetition of instruction;Requires increased time (inconsistently follows one step commands)  Safety Judgment: Decreased awareness of safety precautions;Decreased awareness of need for assistance;Decreased safety awareness  Insight: Demonstrated decreased insight into limitations and abilities to complete ADLs safely    Neuromuscular  Overall Sensation: Unable to assess          Right Upper Extremity   Right UE ROM: Grossly WFL as observed during functional activities  Right UE Strength: Grossly WFL (at least 3+/5) as observed during functional activities  Right UE Muscle Tone: Normal  Right Hand Function: Grossly WFL as observed during functional activity         Left Upper Extremity  Left UE ROM: WFL except  Left UE ROM Comment: shoulder flexion to ~80 degrees (previous CVA affecting L side)  Left UE Strength: Grossly WFL (at least 3+/5) as observed during functional activities  Left UE Muscle Tone: Normal  Left UE Hand Function: WFL except  Left UE Hand Function Comment: weak grasp noted         Functional Mobility  Bed Mobility   Rolling: of 2 people;increased time to complete task;towards the left;towards the right;use of handrail;Total assistance (pt rolled R/L  multiple times for hygiene and repositioning purposes)  Supine to Sit: Total assistance;of 2 people;head of bed elevated;towards the right;use of handrail  Sit to Supine: Total assistance;of 2 people;head of bed flat;towards the right  Unable to progress further due to: pt with generalized weakness/deconditioning  Balance  Sitting - Static: Maximum Assistance    ADL  Feeding: N/A-NPO  Toileting: Total assistance (foley)  Treatment provided during OT evaluation: Activities of Daily Living    Treatment  Treatment provided this date in addition to the Occupational Therapy Evaluation, includes the following:    X 1 Therapeutic Activity:  8 minutes   Facilitated safe pt use of call light system to alert nursing staff to her needs. Pt able to utilize standard call light with R thumb with great effort. Provided soft touch call light for increased ease of use and pt with more success with this call light. Call light positioned within reach of R hand. Bilateral elbows visually inspected and no redness noted, pt continues to have significant cervical hyper-extension and R cervical flexion despite positioning of pillow and blanket roll to promote neutral head/neck positioning.    Position after Treatment and Safety Handoff  Position after therapy session: Bed  Details: RN notified;RN  present;Call light/ needs within reach  Alarms: Bed  Alarms Status: Activated and Interfaced with call system    Patient Education  Pt. educated on the role of OT/POC, functional transfers, d/c recommendations and safety with ADLs. Pt demo fair understanding of education and requires moderate reinforcement for improved comprehension.      Time  Start Time: 0853  Stop Time: 0932  Time Calculation (min): 39 min    Charges  $OT Evaluation Mod Complex 45 Min: 1 Procedure    $Therapeutic Activity: 8-22 mins        97166- Moderate Complexity Evaluation  This evaluation code was determined based on analysis of pt's occupational profile, performance  deficits, and level of clinical decision-making to complete OT evaluation    **Occupational Profile: Moderate Complexity  Expanded chart review      **Performance deficits: High Complexity  More than 5 performance deficits      **Clinical Decision-making: Moderate Complexity     Co-morbidities affecting pt's current performance: see above    Current level of physical/ verbal assistance:   Max physical/verbal assistance        Cordie Grice, OTR/L  License #: [id number]  Ascom: [phone]  01/04/2022

## 2022-01-04 NOTE — Unmapped (Signed)
Problem: Knowledge Deficit  Goal: Patient/family/caregiver demonstrates understanding of disease process, treatment plan, medications, and discharge instructions  Description: Complete learning assessment and assess knowledge base.  Outcome: Progressing     Problem: Potential for Compromised Skin Integrity  Goal: Skin integrity is maintained or improved  Description: Assess and monitor skin integrity. Identify patients at risk for skin breakdown on admission and per policy. Collaborate with interdisciplinary team and initiate plans and interventions as needed.  Outcome: Progressing  Goal: Nutritional status is improving  Description: Monitor and assess patient for malnutrition (ex- brittle hair, bruises, dry skin, pale skin and conjunctiva, muscle wasting, smooth red tongue, and disorientation). Collaborate with interdisciplinary team and initiate plan and interventions as ordered.  Monitor patient's weight and dietary intake as ordered or per policy. Utilize nutrition screening tool and intervene per policy. Determine patient's food preferences and provide high-protein, high-caloric foods as appropriate.   Outcome: Progressing     Problem: Incontinence  Goal: Perineal skin integrity is maintained or improved  Description: Assess genitourinary system, perineal skin, labs (urinalysis), and history of incontinence to include past management, aggravating, and alleviating factors.  Collaborate with interdisciplinary team and initiate plans and interventions as needed.  Outcome: Progressing

## 2022-01-04 NOTE — Unmapped (Addendum)
[facility]    Wound Care Assessment  01/04/2022 3:49 PM    Michelle Ponce is an 71 y.o. female    Assessment: Patient seen today for wound care consult to assess wounds to buttocks, coccyx and LLE.  Assessment, images and recommendations noted below.  Wound care nurse will continue to follow for weekly assessment.  Please call [facility] regarding any changes to wound and if wound deteriorates.        Last recorded Braden Score by nursing: Braden Scale Score: 13    Sleep support surface: Pressure re-distribution, LAL surface     Pain assessment: Patient has pain with mobility in the bed.      Plan:    1.  Safe skin bundle.   2.  Foam dressings mid back, left ischium and sacral wound  3.  Mepilex transfer Ag to LLE.      Safe S.K.I.N. Pressure injury preventative nursing interventions:   1. Inspect skin every shift.  Consider Low air-loss support surface for increased moisture or decreased mobility. Head of bed at 30 degrees or less. Waffle cushion when up in chair. Float heels off of bed.  Sacral preventative dressing-check every shift.   2. Turn and reposition every 2 hours and as needed.  Limit layers. Use glide sheet or incontinence pad for re-positioning. Limit chair-time to one hour or shift every 30 minutes. If possible, rotate medical devices every 12 hours.   3. Provide incontinent care  and apply skin barriers to protect skin as needed.  Avoid adult diapers when possible. Interdry to skin folds as needed.   4. Address nutrition and hydration.  5. Keep patient turned off of any pressure injuries as much as possible.      Wounds (Other):    DTI to left ischium and stage to sacrum image did not upload to Epic.          Stage 1 to med back       Left medial lower leg   No open wounds noted.    Family states that they have been using medihoney to wound to LLE and the skin in noted to be macerated on assessment.  No open wound seen and maceration noted.  Suggest to wrap LLE with mepilex transfer AG with rolled gauze.           Left lateral lower leg   No open wound noted.         Wound Pressure Ulcer Sacrum (Active)   01/04/22 0900   Wound Number:    Wound Type: Pressure Ulcer   Location: Sacrum   Orientation:    Wound Description (Comments):    Present on admission: Yes   Removal Reason :    Staging (Pressure Ulcer Only) Stage III 01/04/22 0900   Site (Wound bed) Assessment Moist;Pink;Yellow;Slough 01/04/22 0900   Peri-wound Assessment Pink 01/04/22 0900   Wound Length 3 cm 01/04/22 0900   Wound Width 3.5 cm 01/04/22 0900   Depth 0.1 cm 01/04/22 0900   Wound Surface Area (cm^2) 10.5 cm^2 01/04/22 0900   Wound Volume (cm^3) 1.05 cm^3 01/04/22 0900   Treatments Cleanse with saline 01/04/22 0900   Dressing Foam 01/04/22 0900   Dressing Changed Changed 01/04/22 0900   Dressing Status Clean;Dry;Intact 01/04/22 0900   Tunneling 0 cm 01/04/22 0900   Undermining 0 cm 01/04/22 0900       Wound Pressure Ulcer Ischial Left (Active)   01/04/22 0900   Wound Number:  Wound Type: Pressure Ulcer   Location: Ischial   Orientation: Left   Wound Description (Comments):    Present on admission: Yes   Removal Reason :    Staging (Pressure Ulcer Only) Deep tissue injury 01/04/22 0900   Site (Wound bed) Assessment Fragile;Maroon 01/04/22 0900   Peri-wound Assessment Clean;Dry;Intact 01/04/22 0900   Wound Length 2 cm 01/04/22 0900   Wound Width 1 cm 01/04/22 0900   Depth 0 cm 01/04/22 0900   Wound Surface Area (cm^2) 2 cm^2 01/04/22 0900   Wound Volume (cm^3) 0 cm^3 01/04/22 0900   Drainage Amount None 01/04/22 0900   Treatments Cleanse with saline 01/04/22 0900   Dressing Foam 01/04/22 0900   Dressing Changed Changed 01/04/22 0900   Dressing Status Clean;Dry;Intact 01/04/22 0900   Tunneling 0 cm 01/04/22 0900   Undermining 0 cm 01/04/22 0900       Wound Pressure Ulcer Back Left;Medial Stage 1 mid back (Active)   01/04/22 0900   Wound Number:    Wound Type: Pressure Ulcer   Location: Back   Orientation: Left;Medial   Wound Description (Comments):  Stage 1 mid back   Present on admission: Yes   Removal Reason :    Staging (Pressure Ulcer Only) Stage I 01/04/22 0900   Site (Wound bed) Assessment Red;Fragile;Intact 01/04/22 0900   Peri-wound Assessment Clean;Dry;Intact 01/04/22 0900   Wound Length 0.5 cm 01/04/22 0900   Wound Width 0.5 cm 01/04/22 0900   Depth 0 cm 01/04/22 0900   Wound Surface Area (cm^2) 0.25 cm^2 01/04/22 0900   Wound Volume (cm^3) 0 cm^3 01/04/22 0900   Drainage Amount None 01/04/22 0900   Treatments Cleanse with saline 01/04/22 0900   Dressing Foam 01/04/22 0900   Dressing Changed Changed 01/04/22 0900   Dressing Status Clean;Dry;Intact 01/04/22 0900   Tunneling 0 cm 01/04/22 0900   Undermining 0 cm 01/04/22 0900             History:  Diagnosis: Abdominal pain, unspecified abdominal location [R10.9]  Acute anemia [D64.9]  <principal problem not specified>  Past Medical History:   Diagnosis Date   ??? Hypercholesteremia    ??? Hypertension    ??? Stroke (CMS-HCC)      History reviewed. No pertinent surgical history.  Social History     Socioeconomic History   ??? Marital status: Married     Spouse name: Not on file   ??? Number of children: Not on file   ??? Years of education: Not on file   ??? Highest education level: Not on file   Occupational History   ??? Not on file   Tobacco Use   ??? Smoking status: Never   ??? Smokeless tobacco: Never   Substance and Sexual Activity   ??? Alcohol use: Not Currently   ??? Drug use: Never   ??? Sexual activity: Not on file   Other Topics Concern   ??? Not on file   Social History Narrative   ??? Not on file     Allergies   Allergen Reactions   ??? Aspirin    ??? Atenolol    ??? Codeine    ??? Methylprednisone    ??? Sudafed [Pseudoephedrine Hcl]            Labs:    Lab Results   Component Value Date    WBC 7.0 01/04/2022     No results found for: PREALBUMIN  (17.6-36)  Lab Results   Component Value Date  ALBUMIN 2.3 (L) 01/04/2022    ALBUMIN 2.3 (L) 01/04/2022    (3.6-5.1)  No results found for: SEDRATE  No results found for:  CRP    Cru Kritikos NICOLE Elvie Maines  01/04/2022     Wound Care nurse Contact info:  [address] Rightmyer    Office [phone]  See beginning of note for Assessment and Plan.    Elige Radon , RN, BSN, CWOCN  Wound and Ostomy Care  Ascom number : [phone]

## 2022-01-04 NOTE — Unmapped (Signed)
Pt off unit for MRI

## 2022-01-04 NOTE — Unmapped (Signed)
Attempted to call Attending d/t FSBS of 71. No answer. Will call again shortly.

## 2022-01-04 NOTE — Unmapped (Signed)
Speech Language Pathology  Clinical Swallow Assessment/Tentative Discharge    Name: Michelle Ponce  DOB: [birth year]  Attending Physician: Melissa Montane, MD  Admission Diagnosis: Abdominal pain, unspecified abdominal location [R10.9]  Acute anemia [D64.9]  Date: 01/04/2022  Precautions: fall risk, aspiration precautions, allergies  Reviewed Pertinent hospital course: Yes    Hospital Course SLP: 71 y.o. female with prior ICH, history of malnutrition with a G-tube in place.  Patient's came in on 325 for altered mental status and abdominal pain and was ultimately diagnosed with a UTI however this returned culture negative.  Since going home from that encounter she is actually improved and had 3 or 4 really good days where she has been feeling better eating more and has been more awake and alert.  Today she was moaning complaining of significant abdominal pain and was rather lethargic when family came to see her at the nursing facility and so husband went and got someone to come evaluate her, they noticed that the Foley was kinked/not draining and they were able to get it to drain and she spontaneously drained 2 L.  She seems more comfortable now but still remains more lethargic than she has been the last few days.  Husband had the facility doctor evaluate her and he recommended evaluation in the emergency department.    Imaging: 1.  Central secretions most significant in the right mainstem bronchus with scattered subsegmental mucous plugging, which may related to aspiration.   2.  Small right and moderate left pleural effusions with associated passive atelectasis.   3.  Bilateral patchy consolidations, which may related to multifocal pneumonia or aspiration.   4.  Right basilar predominant smooth septal thickening, which may related to pulmonary edema.    SLP Hx: None  Assessment complete?: Yes    Assessment: Patient presents with increased risk of oropharyngeal dysphagia secondary to cognitive status, advanced age and  comorbidities. Husband reports coughing with solids and liquids for several weeks and significantly decreased po intake since initial surgery. She has been TF dependent for the last three weeks with minimal attempts with solids. Unable to complete oral motor exam d/t patient refusal. She was assessed ice chips x1 with max cues from her husband. She refused all other po trials stating she would throw them up. Patient with severe scoliosis that is impacting her positioning in bed. Oral phase WFL.No overt signs or symptoms of aspiration with ice chips. Recommend NPO diet with TF. MBS may be warranted to further evaluate pharyngeal swallow function, trial compensatory strategies and to determine specific targets for treatment, however patient with very limited intake and reduced level of alertness at this time. Will continue to assess for appropriateness.     Plan/Recommendation:   1. Diet: NPO with TF  2. PRN ice chips  3. SLP therapy frequency: minimum 2x/week while inpatient  4. SLP at discharge is recommended    Orientation:  Person: Yes  Place: Unable to assess  Time: Unable to assess  Situation: Unable to assess    Pain:  Pain Score: 0 - No Pain  Pain Location: Hip  Pain Descriptors: Sore  Pain Intervention(s): Repositioned;Emotional support  Therapist reported pain to: RN is aware and monitoring    Aspiration Risk  Risk for Aspiration: Moderate  Aspiration Risk Recommendations: Dysphagia treatment  Dysphagia Diagnosis: oral dysphagia, suspect pharyngeal dysphagia  Compensatory Swallowing Strategies: Upright as possible for all oral intake, Full supervision with meals, Alternate solids and liquids, Small bites/sips, Eat/feed slowly  Diet Solids Recommendation:  No solids, see liquids  Diet Liquids Recommendations:  (ice chips only)  Recommended Form of Meds: Feeding Tube    Any 2 Scale  Dysphonia: not present  Dysarthria: not present  Abnormal gag reflex: not present  Abnormal volitional cough: not present  Cough  after swallow: not present  Voice change after swallow: not present  Total: 0    Functional Oral Intake Scale (FOIS)  Functional Oral Intake Scale: 2-Tube dependent with minimal attempts of food or liquid    Goals  Goals Short Term Dysphagia  Patient and/or caregiver will demonstrate understanding of clinical signs and symptoms of aspiration at 80% accuracy: .  Patient and/or caregiver will demonstrate understanding of risks associated with aspiration at 100% accuracy: .  Patient will orally accept PO trials: 90% of the time.  Time frame for goals to be met in: 01/18/22  Goals Long Term Dysphagia  Patient will tolerate the least restrictive diet as determined by no overt signs of aspiration: with 90% accuracy.  Time frame for goals to be met in: 01/25/22    Recommendation  Recommendations  Recommendation: Recommend SLP therapy at discharge    Baseline Assessment  History of Intubation: No  Behavior/Cognition: Fatigued  Dentition: Adequate  General appearance of oral cavity: unable to assess  Baseline diet: dysphagia 1/puree with thin liquids, tube dependent for nutrition  Current diet: NPO  Vision: Impaired for self-feeding  Patient Positioning: Upright in bed  Volitional Swallow: WFL    Respiratory Status  Respiratory Status: Room air     Dentition and Hearing  Dentition: Adequate    Consistencies Assessed  Ice chips    Ice Chips  Presentation: Spoon  Oral: Within functional limits  Pharyngeal: Within functional limits;Laryngeal elevation appreciated upon palpation;No overt s/s of aspiration    Patient and Family Education  Patient and Family Education: Patient educated on, Family educated on, role of SLP, current POC, risks of aspiration, dysphagia, swallowing anatomy & physiology, safe swallowing behaviors, current diet recommendations  Education response: Family demonstrated understanding    Time  Start Time: 1233  Stop Time: 1248  Time Calculation (min): 15 min    Charges   $Clinical Swallow: 1 Procedure      Patient Class   Inpatient     Gershon Musselshley Caterine Mcmeans, MS, CCC-SLP  01/04/2022  Ascom# [phone]

## 2022-01-04 NOTE — Unmapped (Signed)
No significant events throughout shift. Pt started on 24h feeds per PEG tube today. Some nausea, but otherwise tolerating fairly well. Foley replaced this shift. Bed in lowest position. Soft call light under R hand (stronger side). Repositioned at 1800.

## 2022-01-04 NOTE — Unmapped (Signed)
[place] [facility]  Medical Nutrition Therapy      Reason(s) for Completion: Physician/Nursing Referral - PEG TF    Diet Order/Nutrition Support: NPO    Admit date:  01/03/2022  Admission diagnosis: Abdominal pain, unspecified abdominal location [R10.9]  Acute anemia [D64.9]  Michelle Ponce, a 71 y.o. female with PMH as below, significant for Hypertension  Hyperlipidemia, Malnutrition, Hemorrhagic CVA with left hemiparesis x9 years ago, and dysphagia on a pur??ed diet. 3 weeks ago she had a mechanical fall, on 12/14/2021 and was hospitalized at [facility]. She was found to have an intertrochanteric fracture of the left hip and underwent repair.  In addition given her severe cachexia with severe protein calorie malnutrition and hyponatremia given her poor p.o. intake a PEG tube was placed. Oral intakes were encouraged however this was limited. Her family said that she was severely constipated x8 days and only had 1 bowel movement and was again constipated when she arrived at the skilled nursing facility [place] x8 days subsequently she is now having diarrhea with multiple episodes throughout the day.    LOS 1. Nutrition consult for PEG TF received. Patient receiving nursing care at the time of visit. Hard chart reviewed. Home TF regimen is Isosource 1.5 @ 40 mL/hr continuous with 100 mL FW q6 hr. This provides 1440 kcal, 65 g protein, 169 g CHO, and 733 mL FW, and additional 400 mL FW from flushes. Per hard chart patient was on a pureed diet, however PO intakes were limited at previous admission early March. Limited weight history per chart review. Labs reviewed: Cr 0.23. No BM yet this admit, on Miralax.      There is no problem list on file for this patient.    Past Medical History:   Diagnosis Date   ??? Hypercholesteremia    ??? Hypertension    ??? Stroke (CMS-HCC)        Scheduled Meds:   ??? atorvastatin  10 mg Per G Tube Daily 0900   ??? ceFEPime (MAXIPIME) IV extended infusion  2 g  Intravenous 3 times per day   ??? enoxaparin  40 mg Subcutaneous Daily 0900   ??? esomeprazole  40 mg Per NG / OG tube DAILY 0600   ??? morphine  0.5 mg Intravenous Once   ??? polyethylene glycol  17 g Per G Tube Daily 0900   ??? vancomycin  15 mg/kg Intravenous Q12H      Continuous Infusions:  ??? sodium chloride 0.9 % 75 mL/hr (01/03/22 2042)              Pertinent Labs:   Lab Results   Component Value Date    CREATININE 0.23 (L) 01/04/2022    BUN 21 01/04/2022    NA 136 01/04/2022    K 4.1 01/04/2022    CL 101 01/04/2022    CO2 30 01/04/2022     Lab Results   Component Value Date    MG 2.1 01/04/2022    PHOS 2.5 01/04/2022     Lab Results   Component Value Date    GLUCOSE 86 01/04/2022     No results found for: PREALBUMIN  No results found for: CRP  No results found for: HGBA1C    Skin Integrity: stage III pressure ulcer to sacrum, deep tissue injury to left ischial, partial thickness wound to left hip, surgical incisions, abrasion to left buttocks  Braden Scale Score: 13    Edema:  GI:        Potential Nutrition Related Factor(s):  Chewing/Swallowing Difficulty  Food Allergies/Intolerances: NKFA  Cultural Requests: n/a    71 y.o.   Female   Ht Readings from Last 1 Encounters:   01/03/22 5' 2 (1.575 m)     Wt Readings from Last 15 Encounters:   01/03/22 114 lb 14.4 oz (52.1 kg)   12/26/21 114 lb 14.4 oz (52.1 kg)      Body mass index is 21.02 kg/m??.   Ideal Body Weight (+/- 10%) 50 kg (110 lb)  Weight History: as above    Estimated Nutrition Needs:   Needs based On: 52.1 kg CBW  Kcals/day: 1303 - 1563 (25 - 30 kcal/kg)  Protein g/day: 63 - 78 (1.2 - 1.5 gm/kg)  Carbohydrate g/day: No hx of Diabetes  Fluid ml/day: 1 ml/kcal or per MD    Nutrition Related Problems:   Nutrition Diagnosis: Altered GI Function  Related To: Dysphagia  As Evidenced By: Need for PEG TF    Recommended Interventions: Initiate Enteral Nutrition  Goals:Tolerate enteral nutrition within 2-4 days                Nutrition Status Classification:  Severely Compromised                                                  Follow up per policy while inpatient     Nutrition Transition of Care Plan: Discharge Plan of Care for nutrition (ongoing pending clinical course)    Recommendation(s):   1. EN: recommend Isosource 1.5 @ 40 mL/hr continuous (provides 1440 kcal, 65 g protein, 169 g CHO, and 733 mL FW)  2. FW per MD, consider 100 mL FW q6 hr  3. Ensure head of bed is 30-45 degrees during continuous feeding  4. Monitor TF tolerance, nutrition-related labs, weights, BMs, and plan of care.      Linden DolinIris Ashaunti Treptow, MS, RD, LD  Clinical Dietitian  [phone]

## 2022-01-04 NOTE — Unmapped (Signed)
Pt's blood sugar is 86.

## 2022-01-04 NOTE — Unmapped (Signed)
Physical Therapy  Initial Assessment     Name: Michelle Ponce  DOB: [birth year]  Attending Physician: Melissa Montane, MD  Admission Diagnosis: Abdominal pain, unspecified abdominal location [R10.9]  Acute anemia [D64.9]  Date: 01/04/2022  Reviewed Pertinent hospital course: Yes  Hospital Course PT/OT: Pt is a 71 yo female who presents with abdominal pain. CXR: patchy airspace disease. Pt sustained a mechanical fall, on 12/14/2021 and she was found to have an intertrochanteric fracture of the left hip and s/p left hip hemiarthroplasty, PEG tube placed during admission at [facility]. CT abdominal pelvis: postsurgical changes of the left hip, chronic chest wall deformity with remote rib fractures and a remote left superior pubic rami and pubic body fractures with severe right hip osteoarthritis.  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  In addition she was found to have chronic chest wall deformity with remote rib fractures and a remote left superior pubic rami and pubic body fractures with severe right hip osteoarthritis.  Precautions: Full code, fall risk, LLE FWB (per [facility] documentation)  Activity Level: Activity as tolerated  Assessment  PT 6 Clicks  Help From Another Person Turning From Back to Side While Flat in Bed Without Using Siderails: Total  Help From Another Person Moving From Lying On Back To Sitting Without Using Siderails: Total  Help From Another Person Moving To And From Bed To Chair: Total  Help From Another Person Standing Up From Chair Using Your Arms: Total  Help From Another Person To Walk In Hospital Room: Total  Help From Another Person Climbing 3-5 Steps With A Railing: Total  PT 6 Clicks Score: 6  Assessment: Impaired Bed Mobility, Impaired Transfers, Impaired Gait, Impaired Balance, Impaired Strength, Impaired ROM, Impaired Cognition, Impaired Activity Tolerance     Co-treatment indicated:  secondary to anticipated level of skilled assistance required to safely treat and mobilize patient  Prognosis: Guarded    Goals   Collaborated with: Patient  Patient Stated Goal: to improve strength  Goals to be met by: 01/11/22  Patient will transition from supine to sit: Maximum assistance  Patient will transition from sit to supine: Maximum assistance  Patient will sit edge of bed: Moderate assistance (x 5 minutes with UE support)  Miscellaneous Goal #1: Pt will tolerate OOB to the chair x 1 hour   Recommendation  Plan  Treatment/Interventions: LE strengthening/ROM, Patient/family training, Therapeutic Exercise, Therapeutic Activity, Neuromuscular Reeducation  PT Frequency: minimum 3x/week    Recommendation  Recommendation: Short-term skilled PT  Equipment Recommended: Defer to facility to obtain  Problem List  There is no problem list on file for this patient.     Past Medical History  Past Medical History:   Diagnosis Date   ??? Hypercholesteremia    ??? Hypertension    ??? Stroke (CMS-HCC)       Past Surgical History  History reviewed. No pertinent surgical history.  Home Living/Prior Function  Patient able to provide accurate information at this time: Yes, but patient is/may be a poor historian and accuracy should be confirmed  Comments: Pt has been residing at [facility]. Pt residing at home with family prior to fall and fx hip on 12/14/21. Per chart multiple prior falls. Pt states she ambulated with a walker prior to hip fx. States since then has only gotten up 1 time and is unable to stand or walk        Pain  Pain Score:  5   Pain Location: Hip  Pain Descriptors: Sore  Pain Intervention(s): Repositioned;Emotional support  Therapist reported pain to: RN is aware and monitoring    Vision  Vision/Perception  Overall Vision/ Perception: Unable to assess     Cognition  Overall Cognitive Status: Impaired  Cognitive Assessment: Arousal/ Alertness;Orientation Level;Behavior;Following Commands;Safety  Judgment;Insight;Memory  Arousal/Alertness: Fluctuating alertness  Orientation Level: Oriented to person;Oriented to place  Behavior: Distracted;Flat affect  Following Commands: Requires verbal cues;Requires tactile cues;Requires repetition of instruction;Requires increased time (inconsistently follows one step commands)  Safety Judgment: Decreased awareness of safety precautions;Decreased awareness of need for assistance;Decreased safety awareness  Insight: Demonstrated decreased insight into limitations and abilities to complete ADLs safely    Neuromuscular  Overall Sensation: Unable to assess          Lower Extremity  Lower Extremity  LE Assessment:  (L LE decreased knee flexion ROM - Only able to passively flex approx 40* - increased tone note with attempts to passively flex.)  Strength RLE  R Hip ABduction: 2-/5  R Hip ADduction: 2-/5  R Knee Flexion: 2-/5  R Knee Extension: 2+/5  R Ankle Dorsiflexion: 2+/5  R Ankle Plantar Flexion: 2-/5  Strength LLE  L Hip ABduction: 1/5  L Hip ADduction: 1/5  L Knee Flexion: 2-/5  L Knee Extension: 1/5  L Ankle Dorsiflexion: 0/5  L Ankle Plantar Flexion: 0/5  Functional Mobility  Bed Mobility   Rolling: Total assistance  Supine to Sit: Total assistance;of 2 people  Sit to Supine: Total assistance;of 2 people  Balance  Sitting - Static: Maximum Assistance (Pt with scoliotic spine and decreased cervical ROM, head in flexion and R laterally side bent. Worked on upright head control and more neutral alignment. Pt only able to maintain for a few seconds. Pt needing assist to position UEs to assist with balance)   Standing-Static: Not Applicable  Standing-Dynamic: Not Applicable         Position after Therapy and Safety Handoff  Position after treatment and safety handoff  Position after therapy session: Bed  Details: RN notified;Call light/ needs within reach;in left side-lying  Alarms: Bed  Alarms Status: Activated and Interfaced with call system      Patient Education   role of  PT  Bed mobility  Use of UEs with sitting balance  Head positioning    Time  Start Time: 0857  Stop Time: 0927  Time Calculation (min): 30 min    Charges   $PT Evaluation Mod Complex 30 Min: 1 Procedure      CPT codes  A. Personal factors and/or Comorbidities / Patient History that impacts plan of care:  See above PMH / PSH / and problem list.   High Complexity: 3 or more      B. An examination of body systems(s) musculoskeletal, neuromuscular, cardiovascular/pumonary, integumentary using standardized tests and measures:  Refer to above examination for details.     High Complexity: 4 or more elements    C. A clinical presentation with:   Moderate Complexity: Evolving with Changing Characteristics       D. Clinical decision making using standardized patient assessment instrument and/or measurable assessment of functional outcome.   Moderate Complexity [address]      [place] PT, [id number]  [phone]  01/04/2022

## 2022-01-04 NOTE — Unmapped (Signed)
Patient admitted to unit from ED. Admission and shift assessment completed with the help of family at bedside. Patient lethargic, does respond to command. Alert&oriented x3 with noted confusion. Patient's  POC reviewed with family. All questions answered. Call light within reach. Tylenol administered for c/o pain to back

## 2022-01-04 NOTE — Unmapped (Signed)
Pt back to room from MRI, noted IV to right hand dislodged, dry dressing applied, will cont to monitor.

## 2022-01-04 NOTE — Unmapped (Signed)
Palliative Care Social Work Assessment    January 04, 2022  Michelle Ponce   [id number]      Reason for Consultation to Palliative Care:  Goals of care discussion     Support System Information: pt's spouse, Michelle, and son Michelle Ponce, are both very supportive.  Pt's spouse has been her primary caregiver for the last 3 years.      Primary Contact Name & Phone Number:     Next of Kin: Michelle Ponce  Next of Kin Relationship:  Spouse  Next of Kin Phone Number: #[phone]               Advance Directives:    Healthcare Power of Attorney? No. Plan unable to complete at this time.  Spouse would be [name]     Healthcare Agent Appointed: No          Living Will? No. Plan unable to complete at this time.  Spouse is [employer]           Coping Status:  Coping with some difficulty       Assessment: Patient is a 71 y.o. White or Caucasian female admitted for AMS, abdominal pain, anemia, urinary retention requiring foley placment.     Pt is from [place], where she has been for the last 3 weeks, after a fall that resulted in a hip fx that was surgically repaired.    Palliative care team (MD, chaplain, and this SW) met with pt and spouse, and son, Michelle Ponce, on the phone, to introduce team, provide support and initiate conversations about goals of care.      Spouse and son shared medical journey that brought pt to where she is now (stroke, hip surgery, PEG tube placement and dependence on tube feedings).  Spouse and son shared that, although pt was declining, she was up with a walker until her last fall where she broke her hip.  Pt has not bounced back after surgery, and they feel she has had a drastic change since the PEG tube was placed.      Pt was resting with her eyes closed during conversation.      When asked, spouse shared that the major goal would be for pt to get strong enough to return home with spouse.  Spouse shared that he knows pt will need to return to [facility] [place] to continue her short term rehab, but remains hopeful  that she will recover enough to be able to return home.      Pall care MD introduced the topic of code status with pt's spouse and son.  Spouse shared that he would want everything done, including CPR and intubation, to keep her alive.  Pall care explained question was asked to ensure that we (care team) honor what pt/family 's wishes are .      Pt's spouse and son denied any other questions at this time.  Pall care team explained would follow for ongoing support while pt is at [facility].      Needs: Care Coordination    Goals Identified: life prolonging and rehabilitative     Follow up Plan (Specific Plan, Time Frame, Involved Parties): pall care will continue to follow for support and ongoing next steps conversations.     Patient/Family aware and taking part in the discharge plan. Discussed post-acute provider options as applicable to the discharge plan and insurance provider. Patient and family are aware that they have the freedom to choose providers and financial interest(s) were disclosed  as appropriate.    This assessment has been reviewed with the multi-disciplinary team.    Michelle MamMelissa Jayjay Ponce, LSW   [phone]

## 2022-01-04 NOTE — Unmapped (Signed)
Morphine not administered. Pt finally slept, no hollering and moaning. Repositioned for comfort. Call light within reach

## 2022-01-04 NOTE — Unmapped (Signed)
Consult completed

## 2022-01-05 ENCOUNTER — Inpatient Hospital Stay: Admit: 2022-01-05 | Payer: MEDICARE

## 2022-01-05 LAB — BASIC METABOLIC PANEL
Anion Gap: 5 mmol/L (ref 3–16)
BUN: 22 mg/dL (ref 7–25)
CO2: 28 mmol/L (ref 21–33)
Calcium: 8.2 mg/dL (ref 8.6–10.3)
Chloride: 104 mmol/L (ref 98–110)
Creatinine: 0.3 mg/dL (ref 0.60–1.30)
EGFR: >90
Glucose: 133 mg/dL (ref 70–100)
Osmolality, Calculated: 289 mOsm/kg (ref 278–305)
Potassium: 3.9 mmol/L (ref 3.5–5.3)
Sodium: 137 mmol/L (ref 133–146)

## 2022-01-05 LAB — CBC
Hematocrit: 27.1 % (ref 35.0–45.0)
Hemoglobin: 9.5 g/dL (ref 11.7–15.5)
MCH: 35.6 pg (ref 27.0–33.0)
MCHC: 35 g/dL (ref 32.0–36.0)
MCV: 101.8 fL (ref 80.0–100.0)
MPV: 6.9 fL (ref 7.5–11.5)
Platelets: 400 10*3/uL (ref 140–400)
RBC: 2.66 10*6/uL (ref 3.80–5.10)
RDW: 12.2 % (ref 11.0–15.0)
WBC: 7.7 10*3/uL (ref 3.8–10.8)

## 2022-01-05 LAB — POC GLU MONITORING DEVICE
POC Glucose Monitoring Device: 108 mg/dL — ABNORMAL HIGH (ref 70–100)
POC Glucose Monitoring Device: 141 mg/dL — ABNORMAL HIGH (ref 70–100)

## 2022-01-05 MED ORDER — Lactobacillus rhamnosus GG (CULTURELLE) CpSP 1 capsule
15 | Freq: Every day | ORAL | Status: AC
Start: 2022-01-05 — End: 2022-01-06
  Administered 2022-01-05 – 2022-01-06 (×2): 1 via ORAL

## 2022-01-05 MED ORDER — mirtazapineREMERONtablet15mg
15 | Freq: Every evening | ORAL | Status: AC
Start: 2022-01-05 — End: 2022-01-06
  Administered 2022-01-06: 01:00:00 15 mg via ORAL

## 2022-01-05 MED ORDER — S.M.O.G. enema (RECTAL USE ONLY)
Freq: Once | Status: AC
Start: 2022-01-05 — End: 2022-01-05
  Administered 2022-01-05: 17:00:00 400 mL via RECTAL

## 2022-01-05 MED FILL — CEFEPIME 2 GRAM SOLUTION FOR INJECTION: 2 2 gram | INTRAMUSCULAR | Qty: 1

## 2022-01-05 MED FILL — MIRTAZAPINE 15 MG TABLET: 15 15 MG | ORAL | Qty: 1

## 2022-01-05 MED FILL — ATORVASTATIN 10 MG TABLET: 10 10 MG | ORAL | Qty: 1

## 2022-01-05 MED FILL — VANCOMYCIN 750 MG INTRAVENOUS SOLUTION: 750 750 mg | INTRAVENOUS | Qty: 750

## 2022-01-05 MED FILL — NEXIUM 40 MG CAPSULE,DELAYED RELEASE: 40 40 mg | ORAL | Qty: 1

## 2022-01-05 MED FILL — CULTURELLE 15 BILLION CELL SPRINKLE CAPSULE: 15 15 billion cell | ORAL | Qty: 1

## 2022-01-05 MED FILL — S.M.O.G. ENEMA 400 ML: 400.00 400.00 mL | Qty: 400

## 2022-01-05 MED FILL — POLYETHYLENE GLYCOL 3350 17 GRAM ORAL POWDER PACKET: 17 17 gram | ORAL | Qty: 1

## 2022-01-05 MED FILL — ENOXAPARIN 40 MG/0.4 ML SUBCUTANEOUS SYRINGE: 40 40 mg/0.4 mL | SUBCUTANEOUS | Qty: 0.4

## 2022-01-05 NOTE — Unmapped (Signed)
Physical Therapy                                                    Co-treatment    Pt requires skilled assist x2 therapists to address EOB/ OOB mobility and functional tasks to maximize function and address sitting balance, head position, hand positioning etc.       Name: Michelle Ponce  DOB: 23-Dec-1950  Attending Physician: Melissa Montane, MD  Admission Diagnosis: Abdominal pain, unspecified abdominal location [R10.9]  Acute anemia [D64.9]  Date: 01/05/2022  Reviewed Pertinent hospital course: Yes  Hospital Course PT/OT: Pt is a 71 yo female who presents with abdominal pain. CXR: patchy airspace disease. Pt sustained a mechanical fall, on 12/14/2021 and she was found to have an intertrochanteric fracture of the left hip and s/p left hip hemiarthroplasty, PEG tube placed during admission at [facility]. CT abdominal pelvis: postsurgical changes of the left hip, chronic chest wall deformity with remote rib fractures and a remote left superior pubic rami and pubic body fractures with severe right hip osteoarthritis.  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  In addition she was found to have chronic chest wall deformity with remote rib fractures and a remote left superior pubic rami and pubic body fractures with severe right hip osteoarthritis.  Precautions: Full code, fall risk, LLE FWB (per [facility] documentation) - unable to verify approach for hip replacement - assuming posterior hip precautions at this time  Activity Level: Activity as tolerated    Assessment  PT 6 Clicks  Help From Another Person Turning From Back to Side While Flat in Bed Without Using Siderails: Total  Help From Another Person Moving From Lying On Back To Sitting Without Using Siderails: Total  Help From Another Person Moving To And From Bed To Chair: Total  Help From Another Person Standing Up From Chair Using Your Arms: Total  Help From Another Person  To Walk In Hospital Room: Total  Help From Another Person Climbing 3-5 Steps With A Railing: Total  PT 6 Clicks Score: 6  Assessment: Impaired Bed Mobility, Impaired Transfers, Impaired Gait, Impaired Balance, Impaired Strength, Impaired ROM, Impaired Cognition, Impaired Activity Tolerance        Prognosis: Guarded      Goals  Collaborated with: Patient  Patient Stated Goal: to improve strength  Goals to be met by: 01/11/22  Patient will transition from supine to sit: Maximum assistance  Patient will transition from sit to supine: Maximum assistance  Patient will sit edge of bed: Moderate assistance (x 5 minutes with UE support)  Miscellaneous Goal #1: Pt will tolerate OOB to the chair x 1 hour    Recommendation  Plan  Treatment/Interventions: LE strengthening/ROM, Patient/family training, Therapeutic Exercise, Therapeutic Activity, Neuromuscular Reeducation  PT Frequency: minimum 3x/week    Recommendation  Recommendation: Short-term skilled PT  Equipment Recommended: Defer to facility to obtain    Problem List  Patient Active Problem List   Diagnosis   ??? Disorientation   ??? Aspiration into respiratory tract   ??? Moderate protein-calorie malnutrition (CMS-HCC)        Past Medical History  Past Medical History:   Diagnosis Date   ??? Hypercholesteremia    ??? Hypertension    ??? Stroke (CMS-HCC)  Past Surgical History  History reviewed. No pertinent surgical history.    Cognition:  Overall Cognitive Status: Impaired  Arousal/Alertness: Fluctuating alertness  Orientation Level: Oriented to person;Oriented to place  Behavior: Apprehensive (LIstless)  Following Commands: Follows one step commands;Requires repetition of instruction;Requires verbal cues;Requires tactile cues;Requires increased time     Pain:  Pain Score: 3   Pain Location: Generalized  Pain Descriptors: Discomfort  Pain Intervention(s): Repositioned  Therapist reported pain to: RN is monitoring         Mobility:  Bed Mobility  Rolling: Maximum assistance  (worked on rolling to be cleaned and changed after bowel incontinence. Hand over hand assist to reach for the rail and for LE positioning. Pillow placed between legs for R Rolling to prevent hip ADD (presumed posterior hip precautions - unable to verify))  Supine to Sit: Total assistance;of 2 people  Sit to Supine: Total assistance;of 2 people  Unable to progress further due to: Pt unable to tolerate any OOB activity  Balance  Sitting - Static: Maximum assistance (Pt leans to L partially due to scoliosis. Worked on static sit balance, trunk strengthening, cervical strength and ROM. Pt is very weak and has a difficult time maintaining an upright head position. Pt began looking more lethargic therefore returned to supine)  Standing - Static: Not Applicable  Standing - Dynamic: Not Applicable      Exercise:  Supine  Exercises: Heel slides;Short arc quads;ABduction/ADduction;5 reps (and R DF and PF (DF assisted to full ROM))  Reps/Comments: AAROM B LEs. Pt fatigues very easily and not tolerating more than 5 reps each at this time      Position after Treatment and Safety Handoff  Position after therapy session: Bed  Details: in left side-lying;RN notified;Call light/ needs within reach  Alarms: Bed  Alarms Status: Activated and Interfaced with call system           Patient Education  LE therex and importance of trying to move LEs  Bed mobility  Head positioning with sitting  UE use to assist with sitting balance      Time  Start Time: 1112  Stop Time: 1159  Time Calculation (min): 47 min    Charges       $Therapeutic Exercise: 8-22 mins  $Therapeutic Activity: 2 units           Stuartori Demetreus Lothamer PT, [phone]  [phone]  01/05/2022

## 2022-01-05 NOTE — Unmapped (Signed)
Palliative Care   Follow Up Note    PATIENT NAME: Michelle Ponce  DATE OF BIRTH: August 21, 1951  ATTENDING:   PCP: Alden Server, MD  MRN#: [id number]  ADMIT DATE: 01/03/2022  LOS: 1  DATE OF SERVICE: 01/05/2022    ASSESSMENT AND RECOMMENDATIONS / PLAN   Michelle Ponce is a 71 y.o. female with a history of HTN, HLD, CVA (c/b L hemiparesis and malnutrition) admitted 01/03/2022 with AMS.  Palliative care consulted for goals of care.    1. Goals of Care / Treatment Preferences related to CVA c/b dysphagia s/p PEG and severe protein calorie malnutrition:    Did not address today    --Code status: Full Code  --Preferred Disposition: TBD         2. Symptoms of distress:   Anorexia:  Poor PO intake since recent hip fracture ~3 weeks ago.  Associated with hypoalbuminemia and noted severe anasarca on recent imaging.  Constipation could be contributing with large stool burden on CT scan.  Discussed with primary team who shared concern for depression as a potential contributor.   Appetitie may be the result of general decline, debility and progression of disease.  In this case, likely not to respond to appetite stimulants.  Dependent on tube feeds.    Can try Remeron 15 mg qHS given concurrent depression.  Can titrate by 15 mg every week to effect.       3. Psychosocial/Spiritual:   Care Provided: care/communication coordination and therapeutic/empathic listening  Palliative care interdisciplinary team will continue to follow for support.    4. Advance Directives/Surrogate Decision Maker:  Advance Directives:Patient does not have advance directive  Advance Directives Type:   Agent/DPOA:    Agent/DPOA Phone:      Next of YNW:GNFA Michelle Ponce  Next of Kin relationship: Spouse  Next of Kin Phone:#[phone]    SUBJECTIVE   Chief complaint / Reason for followup:  Discuss complex medical decision making related to goals of care    Interval Update/Last 24h:  Patient evaluated by SLP and recommending NPO with TF.  Hypoglycemia noted overnight, home  TF ordered.         OBJECTIVE     Last Vital Signs:  BP 125/65 (BP Location: Left arm, Patient Position: Lying)    Pulse 99    Temp 98.5 ??F (36.9 ??C) (Axillary)    Resp 17    Ht 5' 2 (1.575 m)    Wt (!) 96 lb 1.9 oz (43.6 kg)    SpO2 92%    BMI 17.58 kg/m??     Intake/Output:    Intake/Output Summary (Last 24 hours) at 01/05/2022 0825  Last data filed at 01/05/2022 0537  Gross per 24 hour   Intake 727 ml   Output 800 ml   Net -73 ml       Physical Exam:  General Appearance: chronically ill appearing; cachectic;   HEENT:lips, gums, tongue, oral mucosa, teeth normal and intact  Pulmonary:normal work of breathing and no tachypnea, retractions or cyanosis  Cardiovascular:regular rate and rhythm  ON:GEXBMWUX; PEG Noted  Musculoskeletal:no lower or upper extremity edema  Skin:warm, dry, normal color, without lesions or rash    Labs Personally Reviewed:     Lab Results   Component Value Date/Time    WBC 7.7 01/05/2022 04:42 AM    RBC 2.66 (L) 01/05/2022 04:42 AM    HCT 27.1 (L) 01/05/2022 04:42 AM    PLT 400 01/05/2022 04:42 AM      Lab  Results   Component Value Date/Time    NA 137 01/05/2022 04:42 AM    K 3.9 01/05/2022 04:42 AM    CL 104 01/05/2022 04:42 AM    CO2 28 01/05/2022 04:42 AM    BUN 22 01/05/2022 04:42 AM    CREATININE 0.30 (L) 01/05/2022 04:42 AM    GLUCOSE 133 (H) 01/05/2022 04:42 AM    CALCIUM 8.2 (L) 01/05/2022 04:42 AM     Imaging Personally Reviewed:     CT ABDOMEN AND PELVIS WITH IV CONTRAST 01/04/2022 - IMPRESSION:   1. ??Severe anasarca including a small volume of ascites with moderate left and small right pleural effusions   2. ??Large stool burden   3. ??Left greater than right pelvic varices. Recommend correlation with any signs or symptoms of pelvic congestion syndrome.     MRI HEAD W AND WO CONTRAST 01/04/2022 - IMPRESSION:   1. Foci and confluent areas T2/FLAIR hyperintensity with lacunar change, and multifocal susceptibility, consistent with chronic small vessel disease.   2. Remote right putamen/corona  radiata hematoma cavity   3. Small focus diffusion restriction is splenium corpus callosum.    Documents/Notes Personally Reviewed:  Medicine and nursing notes    Management Plan Personally Discussed with:  Dr. Thedore Mins    _______________________________________________    The palliative care team appreciates the opportunity to be involved in the care of this patient. For any questions or concerns, please page the palliative care team via [facility] or the operator.    I attest that I spent 60 minutes for today's visit performing the following activities: Chart review, Obtaining/Reviewing History, Communicating with primary team and/or consultants on the patient's care plan, Documenting in the Patient's Medical Record, Providing active listening and support related to serious or life-limiting illness and Care Coordination.      Electronically signed by:  Azzie Glatter, DO  01/05/2022

## 2022-01-05 NOTE — Unmapped (Signed)
Occupational Therapy  Co-treatment     Name: Michelle Ponce  DOB: June 11, 1951  Attending Physician: Melissa Montane, MD  Admission Diagnosis: Abdominal pain, unspecified abdominal location [R10.9]  Acute anemia [D64.9]  Date: 01/05/2022  Reviewed Pertinent hospital course: Yes   Hospital Course PT/OT: Pt is a 71 yo female who presents with abdominal pain. CXR: patchy airspace disease. Pt sustained a mechanical fall, on 12/14/2021 and she was found to have an intertrochanteric fracture of the left hip and s/p left hip hemiarthroplasty, PEG tube placed during admission at [facility]. CT abdominal pelvis: postsurgical changes of the left hip, chronic chest wall deformity with remote rib fractures and a remote left superior pubic rami and pubic body fractures with severe right hip osteoarthritis.  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  In addition she was found to have chronic chest wall deformity with remote rib fractures and a remote left superior pubic rami and pubic body fractures with severe right hip osteoarthritis.  Precautions: Full code, fall risk, LLE FWB (per [facility] documentation) - unable to verify approach for hip replacement - assuming posterior hip precautions at this time  Activity Level: Activity as tolerated      Assessment  OT 6 Clicks  Help From Another Person To Put On/Take Off Regular Lower Body Clothing: Total  Help From Another Person Bathing ( including washing ,rinsing,drying): Total  Help From Another Person Toileting, which includes using the toilet, bedpan or urinal: Total  Help From Another Person To Put On/Take Off Regular Upper Body Clothing: Total  Help From Another Person Taking Care Of Personal Grooming: Total  Help From Another Person Eating Meals: Total  OT 6 Clicks Score: 6  Assessment: Decreased ADL status, Decreased UE strength, Decreased Safe judgment during ADL, Decreased cognition,  Decreased self-care transfers, Decreased UE ROM, Decreased activity tolerance, Decreased Functional Mobility, Decreased Balance          Co-treatment with PT; secondary to anticipated level of skilled assistance required to safely treat and mobilize patient.             Prognosis for OT goals: Guarded (given significant deconditioning)                                       Goals  Goals to be met in: one week, by 01/11/22  Patient stated goal: patient unable to state  Patient will complete supine to sit in prep for ADLs: Maximum assistance (assist x2)  Patient will complete grooming task: Maximum assistance  Patient will complete upper body dressing: Maximum assistance  Pt Will participate in upper extremity HEP to prep for ADLs: .  Miscellaneous Goal #1: Pt will tolerate sitting EOB x2 minutes with Mod A for sitting balance during ADLs/functional activity.  Miscellaneous Goal #2: Pt will perform bed mobility with mod A for rolling R/L for hygiene purposes.  Collaborated with: Patient    Recommendation  Plan  Treatment Interventions: ADL retraining, Functional transfer training, UE strengthening/ROM, Therapeutic Activity, Compensatory technique education, Patient/Family training, Cognitive reorientation, Neuro muscular reeducation, Activity Tolerance training  Progress: Slow progress, decreased activity tolerance  OT Frequency: minimum 3x/week  Recommendation  Recommendation: Short-term skilled OT  Equipment Recommendations: Defer at this time    Problem List  Patient Active Problem List   Diagnosis   ??? Disorientation   ??? Aspiration  into respiratory tract   ??? Moderate protein-calorie malnutrition (CMS-HCC)        Past Medical History  Past Medical History:   Diagnosis Date   ??? Hypercholesteremia    ??? Hypertension    ??? Stroke (CMS-HCC)         Past Surgical History  History reviewed. No pertinent surgical history.      Cognition  Overall Cognitive Status: Impaired  Arousal/Alertness: Fluctuating alertness  Orientation  Level: Oriented to person;Oriented to place  Behavior: Apprehensive (LIstless)  Following Commands: Follows one step commands;Requires repetition of instruction;Requires verbal cues;Requires tactile cues;Requires increased time    Pain  Pain Score: 3   Pain Location: Generalized  Pain Descriptors: Discomfort  Pain Intervention(s): Repositioned  Therapist reported pain to: RN is monitoring     Mobility  Bed Mobility  Rolling: Maximum assistance (worked on rolling to be cleaned and changed after bowel incontinence. Hand over hand assist to reach for the rail and for LE positioning. Pillow placed between legs for R Rolling to prevent hip ADD (presumed posterior hip precautions - unable to verify))  Supine to Sit: Total assistance;of 2 people  Sit to Supine: Total assistance;of 2 people  Unable to progress further due to: Pt unable to tolerate any OOB activity  Balance  Sitting - Static: Maximum assistance (Pt leans to L partially due to scoliosis. Worked on static sit balance, trunk strengthening, cervical strength and ROM. Pt is very weak and has a difficult time maintaining an upright head position. Pt began looking more lethargic therefore returned to supine)  Standing - Static: Not Applicable  Standing - Dynamic: Not Applicable       ADL  Grooming: Total assistance  Grooming Deficit: Wash/dry face  Location Assessed Grooming: Seated edge of bed  Lower Body Dressing: Total assistance  Lower Body Dressing Deficit: Don/doff L sock;Don/doff R sock  Location Assessed LE Dressing: Supine in bed  Toileting: Total assistance (foley)  Toileting Deficit: Verbal cueing;Supervison/safety;Increased time to complete;Toileting hygiene;incontinent of bowel/bladder  Location Assessed Toileting: Supine in bed  Toileting Deficit Additional Comments: pt incontinent of stool and required total assist for hygiene         Position after Treatment and Safety Handoff  Position after therapy session: Bed  Details: RN notified;Call light/  needs within reach (PT remained at b/s working with pt)      Patient Education  Pt. educated on the role of OT/POC, functional transfers, d/c recommendations and safety with ADLs. Pt demo fair understanding of education and requires moderate to maximal reinforcement for improved comprehension.         Time  Start Time: 1113  Stop Time: 1143  Time Calculation (min): 30 min    Charges       $Therapeutic Activity: 23-37 mins        Cordie Grice, OTR/L  License #: [id number]  Ascom: [phone]  01/05/2022

## 2022-01-05 NOTE — Unmapped (Signed)
[place]  Palliative Care Spiritual Assessment    PATIENT NAME: Michelle Ponce  DATE OF BIRTH: 05-13-1951    PCP: Alden Server, MD  MRN#: [id number]  [id number]  ADMIT DATE: 01/03/2022  LOS: 1  DATE OF SERVICE: 01/05/2022    Family contact today: none    Interventions: Palliative Care Team visited pt per palliative care practice. Pt did her best to stay awake and try to talk. Pt responded appropriately to doctor's questions and stated how she was feeling. Pt asked chaplain to pray for her and chaplain prayed as requested. No other needs were presented. Chaplain will continue to follow and assess ongoing spiritual care needs.    Spiritual Assessment:  Religious Affiliation:Christian               Community: family    Address/ Action in Care (Plan):           Advances Directives/Decision Making:      Current Code Status: Full Code      Advance Directives:Patient does not have advance directive            Advance Directives Type:       Surrogate Decision Maker:       Surrogate relationship:       Surrogate Phone:          Cultural/Spiritual History:  Specific spiritual/ cultural preferences: Ephriam Knuckles  Anticipating grief/ loss issues identified: none  Risk for complicated grief: low        Barnes Florek  12:59 PM      Thank you for allowing the Palliative Care Team to participate in the care of this patient. Please call the Palliative Care Services for any questions or concerns:  [phone]. For after hours and weekends [phone] and the operator will page the on-call provider.

## 2022-01-05 NOTE — Unmapped (Signed)
Problem: Potential for Compromised Skin Integrity  Goal: Skin integrity is maintained or improved  Description: Assess and monitor skin integrity. Identify patients at risk for skin breakdown on admission and per policy. Collaborate with interdisciplinary team and initiate plans and interventions as needed.  Outcome: Progressing  Goal: Nutritional status is improving  Description: Monitor and assess patient for malnutrition (ex- brittle hair, bruises, dry skin, pale skin and conjunctiva, muscle wasting, smooth red tongue, and disorientation). Collaborate with interdisciplinary team and initiate plan and interventions as ordered.  Monitor patient's weight and dietary intake as ordered or per policy. Utilize nutrition screening tool and intervene per policy. Determine patient's food preferences and provide high-protein, high-caloric foods as appropriate.   Outcome: Progressing     Problem: Safety  Goal: Patient will be injury free during hospitalization  Description: Assess and monitor vitals signs, neurological status including level of consciousness and orientation. Assess patient's risk for falls and implement fall prevention plan of care and interventions per hospital policy.      Ensure arm band on, uncluttered walking paths in room, adequate room lighting, call light and overbed table within reach, bed in low position, wheels locked, side rails up per policy, and non-skid footwear provided.   Outcome: Progressing  Goal: Patient with weight > 350lbs will have appropriate equipment  Description: Consider ordering Bariatric Bed, Chair and Bedside Commode for patient weight > 350 lbs.  Outcome: Progressing     Problem: Patient will remain free of falls  Goal: Universal Fall Precautions  Outcome: Progressing

## 2022-01-05 NOTE — Unmapped (Addendum)
[place]  DEPARTMENT OF FAMILY MEDICINE  DAILY PROGRESS NOTE         Chief Complaint / Reason for Follow-Up     the patient Michelle Ponce is a 71 y.o. female who presents for   Chief Complaint   Patient presents with   ??? Abdominal Pain        Interval History / Subjective      Overnight:Some nausea overnight, BG down (71,86), started on 24 hr tube feeds via PEG, BG up to 141 this AM    Consults:  -Nutrition-tube feeds restarted with FW boluses q4   -SLP: NPO with TF, PRN chips    Changes in labs/Imaging:  - CT AB and MRI completed,        Past Medical History:   Diagnosis Date   ??? Hypercholesteremia    ??? Hypertension    ??? Stroke (CMS-HCC)          Review of Systems      Patient endorses: abdominal pain, not feeling well   Patient Denies:  vomiting, fever, chest pain, SOB    Medications     Current Facility-Administered Medications   Medication Dose Route Frequency Provider Last Rate Last Admin   ??? acetaminophen (TYLENOL) oral solution Soln 650 mg  650 mg Per G Tube Q4H PRN Alvina Filbert, MD   650 mg at 01/04/22 0858   ??? atorvastatin (LIPITOR) tablet 10 mg  10 mg Per G Tube Daily 0900 Clarisa Schools, DO   10 mg at 01/05/22 1610   ??? ceFEPIme (MAXIPIME) 2 g in sodium chloride 0.9% 100 mL IVPB  2 g Intravenous Q12H Lashann Hagg, MD 25 mL/hr at 01/05/22 0421 2 g at 01/05/22 0421   ??? enoxaparin (LOVENOX) for prophylaxis syringe 40 mg/0.4 mL  40 mg Subcutaneous Daily 0900 Clarisa Schools, DO   40 mg at 01/05/22 0840   ??? esomeprazole (NEXIUM) 40 mg capsule FOR FEEDING TUBE  40 mg Per NG / OG tube DAILY 0600 Clarisa Schools, DO   40 mg at 01/05/22 0534   ??? magnesium hydroxide (MILK OF MAGNESIA) 2,400 mg/10 mL oral suspension 10 mL  10 mL Oral Daily PRN Clarisa Schools, DO       ??? ondansetron (ZOFRAN) injection 4 mg  4 mg Intravenous Q4H PRN Clarisa Schools, DO   4 mg at 01/04/22 1809   ??? polyethylene glycol (MIRALAX) packet 17 g  17 g Per G Tube Daily 0900 Brad Evans, DO   17 g at 01/05/22 0840   ??? sodium chloride 0.9 % infusion  75 mL/hr  Intravenous Continuous Alvina Filbert, MD 75 mL/hr at 01/04/22 1506 75 mL/hr at 01/04/22 1506   ??? vancomycin (VANCOCIN) 750 mg in sodium chloride 0.9% 250 mL ADDaptor IVPB  15 mg/kg Intravenous Q12H Alvina Filbert, MD 250 mL/hr at 01/05/22 0450 750 mg at 01/05/22 0450       Vital Signs      Vitals:    01/05/22 0749   BP: 125/65   Pulse: 99   Resp: 17   Temp: 98.5 ??F (36.9 ??C)   SpO2: 92%           Physical Exam     General: Patient alert, resting comfortably in bed, weepy regarding current state of health   Heent/neck: NCAT, EOM intact, sclera non-ictari. Neck soft, supple, warm  Cv: HRRR, S1S2, no murmur appreciated  Resp:CTA b/l no wheezes, rales, ronchi  GI: Moderate distension and diffuse tenderness  RUE:AVWUJW palpable b/l,  warm, no edema, SCD on, left leg with wrapping   Neuro: AOx3, conversational, responds to commands   Psych: Appropriate affect, good eye contact, weepy        Laboratory Data       Lab Results   Component Value Date    WBC 7.7 01/05/2022    HGB 9.5 (L) 01/05/2022    HCT 27.1 (L) 01/05/2022    MCV 101.8 (H) 01/05/2022    PLT 400 01/05/2022     Lab Results   Component Value Date    CREATININE 0.30 (L) 01/05/2022    BUN 22 01/05/2022    NA 137 01/05/2022    K 3.9 01/05/2022    CL 104 01/05/2022    CO2 28 01/05/2022     Lab Results   Component Value Date    POCGMD 141 (H) 01/05/2022                Diagnostic Studies      CT Abdomen and Pelvis WO IV contrast    Result Date: 01/03/2022  IMPRESSION: CHEST 1.  Central secretions most significant in the right mainstem bronchus with scattered subsegmental mucous plugging, which may related to aspiration. 2.  Small right and moderate left pleural effusions with associated passive atelectasis. 3.  Bilateral patchy consolidations, which may related to multifocal pneumonia or aspiration. 4.  Right basilar predominant smooth septal thickening, which may related to pulmonary edema. ABDOMEN/PELVIS 1.  Small volume ascites, otherwise no acute abnormality of the  abdomen and pelvis identified within the limits of a noncontrast exam. Approved by Carey BullocksEvelyn Guerra, MD on 01/03/2022 9:46 PM EDT I have personally reviewed the images and I agree with this report. Report Verified by: Zane Heraldarl Flink, MD at 01/03/2022 9:57 PM EDT    X-ray Abdomen AP view    Result Date: 12/26/2021  IMPRESSION:  Nonspecific bowel gas pattern with moderate colonic stool burden. Report Verified by: William Hamburgerasey Reed, MD at 12/26/2021 1:41 PM EDT    CT Chest WO contrast    Result Date: 01/03/2022  IMPRESSION: CHEST 1.  Central secretions most significant in the right mainstem bronchus with scattered subsegmental mucous plugging, which may related to aspiration. 2.  Small right and moderate left pleural effusions with associated passive atelectasis. 3.  Bilateral patchy consolidations, which may related to multifocal pneumonia or aspiration. 4.  Right basilar predominant smooth septal thickening, which may related to pulmonary edema. ABDOMEN/PELVIS 1.  Small volume ascites, otherwise no acute abnormality of the abdomen and pelvis identified within the limits of a noncontrast exam. Approved by Carey BullocksEvelyn Guerra, MD on 01/03/2022 9:46 PM EDT I have personally reviewed the images and I agree with this report. Report Verified by: Zane Heraldarl Flink, MD at 01/03/2022 9:57 PM EDT    MRI Head W WO contrast    Result Date: 01/05/2022  IMPRESSION: 1. Foci and confluent areas T2/FLAIR hyperintensity with lacunar change, and multifocal susceptibility, consistent with chronic small vessel disease. 2. Remote right putamen/corona radiata hematoma cavity 3. Small focus diffusion restriction is splenium corpus callosum. Report Verified by: Burt Ekhomas Tomsick, MD at 01/05/2022 8:35 AM EDT    CT Abdomen and Pelvis With IV contrast    Result Date: 01/04/2022  IMPRESSION: 1.  Severe anasarca including a small volume of ascites with moderate left and small right pleural effusions 2.  Large stool burden 3.  Left greater than right pelvic varices. Recommend correlation with  any signs or symptoms of pelvic congestion syndrome. Report Verified by: Annett FabianJuliana Tobler,  MD at 01/04/2022 4:56 PM EDT    CT Abdomen and Pelvis With IV contrast    Result Date: 12/26/2021  IMPRESSION: 1.  No acute findings in the abdomen or pelvis. 2.  Soft tissue edema and gas surrounding the left hip, likely related to recent hemiarthroplasty. 3.  Incidental 1.3 cm right adrenal nodule, likely an adenoma. Consider comparison with prior imaging or 12 month follow up as clinically indicated. 4.  Calcified atherosclerosis causing mild to moderate stenosis of the bilateral renal arteries and SMA ostium. I have personally reviewed the images and I agree with this report. Report Verified by: William Hamburger, MD at 12/26/2021 3:45 PM EDT    X-ray Portable Chest    Result Date: 01/03/2022  IMPRESSION: Patchy airspace disease greatest at the left lung base which may reflect aspiration or pneumonia. Report Verified by: Zane Herald, MD at 01/03/2022 4:06 PM EDT         Assessment & Plan       Michelle Ponce is a 71 y.o. with a history of CVA w residual hemiparesis, urinary retention who is admitted for abdominal pain and AMS.     #AMS concern for acute metabolic encephalopathy - improving   Patient with improved alertness and mentation today, MRI with no clear etiology at this time and no positive culture results to date. Wound care following  -empiric vanc cefepime - no clear etiology at this time   -Blood cultures/ sputum cultures pending  -CT w/ pneumonia vs aspiration   -Urine cultures pending   ??  ??  #abdominal pain  bladder scan on 4/2 with , Ct w and wo contrast with small volume ascites, Ct w contrast with anasarca, ascites, large stool burden and possible pelvic congestion syndrome. Pain possibly due to pelvic congestion, constipation and ascites, unclear etiology at this time     -Consider smog enema for constipation- does have hx of reported diarrhea x1 week ago- potentially due to overflow diarrhea ?  -Consider echo for  concern of pelvis varices and anasarca?  -Discontinue NS infusion now tube feeds have restarted  ??  #Anemia-improving  most likely iron deficiency although MCV elevated with normal B12 and folate   -Trend hemoglobin  -Hb improved from 8.6-9.5    ??  ??  # HLD - continue Lipitor 10 mg via Peg, no acute concerns at this time   ??  #HTN  -No concerns at this time   ??  #Urinary retention requiring Foley placement  Bladder scan in ED w/o sign of retention, continue to monitor foley output and readjust as needed  - holding home Claritin   -Urine cultures pending   ??  #Hx of CVA w/ residual hemiparesis   -SLP eval order, NPO at this time with ice chips, on pureed diet at home   ??  #Pain  -Tylenol PRN  ??  ??  #Hx of malnurtition w/ PEG placement   Nutrition  Consulted for tube feeds  -palliative acre consulted-GOC discussed with family  -Back on 24 hr tube feeds with FW boluses  -d/c IV fluids     Orders Placed This Encounter   Procedures   ??? Diet NPO past midnight Except for: no exceptions     DVT prophylaxis: Enoxaparin (prophylaxis)  Code Status: Full Code   Lines/Drains- PEG, Foley     Dispo planning- anticipated d/c 1-2 days? Need social work for help with dispo planning and rehab placement     Patient Active Problem List  Diagnosis   ??? Disorientation   ??? Aspiration into respiratory tract   ??? Moderate protein-calorie malnutrition (CMS-HCC)         Loyola Mast, MS IV  01/05/2022 1021      ATTESTATION  Patient seen with the medical student and examined on this date  D/w medical student: agree with evaluation  My summary:  1. AMS: has improved pat 2d, likely ac met encep; no s/o new CVA on MRI; des have chronic small vessel changes;   2. pna w aspiration: d/w SLP; swallow is better than I expected; ongoing BSA: await blood and urine cultures  3. Recent UTI: atbx as above; await urine culture; foley: no blood present  4. abd pain: CT abd noted: pelvic congestion, anasarca and stool ++; smog enema  5. Anasarca: possibly from  protein-cal malnutrition; TF resumed yesterday; ck TTE  6. Depressed affect: trial with remeron, may help appetite/oral intake p we get final rec from SLP      Fluids, Electrolytes and Nutrition:   Diet: TF ongoing, await final rec from SLP; iv fluids: dc; Continue to monitor renal labs  DVT prophylaxis:   lovenox  Discharge planning:    inpatient status; cultures should be back tomorrow and we can possibly look at dc back to SNF; Appreciate input from pall care; for now remains full code    35 mins    Melissa Montane, MD, FAAFP   Associate Professor of [facility] [place])   [facility]

## 2022-01-05 NOTE — Unmapped (Signed)
Problem: Safety  Goal: Patient will be injury free during hospitalization  Description: Assess and monitor vitals signs, neurological status including level of consciousness and orientation. Assess patient's risk for falls and implement fall prevention plan of care and interventions per hospital policy.      Ensure arm band on, uncluttered walking paths in room, adequate room lighting, call light and overbed table within reach, bed in low position, wheels locked, side rails up per policy, and non-skid footwear provided.   Outcome: Progressing  Goal: Patient with weight > 350lbs will have appropriate equipment  Description: Consider ordering Bariatric Bed, Chair and Bedside Commode for patient weight > 350 lbs.  Outcome: Progressing     Problem: Patient will remain free of falls  Goal: Universal Fall Precautions  Outcome: Progressing     Problem: Potential for Compromised Skin Integrity  Goal: Skin integrity is maintained or improved  Description: Assess and monitor skin integrity. Identify patients at risk for skin breakdown on admission and per policy. Collaborate with interdisciplinary team and initiate plans and interventions as needed.  Outcome: Progressing  Goal: Nutritional status is improving  Description: Monitor and assess patient for malnutrition (ex- brittle hair, bruises, dry skin, pale skin and conjunctiva, muscle wasting, smooth red tongue, and disorientation). Collaborate with interdisciplinary team and initiate plan and interventions as ordered.  Monitor patient's weight and dietary intake as ordered or per policy. Utilize nutrition screening tool and intervene per policy. Determine patient's food preferences and provide high-protein, high-caloric foods as appropriate.   Outcome: Progressing     Problem: Patient will remain free of falls  Goal: Universal Fall Precautions  Outcome: Progressing     Problem: Acute Pain  Description: Patient's pain progressing toward patient's stated pain goal  Goal:  Patient displays improved well-being such as baseline levels for pulse, BP, respirations and relaxed muscle tone or body posture  Outcome: Progressing  Goal: Patient will manage pain with the appropriate technique/intervention  Description: Assess and monitor patient's pain using appropriate pain scale. Collaborate with interdisciplinary team and initiate plan and interventions as ordered.  Re-assess patient's pain level 30-60 minutes after pain management intervention.  Outcome: Progressing  Goal: Patient will reduce or eliminate use of analgesics  Outcome: Progressing  Goal: Patients pain is managed to allow active participation in daily activities  Outcome: Progressing  Goal: Patient verbalizes a reduction in pain level  Outcome: Progressing

## 2022-01-05 NOTE — Unmapped (Signed)
Pt with NAE this shift; VSS. A&O x 2; Pt turned q2h, side-to-side in bed; tolerated fairly well. All LDA's remain C/D/I. Pt NPO; TF; voiding adequately. Safety precautions are in place at this time, personal belongings and call light within reach, bed alarm on. Pt states needs and concerns are being met at this time.

## 2022-01-05 NOTE — Unmapped (Signed)
Speech Language Pathology  Dysphagia Progress Note  Tentative discharge         Name: Michelle Ponce  DOB: [birth year]  Attending Physician:  Montane, MD  Admission Diagnosis: Abdominal pain, unspecified abdominal location [R10.9]  Acute anemia [D64.9]  Date: 01/05/2022  Precautions: aspiration, food allergies (pt reported she is allergic to chocolate, fall risks)   Reviewed Pertinent hospital course: Yes  Hospital Course SLP: 71 y.o. female with prior ICH, history of malnutrition with a G-tube in place.  Patient's came in on 325 for altered mental status and abdominal pain and was ultimately diagnosed with a UTI however this returned culture negative.  Since going home from that encounter she is actually improved and had 3 or 4 really good days where she has been feeling better eating more and has been more awake and alert.  Today she was moaning complaining of significant abdominal pain and was rather lethargic when family came to see her at the nursing facility and so husband went and got someone to come evaluate her, they noticed that the Foley was kinked/not draining and they were able to get it to drain and she spontaneously drained 2 L.  She seems more comfortable now but still remains more lethargic than she has been the last few days.  Husband had the facility doctor evaluate her and he recommended evaluation in the emergency department.  Imaging: 1.  Central secretions most significant in the right mainstem bronchus with scattered subsegmental mucous plugging, which may related to aspiration.   2.  Small right and moderate left pleural effusions with associated passive atelectasis.   3.  Bilateral patchy consolidations, which may related to multifocal pneumonia or aspiration.   4.  Right basilar predominant smooth septal thickening, which may related to pulmonary edema.  SLP Hx: None  Assessment complete?: Yes     Assessment 01/04/2022: Patient presents with increased risk of oropharyngeal dysphagia secondary  to cognitive status, advanced age and comorbidities. Husband reports coughing with solids and liquids for several weeks and significantly decreased po intake since initial surgery. She has been TF dependent for the last three weeks with minimal attempts with solids. Unable to complete oral motor exam d/t patient refusal. She was assessed ice chips x1 with max cues from her husband. She refused all other po trials stating she would throw them up. Patient with severe scoliosis that is impacting her positioning in bed. Oral phase WFL.No overt signs or symptoms of aspiration with ice chips. Recommend NPO diet with TF. MBS may be warranted to further evaluate pharyngeal swallow function, trial compensatory strategies and to determine specific targets for treatment, however patient with very limited intake and reduced level of alertness at this time. Will continue to assess for appropriateness.   This date 01/05/2022:  Pt more alert.  Pt reported she has not been eating since this weekend due to nausea and vomiting.  Pt positioned upright.  Pt was assessed with 1/2 tsp of pudding x 8, ice chips x 4, and 20 sips of water.  The oral and pharyngeal stages of the swallow were WFL's. No overt signs of aspiration.  Limited PO intake due to pt's fear of emesis.  Pt was left upright.  Concerns for aspiration due to recent emesis.    ??  Plan/Recommendation:   1. Diet: NPO with TF  2. PRN ice chips and sips of water  3. SLP therapy frequency: minimum 2x/week while inpatient  4. SLP at discharge is recommended  Subjective:  Alert and Oriented x3. Pain: denies pain.  Pt reported she was afraid of vomiting.  Pt reported she was allergic to chocolate causing tachycardia.  Alerted Dr. Thedore MinsSingh and RN.  No family present.  Pt needed max assistance with positioning and feeding.  Pt on room air.  Pt has PEG-tube with cont tube feeds.      Objective:  Goals Short Term Dysphagia  Patient and/or caregiver will demonstrate understanding of  clinical signs and symptoms of aspiration at 80% accuracy: .  >> in progress: education completed with pt in regards to aspiration risks due to weakness, positioning, and recent emesis.  Pt was resistant to answering follow-up questions.  Cont ed.   Patient and/or caregiver will demonstrate understanding of risks associated with aspiration at 100% accuracy: .  See above goal.   Patient will orally accept PO trials: 90% of the time.  >>in progress-  Pt refusing oral care, but reported it has been addressed today.  Pt accepted trials of ice chips, water, and pudding with max encouragement.  Cont goal for ongoing assessment of PO.   Time frame for goals to be met in: 01/18/22  Goals Long Term Dysphagia  Patient will tolerate the least restrictive diet as determined by no overt signs of aspiration: with 90% accuracy.  >> pt receiving cont tube feeds.  No overt signs of aspiration, but pt is fearful of eating at this time.  Cont to recommend ice chips and water.  Ongoing assessment recommended before diet advancement.    Time frame for goals to be met in: 01/25/22    Education:  Patient educated on role of SLP, current POC, and discharge recommendations for SLP therapy.  Patient educated on swallowing anatomy & physiology, dysphagia, risks of aspiration, current diet recommendations, and safe swallowing behaviors.   Patient demonstrates understanding with cueing.       Patient was left Semi-reclined in bed with call light within reach and all needs met.  Safety handoff completed with RN/Jayme who verbalized understanding. Also discussed with Dr. Thedore MinsSingh.     If patient discharges prior to next therapy treatment session, this note will serve as the discharge. Goals as above. All goals were not met d/t discharge prior to meeting goals. No equipment issued. Education as indicated above.          Time  Start Time: 1005  Stop Time: 1038  Time Calculation (min): 33 min    Charges      $Swallow Tx : 1 Procedure     Fara BorosMelissa  Carmin Dibartolo, M.S. CCC/SLP   Speech Therapist  01/05/2022

## 2022-01-05 NOTE — Unmapped (Signed)
Problem: Knowledge Deficit  Goal: Patient/family/caregiver demonstrates understanding of disease process, treatment plan, medications, and discharge instructions  Description: Complete learning assessment and assess knowledge base.  Outcome: Progressing     Problem: Potential for Compromised Skin Integrity  Goal: Skin integrity is maintained or improved  Description: Assess and monitor skin integrity. Identify patients at risk for skin breakdown on admission and per policy. Collaborate with interdisciplinary team and initiate plans and interventions as needed.  Outcome: Progressing  Goal: Nutritional status is improving  Description: Monitor and assess patient for malnutrition (ex- brittle hair, bruises, dry skin, pale skin and conjunctiva, muscle wasting, smooth red tongue, and disorientation). Collaborate with interdisciplinary team and initiate plan and interventions as ordered.  Monitor patient's weight and dietary intake as ordered or per policy. Utilize nutrition screening tool and intervene per policy. Determine patient's food preferences and provide high-protein, high-caloric foods as appropriate.   Outcome: Progressing     Problem: Incontinence  Goal: Perineal skin integrity is maintained or improved  Description: Assess genitourinary system, perineal skin, labs (urinalysis), and history of incontinence to include past management, aggravating, and alleviating factors.  Collaborate with interdisciplinary team and initiate plans and interventions as needed.  Outcome: Progressing     Problem: Safety  Goal: Patient will be injury free during hospitalization  Description: Assess and monitor vitals signs, neurological status including level of consciousness and orientation. Assess patient's risk for falls and implement fall prevention plan of care and interventions per hospital policy.      Ensure arm band on, uncluttered walking paths in room, adequate room lighting, call light and overbed table within reach,  bed in low position, wheels locked, side rails up per policy, and non-skid footwear provided.   Outcome: Progressing  Goal: Patient with weight > 350lbs will have appropriate equipment  Description: Consider ordering Bariatric Bed, Chair and Bedside Commode for patient weight > 350 lbs.  Outcome: Progressing     Problem: Patient will remain free of falls  Goal: Universal Fall Precautions  Outcome: Progressing     Problem: Daily Care  Goal: Daily care needs are met  Description: Assess and monitor ability to perform self care and identify potential discharge needs.  Outcome: Progressing     Problem: Psychosocial Needs  Goal: Demonstrates ability to cope with hospitalization/illness  Description: Assess and monitor patients ability to cope with his/her illness.  Outcome: Progressing  Goal: Collaborate with patient/family to identify patient's goals  Outcome: Progressing     Problem: Discharge Barriers  Goal: Patient's discharge needs are met  Description: Collaborate with interdisciplinary team and initiate plans and interventions as needed.   Outcome: Progressing

## 2022-01-06 LAB — RENAL FUNCTION PANEL W/EGFR
Albumin: 2.3 g/dL (ref 3.5–5.7)
Anion Gap: 5 mmol/L (ref 3–16)
BUN: 19 mg/dL (ref 7–25)
CO2: 28 mmol/L (ref 21–33)
Calcium: 8 mg/dL (ref 8.6–10.3)
Chloride: 105 mmol/L (ref 98–110)
Creatinine: 0.28 mg/dL (ref 0.60–1.30)
EGFR: >90
Glucose: 147 mg/dL (ref 70–100)
Osmolality, Calculated: 291 mOsm/kg (ref 278–305)
Phosphorus: 1.9 mg/dL (ref 2.1–4.5)
Potassium: 3.9 mmol/L (ref 3.5–5.3)
Sodium: 138 mmol/L (ref 133–146)

## 2022-01-06 LAB — CBC
Hematocrit: 25.8 % — ABNORMAL LOW (ref 35.0–45.0)
Hemoglobin: 9 g/dL — ABNORMAL LOW (ref 11.7–15.5)
MCH: 35.4 pg — ABNORMAL HIGH (ref 27.0–33.0)
MCHC: 35.1 g/dL (ref 32.0–36.0)
MCV: 100.9 fL — ABNORMAL HIGH (ref 80.0–100.0)
MPV: 6.6 fL — ABNORMAL LOW (ref 7.5–11.5)
Platelets: 381 10*3/uL (ref 140–400)
RBC: 2.55 10*6/uL — ABNORMAL LOW (ref 3.80–5.10)
RDW: 12.3 % (ref 11.0–15.0)
WBC: 4.2 10*3/uL (ref 3.8–10.8)

## 2022-01-06 LAB — POC GLU MONITORING DEVICE: POC Glucose Monitoring Device: 137 mg/dL — ABNORMAL HIGH (ref 70–100)

## 2022-01-06 LAB — VANCOMYCIN, TROUGH: Vancomycin Tr: 10.2 ug/mL (ref 10.0–20.0)

## 2022-01-06 MED ORDER — polyethylene glycol (MIRALAX) 17 gram packet
17 | PACK | Freq: Every day | ORAL | 0 refills | Status: AC
Start: 2022-01-06 — End: ?

## 2022-01-06 MED ORDER — vancomycin (VANCOCIN) 750 mg in sodium chloride 0.9% 250 mL ADDaptor IVPB
Freq: Three times a day (TID) | INTRAVENOUS | Status: AC
Start: 2022-01-06 — End: 2022-01-06

## 2022-01-06 MED ORDER — traMADoL (ULTRAM) 50 mg tablet
50 | ORAL_TABLET | Freq: Four times a day (QID) | ORAL | 0 refills | Status: AC | PRN
Start: 2022-01-06 — End: 2022-01-11

## 2022-01-06 MED ORDER — mirtazapine (REMERON) 15 MG tablet
15 | ORAL_TABLET | Freq: Every evening | ORAL | 0 refills | Status: AC
Start: 2022-01-06 — End: ?
  Filled 2022-01-06: qty 30, 30d supply, fill #0

## 2022-01-06 MED ORDER — sodium phosphate 20 mmol in sodium chloride 0.9 % 250 mL infusion
3 | Freq: Once | INTRAVENOUS | Status: AC
Start: 2022-01-06 — End: 2022-01-06
  Administered 2022-01-06: 13:00:00 20 mmol via INTRAVENOUS

## 2022-01-06 MED FILL — NEXIUM 40 MG CAPSULE,DELAYED RELEASE: 40 40 mg | ORAL | Qty: 1

## 2022-01-06 MED FILL — ATORVASTATIN 10 MG TABLET: 10 10 MG | ORAL | Qty: 1

## 2022-01-06 MED FILL — POLYETHYLENE GLYCOL 3350 17 GRAM ORAL POWDER PACKET: 17 17 gram | ORAL | Qty: 1

## 2022-01-06 MED FILL — VANCOMYCIN 750 MG INTRAVENOUS SOLUTION: 750 750 mg | INTRAVENOUS | Qty: 750

## 2022-01-06 MED FILL — CULTURELLE 15 BILLION CELL SPRINKLE CAPSULE: 15 15 billion cell | ORAL | Qty: 1

## 2022-01-06 MED FILL — ACETAMINOPHEN 650 MG/20.3 ML ORAL SOLUTION: 650 650 mg/20.3 mL | ORAL | Qty: 20.3

## 2022-01-06 MED FILL — CEFEPIME 2 GRAM SOLUTION FOR INJECTION: 2 2 gram | INTRAMUSCULAR | Qty: 1

## 2022-01-06 MED FILL — ENOXAPARIN 40 MG/0.4 ML SUBCUTANEOUS SYRINGE: 40 40 mg/0.4 mL | SUBCUTANEOUS | Qty: 0.4

## 2022-01-06 MED FILL — SODIUM PHOSPHATE 3 MMOL/ML INTRAVENOUS SOLUTION: 3 3 mmol/mL | INTRAVENOUS | Qty: 6.67

## 2022-01-06 NOTE — Unmapped (Signed)
[facility]  DEPARTMENT OF FAMILY MEDICINE  DAILY PROGRESS NOTE         Chief Complaint / Reason for Follow-Up     the patient Michelle Ponce is a 71 y.o. female who presents for   Chief Complaint   Patient presents with   ??? Abdominal Pain       Interval History / Subjective      OVN: NAOE, BM x2 yesterday after enema, family called wityh update on 4/4 PM, family inquiring about possible cancer?    Subjective: I don't feel well, everything hurts    Significant lab/imaginng changes: Echo completed yesterday, non contributory     Consult:  -Palliative;Can try Remeron 15 mg qHS given concurrent depression.  Can titrate by 15 mg every week to effect.     -Nutrition: continue home TF schedule   -SLP: NPO with TF PRN ice chips and sips of water     Past Medical History:   Diagnosis Date   ??? Hypercholesteremia    ??? Hypertension    ??? Stroke (CMS-HCC)          Review of Systems      Patient endorses: abdominal pain, pain everywhere, general not feeling well  Patient Denies: chest pain, SOB, cough       Medications     Current Facility-Administered Medications   Medication Dose Route Frequency Provider Last Rate Last Admin   ??? acetaminophen (TYLENOL) oral solution Soln 650 mg  650 mg Per G Tube Q4H PRN Alvina Filbert, MD   650 mg at 01/06/22 0854   ??? atorvastatin (LIPITOR) tablet 10 mg  10 mg Per G Tube Daily 0900 Michelle Schools, DO   10 mg at 01/06/22 0855   ??? ceFEPIme (MAXIPIME) 2 g in sodium chloride 0.9% 100 mL IVPB  2 g Intravenous Q12H Michelle Crance, MD 25 mL/hr at 01/06/22 0350 2 g at 01/06/22 0350   ??? enoxaparin (LOVENOX) for prophylaxis syringe 40 mg/0.4 mL  40 mg Subcutaneous Daily 0900 Michelle Schools, DO   40 mg at 01/06/22 0854   ??? esomeprazole (NEXIUM) 40 mg capsule FOR FEEDING TUBE  40 mg Per NG / OG tube DAILY 0600 Michelle Schools, DO   40 mg at 01/06/22 0609   ??? Lactobacillus rhamnosus GG (CULTURELLE) CpSP 1 capsule  1 capsule Oral Daily 0900 Michelle Montane, MD   1 capsule at 01/06/22 0855   ??? magnesium hydroxide (MILK OF  MAGNESIA) 2,400 mg/10 mL oral suspension 10 mL  10 mL Oral Daily PRN Michelle Schools, DO       ??? mirtazapine (REMERON) tablet 15 mg  15 mg Oral Nightly (2100) Michelle Mcjunkins, MD   15 mg at 01/05/22 2129   ??? ondansetron (ZOFRAN) injection 4 mg  4 mg Intravenous Q4H PRN Michelle Schools, DO   4 mg at 01/04/22 1809   ??? polyethylene glycol (MIRALAX) packet 17 g  17 g Per G Tube Daily 0900 Michelle Evans, DO   17 g at 01/06/22 0856   ??? sodium phosphate 20 mmol in sodium chloride 0.9 % 250 mL infusion  20 mmol Intravenous Once Michelle Celestin, MD 62.5 mL/hr at 01/06/22 0912 20 mmol at 01/06/22 0912   ??? vancomycin (VANCOCIN) 750 mg in sodium chloride 0.9% 250 mL ADDaptor IVPB  15 mg/kg Intravenous Q8H Michelle Gatliff, MD           Vital Signs      Vitals:    01/06/22 0735   BP: 150/88  Pulse: 83   Resp: 18   Temp: 97.5 ??F (36.4 ??C)   SpO2: 95%           Physical Exam     General: Patient alert, resting in bed, tired, difficult to rouse  Heent/neck: NCAT, Neck soft, supple, warm  Cv: HRRR, S1S2, no murmur appreciated   Resp:CTA b/l no wheezes, rales, ronchi  GI: hyper tymapnic, distended, no masses appreciated   Ext:Pulses palpable b/l, warm, no edema  Neuro: AOx4, able to follow some commands  Psych: depressed affect, tired        Laboratory Data       Lab Results   Component Value Date  WGN:FAOZHY  WBC 4.2 01/06/2022    HGB 9.0 (L) 01/06/2022    HCT 25.8 (L) 01/06/2022    MCV 100.9 (H) 01/06/2022    PLT 381 01/06/2022         Lab Results   Component Value Date    POCGMD 137 (H) 01/06/2022      0 Result Notes        Component Ref Range & Units 01/06/22 ??4:20 AM Comments   Sodium 133 - 146 mmol/L 138     Potassium 3.5 - 5.3 mmol/L 3.9     Chloride 98 - 110 mmol/L 105     CO2 21 - 33 mmol/L 28     Anion Gap 3 - 16 mmol/L 5     BUN 7 - 25 mg/dL 19     Creatinine 8.650.60 - 1.30 mg/dL 7.840.28??Low??     Glucose 70 - 100 mg/dL 696147??High??     Calcium 8.6 - 10.3 mg/dL 8.0??Low??     Phosphorus 2.1 - 4.5 mg/dL 1.9??Low??     Albumin 3.5 - 5.7 g/dL 2.3??Low??     Osmolality,  Calculated 278 - 305 mOsm/kg 291     EGFR  >90               Diagnostic Studies       Echo 2D Complete (TTE)    Result Date: 01/05/2022                            Briann Chalk* [facility]                      Department of Cardiology*                        [address]                       [place], [place] [id number]                            [phone] Transthoracic Echocardiography Patient:    Michelle Ponce, Michelle Ponce MR #:       [id number] Account: Study Date: 01/05/2022 Gender:     F Age:        [age] DOB:        [birth year] [facility]  REFERRING    Michelle MinsSingh,  PERFORMING   [facility], [facility]  SONOGRAPHER  Cheryll Cockayneucker, Stephanie  ORDERING     Valera Vallas K  Ollen BowlONSULTING   Ponce, Michelle A  ATTENDING    Michelle MinsSingh, Deran Barro K  Floy SabinaADMITTING    Ponce, Michelle A -------------------------------------------------------------------  Procedure:ECHO 2D COMPLETE (TTE)              Order: Accession Number:[id number] ------------------------------------------------------------------- Indications:      (I42.9 Cardiomyopathy- unspecified). ------------------------------------------------------------------- PMH:   Cerebrovascular accident.  Risk factors:  Hypertension.  ------------------------------------------------------------------- Study data:  Height: 62in. 157.5cm. Weight: 95.8lb. 43.5kg.  Study status:  Routine.  Procedure:  Transthoracic echocardiography. Image quality was good. Scanning was performed from the parasternal, apical, and subcostal acoustic windows. Intravenous contrast (Optison) was administered.          Transthoracic echocardiography.  M-mode, complete 2D, complete spectral Doppler, and color Doppler.  Birthdate:  Patient birthdate: [birth year]. Age:  Patient is 71yr old.  Sex:  Birth gender: female.  Body mass index:  BMI: 17.6kg/m^2.  Body surface area:    BSA: 1.32m^2.  Blood pressure:     125/65  Patient status:  Inpatient.  Study date: Study date: 01/05/2022. Study time: 12:30 PM.  Location:  Echo laboratory.  ------------------------------------------------------------------- Study Conclusions - Left ventricle: The cavity size is normal. Wall thickness was increased in a pattern of moderate   LVH. Systolic function was normal. The estimated ejection fraction was in the range of 55% to 60%.   Wall motion was normal; there were no regional wall motion abnormalities. Doppler parameters are   consistent with abnormal left ventricular relaxation (grade 1 diastolic dysfunction). - Aortic valve: Moderate regurgitation. - Mitral valve: Mildly calcified annulus. Mild thickening. Mild regurgitation. - Right ventricle: Systolic function was normal by objective interpretation. - Tricuspid valve: Mild-moderate regurgitation. - Pulmonary arteries: Systolic pressure was mildly increased, estimated to be 35mm Hg assuming that   the right atrial pressure was 3 mmHg. - Pericardium, extracardiac: A trivial pericardial effusion is identified. There is a left pleural   effusion. ------------------------------------------------------------------- Cardiac Anatomy Left ventricle: - The cavity size is normal. Wall thickness was increased in a pattern of moderate LVH. Systolic   function was normal. The estimated ejection fraction was in the range of 55% to 60%. Wall motion   was normal; there were no regional wall motion abnormalities. - Wall motion score: 1.00. - Doppler parameters are consistent with abnormal left ventricular relaxation (grade 1 diastolic   dysfunction). Aorta:   Aortic root: - The aortic root is normal in size. Aortic valve: - Trileaflet; normal thickness leaflets. Mobility was not restricted. Doppler: - Transvalvular velocity is within the normal range. There is no stenosis. Moderate regurgitation. Mitral valve: - Mildly calcified annulus. Mild thickening. Mobility was not restricted. Doppler: - Transvalvular velocity is within the normal range. There is no evidence for stenosis. Mild   regurgitation. Left atrium: - The  atrium is normal in size. Pulmonary artery: - Systolic pressure was mildly increased, estimated to be 35mm Hg assuming that the right atrial   pressure was 3 mmHg. Systemic veins: Inferior vena cava: The vessel was normal in size. Right ventricle: - The cavity size is normal. Wall thickness is normal. Systolic function was normal by objective   interpretation. Pulmonic valve:    Doppler: - Transvalvular velocity is within the normal range. There is no evidence for stenosis. No   regurgitation. Tricuspid valve: - Structurally normal valve. Doppler: - Transvalvular velocity is within the normal range. Mild-moderate regurgitation. Right atrium: - The atrium is normal in size. Pericardium: - A trivial pericardial effusion is identified. There is a left pleural effusion. Systemic veins: Inferior vena cava: - The vessel was normal in size. ------------------------------------------------------------------- Measurements  Left ventricle  Value        Ref  EDD, LAX             (L)      3.4   cm     3.8 - 5.2  ESD, LAX             (N)      2.4   cm     2.2 - 3.5  EDD/bsa, LAX         (N)      2.4   cm/m^2 2.3 - 3.1  ESD/bsa, LAX         (N)      1.7   cm/m^2 1.3 - 2.1  FS, LAX              (N)      29    %      27 - 45  FS, LAX chord        (N)      29    %      27 - 45  ESD                  (N)      2.4   cm     2.2 - 3.5  ESD/bsa              (N)      1.7   cm/m^2 1.3 - 2.1  PW, ED               (H)      1.4   cm     0.6 - 0.9  IVS/PW, ED                    1            ----------  EDV                  (N)      47    ml     46 - 106  ESV                  (N)      20    ml     14 - 42  EF                   (N)      57    %      54 - 74  EDV/bsa              (N)      34    ml/m^2 29 - 61  ESV/bsa              (N)      14    ml/m^2 8 - 24  SV, 1-p A2C                   27    ml     ----------  SV/bsa, 1-p A2C               19.4  ml/m^2 ----------  SV, 1-p A4C                   26    ml     ----------  SV/bsa, 1-p  A4C               18    ml/m^2 ----------  E', lat ann, TDI     (L)      6.9   cm/sec >=10.0  E/e', lat ann, TDI            11           ----------  E', med ann, TDI     (L)      5.0   cm/sec >=7.0  E/e', med ann, TDI            14           ----------  E', avg, TDI                  5.9   cm/sec ----------  E/e', avg, TDI       (N)      12           <=14   Ventricular septum            Value        Ref  IVS, ED              (H)      1.4   cm     0.6 - 0.9   Right ventricle               Value        Ref  TAPSE, MM            (N)      2.1   cm     1.7 - 3.1  S' lateral           (H)      16.2  cm/sec 6.0 - 13.4   Left atrium                   Value        Ref  Area ES, A4C         (N)      13    cm^2   <=20  Area ES, A2C                  15    cm^2   ----------  SI dim, A2C                   5.1   cm     ----------  Vol, ES, 1-p A4C     (N)      29    ml     22 - 52  Vol/bsa, ES, 1-p A4C (N)      21    ml/m^2 11 - 40  Vol, ES, 1-p A2C     (N)      37    ml     22 - 52  Vol/bsa, ES, 1-p A2C (N)      26    ml/m^2 13 - 40  Vol, ES, 2-p                  34    ml     ----------  Vol/bsa, ES, 2-p     (N)      25    ml/m^2 16 - 34   Right atrium                  Value        Ref  Area, ES, A4C        (L)      9  cm^2   10 - 18   Aortic valve                  Value        Ref  Peak v, S                     1.4   m/sec  ----------  AR peak v                     5.34  m/sec  ----------  AR PHT                        344   ms     ----------  AR peak grad                  114   mm Hg  ----------   Mitral valve                  Value        Ref  Peak E                        0.72  m/sec  ----------  Peak A                        0.94  m/sec  ----------  Decel time                    99    ms     ----------  Peak grad, D                  2     mm Hg  ----------  Peak E/A ratio                0.8          ----------   Pulmonic valve                Value        Ref  Peak v, S                     0.8   m/sec  ----------    Tricuspid valve               Value        Ref  TR peak v            (N)      2.8   m/sec  <=2.8  Peak RV-RA grad, S            31    mm Hg  ----------  Max TR vel                    2.05  m/sec  ----------  Legend: (L)  and  (H)  mark values outside specified reference range. (N)  marks values inside specified reference range. Reviewed and confirmed by Lionel December 2023-04-04T16:28:49    Assessment & Plan       Michelle Ponce is a 71 y.o. with a history of CVA w residual hemiparesis, urinary retention who is admitted for abdominal pain and AMS.     #Disorientation-Improving  Patient mentation improving, blood and urine cultures negative, remains on empiric vanc and cef abx. Mri mostly noncontribuatory. Cause of alteration not  known at this time potentially 2/2 pneumonia vs delirium vs abdominal pain? CT chest indicative of aspiration vs pneunomia  -MRSA nares pending   -continue ABX  delirium precautions    #Abdominal pain of unclear etiology   Probably multifactorial, enema and probiotics given yesterday for constipation. Possible pelvic congestion syndrome? Ascites and ansarca is unlikely to be cardiac in origin given noncontribuatory echo and normal liver appearane with normal LFTS, most likely 2/2 malnutrition/ hypo albumin causing fluid displaement. Patient reports persistent pain despite successful enema yesterday.  -continue with TF and probiotics for nutritional support  -continue bowel reg of Miralax and milk of mag  -Continue Zofran   -continue nexium     #Malnutrition   Albumin low. Peg tube on home TF. Nutrition and SLP following for nutritional support   -NPO w sips and Ice Prn   -TF  -Remeron added for appetite support and possible depression    -Palliative care following     #Anemia-stable  ??most likely iron deficiency although MCV elevated with normal B12 and folate   -Trend hemoglobin  -Hb stable around 8-9  ??    # HLD??- continue Lipitor 10 mg via Peg, no acute concerns at this time   ??  #HTN  -No  concerns at this time   ??  #Urinary retention requiring Foley placement  Bladder scan in ED w/o sign of retention, continue to monitor foley output and readjust as needed  - holding home Claritin   -Urine cultures pending   ??  #Hx of CVA w/ residual hemiparesis   -SLP eval order, NPO at this time with ice chips, on pureed diet at home   ??  #Pain  -Tylenol PRN    Orders Placed This Encounter   Procedures   ??? Diet NPO effective now Except for: except ice chips, other (see comments) Sips of water     DVT prophylaxis: Enoxaparin (prophylaxis)  Code Status: Full Code   Family- Will update today   Dispo planning: Plan on return back to rehab facility pending MRSA results    Patient Active Problem List   Diagnosis   ??? Disorientation   ??? Aspiration into respiratory tract   ??? Moderate protein-calorie malnutrition (CMS-HCC)                 Loyola Mast, MS IV  01/06/2022 944    ATTESTATION  Patient seen with the medical student and examined on this date  D/w medical student: agree with evaluation  My summary:  1. AMS: very weak/debilitated but alert and oriented  2. Aspiration w ?pna: doing well w/o fever, cough or inc fio2 requirements; BC neg; can dc iv atbx  3. abd pain: likely from bloating and stool retention; abd softer but still distended/hypertympanic w normal BS; had a very good result w smog enema; cont bowel reg w miralax  4. Anasarca: likely r/t nutritional status; TTE was better than expected, no sys dysfxn and only Gr1 DD  5. Depression: started mirtaz yesterday      Fluids, Electrolytes and Nutrition:   Diet: TF; iv fluids: na; iv phos x 1  DVT prophylaxis:    lovenox  Discharge planning:    seems to be medically stable for dc; dc to SNF later today? shd have pall care consult p dc as well    35 Ponce    Michelle Montane, MD, FAAFP   Associate Professor of Family Medicine [facility] [place])   [facility]

## 2022-01-06 NOTE — Unmapped (Addendum)
[facility]    Case Manager/Social Worker Discharge Summary     Patient name: Michelle MonicaDeborah Ponce                                        Patient MRN: [id number]  DOB: 10/03/1951                              Age: 71 y.o.              Gender: female  Patient emergency contact: Extended Emergency Contact Information  Primary Emergency Contact: Garringer,[name]  Mobile Phone: [phone]  Relation: Spouse  Secondary Emergency Contact: Sessler,aaron  Mobile Phone: [phone]  Relation: Son  Interpreter needed? No      Attending provider: Melissa MontaneManoj Singh, MD  Primary care physician: Alden ServerPaul G Jentes, MD    The MD has indicated that the patient is ready for discharge.  Michelle Ponce was referred and accepted at [facility]: [phone].  The patient will be transported by [facility] [phone] at 1345.    Transfer Mode/Level of Care: Theatre stage managerBasic Life Support Stretcher (BLS)    PASRR/HENS 7000 Completed: N/A (return to facility)    AVS and COC have been faxed to facility. Dr. Thedore MinsSingh [facility] CM confirmed with [facility] that this [facility] okay. Gen is aware of discharge for today and confirmed bed availability.    CM met with patient and spoke with patient's spouse on phone to update them with this information. They both remain in agreement with this discharge plan.    The plan has been reviewed:     Patient/Family Informed of Discharge Plan: Yes    Plan Reviewed With Patient, Family, or Significant Other: Yes    Patient and or family are aware and in agreement with the discharge plan: Yes             Plan reviewed with MD and other members of the health care team: Yes  Care Plan Completed: Yes    No further CM/SW needs.    Jocelyn LamerPam Damacio Weisgerber RN, CM  [phone]    This plan has been reviewed with the multi-disciplinary team.     Treatment Preferences    Treatment Preferences: Other (provide comment), Distance    Post-Discharge Goals    Patient's  Post-Discharge goals: to get better    Post Acute Care Provider Information:  Skilled Nursing Facility Name[facility]   SNF Number: [phone]

## 2022-01-06 NOTE — Unmapped (Signed)
CONTINUITY OF CARE FORM     Patient name: Michelle Ponce  Patient MRN: [id number]  DOB: 15-Jun-1951  Age: 71 y.o.  Gender: female    Date of admission: 01/03/2022  Date of discharge: 01/06/2022    Attending provider: Melissa Montane, MD  Primary care physician: Alden Server, MD    Code status: Full Code  Allergies:   Allergies   Allergen Reactions   ??? Aspirin    ??? Atenolol    ??? Codeine    ??? Methylprednisone    ??? Sudafed [Pseudoephedrine Hcl]        Diagnoses Present on Admission   Primary Diagnosis: Disorientation  Discharge Diagnosis :   Active Hospital Problems    Diagnosis Date Noted   ??? Disorientation [R41.0] 01/05/2022   ??? Aspiration into respiratory tract [T17.908A] 01/05/2022   ??? Moderate protein-calorie malnutrition (CMS-HCC) [E44.0] 01/05/2022      Resolved Hospital Problems   No resolved problems to display.     Prognosis: fair  Rehabilitation potential: fair        Diet     Diet Orders          Diet NPO effective now Except for: except ice chips, other (see comments) Sips of water starting at 04/04 1800    Feeding tube water bolus starting at 04/03 1600    Continuous tube feedings starting at 04/03 1420    Dietary nutrition supplements starting at 04/02 1954        Dysphagia Assessment and Recommendations (when available): Risk for Aspiration: Moderate  Aspiration Risk Recommendations: Dysphagia treatment  Dysphagia Diagnosis: oral dysphagia, suspect pharyngeal dysphagia  Compensatory Swallowing Strategies: Upright as possible for all oral intake, Full supervision with meals, Alternate solids and liquids, Small bites/sips, Eat/feed slowly  Diet Solids Recommendation: No solids, see liquids  Diet Liquids Recommendations:  (ice chips only)  Recommended Form of Meds: Feeding Tube  Dysphagia Diet Recommended (when available): Diet Solids Recommendation: No solids, see liquids  Diet Liquids Recommendations:  (ice chips only)    As listed above    Services Required   Skilled Nursing: Yes    PT Interventions and  Frequency: Treatment/Interventions: LE strengthening/ROM, Patient/family training, Therapeutic Exercise, Therapeutic Activity, Neuromuscular Reeducation  PT Frequency: minimum 3x/week    PT Recommendations: Recommendation: Short-term skilled PT  Equipment Recommended: Defer to facility to obtain    OT Interventions and Frequency: Treatment Interventions: ADL retraining, Functional transfer training, UE strengthening/ROM, Therapeutic Activity, Compensatory technique education, Patient/Family training, Cognitive reorientation, Neuro muscular reeducation, Activity Tolerance training  Progress: Slow progress, decreased activity tolerance  OT Frequency: minimum 3x/week    OT Recommendations: Recommendation: Short-term skilled OT  Equipment Recommendations: Defer at this time    Weight bearing status:  full    Bedside Swallow Recommendations (when available): Recommendation: Recommend SLP therapy at discharge    Speech Language Recommendations (when available):      Needs 24 hour supervision due to cognitive impairment: No    Discharge Medications   Medications:     Medication List      TAKE these medications, which are NEW      Quantity/Refills   mirtazapine 15 MG tablet  Commonly known as: REMERON  Take 1 tablet (15 mg total) by mouth at bedtime.   Quantity: 30 tablet  Refills: 0     polyethylene glycol 17 gram packet  Commonly known as: MIRALAX  17 g by Per G Tube route daily.  Start taking on: January 07, 2022  Quantity: 14 packet  Refills: 0        TAKE these medications, which you were ALREADY TAKING      Quantity/Refills   atorvastatin 10 MG tablet  Commonly known as: LIPITOR  1 tablet (10 mg total) by Per G Tube route daily.   Refills: 0     enoxaparin 40 mg/0.4 mL Syrg  Commonly known as: LOVENOX  Inject 0.4 mLs (40 mg total) subcutaneously daily.   Refills: 0     loratadine 10 mg tablet  Commonly known as: CLARITIN  1 tablet (10 mg total) by Per G Tube route daily.   Refills: 0     pantoprazole 20 MG  tablet  Commonly known as: PROTONIX  1 tablet (20 mg total) every morning before breakfast.   Refills: 0     traMADoL 50 mg tablet  Commonly known as: ULTRAM  1 tablet (50 mg total) by Per G Tube route every 6 hours as needed for Pain for up to 5 days.   Quantity: 20 tablet  Refills: 0        STOP taking these medications    cephALEXin 500 MG capsule  Commonly known as: Keflex           Where to Get Your Medications      These medications were sent to [facility]  [address], [place] [address]    Hours: Monday - Friday: 9:00AM - 5:00PM Phone: [phone]   ?? mirtazapine 15 MG tablet     You can get these medications from any pharmacy    Bring a paper prescription for each of these medications  ?? traMADoL 50 mg tablet     Information about where to get these medications is not yet available    Ask your nurse or doctor about these medications  ?? polyethylene glycol 17 gram packet               Discharge Specific Orders   Discharge specific orders:  None required    Isolation     Patient Isolation Status     None to display            Physician Certification of Medically Necessary Transportation   Type and reason for transportation: Wheelchair - patient has difficulty walking.  Reason for transport to another facility: Rehabilitation, Skilled nursing facility requirements  Patient requires: tube feeding ongoing    Follow-up Appointments and [facility] Discharge Physician Name   No future appointments.  [facility]  [address]  [place] [address]  [phone]        SNF to call their PCP for an appointment upon discharge.    Please also continue palliative care on discharge to SNF    No follow-ups on file.    Physician Signature and Credentials   I certify that I have reviewed the information contained herein, and that the information is a true and accurate reflection of the individual's condition.    Discharging Physician: Electronically signed  by Cleone Slim, MD  01/06/2022, 10:44 AM    SOCIAL WORK DOCUMENTATION     Facility/Agency Name: Skilled Nursing Facility Name: [facility]    Number to call report:      Level of Care at Discharge: Skilled Nursing Facility           Less than 30 day convalescent stay: Yes    PASARR/HENS 7000 Completed: N/A (  return to facility)    Family Member Name and Relationship Notified at Discharge:      Family Contact Number:      Interior and spatial designer Name and Telephone Number:        NURSE DISCHARGE ASSESSMENT   Vitals:  Patient Vitals for the past 4 hrs:   BP Temp Temp src Pulse Resp SpO2   01/06/22 0735 150/88 97.5 ??F (36.4 ??C) Axillary 83 18 95 %        Orientation:       Orientation Level: Oriented to person, Oriented to place  Patient Behaviors: Cooperative, Anxious    Respiratory:  Respiratory (WDL): Exceptions to WDL  Respiratory Pattern: Regular, Unlabored  Chest Assessment: Chest expansion symmetrical  Bilateral Breath Sounds: Diminished, Clear  R Breath Sounds: Clear  L Breath Sounds: Diminished      Cardiac:  Cardiac (WDL): Within Defined Limits    Edema:  Peripheral Vascular (WDL): Exceptions to WDL  Edema: Left upper extremity, Left lower extremity, Right lower extremity  LUE Edema: Non Pitting Edema  RLE Edema: Mild pitting, slight indentation  LLE Edema: Mild pitting, slight indentation    Wounds:  Wound Pressure Ulcer Sacrum-Site (Wound bed) Assessment: Unable to Assess  Wound Pressure Ulcer Ischial Left-Site (Wound bed) Assessment: Unable to Assess  Wound Pressure Ulcer Back Left;Medial Stage 1 mid back-Site (Wound bed) Assessment: Unable to Assess  Wound Other (Comment) Pretibial Left redness/excoriation to circumference of calf-Site (Wound bed) Assessment: Unable to Assess  Wound Abrasion(s) Buttocks Left;Inner-Site (Wound bed) Assessment: Unable to Assess  Incision 01/04/22 Hip Anterior;Left;Proximal;Upper Hip surgery incision-Site (Wound bed) Assessment: Unable to Assess  Wound  Pressure Ulcer Sacrum-Wound Length: 3 cm  Wound Pressure Ulcer Ischial Left-Wound Length: 2 cm  Wound Pressure Ulcer Back Left;Medial Stage 1 mid back-Wound Length: 0.5 cm  Incision 01/04/22 Hip Anterior;Left;Proximal;Upper Hip surgery incision-Wound Length: 14 cm  Wound Pressure Ulcer Sacrum-Wound Width: 3.5 cm  Wound Pressure Ulcer Ischial Left-Wound Width: 1 cm  Wound Pressure Ulcer Back Left;Medial Stage 1 mid back-Wound Width: 0.5 cm  Wound Pressure Ulcer Sacrum-Site (Wound bed) Assessment: Unable to Assess  Wound Pressure Ulcer Ischial Left-Site (Wound bed) Assessment: Unable to Assess  Wound Pressure Ulcer Back Left;Medial Stage 1 mid back-Site (Wound bed) Assessment: Unable to Assess  Wound Other (Comment) Pretibial Left redness/excoriation to circumference of calf-Site (Wound bed) Assessment: Unable to Assess  Wound Abrasion(s) Buttocks Left;Inner-Site (Wound bed) Assessment: Unable to Assess  Incision 01/04/22 Hip Anterior;Left;Proximal;Upper Hip surgery incision-Site (Wound bed) Assessment: Unable to Assess    Comfort/Mattress:       Musculoskeletal:  Musculoskeletal (WDL): Exceptions to WDL (gen weakness)  LUE: Limited movement  RUE: Limited movement  RLE: Limited movement  LLE: Limited movement    GI:  Gastrointestinal (WDL): Within Defined Limits  Last BM Date: 01/05/22  Bowel Incontinence: Yes  Stool Source: Rectum    GU:  Genitourinary (WDL): Exceptions to WDL  Genitourinary Symptoms: Other (Comment) (Foley)  Urinary Incontinence: No  Urine Source: Urine Catheter    Lines and Drains:  Patient Lines/Drains/Airways Status     Active Line / PIV Line     Name Placement date Placement time Site Days    Peripheral IV 01/03/22 Anterior;Proximal;Right Forearm 01/03/22  --  Forearm  3    Peripheral IV 01/05/22 Posterior;Right Hand 01/05/22  0449  Hand  1                ADL's:  Level of Assistance: Dependent,  patient does less than 25%  Feeding: Total assist  Level of Assistance: Dependent    Morse Fall Risk   Morse Fall Risk Score: (!) 70    Restraints:       RN to RN Handoff:  RN Giving Report:: Research officer, trade unionMatilda  RN Receiving Report:: Jayme  Reason for Handoff: Shift Change, Report given to next shift Runner, broadcasting/film/videoN        Nurse and Credentials   RN Handoff Completed by: Matilda on 01/06/2022

## 2022-01-06 NOTE — Unmapped (Signed)
Report called to [place] at [facility].

## 2022-01-06 NOTE — Unmapped (Signed)
[place]  Inpatient Discharge Summary     Patient: Michelle Ponce  Age: 71 y.o.    MRN: [id number]   CSN: [id number]    Date of Admission: 01/03/2022  Date of Discharge: 01/06/2022  Attending Physician: Melissa Montane, MD   Primary Care Physician: Alden Server, MD     Diagnoses Present on Admission     Past Medical History:   Diagnosis Date   ??? Hypercholesteremia    ??? Hypertension    ??? Stroke (CMS-HCC)         Discharge Diagnoses     Active Hospital Problems    Diagnosis Date Noted   ??? Disorientation [R41.0] 01/05/2022   ??? Aspiration into respiratory tract [T17.908A] 01/05/2022   ??? Moderate protein-calorie malnutrition (CMS-HCC) [E44.0] 01/05/2022      Resolved Hospital Problems   No resolved problems to display.       Operations/Procedures Performed (include dates)     Surgeries: None      Lines and tubes:  Patient Lines/Drains/Airways Status     Active Line / PIV Line     Name Placement date Placement time Site Days    Peripheral IV 01/03/22 Anterior;Proximal;Right Forearm 01/03/22  --  Forearm  3    Peripheral IV 01/05/22 Posterior;Right Hand 01/05/22  0449  Hand  1                Other Procedures / Pertinent Imaging:     CT Abdomen and Pelvis WO IV contrast    Result Date: 01/03/2022  IMPRESSION: CHEST 1.  Central secretions most significant in the right mainstem bronchus with scattered subsegmental mucous plugging, which may related to aspiration. 2.  Small right and moderate left pleural effusions with associated passive atelectasis. 3.  Bilateral patchy consolidations, which may related to multifocal pneumonia or aspiration. 4.  Right basilar predominant smooth septal thickening, which may related to pulmonary edema. ABDOMEN/PELVIS 1.  Small volume ascites, otherwise no acute abnormality of the abdomen and pelvis identified within the limits of a noncontrast exam. Approved by Carey Bullocks, MD on 01/03/2022 9:46 PM EDT I have personally reviewed the images and I agree with this report. Report Verified by: Zane Herald,  MD at 01/03/2022 9:57 PM EDT    X-ray Abdomen AP view    Result Date: 12/26/2021  IMPRESSION:  Nonspecific bowel gas pattern with moderate colonic stool burden. Report Verified by: William Hamburger, MD at 12/26/2021 1:41 PM EDT    CT Chest WO contrast    Result Date: 01/03/2022  IMPRESSION: CHEST 1.  Central secretions most significant in the right mainstem bronchus with scattered subsegmental mucous plugging, which may related to aspiration. 2.  Small right and moderate left pleural effusions with associated passive atelectasis. 3.  Bilateral patchy consolidations, which may related to multifocal pneumonia or aspiration. 4.  Right basilar predominant smooth septal thickening, which may related to pulmonary edema. ABDOMEN/PELVIS 1.  Small volume ascites, otherwise no acute abnormality of the abdomen and pelvis identified within the limits of a noncontrast exam. Approved by Carey Bullocks, MD on 01/03/2022 9:46 PM EDT I have personally reviewed the images and I agree with this report. Report Verified by: Zane Herald, MD at 01/03/2022 9:57 PM EDT    MRI Head W WO contrast    Result Date: 01/05/2022  IMPRESSION: 1. Foci and confluent areas T2/FLAIR hyperintensity with lacunar change, and multifocal susceptibility, consistent with chronic small vessel disease. 2. Remote right putamen/corona radiata hematoma cavity 3. Small focus diffusion  restriction is splenium corpus callosum. Report Verified by: Burt Ek, MD at 01/05/2022 8:35 AM EDT    CT Abdomen and Pelvis With IV contrast    Result Date: 01/04/2022  IMPRESSION: 1.  Severe anasarca including a small volume of ascites with moderate left and small right pleural effusions 2.  Large stool burden 3.  Left greater than right pelvic varices. Recommend correlation with any signs or symptoms of pelvic congestion syndrome. Report Verified by: Annett Fabian, MD at 01/04/2022 4:56 PM EDT    CT Abdomen and Pelvis With IV contrast    Result Date: 12/26/2021  IMPRESSION: 1.  No acute findings in the  abdomen or pelvis. 2.  Soft tissue edema and gas surrounding the left hip, likely related to recent hemiarthroplasty. 3.  Incidental 1.3 cm right adrenal nodule, likely an adenoma. Consider comparison with prior imaging or 12 month follow up as clinically indicated. 4.  Calcified atherosclerosis causing mild to moderate stenosis of the bilateral renal arteries and SMA ostium. I have personally reviewed the images and I agree with this report. Report Verified by: William Hamburger, MD at 12/26/2021 3:45 PM EDT    X-ray Portable Chest    Result Date: 01/03/2022  IMPRESSION: Patchy airspace disease greatest at the left lung base which may reflect aspiration or pneumonia. Report Verified by: Zane Herald, MD at 01/03/2022 4:06 PM EDT    TTE: 01/05/2022  Study Conclusions     - Left ventricle: The cavity size is normal. Wall thickness was increased in a pattern of moderate   ????LVH. Systolic function was normal. The estimated ejection fraction was in the range of 55% to 60%.   ????Wall motion was normal; there were no regional wall motion abnormalities. Doppler parameters are   ????consistent with abnormal left ventricular relaxation (grade 1 diastolic dysfunction).   - Aortic valve: Moderate regurgitation.   - Mitral valve: Mildly calcified annulus. Mild thickening. Mild regurgitation.   - Right ventricle: Systolic function was normal by objective interpretation.   - Tricuspid valve: Mild-moderate regurgitation.   - Pulmonary arteries: Systolic pressure was mildly increased, estimated to be 35mm Hg assuming that   ????the right atrial pressure was 3 mmHg.   - Pericardium, extracardiac: A trivial pericardial effusion is identified. There is a left pleural   ????effusion.     Lab Results   Component Value Date    CREATININE 0.28 (L) 01/06/2022    BUN 19 01/06/2022    NA 138 01/06/2022    K 3.9 01/06/2022    CL 105 01/06/2022    CO2 28 01/06/2022     Lab Results   Component Value Date    ALBUMIN 2.3 (L) 01/06/2022     Lab Results   Component Value  Date    WBC 4.2 01/06/2022    HGB 9.0 (L) 01/06/2022    HCT 25.8 (L) 01/06/2022    MCV 100.9 (H) 01/06/2022    PLT 381 01/06/2022     Microbiology Results     Date and Time Order Name Sensitivity Status Organisms Specimen ID Source    01/04/2022 12:35 PM Urine culture  Preliminary  [id number] Urine    01/03/2022 10:06 PM #2  Blood culture-Peripheral site 2  Preliminary  [id number] Peripheral    01/03/2022  9:02 PM #1  Blood culture-Peripheral site 1  Preliminary  [id number] Peripheral    12/26/2021  1:25 PM Urine culture  Final  [id number] Urine  Consulting Services (include reason)     Consult Orders (From admission, onward)     Start     Ordered    01/04/22 1101  Inpatient consult to Palliative Care  Once        Provider:  (Not yet assigned)   Question Answer Comment   Consulting Palliative Care Group [facility]    Indication/Reason for Consult? Goals of Care    Requesting Clinician Name/Team Dr. Melissa Montane    Reliable contact number to reach Provider [phone]        01/04/22 1101                Allergies     Allergies   Allergen Reactions   ??? Aspirin    ??? Atenolol    ??? Codeine    ??? Methylprednisone    ??? Sudafed [Pseudoephedrine Hcl]        Discharge Medications        Medication List      TAKE these medications, which are NEW      Quantity/Refills   mirtazapine 15 MG tablet  Commonly known as: REMERON  Take 1 tablet (15 mg total) by mouth at bedtime.   Quantity: 30 tablet  Refills: 0     polyethylene glycol 17 gram packet  Commonly known as: MIRALAX  17 g by Per G Tube route daily.  Start taking on: January 07, 2022   Quantity: 14 packet  Refills: 0        TAKE these medications, which you were ALREADY TAKING      Quantity/Refills   atorvastatin 10 MG tablet  Commonly known as: LIPITOR  1 tablet (10 mg total) by Per G Tube route daily.   Refills: 0     enoxaparin 40 mg/0.4 mL Syrg  Commonly known as: LOVENOX  Inject 0.4 mLs (40 mg total) subcutaneously daily.   Refills: 0     loratadine 10 mg tablet  Commonly known as:  CLARITIN  1 tablet (10 mg total) by Per G Tube route daily.   Refills: 0     pantoprazole 20 MG tablet  Commonly known as: PROTONIX  1 tablet (20 mg total) every morning before breakfast.   Refills: 0     traMADoL 50 mg tablet  Commonly known as: ULTRAM  1 tablet (50 mg total) by Per G Tube route every 6 hours as needed for Pain for up to 5 days.   Quantity: 20 tablet  Refills: 0        STOP taking these medications    cephALEXin 500 MG capsule  Commonly known as: Keflex           Where to Get Your Medications      These medications were sent to [facility]  [address], [place] [address]    Hours: Monday - Friday: 9:00AM - 5:00PM Phone: [phone]   ?? mirtazapine 15 MG tablet     You can get these medications from any pharmacy    Bring a paper prescription for each of these medications  ?? traMADoL 50 mg tablet     Information about where to get these medications is not yet available    Ask your nurse or doctor about these medications  ?? polyethylene glycol 17 gram packet             Discharge Exam     General: Patient alert, resting in bed, tired, responsive,   Heent/neck: NCAT,  Neck soft, supple, warm  Cv: HRRR, S1S2, no murmur appreciated   Resp:CTA b/l no wheezes, rales, ronchi  GI: hyper tymapnic, distended, no masses appreciated, no guarding, rebound tenderness   Ext: Warm, no edema  Neuro: AOx4, able to follow some commands  Psych: depressed affect, tired     Reason for Admission     Michelle Ponce is a 71 y.o. female with PMH of CVA with residual hemiparesis, left hip fracture and hemiarthroplasty (3 weeks ago) who presented with disorientation and abdominal pain.       Hospital Course     Active Hospital Problems    Diagnosis Date Noted   ??? Disorientation [R41.0] 01/05/2022   ??? Aspiration into respiratory tract [T17.908A] 01/05/2022   ??? Moderate protein-calorie malnutrition (CMS-HCC) [E44.0] 01/05/2022      Resolved Hospital Problems   No resolved problems to display.     Michelle Ponce  is a 71 year old female who was admitted to [facility] for disorientation and abdominal pain. The patient had recently visited the ED for similar complaints as well as urinary retention. While hospitalized, the patient was started on empiric antibiotics (vancomyacin and cefepime) due to concern of infection and a broad workup for her abdominal pain. Michelle Ponce's infection work up included imagining (above), blood and urine cultures, wound care consult, and monitoring of vital signs. Her work up did not yield a positive sign or source of infection, so her antibiotics were discontinued on 01/06/2022. The patient had multiple CTs of her abdomen (above), it is thought that her abdominal pain is most likely multi-factorial in nature. To rule out a cardiac cause for her ascites, a TTE was obtained and appeared to be noncontributory.  While hospitalized, she was given a SMOG enema, Miralax, and milk of magnesia PRN. It is likely that Michelle Ponce is malnourished which may explain the imaging findings. She was seen by Nutrition and SLP for tube feed recommendations and a swallowing evaluation. Palliative care was consulted for goals of care and family support.  During this hospitalization, her home Claritin and tramadol were held. She was started on mirtazapine to help with appetite and concern of depression.   At this time, Michelle Ponce vitals, labs, and mental status have improved and she is appreciate for discharge back to her facility.     Condition on Discharge     1. Functional Status: moderately impaired   Describe limitations, if any:     OT: Assessment: Decreased ADL status, Decreased UE strength, Decreased Safe judgment during ADL, Decreased cognition, Decreased self-care transfers, Decreased UE ROM, Decreased activity tolerance, Decreased Functional Mobility, Decreased Balance: Recommendation: Short-term skilled OT    PT:??Impaired Bed Mobility, Impaired Transfers, Impaired Gait, Impaired Balance, Impaired Strength,  Impaired ROM, Impaired Cognition, Impaired Activity Tolerance Short-term skilled PT??????????    2. Mental Status: Alert/Oriented   Describe limitations, if any: none    3. Dietary Restrictions / Tube Feeding / TPN  Diet Orders          Diet NPO effective now Except for: except ice chips, other (see comments) Sips of water starting at 04/04 1800    Feeding tube water bolus starting at 04/03 1600    Continuous tube feedings starting at 04/03 1420    Dietary nutrition supplements starting at 04/02 1954        As listed above    4. Discharge specific orders:       5. Core measures followed: (if this is a core  measure patient)  Discharge Weight: (!) 96 lb 1.9 oz (43.6 kg)            Disposition     STSNF      Follow-Up Appointments     No future appointments.  CCM [facility]  [address]  [place] [address]  [phone]          ATTESTATION  Patient seen with the medical student and examined on this date  D/w medical student: agree with evaluation  My summary:  1. AMS: very weak/debilitated but alert and oriented  2. Aspiration w ?pna: doing well w/o fever, cough or inc fio2 requirements; BC neg; can dc iv atbx  3. abd pain: likely from bloating and stool retention; abd softer but still distended/hypertympanic w normal BS; had a very good result w smog enema; cont bowel reg w miralax  4. Anasarca: likely r/t nutritional status; TTE was better than expected, no sys dysfxn and only Gr1 DD  5. Depression: started mirtaz yesterday      35 mins    Melissa Montane, MD, FAAFP   Associate Professor of Family Medicine [facility])   [facility]

## 2022-01-06 NOTE — Unmapped (Signed)
Physical Therapy  Discharge Report     Patient Identification  Michelle Ponce is a 71 y.o. female.  DOB:  12/22/1950  Admit Date:  01/03/2022  Discharge date and time: 01/06/2022  2:12 PM   Attending Provider: No att. providers found                                   Admission Diagnoses: Abdominal pain, unspecified abdominal location [R10.9]  Acute anemia [D64.9]  Reviewed Pertinent hospital course: Yes      Patient discharged from hospital on 01/06/2022 from PT to SNF    Initial Evaluation completed on 01/04/2022.     Met 0 out of 4 goals.    Goals that were not met were due to brevity of treatment.    Equipment issued No.    Patient educated about positioning, LE exercises/movement.    Patient response to education fair     See last PT note for current functional status at time if discharge    Michelle Ponce [address] [place], [place] [address]  01/06/2022  Ascom [phone]

## 2022-01-06 NOTE — Unmapped (Signed)
[place]  Palliative Care Spiritual Assessment    PATIENT NAME: Michelle Ponce  DATE OF BIRTH: Nov 27, 1950    PCP: Alden Server, MD  MRN#: [id number]  [id number]  ADMIT DATE: 01/03/2022  LOS: 2  DATE OF SERVICE: 01/06/2022    Family contact today: none    Interventions: Palliative Care Team visited pt today per palliative practice. Pt sleepy and lethargic. When asked what she would like to see happen, pt whispered to get better and then was back to sleepy-state. No needs were presented to the chaplain. Team will continue to follow and support.    Spiritual Assessment:  Religious Affiliation:Christian               Community: family    Address/ Action in Care (Plan):           Advances Directives/Decision Making:      Current Code Status: Full Code      Advance Directives:Patient does not have advance directive            Advance Directives Type:       Surrogate Decision Maker:       Surrogate relationship:       Surrogate Phone:          Cultural/Spiritual History:  Specific spiritual/ cultural preferences: Ephriam Knuckles  Anticipating grief/ loss issues identified: none  Risk for complicated grief: low        Michelle Ponce  1:36 PM      Thank you for allowing the Palliative Care Team to participate in the care of this patient. Please call the Palliative Care Services for any questions or concerns:  [phone]. For after hours and weekends [phone] and the operator will page the on-call provider.

## 2022-01-06 NOTE — Unmapped (Signed)
Palliative Care   Follow Up Note    PATIENT NAME: Michelle MonicaDeborah Agar  DATE OF BIRTH: 26-Jul-1951  ATTENDING:   PCP: Alden ServerPaul G Jentes, MD  MRN#: [id number]  ADMIT DATE: 01/03/2022  LOS: 2  DATE OF SERVICE: 01/06/2022    ASSESSMENT AND RECOMMENDATIONS / PLAN   Michelle Ponce is a 71 y.o. female with a history of HTN, HLD, CVA (c/b L hemiparesis and malnutrition) admitted 01/03/2022 with AMS.  Palliative care consulted for goals of care.    1. Goals of Care / Treatment Preferences related to CVA c/b dysphagia s/p PEG and severe protein calorie malnutrition:    Did not address today    --Code status: Full Code  --Preferred Disposition: TBD         2. Symptoms of distress:   ??? Anorexia:  Poor PO intake since recent hip fracture ~3 weeks ago.  Associated with hypoalbuminemia and noted severe anasarca on recent imaging.  Constipation could be contributing with large stool burden on CT scan.  Discussed with primary team who shared concern for depression as a potential contributor.   Appetitie may be the result of general decline, debility and progression of disease.  In this case, likely not to respond to appetite stimulants.  Dependent on tube feeds.    o Remeron 15 mg qHS given concurrent depression.  Can titrate by 15 mg every week to effect.       3. Psychosocial/Spiritual:   ??? Care Provided: care/communication coordination and therapeutic/empathic listening  ??? Palliative care interdisciplinary team will continue to follow for support.    4. Advance Directives/Surrogate Decision Maker:  Advance Directives:Patient does not have advance directive  Advance Directives Type:   Agent/DPOA:    Agent/DPOA Phone:      Next of VWU:JWJXKin:[name] Manson PasseyBrown  Next of Kin relationship: Spouse  Next of Kin Phone:#[phone]    SUBJECTIVE   Chief complaint / Reason for followup:  Discuss complex medical decision making related to goals of care    Interval Update/Last 24h:  Noted plan for discharge back to SNF today.  Patient feels fatigued and with poor appetite.          OBJECTIVE     Last Vital Signs:  BP 140/77 (BP Location: Left arm, Patient Position: Lying)    Pulse 89    Temp 97.6 ??F (36.4 ??C) (Axillary)    Resp 18    Ht 5' 2 (1.575 m)    Wt (!) 96 lb 1.9 oz (43.6 kg)    SpO2 94%    BMI 17.58 kg/m??     Intake/Output:    Intake/Output Summary (Last 24 hours) at 01/06/2022 1315  Last data filed at 01/06/2022 0912  Gross per 24 hour   Intake 2960.24 ml   Output 750 ml   Net 2210.24 ml       Physical Exam:  General Appearance: chronically ill appearing; cachectic   HEENT:lips, gums, tongue, oral mucosa, teeth normal and intact  Pulmonary:normal work of breathing and no tachypnea, retractions or cyanosis  Cardiovascular:regular rate and rhythm  ZH:YQMVHQIOGI:scaphoid; PEG Noted  Musculoskeletal:no lower or upper extremity edema  Skin:warm, dry, normal color, without lesions or rash    Labs Personally Reviewed:     Lab Results   Component Value Date/Time    WBC 4.2 01/06/2022 04:20 AM    RBC 2.55 (L) 01/06/2022 04:20 AM    HCT 25.8 (L) 01/06/2022 04:20 AM    PLT 381 01/06/2022 04:20 AM  Lab Results   Component Value Date/Time    NA 138 01/06/2022 04:20 AM    K 3.9 01/06/2022 04:20 AM    CL 105 01/06/2022 04:20 AM    CO2 28 01/06/2022 04:20 AM    BUN 19 01/06/2022 04:20 AM    CREATININE 0.28 (L) 01/06/2022 04:20 AM    GLUCOSE 147 (H) 01/06/2022 04:20 AM    CALCIUM 8.0 (L) 01/06/2022 04:20 AM     Imaging Personally Reviewed: No new imaging     Documents/Notes Personally Reviewed:  Medicine and nursing notes  _______________________________________________    The palliative care team appreciates the opportunity to be involved in the care of this patient. For any questions or concerns, please page the palliative care team via [facility] or the operator.    I attest that I spent 45 minutes for today's visit performing the following activities: Chart review, Obtaining/Reviewing History, Communicating with primary team and/or consultants on the patient's care plan, Documenting in the Patient's  Medical Record, Providing active listening and support related to serious or life-limiting illness and Care Coordination.      Electronically signed by:  Azzie Glatter, DO  01/06/2022

## 2022-01-06 NOTE — Unmapped (Signed)
[facility]  Case Management/Social Work Department  Progress Note    Patient Information     Hospital day: 2  Inpatient/Observation:  Inpatient   Admit date:  01/03/2022  Admission diagnosis: Abdominal pain, unspecified abdominal location [R10.9]  Acute anemia [D64.9]    Other Pertinent Information     CM spoke with MD and he is ready for discharge today. CM called and spoke with Gen at [facility] [phone] and she confirmed patient can return to SNF today.    Discharge Plan     Anticipated discharge plan:  Return to [facility]    Discharge barriers: none     Anticipated discharge date:  01/06/2022    CM/SW will continue to follow and remain available until hospital discharge for discharge planning needs.      Jocelyn Lamer RN, CM  [phone]

## 2022-01-06 NOTE — Unmapped (Signed)
Pharmacy Vancomycin Monitoring Service    Michelle Ponce is a 71 y.o. female started on vancomycin. Pharmacy consulted for dosing, monitoring, and adjustment.    Indication for treatment: empiric therapy  Goal trough: 15-20 mcg/mL    Height: 5' 2 (157.5 cm)   Wt Readings from Last 3 Encounters:   01/05/22 (!) 96 lb 1.9 oz (43.6 kg)   12/26/21 114 lb 14.4 oz (52.1 kg)          Female patients must weigh at least 45.5 kg to calculate ideal body weight    Pertinent Laboratory Values:       Lab 01/06/22  0420 01/05/22  0442 01/04/22  0419   CREATININE 0.28* 0.30* 0.23*   BUN 19 22 21    WBC 4.2 7.7 7.0       Weight used for CrCl calculation: 43.6 kg  Baseline SCr: 0.2-0.3 mg/dL  Current EstCrCl: > 60 mL/min    Urine Output:  I/O last 3 completed shifts:  In: 2887.2 [NG/GT:2000; IV Piggyback:887.2]  Out: 1150 [Urine:1150]    Vital Sign ranges over last 24 hours:  Temp:  [97.5 ??F (36.4 ??C)-98.6 ??F (37 ??C)] 97.5 ??F (36.4 ??C)  Heart Rate:  [88-101] 88  Resp:  [16-17] 16  BP: (125-149)/(65-86) 142/86    Pertinent Cultures:  Date Source Results                              Staph aureus nasal swab results:  No results found for requested labs within last 720 hours.  No results found for requested labs within last 720 hours.    COVID-19 status:   No results found for requested labs within last 720 hours.    Vancomycin concentrations:  Recent Labs     01/06/22  0420   VANCOTROUGH 10.2       Date/Time Drawn Concentration (mcg/mL) Comments   4/5 @ 0420 10.2 12 hours post-dose, on vancomycin 750 mg q12h       Assessment/Plan:  ?? Michelle Ponce is a 71 y.o. female started on vancomycin for empiric therapy  ?? Vancomycin:  ?? Today is day 4 of vancomycin therapy.  ?? Scr 0.28 - at baseline  ?? Vancomycin trough level: 10.2 mcg/mL, subtherapeutic, collected at steady state prior to 5th dose  ?? Will increase vancomycin to 750 mg q8h  ?? Will check repeat vancomycin level tomorrow prior to the AM dose   ?? Other antimicrobials:  ?? Cefepime 2 g  q12h  ?? Pharmacy will continue to monitor patient and adjust therapy as indicated    Thank you for the consult.    Mayla Biddy, Pharm.D.    01/06/2022 7:07 AM  Phone: [phone]    For clinical pharmacy assistance on evenings and weekends, please call [phone].

## 2022-01-07 NOTE — Unmapped (Signed)
Occupational Therapy  Discharge Report     Patient Identification  Michelle Ponce is a 71 y.o. female.  DOB:  10-10-1950  Admit Date:  01/03/2022  Discharge date and time: 01/06/2022  2:12 PM   Attending Provider: No att. providers found                                   Admission Diagnoses: Abdominal pain, unspecified abdominal location [R10.9]  Acute anemia [D64.9]  Reviewed Pertinent hospital course: Yes      Patient discharged from hospital on 01/06/22 from OT    Initial Evaluation completed on 01/04/22.     Met 0 out of 6 goals.    Goals that were not met were due to brevity of admit, significant deconditioning and weakness.    Equipment issued No.    Patient educated about role of OT/POC, role of POC, functional transfers and safety with ADLs.    Patient response to education fair     See last OT note for current functional status at time if discharge      Cordie Gricerin Jaslynne Dahan, OTR/L  License #: [id number]  Ascom: [phone]  01/07/2022

## 2022-01-24 ENCOUNTER — Inpatient Hospital Stay
Admission: EM | Admit: 2022-01-24 | Discharge: 2022-01-28 | Disposition: A | Discharge status: Skilled Nursing Facility | Payer: MEDICARE

## 2022-01-24 ENCOUNTER — Emergency Department: Admit: 2022-01-24 | Payer: MEDICARE

## 2022-01-24 DIAGNOSIS — J189 Pneumonia, unspecified organism: Principal | ICD-10-CM

## 2022-01-24 LAB — HEPATIC FUNCTION PANEL
ALT: 20 U/L (ref 7–52)
AST: 29 U/L (ref 13–39)
Albumin: 2.9 g/dL — ABNORMAL LOW (ref 3.5–5.7)
Alkaline Phosphatase: 51 U/L (ref 36–125)
Bilirubin, Direct: 0 mg/dL (ref 0.0–0.4)
Bilirubin, Indirect: 0.3 mg/dL (ref 0.0–1.1)
Total Bilirubin: 0.3 mg/dL (ref 0.0–1.5)
Total Protein: 6.5 g/dL (ref 6.4–8.9)

## 2022-01-24 LAB — URINALYSIS W/RFL TO MICROSCOPIC
Bilirubin, UA: NEGATIVE
Glucose, UA: NEGATIVE mg/dL
Ketones, UA: NEGATIVE mg/dL
Nitrite, UA: NEGATIVE
Protein, UA: 100 mg/dL — AB
RBC, UA: 56 /HPF — ABNORMAL HIGH (ref 0–3)
Specific Gravity, UA: 1.019 (ref 1.005–1.035)
Urobilinogen, UA: 2 mg/dL — ABNORMAL HIGH (ref 0.2–1.9)
WBC, UA: >100 /HPF — ABNORMAL HIGH (ref 0–5)
pH, UA: 7 (ref 5.0–8.0)

## 2022-01-24 LAB — CBC
Hematocrit: 29.9 % (ref 35.0–45.0)
Hemoglobin: 10 g/dL (ref 11.7–15.5)
MCH: 34.9 pg (ref 27.0–33.0)
MCHC: 33.5 g/dL (ref 32.0–36.0)
MCV: 104.4 fL (ref 80.0–100.0)
MPV: 6.4 fL (ref 7.5–11.5)
Platelets: 376 10*3/uL (ref 140–400)
RBC: 2.86 10*6/uL (ref 3.80–5.10)
RDW: 14.5 % (ref 11.0–15.0)
WBC: 7.6 10*3/uL (ref 3.8–10.8)

## 2022-01-24 LAB — B NATRIURETIC PEPTIDE: BNP: 299 pg/mL (ref 0–100)

## 2022-01-24 LAB — ABO/RH: Rh Type: POSITIVE

## 2022-01-24 LAB — DIFFERENTIAL
Basophils Absolute: 15 /uL (ref 0–200)
Basophils Relative: 0.2 % (ref 0.0–1.0)
Eosinophils Absolute: 182 /uL (ref 15–500)
Eosinophils Relative: 2.4 % (ref 0.0–8.0)
Lymphocytes Absolute: 1064 /uL (ref 850–3900)
Lymphocytes Relative: 14 % (ref 15.0–45.0)
Monocytes Absolute: 479 /uL (ref 200–950)
Monocytes Relative: 6.3 % (ref 0.0–12.0)
Neutrophils Absolute: 5860 /uL (ref 1500–7800)
Neutrophils Relative: 77.1 % (ref 40.0–80.0)
nRBC: 0 /100 WBC (ref 0–0)

## 2022-01-24 LAB — BASIC METABOLIC PANEL
Anion Gap: 4 mmol/L (ref 3–16)
BUN: 30 mg/dL (ref 7–25)
CO2: 31 mmol/L (ref 21–33)
Calcium: 9.1 mg/dL (ref 8.6–10.3)
Chloride: 94 mmol/L (ref 98–110)
Creatinine: 0.22 mg/dL (ref 0.60–1.30)
EGFR: >90
Glucose: 106 mg/dL (ref 70–100)
Osmolality, Calculated: 275 mOsm/kg (ref 278–305)
Potassium: 5.1 mmol/L (ref 3.5–5.3)
Sodium: 129 mmol/L (ref 133–146)

## 2022-01-24 LAB — VENOUS BLOOD GAS, LINE/SYRINGE
%HBO2-Line Draw: 69.2 % (ref 40.0–70.0)
Base Excess-Line Draw: 7.5 mmol/L — ABNORMAL HIGH (ref <=3.0)
CO2 Content-Line Draw: 34 mmol/L — ABNORMAL HIGH (ref 25–29)
Carboxyhgb-Line Draw: 1.9 %
HCO3-Line Draw: 33 mmol/L — ABNORMAL HIGH (ref 24–28)
Methemoglobin-Line Draw: 0.6 % (ref 0.0–1.5)
PCO2-Line Draw: 48 mmHg (ref 41–51)
PH-Line Draw: 7.44 — ABNORMAL HIGH (ref 7.32–7.42)
PO2-Line Draw: 37 mmHg (ref 25–40)
Reduced Hemoglobin-Line Draw: 28.3 % — ABNORMAL HIGH (ref 0.0–5.0)

## 2022-01-24 LAB — ANTIBODY SCREEN: Antibody Screen: NEGATIVE

## 2022-01-24 LAB — LACTIC ACID: Lactate: 1.2 mmol/L (ref 0.5–2.2)

## 2022-01-24 LAB — URINE CULTURE

## 2022-01-24 LAB — BLOOD CULTURE-PERIPHERAL
Culture Result: NO GROWTH
Culture Result: NO GROWTH

## 2022-01-24 LAB — PHOSPHORUS: Phosphorus: 3.4 mg/dL (ref 2.1–4.5)

## 2022-01-24 LAB — PROTIME-INR
INR: 0.9 (ref 0.9–1.1)
Protime: 13 seconds (ref 12.1–15.1)

## 2022-01-24 LAB — MAGNESIUM: Magnesium: 2 mg/dL (ref 1.5–2.5)

## 2022-01-24 LAB — HIGH SENSITIVITY TROPONIN: High Sensitivity Troponin: 41 ng/L — ABNORMAL HIGH (ref 0–14)

## 2022-01-24 MED ORDER — sodium chloride 0.9 % infusion
INTRAVENOUS | Status: AC
Start: 2022-01-24 — End: 2022-01-25
  Administered 2022-01-25: 04:00:00 100 mL/h via INTRAVENOUS

## 2022-01-24 MED ORDER — heparin (porcine) injection 5,000 Units
5000 | Freq: Three times a day (TID) | INTRAMUSCULAR | Status: AC
Start: 2022-01-24 — End: 2022-01-24

## 2022-01-24 MED ORDER — magnesium hydroxide (MILK OF MAGNESIA) 2,400 mg/10 mL oral suspension 10 mL
2400 | Freq: Every day | ORAL | Status: AC | PRN
Start: 2022-01-24 — End: 2022-01-28

## 2022-01-24 MED ORDER — lactated Ringers infusion 250 mL
Freq: Once | INTRAVENOUS | Status: AC
Start: 2022-01-24 — End: 2022-01-24
  Administered 2022-01-24: 23:00:00 250 mL via INTRAVENOUS

## 2022-01-24 MED ORDER — lactated Ringers infusion 1,000 mL
Freq: Once | INTRAVENOUS | Status: AC
Start: 2022-01-24 — End: 2022-01-24
  Administered 2022-01-24: 21:00:00 1000 mL via INTRAVENOUS

## 2022-01-24 MED ORDER — piperacillin-tazobactam (ZOSYN) 3.375 g in sodium chloride 0.9% 100 mL ADV IVPB
Freq: Three times a day (TID) | INTRAVENOUS | Status: AC
Start: 2022-01-24 — End: 2022-01-27
  Administered 2022-01-25 – 2022-01-27 (×7): 3.375 g via INTRAVENOUS

## 2022-01-24 MED ORDER — acetaminophen (TYLENOL) oral solution Soln 650 mg
650 | Freq: Once | ORAL | Status: AC
Start: 2022-01-24 — End: 2022-01-24
  Administered 2022-01-24: 22:00:00 650 mg via GASTROSTOMY

## 2022-01-24 MED ORDER — ondansetron (ZOFRAN) injection 4 mg
4 | Freq: Three times a day (TID) | INTRAMUSCULAR | Status: AC | PRN
Start: 2022-01-24 — End: 2022-01-28

## 2022-01-24 MED ORDER — azithromycin (ZITHROMAX) 500 mg in sodium chloride 0.9% 250 mL IVPB
Freq: Once | INTRAVENOUS | Status: AC
Start: 2022-01-24 — End: 2022-01-24
  Administered 2022-01-24: 23:00:00 500 mg via INTRAVENOUS

## 2022-01-24 MED ORDER — mirtazapine (REMERON) tablet 15 mg
15 | Freq: Every evening | ORAL | Status: AC
Start: 2022-01-24 — End: 2022-01-28
  Administered 2022-01-25 – 2022-01-28 (×4): 15 mg via ORAL

## 2022-01-24 MED ORDER — enoxaparin (LOVENOX) for prophylaxis syringe 30 mg/0.3 mL
30 | Freq: Every day | SUBCUTANEOUS | Status: AC
Start: 2022-01-24 — End: 2022-01-28
  Administered 2022-01-25 – 2022-01-28 (×4): 30 mg via SUBCUTANEOUS

## 2022-01-24 MED ORDER — vancomycin (VANCOCIN) 1,000 mg in sodium chloride 0.9% 250 mL ADDaptor IVPB
Freq: Once | INTRAVENOUS | Status: AC
Start: 2022-01-24 — End: 2022-01-24
  Administered 2022-01-24: 22:00:00 1000 mg/kg via INTRAVENOUS

## 2022-01-24 MED ORDER — acetaminophen (TYLENOL) tablet 650 mg
325 | ORAL | Status: AC | PRN
Start: 2022-01-24 — End: 2022-01-28
  Administered 2022-01-25 – 2022-01-27 (×3): 650 mg via ORAL

## 2022-01-24 MED ORDER — piperacillin-tazobactam (ZOSYN) 3.375 g in sodium chloride 0.9% 100 mL ADV IVPB
Freq: Once | INTRAVENOUS | Status: AC
Start: 2022-01-24 — End: 2022-01-24
  Administered 2022-01-24: 21:00:00 3.375 g via INTRAVENOUS

## 2022-01-24 MED ORDER — famotidine (PEPCID) tablet 20 mg
20 | Freq: Two times a day (BID) | ORAL | Status: AC
Start: 2022-01-24 — End: 2022-01-28
  Administered 2022-01-25 – 2022-01-28 (×8): 20 mg via ORAL

## 2022-01-24 MED ORDER — atorvastatin (LIPITOR) tablet 10 mg
10 | Freq: Every day | ORAL | Status: AC
Start: 2022-01-24 — End: 2022-01-28
  Administered 2022-01-25 – 2022-01-28 (×4): 10 mg via GASTROSTOMY

## 2022-01-24 MED FILL — LACTATED RINGERS INTRAVENOUS SOLUTION: 1000.00 1000.00 mL | INTRAVENOUS | Qty: 1000

## 2022-01-24 MED FILL — LACTATED RINGERS INTRAVENOUS SOLUTION: 250.00 250.00 mL | INTRAVENOUS | Qty: 1000

## 2022-01-24 MED FILL — VANCOMYCIN 1,000 MG INTRAVENOUS INJECTION: 1000 1000 mg | INTRAVENOUS | Qty: 1000

## 2022-01-24 MED FILL — PIPERACILLIN-TAZOBACTAM 3.375 GRAM INTRAVENOUS SOLUTION: 3.375 3.375 gram | INTRAVENOUS | Qty: 1

## 2022-01-24 MED FILL — ACETAMINOPHEN 650 MG/20.3 ML ORAL SOLUTION: 650 650 mg/20.3 mL | ORAL | Qty: 20.3

## 2022-01-24 MED FILL — MIRTAZAPINE 15 MG TABLET: 15 15 MG | ORAL | Qty: 1

## 2022-01-24 MED FILL — SODIUM CHLORIDE 0.9 % INTRAVENOUS SOLUTION: 100.00 100.00 mL/hr | INTRAVENOUS | Qty: 1000

## 2022-01-24 MED FILL — AZITHROMYCIN 500 MG INTRAVENOUS SOLUTION: 500 500 mg | INTRAVENOUS | Qty: 500

## 2022-01-24 NOTE — Unmapped (Signed)
[facility] ED Note    Date of Service: 01/24/2022    Reason for Visit: No chief complaint on file.      Patient History     HPI:  71 y.o. female who arrived via EMS presents with SOB and tachycardia. Husband and son are at bedside and independent historians. EMS states that husband started to notice patient was short of breath and lethargic this afternoon.. Husband noted that he arrived today around 2311 AM and the patient was doing fine at her nursing facility. He noticed around 3 PM that she started to have difficulty breathing and was sent to the ED. Reports that the patient had a fever and general weakness yesterday and they were giving her supplemental O2 to her due to difficulty breathing. She was at 90% on 5L at time of arrival and administered 10 L resulting O2 levels to increase. Blood pressure was 140/80-90's. Signs of tachycardia in the 130's. Report that patient was complaining of back pain at baseline and noted that EMS brought her to the ED around 2 weeks ago with similar complaints. Noted that patient is legally blind but able to track movement and recently admitted with AMS and pneumonia which was concerning for possible aspiration..   Husband notes that on good days the patient can follow conversation however sometimes the patient will go into a daze and he thinks that she is able to understand but is not worth the effort to talk. Notes that patient does not eat solid food.  Her diet consist of pur??ed foods in addition to tube feeds.    HPI is otherwise limited due to the mental status of the patient.     Patient had an echo in April 2023 that demonstrated an EF of 55 to 60%.    Patient is full code      Past Medical History:   Diagnosis Date   ??? Hypercholesteremia    ??? Hypertension    ??? Stroke (CMS-HCC)        No past surgical history on file.    Haydee MonicaDeborah Carrington  reports that she has never smoked. She has never used smokeless tobacco. She reports that  she does not currently use alcohol. She reports that she does not use drugs.    Previous Medications    ATORVASTATIN (LIPITOR) 10 MG TABLET    1 tablet (10 mg total) by Per G Tube route daily.    ENOXAPARIN (LOVENOX) 40 MG/0.4 ML SYRG    Inject 0.4 mLs (40 mg total) subcutaneously daily.    LORATADINE (CLARITIN) 10 MG TABLET    1 tablet (10 mg total) by Per G Tube route daily.    MIRTAZAPINE (REMERON) 15 MG TABLET    Take 1 tablet (15 mg total) by mouth at bedtime.    PANTOPRAZOLE (PROTONIX) 20 MG TABLET    1 tablet (20 mg total) every morning before breakfast.    POLYETHYLENE GLYCOL (MIRALAX) 17 GRAM PACKET    17 g by Per G Tube route daily.       Allergies:   Allergies as of 01/24/2022 - Fully Reviewed 01/03/2022   Allergen Reaction Noted   ??? Aspirin  12/26/2021   ??? Atenolol  12/26/2021   ??? Codeine  12/26/2021   ??? Methylprednisone  12/26/2021   ??? Sudafed [pseudoephedrine hcl]  12/26/2021       Review of Systems     ROS:  Please see HPI. ROS is otherwise limited due to the mental status of  the patient.       Physical Exam     ED Triage Vitals   Enc Vitals Group      BP 01/24/22 1634 (!) 122/97      Heart Rate 01/24/22 1634 143      Resp 01/24/22 1634 25      Temp --       Temp src --       SpO2 01/24/22 1634 92 %      Weight 01/24/22 1633 (!) 97 lb (44 kg)      Height --       Head Circumference --       Peak Flow --       Pain Score --       Pain Loc --       Pain Edu? --       Excl. in GC? --          General: Patient has eyes open, appears chronically ill, nontoxic    HEENT: Head is normocephalic and atraumatic.  Pupils are equal round and reactive to light. Extraocular muscles are intact. Mucous membranes are moist.    Neck: supple.  No JVD    Pulmonary:  Equal rise and fall the chest wall. Intercostal retractions and slightly tachypneic.  Coarse breath sounds throughout, rhonchorous breath sounds throughout.  No wheezing..      Cardiac: Tachycardic rate but regular rhythm..  No murmurs     GI:  Abdomen soft  nontender.  No organomegaly.  Normal active bowel sounds.  G-tube in place with no surrounding erythema or drainage    Musculoskeletal: normal range of motion throughout bilateral upper and lower extremities.    Vascular:  2+ peripheral pulses bilaterally upper and lower extremities.    Skin: warm and dry.  No rashes.    Neuro: Somnolent but arousable to voice.  No facial droop.  Aphasic.  Does not follow commands but does regard the examiner.          Diagnostic Studies     I reviewed all studies done today.    Labs:    Labs Reviewed   BASIC METABOLIC PANEL - Abnormal; Notable for the following components:       Result Value    Sodium 129 (*)     Chloride 94 (*)     BUN 30 (*)     Creatinine 0.22 (*)     Glucose 106 (*)     Osmolality, Calculated 275 (*)     All other components within normal limits   CBC - Abnormal; Notable for the following components:    RBC 2.86 (*)     Hemoglobin 10.0 (*)     Hematocrit 29.9 (*)     MCV 104.4 (*)     MCH 34.9 (*)     MPV 6.4 (*)     All other components within normal limits   DIFFERENTIAL - Abnormal; Notable for the following components:    Lymphocytes Relative 14.0 (*)     All other components within normal limits   HEPATIC FUNCTION PANEL - Abnormal; Notable for the following components:    Albumin 2.9 (*)     All other components within normal limits   HIGH SENSITIVITY TROPONIN - Abnormal; Notable for the following components:    High Sensitivity Troponin 41 (*)     All other components within normal limits   URINALYSIS W/RFL TO MICROSCOPIC - Abnormal; Notable for the following components:  Clarity, UA Turbid (*)     Protein, UA 100 (*)     Blood, UA Small (*)     Urobilinogen, UA 2.0 (*)     Leukocytes, UA Large (*)     RBC, UA 56 (*)     WBC, UA >100 (*)     WBC Clumps, UA Many (*)     All other components within normal limits   VENOUS BLOOD GAS, LINE/SYRINGE - Abnormal; Notable for the following components:    PH-Line Draw 7.44 (*)     HCO3-Line Draw 33 (*)     CO2  Content-Line Draw 34 (*)     Base Excess-Line Draw 7.5 (*)     Reduced Hemoglobin-Line Draw 28.3 (*)     All other components within normal limits   B NATRIURETIC PEPTIDE - Abnormal; Notable for the following components:    BNP 299 (*)     All other components within normal limits    Narrative:     The presence of high concentrations of biotin may cause falsely lowered BNP results. Biotin interference may be seen if an individual is taking >5 mg biotin per day. Interpret BNP results in the context of the patient's clinical presentation.   LACTIC ACID   PROTIME-INR   PHOSPHORUS   MAGNESIUM   HIGH SENSITIVITY TROPONIN   CBC   DIFFERENTIAL   ED ECG 12-LEAD (MUSE)    Narrative:     Ventricular Rate:  136  BPM  Atrial Rate:  136  BPM  P-R Interval:  116  ms  QRS Duration:  60  ms  QT:  292  ms  QTc:  439  ms  P Axis:  74  degrees  R Axis:  0  degrees  T Axis:  71  degrees  Diagnosis Line:  INTERPRETATION NOT AVAILABLE--ECG READ IN ER ^ Confirmed by MD, ER ([id number]), editor Napier, Heather ([id number]) on 01/24/2022 6:49:09 PM   ABO/RH   ANTIBODY SCREEN    Narrative:     Testing performed by [facility]       Radiology:    X-ray Portable Chest   Final Result   IMPRESSION:    1.  Slightly increased patchy opacities in the right mid and lower lung zone may relate to pneumonia or aspiration.   2.  Mild improvement of left basilar pleural-parenchymal opacities likely representing a left pleural effusion with associated parenchymal disease.      Report Verified by: Dagmar Hait, MD at 01/24/2022 5:14 PM EDT          EKG:    Indication tachycardia, Rate 136, Rhythm sinus tachycardia, Interval PR 116, QRS 60, QTc 459 and Axis normal.  Poor R wave progression across the precordium.  No ST or T wave abnormality.  Interpretation sinus tachycardia with no evidence of acute ischemia.    Emergency Department Procedures         ED Course and MDM     Jovana Rembold is a 71 y.o. female who presented to the emergency department with No  chief complaint on file.       See medications given  Medications   atorvastatin (LIPITOR) tablet 10 mg (has no administration in time range)   enoxaparin (LOVENOX) for prophylaxis syringe 30 mg/0.3 mL (has no administration in time range)   mirtazapine (REMERON) tablet 15 mg (15 mg Oral Given 01/25/22 0007)   sodium chloride 0.9 % infusion (100 mL/hr Intravenous New Bag 01/25/22 0009)  acetaminophen (TYLENOL) tablet 650 mg (has no administration in time range)   magnesium hydroxide (MILK OF MAGNESIA) 2,400 mg/10 mL oral suspension 10 mL (has no administration in time range)   ondansetron (ZOFRAN) injection 4 mg (has no administration in time range)   piperacillin-tazobactam (ZOSYN) 3.375 g in sodium chloride 0.9% 100 mL ADV IVPB (3.375 g Intravenous New Bag 01/25/22 0150)   famotidine (PEPCID) tablet 20 mg (20 mg Oral Given 01/25/22 0007)   lactated Ringers infusion 1,000 mL (0 mLs Intravenous Stopped 01/24/22 1835)     Followed by   lactated Ringers infusion 250 mL (0 mLs Intravenous Stopped 01/24/22 1913)   azithromycin (ZITHROMAX) 500 mg in sodium chloride 0.9% 250 mL IVPB (0 mg Intravenous Stopped 01/24/22 2014)   piperacillin-tazobactam (ZOSYN) 3.375 g in sodium chloride 0.9% 100 mL ADV IVPB (3.375 g Intravenous Given 01/24/22 1720)   vancomycin (VANCOCIN) 1,000 mg in sodium chloride 0.9% 250 mL ADDaptor IVPB (0 mg Intravenous Stopped 01/24/22 1914)   acetaminophen (TYLENOL) oral solution Soln 650 mg (650 mg Per G Tube Given 01/24/22 1744)       Medical Decision Making  The patient presented with evidence of sepsis with significant tachycardia and a new oxygen requirement.  Patient has an indwelling Foley catheter which also appears very cloudy with occasional small blood clots.  No obvious gross hematuria.  Based on the patient's oxygen requirement and lung exam, I am concerned for continued pneumonia.  At presentation, the patient was started with a sepsis bundle including 30 cc/kg bolus of IV fluids.  The patient  was never hypotensive.  She did not have an elevated lactate.  Patient was given broad-spectrum antibiotics to cover both urine and lung including possible aspiration pneumonia.    Complicated UTI (urinary tract infection): acute illness or injury  Pneumonia of both lungs due to infectious organism, unspecified part of lung: acute illness or injury  Sepsis with encephalopathy without septic shock, due to unspecified organism (CMS-HCC): acute illness or injury  Amount and/or Complexity of Data Reviewed  Labs: ordered.  Radiology: ordered.  ECG/medicine tests: ordered.      Risk  OTC drugs.  Prescription drug management.  Decision regarding hospitalization.           Diagnosis:  1. Sepsis with encephalopathy without septic shock, due to unspecified organism (CMS-HCC)    2. Pneumonia of both lungs due to infectious organism, unspecified part of lung    3. Complicated UTI (urinary tract infection)        DISCHARGE MEDICATIONS:  New Prescriptions    No medications on file             Critical Care Time     CRITICAL CARE TIME    I have seen and examined this patient and provided 35 minutes of critical care time exclusive of separately billed procedures and treating other patients.  Critical care was necessary to treat or prevent imminent or life threatening deterioration of the following condition(s): CNS failure or compromise, respiratory failure and sepsis    Critical care time was spent personally by me on the following activities: discussions with consultants, evaluation of patient's resonse to treatment, ordering and performing treatments and interventions, ordering and review of laboratory studies, ordering and review of radiographic studies, re-evaluation of patient's condition and review of old charts          Scribe Attestation  By signing my name below, I, Domingo Cocking, attest that this documentation has been prepared under the  direction and in the presence of Argentina Donovan, MD  Scribe name: Domingo Cocking   Date: 01/24/2022      ----  Attending Physician Attestation  The scribe???s documentation has been prepared under my direction and personally reviewed by me in its entirety. I confirm that the note above accurately reflects all work, treatment, procedures, and medical decision making performed by me.      Loralie Champagne, MD  01/25/22 [phone]

## 2022-01-24 NOTE — Unmapped (Signed)
Bed: A02W  Expected date:   Expected time:   Means of arrival:   Comments:  [place]  70 F  PNA, coarse lung sounds  97% 10L NRB  AAOx 2 baseline per pts husband and SNF staff  138 HR, 150/82

## 2022-01-24 NOTE — Unmapped (Addendum)
[facility] called RN for update. Pt status update provided. ECF staff reports pt is to be NPO.

## 2022-01-24 NOTE — Unmapped (Signed)
Received report from Jenn RN

## 2022-01-24 NOTE — Unmapped (Signed)
Problem: Patient will remain free of injury d/t fall  Goal: High Fall Risk Precautions  Outcome: Progressing  Goal: Additional High Fall Risk Precautions (consider the following)  Outcome: Progressing     Problem: Acute Pain  Description: Patient's pain progressing toward patient's stated pain goal  Goal: Patient displays improved well-being such as baseline levels for pulse, BP, respirations and relaxed muscle tone or body posture  Outcome: Progressing     Problem: Knowledge Deficit  Goal: Patient/family/caregiver demonstrates understanding of disease process, treatment plan, medications, and discharge instructions  Description: Complete learning assessment and assess knowledge base.  Outcome: Progressing     Problem: Incontinence  Goal: Perineal skin integrity is maintained or improved  Description: Assess genitourinary system, perineal skin, labs (urinalysis), and history of incontinence to include past management, aggravating, and alleviating factors.  Collaborate with interdisciplinary team and initiate plans and interventions as needed.  Outcome: Progressing     Problem: Safety  Goal: Patient will be injury free during hospitalization  Description: Assess and monitor vitals signs, neurological status including level of consciousness and orientation. Assess patient's risk for falls and implement fall prevention plan of care and interventions per hospital policy.      Ensure arm band on, uncluttered walking paths in room, adequate room lighting, call light and overbed table within reach, bed in low position, wheels locked, side rails up per policy, and non-skid footwear provided.   Outcome: Progressing  Goal: Patient with weight > 350lbs will have appropriate equipment  Description: Consider ordering Bariatric Bed, Chair and Bedside Commode for patient weight > 350 lbs.  Outcome: Progressing     Problem: Patient will remain free of falls  Goal: Universal Fall Precautions  Outcome: Progressing     Problem:  Daily Care  Goal: Daily care needs are met  Description: Assess and monitor ability to perform self care and identify potential discharge needs.  Outcome: Progressing

## 2022-01-24 NOTE — Unmapped (Signed)
Reconciled signed and held orders -   Order for lovenox and also for sub Q heparin.  Dr. Cipriano MileMamlouk notified.  Per Dr. Cipriano MileMamlouk, dc heparin and keep lovenox order.  Dennie MaizesM. Saturnino Liew RN

## 2022-01-24 NOTE — Unmapped (Signed)
Pt to ED via EMS from [facility] [place]. EMS was called for increased work of breathing and lethargy. Pt recently admitted for AMS and PNA. Per EMS, pt was on 5L NRB when they arrived and was 90%. Placed onto medics 10L NRB at that time. SPO2 = 97% en route. Pt at baseline mental status.

## 2022-01-24 NOTE — Unmapped (Signed)
Pt admitted to room 222 from ER.  Pt is drowsy but easily arousable to voice.  Pt is oriented x 4, slightly delayed responses.  Pt on 4L high flow oxygen.  Foley in place.  Bowel sounds x 4.  Peg tube in place, clamped.        Pt oriented to room and call light.  Pt has no needs at this time.  Bed alarm in place.  Will continue to closely monitor.  Dennie Maizes RN

## 2022-01-25 ENCOUNTER — Inpatient Hospital Stay: Admit: 2022-01-25 | Payer: MEDICARE

## 2022-01-25 LAB — DIFFERENTIAL
Basophils Absolute: 12 /uL (ref 0–200)
Basophils Relative: 0.2 % (ref 0.0–1.0)
Eosinophils Absolute: 64 /uL (ref 15–500)
Eosinophils Relative: 1.1 % (ref 0.0–8.0)
Lymphocytes Absolute: 754 /uL — ABNORMAL LOW (ref 850–3900)
Lymphocytes Relative: 13 % — ABNORMAL LOW (ref 15.0–45.0)
Monocytes Absolute: 429 /uL (ref 200–950)
Monocytes Relative: 7.4 % (ref 0.0–12.0)
Neutrophils Absolute: 4541 /uL (ref 1500–7800)
Neutrophils Relative: 78.3 % (ref 40.0–80.0)
nRBC: 0 /100{WBCs} (ref 0–0)

## 2022-01-25 LAB — CBC
Hematocrit: 30.7 % (ref 35.0–45.0)
Hemoglobin: 10.2 g/dL (ref 11.7–15.5)
MCH: 35.5 pg (ref 27.0–33.0)
MCHC: 33.2 g/dL (ref 32.0–36.0)
MCV: 106.7 fL (ref 80.0–100.0)
MPV: 6.1 fL (ref 7.5–11.5)
Platelets: 307 10*3/uL (ref 140–400)
RBC: 2.88 10*6/uL (ref 3.80–5.10)
RDW: 14.7 % (ref 11.0–15.0)
WBC: 5.8 10*3/uL (ref 3.8–10.8)

## 2022-01-25 LAB — ABO/RH: Rh Type: POSITIVE

## 2022-01-25 LAB — MRSA/STAPH AUREUS DNA - DIAGNOSTIC TESTING FOR PNEUMONIA
MRSA, PCR: NEGATIVE
Staph Aureus, PCR: NEGATIVE

## 2022-01-25 LAB — POC GLU MONITORING DEVICE: POC Glucose Monitoring Device: 83 mg/dL (ref 70–100)

## 2022-01-25 LAB — HIGH SENSITIVITY TROPONIN: High Sensitivity Troponin: 39 ng/L (ref 0–14)

## 2022-01-25 MED ORDER — vancomycin (VANCOCIN) 750 mg in sodium chloride 0.9% 250 mL ADDaptor IVPB
750 | INTRAVENOUS | Status: AC
Start: 2022-01-25 — End: 2022-01-25

## 2022-01-25 MED ORDER — VARIBAR HONEY (barium) suspension 1 teaspoonful
40 | Freq: Once | ORAL | Status: AC | PRN
Start: 2022-01-25 — End: 2022-01-25
  Administered 2022-01-25: 18:00:00 1 [tsp_us] via ORAL

## 2022-01-25 MED ORDER — VARI-BAR (barium) Pste 1 teaspoonful
40 | Freq: Once | ORAL | Status: AC | PRN
Start: 2022-01-25 — End: 2022-01-25
  Administered 2022-01-25: 18:00:00 1 [tsp_us] via ORAL

## 2022-01-25 MED ORDER — barium sulfate (VARIBAR NECTAR) suspension 2 mL
40 | Freq: Once | ORAL | Status: AC | PRN
Start: 2022-01-25 — End: 2022-01-25
  Administered 2022-01-25: 18:00:00 2 mL via ORAL

## 2022-01-25 MED ORDER — VARIBAR THIN (barium) Powd 1 teaspoonful
81 | Freq: Once | ORAL | Status: AC | PRN
Start: 2022-01-25 — End: 2022-01-25
  Administered 2022-01-25: 18:00:00 1 [tsp_us] via ORAL

## 2022-01-25 MED FILL — PIPERACILLIN-TAZOBACTAM 3.375 GRAM INTRAVENOUS SOLUTION: 3.375 3.375 gram | INTRAVENOUS | Qty: 1

## 2022-01-25 MED FILL — MIRTAZAPINE 15 MG TABLET: 15 15 MG | ORAL | Qty: 1

## 2022-01-25 MED FILL — TYLENOL 325 MG TABLET: 325 325 mg | ORAL | Qty: 2

## 2022-01-25 MED FILL — ATORVASTATIN 10 MG TABLET: 10 10 MG | ORAL | Qty: 1

## 2022-01-25 MED FILL — FAMOTIDINE 20 MG TABLET: 20 20 MG | ORAL | Qty: 1

## 2022-01-25 MED FILL — ENOXAPARIN 30 MG/0.3 ML SUBCUTANEOUS SYRINGE: 30 30 mg/0.3 mL | SUBCUTANEOUS | Qty: 0.3

## 2022-01-25 NOTE — Unmapped (Signed)
Spoke with Dr. Debby Bud over phone, ok to start tube feeds per nutrition recommendation tonight. D/c IVF when starting TF.

## 2022-01-25 NOTE — Unmapped (Signed)
Return to [facility]

## 2022-01-25 NOTE — Unmapped (Signed)
[facility]  Care Management/Social Work Assessment      Patient Information     Patient Name: Grissel Tyrell  MRN: [id number]  Hospital Day: 1  Inpatient/Observation: Inpatient  Admit Date: 01/24/2022  Admission Diagnosis: Complicated UTI (urinary tract infection) [N39.0]  Pneumonia of both lungs due to infectious organism, unspecified part of lung [J18.9]  Sepsis with encephalopathy without septic shock, due to unspecified organism (CMS-HCC) [A41.9, R65.20, G93.40]  Attending provider: Kirtland Bouchard, MD    PCP: Alden Server, MD  Home Pharmacy:              [facility] - [place] [place] - [address]  [address]  [place] [address]  Phone: [phone]        Pertinent Medications  Anticoagulation therapy: Yes  Anticoagulant (Name of Drug): Enoxaparin  New Diabetic: No      Issues related to obtaining medications: none noted    Payor Information   Medical Insurance Coverage:  Payor: MEDICARE / Plan: MEDICARE A AND B / Product Type: Medicare /   Secondary Payor: [employer]    Functional Assessment   Functional Assessment  Assessment Information Obtained From:: Spouse, Chart Review (Jen-admissions at [facility])  May We Obtain Collateral Information From Family, Friends and Neighbors?: Yes  Current Mental Status: Unable to Assess (Patient is asleep)  Mental Status Prior to Admission: Unable to Assess  Mental Health History: No (per chart review)  Suicide Attempts: No (per chart review)  Activities of Daily Living: Total Assistance Needed  Work History: Retired  Marital Status: Married  Number of children and their names: Sons; Rosalene Billings  Demographics Correct:: Yes  Discharge Destination: Other (Comment) (SNF)    Current Living Arrangements   Current Living Arrangements  Current Living Arrangements: Skilled Nursing Facility  Skilled Nursing Facility Name/Phone #: [facility] [place] [facility] [phone]  History of Falls?: No (per review)    Electrical engineer  at Home: Not Applicable  Was any abuse reported by patient?:  (did not address)    Support Systems   Emergency contact: Extended Emergency Contact Information  Primary Emergency Contact: Eisner,[name]  Mobile Phone: [phone]  Relation: Spouse  Secondary Emergency Contact: Peggs,aaron  Mobile Phone: [phone]  Relation: Son  Interpreter needed? No    Support Systems  Legal Status: HCPOA  Name of Guardian/POA/ Payee and Phone Number: [name] Rhude; spouse; [phone]; not on file; per spouse; papers are at home somewhere  Primary Caregiver: Facility  Times of available support: Total 24/7 hands on (add comment)  Marital Status: Married  Number of children and their names: Sons; Rosalene Billings  Demographics Correct:: Yes  Discharge Destination: Other (Comment) (SNF)  Next of Kin: as above  Assessment Information Obtained From:: Spouse, Chart Review (Jen-admissions at [facility])    Other Pertinent Information     ?? Patient presented to ED with shortness of breath and tachycardia; diagnosis; Sepsis with encephalopathy; Pneumonia and complicated UTI  ?? Recently discharged to [facility] [facility] on 01/03/22  ?? CM did spouse and he would like for her to return to [facility]. CM did call Candise Bowens at [facility] [place] and verified that patient was SNF; sent referral via Epic and patient can return when medically stable  ?? Patient is NPO ; has tube feeds; Nutritional Consult  ?? Hgb 10.2  ?? Pulmonolgy Consult  ?? IV Antibiotics  ?? Oxygen at 1 liter per NC; recent sat is  98%  ?? Currently, is total care       Discharge Plan     Met with patient to initiate discussion regarding discharge planning. Introduced self and role of case management/social work and provided Tour manager.  Barriers to Discharge  Barriers to Discharge:  (Readmission)    Anticipated Discharge Plan: Return to [facility] [place] [facility]    Anticipated Discharge Date: uncertain    Anticipated Transportation: stretcher    Patient/Family aware and taking  part in the discharge plan.  Patient/family educated that once post-acute care needs have been identified, a provider list applicable to the identified post-acute care needs as well as the insurance provider will be provided, and patient/family have the freedom to choose their provider(s); financial interest(s) are disclosed as appropriate.         Cindi Carbon, RN  Phone Number: [phone]

## 2022-01-25 NOTE — Unmapped (Addendum)
[facility]  History and Physical          Chief Complaint:  SOB    HPI:    Pt is 71 y.o. Female with past medical history of CVA with residual left-sided weakness, dysphagia status post PEG tube, hyperlipidemia, essential hypertension, legally blind who presented to ED from the [facility] with a complaint of increasing shortness of breath and hypoxia.  Patient was found to be high short of breath at the facility with increasing lethargy.  She was also reported to have a fever and generalized weakness that started 2 days ago.  Patient was reportedly requiring supplemental oxygen up to 10 L/min prior to arrival to ED.  In ED patient was found to be hypoxic requiring nasal cannula 4 L/min.  Her chest x-ray shows right middle and right lower lobe infiltrates.  Patient was also found to be febrile in ER with temperature of 102.4 ??F.  Patient was admitted for further management of pneumonia    ROS  Review of 10 systems is negative except what is outlined in HPI.     Past Medical History:   Diagnosis Date   ??? Hypercholesteremia    ??? Hypertension    ??? Stroke (CMS-HCC)         Medications Prior to Admission   Medication Sig Dispense Refill Last Dose   ??? atorvastatin (LIPITOR) 10 MG tablet 1 tablet (10 mg total) by Per G Tube route daily.      ??? enoxaparin (LOVENOX) 40 mg/0.4 mL Syrg Inject 0.4 mLs (40 mg total) subcutaneously daily.      ??? loratadine (CLARITIN) 10 mg tablet 1 tablet (10 mg total) by Per G Tube route daily.      ??? mirtazapine (REMERON) 15 MG tablet Take 1 tablet (15 mg total) by mouth at bedtime. 30 tablet 0    ??? pantoprazole (PROTONIX) 20 MG tablet 1 tablet (20 mg total) every morning before breakfast.      ??? polyethylene glycol (MIRALAX) 17 gram packet 17 g by Per G Tube route daily. 14 packet 0         Family history:  Patient cannot recall     reports that she has never smoked. She has never used smokeless tobacco. She reports that she does not currently use alcohol. She reports that she does  not use drugs.     Allergies:   Allergies   Allergen Reactions   ??? Aspirin    ??? Atenolol    ??? Codeine    ??? Methylprednisone    ??? Sudafed [Pseudoephedrine Hcl]          Physical Exam:    Blood pressure 136/80, pulse 107, temperature 98.1 ??F (36.7 ??C), temperature source Axillary, resp. rate 23, weight (!) 95 lb 0.3 oz (43.1 kg), SpO2 98 %.    General Appearance: pt is chronically ill-appearing, NAD, AAO x 2-3  HEENT: PERRL, EOMI  Skin: Moist and warm  Neck supple, no goiter or LN  Lungs: CTA, No WW/R/R, not labored  Heart : RRR, S1 S2 nl  Abd: soft, NT/ND, normal BS, + PEG tube in place.   Ext: no edema, good peripheral pulses  Neuro:A, OX2-3, general weak    Labs Reviewed   BASIC METABOLIC PANEL - Abnormal; Notable for the following components:       Result Value    Sodium 129 (*)     Chloride 94 (*)     BUN 30 (*)  Creatinine 0.22 (*)     Glucose 106 (*)     Osmolality, Calculated 275 (*)     All other components within normal limits   CBC - Abnormal; Notable for the following components:    RBC 2.86 (*)     Hemoglobin 10.0 (*)     Hematocrit 29.9 (*)     MCV 104.4 (*)     MCH 34.9 (*)     MPV 6.4 (*)     All other components within normal limits   DIFFERENTIAL - Abnormal; Notable for the following components:    Lymphocytes Relative 14.0 (*)     All other components within normal limits   HEPATIC FUNCTION PANEL - Abnormal; Notable for the following components:    Albumin 2.9 (*)     All other components within normal limits   HIGH SENSITIVITY TROPONIN - Abnormal; Notable for the following components:    High Sensitivity Troponin 41 (*)     All other components within normal limits   URINALYSIS W/RFL TO MICROSCOPIC - Abnormal; Notable for the following components:    Clarity, UA Turbid (*)     Protein, UA 100 (*)     Blood, UA Small (*)     Urobilinogen, UA 2.0 (*)     Leukocytes, UA Large (*)     RBC, UA 56 (*)     WBC, UA >100 (*)     WBC Clumps, UA Many (*)     All other components within normal limits    VENOUS BLOOD GAS, LINE/SYRINGE - Abnormal; Notable for the following components:    PH-Line Draw 7.44 (*)     HCO3-Line Draw 33 (*)     CO2 Content-Line Draw 34 (*)     Base Excess-Line Draw 7.5 (*)     Reduced Hemoglobin-Line Draw 28.3 (*)     All other components within normal limits   B NATRIURETIC PEPTIDE - Abnormal; Notable for the following components:    BNP 299 (*)     All other components within normal limits    Narrative:     The presence of high concentrations of biotin may cause falsely lowered BNP results. Biotin interference may be seen if an individual is taking >5 mg biotin per day. Interpret BNP results in the context of the patient's clinical presentation.   HIGH SENSITIVITY TROPONIN - Abnormal; Notable for the following components:    High Sensitivity Troponin 39 (*)     All other components within normal limits   CBC - Abnormal; Notable for the following components:    RBC 2.88 (*)     Hemoglobin 10.2 (*)     Hematocrit 30.7 (*)     MCV 106.7 (*)     MCH 35.5 (*)     MPV 6.1 (*)     All other components within normal limits    Narrative:     AM LABS   DIFFERENTIAL - Abnormal; Notable for the following components:    Lymphocytes Relative 13.0 (*)     Lymphocytes Absolute 754 (*)     All other components within normal limits   LACTIC ACID   PROTIME-INR   PHOSPHORUS   MAGNESIUM   ED ECG 12-LEAD (MUSE)    Narrative:     Ventricular Rate:  136  BPM                  Atrial Rate:  136  BPM  P-R Interval:  116  ms                  QRS Duration:  60  ms                  QT:  292  ms                  QTc:  439  ms                  P Axis:  74  degrees                  R Axis:  0  degrees                  T Axis:  71  degrees                  Diagnosis Line:  INTERPRETATION NOT AVAILABLE--ECG READ IN ER ^ Confirmed by MD, ER ([id number]), editor Napier, Heather ([id number]) on 01/24/2022 6:49:09 PM   ABO/RH   ANTIBODY SCREEN    Narrative:     Testing performed by [facility]   ABO/RH         Diagnostics:     XR PORTABLE CHEST     INDICATION: Fever, unspecified     TECHNIQUE: 1 view of the chest.     COMPARISON: Chest radiograph January 24, 2022     FINDINGS:   Medical Devices: None.     Heart and Mediastinum: Cardiac size is partially obscured, but likely stable.     Lungs and Pleura: Left basilar pleural-parenchymal opacities are slightly improved. Biapical scarring. Slightly increased patchy opacity in the right mid and upper lung zone. Small right pleural effusion suspected. No evidence of pneumothorax.     Bones and soft tissues: No acute abnormalities.     Impression   IMPRESSION:   1. ??Slightly increased patchy opacities in the right mid and lower lung zone may relate to pneumonia or aspiration.   2. ??Mild improvement of left basilar pleural-parenchymal opacities likely representing a left pleural effusion with associated parenchymal disease.          Old records reviewed    Assessment/Plan:      RML/RLL pneumonia, likely aspiration: Continue Zosyn, DC vancomycin, follow-up blood cultures MRSA screen is negative, keep n.p.o. for now, SLP to follow, resume tube feed likely in 24 hours.    Hypoxemia: Secondary to above currently resolved and the patient is on room air.    Possible UTI: Continue Zosyn as above, follow-up urine culture.    Hx of CVA with residual left-sided weakness: Continue statin,?  Not on aspirin.    Dysphagia s/p PEG tube: Tube feed is currently on hold, should be able to resume in 24 hours, continue IV fluids for now, dietitian is consulted for tube feed formula recommendation.    Severe protein calorie malnutrition: Further management as above    Anemia of chronic disease: Stable hemoglobin, monitor for now.  Hyperlipidemia: Continue statin.    Essential hypertension: Continue to monitor off antihypertensive.    GERD: Continue PPI    DVT prophylaxis SQ Lovenox.    PT/OT evaluation          The patient was placed in inpatient status with the expectation that they will require  at least 2 (two) midnights for care and treatment of the clinical conditions identified in the active problem list,  due to  the following co-morbidities and medical history as in HPI, severity of signs and symptoms SOB, or risk of an adverse event such as sepsis and septic shock.     Estimated time the beneficiary requires hospitalization:  3-4  Days        Michelle Ponce  01/25/2022

## 2022-01-25 NOTE — Unmapped (Signed)
Pharmacy Vancomycin Monitoring Service    Michelle Ponce is a 71 y.o. female started on vancomycin. Pharmacy consulted for dosing, monitoring, and adjustment.    Indication for treatment: pneumonia  Goal trough: 15-20 mcg/mL        Wt Readings from Last 3 Encounters:   01/24/22 (!) 95 lb 0.3 oz (43.1 kg)   01/05/22 (!) 96 lb 1.9 oz (43.6 kg)   12/26/21 114 lb 14.4 oz (52.1 kg)          Female patients must weigh at least 45.5 kg to calculate ideal body weight    Pertinent Laboratory Values:       Lab 01/25/22  0326 01/24/22  1653   CREATININE  --  0.22*   BUN  --  30*   WBC 5.8 7.6       Weight used for CrCl calculation: 43.1 kg  Baseline SCr: ~0.2 mg/dL  Current EstCrCl: >16 mL/min    Urine Output:  I/O last 3 completed shifts:  In: 1750 [I.V.:1250; IV Piggyback:500]  Out: 1125 [Urine:1125]    Vital Sign ranges over last 24 hours:  Temp:  [98.4 ??F (36.9 ??C)-102.4 ??F (39.1 ??C)] 100 ??F (37.8 ??C)  Heart Rate:  [105-143] 107  Resp:  [18-28] 23  BP: (120-148)/(80-97) 136/80    Pertinent Cultures:  Date Source Results                              Staph aureus nasal swab results:  No results found for requested labs within last 720 hours.  No results found for requested labs within last 720 hours.    COVID-19 status:   No results found for requested labs within last 720 hours.    Vancomycin concentrations:  No results for input(s): VANCOTROUGH, VANCORANDOM in the last 72 hours.    Date/Time Drawn Concentration (mcg/mL) Comments   TBD @ TBD Pending  TBD hours post-dose, on vancomycin 750 mg q24h       Assessment/Plan:  ?? Michelle Ponce is a 71 y.o. female started on vancomycin for pneumonia  ?? Vancomycin:  ?? Today is day 2 of vancomycin therapy.  ?? Patient received vancomycin loading dose of 1000 mg (~25 mg/kg) x1 on 4/23  ?? Will start vancomycin 750 mg (~15 mg/kg) IV q24h.  ?? No vancomycin level ordered yet.  ?? MRSA nasal swab pending.  ?? Other antimicrobials:  ?? Zosyn 3.375 g IV TID  ?? Pharmacy will continue to monitor  patient and adjust therapy as indicated    Thank you for the consult.    Juliene Kirsh, Pharm.D.    01/25/2022 7:24 AM  Phone: [phone]    For clinical pharmacy assistance on evenings and weekends, please call [phone].

## 2022-01-25 NOTE — Unmapped (Signed)
[facility]  Medical Nutrition Therapy    Reason(s) for Completion: Physician/Nursing Referral - TF    Diet Order/Nutrition Support: Cardiac (low fat, salt, cholesterol)    Admit date:  01/24/2022  Admission diagnosis: Complicated UTI (urinary tract infection) [N39.0]  Pneumonia of both lungs due to infectious organism, unspecified part of lung [J18.9]  Sepsis with encephalopathy without septic shock, due to unspecified organism (CMS-HCC) [A41.9, R65.20, G93.40]  Michelle Ponce, a 71 y.o. female with PMH as below, significant for HTN, HLD, ICH, dysphagia/malnutrition with G-tube placed 3/14 during admission for hip fx after a fall due to severe malnutrition and poor PO intake , recent hip fx after fall s/p repair 12/15/21 at OSH, Legally blind who presented to the ER from nursing facility with SOB, lethargy and tachycardia. Pt recently admitted at [facility] 4/2-5/23 for abd pain and urinary retention.  On recent admit seen by SLP advised NPO with tube feedings.    LOS 1.  Per hard chart: Diet per NH regular, puree with thin liquids.  TF is Isosource 1.5 40 ml/hr x 24 hrs with 100 ml FW q 6 hrs.  Wt stable over past month.  Recs as below.  Na low, 1.1 L urine yesterday.    Patient Active Problem List   Diagnosis   ??? Disorientation   ??? Aspiration into respiratory tract   ??? Moderate protein-calorie malnutrition (CMS-HCC)     Past Medical History:   Diagnosis Date   ??? Hypercholesteremia    ??? Hypertension    ??? Stroke (CMS-HCC)        Scheduled Meds:   ??? atorvastatin  10 mg Per G Tube Daily 0900   ??? enoxaparin  30 mg Subcutaneous Daily 0900   ??? famotidine  20 mg Oral BID   ??? mirtazapine  15 mg Oral Nightly (2100)   ??? piperacillin/tazobactam (ZOSYN) 3.375 gram/NS 100 mL ADV  3.375 g Intravenous 3 times per day      Continuous Infusions:  ??? sodium chloride 0.9 % 100 mL/hr (01/25/22 0009)              Pertinent Labs:   Lab Results   Component Value Date    CREATININE 0.22 (L) 01/24/2022    BUN 30 (H) 01/24/2022     NA 129 (L) 01/24/2022    K 5.1 01/24/2022    CL 94 (L) 01/24/2022    CO2 31 01/24/2022     Lab Results   Component Value Date    MG 2.0 01/24/2022    PHOS 3.4 01/24/2022     Lab Results   Component Value Date    GLUCOSE 106 (H) 01/24/2022     No results found for: PREALBUMIN  No results found for: CRP  No results found for: HGBA1C    Skin Integrity: WDL  Braden Scale Score: 13    Edema:        GI:   Last BM Date: 01/23/22  Abdomen Inspection: Soft, Rounded, G Tube  Bowel Sounds (All Quadrants): Active  Palpation/Percussion: Soft, Nontender  Passing Flatus: Yes    Potential Nutrition Related Factor(s):  Food Allergies/Intolerances: NKFA  Cultural Requests: n/a    71 y.o.   Female   Ht Readings from Last 1 Encounters:   01/03/22 5' 2 (1.575 m)     Wt Readings from Last 15 Encounters:   01/24/22 (!) 95 lb 0.3 oz (43.1 kg)   01/05/22 (!) 96 lb 1.9 oz (43.6 kg)  12/26/21 114 lb 14.4 oz (52.1 kg)      Body mass index is 17.38 kg/m??.   Ideal Body Weight (+/- 10%) 110#  Weight History: as above    Estimated Nutrition Needs:  (Based on clinical status at this time and subject to change)   Needs based On: 43 kg CBW  Kcals/day: 1290 - 1505 (30 - 35 kcal/kg)  Protein g/day: 52 - 65 (1.2 - 1.5 gm/kg)  Carbohydrate g/day: No hx of Diabetes  Fluid ml/day: 1 ml/kcal     Nutrition Related Problems:   Nutrition Diagnosis: Altered GI Function  Related To: dysphagia  As Evidenced By: TF    Recommended Interventions: Initiate Enteral Nutrition  Goals:Tolerate enteral nutrition within 2-4 days                Nutrition Status Classification: Severely Compromised                                                  Follow up per policy while inpatient     Nutrition Transition of Care Plan: Discharge Plan of Care for nutrition (ongoing pending clinical course)    Recommendation(s)/Plan:   1. NPO vs puree thin liquids diet (diet from NH) - per MD  2. Home EN: Isosource 1.5 40 ml/hr x 24 hours (1440 kcal, 65 g protein, 169 g CHO, and 733 mL  FW)  3. FW flush per physician, home regimen is 100 ml q 6 hrs    Barbaraann BoysNick Zareah Hunzeker, RD, LD  Clinical Dietitian

## 2022-01-25 NOTE — Unmapped (Signed)
Pulmonary Consult    Name:  Michelle Ponce Date/Time of Admission: 01/24/2022  4:20 PM    CSN: [id number] Attending Provider: Kirtland Bouchard, MD   Room/Bed:  222/M222 DOB: [birth year] Age: 71 y.o.     REASON FOR PULMONARY CONSULT:  Pneumonia    HPI  Michelle Ponce is a 71 y.o. female with a history of HTN, HLD, ICH, dysphagia/malnutrition with G-tube placed 3/14 during admission for hip fx after a fall due to severe malnutrition and poor PO intake , recent hip fx after fall s/p repair 12/15/21 at OSH, Legally blind who presented to the ER from nursing facility with SOB, lethargy and tachycardia. Pt recently admitted at [facility] 4/2-5/23 for abd pain and urinary retention. Infectious work up negative, abx discontinued. Seen by SLP advised NPO with tube feedings, evaluated by nutritional services due to malnutrition. CXR showing R sided airspace disease concerning for possible aspiration pneumonia, improved L sided airspace disease compared to previous. She is febrile to 102, on 1Lnc O2. Na low, no elevated wbc, elevated BNP. Pt started on zosyn, Vancomycin DC with negative MRSA nares. We have been asked to participate in her care.   Pt alert and oriented just woke up, says she has been getting tube feeds and pureed foods at the facility. She denies any sob, cough, or chest pains. Asking for water.     IMPRESSION:   1. Acute hypoxemia- improved  2. Pneumonia likely aspiration  3. Dysphagia s/p G tube  4. Febrile illness  5. Hx ICH  6. HTN  7. HLD  8. Legally Blind    PLAN:   1.  ABX: zosyn D2  2. Aggressive airway clearance  3. Follow cultures  4. SLP eval  5. Aspiration precautions  6. TF recommendations per nutrition  7. Advise holding IVF and administer free water per G-tube  8. Consider re-consulting Palliative care for goals of care given another admission   9. PT/OT        Electronically signed by: Irven Shelling, CNP 01/25/2022 9:35 AM    Other corroborating history, exam and data:  PAST MEDICAL HISTORY  Past Medical  History:   Diagnosis Date   ??? Hypercholesteremia    ??? Hypertension    ??? Stroke (CMS-HCC)        SURGICAL HISTORY  History reviewed. No pertinent surgical history.    CURRENT HOME MEDICATIONS  Prior to Admission medications    Medication Sig Start Date End Date Taking? Authorizing Provider   atorvastatin (LIPITOR) 10 MG tablet 1 tablet (10 mg total) by Per G Tube route daily.    Historical Provider, MD   enoxaparin (LOVENOX) 40 mg/0.4 mL Syrg Inject 0.4 mLs (40 mg total) subcutaneously daily.    Historical Provider, MD   loratadine (CLARITIN) 10 mg tablet 1 tablet (10 mg total) by Per G Tube route daily.    Historical Provider, MD   mirtazapine (REMERON) 15 MG tablet Take 1 tablet (15 mg total) by mouth at bedtime. 01/06/22   Manoj Thedore Mins, MD   pantoprazole (PROTONIX) 20 MG tablet 1 tablet (20 mg total) every morning before breakfast.    Historical Provider, MD   polyethylene glycol (MIRALAX) 17 gram packet 17 g by Per G Tube route daily. 01/07/22   Melissa Montane, MD       ALLERGIES  Allergies   Allergen Reactions   ??? Aspirin    ??? Atenolol    ??? Codeine    ??? Methylprednisone    ??? Sudafed [  Pseudoephedrine Hcl]        SOCIAL HISTORY  Social History     Socioeconomic History   ??? Marital status: Married     Spouse name: None   ??? Number of children: None   ??? Years of education: None   ??? Highest education level: None   Occupational History   ??? None   Tobacco Use   ??? Smoking status: Never   ??? Smokeless tobacco: Never   Substance and Sexual Activity   ??? Alcohol use: Not Currently   ??? Drug use: Never   ??? Sexual activity: None   Other Topics Concern   ??? None   Social History Narrative   ??? None         FAMILY HISTORY  No family history on file.    ROS: Pt drowsy, answers questions but denies any complaints  CONSTITUTIONAL: No fever/chills or night sweats, no recent weight change. No known sick contacts.  HENT:  No epistaxsis, no nasal drainage, no sore throat.  CARDIAC: No chest pains or palpitations, no lower extremity edema, no  orthopnea.  RESPIRATORY: No shortness of breath, coughing, or wheezing.   GI: No nausea, vomiting, diarrhea, constipation.  No dysphagia, no heartburn, no melena, no hematochezia.  GU: No hematuria or dysuria. No frequent urgency.  MUSCULOSKELETAL: No joint pain.  No Raynaud's Phenomenon.  SKIN: No rashes or lesions.  NEURO: No light headedness, dizziness, syncope, or TIA symptoms.  PSYCHIATRIC: + depression      PHYSICAL EXAM:  VITAL SIGNS:   Vital Sign Range (last 24 hours)  Temp:  [98.1 ??F (36.7 ??C)-102.4 ??F (39.1 ??C)] 98.1 ??F (36.7 ??C)  Heart Rate:  [105-143] 107  Resp:  [18-28] 23  BP: (120-148)/(80-97) 136/80    INTAKE/OUTPUT:  I/O last 3 completed shifts:  In: 1750 [I.V.:1250; IV Piggyback:500]  Out: 1125 [Urine:1125]  Wt Readings from Last 3 Encounters:   01/24/22 (!) 95 lb 0.3 oz (43.1 kg)   01/05/22 (!) 96 lb 1.9 oz (43.6 kg)   12/26/21 114 lb 14.4 oz (52.1 kg)     GEN: No respiratory distress, alert and oriented x3, very thin, chronically ill appearing  HEENT: Atraumatic, normocephalic, pupils equal reactive to light, extraocular movements intact, sclera anicteric, no maxillary or frontal sinus tenderness to palpation,  mucous membranes moist, no oral lesions. Mallampati grade 3.  NECK:  supple, no masses, no lymphadenopathy  CARDIOVASCULAR: Regular Rate and Rhythm, no murmurs gallops or rubs. Radial pulse 2+ and equal.   LUNGS: Diminished to ausculation bilaterally, no wheeze, no rhonchi, no rales, no egophony, no dullness to percussion, minimally congested weak cough  ABDOMEN: Soft, non-tender, nondistended, bowel sounds present  SKIN: Warm, very dry, peeling bilateral LE  EXTREMITIES: No clubbing, no pitting edema.  NEURO: CN 2-12 grossly intact. No gross focal deficits.    PSYCH: Normal affect and mood.    Data:    Recent Results (from the past 24 hour(s))   Basic metabolic panel    Collection Time: 01/24/22  4:53 PM   Result Value Ref Range    Sodium 129 (L) 133 - 146 mmol/L    Potassium 5.1 3.5 -  5.3 mmol/L    Chloride 94 (L) 98 - 110 mmol/L    CO2 31 21 - 33 mmol/L    Anion Gap 4 3 - 16 mmol/L    BUN 30 (H) 7 - 25 mg/dL    Creatinine 1.61 (L) 0.60 - 1.30 mg/dL    Glucose  106 (H) 70 - 100 mg/dL    Calcium 9.1 8.6 - 96.2 mg/dL    Osmolality, Calculated 275 (L) 278 - 305 mOsm/kg    EGFR >90    CBC    Collection Time: 01/24/22  4:53 PM   Result Value Ref Range    WBC 7.6 3.8 - 10.8 10E3/uL    RBC 2.86 (L) 3.80 - 5.10 10E6/uL    Hemoglobin 10.0 (L) 11.7 - 15.5 g/dL    Hematocrit 95.2 (L) 35.0 - 45.0 %    MCV 104.4 (H) 80.0 - 100.0 fL    MCH 34.9 (H) 27.0 - 33.0 pg    MCHC 33.5 32.0 - 36.0 g/dL    RDW 84.1 32.4 - 40.1 %    Platelets 376 140 - 400 10E3/uL    MPV 6.4 (L) 7.5 - 11.5 fL   Differential    Collection Time: 01/24/22  4:53 PM   Result Value Ref Range    Neutrophils Relative 77.1 40.0 - 80.0 %    Lymphocytes Relative 14.0 (L) 15.0 - 45.0 %    Monocytes Relative 6.3 0.0 - 12.0 %    Eosinophils Relative 2.4 0.0 - 8.0 %    Basophils Relative 0.2 0.0 - 1.0 %    nRBC 0 0 - 0 /100 WBC    Neutrophils Absolute 5,860 1,500 - 7,800 /uL    Lymphocytes Absolute 1,064 850 - 3,900 /uL    Monocytes Absolute 479 200 - 950 /uL    Eosinophils Absolute 182 15 - 500 /uL    Basophils Absolute 15 0 - 200 /uL   Hepatic Function Panel    Collection Time: 01/24/22  4:53 PM   Result Value Ref Range    Total Bilirubin 0.3 0.0 - 1.5 mg/dL    Bilirubin, Direct 0.0 0.0 - 0.4 mg/dL    AST 29 13 - 39 U/L    ALT 20 7 - 52 U/L    Alkaline Phosphatase 51 36 - 125 U/L    Total Protein 6.5 6.4 - 8.9 g/dL    Albumin 2.9 (L) 3.5 - 5.7 g/dL    Bilirubin, Indirect 0.3 0.0 - 1.1 mg/dL   Lactic Acid - now and in 2 hours    Collection Time: 01/24/22  4:53 PM   Result Value Ref Range    Lactate 1.2 0.5 - 2.2 mmol/L   Protime-INR    Collection Time: 01/24/22  4:53 PM   Result Value Ref Range    Protime 13.0 12.1 - 15.1 seconds    INR 0.9 0.9 - 1.1   High Sensitivity Troponin    Collection Time: 01/24/22  4:53 PM   Result Value Ref Range    High  Sensitivity Troponin 41 (H) 0 - 14 ng/L   Urinalysis w/Rfl to Microscopic    Collection Time: 01/24/22  4:53 PM   Result Value Ref Range    Color, UA Yellow Yellow,Straw    Clarity, UA Turbid (A) Clear    Specific Gravity, UA 1.019 1.005 - 1.035    pH, UA 7.0 5.0 - 8.0    Protein, UA 100 (A) Negative mg/dL    Glucose, UA Negative Negative mg/dL    Ketones, UA Negative Negative mg/dL    Bilirubin, UA Negative Negative    Blood, UA Small (A) Negative    Nitrite, UA Negative Negative    Urobilinogen, UA 2.0 (H) 0.2 - 1.9 mg/dL    Leukocytes, UA Large (A) Negative    RBC,  UA 56 (H) 0 - 3 /HPF    WBC, UA >100 (H) 0 - 5 /HPF    WBC Clumps, UA Many (A) None Seen /HPF   Venous Blood Gas    Collection Time: 01/24/22  4:53 PM   Result Value Ref Range    PH-Line Draw 7.44 (H) 7.32 - 7.42    PCO2-Line Draw 48 41 - 51 mm Hg    PO2-Line Draw 37 25 - 40 mm Hg    HCO3-Line Draw 33 (H) 24 - 28 mmol/L    CO2 Content-Line Draw 34 (H) 25 - 29 mmol/L    Base Excess-Line Draw 7.5 (H) -2.0 - 3.0 mmol/L    %HBO2-Line Draw 69.2 40.0 - 70.0 %    Carboxyhgb-Line Draw 1.9 %    Methemoglobin-Line Draw 0.6 0.0 - 1.5 %    Reduced Hemoglobin-Line Draw 28.3 (H) 0.0 - 5.0 %   Phosphorus    Collection Time: 01/24/22  4:53 PM   Result Value Ref Range    Phosphorus 3.4 2.1 - 4.5 mg/dL   Magnesium    Collection Time: 01/24/22  4:53 PM   Result Value Ref Range    Magnesium 2.0 1.5 - 2.5 mg/dL   ABO/Rh    Collection Time: 01/24/22  5:01 PM   Result Value Ref Range    ABO Grouping A     Rh Type Positive    Antibody Screen    Collection Time: 01/24/22  5:01 PM   Result Value Ref Range    Antibody Screen Negative    B Natriuretic Peptide    Collection Time: 01/24/22  5:01 PM   Result Value Ref Range    BNP 299 (H) 0 - 100 pg/mL   #1  Blood culture-Peripheral site 1    Collection Time: 01/24/22  5:06 PM    Specimen: Peripheral; Blood   Result Value Ref Range    Culture Result No Growth To Date    #1  Blood culture-Peripheral site 1    Collection Time:  01/24/22  5:15 PM    Specimen: Peripheral; Blood   Result Value Ref Range    Culture Result No Growth To Date    High Sensitivity Troponin    Collection Time: 01/25/22  3:26 AM   Result Value Ref Range    High Sensitivity Troponin 39 (H) 0 - 14 ng/L   CBC    Collection Time: 01/25/22  3:26 AM   Result Value Ref Range    WBC 5.8 3.8 - 10.8 10E3/uL    RBC 2.88 (L) 3.80 - 5.10 10E6/uL    Hemoglobin 10.2 (L) 11.7 - 15.5 g/dL    Hematocrit 62.9 (L) 35.0 - 45.0 %    MCV 106.7 (H) 80.0 - 100.0 fL    MCH 35.5 (H) 27.0 - 33.0 pg    MCHC 33.2 32.0 - 36.0 g/dL    RDW 52.8 41.3 - 24.4 %    Platelets 307 140 - 400 10E3/uL    MPV 6.1 (L) 7.5 - 11.5 fL   Differential    Collection Time: 01/25/22  3:26 AM   Result Value Ref Range    Neutrophils Relative 78.3 40.0 - 80.0 %    Lymphocytes Relative 13.0 (L) 15.0 - 45.0 %    Monocytes Relative 7.4 0.0 - 12.0 %    Eosinophils Relative 1.1 0.0 - 8.0 %    Basophils Relative 0.2 0.0 - 1.0 %    nRBC 0 0 - 0 /100  WBC    Neutrophils Absolute 4,541 1,500 - 7,800 /uL    Lymphocytes Absolute 754 (L) 850 - 3,900 /uL    Monocytes Absolute 429 200 - 950 /uL    Eosinophils Absolute 64 15 - 500 /uL    Basophils Absolute 12 0 - 200 /uL   MRSA/Staph aureus DNA - Diagnostic testing for pneumonia    Collection Time: 01/25/22  7:47 AM    Specimen: Nares Swab   Result Value Ref Range    MRSA, PCR Negative Negative    Staph Aureus, PCR Negative Negative   ABO/Rh    Collection Time: 01/25/22  9:26 AM   Result Value Ref Range    ABO Grouping A     Rh Type Positive        Microbiology  01/25/22 MRSA nares negative  01/24/22 Blood Cx x2 NGTD  Other    Imaging  Echo 2D Complete (TTE)    Result Date: 01/05/2022                            Apryl Chalk                      Department of Cardiology*                        [address]                       [place], [place] [address]                            [phone] Transthoracic Echocardiography Patient:    Lavelle, Berland MR #:       [id number] Account:  Study Date: 01/05/2022 Gender:     F Age:        69 DOB:        07/24/1951 Room:       [facility]  REFERRING    Thedore Mins,  PERFORMING   [facility], [facility]  SONOGRAPHER  Cheryll Cockayne, Manoj K  Ollen Bowl, Brad A  ATTENDING    Thedore Mins, Manoj K  Floy Sabina, Brad A ------------------------------------------------------------------- Procedure:ECHO 2D COMPLETE (TTE)              Order: Accession Number:[id number] ------------------------------------------------------------------- Indications:      (I42.9 Cardiomyopathy- unspecified). ------------------------------------------------------------------- PMH:   Cerebrovascular accident.  Risk factors:  Hypertension.  ------------------------------------------------------------------- Study data:  Height: 62in. 157.5cm. Weight: 95.8lb. 43.5kg.  Study status:  Routine.  Procedure:  Transthoracic echocardiography. Image quality was good. Scanning was performed from the parasternal, apical, and subcostal acoustic windows. Intravenous contrast (Optison) was administered.          Transthoracic echocardiography.  M-mode, complete 2D, complete spectral Doppler, and color Doppler.  Birthdate:  Patient birthdate: 1951/01/13. Age:  Patient is 71yr old.  Sex:  Birth gender: female.  Body mass index:  BMI: 17.6kg/m^2.  Body surface area:    BSA: 1.57m^2.  Blood pressure:     125/65  Patient status:  Inpatient.  Study date: Study date: 01/05/2022. Study time: 12:30 PM.  Location:  Echo laboratory. ------------------------------------------------------------------- Study Conclusions - Left ventricle: The cavity size is normal. Wall thickness was increased in a pattern of moderate   LVH. Systolic  function was normal. The estimated ejection fraction was in the range of 55% to 60%.   Wall motion was normal; there were no regional wall motion abnormalities. Doppler parameters are   consistent with abnormal left ventricular relaxation  (grade 1 diastolic dysfunction). - Aortic valve: Moderate regurgitation. - Mitral valve: Mildly calcified annulus. Mild thickening. Mild regurgitation. - Right ventricle: Systolic function was normal by objective interpretation. - Tricuspid valve: Mild-moderate regurgitation. - Pulmonary arteries: Systolic pressure was mildly increased, estimated to be 35mm Hg assuming that   the right atrial pressure was 3 mmHg. - Pericardium, extracardiac: A trivial pericardial effusion is identified. There is a left pleural   effusion. ------------------------------------------------------------------- Cardiac Anatomy Left ventricle: - The cavity size is normal. Wall thickness was increased in a pattern of moderate LVH. Systolic   function was normal. The estimated ejection fraction was in the range of 55% to 60%. Wall motion   was normal; there were no regional wall motion abnormalities. - Wall motion score: 1.00. - Doppler parameters are consistent with abnormal left ventricular relaxation (grade 1 diastolic   dysfunction). Aorta:   Aortic root: - The aortic root is normal in size. Aortic valve: - Trileaflet; normal thickness leaflets. Mobility was not restricted. Doppler: - Transvalvular velocity is within the normal range. There is no stenosis. Moderate regurgitation. Mitral valve: - Mildly calcified annulus. Mild thickening. Mobility was not restricted. Doppler: - Transvalvular velocity is within the normal range. There is no evidence for stenosis. Mild   regurgitation. Left atrium: - The atrium is normal in size. Pulmonary artery: - Systolic pressure was mildly increased, estimated to be 35mm Hg assuming that the right atrial   pressure was 3 mmHg. Systemic veins: Inferior vena cava: The vessel was normal in size. Right ventricle: - The cavity size is normal. Wall thickness is normal. Systolic function was normal by objective   interpretation. Pulmonic valve:    Doppler: - Transvalvular velocity is within the normal range.  There is no evidence for stenosis. No   regurgitation. Tricuspid valve: - Structurally normal valve. Doppler: - Transvalvular velocity is within the normal range. Mild-moderate regurgitation. Right atrium: - The atrium is normal in size. Pericardium: - A trivial pericardial effusion is identified. There is a left pleural effusion. Systemic veins: Inferior vena cava: - The vessel was normal in size. ------------------------------------------------------------------- Measurements  Left ventricle                Value        Ref  EDD, LAX             (L)      3.4   cm     3.8 - 5.2  ESD, LAX             (N)      2.4   cm     2.2 - 3.5  EDD/bsa, LAX         (N)      2.4   cm/m^2 2.3 - 3.1  ESD/bsa, LAX         (N)      1.7   cm/m^2 1.3 - 2.1  FS, LAX              (N)      29    %      27 - 45  FS, LAX chord        (N)      29    %  27 - 45  ESD                  (N)      2.4   cm     2.2 - 3.5  ESD/bsa              (N)      1.7   cm/m^2 1.3 - 2.1  PW, ED               (H)      1.4   cm     0.6 - 0.9  IVS/PW, ED                    1            ----------  EDV                  (N)      47    ml     46 - 106  ESV                  (N)      20    ml     14 - 42  EF                   (N)      57    %      54 - 74  EDV/bsa              (N)      34    ml/m^2 29 - 61  ESV/bsa              (N)      14    ml/m^2 8 - 24  SV, 1-p A2C                   27    ml     ----------  SV/bsa, 1-p A2C               19.4  ml/m^2 ----------  SV, 1-p A4C                   26    ml     ----------  SV/bsa, 1-p A4C               18    ml/m^2 ----------  E', lat ann, TDI     (L)      6.9   cm/sec >=10.0  E/e', lat ann, TDI            11           ----------  E', med ann, TDI     (L)      5.0   cm/sec >=7.0  E/e', med ann, TDI            14           ----------  E', avg, TDI                  5.9   cm/sec ----------  E/e', avg, TDI       (N)      12           <=14   Ventricular septum            Value        Ref  IVS, ED              (  H)      1.4    cm     0.6 - 0.9   Right ventricle               Value        Ref  TAPSE, MM            (N)      2.1   cm     1.7 - 3.1  S' lateral           (H)      16.2  cm/sec 6.0 - 13.4   Left atrium                   Value        Ref  Area ES, A4C         (N)      13    cm^2   <=20  Area ES, A2C                  15    cm^2   ----------  SI dim, A2C                   5.1   cm     ----------  Vol, ES, 1-p A4C     (N)      29    ml     22 - 52  Vol/bsa, ES, 1-p A4C (N)      21    ml/m^2 11 - 40  Vol, ES, 1-p A2C     (N)      37    ml     22 - 52  Vol/bsa, ES, 1-p A2C (N)      26    ml/m^2 13 - 40  Vol, ES, 2-p                  34    ml     ----------  Vol/bsa, ES, 2-p     (N)      25    ml/m^2 16 - 34   Right atrium                  Value        Ref  Area, ES, A4C        (L)      9     cm^2   10 - 18   Aortic valve                  Value        Ref  Peak v, S                     1.4   m/sec  ----------  AR peak v                     5.34  m/sec  ----------  AR PHT                        344   ms     ----------  AR peak grad                  114   mm Hg  ----------   Mitral valve                  Value  Ref  Peak E                        0.72  m/sec  ----------  Peak A                        0.94  m/sec  ----------  Decel time                    99    ms     ----------  Peak grad, D                  2     mm Hg  ----------  Peak E/A ratio                0.8          ----------   Pulmonic valve                Value        Ref  Peak v, S                     0.8   m/sec  ----------   Tricuspid valve               Value        Ref  TR peak v            (N)      2.8   m/sec  <=2.8  Peak RV-RA grad, S            31    mm Hg  ----------  Max TR vel                    2.05  m/sec  ----------  Legend: (L)  and  (H)  mark values outside specified reference range. (N)  marks values inside specified reference range. Reviewed and confirmed by Lionel December 2023-04-04T16:28:49    CT Abdomen and Pelvis WO IV contrast    Result Date:  01/03/2022  EXAM: CT CHEST WO CONTRAST EXAM: CT ABDOMEN AND PELVIS WO CONTRAST INDICATION: Cough, chronic/persisting > 8 weeks, failed empiric treatment,UTI, recurrent/complicated (Female) COMPARISON: Chest radiograph from the same day and CT abdomen pelvis from 12/26/2021. TECHNIQUE: Thin section CT images were obtained from the lung apices through the pelvis without contrast. Coronal and sagittal MPR was performed at the scanner. FIELD OF VIEW: 38 cm. FINDINGS: CHEST: Lower Neck: Unremarkable. Heart: The heart is not enlarged. Moderate to severe coronary artery calcifications. Mitral annular calcifications. Vascular Structures: Unremarkable. Mediastinum And Hila: No lymphadenopathy. Central airways: Central secretions most significant in the right mainstem bronchus with scattered subsegmental mucous plugging Pleura: Small right and moderate left pleural effusions. Lungs: Left greater than right passive atelectasis. Patchy peripheral predominant on the right and posterior predominant on the left patchy consolidations with scattered bandlike opacities. Right basilar predominant smooth septal thickening. Chest Wall: Anasarca. Osseous Structures: No acute osseous abnormalities or suspicious osseous lesions. Subacute sixth through eighth anterior right rib fractures. ABDOMEN/PELVIS: Liver: Noncirrhotic in morphology.  No focal liver lesions within the limitations of a noncontrast exam. Biliary Tree: No intra or extra-hepatic biliary ductal dilatation. Spleen: Normal Pancreas: Normal Adrenal Glands: Unchanged right adrenal nodule. Kidneys/Ureters/Bladder: No focal renal lesions.  No hydronephrosis. The urinary bladder is not well visualized due to streak artifact from left hip arthroplasty. Gastrointestinal Tract: Gastrostomy tube terminates  at the body/antrum junction. Gastric, small bowel, colonic caliber and wall thickness are within normal limits. The appendix is not definitively visualized. Lymphatics: No abdominal or  pelvic lymph nodes are enlarged by size criteria. Vasculature: Scattered atherosclerotic plaque without aneurysmal dilatation. Peritoneum/Retroperitoneum: Small volume ascites. No free air or loculated fluid collections. Abdominal Wall/Soft Tissues: Unremarkable. Genital Structures: The lower pelvis is not well visualized due to streak artifact from left hip arthroplasty. Osseous Structures: No acute osseous abnormalities or suspicious osseous lesions. Levocurvature of the lumbar spine, which may be exaggerated due to positioning. Postsurgical changes associated with left hip arthroplasty. Partially visualized hardware appears intact without periprosthetic lucency. Moderate right hip right cirrhosis. Subacute or chronic fracture of the left obturator ring.     IMPRESSION: CHEST 1.  Central secretions most significant in the right mainstem bronchus with scattered subsegmental mucous plugging, which may related to aspiration. 2.  Small right and moderate left pleural effusions with associated passive atelectasis. 3.  Bilateral patchy consolidations, which may related to multifocal pneumonia or aspiration. 4.  Right basilar predominant smooth septal thickening, which may related to pulmonary edema. ABDOMEN/PELVIS 1.  Small volume ascites, otherwise no acute abnormality of the abdomen and pelvis identified within the limits of a noncontrast exam. Approved by Carey Bullocks, MD on 01/03/2022 9:46 PM EDT I have personally reviewed the images and I agree with this report. Report Verified by: Zane Herald, MD at 01/03/2022 9:57 PM EDT    X-ray Abdomen AP view    Result Date: 12/26/2021  EXAM: XR ABDOMEN AP VIEW ONLY INDICATION: sbo TECHNIQUE: 2 supine views the abdomen and pelvis. COMPARISON: None Available. FINDINGS: Visualized lungs are clear. A gastrostomy tube projects over the left upper quadrant. There is a relative paucity of small bowel gas. No colonic distention. Moderate colonic stool burden. Postsurgical changes of left  hip hemiarthroplasty. Moderate right hip osteoarthritis. Scoliotic curvature of the thoracolumbar spine.     IMPRESSION:  Nonspecific bowel gas pattern with moderate colonic stool burden. Report Verified by: William Hamburger, MD at 12/26/2021 1:41 PM EDT    CT Chest WO contrast    Result Date: 01/03/2022  EXAM: CT CHEST WO CONTRAST EXAM: CT ABDOMEN AND PELVIS WO CONTRAST INDICATION: Cough, chronic/persisting > 8 weeks, failed empiric treatment,UTI, recurrent/complicated (Female) COMPARISON: Chest radiograph from the same day and CT abdomen pelvis from 12/26/2021. TECHNIQUE: Thin section CT images were obtained from the lung apices through the pelvis without contrast. Coronal and sagittal MPR was performed at the scanner. FIELD OF VIEW: 38 cm. FINDINGS: CHEST: Lower Neck: Unremarkable. Heart: The heart is not enlarged. Moderate to severe coronary artery calcifications. Mitral annular calcifications. Vascular Structures: Unremarkable. Mediastinum And Hila: No lymphadenopathy. Central airways: Central secretions most significant in the right mainstem bronchus with scattered subsegmental mucous plugging Pleura: Small right and moderate left pleural effusions. Lungs: Left greater than right passive atelectasis. Patchy peripheral predominant on the right and posterior predominant on the left patchy consolidations with scattered bandlike opacities. Right basilar predominant smooth septal thickening. Chest Wall: Anasarca. Osseous Structures: No acute osseous abnormalities or suspicious osseous lesions. Subacute sixth through eighth anterior right rib fractures. ABDOMEN/PELVIS: Liver: Noncirrhotic in morphology.  No focal liver lesions within the limitations of a noncontrast exam. Biliary Tree: No intra or extra-hepatic biliary ductal dilatation. Spleen: Normal Pancreas: Normal Adrenal Glands: Unchanged right adrenal nodule. Kidneys/Ureters/Bladder: No focal renal lesions.  No hydronephrosis. The urinary bladder is not well visualized  due to streak artifact from left hip arthroplasty. Gastrointestinal Tract:  Gastrostomy tube terminates at the body/antrum junction. Gastric, small bowel, colonic caliber and wall thickness are within normal limits. The appendix is not definitively visualized. Lymphatics: No abdominal or pelvic lymph nodes are enlarged by size criteria. Vasculature: Scattered atherosclerotic plaque without aneurysmal dilatation. Peritoneum/Retroperitoneum: Small volume ascites. No free air or loculated fluid collections. Abdominal Wall/Soft Tissues: Unremarkable. Genital Structures: The lower pelvis is not well visualized due to streak artifact from left hip arthroplasty. Osseous Structures: No acute osseous abnormalities or suspicious osseous lesions. Levocurvature of the lumbar spine, which may be exaggerated due to positioning. Postsurgical changes associated with left hip arthroplasty. Partially visualized hardware appears intact without periprosthetic lucency. Moderate right hip right cirrhosis. Subacute or chronic fracture of the left obturator ring.     IMPRESSION: CHEST 1.  Central secretions most significant in the right mainstem bronchus with scattered subsegmental mucous plugging, which may related to aspiration. 2.  Small right and moderate left pleural effusions with associated passive atelectasis. 3.  Bilateral patchy consolidations, which may related to multifocal pneumonia or aspiration. 4.  Right basilar predominant smooth septal thickening, which may related to pulmonary edema. ABDOMEN/PELVIS 1.  Small volume ascites, otherwise no acute abnormality of the abdomen and pelvis identified within the limits of a noncontrast exam. Approved by Carey Bullocks, MD on 01/03/2022 9:46 PM EDT I have personally reviewed the images and I agree with this report. Report Verified by: Zane Herald, MD at 01/03/2022 9:57 PM EDT    MRI Head W WO contrast    Result Date: 01/05/2022  EXAM: MRI BRAIN W WO CONTRAST INDICATION: Mental status change,  unknown cause TECHNIQUE:  MRI brain performed including multiplanar T1 and T2 weighted sequences. DWI and SWAN imaging was also performed. Imaging was obtained prior to and after 5 mL of GADOBUTROL 1 MMOL/ML INTRAVENOUS SOLUTION [facility]) administered intravenously. COMPARISON: None available. FINDINGS: Mild ventricular enlargement, right greater than left, related to prior infarction hemorrhage. Moderate prominence basal cisterns, fissures, sulci. Multiple foci of susceptibility basal ganglia, cerebellum, with hemosiderin lined remote hematoma cavity of the right putamen and corona radiata. Multiple foci with periventricular confluence T2/FLAIR hyperintensity. Lacunar changes left putamen. Small focus diffusion restriction splenium corpus callosum. Orbits, paranasal sinuses, and mastoid regions: No acute orbital abnormality. Clear paranasal sinuses. Aerated mastoid air cells. Left globe banding procedure. Vascular structures: Normal arterial and venous flow voids. Other: Normal included extracranial structures. Upper cervical spine without significant abnormality.     IMPRESSION: 1. Foci and confluent areas T2/FLAIR hyperintensity with lacunar change, and multifocal susceptibility, consistent with chronic small vessel disease. 2. Remote right putamen/corona radiata hematoma cavity 3. Small focus diffusion restriction is splenium corpus callosum. Report Verified by: Burt Ek, MD at 01/05/2022 8:35 AM EDT    CT Abdomen and Pelvis With IV contrast    Result Date: 01/04/2022  EXAM: CT ABDOMEN AND PELVIS WITH IV CONTRAST INDICATION: LLQ abdominal pain, Abdominal pain, acute, nonlocalized TECHNIQUE: CT of the abdomen and pelvis was performed after the administration of intravenous contrast. Axial images were obtained with coronal and sagittal reconstructions. CONTRAST: 125 mL of IOHEXOL 350 MG IODINE/ML INTRAVENOUS SOLUTION administered intravenously FIELD OF VIEW: 38 cm COMPARISON: 01/03/2022 FINDINGS: Lower Chest: Moderate  left and small right pleural effusions with adjacent atelectasis. Liver: Normal liver contour. No focal hepatic lesions. Biliary Tree: No intra or extra-hepatic biliary ductal dilatation. The gallbladder is unremarkable. Spleen: Unremarkable Pancreas: Unremarkable Adrenal Glands: 1.3 cm right adrenal nodule demonstrates unenhanced characteristics most compatible with a benign lipid rich adrenal adenoma  on the recent noncontrast chest CT. The left adrenal gland is normal. Kidneys/Ureters/Bladder: No dural multifocal renal cortical scarring. No suspicious renal lesions. No hydronephrosis. Urinary bladder is collapsed around a Foley catheter Gastrointestinal Tract: Percutaneous gastrostomy tube is in expected position. Large colonic stool burden. Bowel caliber and wall thickness are otherwise within normal limits. The appendix is normal. Lymphatics: No abdominal or pelvic lymph nodes are enlarged by size criteria. Vasculature: Moderate atherosclerotic plaque without aneurysmal dilatation of the abdominal aorta. 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 Incidentally noted left drain the left renal vein. Peritoneum/Retroperitoneum: Small volume ascites. No rim-enhancing fluid collections. No free air. Abdominal Wall/Soft Tissues: Severe diffuse body wall edema. Genital Structures: Uterus is present. No adnexal masses. Osseous Structures: No acute osseous abnormalities or suspicious osseous lesions. Convex left thoracolumbar scoliosis. Left hip arthroplasty. Moderate to severe degenerative changes in the right hip. Nonacute left obturator ring fracture deformity.     IMPRESSION: 1.  Severe anasarca including a small volume of ascites with moderate left and small right pleural effusions 2.  Large stool burden 3.  Left greater than right pelvic varices. Recommend correlation with any signs or symptoms of pelvic congestion syndrome. Report Verified by: Annett Fabian, MD at  01/04/2022 4:56 PM EDT    CT Abdomen and Pelvis With IV contrast    Result Date: 12/26/2021  EXAM: CT ABDOMEN AND PELVIS WITH IV CONTRAST INDICATION: Abdominal distention, G-tube placement 9 days prior and left femoral fracture repair 12 days prior TECHNIQUE: CT of the abdomen and pelvis was performed after the administration of intravenous contrast. Axial images were obtained with coronal and sagittal reconstructions. CONTRAST: 100 mL of IOHEXOL 350 MG IODINE/ML INTRAVENOUS SOLUTION administered intravenously FIELD OF VIEW: 33.6 cm COMPARISON: Abdominal radiograph 1 hour prior. FINDINGS: Lower chest: Mild bibasilar parenchymal opacities. Left basilar atelectasis. Small left pleural effusion. No suspicious pulmonary nodules. Normal heart size. No pericardial effusion. Liver: Smooth contour. No enhancing lesions. Few scattered subcentimeter hypoattenuating lesions that are too small to characterize. Biliary tree/Gallbladder: Contracted gallbladder. No cholelithiasis, gallbladder wall thickening, or pericholecystic fluid. No intra or extrahepatic biliary ductal dilation. Spleen: Normal. Pancreas: Normal. Adrenal glands: Nonenhancing right adrenal nodule measuring 1.3 x 1.3 cm in axial cross section (series 301, image 24). Normal left adrenal gland. Kidneys/Ureters/Bladder: Symmetric cortical enhancement bilaterally. No enhancing lesions. No hydronephrosis. Collapsed urinary bladder in the setting of a Foley catheter. Gastrointestinal tract: Percutaneous gastrostomy tube tip within the gastric body. Large and small bowel bowel are normal in caliber and wall thickness. The appendix is not definitively visualized. No inflammatory changes to suggest acute appendicitis. Lymphatics: No lymphadenopathy. Vasculature: Calcified atherosclerosis of the normal caliber abdominal aorta and its major branches, including plaques causing mild to moderate stenosis of the bilateral renal arteries and SMA ostium. Peritoneum/Retroperitoneum:  No free air or free fluid. Abdominal wall/soft tissues: Diffuse edema surrounding the left hip. Multiple foci of soft tissue gas are present, likely postsurgical related to recent repair. Genital Organs: Atrophic uterus. Osseous structures: Postsurgical changes of left hip hemiarthroplasty without evidence of hardware complication. Moderate to severe degenerative changes of the right hip joint. Severe levoscoliosis and chronic degenerative changes of the thoracolumbar spine. Chronic chest wall deformity with remote rib fractures. Remote left superior pubic ramus and pubic body fracture. Severe right hip osteoarthritis.     IMPRESSION: 1.  No acute findings in the abdomen or pelvis. 2.  Soft tissue edema and gas surrounding the left hip, likely  related to recent hemiarthroplasty. 3.  Incidental 1.3 cm right adrenal nodule, likely an adenoma. Consider comparison with prior imaging or 12 month follow up as clinically indicated. 4.  Calcified atherosclerosis causing mild to moderate stenosis of the bilateral renal arteries and SMA ostium. I have personally reviewed the images and I agree with this report. Report Verified by: William Hamburger, MD at 12/26/2021 3:45 PM EDT    X-ray Portable Chest    Result Date: 01/24/2022  EXAM: XR PORTABLE CHEST INDICATION: Fever, unspecified TECHNIQUE: 1 view of the chest. COMPARISON: Chest radiograph January 24, 2022 FINDINGS: Medical Devices: None. Heart and Mediastinum: Cardiac size is partially obscured, but likely stable. Lungs and Pleura: Left basilar pleural-parenchymal opacities are slightly improved. Biapical scarring. Slightly increased patchy opacity in the right mid and upper lung zone. Small right pleural effusion suspected. No evidence of pneumothorax. Bones and soft tissues: No acute abnormalities.     IMPRESSION: 1.  Slightly increased patchy opacities in the right mid and lower lung zone may relate to pneumonia or aspiration. 2.  Mild improvement of left basilar  pleural-parenchymal opacities likely representing a left pleural effusion with associated parenchymal disease. Report Verified by: Dagmar Hait, MD at 01/24/2022 5:14 PM EDT    X-ray Portable Chest    Result Date: 01/03/2022  EXAM: XR PORTABLE CHEST INDICATION: Chest pain, unspecified TECHNIQUE: 1 view of the chest. COMPARISON: 12/26/2021 FINDINGS: Medical Devices: None. Heart and Mediastinum: Cardiomediastinal silhouette is within normal limits. Lungs and Pleura: Patchy bilateral airspace disease greatest at the left lung base with small left pleural effusion. No pneumothorax.. No evidence for pneumothorax. Bones and soft tissues: No acute abnormalities.     IMPRESSION: Patchy airspace disease greatest at the left lung base which may reflect aspiration or pneumonia. Report Verified by: Zane Herald, MD at 01/03/2022 4:06 PM EDT

## 2022-01-25 NOTE — Unmapped (Signed)
Speech Language Pathology  Clinical Swallow Assessment and Tentative Discharge    Name: Michelle Ponce  DOB: 05/16/1951  Attending Physician: Kirtland Bouchard, MD  Admission Diagnosis: Complicated UTI (urinary tract infection) [N39.0]  Pneumonia of both lungs due to infectious organism, unspecified part of lung [J18.9]  Sepsis with encephalopathy without septic shock, due to unspecified organism (CMS-HCC) [A41.9, R65.20, G93.40]  Date: 01/25/2022  Precautions: aspiration; fall  Reviewed Pertinent hospital course: Yes  Hospital Course SLP: Per H&P 01/25/22: Pt is 71 y.o. Female with past medical history of CVA with residual left-sided weakness, dysphagia status post PEG tube, hyperlipidemia, essential hypertension, legally blind who presented to ED from the [facility] with a complaint of increasing shortness of breath and hypoxia.  Patient was found to be high short of breath at the facility with increasing lethargy.  She was also reported to have a fever and generalized weakness that started 2 days ago.  Patient was reportedly requiring supplemental oxygen up to 10 L/min prior to arrival to ED.  In ED patient was found to be hypoxic requiring nasal cannula 4 L/min.  Her chest x-ray shows right middle and right lower lobe infiltrates.  Patient was also found to be febrile in ER with temperature of 102.4 ??F.  Patient was admitted for further management of pneumonia.    Imaging: Chest xray 01/24/22: 1.  Slightly increased patchy opacities in the right mid and lower lung zone may relate to pneumonia or aspiration.   2.  Mild improvement of left basilar pleural-parenchymal opacities likely representing a left pleural effusion with associated parenchymal disease.     SLP Hx: Patient most recently seen by SLP 01/04/22-01/05/22.  She was on considered to be at risk for aspiration, had concerns for aspiration with emesis, and was peg tube dependent.  Patient had use of peg tube for nutrition and a puree diet at her facility.  She  currently endorses difficulty swallowing at night, and usually has an oral breakfast.  Patient stated that she is on a puree diet due to difficulty chewing solids.  Assessment complete?: Yes    Assessment: Patient presents with increased risk of oropharyngeal dysphagia secondary to cognitive status and medical co-morbidities. There were no clinical signs of aspiration observed with thin liquids, and no signs or symptoms with puree. However, patient with overall perioral weakness and chest imaging is suggestive of possible aspiration.  Recommend Dysphagia 1 (Puree) with thin liquids and obtain MBS. MBS is warranted to further evaluate pharyngeal swallow function, trial compensatory strategies and to determine specific targets for treatment.    Plan/Recommendation:   1. Diet: Dysphagia 1 (Puree) with thin liquids  2. Safe Swallowing Strategies: Upright, Small bites/sips, Slow rate and Supervision with meals  3. SLP therapy frequency: minimum 2x/week while inpatient  4. SLP at discharge is recommended pending clinical progress  5.   MBS    Orientation:  Person: Yes    Pain:  Pain Score: 0 - No Pain    Aspiration Risk  Risk for Aspiration: Moderate  Aspiration Risk Recommendations: Dysphagia treatment, Modified barium swallow study  Dysphagia Diagnosis: suspect pharyngeal dysphagia, oral dysphagia  Compensatory Swallowing Strategies: Upright as possible for all oral intake, Eat/feed slowly, Small bites/sips  Diet Solids Recommendation: Dysphagia 1  Diet Liquids Recommendations: Thin only (further recommendations pending MBSS results)        Goals  Goals Short Term Dysphagia  Patient's goal is: to have water    Patient and/or caregiver will demonstrate understanding of clinical signs  and symptoms of aspiration at 80% accuracy:     Patient and/or caregiver will demonstrate understanding of risks associated with aspiration at 100% accuracy:     Patient will participate in ongoing assessment at 100% to further evaluate  swallowing to include MBSS:    Time frame for goals to be met in: at least 2x/week for two weeks (02/08/22)    Goals Long Term Dysphagia    Patient will tolerate the least restrictive diet as determined via instrumental assessment with 100% accuracy independently:     Time frame for goals to be met in: at least 2x/week for three weeks (02/15/22)        Recommendation  Recommendations  Recommendation: SLP recommendation pending clinical progress  SLP Treatment Details: rec MBSS; D1/thin with peg; has recurrent aspiration on chest imaging    Baseline Assessment  History of Intubation: No  Behavior/Cognition: Alert;Cooperative;Fatigued  Dentition: Adequate  General appearance of oral cavity: clean, pink, and moist  Baseline diet: puree and thin liquids with use of peg tube for nutrition  Current diet: cardiac diet  Vision: Impaired for self-feeding  Patient Positioning: Upright in bed  Volitional Cough: Strong  Volitional Swallow: Delayed    Respiratory Status  Respiratory Status: Room air  SpO2: 89-92    Cranial Nerve & Laryngeal Function Exam  Mandibular strength: Impaired    Facial symmetry: WFL  Labial strength: WFL    CNIX - Glossopharyngeal: Impaired  Velar elevation: WFL    CNX - Vagus: Impaired  Vocal quality: weak  Vocal intensity: reduced  Intelligibility: intelligible    Lingual ROM: WFL  Lingual strength: reduced  Lingual deviation: WFL    Dentition and Hearing  Dentition: Adequate  Hearing Exceptions: None    Consistencies Assessed  Thin;Puree    Thin  Presentation: Straw  Oral: Within functional limits  Pharyngeal: No overt s/s of aspiration;Laryngeal elevation appreciated upon palpation    Puree  Presentation: Spoon  Oral: Within functional limits  Pharyngeal: No overt s/s of aspiration        Patient and Family Education  Patient and Family Education: Patient educated on: role of SLP, risks of aspiration, instrumental procedure, current diet recommendations  Education response:  Patient verbalized  understanding and is agreeable to MBSS, I'll do what I need to do.  No family present.    If patient discharges prior to next therapy treatment session, this note will serve as the discharge. Goals as above. All goals were not met d/t discharge prior to meeting goals. No equipment issued. Education as indicated above.     Patient was left semi-reclined in bed with call light within reach and all needs met. Safety handoff completed with RN/Svetlana who verbalized understanding.  Consent to order MBSS given by CNP Harriett SineNancy.      Time  Start Time: 1156  Stop Time: 1212  Time Calculation (min): 16 min    Charges   $Clinical Swallow: 1 Procedure         Patient Class   Inpatient    Isacc Turney MACCC/SLP  Speech-Language Pathologist  [facility]  [facility]  01/25/2022  [phone] or [phone]

## 2022-01-25 NOTE — Unmapped (Signed)
[facility]  Modified Barium Swallow Study (MBS) and Tentative Discharge    Name: Michelle Ponce  DOB: 10/16/1950  Attending Physician: Kirtland Bouchard, MD  Admission Diagnosis: Complicated UTI (urinary tract infection) [N39.0]  Pneumonia of both lungs due to infectious organism, unspecified part of lung [J18.9]  Sepsis with encephalopathy without septic shock, due to unspecified organism (CMS-HCC) [A41.9, R65.20, G93.40]  Date: 01/25/2022  Precautions: aspiration; fall; allergy  Reviewed Pertinent hospital course: Yes     Hospital Course SLP: Per H&P 01/25/22: Pt is 71 y.o. Female with past medical history of CVA with residual left-sided weakness, dysphagia status post PEG tube, hyperlipidemia, essential hypertension, legally blind who presented to ED from the [facility] with a complaint of increasing shortness of breath and hypoxia.  Patient was found to be high short of breath at the facility with increasing lethargy.  She was also reported to have a fever and generalized weakness that started 2 days ago.  Patient was reportedly requiring supplemental oxygen up to 10 L/min prior to arrival to ED.  In ED patient was found to be hypoxic requiring nasal cannula 4 L/min.  Her chest x-ray shows right middle and right lower lobe infiltrates.  Patient was also found to be febrile in ER with temperature of 102.4 ??F.  Patient was admitted for further management of pneumonia.    Imaging: Chest xray 01/24/22: 1.  Slightly increased patchy opacities in the right mid and lower lung zone may relate to pneumonia or aspiration.   2.  Mild improvement of left basilar pleural-parenchymal opacities likely representing a left pleural effusion with associated parenchymal disease.     SLP Hx: Patient most recently seen by SLP 01/04/22-01/05/22.  She was on considered to be at risk for aspiration, had concerns for aspiration with emesis, and was peg tube dependent.  Patient had use of peg tube for nutrition and a puree diet at her  facility.  She currently endorses difficulty swallowing at night, and usually has an oral breakfast.  Patient stated that she is on a puree diet due to difficulty chewing solids.  Assessment complete?: Yes      Assessment: Patient presents with mild oropharyngeal dysphagia secondary to perioral weakness, delayed swallow initiation, and reduced laryngeal excursion, complicated by medical co-morbidities resulting in spillover of bolus before the swallow and mild vallecular, tongue base, and posterior pharyngeal wall residuals.  There was no laryngeal penetration nor aspiration on any given consistency, however patient is considered to be at an increased risk for aspiration secondary to overall weakness and medical co-morbidities.  Recommend a puree/dysphagia 1 diet (baseline) and thin liquids with use of strict aspiration precautions.  Of note, patient with significant right head tilt and required support of pillows for neutral head position.         Recommendations:  Diet: Dysphagia 1 (puree) with Thin liquids with continued use of tube feeds for nutrition if deemed necessary for adequate for nutrition and hydration  Medication Administration: via peg tube and/or crushed if able  Supervision: 1:1 Supervision/Assistance  Safe Swallowing Strategies: Upright, Small bites/sips and Slow rate  Compensatory Strategies: n/a    Plan:  1. SLP therapy frequency: minimum 2x/week while inpatient  2. SLP at discharge is not anticipated    Subjective/Background:  Alert and Oriented x1.  Pain level : no reports of pain; patient with very few verbalizations      Dysphagia History  Previous swallowing assessments (MBS, FEES, BSE, none): bedside assessments 01/04/22 and 01/25/22  Respiratory Status:  room air    Dentition:  Adequate    Reason for Referral:   Patient was referred for an MBS to assess swallow function and efficiency, rule out aspiration and make recommendations regarding safe dietary consistencies, effective  compensatory strategies, and safe eating environment.     Patient Positioning:  lateral view    PO Trials & Pen/Asp Score:    Thin  Pen/Asp Score: 1- Material does not enter the airway    Nectar  Pen/Asp Score: 1- Material does not enter the airway    Honey  Pen/Asp Score: 1- Material does not enter the airway    Puree  Pen/Asp Score: 1- Material does not enter the airway    No solids assessed as patient reported poor chewing ability and is on a puree diet at baseline.      Pen Asp Score           Description of Events  1             Material does not enter airway  2          Material enters the airway, remains above the vocal folds,                    and is ejected from the airway.  3           Material enters the airway, remains above the vocal folds,                    and is not ejected from the airway.  4              Material enters the airway, contacts the vocal folds,                    and is ejected from the airway.  5           Material enters the airway, contacts the vocal folds,                    and is not ejected from the airway.  6           Material enters the airway, passes below the vocal folds,                and is ejected into the larynx or out of the airway.  7           Material enters the airway, passes below the vocal folds,                   and is not ejected from the trachea despite effort.  8           Material enters the airway, passes below the vocal folds,                   and no effort is made to eject.    Oral Phase  Lip Closure: no labial escape  Tongue Control/Bolus Hold: posterior escape of bolus  Bolus Prep/Mastication: n/a; no solids attempted  Onset of Pharyngeal Swallow: bolus head in pyriforms    Pharyngeal Phase  Soft Palate Elevation: no bolus between soft palate and pharyngeal wall  Hyolaryngeal Elevation/Excursion: reduced  Epiglottic Movement: complete inversion  Laryngeal Vestibule Closure: complete  Pharyngeal Constriction: complete  Laryngeal Penetration: not  observed  Aspiration: not observed    Cervical Esophagus  WFL    Goals:  Goals Short Term Dysphagia  Patient's goal is: to have water  ??  Patient and/or caregiver will demonstrate understanding of clinical signs and symptoms of aspiration at 80% accuracy:   ??  Patient and/or caregiver will demonstrate understanding of risks associated with aspiration at 100% accuracy:   ??  Patient will participate in ongoing assessment at 100% to further evaluate swallowing to include MBSS: Current: MBSS 01/25/22.  Goal met  ??  Time frame for goals to be met in: at least 2x/week for two weeks (02/08/22)  ??  Goals Long Term Dysphagia  ??  Patient will tolerate the least restrictive diet as determined via instrumental assessment with 100% accuracy independently:   ??  Time frame for goals to be met in: at least 2x/week for three weeks (02/15/22)    Education:  Patient was educated on results of MBSS and diet recommendations.  Patient stated, Okay, but no other response to education.    If patient discharges prior to next therapy treatment session, this note will serve as the discharge. Goals as above. All goals were not met d/t discharge prior to meeting goals. No equipment issued. Education as indicated above.     All images of the MBS are available for viewing under the [employer] under the images tab.     Patient left in care of transport returning to floor.  Safety hand off with results and recommendations send via epic secure chat to RN/Svetlana, MD, and pulmonology CNP.      Patient Class  Inpatient    Time  Start Time: 1327  Stop Time: 1403  Time Calculation (min): 36 min    Charges   $ MBS Fluoro Eval: 1 Procedure    Soil scientist Pathologist  [place]  [facility]  01/25/2022  [phone] or [phone]

## 2022-01-25 NOTE — Unmapped (Signed)
Pharmacy Consult Service    Michelle Ponce is a 71 y.o. female receiving vancomycin for PNA.  Vancomycin therapy has been discontinued therefore pharmacy will sign off and will follow peripherally.  Please re-consult if therapy is resumed.    Hollyann Pablo, Pharm.D.    01/25/2022 10:41 AM  Phone: [phone]    For clinical pharmacy assistance on evenings and weekends, please call [phone].

## 2022-01-26 LAB — CBC
Hematocrit: 27.9 % — ABNORMAL LOW (ref 35.0–45.0)
Hemoglobin: 9.6 g/dL — ABNORMAL LOW (ref 11.7–15.5)
MCH: 35.5 pg — ABNORMAL HIGH (ref 27.0–33.0)
MCHC: 34.4 g/dL (ref 32.0–36.0)
MCV: 103.3 fL — ABNORMAL HIGH (ref 80.0–100.0)
MPV: 5.9 fL — ABNORMAL LOW (ref 7.5–11.5)
Platelets: 346 10*3/uL (ref 140–400)
RBC: 2.7 10*6/uL — ABNORMAL LOW (ref 3.80–5.10)
RDW: 14 % (ref 11.0–15.0)
WBC: 6.6 10*3/uL (ref 3.8–10.8)

## 2022-01-26 LAB — MAGNESIUM: Magnesium: 1.9 mg/dL (ref 1.5–2.5)

## 2022-01-26 LAB — BASIC METABOLIC PANEL
Anion Gap: 7 mmol/L (ref 3–16)
BUN: 13 mg/dL (ref 7–25)
CO2: 29 mmol/L (ref 21–33)
Calcium: 9 mg/dL (ref 8.6–10.3)
Chloride: 99 mmol/L (ref 98–110)
Creatinine: 0.25 mg/dL — ABNORMAL LOW (ref 0.60–1.30)
EGFR: >90
Glucose: 100 mg/dL (ref 70–100)
Osmolality, Calculated: 280 mosm/kg (ref 278–305)
Potassium: 3.8 mmol/L (ref 3.5–5.3)
Sodium: 135 mmol/L (ref 133–146)

## 2022-01-26 LAB — POC GLU MONITORING DEVICE
POC Glucose Monitoring Device: 125 mg/dL (ref 70–100)
POC Glucose Monitoring Device: 117 mg/dL (ref 70–100)
POC Glucose Monitoring Device: 131 mg/dL (ref 70–100)

## 2022-01-26 MED FILL — FAMOTIDINE 20 MG TABLET: 20 20 MG | ORAL | Qty: 1

## 2022-01-26 MED FILL — ENOXAPARIN 30 MG/0.3 ML SUBCUTANEOUS SYRINGE: 30 30 mg/0.3 mL | SUBCUTANEOUS | Qty: 0.3

## 2022-01-26 MED FILL — PIPERACILLIN-TAZOBACTAM 3.375 GRAM INTRAVENOUS SOLUTION: 3.375 3.375 gram | INTRAVENOUS | Qty: 1

## 2022-01-26 MED FILL — ATORVASTATIN 10 MG TABLET: 10 10 MG | ORAL | Qty: 1

## 2022-01-26 NOTE — Unmapped (Signed)
ECG performed on patient Michelle Ponce in 222/M222.  Results noted and given to patient's RN.  Test transmitted and paper copy placed in patient's chart.

## 2022-01-26 NOTE — Unmapped (Signed)
Occupational Therapy  Initial Assessment and Tentative Discharge     Name: Michelle Ponce  DOB: 31-Aug-1951  Attending Physician: Kirtland Bouchard, MD  Admission Diagnosis: Complicated UTI (urinary tract infection) [N39.0]  Pneumonia of both lungs due to infectious organism, unspecified part of lung [J18.9]  Sepsis with encephalopathy without septic shock, due to unspecified organism (CMS-HCC) [A41.9, R65.20, G93.40]  Date: 01/26/2022  Reviewed Pertinent hospital course: Yes  Hospital Course PT/OT: Pt is a 71 y/o female presenting with SOB and lethargy, dx: sepsis, PNA, UTI. Pt with recent hip fx s/p repair on 3/14 at outside hospital. [facility]) Pt sustained a mechanical fall, on 12/14/2021 and she was found to have an intertrochanteric fracture of the left hip and s/p left hip hemiarthroplasty,S/p PEG placement 3/16 at [facility])  Relevant PMH : hip fx s/p repair, legally blind, dysphagia w/ g-tube placement  Precautions: FWB LLE per care-everwhere (hemi-arthroplasty listed, but assuming posterior approach); Fall precautions; (PEG tube)  Activity Level: Activity as tolerated    Assessment  OT 6 Clicks  Help From Another Person To Put On/Take Off Regular Lower Body Clothing: Total  Help From Another Person Bathing ( including washing ,rinsing,drying): Total  Help From Another Person Toileting, which includes using the toilet, bedpan or urinal: Total  Help From Another Person To Put On/Take Off Regular Upper Body Clothing: Total  Help From Another Person Taking Care Of Personal Grooming: A lot  Help From Another Person Eating Meals: Total  OT 6 Clicks Score: 7  Assessment: Decreased ADL status, Decreased UE strength, Decreased UE ROM, Decreased Safe judgment during ADL, Decreased cognition, Decreased activity tolerance, Decreased self-care transfers, Decreased IADLs, Decreased Balance, Decreased Functional Mobility        Co-treatment performed: secondary to anticipated level of skilled assistance  required to safely treat and mobilize patient       Prognosis for OT goals: Guarded                        Goals  Goals to be met by: 02/02/22  Patient stated goal: patient unable to state  Patient will complete supine to sit in prep for ADLs: Maximum assistance (of 2)  Patient will complete grooming task: Stand-by assistance  Patient will complete upper body dressing: Moderate assistance  Patient will complete upper body bathing : Moderate assistance  Pt Will participate in upper extremity HEP to prep for ADLs: .  Collaborated with: Patient    Recommendation  Plan  Treatment Interventions: ADL retraining, Functional transfer training, UE strengthening/ROM, Activity Tolerance training, Therapeutic Activity, Excercise, Compensatory technique education  OT Frequency: minimum 2x/week  Recommendation  Recommendation: Short-term skilled OT  Equipment Recommendations: Defer at this time      Problem List  Patient Active Problem List   Diagnosis   ??? Disorientation   ??? Aspiration into respiratory tract   ??? Moderate protein-calorie malnutrition (CMS-HCC)        Past Medical History  Past Medical History:   Diagnosis Date   ??? Hypercholesteremia    ??? Hypertension    ??? Stroke (CMS-HCC)         Past Surgical History  History reviewed. No pertinent surgical history.    Home Living/Prior Function  Patient able to provide accurate information at this time: Yes, but patient is/may be a poor historian and accuracy should be confirmed  Comments: Patient has been at [facility] since left hemi-arthroplasty on 12/15/21  Lives With: Spouse  Assistance  available: 24 hour supervision  Type of Home: House  Home Entry: 1 step to enter  Home Layout: One level  Bathroom Shower/Tub:  (Patient was sponge bathing)  Bathroom Toilet: Standard  Home Equipment: Agricultural consultant  Additional Comments: Patient was sponge bathing at home prior to fall on 12/14/21  Prior Function  Functional Mobility: Modified Independent;1 person assist  Uses Assistive  Device: Rolling walker  ADL Assistance: Intermittent assistance needed  Needs assistance with: Bathing;Dressing;Grooming  IADL Assistance: Needs assistance  Needs assistance with: Driving;Laundry;Meal preparation  Vocation: Retired  Therapy services currently receiving: Therapy at an inpatient facility     Pain  Pain Score: 6   Pain Location: Generalized  Pain Descriptors: Aching  Pain Intervention(s): Repositioned;Ambulation/increased activity     Vision  Overall Vision/ Perception: Impaired  Impairments: Basic Vision  Current Vision: Legally blind (Legally blind in left eye)    Cognition  Overall Cognitive Status: Impaired  Cognitive Assessment: Arousal/ Alertness;Following Commands;Safety Judgment;Orientation Level  Arousal/Alertness: Lethargic;Delayed response to stimuli;Responds to verbal cues  Orientation Level: Oriented X4;with increased time  Following Commands: Follows one step commands;Requires demonstration;Requires repetition of instruction;Requires increased time;Requires verbal cues;Requires tactile cues  Safety Judgment: Impaired judgment;Decreased awareness of safety precautions    Neuromuscular     Overall Sensation: Patient denies any numbness/ tingling in BUE's/ BLEs          Right Upper Extremity   Right UE ROM: Grossly WFL as observed during functional activities  Right UE Strength: Impaired  Right UE Strength Comment: generalized weakness/deconditioning - MMT 4-/5  Right UE Muscle Tone: Normal  Right Hand Function: Grossly WFL as observed during functional activity         Left Upper Extremity  Left UE ROM: Impaired  Left UE ROM Comment: No passive or active L shoulder flexion - appears fixed in place vs pt resisting movement - distally WFL  Left UE Strength: Impaired  Left UE Strength Comment: generalized weakness/deconditioning, 4-/5 through available ROM  Left UE Muscle Tone: Normal  Left UE Hand Function: Grossly WFL as observed during functional activites         Functional Mobility  Bed  Mobility   Rolling: Total assistance;of 2 people;towards the right;towards the left  Total assist of 2 to boost up in bed and reposition - pt with neck in R flexed position, total assist to facilitate more neutral positioning. Pt tolerating chair position at end of session.    ADL  Grooming: Moderate assistance (wash face, wash hands)  Grooming Deficit: Verbal cueing;Supervision/safety;Set-up;Hand over hand assistance  Location Assessed Grooming: Seated in bed  Toileting: Total assistance (foley)         Position after Treatment and Safety Handoff  Position after therapy session: Bed in chair position  Details: RN notified;Call light/ needs within reach  Alarms: Bed  Alarms Status: Activated and Interfaced with call system    Patient Education  Role of OT, plan of care.    Tentative Discharge Summary:  Patient may discharge prior to next OT session.  0 out of 5 goals met.  Goals not met due to brevity of admit.  Recommend short term skilled OT.  Tentative d/c from Acute OT.     Time  Start Time: 0847  Stop Time: 0911  Time Calculation (min): 24 min    Charges  $OT Evaluation Mod Complex 45 Min: 1 Procedure           CPT codes  09811- Moderate Complexity Evaluation  This evaluation  code was determined based on analysis of pt's occupational profile, performance deficits, and level of clinical decision-making to complete OT evaluation      **Occupational Profile:  Mod Complexity - expanded chart review      **Performance deficits:   High Complexity- More than 5 performance deficits      **Clinical Decision-making:   Mod Complexity    Co-morbidities affecting pt's current performance: See above for PMH    Current level of physical/ verbal assistance:   Max physical/verbal assistance        Jens Som MS, OTR  Occupational Therapist  [facility] [facility]  01/26/2022

## 2022-01-26 NOTE — Unmapped (Signed)
Subjective:  Patient is on nasal cannula 3 L/min, tube feed has been restarted, tolerating well, started on pur??ed diet however oral intake is poor, patient denies any specific complaint at this time.      Continuous Infusions:    Objective:    Vital signs in last 24 hours:  Temp:  [98 ??F (36.7 ??C)-99.4 ??F (37.4 ??C)] 98.6 ??F (37 ??C)  Heart Rate:  [87-116] 87  Resp:  [19-25] 19  BP: (133-160)/(82-107) 133/87    Intake/Output last 3 shifts:  I/O last 3 completed shifts:  In: 1332 [P.O.:120; I.V.:250; NG/GT:462; IV Piggyback:500]  Out: 2725 [Urine:2725]  Intake/Output this shift:  I/O this shift:  In: 230 [P.O.:50; NG/GT:180]  Out: 265 [Urine:265]  Last BMP Result:      Lab 01/26/22  0357   SODIUM 135   POTASSIUM 3.8   CHLORIDE 99   CO2 29   BUN 13   CREATININE 0.25*   CALCIUM 9.0     Last CBC Result:      Lab 01/26/22  0357   WBC 6.6   HEMATOCRIT 27.9*   PLATELETS 346   RBC 2.70*     Recent Labs     01/24/22  1653   PROTIME 13.0   INR 0.9       Physical Exam:    Vitals:    01/26/22 0800 01/26/22 0938 01/26/22 1000 01/26/22 1200   BP: 145/88  (!) 155/96 133/87   BP Location: Right arm      Patient Position: Lying      Pulse: 95  93 87   Resp:    19   Temp: 98.4 ??F (36.9 ??C)   98.6 ??F (37 ??C)   TempSrc:       SpO2: 97% 98% 97% 96%   Weight:       Height:         General: Chronically ill-appearing lady, NAD  L:CTA, no W/R/R, not labored  H:RRR, S1S2  ZOX:WRUE ND/NT, normal BS, PEG tube in place.   Ext: no edema    Assessment/Plan:  ??  ??  RML/RLL pneumonia, likely aspiration: Continue Zosyn, vancomycin is d/c'd, NGTD on blood cultures, MRSA screen is negative, completed MBS and cleared for puree diet   ??  Hypoxemia: Secondary to above currently resolved and the patient is currently on NC 3 L/min, wean O2 as tolerted  ??  Pseudomonas UTI: Continue Zosyn as above, might switch abx to Levquin at discharge.    ??  Hx of CVA with residual left-sided weakness: Continue statin,?  Not on aspirin.  ??  Dysphagia s/p PEG tube: Tube  feed is resume, completed MBS and started on puree diet.  ??  Severe protein calorie malnutrition: Further management as above  ??  Anemia of chronic disease: Stable hemoglobin, monitor for now.  Hyperlipidemia: Continue statin.  ??  Essential hypertension: Continue to monitor off antihypertensive.  ??  GERD: Continue PPI  ??  DVT prophylaxis SQ Lovenox.    Dispo: likely ECF tomorrow.     ??      Michelle Ponce  01/26/2022

## 2022-01-26 NOTE — Unmapped (Signed)
Pulmonary Medicine Progress Note  Encounter date:  01/26/2022  Name:  Michelle Ponce Date/Time of Admission: 01/24/2022  4:20 PM    CSN: [id number] Attending Provider: Kirtland Bouchard, MD   Room/Bed: 222/M222 DOB: 08-19-1951 Age: 71 y.o.     Clinical history   Michelle Ponce is a 71 y.o. female with a history of HTN, HLD, ICH, dysphagia/malnutrition with G-tube placed 3/14 during admission for hip fx after a fall due to severe malnutrition and poor PO intake , recent hip fx after fall s/p repair 12/15/21 at OSH, Legally blind who presented to the ER from nursing facility with SOB, lethargy and tachycardia. Pt recently admitted at [facility] 4/2-5/23 for abd pain and urinary retention. Infectious work up negative, abx discontinued. Seen by SLP advised NPO with tube feedings, evaluated by nutritional services due to malnutrition. CXR showing R sided airspace disease concerning for possible aspiration pneumonia, improved L sided airspace disease compared to previous    Reason for Consult:   Pneumonia   SUBJECTIVE   Pt drowsy but answering questions appropriately. Denies any sob, some cough with minimal sputum production. Not eating much at all per nursing.       ASSESSMENT   1. Acute hypoxemia  2. Pneumonia likely aspiration  3. Pseudomonas UTI  4. Dysphagia s/p G tube  5. Febrile illness  6. Hx ICH  7. HTN  8. HLD  9. Legally Blind  10. Failure to thrive      PLAN   1. ABX: zosyn D3, will follow urine cx sensitivities and possibly change to levaquin  2. Aggressive airway clearance  3. Follow cultures  4. Wean O2 as tolerated  5. SLP eval appreciated cont current dietary recommendation and aspiration precautions  6. TF recommendations per nutrition  7. Consider re-consulting Palliative care for goals of care given another admission  8. PT/OT    Interpretation of Data after review:  99% on 3Lnc, Tmax 99.4, HR stable, BP elevated, wt not recorded, I/O not accurate, renal function stable H/H down, no new imaging, Ucx + pseudomonas,  MBSS no evidence of aspiration        The HPI, ROS, physical exam, test results, and assessment & plan were reviewed and copied forward (with edits) from a note written by myself on 01/25/22. I have reviewed and updated the history, physical exam, data, assessment, and plan of the note so that it reflects my evaluation and management of the patient.    Electronically signed: Runell Gess Tyriq Moragne, CNP4/25/2023 9:12 AM       Other Exam and Data Findings:  OBJECTIVE   VITAL SIGNS:  Vital Sign Range (last 24 hours)  Temp:  [98 ??F (36.7 ??C)-99.4 ??F (37.4 ??C)] 98 ??F (36.7 ??C)  Heart Rate:  [97-116] 102  Resp:  [19-25] 25  BP: (134-160)/(82-107) 153/98   I/O last 3 completed shifts:  In: 1332 [P.O.:120; I.V.:250; NG/GT:462; IV Piggyback:500]  Out: 2725 [Urine:2725]  Wt Readings from Last 3 Encounters:   01/24/22 (!) 95 lb 0.3 oz (43.1 kg)   01/05/22 (!) 96 lb 1.9 oz (43.6 kg)   12/26/21 114 lb 14.4 oz (52.1 kg)       CURRENT MEDS:  Current Facility-Administered Medications   Medication Dose Route Frequency Provider Last Rate Last Admin   ??? acetaminophen (TYLENOL) tablet 650 mg  650 mg Oral Q4H PRN Newman Pies, MD   650 mg at 01/25/22 2131   ??? atorvastatin (LIPITOR) tablet 10 mg  10 mg Per G Tube Daily  0900 Newman Pies, MD   10 mg at 01/25/22 1144   ??? enoxaparin (LOVENOX) for prophylaxis syringe 30 mg/0.3 mL  30 mg Subcutaneous Daily 0900 Lotfi Mamlouk, MD   30 mg at 01/25/22 1144   ??? famotidine (PEPCID) tablet 20 mg  20 mg Oral BID Lotfi Mamlouk, MD   20 mg at 01/25/22 2002   ??? magnesium hydroxide (MILK OF MAGNESIA) 2,400 mg/10 mL oral suspension 10 mL  10 mL Oral Daily PRN Newman Pies, MD       ??? mirtazapine (REMERON) tablet 15 mg  15 mg Oral Nightly (2100) Newman Pies, MD   15 mg at 01/25/22 2002   ??? ondansetron (ZOFRAN) injection 4 mg  4 mg Intravenous Q8H PRN Lotfi Mamlouk, MD       ??? piperacillin-tazobactam (ZOSYN) 3.375 g in sodium chloride 0.9% 100 mL ADV IVPB  3.375 g Intravenous 3 times per day Newman Pies,  MD 25 mL/hr at 01/26/22 0415 3.375 g at 01/26/22 0415        FiO2:  3Lnc  PHYSICAL EXAM:   GEN: No respiratory distress, alert and oriented x3, very thin, chronically ill appearing  HEENT: Atraumatic, normocephalic, pupils equal reactive to light, extraocular movements intact, sclera anicteric, no maxillary or frontal sinus tenderness to palpation,  mucous membranes moist, no oral lesions. Mallampati grade 3.  NECK:  supple, no masses, no lymphadenopathy  CARDIOVASCULAR: Regular Rate and Rhythm, no murmurs gallops or rubs. Radial pulse 2+ and equal.   LUNGS: Diminished to ausculation bilaterally, no wheeze, no rhonchi, no rales, no egophony, no dullness to percussion, minimally congested weak cough  ABDOMEN: Soft, non-tender, nondistended, bowel sounds present  SKIN: Warm, very dry, peeling bilateral LE  EXTREMITIES: No clubbing, no pitting edema.  NEURO: CN 2-12 grossly intact. No gross focal deficits.    PSYCH: Normal affect and mood.    LABS:  Recent Results (from the past 24 hour(s))   ABO/Rh    Collection Time: 01/25/22  9:26 AM   Result Value Ref Range    ABO Grouping A     Rh Type Positive    POC Glucose Monitoring Device    Collection Time: 01/25/22 11:49 AM   Result Value Ref Range    POC Glucose Monitoring Device 83 70 - 100 mg/dL   CBC, AM    Collection Time: 01/26/22  3:57 AM   Result Value Ref Range    WBC 6.6 3.8 - 10.8 10E3/uL    RBC 2.70 (L) 3.80 - 5.10 10E6/uL    Hemoglobin 9.6 (L) 11.7 - 15.5 g/dL    Hematocrit 16.1 (L) 35.0 - 45.0 %    MCV 103.3 (H) 80.0 - 100.0 fL    MCH 35.5 (H) 27.0 - 33.0 pg    MCHC 34.4 32.0 - 36.0 g/dL    RDW 09.6 04.5 - 40.9 %    Platelets 346 140 - 400 10E3/uL    MPV 5.9 (L) 7.5 - 11.5 fL   Basic Metabolic panel, AM    Collection Time: 01/26/22  3:57 AM   Result Value Ref Range    Sodium 135 133 - 146 mmol/L    Potassium 3.8 3.5 - 5.3 mmol/L    Chloride 99 98 - 110 mmol/L    CO2 29 21 - 33 mmol/L    Anion Gap 7 3 - 16 mmol/L    BUN 13 7 - 25 mg/dL    Creatinine 8.11 (L)  0.60 - 1.30 mg/dL  Glucose 100 70 - 100 mg/dL    Calcium 9.0 8.6 - 16.1 mg/dL    Osmolality, Calculated 280 278 - 305 mOsm/kg    EGFR >90    Magnesium    Collection Time: 01/26/22  4:46 AM   Result Value Ref Range    Magnesium 1.9 1.5 - 2.5 mg/dL       Microbiology  0/96/04 MRSA nares negative  01/24/22 Blood Cx x2 NGTD  01/24/22 Ucx Pseudomonas    Imaging  Echo 2D Complete (TTE)    Result Date: 01/05/2022                            Kursten Chalk                      Department of Cardiology*                        [address]                       [place], [place] [address]                            [phone] Transthoracic Echocardiography Patient:    Lucendia, Leard MR #:       [id number] Account: Study Date: 01/05/2022 Gender:     F Age:        50 DOB:        Nov 07, 1950 Room:       [facility]  REFERRING    Thedore Mins,  PERFORMING   [facility], [facility]  SONOGRAPHER  Cheryll Cockayne, Manoj K  Ollen Bowl, Brad A  ATTENDING    Thedore Mins, Manoj K  Floy Sabina, Brad A ------------------------------------------------------------------- Procedure:ECHO 2D COMPLETE (TTE)              Order: Accession Number:[id number] ------------------------------------------------------------------- Indications:      (I42.9 Cardiomyopathy- unspecified). ------------------------------------------------------------------- PMH:   Cerebrovascular accident.  Risk factors:  Hypertension.  ------------------------------------------------------------------- Study data:  Height: 62in. 157.5cm. Weight: 95.8lb. 43.5kg.  Study status:  Routine.  Procedure:  Transthoracic echocardiography. Image quality was good. Scanning was performed from the parasternal, apical, and subcostal acoustic windows. Intravenous contrast (Optison) was administered.          Transthoracic echocardiography.  M-mode, complete 2D, complete spectral Doppler, and color Doppler.  Birthdate:  Patient birthdate: May 03, 1951.  Age:  Patient is 71yr old.  Sex:  Birth gender: female.  Body mass index:  BMI: 17.6kg/m^2.  Body surface area:    BSA: 1.1m^2.  Blood pressure:     125/65  Patient status:  Inpatient.  Study date: Study date: 01/05/2022. Study time: 12:30 PM.  Location:  Echo laboratory. ------------------------------------------------------------------- Study Conclusions - Left ventricle: The cavity size is normal. Wall thickness was increased in a pattern of moderate   LVH. Systolic function was normal. The estimated ejection fraction was in the range of 55% to 60%.   Wall motion was normal; there were no regional wall motion abnormalities. Doppler parameters are   consistent with abnormal left ventricular relaxation (grade 1 diastolic dysfunction). - Aortic valve: Moderate regurgitation. - Mitral valve: Mildly calcified annulus. Mild thickening. Mild regurgitation. - Right ventricle: Systolic function was normal by objective interpretation. - Tricuspid  valve: Mild-moderate regurgitation. - Pulmonary arteries: Systolic pressure was mildly increased, estimated to be 35mm Hg assuming that   the right atrial pressure was 3 mmHg. - Pericardium, extracardiac: A trivial pericardial effusion is identified. There is a left pleural   effusion. ------------------------------------------------------------------- Cardiac Anatomy Left ventricle: - The cavity size is normal. Wall thickness was increased in a pattern of moderate LVH. Systolic   function was normal. The estimated ejection fraction was in the range of 55% to 60%. Wall motion   was normal; there were no regional wall motion abnormalities. - Wall motion score: 1.00. - Doppler parameters are consistent with abnormal left ventricular relaxation (grade 1 diastolic   dysfunction). Aorta:   Aortic root: - The aortic root is normal in size. Aortic valve: - Trileaflet; normal thickness leaflets. Mobility was not restricted. Doppler: - Transvalvular velocity is within the normal range.  There is no stenosis. Moderate regurgitation. Mitral valve: - Mildly calcified annulus. Mild thickening. Mobility was not restricted. Doppler: - Transvalvular velocity is within the normal range. There is no evidence for stenosis. Mild   regurgitation. Left atrium: - The atrium is normal in size. Pulmonary artery: - Systolic pressure was mildly increased, estimated to be 35mm Hg assuming that the right atrial   pressure was 3 mmHg. Systemic veins: Inferior vena cava: The vessel was normal in size. Right ventricle: - The cavity size is normal. Wall thickness is normal. Systolic function was normal by objective   interpretation. Pulmonic valve:    Doppler: - Transvalvular velocity is within the normal range. There is no evidence for stenosis. No   regurgitation. Tricuspid valve: - Structurally normal valve. Doppler: - Transvalvular velocity is within the normal range. Mild-moderate regurgitation. Right atrium: - The atrium is normal in size. Pericardium: - A trivial pericardial effusion is identified. There is a left pleural effusion. Systemic veins: Inferior vena cava: - The vessel was normal in size. ------------------------------------------------------------------- Measurements  Left ventricle                Value        Ref  EDD, LAX             (L)      3.4   cm     3.8 - 5.2  ESD, LAX             (N)      2.4   cm     2.2 - 3.5  EDD/bsa, LAX         (N)      2.4   cm/m^2 2.3 - 3.1  ESD/bsa, LAX         (N)      1.7   cm/m^2 1.3 - 2.1  FS, LAX              (N)      29    %      27 - 45  FS, LAX chord        (N)      29    %      27 - 45  ESD                  (N)      2.4   cm     2.2 - 3.5  ESD/bsa              (N)      1.7   cm/m^2 1.3 - 2.1  PW, ED               (H)      1.4   cm     0.6 - 0.9  IVS/PW, ED                    1            ----------  EDV                  (N)      47    ml     46 - 106  ESV                  (N)      20    ml     14 - 42  EF                   (N)      57    %      54 - 74  EDV/bsa               (N)      34    ml/m^2 29 - 61  ESV/bsa              (N)      14    ml/m^2 8 - 24  SV, 1-p A2C                   27    ml     ----------  SV/bsa, 1-p A2C               19.4  ml/m^2 ----------  SV, 1-p A4C                   26    ml     ----------  SV/bsa, 1-p A4C               18    ml/m^2 ----------  E', lat ann, TDI     (L)      6.9   cm/sec >=10.0  E/e', lat ann, TDI            11           ----------  E', med ann, TDI     (L)      5.0   cm/sec >=7.0  E/e', med ann, TDI            14           ----------  E', avg, TDI                  5.9   cm/sec ----------  E/e', avg, TDI       (N)      12           <=14   Ventricular septum            Value        Ref  IVS, ED              (H)      1.4   cm     0.6 - 0.9   Right ventricle               Value        Ref  TAPSE, MM            (N)  2.1   cm     1.7 - 3.1  S' lateral           (H)      16.2  cm/sec 6.0 - 13.4   Left atrium                   Value        Ref  Area ES, A4C         (N)      13    cm^2   <=20  Area ES, A2C                  15    cm^2   ----------  SI dim, A2C                   5.1   cm     ----------  Vol, ES, 1-p A4C     (N)      29    ml     22 - 52  Vol/bsa, ES, 1-p A4C (N)      21    ml/m^2 11 - 40  Vol, ES, 1-p A2C     (N)      37    ml     22 - 52  Vol/bsa, ES, 1-p A2C (N)      26    ml/m^2 13 - 40  Vol, ES, 2-p                  34    ml     ----------  Vol/bsa, ES, 2-p     (N)      25    ml/m^2 16 - 34   Right atrium                  Value        Ref  Area, ES, A4C        (L)      9     cm^2   10 - 18   Aortic valve                  Value        Ref  Peak v, S                     1.4   m/sec  ----------  AR peak v                     5.34  m/sec  ----------  AR PHT                        344   ms     ----------  AR peak grad                  114   mm Hg  ----------   Mitral valve                  Value        Ref  Peak E                        0.72  m/sec  ----------  Peak A                        0.94  m/sec  ----------  Decel  time                    99    ms     ----------  Peak grad, D                  2     mm Hg  ----------  Peak E/A ratio                0.8          ----------   Pulmonic valve                Value        Ref  Peak v, S                     0.8   m/sec  ----------   Tricuspid valve               Value        Ref  TR peak v            (N)      2.8   m/sec  <=2.8  Peak RV-RA grad, S            31    mm Hg  ----------  Max TR vel                    2.05  m/sec  ----------  Legend: (L)  and  (H)  mark values outside specified reference range. (N)  marks values inside specified reference range. Reviewed and confirmed by Lionel December 2023-04-04T16:28:49    CT Abdomen and Pelvis WO IV contrast    Result Date: 01/03/2022  EXAM: CT CHEST WO CONTRAST EXAM: CT ABDOMEN AND PELVIS WO CONTRAST INDICATION: Cough, chronic/persisting > 8 weeks, failed empiric treatment,UTI, recurrent/complicated (Female) COMPARISON: Chest radiograph from the same day and CT abdomen pelvis from 12/26/2021. TECHNIQUE: Thin section CT images were obtained from the lung apices through the pelvis without contrast. Coronal and sagittal MPR was performed at the scanner. FIELD OF VIEW: 38 cm. FINDINGS: CHEST: Lower Neck: Unremarkable. Heart: The heart is not enlarged. Moderate to severe coronary artery calcifications. Mitral annular calcifications. Vascular Structures: Unremarkable. Mediastinum And Hila: No lymphadenopathy. Central airways: Central secretions most significant in the right mainstem bronchus with scattered subsegmental mucous plugging Pleura: Small right and moderate left pleural effusions. Lungs: Left greater than right passive atelectasis. Patchy peripheral predominant on the right and posterior predominant on the left patchy consolidations with scattered bandlike opacities. Right basilar predominant smooth septal thickening. Chest Wall: Anasarca. Osseous Structures: No acute osseous abnormalities or suspicious osseous lesions. Subacute sixth  through eighth anterior right rib fractures. ABDOMEN/PELVIS: Liver: Noncirrhotic in morphology.  No focal liver lesions within the limitations of a noncontrast exam. Biliary Tree: No intra or extra-hepatic biliary ductal dilatation. Spleen: Normal Pancreas: Normal Adrenal Glands: Unchanged right adrenal nodule. Kidneys/Ureters/Bladder: No focal renal lesions.  No hydronephrosis. The urinary bladder is not well visualized due to streak artifact from left hip arthroplasty. Gastrointestinal Tract: Gastrostomy tube terminates at the body/antrum junction. Gastric, small bowel, colonic caliber and wall thickness are within normal limits. The appendix is not definitively visualized. Lymphatics: No abdominal or pelvic lymph nodes are enlarged by size criteria. Vasculature: Scattered atherosclerotic plaque without aneurysmal dilatation. Peritoneum/Retroperitoneum: Small volume ascites. No free air or loculated fluid collections. Abdominal Wall/Soft Tissues: Unremarkable. Genital Structures: The lower pelvis is not  well visualized due to streak artifact from left hip arthroplasty. Osseous Structures: No acute osseous abnormalities or suspicious osseous lesions. Levocurvature of the lumbar spine, which may be exaggerated due to positioning. Postsurgical changes associated with left hip arthroplasty. Partially visualized hardware appears intact without periprosthetic lucency. Moderate right hip right cirrhosis. Subacute or chronic fracture of the left obturator ring.     IMPRESSION: CHEST 1.  Central secretions most significant in the right mainstem bronchus with scattered subsegmental mucous plugging, which may related to aspiration. 2.  Small right and moderate left pleural effusions with associated passive atelectasis. 3.  Bilateral patchy consolidations, which may related to multifocal pneumonia or aspiration. 4.  Right basilar predominant smooth septal thickening, which may related to pulmonary edema. ABDOMEN/PELVIS 1.   Small volume ascites, otherwise no acute abnormality of the abdomen and pelvis identified within the limits of a noncontrast exam. Approved by Carey Bullocks, MD on 01/03/2022 9:46 PM EDT I have personally reviewed the images and I agree with this report. Report Verified by: Zane Herald, MD at 01/03/2022 9:57 PM EDT    CT Chest WO contrast    Result Date: 01/03/2022  EXAM: CT CHEST WO CONTRAST EXAM: CT ABDOMEN AND PELVIS WO CONTRAST INDICATION: Cough, chronic/persisting > 8 weeks, failed empiric treatment,UTI, recurrent/complicated (Female) COMPARISON: Chest radiograph from the same day and CT abdomen pelvis from 12/26/2021. TECHNIQUE: Thin section CT images were obtained from the lung apices through the pelvis without contrast. Coronal and sagittal MPR was performed at the scanner. FIELD OF VIEW: 38 cm. FINDINGS: CHEST: Lower Neck: Unremarkable. Heart: The heart is not enlarged. Moderate to severe coronary artery calcifications. Mitral annular calcifications. Vascular Structures: Unremarkable. Mediastinum And Hila: No lymphadenopathy. Central airways: Central secretions most significant in the right mainstem bronchus with scattered subsegmental mucous plugging Pleura: Small right and moderate left pleural effusions. Lungs: Left greater than right passive atelectasis. Patchy peripheral predominant on the right and posterior predominant on the left patchy consolidations with scattered bandlike opacities. Right basilar predominant smooth septal thickening. Chest Wall: Anasarca. Osseous Structures: No acute osseous abnormalities or suspicious osseous lesions. Subacute sixth through eighth anterior right rib fractures. ABDOMEN/PELVIS: Liver: Noncirrhotic in morphology.  No focal liver lesions within the limitations of a noncontrast exam. Biliary Tree: No intra or extra-hepatic biliary ductal dilatation. Spleen: Normal Pancreas: Normal Adrenal Glands: Unchanged right adrenal nodule. Kidneys/Ureters/Bladder: No focal renal  lesions.  No hydronephrosis. The urinary bladder is not well visualized due to streak artifact from left hip arthroplasty. Gastrointestinal Tract: Gastrostomy tube terminates at the body/antrum junction. Gastric, small bowel, colonic caliber and wall thickness are within normal limits. The appendix is not definitively visualized. Lymphatics: No abdominal or pelvic lymph nodes are enlarged by size criteria. Vasculature: Scattered atherosclerotic plaque without aneurysmal dilatation. Peritoneum/Retroperitoneum: Small volume ascites. No free air or loculated fluid collections. Abdominal Wall/Soft Tissues: Unremarkable. Genital Structures: The lower pelvis is not well visualized due to streak artifact from left hip arthroplasty. Osseous Structures: No acute osseous abnormalities or suspicious osseous lesions. Levocurvature of the lumbar spine, which may be exaggerated due to positioning. Postsurgical changes associated with left hip arthroplasty. Partially visualized hardware appears intact without periprosthetic lucency. Moderate right hip right cirrhosis. Subacute or chronic fracture of the left obturator ring.     IMPRESSION: CHEST 1.  Central secretions most significant in the right mainstem bronchus with scattered subsegmental mucous plugging, which may related to aspiration. 2.  Small right and moderate left pleural effusions with associated passive atelectasis. 3.  Bilateral patchy consolidations, which may related to multifocal pneumonia or aspiration. 4.  Right basilar predominant smooth septal thickening, which may related to pulmonary edema. ABDOMEN/PELVIS 1.  Small volume ascites, otherwise no acute abnormality of the abdomen and pelvis identified within the limits of a noncontrast exam. Approved by Carey Bullocks, MD on 01/03/2022 9:46 PM EDT I have personally reviewed the images and I agree with this report. Report Verified by: Zane Herald, MD at 01/03/2022 9:57 PM EDT    MRI Head W WO contrast    Result Date:  01/05/2022  EXAM: MRI BRAIN W WO CONTRAST INDICATION: Mental status change, unknown cause TECHNIQUE:  MRI brain performed including multiplanar T1 and T2 weighted sequences. DWI and SWAN imaging was also performed. Imaging was obtained prior to and after 5 mL of GADOBUTROL 1 MMOL/ML INTRAVENOUS SOLUTION [facility]) administered intravenously. COMPARISON: None available. FINDINGS: Mild ventricular enlargement, right greater than left, related to prior infarction hemorrhage. Moderate prominence basal cisterns, fissures, sulci. Multiple foci of susceptibility basal ganglia, cerebellum, with hemosiderin lined remote hematoma cavity of the right putamen and corona radiata. Multiple foci with periventricular confluence T2/FLAIR hyperintensity. Lacunar changes left putamen. Small focus diffusion restriction splenium corpus callosum. Orbits, paranasal sinuses, and mastoid regions: No acute orbital abnormality. Clear paranasal sinuses. Aerated mastoid air cells. Left globe banding procedure. Vascular structures: Normal arterial and venous flow voids. Other: Normal included extracranial structures. Upper cervical spine without significant abnormality.     IMPRESSION: 1. Foci and confluent areas T2/FLAIR hyperintensity with lacunar change, and multifocal susceptibility, consistent with chronic small vessel disease. 2. Remote right putamen/corona radiata hematoma cavity 3. Small focus diffusion restriction is splenium corpus callosum. Report Verified by: Burt Ek, MD at 01/05/2022 8:35 AM EDT    CT Abdomen and Pelvis With IV contrast    Result Date: 01/04/2022  EXAM: CT ABDOMEN AND PELVIS WITH IV CONTRAST INDICATION: LLQ abdominal pain, Abdominal pain, acute, nonlocalized TECHNIQUE: CT of the abdomen and pelvis was performed after the administration of intravenous contrast. Axial images were obtained with coronal and sagittal reconstructions. CONTRAST: 125 mL of IOHEXOL 350 MG IODINE/ML INTRAVENOUS SOLUTION administered intravenously  FIELD OF VIEW: 38 cm COMPARISON: 01/03/2022 FINDINGS: Lower Chest: Moderate left and small right pleural effusions with adjacent atelectasis. Liver: Normal liver contour. No focal hepatic lesions. Biliary Tree: No intra or extra-hepatic biliary ductal dilatation. The gallbladder is unremarkable. Spleen: Unremarkable Pancreas: Unremarkable Adrenal Glands: 1.3 cm right adrenal nodule demonstrates unenhanced characteristics most compatible with a benign lipid rich adrenal adenoma on the recent noncontrast chest CT. The left adrenal gland is normal. Kidneys/Ureters/Bladder: No dural multifocal renal cortical scarring. No suspicious renal lesions. No hydronephrosis. Urinary bladder is collapsed around a Foley catheter Gastrointestinal Tract: Percutaneous gastrostomy tube is in expected position. Large colonic stool burden. Bowel caliber and wall thickness are otherwise within normal limits. The appendix is normal. Lymphatics: No abdominal or pelvic lymph nodes are enlarged by size criteria. Vasculature: Moderate atherosclerotic plaque without aneurysmal dilatation of the abdominal aorta. 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 Incidentally noted left drain the left renal vein. Peritoneum/Retroperitoneum: Small volume ascites. No rim-enhancing fluid collections. No free air. Abdominal Wall/Soft Tissues: Severe diffuse body wall edema. Genital Structures: Uterus is present. No adnexal masses. Osseous Structures: No acute osseous abnormalities or suspicious osseous lesions. Convex left thoracolumbar scoliosis. Left hip arthroplasty. Moderate to severe degenerative changes in the right hip. Nonacute left obturator ring fracture deformity.     IMPRESSION:  1.  Severe anasarca including a small volume of ascites with moderate left and small right pleural effusions 2.  Large stool burden 3.  Left greater than right pelvic varices. Recommend correlation with any signs or symptoms of  pelvic congestion syndrome. Report Verified by: Annett Fabian, MD at 01/04/2022 4:56 PM EDT    FL MODIFIED BARIUM SWALLOW, INC SCOUT/DELAYED IMAGES w SPEECH Therapy    Result Date: 01/25/2022  EXAM: FL MODIFIED BARIUM SWALLOW INC SCOUT/DELAYED IMAGES WITH SPEECH THERAPY Indication: r/o silent aspiration Comparison:None Technique: Video fluoroscopic swallowing examination was performed in conjunction with the speech pathology department. Various liquid and solid foods substances were used to assess swallowing including thin liquid, nectar, honey thickened and puree consistencies. Findings:  Normal oral transit was observed. Both regular and dense barium, as well as semisolid and solid food substances, were swallowed without difficulty. There was no evidence of tracheal aspiration. Total fluoroscopy time is 1.3 minutes     IMPRESSION: No evidence of tracheal aspiration. For more information and dietary recommendations, please refer to the corresponding report submitted by speech pathology. Report Verified by: Particia Nearing, MD at 01/25/2022 2:55 PM EDT    X-ray Portable Chest    Result Date: 01/24/2022  EXAM: XR PORTABLE CHEST INDICATION: Fever, unspecified TECHNIQUE: 1 view of the chest. COMPARISON: Chest radiograph January 24, 2022 FINDINGS: Medical Devices: None. Heart and Mediastinum: Cardiac size is partially obscured, but likely stable. Lungs and Pleura: Left basilar pleural-parenchymal opacities are slightly improved. Biapical scarring. Slightly increased patchy opacity in the right mid and upper lung zone. Small right pleural effusion suspected. No evidence of pneumothorax. Bones and soft tissues: No acute abnormalities.     IMPRESSION: 1.  Slightly increased patchy opacities in the right mid and lower lung zone may relate to pneumonia or aspiration. 2.  Mild improvement of left basilar pleural-parenchymal opacities likely representing a left pleural effusion with associated parenchymal disease. Report Verified  by: Dagmar Hait, MD at 01/24/2022 5:14 PM EDT    X-ray Portable Chest    Result Date: 01/03/2022  EXAM: XR PORTABLE CHEST INDICATION: Chest pain, unspecified TECHNIQUE: 1 view of the chest. COMPARISON: 12/26/2021 FINDINGS: Medical Devices: None. Heart and Mediastinum: Cardiomediastinal silhouette is within normal limits. Lungs and Pleura: Patchy bilateral airspace disease greatest at the left lung base with small left pleural effusion. No pneumothorax.. No evidence for pneumothorax. Bones and soft tissues: No acute abnormalities.     IMPRESSION: Patchy airspace disease greatest at the left lung base which may reflect aspiration or pneumonia. Report Verified by: Zane Herald, MD at 01/03/2022 4:06 PM EDT      Other

## 2022-01-26 NOTE — Unmapped (Signed)
Speech Language Pathology  Dysphagia Progress Note  And Discharge         Name: Michelle Ponce  DOB: 09/19/1951  Attending Physician: Kirtland Bouchard, MD  Admission Diagnosis: Complicated UTI (urinary tract infection) [N39.0]  Pneumonia of both lungs due to infectious organism, unspecified part of lung [J18.9]  Sepsis with encephalopathy without septic shock, due to unspecified organism (CMS-HCC) [A41.9, R65.20, G93.40]  Date: 01/26/2022  Precautions: aspiration; fall; allergy  Reviewed Pertinent hospital course: Yes  Hospital Course SLP: Per H&P 01/25/22: Pt is 71 y.o. Female with past medical history of CVA with residual left-sided weakness, dysphagia status post PEG tube, hyperlipidemia, essential hypertension, legally blind who presented to ED from the [facility] with a complaint of increasing shortness of breath and hypoxia.  Patient was found to be high short of breath at the facility with increasing lethargy.  She was also reported to have a fever and generalized weakness that started 2 days ago.  Patient was reportedly requiring supplemental oxygen up to 10 L/min prior to arrival to ED.  In ED patient was found to be hypoxic requiring nasal cannula 4 L/min.  Her chest x-ray shows right middle and right lower lobe infiltrates.  Patient was also found to be febrile in ER with temperature of 102.4 ??F.  Patient was admitted for further management of pneumonia.    Imaging: Chest xray 01/24/22: 1.  Slightly increased patchy opacities in the right mid and lower lung zone may relate to pneumonia or aspiration.   2.  Mild improvement of left basilar pleural-parenchymal opacities likely representing a left pleural effusion with associated parenchymal disease.     SLP Hx: Patient most recently seen by SLP 01/04/22-01/05/22.  She was on considered to be at risk for aspiration, had concerns for aspiration with emesis, and was peg tube dependent.  Patient had use of peg tube for nutrition and a puree diet at her facility.  She  currently endorses difficulty swallowing at night, and usually has an oral breakfast.  Patient stated that she is on a puree diet due to difficulty chewing solids.  Assessment complete?: Yes    MBSS 01/25/22: Patient presents with mild oropharyngeal dysphagia secondary to perioral weakness, delayed swallow initiation, and reduced laryngeal excursion, complicated by medical co-morbidities resulting in spillover of bolus before the swallow and mild vallecular, tongue base, and posterior pharyngeal wall residuals.  There was no laryngeal penetration nor aspiration on any given consistency, however patient is considered to be at an increased risk for aspiration secondary to overall weakness and medical co-morbidities.  Recommend a puree/dysphagia 1 diet (baseline) and thin liquids with use of strict aspiration precautions.  Of note, patient with significant right head tilt and required support of pillows for neutral head position.       ??       Assessment: MBSS completed 01/25/22.  Patient continues to present with increased risk of oropharyngeal dysphagia and risks of aspiration secondary to cognitive status, 01/25/22 MBSS results, and medical co-morbidities.  This date: Husband at bedside and contacted son via phone for SLP education.  They were provided with extensive education regarding MBSS results, aspiration risks, diet recommendations, and safe swallowing precautions.  Both son and husband verbalized understanding all education.  Patient asleep in bed, and RN reported she has been unable to accept intake this date due to level of alertness.  Patient has peg tube for nutrition.  Following education on proper positioning/upright with tube feeds, husband reported that a few day prior  to admission patient was found to be flat in bed at facility with tube feeds running.  Recommend continue peg tube for nutrition with use of a puree diet and thin liquids with STRICT ASPIRATION PRECAUTIONS for supplemental pleasure  feeds.  No further skilled SLP intervention warranted at this time.      Plan/Recommendation:   1. Diet: Dysphagia 1 (puree) with Thin liquids with peg tube as primary source of nutrition  2. Safe Swallowing Strategies: Upright, Small bites/sips, Slow rate, Remain upright 30 minutes after meals and Supervision with meals/full assist with meals  3. Discharge from SLP services- no acute SLP needs at this time  4. SLP at discharge is not recommended        Subjective:  Patient sitting partially upright in bed with multiple pillows for support.  She was asleep and briefly opened her eyes to name.  No indication of pain.  Husband at bedside and he contacted son via phone to participate in SLP education.     Objective:  Goals Short Term Dysphagia  Patient's goal is: to have water  ??  Patient and/or caregiver will demonstrate understanding of clinical signs and symptoms of aspiration at 80% accuracy: Current; Husband and son were educated on results of MBSS, risks of aspiration in relation to results and in relation to tube feeds, proper positioning for intake and positioning/not flat for tube feeds, diet recommendations, and safe swallowing precautions.  Both husband and son demonstrated good understanding of education.  Goal met 100%  ??  Patient and/or caregiver will demonstrate understanding of risks associated with aspiration at 100% accuracy: Current: See goal above. Goal met 100%  ??  Patient will participate in ongoing assessment at 100% to further evaluate swallowing to include MBSS:  Current: MBSS 01/25/22.  Goal met 100%  ??  Time frame for goals to be met in: at least 2x/week for two weeks (02/08/22)  ??  Goals Long Term Dysphagia  ??  Patient will tolerate the least restrictive diet as determined via instrumental assessment with 100% accuracy independently: Current: No intake provided as level of alertness not conducive to safe oral intake.  Husband reported that patient would not even drink the milkshake that is  usually her favorite of which he brought for her.  Recommend continue peg tube for nutrition with use of a puree diet and thin liquids with STRICT ASPIRATION PRECAUTIONS for supplemental pleasure feeds.  Goal not met as no intake due to level of alertness  ??  Time frame for goals to be met in: at least 2x/week for three weeks (02/15/22)  ??    Education:  Extensive education provided to husband and son.  See goal above for details.      Patient was left Semi-reclined in bed with call light within reach, husband present, and all needs met.  Safety handoff completed with RN/Jen who verbalized understanding.    Discharge Speech Therapy as skilled intervention no longer warranted.  Initial assessment 01/25/22.  MBSS has been completed in addition to extensive family education.  Goals and education as above.          Time  Start Time: 1436  Stop Time: 1454  Time Calculation (min): 18 min    Charges      $Swallow Tx : 1 Procedure     Soil scientist Pathologist  [place]  [facility]  01/26/2022  [phone] or [phone]

## 2022-01-26 NOTE — Unmapped (Addendum)
Physical Therapy  Initial Assessment/Overlap with OT  x 2 skilled therapists to facilitate safe + functional movements         Name: Michelle Ponce  DOB: 09-02-1951  Attending Physician: Kirtland Bouchard, MD  Admission Diagnosis: Complicated UTI (urinary tract infection) [N39.0]  Pneumonia of both lungs due to infectious organism, unspecified part of lung [J18.9]  Sepsis with encephalopathy without septic shock, due to unspecified organism (CMS-HCC) [A41.9, R65.20, G93.40]  Date: 01/26/2022  Reviewed Pertinent hospital course: Yes  Hospital Course PT/OT: Pt is a 71 y/o female presenting with SOB and lethargy, dx: sepsis, PNA, UTI. Pt with recent hip fx s/p repair on 3/14 at outside hospital. [facility]) Pt sustained a mechanical fall, on 12/14/2021 and she was found to have an intertrochanteric fracture of the left hip and s/p left hip hemiarthroplasty,S/p PEG placement 3/16 at [facility])  Relevant PMH : hip fx s/p repair, legally blind, dysphagia w/ g-tube placement  Precautions: FWB LLE per care-everwhere (hemi-arthroplasty listed, but assuming posterior approach); Fall precautions; (PEG tube); Oxygen Saturation >90%  Activity Level: Activity as tolerated  Assessment  PT 6 Clicks  Help From Another Person Turning From Back to Side While Flat in Bed Without Using Siderails: Total  Help From Another Person Moving From Lying On Back To Sitting Without Using Siderails: Total  Help From Another Person Moving To And From Bed To Chair: Total  Help From Another Person Standing Up From Chair Using Your Arms: Total  Help From Another Person To Walk In Hospital Room: Total  Help From Another Person Climbing 3-5 Steps With A Railing: Total  PT 6 Clicks Score: 6  Assessment: Impaired Bed Mobility, Impaired Transfers, Impaired Balance, Impaired Strength, Impaired ROM, Impaired Safety Awareness, Impaired Cognition, Deconditioning, Impaired Activity Tolerance     Co-treatment performed: secondary to anticipated  level of skilled assistance required to safely treat and mobilize patient  Prognosis: Fair    Goals   Collaborated with: Patient  Patient Stated Goal: to decrease pain during mobility  Goals to be met by: 02/02/22  Patient will transition from supine to sit: Maximum assistance  Patient will transition from sit to supine: Maximum assistance  Patient will sit edge of bed: Minimal assistance  Miscellaneous Goal #1: Patient will be able to participated with B LE HEP with moderate assistance for increased strength and AROM while maintaining assumed posterior hip precautions on L LE.  Miscellaneous Goal #2: Patient will be able to maintain eyes open 100% of session and follow one step commands.   Recommendation  Plan  Treatment/Interventions: LE strengthening/ROM, Endurance training, Patient/family training, Therapeutic Activity, Therapeutic Exercise, Neuromuscular Reeducation  PT Frequency: minimum 2x/week    Recommendation  Recommendation: Short-term skilled PT  Equipment Recommended: Defer to facility to obtain  Problem List  Patient Active Problem List   Diagnosis   ??? Disorientation   ??? Aspiration into respiratory tract   ??? Moderate protein-calorie malnutrition (CMS-HCC)      Past Medical History  Past Medical History:   Diagnosis Date   ??? Hypercholesteremia    ??? Hypertension    ??? Stroke (CMS-HCC)       Past Surgical History  History reviewed. No pertinent surgical history.  Home Living/Prior Function  Patient able to provide accurate information at this time: Yes, but patient is/may be a poor historian and accuracy should be confirmed  Comments: Patient has been at [facility] since left hemi-arthroplasty on 12/15/21, but prior to fall. Patient was ambulating with  RW at home with spouse (see below)  Lives With: Spouse  Assistance available: 24 hour supervision  Type of Home: House  Home Entry: 1 step to enter  Home Layout: One level  Bathroom Shower/Tub:  (Patient was sponge bathing)  Bathroom Toilet: Standard  Home  Equipment: Agricultural consultant  Additional Comments: Patient was sponge bathing at home prior to fall on 12/14/21  Prior Function  Functional Mobility: Modified Independent;1 person assist  Uses Assistive Device: Rolling walker  ADL Assistance: Intermittent assistance needed  Needs assistance with: Bathing;Dressing;Grooming  IADL Assistance: Needs assistance  Needs assistance with: Driving;Laundry;Meal preparation  Vocation: Retired  Therapy services currently receiving: Therapy at an inpatient facility     Pain  Pain Score: 6   Pain Location: Generalized  Pain Descriptors: Aching  Pain Intervention(s): Repositioned;Ambulation/increased activity    Vision  Vision/Perception  Overall Vision/ Perception: Impaired  Impairments: Basic Vision  Current Vision: Legally blind (Legally blind in left eye)     Cognition  Overall Cognitive Status: Impaired  Cognitive Assessment: Arousal/ Alertness;Following Commands;Safety Judgment;Orientation Level  Arousal/Alertness: Lethargic;Delayed response to stimuli;Responds to verbal cues  Orientation Level: Oriented X4;with increased time  Following Commands: Follows one step commands;Requires demonstration;Requires repetition of instruction;Requires increased time;Requires verbal cues;Requires tactile cues  Safety Judgment: Impaired judgment;Decreased awareness of safety precautions    Neuromuscular  Overall Sensation: Patient denies any numbness/ tingling in BUE's/ BLEs        Upper Extremity   See OT assessment  Lower Extremity  Lower Extremity  LE Assessment: Tone normal;X  Strength RLE  R Hip Flexion: 2-/5  R Knee Flexion: 2-/5  R Knee Extension: 2-/5  R Ankle Dorsiflexion: 1/5  R Ankle Plantar Flexion: 1/5  Strength LLE  L Hip Flexion: 1/5  L Knee Flexion: 1/5  L Knee Extension: 1/5  L Ankle Dorsiflexion: 1/5  L Ankle Plantar Flexion: 1/5    Functional Mobility  Bed Mobility   Rolling: Total assistance;of 2 people;towards the right;use of handrail (PT/OT positioned pillow between legs  for maintaining posterior hip precautions with rolling)  Supine to Sit: Total assistance;of 2 people (PT/OT positioned bed in chair position with head in neutral due to right sided cervical flexion with pillow support and heel protectant boots.)  Sit to Supine: Total assistance;of 2 people (PT/OT position HOB elevated with bed flat and PEG tube paused and requires total assistance for neutral head/neck, trunk and B LE positioning in midline.)  Unable to progress further due to: PT/OT unable to perform further functional mobilitly due to decreased level of alertness, unable to maintain eyes open and patient resistant to mobility despite maximal encouragement/education.  Balance  Sitting - Static: Dependent  Sitting-Dynamic: Dependent   Standing-Static: Not Tested  Standing-Dynamic: Not Tested      Vitals:    HR  BP  O2 sats       At rest:   91 bpm 145/88 (102) 95% on 3L    After Activity:  97 bpm 155/96 (114) 95% on 3L    Position after Therapy and Safety Handoff  Position after treatment and safety handoff  Position after therapy session: Bed in chair position  Details: RN notified;Call light/ needs within reach;Other  Additional Comments: Head/neck positioned in neutral and B heel protectant boots in place  Alarms: Bed  Alarms Status: Activated and Interfaced with call system     Patient Education   PT provided education on benefits of skilled physical therapy and risks of immobility.  PT educated patient  on need for assisstance with all mobility and out of bed activity. Patient verbalized good understanding.    Time  Start Time: 0847  Stop Time: 0913  Time Calculation (min): 26 min    Charges   $PT Evaluation Mod Complex 30 Min: 1 Procedure       CPT codes  A. Personal factors and/or Comorbidities / Patient History that impacts plan of care:  See above PMH / PSH / and problem list.     High Complexity: 3 or more      B. An examination of body systems(s) musculoskeletal, neuromuscular, cardiovascular/pumonary,  integumentary using standardized tests and measures:  Refer to above examination for details.       Moderate Complexity: 3 or more elements      C. A clinical presentation with:     Moderate Complexity: Evolving with Changing Characteristics     D. Clinical decision making using standardized patient assessment instrument and/or measurable assessment of functional outcome.     Moderate Complexity [address]    Karma [place], [place], [place]  Physical Therapist   License # [id number]  01/26/2022  [phone]

## 2022-01-27 ENCOUNTER — Inpatient Hospital Stay: Admit: 2022-01-27 | Payer: MEDICARE

## 2022-01-27 LAB — POC GLU MONITORING DEVICE
POC Glucose Monitoring Device: 125 mg/dL (ref 70–100)
POC Glucose Monitoring Device: 132 mg/dL (ref 70–100)
POC Glucose Monitoring Device: 148 mg/dL (ref 70–100)
POC Glucose Monitoring Device: 113 mg/dL (ref 70–100)

## 2022-01-27 MED ORDER — levoFLOXacin (LEVAQUIN) tablet 750 mg
750 | Freq: Every day | ORAL | Status: AC
Start: 2022-01-27 — End: 2022-01-28
  Administered 2022-01-28: 13:00:00 750 mg via ORAL

## 2022-01-27 MED ORDER — sodium chloride 3 % nebulizer solution 3 mL
3 | Freq: Two times a day (BID) | RESPIRATORY_TRACT | Status: AC
Start: 2022-01-27 — End: 2022-01-28
  Administered 2022-01-27 – 2022-01-28 (×3): 3 mL via RESPIRATORY_TRACT

## 2022-01-27 MED ORDER — amLODIPine (NORVASC) tablet 10 mg
10 | Freq: Every day | ORAL | Status: AC
Start: 2022-01-27 — End: 2022-01-28
  Administered 2022-01-27 – 2022-01-28 (×2): 10 mg via ORAL

## 2022-01-27 MED ORDER — amLODIPine (NORVASC) 10 MG tablet
10 | ORAL_TABLET | Freq: Every day | ORAL | 0 refills | Status: AC
Start: 2022-01-27 — End: ?

## 2022-01-27 MED ORDER — levoFLOXacin (LEVAQUIN) 500 MG tablet
500 | Freq: Every day | ORAL | 0 refills | Status: AC
Start: 2022-01-27 — End: 2022-01-28

## 2022-01-27 MED ORDER — levoFLOXacin (LEVAQUIN) tablet 500 mg
500 | Freq: Every day | ORAL | Status: AC
Start: 2022-01-27 — End: 2022-01-27
  Administered 2022-01-27: 18:00:00 500 mg via ORAL

## 2022-01-27 MED ORDER — hydrALAZINE (APRESOLINE) 20 mg/mL injection 5 mg
20 | Freq: Four times a day (QID) | INTRAMUSCULAR | Status: AC | PRN
Start: 2022-01-27 — End: 2022-01-28

## 2022-01-27 MED ORDER — amLODIPine (NORVASC) tablet 2.5 mg
2.5 | Freq: Every day | ORAL | Status: AC
Start: 2022-01-27 — End: 2022-01-27

## 2022-01-27 MED ORDER — enoxaparin (LOVENOX) 30 mg/0.3 mL Syrg
30 | Freq: Every day | SUBCUTANEOUS | 0.00 refills | 20.00000 days | Status: AC
Start: 2022-01-27 — End: ?

## 2022-01-27 MED FILL — ATORVASTATIN 10 MG TABLET: 10 10 MG | ORAL | Qty: 1

## 2022-01-27 MED FILL — PIPERACILLIN-TAZOBACTAM 3.375 GRAM INTRAVENOUS SOLUTION: 3.375 3.375 gram | INTRAVENOUS | Qty: 1

## 2022-01-27 MED FILL — TYLENOL 325 MG TABLET: 325 325 mg | ORAL | Qty: 2

## 2022-01-27 MED FILL — NEBUSAL 3 % SOLUTION FOR NEBULIZATION: 3 3 % | RESPIRATORY_TRACT | Qty: 4

## 2022-01-27 MED FILL — LEVOFLOXACIN 500 MG TABLET: 500 500 MG | ORAL | Qty: 1

## 2022-01-27 MED FILL — FAMOTIDINE 20 MG TABLET: 20 20 MG | ORAL | Qty: 1

## 2022-01-27 MED FILL — AMLODIPINE 10 MG TABLET: 10 10 MG | ORAL | Qty: 1

## 2022-01-27 MED FILL — MIRTAZAPINE 15 MG TABLET: 15 15 MG | ORAL | Qty: 1

## 2022-01-27 MED FILL — ENOXAPARIN 30 MG/0.3 ML SUBCUTANEOUS SYRINGE: 30 30 mg/0.3 mL | SUBCUTANEOUS | Qty: 0.3

## 2022-01-27 NOTE — Unmapped (Signed)
Clinical Pharmacy Service - Renal Dosing Progress Note    Subjective/Objective:  Michelle Ponce is a 71 y.o. female being treated with levofloxacin for PNA/UTI    Vitals:    01/27/22 0800 01/27/22 0900 01/27/22 1000 01/27/22 1231   BP: (!) 160/96  (!) 169/101    BP Location:   Right arm    Patient Position:   Lying    Pulse: 94  94    Resp: 20  18    Temp:       TempSrc:       SpO2: 99% 98% 100% 96%   Weight:       Height:         Ht Readings from Last 1 Encounters:   01/25/22 5' 6 (1.676 m)     Wt Readings from Last 2 Encounters:   01/24/22 (!) 95 lb 0.3 oz (43.1 kg)   01/05/22 (!) 96 lb 1.9 oz (43.6 kg)     Female patients must weigh at least 45.5 kg to calculate ideal body weight       Lab 01/26/22  0357 01/24/22  1653   CREATININE 0.25* 0.22*   BUN 13 30*     Weight used to calculate CrCl: 43.1 kg  Estimated CrCl = > 50 mL/min (urine output 2.1 mL/kg/hr on 4/25, not on diuretics)      Assessment/Plan:  ?? Renal dose changes made based on a CrCl of > 50 mL/min (urine output 2.1 mL/kg/hr on 4/25, not on diuretics)  ?? Dose adjustments are summarized in the table below.    Current Drug Therapy and Dose New Drug Therapy and Dose   Levofloxacin 500 mg daily Levofloxacin 750 mg daily       Renal dose change was made per DPD Committee Policy, [facility]-RX-MED [id number]; Renal Dosing: Protocol for renal function-based dose adjustments. Contact pharmacy at [facility]: [phone] if there are any questions. Exceptions to the pharmacy renal dosing policy include: CVVH, antivirals in transplant patients, medications not listed on the dosing guideline, Dose as Written indicated in the medication order.    Thanks,    Lew Dawes, PharmD   PGY1 Pharmacy Resident  01/27/2022 2:27 PM  Contact #: [phone]

## 2022-01-27 NOTE — Unmapped (Addendum)
Physical Therapy                                                Treatment and Tentative Discharge    Name: Michelle Ponce  DOB: 1951/08/04  Attending Physician: Kirtland Bouchard, MD  Admission Diagnosis: Complicated UTI (urinary tract infection) [N39.0]  Pneumonia of both lungs due to infectious organism, unspecified part of lung [J18.9]  Sepsis with encephalopathy without septic shock, due to unspecified organism (CMS-HCC) [A41.9, R65.20, G93.40]  Date: 01/27/2022  Reviewed Pertinent hospital course: Yes  Hospital Course PT/OT: Pt is a 71 y/o female presenting with SOB and lethargy, dx: sepsis, PNA, UTI. Pt with recent hip fx s/p repair on 3/14 at outside hospital. [facility]) Pt sustained a mechanical fall, on 12/14/2021 and she was found to have an intertrochanteric fracture of the left hip and s/p left hip hemiarthroplasty,S/p PEG placement 3/16 at [facility])  Relevant PMH : hip fx s/p repair, legally blind, dysphagia w/ g-tube placement  Precautions: FWB LLE per care-everwhere (hemi-arthroplasty listed, but assuming posterior approach); Fall precautions; (PEG tube); Oxygen Saturation >90%  Activity Level: Activity as tolerated    Assessment  PT 6 Clicks  Help From Another Person Turning From Back to Side While Flat in Bed Without Using Siderails: Total  Help From Another Person Moving From Lying On Back To Sitting Without Using Siderails: Total  Help From Another Person Moving To And From Bed To Chair: Total  Help From Another Person Standing Up From Chair Using Your Arms: Total  Help From Another Person To Walk In Hospital Room: Total  Help From Another Person Climbing 3-5 Steps With A Railing: Total  PT 6 Clicks Score: 6  Assessment: Impaired Bed Mobility, Impaired Transfers, Impaired Balance, Impaired Strength, Impaired ROM, Impaired Safety Awareness, Impaired Cognition, Deconditioning, Impaired Activity Tolerance        Prognosis: Fair      Goals  Collaborated with: Patient  Patient Stated  Goal: to decrease pain during mobility  Goals to be met by: 02/02/22  Patient will transition from supine to sit: Maximum assistance  Patient will transition from sit to supine: Maximum assistance  Patient will sit edge of bed: Minimal assistance  Miscellaneous Goal #1: Patient will be able to participated with B LE HEP with moderate assistance for increased strength and AROM while maintaining assumed posterior hip precautions on L LE. Goal Met 01/27/2022  Miscellaneous Goal #2: Patient will be able to maintain eyes open 100% of session and follow one step commands. Goal Met 01/27/2022      Recommendation  Plan  Treatment/Interventions: LE strengthening/ROM, Endurance training, Patient/family training, Therapeutic Activity, Therapeutic Exercise, Neuromuscular Reeducation  PT Frequency: minimum 2x/week    Recommendation  Recommendation: Short-term skilled PT  Equipment Recommended: Defer to facility to obtain    Problem List  Patient Active Problem List   Diagnosis   ??? Disorientation   ??? Aspiration into respiratory tract   ??? Moderate protein-calorie malnutrition (CMS-HCC)        Past Medical History  Past Medical History:   Diagnosis Date   ??? Hypercholesteremia    ??? Hypertension    ??? Stroke (CMS-HCC)         Past Surgical History  History reviewed. No pertinent surgical history.    Cognition:  Orientation Level: Oriented X4     Pain:  Pain Score: 5   Pain Location: Hip  Pain Descriptors: Aching  Pain Intervention(s): Repositioned;Ambulation/increased activity  Therapist reported pain to: RN-Jenn notified       Mobility:  Bed Mobility  Rolling: Total assistance;towards the left;towards the right;use of handrail (PT used bed controls for repositioning with pillow between knees to maintain left hip precautions.)  Supine to Sit: Total assistance;head of bed elevated (PT positioned bed in chair position at end of session with pillows for trunk, head neck for neutral spine and upright sitting.)  Unable to progress further  due to: patient declines further functional mobility due to fatigue despite education/encouragement. PT positioned bed in chair position at end of session.     Vitals:    HR  BP  O2 sats       After Activity:  91 bpm 146/93  93% on 1L      Exercise:  Supine  Exercises: Ankle pumps;Quad sets;Gluteal sets;Short arc quads;ABduction/ADduction;Heel slides;Bilateral LE;10 reps  Reps/Comments: Patient performs B LE AAROM in supine with moderate assistance while maintaining left posterior hip precautions. PT issued HEP to husband at end of session and patient's husband at end of session and patient's husband verbalized good understanding.    Position after Treatment and Safety Handoff  Position after therapy session: Bed in chair position  Details: RN notified;UE elevated on pillow for edema control;Call light/ needs within reach;visitor present;Other  Additional Comments: Head/neck positioned in midline with pillows and bilateral heel protectant boots in place and pillow between legs  Alarms: Bed  Alarms Status: Activated and Interfaced with call system      Patient Education   PT provided education on benefits of skilled physical therapy and risks of immobility.  PT educated patient on need for assisstance with all mobility and out of bed activity. Patient verbalized good understanding.    Tentative Discharge Summary:  Patient with tentative discharge scheduled, should patient discharge prior to next PT treatment session, please allow this note to serve as a tentative discharge summary. Patient met 2 out of 5 goals. Goals not fully met secondary to medical complexity.  No equipment issued. Recommend SNF with ongoing therapy services. Should patient remain in hospital [facility]), will resume PT per POC.    Time  Start Time: 1156  Stop Time: 1225  Time Calculation (min): 29 min    Charges       $Therapeutic Exercise: 8-22 mins  $Therapeutic Activity: 1 unit       Karma [place], [place], [place]  Physical Therapist   License  # [id number]  01/27/2022  [phone]

## 2022-01-27 NOTE — Unmapped (Signed)
Problem: Patient will remain free of injury d/t fall  Goal: High Fall Risk Precautions  Outcome: Progressing  Goal: Additional High Fall Risk Precautions (consider the following)  Outcome: Progressing     Problem: Acute Pain  Description: Patient's pain progressing toward patient's stated pain goal  Goal: Patient displays improved well-being such as baseline levels for pulse, BP, respirations and relaxed muscle tone or body posture  Outcome: Progressing  Goal: Patient will manage pain with the appropriate technique/intervention  Description: Assess and monitor patient's pain using appropriate pain scale. Collaborate with interdisciplinary team and initiate plan and interventions as ordered.  Re-assess patient's pain level 30-60 minutes after pain management intervention.  Outcome: Progressing  Goal: Patient will reduce or eliminate use of analgesics  Outcome: Progressing  Goal: Patients pain is managed to allow active participation in daily activities  Outcome: Progressing  Goal: Patient verbalizes a reduction in pain level  Outcome: Progressing  Goal: Discharge Pain Management Plan (Acute Pain)  Outcome: Progressing     Problem: Acute Pain  Description: Patient's pain progressing toward patient's stated pain goal  Goal: Patient will manage pain with the appropriate technique/intervention  Description: Assess and monitor patient's pain using appropriate pain scale. Collaborate with interdisciplinary team and initiate plan and interventions as ordered.  Re-assess patient's pain level 30-60 minutes after pain management intervention.  Outcome: Progressing     Problem: Acute Pain  Description: Patient's pain progressing toward patient's stated pain goal  Goal: Patient will reduce or eliminate use of analgesics  Outcome: Progressing

## 2022-01-27 NOTE — Unmapped (Signed)
Subjective:  Patient is on nasal cannula 2-3 L/min, overall comfortable, generally weak, indicates no complaints, no new issues per RN      Continuous Infusions:    Objective:    Vital signs in last 24 hours:  Temp:  [97.6 ??F (36.4 ??C)-99.1 ??F (37.3 ??C)] 97.8 ??F (36.6 ??C)  Heart Rate:  [87-113] 113  Resp:  [18-24] 23  BP: (129-169)/(84-101) 129/91    Intake/Output last 3 shifts:  I/O last 3 completed shifts:  In: 2303.4 [P.O.:50; NG/GT:1689; IV Piggyback:564.4]  Out: 3050 [Urine:3050]  Intake/Output this shift:  I/O this shift:  In: 420 [NG/GT:420]  Out: 1250 [Urine:1250]  Last BMP Result:      Lab 01/26/22  0357   SODIUM 135   POTASSIUM 3.8   CHLORIDE 99   CO2 29   BUN 13   CREATININE 0.25*   CALCIUM 9.0     Last CBC Result:      Lab 01/26/22  0357   WBC 6.6   HEMATOCRIT 27.9*   PLATELETS 346   RBC 2.70*     No results for input(s): PROTIME, INR in the last 72 hours.    Physical Exam:    Vitals:    01/27/22 1231 01/27/22 1400 01/27/22 1600 01/27/22 1631   BP:  130/84 (!) 129/91    BP Location:       Patient Position:       Pulse:  88 113    Resp:  20 23    Temp:   97.8 ??F (36.6 ??C)    TempSrc:   Axillary    SpO2: 96% 97% 94% 94%   Weight:       Height:         General: Chronically ill-appearing lady, NAD  L:CTA, no W/R/R, not labored  H:RRR, S1S2  ZOX:WRUE ND/NT, normal BS, PEG tube in place.   Ext: no edema    Assessment/Plan:  ??  ??  RML/RLL pneumonia, likely aspiration: initially on Zosyn, now changed to Levaquin, vancomycin is d/c'd, NGTD on blood cultures, MRSA screen is negative, completed MBS and cleared for puree diet, however oral intake is poor   ??  Hypoxemia: Secondary to above currently resolved and the patient is currently on NC 3 L/min, wean O2 as tolerted  ??  Pseudomonas UTI:  Zosyn changed to Levquin as above    ??  Hx of CVA with residual left-sided weakness: Continue statin,?  Not on aspirin.  ??  Dysphagia s/p PEG tube: Tube feed is resume, completed MBS and started on puree diet, oral intake is  poor  ??  Severe protein calorie malnutrition: Further management as above  ??  Anemia of chronic disease: Stable hemoglobin, monitor for now.  Hyperlipidemia: Continue statin.  ??  Essential hypertension: BP is up, started on Norvasc  ??  GERD: Continue PPI  ??  DVT prophylaxis SQ Lovenox.    Dispo: likely ECF tomorrow, if weaned off O2    ??      Michelle Ponce  01/27/2022

## 2022-01-27 NOTE — Unmapped (Signed)
Pulmonary Medicine Progress Note  Encounter date:  01/27/2022  Name:  Michelle Ponce Date/Time of Admission: 01/24/2022  4:20 PM    CSN: [id number] Attending Provider: Kirtland BouchardMotaz Hossein, MD   Room/Bed: 222/M222 DOB: 1951-01-16 Age: 71 y.o.     Clinical history   Michelle Ponce is a 71 y.o. female with a history of HTN, HLD, ICH, dysphagia/malnutrition with G-tube placed 3/14 during admission for hip fx after a fall due to severe malnutrition and poor PO intake , recent hip fx after fall s/p repair 12/15/21 at OSH, Legally blind who presented to the ER from nursing facility with SOB, lethargy and tachycardia. [facility]5/23 for abd pain and urinary retention. Infectious work up negative, abx discontinued. Seen by SLP advised NPO with tube feedings, evaluated by nutritional services due to malnutrition. CXR showing R sided airspace disease concerning for possible aspiration pneumonia, improved L sided airspace disease compared to previous    Reason for Consult:   Pneumonia   SUBJECTIVE    Drowsy, oriented x 3, continuously repetitive biting, says she is not sure why she is doing it and able to stop. Denies any sob, complains of side pain but cannot give details       ASSESSMENT   1. Acute hypoxemia  2. Pneumonia likely aspiration  3. Pseudomonas UTI  4. Dysphagia s/p G tube  5. Febrile illness  6. Hx ICH  7. HTN  8. HLD  9. Legally Blind  10. Failure to thrive      PLAN   1. ABX: zosyn D4, can change to PO/Gtube at discharge  2. Aggressive airway clearance, will add 3% nebs, not sure she could use flutter device  3. Follow cultures  4. Wean O2 as tolerated, check RA SpO2  5. SLP eval appreciated cont current dietary recommendation and aspiration precautions  6. TF recommendations per nutrition  7. Consider re-consulting Palliative care for goals of care given another admission  8. PT/OT  9. Will get repeat cxr with persistent oxygen requirement    Interpretation of Data after review:  98% on 2Lnc, Tmax  99.1, HR stable, BP elevated, wt not recorded, I/O not -308 ? accuracy, no new lab, no new imaging, Ucx + pan sensitive pseudomonas,         The HPI, ROS, physical exam, test results, and assessment & plan were reviewed and copied forward (with edits) from a note written by myself on 01/26/22. I have reviewed and updated the history, physical exam, data, assessment, and plan of the note so that it reflects my evaluation and management of the patient.    Electronically signed: Runell GessNancy Irene Lyndell Allaire, CNP4/26/2023 9:35 AM       Other Exam and Data Findings:  OBJECTIVE   VITAL SIGNS:  Vital Sign Range (last 24 hours)  Temp:  [97.6 ??F (36.4 ??C)-99.1 ??F (37.3 ??C)] 99 ??F (37.2 ??C)  Heart Rate:  [87-99] 94  Resp:  [19-24] 19  BP: (133-169)/(87-100) 142/93   I/O last 3 completed shifts:  In: 2303.4 [P.O.:50; QM/VH:8469G/GT:1689; IV Piggyback:564.4]  Out: 3050 [Urine:3050]  Wt Readings from Last 3 Encounters:   01/24/22 (!) 95 lb 0.3 oz (43.1 kg)   01/05/22 (!) 96 lb 1.9 oz (43.6 kg)   12/26/21 114 lb 14.4 oz (52.1 kg)       CURRENT MEDS:  Current Facility-Administered Medications   Medication Dose Route Frequency Provider Last Rate Last Admin   ??? acetaminophen (TYLENOL) tablet 650 mg  650 mg  Oral Q4H PRN Newman Pies, MD   650 mg at 01/27/22 0521   ??? atorvastatin (LIPITOR) tablet 10 mg  10 mg Per G Tube Daily 0900 Newman Pies, MD   10 mg at 01/26/22 1133   ??? enoxaparin (LOVENOX) for prophylaxis syringe 30 mg/0.3 mL  30 mg Subcutaneous Daily 0900 Lotfi Mamlouk, MD   30 mg at 01/26/22 1133   ??? famotidine (PEPCID) tablet 20 mg  20 mg Oral BID Lotfi Mamlouk, MD   20 mg at 01/26/22 2013   ??? hydrALAZINE (APRESOLINE) 20 mg/mL injection 5 mg  5 mg Intravenous Q6H PRN Jacqulyn Ducking Tresea Mall, CNP       ??? magnesium hydroxide (MILK OF MAGNESIA) 2,400 mg/10 mL oral suspension 10 mL  10 mL Oral Daily PRN Newman Pies, MD       ??? mirtazapine (REMERON) tablet 15 mg  15 mg Oral Nightly (2100) Newman Pies, MD   15 mg at 01/26/22 2013   ??? ondansetron  (ZOFRAN) injection 4 mg  4 mg Intravenous Q8H PRN Lotfi Mamlouk, MD       ??? piperacillin-tazobactam (ZOSYN) 3.375 g in sodium chloride 0.9% 100 mL ADV IVPB  3.375 g Intravenous 3 times per day Newman Pies, MD 25 mL/hr at 01/27/22 0520 3.375 g at 01/27/22 0520        FiO2:  2Lnc  PHYSICAL EXAM:   GEN: No respiratory distress, alert and oriented x3, very thin, chronically ill appearing  HEENT: Atraumatic, normocephalic, pupils equal reactive to light, extraocular movements intact, sclera anicteric, no maxillary or frontal sinus tenderness to palpation,  mucous membranes dry, no oral lesions. Mallampati grade 3.  NECK:  supple, no masses, no lymphadenopathy  CARDIOVASCULAR: Regular Rate and Rhythm, no murmurs gallops or rubs. Radial pulse 2+ and equal.   LUNGS: Diminished to ausculation bilaterally, no wheeze, no rhonchi, no rales, no egophony, no dullness to percussion, congested weak cough  ABDOMEN: Soft, non-tender, nondistended, bowel sounds present  SKIN: Warm, very dry, peeling bilateral LE  EXTREMITIES: No clubbing, no pitting edema.  NEURO: CN 2-12 grossly intact. No gross focal deficits.    PSYCH: Flat/depressed affect and mood.    LABS:  Recent Results (from the past 24 hour(s))   POC Glucose Monitoring Device    Collection Time: 01/26/22 11:56 AM   Result Value Ref Range    POC Glucose Monitoring Device 117 (H) 70 - 100 mg/dL   POC Glucose Monitoring Device    Collection Time: 01/26/22  6:23 PM   Result Value Ref Range    POC Glucose Monitoring Device 131 (H) 70 - 100 mg/dL   POC Glucose Monitoring Device    Collection Time: 01/26/22 11:52 PM   Result Value Ref Range    POC Glucose Monitoring Device 125 (H) 70 - 100 mg/dL   POC Glucose Monitoring Device    Collection Time: 01/27/22  5:47 AM   Result Value Ref Range    POC Glucose Monitoring Device 132 (H) 70 - 100 mg/dL       Microbiology  1/61/09 MRSA nares negative  01/24/22 Blood Cx x2 NGTD  01/24/22 Ucx Pseudomonas    Imaging  Echo 2D Complete  (TTE)    Result Date: 01/05/2022                            * [facility]  Department of Cardiology*                        [address]                       [place], [place] [address]                            [phone] Transthoracic Echocardiography Patient:    Stacye, Noori MR #:       [id number] Account: Study Date: 01/05/2022 Gender:     F Age:        85 DOB:        1951/02/02 Room:       [facility]  REFERRING    Thedore Mins,  PERFORMING   [facility], [facility]  SONOGRAPHER  Cheryll Cockayne, Manoj K  Ollen Bowl, Brad A  ATTENDING    Thedore Mins, Manoj K  ADMITTING    Clarisa Schools A ------------------------------------------------------------------- Procedure:ECHO 2D COMPLETE (TTE)              Order: Accession Number:[id number] ------------------------------------------------------------------- Indications:      (I42.9 Cardiomyopathy- unspecified). ------------------------------------------------------------------- PMH:   Cerebrovascular accident.  Risk factors:  Hypertension.  ------------------------------------------------------------------- Study data:  Height: 62in. 157.5cm. Weight: 95.8lb. 43.5kg.  Study status:  Routine.  Procedure:  Transthoracic echocardiography. Image quality was good. Scanning was performed from the parasternal, apical, and subcostal acoustic windows. Intravenous contrast (Optison) was administered.          Transthoracic echocardiography.  M-mode, complete 2D, complete spectral Doppler, and color Doppler.  Birthdate:  Patient birthdate: 02/21/1951. Age:  Patient is 71yr old.  Sex:  Birth gender: female.  Body mass index:  BMI: 17.6kg/m^2.  Body surface area:    BSA: 1.33m^2.  Blood pressure:     125/65  Patient status:  Inpatient.  Study date: Study date: 01/05/2022. Study time: 12:30 PM.  Location:  Echo laboratory. ------------------------------------------------------------------- Study Conclusions - Left ventricle:  The cavity size is normal. Wall thickness was increased in a pattern of moderate   LVH. Systolic function was normal. The estimated ejection fraction was in the range of 55% to 60%.   Wall motion was normal; there were no regional wall motion abnormalities. Doppler parameters are   consistent with abnormal left ventricular relaxation (grade 1 diastolic dysfunction). - Aortic valve: Moderate regurgitation. - Mitral valve: Mildly calcified annulus. Mild thickening. Mild regurgitation. - Right ventricle: Systolic function was normal by objective interpretation. - Tricuspid valve: Mild-moderate regurgitation. - Pulmonary arteries: Systolic pressure was mildly increased, estimated to be 35mm Hg assuming that   the right atrial pressure was 3 mmHg. - Pericardium, extracardiac: A trivial pericardial effusion is identified. There is a left pleural   effusion. ------------------------------------------------------------------- Cardiac Anatomy Left ventricle: - The cavity size is normal. Wall thickness was increased in a pattern of moderate LVH. Systolic   function was normal. The estimated ejection fraction was in the range of 55% to 60%. Wall motion   was normal; there were no regional wall motion abnormalities. - Wall motion score: 1.00. - Doppler parameters are consistent with abnormal left ventricular relaxation (grade 1 diastolic   dysfunction). Aorta:   Aortic root: - The aortic root is normal in size. Aortic valve: - Trileaflet; normal thickness leaflets. Mobility was not restricted. Doppler: - Transvalvular velocity is  within the normal range. There is no stenosis. Moderate regurgitation. Mitral valve: - Mildly calcified annulus. Mild thickening. Mobility was not restricted. Doppler: - Transvalvular velocity is within the normal range. There is no evidence for stenosis. Mild   regurgitation. Left atrium: - The atrium is normal in size. Pulmonary artery: - Systolic pressure was mildly increased, estimated to be 35mm  Hg assuming that the right atrial   pressure was 3 mmHg. Systemic veins: Inferior vena cava: The vessel was normal in size. Right ventricle: - The cavity size is normal. Wall thickness is normal. Systolic function was normal by objective   interpretation. Pulmonic valve:    Doppler: - Transvalvular velocity is within the normal range. There is no evidence for stenosis. No   regurgitation. Tricuspid valve: - Structurally normal valve. Doppler: - Transvalvular velocity is within the normal range. Mild-moderate regurgitation. Right atrium: - The atrium is normal in size. Pericardium: - A trivial pericardial effusion is identified. There is a left pleural effusion. Systemic veins: Inferior vena cava: - The vessel was normal in size. ------------------------------------------------------------------- Measurements  Left ventricle                Value        Ref  EDD, LAX             (L)      3.4   cm     3.8 - 5.2  ESD, LAX             (N)      2.4   cm     2.2 - 3.5  EDD/bsa, LAX         (N)      2.4   cm/m^2 2.3 - 3.1  ESD/bsa, LAX         (N)      1.7   cm/m^2 1.3 - 2.1  FS, LAX              (N)      29    %      27 - 45  FS, LAX chord        (N)      29    %      27 - 45  ESD                  (N)      2.4   cm     2.2 - 3.5  ESD/bsa              (N)      1.7   cm/m^2 1.3 - 2.1  PW, ED               (H)      1.4   cm     0.6 - 0.9  IVS/PW, ED                    1            ----------  EDV                  (N)      47    ml     46 - 106  ESV                  (N)      20    ml     14 - 42  EF                   (  N)      57    %      54 - 74  EDV/bsa              (N)      34    ml/m^2 29 - 61  ESV/bsa              (N)      14    ml/m^2 8 - 24  SV, 1-p A2C                   27    ml     ----------  SV/bsa, 1-p A2C               19.4  ml/m^2 ----------  SV, 1-p A4C                   26    ml     ----------  SV/bsa, 1-p A4C               18    ml/m^2 ----------  E', lat ann, TDI     (L)      6.9   cm/sec >=10.0  E/e', lat  ann, TDI            11           ----------  E', med ann, TDI     (L)      5.0   cm/sec >=7.0  E/e', med ann, TDI            14           ----------  E', avg, TDI                  5.9   cm/sec ----------  E/e', avg, TDI       (N)      12           <=14   Ventricular septum            Value        Ref  IVS, ED              (H)      1.4   cm     0.6 - 0.9   Right ventricle               Value        Ref  TAPSE, MM            (N)      2.1   cm     1.7 - 3.1  S' lateral           (H)      16.2  cm/sec 6.0 - 13.4   Left atrium                   Value        Ref  Area ES, A4C         (N)      13    cm^2   <=20  Area ES, A2C                  15    cm^2   ----------  SI dim, A2C                   5.1   cm     ----------  Vol, ES, 1-p A4C     (  N)      29    ml     22 - 52  Vol/bsa, ES, 1-p A4C (N)      21    ml/m^2 11 - 40  Vol, ES, 1-p A2C     (N)      37    ml     22 - 52  Vol/bsa, ES, 1-p A2C (N)      26    ml/m^2 13 - 40  Vol, ES, 2-p                  34    ml     ----------  Vol/bsa, ES, 2-p     (N)      25    ml/m^2 16 - 34   Right atrium                  Value        Ref  Area, ES, A4C        (L)      9     cm^2   10 - 18   Aortic valve                  Value        Ref  Peak v, S                     1.4   m/sec  ----------  AR peak v                     5.34  m/sec  ----------  AR PHT                        344   ms     ----------  AR peak grad                  114   mm Hg  ----------   Mitral valve                  Value        Ref  Peak E                        0.72  m/sec  ----------  Peak A                        0.94  m/sec  ----------  Decel time                    99    ms     ----------  Peak grad, D                  2     mm Hg  ----------  Peak E/A ratio                0.8          ----------   Pulmonic valve                Value        Ref  Peak v, S                     0.8   m/sec  ----------   Tricuspid valve  Value        Ref  TR peak v            (N)      2.8   m/sec  <=2.8  Peak RV-RA  grad, S            31    mm Hg  ----------  Max TR vel                    2.05  m/sec  ----------  Legend: (L)  and  (H)  mark values outside specified reference range. (N)  marks values inside specified reference range. Reviewed and confirmed by Lionel December 2023-04-04T16:28:49    CT Abdomen and Pelvis WO IV contrast    Result Date: 01/03/2022  EXAM: CT CHEST WO CONTRAST EXAM: CT ABDOMEN AND PELVIS WO CONTRAST INDICATION: Cough, chronic/persisting > 8 weeks, failed empiric treatment,UTI, recurrent/complicated (Female) COMPARISON: Chest radiograph from the same day and CT abdomen pelvis from 12/26/2021. TECHNIQUE: Thin section CT images were obtained from the lung apices through the pelvis without contrast. Coronal and sagittal MPR was performed at the scanner. FIELD OF VIEW: 38 cm. FINDINGS: CHEST: Lower Neck: Unremarkable. Heart: The heart is not enlarged. Moderate to severe coronary artery calcifications. Mitral annular calcifications. Vascular Structures: Unremarkable. Mediastinum And Hila: No lymphadenopathy. Central airways: Central secretions most significant in the right mainstem bronchus with scattered subsegmental mucous plugging Pleura: Small right and moderate left pleural effusions. Lungs: Left greater than right passive atelectasis. Patchy peripheral predominant on the right and posterior predominant on the left patchy consolidations with scattered bandlike opacities. Right basilar predominant smooth septal thickening. Chest Wall: Anasarca. Osseous Structures: No acute osseous abnormalities or suspicious osseous lesions. Subacute sixth through eighth anterior right rib fractures. ABDOMEN/PELVIS: Liver: Noncirrhotic in morphology.  No focal liver lesions within the limitations of a noncontrast exam. Biliary Tree: No intra or extra-hepatic biliary ductal dilatation. Spleen: Normal Pancreas: Normal Adrenal Glands: Unchanged right adrenal nodule. Kidneys/Ureters/Bladder: No focal renal lesions.  No  hydronephrosis. The urinary bladder is not well visualized due to streak artifact from left hip arthroplasty. Gastrointestinal Tract: Gastrostomy tube terminates at the body/antrum junction. Gastric, small bowel, colonic caliber and wall thickness are within normal limits. The appendix is not definitively visualized. Lymphatics: No abdominal or pelvic lymph nodes are enlarged by size criteria. Vasculature: Scattered atherosclerotic plaque without aneurysmal dilatation. Peritoneum/Retroperitoneum: Small volume ascites. No free air or loculated fluid collections. Abdominal Wall/Soft Tissues: Unremarkable. Genital Structures: The lower pelvis is not well visualized due to streak artifact from left hip arthroplasty. Osseous Structures: No acute osseous abnormalities or suspicious osseous lesions. Levocurvature of the lumbar spine, which may be exaggerated due to positioning. Postsurgical changes associated with left hip arthroplasty. Partially visualized hardware appears intact without periprosthetic lucency. Moderate right hip right cirrhosis. Subacute or chronic fracture of the left obturator ring.     IMPRESSION: CHEST 1.  Central secretions most significant in the right mainstem bronchus with scattered subsegmental mucous plugging, which may related to aspiration. 2.  Small right and moderate left pleural effusions with associated passive atelectasis. 3.  Bilateral patchy consolidations, which may related to multifocal pneumonia or aspiration. 4.  Right basilar predominant smooth septal thickening, which may related to pulmonary edema. ABDOMEN/PELVIS 1.  Small volume ascites, otherwise no acute abnormality of the abdomen and pelvis identified within the limits of a noncontrast exam. Approved by Carey Bullocks, MD on 01/03/2022 9:46 PM EDT I have personally reviewed the images  and I agree with this report. Report Verified by: Zane Herald, MD at 01/03/2022 9:57 PM EDT    CT Chest WO contrast    Result Date: 01/03/2022  EXAM:  CT CHEST WO CONTRAST EXAM: CT ABDOMEN AND PELVIS WO CONTRAST INDICATION: Cough, chronic/persisting > 8 weeks, failed empiric treatment,UTI, recurrent/complicated (Female) COMPARISON: Chest radiograph from the same day and CT abdomen pelvis from 12/26/2021. TECHNIQUE: Thin section CT images were obtained from the lung apices through the pelvis without contrast. Coronal and sagittal MPR was performed at the scanner. FIELD OF VIEW: 38 cm. FINDINGS: CHEST: Lower Neck: Unremarkable. Heart: The heart is not enlarged. Moderate to severe coronary artery calcifications. Mitral annular calcifications. Vascular Structures: Unremarkable. Mediastinum And Hila: No lymphadenopathy. Central airways: Central secretions most significant in the right mainstem bronchus with scattered subsegmental mucous plugging Pleura: Small right and moderate left pleural effusions. Lungs: Left greater than right passive atelectasis. Patchy peripheral predominant on the right and posterior predominant on the left patchy consolidations with scattered bandlike opacities. Right basilar predominant smooth septal thickening. Chest Wall: Anasarca. Osseous Structures: No acute osseous abnormalities or suspicious osseous lesions. Subacute sixth through eighth anterior right rib fractures. ABDOMEN/PELVIS: Liver: Noncirrhotic in morphology.  No focal liver lesions within the limitations of a noncontrast exam. Biliary Tree: No intra or extra-hepatic biliary ductal dilatation. Spleen: Normal Pancreas: Normal Adrenal Glands: Unchanged right adrenal nodule. Kidneys/Ureters/Bladder: No focal renal lesions.  No hydronephrosis. The urinary bladder is not well visualized due to streak artifact from left hip arthroplasty. Gastrointestinal Tract: Gastrostomy tube terminates at the body/antrum junction. Gastric, small bowel, colonic caliber and wall thickness are within normal limits. The appendix is not definitively visualized. Lymphatics: No abdominal or pelvic lymph  nodes are enlarged by size criteria. Vasculature: Scattered atherosclerotic plaque without aneurysmal dilatation. Peritoneum/Retroperitoneum: Small volume ascites. No free air or loculated fluid collections. Abdominal Wall/Soft Tissues: Unremarkable. Genital Structures: The lower pelvis is not well visualized due to streak artifact from left hip arthroplasty. Osseous Structures: No acute osseous abnormalities or suspicious osseous lesions. Levocurvature of the lumbar spine, which may be exaggerated due to positioning. Postsurgical changes associated with left hip arthroplasty. Partially visualized hardware appears intact without periprosthetic lucency. Moderate right hip right cirrhosis. Subacute or chronic fracture of the left obturator ring.     IMPRESSION: CHEST 1.  Central secretions most significant in the right mainstem bronchus with scattered subsegmental mucous plugging, which may related to aspiration. 2.  Small right and moderate left pleural effusions with associated passive atelectasis. 3.  Bilateral patchy consolidations, which may related to multifocal pneumonia or aspiration. 4.  Right basilar predominant smooth septal thickening, which may related to pulmonary edema. ABDOMEN/PELVIS 1.  Small volume ascites, otherwise no acute abnormality of the abdomen and pelvis identified within the limits of a noncontrast exam. Approved by Carey Bullocks, MD on 01/03/2022 9:46 PM EDT I have personally reviewed the images and I agree with this report. Report Verified by: Zane Herald, MD at 01/03/2022 9:57 PM EDT    MRI Head W WO contrast    Result Date: 01/05/2022  EXAM: MRI BRAIN W WO CONTRAST INDICATION: Mental status change, unknown cause TECHNIQUE:  MRI brain performed including multiplanar T1 and T2 weighted sequences. DWI and SWAN imaging was also performed. Imaging was obtained prior to and after 5 mL of GADOBUTROL 1 MMOL/ML INTRAVENOUS SOLUTION [facility]) administered intravenously. COMPARISON: None available. FINDINGS:  Mild ventricular enlargement, right greater than left, related to prior infarction hemorrhage. Moderate prominence basal cisterns, fissures,  sulci. Multiple foci of susceptibility basal ganglia, cerebellum, with hemosiderin lined remote hematoma cavity of the right putamen and corona radiata. Multiple foci with periventricular confluence T2/FLAIR hyperintensity. Lacunar changes left putamen. Small focus diffusion restriction splenium corpus callosum. Orbits, paranasal sinuses, and mastoid regions: No acute orbital abnormality. Clear paranasal sinuses. Aerated mastoid air cells. Left globe banding procedure. Vascular structures: Normal arterial and venous flow voids. Other: Normal included extracranial structures. Upper cervical spine without significant abnormality.     IMPRESSION: 1. Foci and confluent areas T2/FLAIR hyperintensity with lacunar change, and multifocal susceptibility, consistent with chronic small vessel disease. 2. Remote right putamen/corona radiata hematoma cavity 3. Small focus diffusion restriction is splenium corpus callosum. Report Verified by: Burt Ek, MD at 01/05/2022 8:35 AM EDT    CT Abdomen and Pelvis With IV contrast    Result Date: 01/04/2022  EXAM: CT ABDOMEN AND PELVIS WITH IV CONTRAST INDICATION: LLQ abdominal pain, Abdominal pain, acute, nonlocalized TECHNIQUE: CT of the abdomen and pelvis was performed after the administration of intravenous contrast. Axial images were obtained with coronal and sagittal reconstructions. CONTRAST: 125 mL of IOHEXOL 350 MG IODINE/ML INTRAVENOUS SOLUTION administered intravenously FIELD OF VIEW: 38 cm COMPARISON: 01/03/2022 FINDINGS: Lower Chest: Moderate left and small right pleural effusions with adjacent atelectasis. Liver: Normal liver contour. No focal hepatic lesions. Biliary Tree: No intra or extra-hepatic biliary ductal dilatation. The gallbladder is unremarkable. Spleen: Unremarkable Pancreas: Unremarkable Adrenal Glands: 1.3 cm right  adrenal nodule demonstrates unenhanced characteristics most compatible with a benign lipid rich adrenal adenoma on the recent noncontrast chest CT. The left adrenal gland is normal. Kidneys/Ureters/Bladder: No dural multifocal renal cortical scarring. No suspicious renal lesions. No hydronephrosis. Urinary bladder is collapsed around a Foley catheter Gastrointestinal Tract: Percutaneous gastrostomy tube is in expected position. Large colonic stool burden. Bowel caliber and wall thickness are otherwise within normal limits. The appendix is normal. Lymphatics: No abdominal or pelvic lymph nodes are enlarged by size criteria. Vasculature: Moderate atherosclerotic plaque without aneurysmal dilatation of the abdominal aorta. 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 Incidentally noted left drain the left renal vein. Peritoneum/Retroperitoneum: Small volume ascites. No rim-enhancing fluid collections. No free air. Abdominal Wall/Soft Tissues: Severe diffuse body wall edema. Genital Structures: Uterus is present. No adnexal masses. Osseous Structures: No acute osseous abnormalities or suspicious osseous lesions. Convex left thoracolumbar scoliosis. Left hip arthroplasty. Moderate to severe degenerative changes in the right hip. Nonacute left obturator ring fracture deformity.     IMPRESSION: 1.  Severe anasarca including a small volume of ascites with moderate left and small right pleural effusions 2.  Large stool burden 3.  Left greater than right pelvic varices. Recommend correlation with any signs or symptoms of pelvic congestion syndrome. Report Verified by: Annett Fabian, MD at 01/04/2022 4:56 PM EDT    FL MODIFIED BARIUM SWALLOW, INC SCOUT/DELAYED IMAGES w SPEECH Therapy    Result Date: 01/25/2022  EXAM: FL MODIFIED BARIUM SWALLOW INC SCOUT/DELAYED IMAGES WITH SPEECH THERAPY Indication: r/o silent aspiration Comparison:None Technique: Video fluoroscopic swallowing  examination was performed in conjunction with the speech pathology department. Various liquid and solid foods substances were used to assess swallowing including thin liquid, nectar, honey thickened and puree consistencies. Findings:  Normal oral transit was observed. Both regular and dense barium, as well as semisolid and solid food substances, were swallowed without difficulty. There was no evidence of tracheal aspiration. Total fluoroscopy time is 1.3 minutes     IMPRESSION: No  evidence of tracheal aspiration. For more information and dietary recommendations, please refer to the corresponding report submitted by speech pathology. Report Verified by: Particia Nearing, MD at 01/25/2022 2:55 PM EDT    X-ray Portable Chest    Result Date: 01/24/2022  EXAM: XR PORTABLE CHEST INDICATION: Fever, unspecified TECHNIQUE: 1 view of the chest. COMPARISON: Chest radiograph January 24, 2022 FINDINGS: Medical Devices: None. Heart and Mediastinum: Cardiac size is partially obscured, but likely stable. Lungs and Pleura: Left basilar pleural-parenchymal opacities are slightly improved. Biapical scarring. Slightly increased patchy opacity in the right mid and upper lung zone. Small right pleural effusion suspected. No evidence of pneumothorax. Bones and soft tissues: No acute abnormalities.     IMPRESSION: 1.  Slightly increased patchy opacities in the right mid and lower lung zone may relate to pneumonia or aspiration. 2.  Mild improvement of left basilar pleural-parenchymal opacities likely representing a left pleural effusion with associated parenchymal disease. Report Verified by: Dagmar Hait, MD at 01/24/2022 5:14 PM EDT    X-ray Portable Chest    Result Date: 01/03/2022  EXAM: XR PORTABLE CHEST INDICATION: Chest pain, unspecified TECHNIQUE: 1 view of the chest. COMPARISON: 12/26/2021 FINDINGS: Medical Devices: None. Heart and Mediastinum: Cardiomediastinal silhouette is within normal limits. Lungs and Pleura: Patchy bilateral  airspace disease greatest at the left lung base with small left pleural effusion. No pneumothorax.. No evidence for pneumothorax. Bones and soft tissues: No acute abnormalities.     IMPRESSION: Patchy airspace disease greatest at the left lung base which may reflect aspiration or pneumonia. Report Verified by: Zane Herald, MD at 01/03/2022 4:06 PM EDT      Other

## 2022-01-27 NOTE — Unmapped (Signed)
Occupational Therapy  Treatment and Tentative Discharge     Name: Michelle Ponce  DOB: Mar 04, 1951  Attending Physician: Kirtland Bouchard, MD  Admission Diagnosis: Complicated UTI (urinary tract infection) [N39.0]  Pneumonia of both lungs due to infectious organism, unspecified part of lung [J18.9]  Sepsis with encephalopathy without septic shock, due to unspecified organism (CMS-HCC) [A41.9, R65.20, G93.40]  Date: 01/27/2022  Reviewed Pertinent hospital course: Yes   Hospital Course PT/OT: Pt is a 71 y/o female presenting with SOB and lethargy, dx: sepsis, PNA, UTI. Pt with recent hip fx s/p repair on 3/14 at outside hospital. [facility]) Pt sustained a mechanical fall, on 12/14/2021 and she was found to have an intertrochanteric fracture of the left hip and s/p left hip hemiarthroplasty,S/p PEG placement 3/16 at [facility])  Relevant PMH : hip fx s/p repair, legally blind, dysphagia w/ g-tube placement  Precautions: FWB LLE per care-everwhere (hemi-arthroplasty listed, but assuming posterior approach); Fall precautions; (PEG tube); Oxygen Saturation >90%  Activity Level: Activity as tolerated      Assessment  OT 6 Clicks  Help From Another Person To Put On/Take Off Regular Lower Body Clothing: Total  Help From Another Person Bathing ( including washing ,rinsing,drying): Total  Help From Another Person Toileting, which includes using the toilet, bedpan or urinal: Total  Help From Another Person To Put On/Take Off Regular Upper Body Clothing: Total  Help From Another Person Taking Care Of Personal Grooming: A lot  Help From Another Person Eating Meals: Total  OT 6 Clicks Score: 7  Assessment: Decreased ADL status, Decreased UE strength, Decreased UE ROM, Decreased Safe judgment during ADL, Decreased cognition, Decreased activity tolerance, Decreased self-care transfers, Decreased IADLs, Decreased Balance, Decreased Functional Mobility                Prognosis for OT goals: Guarded                                        Goals  Goals to be met by: 02/02/22  Patient stated goal: patient unable to state  Patient will complete supine to sit in prep for ADLs: Maximum assistance (of 2)  Patient will complete grooming task: Stand-by assistance  Patient will complete upper body dressing: Moderate assistance  Patient will complete upper body bathing : Moderate assistance  Pt Will participate in upper extremity HEP to prep for ADLs: Goal addressed 4/26 - recommend continuing goal  Collaborated with: Patient    Recommendation  Plan  Treatment Interventions: ADL retraining, Functional transfer training, UE strengthening/ROM, Activity Tolerance training, Therapeutic Activity, Excercise, Compensatory technique education  Progress: Slow progress, decreased activity tolerance  OT Frequency: minimum 2x/week  Recommendation  Recommendation: Short-term skilled OT  Equipment Recommendations: Defer at this time    Problem List  Patient Active Problem List   Diagnosis   ??? Disorientation   ??? Aspiration into respiratory tract   ??? Moderate protein-calorie malnutrition (CMS-HCC)        Past Medical History  Past Medical History:   Diagnosis Date   ??? Hypercholesteremia    ??? Hypertension    ??? Stroke (CMS-HCC)         Past Surgical History  History reviewed. No pertinent surgical history.      Cognition  Overall Cognitive Status: Impaired  Arousal/Alertness: Alert  Orientation Level: Oriented X4  Following Commands: Follows one step commands;Requires repetition of instruction;Requires verbal cues  Comments: improved alertness and command following    Pain  Pain Score: 0 - No Pain  Pain Location: Hip  Pain Descriptors: Aching  Pain Intervention(s): Repositioned;Ambulation/increased activity  Therapist reported pain to: RN-Jenn notified          Exercises  Bilateral upper extremity range of motion exercises (active assist)  1 set x 10 reps:  Shoulder flexion  Shoulder horizontal abduction  Shoulder internal/external rotation  Elbow  flexion/extension  Supination/pronation  Composite grip squeezes    Therapist provided verbal/tactile cues and visual demonstration throughout for proper body mechanics.         Position after Treatment and Safety Handoff  Position after therapy session: Bed  Details: RN notified;Call light/ needs within reach  Alarms: Bed  Alarms Status: Activated and Interfaced with call system      Patient Education  Educated pt on role of OT, UE exercises.        Tentative Discharge Summary:  Patient may discharge prior to next OT session.  1 out of 5 goals met.  Goals not met due to brevity of admit.  Recommend short term skilled OT.  Tentative d/c from Acute OT.      Time  Start Time: 1557  Stop Time: 1614  Time Calculation (min): 17 min    Charges       $Therapeutic Activity: 8-22 mins       Jens SomAddie Marjorie Deprey MS, OTR  Occupational Therapist  [place]  01/27/2022

## 2022-01-27 NOTE — Unmapped (Signed)
[facility]  Case Management/Social Work Department  Progress Note    Patient Information     Hospital day: 3  Inpatient/Observation:  Inpatient   Admit date:  01/24/2022  Admission diagnosis: Complicated UTI (urinary tract infection) [N39.0]  Pneumonia of both lungs due to infectious organism, unspecified part of lung [J18.9]  Sepsis with encephalopathy without septic shock, due to unspecified organism (CMS-HCC) [A41.9, R65.20, G93.40]    Other Pertinent Information     Hospital day #3, Sepsis with encephalopathy; Pneumonia and complicated UTI    1345 Call placed to Gen at [place]. Confirmed bed available for patient when medically ready   Discharge Plan     Anticipated discharge plan:  From [facility], plan/can return, stretcher transport     Discharge barriers: none      Anticipated discharge date:  04/27      CM/SW will continue to follow and remain available until hospital discharge for discharge planning needs.      Lynett Grimes BSN, RN, CCM  [facility]   RN Case Manager  [phone]

## 2022-01-28 LAB — POC GLU MONITORING DEVICE
POC Glucose Monitoring Device: 115 mg/dL (ref 70–100)
POC Glucose Monitoring Device: 144 mg/dL (ref 70–100)
POC Glucose Monitoring Device: 127 mg/dL (ref 70–100)

## 2022-01-28 MED ORDER — levoFLOXacin (LEVAQUIN) 750 MG tablet
750 | Freq: Every day | ORAL | 0 refills | Status: AC
Start: 2022-01-28 — End: 2022-02-01

## 2022-01-28 MED FILL — ATORVASTATIN 10 MG TABLET: 10 10 MG | ORAL | Qty: 1

## 2022-01-28 MED FILL — MIRTAZAPINE 15 MG TABLET: 15 15 MG | ORAL | Qty: 1

## 2022-01-28 MED FILL — NEBUSAL 3 % SOLUTION FOR NEBULIZATION: 3 3 % | RESPIRATORY_TRACT | Qty: 4

## 2022-01-28 MED FILL — LEVOFLOXACIN 750 MG TABLET: 750 750 MG | ORAL | Qty: 1

## 2022-01-28 MED FILL — ENOXAPARIN 30 MG/0.3 ML SUBCUTANEOUS SYRINGE: 30 30 mg/0.3 mL | SUBCUTANEOUS | Qty: 0.3

## 2022-01-28 MED FILL — AMLODIPINE 10 MG TABLET: 10 10 MG | ORAL | Qty: 1

## 2022-01-28 MED FILL — FAMOTIDINE 20 MG TABLET: 20 20 MG | ORAL | Qty: 1

## 2022-01-28 NOTE — Unmapped (Signed)
Physical Therapy                                            Treatment and Tentative Discharge    Name: Michelle Ponce  DOB: 06/07/1951  Attending Physician: Kirtland Bouchard, MD  Admission Diagnosis: Complicated UTI (urinary tract infection) [N39.0]  Pneumonia of both lungs due to infectious organism, unspecified part of lung [J18.9]  Sepsis with encephalopathy without septic shock, due to unspecified organism (CMS-HCC) [A41.9, R65.20, G93.40]  Date: 01/28/2022  Reviewed Pertinent hospital course: Yes  Hospital Course PT/OT: Pt is a 71 y/o female presenting with SOB and lethargy, dx: sepsis, PNA, UTI. Pt with recent hip fx s/p repair on 3/14 at outside hospital. [facility]) Pt sustained a mechanical fall, on 12/14/2021 and she was found to have an intertrochanteric fracture of the left hip and s/p left hip hemiarthroplasty,S/p PEG placement 3/16 at [facility])  Relevant PMH : hip fx s/p repair, legally blind, dysphagia w/ g-tube placement, CVA with L sided weakness  Precautions: FWB LLE per care-everwhere (hemi-arthroplasty listed, but assuming posterior approach); Fall precautions; (PEG tube); Oxygen Saturation >90%  Activity Level: Activity as tolerated    Assessment  PT 6 Clicks  Help From Another Person Turning From Back to Side While Flat in Bed Without Using Siderails: Total  Help From Another Person Moving From Lying On Back To Sitting Without Using Siderails: Total  Help From Another Person Moving To And From Bed To Chair: Total  Help From Another Person Standing Up From Chair Using Your Arms: Total  Help From Another Person To Walk In Hospital Room: Total  Help From Another Person Climbing 3-5 Steps With A Railing: Total  PT 6 Clicks Score: 6  Assessment: Impaired Bed Mobility, Impaired Transfers, Impaired Balance, Impaired Strength, Impaired ROM, Impaired Safety Awareness, Impaired Cognition, Deconditioning, Impaired Activity Tolerance        Prognosis: Fair      Goals  Collaborated with:  Patient  Patient Stated Goal: to decrease pain during mobility  Goals to be met by: 02/02/22  Patient will transition from supine to sit: Maximum assistance  Patient will transition from sit to supine: Maximum assistance  Patient will sit edge of bed: Minimal assistance  Miscellaneous Goal #1: Patient will be able to participated with B LE HEP with moderate assistance for increased strength and AROM while maintaining assumed posterior hip precautions on L LE.  Miscellaneous Goal #2: Patient will be able to maintain eyes open 100% of session and follow one step commands.- goal met 4/27    Recommendation  Plan  Treatment/Interventions: LE strengthening/ROM, Endurance training, Patient/family training, Therapeutic Activity, Therapeutic Exercise, Neuromuscular Reeducation  PT Frequency: minimum 2x/week    Recommendation  Recommendation: Short-term skilled PT  Equipment Recommended: Defer to facility to obtain    Problem List  Patient Active Problem List   Diagnosis   ??? Disorientation   ??? Aspiration into respiratory tract   ??? Moderate protein-calorie malnutrition (CMS-HCC)        Past Medical History  Past Medical History:   Diagnosis Date   ??? Hypercholesteremia    ??? Hypertension    ??? Stroke (CMS-HCC)         Past Surgical History  History reviewed. No pertinent surgical history.    Cognition:  Overall Cognitive Status: Impaired  Orientation Level: Oriented X4  Following Commands: Follows  one step commands     Pain:  Pain Score: 3   Pain Location: Hip  Pain Descriptors: Sore  Pain Intervention(s): Repositioned  Therapist reported pain to: RN aware         Mobility:  Bed Mobility  Unable to progress further due to: session focused on LE exercises to improve strength  Balance  Sitting - Static: Not Tested  Sitting - Dynamic: Not Tested Not Applicable     Therapeutic Ex's:   Supine exercise:  1. glut sets x 10 reps  2. ankle pumps  x 10 reps  3. quad sets  x 10 reps- decreased muslce activation in L quad   4. hip abduction   x 5 reps, 2 sets   5.  Heel Slide  x 5 reps, 2 sets   6. SAQ x 5 reps , 2 sets    exercises completed on B LEs, required Min A for movement on R LE and Mod A for movement of L LE          Position after Treatment and Safety Handoff  Position after therapy session: Bed  Details: RN notified;Call light/ needs within reach;visitor present;in left side-lying  Alarms: Bed  Alarms Status: Activated and Interfaced with call system           Patient Education  Educated patient on benefit of out of bed activity to improve overall strength and functional mobility.   LE exercises program       Time  Start Time: 1403  Stop Time: 1427  Time Calculation (min): 24 min    Charges       $Therapeutic Exercise: 23-37 mins       If the patient is discharged prior to the next PT session then this note will serve as the discharge summary and pt will be discharged with the above recommendations. The pt has met 1 out of 5 goals established to date. Goals not met due weakness and decreased activity tolerance.  If the pt remains here at [facility] then PT will continue per the PT POC.      Kathlyn [place], [place] [address]  Phone [phone]  01/28/2022

## 2022-01-28 NOTE — Unmapped (Signed)
[facility]  Discharge Summary    Patient ID:  Chirstine Defrain  [id number]  71 y.o.  [birth year]    Admit date: 01/24/2022    Discharge date and time:  01/28/2022    Admitting Physician: Newman Pies, MD     Discharge Physician: Kirtland Bouchard    Discharged Condition: stable    Consults: pulmonary/intensive care    Procedures: None    Last BMP Result:      Lab 01/26/22  0357   SODIUM 135   POTASSIUM 3.8   CHLORIDE 99   CO2 29   BUN 13   CREATININE 0.25*   CALCIUM 9.0     Last CBC Result:      Lab 01/26/22  0357   WBC 6.6   HEMATOCRIT 27.9*   PLATELETS 346   RBC 2.70*     No results for input(s): PROTIME, INR in the last 72 hours.      Summary of HPI:  Pt is 71 y.o. Female with past medical history of CVA with residual left-sided weakness, dysphagia status post PEG tube, hyperlipidemia, essential hypertension, legally blind who presented to ED from the [facility] with a complaint of increasing shortness of breath and hypoxia.  Patient was found to be high short of breath at the facility with increasing lethargy.  She was also reported to have a fever and generalized weakness that started 2 days ago.  Patient was reportedly requiring supplemental oxygen up to 10 L/min prior to arrival to ED.  In ED patient was found to be hypoxic requiring nasal cannula 4 L/min.  Her chest x-ray shows right middle and right lower lobe infiltrates.  Patient was also found to be febrile in ER with temperature of 102.4 ??F.  Patient was admitted for further management of pneumonia      Hospital Course:    RML/RLL pneumonia, likely aspiration: initially on Zosyn, now changed to Levaquin, vancomycin is d/c'd, NGTD on blood cultures, MRSA screen is negative, completed MBS and cleared for puree diet, however oral intake is poor   ??  Hypoxemia: Secondary to above currently resolved and the patient is currently on NC 3 L/min, wean O2 as tolerted  ??  Pseudomonas UTI:  Zosyn changed to Levquin as above  ??  ??  Hx of CVA with residual  left-sided weakness: Continue statin,? ??Not on aspirin.  ??  Dysphagia s/p PEG tube: Tube feed is resume, completed MBS and started on puree diet, oral intake is poor  ??  Severe protein calorie malnutrition: Further management as above  ??  Anemia of chronic disease: Stable hemoglobin, monitor for now.  Hyperlipidemia: Continue statin.  ??  Essential hypertension: BP is up, started on Norvasc  ??  GERD: Continue PPI  ??  DVT prophylaxis SQ Lovenox.  ??  Dispo: likely ECF     Discharge Exam:  Vitals:    01/28/22 1200 01/28/22 1257 01/28/22 1300 01/28/22 1400   BP: 132/82  117/74 111/79   BP Location:       Patient Position:       Pulse: 117  109 110   Resp: 21  27 30    Temp:       TempSrc:       SpO2: (!) 89% 92% 92% 91%   Weight:       Height:           General: Chronically ill-appearing lady, NAD  L:CTA, no W/R/R, not labored  H:RRR, S1S2  ZOX:WRUE ND/NT, normal BS, PEG tube in place.   Ext: no edema    Disposition: SNF    Patient Instructions:      Medication List      TAKE these medications      PRN MORN NOON EVE BED   acetaminophen 325 MG tablet  Commonly known as: TYLENOL                    amLODIPine 10 MG tablet  Commonly known as: NORVASC  Take 1 tablet (10 mg total) by mouth daily.                    atorvastatin 10 MG tablet  Commonly known as: LIPITOR                    enoxaparin 30 mg/0.3 mL Syrg  Commonly known as: LOVENOX  Inject 0.3 mLs (30 mg total) subcutaneously daily.                    levoFLOXacin 750 MG tablet  Commonly known as: LEVAQUIN  Take 1 tablet (750 mg total) by mouth daily for 3 days.  Start taking on: January 29, 2022                    loratadine 10 mg tablet  Commonly known as: CLARITIN                    mirtazapine 15 MG tablet  Commonly known as: REMERON  Take 1 tablet (15 mg total) by mouth at bedtime.                    ondansetron 4 MG tablet  Commonly known as: ZOFRAN                    pantoprazole 20 MG tablet  Commonly known as: PROTONIX                    polyethylene glycol 17  gram packet  Commonly known as: MIRALAX  17 g by Per G Tube route daily.                    sertraline 25 MG tablet  Commonly known as: ZOLOFT                          Activity: activity as tolerated  Diet: TF and dysphagia diet    Follow-up with Alden Server, MD in 3-5 days.    Total time 35 min    Signed:  Remington Highbaugh  01/28/2022  2:19 PM

## 2022-01-28 NOTE — Unmapped (Signed)
Patient will discharge back to [facility]. Report is called to Nurse, [facility].   ETA for transport is 1715.

## 2022-01-28 NOTE — Unmapped (Signed)
Patient is lethargic and oriented X4, HR 110 ST, all other vitals are WNL.  Patient is transported out of facility by EMS.  patient is discharged

## 2022-01-28 NOTE — Unmapped (Signed)
CONTINUITY OF CARE FORM     Patient name: Michelle Ponce  Patient MRN: [id number]  DOB: Sep 21, 1951  Age: 71 y.o.  Gender: female    Date of admission: 01/24/2022  Date of discharge: 01/28/2022    Attending provider: Kirtland Bouchard, MD  Primary care physician: Alden Server, MD    Code status: Full Code  Allergies:   Allergies   Allergen Reactions   ??? Aspirin    ??? Atenolol    ??? Chocolate Flavor Other (See Comments)     Not specified    ??? Codeine    ??? Egg Other (See Comments)     Not specified   ??? Methylprednisone    ??? Onion Other (See Comments)     Not specified    ??? Sudafed [Pseudoephedrine Hcl]        Diagnoses Present on Admission   Primary Diagnosis: <principal problem not specified>  Discharge Diagnosis : There are no hospital problems to display for this patient.    Prognosis: fair  Rehabilitation potential: fair        Diet     Diet Orders          Dysphagia Level 1: Pureed Thin starting at 04/24 1445    Continuous tube feedings starting at 04/24 1445        Dysphagia Assessment and Recommendations (when available): Risk for Aspiration: Moderate  Aspiration Risk Recommendations: Dysphagia treatment, Modified barium swallow study  Dysphagia Diagnosis: suspect pharyngeal dysphagia, oral dysphagia  Compensatory Swallowing Strategies: Upright as possible for all oral intake, Eat/feed slowly, Small bites/sips  Dysphagia Diet Recommended (when available): Diet Solids Recommendation: Dysphagia 1  Diet Liquids Recommendations: Thin only    Continuous tube feeds of Isosource 1.5,  at 40 mL/hour     Dysphagia level I diet.     Services Required   Skilled Nursing: Yes    PT Interventions and Frequency: Treatment/Interventions: LE strengthening/ROM, Endurance training, Patient/family training, Therapeutic Activity, Therapeutic Exercise, Neuromuscular Reeducation  PT Frequency: minimum 2x/week    PT Recommendations: Recommendation: Short-term skilled PT  Equipment Recommended: Defer to facility to obtain    OT Interventions  and Frequency: Treatment Interventions: ADL retraining, Functional transfer training, UE strengthening/ROM, Activity Tolerance training, Therapeutic Activity, Excercise, Compensatory technique education  Progress: Slow progress, decreased activity tolerance  OT Frequency: minimum 2x/week    OT Recommendations: Recommendation: Short-term skilled OT  Equipment Recommendations: Defer at this time    Weight bearing status:  full    Bedside Swallow Recommendations (when available): Recommendation: SLP recommendation pending clinical progress    Speech Language Recommendations (when available):      Needs 24 hour supervision due to cognitive impairment: No    Discharge Medications   Medications:     Medication List      TAKE these medications, which are NEW      Quantity/Refills   amLODIPine 10 MG tablet  Commonly known as: NORVASC  Take 1 tablet (10 mg total) by mouth daily.   Quantity: 30 tablet  Refills: 0     levoFLOXacin 750 MG tablet  Commonly known as: LEVAQUIN  Take 1 tablet (750 mg total) by mouth daily for 3 days.  Start taking on: January 29, 2022   Refills: 0        TAKE these medication, which have CHANGED      Quantity/Refills   enoxaparin 30 mg/0.3 mL Syrg  Commonly known as: LOVENOX  Inject 0.3 mLs (30 mg total) subcutaneously daily.  What  changed:   ?? medication strength  ?? how much to take   Refills: 0        TAKE these medications, which you were ALREADY TAKING      Quantity/Refills   acetaminophen 325 MG tablet  Commonly known as: TYLENOL  2 tablets (650 mg total) by Per G Tube route every 6 hours as needed.   Refills: 0     atorvastatin 10 MG tablet  Commonly known as: LIPITOR  1 tablet (10 mg total) by Per G Tube route daily.   Refills: 0     loratadine 10 mg tablet  Commonly known as: CLARITIN  1 tablet (10 mg total) by Per G Tube route daily.   Refills: 0     mirtazapine 15 MG tablet  Commonly known as: REMERON  Take 1 tablet (15 mg total) by mouth at bedtime.   Quantity: 30 tablet  Refills: 0      ondansetron 4 MG tablet  Commonly known as: ZOFRAN  1 tablet (4 mg total) by Per G Tube route every 6 hours as needed.   Refills: 0     pantoprazole 20 MG tablet  Commonly known as: PROTONIX  1 tablet (20 mg total) every morning before breakfast.   Refills: 0     polyethylene glycol 17 gram packet  Commonly known as: MIRALAX  17 g by Per G Tube route daily.   Quantity: 14 packet  Refills: 0     sertraline 25 MG tablet  Commonly known as: ZOLOFT  1 tablet (25 mg total) by Per G Tube route daily.   Refills: 0        STOP taking these medications    AMOXicillin-clavulanate 875-125 mg per tablet  Commonly known as: AUGMENTIN     traMADoL 100 MG 24 hr tablet  Commonly known as: ULTRAM-ER           Where to Get Your Medications      Information about where to get these medications is not yet available    Ask your nurse or doctor about these medications  ?? amLODIPine 10 MG tablet  ?? enoxaparin 30 mg/0.3 mL Syrg  ?? levoFLOXacin 750 MG tablet               Discharge Specific Orders   Discharge specific orders:  RESPIRATORY:  Oxygen per nasal canula/tracheostomy at 1-2 L/min. continuous.  Wean oxygen for O2 saturations greater than 92 %    Isolation     Patient Isolation Status     None to display            Physician Certification of Medically Necessary Transportation   Type and reason for transportation: Stretcher - patient is non-ambulatory.  Reason for transport to another facility: Long term acute care need  Patient requires: Continuous medical supervision enroute    Follow-up Appointments and [facility] Discharge Physician Name   No future appointments.  CCM [facility] [facility]  [address]  [place] [address]  [phone]        Follow up with their PCP as scheduled.  No follow-ups on file.    Physician Signature and Credentials   I certify that I have reviewed the information contained herein, and that the information is a true and accurate reflection of the individual's  condition.    Discharging Physician: Electronically signed by Kirtland Bouchard, MD  01/28/2022, 1:33 PM    SOCIAL WORK DOCUMENTATION  Facility/Agency Name: Skilled Nursing Facility Name: [facility]    Number to call report: SNF Number: Report; [phone]    Level of Care at Discharge: Skilled Nursing Facility           Less than 30 day convalescent stay: Yes    PASARR/HENS 7000 Completed: N/A    Family Member Name and Relationship Notified at Discharge:      Family Contact Number:      Social Worker or Discharge Planner Name and Telephone Number: Cindi Carbon RN, MSN, [place] [phone]      NURSE DISCHARGE ASSESSMENT   Vitals:  Patient Vitals for the past 4 hrs:   BP Pulse Resp SpO2   01/28/22 1300 117/74 109 27 92 %   01/28/22 1257 -- -- -- 92 %   01/28/22 1200 132/82 117 21 (!) 89 %   01/28/22 1100 130/84 116 18 (!) 89 %   01/28/22 1000 121/77 110 28 94 %   01/28/22 0954 121/77 109 24 98 %        Orientation:       Orientation Level: Oriented X4  Patient Behaviors: Calm, Cooperative    Respiratory:     Respiratory Pattern: Regular, Easy, Unlabored  Chest Assessment: Chest expansion symmetrical, Trachea midline  Bilateral Breath Sounds: Diminished  R Breath Sounds: Diminished  L Breath Sounds: Diminished      Cardiac:  Cardiac (WDL): Within Defined Limits  Heart Sounds: S1, S2    Edema:  Peripheral Vascular (WDL): Within Defined Limits  Edema: Right upper extremity, Left upper extremity  RUE Edema: Non Pitting Edema  RLE Edema: None  LLE Edema: None    Wounds:       Comfort/Mattress:       Musculoskeletal:  Musculoskeletal (WDL): Exceptions to WDL  LUE: Limited movement  RUE: Limited movement  RLE: Limited movement  LLE: Limited movement    GI:  Gastrointestinal (WDL): Exceptions to WDL  GI Symptoms: None  Last BM Date: 01/27/22  Bowel Incontinence: Yes  Stool Source: Rectum    GU:  Genitourinary (WDL): Exceptions to WDL  Genitourinary Symptoms: None  Urinary Incontinence: No  Urine Source: Urine  Catheter    Lines and Drains:  Patient Lines/Drains/Airways Status     Active Line / PIV Line     Name Placement date Placement time Site Days    Peripheral IV 01/24/22 Anterior;Right Forearm 01/24/22  1722  Forearm  3    Peripheral IV 01/24/22 Left;Posterior Hand 01/24/22  1630  Hand  3                ADL's:  Level of Assistance: Maximum assist, patient does 25-49%  Feeding: Declined meal  Level of Assistance: Moderate assist    Morse Fall Risk  Morse Fall Risk Score: 35    Restraints:       RN to RN Handoff:  RN Giving Report:: Therapist, music Receiving Report:: Nadean Corwin  Reason for Handoff: Shift Change        Nurse and Credentials   RN Handoff Completed by: Bradly Chris on 01/28/2022

## 2022-01-28 NOTE — Unmapped (Addendum)
[place]    Case Manager/Social Worker Discharge Summary     Patient name: Michelle Ponce                                        Patient MRN: [id number]  DOB: 1951/01/25                              Age: 71 y.o.              Gender: female  Patient emergency contact: Extended Emergency Contact Information  Primary Emergency Contact: Sybert,[name]  Mobile Phone: [phone]  Relation: Spouse  Secondary Emergency Contact: Wingard,aaron  Mobile Phone: [phone]  Relation: Son  Interpreter needed? No      Attending provider: Kirtland BouchardMotaz Hossein, MD  Primary care physician: Alden ServerPaul G Jentes, MD    The MD has indicated that the patient is ready for discharge.  Michelle Ponce was referred and accepted at [facility]; [phone].  The patient will be transported by [facility] [phone] at 1715    Transfer Mode/Level of Care: Basic Life Support Stretcher (BLS)    PASRR/HENS 7000 Completed: N/A    AVS and COC have been faxed to facility.     The AVS has been reviewed:     Patient/Family Informed of Discharge Plan: Yes    Plan Reviewed With Patient, Family, or Significant Other: Yes    Patient and or family are aware and in agreement with the discharge plan: Yes    Family Member Name and Relationship Notified at Discharge: spouse at bedside        Plan reviewed with MD and other members of the health care team: Yes  Care Plan Completed: Yes    No further CM/SW needs.    This plan has been reviewed with the multi-disciplinary team.     [facility] notified of discharge today and transport at 1715.     Treatment Preferences    Treatment Preferences: Other (provide comment) (current provider)    Post-Discharge Goals    Patient's Post-Discharge goals: Return to SNF    Post Acute Care Provider Information:  Skilled Nursing Facility Name: [facility]   SNF Number: Report; [phone]      Lynett GrimesJeanne Lina Hitch BSN, RN, CCM    [facility]   RN Case Manager  [phone]

## 2024-09-02 NOTE — External Document (Addendum)
 Transcription Authentication Interface Message TextTRIHEALTH History & PhysicalBETHESDA NORTH HOSPITALPatient: Michelle Ponce MRN: [id number]  Room/Bed:  20/20Date of Birth: 1951-04-08  Age: 73 year old  Gender: femaleDate of Service:    11/30/2025Primary Care Physician:  Lalla Prentice JINNY., MDAdmitting Physician:  Francis Richter, MDHistory OF PRESENT ILLNESSAdmissions in past 12 months:       0Last hospitalization:                         03/29/2023 to 7/1/2024History of Present   IllnessThe patient is a(n) 73 year old female w history of HLD, Chronic Back Pain, CVA,HLD, who presents with abdominal distension concerning for bowel obstructionadmitted for hyponatremia. Patient sent in from SNF via EMS with concern forbowel   obstruction, imaging shows ileus She has a peg tube that reportedly hasbeen functioning normally. Her lab work up is significant for hyponatremia. Shereports that she has been having nausea, vomiting, abdominal distension withwatery muddy bowel   movements.Past Medical HistoryPast Medical History:Diagnosis Date?? Allergic rhinitis prev with allergy shot?? Chronic back pain?? CVA (cerebral vascular accident) (HCC)?? 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   Hyperlipidemia?? Leukopenia?? Osteopenia last dexa 2010?? Retinal detachment left eye blindnessPast Surgical HistoryPast Surgical History:Procedure Laterality Date?? COLONOSCOPY N/A 07/12/2022 Procedure: MAC COLONOSCOPY POSSIBLE BIOPSY AND/OR POLYPECTOMY;    Surgeon:Muddana, Lane SAILOR., MD;  Location: [facility];  Service: Gastroenterology;Laterality: N/A;  Incomplete colonoscopy?? COLONOSCOPY N/A 07/14/2022 Procedure: MAC COLONOSCOPY POSSIBLE BIOPSY AND/OR POLYPECTOMY;  Surgeon:Chitsaz, Augustine, MD;    Location: [facility];  Service: Gastroenterology;Laterality: N/A;  colonoscopy with biopsies?? PERCUTANEOUS ENDOSCOPIC GASTROSTOMY TUBE/EGD N/A 12/17/2021 Procedure: PERCUTANEOUS ENDOSCOPIC  GASTROSTOMY TUBE/EGD;  Surgeon: Lea Robins, MD;    Location: [facility];  Service: Gastroenterology;  Laterality:N/A;  EGD with PEG?? PERCUTANEOUS ENDOSCOPIC GASTROSTOMY TUBE/EGD N/A 07/20/2022 Procedure: ESOPHAGOGASTRODUODENOSCOPY WITH PERCUTANEOUS ENDOSCOPIC GASTROSTOMYTUBE EXCHANGE;  Surgeon: Von Artist, MD;  [facility]: [facility];  Service:Gastroenterology;  Laterality: N/A;  EGD with PEG tube exchange?? PERCUTANEOUS ENDOSCOPIC GASTROSTOMY TUBE/EGD N/A 03/31/2023 Procedure: ESOPHAGOGASTRODUODENOSCOPY WITH PERCUTANEOUS ENDOSCOPIC GASTROSTOMYTUBE;    Surgeon: Melisa Augustine, MD;  Location: [facility];  Service:Gastroenterology;  Laterality: N/A;  egd with peg placemant and removal ofprevious peg tube.?? REMV CATARACT INTRACAP,INSERT LENS?? RETINAL DETACHMENT SURGERY Left 2006Current   MedicationsPrior to Admission medicationsMedication Sig Start Date End Date Taking? Authorizing ProviderLORazepam (ATIVAN) 0.5 mg TABS Give 0.5 mg per gastrostomy tube every morning.Yes HISTORICAL MEDmethenamine (HIPREX) 1 g tablet 1 g 2 (two) times   daily. Via g-tubeIndications: UTI prophylaxis   Yes HISTORICAL MEDpolyethylene glycol (GLYCOLAX ,MIRALAX ) 17 g PACK Give 17 g per gastrostomy tubeevery morning.   Yes HISTORICAL MEDpotassium chloride 20 mEq/15 mL (10%) SOLN oral solution Give 30 mEq   pergastrostomy tube 2 (two) times daily. 22.5 mL BID   Yes HISTORICAL MEDpregabalin (LYRICA) 75 MG CAPS Give 75 mg per gastrostomy tube 2 (two) timesdaily.   Yes HISTORICAL MEDsennosides-docusate (SENNA S) 8.6-50 mg TABS Give 1 tablet per gastrostomy   tubeat bedtime.   Yes HISTORICAL MEDtraMADol (ULTRAM ) 50 MG TABS Give 50 mg per gastrostomy tube 3 (three) timesdaily.   Yes HISTORICAL MEDCholecalciferol (VITAMIN D3) 1.25 mg (50000 unit) capsule Give 50,000 units pergastrostomy tube once a week.   Mondays   Yes HISTORICAL MEDbalsam peru-castor oil (VENELEX) OINT Apply  topically 3 (three) times daily. [place]   Yes  HISTORICAL MEDbisacodyl (DULCOLAX) 10 MG SUPP 1 suppository by Rectal route daily as neededfor Constipation. Please give if no   bowel movement  in 24 hours 04/04/23  YesSameera, Sohini, MDacetaminophen (TYLENOL ) 500 MG TABS Give 1,000 mg per gastrostomy tube everymorning.   Yes HISTORICAL MEDacidophilus (LACTINEX) TABS Give 1 tablet per gastrostomy tube every morning.Yes HISTORICAL   MEDsucralfate (CARAFATE) 0.1 g/mL SUSP Give 1 g per gastrostomy tube 3 (three)times daily. 07/21/22  Yes Sultan, Tariq, MDamLODIPine (NORVASC ) 10 MG TABS Give 10 mg per gastrostomy tube every morning.Hold for systolic blood pressure less than 100,   diastolic blood pressure lessthan 60, or heart rate less than 60.   Yes HISTORICAL MEDphenol (CHLORASEPTIC) 1.4 % LIQD 5 mLs by Mouth/Throat route every 6 (six) hoursas needed (Sore Throat).   Yes HISTORICAL MEDferrous sulfate 325 (65 FE) MG TABS 325 mg   daily with breakfast. Via G tube.Yes HISTORICAL MEDatorvastatin (LIPITOR ) 10 MG TABS 10 mg by Per G Tube route at bedtime.   YesHISTORICAL MEDpantoprazole (PROTONIX) 40 mg delayed release oral suspension packet 40 mg everymorning. Via G tube.   Yes   HISTORICAL MEDondansetron (ZOFRAN ) 4 mg tablet 4 mg by Per G Tube route every 6 (six) hours asneeded (Nausea).   Yes HISTORICAL MEDsertraline (ZOLOFT) 25 MG TABS Give 25 mg per gastrostomy tube every morning.Yes HISTORICAL MEDloratadine (CLARITIN ) 10 MG   TABS Give 10 mg per gastrostomy tube every morning.Yes HISTORICAL MEDacetaminophen (TYLENOL ) 325 MG TABS 650 mg by Per G Tube route every 6 (six)hours as needed for Mild Pain (1-3).   Yes HISTORICAL MEDAllergiesAllergiesAllergiesAllergen Reactions?? Asa   [Aspirin] Other (See Comments)  ASA sensitive per patient?? Chocolate (Food) Palpitations?? Codeine Other (See Comments)  Stays away from codeine?? Eggs (Food) Unknown?? Methylprednisolone Palpitations?? Motrin [Ibuprofen] Vomiting?? Onion (Food)   Nausea?? Pollen Extract Other (See Comments)??  Sudafed [Pseudoephedrine] Palpitations?? Tree Extract Other (See Comments)Social History reports that she has never smoked. She has never used smokeless tobacco. Shereports that she does not drink alcohol   and does not use drugs.Family Historyfamily history includes Arthritis in her mother and sister; Cancer in herbrother; Depression in her father; Diabetes in her father and mother; HeartDisease in her father; High BP in her father; High Cholesterol in her   father.ROS: See HPI for further details.All other systems reviewed and negative.PHYSICAL EXAMTemp:  [98  F (36.7  C)] 98  F (36.7  C)Pulse:  [97] 97Resp:  [18] 18BP: (125)/(74) 125/74SpO2:  [95 %] 95 %O2 Device: --O2 Flow Rate (L/min): --O2 (%):   --Estimated body mass index is 20.92 kg/m?? as calculated from the following:  Height as of 03/30/23: 66.5 (168.9 cm).  Weight as of 04/03/23: 131 lb 9.6 oz (59.7 kg).Physical Exam-Constitutional:  chronically ill appearing older female, No acute   distress,non-toxic appearance.Cardiovascular:  Normal heart rate, normal rhythm, no murmurs, no rubs, nogallops.Respiratory:  Symmetric breath sounds, no respiratory distress.GI:  distended abd, no bowel soundsExtremities:  No edema.Neurologic:    Functional quadriplegiaPsychiatric:  Awake, alert, speech is clear and coherent.Labs, Imaging, & OTHER STUDIESRecent Results (from the past 24 hours)CBC w/ Diff Collection Time: 09/02/24  1:41 PMResult Value Ref Range WBC 8.9 3.6 - 10.5 THOU/mcL RBC 4.24   3.80 - 5.20 MIL/mcL HEMOGLOBIN 14.0 12.0 - 15.2 g/dL HEMATOCRIT 60.5 36 - 46 % MCV 92.9 82 - 97 fL MCH 33.1 (H) 27 - 33 pg MCHC 35.6 32 - 36 g/dL RDW 87.0 87.6 - 82.9 % PLATELET 204 140 - 375 THOU/mcL MPV 7.6 7.0 - 11.5 fL ABS. NEUTROPHIL 7.10 1.80 -   7.70 THOU/mcL ABS LYMPHS 0.90 (L) 1.00 - 4.00 THOU/mcL ABS MONOS 0.90 0.20 -  0.90 THOU/mcL ABS EOS 0.00 (L) 0.03 - 0.45 THOU/mcL ABS BASOS 0.00 0.00 - 0.20 THOU/mcL SEGS 79 % LYMPHOCYTES 10 % MONOCYTES 11 % EOSINOPHIL 0 % BASOPHILS  0 %BAMP   (Na,K,Cl,CO2,Glu,BUN,Creat,Ca) Collection Time: 09/02/24  1:41 PMResult Value Ref Range BLD UREA NITROGEN 10 8 - 26 mg/dL SODIUM 875 (LL) 864 - 854 mmol/L POTASSIUM 4.3 3.6 - 5.1 mmol/L CHLORIDE 95 (L) 98 - 111 mmol/L CO2 23 21 - 31 mmol/L GLUCOSE,   RANDOM 112 (H) 70 - 99 mg/dL CREATININE 9.57 (L) 9.39 - 1.20 mg/dL ANION GAP 6 4 - 16 mmol/L CALCIUM 9.1 8.5 - 10.4 mg/dL ESTIMATED GFR 896 >40 fO/fpw/8.26 m2Hepatic Function Panel Collection Time: 09/02/24  1:41 PMResult Value Ref Range ALK PHOSPHATASE   73 35 - 135 IU/L TOTAL PROTEIN 7.2 6.0 - 8.0 g/dL ALBUMIN 3.8 3.5 - 5.7 g/dL AST 15 10 - 40 IU/L ALT 8 (L) 10 - 60 IU/L TOTAL BILIRUBIN 0.6 0.0 - 1.2 mg/dL DIRECT BILIRUBIN 0.1 0.0 - 0.2 mg/dLLipase Collection Time: 09/02/24  1:41 PMResult Value Ref   Range LIPASE 17 11 - 82 U/LCT ABDOMEN PELVIS W CONTRASTResult Date: 11/30/20251.  Air-fluid levels within small bowel and colon consistent with an ileus. Noobstructing mass. 2.  Small left pleural effusion and minimal left   basilaratelectasis.AssessmentPrincipal Problem:  HyponatremiaActive Problems:  Essential hypertension  Hyperlipidemia  Severe protein-calorie malnutrition Gregoria: less than 60% of standard weight)  Functional quadriplegia (HCC)  History of stroke with   residual effects  Ileus (HCC)PLAN48 year old female w history of HLD, Chronic Back Pain, CVA, HLD, who presentswith abdominal distension concerning for bowel obstruction admitted forhyponatremia.HyponatremiaIleusDiarrheaHx CVAChronic painSevere protein   calorie malnutritionRemote teleIVFNpo, may benefit from NG tubeIV PPISurgery consultMonitor sodium, consult nephrologyR/o c diff, check gi pathogen panelPrn med for pain, nausea, vomitingDVT prophylaxis: Intermediate to High risk on  mechanical   prophylaxisElectronically signed by: Francis Richter, MD, 09/02/2024 3:42 PMPlease note that some or all of this record was generated using voicerecognition software. If there are any questions about  the content of thisdocument, please contact the author as   some errors of transcription may haveoccurred.

## 2024-09-02 NOTE — External Document (Signed)
 Transcription Authentication Interface Message TextCURRENT STATUS IS INPATIENT . :Any questions regarding PATIENT CLASS/STATUS should be directed to the CareManagement Departments:[facility] [phone] or [facility]   [phone] [facility]  [phone].

## 2024-09-02 NOTE — External Document (Signed)
 Transcription Authentication Interface Message Text--------------------------------------------------------------------------------Attestation signed by Beverley Greig Norris, DO at 09/02/24 1926I agree with resident assessment and plan.Amy Norris Beverley, DO7:26 PM11/30/2025[facility] ServiceDr. Petro, Dr. Carlyle, Dr. Beverley, Dr.Huber, & Dr. Levie Pass: [facility]   [facility] CONSULTAcute Care SurgeryB[facility]ESDA [facility]OSPITALPatient: Michelle Ponce MRN: [id number]  Room/Bed:  B6223A/01Date of Birth: March 21, 1951  Age: 73   year old  Gender: femaleDate of Service:  11/30/2025Admitting Physician: [facility] Care Physician:  Lalla Prentice JINNY., MDDate of Admission:  11/30/2025Admission Type: EmergencyHistoryReason for Consult:   [name] is being seen for   ileus.History of Present Illness:  Michelle Ponce is a 73 year old female with PMH of CVAwith resultant left sided weakness, malnutrition requiring PEG placement, HLD,HTN, chronic indwelling catheter, who presented from her nursing facility forabdominal   distension and nausea/ vomiting for the past day or so. Husband atbedside states that she has had an ileus a couple of times prior. She has beenhaving loose bowel movements over the past couple of weeks. She is passingflatus. She is bloated but no   abdominal pain.On admission, she was found to be hyponatremic with Na of 124. Mag/ Phos/ UYApending.Past abdominal surgeries include PEG placement.Past Medical History:Past Medical History:Diagnosis Date?? Allergic rhinitis prev with allergy shot??   Chronic back pain?? CVA (cerebral vascular accident) (HCC)?? 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 Hyperlipidemia?? Leukopenia?? Osteopenia last dexa 2010?? Retinal detachment left eye blindnessPast Surgical   History:Past  Surgical History:Procedure Laterality Date?? COLONOSCOPY N/A 07/12/2022 Procedure: MAC COLONOSCOPY POSSIBLE BIOPSY AND/OR POLYPECTOMY;  Surgeon:Muddana, Lane SAILOR., MD;  [facility];  Service: Gastroenterology;Laterality: N/A;    Incomplete colonoscopy?? COLONOSCOPY N/A 07/14/2022 Procedure: MAC COLONOSCOPY POSSIBLE BIOPSY AND/OR POLYPECTOMY;  Surgeon:Chitsaz, Augustine, MD;  [facility];  Service: Gastroenterology;Laterality: N/A;  colonoscopy with biopsies?? PERCUTANEOUS   ENDOSCOPIC GASTROSTOMY TUBE/EGD N/A 12/17/2021 Procedure: PERCUTANEOUS ENDOSCOPIC GASTROSTOMY TUBE/EGD;  Surgeon: Lea Robins, MD;  [facility];  Service: Gastroenterology;  Laterality:N/A;  EGD with PEG?? PERCUTANEOUS ENDOSCOPIC   GASTROSTOMY TUBE/EGD N/A 07/20/2022 Procedure: ESOPHAGOGASTRODUODENOSCOPY WITH PERCUTANEOUS ENDOSCOPIC GASTROSTOMYTUBE EXCHANGE;  Surgeon: Von Artist, MD;  [facility];  Service:Gastroenterology;  Laterality: N/A;  EGD with PEG tube   exchange?? PERCUTANEOUS ENDOSCOPIC GASTROSTOMY TUBE/EGD N/A 03/31/2023 Procedure: ESOPHAGOGASTRODUODENOSCOPY WITH PERCUTANEOUS ENDOSCOPIC GASTROSTOMYTUBE;  Surgeon: Melisa Augustine, MD;  [facility];  Service:Gastroenterology;  Laterality: N/A;    egd with peg placemant and removal ofprevious peg tube.?? REMV CATARACT INTRACAP,INSERT LENS?? RETINAL DETACHMENT SURGERY Left 2006Social History:Social HistoryTobacco Use?? Smoking status: Never?? Smokeless tobacco: NeverSubstance Use Topics?? Alcohol   use: No  Alcohol/week: 0.0 standard drinks of alcoholFamily History:Family HistoryProblem Relation Age of Onset?? Diabetes Father?? Heart Disease Father     75?? High Cholesterol Father?? High BP Father?? Depression Father?? Diabetes Mother?? Arthritis   Mother?? Arthritis Sister?? Cancer Brother     jaw?? Thyroid Disease Neg Hx?? Stroke Neg Hx?? Asthma Neg Hx?? Blood Disorder Neg Hx?? Anemia Neg Hx?? Kidney Disease Neg Hx?? Alzheimer's  disease Neg Hx?? Osteoporosis Neg Hx?? Adoption Neg Hx?? No Known   Medical Problem Neg Hx?? CHF Neg Hx?? Seizures Neg HxAllergies:AllergiesAllergiesAllergen Reactions?? Asa [Aspirin] Other (See Comments)  ASA sensitive per patient?? Chocolate (Food) Palpitations?? Codeine Other (See Comments)  Stays away from codeine??   Eggs (Food) Unknown?? Methylprednisolone Palpitations?? Motrin [Ibuprofen] Vomiting?? Onion (Food) Nausea?? Pollen Extract Other (See Comments)?? Sudafed [Pseudoephedrine] Palpitations?? Tree Extract Other (See Comments)Home Medications:Prior to   Admission medicationsMedication Sig  Start Date End Date Taking? Authorizing ProviderLORazepam (ATIVAN) 0.5 mg TABS Give 0.5 mg per gastrostomy tube every morning.Yes HISTORICAL MEDmethenamine (HIPREX) 1 g tablet 1 g 2 (two) times daily. Via   g-tubeIndications: UTI prophylaxis   Yes HISTORICAL MEDpolyethylene glycol (GLYCOLAX ,MIRALAX ) 17 g PACK Give 17 g per gastrostomy tubeevery morning.   Yes HISTORICAL MEDpotassium chloride 20 mEq/15 mL (10%) SOLN oral solution Give 30 mEq pergastrostomy   tube 2 (two) times daily. 22.5 mL BID   Yes HISTORICAL MEDpregabalin (LYRICA) 75 MG CAPS Give 75 mg per gastrostomy tube 2 (two) timesdaily.   Yes HISTORICAL MEDsennosides-docusate (SENNA S) 8.6-50 mg TABS Give 1 tablet per gastrostomy tubeat bedtime.     Yes HISTORICAL MEDtraMADol (ULTRAM ) 50 MG TABS Give 50 mg per gastrostomy tube 3 (three) timesdaily.   Yes HISTORICAL MEDCholecalciferol (VITAMIN D3) 1.25 mg (50000 unit) capsule Give 50,000 units pergastrostomy tube once a week. Mondays   Yes HISTORICAL   MEDbalsam peru-castor oil (VENELEX) OINT Apply  topically 3 (three) times daily. [place]   Yes HISTORICAL MEDbisacodyl (DULCOLAX) 10 MG SUPP 1 suppository by Rectal route daily as neededfor Constipation. Please give if no bowel movement in 24 hours   04/04/23  YesSameera, Sohini, MDacetaminophen (TYLENOL ) 500 MG TABS Give 1,000 mg per gastrostomy tube  everymorning.   Yes HISTORICAL MEDacidophilus (LACTINEX) TABS Give 1 tablet per gastrostomy tube every morning.Yes HISTORICAL MEDsucralfate (CARAFATE)   0.1 g/mL SUSP Give 1 g per gastrostomy tube 3 (three)times daily. 07/21/22  Yes Sultan, Tariq, MDamLODIPine (NORVASC ) 10 MG TABS Give 10 mg per gastrostomy tube every morning.Hold for systolic blood pressure less than 100, diastolic blood pressure   lessthan 60, or heart rate less than 60.   Yes HISTORICAL MEDphenol (CHLORASEPTIC) 1.4 % LIQD 5 mLs by Mouth/Throat route every 6 (six) hoursas needed (Sore Throat).   Yes HISTORICAL MEDferrous sulfate 325 (65 FE) MG TABS 325 mg daily with breakfast. Via   G tube.Yes HISTORICAL MEDatorvastatin (LIPITOR ) 10 MG TABS 10 mg by Per G Tube route at bedtime.   YesHISTORICAL MEDpantoprazole (PROTONIX) 40 mg delayed release oral suspension packet 40 mg everymorning. Via G tube.   Yes HISTORICAL MEDondansetron   (ZOFRAN ) 4 mg tablet 4 mg by Per G Tube route every 6 (six) hours asneeded (Nausea).   Yes HISTORICAL MEDsertraline (ZOLOFT) 25 MG TABS Give 25 mg per gastrostomy tube every morning.Yes HISTORICAL MEDloratadine (CLARITIN ) 10 MG TABS Give 10 mg per   gastrostomy tube every morning.Yes HISTORICAL MEDacetaminophen (TYLENOL ) 325 MG TABS 650 mg by Per G Tube route every 6 (six)hours as needed for Mild Pain (1-3).   Yes HISTORICAL MEDCurrent Medications:?? pantoprazole  40 mg Intravenous BID AC?? [START   ON 09/03/2024] LORazepam  0.5 mg Per G Tube QAM?? sucralfate  1 g Per G Tube TID?? traMADol   50 mg Per G Tube TID?? [START ON 09/03/2024] sertraline  25 mg Per G Tube QAM?? pregabalin  75 mg Per G Tube BID?? atorvastatin   10 mg Per G Tube At Bedtime??   [START ON 09/03/2024] amLODIPine   10 mg Per G Tube QAM?? [START ON 09/03/2024] ergocalciferol  50,000 unit Per NG Tube WeeklyContinuous Infusions:?? sodium chloride  100 mL/hr (09/02/24 1828)SUBJECTIVEROS:Constitutional: generally weakENT: no hearing loss,   tinnitus, vertigo,  nosebleed, nasal congestion,rhinorrhea, sore throat, eye pain, eye itching, redness or visual disturbanceRespiratory: no cough, pleuritic chest pain, dyspnea, or wheezingCardiac: no angina, DOE, orthopnea, PND, palpitations, or   claudicationGI: nauseaGU: no urinary urgency, frequency, dysuria, nocturia, hesitancy, or incontinenceSkin: no abnormal  pigmentation, rash, scaling, itching, masses, hair or nailchangesMusculoskeletal: no arthritis, arthralgia, myalgia, weakness, or   morningstiffnessNeuro: no paralysis, paresis, paresthesia, seizures, tremors, or headachesOBJECTIVEVitals:Temp:  [98  F (36.7  C)-98.5  F (36.9  C)] 98.5  F (36.9  C)Pulse:  [84-97] 84Resp:  [18-23] 20BP: (125-127)/(72-74) 127/72SpO2:  [95 %-98 %] 96 %O2   Device: --O2 Flow Rate (L/min): --O2 (%): --Arterial line: --AFP:Zdupfjuzi body mass index is 20.92 kg/m?? as calculated from the following:  Height as of 03/30/23: 66.5 (168.9 cm).  Weight as of 04/03/23: 131 lb 9.6 oz (59.7 kg).PHYSICAL EXAMGeneral   Appearance: alert, cooperative, in no distress; appears stated ageHEENT: normocephalic, without obvious abnormalityNeck: suppleLung: Respirations unlaboredCardiac: regular rate and rhythmAbdomen: soft, distended, tympanic, PEG in place,   nontenderMusculoskeletal: grossly normal, no specific joint complaintsNeuro: mental status, speech normal, alert and oriented x3Skin: grossly normal, color normal, warm and dryPain Management: Pain Management per Attending Provider.Intake/Output:Date   09/01/24 0700 - 09/02/24 0659(Not Admitted) 09/02/24 0700 - 09/03/24 0659Shift 0700-1900 1901-0659 24 Hour Total 0700-1900 1901-0659 24 Hour TotalINTAKEShift TotalOUTPUTUrine    1050  1050  Urine    1050  1050Shift Total    1050  1050NET    -1050    -1050Weight (kg)LDAs:Patient Lines/Drains/Airways Status Active LINES/DRAINS/AIRWAYS NoneLabs/Cultures:Recent Results (from the past 24 hours)CBC w/ Diff Collection Time: 09/02/24  1:41 PMResult Value Ref Range WBC  8.9 3.6 - 10.5 THOU/mcL RBC 4.24 3.80 -   5.20 MIL/mcL HEMOGLOBIN 14.0 12.0 - 15.2 g/dL HEMATOCRIT 60.5 36 - 46 % MCV 92.9 82 - 97 fL MCH 33.1 (H) 27 - 33 pg MCHC 35.6 32 - 36 g/dL RDW 87.0 87.6 - 82.9 % PLATELET 204 140 - 375 THOU/mcL MPV 7.6 7.0 - 11.5 fL ABS. NEUTROPHIL 7.10 1.80 - 7.70   THOU/mcL ABS LYMPHS 0.90 (L) 1.00 - 4.00 THOU/mcL ABS MONOS 0.90 0.20 - 0.90 THOU/mcL ABS EOS 0.00 (L) 0.03 - 0.45 THOU/mcL ABS BASOS 0.00 0.00 - 0.20 THOU/mcL SEGS 79 % LYMPHOCYTES 10 % MONOCYTES 11 % EOSINOPHIL 0 % BASOPHILS 0 %BAMP   (Na,K,Cl,CO2,Glu,BUN,Creat,Ca) Collection Time: 09/02/24  1:41 PMResult Value Ref Range BLD UREA NITROGEN 10 8 - 26 mg/dL SODIUM 875 (LL) 864 - 854 mmol/L POTASSIUM 4.3 3.6 - 5.1 mmol/L CHLORIDE 95 (L) 98 - 111 mmol/L CO2 23 21 - 31 mmol/L GLUCOSE,   RANDOM 112 (H) 70 - 99 mg/dL CREATININE 9.57 (L) 9.39 - 1.20 mg/dL ANION GAP 6 4 - 16 mmol/L CALCIUM 9.1 8.5 - 10.4 mg/dL ESTIMATED GFR 896 >40 fO/fpw/8.26 m2Hepatic Function Panel Collection Time: 09/02/24  1:41 PMResult Value Ref Range ALK PHOSPHATASE   73 35 - 135 IU/L TOTAL PROTEIN 7.2 6.0 - 8.0 g/dL ALBUMIN 3.8 3.5 - 5.7 g/dL AST 15 10 - 40 IU/L ALT 8 (L) 10 - 60 IU/L TOTAL BILIRUBIN 0.6 0.0 - 1.2 mg/dL DIRECT BILIRUBIN 0.1 0.0 - 0.2 mg/dLLipase Collection Time: 09/02/24  1:41 PMResult Value Ref   Range LIPASE 17 11 - 82 U/LImagingCT ABDOMEN PELVIS W CONTRASTResult Date: 11/30/20251.  Air-fluid levels within small bowel and colon consistent with an ileus. Noobstructing mass. 2.  Small left pleural effusion and minimal left basilaratelectasis.I   have reviewed the results pertinent to my consultation and results areclinically relevant and will be discussed in the plan.Assessment & PLANPrincipal Problem:  HyponatremiaActive Problems:  Essential hypertension  Hyperlipidemia  Severe protein-calorie   malnutrition Gregoria: less than 60% of standard weight)  Functional quadriplegia (HCC)  History of stroke with residual effects  Ileus (HCC)  Abdominal  distentionMs. Ponce is a 73 year old female with PMH of CVA with resultant left sidedweakness,   malnutrition requiring PEG placement, HLD, HTN, chronic indwellingcatheter, who presented from her nursing facility for abdominal distension andnausea/ vomiting for the past day or so. Surgery was consulted for assistance inmanagement of ileus.Ileus-   Recommend NPO and PEG to gravity- Recommend all necessary meds be converted to IV, hold non-necessary PO meds- IVF per primary- Correction of electrolytes for K>4, Phos >3, Mag>2- Follow up renal function panel- Follow up c diff- Follow up   UAHyponatremia- On 0.9% NaCl IVF- Follow up renal function panelsDVT prophylaxis:  per primaryNutrition/Diet: NPOTriHealth [facility] - [facility]. Petro, Dr. Carlyle, Dr. Beverley, Dr.Huber, & Dr. Levie Contact: [facility]   JuniorElectronically signed by: Sharyne Alexa Huxley, MD, 09/02/2024 6:31 PMPlease note that some or all of this record was generated using voicerecognition software. If there are any questions about the content of thisdocument, please contact the   author as some errors of transcription may haveoccurred.

## 2024-09-03 NOTE — External Document (Signed)
Transcription Authentication Interface Message TextThank you for consulting [facility] in the care of your patient.  Afull consult will follow but please call our office with any questions orconcerns at [phone] .  I can also be reached   through Voalte.  Dr. Farhan Arif

## 2024-09-03 NOTE — External Document (Signed)
 Transcription Designer, Multimedia Text                                      [id number]                             Nephrology Inpatient NoteInterval history and plan:Patient feels okay.Sodium is correcting but a little faster than I   would have decided to be.Up to 133 this morning.Stop the normal saline.  Give a D5W bolus.Will try to get the sodium controlled around 133 134.She is not a alcoholic or any other issue to be at high risk so 10 rise shouldnot be too much of a problem but   would not like it to rise any further thanthat.Follow the blood pressure trend.Avoid hypotensionAssessment :HyponatremiaSodium at the time of admission was 124.Sodium at the time of consultation is 133Etiology of hyponatremia is likely prerenal   hypovolemic hyponatremia correctingpretty quickly with the fluids.Patient also on Zoloft at homeHypertensionIleusOn behalf of [facility] I would like to thank Robinson Eck, MD  foropportunity to serve this patientPlease call with questions   at-24 hrs Answering service [phone]- 5pm- Voalte or Cell phoneFarhan A. Belita, MDCC/Reason for consult:HyponatremiaHPI:Michelle Ponce is a 73 year old female with past medical history significantfor hyperlipidemia, chronic back pain, CVA,   hyperlipidemia presented to thehospital with ileus and question of bowel obstruction.Patient was admitted for further evaluation.At the time of admission she is also found to have significant hyponatremia.Given the patient's hyponatremia nephrology has   been consulted to assist withcarePMH/SH/FH:Past Medical History:Diagnosis Date Allergic rhinitis prev with allergy shot Chronic back pain CVA (cerebral vascular accident) [facility]) Hemorrhagic cerebrovascular accident (CVA) (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   Ruptured blood vessel   Hyperlipidemia Leukopenia Osteopenia last dexa 2010 Retinal detachment left eye blindnessPast Surgical History:Procedure Laterality Date COLONOSCOPY N/A 07/12/2022 Procedure: MAC  COLONOSCOPY POSSIBLE BIOPSY AND/OR POLYPECTOMY;  Surgeon:Muddana, Lane SAILOR., MD;  [facility];  Service: Gastroenterology;Laterality: N/A;  Incomplete colonoscopy COLONOSCOPY N/A 07/14/2022 Procedure: MAC COLONOSCOPY POSSIBLE BIOPSY AND/OR POLYPECTOMY;  Surgeon:Chitsaz, Augustine, MD;  [facility];  Service:   Gastroenterology;Laterality: N/A;  colonoscopy with biopsies PERCUTANEOUS ENDOSCOPIC GASTROSTOMY TUBE/EGD N/A 12/17/2021 Procedure: PERCUTANEOUS ENDOSCOPIC GASTROSTOMY TUBE/EGD;  Surgeon: Lea Robins, MD;  [facility];  Service:   Gastroenterology;  Laterality:N/A;  EGD with PEG PERCUTANEOUS ENDOSCOPIC GASTROSTOMY TUBE/EGD N/A 07/20/2022 Procedure: ESOPHAGOGASTRODUODENOSCOPY WITH PERCUTANEOUS ENDOSCOPIC GASTROSTOMYTUBE EXCHANGE;  Surgeon: Von Artist, MD;  [facility];    Service:Gastroenterology;  Laterality: N/A;  EGD with PEG tube exchange PERCUTANEOUS ENDOSCOPIC GASTROSTOMY TUBE/EGD N/A 03/31/2023 Procedure: ESOPHAGOGASTRODUODENOSCOPY WITH PERCUTANEOUS ENDOSCOPIC GASTROSTOMYTUBE;  Surgeon: Melisa Augustine, MD;    [facility];  Service:Gastroenterology;  Laterality: N/A;  egd with peg placemant and removal ofprevious peg tube. REMV CATARACT INTRACAP,INSERT LENS RETINAL DETACHMENT SURGERY Left 2006Social HistorySocioeconomic History Marital status:   Married  Spouse name: Not on file Number of children: Not on file Years of education: Not on file Highest education level: Not on fileOccupational History Not on fileTobacco Use Smoking status: Never Smokeless tobacco: NeverVaping Use Vaping status:   Never UsedSubstance and Sexual Activity Alcohol use: No  Alcohol/week: 0.0 standard drinks of alcohol Drug use: Never Sexual activity: Not CurrentlyOther Topics Concern Military Service No Blood Transfusions No Caffeine Concern No Occupational Exposure   No  Comment:  Homemaker Hobby Hazards No Sleep Concern Yes  Comment:  Difficulty sleeping   post nasal  drip  Left leg restlessness Stress Concern No Weight Concern No Special Diet No Back Care No Exercise No Bike Helmet No Seat Belt Yes Self-Exams No   Appropriate use of safety belts in car? Yes Do you perform a monthly self breast exam? Yes Do you take any type medication for Osteoporosis? No Do you exercise? No Have you been given advanced directives? NoSocial History Narrative Not on fileSocial   Drivers of HealthFinancial Resource Strain: Not on fileFood Insecurity: Not At Risk (09/02/2024) Food Insecurities  Worried about running out of food: No  Food Bought: NoTransportation Needs: Not At Risk (09/02/2024) Transportation  Worried about   transportation: NoPhysical Activity: Inactive (06/27/2019) Exercise Vital Sign  Days of Exercise per Week: 0 days  Minutes of Exercise per Session: 0 minStress: No Stress Concern Present (06/27/2019) [employer] of Occupational Health - Occupational   Stress Questionnaire  Feeling of Stress : Not at allSocial Connections: Not on fileIntimate Partner Violence: Not At Risk (09/02/2024) Interpersonal Safety  Feel physically or emotionally unsafe where currently live: No  Harm by anyone: No  Emotionally   Harmed: NoHousing Stability: Not At Risk (09/02/2024) Housing/Utilities  Worried about losing home: No  Stayed outside house: No  Unable to get utilities: NoFamily HistoryProblem Relation Age of Onset Diabetes Father Heart Disease Father     77 High   Cholesterol Father High BP Father Depression Father Diabetes Mother Arthritis Mother Arthritis Sister Cancer Brother     jaw Thyroid Disease Neg Hx Stroke Neg Hx Asthma Neg Hx Blood Disorder Neg Hx Anemia Neg Hx Kidney Disease Neg Hx Alzheimer's disease   Neg Hx Osteoporosis Neg Hx Adoption Neg Hx No Known Medical Problem Neg Hx CHF Neg Hx Seizures Neg HxI and O:Intake/Output Summary (Last 24 hours) at 09/03/2024 1338Last data filed at 09/03/2024 0432Gross per 24 hourIntake --Output 1450 mlNet -1450   mlVitals:BP (!) 150/80  (Patient Position: Lying)   Pulse 114   Temp 97.9  F (36.6  C)(Oral)   Resp 16   SpO2 98%Physical Examination:General appearance: awakeNeck : JVDRespiratory: Respiratory effort not laboredCardiovascular: Ausculation- S1S2,   Edema noneAbdomen: SoftMedication: cefTRIAXone   1 g Intravenous Q24H chlorhexidine gluconate   Topical Daily sodium chloride  (PF)  10 mL Intravenous 2 times per day potassium chloride   10 mEq Intravenous Q1H enoxaparin   40 mg Subcutaneous Q24H   pantoprazole  40 mg Intravenous BID ACAsa [aspirin], Chocolate (food), Codeine, Eggs (food), Methylprednisolone,Motrin [ibuprofen], Onion (food), Pollen extract, Sudafed [pseudoephedrine], andTree extractLABS:Recent Labs  11/30/251341 12/01/250556 WBC 8.9   8.4HGB 14.0 12.9HCT 39.4 37.0PLT 204 199Recent Labs  11/30/251802 11/30/252158 12/01/250556 12/01/251200 NA 126* 129* 133* 134*K 4.3 4.5 3.9 3.3*CL 95* 97* 101 102CO2 25 24 25  24BUN 9 8 9  9CRET 0.41* 0.38* 0.37* 0.30*GLU 107* 108* 104* 110*MG 2.0  --    2.1  --CA 9.2 9.4 8.8 8.9 Disclaimer:  This note was dictated via the Dragon Naturally Speakingapplication at point of care. A reasonable attempt at proofreading has beenmade.  Nonetheless, incidental transcription errors may occur.
